# Patient Record
Sex: Male | Born: 1950 | Race: White | Hispanic: No | Marital: Married | Smoking: Former smoker
Health system: Southern US, Community
[De-identification: ages and names within clinical notes are randomized; demographics above are authoritative.]

## PROBLEM LIST (undated history)

## (undated) DIAGNOSIS — M1990 Unspecified osteoarthritis, unspecified site: Secondary | ICD-10-CM

## (undated) DIAGNOSIS — I82409 Acute embolism and thrombosis of unspecified deep veins of unspecified lower extremity: Secondary | ICD-10-CM

## (undated) DIAGNOSIS — G459 Transient cerebral ischemic attack, unspecified: Secondary | ICD-10-CM

## (undated) DIAGNOSIS — G4730 Sleep apnea, unspecified: Secondary | ICD-10-CM

## (undated) DIAGNOSIS — R569 Unspecified convulsions: Secondary | ICD-10-CM

## (undated) DIAGNOSIS — I10 Essential (primary) hypertension: Secondary | ICD-10-CM

## (undated) DIAGNOSIS — J189 Pneumonia, unspecified organism: Secondary | ICD-10-CM

## (undated) DIAGNOSIS — I639 Cerebral infarction, unspecified: Secondary | ICD-10-CM

## (undated) DIAGNOSIS — R55 Syncope and collapse: Secondary | ICD-10-CM

## (undated) DIAGNOSIS — I4892 Unspecified atrial flutter: Secondary | ICD-10-CM

## (undated) DIAGNOSIS — R0600 Dyspnea, unspecified: Secondary | ICD-10-CM

## (undated) DIAGNOSIS — N19 Unspecified kidney failure: Secondary | ICD-10-CM

## (undated) DIAGNOSIS — E7800 Pure hypercholesterolemia, unspecified: Secondary | ICD-10-CM

## (undated) DIAGNOSIS — G629 Polyneuropathy, unspecified: Secondary | ICD-10-CM

## (undated) DIAGNOSIS — R079 Chest pain, unspecified: Secondary | ICD-10-CM

## (undated) DIAGNOSIS — M436 Torticollis: Secondary | ICD-10-CM

## (undated) DIAGNOSIS — J302 Other seasonal allergic rhinitis: Secondary | ICD-10-CM

## (undated) DIAGNOSIS — I2699 Other pulmonary embolism without acute cor pulmonale: Secondary | ICD-10-CM

## (undated) DIAGNOSIS — N400 Enlarged prostate without lower urinary tract symptoms: Secondary | ICD-10-CM

## (undated) DIAGNOSIS — H5440 Blindness, one eye, unspecified eye: Secondary | ICD-10-CM

## (undated) DIAGNOSIS — A058 Other specified bacterial foodborne intoxications: Secondary | ICD-10-CM

## (undated) DIAGNOSIS — G709 Myoneural disorder, unspecified: Secondary | ICD-10-CM

## (undated) DIAGNOSIS — R519 Headache, unspecified: Secondary | ICD-10-CM

## (undated) DIAGNOSIS — K219 Gastro-esophageal reflux disease without esophagitis: Secondary | ICD-10-CM

## (undated) DIAGNOSIS — I5032 Chronic diastolic (congestive) heart failure: Secondary | ICD-10-CM

## (undated) DIAGNOSIS — Z972 Presence of dental prosthetic device (complete) (partial): Secondary | ICD-10-CM

## (undated) DIAGNOSIS — I499 Cardiac arrhythmia, unspecified: Secondary | ICD-10-CM

## (undated) HISTORY — DX: Gastro-esophageal reflux disease without esophagitis: K21.9

## (undated) HISTORY — PX: BACK SURGERY: SHX140

## (undated) HISTORY — DX: Essential (primary) hypertension: I10

## (undated) HISTORY — PX: EYE SURGERY: SHX253

## (undated) HISTORY — DX: Sleep apnea, unspecified: G47.30

## (undated) HISTORY — DX: Polyneuropathy, unspecified: G62.9

## (undated) HISTORY — DX: Acute embolism and thrombosis of unspecified deep veins of unspecified lower extremity: I82.409

## (undated) HISTORY — DX: Chest pain, unspecified: R07.9

## (undated) HISTORY — DX: Benign prostatic hyperplasia without lower urinary tract symptoms: N40.0

## (undated) HISTORY — DX: Other specified bacterial foodborne intoxications: A05.8

## (undated) HISTORY — DX: Syncope and collapse: R55

## (undated) HISTORY — PX: TONSILLECTOMY: SUR1361

## (undated) HISTORY — PX: CARDIOVERSION: SHX1299

## (undated) HISTORY — PX: KNEE ARTHROSCOPY: SUR90

## (undated) HISTORY — PX: APPENDECTOMY: SHX54

## (undated) HISTORY — DX: Unspecified kidney failure: N19

## (undated) HISTORY — DX: Cardiac arrhythmia, unspecified: I49.9

## (undated) HISTORY — DX: Transient cerebral ischemic attack, unspecified: G45.9

## (undated) HISTORY — PX: CHOLECYSTECTOMY: SHX55

## (undated) HISTORY — DX: Other pulmonary embolism without acute cor pulmonale: I26.99

---

## 1998-09-24 ENCOUNTER — Ambulatory Visit (HOSPITAL_COMMUNITY): Admission: RE | Admit: 1998-09-24 | Discharge: 1998-09-24 | Payer: Self-pay | Admitting: Neurosurgery

## 1999-05-27 ENCOUNTER — Encounter: Payer: Self-pay | Admitting: Neurosurgery

## 1999-05-27 ENCOUNTER — Ambulatory Visit (HOSPITAL_COMMUNITY): Admission: RE | Admit: 1999-05-27 | Discharge: 1999-05-27 | Payer: Self-pay | Admitting: Neurosurgery

## 1999-12-28 HISTORY — PX: ROTATOR CUFF REPAIR: SHX139

## 2000-02-18 ENCOUNTER — Encounter: Payer: Self-pay | Admitting: Neurosurgery

## 2000-02-18 ENCOUNTER — Ambulatory Visit (HOSPITAL_COMMUNITY): Admission: RE | Admit: 2000-02-18 | Discharge: 2000-02-18 | Payer: Self-pay | Admitting: Neurosurgery

## 2000-04-26 ENCOUNTER — Encounter: Payer: Self-pay | Admitting: Orthopaedic Surgery

## 2000-04-26 ENCOUNTER — Encounter: Admission: RE | Admit: 2000-04-26 | Discharge: 2000-04-26 | Payer: Self-pay | Admitting: Orthopaedic Surgery

## 2000-08-15 ENCOUNTER — Encounter: Payer: Self-pay | Admitting: Neurosurgery

## 2000-08-17 ENCOUNTER — Encounter: Payer: Self-pay | Admitting: Neurosurgery

## 2000-08-17 ENCOUNTER — Inpatient Hospital Stay (HOSPITAL_COMMUNITY): Admission: RE | Admit: 2000-08-17 | Discharge: 2000-08-20 | Payer: Self-pay | Admitting: Neurosurgery

## 2000-09-01 ENCOUNTER — Encounter: Payer: Self-pay | Admitting: Neurosurgery

## 2000-09-01 ENCOUNTER — Encounter: Admission: RE | Admit: 2000-09-01 | Discharge: 2000-09-01 | Payer: Self-pay | Admitting: Neurosurgery

## 2000-10-17 ENCOUNTER — Encounter: Payer: Self-pay | Admitting: Neurosurgery

## 2000-10-17 ENCOUNTER — Ambulatory Visit (HOSPITAL_COMMUNITY): Admission: RE | Admit: 2000-10-17 | Discharge: 2000-10-17 | Payer: Self-pay | Admitting: Neurosurgery

## 2000-11-08 ENCOUNTER — Encounter: Payer: Self-pay | Admitting: Neurosurgery

## 2000-11-08 ENCOUNTER — Ambulatory Visit (HOSPITAL_COMMUNITY): Admission: RE | Admit: 2000-11-08 | Discharge: 2000-11-08 | Payer: Self-pay | Admitting: Neurosurgery

## 2000-11-25 ENCOUNTER — Encounter: Payer: Self-pay | Admitting: Neurosurgery

## 2000-11-25 ENCOUNTER — Ambulatory Visit (HOSPITAL_COMMUNITY): Admission: RE | Admit: 2000-11-25 | Discharge: 2000-11-25 | Payer: Self-pay | Admitting: Neurosurgery

## 2000-12-27 HISTORY — PX: GALLBLADDER SURGERY: SHX652

## 2004-01-28 ENCOUNTER — Inpatient Hospital Stay (HOSPITAL_BASED_OUTPATIENT_CLINIC_OR_DEPARTMENT_OTHER): Admission: RE | Admit: 2004-01-28 | Discharge: 2004-01-28 | Payer: Self-pay | Admitting: Cardiology

## 2005-02-05 ENCOUNTER — Ambulatory Visit: Payer: Self-pay | Admitting: Unknown Physician Specialty

## 2005-02-12 ENCOUNTER — Encounter: Admission: RE | Admit: 2005-02-12 | Discharge: 2005-02-12 | Payer: Self-pay | Admitting: Neurosurgery

## 2005-05-20 ENCOUNTER — Ambulatory Visit: Payer: Self-pay | Admitting: Nephrology

## 2005-05-28 ENCOUNTER — Encounter: Admission: RE | Admit: 2005-05-28 | Discharge: 2005-05-28 | Payer: Self-pay | Admitting: Neurosurgery

## 2005-09-14 ENCOUNTER — Ambulatory Visit (HOSPITAL_COMMUNITY): Admission: RE | Admit: 2005-09-14 | Discharge: 2005-09-14 | Payer: Self-pay | Admitting: Neurosurgery

## 2006-04-27 ENCOUNTER — Encounter: Payer: Self-pay | Admitting: Neurosurgery

## 2007-07-12 ENCOUNTER — Ambulatory Visit (HOSPITAL_COMMUNITY): Admission: RE | Admit: 2007-07-12 | Discharge: 2007-07-12 | Payer: Self-pay | Admitting: Neurosurgery

## 2007-10-03 ENCOUNTER — Inpatient Hospital Stay (HOSPITAL_COMMUNITY): Admission: RE | Admit: 2007-10-03 | Discharge: 2007-10-11 | Payer: Self-pay | Admitting: Neurosurgery

## 2008-06-27 ENCOUNTER — Encounter: Admission: RE | Admit: 2008-06-27 | Discharge: 2008-06-27 | Payer: Self-pay | Admitting: Neurosurgery

## 2008-09-09 ENCOUNTER — Encounter: Admission: RE | Admit: 2008-09-09 | Discharge: 2008-09-09 | Payer: Self-pay | Admitting: Neurosurgery

## 2008-12-03 ENCOUNTER — Ambulatory Visit: Payer: Self-pay | Admitting: Unknown Physician Specialty

## 2009-05-15 ENCOUNTER — Encounter: Admission: RE | Admit: 2009-05-15 | Discharge: 2009-05-15 | Payer: Self-pay | Admitting: Neurosurgery

## 2009-12-27 DIAGNOSIS — I2699 Other pulmonary embolism without acute cor pulmonale: Secondary | ICD-10-CM

## 2009-12-27 HISTORY — DX: Other pulmonary embolism without acute cor pulmonale: I26.99

## 2010-02-05 ENCOUNTER — Ambulatory Visit (HOSPITAL_COMMUNITY): Admission: RE | Admit: 2010-02-05 | Discharge: 2010-02-05 | Payer: Self-pay | Admitting: Neurosurgery

## 2010-02-09 ENCOUNTER — Ambulatory Visit: Payer: Self-pay | Admitting: Family Medicine

## 2010-02-24 DIAGNOSIS — I82409 Acute embolism and thrombosis of unspecified deep veins of unspecified lower extremity: Secondary | ICD-10-CM

## 2010-02-24 HISTORY — DX: Acute embolism and thrombosis of unspecified deep veins of unspecified lower extremity: I82.409

## 2010-02-27 ENCOUNTER — Inpatient Hospital Stay: Payer: Self-pay | Admitting: Internal Medicine

## 2010-02-27 ENCOUNTER — Ambulatory Visit: Payer: Self-pay | Admitting: Cardiovascular Disease

## 2010-03-06 ENCOUNTER — Other Ambulatory Visit: Payer: Self-pay | Admitting: Family Medicine

## 2010-03-11 ENCOUNTER — Inpatient Hospital Stay: Payer: Self-pay | Admitting: Internal Medicine

## 2011-03-12 ENCOUNTER — Ambulatory Visit: Payer: Self-pay | Admitting: Specialist

## 2011-03-12 ENCOUNTER — Encounter: Payer: Self-pay | Admitting: Cardiovascular Disease

## 2011-03-15 ENCOUNTER — Encounter: Payer: Self-pay | Admitting: Cardiovascular Disease

## 2011-03-15 ENCOUNTER — Ambulatory Visit (INDEPENDENT_AMBULATORY_CARE_PROVIDER_SITE_OTHER): Payer: Medicare Other | Admitting: Cardiovascular Disease

## 2011-03-15 ENCOUNTER — Ambulatory Visit: Payer: Federal, State, Local not specified - PPO | Admitting: Cardiovascular Disease

## 2011-03-15 DIAGNOSIS — E1165 Type 2 diabetes mellitus with hyperglycemia: Secondary | ICD-10-CM | POA: Insufficient documentation

## 2011-03-15 DIAGNOSIS — I82409 Acute embolism and thrombosis of unspecified deep veins of unspecified lower extremity: Secondary | ICD-10-CM | POA: Insufficient documentation

## 2011-03-15 DIAGNOSIS — R0989 Other specified symptoms and signs involving the circulatory and respiratory systems: Secondary | ICD-10-CM | POA: Insufficient documentation

## 2011-03-15 DIAGNOSIS — I10 Essential (primary) hypertension: Secondary | ICD-10-CM | POA: Insufficient documentation

## 2011-03-15 DIAGNOSIS — E119 Type 2 diabetes mellitus without complications: Secondary | ICD-10-CM

## 2011-03-15 DIAGNOSIS — E118 Type 2 diabetes mellitus with unspecified complications: Secondary | ICD-10-CM

## 2011-03-15 DIAGNOSIS — I8010 Phlebitis and thrombophlebitis of unspecified femoral vein: Secondary | ICD-10-CM

## 2011-03-15 DIAGNOSIS — IMO0002 Reserved for concepts with insufficient information to code with codable children: Secondary | ICD-10-CM | POA: Insufficient documentation

## 2011-03-17 ENCOUNTER — Telehealth: Payer: Self-pay | Admitting: Cardiovascular Disease

## 2011-03-17 NOTE — Telephone Encounter (Signed)
Called and spoke to pt's wife. Pt is also taking Losartan/HCTZ 100-25mg  qd and Lisinopril 40mg  qd along with the new med we started Metoprolol 50mg  1/2 tablet (1st 2 days) then whole tablet. Per Dr. Mariah Milling, instructed that pt HOLD Lisinopril and Losartan-HCTZ and continue the Metoprolol. Advised he take 1/2 tablet tomorrow AM and if his BP remains stable to incr to 1 tablet. Informed pt's wife that Dr. Mariah Milling think pt could be in a-flutter with elevated HR and he strongly recommends we try to get to Metoprolol 50mg  bid dose. Pt's wife understands and she will have pt do the above recommendation. Pt has knee surgery this Friday, he will call us tomorrow to notify us of status.

## 2011-03-17 NOTE — Telephone Encounter (Signed)
Pt started a new Beta blocker after last appt with Gollan.  BP last night was 89/43 with a HR of 86.  Pt was nearly faint.  Did not take Beta blocker this am and BP was 107/66 with a HR of 110.

## 2011-03-19 ENCOUNTER — Ambulatory Visit: Payer: Self-pay | Admitting: Specialist

## 2011-03-19 LAB — CBC
HCT: 44.9 % (ref 39.0–52.0)
Hemoglobin: 15.3 g/dL (ref 13.0–17.0)
MCHC: 34.1 g/dL (ref 30.0–36.0)
MCV: 85.6 fL (ref 78.0–100.0)
Platelets: 185 10*3/uL (ref 150–400)
RBC: 5.25 MIL/uL (ref 4.22–5.81)
RDW: 14 % (ref 11.5–15.5)
WBC: 7.4 10*3/uL (ref 4.0–10.5)

## 2011-03-19 LAB — COMPREHENSIVE METABOLIC PANEL
ALT: 36 U/L (ref 0–53)
AST: 22 U/L (ref 0–37)
Albumin: 4.2 g/dL (ref 3.5–5.2)
Alkaline Phosphatase: 110 U/L (ref 39–117)
BUN: 22 mg/dL (ref 6–23)
CO2: 32 mEq/L (ref 19–32)
Calcium: 9.1 mg/dL (ref 8.4–10.5)
Chloride: 93 mEq/L — ABNORMAL LOW (ref 96–112)
Creatinine, Ser: 1.4 mg/dL (ref 0.4–1.5)
GFR calc Af Amer: >60 mL/min (ref >60)
GFR calc non Af Amer: 52 mL/min — ABNORMAL LOW (ref >60)
Glucose, Bld: 377 mg/dL — ABNORMAL HIGH (ref 70–99)
Potassium: 4.2 mEq/L (ref 3.5–5.1)
Sodium: 133 mEq/L — ABNORMAL LOW (ref 135–145)
Total Bilirubin: 0.7 mg/dL (ref 0.3–1.2)
Total Protein: 7.2 g/dL (ref 6.0–8.3)

## 2011-03-19 LAB — URINALYSIS, ROUTINE W REFLEX MICROSCOPIC
Bilirubin Urine: NEGATIVE
Glucose, UA: >1000 mg/dL — AB
Hgb urine dipstick: NEGATIVE
Ketones, ur: NEGATIVE mg/dL
Leukocytes, UA: NEGATIVE
Nitrite: NEGATIVE
Protein, ur: NEGATIVE mg/dL
Specific Gravity, Urine: 1.01 (ref 1.005–1.030)
Urobilinogen, UA: 0.2 mg/dL (ref 0.0–1.0)
pH: 6 (ref 5.0–8.0)

## 2011-03-19 LAB — DIFFERENTIAL
Basophils Absolute: 0 10*3/uL (ref 0.0–0.1)
Basophils Relative: 1 % (ref 0–1)
Eosinophils Absolute: 0.2 10*3/uL (ref 0.0–0.7)
Eosinophils Relative: 3 % (ref 0–5)
Lymphocytes Relative: 23 % (ref 12–46)
Lymphs Abs: 1.7 10*3/uL (ref 0.7–4.0)
Monocytes Absolute: 0.6 10*3/uL (ref 0.1–1.0)
Monocytes Relative: 8 % (ref 3–12)
Neutro Abs: 4.8 10*3/uL (ref 1.7–7.7)
Neutrophils Relative %: 65 % (ref 43–77)

## 2011-03-19 LAB — PROTIME-INR
INR: 0.96 (ref 0.00–1.49)
Prothrombin Time: 12.7 seconds (ref 11.6–15.2)

## 2011-03-19 LAB — APTT: aPTT: 31 seconds (ref 24–37)

## 2011-03-19 LAB — URINE MICROSCOPIC-ADD ON

## 2011-03-25 NOTE — Assessment & Plan Note (Signed)
Summary: Per [name]/Pre-op;Sinus Tachycardia/Referred by Audrea Muscat...   Visit Type:  Initial Consult Primary Provider:  Dr. Sullivan Lone  CC:  Went for pre op for orthoscopic knee surgery and EKG was abnormal.  Denies chest pain.  [name]  History of Present Illness: Jerry Parsons is a very pleasant 60 year old gentleman with a history of multiple back surgeries, DVT, PE in March 2011 at that time with normal echocardiogram, hypertension, depression, diabetes and history of renal disease who presents for preoperative evaluation by referral from Dr. Sullivan Lone for knee arthroscopic surgery.  He reports no significant symptoms of shortness of breath, chest pain, lightheadedness. He is active though is limited by his back and hip pain when he walks too far. He is able to go shopping, do his ADLs without any difficulty. His weight has been increasing, up 20 pounds from last year. He does have trace edema in his legs though he reports this is minimal.  he reports having a significant cardiac workup in the 1990s. He had a cardiac catheterization. His wife reports he had minimal blockage.  He does report chronic sweating at nighttime which he attributes to the Vicodin.  Previous DVT last year, he did have shortness of breath. He denies shortness of breath currently.  Old echocardiogram March 2011 shows normal LV and RV systolic function, mild LVH, diastolic dysfunction, mildly dilated left atrium.  Preventive Screening-Counseling & Management  Alcohol-Tobacco     Smoking Status: quit  Caffeine-Diet-Exercise     Does Patient Exercise: no  Current Medications (verified): 1)  Nexium 40 Mg Pack (Esomeprazole Magnesium) .... One Tablet Two Times A Day 2)  Diazepam 2 Mg Tabs (Diazepam) .... Three Times A Day 3)  Fexofenadine Hcl 180 Mg Tabs (Fexofenadine Hcl) .... One Tablet Once Daily 4)  Januvia 100 Mg Tabs (Sitagliptin Phosphate) .... One Tablet Once Daily 5)  Avodart 0.5 Mg Caps (Dutasteride) .... One  Tablet Once Daily 6)  Oxycontin 40 Mg Xr12h-Tab (Oxycodone Hcl) .... Three Times A Day 7)  Aspir-Low 81 Mg Tbec (Aspirin) .... One Tablet Once Daily 8)  Actoplus Met 15-850 Mg Tabs (Pioglitazone Hcl-Metformin Hcl) .... Two Times A Day 9)  Glimepiride 2 Mg Tabs (Glimepiride) .... One Tablet Once Daily 10)  Gabapentin 300 Mg Caps (Gabapentin) .... Six Tablets Once Daily 11)  Lisinopril 40 Mg Tabs (Lisinopril) .... One Tablet Once Daily 12)  Losartan Potassium-Hctz 100-25 Mg Tabs (Losartan Potassium-Hctz) .... One Tablet Once Daily 13)  Ranitidine Hcl 300 Mg Caps (Ranitidine Hcl) .... One Tablet Once Daily 14)  Cyclobenzaprine Hcl 10 Mg Tabs (Cyclobenzaprine Hcl) .... Three Times A Day  Allergies (verified): No Known Drug Allergies  Past History:  Family History: Last updated: Apr 05, 2011 Father: Deceased; unknown Mother: Deceased; kidney cancer  Social History: Last updated: 05-Apr-2011 Retired  Married  Tobacco Use - Former.  smoked x 1 PPD x 25 years. Alcohol Use - no Regular Exercise - no-- chronic back pain  Risk Factors: Exercise: no (04-05-11)  Risk Factors: Smoking Status: quit (04-05-11)  Past Medical History: DVT March 201 pulmonary embolus  March 2011 pneumonia 2011 kidney failure 2011 diabetes hypertenion sleep apnea neuropathy GERD Depression BPH  Past Surgical History: gallbladder surgery 2002 Torn rotator cuff repair 2001 back surgery x 8  (upper x 3 & lower x 5)  Family History: Father: Deceased; unknown Mother: Deceased; kidney cancer  Social History: Retired  Married  Tobacco Use - Former.  smoked x 1 PPD x 25 years. Alcohol Use - no Regular Exercise -  no-- chronic back pain Smoking Status:  quit Does Patient Exercise:  no  Review of Systems       The patient complains of difficulty walking.  The patient denies fever, weight loss, weight gain, vision loss, decreased hearing, hoarseness, chest pain, syncope, dyspnea on exertion,  peripheral edema, prolonged cough, abdominal pain, incontinence, muscle weakness, depression, and enlarged lymph nodes.         back, knee and hip pain with walking  Vital Signs:  Patient profile:   60 year old male Height:      70 inches Weight:      286 pounds BMI:     41.19 Pulse rate:   117 / minute BP sitting:   158 / 103  (left arm) Cuff size:   large  Vitals Entered By: Bishop Dublin, CMA (March 15, 2011 12:02 PM)  Physical Exam  General:  Well developed, well nourished, in no acute distress. Head:  normocephalic and atraumatic Neck:  Neck supple, no JVD. No masses, thyromegaly or abnormal cervical nodes. Lungs:  Clear bilaterally to auscultation and percussion. Heart:  Non-displaced PMI, chest non-tender; regular rate and rhythm, S1, S2 , tachycardic, without murmurs, rubs or gallops. Carotid upstroke normal, no bruit.  Pedals normal pulses. No edema, no varicosities. Abdomen:  Bowel sounds positive; abdomen soft and non-tender without masses, mild obesity Msk:  Back normal, normal gait. Muscle strength and tone normal. Pulses:  pulses normal in all 4 extremities Extremities:  No clubbing or cyanosis. Neurologic:  Alert and oriented x 3. Skin:  Intact without lesions or rashes. Psych:  Normal affect.   Impression & Recommendations:  Problem # 1:  TACHYCARDIA (ICD-785) etiology of his tachycardia is uncertain. He certainly has had numerous surgeries to his lower back in the lumbar region as well as cervical spine. Uncertain if this is playing a role. We will try to obtain heart rate and vitals measurements over the past year from Dr. Sullivan Lone. We will start him on metoprolol tartrate 25 mg b.i.d. titrating to 50 mg b.i.d. for his surgery this coming Friday. we did mention that we would try to repeat the echocardiogram though uncertain if a slot is available in clinic tomorrow. This should not delay his surgery if we're unable to obtain this in time for surgery later this  week.  As he is asymptomatic with no known coronary artery disease, with previous echocardiogram last year which was essentially normal, he would be an acceptable risk for surgery this week with Dr. Katrinka Blazing.  Problem # 2:  HYPERTENSION, BENIGN (ICD-401.1) blood pressure is mildly elevated. We are starting metoprolol tartrate. Would continue his other current medications. If his blood pressure drops low, we could decrease the lisinopril dose.  His updated medication list for this problem includes:    Aspir-low 81 Mg Tbec (Aspirin) ..... One tablet once daily    Lisinopril 40 Mg Tabs (Lisinopril) ..... One tablet once daily    Losartan Potassium-hctz 100-25 Mg Tabs (Losartan potassium-hctz) ..... One tablet once daily    Metoprolol Tartrate 50 Mg Tabs (Metoprolol tartrate) .[name]... Take one tablet by mouth twice a day  Problem # 3:  DVT (ICD-453.40) He denies any leg swelling, no shortness of breath. Risk of DVT is low. We will try to obtain the most recent blood work from Dr. Sullivan Lone. Uncertain if a d-dimer was checked.  Problem # 4:  DM (ICD-250.00) would continue aggressive medical regimen for his diabetes. Encourage weight loss. He has had 20 pound weight  gain over the past year.  His updated medication list for this problem includes:    Januvia 100 Mg Tabs (Sitagliptin phosphate) ..... One tablet once daily    Aspir-low 81 Mg Tbec (Aspirin) ..... One tablet once daily    Actoplus Met 15-850 Mg Tabs (Pioglitazone hcl-metformin hcl) .[name]..[name] Two times a day    Glimepiride 2 Mg Tabs (Glimepiride) ..... One tablet once daily    Lisinopril 40 Mg Tabs (Lisinopril) ..... One tablet once daily    Losartan Potassium-hctz 100-25 Mg Tabs (Losartan potassium-hctz) ..... One tablet once daily  Patient Instructions: 1)  Your physician recommends that you schedule a follow-up appointment in: 1 month 2)  Your physician has recommended you make the following change in your medication: START Metoprolol  Tartrate 50mg  two times a day. FOR THE FIRST 2 DAYS, TAKE ONLY 1/2 TABLET two times a day. Prescriptions: METOPROLOL TARTRATE 50 MG TABS (METOPROLOL TARTRATE) Take one tablet by mouth twice a day  #60 x 6   Entered by:   Lanny Hurst RN   Authorized by:   Dossie Arbour MD   Signed by:   Lanny Hurst RN on 03/15/2011   Method used:   Electronically to        [facility] (retail)       [address]       [place], [place]  [address]       Ph: [phone]       Fax: [phone]   RxID:   [id number]   Appended Document: Per Madolin Twaddle/Pre-op;Sinus Tachycardia/Referred by Audrea Muscat... EKG was closely examined and showed an atrial tachycardia with ventricular rate 117 beats per minute, no significant ST changes. T-Wave abnormality in aVL.  Previous EKG dated March 16 showed atrial tachycardia with ventricular rate 118 beats per minute  These EKGs were discussed with Dr. Berton Mount to exclude an atrial flutter. Conclusion was that this was most likely an atrial tachycardia though repeat echocardiogram and followup was needed.

## 2011-03-31 ENCOUNTER — Ambulatory Visit: Payer: Federal, State, Local not specified - PPO | Admitting: Cardiovascular Disease

## 2011-03-31 ENCOUNTER — Other Ambulatory Visit: Payer: Self-pay | Admitting: Cardiovascular Disease

## 2011-03-31 DIAGNOSIS — R000 Tachycardia, unspecified: Secondary | ICD-10-CM

## 2011-04-01 ENCOUNTER — Other Ambulatory Visit (INDEPENDENT_AMBULATORY_CARE_PROVIDER_SITE_OTHER): Payer: Medicare Other | Admitting: *Deleted

## 2011-04-01 ENCOUNTER — Other Ambulatory Visit: Payer: Self-pay | Admitting: *Deleted

## 2011-04-01 DIAGNOSIS — R000 Tachycardia, unspecified: Secondary | ICD-10-CM

## 2011-04-01 DIAGNOSIS — R0602 Shortness of breath: Secondary | ICD-10-CM

## 2011-04-06 ENCOUNTER — Ambulatory Visit: Payer: Federal, State, Local not specified - PPO | Admitting: Cardiovascular Disease

## 2011-04-12 ENCOUNTER — Ambulatory Visit (INDEPENDENT_AMBULATORY_CARE_PROVIDER_SITE_OTHER): Payer: Federal, State, Local not specified - PPO | Admitting: Cardiovascular Disease

## 2011-04-12 ENCOUNTER — Encounter: Payer: Self-pay | Admitting: Cardiovascular Disease

## 2011-04-12 DIAGNOSIS — I5189 Other ill-defined heart diseases: Secondary | ICD-10-CM | POA: Insufficient documentation

## 2011-04-12 DIAGNOSIS — I519 Heart disease, unspecified: Secondary | ICD-10-CM

## 2011-04-12 DIAGNOSIS — R0989 Other specified symptoms and signs involving the circulatory and respiratory systems: Secondary | ICD-10-CM

## 2011-04-12 DIAGNOSIS — I10 Essential (primary) hypertension: Secondary | ICD-10-CM

## 2011-04-12 DIAGNOSIS — R0602 Shortness of breath: Secondary | ICD-10-CM

## 2011-04-12 DIAGNOSIS — I82409 Acute embolism and thrombosis of unspecified deep veins of unspecified lower extremity: Secondary | ICD-10-CM

## 2011-04-12 DIAGNOSIS — E119 Type 2 diabetes mellitus without complications: Secondary | ICD-10-CM

## 2011-04-12 MED ORDER — METOPROLOL TARTRATE 25 MG PO TABS: 25.0000 mg | ORAL_TABLET | Freq: Two times a day (BID) | ORAL | Status: DC

## 2011-04-12 MED ORDER — HYDROCHLOROTHIAZIDE 25 MG PO TABS: 25.0000 mg | ORAL_TABLET | Freq: Every day | ORAL | Status: DC

## 2011-04-12 NOTE — Assessment & Plan Note (Signed)
He has diastolic dysfunction on echocardiogram, otherwise echo was normal. I suspect his diastolic dysfunction could be contributing to his fluid retention. He does have Lasix that he takes p.r.n. We have suggested that he try 40 mg p.r.n. Until his breathing improves and his weight also decreases in his edema resolves.

## 2011-04-12 NOTE — Progress Notes (Signed)
   Patient ID: Jerry Parsons, male    DOB: 10/13/1951, 60 y.o.   MRN: [id number]  HPI Comments: [name] is a very pleasant 60 year old gentleman, patient of Dr. Sullivan Lone, with a history of multiple back surgeries, DVT, PE in March 2011, hypertension, depression, diabetes and history of renal disease, s/p arthroscopic knee surgery, seen Recently for preoperative evaluation, found to have tachycardia. He was started on metoprolol tartrate, titrated to 50 mg b.i.d. And he presents for routine followup.  He reports that he has had increasing shortness of breath over the past several weeks. He has had mild increased swelling in his legs and weight gain. He drinks a significant amount of water daily during the day as well as nighttime. He goes out to eat several times a week with his wife. He denies any chest pain or PND.   he reports having a significant cardiac workup in the 1990s. He had a cardiac catheterization. His wife reports he had minimal blockage.   He does report chronic sweating at nighttime which he attributes to the Vicodin.   Previous DVT last year, he did have shortness of breath. He denies shortness of breath currently.   Old echocardiogram March 2011 shows normal LV and RV systolic function, mild LVH, diastolic dysfunction, mildly dilated left atrium.  EKG shows normal sinus rhythm with rate 78 beats per minute with no significant ST or T wave changes     Review of Systems  Constitutional: Positive for unexpected weight change.  HENT: Negative.   Eyes: Negative.   Respiratory: Positive for shortness of breath.   Cardiovascular: Positive for leg swelling.  Gastrointestinal: Negative.   Musculoskeletal: Positive for back pain and arthralgias.  Skin: Negative.   Neurological: Negative.   Hematological: Negative.   Psychiatric/Behavioral: Negative.   All other systems reviewed and are negative.   BP 160/80  Pulse 78  Ht 5\' 10"  (1.778 m)  Wt 297 lb (134.718 kg)  BMI  42.61 kg/m2   Physical Exam  Nursing note and vitals reviewed. Constitutional: He is oriented to person, place, and time. He appears well-developed and well-nourished.       Obese  HENT:  Head: Normocephalic.  Nose: Nose normal.  Mouth/Throat: Oropharynx is clear and moist.  Eyes: Conjunctivae are normal. Pupils are equal, round, and reactive to light.  Neck: Normal range of motion. Neck supple. No JVD present.  Cardiovascular: Normal rate, regular rhythm, S1 normal, S2 normal, normal heart sounds and intact distal pulses.  Exam reveals no gallop and no friction rub.   No murmur heard. Pulmonary/Chest: Effort normal and breath sounds normal. No respiratory distress. He has no wheezes. He has no rales. He exhibits no tenderness.  Abdominal: Soft. Bowel sounds are normal. He exhibits no distension. There is no tenderness.  Musculoskeletal: Normal range of motion. He exhibits edema. He exhibits no tenderness.  Lymphadenopathy:    He has no cervical adenopathy.  Neurological: He is alert and oriented to person, place, and time. Coordination normal.  Skin: Skin is warm and dry. No rash noted. No erythema.  Psychiatric: He has a normal mood and affect. His behavior is normal. Judgment and thought content normal.           Assessment and Plan

## 2011-04-12 NOTE — Assessment & Plan Note (Signed)
We will start HCTZ 25 mg for systolic pressure greater than 140. He may end up needing this daily I've asked him to check his pressure regularly.

## 2011-04-12 NOTE — Assessment & Plan Note (Signed)
I do not think that his recent shortness of breath is secondary to a new DVT and PE as he does have clinical signs of heart failure. We will watch him closely in case he does not have any improvement.

## 2011-04-12 NOTE — Assessment & Plan Note (Signed)
I suspect that he received IV fluids during his orthoscopic knee surgery. He drinks a significant amount of fluid, eats out frequently. Most likely, this is an exacerbation of his diastolic dysfunction and has mild heart failure.

## 2011-04-12 NOTE — Assessment & Plan Note (Signed)
We have encouraged him to watch his diet, decrease the number of times that he eats out at restaurants.   

## 2011-04-12 NOTE — Patient Instructions (Addendum)
Please take lasix 40 mg in the AM as needed for edema or shortness of breath. Take HCTZ for high blood pressure. You can also take HCTZ 30 minutes before lasix for shortness of breath and edema. Decrease fluid intake. Watch your weight and call the office if it does not trend downwards. Follow up in one month: 05/12/11 @ 11:45am

## 2011-04-12 NOTE — Assessment & Plan Note (Signed)
His tachycardia has improved on metoprolol. He does report having fluid retention, fatigue and shortness of breath. I am uncertain if this is the medication or fluid overload. We'll decrease the metoprolol to 25 mg b.i.d..

## 2011-04-15 ENCOUNTER — Encounter: Payer: Self-pay | Admitting: Cardiovascular Disease

## 2011-04-15 ENCOUNTER — Ambulatory Visit: Payer: Federal, State, Local not specified - PPO | Admitting: Cardiovascular Disease
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
## 2011-05-11 NOTE — Discharge Summary (Signed)
NAME:  Jerry, BEVILLE NO.:  [id number]   MEDICAL RECORD NO.:  [id number]          PATIENT TYPE:  INP   LOCATION:  3036                         FACILITY:  [facility]   PHYSICIAN:  Payton Doughty, M.D.      DATE OF BIRTH:  [birth year]   DATE OF ADMISSION:  10/03/2007  DATE OF DISCHARGE:  10/11/2007                               DISCHARGE SUMMARY   ADMITTING DIAGNOSIS/PROBLEM LIST:  L3-4.   DISCHARGE DIAGNOSES:  L3-4.   PROCEDURE:  L3-4 fusion.   SURGEON:  Trey Sailors, M.D.   SERVICE:  Neurosurgery.   COMPLICATIONS:  None.   DISCHARGE STATUS:  Alive and well.   BODY OF TEXT:  A 60 year old gentleman whose history and physical is  recounted on the chart.  He is being fused at 4-5 and 5-1.  He has  developed spondylosis at 3-4.  His MR shows significant stenosis.  He is  now admitted for fusion.  After ascertaining normal laboratory values,  he went to the OR and underwent a 3-4 fusion with Ray cages and pedicle  screws.  Postoperatively, he has done reasonable well.  He was on  OxyContin prior to his operation and so pain control was significant  issue.  As his pain control improved with PCA and remaining on his  OxyContin, his level of activity increased.  Addition of Lyrica also  helped his pain control.  He did have a CSF leak that was repaired  intraoperatively.  He was kept down for a day for that and has had no  headache.  Currently, he is awake and alert with a mild amount of left  hip pain.  His incision is dry and well healing.  His strength is full  and his vital signs are stable.  He is being discharged home on Lyrica,  Percocet, OxyContin.  His followup with be in the [facility] offices in  about week for suture removal.           ______________________________  Payton Doughty, M.D.     MWR/MEDQ  D:  10/11/2007  T:  10/11/2007  Job:  [id number]

## 2011-05-11 NOTE — Op Note (Signed)
NAME:  Jerry Parsons, Jerry Parsons NO.:  [id number]   MEDICAL RECORD NO.:  [id number]          PATIENT TYPE:  INP   LOCATION:  3172                         FACILITY:  [facility]   PHYSICIAN:  Payton Doughty, M.D.      DATE OF BIRTH:  May 13, 1951   DATE OF PROCEDURE:  10/03/2007  DATE OF DISCHARGE:                               OPERATIVE REPORT   PREOPERATIVE DIAGNOSIS:  Spondylosis L3-4.   POSTOPERATIVE DIAGNOSIS:  Spondylosis L3-4.   PROCEDURE:  L3-4 laminectomy, diskectomy, posterior lumbar interbody  fusion with Ray threaded fusion cages, nonsegmental pedicle screw  fixation, and posterolateral arthrodesis.   SURGEON:  Payton Doughty, M.D.   ANESTHESIA:  General endotracheal.   PREP:  Sterile Betadine prep and scrub with alcohol wipe.   COMPLICATIONS:  Dural tear which was primarily repaired.   NURSE ASSISTANT:  [place].   DOCTOR ASSISTANT:  Hewitt Shorts, M.D.   BODY OF TEXT:  60 year old gentleman with severe spondylosis.  He had a  prior fusion at 4-5 and 5-1.  Taken to the operating room, smoothly  anesthetized and intubated, placed prone on the operating table.  Following shave, prep, and drape in the usual sterile fashion, the skin  was infiltrated with 1% lidocaine with 1:400,000 epinephrine.  The skin  was incised from the bottom of L2 down to the mid L4.  The  lamina of L2  and L3 and the top of L4 were exposed bilaterally in the subperiosteal  plane.  Intraoperative x-ray confirmed correctness of level.  Having  confirmed correctness of the level, the pars interarticularis lamina and  inferior facet of L3, and the superior facet of L4 were removed  bilaterally. The left side was without difficulty.  On the right side,  the dura was entered.  There had been an erosion through the ligamentum  flavum.  No ligamentum flavum was there, and the dura was entered  directly from the bone.  The lamina was removed.  The dural injury  isolated and closed primarily with  6-0 Prolene.  It was watertight to  Valsalva.  Decompression of the 3 and 4 roots was accomplished.  Diskectomy was carried out at L3-4, and then a 14 x 20-mm Ray threaded  fusion cages were placed.  They were packed with bone graft harvested  from the facet joints.  Pedicle screws were placed in L3 and L4 using  the standard landmarks.  Intraoperative x-ray showed good placement of  the screws.  They were connected by the rods.  The transverse processes  were decorticated with a high-speed drill and BMP on the extender matrix  was laid across them.  The rods were tightened down with the locking  caps.  Intraoperative x-ray showed good placement of the screws, rods,  and  Ray cages.  Successive layers of 0 Vicryl, 2-0 Vicryl, and 3-0 nylon  were used to close.  Betadine and Telfa dressing was applied and made  occlusive with OpSite.   The patient returned to the recovery room in good condition.  ______________________________  Payton Doughty, M.D.     MWR/MEDQ  D:  10/03/2007  T:  10/03/2007  Job:  [id number]

## 2011-05-12 ENCOUNTER — Encounter: Payer: Self-pay | Admitting: Cardiovascular Disease

## 2011-05-12 ENCOUNTER — Ambulatory Visit (INDEPENDENT_AMBULATORY_CARE_PROVIDER_SITE_OTHER): Payer: Federal, State, Local not specified - PPO | Admitting: Cardiovascular Disease

## 2011-05-12 DIAGNOSIS — R000 Tachycardia, unspecified: Secondary | ICD-10-CM

## 2011-05-12 DIAGNOSIS — I5189 Other ill-defined heart diseases: Secondary | ICD-10-CM

## 2011-05-12 DIAGNOSIS — R0989 Other specified symptoms and signs involving the circulatory and respiratory systems: Secondary | ICD-10-CM

## 2011-05-12 DIAGNOSIS — I519 Heart disease, unspecified: Secondary | ICD-10-CM

## 2011-05-12 DIAGNOSIS — R0602 Shortness of breath: Secondary | ICD-10-CM

## 2011-05-12 DIAGNOSIS — I10 Essential (primary) hypertension: Secondary | ICD-10-CM

## 2011-05-12 DIAGNOSIS — E119 Type 2 diabetes mellitus without complications: Secondary | ICD-10-CM

## 2011-05-12 NOTE — Progress Notes (Signed)
   Patient ID: Jerry Parsons, male    DOB: 1951-10-02, 60 y.o.   MRN: [id number]  HPI Comments: Jerry Parsons is a very pleasant 60 year old gentleman, patient of Dr. Sullivan Lone, with a history of multiple back surgeries, DVT, PE in March 2011, hypertension, depression, diabetes and history of renal disease, s/p arthroscopic knee surgery, seen Recently for preoperative evaluation, found to have tachycardia. He was started on metoprolol tartrate 50 mg b.i.d, decreased to 25 mg b.i.d.. And he presents for routine followup.  He reports that he is doing much better. He has less shortness of breath, less edema. He is more energy though does have insomnia which she believes is secondary to the metoprolol. He is awake at night for nights in a row And feels very frustrated that he cannot sleep. He would like to change the metoprolol.   he reports having a significant cardiac workup in the 1990s. He had a cardiac catheterization. His wife reports he had minimal blockage.   He does report chronic sweating at nighttime which he attributes to the Vicodin.   Previous DVT last year, he did have shortness of breath. He denies shortness of breath currently.   Old echocardiogram March 2011 shows normal LV and RV systolic function, mild LVH, diastolic dysfunction, mildly dilated left atrium.  EKG shows normal sinus rhythm with rate 75 beats per minute with no significant ST or T wave changes      Review of Systems  Constitutional:       Insomnia  HENT: Negative.   Eyes: Negative.   Respiratory: Negative.   Cardiovascular: Negative.   Gastrointestinal: Negative.   Musculoskeletal: Negative.   Skin: Negative.   Neurological: Negative.   Hematological: Negative.   Psychiatric/Behavioral: Negative.   All other systems reviewed and are negative.    BP 122/80  Pulse 74  Ht 5\' 11"  (1.803 m)  Wt 281 lb (127.461 kg)  BMI 39.19 kg/m2   Physical Exam  Nursing note and vitals reviewed. Constitutional: He  is oriented to person, place, and time. He appears well-developed and well-nourished.  HENT:  Head: Normocephalic.  Nose: Nose normal.  Mouth/Throat: Oropharynx is clear and moist.  Eyes: Conjunctivae are normal. Pupils are equal, round, and reactive to light.  Neck: Normal range of motion. Neck supple. No JVD present.  Cardiovascular: Normal rate, regular rhythm, S1 normal, S2 normal, normal heart sounds and intact distal pulses.  Exam reveals no gallop and no friction rub.   No murmur heard. Pulmonary/Chest: Effort normal and breath sounds normal. No respiratory distress. He has no wheezes. He has no rales. He exhibits no tenderness.  Abdominal: Soft. Bowel sounds are normal. He exhibits no distension. There is no tenderness.  Musculoskeletal: Normal range of motion. He exhibits no edema and no tenderness.  Lymphadenopathy:    He has no cervical adenopathy.  Neurological: He is alert and oriented to person, place, and time. Coordination normal.  Skin: Skin is warm and dry. No rash noted. No erythema.  Psychiatric: He has a normal mood and affect. His behavior is normal. Judgment and thought content normal.           Assessment and Plan

## 2011-05-12 NOTE — Patient Instructions (Signed)
Decrease Metoprolol Tartrate to 1 tablet in AM and 1/2 tablet in PM Your physician recommends that you schedule a follow-up appointment in: 6 months

## 2011-05-12 NOTE — Assessment & Plan Note (Signed)
We have encouraged him to watch his diet, decrease the number of times that he eats out at restaurants.

## 2011-05-12 NOTE — Assessment & Plan Note (Signed)
He has diastolic dysfunction on echocardiogram, otherwise echo was normal. Continue lasix PRN.

## 2011-05-12 NOTE — Assessment & Plan Note (Signed)
Blood pressure is well controlled on today's visit. No changes made to the medications. 

## 2011-05-12 NOTE — Assessment & Plan Note (Signed)
His tachycardia has improved on  metoprolol  25 mg b.i.d.. He reports insomnia with metoprolol. We will try 1/2 tab of metoprolol in the PM. If this does not work, we will change him to metoprolol succinate.

## 2011-05-12 NOTE — Assessment & Plan Note (Signed)
Symptoms have improved.  He drinks a significant amount of fluid, eats out frequently. He does have diastolic dysfunction and I have asked him to take lasix for weigh gain and SOB. He wants to continue on HCTZ 25 mg x 2 daily.

## 2011-05-14 NOTE — Discharge Summary (Signed)
[place]. [facility]  Patient:    Jerry Parsons, Jerry Parsons                       MRN: [id number] Adm. Date:  [id number] Disc. 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  SERVICE:  Neurosurgery.  PROCEDURE:  Anterior cervicectomy and fusion at C6-C7.  COMPLICATIONS:  None.  DISCHARGE STATUS:  Alive and well.  HISTORY OF PRESENT ILLNESS:  This is a 60 year old right-handed white gentleman whose history and physical is recounted in the chart. He has had a fusion at 3-4, 4-5, 5-6 numerous years ago.  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  PAST MEDICAL HISTORY:  This is remarkable for 4-5 and 5-1 lumbar fusion and hypertension.  MEDICATIONS:  He is on Accupril, Vioxx, Protonix, Allegra, _________, Reglan, OxyContin, and Flonase.  ALLERGIES:  No known drug allergies.  PHYSICAL EXAMINATION:  His general x-rayamination is unremarkable.  Neurologic ex shows cervical myelopathy.  HOSPITAL COURSE:  He was admitted after ascertaining normal laboratory values and underwent an anterior cervicotomy and fusion at the C6-C7 with tether plate.  Postoperatively he has done well.  His incision is well healing.  He had some episodes of dysphagia which resolved spontaneously. There was no swelling in his neck.  He was kept an extra day because of concerns about his airway and for pain control.  He is back on his OxyContin regimen now with intact strength, eating and voiding normally.  He is being discharged to home in the care of his family.  FOLLOW-UP:  He will follow up in the [facility] office in about two weeks for a lateral C-spine x-ray. DD:  08/20/00 TD:  08/22/00 Job: [id number]

## 2011-05-14 NOTE — Cardiovascular Report (Signed)
NAME:  Jerry Parsons, Jerry Parsons                          ACCOUNT NO.:  [id number]   MEDICAL RECORD NO.:  [id number]                   PATIENT TYPE:  OIB   LOCATION:  6501                                 FACILITY:  [facility]   PHYSICIAN:  Veneda Melter, M.D.                   DATE OF BIRTH:  29-Dec-1950   DATE OF PROCEDURE:  01/28/2004  DATE OF DISCHARGE:                              CARDIAC CATHETERIZATION   PROCEDURES PERFORMED:  1. Left heart catheterization.  2. Left ventriculogram.  3. Selective coronary angiography.   DIAGNOSES:  1. Mild coronary artery disease by angiogram.  2. Normal left ventricular size and function.   HISTORY:  [name] is a 60 year old gentleman with a history of tobacco  and alcohol use and diabetes mellitus who has had some chest discomfort.  The patient under stress imaging study which showed decreased counts at the  base with well-preserved left ventricular function.  There is some decreased  motion of the septum.  He is referred for further cardiac assessment.   DESCRIPTION OF PROCEDURE:  Informed consent was obtained.  The patient was  brought to the catheterization lab.  A 4 French sheath was placed in the  right femoral artery using the modified Seldinger technique.  A 4 [place] JL4  catheter was used to engage the left coronary artery and selective  angiography was performed in various projections using manual injections of  contrast.  A 4 French pigtail catheter was then advanced to the left  ventricle, and a left ventriculogram performed using power injections of  contrast.  At the termination of the case, the catheters and sheaths were  removed.  Manual pressure was applied until adequate hemostasis was  achieved.  The patient tolerated the procedure well and was transferred to  the floor in stable condition.   FINDINGS:  1. Left main trunk:  Large caliber vessel with mild irregularities.  2. Left anterior descending is a large caliber vessel that  provides two     major diagonal branches in the proximal segment.  The LAD has mild     irregularities of 20-30% in the distal section.  The diagonal branch has     mild irregularities of 20% as well.  First diagonal branch in the proximal segment followed immediately by a  bifurcating second diagonal branch.  There is then a third diagonal branch  in the mid section.  The LAD then extends to the apex.  The LAD has mild  diffuse disease of 30-40% in the proximal and mid section.  The diagonal  branch shows mild disease at 30%.  1. Left circumflex artery is a large caliber vessel that provides two     marginal branches in the midsection.  The AV circumflex has mild diffuse     disease of 20-30% and the marginal branches have mild irregularities.  2. The right coronary artery is dominant.  This is a large caliber vessel     that provides the posterior descending artery and posterior ventricular     branch in its terminal segment.  The right coronary has mild     irregularities of 20-30% in the proximal segment with a further narrowing     of 30% in the mid section.  The distal vessel in the posterior descending     artery has mild irregularities.   LEFT VENTRICULOGRAM:  Normal end-systolic and end-diastolic dimensions.  Overall left ventricular function is well-preserved with an ejection  fraction of greater than 55%.  No mitral regurgitation.  The LV pressure is  105/3.  Aortic is 106/67.  LVEDP equals 10.   ASSESSMENT AND PLAN:  [name] is a 60 year old gentleman with  noncritical coronary artery disease and well-preserved LV function.  Continued medical therapy and aggressive risk factor modification will be  pursued with aspirin and high dose statin.                                               Veneda Melter, M.D.    NG/MEDQ  D:  01/28/2004  T:  01/28/2004  Job:  [id number]   cc:   Julieanne Manson  [address]  [place]  [place] [address]  Fax: [phone]   Willa Rough, M.D.

## 2011-05-14 NOTE — Op Note (Signed)
[place]. [facility]  Patient:    Jerry Parsons, Jerry Parsons                       MRN: [id number] Proc. Date: 08/17/00 Adm. 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  PROCEDURE:  Anterior cervical diskectomy and fusion at C6-7.  SURGEON:  Payton Doughty, M.D.  ANESTHESIA:  General endotracheal.  PREP:  Betadine prep and alcohol wipe.  COMPLICATION:  None.  BODY OF TEXT:  A 60 year old gentleman with severe cervical spondylosis at C6-7.  DESCRIPTION OF PROCEDURE:  Taken to the operating room.  Intubated.  Placed supine on the operating room table in the Halter head traction and the neck extended.  Following shave, prep, and drape in the usual sterile fashion, the old skin incision was extended inferiorly approximately 2 cm parelleling the sternocleidomastoid muscle.  The platysma was identified, elevated, divided, and undermined.  The sternocleidomastoid was identified.  Medial dissection revealed the carotid arteries to tract lateral to the left, trachea and esophagus retracted laterally to the right, exposing the bones of the anterior cervical spine.  Some scarring from his old operation was present, but 6-7 was really identified by its prominent osteophyte.  Intraoperative x-ray confirmed correctness of level.  The osteophyte was removed.  The disk space opened.  Diskectomy carried out first under gross observation, and then using the operating microscope with a disk space spreader in place.  Having completed diskectomy and removal of posterior longitudinal ligament, removing the osteophyte, and decompression of the nerve roots, a 7 mm bone graft was tapped into place.  A 20 mm teathered plate was then placed with 13 mm screws. Intraoperative x-ray was a bit hazy, but showed good  placement of bone graft, plate, and screws.  The wound was irrigated.  Hemostasis assured.  The platysma was reapproximated with 3-0 Vicryl in interrupted fashion, subcutaneous tissues were reapproximated with 3-0 Vicryl, subcuticular tissues reapproximated with 5-0 Vicryl in running subcuticular fashion.  Benzoin and Steri-Strips were placed, made occlusive with Telfa and Op-Site.  The patient was then placed in an Aspen collar and returned to the recovery room in good condition. DD:  08/17/00 TD:  08/18/00 Job: [id number]

## 2011-05-14 NOTE — H&P (Signed)
[place]. [facility]  Patient:    Jerry Parsons, Jerry Parsons                       MRN: [id number] Adm. Date:  [id number] Attending:  Emeterio Reeve                         History and Physical  ADMISSION DIAGNOSIS:  Spondylosis C6-C7.  HISTORY:  This is a now 60 year old, right-handed white gentleman who has undergone a fusion at C3-4, C4-5, and C5-6 numerous years ago.  He has had increasing neck pain over the past few months.  The MRI shows a spondylitic change with cord compression at C6-C7, and he is now admitted for an anterior cervical diskectomy and fusion.  PAST MEDICAL HISTORY:  Remarkable for L4-5 and L5-S1 fusion in 1997, in his low back.  He has hypertension, for which he is on Accupril.  He uses Vioxx 25 mg q.d. and takes Protonix 40 mg q.d., Allegra 180 mg q.d., doxazosin 4 mg q.d., Reglan 5 mg t.i.d., OxyContin 20 mg q.d., Flonase two sprays q.d.  ALLERGIES:  No known drug allergies.  FAMILY HISTORY:  His mom died at age 53, of cancer.  Dad died at age 46, of unknown causes.  SOCIAL HISTORY:  He does not smoke.  He quit numerous years ago.  He is on disability.  He drinks alcohol only socially.  PHYSICAL EXAMINATION:  HEENT:  Within normal limits.  NECK:  Very limited range of motion with no lymphadenopathy.  CHEST:  Clear.  CARDIAC:  A regular rate and rhythm.  ABDOMEN:  Nontender, no hepatosplenomegaly.  EXTREMITIES:  Without cyanosis.  Peripheral pulses are good.  GENITOURINARY:  Deferred.  NEUROLOGIC:  He is awake, alert, oriented.  His cranial nerves are intact. Motor examination demonstrates 5/5 strength throughout the upper and lower extremities.  Head turning causes him to have some tingling in his arms and his chest.  He has excruciating interscapsular pain.  Lower extremity strength is full.  Reflexes are nonmyelopathic and Hoffmans is negative.  CLINICAL IMPRESSION:  Cervical spondylosis and progressive compression of  the canal.  PLAN:  An anterior cervical diskectomy and fusion at C6-C7 with a tether plate.  The risks and benefits of this approach have been discussed with him, and he wishes to proceed. DD:  08/17/00 TD:  08/17/00 Job: [id number]

## 2011-10-07 LAB — BASIC METABOLIC PANEL
BUN: 12
BUN: 13
CO2: 31
CO2: 31
Calcium: 8.3 — ABNORMAL LOW
Calcium: 8.8
Chloride: 97
Chloride: 98
Creatinine, Ser: 0.91
Creatinine, Ser: 1.11
GFR calc Af Amer: >60
GFR calc Af Amer: >60
GFR calc non Af Amer: >60
GFR calc non Af Amer: >60
Glucose, Bld: 189 — ABNORMAL HIGH
Glucose, Bld: 197 — ABNORMAL HIGH
Potassium: 4.3
Potassium: 4.6
Sodium: 134 — ABNORMAL LOW
Sodium: 134 — ABNORMAL LOW

## 2011-10-07 LAB — URINALYSIS, ROUTINE W REFLEX MICROSCOPIC
Bilirubin Urine: NEGATIVE
Glucose, UA: NEGATIVE
Hgb urine dipstick: NEGATIVE
Ketones, ur: NEGATIVE
Nitrite: NEGATIVE
Protein, ur: NEGATIVE
Specific Gravity, Urine: 1.018
Urobilinogen, UA: 0.2
pH: 6

## 2011-10-07 LAB — CBC
HCT: 38.1 — ABNORMAL LOW
HCT: 40.7
HCT: 46.4
Hemoglobin: 13.1
Hemoglobin: 13.9
Hemoglobin: 15.7
MCHC: 33.8
MCHC: 34.3
MCHC: 34.3
MCV: 86.4
MCV: 86.5
MCV: 86.5
Platelets: 148 — ABNORMAL LOW
Platelets: 207
Platelets: 238
RBC: 4.4
RBC: 4.7
RBC: 5.37
RDW: 13
RDW: 13
RDW: 13.1
WBC: 12.3 — ABNORMAL HIGH
WBC: 18.5 — ABNORMAL HIGH
WBC: 7.5

## 2011-10-07 LAB — TYPE AND SCREEN
ABO/RH(D): O POS
Antibody Screen: NEGATIVE

## 2011-10-07 LAB — COMPREHENSIVE METABOLIC PANEL
ALT: 30
AST: 20
Albumin: 4.1
Alkaline Phosphatase: 66
BUN: 11
CO2: 28
Calcium: 9.1
Chloride: 103
Creatinine, Ser: 1.3
GFR calc Af Amer: >60
GFR calc non Af Amer: 57 — ABNORMAL LOW
Glucose, Bld: 83
Potassium: 3.9
Sodium: 135
Total Bilirubin: 0.6
Total Protein: 6.6

## 2011-10-07 LAB — ABO/RH: ABO/RH(D): O POS

## 2011-10-07 LAB — DIFFERENTIAL
Basophils Absolute: 0
Basophils Relative: 0
Eosinophils Absolute: 0.2
Eosinophils Relative: 2
Lymphocytes Relative: 28
Lymphs Abs: 2.1
Monocytes Absolute: 0.5
Monocytes Relative: 7
Neutro Abs: 4.8
Neutrophils Relative %: 63

## 2011-10-07 LAB — PROTIME-INR
INR: 1
Prothrombin Time: 13.3

## 2011-10-07 LAB — APTT: aPTT: 33

## 2011-11-08 ENCOUNTER — Encounter: Payer: Self-pay | Admitting: Cardiovascular Disease

## 2011-11-11 ENCOUNTER — Ambulatory Visit (INDEPENDENT_AMBULATORY_CARE_PROVIDER_SITE_OTHER): Payer: Medicare Other | Admitting: Cardiovascular Disease

## 2011-11-11 ENCOUNTER — Encounter: Payer: Self-pay | Admitting: Cardiovascular Disease

## 2011-11-11 DIAGNOSIS — I10 Essential (primary) hypertension: Secondary | ICD-10-CM

## 2011-11-11 DIAGNOSIS — R0602 Shortness of breath: Secondary | ICD-10-CM

## 2011-11-11 DIAGNOSIS — E119 Type 2 diabetes mellitus without complications: Secondary | ICD-10-CM

## 2011-11-11 DIAGNOSIS — I5189 Other ill-defined heart diseases: Secondary | ICD-10-CM

## 2011-11-11 DIAGNOSIS — I519 Heart disease, unspecified: Secondary | ICD-10-CM

## 2011-11-11 DIAGNOSIS — E785 Hyperlipidemia, unspecified: Secondary | ICD-10-CM | POA: Insufficient documentation

## 2011-11-11 NOTE — Assessment & Plan Note (Signed)
We have encouraged continued exercise, careful diet management in an effort to lose weight. 

## 2011-11-11 NOTE — Assessment & Plan Note (Signed)
Blood pressure is well controlled on today's visit. No changes made to the medications. 

## 2011-11-11 NOTE — Assessment & Plan Note (Signed)
Cholesterol is at goal on the current lipid regimen. No changes to the medications were made. Triglycerides are elevated and we have suggested he start fish oil

## 2011-11-11 NOTE — Progress Notes (Signed)
Patient ID: Jerry Parsons, male    DOB: 1951/09/06, 60 y.o.   MRN: [id number]  HPI Comments: [name] is a very pleasant 60 year old gentleman, patient of Dr. Sullivan Lone, with a history of multiple back surgeries, DVT, PE in March 2011, hypertension, depression, diabetes and history of renal disease, s/p arthroscopic knee surgery,  found to have tachycardia, started on metoprolol tartrate, presenting for routine followup.  He reports having significant knee pain. This has been chronic. He has been seen by the renal service, Dr. Eliott Nine, who started him on Lasix 40 mg b.i.d. For lower extremity edema. He denies any significant chest pain. He does have some mild shortness of breath.   he reports having a significant cardiac workup in the 1990s. He had a cardiac catheterization. His wife reports he had minimal blockage. He does report chronic sweating at nighttime which he attributes to the Vicodin. Previous DVT , he did have shortness of breath.  Old echocardiogram March 2011 shows normal LV and RV systolic function, mild LVH, diastolic dysfunction, mildly dilated left atrium.  EKG shows normal sinus rhythm with rate 90  beats per minute, Rare PVC, T Wave abnormality in one, aVL    Outpatient Encounter Prescriptions as of 11/11/2011  Medication Sig Dispense Refill  . aspirin 81 MG tablet Take 81 mg by mouth daily.        . Cholecalciferol (VITAMIN D3) 50000 UNITS CAPS Take 50,000 Units by mouth 2 (two) times a week.        . cyclobenzaprine (FLEXERIL) 10 MG tablet Take 10 mg by mouth 3 (three) times daily as needed.        . diazepam (VALIUM) 2 MG tablet Take 2 mg by mouth every 6 (six) hours as needed.        . dutasteride (AVODART) 0.5 MG capsule Take 0.5 mg by mouth daily.        [name] esomeprazole (NEXIUM) 40 MG capsule Take 40 mg by mouth daily before breakfast.        . fexofenadine (ALLEGRA) 180 MG tablet Take 180 mg by mouth daily.        . furosemide (LASIX) 20 MG tablet Take two (20  mg) tablets twice a day.       . gabapentin (NEURONTIN) 300 MG capsule 300 mg. Take six tablets once daily       . glimepiride (AMARYL) 2 MG tablet Take 2 mg by mouth daily before breakfast.        . LIRAGLUTIDE [place] Inject into the skin.        . metoprolol (LOPRESSOR) 25 MG tablet Take 1 tablet (25 mg total) by mouth 2 (two) times daily.  60 tablet  6  . oxyCODONE (OXYCONTIN) 40 MG 12 hr tablet Take 40 mg by mouth 3 (three) times daily.        . pioglitazone-metformin (ACTOPLUS MET) 15-850 MG per tablet Take 1 tablet by mouth 2 (two) times daily with a meal.        . ranitidine (ZANTAC) 300 MG tablet Take 300 mg by mouth at bedtime.        . sitaGLIPtan (JANUVIA) 100 MG tablet Take 100 mg by mouth daily.        [name] DISCONTD: hydrochlorothiazide 25 MG tablet Take 1 tablet (25 mg total) by mouth daily.  30 tablet  6     Review of Systems  Constitutional: Negative.        Insomnia  HENT: Negative.  Eyes: Negative.   Respiratory: Positive for shortness of breath.   Cardiovascular: Positive for leg swelling.  Gastrointestinal: Negative.   Musculoskeletal: Negative.   Skin: Negative.   Neurological: Negative.   Hematological: Negative.   Psychiatric/Behavioral: Negative.   All other systems reviewed and are negative.    BP 134/82  Pulse 90  Ht 5\' 11"  (1.803 m)  Wt 277 lb 4 oz (125.76 kg)  BMI 38.67 kg/m2   Physical Exam  Nursing note and vitals reviewed. Constitutional: He is oriented to person, place, and time. He appears well-developed and well-nourished.  HENT:  Head: Normocephalic.  Nose: Nose normal.  Mouth/Throat: Oropharynx is clear and moist.  Eyes: Conjunctivae are normal. Pupils are equal, round, and reactive to light.  Neck: Normal range of motion. Neck supple. No JVD present.  Cardiovascular: Normal rate, regular rhythm, S1 normal, S2 normal, normal heart sounds and intact distal pulses.  Exam reveals no gallop and no friction rub.   No murmur  heard. Pulmonary/Chest: Effort normal and breath sounds normal. No respiratory distress. He has no wheezes. He has no rales. He exhibits no tenderness.  Abdominal: Soft. Bowel sounds are normal. He exhibits no distension. There is no tenderness.  Musculoskeletal: Normal range of motion. He exhibits no edema and no tenderness.  Lymphadenopathy:    He has no cervical adenopathy.  Neurological: He is alert and oriented to person, place, and time. Coordination normal.  Skin: Skin is warm and dry. No rash noted. No erythema.  Psychiatric: He has a normal mood and affect. His behavior is normal. Judgment and thought content normal.           Assessment and Plan

## 2011-11-11 NOTE — Assessment & Plan Note (Signed)
He will likely require Lasix p.r.n. I have recommended he hold the Lasix when his edema has improved and uses p.r.n.. Concern about overdiuresis. Currently he takes a large dose, 40 mg b.i.d.. I suggested he may close followup with Dr. Eliott Nine and if his creatinine rises, that he certainly cut back on his diuretic.

## 2011-11-11 NOTE — Patient Instructions (Signed)
You are doing well. No medication changes were made. Please start 2 to 4 fish oil a day Monitor your weight.  Cut back on lasix if weight is dropping and no edema.  Please call us if you have new issues that need to be addressed before your next appt.  The office will contact you for a follow up Appt. In 12 months

## 2011-11-11 NOTE — Assessment & Plan Note (Signed)
Mild shortness of breath, stable. Likely secondary to deconditioning, obesity. I suggested he use Lasix when he has increasing shortness of breath or edema for diastolic dysfunction.

## 2011-11-17 ENCOUNTER — Other Ambulatory Visit: Payer: Self-pay | Admitting: Cardiovascular Disease

## 2011-11-17 NOTE — Telephone Encounter (Signed)
Duplicate message. 

## 2011-12-29 DIAGNOSIS — E291 Testicular hypofunction: Secondary | ICD-10-CM | POA: Diagnosis not present

## 2011-12-30 DIAGNOSIS — I10 Essential (primary) hypertension: Secondary | ICD-10-CM | POA: Diagnosis not present

## 2011-12-30 DIAGNOSIS — Z23 Encounter for immunization: Secondary | ICD-10-CM | POA: Diagnosis not present

## 2011-12-30 DIAGNOSIS — E119 Type 2 diabetes mellitus without complications: Secondary | ICD-10-CM | POA: Diagnosis not present

## 2011-12-30 DIAGNOSIS — Z79899 Other long term (current) drug therapy: Secondary | ICD-10-CM | POA: Diagnosis not present

## 2011-12-30 DIAGNOSIS — E7800 Pure hypercholesterolemia, unspecified: Secondary | ICD-10-CM | POA: Diagnosis not present

## 2011-12-30 DIAGNOSIS — N289 Disorder of kidney and ureter, unspecified: Secondary | ICD-10-CM | POA: Diagnosis not present

## 2012-01-19 DIAGNOSIS — E291 Testicular hypofunction: Secondary | ICD-10-CM | POA: Diagnosis not present

## 2012-02-08 ENCOUNTER — Ambulatory Visit: Payer: Self-pay | Admitting: Family Medicine

## 2012-02-08 DIAGNOSIS — R05 Cough: Secondary | ICD-10-CM | POA: Diagnosis not present

## 2012-02-08 DIAGNOSIS — R079 Chest pain, unspecified: Secondary | ICD-10-CM | POA: Diagnosis not present

## 2012-02-08 DIAGNOSIS — J189 Pneumonia, unspecified organism: Secondary | ICD-10-CM | POA: Diagnosis not present

## 2012-02-08 DIAGNOSIS — R059 Cough, unspecified: Secondary | ICD-10-CM | POA: Diagnosis not present

## 2012-02-08 DIAGNOSIS — E119 Type 2 diabetes mellitus without complications: Secondary | ICD-10-CM | POA: Diagnosis not present

## 2012-02-08 DIAGNOSIS — R918 Other nonspecific abnormal finding of lung field: Secondary | ICD-10-CM | POA: Diagnosis not present

## 2012-02-08 DIAGNOSIS — I10 Essential (primary) hypertension: Secondary | ICD-10-CM | POA: Diagnosis not present

## 2012-02-16 DIAGNOSIS — E291 Testicular hypofunction: Secondary | ICD-10-CM | POA: Diagnosis not present

## 2012-03-07 DIAGNOSIS — E291 Testicular hypofunction: Secondary | ICD-10-CM | POA: Diagnosis not present

## 2012-03-15 DIAGNOSIS — E119 Type 2 diabetes mellitus without complications: Secondary | ICD-10-CM | POA: Diagnosis not present

## 2012-03-15 DIAGNOSIS — E669 Obesity, unspecified: Secondary | ICD-10-CM | POA: Diagnosis not present

## 2012-03-15 DIAGNOSIS — M79609 Pain in unspecified limb: Secondary | ICD-10-CM | POA: Diagnosis not present

## 2012-03-15 DIAGNOSIS — J449 Chronic obstructive pulmonary disease, unspecified: Secondary | ICD-10-CM | POA: Diagnosis not present

## 2012-03-16 DIAGNOSIS — M47817 Spondylosis without myelopathy or radiculopathy, lumbosacral region: Secondary | ICD-10-CM | POA: Diagnosis not present

## 2012-04-04 DIAGNOSIS — E291 Testicular hypofunction: Secondary | ICD-10-CM | POA: Diagnosis not present

## 2012-04-11 ENCOUNTER — Ambulatory Visit: Payer: Self-pay | Admitting: Family Medicine

## 2012-04-11 DIAGNOSIS — R0609 Other forms of dyspnea: Secondary | ICD-10-CM | POA: Diagnosis not present

## 2012-04-11 DIAGNOSIS — G4733 Obstructive sleep apnea (adult) (pediatric): Secondary | ICD-10-CM | POA: Diagnosis not present

## 2012-04-11 DIAGNOSIS — R0989 Other specified symptoms and signs involving the circulatory and respiratory systems: Secondary | ICD-10-CM | POA: Diagnosis not present

## 2012-04-18 ENCOUNTER — Other Ambulatory Visit: Payer: Self-pay | Admitting: Cardiovascular Disease

## 2012-04-19 ENCOUNTER — Other Ambulatory Visit: Payer: Self-pay | Admitting: *Deleted

## 2012-04-19 MED ORDER — METOPROLOL TARTRATE 25 MG PO TABS: 25.0000 mg | ORAL_TABLET | Freq: Every evening | ORAL | Status: DC

## 2012-04-19 MED ORDER — METOPROLOL TARTRATE 50 MG PO TABS: 50.0000 mg | ORAL_TABLET | Freq: Every morning | ORAL | Status: DC

## 2012-04-25 DIAGNOSIS — E291 Testicular hypofunction: Secondary | ICD-10-CM | POA: Diagnosis not present

## 2012-04-28 DIAGNOSIS — I129 Hypertensive chronic kidney disease with stage 1 through stage 4 chronic kidney disease, or unspecified chronic kidney disease: Secondary | ICD-10-CM | POA: Diagnosis not present

## 2012-04-28 DIAGNOSIS — N183 Chronic kidney disease, stage 3 unspecified: Secondary | ICD-10-CM | POA: Diagnosis not present

## 2012-04-28 DIAGNOSIS — I10 Essential (primary) hypertension: Secondary | ICD-10-CM | POA: Diagnosis not present

## 2012-04-28 DIAGNOSIS — E559 Vitamin D deficiency, unspecified: Secondary | ICD-10-CM | POA: Diagnosis not present

## 2012-05-10 DIAGNOSIS — M549 Dorsalgia, unspecified: Secondary | ICD-10-CM | POA: Diagnosis not present

## 2012-05-10 DIAGNOSIS — G4730 Sleep apnea, unspecified: Secondary | ICD-10-CM | POA: Diagnosis not present

## 2012-05-10 DIAGNOSIS — M79609 Pain in unspecified limb: Secondary | ICD-10-CM | POA: Diagnosis not present

## 2012-05-10 DIAGNOSIS — E119 Type 2 diabetes mellitus without complications: Secondary | ICD-10-CM | POA: Diagnosis not present

## 2012-05-15 DIAGNOSIS — H179 Unspecified corneal scar and opacity: Secondary | ICD-10-CM | POA: Diagnosis not present

## 2012-05-16 DIAGNOSIS — E291 Testicular hypofunction: Secondary | ICD-10-CM | POA: Diagnosis not present

## 2012-05-19 ENCOUNTER — Ambulatory Visit (INDEPENDENT_AMBULATORY_CARE_PROVIDER_SITE_OTHER): Payer: Medicare Other | Admitting: Cardiovascular Disease

## 2012-05-19 ENCOUNTER — Ambulatory Visit: Payer: Self-pay | Admitting: Family Medicine

## 2012-05-19 ENCOUNTER — Encounter: Payer: Self-pay | Admitting: Cardiovascular Disease

## 2012-05-19 VITALS — BP 140/90 | HR 93 | Ht 71.0 in | Wt 274.5 lb

## 2012-05-19 DIAGNOSIS — R0609 Other forms of dyspnea: Secondary | ICD-10-CM | POA: Diagnosis not present

## 2012-05-19 DIAGNOSIS — E669 Obesity, unspecified: Secondary | ICD-10-CM | POA: Insufficient documentation

## 2012-05-19 DIAGNOSIS — I5189 Other ill-defined heart diseases: Secondary | ICD-10-CM

## 2012-05-19 DIAGNOSIS — R0602 Shortness of breath: Secondary | ICD-10-CM

## 2012-05-19 DIAGNOSIS — E119 Type 2 diabetes mellitus without complications: Secondary | ICD-10-CM

## 2012-05-19 DIAGNOSIS — R0989 Other specified symptoms and signs involving the circulatory and respiratory systems: Secondary | ICD-10-CM | POA: Diagnosis not present

## 2012-05-19 DIAGNOSIS — I519 Heart disease, unspecified: Secondary | ICD-10-CM | POA: Diagnosis not present

## 2012-05-19 DIAGNOSIS — E785 Hyperlipidemia, unspecified: Secondary | ICD-10-CM

## 2012-05-19 DIAGNOSIS — I10 Essential (primary) hypertension: Secondary | ICD-10-CM | POA: Diagnosis not present

## 2012-05-19 DIAGNOSIS — G4733 Obstructive sleep apnea (adult) (pediatric): Secondary | ICD-10-CM | POA: Diagnosis not present

## 2012-05-19 NOTE — Assessment & Plan Note (Signed)
He has had significant weight loss. He would like to continue to lose weight before starting a cholesterol medication. He reports having routine blood work at the end of the summer through Dr. Sullivan Lone.

## 2012-05-19 NOTE — Progress Notes (Signed)
Patient ID: Jerry Parsons, male    DOB: 14-Aug-1951, 61 y.o.   MRN: [id number]  HPI Comments: Jerry Parsons is a very pleasant 61 year old gentleman, patient of Dr. Sullivan Lone, with a history of multiple back surgeries, DVT, PE in March 2011, 30 year smoking history,  hypertension, depression, diabetes and history of renal disease, s/p arthroscopic knee surgery,  found to have tachycardia, started on metoprolol tartrate, presenting for routine followup.  He reports having continued significant knee pain. This has been chronic. He has been seen by the renal service, Dr. Eliott Nine, who started him on Lasix 40 mg b.i.d. For lower extremity edema.  He denies any significant chest pain.  He does report a recent episode of shortness of breath with lower edema. He eats significant amount of ice daily. His wife reports that he "dreams the ice maker" and has to buy separate bags of ice. He took an extra Lasix with significant diuresis and improvement of his symptoms. Edema has since improved with no significant shortness of breath. He has lost weight recently, up to 13 pounds.   Recent lab work showed total cholesterol 171, LDL 56, triglycerides 392, HDL 37  cardiac workup in the 1990s:  cardiac catheterization with "minimal blockage". He does report chronic sweating at nighttime which he attributes to the Vicodin. Previous DVT , he did have shortness of breath.  echocardiogram March 2011 shows normal LV and RV systolic function, mild LVH, diastolic dysfunction, mildly dilated left atrium.  EKG shows normal sinus rhythm with rate 93  beats per minute, no significant ST or T wave changes    Outpatient Encounter Prescriptions as of 05/19/2012  Medication Sig Dispense Refill  . amitriptyline (ELAVIL) 25 MG tablet Take 25 mg by mouth at bedtime.      [name] aspirin 81 MG tablet Take 81 mg by mouth daily.        . Cholecalciferol (VITAMIN D3) 50000 UNITS CAPS Take 50,000 Units by mouth 2 (two) times a week.        .  cyclobenzaprine (FLEXERIL) 10 MG tablet Take 10 mg by mouth 2 (two) times daily as needed.       . diazepam (VALIUM) 2 MG tablet Take 2 mg by mouth every 6 (six) hours as needed.        . dutasteride (AVODART) 0.5 MG capsule Take 0.5 mg by mouth daily.        [name] esomeprazole (NEXIUM) 40 MG capsule Take 40 mg by mouth daily before breakfast.        . fexofenadine (ALLEGRA) 180 MG tablet Take 180 mg by mouth daily.        . furosemide (LASIX) 20 MG tablet Take two (20 mg) tablets twice a day.       . gabapentin (NEURONTIN) 300 MG capsule 300 mg. Take six tablets once daily      . LIRAGLUTIDE [place] Inject into the skin.        . metFORMIN (GLUCOPHAGE) 1000 MG tablet Take 1,000 mg by mouth daily with breakfast.      . metoprolol tartrate (LOPRESSOR) 25 MG tablet Take 1 tablet (25 mg total) by mouth every evening.  60 tablet  2  . NON FORMULARY Depro shot one every 3 weeks      . Omega-3 Fatty Acids (FISH OIL) 1200 MG CAPS Take 1,200 mg by mouth 2 (two) times daily.      [name] oxyCODONE (OXYCONTIN) 40 MG 12 hr tablet Take 40 mg by mouth  3 (three) times daily.        . ranitidine (ZANTAC) 300 MG tablet Take 300 mg by mouth at bedtime.        . sitaGLIPtan (JANUVIA) 100 MG tablet Take 100 mg by mouth daily.         Review of Systems  Constitutional: Negative.        Insomnia  HENT: Negative.   Eyes: Negative.   Respiratory: Positive for shortness of breath.   Cardiovascular: Positive for leg swelling.  Gastrointestinal: Negative.   Musculoskeletal: Negative.   Skin: Negative.   Neurological: Negative.   Hematological: Negative.   Psychiatric/Behavioral: Negative.   All other systems reviewed and are negative.   BP 140/90  Pulse 93  Ht 5\' 11"  (1.803 m)  Wt 274 lb 8 oz (124.512 kg)  BMI 38.28 kg/m2  Physical Exam  Nursing note and vitals reviewed. Constitutional: He is oriented to person, place, and time. He appears well-developed and well-nourished.  HENT:  Head: Normocephalic.  Nose: Nose  normal.  Mouth/Throat: Oropharynx is clear and moist.  Eyes: Conjunctivae are normal. Pupils are equal, round, and reactive to light.  Neck: Normal range of motion. Neck supple. No JVD present.  Cardiovascular: Normal rate, regular rhythm, S1 normal, S2 normal, normal heart sounds and intact distal pulses.  Exam reveals no gallop and no friction rub.   No murmur heard. Pulmonary/Chest: Effort normal and breath sounds normal. No respiratory distress. He has no wheezes. He has no rales. He exhibits no tenderness.  Abdominal: Soft. Bowel sounds are normal. He exhibits no distension. There is no tenderness.  Musculoskeletal: Normal range of motion. He exhibits no edema and no tenderness.  Lymphadenopathy:    He has no cervical adenopathy.  Neurological: He is alert and oriented to person, place, and time. Coordination normal.  Skin: Skin is warm and dry. No rash noted. No erythema.  Psychiatric: He has a normal mood and affect. His behavior is normal. Judgment and thought content normal.           Assessment and Plan

## 2012-05-19 NOTE — Assessment & Plan Note (Signed)
We have encouraged careful diet management in an effort to lose weight. He is unable to exercise well secondary to chronic knee pain.

## 2012-05-19 NOTE — Assessment & Plan Note (Signed)
Weight is slowly trending downward. This will help with blood pressure, diabetes and high cholesterol. We have encouraged him to continue this trend.

## 2012-05-19 NOTE — Patient Instructions (Addendum)
You are doing well. No medication changes were made.  Ask Dr. Sullivan Lone about Voltaren cream  Please call us if you have new issues that need to be addressed before your next appt.  Your physician wants you to follow-up in: 6 months.  You will receive a reminder letter in the mail two months in advance. If you don't receive a letter, please call our office to schedule the follow-up appointment.

## 2012-05-19 NOTE — Assessment & Plan Note (Signed)
Significant by mouth fluid intake, with the need for extra Lasix when necessary for symptoms of shortness of breath and edema. We have encouraged him to decrease his ice intake and use extra doses of Lasix as needed.

## 2012-05-19 NOTE — Assessment & Plan Note (Addendum)
Blood pressure is mildly elevated. We have encouraged him to monitor his blood pressure at home. He does report that her blood pressures outside the office.  No changes made to the medications.

## 2012-06-06 DIAGNOSIS — E291 Testicular hypofunction: Secondary | ICD-10-CM | POA: Diagnosis not present

## 2012-06-10 DIAGNOSIS — K5289 Other specified noninfective gastroenteritis and colitis: Secondary | ICD-10-CM | POA: Diagnosis not present

## 2012-06-22 DIAGNOSIS — M47817 Spondylosis without myelopathy or radiculopathy, lumbosacral region: Secondary | ICD-10-CM | POA: Diagnosis not present

## 2012-07-11 DIAGNOSIS — G4730 Sleep apnea, unspecified: Secondary | ICD-10-CM | POA: Diagnosis not present

## 2012-07-11 DIAGNOSIS — M79609 Pain in unspecified limb: Secondary | ICD-10-CM | POA: Diagnosis not present

## 2012-07-11 DIAGNOSIS — E119 Type 2 diabetes mellitus without complications: Secondary | ICD-10-CM | POA: Diagnosis not present

## 2012-07-11 DIAGNOSIS — L0202 Furuncle of face: Secondary | ICD-10-CM | POA: Diagnosis not present

## 2012-07-11 DIAGNOSIS — E291 Testicular hypofunction: Secondary | ICD-10-CM | POA: Diagnosis not present

## 2012-07-17 DIAGNOSIS — M79609 Pain in unspecified limb: Secondary | ICD-10-CM | POA: Diagnosis not present

## 2012-07-17 DIAGNOSIS — I10 Essential (primary) hypertension: Secondary | ICD-10-CM | POA: Diagnosis not present

## 2012-07-17 DIAGNOSIS — E119 Type 2 diabetes mellitus without complications: Secondary | ICD-10-CM | POA: Diagnosis not present

## 2012-07-17 DIAGNOSIS — E7800 Pure hypercholesterolemia, unspecified: Secondary | ICD-10-CM | POA: Diagnosis not present

## 2012-07-24 DIAGNOSIS — M79609 Pain in unspecified limb: Secondary | ICD-10-CM | POA: Diagnosis not present

## 2012-07-24 DIAGNOSIS — J449 Chronic obstructive pulmonary disease, unspecified: Secondary | ICD-10-CM | POA: Diagnosis not present

## 2012-07-24 DIAGNOSIS — E119 Type 2 diabetes mellitus without complications: Secondary | ICD-10-CM | POA: Diagnosis not present

## 2012-07-24 DIAGNOSIS — E669 Obesity, unspecified: Secondary | ICD-10-CM | POA: Diagnosis not present

## 2012-07-28 DIAGNOSIS — E291 Testicular hypofunction: Secondary | ICD-10-CM | POA: Diagnosis not present

## 2012-08-21 ENCOUNTER — Other Ambulatory Visit: Payer: Self-pay | Admitting: Cardiovascular Disease

## 2012-08-21 NOTE — Telephone Encounter (Signed)
Refilled Metoprolol

## 2012-08-29 DIAGNOSIS — E291 Testicular hypofunction: Secondary | ICD-10-CM | POA: Diagnosis not present

## 2012-09-19 DIAGNOSIS — E291 Testicular hypofunction: Secondary | ICD-10-CM | POA: Diagnosis not present

## 2012-09-27 DIAGNOSIS — D235 Other benign neoplasm of skin of trunk: Secondary | ICD-10-CM | POA: Diagnosis not present

## 2012-09-27 DIAGNOSIS — L821 Other seborrheic keratosis: Secondary | ICD-10-CM | POA: Diagnosis not present

## 2012-09-27 DIAGNOSIS — L819 Disorder of pigmentation, unspecified: Secondary | ICD-10-CM | POA: Diagnosis not present

## 2012-09-27 DIAGNOSIS — Z0189 Encounter for other specified special examinations: Secondary | ICD-10-CM | POA: Diagnosis not present

## 2012-09-27 DIAGNOSIS — D485 Neoplasm of uncertain behavior of skin: Secondary | ICD-10-CM | POA: Diagnosis not present

## 2012-10-02 DIAGNOSIS — M47817 Spondylosis without myelopathy or radiculopathy, lumbosacral region: Secondary | ICD-10-CM | POA: Diagnosis not present

## 2012-10-12 DIAGNOSIS — E7800 Pure hypercholesterolemia, unspecified: Secondary | ICD-10-CM | POA: Diagnosis not present

## 2012-10-12 DIAGNOSIS — E119 Type 2 diabetes mellitus without complications: Secondary | ICD-10-CM | POA: Diagnosis not present

## 2012-10-12 DIAGNOSIS — M549 Dorsalgia, unspecified: Secondary | ICD-10-CM | POA: Diagnosis not present

## 2012-10-12 DIAGNOSIS — I2510 Atherosclerotic heart disease of native coronary artery without angina pectoris: Secondary | ICD-10-CM | POA: Diagnosis not present

## 2012-10-13 DIAGNOSIS — E291 Testicular hypofunction: Secondary | ICD-10-CM | POA: Diagnosis not present

## 2012-10-17 DIAGNOSIS — E559 Vitamin D deficiency, unspecified: Secondary | ICD-10-CM | POA: Diagnosis not present

## 2012-10-30 DIAGNOSIS — F5089 Other specified eating disorder: Secondary | ICD-10-CM | POA: Diagnosis not present

## 2012-10-30 DIAGNOSIS — M1990 Unspecified osteoarthritis, unspecified site: Secondary | ICD-10-CM | POA: Diagnosis not present

## 2012-10-30 DIAGNOSIS — E119 Type 2 diabetes mellitus without complications: Secondary | ICD-10-CM | POA: Diagnosis not present

## 2012-10-30 DIAGNOSIS — Z23 Encounter for immunization: Secondary | ICD-10-CM | POA: Diagnosis not present

## 2012-10-30 DIAGNOSIS — K2100 Gastro-esophageal reflux disease with esophagitis, without bleeding: Secondary | ICD-10-CM | POA: Diagnosis not present

## 2012-10-30 DIAGNOSIS — M549 Dorsalgia, unspecified: Secondary | ICD-10-CM | POA: Diagnosis not present

## 2012-10-31 DIAGNOSIS — E291 Testicular hypofunction: Secondary | ICD-10-CM | POA: Diagnosis not present

## 2012-11-10 ENCOUNTER — Other Ambulatory Visit: Payer: Self-pay | Admitting: Cardiovascular Disease

## 2012-11-16 ENCOUNTER — Ambulatory Visit (INDEPENDENT_AMBULATORY_CARE_PROVIDER_SITE_OTHER): Payer: Medicare Other | Admitting: Cardiovascular Disease

## 2012-11-16 ENCOUNTER — Encounter: Payer: Self-pay | Admitting: Cardiovascular Disease

## 2012-11-16 VITALS — BP 140/82 | HR 79 | Ht 71.0 in | Wt 269.0 lb

## 2012-11-16 DIAGNOSIS — I10 Essential (primary) hypertension: Secondary | ICD-10-CM

## 2012-11-16 DIAGNOSIS — E669 Obesity, unspecified: Secondary | ICD-10-CM | POA: Diagnosis not present

## 2012-11-16 DIAGNOSIS — E785 Hyperlipidemia, unspecified: Secondary | ICD-10-CM

## 2012-11-16 DIAGNOSIS — R0602 Shortness of breath: Secondary | ICD-10-CM | POA: Diagnosis not present

## 2012-11-16 NOTE — Assessment & Plan Note (Signed)
We have encouraged continued exercise, careful diet management in an effort to lose weight. 

## 2012-11-16 NOTE — Assessment & Plan Note (Signed)
Total cholesterol 170 earlier in January 2013. Triglycerides close to 400. We did discuss new guidelines suggesting all diabetics should be on a statin. He and his wife would like to check there new blood work with Dr. Sullivan Lone in early 2014 before making any decisions. He reports having lost weight and eating better.

## 2012-11-16 NOTE — Assessment & Plan Note (Signed)
Blood pressure is well controlled on today's visit. No changes made to the medications. 

## 2012-11-16 NOTE — Patient Instructions (Addendum)
You are doing well. No medication changes were made.  Please call us if you have new issues that need to be addressed before your next appt.  Your physician wants you to follow-up in: 6 months.  You will receive a reminder letter in the mail two months in advance. If you don't receive a letter, please call our office to schedule the follow-up appointment.   

## 2012-11-16 NOTE — Assessment & Plan Note (Signed)
Significant by mouth fluid intake, on Lasix. Minimal edema of the lower extremities. Lungs clear. No clinical signs of diastolic CHF. Mild shortness of breath, likely secondary to weight and deconditioning.

## 2012-11-16 NOTE — Progress Notes (Signed)
Patient ID: Jerry Parsons, male    DOB: 11-03-1951, 61 y.o.   MRN: [id number]  HPI Comments: Jerry Parsons is a very pleasant 61 year old gentleman, patient of Dr. Sullivan Lone, with a history of multiple back surgeries, spinal stenosis , diabetes , DVT, PE in March 2011, 30 year smoking history,  hypertension, depression, and history of renal disease, s/p arthroscopic knee surgery,  found to have tachycardia, started on metoprolol tartrate, presenting for routine followup.  Still with severe chronic knee pain.  on Lasix 40 mg  For lower extremity edema. Significant ice intake. He denies any significant chest pain.  He has been watching his diet, reports the left thumb weight loss  Previous total cholesterol 171, LDL 56, triglycerides 392, HDL 37  cardiac workup in the 1990s:  cardiac catheterization with "minimal blockage". He does report chronic sweating at nighttime which he attributes to the Vicodin. Previous DVT , he did have shortness of breath.  echocardiogram March 2011 shows normal LV and RV systolic function, mild LVH, diastolic dysfunction, mildly dilated left atrium.  EKG shows normal sinus rhythm with rate 79  beats per minute, no significant ST or T wave changes    Outpatient Encounter Prescriptions as of 11/16/2012  Medication Sig Dispense Refill  . amitriptyline (ELAVIL) 25 MG tablet Take 25 mg by mouth at bedtime.      [name] aspirin 81 MG tablet Take 81 mg by mouth daily.        . cyclobenzaprine (FLEXERIL) 10 MG tablet Take 10 mg by mouth 2 (two) times daily as needed.       . diazepam (VALIUM) 2 MG tablet Take 2 mg by mouth every 6 (six) hours as needed.        . dutasteride (AVODART) 0.5 MG capsule Take 0.5 mg by mouth daily.        [name] esomeprazole (NEXIUM) 40 MG capsule Take 40 mg by mouth daily before breakfast.        . ferrous sulfate (FEOSOL) 325 (65 FE) MG tablet Take 325 mg by mouth 2 (two) times daily.      . fexofenadine (ALLEGRA) 180 MG tablet Take 180 mg by mouth  daily.        . furosemide (LASIX) 20 MG tablet Take two (20 mg) tablets twice a day.       . gabapentin (NEURONTIN) 800 MG tablet Take 800 mg by mouth 3 (three) times daily.       . hydrochlorothiazide (HYDRODIURIL) 25 MG tablet TAKE ONE TABLET BY MOUTH EVERY DAY  30 tablet  5  . Insulin Glargine (LANTUS SOLOSTAR [place]) Inject into the skin daily.      [name] LIRAGLUTIDE [place] Inject into the skin.        . metFORMIN (GLUCOPHAGE) 1000 MG tablet Take 1,000 mg by mouth daily with breakfast.      . metoprolol (LOPRESSOR) 50 MG tablet Takes 50 mg am and 25 pm daily.      . Multiple Vitamins-Minerals (CENTRUM SILVER ULTRA MENS PO) Take by mouth daily.      . NON FORMULARY Depro shot one every 3 weeks      . Omega-3 Fatty Acids (FISH OIL) 1200 MG CAPS Take 1,200 mg by mouth 2 (two) times daily.      [name] oxyCODONE (OXYCONTIN) 40 MG 12 hr tablet Take 40 mg by mouth 3 (three) times daily.        . ranitidine (ZANTAC) 300 MG tablet Take 300 mg by mouth  at bedtime.        . sitaGLIPtan (JANUVIA) 100 MG tablet Take 100 mg by mouth daily.          Review of Systems  Constitutional: Negative.        Obese   HENT: Negative.   Eyes: Negative.   Respiratory: Positive for shortness of breath.   Cardiovascular: Positive for leg swelling.  Gastrointestinal: Negative.   Musculoskeletal: Positive for arthralgias.  Skin: Negative.   Neurological: Negative.   Hematological: Negative.   Psychiatric/Behavioral: Negative.   All other systems reviewed and are negative.   BP 140/82  Pulse 79  Ht 5\' 11"  (1.803 m)  Wt 269 lb (122.018 kg)  BMI 37.52 kg/m2  Physical Exam  Nursing note and vitals reviewed. Constitutional: He is oriented to person, place, and time. He appears well-developed and well-nourished.  HENT:  Head: Normocephalic.  Nose: Nose normal.  Mouth/Throat: Oropharynx is clear and moist.  Eyes: Conjunctivae normal are normal. Pupils are equal, round, and reactive to light.  Neck: Normal range of  motion. Neck supple. No JVD present.  Cardiovascular: Normal rate, regular rhythm, S1 normal, S2 normal, normal heart sounds and intact distal pulses.  Exam reveals no gallop and no friction rub.   No murmur heard. Pulmonary/Chest: Effort normal and breath sounds normal. No respiratory distress. He has no wheezes. He has no rales. He exhibits no tenderness.  Abdominal: Soft. Bowel sounds are normal. He exhibits no distension. There is no tenderness.  Musculoskeletal: Normal range of motion. He exhibits no edema and no tenderness.  Lymphadenopathy:    He has no cervical adenopathy.  Neurological: He is alert and oriented to person, place, and time. Coordination normal.  Skin: Skin is warm and dry. No rash noted. No erythema.  Psychiatric: He has a normal mood and affect. His behavior is normal. Judgment and thought content normal.           Assessment and Plan

## 2012-11-21 DIAGNOSIS — E291 Testicular hypofunction: Secondary | ICD-10-CM | POA: Diagnosis not present

## 2012-12-04 ENCOUNTER — Other Ambulatory Visit: Payer: Self-pay | Admitting: Cardiovascular Disease

## 2012-12-13 DIAGNOSIS — E291 Testicular hypofunction: Secondary | ICD-10-CM | POA: Diagnosis not present

## 2013-01-02 DIAGNOSIS — E291 Testicular hypofunction: Secondary | ICD-10-CM | POA: Diagnosis not present

## 2013-01-10 DIAGNOSIS — M47817 Spondylosis without myelopathy or radiculopathy, lumbosacral region: Secondary | ICD-10-CM | POA: Diagnosis not present

## 2013-01-22 DIAGNOSIS — R9431 Abnormal electrocardiogram [ECG] [EKG]: Secondary | ICD-10-CM | POA: Diagnosis not present

## 2013-01-22 DIAGNOSIS — I10 Essential (primary) hypertension: Secondary | ICD-10-CM | POA: Diagnosis not present

## 2013-01-22 DIAGNOSIS — M549 Dorsalgia, unspecified: Secondary | ICD-10-CM | POA: Diagnosis not present

## 2013-01-22 DIAGNOSIS — E119 Type 2 diabetes mellitus without complications: Secondary | ICD-10-CM | POA: Diagnosis not present

## 2013-01-23 ENCOUNTER — Ambulatory Visit (INDEPENDENT_AMBULATORY_CARE_PROVIDER_SITE_OTHER): Payer: Medicare Other | Admitting: Cardiovascular Disease

## 2013-01-23 ENCOUNTER — Encounter: Payer: Self-pay | Admitting: Cardiovascular Disease

## 2013-01-23 VITALS — BP 128/80 | HR 93 | Ht 73.0 in | Wt 264.5 lb

## 2013-01-23 DIAGNOSIS — E785 Hyperlipidemia, unspecified: Secondary | ICD-10-CM

## 2013-01-23 DIAGNOSIS — E119 Type 2 diabetes mellitus without complications: Secondary | ICD-10-CM | POA: Diagnosis not present

## 2013-01-23 DIAGNOSIS — I4892 Unspecified atrial flutter: Secondary | ICD-10-CM | POA: Diagnosis not present

## 2013-01-23 DIAGNOSIS — I4891 Unspecified atrial fibrillation: Secondary | ICD-10-CM | POA: Diagnosis not present

## 2013-01-23 DIAGNOSIS — E291 Testicular hypofunction: Secondary | ICD-10-CM | POA: Diagnosis not present

## 2013-01-23 DIAGNOSIS — I10 Essential (primary) hypertension: Secondary | ICD-10-CM

## 2013-01-23 DIAGNOSIS — R0602 Shortness of breath: Secondary | ICD-10-CM

## 2013-01-23 MED ORDER — DABIGATRAN ETEXILATE MESYLATE 150 MG PO CAPS: 150.0000 mg | ORAL_CAPSULE | Freq: Two times a day (BID) | ORAL | Status: DC

## 2013-01-23 NOTE — Assessment & Plan Note (Signed)
Details of atrial flutter were discussed with him. We'll start him on anticoagulation,pradaxa 150 mg twice a day. Coupon has been given to him for free month. He is relatively asymptomatic. We'll discuss options with him in several weeks' time. If he remains in atrial flutter, options include starting amiodarone in attempt to pharmacologically cardiovert. Other option would be cardioversion. We did discuss ablation as well.

## 2013-01-23 NOTE — Patient Instructions (Addendum)
Please start pradaxa 150 mg twice a day (blood thinner for atrial flutter) Please increase the metoprolol to 50 mg twice a day  Please take lasix as needed for edema  Please call us if you have new issues that need to be addressed before your next appt.  Your physician wants you to follow-up in: 1 months.

## 2013-01-23 NOTE — Assessment & Plan Note (Signed)
He is tolerating pravastatin. Lab work set up for one to 2 months

## 2013-01-23 NOTE — Assessment & Plan Note (Signed)
Breathing is relatively stable. We'll continue to use Lasix for any shortness of breath or ankle edema.

## 2013-01-23 NOTE — Assessment & Plan Note (Signed)
Blood pressure is well controlled on today's visit. No changes made to the medications. 

## 2013-01-23 NOTE — Assessment & Plan Note (Signed)
We have encouraged continued exercise, careful diet management in an effort to lose weight. 

## 2013-01-23 NOTE — Progress Notes (Signed)
Patient ID: TLALOC TADDEI, male    DOB: [birth year], 62 y.o.   MRN: [id number]  HPI Comments: [name] is a very pleasant 62 year old gentleman, patient of Dr. Sullivan Lone, with a history of multiple back surgeries, spinal stenosis , obesity, diabetes , DVT, PE in March 2011 previously on warfarin, 30 year smoking history,  hypertension, depression, and history of renal disease, s/p arthroscopic knee surgery,   presenting for routine followup.  He reports having had a difficult several months. His dog died 2 months ago approximately and this was a very difficult process for him. He has not been sleeping well, appetite has been poor although he has gained weight. He hold off foods and sugars have been high. No significant symptoms of shortness of breath, palpitations or fluttering. No worsening edema. He has been taking Lasix periodically but not on a regular basis. Seen by Dr. Sullivan Lone and found to have arrhythmia.  Previous total cholesterol 171, LDL 56, triglycerides 392, HDL 37  cardiac workup in the 1990s:  cardiac catheterization with "minimal blockage". He does report chronic sweating at nighttime which he attributes to the Vicodin. Previous DVT , he did have shortness of breath.  echocardiogram March 2011 shows normal LV and RV systolic function, mild LVH, diastolic dysfunction, mildly dilated left atrium.  EKG shows atrial flutter with ventricular rate 93 beats per minute   Outpatient Encounter Prescriptions as of 01/23/2013  Medication Sig Dispense Refill  . amitriptyline (ELAVIL) 25 MG tablet Take 25 mg by mouth at bedtime.      [name] aspirin 325 MG tablet Take 325 mg by mouth daily.      . budesonide (RHINOCORT AQUA) 32 MCG/ACT nasal spray Place 1 spray into the nose daily.      . diazepam (VALIUM) 2 MG tablet Take 2 mg by mouth every 6 (six) hours as needed.        . dutasteride (AVODART) 0.5 MG capsule Take 0.5 mg by mouth daily.        [name] esomeprazole (NEXIUM) 40 MG capsule Take 40 mg  by mouth daily before breakfast.        . fexofenadine (ALLEGRA) 180 MG tablet Take 180 mg by mouth daily.        . furosemide (LASIX) 20 MG tablet Take two (20 mg) tablets twice a day.       . gabapentin (NEURONTIN) 800 MG tablet Take 800 mg by mouth 3 (three) times daily.       . hydrochlorothiazide (HYDRODIURIL) 25 MG tablet       . Insulin Glargine (LANTUS SOLOSTAR [place]) Inject into the skin daily.      [name] LIRAGLUTIDE [place] Inject into the skin.        . metFORMIN (GLUCOPHAGE) 1000 MG tablet Take 1,000 mg by mouth daily with breakfast.      . metoprolol (LOPRESSOR) 50 MG tablet Takes 50 mg am and 25 pm daily.      . Multiple Vitamins-Minerals (CENTRUM SILVER ULTRA MENS PO) Take by mouth daily.      . NON FORMULARY Depro shot one every 3 weeks      . Omega-3 Fatty Acids (FISH OIL) 1200 MG CAPS Take 1,200 mg by mouth 2 (two) times daily.      [name] oxyCODONE (OXYCONTIN) 40 MG 12 hr tablet Take 40 mg by mouth 3 (three) times daily.        . pravastatin (PRAVACHOL) 10 MG tablet Take 10 mg by mouth daily.      [name]  ranitidine (ZANTAC) 300 MG tablet Take 300 mg by mouth at bedtime.        . sitaGLIPtan (JANUVIA) 100 MG tablet Take 100 mg by mouth daily.          Review of Systems  Constitutional: Negative.        Obese   HENT: Negative.   Eyes: Negative.   Cardiovascular: Positive for leg swelling.  Gastrointestinal: Negative.   Musculoskeletal: Positive for arthralgias.  Skin: Negative.   Neurological: Negative.   Hematological: Negative.   Psychiatric/Behavioral: Negative.   All other systems reviewed and are negative.   BP 128/80  Ht 6\' 1"  (1.854 m)  Wt 264 lb 8 oz (119.976 kg)  BMI 34.90 kg/m2  Physical Exam  Nursing note and vitals reviewed. Constitutional: He is oriented to person, place, and time. He appears well-developed and well-nourished.  HENT:  Head: Normocephalic.  Nose: Nose normal.  Mouth/Throat: Oropharynx is clear and moist.  Eyes: Conjunctivae normal are normal.  Pupils are equal, round, and reactive to light.  Neck: Normal range of motion. Neck supple. No JVD present.  Cardiovascular: Normal rate, S1 normal, S2 normal, normal heart sounds and intact distal pulses.  An irregularly irregular rhythm present. Exam reveals no gallop and no friction rub.   No murmur heard. Pulmonary/Chest: Effort normal and breath sounds normal. No respiratory distress. He has no wheezes. He has no rales. He exhibits no tenderness.  Abdominal: Soft. Bowel sounds are normal. He exhibits no distension. There is no tenderness.  Musculoskeletal: Normal range of motion. He exhibits no edema and no tenderness.  Lymphadenopathy:    He has no cervical adenopathy.  Neurological: He is alert and oriented to person, place, and time. Coordination normal.  Skin: Skin is warm and dry. No rash noted. No erythema.  Psychiatric: He has a normal mood and affect. His behavior is normal. Judgment and thought content normal.           Assessment and Plan

## 2013-01-30 ENCOUNTER — Telehealth: Payer: Self-pay

## 2013-01-30 NOTE — Telephone Encounter (Signed)
Pt informed samples of pradaxa left at [facility] Understanding verb

## 2013-02-19 DIAGNOSIS — E119 Type 2 diabetes mellitus without complications: Secondary | ICD-10-CM | POA: Diagnosis not present

## 2013-02-19 DIAGNOSIS — Z79899 Other long term (current) drug therapy: Secondary | ICD-10-CM | POA: Diagnosis not present

## 2013-02-19 DIAGNOSIS — E7800 Pure hypercholesterolemia, unspecified: Secondary | ICD-10-CM | POA: Diagnosis not present

## 2013-02-22 DIAGNOSIS — M549 Dorsalgia, unspecified: Secondary | ICD-10-CM | POA: Diagnosis not present

## 2013-02-22 DIAGNOSIS — E785 Hyperlipidemia, unspecified: Secondary | ICD-10-CM | POA: Diagnosis not present

## 2013-02-22 DIAGNOSIS — I10 Essential (primary) hypertension: Secondary | ICD-10-CM | POA: Diagnosis not present

## 2013-02-22 DIAGNOSIS — E119 Type 2 diabetes mellitus without complications: Secondary | ICD-10-CM | POA: Diagnosis not present

## 2013-02-22 DIAGNOSIS — I2510 Atherosclerotic heart disease of native coronary artery without angina pectoris: Secondary | ICD-10-CM | POA: Diagnosis not present

## 2013-02-23 ENCOUNTER — Ambulatory Visit (INDEPENDENT_AMBULATORY_CARE_PROVIDER_SITE_OTHER): Payer: Medicare Other | Admitting: Cardiovascular Disease

## 2013-02-23 ENCOUNTER — Encounter: Payer: Self-pay | Admitting: *Deleted

## 2013-02-23 ENCOUNTER — Encounter: Payer: Self-pay | Admitting: Cardiovascular Disease

## 2013-02-23 VITALS — BP 138/78 | HR 92 | Ht 71.0 in | Wt 256.2 lb

## 2013-02-23 DIAGNOSIS — I4891 Unspecified atrial fibrillation: Secondary | ICD-10-CM

## 2013-02-23 DIAGNOSIS — I4892 Unspecified atrial flutter: Secondary | ICD-10-CM | POA: Diagnosis not present

## 2013-02-23 DIAGNOSIS — R0602 Shortness of breath: Secondary | ICD-10-CM | POA: Diagnosis not present

## 2013-02-23 DIAGNOSIS — E785 Hyperlipidemia, unspecified: Secondary | ICD-10-CM | POA: Diagnosis not present

## 2013-02-23 DIAGNOSIS — E119 Type 2 diabetes mellitus without complications: Secondary | ICD-10-CM

## 2013-02-23 MED ORDER — AMIODARONE HCL 400 MG PO TABS: 400.0000 mg | ORAL_TABLET | Freq: Two times a day (BID) | ORAL | Status: DC

## 2013-02-23 NOTE — Patient Instructions (Addendum)
Please start amiodarone 400 mg twice a day for 5 days, Then on Thursday , decrease the dose to 200 mg twice a day  Come in for EKG in two weeks If you are still in atrial flutter, we would schedule a cardioversion  Please call us if you have new issues that need to be addressed before your next appt.  Your physician wants you to follow-up in: 1 months.

## 2013-02-23 NOTE — Assessment & Plan Note (Signed)
Mild shortness of breath likely secondary to underlying atrial flutter

## 2013-02-23 NOTE — Assessment & Plan Note (Signed)
We'll try pharmacologic cardioversion first, we'll start him on amiodarone 400 mg twice a day for 5 days, then down to 200 mg twice a day with EKG in 2 weeks. If he has not converted to normal sinus rhythm, we will schedule him for DC cardioversion at [facility]

## 2013-02-23 NOTE — Progress Notes (Signed)
Patient ID: Jerry Parsons, male    DOB: [birth year], 62 y.o.   MRN: [id number]  HPI Comments: Jerry Parsons is a very pleasant 62 year old gentleman, patient of Dr. Sullivan Lone, with a history of multiple back surgeries, spinal stenosis , obesity, diabetes , DVT, PE in March 2011 previously on warfarin, 30 year smoking history,  hypertension, depression, and history of renal disease, s/p arthroscopic knee surgery,   presenting for routine followup.  He continues to be in atrial flutter today. No significant symptoms apart from occasional palpitations. Some shortness of breath. 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 He is been doing well the medications. He is interested in getting out of the atrial arrhythmia. He has been on anticoagulation for one month.  Previous total cholesterol 171, LDL 56, triglycerides 392, HDL 37  cardiac workup in the 1990s:  cardiac catheterization with "minimal blockage". He does report chronic sweating at nighttime which he attributes to the Vicodin. Previous DVT , he did have shortness of breath.  echocardiogram March 2011 shows normal LV and RV systolic function, mild LVH, diastolic dysfunction, mildly dilated left atrium.  EKG shows atrial flutter with ventricular rate 92 beats per minute   Outpatient Encounter Prescriptions as of 02/23/2013  Medication Sig Dispense Refill  . amitriptyline (ELAVIL) 25 MG tablet Take 25 mg by mouth at bedtime.      [name] aspirin 325 MG tablet Take 325 mg by mouth daily.      . budesonide (RHINOCORT AQUA) 32 MCG/ACT nasal spray Place 1 spray into the nose daily.      . Canagliflozin (INVOKANA) 100 MG TABS Take by mouth daily.      . cyclobenzaprine (FLEXERIL) 10 MG tablet Take 10 mg by mouth 3 (three) times daily.       . dabigatran (PRADAXA) 150 MG CAPS Take 1 capsule (150 mg total) by mouth every 12 (twelve) hours.  60 capsule  11  . diazepam (VALIUM) 2 MG  tablet Take 2 mg by mouth every 6 (six) hours as needed.        . dutasteride (AVODART) 0.5 MG capsule Take 0.5 mg by mouth daily.        [name] esomeprazole (NEXIUM) 40 MG capsule Take 40 mg by mouth daily before breakfast.        . fexofenadine (ALLEGRA) 180 MG tablet Take 180 mg by mouth daily.        . furosemide (LASIX) 20 MG tablet Take two (20 mg) tablets twice a day.       . gabapentin (NEURONTIN) 800 MG tablet Take 800 mg by mouth 3 (three) times daily.       . hydrochlorothiazide (HYDRODIURIL) 25 MG tablet       . Insulin Glargine (LANTUS SOLOSTAR [place]) Inject into the skin daily.      [name] LIRAGLUTIDE  Inject into the skin.        . metFORMIN (GLUCOPHAGE) 1000 MG tablet Take 1,000 mg by mouth daily with breakfast.      . metoprolol (LOPRESSOR) 50 MG tablet Takes 50 mg am and 25 pm daily.      . Multiple Vitamins-Minerals (CENTRUM SILVER ULTRA MENS PO) Take by mouth daily.      . NON FORMULARY Depro shot one every 3 weeks      . Omega-3 Fatty Acids (FISH OIL) 1200 MG CAPS Take 1,200 mg by mouth 2 (two) times daily.      [name]  oxyCODONE (OXYCONTIN) 40 MG 12 hr tablet Take 40 mg by mouth 3 (three) times daily.        . pravastatin (PRAVACHOL) 10 MG tablet Take 10 mg by mouth daily.      . ranitidine (ZANTAC) 300 MG tablet Take 300 mg by mouth at bedtime.        . sitaGLIPtan (JANUVIA) 100 MG tablet Take 100 mg by mouth daily.        [name] amiodarone (PACERONE) 400 MG tablet Take 1 tablet (400 mg total) by mouth 2 (two) times daily.  60 tablet  3   No facility-administered encounter medications on file as of 02/23/2013.     Review of Systems  Constitutional: Negative.        Obese   HENT: Negative.   Eyes: Negative.   Cardiovascular: Positive for leg swelling.  Gastrointestinal: Negative.   Musculoskeletal: Positive for arthralgias.  Skin: Negative.   Neurological: Negative.   Psychiatric/Behavioral: Negative.   All other systems reviewed and are negative.   BP 138/78  Pulse 92  Ht 5'  11" (1.803 m)  Wt 256 lb 4 oz (116.234 kg)  BMI 35.76 kg/m2  Physical Exam  Nursing note and vitals reviewed. Constitutional: He is oriented to person, place, and time. He appears well-developed and well-nourished.  HENT:  Head: Normocephalic.  Nose: Nose normal.  Mouth/Throat: Oropharynx is clear and moist.  Eyes: Conjunctivae are normal. Pupils are equal, round, and reactive to light.  Neck: Normal range of motion. Neck supple. No JVD present.  Cardiovascular: Normal rate, S1 normal, S2 normal, normal heart sounds and intact distal pulses.  An irregularly irregular rhythm present. Exam reveals no gallop and no friction rub.   No murmur heard. Pulmonary/Chest: Effort normal and breath sounds normal. No respiratory distress. He has no wheezes. He has no rales. He exhibits no tenderness.  Abdominal: Soft. Bowel sounds are normal. He exhibits no distension. There is no tenderness.  Musculoskeletal: Normal range of motion. He exhibits no edema and no tenderness.  Lymphadenopathy:    He has no cervical adenopathy.  Neurological: He is alert and oriented to person, place, and time. Coordination normal.  Skin: Skin is warm and dry. No rash noted. No erythema.  Psychiatric: He has a normal mood and affect. His behavior is normal. Judgment and thought content normal.      Assessment and Plan

## 2013-02-23 NOTE — Assessment & Plan Note (Signed)
We'll try to obtain his most recent lipid panel from Dr. Sullivan Lone

## 2013-02-23 NOTE — Assessment & Plan Note (Signed)
We have encouraged continued exercise, careful diet management in an effort to lose weight. 

## 2013-02-26 ENCOUNTER — Telehealth: Payer: Self-pay | Admitting: *Deleted

## 2013-02-26 NOTE — Telephone Encounter (Signed)
Pt started amiodarone 400 mg BID Saturday 3/1. He says he vomited last night and is making him nauseated. I asked if he is taking this med with food.  HE says he takes it with food in the mornings but not in the evenings (when he gets sick) I advised pt to try taking medication with food to help stomach upset. If this does not help, we may need to go ahead and decrease dose to 200 mg BID. Pt will try taking with food and if no improvement, will decrease to 200 mg BID. I will call pt in 2 days to reassess Understanding verb

## 2013-02-26 NOTE — Telephone Encounter (Signed)
Pt calling stating that he is getting sick on the amiodarone. Pt wants to know what he should do

## 2013-02-28 ENCOUNTER — Telehealth: Payer: Self-pay

## 2013-02-28 NOTE — Telephone Encounter (Signed)
Call pt to asess SE on amiodarone

## 2013-02-28 NOTE — Telephone Encounter (Signed)
Pt says nausea and other symptoms are much improved He will decrease to 200 mg BID dose tomm as prescribed

## 2013-02-28 NOTE — Telephone Encounter (Signed)
Per wife pt still asleep. Will call back later

## 2013-03-01 DIAGNOSIS — E291 Testicular hypofunction: Secondary | ICD-10-CM | POA: Diagnosis not present

## 2013-03-09 ENCOUNTER — Other Ambulatory Visit: Payer: Self-pay

## 2013-03-09 ENCOUNTER — Ambulatory Visit (INDEPENDENT_AMBULATORY_CARE_PROVIDER_SITE_OTHER): Payer: Medicare Other

## 2013-03-09 ENCOUNTER — Ambulatory Visit: Payer: Self-pay | Admitting: Cardiovascular Disease

## 2013-03-09 VITALS — HR 112

## 2013-03-09 DIAGNOSIS — I4892 Unspecified atrial flutter: Secondary | ICD-10-CM

## 2013-03-09 DIAGNOSIS — Z01818 Encounter for other preprocedural examination: Secondary | ICD-10-CM | POA: Diagnosis not present

## 2013-03-09 NOTE — Patient Instructions (Addendum)
Electrical Cardioversion  Cardioversion is the delivery of a jolt of electricity to change the rhythm of the heart. Sticky patches or metal paddles are placed on the chest to deliver the electricity from a special device. This is done to restore a normal rhythm. A rhythm that is too fast or not regular keeps the heart from pumping well.  Compared to medicines used to change an abnormal rhythm, cardioversion is faster and works better. It is also unpleasant and may dislodge blood clots from the heart.  WHEN WOULD THIS BE DONE?   In an emergency:   There is low or no blood pressure as a result of the heart rhythm.   Normal rhythm must be restored as fast as possible to protect the brain and heart from further damage.   It may save a life.   For less serious heart rhythms, such as atrial fibrillation or flutter, in which:   The heart is beating too fast or is not regular.   The heart is still able to pump enough blood, but not as well as it should.   Medicine to change the rhythm has not worked.   It is safe to wait in order to allow time for preparation.  LET YOUR CAREGIVER KNOW ABOUT:    Every medicine you are taking. It is very important to do this! Know when to take or stop taking any of them.   Any time in the past that you have felt your heart was not beating normally.  RISKS AND COMPLICATIONS    Clots may form in the chambers of the heart if it is beating too fast. These clots may be dislodged during the procedure and travel to other parts of the body.   There is risk of a stroke during and after the procedure if a clot moves. Blood thinners lower this risk.   You may have a special test of your heart (TEE) to make sure there are no clots in your heart.  BEFORE THE PROCEDURE    You may have some tests to see how well your heart is working.   You may start taking blood thinners so your blood does not clot as easily.   Other drugs may be given to help your heart work better.  PROCEDURE  (SCHEDULED)   The procedure is typically done in a hospital by a heart doctor (cardiologist).   You will be told when and where to go.   You may be given some medicine through an intravenous (IV) access to reduce discomfort and make you sleepy before the procedure.   Your whole body may move when the shock is delivered. Your chest may feel sore.   You may be able to go home after a few hours. Your heart rhythm will be watched to make sure it does not change.  HOME CARE INSTRUCTIONS    Only take medicine as directed by your caregiver. Be sure you understand how and when to take your medicine.   Learn how to feel your pulse and check it often.   Limit your activity for 48 hours.   Avoid caffeine and other stimulants as directed.  SEEK MEDICAL CARE IF:    You feel like your heart is beating too fast or your pulse is not regular.   You have any questions about your medicines.   You have bleeding that will not stop.  SEEK IMMEDIATE MEDICAL CARE IF:    You are dizzy or feel faint.   It   is hard to breathe or you feel short of breath.   There is a change in discomfort in your chest.   Your speech is slurred or you have trouble moving your arm or leg on one side.   You get a muscle cramp.   Your fingers or toes turn cold or blue.  MAKE SURE YOU:    Understand these instructions.   Will watch your condition.   Will get help right away if you are not doing well or get worse.  Document Released: 12/03/2002 Document Revised: 03/06/2012 Document Reviewed: 04/03/2008  [employer] Patient Information 2013 [employer]

## 2013-03-09 NOTE — Progress Notes (Signed)
Pt here for EKG post amiodarone start  EKG shows pt to still be in atrial flutter at a rate of 112 BPM Per Dr. Windell Hummingbird last note an electrical cardioversion was scheduled for 03/14/13 with Dr. Mariah Milling

## 2013-03-10 LAB — CBC WITH DIFFERENTIAL
Basophils Absolute: 0 10*3/uL (ref 0.0–0.2)
Basos: 0 % (ref 0–3)
Eos: 2 % (ref 0–5)
Eosinophils Absolute: 0.2 10*3/uL (ref 0.0–0.4)
HCT: 48.4 % (ref 37.5–51.0)
Hemoglobin: 16.1 g/dL (ref 12.6–17.7)
Immature Grans (Abs): 0 10*3/uL (ref 0.0–0.1)
Immature Granulocytes: 0 % (ref 0–2)
Lymphocytes Absolute: 1.7 10*3/uL (ref 0.7–3.1)
Lymphs: 16 % (ref 14–46)
MCH: 26.4 pg — ABNORMAL LOW (ref 26.6–33.0)
MCHC: 33.3 g/dL (ref 31.5–35.7)
MCV: 79 fL (ref 79–97)
Monocytes Absolute: 0.6 10*3/uL (ref 0.1–0.9)
Monocytes: 6 % (ref 4–12)
Neutrophils Absolute: 7.7 10*3/uL — ABNORMAL HIGH (ref 1.4–7.0)
Neutrophils Relative %: 76 % — ABNORMAL HIGH (ref 40–74)
Platelets: 159 10*3/uL (ref 155–379)
RBC: 6.1 x10E6/uL — ABNORMAL HIGH (ref 4.14–5.80)
RDW: 15.8 % — ABNORMAL HIGH (ref 12.3–15.4)
WBC: 10.1 10*3/uL (ref 3.4–10.8)

## 2013-03-10 LAB — BASIC METABOLIC PANEL
BUN/Creatinine Ratio: 9 — ABNORMAL LOW (ref 10–22)
BUN: 10 mg/dL (ref 8–27)
CO2: 23 mmol/L (ref 19–28)
Calcium: 9.1 mg/dL (ref 8.6–10.2)
Chloride: 97 mmol/L (ref 97–108)
Creatinine, Ser: 1.13 mg/dL (ref 0.76–1.27)
GFR calc Af Amer: 80 mL/min/{1.73_m2} (ref >59)
GFR calc non Af Amer: 69 mL/min/{1.73_m2} (ref >59)
Glucose: 217 mg/dL — ABNORMAL HIGH (ref 65–99)
Potassium: 4.1 mmol/L (ref 3.5–5.2)
Sodium: 140 mmol/L (ref 134–144)

## 2013-03-10 LAB — PROTIME-INR
INR: 1.3 — ABNORMAL HIGH (ref 0.8–1.2)
Prothrombin Time: 13.7 s — ABNORMAL HIGH (ref 9.1–12.0)

## 2013-03-13 ENCOUNTER — Telehealth: Payer: Self-pay

## 2013-03-13 DIAGNOSIS — I4891 Unspecified atrial fibrillation: Secondary | ICD-10-CM | POA: Diagnosis not present

## 2013-03-13 DIAGNOSIS — E119 Type 2 diabetes mellitus without complications: Secondary | ICD-10-CM | POA: Diagnosis not present

## 2013-03-13 DIAGNOSIS — M549 Dorsalgia, unspecified: Secondary | ICD-10-CM | POA: Diagnosis not present

## 2013-03-13 DIAGNOSIS — I10 Essential (primary) hypertension: Secondary | ICD-10-CM | POA: Diagnosis not present

## 2013-03-13 NOTE — Telephone Encounter (Signed)
Pt wife called and states pt is having cardioversion tomorrow morning, and asks if pt should take his oxycontin tomorrow before procedure. Beverly(wife) states it is ok to leave msg on machinge

## 2013-03-13 NOTE — Telephone Encounter (Signed)
lmom ok to take oxycontin prior to procedure

## 2013-03-14 ENCOUNTER — Ambulatory Visit: Payer: Self-pay | Admitting: Cardiovascular Disease

## 2013-03-14 DIAGNOSIS — F17200 Nicotine dependence, unspecified, uncomplicated: Secondary | ICD-10-CM | POA: Diagnosis not present

## 2013-03-14 DIAGNOSIS — N289 Disorder of kidney and ureter, unspecified: Secondary | ICD-10-CM | POA: Diagnosis not present

## 2013-03-14 DIAGNOSIS — I10 Essential (primary) hypertension: Secondary | ICD-10-CM | POA: Diagnosis not present

## 2013-03-14 DIAGNOSIS — I4892 Unspecified atrial flutter: Secondary | ICD-10-CM | POA: Diagnosis not present

## 2013-03-14 DIAGNOSIS — M4800 Spinal stenosis, site unspecified: Secondary | ICD-10-CM | POA: Diagnosis not present

## 2013-03-14 DIAGNOSIS — E669 Obesity, unspecified: Secondary | ICD-10-CM | POA: Diagnosis not present

## 2013-03-14 DIAGNOSIS — I4891 Unspecified atrial fibrillation: Secondary | ICD-10-CM | POA: Diagnosis not present

## 2013-03-14 DIAGNOSIS — Z6835 Body mass index (BMI) 35.0-35.9, adult: Secondary | ICD-10-CM | POA: Diagnosis not present

## 2013-03-14 DIAGNOSIS — I4510 Unspecified right bundle-branch block: Secondary | ICD-10-CM | POA: Diagnosis not present

## 2013-03-14 DIAGNOSIS — Z86718 Personal history of other venous thrombosis and embolism: Secondary | ICD-10-CM | POA: Diagnosis not present

## 2013-03-14 DIAGNOSIS — E119 Type 2 diabetes mellitus without complications: Secondary | ICD-10-CM | POA: Diagnosis not present

## 2013-03-14 DIAGNOSIS — E785 Hyperlipidemia, unspecified: Secondary | ICD-10-CM | POA: Diagnosis not present

## 2013-03-14 DIAGNOSIS — Z79899 Other long term (current) drug therapy: Secondary | ICD-10-CM | POA: Diagnosis not present

## 2013-03-14 DIAGNOSIS — R0602 Shortness of breath: Secondary | ICD-10-CM | POA: Diagnosis not present

## 2013-03-15 DIAGNOSIS — E291 Testicular hypofunction: Secondary | ICD-10-CM | POA: Diagnosis not present

## 2013-03-26 ENCOUNTER — Encounter: Payer: Self-pay | Admitting: Cardiovascular Disease

## 2013-03-26 ENCOUNTER — Ambulatory Visit (INDEPENDENT_AMBULATORY_CARE_PROVIDER_SITE_OTHER): Payer: Medicare Other | Admitting: Cardiovascular Disease

## 2013-03-26 VITALS — BP 158/78 | HR 95 | Ht 71.0 in | Wt 263.5 lb

## 2013-03-26 DIAGNOSIS — I4892 Unspecified atrial flutter: Secondary | ICD-10-CM | POA: Diagnosis not present

## 2013-03-26 DIAGNOSIS — I10 Essential (primary) hypertension: Secondary | ICD-10-CM

## 2013-03-26 DIAGNOSIS — E119 Type 2 diabetes mellitus without complications: Secondary | ICD-10-CM | POA: Diagnosis not present

## 2013-03-26 DIAGNOSIS — E785 Hyperlipidemia, unspecified: Secondary | ICD-10-CM | POA: Diagnosis not present

## 2013-03-26 DIAGNOSIS — R0602 Shortness of breath: Secondary | ICD-10-CM

## 2013-03-26 MED ORDER — METOPROLOL TARTRATE 50 MG PO TABS: 75.0000 mg | ORAL_TABLET | Freq: Two times a day (BID) | ORAL | Status: DC

## 2013-03-26 NOTE — Assessment & Plan Note (Signed)
We have suggested he stay on his pravastatin. Most recent labs not available

## 2013-03-26 NOTE — Assessment & Plan Note (Signed)
We have encouraged continued exercise, careful diet management in an effort to lose weight. 

## 2013-03-26 NOTE — Patient Instructions (Addendum)
You are doing well. Please increase the metoprolol to 75 mg twice a day Continue on amiodarone 200 mg twice a day  Please call us if you have new issues that need to be addressed before your next appt.  Your physician wants you to follow-up in: 1 months.

## 2013-03-26 NOTE — Progress Notes (Signed)
Patient ID: Jerry Parsons, male    DOB: [birth year], 62 y.o.   MRN: [id number]  HPI Comments: Jerry Parsons is a very pleasant 62 year old gentleman, patient of Dr. Sullivan Parsons, with a history of multiple back surgeries, spinal stenosis , obesity, diabetes , DVT, PE in March 2011 previously on warfarin, 30 year smoking history,  hypertension, depression, and history of renal disease, s/p arthroscopic knee surgery,   presenting for routine followup.  For atrial flutter, we performed a cardioversion on 03/14/2013. He presents today in followup. Continues on amiodarone 200 mg twice a day, metoprolol tartrate 50 mg twice a day. He is hot today, heart rate is mildly elevated. No significant shortness of breath. Blood pressure has been running in a good range at home with systolic pressures in the 120 range.he continues on anticoagulation, paradaxa 150 mg twice a day.  Previous total cholesterol 171, LDL 56, triglycerides 392, HDL 37  cardiac workup in the 1990s:  cardiac catheterization with "minimal blockage". He does report chronic sweating at nighttime which he attributes to the Vicodin. Previous DVT , he did have shortness of breath.  echocardiogram March 2011 shows normal LV and RV systolic function, mild LVH, diastolic dysfunction, mildly dilated left atrium.  EKG shows atrial flutter with ventricular rate 95 beats per minute   Outpatient Encounter Prescriptions as of 03/26/2013  Medication Sig Dispense Refill  . amiodarone (PACERONE) 400 MG tablet Take 1 tablet (400 mg total) by mouth 2 (two) times daily.  60 tablet  3  . amitriptyline (ELAVIL) 25 MG tablet Take 25 mg by mouth at bedtime.      [name] aspirin 325 MG tablet Take 325 mg by mouth daily.      . budesonide (RHINOCORT AQUA) 32 MCG/ACT nasal spray Place 1 spray into the nose daily as needed.       . Canagliflozin (INVOKANA) 100 MG TABS Take by mouth daily.      . cyclobenzaprine (FLEXERIL) 10 MG tablet Take 10 mg by mouth 3 (three) times  daily.       . dabigatran (PRADAXA) 150 MG CAPS Take 1 capsule (150 mg total) by mouth every 12 (twelve) hours.  60 capsule  11  . diazepam (VALIUM) 2 MG tablet Take 2 mg by mouth every 8 (eight) hours as needed.       . dutasteride (AVODART) 0.5 MG capsule Take 0.5 mg by mouth daily.        [name] esomeprazole (NEXIUM) 40 MG capsule Take 40 mg by mouth daily before breakfast.        . fexofenadine (ALLEGRA) 180 MG tablet Take 180 mg by mouth daily.        . furosemide (LASIX) 20 MG tablet Take two (20 mg) tablets twice a day.       . gabapentin (NEURONTIN) 800 MG tablet Take 800 mg by mouth 3 (three) times daily.       . hydrochlorothiazide (HYDRODIURIL) 25 MG tablet Take 25 mg by mouth daily.       . Insulin Glargine (LANTUS SOLOSTAR [place]) Inject into the skin daily.      [name] LIRAGLUTIDE [place] Inject into the skin.        . metFORMIN (GLUCOPHAGE) 1000 MG tablet Take 1,000 mg by mouth 2 (two) times daily with a meal.       . metoprolol (LOPRESSOR) 50 MG tablet Take 1.5 tablets (75 mg total) by mouth 2 (two) times daily.  90 tablet  11  . Multiple  Vitamins-Minerals (CENTRUM SILVER ULTRA MENS PO) Take by mouth daily.      . NON FORMULARY Depro shot one every 2 weeks      . Omega-3 Fatty Acids (FISH OIL) 1200 MG CAPS Take 1,200 mg by mouth 2 (two) times daily.      [name] oxyCODONE (OXYCONTIN) 40 MG 12 hr tablet Take 40 mg by mouth 3 (three) times daily.        . pravastatin (PRAVACHOL) 10 MG tablet Take 10 mg by mouth daily.      . ranitidine (ZANTAC) 300 MG tablet Take 300 mg by mouth at bedtime.        . sitaGLIPtan (JANUVIA) 100 MG tablet Take 100 mg by mouth daily.        . [DISCONTINUED] metoprolol (LOPRESSOR) 50 MG tablet Take 50 mg by mouth 2 (two) times daily.        No facility-administered encounter medications on file as of 03/26/2013.     Review of Systems  Constitutional: Negative.        Obese   HENT: Negative.   Eyes: Negative.   Gastrointestinal: Negative.   Musculoskeletal: Positive for  arthralgias.  Skin: Negative.   Neurological: Negative.   Psychiatric/Behavioral: Negative.   All other systems reviewed and are negative.   BP 158/78  Pulse 95  Ht 5\' 11"  (1.803 m)  Wt 263 lb 8 oz (119.523 kg)  BMI 36.77 kg/m2  Physical Exam  Nursing note and vitals reviewed. Constitutional: He is oriented to person, place, and time. He appears well-developed and well-nourished.  HENT:  Head: Normocephalic.  Nose: Nose normal.  Mouth/Throat: Oropharynx is clear and moist.  Eyes: Conjunctivae are normal. Pupils are equal, round, and reactive to light.  Neck: Normal range of motion. Neck supple. No JVD present.  Cardiovascular: Normal rate, S1 normal, S2 normal, normal heart sounds and intact distal pulses.  An irregularly irregular rhythm present. Exam reveals no gallop and no friction rub.   No murmur heard. Pulmonary/Chest: Effort normal and breath sounds normal. No respiratory distress. He has no wheezes. He has no rales. He exhibits no tenderness.  Abdominal: Soft. Bowel sounds are normal. He exhibits no distension. There is no tenderness.  Musculoskeletal: Normal range of motion. He exhibits no edema and no tenderness.  Lymphadenopathy:    He has no cervical adenopathy.  Neurological: He is alert and oriented to person, place, and time. Coordination normal.  Skin: Skin is warm and dry. No rash noted. No erythema.  Psychiatric: He has a normal mood and affect. His behavior is normal. Judgment and thought content normal.      Assessment and Plan

## 2013-03-26 NOTE — Assessment & Plan Note (Addendum)
Maintaining normal sinus rhythm today. We'll increase his metoprolol to 75 mg twice a day, continue amiodarone twice a day for now. We'll decrease the amiodarone down to 1 a day in the next few weeks. Continue anticoagulation with pradaxa.

## 2013-03-26 NOTE — Assessment & Plan Note (Signed)
Some improvement in shortness of breath on restoring normal sinus rhythm. He is moderately obese and deconditioned.

## 2013-03-29 DIAGNOSIS — E291 Testicular hypofunction: Secondary | ICD-10-CM | POA: Diagnosis not present

## 2013-03-30 ENCOUNTER — Telehealth: Payer: Self-pay

## 2013-03-30 NOTE — Telephone Encounter (Signed)
Pt was informed He will call Dr. Elisabeth Cara office now to see about getting an appt today He tells me he has tried immodium x 4 pills at a time before with no relief He has not had diarrhea today He will call us back should PCP not be able to help him

## 2013-03-30 NOTE — Telephone Encounter (Signed)
Pt calling to say he has been taking Pradaxa since January Since January he has had severe diarrhea, usually beginning in afternoons,  And is "constant" and very "loose" from that point forward He also c/o severe flatulence and blood on toilet paper when he wipes due to the fact that he is having to go to the bathroom so much He mentions his stools are "dark" almost "black" He did not mention any of these symptoms at OV 3/31 He says he was unaware that Pradaxa could cause this but has narrowed this down to this medication and when this was started Denies vomiting, admits to some nausea at times He has already taken Pradaxa this am I will discuss with Dr. Mariah Milling and give him further instruction Understanding verb

## 2013-03-30 NOTE — Telephone Encounter (Signed)
I discussed with Dr. Mariah Milling who says pt is at high risk for holding his Pradaxa  He needs to take this another few weeks Last CBC revealed Hgb=16.1, so Dr. Mariah Milling asks that I reassure pt it does not appear he is losing blood, unsure where dark stools are coming from He asks that I suggest to pt to try lomotil, fiber, etc. He says it may be another medication causing the diarrhea.  I will call pt to advise. I may also advise pt to f./u with PCP for chronic diarrhea

## 2013-04-02 ENCOUNTER — Inpatient Hospital Stay: Payer: Self-pay | Admitting: Internal Medicine

## 2013-04-02 DIAGNOSIS — B951 Streptococcus, group B, as the cause of diseases classified elsewhere: Secondary | ICD-10-CM | POA: Diagnosis not present

## 2013-04-02 DIAGNOSIS — G589 Mononeuropathy, unspecified: Secondary | ICD-10-CM | POA: Diagnosis present

## 2013-04-02 DIAGNOSIS — K5289 Other specified noninfective gastroenteritis and colitis: Secondary | ICD-10-CM | POA: Diagnosis not present

## 2013-04-02 DIAGNOSIS — F5089 Other specified eating disorder: Secondary | ICD-10-CM | POA: Diagnosis present

## 2013-04-02 DIAGNOSIS — R05 Cough: Secondary | ICD-10-CM | POA: Diagnosis not present

## 2013-04-02 DIAGNOSIS — R109 Unspecified abdominal pain: Secondary | ICD-10-CM | POA: Diagnosis present

## 2013-04-02 DIAGNOSIS — R5381 Other malaise: Secondary | ICD-10-CM | POA: Diagnosis not present

## 2013-04-02 DIAGNOSIS — Z79899 Other long term (current) drug therapy: Secondary | ICD-10-CM | POA: Diagnosis not present

## 2013-04-02 DIAGNOSIS — R6510 Systemic inflammatory response syndrome (SIRS) of non-infectious origin without acute organ dysfunction: Secondary | ICD-10-CM | POA: Diagnosis not present

## 2013-04-02 DIAGNOSIS — Z9089 Acquired absence of other organs: Secondary | ICD-10-CM | POA: Diagnosis not present

## 2013-04-02 DIAGNOSIS — Z86711 Personal history of pulmonary embolism: Secondary | ICD-10-CM | POA: Diagnosis not present

## 2013-04-02 DIAGNOSIS — R1084 Generalized abdominal pain: Secondary | ICD-10-CM | POA: Diagnosis not present

## 2013-04-02 DIAGNOSIS — A045 Campylobacter enteritis: Secondary | ICD-10-CM | POA: Diagnosis not present

## 2013-04-02 DIAGNOSIS — I4891 Unspecified atrial fibrillation: Secondary | ICD-10-CM | POA: Diagnosis not present

## 2013-04-02 DIAGNOSIS — R61 Generalized hyperhidrosis: Secondary | ICD-10-CM | POA: Diagnosis not present

## 2013-04-02 DIAGNOSIS — A419 Sepsis, unspecified organism: Secondary | ICD-10-CM | POA: Diagnosis not present

## 2013-04-02 DIAGNOSIS — Z86718 Personal history of other venous thrombosis and embolism: Secondary | ICD-10-CM | POA: Diagnosis not present

## 2013-04-02 DIAGNOSIS — A09 Infectious gastroenteritis and colitis, unspecified: Secondary | ICD-10-CM | POA: Diagnosis not present

## 2013-04-02 DIAGNOSIS — Z87891 Personal history of nicotine dependence: Secondary | ICD-10-CM | POA: Diagnosis not present

## 2013-04-02 DIAGNOSIS — K649 Unspecified hemorrhoids: Secondary | ICD-10-CM | POA: Diagnosis present

## 2013-04-02 DIAGNOSIS — R6889 Other general symptoms and signs: Secondary | ICD-10-CM | POA: Diagnosis not present

## 2013-04-02 DIAGNOSIS — R5383 Other fatigue: Secondary | ICD-10-CM | POA: Diagnosis not present

## 2013-04-02 DIAGNOSIS — E119 Type 2 diabetes mellitus without complications: Secondary | ICD-10-CM | POA: Diagnosis not present

## 2013-04-02 DIAGNOSIS — I10 Essential (primary) hypertension: Secondary | ICD-10-CM | POA: Diagnosis not present

## 2013-04-02 DIAGNOSIS — R197 Diarrhea, unspecified: Secondary | ICD-10-CM | POA: Diagnosis not present

## 2013-04-02 DIAGNOSIS — R059 Cough, unspecified: Secondary | ICD-10-CM | POA: Diagnosis not present

## 2013-04-02 DIAGNOSIS — E785 Hyperlipidemia, unspecified: Secondary | ICD-10-CM | POA: Diagnosis not present

## 2013-04-02 DIAGNOSIS — R1032 Left lower quadrant pain: Secondary | ICD-10-CM | POA: Diagnosis not present

## 2013-04-02 DIAGNOSIS — Z8051 Family history of malignant neoplasm of kidney: Secondary | ICD-10-CM | POA: Diagnosis not present

## 2013-04-02 DIAGNOSIS — R42 Dizziness and giddiness: Secondary | ICD-10-CM | POA: Diagnosis not present

## 2013-04-02 DIAGNOSIS — Z794 Long term (current) use of insulin: Secondary | ICD-10-CM | POA: Diagnosis not present

## 2013-04-02 DIAGNOSIS — E860 Dehydration: Secondary | ICD-10-CM | POA: Diagnosis not present

## 2013-04-02 DIAGNOSIS — G4730 Sleep apnea, unspecified: Secondary | ICD-10-CM | POA: Diagnosis present

## 2013-04-02 DIAGNOSIS — Z7901 Long term (current) use of anticoagulants: Secondary | ICD-10-CM | POA: Diagnosis not present

## 2013-04-02 DIAGNOSIS — N400 Enlarged prostate without lower urinary tract symptoms: Secondary | ICD-10-CM | POA: Diagnosis present

## 2013-04-02 DIAGNOSIS — H5440 Blindness, one eye, unspecified eye: Secondary | ICD-10-CM | POA: Diagnosis present

## 2013-04-02 DIAGNOSIS — K219 Gastro-esophageal reflux disease without esophagitis: Secondary | ICD-10-CM | POA: Diagnosis present

## 2013-04-02 LAB — CBC WITH DIFFERENTIAL/PLATELET
Basophil #: 0 10*3/uL (ref 0.0–0.1)
Basophil %: 0.2 %
Eosinophil #: 0.1 10*3/uL (ref 0.0–0.7)
Eosinophil %: 0.5 %
HCT: 52 % (ref 40.0–52.0)
HGB: 17.1 g/dL (ref 13.0–18.0)
Lymphocyte #: 1.2 10*3/uL (ref 1.0–3.6)
Lymphocyte %: 6.7 %
MCH: 26.3 pg (ref 26.0–34.0)
MCHC: 32.9 g/dL (ref 32.0–36.0)
MCV: 80 fL (ref 80–100)
Monocyte #: 1.5 x10 3/mm — ABNORMAL HIGH (ref 0.2–1.0)
Monocyte %: 8.3 %
Neutrophil #: 14.8 10*3/uL — ABNORMAL HIGH (ref 1.4–6.5)
Neutrophil %: 84.3 %
Platelet: 205 10*3/uL (ref 150–440)
RBC: 6.49 10*6/uL — ABNORMAL HIGH (ref 4.40–5.90)
RDW: 17 % — ABNORMAL HIGH (ref 11.5–14.5)
WBC: 17.5 10*3/uL — ABNORMAL HIGH (ref 3.8–10.6)

## 2013-04-02 LAB — COMPREHENSIVE METABOLIC PANEL
Albumin: 3.7 g/dL (ref 3.4–5.0)
Alkaline Phosphatase: 112 U/L (ref 50–136)
Anion Gap: 7 (ref 7–16)
BUN: 21 mg/dL — ABNORMAL HIGH (ref 7–18)
Bilirubin,Total: 0.7 mg/dL (ref 0.2–1.0)
Calcium, Total: 9.9 mg/dL (ref 8.5–10.1)
Chloride: 98 mmol/L (ref 98–107)
Co2: 30 mmol/L (ref 21–32)
Creatinine: 1.26 mg/dL (ref 0.60–1.30)
EGFR (African American): >60
EGFR (Non-African Amer.): >60
Glucose: 210 mg/dL — ABNORMAL HIGH (ref 65–99)
Osmolality: 279 (ref 275–301)
Potassium: 4.1 mmol/L (ref 3.5–5.1)
SGOT(AST): 41 U/L — ABNORMAL HIGH (ref 15–37)
SGPT (ALT): 26 U/L (ref 12–78)
Sodium: 135 mmol/L — ABNORMAL LOW (ref 136–145)
Total Protein: 8.2 g/dL (ref 6.4–8.2)

## 2013-04-02 LAB — URINALYSIS, COMPLETE
Bacteria: NONE SEEN
Bilirubin,UR: NEGATIVE
Blood: NEGATIVE
Glucose,UR: >=500 mg/dL (ref 0–75)
Leukocyte Esterase: NEGATIVE
Nitrite: NEGATIVE
Ph: 6 (ref 4.5–8.0)
Protein: NEGATIVE
RBC,UR: 3 /HPF (ref 0–5)
Specific Gravity: 1.029 (ref 1.003–1.030)
Squamous Epithelial: <1
WBC UR: 1 /HPF (ref 0–5)

## 2013-04-02 LAB — TROPONIN I
Troponin-I: <0.02 ng/mL
Troponin-I: <0.02 ng/mL

## 2013-04-02 LAB — PROTIME-INR
INR: 1.2
Prothrombin Time: 15.6 secs — ABNORMAL HIGH (ref 11.5–14.7)

## 2013-04-02 LAB — CK-MB: CK-MB: <0.5 ng/mL — ABNORMAL LOW (ref 0.5–3.6)

## 2013-04-02 LAB — LIPASE, BLOOD: Lipase: 69 U/L — ABNORMAL LOW (ref 73–393)

## 2013-04-02 LAB — CK: CK, Total: 121 U/L (ref 35–232)

## 2013-04-03 LAB — BASIC METABOLIC PANEL
Anion Gap: 3 — ABNORMAL LOW (ref 7–16)
BUN: 19 mg/dL — ABNORMAL HIGH (ref 7–18)
Calcium, Total: 8.3 mg/dL — ABNORMAL LOW (ref 8.5–10.1)
Chloride: 100 mmol/L (ref 98–107)
Co2: 34 mmol/L — ABNORMAL HIGH (ref 21–32)
Creatinine: 1.28 mg/dL (ref 0.60–1.30)
EGFR (African American): >60
EGFR (Non-African Amer.): 59 — ABNORMAL LOW
Glucose: 147 mg/dL — ABNORMAL HIGH (ref 65–99)
Osmolality: 279 (ref 275–301)
Potassium: 3.7 mmol/L (ref 3.5–5.1)
Sodium: 137 mmol/L (ref 136–145)

## 2013-04-03 LAB — OCCULT BLOOD X 1 CARD TO LAB, STOOL: Occult Blood, Feces: NEGATIVE

## 2013-04-03 LAB — CBC WITH DIFFERENTIAL/PLATELET
Basophil #: 0 10*3/uL (ref 0.0–0.1)
Basophil %: 0.3 %
Eosinophil #: 0.2 10*3/uL (ref 0.0–0.7)
Eosinophil %: 1.7 %
HCT: 43.9 % (ref 40.0–52.0)
HGB: 14.3 g/dL (ref 13.0–18.0)
Lymphocyte #: 1.3 10*3/uL (ref 1.0–3.6)
Lymphocyte %: 10.8 %
MCH: 26 pg (ref 26.0–34.0)
MCHC: 32.6 g/dL (ref 32.0–36.0)
MCV: 80 fL (ref 80–100)
Monocyte #: 1.1 x10 3/mm — ABNORMAL HIGH (ref 0.2–1.0)
Monocyte %: 9.3 %
Neutrophil #: 9.3 10*3/uL — ABNORMAL HIGH (ref 1.4–6.5)
Neutrophil %: 77.9 %
Platelet: 170 10*3/uL (ref 150–440)
RBC: 5.49 10*6/uL (ref 4.40–5.90)
RDW: 17.2 % — ABNORMAL HIGH (ref 11.5–14.5)
WBC: 11.9 10*3/uL — ABNORMAL HIGH (ref 3.8–10.6)

## 2013-04-03 LAB — LIPID PANEL
Cholesterol: 93 mg/dL (ref 0–200)
HDL Cholesterol: 25 mg/dL — ABNORMAL LOW (ref 40–60)
Ldl Cholesterol, Calc: 35 mg/dL (ref 0–100)
Triglycerides: 163 mg/dL (ref 0–200)
VLDL Cholesterol, Calc: 33 mg/dL (ref 5–40)

## 2013-04-03 LAB — TROPONIN I: Troponin-I: <0.02 ng/mL

## 2013-04-03 LAB — TSH: Thyroid Stimulating Horm: 2.21 u[IU]/mL

## 2013-04-04 LAB — WBCS, STOOL

## 2013-04-04 LAB — URINE CULTURE

## 2013-04-07 LAB — STOOL CULTURE

## 2013-04-08 LAB — CULTURE, BLOOD (SINGLE)

## 2013-04-09 DIAGNOSIS — R198 Other specified symptoms and signs involving the digestive system and abdomen: Secondary | ICD-10-CM | POA: Diagnosis not present

## 2013-04-09 DIAGNOSIS — K219 Gastro-esophageal reflux disease without esophagitis: Secondary | ICD-10-CM | POA: Diagnosis not present

## 2013-04-11 ENCOUNTER — Other Ambulatory Visit: Payer: Self-pay | Admitting: Unknown Physician Specialty

## 2013-04-11 ENCOUNTER — Ambulatory Visit: Payer: Self-pay | Admitting: Unknown Physician Specialty

## 2013-04-11 DIAGNOSIS — K219 Gastro-esophageal reflux disease without esophagitis: Secondary | ICD-10-CM | POA: Diagnosis not present

## 2013-04-11 DIAGNOSIS — R197 Diarrhea, unspecified: Secondary | ICD-10-CM | POA: Diagnosis not present

## 2013-04-11 DIAGNOSIS — E291 Testicular hypofunction: Secondary | ICD-10-CM | POA: Diagnosis not present

## 2013-04-11 LAB — CLOSTRIDIUM DIFFICILE BY PCR

## 2013-04-12 ENCOUNTER — Observation Stay: Payer: Self-pay | Admitting: Internal Medicine

## 2013-04-12 DIAGNOSIS — R42 Dizziness and giddiness: Secondary | ICD-10-CM | POA: Diagnosis not present

## 2013-04-12 DIAGNOSIS — A045 Campylobacter enteritis: Secondary | ICD-10-CM | POA: Diagnosis not present

## 2013-04-12 DIAGNOSIS — Z86718 Personal history of other venous thrombosis and embolism: Secondary | ICD-10-CM | POA: Diagnosis not present

## 2013-04-12 DIAGNOSIS — K219 Gastro-esophageal reflux disease without esophagitis: Secondary | ICD-10-CM | POA: Diagnosis not present

## 2013-04-12 DIAGNOSIS — Z801 Family history of malignant neoplasm of trachea, bronchus and lung: Secondary | ICD-10-CM | POA: Diagnosis not present

## 2013-04-12 DIAGNOSIS — M549 Dorsalgia, unspecified: Secondary | ICD-10-CM | POA: Diagnosis not present

## 2013-04-12 DIAGNOSIS — E1149 Type 2 diabetes mellitus with other diabetic neurological complication: Secondary | ICD-10-CM | POA: Diagnosis not present

## 2013-04-12 DIAGNOSIS — G8929 Other chronic pain: Secondary | ICD-10-CM | POA: Diagnosis not present

## 2013-04-12 DIAGNOSIS — Z87891 Personal history of nicotine dependence: Secondary | ICD-10-CM | POA: Diagnosis not present

## 2013-04-12 DIAGNOSIS — Z794 Long term (current) use of insulin: Secondary | ICD-10-CM | POA: Diagnosis not present

## 2013-04-12 DIAGNOSIS — R197 Diarrhea, unspecified: Secondary | ICD-10-CM | POA: Diagnosis not present

## 2013-04-12 DIAGNOSIS — R5383 Other fatigue: Secondary | ICD-10-CM | POA: Diagnosis not present

## 2013-04-12 DIAGNOSIS — Z803 Family history of malignant neoplasm of breast: Secondary | ICD-10-CM | POA: Diagnosis not present

## 2013-04-12 DIAGNOSIS — Z7982 Long term (current) use of aspirin: Secondary | ICD-10-CM | POA: Diagnosis not present

## 2013-04-12 DIAGNOSIS — I4891 Unspecified atrial fibrillation: Secondary | ICD-10-CM | POA: Diagnosis not present

## 2013-04-12 DIAGNOSIS — E7800 Pure hypercholesterolemia, unspecified: Secondary | ICD-10-CM | POA: Diagnosis not present

## 2013-04-12 DIAGNOSIS — Z8051 Family history of malignant neoplasm of kidney: Secondary | ICD-10-CM | POA: Diagnosis not present

## 2013-04-12 DIAGNOSIS — R5381 Other malaise: Secondary | ICD-10-CM | POA: Diagnosis not present

## 2013-04-12 DIAGNOSIS — E785 Hyperlipidemia, unspecified: Secondary | ICD-10-CM | POA: Diagnosis not present

## 2013-04-12 DIAGNOSIS — E1142 Type 2 diabetes mellitus with diabetic polyneuropathy: Secondary | ICD-10-CM | POA: Diagnosis not present

## 2013-04-12 DIAGNOSIS — N400 Enlarged prostate without lower urinary tract symptoms: Secondary | ICD-10-CM | POA: Diagnosis not present

## 2013-04-12 DIAGNOSIS — Z79899 Other long term (current) drug therapy: Secondary | ICD-10-CM | POA: Diagnosis not present

## 2013-04-12 LAB — URINALYSIS, COMPLETE
Bacteria: NONE SEEN
Bilirubin,UR: NEGATIVE
Blood: NEGATIVE
Glucose,UR: >=500 mg/dL (ref 0–75)
Ketone: NEGATIVE
Leukocyte Esterase: NEGATIVE
Nitrite: NEGATIVE
Ph: 7 (ref 4.5–8.0)
Protein: NEGATIVE
RBC,UR: 1 /HPF (ref 0–5)
Specific Gravity: 1.015 (ref 1.003–1.030)
Squamous Epithelial: 1
WBC UR: 1 /HPF (ref 0–5)

## 2013-04-12 LAB — COMPREHENSIVE METABOLIC PANEL
Albumin: 3.3 g/dL — ABNORMAL LOW (ref 3.4–5.0)
Alkaline Phosphatase: 99 U/L (ref 50–136)
Anion Gap: 3 — ABNORMAL LOW (ref 7–16)
BUN: 11 mg/dL (ref 7–18)
Bilirubin,Total: 0.5 mg/dL (ref 0.2–1.0)
Calcium, Total: 8.8 mg/dL (ref 8.5–10.1)
Chloride: 104 mmol/L (ref 98–107)
Co2: 30 mmol/L (ref 21–32)
Creatinine: 0.98 mg/dL (ref 0.60–1.30)
EGFR (African American): >60
EGFR (Non-African Amer.): >60
Glucose: 146 mg/dL — ABNORMAL HIGH (ref 65–99)
Osmolality: 276 (ref 275–301)
Potassium: 4 mmol/L (ref 3.5–5.1)
SGOT(AST): 22 U/L (ref 15–37)
SGPT (ALT): 29 U/L (ref 12–78)
Sodium: 137 mmol/L (ref 136–145)
Total Protein: 6.9 g/dL (ref 6.4–8.2)

## 2013-04-12 LAB — CBC
HCT: 45.6 % (ref 40.0–52.0)
HGB: 14.6 g/dL (ref 13.0–18.0)
MCH: 25.5 pg — ABNORMAL LOW (ref 26.0–34.0)
MCHC: 32 g/dL (ref 32.0–36.0)
MCV: 80 fL (ref 80–100)
Platelet: 203 10*3/uL (ref 150–440)
RBC: 5.74 10*6/uL (ref 4.40–5.90)
RDW: 17.3 % — ABNORMAL HIGH (ref 11.5–14.5)
WBC: 12.2 10*3/uL — ABNORMAL HIGH (ref 3.8–10.6)

## 2013-04-12 LAB — CK TOTAL AND CKMB (NOT AT ARMC)
CK, Total: 49 U/L (ref 35–232)
CK-MB: 1.1 ng/mL (ref 0.5–3.6)

## 2013-04-12 LAB — TROPONIN I: Troponin-I: <0.02 ng/mL

## 2013-04-13 DIAGNOSIS — R197 Diarrhea, unspecified: Secondary | ICD-10-CM | POA: Diagnosis not present

## 2013-04-13 DIAGNOSIS — A045 Campylobacter enteritis: Secondary | ICD-10-CM | POA: Diagnosis not present

## 2013-04-13 DIAGNOSIS — R42 Dizziness and giddiness: Secondary | ICD-10-CM | POA: Diagnosis not present

## 2013-04-13 DIAGNOSIS — I4891 Unspecified atrial fibrillation: Secondary | ICD-10-CM | POA: Diagnosis not present

## 2013-04-14 LAB — STOOL CULTURE

## 2013-04-17 DIAGNOSIS — R197 Diarrhea, unspecified: Secondary | ICD-10-CM | POA: Diagnosis not present

## 2013-04-25 ENCOUNTER — Ambulatory Visit (INDEPENDENT_AMBULATORY_CARE_PROVIDER_SITE_OTHER): Payer: Medicare Other | Admitting: Cardiovascular Disease

## 2013-04-25 ENCOUNTER — Encounter: Payer: Self-pay | Admitting: Cardiovascular Disease

## 2013-04-25 VITALS — BP 124/90 | HR 66 | Ht 71.0 in | Wt 257.8 lb

## 2013-04-25 DIAGNOSIS — I10 Essential (primary) hypertension: Secondary | ICD-10-CM

## 2013-04-25 DIAGNOSIS — E669 Obesity, unspecified: Secondary | ICD-10-CM | POA: Diagnosis not present

## 2013-04-25 DIAGNOSIS — E785 Hyperlipidemia, unspecified: Secondary | ICD-10-CM

## 2013-04-25 DIAGNOSIS — E291 Testicular hypofunction: Secondary | ICD-10-CM | POA: Diagnosis not present

## 2013-04-25 DIAGNOSIS — A045 Campylobacter enteritis: Secondary | ICD-10-CM

## 2013-04-25 DIAGNOSIS — I4892 Unspecified atrial flutter: Secondary | ICD-10-CM | POA: Diagnosis not present

## 2013-04-25 MED ORDER — AMIODARONE HCL 200 MG PO TABS: 200.0000 mg | ORAL_TABLET | Freq: Two times a day (BID) | ORAL | Status: DC

## 2013-04-25 NOTE — Assessment & Plan Note (Signed)
Blood pressure is well controlled on today's visit. No changes made to the medications. 

## 2013-04-25 NOTE — Assessment & Plan Note (Signed)
We have encouraged continued exercise, careful diet management in an effort to lose weight. 

## 2013-04-25 NOTE — Assessment & Plan Note (Signed)
Resolved after antibiotics. Likely from tainted chicken

## 2013-04-25 NOTE — Progress Notes (Signed)
Patient ID: Jerry Parsons, male    DOB: March 16, 1951, 62 y.o.   MRN: [id number]  HPI Comments: Jerry Parsons is a very pleasant 62 year old gentleman, patient of Dr. Sullivan Lone, with a history of multiple back surgeries, spinal stenosis , obesity, diabetes , DVT, PE in March 2011 previously on warfarin, 30 year smoking history,  hypertension, depression, and history of renal disease, s/p arthroscopic knee surgery,   presenting for routine followup. History of obstructive sleep apnea, does not tolerate CPAP  For atrial flutter, we performed a cardioversion on 03/14/2013.  Since that time, he has had 2 admissions to the hospital for gastroenteritis/ campylobacter infection. Required a long course of antibiotics. After several weeks of treatment, now feels better. Thinks he caught the infection from tainted chicken.  Continues on amiodarone 200 mg twice a day, metoprolol tartrate 50 mg twice a day, paradaxa 150 mg twice a day.  Previous total cholesterol 171, LDL 56, triglycerides 392, HDL 37  cardiac workup in the 1990s:  cardiac catheterization with "minimal blockage". He does report chronic sweating at nighttime which he attributes to the Vicodin. Previous DVT , he did have shortness of breath.  echocardiogram March 2011 shows normal LV and RV systolic function, mild LVH, diastolic dysfunction, mildly dilated left atrium.  EKG shows atrial flutter with ventricular rate 66 beats per minute   Outpatient Encounter Prescriptions as of 04/25/2013  Medication Sig Dispense Refill  . amiodarone (PACERONE) 200 MG tablet Take 1 tablet (200 mg total) by mouth 2 (two) times daily.  60 tablet  4  . amitriptyline (ELAVIL) 25 MG tablet Take 25 mg by mouth at bedtime.      [name] aspirin 81 MG tablet Take 2 tablets (162 mg total) by mouth daily.      . budesonide (RHINOCORT AQUA) 32 MCG/ACT nasal spray Place 1 spray into the nose daily as needed.       . Canagliflozin (INVOKANA) 100 MG TABS Take by mouth daily.       . cyclobenzaprine (FLEXERIL) 10 MG tablet Take 10 mg by mouth 2 (two) times daily.       . dabigatran (PRADAXA) 150 MG CAPS Take 1 capsule (150 mg total) by mouth every 12 (twelve) hours.  60 capsule  11  . diazepam (VALIUM) 2 MG tablet Take 2 mg by mouth every 8 (eight) hours as needed.       . dutasteride (AVODART) 0.5 MG capsule Take 0.5 mg by mouth daily.        [name] esomeprazole (NEXIUM) 40 MG capsule Take 40 mg by mouth daily before breakfast.        . fexofenadine (ALLEGRA) 180 MG tablet Take 180 mg by mouth daily.        . furosemide (LASIX) 20 MG tablet Take two (20 mg) tablets twice a day.       . gabapentin (NEURONTIN) 800 MG tablet Take 800 mg by mouth 3 (three) times daily.       . hydrochlorothiazide (HYDRODIURIL) 25 MG tablet Take 25 mg by mouth daily.       . Insulin Glargine (LANTUS SOLOSTAR [place]) Inject 20 Units into the skin daily.       [name] LIRAGLUTIDE [place] Inject 1.2 mg into the skin.       . metoprolol (LOPRESSOR) 50 MG tablet Take 50 mg by mouth 2 (two) times daily.       . Multiple Vitamins-Minerals (CENTRUM SILVER ULTRA MENS PO) Take by mouth daily.      [name]  NON FORMULARY Depro shot one every 2 weeks      . Omega-3 Fatty Acids (FISH OIL) 1200 MG CAPS Take 1,200 mg by mouth 2 (two) times daily.      [name] oxyCODONE (OXYCONTIN) 40 MG 12 hr tablet Take 40 mg by mouth 3 (three) times daily.        . pravastatin (PRAVACHOL) 10 MG tablet Take 10 mg by mouth daily.      . ranitidine (ZANTAC) 300 MG tablet Take 300 mg by mouth at bedtime.        . sitaGLIPtan (JANUVIA) 100 MG tablet Take 100 mg by mouth daily.          Review of Systems  Constitutional: Negative.   HENT: Negative.   Eyes: Negative.   Gastrointestinal: Negative.   Musculoskeletal: Positive for arthralgias.  Skin: Negative.   Neurological: Negative.   Psychiatric/Behavioral: Negative.   All other systems reviewed and are negative.   BP 124/90  Pulse 66  Ht 5\' 11"  (1.803 m)  Wt 257 lb 12 oz (116.915 kg)  BMI  35.96 kg/m2  Physical Exam  Nursing note and vitals reviewed. Constitutional: He is oriented to person, place, and time. He appears well-developed and well-nourished.  Obese  HENT:  Head: Normocephalic.  Nose: Nose normal.  Mouth/Throat: Oropharynx is clear and moist.  Eyes: Conjunctivae are normal. Pupils are equal, round, and reactive to light.  Neck: Normal range of motion. Neck supple. No JVD present.  Cardiovascular: Normal rate, S1 normal, S2 normal, normal heart sounds and intact distal pulses.  An irregularly irregular rhythm present. Exam reveals no gallop and no friction rub.   No murmur heard. Pulmonary/Chest: Effort normal and breath sounds normal. No respiratory distress. He has no wheezes. He has no rales. He exhibits no tenderness.  Abdominal: Soft. Bowel sounds are normal. He exhibits no distension. There is no tenderness.  Musculoskeletal: Normal range of motion. He exhibits no edema and no tenderness.  Lymphadenopathy:    He has no cervical adenopathy.  Neurological: He is alert and oriented to person, place, and time. Coordination normal.  Skin: Skin is warm and dry. No rash noted. No erythema.  Psychiatric: He has a normal mood and affect. His behavior is normal. Judgment and thought content normal.      Assessment and Plan

## 2013-04-25 NOTE — Assessment & Plan Note (Signed)
Continue pravastatin. No recent lipid panel available

## 2013-04-25 NOTE — Assessment & Plan Note (Addendum)
He is holding in normal sinus rhythm. We'll decrease his amiodarone down to 200 mg daily, continue metoprolol twice a day. He will stop his anticoagulation and take this only as needed for recurrent atrial fibrillation.

## 2013-04-25 NOTE — Patient Instructions (Addendum)
Please monitor your heart rate  Take aspirin 81 mg daily Hold the pradaxa Decrease the amiodarone to 200 mg once a day  Please call us if you have new issues that need to be addressed before your next appt.  Your physician wants you to follow-up in: 6 months.  You will receive a reminder letter in the mail two months in advance. If you don't receive a letter, please call our office to schedule the follow-up appointment.

## 2013-05-14 ENCOUNTER — Ambulatory Visit: Payer: Self-pay | Admitting: Family Medicine

## 2013-05-14 DIAGNOSIS — M779 Enthesopathy, unspecified: Secondary | ICD-10-CM | POA: Diagnosis not present

## 2013-05-14 DIAGNOSIS — M25539 Pain in unspecified wrist: Secondary | ICD-10-CM | POA: Diagnosis not present

## 2013-05-14 DIAGNOSIS — M129 Arthropathy, unspecified: Secondary | ICD-10-CM | POA: Diagnosis not present

## 2013-05-14 DIAGNOSIS — M549 Dorsalgia, unspecified: Secondary | ICD-10-CM | POA: Diagnosis not present

## 2013-05-14 DIAGNOSIS — E119 Type 2 diabetes mellitus without complications: Secondary | ICD-10-CM | POA: Diagnosis not present

## 2013-05-15 DIAGNOSIS — E291 Testicular hypofunction: Secondary | ICD-10-CM | POA: Diagnosis not present

## 2013-05-22 DIAGNOSIS — M47817 Spondylosis without myelopathy or radiculopathy, lumbosacral region: Secondary | ICD-10-CM | POA: Diagnosis not present

## 2013-05-23 ENCOUNTER — Other Ambulatory Visit: Payer: Self-pay | Admitting: Neurosurgery

## 2013-05-23 DIAGNOSIS — M47817 Spondylosis without myelopathy or radiculopathy, lumbosacral region: Secondary | ICD-10-CM

## 2013-05-27 ENCOUNTER — Ambulatory Visit
Admission: RE | Admit: 2013-05-27 | Discharge: 2013-05-27 | Disposition: A | Discharge status: Home / Self Care | Payer: Medicare Other | Source: Ambulatory Visit | Attending: Neurosurgery | Admitting: Neurosurgery

## 2013-05-27 DIAGNOSIS — M47817 Spondylosis without myelopathy or radiculopathy, lumbosacral region: Secondary | ICD-10-CM

## 2013-05-27 DIAGNOSIS — M48061 Spinal stenosis, lumbar region without neurogenic claudication: Secondary | ICD-10-CM | POA: Diagnosis not present

## 2013-05-27 DIAGNOSIS — M5126 Other intervertebral disc displacement, lumbar region: Secondary | ICD-10-CM | POA: Diagnosis not present

## 2013-06-06 DIAGNOSIS — E291 Testicular hypofunction: Secondary | ICD-10-CM | POA: Diagnosis not present

## 2013-06-11 DIAGNOSIS — M25539 Pain in unspecified wrist: Secondary | ICD-10-CM | POA: Diagnosis not present

## 2013-06-11 DIAGNOSIS — M65839 Other synovitis and tenosynovitis, unspecified forearm: Secondary | ICD-10-CM | POA: Diagnosis not present

## 2013-06-11 DIAGNOSIS — M549 Dorsalgia, unspecified: Secondary | ICD-10-CM | POA: Diagnosis not present

## 2013-06-11 DIAGNOSIS — M779 Enthesopathy, unspecified: Secondary | ICD-10-CM | POA: Diagnosis not present

## 2013-06-11 DIAGNOSIS — M65849 Other synovitis and tenosynovitis, unspecified hand: Secondary | ICD-10-CM | POA: Diagnosis not present

## 2013-06-19 ENCOUNTER — Other Ambulatory Visit: Payer: Self-pay | Admitting: Cardiovascular Disease

## 2013-06-19 NOTE — Telephone Encounter (Signed)
Refilled Hydrochlorothiazide sent to [facility].

## 2013-06-20 DIAGNOSIS — E291 Testicular hypofunction: Secondary | ICD-10-CM | POA: Diagnosis not present

## 2013-06-21 DIAGNOSIS — M25539 Pain in unspecified wrist: Secondary | ICD-10-CM | POA: Diagnosis not present

## 2013-07-06 DIAGNOSIS — E291 Testicular hypofunction: Secondary | ICD-10-CM | POA: Diagnosis not present

## 2013-07-06 DIAGNOSIS — N529 Male erectile dysfunction, unspecified: Secondary | ICD-10-CM | POA: Diagnosis not present

## 2013-07-11 DIAGNOSIS — M47817 Spondylosis without myelopathy or radiculopathy, lumbosacral region: Secondary | ICD-10-CM | POA: Diagnosis not present

## 2013-07-17 DIAGNOSIS — H179 Unspecified corneal scar and opacity: Secondary | ICD-10-CM | POA: Diagnosis not present

## 2013-07-24 DIAGNOSIS — E291 Testicular hypofunction: Secondary | ICD-10-CM | POA: Diagnosis not present

## 2013-08-04 ENCOUNTER — Other Ambulatory Visit: Payer: Self-pay | Admitting: Cardiovascular Disease

## 2013-08-06 ENCOUNTER — Other Ambulatory Visit: Payer: Self-pay | Admitting: *Deleted

## 2013-08-06 MED ORDER — METOPROLOL TARTRATE 50 MG PO TABS: 50.0000 mg | ORAL_TABLET | Freq: Two times a day (BID) | ORAL | Status: DC

## 2013-08-06 NOTE — Telephone Encounter (Signed)
Refilled Metoprolol sent to [facility]. 

## 2013-08-09 DIAGNOSIS — E291 Testicular hypofunction: Secondary | ICD-10-CM | POA: Diagnosis not present

## 2013-08-23 DIAGNOSIS — N529 Male erectile dysfunction, unspecified: Secondary | ICD-10-CM | POA: Diagnosis not present

## 2013-08-23 DIAGNOSIS — E291 Testicular hypofunction: Secondary | ICD-10-CM | POA: Diagnosis not present

## 2013-09-18 ENCOUNTER — Ambulatory Visit: Payer: Self-pay | Admitting: Family Medicine

## 2013-09-18 DIAGNOSIS — E118 Type 2 diabetes mellitus with unspecified complications: Secondary | ICD-10-CM | POA: Diagnosis not present

## 2013-09-18 DIAGNOSIS — IMO0002 Reserved for concepts with insufficient information to code with codable children: Secondary | ICD-10-CM | POA: Diagnosis not present

## 2013-09-18 DIAGNOSIS — R079 Chest pain, unspecified: Secondary | ICD-10-CM | POA: Diagnosis not present

## 2013-09-18 DIAGNOSIS — R109 Unspecified abdominal pain: Secondary | ICD-10-CM | POA: Diagnosis not present

## 2013-09-18 DIAGNOSIS — R5381 Other malaise: Secondary | ICD-10-CM | POA: Diagnosis not present

## 2013-09-18 DIAGNOSIS — R5383 Other fatigue: Secondary | ICD-10-CM | POA: Diagnosis not present

## 2013-09-18 DIAGNOSIS — Z981 Arthrodesis status: Secondary | ICD-10-CM | POA: Diagnosis not present

## 2013-09-18 DIAGNOSIS — E119 Type 2 diabetes mellitus without complications: Secondary | ICD-10-CM | POA: Diagnosis not present

## 2013-09-19 DIAGNOSIS — E291 Testicular hypofunction: Secondary | ICD-10-CM | POA: Diagnosis not present

## 2013-09-19 DIAGNOSIS — N529 Male erectile dysfunction, unspecified: Secondary | ICD-10-CM | POA: Diagnosis not present

## 2013-09-20 ENCOUNTER — Ambulatory Visit: Payer: Self-pay | Admitting: Family Medicine

## 2013-09-20 DIAGNOSIS — R109 Unspecified abdominal pain: Secondary | ICD-10-CM | POA: Diagnosis not present

## 2013-09-24 DIAGNOSIS — Z23 Encounter for immunization: Secondary | ICD-10-CM | POA: Diagnosis not present

## 2013-09-24 DIAGNOSIS — R5381 Other malaise: Secondary | ICD-10-CM | POA: Diagnosis not present

## 2013-09-24 DIAGNOSIS — R079 Chest pain, unspecified: Secondary | ICD-10-CM | POA: Diagnosis not present

## 2013-09-24 DIAGNOSIS — R109 Unspecified abdominal pain: Secondary | ICD-10-CM | POA: Diagnosis not present

## 2013-09-24 DIAGNOSIS — R5383 Other fatigue: Secondary | ICD-10-CM | POA: Diagnosis not present

## 2013-10-09 DIAGNOSIS — E291 Testicular hypofunction: Secondary | ICD-10-CM | POA: Diagnosis not present

## 2013-10-18 ENCOUNTER — Ambulatory Visit: Payer: Self-pay | Admitting: Family Medicine

## 2013-10-18 DIAGNOSIS — Z23 Encounter for immunization: Secondary | ICD-10-CM | POA: Diagnosis not present

## 2013-10-18 DIAGNOSIS — R079 Chest pain, unspecified: Secondary | ICD-10-CM | POA: Diagnosis not present

## 2013-10-18 DIAGNOSIS — M47817 Spondylosis without myelopathy or radiculopathy, lumbosacral region: Secondary | ICD-10-CM | POA: Diagnosis not present

## 2013-10-18 DIAGNOSIS — R109 Unspecified abdominal pain: Secondary | ICD-10-CM | POA: Diagnosis not present

## 2013-10-26 ENCOUNTER — Encounter: Payer: Self-pay | Admitting: Cardiovascular Disease

## 2013-10-26 ENCOUNTER — Ambulatory Visit (INDEPENDENT_AMBULATORY_CARE_PROVIDER_SITE_OTHER): Payer: Medicare Other | Admitting: Cardiovascular Disease

## 2013-10-26 VITALS — BP 132/76 | HR 79 | Ht 71.0 in | Wt 248.8 lb

## 2013-10-26 DIAGNOSIS — E119 Type 2 diabetes mellitus without complications: Secondary | ICD-10-CM

## 2013-10-26 DIAGNOSIS — I10 Essential (primary) hypertension: Secondary | ICD-10-CM | POA: Diagnosis not present

## 2013-10-26 DIAGNOSIS — I4892 Unspecified atrial flutter: Secondary | ICD-10-CM

## 2013-10-26 DIAGNOSIS — E785 Hyperlipidemia, unspecified: Secondary | ICD-10-CM

## 2013-10-26 NOTE — Assessment & Plan Note (Signed)
Cholesterol is at goal on the current lipid regimen. No changes to the medications were made.  

## 2013-10-26 NOTE — Progress Notes (Signed)
Patient ID: Jerry Parsons, male    DOB: September 19, 1951, 62 y.o.   MRN: [id number]  HPI Comments: Jerry Parsons is a very pleasant 62 year old gentleman, patient of Dr. Sullivan Lone, with a history of multiple back surgeries, spinal stenosis , obesity, diabetes , DVT, PE in March 2011 previously on warfarin, 30 year smoking history,  hypertension, depression, and history of renal disease, s/p arthroscopic knee surgery,   presenting for routine followup. History of obstructive sleep apnea, does not tolerate CPAP.[name] chronic sweating at nighttime which he attributes to the Vicodin.  For atrial flutter, we performed a cardioversion on 03/14/2013.  Since that time, he has had 2 admissions to the hospital for gastroenteritis/ campylobacter infection. Required a long course of antibiotics. Thinks he caught the infection from tainted chicken.  He reports significant weight loss through his GI infection  Continues on amiodarone 200 mg daily, metoprolol tartrate 50 mg twice a day (no longer taking anticoagulation) In general he feels well with no complaints apart from his chronic back pain Also having problems with constipation  Total cholesterol 139, LDL 48, Hemoglobin A1c in February 2014 was 11  cardiac workup in the 1990s:  cardiac catheterization with "minimal blockage".  Previous DVT , he did have shortness of breath.  echocardiogram March 2011 shows normal LV and RV systolic function, mild LVH, diastolic dysfunction, mildly dilated left atrium.  EKG shows normal sinus rhythm with rate 80 beats per minute, no significant ST or T wave changes   Outpatient Encounter Prescriptions as of 10/26/2013  Medication Sig  . amiodarone (PACERONE) 200 MG tablet Take 200 mg by mouth daily.  [name] amitriptyline (ELAVIL) 25 MG tablet Take 25 mg by mouth at bedtime.  [name] aspirin 81 MG tablet Take 81 mg by mouth daily.   . budesonide (RHINOCORT AQUA) 32 MCG/ACT nasal spray Place 1 spray into the nose daily as needed.   .  Canagliflozin (INVOKANA) 100 MG TABS Take by mouth daily.  . cyclobenzaprine (FLEXERIL) 10 MG tablet Take 10 mg by mouth 2 (two) times daily.   . diazepam (VALIUM) 2 MG tablet Take 2 mg by mouth every 8 (eight) hours as needed.   . dutasteride (AVODART) 0.5 MG capsule Take 0.5 mg by mouth daily.    [name] esomeprazole (NEXIUM) 40 MG capsule Take 40 mg by mouth 2 (two) times daily.   . fexofenadine (ALLEGRA) 180 MG tablet Take 180 mg by mouth daily.    . furosemide (LASIX) 20 MG tablet Take two (20 mg) tablets twice a day.   . gabapentin (NEURONTIN) 800 MG tablet Take 800 mg by mouth 3 (three) times daily.   . hydrochlorothiazide (HYDRODIURIL) 25 MG tablet Take 1/2 tablet  daily.  . Insulin Glargine (LANTUS SOLOSTAR [place]) Inject 20 Units into the skin daily.   [name] LIRAGLUTIDE [place] Inject 1.2 mg into the skin.   . metFORMIN (GLUCOPHAGE) 1000 MG tablet Take 1,000 mg by mouth 2 (two) times daily with a meal.   . metoprolol (LOPRESSOR) 50 MG tablet Take 1 tablet (50 mg total) by mouth 2 (two) times daily.  . Multiple Vitamins-Minerals (CENTRUM SILVER ULTRA MENS PO) Take by mouth daily.  . NON FORMULARY Depro shot one every 2 weeks  . Omega-3 Fatty Acids (FISH OIL) 1200 MG CAPS Take 1,200 mg by mouth 2 (two) times daily.  [name] oxyCODONE (OXYCONTIN) 40 MG 12 hr tablet Take 40 mg by mouth 3 (three) times daily.    . pravastatin (PRAVACHOL) 10 MG tablet Take  10 mg by mouth daily.  . ranitidine (ZANTAC) 300 MG tablet Take 300 mg by mouth at bedtime.    . sitaGLIPtan (JANUVIA) 100 MG tablet Take 100 mg by mouth daily.    . [DISCONTINUED] amiodarone (PACERONE) 200 MG tablet Take 1 tablet (200 mg total) by mouth 2 (two) times daily.  . [DISCONTINUED] hydrochlorothiazide (HYDRODIURIL) 25 MG tablet TAKE 1 TABLET BY MOUTH EVERY DAY  . testosterone cypionate (DEPOTESTOTERONE CYPIONATE) 200 MG/ML injection    Review of Systems  Constitutional: Negative.   HENT: Negative.   Eyes: Negative.   Respiratory: Negative.    Cardiovascular: Negative.   Gastrointestinal: Negative.   Endocrine: Negative.   Musculoskeletal: Positive for arthralgias and back pain.  Skin: Negative.   Allergic/Immunologic: Negative.   Neurological: Negative.   Hematological: Negative.   Psychiatric/Behavioral: Negative.   All other systems reviewed and are negative.   BP 132/76  Pulse 79  Ht 5\' 11"  (1.803 m)  Wt 248 lb 12 oz (112.832 kg)  BMI 34.71 kg/m2  Physical Exam  Nursing note and vitals reviewed. Constitutional: He is oriented to person, place, and time. He appears well-developed and well-nourished.  Obese  HENT:  Head: Normocephalic.  Nose: Nose normal.  Mouth/Throat: Oropharynx is clear and moist.  Eyes: Conjunctivae are normal. Pupils are equal, round, and reactive to light.  Neck: Normal range of motion. Neck supple. No JVD present.  Cardiovascular: Normal rate, S1 normal, S2 normal, normal heart sounds and intact distal pulses.  An irregularly irregular rhythm present. Exam reveals no gallop and no friction rub.   No murmur heard. Pulmonary/Chest: Effort normal and breath sounds normal. No respiratory distress. He has no wheezes. He has no rales. He exhibits no tenderness.  Abdominal: Soft. Bowel sounds are normal. He exhibits no distension. There is no tenderness.  Musculoskeletal: Normal range of motion. He exhibits no edema and no tenderness.  Lymphadenopathy:    He has no cervical adenopathy.  Neurological: He is alert and oriented to person, place, and time. Coordination normal.  Skin: Skin is warm and dry. No rash noted. No erythema.  Psychiatric: He has a normal mood and affect. His behavior is normal. Judgment and thought content normal.      Assessment and Plan

## 2013-10-26 NOTE — Assessment & Plan Note (Signed)
No recent episodes of atrial fibrillation or flutter. We'll continue low-dose amiodarone, metoprolol.

## 2013-10-26 NOTE — Assessment & Plan Note (Signed)
Blood pressure is well controlled on today's visit. No changes made to the medications. 

## 2013-10-26 NOTE — Patient Instructions (Signed)
You are doing well. No medication changes were made.  Please call us if you have new issues that need to be addressed before your next appt.  Your physician wants you to follow-up in: 6 months.  You will receive a reminder letter in the mail two months in advance. If you don't receive a letter, please call our office to schedule the follow-up appointment.   

## 2013-10-26 NOTE — Assessment & Plan Note (Signed)
We have encouraged continued exercise, careful diet management in an effort to lose weight. 

## 2013-11-01 ENCOUNTER — Other Ambulatory Visit: Payer: Self-pay

## 2013-11-05 DIAGNOSIS — R12 Heartburn: Secondary | ICD-10-CM | POA: Diagnosis not present

## 2013-11-05 DIAGNOSIS — Z1211 Encounter for screening for malignant neoplasm of colon: Secondary | ICD-10-CM | POA: Diagnosis not present

## 2013-11-05 DIAGNOSIS — R1310 Dysphagia, unspecified: Secondary | ICD-10-CM | POA: Diagnosis not present

## 2013-11-06 DIAGNOSIS — E291 Testicular hypofunction: Secondary | ICD-10-CM | POA: Diagnosis not present

## 2013-11-20 DIAGNOSIS — E291 Testicular hypofunction: Secondary | ICD-10-CM | POA: Diagnosis not present

## 2013-12-03 ENCOUNTER — Ambulatory Visit: Payer: Self-pay | Admitting: Unknown Physician Specialty

## 2013-12-03 DIAGNOSIS — K319 Disease of stomach and duodenum, unspecified: Secondary | ICD-10-CM | POA: Diagnosis not present

## 2013-12-03 DIAGNOSIS — I10 Essential (primary) hypertension: Secondary | ICD-10-CM | POA: Diagnosis not present

## 2013-12-03 DIAGNOSIS — K2970 Gastritis, unspecified, without bleeding: Secondary | ICD-10-CM | POA: Diagnosis not present

## 2013-12-03 DIAGNOSIS — R12 Heartburn: Secondary | ICD-10-CM | POA: Diagnosis not present

## 2013-12-03 DIAGNOSIS — E119 Type 2 diabetes mellitus without complications: Secondary | ICD-10-CM | POA: Diagnosis not present

## 2013-12-03 DIAGNOSIS — Z79899 Other long term (current) drug therapy: Secondary | ICD-10-CM | POA: Diagnosis not present

## 2013-12-03 DIAGNOSIS — R1310 Dysphagia, unspecified: Secondary | ICD-10-CM | POA: Diagnosis not present

## 2013-12-03 DIAGNOSIS — Z1211 Encounter for screening for malignant neoplasm of colon: Secondary | ICD-10-CM | POA: Diagnosis not present

## 2013-12-03 DIAGNOSIS — K620 Anal polyp: Secondary | ICD-10-CM | POA: Diagnosis not present

## 2013-12-03 DIAGNOSIS — K269 Duodenal ulcer, unspecified as acute or chronic, without hemorrhage or perforation: Secondary | ICD-10-CM | POA: Diagnosis not present

## 2013-12-03 DIAGNOSIS — K219 Gastro-esophageal reflux disease without esophagitis: Secondary | ICD-10-CM | POA: Diagnosis not present

## 2013-12-03 DIAGNOSIS — K648 Other hemorrhoids: Secondary | ICD-10-CM | POA: Diagnosis not present

## 2013-12-03 DIAGNOSIS — Z7982 Long term (current) use of aspirin: Secondary | ICD-10-CM | POA: Diagnosis not present

## 2013-12-03 DIAGNOSIS — Z794 Long term (current) use of insulin: Secondary | ICD-10-CM | POA: Diagnosis not present

## 2013-12-03 DIAGNOSIS — E785 Hyperlipidemia, unspecified: Secondary | ICD-10-CM | POA: Diagnosis not present

## 2013-12-03 DIAGNOSIS — K2940 Chronic atrophic gastritis without bleeding: Secondary | ICD-10-CM | POA: Diagnosis not present

## 2013-12-03 DIAGNOSIS — D126 Benign neoplasm of colon, unspecified: Secondary | ICD-10-CM | POA: Diagnosis not present

## 2013-12-03 DIAGNOSIS — I4891 Unspecified atrial fibrillation: Secondary | ICD-10-CM | POA: Diagnosis not present

## 2013-12-03 DIAGNOSIS — G4730 Sleep apnea, unspecified: Secondary | ICD-10-CM | POA: Diagnosis not present

## 2013-12-03 DIAGNOSIS — G609 Hereditary and idiopathic neuropathy, unspecified: Secondary | ICD-10-CM | POA: Diagnosis not present

## 2013-12-03 DIAGNOSIS — D128 Benign neoplasm of rectum: Secondary | ICD-10-CM | POA: Diagnosis not present

## 2013-12-03 LAB — HM COLONOSCOPY

## 2013-12-04 LAB — PATHOLOGY REPORT

## 2013-12-08 ENCOUNTER — Other Ambulatory Visit: Payer: Self-pay | Admitting: Cardiovascular Disease

## 2013-12-26 DIAGNOSIS — E291 Testicular hypofunction: Secondary | ICD-10-CM | POA: Diagnosis not present

## 2014-01-01 DIAGNOSIS — J42 Unspecified chronic bronchitis: Secondary | ICD-10-CM | POA: Diagnosis not present

## 2014-01-01 DIAGNOSIS — M546 Pain in thoracic spine: Secondary | ICD-10-CM | POA: Diagnosis not present

## 2014-01-01 DIAGNOSIS — E119 Type 2 diabetes mellitus without complications: Secondary | ICD-10-CM | POA: Diagnosis not present

## 2014-01-01 DIAGNOSIS — I10 Essential (primary) hypertension: Secondary | ICD-10-CM | POA: Diagnosis not present

## 2014-01-16 DIAGNOSIS — N138 Other obstructive and reflux uropathy: Secondary | ICD-10-CM | POA: Diagnosis not present

## 2014-01-16 DIAGNOSIS — E291 Testicular hypofunction: Secondary | ICD-10-CM | POA: Diagnosis not present

## 2014-01-16 DIAGNOSIS — N401 Enlarged prostate with lower urinary tract symptoms: Secondary | ICD-10-CM | POA: Diagnosis not present

## 2014-01-17 DIAGNOSIS — M47817 Spondylosis without myelopathy or radiculopathy, lumbosacral region: Secondary | ICD-10-CM | POA: Diagnosis not present

## 2014-02-12 DIAGNOSIS — E291 Testicular hypofunction: Secondary | ICD-10-CM | POA: Diagnosis not present

## 2014-02-20 ENCOUNTER — Ambulatory Visit: Payer: Self-pay | Admitting: Family Medicine

## 2014-02-20 DIAGNOSIS — E119 Type 2 diabetes mellitus without complications: Secondary | ICD-10-CM | POA: Diagnosis not present

## 2014-02-20 DIAGNOSIS — R05 Cough: Secondary | ICD-10-CM | POA: Diagnosis not present

## 2014-02-20 DIAGNOSIS — R079 Chest pain, unspecified: Secondary | ICD-10-CM | POA: Diagnosis not present

## 2014-02-20 DIAGNOSIS — R059 Cough, unspecified: Secondary | ICD-10-CM | POA: Diagnosis not present

## 2014-02-20 DIAGNOSIS — M549 Dorsalgia, unspecified: Secondary | ICD-10-CM | POA: Diagnosis not present

## 2014-02-20 DIAGNOSIS — J189 Pneumonia, unspecified organism: Secondary | ICD-10-CM | POA: Diagnosis not present

## 2014-02-25 DIAGNOSIS — R059 Cough, unspecified: Secondary | ICD-10-CM | POA: Diagnosis not present

## 2014-02-25 DIAGNOSIS — E119 Type 2 diabetes mellitus without complications: Secondary | ICD-10-CM | POA: Diagnosis not present

## 2014-02-25 DIAGNOSIS — M549 Dorsalgia, unspecified: Secondary | ICD-10-CM | POA: Diagnosis not present

## 2014-02-25 DIAGNOSIS — R05 Cough: Secondary | ICD-10-CM | POA: Diagnosis not present

## 2014-02-25 DIAGNOSIS — J411 Mucopurulent chronic bronchitis: Secondary | ICD-10-CM | POA: Diagnosis not present

## 2014-03-20 DIAGNOSIS — E291 Testicular hypofunction: Secondary | ICD-10-CM | POA: Diagnosis not present

## 2014-04-07 ENCOUNTER — Other Ambulatory Visit: Payer: Self-pay | Admitting: Cardiovascular Disease

## 2014-04-10 DIAGNOSIS — E291 Testicular hypofunction: Secondary | ICD-10-CM | POA: Diagnosis not present

## 2014-04-15 ENCOUNTER — Ambulatory Visit: Payer: Self-pay | Admitting: Family Medicine

## 2014-04-15 DIAGNOSIS — M549 Dorsalgia, unspecified: Secondary | ICD-10-CM | POA: Diagnosis not present

## 2014-04-15 DIAGNOSIS — IMO0002 Reserved for concepts with insufficient information to code with codable children: Secondary | ICD-10-CM | POA: Diagnosis not present

## 2014-04-15 DIAGNOSIS — E1129 Type 2 diabetes mellitus with other diabetic kidney complication: Secondary | ICD-10-CM | POA: Diagnosis not present

## 2014-04-15 DIAGNOSIS — R05 Cough: Secondary | ICD-10-CM | POA: Diagnosis not present

## 2014-04-15 DIAGNOSIS — R059 Cough, unspecified: Secondary | ICD-10-CM | POA: Diagnosis not present

## 2014-04-15 DIAGNOSIS — R0902 Hypoxemia: Secondary | ICD-10-CM | POA: Diagnosis not present

## 2014-04-16 DIAGNOSIS — R0902 Hypoxemia: Secondary | ICD-10-CM | POA: Diagnosis not present

## 2014-04-16 DIAGNOSIS — R609 Edema, unspecified: Secondary | ICD-10-CM | POA: Diagnosis not present

## 2014-04-17 DIAGNOSIS — R0902 Hypoxemia: Secondary | ICD-10-CM | POA: Diagnosis not present

## 2014-04-17 DIAGNOSIS — E118 Type 2 diabetes mellitus with unspecified complications: Secondary | ICD-10-CM | POA: Diagnosis not present

## 2014-04-17 DIAGNOSIS — IMO0002 Reserved for concepts with insufficient information to code with codable children: Secondary | ICD-10-CM | POA: Diagnosis not present

## 2014-04-17 DIAGNOSIS — R5381 Other malaise: Secondary | ICD-10-CM | POA: Diagnosis not present

## 2014-04-17 DIAGNOSIS — R5383 Other fatigue: Secondary | ICD-10-CM | POA: Diagnosis not present

## 2014-04-17 DIAGNOSIS — E119 Type 2 diabetes mellitus without complications: Secondary | ICD-10-CM | POA: Diagnosis not present

## 2014-04-18 DIAGNOSIS — M79609 Pain in unspecified limb: Secondary | ICD-10-CM | POA: Diagnosis not present

## 2014-04-18 DIAGNOSIS — M7989 Other specified soft tissue disorders: Secondary | ICD-10-CM | POA: Diagnosis not present

## 2014-04-25 DIAGNOSIS — J45909 Unspecified asthma, uncomplicated: Secondary | ICD-10-CM | POA: Diagnosis not present

## 2014-04-25 DIAGNOSIS — R0902 Hypoxemia: Secondary | ICD-10-CM | POA: Diagnosis not present

## 2014-04-25 DIAGNOSIS — J449 Chronic obstructive pulmonary disease, unspecified: Secondary | ICD-10-CM | POA: Diagnosis not present

## 2014-04-25 DIAGNOSIS — I509 Heart failure, unspecified: Secondary | ICD-10-CM | POA: Diagnosis not present

## 2014-05-01 DIAGNOSIS — E291 Testicular hypofunction: Secondary | ICD-10-CM | POA: Diagnosis not present

## 2014-05-02 DIAGNOSIS — M47817 Spondylosis without myelopathy or radiculopathy, lumbosacral region: Secondary | ICD-10-CM | POA: Diagnosis not present

## 2014-05-05 ENCOUNTER — Other Ambulatory Visit: Payer: Self-pay | Admitting: Cardiovascular Disease

## 2014-05-06 DIAGNOSIS — M549 Dorsalgia, unspecified: Secondary | ICD-10-CM | POA: Diagnosis not present

## 2014-05-06 DIAGNOSIS — E669 Obesity, unspecified: Secondary | ICD-10-CM | POA: Diagnosis not present

## 2014-05-06 DIAGNOSIS — I509 Heart failure, unspecified: Secondary | ICD-10-CM | POA: Diagnosis not present

## 2014-05-06 DIAGNOSIS — E1129 Type 2 diabetes mellitus with other diabetic kidney complication: Secondary | ICD-10-CM | POA: Diagnosis not present

## 2014-05-07 ENCOUNTER — Encounter: Payer: Self-pay | Admitting: Cardiovascular Disease

## 2014-05-07 ENCOUNTER — Ambulatory Visit (INDEPENDENT_AMBULATORY_CARE_PROVIDER_SITE_OTHER): Payer: Medicare Other | Admitting: Cardiovascular Disease

## 2014-05-07 VITALS — BP 110/78 | HR 82 | Ht 71.0 in | Wt 240.0 lb

## 2014-05-07 DIAGNOSIS — E669 Obesity, unspecified: Secondary | ICD-10-CM | POA: Diagnosis not present

## 2014-05-07 DIAGNOSIS — E119 Type 2 diabetes mellitus without complications: Secondary | ICD-10-CM

## 2014-05-07 DIAGNOSIS — I5032 Chronic diastolic (congestive) heart failure: Secondary | ICD-10-CM

## 2014-05-07 DIAGNOSIS — I4892 Unspecified atrial flutter: Secondary | ICD-10-CM

## 2014-05-07 DIAGNOSIS — I509 Heart failure, unspecified: Secondary | ICD-10-CM

## 2014-05-07 DIAGNOSIS — R609 Edema, unspecified: Secondary | ICD-10-CM

## 2014-05-07 DIAGNOSIS — E785 Hyperlipidemia, unspecified: Secondary | ICD-10-CM

## 2014-05-07 DIAGNOSIS — I10 Essential (primary) hypertension: Secondary | ICD-10-CM | POA: Diagnosis not present

## 2014-05-07 DIAGNOSIS — R600 Localized edema: Secondary | ICD-10-CM

## 2014-05-07 NOTE — Progress Notes (Signed)
Patient ID: Jerry Parsons, male    DOB: May 29, 1951, 63 y.o.   MRN: [id number]  HPI Comments: Jerry Parsons is a very pleasant 63 year old gentleman, patient of Dr. [name], with a history of multiple back surgeries, spinal stenosis , obesity, poorly controlled diabetes , DVT, PE in March 2011 previously on warfarin, 30 year smoking history,  hypertension, depression, and history of renal disease, s/p arthroscopic knee surgery,   presenting for routine followup. History of obstructive sleep apnea, does not tolerate CPAP, hronic sweating at nighttime which he attributes to the Vicodin. He presents for routine followup  History of atrial flutter, status post cardioversion on 03/14/2013.   2 admissions to the hospital for gastroenteritis/ campylobacter infection. Required a long course of antibiotics. Possible  infection from tainted chicken.   Continues on amiodarone 200 mg daily, metoprolol tartrate 50 mg twice a day (no longer taking anticoagulation) He continues to have chronic back pain. He is sedentary. 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  Recently on prednisone for his "legs". Sugars ran high. Recently developed leg swelling. Wife went away and he sat in a recliner with his feet down for close to 2 weeks watching TV, drinking fluids.  Dr. [name] recommended he increase his Lasix. Now back on 40 mg daily down from 80 mg. Creatinine at the end of April was 1.5, BUN 27 Edema has significantly improved. Metformin recently held  Hemoglobin A1c in February 2014 was 11. Most recent hemoglobin A1c 9.9  cardiac workup in the 1990s:  cardiac catheterization with "minimal blockage".  Previous DVT , he did have shortness of breath.  echocardiogram March 2011 shows normal LV and RV systolic function, mild LVH, diastolic dysfunction, mildly dilated left atrium.  EKG shows normal sinus rhythm with rate 82 beats per minute, no significant ST or T wave changes   Outpatient Encounter Prescriptions as of  10/26/2013  Medication Sig  . amiodarone (PACERONE) 200 MG tablet Take 200 mg by mouth daily.  [name] amitriptyline (ELAVIL) 25 MG tablet Take 25 mg by mouth at bedtime.  [name] aspirin 81 MG tablet Take 81 mg by mouth daily.   . budesonide (RHINOCORT AQUA) 32 MCG/ACT nasal spray Place 1 spray into the nose daily as needed.   . Canagliflozin (INVOKANA) 100 MG TABS Take by mouth daily.  . cyclobenzaprine (FLEXERIL) 10 MG tablet Take 10 mg by mouth 2 (two) times daily.   . diazepam (VALIUM) 2 MG tablet Take 2 mg by mouth every 8 (eight) hours as needed.   . dutasteride (AVODART) 0.5 MG capsule Take 0.5 mg by mouth daily.    [name] esomeprazole (NEXIUM) 40 MG capsule Take 40 mg by mouth 2 (two) times daily.   . fexofenadine (ALLEGRA) 180 MG tablet Take 180 mg by mouth daily.    . furosemide (LASIX) 20 MG tablet Take two (20 mg) tablets twice a day.   . gabapentin (NEURONTIN) 800 MG tablet Take 800 mg by mouth 3 (three) times daily.   . hydrochlorothiazide (HYDRODIURIL) 25 MG tablet Take 1/2 tablet  daily.  . Insulin Glargine (LANTUS SOLOSTAR [place]) Inject 20 Units into the skin daily.   [name] LIRAGLUTIDE [place] Inject 1.2 mg into the skin.   . metFORMIN (GLUCOPHAGE) 1000 MG tablet Take 1,000 mg by mouth 2 (two) times daily with a meal.   . metoprolol (LOPRESSOR) 50 MG tablet Take 1 tablet (50 mg total) by mouth 2 (two) times daily.  . Multiple Vitamins-Minerals (CENTRUM SILVER ULTRA MENS PO) Take by  mouth daily.  . NON FORMULARY Depro shot one every 2 weeks  . Omega-3 Fatty Acids (FISH OIL) 1200 MG CAPS Take 1,200 mg by mouth 2 (two) times daily.  [name] oxyCODONE (OXYCONTIN) 40 MG 12 hr tablet Take 40 mg by mouth 3 (three) times daily.    . pravastatin (PRAVACHOL) 10 MG tablet Take 10 mg by mouth daily.  . ranitidine (ZANTAC) 300 MG tablet Take 300 mg by mouth at bedtime.    . sitaGLIPtan (JANUVIA) 100 MG tablet Take 100 mg by mouth daily.     Review of Systems  Constitutional: Negative.   HENT: Negative.   Eyes:  Negative.   Respiratory: Negative.   Cardiovascular: Negative.   Gastrointestinal: Negative.   Endocrine: Negative.   Musculoskeletal: Positive for arthralgias and back pain.  Skin: Negative.   Allergic/Immunologic: Negative.   Neurological: Negative.   Hematological: Negative.   Psychiatric/Behavioral: Negative.   All other systems reviewed and are negative.  BP 110/78  Pulse 82  Ht 5\' 11"  (1.803 m)  Wt 240 lb (108.863 kg)  BMI 33.49 kg/m2  Physical Exam  Nursing note and vitals reviewed. Constitutional: He is oriented to person, place, and time. He appears well-developed and well-nourished.  Obese  HENT:  Head: Normocephalic.  Nose: Nose normal.  Mouth/Throat: Oropharynx is clear and moist.  Eyes: Conjunctivae are normal. Pupils are equal, round, and reactive to light.  Neck: Normal range of motion. Neck supple. No JVD present.  Cardiovascular: Normal rate, S1 normal, S2 normal, normal heart sounds and intact distal pulses.  An irregularly irregular rhythm present. Exam reveals no gallop and no friction rub.   No murmur heard. Pulmonary/Chest: Effort normal and breath sounds normal. No respiratory distress. He has no wheezes. He has no rales. He exhibits no tenderness.  Abdominal: Soft. Bowel sounds are normal. He exhibits no distension. There is no tenderness.  Musculoskeletal: Normal range of motion. He exhibits no edema and no tenderness.  Lymphadenopathy:    He has no cervical adenopathy.  Neurological: He is alert and oriented to person, place, and time. Coordination normal.  Skin: Skin is warm and dry. No rash noted. No erythema.  Psychiatric: He has a normal mood and affect. His behavior is normal. Judgment and thought content normal.      Assessment and Plan

## 2014-05-07 NOTE — Assessment & Plan Note (Signed)
Maintaining normal sinus rhythm 

## 2014-05-07 NOTE — Assessment & Plan Note (Addendum)
Recommended he stay on Lasix 40 mg daily. Extra Lasix for weight gain or worsening leg edema.  If creatinine continues to climb, may need to cut back on a diuretic to avoid dehydration

## 2014-05-07 NOTE — Patient Instructions (Signed)
You are doing well. No medication changes were made.  Please call us if you have new issues that need to be addressed before your next appt.  Your physician wants you to follow-up in: 6 months.  You will receive a reminder letter in the mail two months in advance. If you don't receive a letter, please call our office to schedule the follow-up appointment.   

## 2014-05-07 NOTE — Assessment & Plan Note (Signed)
Edema has improved after several days on Lasix 80 mg daily. I also suspect he has element of chronic venous insufficiency, exacerbated by him sitting watching TV with legs down. Currently edema is minimal

## 2014-05-07 NOTE — Assessment & Plan Note (Signed)
Suggested he continue on his pravastatin.

## 2014-05-07 NOTE — Assessment & Plan Note (Signed)
Blood pressure is well controlled on today's visit. No changes made to the medications. 

## 2014-05-07 NOTE — Assessment & Plan Note (Signed)
Poorly controlled diabetes. Metformin recently held. Possibly from renal dysfunction. Managed by Dr. [name]

## 2014-05-07 NOTE — Assessment & Plan Note (Signed)
We have encouraged continued exercise, careful diet management in an effort to lose weight. 

## 2014-05-13 ENCOUNTER — Other Ambulatory Visit: Payer: Self-pay | Admitting: Neurosurgery

## 2014-05-13 DIAGNOSIS — M47817 Spondylosis without myelopathy or radiculopathy, lumbosacral region: Secondary | ICD-10-CM

## 2014-05-18 ENCOUNTER — Ambulatory Visit
Admission: RE | Admit: 2014-05-18 | Discharge: 2014-05-18 | Disposition: A | Discharge status: Home / Self Care | Payer: Medicare Other | Source: Ambulatory Visit | Attending: Neurosurgery | Admitting: Neurosurgery

## 2014-05-18 ENCOUNTER — Other Ambulatory Visit: Payer: Medicare Other

## 2014-05-18 DIAGNOSIS — M47817 Spondylosis without myelopathy or radiculopathy, lumbosacral region: Secondary | ICD-10-CM | POA: Diagnosis not present

## 2014-06-17 DIAGNOSIS — M549 Dorsalgia, unspecified: Secondary | ICD-10-CM | POA: Diagnosis not present

## 2014-06-17 DIAGNOSIS — J45909 Unspecified asthma, uncomplicated: Secondary | ICD-10-CM | POA: Diagnosis not present

## 2014-06-17 DIAGNOSIS — E119 Type 2 diabetes mellitus without complications: Secondary | ICD-10-CM | POA: Diagnosis not present

## 2014-06-17 DIAGNOSIS — E291 Testicular hypofunction: Secondary | ICD-10-CM | POA: Diagnosis not present

## 2014-06-17 DIAGNOSIS — IMO0002 Reserved for concepts with insufficient information to code with codable children: Secondary | ICD-10-CM | POA: Diagnosis not present

## 2014-06-21 DIAGNOSIS — M4800 Spinal stenosis, site unspecified: Secondary | ICD-10-CM | POA: Diagnosis not present

## 2014-06-21 DIAGNOSIS — M47817 Spondylosis without myelopathy or radiculopathy, lumbosacral region: Secondary | ICD-10-CM | POA: Diagnosis not present

## 2014-06-24 DIAGNOSIS — E1149 Type 2 diabetes mellitus with other diabetic neurological complication: Secondary | ICD-10-CM | POA: Diagnosis not present

## 2014-06-24 DIAGNOSIS — M549 Dorsalgia, unspecified: Secondary | ICD-10-CM | POA: Diagnosis not present

## 2014-06-24 DIAGNOSIS — I83893 Varicose veins of bilateral lower extremities with other complications: Secondary | ICD-10-CM | POA: Diagnosis not present

## 2014-06-24 DIAGNOSIS — E1129 Type 2 diabetes mellitus with other diabetic kidney complication: Secondary | ICD-10-CM | POA: Diagnosis not present

## 2014-06-30 ENCOUNTER — Other Ambulatory Visit: Payer: Self-pay | Admitting: Cardiovascular Disease

## 2014-07-03 DIAGNOSIS — E291 Testicular hypofunction: Secondary | ICD-10-CM | POA: Diagnosis not present

## 2014-07-03 DIAGNOSIS — N138 Other obstructive and reflux uropathy: Secondary | ICD-10-CM | POA: Diagnosis not present

## 2014-07-03 DIAGNOSIS — Z79899 Other long term (current) drug therapy: Secondary | ICD-10-CM | POA: Diagnosis not present

## 2014-07-03 DIAGNOSIS — N401 Enlarged prostate with lower urinary tract symptoms: Secondary | ICD-10-CM | POA: Diagnosis not present

## 2014-07-09 DIAGNOSIS — E291 Testicular hypofunction: Secondary | ICD-10-CM | POA: Diagnosis not present

## 2014-07-10 DIAGNOSIS — E1149 Type 2 diabetes mellitus with other diabetic neurological complication: Secondary | ICD-10-CM | POA: Diagnosis not present

## 2014-07-10 DIAGNOSIS — Z23 Encounter for immunization: Secondary | ICD-10-CM | POA: Diagnosis not present

## 2014-07-10 DIAGNOSIS — I10 Essential (primary) hypertension: Secondary | ICD-10-CM | POA: Diagnosis not present

## 2014-07-10 DIAGNOSIS — M549 Dorsalgia, unspecified: Secondary | ICD-10-CM | POA: Diagnosis not present

## 2014-07-10 DIAGNOSIS — E1129 Type 2 diabetes mellitus with other diabetic kidney complication: Secondary | ICD-10-CM | POA: Diagnosis not present

## 2014-07-10 DIAGNOSIS — J449 Chronic obstructive pulmonary disease, unspecified: Secondary | ICD-10-CM | POA: Diagnosis not present

## 2014-07-16 DIAGNOSIS — I87099 Postthrombotic syndrome with other complications of unspecified lower extremity: Secondary | ICD-10-CM | POA: Diagnosis not present

## 2014-07-16 DIAGNOSIS — M79609 Pain in unspecified limb: Secondary | ICD-10-CM | POA: Diagnosis not present

## 2014-07-16 DIAGNOSIS — E119 Type 2 diabetes mellitus without complications: Secondary | ICD-10-CM | POA: Diagnosis not present

## 2014-07-16 DIAGNOSIS — M7989 Other specified soft tissue disorders: Secondary | ICD-10-CM | POA: Diagnosis not present

## 2014-07-25 ENCOUNTER — Other Ambulatory Visit: Payer: Self-pay | Admitting: Cardiovascular Disease

## 2014-08-07 DIAGNOSIS — E291 Testicular hypofunction: Secondary | ICD-10-CM | POA: Diagnosis not present

## 2014-08-07 DIAGNOSIS — M7989 Other specified soft tissue disorders: Secondary | ICD-10-CM | POA: Diagnosis not present

## 2014-08-09 DIAGNOSIS — M7989 Other specified soft tissue disorders: Secondary | ICD-10-CM | POA: Diagnosis not present

## 2014-08-09 DIAGNOSIS — I87099 Postthrombotic syndrome with other complications of unspecified lower extremity: Secondary | ICD-10-CM | POA: Diagnosis not present

## 2014-08-09 DIAGNOSIS — M79609 Pain in unspecified limb: Secondary | ICD-10-CM | POA: Diagnosis not present

## 2014-08-30 DIAGNOSIS — E291 Testicular hypofunction: Secondary | ICD-10-CM | POA: Diagnosis not present

## 2014-09-05 ENCOUNTER — Other Ambulatory Visit: Payer: Self-pay | Admitting: Cardiovascular Disease

## 2014-09-17 ENCOUNTER — Ambulatory Visit (INDEPENDENT_AMBULATORY_CARE_PROVIDER_SITE_OTHER): Payer: Medicare Other | Admitting: Cardiovascular Disease

## 2014-09-17 ENCOUNTER — Encounter: Payer: Self-pay | Admitting: Cardiovascular Disease

## 2014-09-17 VITALS — BP 126/76 | HR 79 | Ht 71.0 in | Wt 246.0 lb

## 2014-09-17 DIAGNOSIS — I5032 Chronic diastolic (congestive) heart failure: Secondary | ICD-10-CM

## 2014-09-17 DIAGNOSIS — M549 Dorsalgia, unspecified: Secondary | ICD-10-CM

## 2014-09-17 DIAGNOSIS — E1129 Type 2 diabetes mellitus with other diabetic kidney complication: Secondary | ICD-10-CM | POA: Diagnosis not present

## 2014-09-17 DIAGNOSIS — I509 Heart failure, unspecified: Secondary | ICD-10-CM

## 2014-09-17 DIAGNOSIS — E1149 Type 2 diabetes mellitus with other diabetic neurological complication: Secondary | ICD-10-CM | POA: Diagnosis not present

## 2014-09-17 DIAGNOSIS — Z23 Encounter for immunization: Secondary | ICD-10-CM | POA: Diagnosis not present

## 2014-09-17 DIAGNOSIS — M129 Arthropathy, unspecified: Secondary | ICD-10-CM | POA: Diagnosis not present

## 2014-09-17 DIAGNOSIS — E119 Type 2 diabetes mellitus without complications: Secondary | ICD-10-CM

## 2014-09-17 DIAGNOSIS — E669 Obesity, unspecified: Secondary | ICD-10-CM

## 2014-09-17 DIAGNOSIS — I10 Essential (primary) hypertension: Secondary | ICD-10-CM | POA: Diagnosis not present

## 2014-09-17 DIAGNOSIS — G8929 Other chronic pain: Secondary | ICD-10-CM

## 2014-09-17 DIAGNOSIS — E785 Hyperlipidemia, unspecified: Secondary | ICD-10-CM

## 2014-09-17 NOTE — Assessment & Plan Note (Signed)
Blood pressure is well controlled on today's visit. No changes made to the medications. 

## 2014-09-17 NOTE — Assessment & Plan Note (Signed)
Discussed his diet, recommended portion control. Unable to exercise secondary to his back

## 2014-09-17 NOTE — Assessment & Plan Note (Signed)
Recommended that he stay on his pravastatin. Goal LDL less than 70

## 2014-09-17 NOTE — Progress Notes (Signed)
Patient ID: Jerry Parsons, male    DOB: [birth year], 64 y.o.   MRN: [id number]  HPI Comments: Jerry Parsons is a very pleasant 63 year old gentleman, patient of Dr. [name], with a history of multiple back surgeries, spinal stenosis , obesity, poorly controlled diabetes , DVT, PE in March 2011 previously on warfarin, 30 year smoking history,  hypertension, depression, and history of renal disease, s/p arthroscopic knee surgery,   presenting for routine followup. History of obstructive sleep apnea, does not tolerate CPAP, hronic sweating at nighttime which he attributes to the Vicodin. He presents for routine followup  History of atrial flutter, status post cardioversion on 03/14/2013.   2 admissions to the hospital for gastroenteritis/ campylobacter infection. Required a long course of antibiotics. Possible  infection from tainted chicken.   Continues on amiodarone 200 mg daily, metoprolol tartrate 50 mg twice a day (no longer taking anticoagulation)  In followup today, he reports that he is to be scheduled for back surgery with Dr. Glenna Fellows. He has had several back surgeries in the past . He states that his sugars have been running better, typically 120 up to 130 in the mornings, sometimes when he is bad 170 . He is on metformin and insulin .  Is a history of sleep apnea and recently reports feeling very sleepy at the wheel when [place] to the beach . Wife noticed that he was in a daze and crossing over the median . Now she is driving longer distances for him .  Recent lab work showing hematocrit 46, testosterone 324, PSA 0.3   Recently on prednisone for his "legs". Sugars ran high. Recently developed leg swelling. Wife went away and he sat in a recliner with his feet down for close to 2 weeks watching TV, drinking fluids.  Dr. [name] recommended he increase his Lasix. Now back on 40 mg daily down from 80 mg. Creatinine at the end of April was 1.5, BUN 27 Edema has significantly improved.  Metformin recently held  cardiac workup in the 1990s:  cardiac catheterization with "minimal blockage". Previous DVT , he did have shortness of breath.  echocardiogram March 2011 shows normal LV and RV systolic function, mild LVH, diastolic dysfunction, mildly dilated left atrium.  EKG shows normal sinus rhythm with rate 79 beats per minute, no significant ST or T wave changes   Outpatient Encounter Prescriptions as of 09/17/2014  Medication Sig  . amiodarone (PACERONE) 200 MG tablet Take 200 mg by mouth daily.  [name] amitriptyline (ELAVIL) 25 MG tablet Take 25 mg by mouth at bedtime.  [name] aspirin 81 MG tablet Take 81 mg by mouth daily.   . budesonide (RHINOCORT AQUA) 32 MCG/ACT nasal spray Place 1 spray into the nose daily as needed.   . Canagliflozin (INVOKANA) 100 MG TABS Take by mouth daily.  . cyclobenzaprine (FLEXERIL) 10 MG tablet Take 10 mg by mouth 2 (two) times daily.   . diazepam (VALIUM) 2 MG tablet Take 2 mg by mouth every 8 (eight) hours as needed.   . dutasteride (AVODART) 0.5 MG capsule Take 0.5 mg by mouth daily.    [name] esomeprazole (NEXIUM) 40 MG capsule Take 40 mg by mouth 2 (two) times daily.   . fexofenadine (ALLEGRA) 180 MG tablet Take 180 mg by mouth daily.    . furosemide (LASIX) 40 MG tablet Take 40 mg by mouth daily.  [name] gabapentin (NEURONTIN) 800 MG tablet Take 800 mg by mouth 3 (three) times daily.   . hydrochlorothiazide (  HYDRODIURIL) 25 MG tablet TAKE 1 TABLET BY MOUTH EVERY DAY  . Insulin Glargine (LANTUS SOLOSTAR [place]) Inject 20 Units into the skin daily.   . Liraglutide (VICTOZA [place]) 1.2 once in the am.  . metFORMIN (GLUCOPHAGE) 1000 MG tablet Take 1,000 mg by mouth 2 (two) times daily.  . metoprolol (LOPRESSOR) 50 MG tablet TAKE 1 TABLET BY MOUTH TWICE A DAY  . Multiple Vitamins-Minerals (CENTRUM SILVER ULTRA MENS PO) Take by mouth daily.  . NON FORMULARY Depro shot one every 2 weeks  . Omega-3 Fatty Acids (FISH OIL) 1200 MG CAPS Take 1,200 mg by mouth 2 (two) times  daily.  [name] oxyCODONE (OXYCONTIN) 40 MG 12 hr tablet Take 40 mg by mouth 3 (three) times daily.    . Oxycodone HCl 10 MG TABS Take 10 mg by mouth as needed.  . pravastatin (PRAVACHOL) 10 MG tablet Take 10 mg by mouth daily.  . ranitidine (ZANTAC) 300 MG tablet Take 300 mg by mouth at bedtime.    . sitaGLIPtan (JANUVIA) 100 MG tablet Take 100 mg by mouth daily.    [name] testosterone cypionate (DEPOTESTOTERONE CYPIONATE) 200 MG/ML injection     Review of Systems  Constitutional: Negative.   HENT: Negative.   Eyes: Negative.   Respiratory: Negative.   Cardiovascular: Negative.   Gastrointestinal: Negative.   Endocrine: Negative.   Musculoskeletal: Positive for arthralgias, back pain and gait problem.  Skin: Negative.   Allergic/Immunologic: Negative.   Neurological: Negative.   Hematological: Negative.   Psychiatric/Behavioral: Negative.   All other systems reviewed and are negative.  BP 126/76  Pulse 79  Ht 5\' 11"  (1.803 m)  Wt 246 lb (111.585 kg)  BMI 34.33 kg/m2  Physical Exam  Nursing note and vitals reviewed. Constitutional: He is oriented to person, place, and time. He appears well-developed and well-nourished.  Obese  HENT:  Head: Normocephalic.  Nose: Nose normal.  Mouth/Throat: Oropharynx is clear and moist.  Eyes: Conjunctivae are normal. Pupils are equal, round, and reactive to light.  Neck: Normal range of motion. Neck supple. No JVD present.  Cardiovascular: Normal rate, S1 normal, S2 normal, normal heart sounds and intact distal pulses.  An irregularly irregular rhythm present. Exam reveals no gallop and no friction rub.   No murmur heard. Pulmonary/Chest: Effort normal and breath sounds normal. No respiratory distress. He has no wheezes. He has no rales. He exhibits no tenderness.  Abdominal: Soft. Bowel sounds are normal. He exhibits no distension. There is no tenderness.  Musculoskeletal: Normal range of motion. He exhibits no edema and no tenderness.   Lymphadenopathy:    He has no cervical adenopathy.  Neurological: He is alert and oriented to person, place, and time. Coordination normal.  Skin: Skin is warm and dry. No rash noted. No erythema.  Psychiatric: He has a normal mood and affect. His behavior is normal. Judgment and thought content normal.      Assessment and Plan

## 2014-09-17 NOTE — Patient Instructions (Signed)
You are doing well. No medication changes were made.  Please call us if you have new issues that need to be addressed before your next appt.  Your physician wants you to follow-up in: 6 months.  You will receive a reminder letter in the mail two months in advance. If you don't receive a letter, please call our office to schedule the follow-up appointment.   

## 2014-09-17 NOTE — Assessment & Plan Note (Signed)
We have encouraged continued exercise, careful diet management in an effort to lose weight. 

## 2014-09-17 NOTE — Assessment & Plan Note (Signed)
To be susceptible risk for upcoming lumbar spinal surgery with Dr. Glenna Fellows. Currently with no symptoms of angina with exertion. He would be acceptable risk. No further testing needed.

## 2014-09-17 NOTE — Assessment & Plan Note (Signed)
Relatively euvolemic on today's visit. He takes 40 mg daily.

## 2014-09-20 DIAGNOSIS — E291 Testicular hypofunction: Secondary | ICD-10-CM | POA: Diagnosis not present

## 2014-09-25 DIAGNOSIS — Z01818 Encounter for other preprocedural examination: Secondary | ICD-10-CM | POA: Diagnosis not present

## 2014-09-25 DIAGNOSIS — M48061 Spinal stenosis, lumbar region without neurogenic claudication: Secondary | ICD-10-CM | POA: Diagnosis not present

## 2014-09-25 DIAGNOSIS — M47817 Spondylosis without myelopathy or radiculopathy, lumbosacral region: Secondary | ICD-10-CM | POA: Diagnosis not present

## 2014-09-30 DIAGNOSIS — M810 Age-related osteoporosis without current pathological fracture: Secondary | ICD-10-CM | POA: Diagnosis present

## 2014-09-30 DIAGNOSIS — M47816 Spondylosis without myelopathy or radiculopathy, lumbar region: Secondary | ICD-10-CM | POA: Diagnosis not present

## 2014-09-30 DIAGNOSIS — M4306 Spondylolysis, lumbar region: Secondary | ICD-10-CM | POA: Diagnosis not present

## 2014-09-30 DIAGNOSIS — I9789 Other postprocedural complications and disorders of the circulatory system, not elsewhere classified: Secondary | ICD-10-CM | POA: Diagnosis not present

## 2014-09-30 DIAGNOSIS — Z79899 Other long term (current) drug therapy: Secondary | ICD-10-CM | POA: Diagnosis not present

## 2014-09-30 DIAGNOSIS — M47896 Other spondylosis, lumbar region: Secondary | ICD-10-CM | POA: Diagnosis not present

## 2014-09-30 DIAGNOSIS — Z86718 Personal history of other venous thrombosis and embolism: Secondary | ICD-10-CM | POA: Diagnosis not present

## 2014-09-30 DIAGNOSIS — I4891 Unspecified atrial fibrillation: Secondary | ICD-10-CM | POA: Diagnosis not present

## 2014-09-30 DIAGNOSIS — M4806 Spinal stenosis, lumbar region: Secondary | ICD-10-CM | POA: Diagnosis not present

## 2014-09-30 DIAGNOSIS — R000 Tachycardia, unspecified: Secondary | ICD-10-CM | POA: Diagnosis not present

## 2014-09-30 DIAGNOSIS — Z981 Arthrodesis status: Secondary | ICD-10-CM | POA: Diagnosis not present

## 2014-09-30 DIAGNOSIS — I480 Paroxysmal atrial fibrillation: Secondary | ICD-10-CM | POA: Diagnosis present

## 2014-09-30 DIAGNOSIS — E780 Pure hypercholesterolemia: Secondary | ICD-10-CM | POA: Diagnosis present

## 2014-09-30 DIAGNOSIS — I10 Essential (primary) hypertension: Secondary | ICD-10-CM | POA: Diagnosis present

## 2014-09-30 DIAGNOSIS — I4892 Unspecified atrial flutter: Secondary | ICD-10-CM | POA: Diagnosis not present

## 2014-09-30 DIAGNOSIS — K219 Gastro-esophageal reflux disease without esophagitis: Secondary | ICD-10-CM | POA: Diagnosis present

## 2014-09-30 DIAGNOSIS — E119 Type 2 diabetes mellitus without complications: Secondary | ICD-10-CM | POA: Diagnosis present

## 2014-09-30 DIAGNOSIS — E785 Hyperlipidemia, unspecified: Secondary | ICD-10-CM | POA: Diagnosis present

## 2014-09-30 DIAGNOSIS — Z794 Long term (current) use of insulin: Secondary | ICD-10-CM | POA: Diagnosis not present

## 2014-09-30 DIAGNOSIS — Z87891 Personal history of nicotine dependence: Secondary | ICD-10-CM | POA: Diagnosis not present

## 2014-09-30 DIAGNOSIS — Z86711 Personal history of pulmonary embolism: Secondary | ICD-10-CM | POA: Diagnosis not present

## 2014-09-30 DIAGNOSIS — Z7982 Long term (current) use of aspirin: Secondary | ICD-10-CM | POA: Diagnosis not present

## 2014-10-07 ENCOUNTER — Telehealth: Payer: Self-pay

## 2014-10-07 NOTE — Telephone Encounter (Signed)
Spoke w/ pt's wife.  She reports that pt had back surgery last week at [facility] in [place].   Pt held his oxy before the procedure and went into aflutter, had cardioversion last Friday. Dr. Shearon Stalls advised that pt should not have held his blood thinners prior to back surgery. She states that pt was discharged on amiodarone 200mg  daily.  Reports that when Dr. Rockey Situ previously did cardioversion, pt was given other meds and she wants to make sure that Dr. Rockey Situ agrees w/ this.  Pt is otherwise doing well and recovering. Please advise.  Thank you.

## 2014-10-07 NOTE — Telephone Encounter (Signed)
Pt wife called, is concerned pt is not getting the medications he needs. Pt had surgery last Monday, and he had aflutter. Please call.

## 2014-10-08 NOTE — Telephone Encounter (Signed)
I would agree with staying on his amiodarone Likely stress of the surgery caused his arrhythmia Would stay on blood thinners for now We can discuss whether he should stay on blood thinners at a later date after he has recovered Would let us know the name of his blood thinner

## 2014-10-09 NOTE — Telephone Encounter (Signed)
Spoke w/ pt's wife.  She reports that pt is only taking aspirin 81mg , which he has resumed. She reports that pt was given samples of Pradaxa and advised to take if he went back into afib, but he has not taken these at all.  Advised her of Dr. Donivan Scull recommendation of waiting to discuss after his recovery. Pt's wife is adamant that pt is not on enough amiodarone at this time.  She feels that 200mg  once daily is not enough and would like for me to stress this to Dr. Rockey Situ.  Please advise.  Thank you.

## 2014-10-09 NOTE — Telephone Encounter (Signed)
Certainly he could do amiodarone 200 mg twice a day for a week or 2 after his surgery Once he has fully recovered, would go to once a day Once a day is the long-term management with amiodarone  Higher doses of amiodarone for prolonged periods of time can lead to various toxicities/side effects

## 2014-10-10 NOTE — Telephone Encounter (Signed)
Spoke w/ pt's wife.  Advised her of Dr. Gollan's recommendation. She verbalizes understanding and will call back w/ any questions or concerns.  

## 2014-10-11 DIAGNOSIS — Z981 Arthrodesis status: Secondary | ICD-10-CM | POA: Diagnosis not present

## 2014-10-11 DIAGNOSIS — M47896 Other spondylosis, lumbar region: Secondary | ICD-10-CM | POA: Diagnosis not present

## 2014-10-11 DIAGNOSIS — M47817 Spondylosis without myelopathy or radiculopathy, lumbosacral region: Secondary | ICD-10-CM | POA: Diagnosis not present

## 2014-11-12 DIAGNOSIS — E291 Testicular hypofunction: Secondary | ICD-10-CM | POA: Diagnosis not present

## 2014-11-14 IMAGING — CR DG CHEST 2V
1 series · 2 of 2 positions shown · non-contrast
Comparison: none

REASON FOR EXAM: pre procedure
COMMENTS:

[Series 1: w chest pa · 0.14mm/px · 2 of 2 slices shown]
[im 1/2]
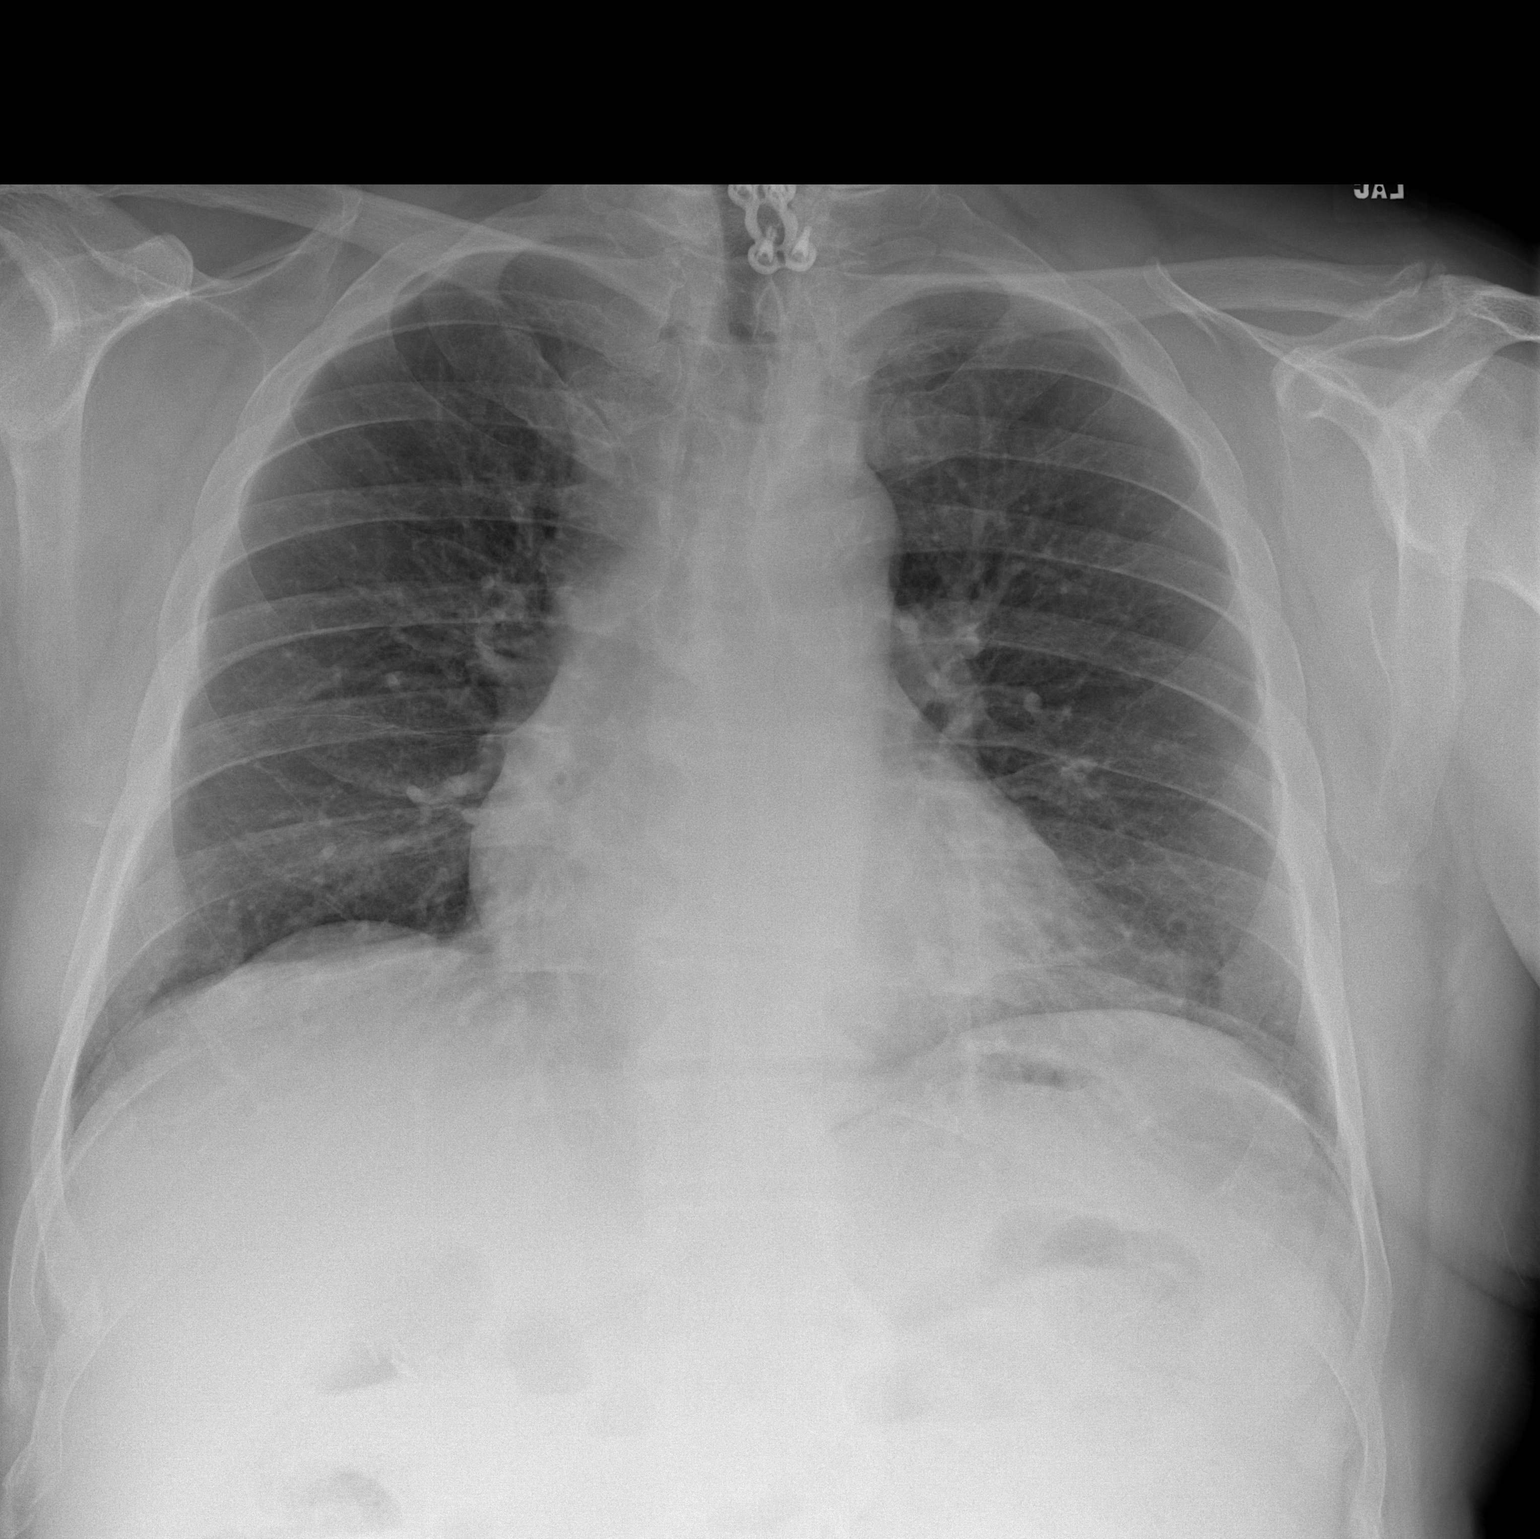
[im 2/2]
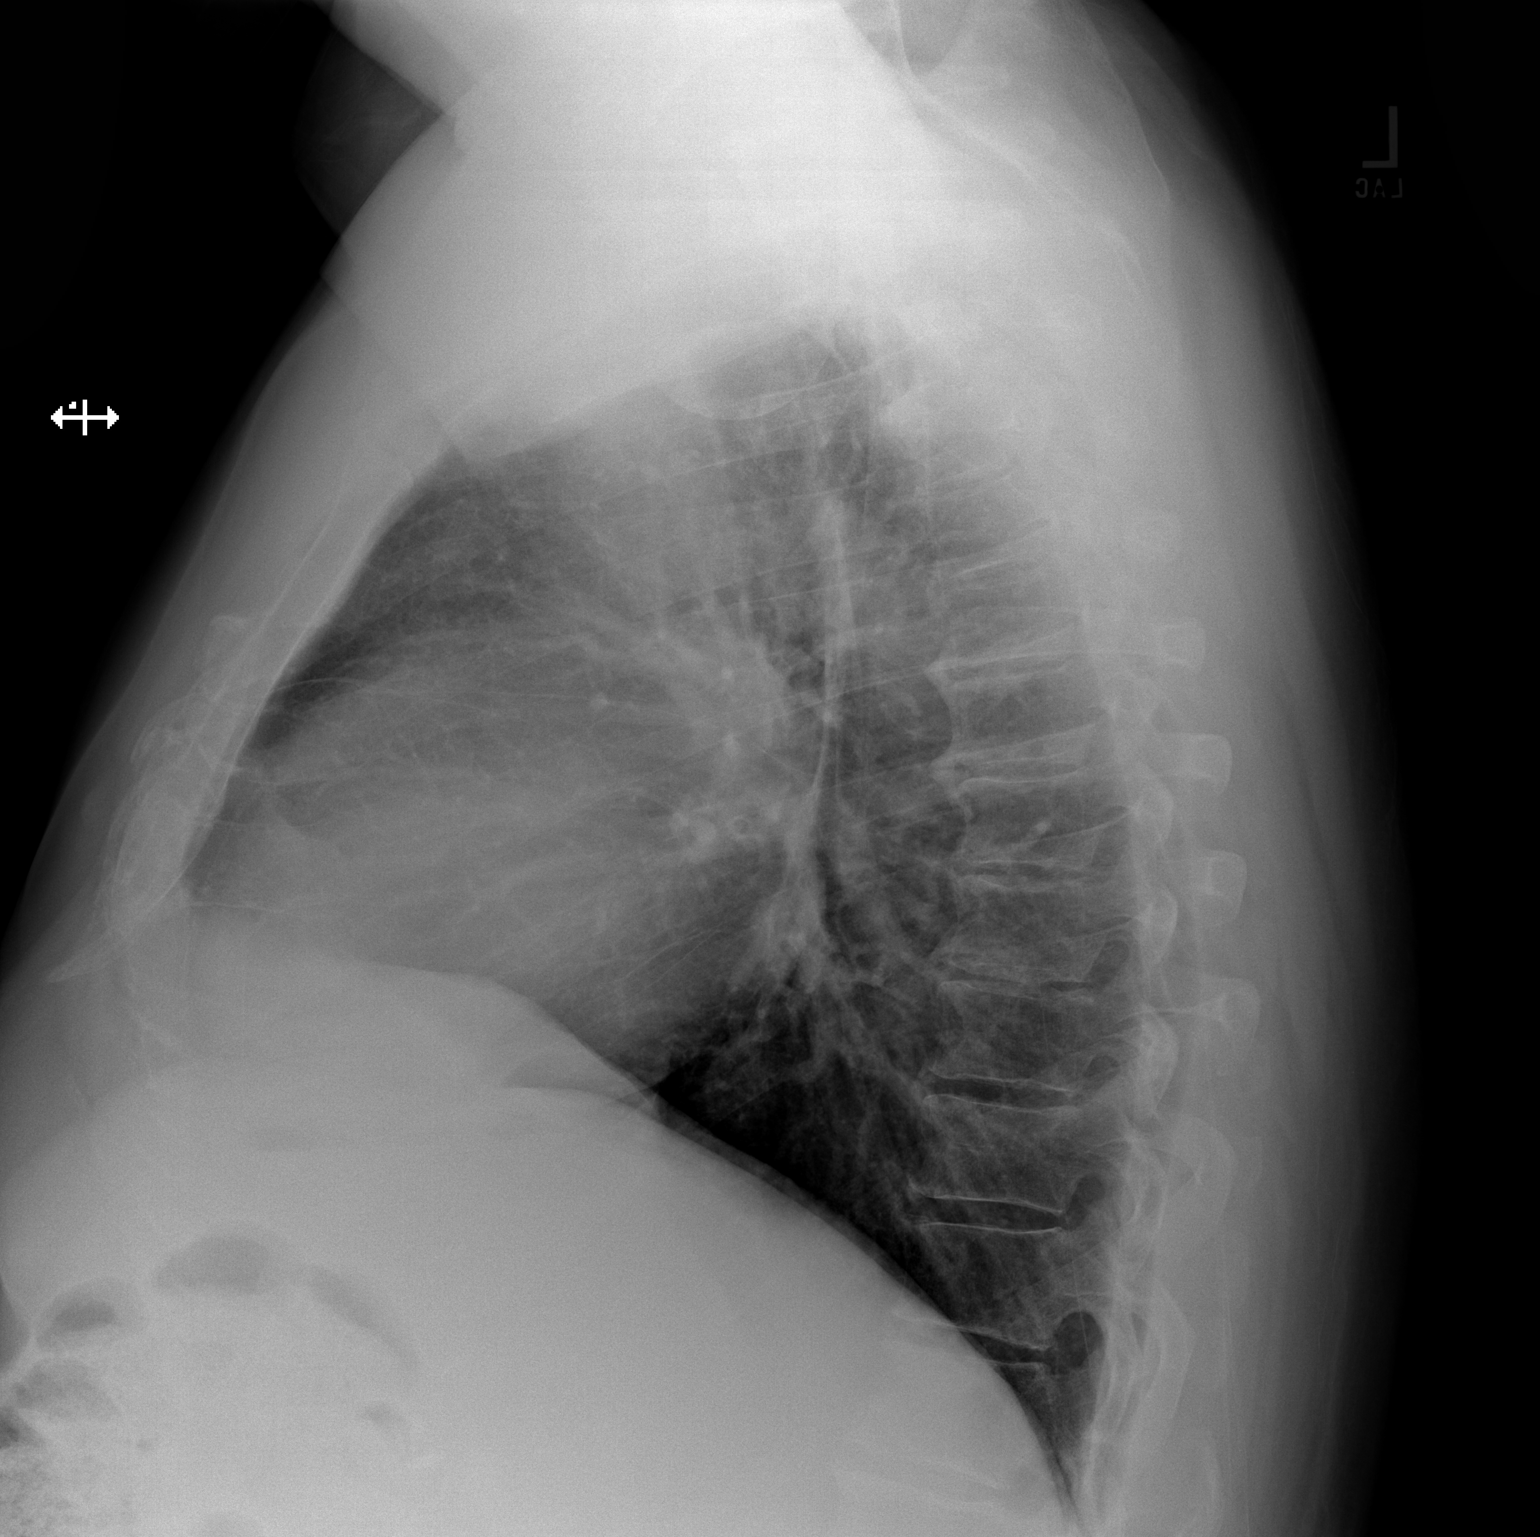

[2 of 2 positions shown; findings below may reference images not displayed]

PROCEDURE:     DXR - DXR CHEST PA (OR AP) AND LATERAL  - March 09, 2013 [DATE]

RESULT:     Comparison is made to the study February 08, 2012.

The lungs are reasonably well inflated. The interstitial markings are mildly
prominent. The cardiac silhouette is normal in size. The mediastinum is
normal in width. There is no pleural effusion or pneumothorax. The bony
thorax exhibits no acute abnormality.
IMPRESSION: There is no evidence of acute cardiopulmonary abnormality.
Minimal prominence of the interstitial markings is chronic. Is there a
history of smoking?

[REDACTED]

## 2014-11-29 ENCOUNTER — Other Ambulatory Visit: Payer: Self-pay | Admitting: Cardiovascular Disease

## 2014-12-13 DIAGNOSIS — E291 Testicular hypofunction: Secondary | ICD-10-CM | POA: Diagnosis not present

## 2015-01-14 DIAGNOSIS — E119 Type 2 diabetes mellitus without complications: Secondary | ICD-10-CM | POA: Diagnosis not present

## 2015-01-14 DIAGNOSIS — I10 Essential (primary) hypertension: Secondary | ICD-10-CM | POA: Diagnosis not present

## 2015-01-14 DIAGNOSIS — I2510 Atherosclerotic heart disease of native coronary artery without angina pectoris: Secondary | ICD-10-CM | POA: Diagnosis not present

## 2015-01-14 DIAGNOSIS — E785 Hyperlipidemia, unspecified: Secondary | ICD-10-CM | POA: Diagnosis not present

## 2015-01-14 DIAGNOSIS — E669 Obesity, unspecified: Secondary | ICD-10-CM | POA: Diagnosis not present

## 2015-01-21 DIAGNOSIS — M170 Bilateral primary osteoarthritis of knee: Secondary | ICD-10-CM | POA: Diagnosis not present

## 2015-02-06 ENCOUNTER — Other Ambulatory Visit: Payer: Self-pay | Admitting: *Deleted

## 2015-02-06 MED ORDER — AMIODARONE HCL 200 MG PO TABS: 200.0000 mg | ORAL_TABLET | Freq: Every day | ORAL | Status: DC

## 2015-02-06 MED ORDER — HYDROCHLOROTHIAZIDE 25 MG PO TABS: 25.0000 mg | ORAL_TABLET | Freq: Every day | ORAL | Status: DC

## 2015-02-06 MED ORDER — METOPROLOL TARTRATE 50 MG PO TABS: 50.0000 mg | ORAL_TABLET | Freq: Two times a day (BID) | ORAL | Status: DC

## 2015-02-07 DIAGNOSIS — E780 Pure hypercholesterolemia: Secondary | ICD-10-CM | POA: Diagnosis not present

## 2015-02-11 DIAGNOSIS — I2510 Atherosclerotic heart disease of native coronary artery without angina pectoris: Secondary | ICD-10-CM | POA: Diagnosis not present

## 2015-02-11 DIAGNOSIS — E669 Obesity, unspecified: Secondary | ICD-10-CM | POA: Diagnosis not present

## 2015-02-11 DIAGNOSIS — E785 Hyperlipidemia, unspecified: Secondary | ICD-10-CM | POA: Diagnosis not present

## 2015-02-11 LAB — BASIC METABOLIC PANEL
BUN: 29 mg/dL — AB (ref 4–21)
Creatinine: 1.4 mg/dL — AB (ref 0.6–1.3)
Glucose: 255 mg/dL
Potassium: 4.5 mmol/L (ref 3.4–5.3)
Sodium: 137 mmol/L (ref 137–147)

## 2015-02-11 LAB — CBC AND DIFFERENTIAL
HCT: 49 % (ref 41–53)
Hemoglobin: 16.3 g/dL (ref 13.5–17.5)
Neutrophils Absolute: 8 /uL
Platelets: 200 10*3/uL (ref 150–399)
WBC: 9.8 10^3/mL

## 2015-02-11 LAB — HEPATIC FUNCTION PANEL
ALT: 29 U/L (ref 10–40)
Alkaline Phosphatase: 118 U/L (ref 25–125)
Bilirubin, Total: 0.3 mg/dL

## 2015-02-26 DIAGNOSIS — E119 Type 2 diabetes mellitus without complications: Secondary | ICD-10-CM | POA: Diagnosis not present

## 2015-02-26 DIAGNOSIS — E669 Obesity, unspecified: Secondary | ICD-10-CM | POA: Diagnosis not present

## 2015-02-26 DIAGNOSIS — E785 Hyperlipidemia, unspecified: Secondary | ICD-10-CM | POA: Diagnosis not present

## 2015-02-27 DIAGNOSIS — N138 Other obstructive and reflux uropathy: Secondary | ICD-10-CM | POA: Diagnosis not present

## 2015-02-27 DIAGNOSIS — Z125 Encounter for screening for malignant neoplasm of prostate: Secondary | ICD-10-CM | POA: Diagnosis not present

## 2015-02-27 DIAGNOSIS — E291 Testicular hypofunction: Secondary | ICD-10-CM | POA: Diagnosis not present

## 2015-03-19 ENCOUNTER — Ambulatory Visit (INDEPENDENT_AMBULATORY_CARE_PROVIDER_SITE_OTHER): Payer: Medicare Other | Admitting: Cardiovascular Disease

## 2015-03-19 ENCOUNTER — Encounter: Payer: Self-pay | Admitting: Cardiovascular Disease

## 2015-03-19 VITALS — BP 120/60 | HR 79 | Ht 71.0 in | Wt 243.5 lb

## 2015-03-19 DIAGNOSIS — I10 Essential (primary) hypertension: Secondary | ICD-10-CM | POA: Diagnosis not present

## 2015-03-19 DIAGNOSIS — E1165 Type 2 diabetes mellitus with hyperglycemia: Secondary | ICD-10-CM

## 2015-03-19 DIAGNOSIS — I5032 Chronic diastolic (congestive) heart failure: Secondary | ICD-10-CM | POA: Diagnosis not present

## 2015-03-19 DIAGNOSIS — I4892 Unspecified atrial flutter: Secondary | ICD-10-CM | POA: Diagnosis not present

## 2015-03-19 DIAGNOSIS — E118 Type 2 diabetes mellitus with unspecified complications: Secondary | ICD-10-CM

## 2015-03-19 DIAGNOSIS — E785 Hyperlipidemia, unspecified: Secondary | ICD-10-CM

## 2015-03-19 DIAGNOSIS — E669 Obesity, unspecified: Secondary | ICD-10-CM

## 2015-03-19 DIAGNOSIS — IMO0002 Reserved for concepts with insufficient information to code with codable children: Secondary | ICD-10-CM

## 2015-03-19 NOTE — Patient Instructions (Signed)
You are doing well. No medication changes were made.  Please call us if you have new issues that need to be addressed before your next appt.  Your physician wants you to follow-up in: 6 months.  You will receive a reminder letter in the mail two months in advance. If you don't receive a letter, please call our office to schedule the follow-up appointment.   

## 2015-03-19 NOTE — Assessment & Plan Note (Addendum)
Strongly recommended he stop eating his snack before bed. Without his snack, sugars are 115 this morning

## 2015-03-19 NOTE — Assessment & Plan Note (Signed)
Blood pressure is well controlled on today's visit. No changes made to the medications. 

## 2015-03-19 NOTE — Progress Notes (Signed)
Patient ID: Jerry Parsons, male    DOB: 1951-10-01, 64 y.o.   MRN: [id number]  HPI Comments: Jerry Parsons is a very pleasant 64 year old gentleman, patient of Dr. [name], with a history of multiple back surgeries, spinal stenosis , obesity, poorly controlled diabetes , DVT, PE in March 2011 previously on warfarin, 30 year smoking history,  hypertension, depression, and history of renal disease, s/p arthroscopic knee surgery,   presenting for routine followup of his hypertension and cardiac risk factors. . History of obstructive sleep apnea, does not tolerate CPAP, chronic sweating at nighttime which he attributes to the Vicodin.  In follow-up today, he reports that sugars have been elevated but improved recently after he stopped eating his next at nighttime. Yesterday reports he fell asleep without eating a snack and his sugar this morning is 115. Typically he eats desserts/sweets before bed and this has caused his numbers to run high. He is going to try to do better. Previously was eating an apple as well as other treats.  He reports his knee pain is severe and he needs a total knee replacement which she plans to do at [facility]. He is not seeing them yet to set this up.  Reports having back surgery with Dr. Carloyn Manner, postop had severe pain, developed atrial fibrillation. Was in the hospital in [facility] And required cardioversion for atrial fibrillation Since then he feels he has been in normal sinus rhythm. He attributes the arrhythmia to the severe pain following the surgery.   Weight continues to be an issue, still elevated Recently started on low-dose cholesterol medication. Total cholesterol 227, LDL 48, triglycerides very elevated at 1200  EKG on today's visit shows normal sinus rhythm with rate 79 bpm, no significant ST or T-wave changes  Other past medical history History of atrial flutter, status post cardioversion on 03/14/2013.   2 admissions to the hospital for  gastroenteritis/ campylobacter infection. Required a long course of antibiotics. Possible  infection from tainted chicken.  Continues on amiodarone 200 mg daily, metoprolol tartrate 50 mg twice a day (no longer taking anticoagulation)   Previously on prednisone for his "legs". Sugars ran high. Previously developed leg swelling. Wife went away and he sat in a recliner with his feet down for close to 2 weeks watching TV, drinking fluids.  Creatinine at the end of April was 1.5, BUN 27, possibly from overdiuresis  cardiac workup in the 1990s:  cardiac catheterization with "minimal blockage". Previous DVT , he did have shortness of breath.  echocardiogram March 2011 shows normal LV and RV systolic function, mild LVH, diastolic dysfunction, mildly dilated left atrium.  No Known Allergies  Outpatient Encounter Prescriptions as of 03/19/2015  Medication Sig  . amiodarone (PACERONE) 200 MG tablet Take 1 tablet (200 mg total) by mouth daily.  [name] amitriptyline (ELAVIL) 25 MG tablet Take 25 mg by mouth at bedtime.  [name] aspirin 81 MG tablet Take 81 mg by mouth daily.   . budesonide (RHINOCORT AQUA) 32 MCG/ACT nasal spray Place 1 spray into the nose daily as needed.   . canagliflozin (INVOKANA) 100 MG TABS tablet Take 100 mg by mouth daily.  . Canagliflozin (INVOKANA) 100 MG TABS Take by mouth daily.  . cyclobenzaprine (FLEXERIL) 10 MG tablet Take 10 mg by mouth 2 (two) times daily.   . diazepam (VALIUM) 2 MG tablet Take 2 mg by mouth every 8 (eight) hours as needed.   . dutasteride (AVODART) 0.5 MG capsule Take 0.5 mg by  mouth daily.    [name] esomeprazole (NEXIUM) 40 MG capsule Take 40 mg by mouth 2 (two) times daily.   . fexofenadine (ALLEGRA) 180 MG tablet Take 180 mg by mouth daily.    . furosemide (LASIX) 40 MG tablet Take 40 mg by mouth daily.  [name] gabapentin (NEURONTIN) 800 MG tablet Take 800 mg by mouth 3 (three) times daily.   . hydrochlorothiazide (HYDRODIURIL) 25 MG tablet Take 1 tablet (25 mg  total) by mouth daily. (Patient taking differently: Take 12.5 mg by mouth daily. )  . Insulin Glargine (LANTUS SOLOSTAR [place]) Inject 20 Units into the skin daily.   . Liraglutide (VICTOZA [place]) 1.2 once in the am.  . Liraglutide 18 MG/3ML SOPN Inject into the skin.  . metFORMIN (GLUCOPHAGE) 1000 MG tablet Take 1,000 mg by mouth 2 (two) times daily.  . metoprolol (LOPRESSOR) 50 MG tablet Take 1 tablet (50 mg total) by mouth 2 (two) times daily.  . Multiple Vitamins-Minerals (CENTRUM SILVER ULTRA MENS PO) Take by mouth daily.  . NON FORMULARY Depro shot one every 2 weeks  . Omega-3 Fatty Acids (FISH OIL) 1200 MG CAPS Take 1,200 mg by mouth 2 (two) times daily.  [name] oxyCODONE (OXYCONTIN) 40 MG 12 hr tablet Take 40 mg by mouth 3 (three) times daily.    . Oxycodone HCl 10 MG TABS Take 10 mg by mouth as needed.  . pravastatin (PRAVACHOL) 10 MG tablet Take 10 mg by mouth daily.  . ranitidine (ZANTAC) 300 MG tablet Take 300 mg by mouth at bedtime.    . sitaGLIPtan (JANUVIA) 100 MG tablet Take 100 mg by mouth daily.    [name] testosterone cypionate (DEPOTESTOTERONE CYPIONATE) 200 MG/ML injection     Past Medical History  Diagnosis Date  . Hypertension   . Arrhythmia     tachycardia  . DVT (deep venous thrombosis) 02/2010  . Diabetes mellitus   . Pulmonary embolus   . Kidney failure   . Sleep apnea   . Neuropathy   . GERD (gastroesophageal reflux disease)   . Depression   . BPH (benign prostatic hyperplasia)   . 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     x2    Past Surgical History  Procedure Laterality Date  . Gallbladder surgery  2002  . Rotator cuff repair  2001  . Back surgery      x 8; upper x 3 & lower x 5  . Cardioversion      Social History  reports that he quit smoking about 24 years ago. His smoking use included Cigarettes. He has a 25 pack-year smoking history. He does not have any smokeless tobacco history on file. He reports that he does not drink alcohol or use illicit  drugs.  Family History family history includes Heart attack in his brother.  Review of Systems  Constitutional: Negative.   Respiratory: Negative.   Cardiovascular: Negative.   Gastrointestinal: Negative.   Musculoskeletal: Positive for back pain, arthralgias and gait problem.  Skin: Negative.   Neurological: Negative.   Hematological: Negative.   Psychiatric/Behavioral: Negative.   All other systems reviewed and are negative.  BP 120/60 mmHg  Pulse 79  Ht 5\' 11"  (1.803 m)  Wt 243 lb 8 oz (110.451 kg)  BMI 33.98 kg/m2  Physical Exam  Constitutional: He is oriented to person, place, and time. He appears well-developed and well-nourished.  Obese  HENT:  Head: Normocephalic.  Nose: Nose normal.  Mouth/Throat: Oropharynx is clear and moist.  Eyes:  Conjunctivae are normal. Pupils are equal, round, and reactive to light.  Neck: Normal range of motion. Neck supple. No JVD present.  Cardiovascular: Normal rate, regular rhythm, S1 normal, S2 normal, normal heart sounds and intact distal pulses.  Exam reveals no gallop and no friction rub.   No murmur heard. Pulmonary/Chest: Effort normal and breath sounds normal. No respiratory distress. He has no wheezes. He has no rales. He exhibits no tenderness.  Abdominal: Soft. Bowel sounds are normal. He exhibits no distension. There is no tenderness.  Musculoskeletal: Normal range of motion. He exhibits no edema or tenderness.  Lymphadenopathy:    He has no cervical adenopathy.  Neurological: He is alert and oriented to person, place, and time. Coordination normal.  Skin: Skin is warm and dry. No rash noted. No erythema.  Psychiatric: He has a normal mood and affect. His behavior is normal. Judgment and thought content normal.      Assessment and Plan   Nursing note and vitals reviewed.

## 2015-03-19 NOTE — Assessment & Plan Note (Signed)
Currently on Lasix daily. Appears relatively euvolemic on today's visit also on low-dose HCTZ

## 2015-03-19 NOTE — Assessment & Plan Note (Signed)
Previous episodes of atrial flutter. He reports having atrial fibrillation in postop in the setting of severe pain  following back surgery 2014. EKG unavailable. This was done in [place], possibly admitted to [facility]. He needed cardioversion at the time. Denies having any further arrhythmia since that time. No medication changes made today. Currently not on anticoagulation. He will stay on amiodarone.

## 2015-03-19 NOTE — Assessment & Plan Note (Signed)
We have encouraged continued exercise, careful diet management in an effort to lose weight. 

## 2015-03-19 NOTE — Assessment & Plan Note (Signed)
Encouraged him to stay on the pravastatin. This may need titration upwards at a later date depending on his lab work. Recommended strict diabetes control, dietary changes, stopping the snack before bed. Without this, sugars should improve, cholesterol should improve

## 2015-04-02 DIAGNOSIS — M170 Bilateral primary osteoarthritis of knee: Secondary | ICD-10-CM | POA: Diagnosis not present

## 2015-04-07 DIAGNOSIS — E780 Pure hypercholesterolemia: Secondary | ICD-10-CM | POA: Diagnosis not present

## 2015-04-07 DIAGNOSIS — G629 Polyneuropathy, unspecified: Secondary | ICD-10-CM | POA: Diagnosis not present

## 2015-04-07 LAB — LIPID PANEL
Cholesterol: 171 mg/dL (ref 0–200)
HDL: 41 mg/dL (ref 35–70)
LDL Cholesterol: 78 mg/dL
LDl/HDL Ratio: 1.9
Triglycerides: 259 mg/dL — AB (ref 40–160)

## 2015-04-18 DIAGNOSIS — N271 Small kidney, bilateral: Secondary | ICD-10-CM | POA: Diagnosis not present

## 2015-04-18 DIAGNOSIS — R350 Frequency of micturition: Secondary | ICD-10-CM | POA: Diagnosis not present

## 2015-04-18 DIAGNOSIS — R351 Nocturia: Secondary | ICD-10-CM | POA: Diagnosis not present

## 2015-04-18 DIAGNOSIS — N5201 Erectile dysfunction due to arterial insufficiency: Secondary | ICD-10-CM | POA: Diagnosis not present

## 2015-04-18 DIAGNOSIS — E291 Testicular hypofunction: Secondary | ICD-10-CM | POA: Diagnosis not present

## 2015-04-18 DIAGNOSIS — N401 Enlarged prostate with lower urinary tract symptoms: Secondary | ICD-10-CM | POA: Diagnosis not present

## 2015-04-18 NOTE — Discharge Summary (Signed)
PATIENT NAME:  Jerry Parsons, Jerry Parsons MR#:  [id number] DATE OF BIRTH:  [birth year]  DATE OF ADMISSION:  04/12/2013 DATE OF DISCHARGE:  04/13/2013  ADMISSION DIAGNOSIS: Campylobacter diarrhea.   DISCHARGE DIAGNOSES:  1. Campylobacter diarrhea, likely secondary to resistance to fluoroquinolones.  2. Dizziness.  3. Atrial fibrillation.  4. Chronic pain.  5. Diabetes.  CONSULTATION: Dr. Clayborn Bigness.   HOSPITAL COURSE: A 64 year old male who was discharged with ciprofloxacin for Campylobacter diarrhea, who presented with dizziness and diarrhea. For further details, please refer to the H and P.   1. Campylobacter diarrhea. The patient's Campylobacter antigen was still positive. We suspect this is secondary to resistance to fluoroquinolones. We appreciate Dr. Fabio Asa consult. He was placed on azithromycin. His diarrhea actually has improved. He is not having any abdominal pain, and he tolerated his low residual diet. The patient is also on amiodarone for his atrial fibrillation. I did call the pharmacy, and there is no strong contraindication to using both of these medications together. Azithromycin and amiodarone can increase the amiodarone drug level; however, he is only on 5 days, and we did instruct him, if he has symptoms of arrhythmias, to report to the ER.  2. Dizziness, resolved. We suspect this is related to his diarrhea.  3. Atrial fibrillation. The patient is in normal sinus rhythm. He will continue his outpatient medications, which do include amiodarone.  4. Chronic back pain, on chronic narcotics.  5. Diabetes. The patient will resume his outpatient medications.   DISCHARGE MEDICATIONS:  1. Aspirin 325 daily.  2. Pradaxa 150 b.i.d.  3. Amiodarone 200 mg b.i.d.  4. Avodart 0.5 mg daily.  5. Diazepam 2 mg t.i.d.  6. Fexofenadine 180 mg daily.  7. Glimepiride 2 mg daily.  8. Ranitidine 300 mg daily.  9. Metoprolol 50 mg b.i.d.  10. Cyclobenzaprine 10 mg b.i.d.  11. Rhinocort 32 mcg nasal  spray daily p.r.n.  12. Pravastatin 10 mg at bedtime.  13. Gabapentin 800 mg t.i.d.  14. Lantus 20 units daily.  15. HCTZ 12.5 mg daily.  16. Fish oil 1200 mg b.i.d.  17. OxyContin 40 mg t.i.d.   18. Allegra 180 mg daily.  19. Centrum Silver daily.  20. Florastor 2 tablets b.i.d.  21. Januvia 100 mg b.i.d.  22. Lasix 20 mg b.i.d.  23. Invokana 100 mg daily.  25. Liraglutide 18 mg 3 subcutaneous daily.  26. Victoza 1.5 subcutaneous daily.  27. Azithromycin 500 mg daily for 3 days.  28. Nexium 40 mg b.i.d.   DISCHARGE DIET: Low sodium, ADA diet, low residual with yogurt for 4 days.   DISCHARGE ACTIVITY: As tolerated.   DISCHARGE FOLLOWUP: The patient will need to follow up with his primary care physician. The patient can follow up with Dr. Vira Agar in 1 to 2 weeks.   TIME SPENT: Approximately 35 minutes.   ____________________________ Donell Beers. Benjie Karvonen, MD spm:OSi D: 04/13/2013 15:21:47 ET T: 04/14/2013 11:57:48 ET JOB#: [id number]  cc: Lawanna Cecere P. Benjie Karvonen, MD, <Dictator> Richard L. [name], MD Manya Silvas, MD Donell Beers Samuel Mcpeek MD ELECTRONICALLY SIGNED 04/15/2013 12:42

## 2015-04-18 NOTE — Consult Note (Signed)
PATIENT NAME:  Jerry Parsons, Jerry Parsons MR#:  [id number] DATE OF BIRTH:  [birth year]  DATE OF CONSULTATION:  04/12/2013  REFERRING PHYSICIAN:  Dr. Verdell Carmine CONSULTING PHYSICIAN:  Heinz Knuckles. Orlin Kann, MD  REASON FOR CONSULTATION:  Diarrhea.   HISTORY OF PRESENT ILLNESS:  The patient is a 64 year old male with a past history significant for diabetes, DVT and PE who was admitted today with diarrhea for approximately one month.  The patient initially developed loose watery diarrhea.  He was admitted to the hospital approximately 10 days ago for a 3-day stay.  At that time a Campylobacter antigen was positive.  He was treated with ciprofloxacin and discharged.  He continued ciprofloxacin until approximately several days ago.  After stopping, his diarrhea recurred.  As of yesterday he had had approximately 14 diarrheal stools.  He has had some chills and sweats, but no fever.  He has had some abdominal discomfort that has not been better with moving his bowels.  He has also had some nausea, but no vomiting.  During his prior episode earlier he did have an episode of vomiting.  He has had no blood in the stool.  He denies any rashes.  He has not traveled anywhere recently.  Does not have any sick contacts.  His Clostridium difficile PCR was negative and his comprehensive stool culture is demonstrating positive Campylobacter antigen.   ALLERGIES:  None.   PAST MEDICAL HISTORY: 1.  Diabetes.  2.  Hypertension.  3.  Hypercholesterolemia.  4.  DVT.  5.  PE. 6.  Atrial fibrillation status post cardioversion.  7.  Chronic back pain status post multiple back surgeries.  8.  Neuropathy related to his diabetes.   FAMILY HISTORY:  Renal cancer in his mother, breast cancer in his sister and lung cancer in his brother.   SOCIAL HISTORY:  The patient lives with his wife.  He is a prior smoker having quit several decades ago.  He does not drink having quit several decades ago.  No injecting drug use.  He has a puppy at home.   He does not have any bird contact of which he is aware.   REVIEW OF SYSTEMS:  GENERAL:  No fevers.  Positive chills.  Positive sweats.  Positive malaise.  Positive generalized weakness.  HEENT:  No headaches.  No sinus congestion.  No sore throat.  NECK:  No stiffness.  No swollen glands.  RESPIRATORY:  No cough.  No shortness of breath.  No sputum production.  CARDIAC:  No chest pains or palpitations.  GASTROINTESTINAL:  Some nausea.  No current vomiting.  Some abdominal pain.  No significant cramping.  Positive diarrhea up to 14 times per day yesterday with loose, watery stool.  No blood or pus in the stool.  GENITOURINARY:  No complaints.  MUSCULOSKELETAL:  No complaints.  SKIN:  No rashes.  NEUROLOGIC:  No focal weakness.  PSYCHIATRIC:  No complaints.  All other systems are negative.   PHYSICAL EXAMINATION: VITAL SIGNS:  T-max of 98.2, pulse 73, blood pressure 147/62, 97% on room air.  GENERAL:  A 64 year old white male in no acute distress.  HEENT:  Normocephalic, atraumatic.  Pupils equal, reactive to light.  Extraocular motion intact.  Sclerae, conjunctivae, and lids are without evidence for emboli or petechiae.  Oropharynx shows no erythema or exudate.  Teeth and gums are in fair condition.  NECK:  Supple.  Full range of motion.  Midline trachea.  No lymphadenopathy.  No thyromegaly.  CHEST:  Clear to  auscultation bilaterally with good air movement.  No focal consolidation.  CARDIAC:  Regular rate and rhythm without murmur, rub, or gallop.  ABDOMEN:  Soft, mildly tender throughout, but no focal rebound or guarding.  No hepatosplenomegaly.  No hernia is noted.  EXTREMITIES:  No evidence for tenosynovitis.  SKIN:  No rashes.  No stigmata of endocarditis, specifically, no Janeway lesions or Osler nodes.  NEUROLOGIC:  The patient was awake and interactive, moving all four extremities.  PSYCHIATRIC:  Mood and affect appeared normal.   LABORATORY DATA:  BUN of 11, creatinine of 0.98,  bicarbonate of 30, anion gap of 3.  LFTs were unremarkable.  White count of 12.2 with a hemoglobin 14.6, platelet count of 203.  Clostridium difficile PCR is negative.  Comprehensive stool is pending, but a Campylobacter antigen is positive.  Urinalysis has greater than 500 glucose, but otherwise unremarkable.  An upper GI with barium swallow from April 16th shows findings suggestive of gastritis.   IMPRESSION:  A 64 year old male with a history of diabetes, deep vein thrombosis and pulmonary embolus was admitted with possible recurrent Campylobacter diarrhea and dehydration.   RECOMMENDATIONS: 1.  He has been treated for Campylobacter during a hospitalization about 10 days ago.  He initially improved, but a few days after stopping Cipro his diarrhea returned.  His Campylobacter antigen remains positive.  2.  Often Campylobacter can be found for quite a while in the stool after an infection.  Were he not symptomatic, I would not be concerned about this finding.  The recurrence of his symptoms makes treatment failure a possibility.  There has been increasing resistance to fluoroquinolones in Campylobacter over the past several years.  The first line of therapy is now recommended to be azithromycin.  3.  Would give azithromycin 500 mg for 3 to 5 days.  4.  If he cannot tolerate by mouth then we would use IV.  5.  There is no need for isolation.  6.  It is possible that he has another pathogen in his stool.  We will send for Giardia EIA as this will be another common reason for diarrhea.   This is a moderately complex infectious disease case.  Thank you very much for involving me in the patient's care.     ____________________________ Heinz Knuckles. Belle Charlie, MD meb:ea D: 04/12/2013 16:46:07 ET T: 04/12/2013 17:04:58 ET JOB#: [id number]  cc: Heinz Knuckles. Elide Stalzer, MD, <Dictator> Tawfiq Favila E Shelia Kingsberry MD ELECTRONICALLY SIGNED 04/14/2013 16:46

## 2015-04-18 NOTE — H&P (Signed)
PATIENT NAME:  Jerry Parsons, Jerry Parsons MR#:  [id number] DATE OF BIRTH:  03/17/1951  DATE OF ADMISSION:  04/02/2013  PRIMARY CARE PHYSICIAN: Rosealyn Little L. [name], MD  CARDIOLOGIST: Minna Merritts, MD  CHIEF COMPLAINT: Sweating, nausea, vomiting, diarrhea.   HISTORY OF PRESENT ILLNESS: This is a 64 year old man who is coming in with these sweating episodes, burning up. He has had some nausea and vomiting. He has also had diarrhea going on for four weeks. The diarrhea comes and goes. Yesterday, he had diarrhea 10 episodes, no blood yesterday, but he has seen blood in the past with his hemorrhoids. He has been taking Imodium, popping them like candy. He took four Imodium yesterday. He has no appetite, no recent antibiotics, no travel out of the country and no sick contacts. He does have abdominal pain in his lower abdomen bilaterally 5 out of 10 in intensity, very sharp in nature, slightly better after having diarrhea. The patient had an elevated white blood cell count. Hospitalist services were contacted for further evaluation.   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   PAST SURGICAL HISTORY: Five lower back surgeries, three neck surgeries, rotator cuff surgery, cholecystectomy, right knee arthroscopy.   ALLERGIES: No known drug allergies.   MEDICATIONS: Include ACTOplus MET 15/850 one tablet twice a day, amiodarone 200 mg twice a day, aspirin 325 mg daily, Avodart 0.5 mg daily, cyclobenzaprine 10 mg 3 times a day, diazepam 2 mg 1 tablet 3 times a day, Allegra 180 mg daily, gabapentin 300 mg 3 times a day, glimepiride 2 mg daily, hydrochlorothiazide/losartan 25/100 one tablet daily, Invokana 100 mg daily, Januvia 100 mg daily, Lasix 20  mg daily, Victoza 1.2 mg subcutaneous injection daily, metoprolol 50 mg twice a day, Nexium 40 mg daily, oxycodone 10 mg every 3 hours as needed for pain, Pradaxa 150 mg twice a day, ranitidine 300 mg daily, Lantus 20 units subcutaneous injection nightly.  SOCIAL HISTORY: He quit smoking in 1991. No alcohol. No drug use. Retired, on [place], used to be in [employer] fund[employer].   FAMILY HISTORY: Mother died of kidney cancer. Father died age 63, unknown cause.   REVIEW OF SYSTEMS: CONSTITUTIONAL: Positive for chills. Positive for sweats. No fever. Positive for weight loss, down from 289 down to 251. He is trying. Positive for fatigue.  EYES: No visual changes.  EARS, NOSE, MOUTH, AND THROAT: Positive for dysphagia to solids and liquids. No sore throat. No hearing loss.  CARDIOVASCULAR: No chest pain. No palpitations.  RESPIRATORY: No shortness of breath. No cough. No sputum. No hemoptysis.  GASTROINTESTINAL: Positive for nausea and vomiting. Positive for abdominal pain. Positive for diarrhea. Occasional bright red blood per rectum.  GENITOURINARY: Strong urine. No burning on urination.  MUSCULOSKELETAL: No joint pain.  INTEGUMENT: No rashes or ulcers seen.  NEUROLOGICAL: No fainting or blackouts.  PSYCHIATRIC: No anxiety or depression.  ENDOCRINE: No thyroid problems.  HEMATOLOGIC AND LYMPHATIC: No anemia.   PHYSICAL EXAMINATION:  VITAL SIGNS: Temperature 99.4, pulse 92, respirations 20, blood pressure 118/64, pulse ox 94% on oxygen. We also have one 95% on room air.  GENERAL: No respiratory distress, lying flat in bed.  EYES: Conjunctivae and lids normal. Pupils equal, round, and reactive to light. Extraocular muscles intact.  EARS, NOSE, MOUTH, AND THROAT: Tympanic membranes:  No erythema. Nasal mucosa: No erythema. Throat: No erythema, no exudate seen. Lips and gums: No lesions.  NECK: No JVD. No bruits. No lymphadenopathy. No thyromegaly. No thyroid nodules palpated.  LUNGS: Clear to auscultation.  No use of accessory muscles to breathe. No rhonchi, rales, or wheeze heard.  CARDIOVASCULAR: S1, S2 normal. No gallops, rubs or murmurs heard. Carotid upstroke 2+ bilaterally. No bruits. Dorsalis pedis pulses 2+ bilaterally. No edema of the lower extremities.  ABDOMEN: Soft. Positive tenderness bilateral lower quadrants, left and right lower quadrant. No organo- or splenomegaly. Normoactive bowel sounds. No masses felt.  LYMPHATIC: No lymph node in the neck.  MUSCULOSKELETAL: No clubbing, edema, or cyanosis.  SKIN: No rashes or ulcers seen.  NEUROLOGICAL: Cranial nerves II through XII grossly intact. Deep tendon reflexes 1+ bilateral lower extremities.  PSYCHIATRIC: The patient is oriented to person, place, and time.   LABORATORY AND RADIOLOGICAL DATA: CPK 121. Troponin negative. INR 1.2. Lipase 69. Glucose 210, BUN 21, creatinine 1.26, sodium 135, potassium 4.1, chloride 98, CO2 of 30, calcium 9.9. Liver function tests: AST slightly elevated at 41. Other liver function tests normal range. White blood cell count 17.5, H and H 17.1 and 52, platelet count of 205. Chest x-ray showed no acute cardiopulmonary disease. CT scan of the abdomen and pelvis without contrast showed no evidence of bowel obstruction or ileus. No evidence of enteritis or colitis. No free air or fluid. No obstruction in the kidneys. Gallbladder surgically absent. EKG showed a normal sinus rhythm 96 beats per minute, left atrial enlargement, incomplete right bundle branch block, nonspecific ST-T wave changes.   ASSESSMENT AND PLAN:  1. Systemic inflammatory response syndrome, abdominal pain, infectious diarrhea, nausea and vomiting. Will send off stool studies, IV and p.o. pain medications, IV nausea medications, IV fluid hydration. Since the diarrhea is going on for over four weeks, will empirically put on Cipro and Flagyl at this point. If no improvement, may need a GI consultation. Patient was going to be set up as outpatient anyway  for colonoscopy in May. I did not order any Imodium at this point pending stool studies results. The patient may not have diarrhea today since he did take four Imodium yesterday.  2. Atrial fibrillation history. The patient is in normal sinus rhythm after cardioversion. He is on twice a day metoprolol, amiodarone and anticoagulation is with Pradaxa.  3. Hypertension. Blood pressure on the lower side today as per family. Will give IV fluid hydration, continue meds at this point.  4. Diabetes. Will stop Glucophage with diarrhea. Will hold some of his diabetic medications. The patient does take Lantus. I will give half dose Lantus, a little less than half dose Lantus, 8 units at bedtime.  5. Chronic pain and neuropathy. Continue his usual pain regimen orally.  6. Gastroesophageal reflux disease. The patient is on Nexium and ranitidine. Nexium could be another culprit for diarrhea but will continue this for right now since the patient has been on this for a long time.  7. Sleep apnea. The patient is unable to tolerate CPAP.  8. History of DVT and PE, already on Pradaxa for the atrial fibrillation.  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 I advised him not to eat ice. 10. Hyperlipidemia. The patient is not on any medications for this at this point.  Time spent on admission: 50 minutes.    ____________________________ Tana Conch. Leslye Peer, MD rjw:es D: 04/02/2013 14:41:55 ET T: 04/02/2013 15:21:02 ET JOB#: [id number]  cc: Tana Conch. [place],  MD, <Dictator> Deep Bon[place]itz L. [name], MD Minna Merritts, MD Marisue Brooklyn MD ELECTRONICALLY SIGNED 04/03/2013 13:50

## 2015-04-18 NOTE — H&P (Signed)
PATIENT NAME:  Jerry Parsons, Jerry Parsons MR#:  [id number] DATE OF BIRTH:  03/16/1951  DATE OF ADMISSION:  04/12/2013  PRIMARY CARE PHYSICIAN: Dr. Miguel Aschoff.   CHIEF COMPLAINT: Diarrhea and dizziness.   HISTORY OF PRESENT ILLNESS: This is a 64 year old male who presents to the hospital with ongoing diarrhea and dizziness. The patient was in the hospital about a week or so ago with acute diarrhea secondary to Campylobacter, discharged on p.o. ciprofloxacin. He finished his p.o. Cipro on Monday, and for the past two days, he has been having worse diarrhea. He went about 14 times yesterday. The stool was watery in nature. The patient has also had poor p.o. intake and persistent nausea. He went to see his GI doctor, Dr. Vira Agar, who referred him to be admitted from the emergency room. The patient still continues to complain of some dizziness. He also complains of some left lower quadrant abdominal pain, but no other associated symptoms presently. His antigen is still positive for Campylobacter presently when checked yesterday.   The patient denies any chest pain, shortness of breath, headache or any other associated symptoms presently.   REVIEW OF SYSTEMS:  CONSTITUTIONAL: No documented fever. No weight gain or weight loss.  EYES: No blurry or double vision.  ENT: No tinnitus. No postnasal drip. No redness of the oropharynx.  RESPIRATORY: No cough. No wheeze. No hemoptysis. No dyspnea.  CARDIOVASCULAR: No chest pain. No orthopnea. No palpitations. No syncope.  GASTROINTESTINAL: Positive nausea. No vomiting. Positive diarrhea. No abdominal pain. No melena or hematochezia.  GENITOURINARY: No dysuria. No hematuria.  ENDOCRINE: No polyuria or nocturia. No heat or cold intolerance.  HEMATOLOGIC: No anemia, no bruising, no bleeding.  INTEGUMENTARY: No rashes. No lesions.  MUSCULOSKELETAL: No arthritis. No swelling. No gout.  NEUROLOGIC: No numbness or tingling. No ataxia. No seizure-type activity.   PSYCHIATRIC: No anxiety. No insomnia. No ADD.   PAST MEDICAL HISTORY: Consistent with diabetes, hypertension, hyperlipidemia history of recent Campylobacter diarrhea, chronic atrial fibrillation, chronic back pain, diabetic neuropathy.   ALLERGIES: No known drug allergies.   SOCIAL HISTORY: No smoking. No alcohol abuse. No illicit drug abuse. Lives at home with his wife.   FAMILY HISTORY: Mother died from complications of kidney cancer. He cannot recall what his father died from.   CURRENT MEDICATIONS: Amiodarone 200 mg b.i.d., amitriptyline 25 mg at bedtime, aspirin 325 mg daily, Avodart 0.5 mg daily, Flexeril 10 mg b.i.d., Valium 2 mg t.i.d., Allegra 180 mg daily, fish oil 1 tab b.i.d., Florastor 1 tab daily, gabapentin 800 mg t.i.d., glimepiride 2 mg daily, hydrochlorothiazide 12.5 mg daily, Lantus 20 units daily, metformin 1,000 mg b.i.d., metoprolol tartrate 50 mg b.i.d., Nexium 40 mg daily, OxyContin 40 mg t.i.d., Pradaxa 150 mg b.i.d., Pravachol 10 mg at bedtime, ranitidine 300 mg daily and Rhinocort one spray daily as needed.   PHYSICAL EXAMINATION ON ADMISSION: As follows: VITAL SIGNS: Noted to be temperature 97.8, pulse 65, respirations 20, blood pressure 142/66, sats 95% on room air.  GENERALLY: The patient is a pleasant appearing male in no apparent distress.  HEAD, EYES, EARS, NOSE, AND THROAT: Atraumatic, normocephalic. Extraocular muscles are intact. Pupils equal and reactive to light. Sclerae anicteric. No conjunctival injection. No pharyngeal erythema.  NECK: Supple. There is no jugular venous distention. No bruits, no lymphadenopathy, no thyromegaly.  HEART: Regular rate and rhythm. No murmurs, no rubs, no clicks.  LUNGS: Clear to auscultation bilaterally. No rales or rhonchi. No wheezes.  ABDOMEN: Soft, flat, tender in the left lower  quadrant. No rebound, no rigidity. Hypoactive bowel sounds. No hepatosplenomegaly appreciated.  EXTREMITIES: No evidence of any cyanosis,  clubbing or peripheral edema. Has +2 pedal and radial pulses bilaterally.  NEUROLOGICALLY: The patient is alert, awake, oriented x 3 with no focal motor or sensory deficits appreciated bilaterally.  SKIN: Moist and warm, with no rash appreciated.  LYMPHATIC: There is no cervical or axillary lymphadenopathy.   LABORATORY EXAM: Serum glucose of 146, BUN 11, creatinine 0.9, sodium 137, potassium 4, chloride 104, bicarb 30. The patient's LFTs are within normal limits. Troponin less than 0.02. White cell count 12.2, hemoglobin 14.6, hematocrit 45.6 and platelet count 203. Stool is positive for Campylobacter, negative for C. difficile. Urinalysis within normal limits.   ASSESSMENT AND PLAN: This is a 64 year old male with a history of diabetes, hypertension, chronic back pain, atrial fibrillation, diabetic neuropathy, benign prostatic hypertrophy, gastroesophageal reflux disease and hyperlipidemia who was recently diagnosed with Campylobacter diarrhea, who presents back to the hospital due to dizziness and having ongoing diarrhea despite finishing p.o. Ciprofloxacin for his diarrhea.   1. Acute diarrhea: This was likely secondary to recurrent Campylobacter. The patient's antigen is still positive. The patient has finished treatment with p.o. ciprofloxacin for 8 days and finished 2 days ago. I will start the patient on p.o. Zithromax for now, continue supportive care with IV fluids, antiemetics. Will get an Infectious Disease consult. I discussed the case with Dr. Clayborn Bigness who will see the patient.  2. Dizziness: I suspect this is secondary to the diarrhea and volume loss. Hydrate him with IV fluids and follow him clinically. I will go ahead and check orthostatics. 3. Chronic atrial fibrillation: The patient is currently rate controlled and is in normal sinus rhythm. He was recently cardioverted a couple months back. For now, will continue his amiodarone and metoprolol, continue his Pradaxa.  4. Chronic back  pain: Continue with OxyContin, continue with  Valium, continue with Flexeril.  5. Diabetes: Since the patient is going to be a clear liquid diet, I will hold his Lantus, metformin and glimepiride, place him on sliding scale insulin for now.  6. Diabetic neuropathy: Continue Neurontin.  7. Hyperlipidemia: Continue Pravachol. 8. BPH: Continue Avodart. 9. GERD: Continue Nexium.   CODE STATUS: The patient is a full code.   Time spent on admission is 50 minutes.     ____________________________ Belia Heman. Verdell Carmine, MD vjs:es D: 04/12/2013 15:00:18 ET T: 04/12/2013 15:31:28 ET JOB#: [id number]  cc: Belia Heman. Verdell Carmine, MD, <Dictator> Henreitta Leber MD ELECTRONICALLY SIGNED 04/12/2013 21:42

## 2015-04-18 NOTE — Consult Note (Signed)
Impression:    64yo male w/ h/o DM, DVT, PE admitted with possible recurrent Campylobacter diarrhea and dehydration.     He has been treated for Campylobacter during a hospitalization about 10 days ago.  He initially improved, but a few days after stopping cipro, his diarrhea returned. His Campy Ag remains positive.    Often Campylobacter can be found for quite a while in the stool after an infection.  Were he not symptomatic, I would not be concerned.  The recurrence of his symptoms makes treatment failure a possibility.  There has been increasing resistance to fluoroquinolones in Campylobacter over the past several years.  The first line therapy is now recommended to be azithromycin.    Would give azithro 500mg  for 3-5 days.      If he cannot tolerate PO, would use IV.Would start with IV, but change to po once his diarrhea improves.    No need for isolation.   Electronic Signatures: Kierstynn Babich MPH, Heinz Knuckles (MD) (Signed on 17-Apr-14 16:38)  Authored   Last Updated: 17-Apr-14 16:40 by Irina Okelly MPH, Heinz Knuckles (MD)

## 2015-04-18 NOTE — Discharge Summary (Signed)
PATIENT NAME:  Jerry Parsons, Jerry Parsons MR#:  [id number] DATE OF BIRTH:  01-31-1951  DATE OF ADMISSION:  04/02/2013 DATE OF DISCHARGE:  04/04/2013  DISCHARGE DIAGNOSES:  Abdominal pain and diarrhea, campylobacter jejuni, atrial fibrillation, history of deep vein thrombosis, diabetes, hypertension, chronic pain syndrome, gastroesophageal reflux disease.   CONDITION OND DISCHARGE:  Stable.   CODE STATUS:  FULL CODE.    MEDICATION ON DISCHARGE:  Aspirin 325 mg delayed release tablet once a day,  amiodarone 200 mg oral tablet 2 times a day, Avodart 0.5 mg oral capsule once a day, diazepam 2 mg oral tablet 3 times a day, fexofenadine 180 mg oral tablet once a day, glimepiride 2 mg oral tablet once a day, ranitidine 300 mg oral tablet once a day, Nexium 40 mg oral delayed-release capsule once a day, metoprolol tartrate 50 mg oral tablet 2 times a day, amitriptyline 25 mg oral tablet once a day, cyclobenzaprine 10 mg oral tablet, Rhinocort 32 mcg inhaled spray once a day as needed, pravastatin 10 mg oral tablet once a day, OxyContin 40 mg oral tablet extended release 3 times a day, gabapentin 800 mg oral tablet 3 times a day, metformin 1000 mg oral tablet 2 times a day, Lantus 20 units subcutaneous once a day, hydrochlorothiazide 12.5 mg oral tablet once a day, fish oil 1200 mg oral capsule 2 times a day, ciprofloxacin 500 mg oral tablet every 12 hours for 4 days.   HOME HEALTH:  No.   DIET ON DISCHARGE:  Low sodium, low fat, low cholesterol, carbohydrate-controlled diet.  Diet consistency regular.   ACTIVITY:  As tolerated.   TIMEFRAME TO FOLLOW-UP:  Within 2 to 4 weeks with Dr. Vira Agar.  Hold amiodarone for the next 3 to 4 days, still taking ciprofloxacin, then restart the same dose.     HISTORY OF PRESENT ILLNESS:  This is a 64 year old male who is coming in with this sweating episode, burning up.  He has had some nausea and vomiting.  He has also had diarrhea going on for four weeks.  Diarrhea was on and off.   The day before presentation he had diarrhea almost 10 episodes.  There was no blood, but he had seen some blood in the past, in the past few weeks with hemorrhoids.  He has been taking Imodium.  He took like 4 to 5 Imodium the day before presentation.  He did not have any appetite, recent antibiotics or any travel history and did not have any sick contacts either.  Did have some abdominal pain in the lower abdominal quadrants bilaterally 5 out of 10 in intensity, very sharp in nature, slightly better after having diarrhea.  He had elevated white cell count.   HOSPITAL COURSE AND STAY:  Systemic inflammatory response syndrome secondary to diarrhea, possibly infectious.  Sent stool studies and he was maintained on IV pain medication and IV nausea medication and IV fluid hydration.  He was started on IV Cipro and Flagyl for possible diverticulitis.  He felt significantly better within the next two days.  His nausea and vomiting totally stopped.  There were no more episodes of diarrhea, no fever.  His sweating and abdominal pain decreased significantly and was able to tolerate diet, and so we discharged him home.  His stool culture came positive for campylobacter jejuni and I discharged him on continued ciprofloxacin for the next 4 days and advised him to keep his appointment with Dr. Vira Agar as he already had after 3 to 4 weeks to go  for colonoscopy as a routine screening.   OTHER MEDICAL ISSUES:  1.  Atrial fibrillation history.  The patient was in normal sinus rhythm while he was in the hospital, continued on metoprolol, amiodarone and anticoagulation with Pradaxa.  2.  Hypertension.  Blood pressure was slightly on the lower side on presentation.  We gave some hydration and after that it came up.  Continued medication as usual.  3.  Diabetes.  We stopped Glucophage with diarrhea and gave decreased dose of Lantus initially, then after he improved advised to restart and continue on discharge.  4.  Chronic pain  and neuropathy.  Continued his usual pain medications.  5.  Gastroesophageal reflux disease, was taking Nexium and ranitidine.  We continued that.  6.  Sleep apnea.  He was unable to tolerate CPAP.  7.  History of DVT and PE. Was already on Pradaxa for atrial fibrillation.  8.  Hyperlipidemia, was not taking any medication.   IMPORTANT LABORATORY RESULTS IN THE HOSPITAL:  BUN was 21 and creatinine was 1.26 on presentation, remained stable in the hospital.  Troponin was negative.  LFTs were normal.  WBC count was 17,500, which came down to 11,900 on follow-up.  Blood cultures were negative.  Urine cultures were 80,000 colony-forming units of streptococcus agalactiae.  Stool occult blood was negative.  Stool culture was positive for campylobacter jejuni.  CT of the abdomen and pelvis, no evidence of bowel obstruction or ileus.  No evidence of enteritis or colitis.  No intra-abdominal or pelvic mass or abscess, nor free or contained fluid collection.  No evidence of obstruction of either kidney.  No findings to suggest pyelonephritis.  Gallbladder is surgically absent.  No evidence of acute hepatobiliary abnormalities.   TOTAL TIME SPENT IN THIS DISCHARGE:  45 minutes.     ____________________________ Ceasar Lund Anselm Jungling, MD vgv:ea D: 04/07/2013 15:29:57 ET T: 04/08/2013 04:46:32 ET JOB#: [id number]  cc: Ceasar Lund. Anselm Jungling, MD, <Dictator> Richard L. [name], MD Dr. Vira Agar, [facility]   Vaughan Basta MD ELECTRONICALLY SIGNED 04/08/2013 22:25

## 2015-04-18 NOTE — H&P (Signed)
  DATE OF BIRTH:  Dec 13, 1951  DATE OF ADMISSION:  04/02/2013  ADDENDUM The medication list was marked as complete in the computer when I saw the patient earlier. When the patient arrived up to the floor, medications were rechecked, and this is an addendum on an updated medication list.  Amiodarone 200 mg twice a day, amitriptyline 25 mg at bedtime, aspirin 325 mg daily, Avodart 0.5 mg daily, Centrum multivitamin with minerals 1 tablet daily, cyclobenzaprine 10 mg at 8:00 a.m. and 12:00 noon, diazepam 2 mg 3 times a day, Allegra 180 mg daily, fish oil 1200 mg 1 capsule twice a day, gabapentin 800 mg 3 times a day, glimepiride 2 mg daily, hydrochlorothiazide 12.5 mg daily, Invokana 100 mg daily, Januvia 100 mg daily, Lantus 20 units subcutaneous injection daily, Lasix 20 mg daily, Victoza 1.2 mg subcutaneous injection daily, metformin 1000 mg b.i.d., metoprolol tartrate 50 mg twice a day, Nexium 40 mg daily, OxyContin 40 mg 3 times a day, Pradaxa 150 mg twice a day, Pravastatin 10 mg at bedtime, ranitidine 300 mg daily, Rhinocort 32 mcg per inhalation spray, 1 spray each nostril daily as needed for nasal congestion.     ____________________________ Tana Conch. Leslye Peer, MD rjw:mr D: 04/02/2013 19:04:00 ET T: 04/02/2013 19:50:35 ET JOB#: [id number]  cc: Tana Conch. Leslye Peer, MD, <Dictator> Marisue Brooklyn MD ELECTRONICALLY SIGNED 04/03/2013 13:52

## 2015-05-13 DIAGNOSIS — E1122 Type 2 diabetes mellitus with diabetic chronic kidney disease: Secondary | ICD-10-CM | POA: Diagnosis not present

## 2015-05-13 DIAGNOSIS — M1990 Unspecified osteoarthritis, unspecified site: Secondary | ICD-10-CM | POA: Diagnosis not present

## 2015-05-13 DIAGNOSIS — E785 Hyperlipidemia, unspecified: Secondary | ICD-10-CM | POA: Diagnosis not present

## 2015-05-13 DIAGNOSIS — I2510 Atherosclerotic heart disease of native coronary artery without angina pectoris: Secondary | ICD-10-CM | POA: Diagnosis not present

## 2015-05-14 DIAGNOSIS — M4806 Spinal stenosis, lumbar region: Secondary | ICD-10-CM | POA: Diagnosis not present

## 2015-05-14 DIAGNOSIS — M47816 Spondylosis without myelopathy or radiculopathy, lumbar region: Secondary | ICD-10-CM | POA: Diagnosis not present

## 2015-05-28 DIAGNOSIS — M1710 Unilateral primary osteoarthritis, unspecified knee: Secondary | ICD-10-CM | POA: Insufficient documentation

## 2015-05-28 DIAGNOSIS — M25562 Pain in left knee: Secondary | ICD-10-CM | POA: Diagnosis not present

## 2015-05-28 DIAGNOSIS — M179 Osteoarthritis of knee, unspecified: Secondary | ICD-10-CM | POA: Insufficient documentation

## 2015-05-28 DIAGNOSIS — M1712 Unilateral primary osteoarthritis, left knee: Secondary | ICD-10-CM | POA: Diagnosis not present

## 2015-05-28 DIAGNOSIS — M1711 Unilateral primary osteoarthritis, right knee: Secondary | ICD-10-CM | POA: Diagnosis not present

## 2015-05-30 DIAGNOSIS — H26499 Other secondary cataract, unspecified eye: Secondary | ICD-10-CM | POA: Diagnosis not present

## 2015-06-03 DIAGNOSIS — M25562 Pain in left knee: Secondary | ICD-10-CM | POA: Diagnosis not present

## 2015-06-03 DIAGNOSIS — S83242A Other tear of medial meniscus, current injury, left knee, initial encounter: Secondary | ICD-10-CM | POA: Diagnosis not present

## 2015-06-16 ENCOUNTER — Other Ambulatory Visit: Payer: Self-pay | Admitting: Family Medicine

## 2015-06-23 ENCOUNTER — Other Ambulatory Visit: Payer: Self-pay

## 2015-06-26 DIAGNOSIS — E291 Testicular hypofunction: Secondary | ICD-10-CM | POA: Diagnosis not present

## 2015-07-04 DIAGNOSIS — R6510 Systemic inflammatory response syndrome (SIRS) of non-infectious origin without acute organ dysfunction: Secondary | ICD-10-CM | POA: Diagnosis not present

## 2015-07-04 DIAGNOSIS — A419 Sepsis, unspecified organism: Secondary | ICD-10-CM | POA: Diagnosis not present

## 2015-07-04 DIAGNOSIS — I480 Paroxysmal atrial fibrillation: Secondary | ICD-10-CM | POA: Insufficient documentation

## 2015-07-04 DIAGNOSIS — Z7951 Long term (current) use of inhaled steroids: Secondary | ICD-10-CM | POA: Diagnosis not present

## 2015-07-04 DIAGNOSIS — J690 Pneumonitis due to inhalation of food and vomit: Secondary | ICD-10-CM | POA: Diagnosis not present

## 2015-07-04 DIAGNOSIS — G8929 Other chronic pain: Secondary | ICD-10-CM | POA: Diagnosis not present

## 2015-07-04 DIAGNOSIS — E1040 Type 1 diabetes mellitus with diabetic neuropathy, unspecified: Secondary | ICD-10-CM | POA: Diagnosis not present

## 2015-07-04 DIAGNOSIS — R0902 Hypoxemia: Secondary | ICD-10-CM | POA: Diagnosis not present

## 2015-07-04 DIAGNOSIS — R918 Other nonspecific abnormal finding of lung field: Secondary | ICD-10-CM | POA: Diagnosis not present

## 2015-07-04 DIAGNOSIS — R509 Fever, unspecified: Secondary | ICD-10-CM | POA: Diagnosis not present

## 2015-07-04 DIAGNOSIS — Z79899 Other long term (current) drug therapy: Secondary | ICD-10-CM | POA: Diagnosis not present

## 2015-07-04 DIAGNOSIS — Z7982 Long term (current) use of aspirin: Secondary | ICD-10-CM | POA: Diagnosis not present

## 2015-07-04 DIAGNOSIS — R05 Cough: Secondary | ICD-10-CM | POA: Diagnosis not present

## 2015-07-04 DIAGNOSIS — K219 Gastro-esophageal reflux disease without esophagitis: Secondary | ICD-10-CM | POA: Diagnosis present

## 2015-07-04 DIAGNOSIS — Z794 Long term (current) use of insulin: Secondary | ICD-10-CM | POA: Diagnosis not present

## 2015-07-04 DIAGNOSIS — Z888 Allergy status to other drugs, medicaments and biological substances status: Secondary | ICD-10-CM | POA: Diagnosis not present

## 2015-07-04 DIAGNOSIS — M549 Dorsalgia, unspecified: Secondary | ICD-10-CM | POA: Diagnosis present

## 2015-07-07 ENCOUNTER — Other Ambulatory Visit: Payer: Self-pay

## 2015-07-07 MED ORDER — METOPROLOL TARTRATE 50 MG PO TABS: 50.0000 mg | ORAL_TABLET | Freq: Two times a day (BID) | ORAL | Status: DC

## 2015-07-07 NOTE — Telephone Encounter (Signed)
Refill sent for metoprolol tart  

## 2015-07-11 ENCOUNTER — Encounter: Payer: Self-pay | Admitting: Family Medicine

## 2015-07-11 ENCOUNTER — Ambulatory Visit (INDEPENDENT_AMBULATORY_CARE_PROVIDER_SITE_OTHER): Payer: Medicare Other | Admitting: Family Medicine

## 2015-07-11 VITALS — BP 108/60 | HR 72 | Temp 98.1°F | Resp 16 | Ht 71.0 in | Wt 250.0 lb

## 2015-07-11 DIAGNOSIS — M549 Dorsalgia, unspecified: Secondary | ICD-10-CM | POA: Diagnosis not present

## 2015-07-11 DIAGNOSIS — M170 Bilateral primary osteoarthritis of knee: Secondary | ICD-10-CM | POA: Diagnosis not present

## 2015-07-11 DIAGNOSIS — G8929 Other chronic pain: Secondary | ICD-10-CM

## 2015-07-11 MED ORDER — OXYCODONE HCL 20 MG PO TABS: 1.0000 | ORAL_TABLET | ORAL | Status: DC | PRN

## 2015-07-11 MED ORDER — OXYCODONE HCL ER 40 MG PO T12A: 40.0000 mg | EXTENDED_RELEASE_TABLET | ORAL | Status: DC | PRN

## 2015-07-11 NOTE — Progress Notes (Signed)
Subjective:    Patient ID: Jerry Parsons, male    DOB: 1951-07-31, 64 y.o.   MRN: [id number]   Follow up Hospitalization  Patient was admitted to [facility] on 07/04/2015 and discharged on 07/06/2015. He was treated for pneumonia. Treatment for this included Augmentin, O2 therapy, and nebulizer treatments. He reports poor compliance with treatment (Pt had allergic reaction to Augmentin; mouth sores). He reports this condition is waxing and waning.  ------------------------------------------------------------------------------------   Pneumonia There is no chest tightness, cough, difficulty breathing, frequent throat clearing, hemoptysis, hoarse voice, shortness of breath, sputum production or wheezing. The current episode started in the past 7 days. The problem has been waxing and waning (Pt could not finish Augmentin due to mouth sores). Associated symptoms include malaise/fatigue, myalgias, postnasal drip, sweats and trouble swallowing. Pertinent negatives include no appetite change, chest pain, dyspnea on exertion, ear congestion, ear pain, fever, headaches, heartburn, nasal congestion, orthopnea, rhinorrhea, sneezing, sore throat or weight loss.      Review of Systems  Constitutional: Positive for malaise/fatigue. Negative for fever, weight loss and appetite change.  HENT: Positive for postnasal drip and trouble swallowing. Negative for ear pain, hoarse voice, rhinorrhea, sneezing and sore throat.   Eyes: Negative.   Respiratory: Negative for cough, hemoptysis, sputum production, shortness of breath and wheezing.   Cardiovascular: Negative for chest pain and dyspnea on exertion.  Gastrointestinal: Negative for heartburn.  Endocrine: Negative.   Musculoskeletal: Positive for myalgias and back pain.       Back pain is chronic but knee pain is a lot worse recently. He wants to go in and have surgery. The lady needs knee replacement on the right and  arthroscopy to clean out the Meniscus on the left.  Neurological: Negative for headaches.  Hematological: Negative.   Psychiatric/Behavioral: Negative.    BP 108/60 mmHg  Pulse 72  Temp(Src) 98.1 F (36.7 C) (Oral)  Resp 16  Ht 5\' 11"  (1.803 m)  Wt 250 lb (113.399 kg)  BMI 34.88 kg/m2  SpO2 94%   Patient Active Problem List   Diagnosis Date Noted  . AF (paroxysmal atrial fibrillation) 07/04/2015  . Aspiration pneumonia 07/04/2015  . Arthritis of knee, degenerative 05/28/2015  . Chronic back pain 09/17/2014  . Leg edema 05/07/2014  . Chronic diastolic CHF (congestive heart failure) 05/07/2014  . Campylobacter diarrhea 04/25/2013  . Atrial flutter 01/23/2013  . Obesity 05/19/2012  . Hyperlipidemia 11/11/2011  . Diastolic dysfunction 63/78/5885  . SOB (shortness of breath) 04/12/2011  . Diabetes mellitus type 2, uncontrolled, with complications 02/77/4128  . HYPERTENSION, BENIGN 03/15/2011  . DVT 03/15/2011  . TACHYCARDIA 03/15/2011   Past Medical History  Diagnosis Date  . Hypertension   . Arrhythmia     tachycardia  . DVT (deep venous thrombosis) 02/2010  . Diabetes mellitus   . Pulmonary embolus   . Kidney failure   . Sleep apnea   . Neuropathy   . GERD (gastroesophageal reflux disease)   . Depression   . BPH (benign prostatic hyperplasia)   . 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     x2   Current Outpatient Prescriptions on File Prior to Visit  Medication Sig  . amiodarone (PACERONE) 200 MG tablet Take 1 tablet (200 mg total) by mouth daily.  [name] amitriptyline (ELAVIL) 25 MG tablet Take 25 mg by mouth at bedtime.  [name] aspirin 81 MG tablet Take 81 mg by mouth daily.   . budesonide (RHINOCORT AQUA) 32 MCG/ACT  nasal spray Place 1 spray into the nose daily as needed.   . cyclobenzaprine (FLEXERIL) 10 MG tablet Take 10 mg by mouth 2 (two) times daily.   . diazepam (VALIUM) 2 MG tablet Take 2 mg by mouth every 8 (eight) hours as needed.   . dutasteride (AVODART)  0.5 MG capsule Take 0.5 mg by mouth daily.    [name] esomeprazole (NEXIUM) 40 MG capsule Take 40 mg by mouth 2 (two) times daily.   . fexofenadine (ALLEGRA) 180 MG tablet Take 180 mg by mouth daily.    . furosemide (LASIX) 40 MG tablet TAKE 2 TABLETS BY MOUTH DAILY  . gabapentin (NEURONTIN) 800 MG tablet Take 800 mg by mouth 3 (three) times daily.   . hydrochlorothiazide (HYDRODIURIL) 25 MG tablet Take 1 tablet (25 mg total) by mouth daily. (Patient taking differently: Take 12.5 mg by mouth daily. )  . Insulin Glargine (LANTUS SOLOSTAR [place]) Inject 20 Units into the skin daily.   . Liraglutide (VICTOZA [place]) 1.2 once in the am.  . Liraglutide 18 MG/3ML SOPN Inject into the skin.  . metFORMIN (GLUCOPHAGE) 1000 MG tablet Take 1,000 mg by mouth 2 (two) times daily.  . metoprolol (LOPRESSOR) 50 MG tablet Take 1 tablet (50 mg total) by mouth 2 (two) times daily.  . Multiple Vitamins-Minerals (CENTRUM SILVER ULTRA MENS PO) Take by mouth daily.  . NON FORMULARY Depro shot one every 2 weeks  . Omega-3 Fatty Acids (FISH OIL) 1200 MG CAPS Take 1,200 mg by mouth 2 (two) times daily.  [name] oxyCODONE (OXYCONTIN) 40 MG 12 hr tablet Take 40 mg by mouth 3 (three) times daily.    . Oxycodone HCl 10 MG TABS Take 10 mg by mouth as needed.  . pravastatin (PRAVACHOL) 10 MG tablet Take 10 mg by mouth daily.  . ranitidine (ZANTAC) 300 MG tablet Take 300 mg by mouth at bedtime.    . sitaGLIPtan (JANUVIA) 100 MG tablet Take 100 mg by mouth daily.    [name] testosterone cypionate (DEPOTESTOTERONE CYPIONATE) 200 MG/ML injection    No current facility-administered medications on file prior to visit.   Allergies  Allergen Reactions  . Carisoprodol Other (See Comments)    Other reaction(s): Unknown Sensitivity    Past Surgical History  Procedure Laterality Date  . Gallbladder surgery  2002  . Rotator cuff repair  2001  . Back surgery      x 8; upper x 3 & lower x 5  . Cardioversion     History   Social History  . Marital  Status: Married    Spouse Name: N/A  . Number of Children: N/A  . Years of Education: N/A   Occupational History  . Not on file.   Social History Main Topics  . Smoking status: Former Smoker -- 1.00 packs/day for 25 years    Types: Cigarettes    Quit date: 12/26/1989  . Smokeless tobacco: Never Used  . Alcohol Use: No  . Drug Use: No  . Sexual Activity: Not on file   Other Topics Concern  . Not on file   Social History Narrative   Family History  Problem Relation Age of Onset  . Heart attack Brother        Objective:   Physical Exam  Constitutional: He is oriented to person, place, and time. He appears well-developed and well-nourished.  HENT:  Head: Normocephalic and atraumatic.  Right Ear: External ear normal.  Left Ear: External ear normal.  Nose: Nose normal.  Mouth/Throat: Oropharynx is clear and moist.  Eyes: Conjunctivae are normal.  Neck: Normal range of motion.  Cardiovascular: Normal rate, regular rhythm and normal heart sounds.   Pulmonary/Chest: Effort normal and breath sounds normal.  Room air oximetry 92% today. He was 82% when he went to the hospital with pneumonia a week ago.  Abdominal: Soft.  Neurological: He is alert and oriented to person, place, and time.  Skin: Skin is warm and dry.  Psychiatric: He has a normal mood and affect. His behavior is normal. Judgment and thought content normal.          Assessment & Plan:  Pneumonia Clinically patient has untreated sleep apnea and I think he is aspirating this is a cause of the pneumonia. This is one of the reasons I think we'll pursue treating the sleep apnea again. Follow-up chest x-ray in a month.  Known sleep apnea Sleep study done 2 years ago. Patient did not like wearing CPAP. We'll try to arrange for auto titrating CPAP.   Chronic back pain/DDD/OA of knee--refill his OxyContin but increased oxycodone for breakthrough pain from 10-20 mg. May need to consider switching the rescue  medicines to Dilaudid. He is having trouble sleeping due to the pain. Type 2 diabetes Obesity Hyperlipidemia Hypertension history of atrial fibrillation History of DVT/pulmonary embolus

## 2015-07-15 ENCOUNTER — Telehealth: Payer: Self-pay | Admitting: Family Medicine

## 2015-07-15 NOTE — Telephone Encounter (Signed)
Pt wife Rise Paganini states there is a problem with pt Rx for OxyCODONE (OXYCONTIN) 40 mg and OxyCODONE 20mg  with the mail order.  VH#[phone]/AT

## 2015-07-16 NOTE — Telephone Encounter (Signed)
Spoke with patient's wife and [facility] and corrected directions for Oxycontin 40 mg should say TID not every 4 hours as needed.-aa

## 2015-07-18 DIAGNOSIS — M170 Bilateral primary osteoarthritis of knee: Secondary | ICD-10-CM | POA: Diagnosis not present

## 2015-07-18 DIAGNOSIS — M1711 Unilateral primary osteoarthritis, right knee: Secondary | ICD-10-CM | POA: Diagnosis not present

## 2015-07-23 ENCOUNTER — Telehealth: Payer: Self-pay | Admitting: Family Medicine

## 2015-07-23 DIAGNOSIS — G4730 Sleep apnea, unspecified: Secondary | ICD-10-CM

## 2015-07-23 NOTE — Telephone Encounter (Signed)
metFORMIN (GLUCOPHAGE) 1000 MG tablet  pt needs ok for Pharmacy to refill.  [facility] [address]

## 2015-07-24 ENCOUNTER — Other Ambulatory Visit: Payer: Self-pay

## 2015-07-24 MED ORDER — METFORMIN HCL 1000 MG PO TABS: 1000.0000 mg | ORAL_TABLET | Freq: Two times a day (BID) | ORAL | Status: DC

## 2015-07-28 ENCOUNTER — Ambulatory Visit (INDEPENDENT_AMBULATORY_CARE_PROVIDER_SITE_OTHER): Payer: Medicare Other | Admitting: Family Medicine

## 2015-07-28 ENCOUNTER — Encounter: Payer: Self-pay | Admitting: Family Medicine

## 2015-07-28 VITALS — BP 148/60 | HR 86 | Temp 97.9°F | Resp 16 | Wt 251.0 lb

## 2015-07-28 DIAGNOSIS — M549 Dorsalgia, unspecified: Secondary | ICD-10-CM | POA: Diagnosis not present

## 2015-07-28 DIAGNOSIS — M170 Bilateral primary osteoarthritis of knee: Secondary | ICD-10-CM | POA: Diagnosis not present

## 2015-07-28 DIAGNOSIS — G4730 Sleep apnea, unspecified: Secondary | ICD-10-CM | POA: Diagnosis not present

## 2015-07-28 DIAGNOSIS — J690 Pneumonitis due to inhalation of food and vomit: Secondary | ICD-10-CM | POA: Diagnosis not present

## 2015-07-28 DIAGNOSIS — G8929 Other chronic pain: Secondary | ICD-10-CM

## 2015-07-28 MED ORDER — OXYCODONE HCL 20 MG PO TABS
1.0000 | ORAL_TABLET | ORAL | Status: DC | PRN
Start: 2015-07-28 — End: 2015-12-10

## 2015-07-28 NOTE — Progress Notes (Signed)
Patient ID: Jerry Parsons, male   DOB: 23-Aug-1951, 64 y.o.   MRN: [id number]    Subjective:  HPI Chronic pain-- He is here to follow up from his chronic pain. He was been 2 weeks ago and we increased his Oxycodone to 20 mg for breakthrough-- may need to change to Dilaudid. He did not get this prescription because insurance would not cover it because it was only a 22 days supply. Will need a supply for 30 days for it to be covered.    Prior to Admission medications   Medication Sig Start Date End Date Taking? Authorizing Provider  amiodarone (PACERONE) 200 MG tablet Take 1 tablet (200 mg total) by mouth daily. 02/06/15  Yes Minna Merritts, MD  amitriptyline (ELAVIL) 25 MG tablet Take 25 mg by mouth at bedtime.   Yes Historical Provider, MD  aspirin 81 MG tablet Take 81 mg by mouth daily.  04/25/13  Yes Minna Merritts, MD  budesonide (RHINOCORT AQUA) 32 MCG/ACT nasal spray Place 1 spray into the nose daily as needed.    Yes Historical Provider, MD  cyclobenzaprine (FLEXERIL) 10 MG tablet Take 10 mg by mouth 2 (two) times daily.  12/04/12  Yes Historical Provider, MD  diazepam (VALIUM) 2 MG tablet Take 2 mg by mouth every 8 (eight) hours as needed.    Yes Historical Provider, MD  dutasteride (AVODART) 0.5 MG capsule Take 0.5 mg by mouth daily.     Yes Historical Provider, MD  esomeprazole (NEXIUM) 40 MG capsule Take 40 mg by mouth 2 (two) times daily.    Yes Historical Provider, MD  fexofenadine (ALLEGRA) 180 MG tablet Take 180 mg by mouth daily.     Yes Historical Provider, MD  furosemide (LASIX) 40 MG tablet TAKE 2 TABLETS BY MOUTH DAILY 06/16/15  Yes Jerrol Banana., MD  gabapentin (NEURONTIN) 800 MG tablet Take 800 mg by mouth 3 (three) times daily.    Yes Historical Provider, MD  hydrochlorothiazide (HYDRODIURIL) 25 MG tablet Take 1 tablet (25 mg total) by mouth daily. Patient taking differently: Take 12.5 mg by mouth daily.  02/06/15  Yes Minna Merritts, MD  Insulin Glargine (LANTUS  SOLOSTAR [place]) Inject 20 Units into the skin daily.    Yes Historical Provider, MD  Liraglutide (VICTOZA [place]) 1.2 once in the am.   Yes Historical Provider, MD  Liraglutide 18 MG/3ML SOPN Inject into the skin.   Yes Historical Provider, MD  metFORMIN (GLUCOPHAGE) 1000 MG tablet Take 1 tablet (1,000 mg total) by mouth 2 (two) times daily. 07/24/15  Yes Richard Maceo Pro., MD  metoprolol (LOPRESSOR) 50 MG tablet Take 1 tablet (50 mg total) by mouth 2 (two) times daily. 07/07/15  Yes Minna Merritts, MD  Multiple Vitamins-Minerals (CENTRUM SILVER ULTRA MENS PO) Take by mouth daily.   Yes Historical Provider, MD  NON FORMULARY Depro shot one every 2 weeks   Yes Historical Provider, MD  Omega-3 Fatty Acids (FISH OIL) 1200 MG CAPS Take 1,200 mg by mouth 2 (two) times daily.   Yes Historical Provider, MD  oxyCODONE (OXYCONTIN) 40 MG 12 hr tablet Take 40 mg by mouth 3 (three) times daily.     Yes Historical Provider, MD  OxyCODONE (OXYCONTIN) 40 mg T12A 12 hr tablet Take 1 tablet (40 mg total) by mouth every 4 (four) hours as needed. 07/11/15  Yes Richard Maceo Pro., MD  Oxycodone HCl 10 MG TABS Take 10 mg by mouth as needed.  Yes Historical Provider, MD  Oxycodone HCl 20 MG TABS Take 1 tablet (20 mg total) by mouth every 4 (four) hours as needed. 07/11/15  Yes Richard Maceo Pro., MD  pravastatin (PRAVACHOL) 10 MG tablet Take 10 mg by mouth daily.   Yes Historical Provider, MD  ranitidine (ZANTAC) 300 MG tablet Take 300 mg by mouth at bedtime.     Yes Historical Provider, MD  sitaGLIPtan (JANUVIA) 100 MG tablet Take 100 mg by mouth daily.     Yes Historical Provider, MD  testosterone cypionate (DEPOTESTOTERONE CYPIONATE) 200 MG/ML injection  09/23/13  Yes Historical Provider, MD    Patient Active Problem List   Diagnosis Date Noted  . AF (paroxysmal atrial fibrillation) 07/04/2015  . Aspiration pneumonia 07/04/2015  . Arthritis of knee, degenerative 05/28/2015  . Chronic back pain 09/17/2014  .  Leg edema 05/07/2014  . Chronic diastolic CHF (congestive heart failure) 05/07/2014  . Campylobacter diarrhea 04/25/2013  . Atrial flutter 01/23/2013  . Obesity 05/19/2012  . Hyperlipidemia 11/11/2011  . Diastolic dysfunction 93/81/8299  . SOB (shortness of breath) 04/12/2011  . Diabetes mellitus type 2, uncontrolled, with complications 37/16/9678  . HYPERTENSION, BENIGN 03/15/2011  . DVT 03/15/2011  . TACHYCARDIA 03/15/2011    Past Medical History  Diagnosis Date  . Hypertension   . Arrhythmia     tachycardia  . DVT (deep venous thrombosis) 02/2010  . Diabetes mellitus   . Pulmonary embolus   . Kidney failure   . Sleep apnea   . Neuropathy   . GERD (gastroesophageal reflux disease)   . Depression   . BPH (benign prostatic hyperplasia)   . 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     x2    History   Social History  . Marital Status: Married    Spouse Name: N/A  . Number of Children: N/A  . Years of Education: N/A   Occupational History  . Not on file.   Social History Main Topics  . Smoking status: Former Smoker -- 1.00 packs/day for 25 years    Types: Cigarettes    Quit date: 12/26/1989  . Smokeless tobacco: Never Used  . Alcohol Use: No  . Drug Use: No  . Sexual Activity: Not on file   Other Topics Concern  . Not on file   Social History Narrative    Allergies  Allergen Reactions  . Carisoprodol Other (See Comments)    Other reaction(s): Unknown Sensitivity     Review of Systems  Constitutional: Negative.   HENT: Negative.   Eyes: Negative.   Respiratory: Negative.   Cardiovascular: Negative.   Gastrointestinal: Negative.   Genitourinary: Negative.   Musculoskeletal: Positive for back pain and joint pain.  Skin: Negative.   Neurological: Negative.   Endo/Heme/Allergies: Negative.   Psychiatric/Behavioral: Negative.     Immunization History  Administered Date(s) Administered  . Influenza-Unspecified 08/27/2014   Objective:  BP  148/60 mmHg  Pulse 86  Temp(Src) 97.9 F (36.6 C) (Oral)  Resp 16  Wt 251 lb (113.853 kg)  Physical Exam  Constitutional: He is oriented to person, place, and time and well-developed, well-nourished, and in no distress.  HENT:  Head: Normocephalic and atraumatic.  Left Ear: External ear normal.  Nose: Nose normal.  Eyes: Conjunctivae and EOM are normal. Pupils are equal, round, and reactive to light.  Neck: Normal range of motion. Neck supple.  Cardiovascular: Normal rate, regular rhythm, normal heart sounds and intact distal pulses.   Pulmonary/Chest: Effort normal and  breath sounds normal.  Abdominal: Soft. Bowel sounds are normal.  Musculoskeletal: Normal range of motion. He exhibits edema (trace).  Neurological: He is alert and oriented to person, place, and time. He has normal reflexes. Gait normal.  Skin: Skin is warm and dry.  Psychiatric: Mood, memory, affect and judgment normal.    Lab Results  Component Value Date   WBC 12.2* 04/12/2013   HGB 14.6 04/12/2013   HCT 45.6 04/12/2013   PLT 203 04/12/2013   GLUCOSE 146* 04/12/2013   CHOL 93 04/03/2013   TRIG 163 04/03/2013   HDL 25* 04/03/2013   LDLCALC 35 04/03/2013   INR 1.2 04/02/2013    CMP     Component Value Date/Time   NA 137 04/12/2013 1150   NA 140 03/09/2013 1106   NA 133* 02/05/2010 1040   K 4.0 04/12/2013 1150   K 4.1 03/09/2013 1106   CL 104 04/12/2013 1150   CL 97 03/09/2013 1106   CO2 30 04/12/2013 1150   CO2 23 03/09/2013 1106   GLUCOSE 146* 04/12/2013 1150   GLUCOSE 217* 03/09/2013 1106   GLUCOSE 377* 02/05/2010 1040   BUN 11 04/12/2013 1150   BUN 10 03/09/2013 1106   BUN 22 02/05/2010 1040   CREATININE 0.98 04/12/2013 1150   CREATININE 1.13 03/09/2013 1106   CALCIUM 8.8 04/12/2013 1150   CALCIUM 9.1 03/09/2013 1106   PROT 6.9 04/12/2013 1150   PROT 7.2 02/05/2010 1040   ALBUMIN 3.3* 04/12/2013 1150   ALBUMIN 4.2 02/05/2010 1040   AST 22 04/12/2013 1150   AST 22 02/05/2010 1040    ALT 29 04/12/2013 1150   ALT 36 02/05/2010 1040   ALKPHOS 99 04/12/2013 1150   ALKPHOS 110 02/05/2010 1040   BILITOT 0.5 04/12/2013 1150   BILITOT 0.7 02/05/2010 1040   GFRNONAA >60 04/12/2013 1150   GFRNONAA 69 03/09/2013 1106   GFRAA >60 04/12/2013 1150   GFRAA 80 03/09/2013 1106    Assessment and Plan :  1. Chronic back pain  - Oxycodone HCl 20 MG TABS; Take 1 tablet (20 mg total) by mouth every 4 (four) hours as needed.  Dispense: 180 tablet; Refill: 0  2. Aspiration pneumonia, unspecified aspiration pneumonia type Pt will call back closer to the 1 month mark of his diagnoses to get order for  Follow up CXR. Will need to get this for up coming surgery also.    3. Primary osteoarthritis of both knees Pt is scheduled for surgery. Will need clearance from Dr. Rockey Situ. Pt will make an appt for this closer to surgery date, will fill out form at that time.   Patient was seen and examined by Dr. Miguel Aschoff, and noted scribed by Webb Laws, CMA  4. Sleep apnea- Pt still has not received new machine. Spoke with Judson Roch to get this set up.   patient has documented sleep apnea. He quit using his CPAP 2 years ago because he could not get a comfortable fit for his mask nor what his facial hair allow him to get a seal. I strongly feel he needs to restart this and he is willing to do so. I would like to get an auto titrating CPAP for him but my guess is insurance will not cover this. The most we should have to do is obtain US overnight split night sleep study. He should not have to go through 2 nights. He has known sleep apnea. I think treating this with CPAP will lower his risk of repeated  aspiration with pneumonia.  Miguel Aschoff MD [facility] 07/28/2015 2:15 PM

## 2015-07-29 ENCOUNTER — Ambulatory Visit: Payer: Medicare Other | Admitting: Family Medicine

## 2015-07-30 NOTE — Telephone Encounter (Signed)
Note is done from this office visit this week that should reflect need for split-night sleep study.

## 2015-07-30 NOTE — Telephone Encounter (Signed)
Per Kasson Malta at [facility] since pt has not used CPAP since 2013 he will have to start process over including coming in or office for documentation of sleep issues and another sleep study.Pt advised and will call to schedule appointment when he is back in town

## 2015-07-31 NOTE — Telephone Encounter (Signed)
Judson Roch this was sent to [facility] nurse, I think by Dr Darnell Level.  I think it was meant for you so you can forward notes for the order??? If there is something I need to do just let me know. Thanks   [employer]

## 2015-07-31 NOTE — Telephone Encounter (Signed)
OK.I will just need referral added to Epic and I will send back the form for you to sign for [facility]

## 2015-08-01 NOTE — Telephone Encounter (Signed)
Order put in-aa 

## 2015-08-06 ENCOUNTER — Telehealth: Payer: Self-pay | Admitting: *Deleted

## 2015-08-06 NOTE — Telephone Encounter (Signed)
No medications to hold as he is not on full dose long term anticoagulation. Would continue aspirin.

## 2015-08-06 NOTE — Telephone Encounter (Signed)
Routed to Dr. Theda Sers' office.

## 2015-08-06 NOTE — Telephone Encounter (Signed)
Request for surgical clearance:  1. What type of surgery is being performed? Ortho scopic Left knee surgery   2. When is this surgery scheduled? Sep 8th   3. Are there any medications that need to be held prior to surgery and how long? Not sure, they would know Friday prior to surgery.  4. Name of physician performing surgery? Dr Theda Sers from [facility]. What is your office phone and fax number?   Fax: [phone]   Pt is also coming oct 4th and is on the wait list.

## 2015-08-07 ENCOUNTER — Other Ambulatory Visit: Payer: Self-pay | Admitting: Family Medicine

## 2015-08-14 ENCOUNTER — Telehealth: Payer: Self-pay

## 2015-08-14 DIAGNOSIS — R059 Cough, unspecified: Secondary | ICD-10-CM

## 2015-08-14 DIAGNOSIS — R05 Cough: Secondary | ICD-10-CM

## 2015-08-14 NOTE — Telephone Encounter (Signed)
Wife Rise Paganini called requesting repeat chest xray for patient's f/u pneumonia and it is also needed for surgical clearance, order placed-aa

## 2015-08-15 ENCOUNTER — Ambulatory Visit
Admission: RE | Admit: 2015-08-15 | Discharge: 2015-08-15 | Disposition: A | Discharge status: Home / Self Care | Payer: Medicare Other | Source: Ambulatory Visit | Attending: Family Medicine | Admitting: Family Medicine

## 2015-08-15 DIAGNOSIS — R059 Cough, unspecified: Secondary | ICD-10-CM

## 2015-08-15 DIAGNOSIS — R05 Cough: Secondary | ICD-10-CM | POA: Insufficient documentation

## 2015-08-15 DIAGNOSIS — Z01818 Encounter for other preprocedural examination: Secondary | ICD-10-CM | POA: Diagnosis not present

## 2015-08-18 ENCOUNTER — Other Ambulatory Visit: Payer: Self-pay | Admitting: Family Medicine

## 2015-08-18 ENCOUNTER — Ambulatory Visit: Payer: Medicare Other | Admitting: Family Medicine

## 2015-08-18 NOTE — Telephone Encounter (Signed)
Needs to be approved

## 2015-08-19 DIAGNOSIS — M47816 Spondylosis without myelopathy or radiculopathy, lumbar region: Secondary | ICD-10-CM | POA: Diagnosis not present

## 2015-08-19 DIAGNOSIS — M4806 Spinal stenosis, lumbar region: Secondary | ICD-10-CM | POA: Diagnosis not present

## 2015-08-20 ENCOUNTER — Ambulatory Visit (INDEPENDENT_AMBULATORY_CARE_PROVIDER_SITE_OTHER): Payer: Medicare Other | Admitting: Family Medicine

## 2015-08-20 ENCOUNTER — Encounter: Payer: Self-pay | Admitting: Family Medicine

## 2015-08-20 VITALS — BP 104/56 | HR 84 | Temp 97.9°F | Resp 16 | Wt 249.0 lb

## 2015-08-20 DIAGNOSIS — Z01811 Encounter for preprocedural respiratory examination: Secondary | ICD-10-CM

## 2015-08-20 NOTE — Progress Notes (Signed)
Patient ID: Jerry Parsons, male   DOB: 02/04/1951, 64 y.o.   MRN: [id number]    Subjective:  HPI  Surgical clearance: Patient is here for clearance. He has surgery for left knee arthroscopy scheduled for September 8th by Dr.Collins in [place]. Symptoms present are left knee pain and stiffness.   Prior to Admission medications   Medication Sig Start Date End Date Taking? Authorizing Provider  amiodarone (PACERONE) 200 MG tablet Take 1 tablet (200 mg total) by mouth daily. 02/06/15  Yes Minna Merritts, MD  amitriptyline (ELAVIL) 25 MG tablet Take 25 mg by mouth at bedtime.   Yes Historical Provider, MD  aspirin 81 MG tablet Take 81 mg by mouth daily.  04/25/13  Yes Minna Merritts, MD  budesonide (RHINOCORT AQUA) 32 MCG/ACT nasal spray Place 1 spray into the nose daily as needed.    Yes Historical Provider, MD  cyclobenzaprine (FLEXERIL) 10 MG tablet Take 10 mg by mouth 2 (two) times daily.  12/04/12  Yes Historical Provider, MD  diazepam (VALIUM) 2 MG tablet TAKE 1 TABLET BY MOUTH 3 TIMES A DAY AS NEEDED 08/20/15  Yes Jerrol Banana., MD  dutasteride (AVODART) 0.5 MG capsule Take 0.5 mg by mouth daily.     Yes Historical Provider, MD  esomeprazole (NEXIUM) 40 MG capsule Take 40 mg by mouth 2 (two) times daily.    Yes Historical Provider, MD  fenofibrate (TRICOR) 145 MG tablet Take 145 mg by mouth daily. 08/06/15  Yes Historical Provider, MD  fexofenadine (ALLEGRA) 180 MG tablet Take 180 mg by mouth daily.     Yes Historical Provider, MD  furosemide (LASIX) 40 MG tablet TAKE 2 TABLETS BY MOUTH DAILY 06/16/15  Yes Jerrol Banana., MD  gabapentin (NEURONTIN) 800 MG tablet Take 800 mg by mouth 3 (three) times daily.    Yes Historical Provider, MD  hydrochlorothiazide (HYDRODIURIL) 25 MG tablet Take 1 tablet (25 mg total) by mouth daily. Patient taking differently: Take 12.5 mg by mouth daily.  02/06/15  Yes Minna Merritts, MD  Insulin Glargine (LANTUS SOLOSTAR [place]) Inject 20 Units  into the skin daily.    Yes Historical Provider, MD  Liraglutide (VICTOZA [place]) 1.2 once in the am.   Yes Historical Provider, MD  Liraglutide 18 MG/3ML SOPN Inject into the skin.   Yes Historical Provider, MD  metFORMIN (GLUCOPHAGE) 1000 MG tablet Take 1 tablet (1,000 mg total) by mouth 2 (two) times daily. 07/24/15  Yes Marien Manship Maceo Pro., MD  metoprolol (LOPRESSOR) 50 MG tablet Take 1 tablet (50 mg total) by mouth 2 (two) times daily. 07/07/15  Yes Minna Merritts, MD  Multiple Vitamins-Minerals (CENTRUM SILVER ULTRA MENS PO) Take by mouth daily.   Yes Historical Provider, MD  NON FORMULARY Depro shot one every 2 weeks   Yes Historical Provider, MD  NOVOLOG FLEXPEN 100 UNIT/ML FlexPen  08/17/15  Yes Historical Provider, MD  NOVOTWIST 32G X 5 MM MISC USE TWICE DAILY AS DIRECTED 08/07/15  Yes Branndon Tuite Maceo Pro., MD  Omega-3 Fatty Acids (FISH OIL) 1200 MG CAPS Take 1,200 mg by mouth 2 (two) times daily.   Yes Historical Provider, MD  ONE TOUCH ULTRA TEST test strip TEST EVERY DAY 08/04/15  Yes Historical Provider, MD  oxyCODONE (OXYCONTIN) 40 MG 12 hr tablet Take 40 mg by mouth 3 (three) times daily.     Yes Historical Provider, MD  OxyCODONE (OXYCONTIN) 40 mg T12A 12 hr tablet Take 1 tablet (  40 mg total) by mouth every 4 (four) hours as needed. 07/11/15  Yes Einar Nolasco Maceo Pro., MD  Oxycodone HCl 10 MG TABS Take 10 mg by mouth as needed.   Yes Historical Provider, MD  Oxycodone HCl 20 MG TABS Take 1 tablet (20 mg total) by mouth every 4 (four) hours as needed. 07/28/15  Yes Audreana Hancox Maceo Pro., MD  pravastatin (PRAVACHOL) 10 MG tablet Take 10 mg by mouth daily.   Yes Historical Provider, MD  ranitidine (ZANTAC) 300 MG capsule  08/18/15  Yes Historical Provider, MD  ranitidine (ZANTAC) 300 MG tablet Take 300 mg by mouth at bedtime.     Yes Historical Provider, MD  sitaGLIPtan (JANUVIA) 100 MG tablet Take 100 mg by mouth daily.     Yes Historical Provider, MD  testosterone cypionate (DEPOTESTOTERONE  CYPIONATE) 200 MG/ML injection  09/23/13  Yes Historical Provider, MD    Patient Active Problem List   Diagnosis Date Noted  . AF (paroxysmal atrial fibrillation) 07/04/2015  . Aspiration pneumonia 07/04/2015  . Arthritis of knee, degenerative 05/28/2015  . Chronic back pain 09/17/2014  . Leg edema 05/07/2014  . Chronic diastolic CHF (congestive heart failure) 05/07/2014  . Campylobacter diarrhea 04/25/2013  . Atrial flutter 01/23/2013  . Obesity 05/19/2012  . Hyperlipidemia 11/11/2011  . Diastolic dysfunction 81/82/9937  . SOB (shortness of breath) 04/12/2011  . Diabetes mellitus type 2, uncontrolled, with complications 16/96/7893  . HYPERTENSION, BENIGN 03/15/2011  . DVT 03/15/2011  . TACHYCARDIA 03/15/2011    Past Medical History  Diagnosis Date  . Hypertension   . Arrhythmia     tachycardia  . DVT (deep venous thrombosis) 02/2010  . Diabetes mellitus   . Pulmonary embolus   . Kidney failure   . Sleep apnea   . Neuropathy   . GERD (gastroesophageal reflux disease)   . Depression   . BPH (benign prostatic hyperplasia)   . 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     x2    Social History   Social History  . Marital Status: Married    Spouse Name: N/A  . Number of Children: N/A  . Years of Education: N/A   Occupational History  . Not on file.   Social History Main Topics  . Smoking status: Former Smoker -- 1.00 packs/day for 25 years    Types: Cigarettes    Quit date: 12/26/1989  . Smokeless tobacco: Never Used  . Alcohol Use: No  . Drug Use: No  . Sexual Activity: Not on file   Other Topics Concern  . Not on file   Social History Narrative    Allergies  Allergen Reactions  . Carisoprodol Other (See Comments)    Other reaction(s): Unknown Sensitivity     Review of Systems  Constitutional: Negative.   Respiratory: Negative.   Cardiovascular: Negative.   Gastrointestinal: Negative.   Musculoskeletal: Positive for back pain and joint pain.    Neurological: Negative.   Psychiatric/Behavioral: Negative.     Immunization History  Administered Date(s) Administered  . Influenza-Unspecified 08/27/2014   Objective:  BP 104/56 mmHg  Pulse 84  Temp(Src) 97.9 F (36.6 C)  Resp 16  Wt 249 lb (112.946 kg)  Physical Exam  Constitutional: He is well-developed, well-nourished, and in no distress.  HENT:  Head: Normocephalic and atraumatic.  Right Ear: External ear normal.  Left Ear: External ear normal.  Nose: Nose normal.  Eyes: Conjunctivae are normal.  Neck: Neck supple.  Cardiovascular: Normal rate, regular rhythm  and normal heart sounds.   Pulmonary/Chest: Effort normal and breath sounds normal.  Abdominal: Soft. Bowel sounds are normal.  Neurological: He is alert.  Chronic mild ptosis of right eye  Skin: Skin is warm and dry.  Psychiatric: Memory, affect and judgment normal.    Lab Results  Component Value Date   WBC 12.2* 04/12/2013   HGB 14.6 04/12/2013   HCT 45.6 04/12/2013   PLT 203 04/12/2013   GLUCOSE 146* 04/12/2013   CHOL 93 04/03/2013   TRIG 163 04/03/2013   HDL 25* 04/03/2013   LDLCALC 35 04/03/2013   INR 1.2 04/02/2013    CMP     Component Value Date/Time   NA 137 04/12/2013 1150   NA 140 03/09/2013 1106   NA 133* 02/05/2010 1040   K 4.0 04/12/2013 1150   K 4.1 03/09/2013 1106   CL 104 04/12/2013 1150   CL 97 03/09/2013 1106   CO2 30 04/12/2013 1150   CO2 23 03/09/2013 1106   GLUCOSE 146* 04/12/2013 1150   GLUCOSE 217* 03/09/2013 1106   GLUCOSE 377* 02/05/2010 1040   BUN 11 04/12/2013 1150   BUN 10 03/09/2013 1106   BUN 22 02/05/2010 1040   CREATININE 0.98 04/12/2013 1150   CREATININE 1.13 03/09/2013 1106   CALCIUM 8.8 04/12/2013 1150   CALCIUM 9.1 03/09/2013 1106   PROT 6.9 04/12/2013 1150   PROT 7.2 02/05/2010 1040   ALBUMIN 3.3* 04/12/2013 1150   ALBUMIN 4.2 02/05/2010 1040   AST 22 04/12/2013 1150   AST 22 02/05/2010 1040   ALT 29 04/12/2013 1150   ALT 36 02/05/2010 1040    ALKPHOS 99 04/12/2013 1150   ALKPHOS 110 02/05/2010 1040   BILITOT 0.5 04/12/2013 1150   BILITOT 0.7 02/05/2010 1040   GFRNONAA >60 04/12/2013 1150   GFRNONAA 69 03/09/2013 1106   GFRAA >60 04/12/2013 1150   GFRAA 80 03/09/2013 1106    Assessment and Plan :  Chronic knee pain Patient medically cleared for knee arthroscopy. Recent pneumonia Clinically this has resolved and chest x-ray will be cleared before surgery Obstructive sleep apnea Sleep study arranged for next week. I am trying to get all this done prior to his surgery. This is just an arthroscopy that should not be as involved as previous surgeries.  Miguel Aschoff MD [facility] 08/20/2015 3:18 PM

## 2015-08-27 ENCOUNTER — Ambulatory Visit: Payer: Medicare Other | Attending: Otolaryngology

## 2015-08-27 DIAGNOSIS — G4733 Obstructive sleep apnea (adult) (pediatric): Secondary | ICD-10-CM | POA: Insufficient documentation

## 2015-08-27 DIAGNOSIS — G4700 Insomnia, unspecified: Secondary | ICD-10-CM | POA: Diagnosis present

## 2015-08-27 DIAGNOSIS — R0683 Snoring: Secondary | ICD-10-CM | POA: Insufficient documentation

## 2015-09-04 DIAGNOSIS — M94262 Chondromalacia, left knee: Secondary | ICD-10-CM | POA: Diagnosis not present

## 2015-09-04 DIAGNOSIS — Y929 Unspecified place or not applicable: Secondary | ICD-10-CM | POA: Diagnosis not present

## 2015-09-04 DIAGNOSIS — M23352 Other meniscus derangements, posterior horn of lateral meniscus, left knee: Secondary | ICD-10-CM | POA: Diagnosis not present

## 2015-09-04 DIAGNOSIS — S83282A Other tear of lateral meniscus, current injury, left knee, initial encounter: Secondary | ICD-10-CM | POA: Diagnosis not present

## 2015-09-04 DIAGNOSIS — X58XXXA Exposure to other specified factors, initial encounter: Secondary | ICD-10-CM | POA: Diagnosis not present

## 2015-09-04 DIAGNOSIS — S83232A Complex tear of medial meniscus, current injury, left knee, initial encounter: Secondary | ICD-10-CM | POA: Diagnosis not present

## 2015-09-04 DIAGNOSIS — M23322 Other meniscus derangements, posterior horn of medial meniscus, left knee: Secondary | ICD-10-CM | POA: Diagnosis not present

## 2015-09-04 DIAGNOSIS — G8918 Other acute postprocedural pain: Secondary | ICD-10-CM | POA: Diagnosis not present

## 2015-09-05 ENCOUNTER — Encounter: Payer: Self-pay | Admitting: Family Medicine

## 2015-09-08 ENCOUNTER — Observation Stay
Admission: EM | Admit: 2015-09-08 | Discharge: 2015-09-09 | Disposition: A | Discharge status: Home / Self Care | Payer: Medicare Other | Attending: Internal Medicine | Admitting: Internal Medicine

## 2015-09-08 ENCOUNTER — Encounter: Payer: Self-pay | Admitting: Emergency Medicine

## 2015-09-08 ENCOUNTER — Emergency Department: Payer: Medicare Other

## 2015-09-08 DIAGNOSIS — Z7982 Long term (current) use of aspirin: Secondary | ICD-10-CM | POA: Diagnosis not present

## 2015-09-08 DIAGNOSIS — G4730 Sleep apnea, unspecified: Secondary | ICD-10-CM | POA: Insufficient documentation

## 2015-09-08 DIAGNOSIS — Z9889 Other specified postprocedural states: Secondary | ICD-10-CM | POA: Diagnosis not present

## 2015-09-08 DIAGNOSIS — R42 Dizziness and giddiness: Secondary | ICD-10-CM | POA: Diagnosis not present

## 2015-09-08 DIAGNOSIS — G459 Transient cerebral ischemic attack, unspecified: Principal | ICD-10-CM | POA: Insufficient documentation

## 2015-09-08 DIAGNOSIS — F329 Major depressive disorder, single episode, unspecified: Secondary | ICD-10-CM | POA: Diagnosis not present

## 2015-09-08 DIAGNOSIS — Z87891 Personal history of nicotine dependence: Secondary | ICD-10-CM | POA: Insufficient documentation

## 2015-09-08 DIAGNOSIS — E860 Dehydration: Secondary | ICD-10-CM | POA: Diagnosis present

## 2015-09-08 DIAGNOSIS — Z981 Arthrodesis status: Secondary | ICD-10-CM | POA: Insufficient documentation

## 2015-09-08 DIAGNOSIS — G8929 Other chronic pain: Secondary | ICD-10-CM | POA: Diagnosis not present

## 2015-09-08 DIAGNOSIS — N189 Chronic kidney disease, unspecified: Secondary | ICD-10-CM | POA: Diagnosis not present

## 2015-09-08 DIAGNOSIS — Z86711 Personal history of pulmonary embolism: Secondary | ICD-10-CM | POA: Diagnosis not present

## 2015-09-08 DIAGNOSIS — E1122 Type 2 diabetes mellitus with diabetic chronic kidney disease: Secondary | ICD-10-CM | POA: Diagnosis not present

## 2015-09-08 DIAGNOSIS — I6529 Occlusion and stenosis of unspecified carotid artery: Secondary | ICD-10-CM | POA: Insufficient documentation

## 2015-09-08 DIAGNOSIS — I129 Hypertensive chronic kidney disease with stage 1 through stage 4 chronic kidney disease, or unspecified chronic kidney disease: Secondary | ICD-10-CM | POA: Diagnosis not present

## 2015-09-08 DIAGNOSIS — R61 Generalized hyperhidrosis: Secondary | ICD-10-CM | POA: Insufficient documentation

## 2015-09-08 DIAGNOSIS — R4182 Altered mental status, unspecified: Secondary | ICD-10-CM | POA: Diagnosis not present

## 2015-09-08 DIAGNOSIS — R4781 Slurred speech: Secondary | ICD-10-CM | POA: Insufficient documentation

## 2015-09-08 DIAGNOSIS — I10 Essential (primary) hypertension: Secondary | ICD-10-CM | POA: Diagnosis not present

## 2015-09-08 DIAGNOSIS — Z8249 Family history of ischemic heart disease and other diseases of the circulatory system: Secondary | ICD-10-CM | POA: Diagnosis not present

## 2015-09-08 DIAGNOSIS — R531 Weakness: Secondary | ICD-10-CM | POA: Insufficient documentation

## 2015-09-08 DIAGNOSIS — Z888 Allergy status to other drugs, medicaments and biological substances status: Secondary | ICD-10-CM | POA: Insufficient documentation

## 2015-09-08 DIAGNOSIS — Z794 Long term (current) use of insulin: Secondary | ICD-10-CM | POA: Insufficient documentation

## 2015-09-08 DIAGNOSIS — M4802 Spinal stenosis, cervical region: Secondary | ICD-10-CM | POA: Diagnosis not present

## 2015-09-08 DIAGNOSIS — Z86718 Personal history of other venous thrombosis and embolism: Secondary | ICD-10-CM | POA: Insufficient documentation

## 2015-09-08 DIAGNOSIS — I639 Cerebral infarction, unspecified: Secondary | ICD-10-CM | POA: Diagnosis not present

## 2015-09-08 DIAGNOSIS — K219 Gastro-esophageal reflux disease without esophagitis: Secondary | ICD-10-CM | POA: Diagnosis not present

## 2015-09-08 DIAGNOSIS — N400 Enlarged prostate without lower urinary tract symptoms: Secondary | ICD-10-CM | POA: Insufficient documentation

## 2015-09-08 DIAGNOSIS — M6281 Muscle weakness (generalized): Secondary | ICD-10-CM | POA: Diagnosis not present

## 2015-09-08 DIAGNOSIS — M549 Dorsalgia, unspecified: Secondary | ICD-10-CM | POA: Diagnosis not present

## 2015-09-08 LAB — CBC
HCT: 37.3 % — ABNORMAL LOW (ref 40.0–52.0)
Hemoglobin: 12.8 g/dL — ABNORMAL LOW (ref 13.0–18.0)
MCH: 30.5 pg (ref 26.0–34.0)
MCHC: 34.2 g/dL (ref 32.0–36.0)
MCV: 89.1 fL (ref 80.0–100.0)
Platelets: 127 10*3/uL — ABNORMAL LOW (ref 150–440)
RBC: 4.19 MIL/uL — ABNORMAL LOW (ref 4.40–5.90)
RDW: 14.3 % (ref 11.5–14.5)
WBC: 5.9 10*3/uL (ref 3.8–10.6)

## 2015-09-08 LAB — COMPREHENSIVE METABOLIC PANEL
ALT: 23 U/L (ref 17–63)
AST: 31 U/L (ref 15–41)
Albumin: 3.8 g/dL (ref 3.5–5.0)
Alkaline Phosphatase: 58 U/L (ref 38–126)
Anion gap: 9 (ref 5–15)
BUN: 16 mg/dL (ref 6–20)
CO2: 30 mmol/L (ref 22–32)
Calcium: 9.7 mg/dL (ref 8.9–10.3)
Chloride: 96 mmol/L — ABNORMAL LOW (ref 101–111)
Creatinine, Ser: 1.34 mg/dL — ABNORMAL HIGH (ref 0.61–1.24)
GFR calc Af Amer: >60 mL/min (ref >60)
GFR calc non Af Amer: 54 mL/min — ABNORMAL LOW (ref >60)
Glucose, Bld: 299 mg/dL — ABNORMAL HIGH (ref 65–99)
Potassium: 3.8 mmol/L (ref 3.5–5.1)
Sodium: 135 mmol/L (ref 135–145)
Total Bilirubin: 0.6 mg/dL (ref 0.3–1.2)
Total Protein: 6.7 g/dL (ref 6.5–8.1)

## 2015-09-08 LAB — URINALYSIS COMPLETE WITH MICROSCOPIC (ARMC ONLY)
Bacteria, UA: NONE SEEN
Bilirubin Urine: NEGATIVE
Glucose, UA: >500 mg/dL — AB
Hgb urine dipstick: NEGATIVE
Ketones, ur: NEGATIVE mg/dL
Leukocytes, UA: NEGATIVE
Nitrite: NEGATIVE
Protein, ur: NEGATIVE mg/dL
Specific Gravity, Urine: 1.01 (ref 1.005–1.030)
pH: 6 (ref 5.0–8.0)

## 2015-09-08 LAB — GLUCOSE, CAPILLARY: Glucose-Capillary: 224 mg/dL — ABNORMAL HIGH (ref 65–99)

## 2015-09-08 LAB — TROPONIN I: Troponin I: <0.03 ng/mL (ref <0.031)

## 2015-09-08 MED ORDER — INSULIN GLARGINE 100 UNIT/ML ~~LOC~~ SOLN
20.0000 [IU] | Freq: Every day | SUBCUTANEOUS | Status: DC
  Administered 2015-09-08: 20 [IU] via SUBCUTANEOUS
  Filled 2015-09-08 (×2): qty 0.2

## 2015-09-08 MED ORDER — INSULIN ASPART 100 UNIT/ML ~~LOC~~ SOLN
0.0000 [IU] | Freq: Three times a day (TID) | SUBCUTANEOUS | Status: DC
  Administered 2015-09-09: 3 [IU] via SUBCUTANEOUS

## 2015-09-08 MED ORDER — HEPARIN SODIUM (PORCINE) 5000 UNIT/ML IJ SOLN
5000.0000 [IU] | Freq: Three times a day (TID) | INTRAMUSCULAR | Status: DC
  Administered 2015-09-08 – 2015-09-09 (×3): 5000 [IU] via SUBCUTANEOUS
  Filled 2015-09-08 (×3): qty 1

## 2015-09-08 MED ORDER — METOPROLOL TARTRATE 50 MG PO TABS
50.0000 mg | ORAL_TABLET | Freq: Two times a day (BID) | ORAL | Status: DC
  Administered 2015-09-08 – 2015-09-09 (×2): 50 mg via ORAL
  Filled 2015-09-08 (×2): qty 1

## 2015-09-08 MED ORDER — STROKE: EARLY STAGES OF RECOVERY BOOK
Freq: Once | Status: AC
  Administered 2015-09-08: 21:00:00

## 2015-09-08 MED ORDER — FAMOTIDINE 20 MG PO TABS
40.0000 mg | ORAL_TABLET | Freq: Every day | ORAL | Status: DC
  Administered 2015-09-09: 40 mg via ORAL
  Filled 2015-09-08: qty 2

## 2015-09-08 MED ORDER — PRAVASTATIN SODIUM 10 MG PO TABS
10.0000 mg | ORAL_TABLET | Freq: Every day | ORAL | Status: DC
  Administered 2015-09-08: 22:00:00 10 mg via ORAL
  Filled 2015-09-08: qty 1

## 2015-09-08 MED ORDER — LINAGLIPTIN 5 MG PO TABS
5.0000 mg | ORAL_TABLET | Freq: Every day | ORAL | Status: DC
  Administered 2015-09-09: 11:00:00 5 mg via ORAL
  Filled 2015-09-08: qty 1

## 2015-09-08 MED ORDER — PANTOPRAZOLE SODIUM 40 MG PO TBEC
40.0000 mg | DELAYED_RELEASE_TABLET | Freq: Every day | ORAL | Status: DC
  Administered 2015-09-09: 11:00:00 40 mg via ORAL
  Filled 2015-09-08: qty 1

## 2015-09-08 MED ORDER — ASPIRIN 81 MG PO CHEW
324.0000 mg | CHEWABLE_TABLET | Freq: Once | ORAL | Status: AC
  Administered 2015-09-08: 324 mg via ORAL
  Filled 2015-09-08: qty 4

## 2015-09-08 MED ORDER — DUTASTERIDE 0.5 MG PO CAPS
0.5000 mg | ORAL_CAPSULE | Freq: Every day | ORAL | Status: DC
  Administered 2015-09-09: 0.5 mg via ORAL
  Filled 2015-09-08 (×2): qty 1

## 2015-09-08 MED ORDER — LORATADINE 10 MG PO TABS
10.0000 mg | ORAL_TABLET | Freq: Every day | ORAL | Status: DC
  Administered 2015-09-09: 11:00:00 10 mg via ORAL
  Filled 2015-09-08: qty 1

## 2015-09-08 MED ORDER — OXYCODONE HCL ER 10 MG PO T12A
40.0000 mg | EXTENDED_RELEASE_TABLET | Freq: Three times a day (TID) | ORAL | Status: DC
  Administered 2015-09-08 – 2015-09-09 (×3): 40 mg via ORAL
  Filled 2015-09-08 (×3): qty 4

## 2015-09-08 MED ORDER — DIAZEPAM 2 MG PO TABS: 2.0000 mg | ORAL_TABLET | Freq: Three times a day (TID) | ORAL | Status: DC | PRN

## 2015-09-08 MED ORDER — SODIUM CHLORIDE 0.9 % IV SOLN
INTRAVENOUS | Status: DC
  Administered 2015-09-08 – 2015-09-09 (×2): via INTRAVENOUS

## 2015-09-08 MED ORDER — ASPIRIN 325 MG PO TABS
325.0000 mg | ORAL_TABLET | Freq: Every day | ORAL | Status: DC
  Administered 2015-09-09: 325 mg via ORAL
  Filled 2015-09-08: qty 1

## 2015-09-08 MED ORDER — INSULIN ASPART 100 UNIT/ML ~~LOC~~ SOLN: 5.0000 [IU] | Freq: Every evening | SUBCUTANEOUS | Status: DC

## 2015-09-08 MED ORDER — AMIODARONE HCL 200 MG PO TABS
200.0000 mg | ORAL_TABLET | Freq: Every day | ORAL | Status: DC
  Administered 2015-09-09: 200 mg via ORAL
  Filled 2015-09-08: qty 1

## 2015-09-08 MED ORDER — OXYCODONE HCL 5 MG PO TABS: 10.0000 mg | ORAL_TABLET | ORAL | Status: DC | PRN

## 2015-09-08 MED ORDER — FENOFIBRATE 54 MG PO TABS
54.0000 mg | ORAL_TABLET | Freq: Every day | ORAL | Status: DC
  Administered 2015-09-09: 54 mg via ORAL
  Filled 2015-09-08: qty 1

## 2015-09-08 MED ORDER — FLUTICASONE PROPIONATE 50 MCG/ACT NA SUSP
2.0000 | Freq: Every day | NASAL | Status: DC
  Filled 2015-09-08 (×2): qty 16

## 2015-09-08 MED ORDER — INSULIN ASPART 100 UNIT/ML ~~LOC~~ SOLN
0.0000 [IU] | Freq: Every day | SUBCUTANEOUS | Status: DC
  Administered 2015-09-08: 22:00:00 2 [IU] via SUBCUTANEOUS
  Filled 2015-09-08: qty 2

## 2015-09-08 MED ORDER — AMITRIPTYLINE HCL 25 MG PO TABS
25.0000 mg | ORAL_TABLET | Freq: Every day | ORAL | Status: DC
  Administered 2015-09-08: 25 mg via ORAL
  Filled 2015-09-08: qty 1

## 2015-09-08 MED ORDER — ASPIRIN 300 MG RE SUPP: 300.0000 mg | Freq: Every day | RECTAL | Status: DC

## 2015-09-08 MED ORDER — TESTOSTERONE CYPIONATE 200 MG/ML IM SOLN: 200.0000 mg | INTRAMUSCULAR | Status: DC

## 2015-09-08 MED ORDER — GABAPENTIN 800 MG PO TABS
1600.0000 mg | ORAL_TABLET | Freq: Two times a day (BID) | ORAL | Status: DC
  Administered 2015-09-08: 1600 mg via ORAL
  Filled 2015-09-08 (×3): qty 2

## 2015-09-08 MED ORDER — SENNOSIDES-DOCUSATE SODIUM 8.6-50 MG PO TABS: 1.0000 | ORAL_TABLET | Freq: Every evening | ORAL | Status: DC | PRN

## 2015-09-08 NOTE — ED Notes (Signed)
Pt noted to drop SAT to 80% with sleep, wife states patient has history of sleep apnea but has not been provided with BIPAP machine yet

## 2015-09-08 NOTE — ED Notes (Signed)
Per patient had dizzyness today since 7 am, had episode of weakness and diaphoresis after shower, denies chest pain or SOB

## 2015-09-08 NOTE — H&P (Signed)
[facility] [place] NAME: Jerry Parsons    MR#:  [id number]  DATE OF BIRTH:  1951-11-29  DATE OF ADMISSION:  09/08/2015  PRIMARY CARE PHYSICIAN: [name], MD   REQUESTING/REFERRING PHYSICIAN: Ahmed Prima, MD  CHIEF COMPLAINT:   Chief Complaint  Patient presents with  . Dizziness    states has been dizzy since 7 am this am, fell at home per wife witnessed by her with no LOC   dizziness, confusion and slurred speech today.  HISTORY OF PRESENT ILLNESS:  Jerry Parsons  is a 64 y.o. male with a known history of hypertension, diabetes, sleep apnea and A. fib. The patient to present to the ED due to episodes of dizziness, weakness, confusion and fall this morning. The patient is alert, awake and oriented in now, but he doesn't know why he is in the hospital now. He feels dizzy and weak but denies any other symptoms. According to his wife, the patient also had slurred speech. The patient had arthroscopic knee surgery 9 days ago. PAST MEDICAL HISTORY:   Past Medical History  Diagnosis Date  . Hypertension   . Arrhythmia     tachycardia  . DVT (deep venous thrombosis) 02/2010  . Diabetes mellitus   . Pulmonary embolus   . Kidney failure   . Sleep apnea   . Neuropathy   . GERD (gastroesophageal reflux disease)   . Depression   . BPH (benign prostatic hyperplasia)   . 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     x2    PAST SURGICAL HISTORY:   Past Surgical History  Procedure Laterality Date  . Gallbladder surgery  2002  . Rotator cuff repair  2001  . Back surgery      x 8; upper x 3 & lower x 5  . Cardioversion      SOCIAL HISTORY:   Social History  Substance Use Topics  . Smoking status: Former Smoker -- 1.00 packs/day for 25 years    Types: Cigarettes    Quit date: 12/26/1989  . Smokeless tobacco: Never Used  . Alcohol Use: No    FAMILY HISTORY:   Family History  Problem Relation Age of Onset  .  Heart attack Brother     DRUG ALLERGIES:   Allergies  Allergen Reactions  . Carisoprodol Other (See Comments)    Reaction:  Unknown     REVIEW OF SYSTEMS:  CONSTITUTIONAL: No fever,dizziness and generalized weakness.  EYES: No blurred or double vision.  EARS, NOSE, AND THROAT: No tinnitus or ear pain.  RESPIRATORY: No cough, shortness of breath, wheezing or hemoptysis.  CARDIOVASCULAR: No chest pain, orthopnea, edema.  GASTROINTESTINAL: No nausea, vomiting, diarrhea or abdominal pain.  GENITOURINARY: No dysuria, hematuria.  ENDOCRINE: No polyuria, nocturia,  HEMATOLOGY: No anemia, easy bruising or bleeding SKIN: No rash or lesion. MUSCULOSKELETAL: No joint pain or arthritis.   NEUROLOGIC: No tingling, numbness, weakness. Slurred speech that no dysphagia or incontinence. No syncope or seizure.  PSYCHIATRY: No anxiety or depression.   MEDICATIONS AT HOME:   Prior to Admission medications   Medication Sig Start Date End Date Taking? Authorizing Provider  amiodarone (PACERONE) 200 MG tablet Take 1 tablet (200 mg total) by mouth daily. 02/06/15  Yes Minna Merritts, MD  amitriptyline (ELAVIL) 25 MG tablet Take 25 mg by mouth at bedtime.   Yes Historical Provider, MD  aspirin EC 81 MG tablet Take 81 mg by mouth at  bedtime.   Yes Historical Provider, MD  budesonide (RHINOCORT AQUA) 32 MCG/ACT nasal spray Place 1 spray into the nose daily as needed for rhinitis.    Yes Historical Provider, MD  cyclobenzaprine (FLEXERIL) 10 MG tablet Take 10 mg by mouth 2 (two) times daily.    Yes Historical Provider, MD  diazepam (VALIUM) 2 MG tablet Take 2 mg by mouth 3 (three) times daily as needed for anxiety.   Yes Historical Provider, MD  dutasteride (AVODART) 0.5 MG capsule Take 0.5 mg by mouth daily.     Yes Historical Provider, MD  esomeprazole (NEXIUM) 40 MG capsule Take 40 mg by mouth 2 (two) times daily.    Yes Historical Provider, MD  fenofibrate (TRICOR) 145 MG tablet Take 145 mg by mouth  at bedtime.   Yes Historical Provider, MD  fexofenadine (ALLEGRA) 180 MG tablet Take 180 mg by mouth daily.     Yes Historical Provider, MD  furosemide (LASIX) 40 MG tablet Take 40 mg by mouth daily.   Yes Historical Provider, MD  gabapentin (NEURONTIN) 800 MG tablet Take 1,600 mg by mouth 2 (two) times daily.    Yes Historical Provider, MD  hydrochlorothiazide (HYDRODIURIL) 12.5 MG tablet Take 12.5 mg by mouth at bedtime.   Yes Historical Provider, MD  insulin aspart (NOVOLOG) 100 UNIT/ML injection Inject 5 Units into the skin every evening.   Yes Historical Provider, MD  insulin glargine (LANTUS) 100 UNIT/ML injection Inject 20 Units into the skin at bedtime.   Yes Historical Provider, MD  Liraglutide (VICTOZA) 18 MG/3ML SOPN Inject 1.8 mg into the skin daily.   Yes Historical Provider, MD  metFORMIN (GLUCOPHAGE) 1000 MG tablet Take 1 tablet (1,000 mg total) by mouth 2 (two) times daily. 07/24/15  Yes Richard Maceo Pro., MD  metoprolol (LOPRESSOR) 50 MG tablet Take 1 tablet (50 mg total) by mouth 2 (two) times daily. 07/07/15  Yes Minna Merritts, MD  Multiple Vitamin (MULTIVITAMIN WITH MINERALS) TABS tablet Take 1 tablet by mouth daily.   Yes Historical Provider, MD  Omega-3 Fatty Acids (FISH OIL) 1200 MG CAPS Take 1,200 mg by mouth 2 (two) times daily.   Yes Historical Provider, MD  OxyCODONE (OXYCONTIN) 40 mg T12A 12 hr tablet Take 1 tablet (40 mg total) by mouth every 4 (four) hours as needed. Patient taking differently: Take 40 mg by mouth every 8 (eight) hours.  07/11/15  Yes Richard Maceo Pro., MD  Oxycodone HCl 10 MG TABS Take 10 mg by mouth 3 (three) times daily as needed (for pain).   Yes Historical Provider, MD  pravastatin (PRAVACHOL) 10 MG tablet Take 10 mg by mouth at bedtime.    Yes Historical Provider, MD  ranitidine (ZANTAC) 300 MG tablet Take 300 mg by mouth daily.   Yes Historical Provider, MD  sitaGLIPtan (JANUVIA) 100 MG tablet Take 100 mg by mouth daily.     Yes  Historical Provider, MD  testosterone cypionate (DEPOTESTOSTERONE CYPIONATE) 200 MG/ML injection Inject 200 mg into the muscle every 14 (fourteen) days.   Yes Historical Provider, MD  Oxycodone HCl 20 MG TABS Take 1 tablet (20 mg total) by mouth every 4 (four) hours as needed. Patient not taking: Reported on 09/08/2015 07/28/15   Jerrol Banana., MD      VITAL SIGNS:  Blood pressure 113/67, pulse 70, temperature 97.9 F (36.6 C), temperature source Oral, resp. rate 18, height 5\' 11"  (1.803 m), weight 113.399 kg (250 lb), SpO2 98 %.  PHYSICAL EXAMINATION:  GENERAL:  64 y.o.-year-old patient lying in the bed with no acute distress.  EYES: Pupils equal, round, reactive to light and accommodation. No scleral icterus. Extraocular muscles intact.  HEENT: Head atraumatic, normocephalic. Oropharynx and nasopharynx clear.  NECK:  Supple, no jugular venous distention. No thyroid enlargement, no tenderness.  LUNGS: Normal breath sounds bilaterally, no wheezing, rales,rhonchi or crepitation. No use of accessory muscles of respiration.  CARDIOVASCULAR: S1, S2 normal. No murmurs, rubs, or gallops.  ABDOMEN: Soft, nontender, nondistended. Bowel sounds present. No organomegaly or mass.  EXTREMITIES: No pedal edema, cyanosis, or clubbing.  NEUROLOGIC: Cranial nerves II through XII are intact. Muscle strength 3/5 in all extremities. Sensation decreased in right lower extremity. Gait not checked.  PSYCHIATRIC: The patient is alert and oriented x 3.  SKIN: No obvious rash, lesion, or ulcer.   LABORATORY PANEL:   CBC  Recent Labs Lab 09/08/15 1754  WBC 5.9  HGB 12.8*  HCT 37.3*  PLT 127*   ------------------------------------------------------------------------------------------------------------------  Chemistries   Recent Labs Lab 09/08/15 1754  NA 135  K 3.8  CL 96*  CO2 30  GLUCOSE 299*  BUN 16  CREATININE 1.34*  CALCIUM 9.7  AST 31  ALT 23  ALKPHOS 58  BILITOT 0.6    ------------------------------------------------------------------------------------------------------------------  Cardiac Enzymes  Recent Labs Lab 09/08/15 1754  TROPONINI <0.03   ------------------------------------------------------------------------------------------------------------------  RADIOLOGY:  Ct Head Wo Contrast  09/08/2015   CLINICAL DATA:  Initial encounter for dizziness since 7 a.m. this morning with an episode weakness. Patient fell this afternoon and struck back of head.  EXAM: CT HEAD WITHOUT CONTRAST  TECHNIQUE: Contiguous axial images were obtained from the base of the skull through the vertex without intravenous contrast.  COMPARISON:  Brain MRI from 02/28/2010.  CT head from 02/27/2010.  FINDINGS: No evidence for acute hemorrhage, hydrocephalus, mass lesion, or abnormal extra-axial fluid collection. No definite CT evidence for acute ischemia. Diffuse loss of parenchymal volume is consistent with atrophy. Focal volume loss with associated chronic cortical calcification identified in the left posterior parietal region, unchanged in the interval. This is probably related to a remote ischemic event. The visualized paranasal sinuses and mastoid air cells are clear.  IMPRESSION: Stable exam.  No new or acute intracranial abnormality.  Parenchymal calcification in the left parietal region may reflect previous infarct.  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   Electronically Signed   By: Misty Stanley M.D.   On: 09/08/2015 19:01   Dg Chest Port 1 View  09/08/2015   CLINICAL DATA:  Dizziness since 7 a.m. weakness and diaphoresis after shower.  EXAM: PORTABLE CHEST - 1 VIEW  COMPARISON:  08/15/2015  FINDINGS: Lower cervical plate and screw fixator. Heart size within normal limits for technique. Mild atherosclerotic calcification of the aortic arch. Postoperative findings along the left AC joint.  Thoracic spondylosis.  The lungs appear clear.   IMPRESSION: 1. No acute thoracic findings to explain the patient's symptoms.   Electronically Signed   By: Van Clines M.D.   On: 09/08/2015 19:03    EKG:   Orders placed or performed during the hospital encounter of 09/08/15  . ED EKG  . ED EKG    IMPRESSION AND PLAN:   Altered mental status, possible due to polypharmacy Slurred speech, need to rule out CVA Dehydration Hypertension Diabetes History of PE and DVT  Patient will be placed for observation. He was treated with aspirin 325 mg 1 dose in the ED.  I will continue aspirin and statin, get MRI of the brain, echocardiogram and carotid duplex. In addition I will get speech study, PT and OT. I will hold oxycodone and Flexeril due to altered mental status. I will start IV fluid support and follow-up BMP. Diabetes, start sliding scale and continue Lantus.  All the records are reviewed and case discussed with ED provider. Management plans discussed with the patient, his wife and daughter and they are in agreement.  CODE STATUS: Full code  TOTAL TIME TAKING CARE OF THIS PATIENT: 58 minutes.    Demetrios Loll M.D on 09/08/2015 at 9:37 PM  Between 7am to 6pm - Pager - [phone]  After 6pm go to [email or web address] - password [email or web address] [facility]  Office  [phone]  CC: Primary care physician; [name], MD

## 2015-09-08 NOTE — ED Provider Notes (Signed)
[facility] Emergency Department Provider Note  ____________________________________________  Time seen: 1800  I have reviewed the triage vital signs and the nursing notes.   HISTORY  Chief Complaint Dizziness     HPI PREVIN JIAN is a 64 y.o. male with a complicated history who recently had arthroscopic knee surgery on the left who presents to the emergency department due to episodes of weakness, confusion, dizziness, and a fall this morning.  The history is primarily from his wife. The daughter and the patient contribute some information. The wife is primarily focused on the events of today. She reports that he walked to the mailbox with her, which was impressive an improvement over what he's been able to do, but when they return back to the house, he appeared altered, confused, and was not speaking intelligibly. He went into the bedroom and then fell over area she was able to get him up and place him in a recliner. She checked his blood sugar and found it to be 365, which is unusually high for him per the wife.   She reports that he has acted like this before, but only when he had pneumonia or when he had one of his blood clots in the legs or chest.  He denies any chest pain or shortness of breath. He is not tachycardic.  The patient does report that he had dizziness beginning yesterday. The daughter adds that some of his speech was abnormal yesterday as well.    Past Medical History  Diagnosis Date  . Hypertension   . Arrhythmia     tachycardia  . DVT (deep venous thrombosis) 02/2010  . Diabetes mellitus   . Pulmonary embolus   . Kidney failure   . Sleep apnea   . Neuropathy   . GERD (gastroesophageal reflux disease)   . Depression   . BPH (benign prostatic hyperplasia)   . 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     x2    Patient Active Problem List   Diagnosis Date Noted  . CVA (cerebral infarction) 09/08/2015  . AF (paroxysmal atrial  fibrillation) 07/04/2015  . Aspiration pneumonia 07/04/2015  . Arthritis of knee, degenerative 05/28/2015  . Chronic back pain 09/17/2014  . Leg edema 05/07/2014  . Chronic diastolic CHF (congestive heart failure) 05/07/2014  . Campylobacter diarrhea 04/25/2013  . Atrial flutter 01/23/2013  . Obesity 05/19/2012  . Hyperlipidemia 11/11/2011  . Diastolic dysfunction 16/96/7893  . SOB (shortness of breath) 04/12/2011  . Diabetes mellitus type 2, uncontrolled, with complications 81/12/7508  . HYPERTENSION, BENIGN 03/15/2011  . DVT 03/15/2011  . TACHYCARDIA 03/15/2011    Past Surgical History  Procedure Laterality Date  . Gallbladder surgery  2002  . Rotator cuff repair  2001  . Back surgery      x 8; upper x 3 & lower x 5  . Cardioversion      Current Outpatient Rx  Name  Route  Sig  Dispense  Refill  . amiodarone (PACERONE) 200 MG tablet   Oral   Take 1 tablet (200 mg total) by mouth daily.   90 tablet   3   . amitriptyline (ELAVIL) 25 MG tablet   Oral   Take 25 mg by mouth at bedtime.         [name] aspirin 81 MG tablet   Oral   Take 81 mg by mouth daily.          . budesonide (RHINOCORT AQUA) 32 MCG/ACT nasal spray  Nasal   Place 1 spray into the nose daily as needed.          . cyclobenzaprine (FLEXERIL) 10 MG tablet   Oral   Take 10 mg by mouth 2 (two) times daily.          . diazepam (VALIUM) 2 MG tablet      TAKE 1 TABLET BY MOUTH 3 TIMES A DAY AS NEEDED   90 tablet   1     Not to exceed 2 additional fills before 10/12/2015   . dutasteride (AVODART) 0.5 MG capsule   Oral   Take 0.5 mg by mouth daily.           [name] esomeprazole (NEXIUM) 40 MG capsule   Oral   Take 40 mg by mouth 2 (two) times daily.          . fenofibrate (TRICOR) 145 MG tablet   Oral   Take 145 mg by mouth daily.      12   . fexofenadine (ALLEGRA) 180 MG tablet   Oral   Take 180 mg by mouth daily.           . furosemide (LASIX) 40 MG tablet      TAKE 2 TABLETS  BY MOUTH DAILY   180 tablet   4   . gabapentin (NEURONTIN) 800 MG tablet   Oral   Take 800 mg by mouth 3 (three) times daily.          . hydrochlorothiazide (HYDRODIURIL) 25 MG tablet   Oral   Take 1 tablet (25 mg total) by mouth daily. Patient taking differently: Take 12.5 mg by mouth daily.    90 tablet   3   . Insulin Glargine (LANTUS SOLOSTAR [place])   Subcutaneous   Inject 20 Units into the skin daily.          . Liraglutide (VICTOZA [place])      1.2 once in the am.         . Liraglutide 18 MG/3ML SOPN   Subcutaneous   Inject into the skin.         . metFORMIN (GLUCOPHAGE) 1000 MG tablet   Oral   Take 1 tablet (1,000 mg total) by mouth 2 (two) times daily.   60 tablet   12   . metoprolol (LOPRESSOR) 50 MG tablet   Oral   Take 1 tablet (50 mg total) by mouth 2 (two) times daily.   180 tablet   3   . Multiple Vitamins-Minerals (CENTRUM SILVER ULTRA MENS PO)   Oral   Take by mouth daily.         . NON FORMULARY      Depro shot one every 2 weeks         . NOVOLOG FLEXPEN 100 UNIT/ML FlexPen                 Dispense as written.   [name] NOVOTWIST 32G X 5 MM MISC      USE TWICE DAILY AS DIRECTED   200 each   2   . Omega-3 Fatty Acids (FISH OIL) 1200 MG CAPS   Oral   Take 1,200 mg by mouth 2 (two) times daily.         . ONE TOUCH ULTRA TEST test strip      TEST EVERY DAY      9     Dispense as written.   [name] oxyCODONE (OXYCONTIN) 40 MG 12 hr tablet  Oral   Take 40 mg by mouth 3 (three) times daily.           . OxyCODONE (OXYCONTIN) 40 mg T12A 12 hr tablet   Oral   Take 1 tablet (40 mg total) by mouth every 4 (four) hours as needed.   270 tablet   0   . Oxycodone HCl 10 MG TABS   Oral   Take 10 mg by mouth as needed.         . Oxycodone HCl 20 MG TABS   Oral   Take 1 tablet (20 mg total) by mouth every 4 (four) hours as needed.   180 tablet   0   . pravastatin (PRAVACHOL) 10 MG tablet   Oral   Take 10 mg by mouth daily.          . ranitidine (ZANTAC) 300 MG capsule               . ranitidine (ZANTAC) 300 MG tablet   Oral   Take 300 mg by mouth at bedtime.           . sitaGLIPtan (JANUVIA) 100 MG tablet   Oral   Take 100 mg by mouth daily.           [name] testosterone cypionate (DEPOTESTOTERONE CYPIONATE) 200 MG/ML injection                 Allergies Carisoprodol  Family History  Problem Relation Age of Onset  . Heart attack Brother     Social History Social History  Substance Use Topics  . Smoking status: Former Smoker -- 1.00 packs/day for 25 years    Types: Cigarettes    Quit date: 12/26/1989  . Smokeless tobacco: Never Used  . Alcohol Use: No    Review of Systems  Constitutional: Negative for fever. ENT: Negative for sore throat. Cardiovascular: Negative for chest pain. Respiratory: Negative for shortness of breath. Gastrointestinal: Negative for abdominal pain, vomiting and diarrhea. Genitourinary: Negative for dysuria. Positive for decreased urine output. Musculoskeletal: Recent arthroscopic surgery of left knee.  Skin: Negative for rash. Neurological: Negative for headaches. Notable for confusion, weakness, dizziness, off balance.   10-point ROS otherwise negative.  ____________________________________________   PHYSICAL EXAM:  VITAL SIGNS: ED Triage Vitals  Enc Vitals Group     BP 09/08/15 1734 127/77 mmHg     Pulse Rate 09/08/15 1734 80     Resp 09/08/15 1734 18     Temp 09/08/15 1734 98 F (36.7 C)     Temp Source 09/08/15 1734 Oral     SpO2 09/08/15 1734 96 %     Weight 09/08/15 1734 250 lb (113.399 kg)     Height 09/08/15 1734 5\' 11"  (1.803 m)     Head Cir --      Peak Flow --      Pain Score --      Pain Loc --      Pain Edu? --      Excl. in [place]? --     Constitutional: Alert  ENT   Head: Normocephalic and atraumatic.   Nose: No congestion/rhinnorhea.   Mouth/Throat: Mucous membranes are moist. Cardiovascular: Normal rate,  regular rhythm, no murmur noted Respiratory:  Normal respiratory effort, no tachypnea.    Breath sounds are clear and equal bilaterally.  Gastrointestinal: Soft and nontender. No distention.  Back: No muscle spasm, no tenderness, no CVA tenderness. Musculoskeletal: No deformity noted. Nontender with normal range of motion in  all extremities.  No noted edema. Neurologic:  Normal speech and language. No gross focal neurologic deficits are appreciated.  Skin:  Skin is warm, dry. No rash noted. Psychiatric: Mood and affect are normal. Speech and behavior are normal.  ____________________________________________    LABS (pertinent positives/negatives)  Labs Reviewed  CBC - Abnormal; Notable for the following:    RBC 4.19 (*)    Hemoglobin 12.8 (*)    HCT 37.3 (*)    Platelets 127 (*)    All other components within normal limits  COMPREHENSIVE METABOLIC PANEL - Abnormal; Notable for the following:    Chloride 96 (*)    Glucose, Bld 299 (*)    Creatinine, Ser 1.34 (*)    GFR calc non Af Amer 54 (*)    All other components within normal limits  URINALYSIS COMPLETEWITH MICROSCOPIC ([facility] ONLY) - Abnormal; Notable for the following:    Color, Urine YELLOW (*)    APPearance CLEAR (*)    Glucose, UA >500 (*)    Squamous Epithelial / LPF 0-5 (*)    All other components within normal limits  TROPONIN I     ____________________________________________   EKG  ED ECG REPORT I, Eero Dini W, the attending physician, personally viewed and interpreted this ECG.   Date: 09/08/2015  EKG Time: 1749  Rate: 80  Rhythm: Normal sinus rhythm  Axis: Normal  Intervals: Normal  ST&T Change: Flat T-wave in lead 3 and aVL   ____________________________________________    RADIOLOGY  CT head: IMPRESSION: Stable exam. No new or acute intracranial abnormality.  Parenchymal calcification in the left parietal region may reflect previous infarct.  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  Chest x-ray: IMPRESSION: 1. No acute thoracic findings to explain the patient's symptoms.  ____________________________________________  INITIAL IMPRESSION / ASSESSMENT AND PLAN / ED COURSE  Pertinent labs & imaging results that were available during my care of the patient were reviewed by me and considered in my medical decision making (see chart for details).  Patient with an odd constellation of symptoms that are worrisome for possible CVA. This includes dizziness, slurred speech, impaired speech, and falling.  ----------------------------------------- 7:32 PM on 09/08/2015 -----------------------------------------  Labs and imaging reviewed. CT scan of head shows no acute intracranial abnormality.  Given the set of symptoms, I have spoke with Dr. Bridgett Larsson for admission to the hospital.   ____________________________________________   FINAL CLINICAL IMPRESSION(S) / ED DIAGNOSES  Final diagnoses:  Dizziness  Altered mental status, unspecified altered mental status type  Cerebral infarction due to unspecified mechanism       Ahmed Prima, MD 09/08/15 2003

## 2015-09-09 ENCOUNTER — Observation Stay: Payer: Medicare Other

## 2015-09-09 ENCOUNTER — Observation Stay (HOSPITAL_BASED_OUTPATIENT_CLINIC_OR_DEPARTMENT_OTHER)
Admit: 2015-09-09 | Discharge: 2015-09-09 | Disposition: A | Discharge status: Home / Self Care | Payer: Medicare Other | Attending: Internal Medicine | Admitting: Internal Medicine

## 2015-09-09 DIAGNOSIS — R4781 Slurred speech: Secondary | ICD-10-CM | POA: Diagnosis not present

## 2015-09-09 DIAGNOSIS — I639 Cerebral infarction, unspecified: Secondary | ICD-10-CM

## 2015-09-09 DIAGNOSIS — R42 Dizziness and giddiness: Secondary | ICD-10-CM | POA: Diagnosis not present

## 2015-09-09 DIAGNOSIS — R4182 Altered mental status, unspecified: Secondary | ICD-10-CM | POA: Diagnosis not present

## 2015-09-09 DIAGNOSIS — I10 Essential (primary) hypertension: Secondary | ICD-10-CM | POA: Diagnosis not present

## 2015-09-09 DIAGNOSIS — G459 Transient cerebral ischemic attack, unspecified: Secondary | ICD-10-CM | POA: Diagnosis not present

## 2015-09-09 DIAGNOSIS — E860 Dehydration: Secondary | ICD-10-CM | POA: Diagnosis not present

## 2015-09-09 DIAGNOSIS — R410 Disorientation, unspecified: Secondary | ICD-10-CM | POA: Diagnosis not present

## 2015-09-09 DIAGNOSIS — I63239 Cerebral infarction due to unspecified occlusion or stenosis of unspecified carotid arteries: Secondary | ICD-10-CM | POA: Diagnosis not present

## 2015-09-09 LAB — LIPID PANEL
Cholesterol: 192 mg/dL (ref 0–200)
HDL: 29 mg/dL — ABNORMAL LOW (ref >40)
LDL Cholesterol: UNDETERMINED mg/dL (ref 0–99)
Total CHOL/HDL Ratio: 6.6 RATIO
Triglycerides: 519 mg/dL — ABNORMAL HIGH (ref <150)
VLDL: UNDETERMINED mg/dL (ref 0–40)

## 2015-09-09 LAB — GLUCOSE, CAPILLARY
Glucose-Capillary: 210 mg/dL — ABNORMAL HIGH (ref 65–99)
Glucose-Capillary: 216 mg/dL — ABNORMAL HIGH (ref 65–99)

## 2015-09-09 LAB — HEMOGLOBIN A1C: Hgb A1c MFr Bld: 7.9 % — ABNORMAL HIGH (ref 4.0–6.0)

## 2015-09-09 MED ORDER — ATORVASTATIN CALCIUM 40 MG PO TABS: 40.0000 mg | ORAL_TABLET | Freq: Every day | ORAL | Status: DC

## 2015-09-09 MED ORDER — CLOPIDOGREL BISULFATE 75 MG PO TABS: 75.0000 mg | ORAL_TABLET | Freq: Every day | ORAL | Status: DC

## 2015-09-09 MED ORDER — CLOPIDOGREL BISULFATE 75 MG PO TABS
75.0000 mg | ORAL_TABLET | Freq: Every day | ORAL | Status: DC
  Administered 2015-09-09: 75 mg via ORAL
  Filled 2015-09-09: qty 1

## 2015-09-09 MED ORDER — FENOFIBRATE 160 MG PO TABS
160.0000 mg | ORAL_TABLET | Freq: Every day | ORAL | Status: DC
  Administered 2015-09-09: 160 mg via ORAL
  Filled 2015-09-09: qty 1

## 2015-09-09 MED ORDER — ATORVASTATIN CALCIUM 20 MG PO TABS: 40.0000 mg | ORAL_TABLET | Freq: Every day | ORAL | Status: DC

## 2015-09-09 MED ORDER — GABAPENTIN 400 MG PO CAPS: 1600.0000 mg | ORAL_CAPSULE | Freq: Two times a day (BID) | ORAL | Status: DC

## 2015-09-09 NOTE — Progress Notes (Signed)
*  PRELIMINARY RESULTS* Echocardiogram 2D Echocardiogram has been performed.  Jerry Parsons 09/09/2015, 9:13 AM

## 2015-09-09 NOTE — Discharge Summary (Signed)
[facility] [place] NAME: Jerry Parsons    MR#:  [id number]  DATE OF BIRTH:  29-Jul-1951  DATE OF ADMISSION:  09/08/2015 ADMITTING PHYSICIAN: Demetrios Loll, MD  DATE OF DISCHARGE:09/09/2015  PRIMARY CARE PHYSICIAN: [name], MD    ADMISSION DIAGNOSIS:  Dizziness [R42] Cerebral infarction due to unspecified mechanism [I63.9] Altered mental status, unspecified altered mental status type [R41.82]  DISCHARGE DIAGNOSIS:  Principal Problem:   TIA- no Finding of stroke on MRI Active Problems:   Dehydration   SECONDARY DIAGNOSIS:   Past Medical History  Diagnosis Date  . Hypertension   . Arrhythmia     tachycardia  . DVT (deep venous thrombosis) 02/2010  . Diabetes mellitus   . Pulmonary embolus   . Kidney failure   . Sleep apnea   . Neuropathy   . GERD (gastroesophageal reflux disease)   . Depression   . BPH (benign prostatic hyperplasia)   . 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   Added plavix as she was taking ASA. Increased dose of lipitor.    We continued her other baseline medications as he was taking at home   Physical therapy evaluation was done and he was at his baseline walking with a walker so no further recommendations were made.   DISCHARGE CONDITIONS:   Stable.  CONSULTS OBTAINED:     DRUG ALLERGIES:   Allergies  Allergen Reactions  . Carisoprodol Other (See Comments)    Reaction:  Unknown     DISCHARGE MEDICATIONS:   Current Discharge Medication List    START taking these medications   Details  atorvastatin (LIPITOR) 40 MG tablet Take 1 tablet (40 mg total) by mouth  daily at 6 PM. Qty: 30 tablet, Refills: 0    clopidogrel (PLAVIX) 75 MG tablet Take 1 tablet (75 mg total) by mouth daily. Qty: 30 tablet, Refills: 0      CONTINUE these medications which have NOT CHANGED   Details  amiodarone (PACERONE) 200 MG tablet Take 1 tablet (200 mg total) by mouth daily. Qty: 90 tablet, Refills: 3    amitriptyline (ELAVIL) 25 MG tablet Take 25 mg by mouth at bedtime.    aspirin EC 81 MG tablet Take 81 mg by mouth at bedtime.    budesonide (RHINOCORT AQUA) 32 MCG/ACT nasal spray Place 1 spray into the nose daily as needed for rhinitis.     cyclobenzaprine (FLEXERIL) 10 MG tablet Take 10 mg by mouth 2 (two) times daily.     diazepam (VALIUM) 2 MG tablet Take 2 mg by mouth 3 (three) times daily as needed for anxiety.    dutasteride (AVODART) 0.5 MG capsule Take 0.5 mg by mouth daily.      esomeprazole (NEXIUM) 40 MG capsule Take 40 mg by mouth 2 (two) times daily.     fenofibrate (TRICOR) 145 MG tablet Take 145 mg by mouth at bedtime.    fexofenadine (ALLEGRA) 180 MG tablet Take 180 mg by mouth daily.      furosemide (LASIX) 40 MG tablet Take 40 mg by mouth daily.    gabapentin (NEURONTIN) 800 MG tablet Take 1,600 mg  by mouth 2 (two) times daily.     hydrochlorothiazide (HYDRODIURIL) 12.5 MG tablet Take 12.5 mg by mouth at bedtime.    insulin aspart (NOVOLOG) 100 UNIT/ML injection Inject 5 Units into the skin every evening.    insulin glargine (LANTUS) 100 UNIT/ML injection Inject 20 Units into the skin at bedtime.    Liraglutide (VICTOZA) 18 MG/3ML SOPN Inject 1.8 mg into the skin daily.    metFORMIN (GLUCOPHAGE) 1000 MG tablet Take 1 tablet (1,000 mg total) by mouth 2 (two) times daily. Qty: 60 tablet, Refills: 12    metoprolol (LOPRESSOR) 50 MG tablet Take 1 tablet (50 mg total) by mouth 2 (two) times daily. Qty: 180 tablet, Refills: 3    Multiple Vitamin (MULTIVITAMIN WITH MINERALS) TABS tablet Take 1 tablet by mouth daily.    Omega-3 Fatty  Acids (FISH OIL) 1200 MG CAPS Take 1,200 mg by mouth 2 (two) times daily.    OxyCODONE (OXYCONTIN) 40 mg T12A 12 hr tablet Take 1 tablet (40 mg total) by mouth every 4 (four) hours as needed. Qty: 270 tablet, Refills: 0    !! Oxycodone HCl 10 MG TABS Take 10 mg by mouth 3 (three) times daily as needed (for pain).    pravastatin (PRAVACHOL) 10 MG tablet Take 10 mg by mouth at bedtime.     ranitidine (ZANTAC) 300 MG tablet Take 300 mg by mouth daily.    sitaGLIPtan (JANUVIA) 100 MG tablet Take 100 mg by mouth daily.      testosterone cypionate (DEPOTESTOSTERONE CYPIONATE) 200 MG/ML injection Inject 200 mg into the muscle every 14 (fourteen) days.    !! Oxycodone HCl 20 MG TABS Take 1 tablet (20 mg total) by mouth every 4 (four) hours as needed. Qty: 180 tablet, Refills: 0   Associated Diagnoses: Chronic back pain     !! - Potential duplicate medications found. Please discuss with provider.       DISCHARGE INSTRUCTIONS:    Follow with PMD in 1 week.  If you experience worsening of your admission symptoms, develop shortness of breath, life threatening emergency, suicidal or homicidal thoughts you must seek medical attention immediately by calling 911 or calling your MD immediately  if symptoms less severe.  You Must read complete instructions/literature along with all the possible adverse reactions/side effects for all the Medicines you take and that have been prescribed to you. Take any new Medicines after you have completely understood and accept all the possible adverse reactions/side effects.   Please note  You were cared for by a hospitalist during your hospital stay. If you have any questions about your discharge medications or the care you received while you were in the hospital after you are discharged, you can call the unit and asked to speak with the hospitalist on call if the hospitalist that took care of you is not available. Once you are discharged, your primary care  physician will handle any further medical issues. Please note that NO REFILLS for any discharge medications will be authorized once you are discharged, as it is imperative that you return to your primary care physician (or establish a relationship with a primary care physician if you do not have one) for your aftercare needs so that they can reassess your need for medications and monitor your lab values.    Today   CHIEF COMPLAINT:   Chief Complaint  Patient presents with  . Dizziness    states has been dizzy since 7 am this am, fell at home  per wife witnessed by her with no LOC    HISTORY OF PRESENT ILLNESS:  Jerry Parsons  is a 64 y.o. male with a known history of hypertension, diabetes, sleep apnea and A. fib. The patient to present to the ED due to episodes of dizziness, weakness, confusion and fall this morning. The patient is alert, awake and oriented in now, but he doesn't know why he is in the hospital now. He feels dizzy and weak but denies any other symptoms. According to his wife, the patient also had slurred speech. The patient had arthroscopic knee surgery 9 days ago.  VITAL SIGNS:  Blood pressure 146/61, pulse 81, temperature 98.8 F (37.1 C), temperature source Oral, resp. rate 18, height 5\' 6"  (1.676 m), weight 101.606 kg (224 lb), SpO2 94 %.  I/O:   Intake/Output Summary (Last 24 hours) at 09/09/15 1446 Last data filed at 09/09/15 1403  Gross per 24 hour  Intake    525 ml  Output    700 ml  Net   -175 ml    PHYSICAL EXAMINATION:   GENERAL: 64 y.o.-year-old patient lying in the bed with no acute distress.  EYES: Pupils equal, round, reactive to light and accommodation. No scleral icterus. Extraocular muscles intact.  HEENT: Head atraumatic, normocephalic. Oropharynx and nasopharynx clear.  NECK: Supple, no jugular venous distention. No thyroid enlargement, no tenderness.  LUNGS: Normal breath sounds bilaterally, no wheezing, rales,rhonchi or crepitation. No  use of accessory muscles of respiration.  CARDIOVASCULAR: S1, S2 normal. No murmurs, rubs, or gallops.  ABDOMEN: Soft, nontender, nondistended. Bowel sounds present. No organomegaly or mass.  EXTREMITIES: No pedal edema, cyanosis, or clubbing.  NEUROLOGIC: Cranial nerves II through XII are intact. Muscle strength 3/5 in all extremities. Sensation decreased in right lower extremity. Gait not checked.  PSYCHIATRIC: The patient is alert and oriented x 3.  SKIN: No obvious rash, lesion, or ulcer.   DATA REVIEW:   CBC  Recent Labs Lab 09/08/15 1754  WBC 5.9  HGB 12.8*  HCT 37.3*  PLT 127*    Chemistries   Recent Labs Lab 09/08/15 1754  NA 135  K 3.8  CL 96*  CO2 30  GLUCOSE 299*  BUN 16  CREATININE 1.34*  CALCIUM 9.7  AST 31  ALT 23  ALKPHOS 58  BILITOT 0.6    Cardiac Enzymes  Recent Labs Lab 09/08/15 1754  TROPONINI <0.03    Microbiology Results  Results for orders placed or performed in visit on 04/11/13  Clostridium Difficile by PCR     Status: None   Collection Time: 04/11/13  4:00 PM  Result Value Ref Range Status   Micro Text Report   Final       COMMENT                   NEGATIVE-CLOS.DIFFICILE TOXIN NOT DETECTED BY PCR   ANTIBIOTIC                                                      Stool culture     Status: None   Collection Time: 04/11/13  4:00 PM  Result Value Ref Range Status   Micro Text Report   Final       COMMENT  POSITIVE CAMPYLOBACTER AG (JEJUNI/COLI)   COMMENT                   NO PATHOGENIC E.COLI DETECTED   COMMENT                   NO SALMONELLA OR SHIGELLA ISOLATED   ANTIBIOTIC                                                        RADIOLOGY:  Ct Head Wo Contrast  09/08/2015   CLINICAL DATA:  Initial encounter for dizziness since 7 a.m. this morning with an episode weakness. Patient fell this afternoon and struck back of head.  EXAM: CT HEAD WITHOUT CONTRAST  TECHNIQUE: Contiguous axial images were  obtained from the base of the skull through the vertex without intravenous contrast.  COMPARISON:  Brain MRI from 02/28/2010.  CT head from 02/27/2010.  FINDINGS: No evidence for acute hemorrhage, hydrocephalus, mass lesion, or abnormal extra-axial fluid collection. No definite CT evidence for acute ischemia. Diffuse loss of parenchymal volume is consistent with atrophy. Focal volume loss with associated chronic cortical calcification identified in the left posterior parietal region, unchanged in the interval. This is probably related to a remote ischemic event. The visualized paranasal sinuses and mastoid air cells are clear.  IMPRESSION: Stable exam.  No new or acute intracranial abnormality.  Parenchymal calcification in the left parietal region may reflect previous infarct.  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   Electronically Signed   By: Misty Stanley M.D.   On: 09/08/2015 19:01   Mr Brain Wo Contrast  09/09/2015   CLINICAL DATA:  64 year old diabetic hypertensive male with dizziness, confusion and slurred speech which started yesterday. Fall. Subsequent encounter.  EXAM: MRI HEAD WITHOUT CONTRAST  MRA HEAD WITHOUT CONTRAST  TECHNIQUE: Multiplanar, multiecho pulse sequences of the brain and surrounding structures were obtained without intravenous contrast. Angiographic images of the head were obtained using MRA technique without contrast.  COMPARISON:  09/08/2015 and 02/27/2010 head CT. 02/28/2010 brain MR.  FINDINGS: MRI HEAD FINDINGS  Exam is motion degraded.  No acute infarct.  Abnormal appearance of the left parietal lobe with cleft like prominent sulcus where there is a serpiginous calcified structure raising possibility of congenital abnormality such as cortical dysplasia with thrombosed draining vein. Calcification associated with remote infarct and subsequent encephalomalacia is a secondary less likely consideration. No arterial feeders to suggest arterial venous  malformation.  Mild global atrophy without hydrocephalus.  No intracranial hemorrhage.  No intracranial mass separate from above described findings.  Polypoid opacification inferior right maxillary sinus.  Prior fusion C3-4.  Spinal stenosis with mild cord flattening C2-3.  Cervical medullary junction, pituitary region, pineal region and orbital structures unremarkable.  MRA HEAD FINDINGS  Exam is motion degraded.  Anterior circulation without medium or large size vessel significant stenosis or occlusion.  Artifact suspected as cause of focal loss of signal proximal A1 segment right anterior cerebral artery.  Mild middle cerebral artery branch vessel irregularity.  Fetal type contribution to the right posterior cerebral artery.  Slight irregularity distal right vertebral artery. Left vertebral artery slightly dominant in size.  Minimal narrowing proximal basilar artery.  Nonvisualized left anterior inferior cerebellar artery.  No aneurysm identified.  No abnormal arterial structures seen in the  left parietal prominent cleft.  IMPRESSION: MRI HEAD  Exam is motion degraded.  No acute infarct.  Abnormal appearance of the left parietal lobe with cleft like prominent sulcus where there is a serpiginous calcified structure raising possibility of congenital abnormality such as cortical dysplasia with thrombosed draining vein. Calcification associated with remote infarct and subsequent encephalomalacia is a secondary less likely consideration. No arterial feeders to suggest arterial venous malformation.  Mild global atrophy without hydrocephalus.  No intracranial hemorrhage.  No intracranial mass separate from above described findings.  Prior fusion C3-4.  Spinal stenosis with mild cord flattening C2-3.  MRA HEAD  No medium or large size vessel significant stenosis or occlusion.  Mild branch vessel atherosclerotic type changes.   Electronically Signed   By: Genia Del M.D.   On: 09/09/2015 10:47   US Carotid  Bilateral  09/09/2015   CLINICAL DATA:  Cerebrovascular accident.  EXAM: BILATERAL CAROTID DUPLEX ULTRASOUND  TECHNIQUE: Pearline Cables scale imaging, color Doppler and duplex ultrasound were performed of bilateral carotid and vertebral arteries in the neck.  COMPARISON:  None.  FINDINGS: Criteria: Quantification of carotid stenosis is based on velocity parameters that correlate the residual internal carotid diameter with NASCET-based stenosis levels, using the diameter of the distal internal carotid lumen as the denominator for stenosis measurement.  The following velocity measurements were obtained:  RIGHT  ICA:  74/23 cm/sec  CCA:  034/74 cm/sec  SYSTOLIC ICA/CCA RATIO:  0.7  DIASTOLIC ICA/CCA RATIO:  1.8  ECA:  122 cm/sec  LEFT  ICA:  75/23 cm/sec  CCA:  25/95 cm/sec  SYSTOLIC ICA/CCA RATIO:  0.8  DIASTOLIC ICA/CCA RATIO:  1.3  ECA:  74 cm/sec  RIGHT CAROTID ARTERY: Mild eccentric plaque formation is noted in the right common carotid artery, with minimal calcified plaque formation noted in the right carotid bulb and proximal right internal carotid artery. This is consistent with less than 50% diameter stenosis based on ultrasound and Doppler criteria.  RIGHT VERTEBRAL ARTERY:  Antegrade flow is noted.  LEFT CAROTID ARTERY: Mild plaque formation is noted in the left common carotid artery and left carotid bulb and proximal left internal carotid artery consistent with less than 50% diameter stenosis based on ultrasound and Doppler criteria.  LEFT VERTEBRAL ARTERY:  Antegrade flow is noted.  IMPRESSION: Minimal plaque formation is noted in the proximal right internal carotid artery consistent with less than 50% diameter stenosis based on ultrasound and Doppler criteria.  Mild plaque formation is noted in the proximal left internal carotid artery consistent with less than 50% diameter stenosis based on ultrasound and Doppler criteria.   Electronically Signed   By: Marijo Conception, M.D.   On: 09/09/2015 09:18   Dg Chest Port  1 View  09/08/2015   CLINICAL DATA:  Dizziness since 7 a.m. weakness and diaphoresis after shower.  EXAM: PORTABLE CHEST - 1 VIEW  COMPARISON:  08/15/2015  FINDINGS: Lower cervical plate and screw fixator. Heart size within normal limits for technique. Mild atherosclerotic calcification of the aortic arch. Postoperative findings along the left AC joint.  Thoracic spondylosis.  The lungs appear clear.  IMPRESSION: 1. No acute thoracic findings to explain the patient's symptoms.   Electronically Signed   By: Van Clines M.D.   On: 09/08/2015 19:03   Mr Mra Head/brain Wo Cm  09/09/2015   CLINICAL DATA:  64 year old diabetic hypertensive male with dizziness, confusion and slurred speech which started yesterday. Fall. Subsequent encounter.  EXAM: MRI HEAD WITHOUT CONTRAST  MRA HEAD WITHOUT CONTRAST  TECHNIQUE: Multiplanar, multiecho pulse sequences of the brain and surrounding structures were obtained without intravenous contrast. Angiographic images of the head were obtained using MRA technique without contrast.  COMPARISON:  09/08/2015 and 02/27/2010 head CT. 02/28/2010 brain MR.  FINDINGS: MRI HEAD FINDINGS  Exam is motion degraded.  No acute infarct.  Abnormal appearance of the left parietal lobe with cleft like prominent sulcus where there is a serpiginous calcified structure raising possibility of congenital abnormality such as cortical dysplasia with thrombosed draining vein. Calcification associated with remote infarct and subsequent encephalomalacia is a secondary less likely consideration. No arterial feeders to suggest arterial venous malformation.  Mild global atrophy without hydrocephalus.  No intracranial hemorrhage.  No intracranial mass separate from above described findings.  Polypoid opacification inferior right maxillary sinus.  Prior fusion C3-4.  Spinal stenosis with mild cord flattening C2-3.  Cervical medullary junction, pituitary region, pineal region and orbital structures  unremarkable.  MRA HEAD FINDINGS  Exam is motion degraded.  Anterior circulation without medium or large size vessel significant stenosis or occlusion.  Artifact suspected as cause of focal loss of signal proximal A1 segment right anterior cerebral artery.  Mild middle cerebral artery branch vessel irregularity.  Fetal type contribution to the right posterior cerebral artery.  Slight irregularity distal right vertebral artery. Left vertebral artery slightly dominant in size.  Minimal narrowing proximal basilar artery.  Nonvisualized left anterior inferior cerebellar artery.  No aneurysm identified.  No abnormal arterial structures seen in the left parietal prominent cleft.  IMPRESSION: MRI HEAD  Exam is motion degraded.  No acute infarct.  Abnormal appearance of the left parietal lobe with cleft like prominent sulcus where there is a serpiginous calcified structure raising possibility of congenital abnormality such as cortical dysplasia with thrombosed draining vein. Calcification associated with remote infarct and subsequent encephalomalacia is a secondary less likely consideration. No arterial feeders to suggest arterial venous malformation.  Mild global atrophy without hydrocephalus.  No intracranial hemorrhage.  No intracranial mass separate from above described findings.  Prior fusion C3-4.  Spinal stenosis with mild cord flattening C2-3.  MRA HEAD  No medium or large size vessel significant stenosis or occlusion.  Mild branch vessel atherosclerotic type changes.   Electronically Signed   By: Genia Del M.D.   On: 09/09/2015 10:47     Management plans discussed with the patient, family and they are in agreement.  CODE STATUS:     Code Status Orders        Start     Ordered   09/08/15 2108  Full code   Continuous     09/08/15 2107    Advance Directive Documentation        Most Recent Value   Type of Advance Directive  Healthcare Power of Attorney, Living will   Pre-existing out of facility  DNR order (yellow form or pink MOST form)     "MOST" Form in Place?        TOTAL TIME TAKING CARE OF THIS PATIENT: 40 minutes.    Vaughan Basta M.D on 09/09/2015 at 2:46 PM  Between 7am to 6pm - Pager - [phone]  After 6pm go to [email or web address] - password [email or web address] [facility]  Office  [phone]  CC: Primary care physician; [name], MD

## 2015-09-09 NOTE — Evaluation (Signed)
Physical Therapy Evaluation Patient Details Name: Jerry Parsons MRN: [id number] DOB: [birth year] Today's Date: 09/09/2015   History of Present Illness  Pt arrived wtih complaints of dizziness, weakness, and confusion. He has been ruled out for CVA. Pt recently underwent L arthroscopic knee surgery (9 days ago). Pt reports one fall immediately prior to arrival as well as additional falls due to L knee buckling. Prior to arrival pt was ambulating with standard walker (only since surgery).  Clinical Impression  Pt reports that his mobility is currently baseline. He had recent L knee surgery and will need referral from orthopedic surgeon for OP PT. Once his L knee PT is completed pt should be reassessed for balance. He has a history of multiple falls and his balance is still impaired today. Neurological exam is WNL today although pt does appear somewhat tremulous during session, an issue which family reports is transient and relatively new (although not new with respect to current admission). Pt will benefit from continued PT while at [facility] for balance, strength, and mobility.     Follow Up Recommendations  (Pending referral from orthopedic surgeon for L knee PT)    Equipment Recommendations   (Needs wheel kit for walker. If unavailable needs RW)    Recommendations for Other Services       Precautions / Restrictions Precautions Precautions: Fall Restrictions Weight Bearing Restrictions: No      Mobility  Bed Mobility Overal bed mobility: Needs Assistance Bed Mobility: Supine to Sit     Supine to sit: Min assist     General bed mobility comments: Pt requires some assist from L sidelying to sitting at EOB  Transfers Overall transfer level: Needs assistance Equipment used: Rolling walker (2 wheeled) Transfers: Sit to/from Stand Sit to Stand: Min assist         General transfer comment: Pt initially with posterior leaning requiring minA. Multiple attempts and pt improves to CGA  only  Ambulation/Gait Ambulation/Gait assistance: Min guard Ambulation Distance (Feet): 80 Feet Assistive device: Rolling walker (2 wheeled) Gait Pattern/deviations: Decreased step length - right;Decreased step length - left   Gait velocity interpretation: <1.8 ft/sec, indicative of risk for recurrent falls General Gait Details: Pt with decreased speed and step length. Overall pt is reasonably steady without overt LOB. Pt able to perform head turns without deviation. Gait speed is decreased but functional for household ambulation  Stairs            Wheelchair Mobility    Modified Rankin (Stroke Patients Only)       Balance Overall balance assessment: Needs assistance   Sitting balance-Leahy Scale: Good       Standing balance-Leahy Scale: Fair   Single Leg Stance - Right Leg: 2       Rhomberg - Eyes Opened: 30 Rhomberg - Eyes Closed: 20   High Level Balance Comments: Poor single leg stance on RLE. Not attempted on LLE due recent surgery             Pertinent Vitals/Pain Pain Assessment: 0-10 Pain Score: 9  Pain Location: L knee, 8/10 low back (chronic) Pain Intervention(s): Monitored during session;Limited activity within patient's tolerance    Home Living Family/patient expects to be discharged to:: Private residence Living Arrangements: Spouse/significant other Available Help at Discharge: Family Type of Home: House Home Access: Stairs to enter Entrance Stairs-Rails:  (Can use door frame) Entrance Stairs-Number of Steps: 1 Home Layout: One level Home Equipment: Walker - standard;Cane - single point;Bedside commode;Wheelchair - Brewing technologist (  No grab bars, sleep number bed with inclines)      Prior Function Level of Independence: Independent with assistive device(s)               Hand Dominance   Dominant Hand: Right    Extremity/Trunk Assessment   Upper Extremity Assessment: Overall WFL for tasks assessed           Lower  Extremity Assessment: Overall WFL for tasks assessed;RLE deficits/detail RLE Deficits / Details: Limited L knee strength testing secondary to recent knee surgery and knee pain. Pt is somewhat tremulous during muscle testing. Family reports intermittent episodes of UE/LE weakness prior to admission. No specific laterality       Communication   Communication: No difficulties  Cognition Arousal/Alertness: Awake/alert Behavior During Therapy: WFL for tasks assessed/performed Overall Cognitive Status: Within Functional Limits for tasks assessed                      General Comments      Exercises        Assessment/Plan    PT Assessment Patient needs continued PT services  PT Diagnosis Difficulty walking;Abnormality of gait;Generalized weakness;Acute pain   PT Problem List Decreased strength;Decreased activity tolerance;Decreased balance;Decreased safety awareness;Pain  PT Treatment Interventions DME instruction;Gait training;Stair training;Functional mobility training;Therapeutic exercise;Therapeutic activities;Balance training;Neuromuscular re-education;Patient/family education   PT Goals (Current goals can be found in the Care Plan section) Acute Rehab PT Goals Patient Stated Goal: "I want to get home" PT Goal Formulation: With patient/family Time For Goal Achievement: 09/23/15 Potential to Achieve Goals: Good    Frequency Min 2X/week   Barriers to discharge        Co-evaluation               End of Session Equipment Utilized During Treatment: Gait belt Activity Tolerance: Patient tolerated treatment well Patient left: in chair;with family/visitor present Nurse Communication:  (Need for insulin)    Functional Assessment Tool Used: Clinical judgement, single leg balance Functional Limitation: Mobility: Walking and moving around Mobility: Walking and Moving Around Current Status (A1287): At least 20 percent but less than 40 percent impaired, limited or  restricted Mobility: Walking and Moving Around Goal Status [phone]): At least 1 percent but less than 20 percent impaired, limited or restricted    Time: 1135-1200 PT Time Calculation (min) (ACUTE ONLY): 25 min   Charges:   PT Evaluation $Initial PT Evaluation Tier I: 1 Procedure     PT G Codes:   PT G-Codes **NOT FOR INPATIENT CLASS** Functional Assessment Tool Used: Clinical judgement, single leg balance Functional Limitation: Mobility: Walking and moving around Mobility: Walking and Moving Around Current Status (M0947): At least 20 percent but less than 40 percent impaired, limited or restricted Mobility: Walking and Moving Around Goal Status [phone]): At least 1 percent but less than 20 percent impaired, limited or restricted   Phillips Grout PT, DPT   Verma Grothaus 09/09/2015, 12:21 PM

## 2015-09-09 NOTE — Progress Notes (Signed)
Discharge instructions went over with patient and wife at bedside. All questions answered. Patient discharged home with wife via wheelchair by volunteer services. Madlyn Frankel, RN

## 2015-09-09 NOTE — Evaluation (Signed)
Clinical/Bedside Swallow Evaluation Patient Details  Name: Jerry Parsons MRN: [id number] Date of Birth: Aug 23, 1951  Today's Date: 09/09/2015 Time: SLP Start Time (ACUTE ONLY): 1100 SLP Stop Time (ACUTE ONLY): 1200 SLP Time Calculation (min) (ACUTE ONLY): 60 min  Past Medical History:  Past Medical History  Diagnosis Date  . Hypertension   . Arrhythmia     tachycardia  . DVT (deep venous thrombosis) 02/2010  . Diabetes mellitus   . Pulmonary embolus   . Kidney failure   . Sleep apnea   . Neuropathy   . GERD (gastroesophageal reflux disease)   . Depression   . BPH (benign prostatic hyperplasia)   . 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     x2   Past Surgical History:  Past Surgical History  Procedure Laterality Date  . Gallbladder surgery  2002  . Rotator cuff repair  2001  . Back surgery      x 8; upper x 3 & lower x 5  . Cardioversion     HPI:  Jerry Parsons is a 64 y.o. male with a known history of hypertension, diabetes, sleep apnea and A. fib. The patient to present to the ED due to episodes of dizziness, weakness, confusion and fall this morning. The patient is alert, awake and oriented in now, but he doesn't know why he is in the hospital now. He feels dizzy and weak but denies any other symptoms. According to his wife, the patient also had slurred speech. The patient had arthroscopic knee surgery 9 days ago.   Assessment / Plan / Recommendation Clinical Impression  Pt had no s/s of aspiration with PO trials and no significant oral phyargnel phase dysphagia at this evaluation. Pt does experience what appears to be dry mouth e/b stringy saliva and intermittant diffuse residue after drier consistencies of food. This appears to lessen when alternating with liquids. Per patient and family report, Pt does experience inconsistent globus sensation with episodes of "getting choked."  Per wife, "if he doesn't have something to drink, he gets choked." Patient has baseline GERD  and takes Nexium (generic) twice daily.  ST suspects globus sensation related to GERD. Rec GI f/u for management. Per chart, patient had episode of slurred speech.  When asked about this, Pt. and family explained episode lasted about 2 hours then cleared up (by time they were in hospital).    Aspiration Risk   (Reduced)    Diet Recommendation Age appropriate regular solids;Thin   Medication Administration: Whole meds with liquid Compensations: Small sips/bites;Slow rate;Follow solids with liquid    Other  Recommendations Recommended Consults: Consider GI evaluation (for reflux management) Oral Care Recommendations: Oral care BID;Patient independent with oral care (and after meals)   Follow Up Recommendations       Frequency and Duration min 2x/week  1 week   Pertinent Vitals/Pain None indicated during eval    SLP Swallow Goals  See care plan   Swallow Study Prior Functional Status   Pt resides at home and was on a regular diet prior to admission; GERD on PPI.    General Date of Onset: 09/08/15 Other Pertinent Information: Jerry Parsons is a 64 y.o. male with a known history of hypertension, diabetes, sleep apnea and A. fib. The patient to present to the ED due to episodes of dizziness, weakness, confusion and fall this morning. The patient is alert, awake and oriented in now, but he doesn't know why he is in the hospital now. He feels dizzy and  weak but denies any other symptoms. According to his wife, the patient also had slurred speech. The patient had arthroscopic knee surgery 9 days ago. Type of Study: Bedside swallow evaluation Diet Prior to this Study: Regular Temperature Spikes Noted: No Respiratory Status: Room air History of Recent Intubation: No Behavior/Cognition: Alert;Cooperative;Pleasant mood Oral Cavity - Dentition: Adequate natural dentition/normal for age;Missing dentition Self-Feeding Abilities: Able to feed self;Needs set up Patient Positioning: Upright in  bed Baseline Vocal Quality: Normal Volitional Cough:  (NT) Volitional Swallow:  (Nt)    Oral/Motor/Sensory Function Overall Oral Motor/Sensory Function: Appears within functional limits for tasks assessed Labial ROM: Within Functional Limits Labial Symmetry: Within Functional Limits Labial Strength:  (NT) Labial Sensation:  (NT) Lingual ROM: Within Functional Limits Lingual Symmetry: Within Functional Limits Lingual Strength:  (NT) Lingual Sensation:  (NT) Facial ROM:  (NT) Facial Symmetry: Within Functional Limits Facial Strength:  (NT) Facial Sensation:  (NT) Velum:  (NT) Mandible: Within Functional Limits   Ice Chips Ice chips: Within functional limits Presentation: Spoon Other Comments: Upon first PO trial of ice chips, pt's mouth appeared sticky with saliva (and dry skin).   Thin Liquid Thin Liquid: Within functional limits Presentation: Straw;Cup Other Comments: Patient took small sips and took 3-4 ounces total between other PO trials. Pt took medication whole with water using a straw/    Nectar Thick Nectar Thick Liquid: Not tested   Honey Thick Honey Thick Liquid: Not tested   Puree Puree: Within functional limits Presentation: Spoon;Self Fed Other Comments: Pt ate a whole vanilla ice cream cup, self fed by spoon.  Also mixed ice cream with cracker.   Solid   GO    Solid: Within functional limits Presentation: Spoon;Self Fed Other Comments: After PO trials of graham cracker (with and without ice cream), pt exhibited intermittent diffuse residue (suspect due to dry mouth). Lessened residue after sipping water.                    Marcelline Temkin 09/09/2015,12:06 PM

## 2015-09-09 NOTE — Progress Notes (Signed)
OT Cancellation Note  Patient Details Name: Jerry Parsons MRN: [id number] DOB: 08-16-1951   Cancelled Treatment:    Reason Eval/Treat Not Completed: Patient at procedure or test/ unavailable Room empty.   Sharon Mt 09/09/2015, 9:58 AM

## 2015-09-09 NOTE — Plan of Care (Signed)
Problem: Discharge/Transitional Outcomes Goal: Barriers To Progression Addressed/Resolved Outcome: Progressing Patient lives at home with wife.  High Fall Risk Pt is blind in R eye, had knee surgery on the left knee last Thursday Hx HTN, DM, DVT, GERD continue on home medications Has sleep apnea, no set up at home yet Goal: Other Discharge Outcomes/Goals Outcome: Progressing Pt is alert and oriented, c/o chronic back pain. Continue pt's home medication scheduled oxycotin with relief. Patient has less sensation on the left leg than right. Unable to lift bilateral legs, drift present in right arm. NS at 75 ml/hr running. Pt has night sweats, not new for patient.

## 2015-09-10 ENCOUNTER — Ambulatory Visit (INDEPENDENT_AMBULATORY_CARE_PROVIDER_SITE_OTHER): Payer: Medicare Other | Admitting: Family Medicine

## 2015-09-10 ENCOUNTER — Ambulatory Visit: Payer: Self-pay | Admitting: Family Medicine

## 2015-09-10 ENCOUNTER — Encounter: Payer: Self-pay | Admitting: Family Medicine

## 2015-09-10 VITALS — BP 138/58 | HR 80 | Temp 98.4°F | Resp 16 | Ht 71.0 in | Wt 221.0 lb

## 2015-09-10 DIAGNOSIS — I639 Cerebral infarction, unspecified: Secondary | ICD-10-CM

## 2015-09-10 DIAGNOSIS — G458 Other transient cerebral ischemic attacks and related syndromes: Secondary | ICD-10-CM

## 2015-09-10 MED ORDER — MAGNESIUM OXIDE 400 MG PO CAPS: 400.0000 mg | ORAL_CAPSULE | Freq: Two times a day (BID) | ORAL | Status: DC

## 2015-09-10 MED ORDER — ATORVASTATIN CALCIUM 40 MG PO TABS: 40.0000 mg | ORAL_TABLET | Freq: Every day | ORAL | Status: DC

## 2015-09-10 NOTE — Progress Notes (Signed)
Patient ID: Jerry Parsons, male   DOB: 02/28/1951, 64 y.o.   MRN: [id number]       [name]    DOB: [birth year]   64 y.o.   MRN: [id number] Visit Date: 09/10/2015  Today's Provider: [name], MD   Chief Complaint  Patient presents with  . Hospitalization Follow-up    Patient was discharged on 09/08/2015.   . Transient Ischemic Attack   Subjective:    HPI Hospitalization Follow up Patient comes in today to be evaluated after his hospital stay on 09/08/2015. Patient reports that he is feeling much better. He still has some generalized weakness, and reports that he has already had his F/U with cardiology. TIA Patient reports that he was started on Plavix and Lipitor while in the hospital. Patient's wife reports that patient has not had another dose since then because she wanted to discuss meds with Provider first.     Allergies  Allergen Reactions  . Carisoprodol Other (See Comments)    Reaction:  Unknown    Previous Medications   AMIODARONE (PACERONE) 200 MG TABLET    Take 1 tablet (200 mg total) by mouth daily.   AMITRIPTYLINE (ELAVIL) 25 MG TABLET    Take 25 mg by mouth at bedtime.   ASPIRIN EC 81 MG TABLET    Take 81 mg by mouth at bedtime.   ATORVASTATIN (LIPITOR) 40 MG TABLET    Take 1 tablet (40 mg total) by mouth daily at 6 PM.   BUDESONIDE (RHINOCORT AQUA) 32 MCG/ACT NASAL SPRAY    Place 1 spray into the nose daily as needed for rhinitis.    CLOPIDOGREL (PLAVIX) 75 MG TABLET    Take 1 tablet (75 mg total) by mouth daily.   CYCLOBENZAPRINE (FLEXERIL) 10 MG TABLET    Take 10 mg by mouth 2 (two) times daily.    DIAZEPAM (VALIUM) 2 MG TABLET    Take 2 mg by mouth 3 (three) times daily as needed for anxiety.   DUTASTERIDE (AVODART) 0.5 MG CAPSULE    Take 0.5 mg by mouth daily.     ESOMEPRAZOLE (NEXIUM) 40 MG CAPSULE    Take 40 mg by mouth 2 (two) times daily.    FENOFIBRATE (TRICOR) 145 MG TABLET    Take 145 mg by mouth at bedtime.   FEXOFENADINE  (ALLEGRA) 180 MG TABLET    Take 180 mg by mouth daily.     FUROSEMIDE (LASIX) 40 MG TABLET    Take 40 mg by mouth daily.   GABAPENTIN (NEURONTIN) 800 MG TABLET    Take 1,600 mg by mouth 2 (two) times daily.    HYDROCHLOROTHIAZIDE (HYDRODIURIL) 12.5 MG TABLET    Take 12.5 mg by mouth at bedtime.   INSULIN ASPART (NOVOLOG) 100 UNIT/ML INJECTION    Inject 5 Units into the skin every evening.   INSULIN GLARGINE (LANTUS) 100 UNIT/ML INJECTION    Inject 20 Units into the skin at bedtime.   LIRAGLUTIDE (VICTOZA) 18 MG/3ML SOPN    Inject 1.8 mg into the skin daily.   METFORMIN (GLUCOPHAGE) 1000 MG TABLET    Take 1 tablet (1,000 mg total) by mouth 2 (two) times daily.   METOPROLOL (LOPRESSOR) 50 MG TABLET    Take 1 tablet (50 mg total) by mouth 2 (two) times daily.   MULTIPLE VITAMIN (MULTIVITAMIN WITH MINERALS) TABS TABLET    Take 1 tablet by mouth daily.   OMEGA-3 FATTY ACIDS (FISH OIL) 1200 MG CAPS  Take 1,200 mg by mouth 2 (two) times daily.   OXYCODONE (OXYCONTIN) 40 MG T12A 12 HR TABLET    Take 1 tablet (40 mg total) by mouth every 4 (four) hours as needed.   OXYCODONE HCL 10 MG TABS    Take 10 mg by mouth 3 (three) times daily as needed (for pain).   OXYCODONE HCL 20 MG TABS    Take 1 tablet (20 mg total) by mouth every 4 (four) hours as needed.   PRAVASTATIN (PRAVACHOL) 10 MG TABLET    Take 10 mg by mouth at bedtime.    RANITIDINE (ZANTAC) 300 MG TABLET    Take 300 mg by mouth daily.   SITAGLIPTAN (JANUVIA) 100 MG TABLET    Take 100 mg by mouth daily.     TESTOSTERONE CYPIONATE (DEPOTESTOSTERONE CYPIONATE) 200 MG/ML INJECTION    Inject 200 mg into the muscle every 14 (fourteen) days.    Review of Systems  HENT: Negative.   Respiratory: Negative.   Cardiovascular: Negative.   Musculoskeletal: Positive for gait problem.  Neurological: Positive for dizziness and weakness.  Psychiatric/Behavioral: Negative.     Social History  Substance Use Topics  . Smoking status: Former Smoker -- 1.00  packs/day for 25 years    Types: Cigarettes    Quit date: 12/26/1989  . Smokeless tobacco: Never Used  . Alcohol Use: No   Objective:   BP 138/58 mmHg  Pulse 80  Temp(Src) 98.4 F (36.9 C)  Resp 16  Ht 5\' 11"  (1.803 m)  Wt 221 lb (100.245 kg)  BMI 30.84 kg/m2  Physical Exam  Constitutional: He is oriented to person, place, and time. He appears well-developed and well-nourished.  HENT:  Head: Normocephalic.  Right Ear: External ear normal.  Left Ear: External ear normal.  Nose: Nose normal.  Eyes: Conjunctivae are normal.  Neck: Neck supple.  Cardiovascular: Normal rate, regular rhythm and normal heart sounds.   Pulmonary/Chest: Effort normal and breath sounds normal.  Neurological: He is alert and oriented to person, place, and time.  Skin: Skin is warm and dry.  Psychiatric: He has a normal mood and affect. His behavior is normal. Judgment and thought content normal.        Assessment & Plan:     TIA Clinically this is what fits although reaction to polypharmacy postoperative is also highly likely. Pt wishes to not take Plavix although I think it would be a good idea umtil he is evaluated by neurology.  Refer to Neurology. OA S/p left TKR. Chronic LS Radiculopathy HLD Increase Lipitor to 40mg        [name], MD  [facility]

## 2015-09-11 DIAGNOSIS — M170 Bilateral primary osteoarthritis of knee: Secondary | ICD-10-CM | POA: Diagnosis not present

## 2015-09-13 ENCOUNTER — Ambulatory Visit: Payer: Medicare Other | Attending: Otolaryngology

## 2015-09-13 DIAGNOSIS — R0683 Snoring: Secondary | ICD-10-CM | POA: Diagnosis not present

## 2015-09-13 DIAGNOSIS — G4733 Obstructive sleep apnea (adult) (pediatric): Secondary | ICD-10-CM | POA: Insufficient documentation

## 2015-09-16 ENCOUNTER — Telehealth: Payer: Self-pay | Admitting: Family Medicine

## 2015-09-16 DIAGNOSIS — H2513 Age-related nuclear cataract, bilateral: Secondary | ICD-10-CM | POA: Diagnosis not present

## 2015-09-16 NOTE — Telephone Encounter (Signed)
Jeri Cos called stating [facility] [place] and Sam need written ok for them to start PT on Thursday.  Please fax this to [phone] [facility]

## 2015-09-16 NOTE — Telephone Encounter (Signed)
Matt with [employer] about the form on pt for clearance for Physical Therapy. Matt stating that they need those forms so pt can start therapy.CC

## 2015-09-16 NOTE — Telephone Encounter (Signed)
Dr. Darnell Level, have you seen forms? Thanks!

## 2015-09-17 ENCOUNTER — Encounter: Payer: Self-pay | Admitting: Cardiovascular Disease

## 2015-09-17 ENCOUNTER — Ambulatory Visit (INDEPENDENT_AMBULATORY_CARE_PROVIDER_SITE_OTHER): Payer: Medicare Other | Admitting: Cardiovascular Disease

## 2015-09-17 VITALS — BP 112/62 | HR 79 | Ht 71.0 in | Wt 249.8 lb

## 2015-09-17 DIAGNOSIS — R600 Localized edema: Secondary | ICD-10-CM | POA: Diagnosis not present

## 2015-09-17 DIAGNOSIS — I639 Cerebral infarction, unspecified: Secondary | ICD-10-CM

## 2015-09-17 DIAGNOSIS — E118 Type 2 diabetes mellitus with unspecified complications: Secondary | ICD-10-CM | POA: Diagnosis not present

## 2015-09-17 DIAGNOSIS — I480 Paroxysmal atrial fibrillation: Secondary | ICD-10-CM

## 2015-09-17 DIAGNOSIS — E785 Hyperlipidemia, unspecified: Secondary | ICD-10-CM

## 2015-09-17 DIAGNOSIS — IMO0002 Reserved for concepts with insufficient information to code with codable children: Secondary | ICD-10-CM

## 2015-09-17 DIAGNOSIS — E1165 Type 2 diabetes mellitus with hyperglycemia: Secondary | ICD-10-CM

## 2015-09-17 DIAGNOSIS — I10 Essential (primary) hypertension: Secondary | ICD-10-CM

## 2015-09-17 NOTE — Patient Instructions (Signed)
You are doing well. No medication changes were made.  Please call us if you have new issues that need to be addressed before your next appt.  Your physician wants you to follow-up in: 6 months.  You will receive a reminder letter in the mail two months in advance. If you don't receive a letter, please call our office to schedule the follow-up appointment.   

## 2015-09-17 NOTE — Assessment & Plan Note (Signed)
Leg swelling secondary to mild chronic venous insufficiency, trace pitting Recommended he could wear compression hose if needed

## 2015-09-17 NOTE — Assessment & Plan Note (Signed)
We have encouraged continued exercise, careful diet management in an effort to lose weight. 

## 2015-09-17 NOTE — Assessment & Plan Note (Signed)
Blood pressure is well controlled on today's visit. No changes made to the medications. 

## 2015-09-17 NOTE — Telephone Encounter (Signed)
Done-aa 

## 2015-09-17 NOTE — Progress Notes (Signed)
Patient ID: AMMAR MOFFATT, male    DOB: [birth year], 64 y.o.   MRN: [id number]  HPI Comments: Mr. Bernards is a very pleasant 64 year old gentleman, patient of Dr. [name], with a history of multiple back surgeries, spinal stenosis , obesity, poorly controlled diabetes , DVT, PE in March 2011 previously on warfarin, 30 year smoking history,  hypertension, depression, and history of renal disease, s/p arthroscopic knee surgery,   presenting for routine followup of his hypertension and cardiac risk factors. . History of obstructive sleep apnea, does not tolerate CPAP, chronic sweating at nighttime which he attributes to the Vicodin. 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 He was kept overnight and most of his workup was relatively unrevealing including MRI/MRA, carotid ultrasound, echocardiogram. He was discharged home, seen by Dr. [name] the next day, No further episodes since that time Wife reports he was confused after the fall, but symptoms resolved by the next day Currently takes aspirin, does not want to take Plavix  Currently feels well with no complaints, ready to restart his PT for his left knee He is back on his Lipitor 40 mg daily  EKG on today's visit shows normal sinus rhythm with rate 79 bpm, no significant ST or T-wave changes  Other past medical history History of atrial flutter, status post cardioversion on 03/14/2013.   2 admissions to the hospital for gastroenteritis/ campylobacter infection. Required a long course of antibiotics. Possible  infection from tainted chicken.  Continues on amiodarone 200 mg daily, metoprolol tartrate 50 mg twice a day (no longer taking anticoagulation)   Previously on prednisone for his "legs". Sugars ran high. Previously developed leg swelling. Wife went away and  he sat in a recliner with his feet down for close to 2 weeks watching TV, drinking fluids.  Creatinine at the end of April was 1.5, BUN 27, possibly from overdiuresis  cardiac workup in the 1990s:  cardiac catheterization with "minimal blockage". Previous DVT , he did have shortness of breath.  echocardiogram March 2011 shows normal LV and RV systolic function, mild LVH, diastolic dysfunction, mildly dilated left atrium.  Allergies  Allergen Reactions  . Carisoprodol Other (See Comments)    Reaction:  Unknown     Outpatient Encounter Prescriptions as of 09/17/2015  Medication Sig  . amiodarone (PACERONE) 200 MG tablet Take 1 tablet (200 mg total) by mouth daily.  [name] amitriptyline (ELAVIL) 25 MG tablet Take 25 mg by mouth at bedtime.  [name] aspirin EC 81 MG tablet Take 81 mg by mouth at bedtime.  [name] atorvastatin (LIPITOR) 40 MG tablet Take 1 tablet (40 mg total) by mouth daily at 6 PM.  . budesonide (RHINOCORT AQUA) 32 MCG/ACT nasal spray Place 1 spray into the nose daily as needed for rhinitis.   . cyclobenzaprine (FLEXERIL) 10 MG tablet Take 10 mg by mouth 2 (two) times daily.   . diazepam (VALIUM) 2 MG tablet Take 2 mg by mouth 3 (three) times daily as needed for anxiety.  . dutasteride (AVODART) 0.5 MG capsule Take 0.5 mg by mouth daily.    [name] esomeprazole (NEXIUM) 40 MG capsule Take 40 mg by mouth 2 (two) times daily.   . fexofenadine (ALLEGRA) 180 MG tablet Take 180 mg by mouth daily.    . furosemide (LASIX) 40 MG tablet Take 40 mg by  mouth daily.  [name] gabapentin (NEURONTIN) 800 MG tablet Take 1,600 mg by mouth 4 (four) times daily.   . hydrochlorothiazide (HYDRODIURIL) 12.5 MG tablet Take 12.5 mg by mouth at bedtime.  . insulin aspart (NOVOLOG) 100 UNIT/ML injection Inject 5 Units into the skin every evening.  . insulin glargine (LANTUS) 100 UNIT/ML injection Inject 25 Units into the skin at bedtime.   . Liraglutide (VICTOZA) 18 MG/3ML SOPN Inject 1.8 mg into the skin daily.  . metFORMIN  (GLUCOPHAGE) 1000 MG tablet Take 1 tablet (1,000 mg total) by mouth 2 (two) times daily.  . metoprolol (LOPRESSOR) 50 MG tablet Take 1 tablet (50 mg total) by mouth 2 (two) times daily.  . Multiple Vitamin (MULTIVITAMIN WITH MINERALS) TABS tablet Take 1 tablet by mouth daily.  . Omega-3 Fatty Acids (FISH OIL) 1200 MG CAPS Take 1,200 mg by mouth 2 (two) times daily.  . OxyCODONE (OXYCONTIN) 40 mg T12A 12 hr tablet Take 1 tablet (40 mg total) by mouth every 4 (four) hours as needed. (Patient taking differently: Take 40 mg by mouth every 8 (eight) hours. )  . Oxycodone HCl 20 MG TABS Take 1 tablet (20 mg total) by mouth every 4 (four) hours as needed.  . pravastatin (PRAVACHOL) 20 MG tablet Take 20 mg by mouth daily.  . ranitidine (ZANTAC) 300 MG tablet Take 300 mg by mouth daily.  . sitaGLIPtan (JANUVIA) 100 MG tablet Take 100 mg by mouth daily.    [name] testosterone cypionate (DEPOTESTOSTERONE CYPIONATE) 200 MG/ML injection Inject 200 mg into the muscle every 14 (fourteen) days.  . [DISCONTINUED] clopidogrel (PLAVIX) 75 MG tablet Take 1 tablet (75 mg total) by mouth daily. (Patient not taking: Reported on 09/17/2015)  . [DISCONTINUED] fenofibrate (TRICOR) 145 MG tablet Take 145 mg by mouth at bedtime.  . [DISCONTINUED] Magnesium Oxide 400 MG CAPS Take 1 capsule (400 mg total) by mouth 2 (two) times daily. (Patient not taking: Reported on 09/17/2015)  . [DISCONTINUED] Oxycodone HCl 10 MG TABS Take 10 mg by mouth 3 (three) times daily as needed (for pain).  . [DISCONTINUED] pravastatin (PRAVACHOL) 10 MG tablet Take 10 mg by mouth at bedtime.    No facility-administered encounter medications on file as of 09/17/2015.    Past Medical History  Diagnosis Date  . Hypertension   . Arrhythmia     tachycardia  . DVT (deep venous thrombosis) 02/2010  . Diabetes mellitus   . Pulmonary embolus   . Kidney failure   . Sleep apnea   . Neuropathy   . GERD (gastroesophageal reflux disease)   . Depression   .  BPH (benign prostatic hyperplasia)   . 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     x2  . TIA (transient ischemic attack)     Past Surgical History  Procedure Laterality Date  . Gallbladder surgery  2002  . Rotator cuff repair  2001  . Back surgery      x 8; upper x 3 & lower x 5  . Cardioversion    . Knee arthroscopy      Social History  reports that he quit smoking about 25 years ago. His smoking use included Cigarettes. He has a 25 pack-year smoking history. He has never used smokeless tobacco. He reports that he does not drink alcohol or use illicit drugs.  Family History family history includes Heart attack in his brother.  Review of Systems  Constitutional: Negative.   Respiratory: Negative.   Cardiovascular: Negative.   Gastrointestinal:  Negative.   Musculoskeletal: Positive for back pain, arthralgias and gait problem.  Skin: Negative.   Neurological: Negative.   Hematological: Negative.   Psychiatric/Behavioral: Negative.   All other systems reviewed and are negative.  There were no vitals taken for this visit.  Physical Exam  Constitutional: He is oriented to person, place, and time. He appears well-developed and well-nourished.  Obese  HENT:  Head: Normocephalic.  Nose: Nose normal.  Mouth/Throat: Oropharynx is clear and moist.  Eyes: Conjunctivae are normal. Pupils are equal, round, and reactive to light.  Neck: Normal range of motion. Neck supple. No JVD present.  Cardiovascular: Normal rate, regular rhythm, S1 normal, S2 normal, normal heart sounds and intact distal pulses.  Exam reveals no gallop and no friction rub.   No murmur heard. Pulmonary/Chest: Effort normal and breath sounds normal. No respiratory distress. He has no wheezes. He has no rales. He exhibits no tenderness.  Abdominal: Soft. Bowel sounds are normal. He exhibits no distension. There is no tenderness.  Musculoskeletal: Normal range of motion. He exhibits no edema or tenderness.   Lymphadenopathy:    He has no cervical adenopathy.  Neurological: He is alert and oriented to person, place, and time. Coordination normal.  Skin: Skin is warm and dry. No rash noted. No erythema.  Psychiatric: He has a normal mood and affect. His behavior is normal. Judgment and thought content normal.      Assessment and Plan   Nursing note and vitals reviewed.

## 2015-09-17 NOTE — Assessment & Plan Note (Signed)
Encouraged him to stay on his Lipitor 40 mg daily. Previously was on pravastatin

## 2015-09-17 NOTE — Assessment & Plan Note (Signed)
Recent hospital admission details reviewed with him Unable to exclude leg weakness leading to fall, concussion, confusion He was diagnosed with TIA though unclear at this time Imaging was essentially normal He is currently on aspirin. Does not want Plavix We discussed if he has recurrent episodes, would need to revisit his antiplatelets/anticoagulation regimen

## 2015-09-18 ENCOUNTER — Ambulatory Visit (INDEPENDENT_AMBULATORY_CARE_PROVIDER_SITE_OTHER): Payer: Medicare Other | Admitting: Family Medicine

## 2015-09-18 ENCOUNTER — Encounter: Payer: Self-pay | Admitting: Family Medicine

## 2015-09-18 VITALS — BP 118/64 | HR 72 | Temp 98.4°F | Resp 16 | Ht 71.0 in | Wt 252.0 lb

## 2015-09-18 DIAGNOSIS — I639 Cerebral infarction, unspecified: Secondary | ICD-10-CM

## 2015-09-18 DIAGNOSIS — Z23 Encounter for immunization: Secondary | ICD-10-CM

## 2015-09-18 DIAGNOSIS — M170 Bilateral primary osteoarthritis of knee: Secondary | ICD-10-CM | POA: Diagnosis not present

## 2015-09-18 NOTE — Progress Notes (Signed)
Patient ID: Jerry Parsons, male   DOB: August 21, 1951, 64 y.o.   MRN: [id number]       [name]    DOB: Jan 22, 1951   64 y.o.   MRN: [id number] Visit Date: 09/18/2015  Today's Provider: [name], MD   Chief Complaint  Patient presents with  . Hyperlipidemia    F/U. Increased Lipitor to 40mg  daily. Patient is tolerating well.   . Transient Ischemic Attack    Patient reports that he has had no episodes since LOV.    Subjective:    Hyperlipidemia This is a chronic problem. The problem is controlled. Pertinent negatives include no chest pain, focal sensory loss, focal weakness, leg pain, myalgias or shortness of breath. There are no compliance problems.   TIA Patient is also here following up on his ER visit from 09/09/2015. Patient reports that he is walking much better, and he has been compliant with physical therapy visits.      Allergies  Allergen Reactions  . Carisoprodol Other (See Comments)    Reaction:  Unknown    Previous Medications   AMIODARONE (PACERONE) 200 MG TABLET    Take 1 tablet (200 mg total) by mouth daily.   AMITRIPTYLINE (ELAVIL) 25 MG TABLET    Take 25 mg by mouth at bedtime.   ASPIRIN EC 81 MG TABLET    Take 81 mg by mouth at bedtime.   ATORVASTATIN (LIPITOR) 40 MG TABLET    Take 1 tablet (40 mg total) by mouth daily at 6 PM.   BUDESONIDE (RHINOCORT AQUA) 32 MCG/ACT NASAL SPRAY    Place 1 spray into the nose daily as needed for rhinitis.    CYCLOBENZAPRINE (FLEXERIL) 10 MG TABLET    Take 10 mg by mouth 2 (two) times daily.    DIAZEPAM (VALIUM) 2 MG TABLET    Take 2 mg by mouth 3 (three) times daily as needed for anxiety.   DUTASTERIDE (AVODART) 0.5 MG CAPSULE    Take 0.5 mg by mouth daily.     ESOMEPRAZOLE (NEXIUM) 40 MG CAPSULE    Take 40 mg by mouth 2 (two) times daily.    FEXOFENADINE (ALLEGRA) 180 MG TABLET    Take 180 mg by mouth daily.     FUROSEMIDE (LASIX) 40 MG TABLET    Take 40 mg by mouth daily.   GABAPENTIN (NEURONTIN)  800 MG TABLET    Take 1,600 mg by mouth 4 (four) times daily.    HYDROCHLOROTHIAZIDE (HYDRODIURIL) 12.5 MG TABLET    Take 12.5 mg by mouth at bedtime.   INSULIN ASPART (NOVOLOG) 100 UNIT/ML INJECTION    Inject 5 Units into the skin every evening.   INSULIN GLARGINE (LANTUS) 100 UNIT/ML INJECTION    Inject 25 Units into the skin at bedtime.    LIRAGLUTIDE (VICTOZA) 18 MG/3ML SOPN    Inject 1.8 mg into the skin daily.   METFORMIN (GLUCOPHAGE) 1000 MG TABLET    Take 1 tablet (1,000 mg total) by mouth 2 (two) times daily.   METOPROLOL (LOPRESSOR) 50 MG TABLET    Take 1 tablet (50 mg total) by mouth 2 (two) times daily.   MULTIPLE VITAMIN (MULTIVITAMIN WITH MINERALS) TABS TABLET    Take 1 tablet by mouth daily.   OMEGA-3 FATTY ACIDS (FISH OIL) 1200 MG CAPS    Take 1,200 mg by mouth 2 (two) times daily.   OXYCODONE (OXYCONTIN) 40 MG T12A 12 HR TABLET    Take 1 tablet (40  mg total) by mouth every 4 (four) hours as needed.   OXYCODONE HCL 20 MG TABS    Take 1 tablet (20 mg total) by mouth every 4 (four) hours as needed.   RANITIDINE (ZANTAC) 300 MG TABLET    Take 300 mg by mouth daily.   SITAGLIPTAN (JANUVIA) 100 MG TABLET    Take 100 mg by mouth daily.     TESTOSTERONE CYPIONATE (DEPOTESTOSTERONE CYPIONATE) 200 MG/ML INJECTION    Inject 200 mg into the muscle every 14 (fourteen) days.    Review of Systems  Constitutional: Negative.   Respiratory: Negative for shortness of breath.   Cardiovascular: Negative.  Negative for chest pain.  Musculoskeletal: Positive for gait problem. Negative for myalgias.       Patient walks with a cane to help with balance.   Neurological: Negative.  Negative for focal weakness.  Psychiatric/Behavioral: Negative.     Social History  Substance Use Topics  . Smoking status: Former Smoker -- 1.00 packs/day for 25 years    Types: Cigarettes    Quit date: 12/26/1989  . Smokeless tobacco: Never Used  . Alcohol Use: No   Objective:   BP 118/64 mmHg  Pulse 72   Temp(Src) 98.4 F (36.9 C)  Resp 16  Ht 5\' 11"  (1.803 m)  Wt 252 lb (114.306 kg)  BMI 35.16 kg/m2  Physical Exam  Constitutional: He is oriented to person, place, and time. He appears well-developed and well-nourished.  HENT:  Head: Normocephalic and atraumatic.  Right Ear: External ear normal.  Left Ear: External ear normal.  Nose: Nose normal.  Eyes: Conjunctivae are normal.  Neck: Neck supple.  Cardiovascular: Normal rate, regular rhythm and normal heart sounds.   Pulmonary/Chest: Effort normal and breath sounds normal.  Abdominal: Soft.  Neurological: He is alert and oriented to person, place, and time.  Grossly nonfocal except for chronic ptosis of right eyelid  Skin: Skin is warm and dry.  Psychiatric: He has a normal mood and affect. His behavior is normal. Judgment and thought content normal.        Assessment & Plan:     1. Flu vaccine need  - Flu vaccine HIGH DOSE PF (Fluzone High dose) 2Severe OA/DDD 3TIIDM 4cataracts For surgery by dr Murvin Natal November 2016. 5TIA All symptoms resolved. I have done the exam and reviewed the above chart and it is accurate to the best of my knowledge.       Kabrina Christiano Cranford Mon, MD  [facility]

## 2015-09-19 ENCOUNTER — Encounter: Payer: Self-pay | Admitting: Family Medicine

## 2015-09-23 DIAGNOSIS — M170 Bilateral primary osteoarthritis of knee: Secondary | ICD-10-CM | POA: Diagnosis not present

## 2015-09-24 DIAGNOSIS — Z125 Encounter for screening for malignant neoplasm of prostate: Secondary | ICD-10-CM | POA: Diagnosis not present

## 2015-09-24 DIAGNOSIS — R3914 Feeling of incomplete bladder emptying: Secondary | ICD-10-CM | POA: Diagnosis not present

## 2015-09-24 DIAGNOSIS — N401 Enlarged prostate with lower urinary tract symptoms: Secondary | ICD-10-CM | POA: Diagnosis not present

## 2015-09-24 DIAGNOSIS — E291 Testicular hypofunction: Secondary | ICD-10-CM | POA: Diagnosis not present

## 2015-09-24 DIAGNOSIS — Z79899 Other long term (current) drug therapy: Secondary | ICD-10-CM | POA: Diagnosis not present

## 2015-09-24 DIAGNOSIS — R351 Nocturia: Secondary | ICD-10-CM | POA: Diagnosis not present

## 2015-09-25 DIAGNOSIS — M1711 Unilateral primary osteoarthritis, right knee: Secondary | ICD-10-CM | POA: Diagnosis not present

## 2015-09-26 DIAGNOSIS — M170 Bilateral primary osteoarthritis of knee: Secondary | ICD-10-CM | POA: Diagnosis not present

## 2015-09-29 DIAGNOSIS — N471 Phimosis: Secondary | ICD-10-CM | POA: Diagnosis not present

## 2015-09-30 ENCOUNTER — Ambulatory Visit: Payer: Medicare Other | Admitting: Cardiovascular Disease

## 2015-09-30 DIAGNOSIS — M170 Bilateral primary osteoarthritis of knee: Secondary | ICD-10-CM | POA: Diagnosis not present

## 2015-10-02 DIAGNOSIS — M170 Bilateral primary osteoarthritis of knee: Secondary | ICD-10-CM | POA: Diagnosis not present

## 2015-10-06 ENCOUNTER — Other Ambulatory Visit: Payer: Self-pay | Admitting: Family Medicine

## 2015-10-06 DIAGNOSIS — Z4789 Encounter for other orthopedic aftercare: Secondary | ICD-10-CM | POA: Diagnosis not present

## 2015-10-06 DIAGNOSIS — S83282D Other tear of lateral meniscus, current injury, left knee, subsequent encounter: Secondary | ICD-10-CM | POA: Diagnosis not present

## 2015-10-06 DIAGNOSIS — S83242D Other tear of medial meniscus, current injury, left knee, subsequent encounter: Secondary | ICD-10-CM | POA: Diagnosis not present

## 2015-10-06 NOTE — Telephone Encounter (Signed)
Pt's wife Rise Paganini is requesting a 90 day supply sent to [facility] order. Thanks TNP

## 2015-10-06 NOTE — Telephone Encounter (Signed)
Please print that 180 tablets. We can only give it the patient. We cannot send in a scheduled II drug.

## 2015-10-07 MED ORDER — OXYCODONE HCL ER 40 MG PO T12A
40.0000 mg | EXTENDED_RELEASE_TABLET | ORAL | Status: DC | PRN
Start: 2015-10-07 — End: 2015-12-10

## 2015-10-07 NOTE — Telephone Encounter (Signed)
reasy for pick up. LM for pt or wife.

## 2015-10-13 ENCOUNTER — Other Ambulatory Visit: Payer: Self-pay | Admitting: Family Medicine

## 2015-10-14 ENCOUNTER — Other Ambulatory Visit: Payer: Self-pay | Admitting: Family Medicine

## 2015-10-17 ENCOUNTER — Other Ambulatory Visit: Payer: Self-pay | Admitting: Family Medicine

## 2015-10-20 ENCOUNTER — Other Ambulatory Visit: Payer: Self-pay | Admitting: Family Medicine

## 2015-10-20 NOTE — Telephone Encounter (Signed)
Pt contacted office for refill request on the following medications:  diazepam (VALIUM) 2 MG tablet.  diazepam (VALIUM) 2 MG tablet.  [facility].  TO#[phone]/IP  This a Dr [name] pt/MW

## 2015-10-20 NOTE — Telephone Encounter (Signed)
Please review-aa 

## 2015-10-21 ENCOUNTER — Other Ambulatory Visit: Payer: Self-pay | Admitting: Family Medicine

## 2015-10-21 DIAGNOSIS — H2513 Age-related nuclear cataract, bilateral: Secondary | ICD-10-CM | POA: Diagnosis not present

## 2015-10-21 MED ORDER — DIAZEPAM 2 MG PO TABS: 2.0000 mg | ORAL_TABLET | Freq: Three times a day (TID) | ORAL | Status: DC | PRN

## 2015-10-21 NOTE — Telephone Encounter (Signed)
Rx called in to pharmacy. 

## 2015-10-21 NOTE — Telephone Encounter (Signed)
Please call in diazepam.  

## 2015-10-23 ENCOUNTER — Encounter: Payer: Self-pay | Admitting: *Deleted

## 2015-10-28 NOTE — Discharge Instructions (Signed)

## 2015-10-29 ENCOUNTER — Ambulatory Visit: Payer: Medicare Other | Admitting: Anesthesiology

## 2015-10-29 ENCOUNTER — Ambulatory Visit
Admission: RE | Admit: 2015-10-29 | Discharge: 2015-10-29 | Disposition: A | Discharge status: Home / Self Care | Payer: Medicare Other | Source: Ambulatory Visit | Attending: Ophthalmology | Admitting: Ophthalmology

## 2015-10-29 ENCOUNTER — Encounter
Admission: RE | Disposition: A | Discharge status: Home / Self Care | Payer: Self-pay | Source: Ambulatory Visit | Attending: Ophthalmology

## 2015-10-29 DIAGNOSIS — Z9049 Acquired absence of other specified parts of digestive tract: Secondary | ICD-10-CM | POA: Insufficient documentation

## 2015-10-29 DIAGNOSIS — H2513 Age-related nuclear cataract, bilateral: Secondary | ICD-10-CM | POA: Diagnosis not present

## 2015-10-29 DIAGNOSIS — Z8673 Personal history of transient ischemic attack (TIA), and cerebral infarction without residual deficits: Secondary | ICD-10-CM | POA: Insufficient documentation

## 2015-10-29 DIAGNOSIS — Z87891 Personal history of nicotine dependence: Secondary | ICD-10-CM | POA: Insufficient documentation

## 2015-10-29 DIAGNOSIS — I4891 Unspecified atrial fibrillation: Secondary | ICD-10-CM | POA: Insufficient documentation

## 2015-10-29 DIAGNOSIS — K219 Gastro-esophageal reflux disease without esophagitis: Secondary | ICD-10-CM | POA: Diagnosis not present

## 2015-10-29 DIAGNOSIS — Z86718 Personal history of other venous thrombosis and embolism: Secondary | ICD-10-CM | POA: Diagnosis not present

## 2015-10-29 DIAGNOSIS — H2512 Age-related nuclear cataract, left eye: Secondary | ICD-10-CM | POA: Diagnosis not present

## 2015-10-29 DIAGNOSIS — M810 Age-related osteoporosis without current pathological fracture: Secondary | ICD-10-CM | POA: Diagnosis not present

## 2015-10-29 DIAGNOSIS — E7800 Pure hypercholesterolemia, unspecified: Secondary | ICD-10-CM | POA: Diagnosis not present

## 2015-10-29 DIAGNOSIS — E1140 Type 2 diabetes mellitus with diabetic neuropathy, unspecified: Secondary | ICD-10-CM | POA: Insufficient documentation

## 2015-10-29 DIAGNOSIS — H269 Unspecified cataract: Secondary | ICD-10-CM | POA: Diagnosis present

## 2015-10-29 DIAGNOSIS — Z9889 Other specified postprocedural states: Secondary | ICD-10-CM | POA: Diagnosis not present

## 2015-10-29 DIAGNOSIS — G4730 Sleep apnea, unspecified: Secondary | ICD-10-CM | POA: Insufficient documentation

## 2015-10-29 DIAGNOSIS — Z888 Allergy status to other drugs, medicaments and biological substances status: Secondary | ICD-10-CM | POA: Insufficient documentation

## 2015-10-29 DIAGNOSIS — Z79899 Other long term (current) drug therapy: Secondary | ICD-10-CM | POA: Diagnosis not present

## 2015-10-29 DIAGNOSIS — M1990 Unspecified osteoarthritis, unspecified site: Secondary | ICD-10-CM | POA: Insufficient documentation

## 2015-10-29 HISTORY — DX: Torticollis: M43.6

## 2015-10-29 HISTORY — DX: Blindness, one eye, unspecified eye: H54.40

## 2015-10-29 HISTORY — PX: CATARACT EXTRACTION W/PHACO: SHX586

## 2015-10-29 HISTORY — DX: Other seasonal allergic rhinitis: J30.2

## 2015-10-29 HISTORY — DX: Presence of dental prosthetic device (complete) (partial): Z97.2

## 2015-10-29 HISTORY — DX: Unspecified convulsions: R56.9

## 2015-10-29 HISTORY — DX: Pure hypercholesterolemia, unspecified: E78.00

## 2015-10-29 LAB — GLUCOSE, CAPILLARY
Glucose-Capillary: 224 mg/dL — ABNORMAL HIGH (ref 65–99)
Glucose-Capillary: 193 mg/dL — ABNORMAL HIGH (ref 65–99)

## 2015-10-29 SURGERY — PHACOEMULSIFICATION, CATARACT, WITH IOL INSERTION
Anesthesia: Monitor Anesthesia Care | Laterality: Left | Wound class: Clean

## 2015-10-29 MED ORDER — LIDOCAINE HCL (PF) 4 % IJ SOLN
INTRAOCULAR | Status: DC | PRN
  Administered 2015-10-29: 1 mL via OPHTHALMIC

## 2015-10-29 MED ORDER — TIMOLOL MALEATE 0.5 % OP SOLN
OPHTHALMIC | Status: DC | PRN
  Administered 2015-10-29: 1 [drp] via OPHTHALMIC

## 2015-10-29 MED ORDER — ARMC OPHTHALMIC DILATING GEL
1.0000 "application " | OPHTHALMIC | Status: DC | PRN
  Administered 2015-10-29 (×2): 1 via OPHTHALMIC

## 2015-10-29 MED ORDER — EPINEPHRINE HCL 1 MG/ML IJ SOLN
INTRAOCULAR | Status: DC | PRN
  Administered 2015-10-29: 92 mL via OPHTHALMIC
  Administered 2015-10-29: 08:00:00 via OPHTHALMIC

## 2015-10-29 MED ORDER — POVIDONE-IODINE 5 % OP SOLN
1.0000 "application " | OPHTHALMIC | Status: DC | PRN
  Administered 2015-10-29: 1 via OPHTHALMIC

## 2015-10-29 MED ORDER — BRIMONIDINE TARTRATE 0.2 % OP SOLN
OPHTHALMIC | Status: DC | PRN
  Administered 2015-10-29: 1 [drp] via OPHTHALMIC

## 2015-10-29 MED ORDER — NA HYALUR & NA CHOND-NA HYALUR 0.4-0.35 ML IO KIT
PACK | INTRAOCULAR | Status: DC | PRN
  Administered 2015-10-29: 1 mL via INTRAOCULAR

## 2015-10-29 MED ORDER — MIDAZOLAM HCL 2 MG/2ML IJ SOLN
INTRAMUSCULAR | Status: DC | PRN
  Administered 2015-10-29: 2 mg via INTRAVENOUS

## 2015-10-29 MED ORDER — MOXIFLOXACIN HCL 0.5 % OP SOLN
OPHTHALMIC | Status: DC | PRN
  Administered 2015-10-29: 1 [drp]

## 2015-10-29 MED ORDER — TETRACAINE HCL 0.5 % OP SOLN
1.0000 [drp] | OPHTHALMIC | Status: DC | PRN
  Administered 2015-10-29: 1 [drp] via OPHTHALMIC

## 2015-10-29 MED ORDER — FENTANYL CITRATE (PF) 100 MCG/2ML IJ SOLN
INTRAMUSCULAR | Status: DC | PRN
  Administered 2015-10-29: 50 ug via INTRAVENOUS

## 2015-10-29 SURGICAL SUPPLY — 28 items
CANNULA ANT/CHMB 27G (MISCELLANEOUS) ×1 IMPLANT
CANNULA ANT/CHMB 27GA (MISCELLANEOUS) ×2 IMPLANT
GLOVE SURG LX 7.5 STRW (GLOVE) ×1
GLOVE SURG LX STRL 7.5 STRW (GLOVE) ×1 IMPLANT
GLOVE SURG TRIUMPH 8.0 PF LTX (GLOVE) ×2 IMPLANT
GOWN STRL REUS W/ TWL LRG LVL3 (GOWN DISPOSABLE) ×2 IMPLANT
GOWN STRL REUS W/TWL LRG LVL3 (GOWN DISPOSABLE) ×4
LENS IOL TECNIS 15.0 (Intraocular Lens) ×2 IMPLANT
LENS IOL TECNIS MONO 1P 15.0 (Intraocular Lens) IMPLANT
MARKER SKIN SURG W/RULER VIO (MISCELLANEOUS) ×2 IMPLANT
NDL FILTER BLUNT 18X1 1/2 (NEEDLE) ×1 IMPLANT
NDL RETROBULBAR .5 NSTRL (NEEDLE) IMPLANT
NEEDLE FILTER BLUNT 18X 1/2SAF (NEEDLE) ×1
NEEDLE FILTER BLUNT 18X1 1/2 (NEEDLE) ×1 IMPLANT
PACK CATARACT BRASINGTON (MISCELLANEOUS) ×2 IMPLANT
PACK EYE AFTER SURG (MISCELLANEOUS) ×2 IMPLANT
PACK OPTHALMIC (MISCELLANEOUS) ×2 IMPLANT
RING MALYGIN 7.0 (MISCELLANEOUS) IMPLANT
SUT ETHILON 10-0 CS-B-6CS-B-6 (SUTURE)
SUT VICRYL  9 0 (SUTURE)
SUT VICRYL 9 0 (SUTURE) IMPLANT
SUTURE EHLN 10-0 CS-B-6CS-B-6 (SUTURE) IMPLANT
SYR 3ML LL SCALE MARK (SYRINGE) ×2 IMPLANT
SYR 5ML LL (SYRINGE) IMPLANT
SYR TB 1ML LUER SLIP (SYRINGE) ×2 IMPLANT
WATER STERILE IRR 250ML POUR (IV SOLUTION) ×2 IMPLANT
WATER STERILE IRR 500ML POUR (IV SOLUTION) IMPLANT
WIPE NON LINTING 3.25X3.25 (MISCELLANEOUS) ×2 IMPLANT

## 2015-10-29 NOTE — Anesthesia Preprocedure Evaluation (Signed)
Anesthesia Evaluation  Patient identified by MRN, date of birth, ID band Patient awake    Reviewed: Allergy & Precautions, NPO status , Patient's Chart, lab work & pertinent test results  Airway Mallampati: II  TM Distance: >3 FB Neck ROM: Full    Dental  (+) Upper Dentures, Lower Dentures   Pulmonary sleep apnea , former smoker,    Pulmonary exam normal        Cardiovascular hypertension, + Peripheral Vascular Disease and +CHF  Normal cardiovascular exam+ dysrhythmias      Neuro/Psych Seizures -,  PSYCHIATRIC DISORDERS Depression TIA   GI/Hepatic Neg liver ROS, GERD  ,  Endo/Other  diabetes, Type 2  Renal/GU Renal disease  negative genitourinary   Musculoskeletal  (+) Arthritis , Osteoarthritis,    Abdominal   Peds negative pediatric ROS (+)  Hematology negative hematology ROS (+)   Anesthesia Other Findings   Reproductive/Obstetrics negative OB ROS                             Anesthesia Physical Anesthesia Plan  ASA: III  Anesthesia Plan: MAC   Post-op Pain Management:    Induction: Intravenous  Airway Management Planned:   Additional Equipment:   Intra-op Plan:   Post-operative Plan:   Informed Consent: I have reviewed the patients History and Physical, chart, labs and discussed the procedure including the risks, benefits and alternatives for the proposed anesthesia with the patient or authorized representative who has indicated his/her understanding and acceptance.     Plan Discussed with: CRNA  Anesthesia Plan Comments:         Anesthesia Quick Evaluation

## 2015-10-29 NOTE — Anesthesia Postprocedure Evaluation (Signed)
  Anesthesia Post-op Note  Patient: Jerry Parsons  Procedure(s) Performed: Procedure(s) with comments: CATARACT EXTRACTION PHACO AND INTRAOCULAR LENS PLACEMENT (IOC) (Left) - DIABETIC - insulin and oral meds Sleep apnea - no machine  Anesthesia type:MAC  Patient location: PACU  Post pain: Pain level controlled  Post assessment: Post-op Vital signs reviewed, Patient's Cardiovascular Status Stable, Respiratory Function Stable, Patent Airway and No signs of Nausea or vomiting  Post vital signs: Reviewed and stable  Last Vitals:  Filed Vitals:   10/29/15 0800  BP: 120/70  Pulse: 76  Temp: 36.4 C  Resp: 8    Level of consciousness: awake, alert  and patient cooperative  Complications: No apparent anesthesia complications

## 2015-10-29 NOTE — Op Note (Signed)
OPERATIVE NOTE  DJON TITH [id number] 10/29/2015   PREOPERATIVE DIAGNOSIS:  Nuclear sclerotic cataract left eye. H25.12   POSTOPERATIVE DIAGNOSIS:    Nuclear sclerotic cataract left eye.     PROCEDURE:  Phacoemusification with posterior chamber intraocular lens placement of the left eye   LENS:   Implant Name Type Inv. Item Serial No. Manufacturer Lot No. LRB No. Used  LENS IMPL INTRAOC ZCB00 15.0 - [id number] Intraocular Lens LENS IMPL INTRAOC ZCB00 15.0 [id number]   Left 1        ULTRASOUND TIME: 12  % of 1 minutes 49 seconds, CDE 12.9  SURGEON:  Wyonia Hough, MD   ANESTHESIA:  Topical with tetracaine drops and 2% Xylocaine jelly, augmented with 1% preservative-free intracameral lidocaine.    COMPLICATIONS:  None.   DESCRIPTION OF PROCEDURE:  The patient was identified in the holding room and transported to the operating room and placed in the supine position under the operating microscope.  The left eye was identified as the operative eye and it was prepped and draped in the usual sterile ophthalmic fashion.   A 1 millimeter clear-corneal paracentesis was made at the 1:30 position.  0.5 ml of preservative-free 1% lidocaine was injected into the anterior chamber. The anterior chamber was filled with Viscoat viscoelastic.  A 2.4 millimeter keratome was used to make a near-clear corneal incision at the 10:30 position.  .  A curvilinear capsulorrhexis was made with a cystotome and capsulorrhexis forceps.  Balanced salt solution was used to hydrodissect and hydrodelineate the nucleus.   Phacoemulsification was then used in stop and chop fashion to remove the lens nucleus and epinucleus.  The remaining cortex was then removed using the irrigation and aspiration handpiece. Provisc was then placed into the capsular bag to distend it for lens placement.  A lens was then injected into the capsular bag.  The remaining viscoelastic was aspirated.   Wounds were hydrated with  balanced salt solution.  The anterior chamber was inflated to a physiologic pressure with balanced salt solution.  No wound leaks were noted. Vigamox 0.2 ml of a 1mg  per ml solution was injected into the anterior chamber for a dose of 0.2 mg of intracameral antibiotic at the completion of the case.   Timolol and Brimonidine drops were applied to the eye.  The patient was taken to the recovery room in stable condition without complications of anesthesia or surgery.  Fae Blossom 10/29/2015, 8:03 AM

## 2015-10-29 NOTE — H&P (Signed)
  The History and Physical notes are on paper, have been signed, and are to be scanned. The patient remains stable and unchanged from the H&P.   Previous H&P reviewed, patient examined, and there are no changes.  Jerry Parsons 10/29/2015 7:36 AM

## 2015-10-29 NOTE — Transfer of Care (Signed)
Immediate Anesthesia Transfer of Care Note  Patient: Jerry Parsons  Procedure(s) Performed: Procedure(s) with comments: CATARACT EXTRACTION PHACO AND INTRAOCULAR LENS PLACEMENT (IOC) (Left) - DIABETIC - insulin and oral meds Sleep apnea - no machine  Patient Location: PACU  Anesthesia Type: MAC  Level of Consciousness: awake, alert  and patient cooperative  Airway and Oxygen Therapy: Patient Spontanous Breathing and Patient connected to supplemental oxygen  Post-op Assessment: Post-op Vital signs reviewed, Patient's Cardiovascular Status Stable, Respiratory Function Stable, Patent Airway and No signs of Nausea or vomiting  Post-op Vital Signs: Reviewed and stable  Complications: No apparent anesthesia complications

## 2015-10-29 NOTE — Anesthesia Procedure Notes (Signed)
Procedure Name: MAC Performed by: Rion Catala Pre-anesthesia Checklist: Patient identified, Emergency Drugs available, Suction available, Timeout performed and Patient being monitored Patient Re-evaluated:Patient Re-evaluated prior to inductionOxygen Delivery Method: Nasal cannula Placement Confirmation: positive ETCO2       

## 2015-10-30 ENCOUNTER — Encounter: Payer: Self-pay | Admitting: Ophthalmology

## 2015-11-05 DIAGNOSIS — E291 Testicular hypofunction: Secondary | ICD-10-CM | POA: Diagnosis not present

## 2015-11-10 ENCOUNTER — Other Ambulatory Visit: Payer: Self-pay | Admitting: Family Medicine

## 2015-11-12 ENCOUNTER — Other Ambulatory Visit: Payer: Self-pay

## 2015-11-12 MED ORDER — MAGNESIUM OXIDE 400 MG PO TABS: 400.0000 mg | ORAL_TABLET | Freq: Every day | ORAL | Status: DC

## 2015-11-13 ENCOUNTER — Telehealth: Payer: Self-pay | Admitting: Family Medicine

## 2015-11-13 ENCOUNTER — Other Ambulatory Visit: Payer: Self-pay

## 2015-11-13 MED ORDER — GLUCOSE BLOOD VI STRP: 1.0000 | ORAL_STRIP | Freq: Two times a day (BID) | Status: DC

## 2015-11-13 NOTE — Telephone Encounter (Signed)
Refill ONE TOUCH ULTRA TEST test strip 10/14/15 -- Richard Maceo Pro., MD [place]  Call back [phone]  Thanks Con Memos

## 2015-11-13 NOTE — Telephone Encounter (Signed)
Okay to refill for one year. Thank you

## 2015-11-15 ENCOUNTER — Other Ambulatory Visit: Payer: Self-pay

## 2015-11-15 MED ORDER — MAGNESIUM OXIDE 400 (241.3 MG) MG PO TABS: 400.0000 mg | ORAL_TABLET | Freq: Two times a day (BID) | ORAL | Status: DC

## 2015-12-02 DIAGNOSIS — M4806 Spinal stenosis, lumbar region: Secondary | ICD-10-CM | POA: Diagnosis not present

## 2015-12-02 DIAGNOSIS — M47816 Spondylosis without myelopathy or radiculopathy, lumbar region: Secondary | ICD-10-CM | POA: Diagnosis not present

## 2015-12-10 ENCOUNTER — Ambulatory Visit (INDEPENDENT_AMBULATORY_CARE_PROVIDER_SITE_OTHER): Payer: Medicare Other | Admitting: Family Medicine

## 2015-12-10 VITALS — BP 128/64 | HR 100 | Temp 98.2°F | Resp 20 | Wt 248.0 lb

## 2015-12-10 DIAGNOSIS — Z794 Long term (current) use of insulin: Secondary | ICD-10-CM

## 2015-12-10 DIAGNOSIS — E669 Obesity, unspecified: Secondary | ICD-10-CM | POA: Diagnosis not present

## 2015-12-10 DIAGNOSIS — M549 Dorsalgia, unspecified: Secondary | ICD-10-CM | POA: Diagnosis not present

## 2015-12-10 DIAGNOSIS — G629 Polyneuropathy, unspecified: Secondary | ICD-10-CM

## 2015-12-10 DIAGNOSIS — E118 Type 2 diabetes mellitus with unspecified complications: Secondary | ICD-10-CM | POA: Diagnosis not present

## 2015-12-10 DIAGNOSIS — E785 Hyperlipidemia, unspecified: Secondary | ICD-10-CM

## 2015-12-10 DIAGNOSIS — G8929 Other chronic pain: Secondary | ICD-10-CM | POA: Diagnosis not present

## 2015-12-10 DIAGNOSIS — M791 Myalgia, unspecified site: Secondary | ICD-10-CM

## 2015-12-10 DIAGNOSIS — IMO0002 Reserved for concepts with insufficient information to code with codable children: Secondary | ICD-10-CM

## 2015-12-10 DIAGNOSIS — I5032 Chronic diastolic (congestive) heart failure: Secondary | ICD-10-CM

## 2015-12-10 DIAGNOSIS — I639 Cerebral infarction, unspecified: Secondary | ICD-10-CM | POA: Diagnosis not present

## 2015-12-10 DIAGNOSIS — E1165 Type 2 diabetes mellitus with hyperglycemia: Secondary | ICD-10-CM

## 2015-12-10 LAB — POCT URINALYSIS DIPSTICK
Bilirubin, UA: NEGATIVE
Blood, UA: NEGATIVE
Glucose, UA: 2000
Ketones, UA: NEGATIVE
Leukocytes, UA: NEGATIVE
Nitrite, UA: NEGATIVE
Protein, UA: NEGATIVE
Spec Grav, UA: 1.02
Urobilinogen, UA: NEGATIVE
pH, UA: 6

## 2015-12-10 LAB — POCT GLYCOSYLATED HEMOGLOBIN (HGB A1C): Hemoglobin A1C: 9.1

## 2015-12-10 MED ORDER — OXYCODONE HCL ER 40 MG PO T12A: 40.0000 mg | EXTENDED_RELEASE_TABLET | Freq: Three times a day (TID) | ORAL | Status: DC | PRN

## 2015-12-10 MED ORDER — OXYCODONE HCL 20 MG PO TABS: 1.0000 | ORAL_TABLET | ORAL | Status: DC | PRN

## 2015-12-10 NOTE — Progress Notes (Signed)
Patient ID: Jerry Parsons, male   DOB: 07-15-1951, 64 y.o.   MRN: [id number]    Subjective:  HPI  Diabetes follow up  Lab Results  Component Value Date   HGBA1C 7.9* 09/09/2015   Patient is checking his sugar and can not get them below 200-usually around 250 and one time was 307. He has also noticed that his urine is very yellow and thick, but no discomfort with urination or pain. His pain management doctor thought it could be coming from uncontrolled sugar. Patient also had numbness sensation in his leg which he states he mentioned before.  Hyperlipidemia follow up: Patient had levels checked last time on 09/07/15 at [facility] and then on 09/09/15 we increased Lipitor to 40 mg but no levels have been re checked. Lab Results  Component Value Date   CHOL 192 09/09/2015   HDL 29* 09/09/2015   LDLCALC UNABLE TO CALCULATE IF TRIGLYCERIDE OVER 400 mg/dL 09/09/2015   TRIG 519* 09/09/2015   CHOLHDL 6.6 09/09/2015    He had another fall 2 weeks ago he turned around too fast and got off balance and fell and hit his head. He felt like he hit the ground pretty hard because he "felt out of it for a while after". He was not able to get up without his wife assistance.   Prior to Admission medications   Medication Sig Start Date End Date Taking? Authorizing Provider  amiodarone (PACERONE) 200 MG tablet Take 1 tablet (200 mg total) by mouth daily. 02/06/15  Yes Minna Merritts, MD  amitriptyline (ELAVIL) 25 MG tablet Take 25 mg by mouth at bedtime.   Yes Historical Provider, MD  aspirin EC 81 MG tablet Take 81 mg by mouth at bedtime.   Yes Historical Provider, MD  atorvastatin (LIPITOR) 40 MG tablet Take 1 tablet (40 mg total) by mouth daily at 6 PM. 09/10/15  Yes Richard Maceo Pro., MD  budesonide (RHINOCORT AQUA) 32 MCG/ACT nasal spray Place 1 spray into the nose daily as needed for rhinitis.    Yes Historical Provider, MD  cyclobenzaprine (FLEXERIL) 10 MG tablet Take 10 mg by mouth 2 (two) times  daily.    Yes Historical Provider, MD  diazepam (VALIUM) 2 MG tablet Take 1 tablet (2 mg total) by mouth 3 (three) times daily as needed for anxiety. 10/21/15  Yes Birdie Sons, MD  dutasteride (AVODART) 0.5 MG capsule Take 0.5 mg by mouth daily.     Yes Historical Provider, MD  esomeprazole (NEXIUM) 40 MG capsule Take 40 mg by mouth 2 (two) times daily.    Yes Historical Provider, MD  fexofenadine (ALLEGRA) 180 MG tablet Take 180 mg by mouth daily.     Yes Historical Provider, MD  furosemide (LASIX) 40 MG tablet Take 40 mg by mouth daily.   Yes Historical Provider, MD  gabapentin (NEURONTIN) 800 MG tablet TAKE 1 TABLET 3 TIMES A DAY 11/11/15  Yes Richard Maceo Pro., MD  glucose blood (ONE TOUCH ULTRA TEST) test strip 1 each by Other route 2 (two) times daily. Use as instructed 11/13/15  Yes Richard Maceo Pro., MD  hydrochlorothiazide (HYDRODIURIL) 12.5 MG tablet Take 12.5 mg by mouth at bedtime.   Yes Historical Provider, MD  insulin aspart (NOVOLOG) 100 UNIT/ML injection Inject 5 Units into the skin every evening.   Yes Historical Provider, MD  insulin glargine (LANTUS) 100 UNIT/ML injection Inject 25 Units into the skin at bedtime.    Yes Historical Provider,  MD  JANUVIA 100 MG tablet TAKE 1 TABLET DAILY 11/11/15  Yes Jerrol Banana., MD  Liraglutide (VICTOZA) 18 MG/3ML SOPN Inject 1.8 mg into the skin daily.   Yes Historical Provider, MD  magnesium oxide (MAG-OX) 400 (241.3 MG) MG tablet Take 1 tablet (400 mg total) by mouth 2 (two) times daily. 11/15/15  Yes Richard Maceo Pro., MD  magnesium oxide (MAG-OX) 400 MG tablet Take 1 tablet (400 mg total) by mouth daily. 11/12/15  Yes Richard Maceo Pro., MD  metFORMIN (GLUCOPHAGE) 1000 MG tablet Take 1 tablet (1,000 mg total) by mouth 2 (two) times daily. 07/24/15  Yes Richard Maceo Pro., MD  metoprolol (LOPRESSOR) 50 MG tablet Take 1 tablet (50 mg total) by mouth 2 (two) times daily. 07/07/15  Yes Minna Merritts, MD  Multiple  Vitamin (MULTIVITAMIN WITH MINERALS) TABS tablet Take 1 tablet by mouth daily.   Yes Historical Provider, MD  Multiple Vitamins-Minerals (CENTRUM SILVER ULTRA MENS PO) Take by mouth.   Yes Historical Provider, MD  NOVOLOG FLEXPEN 100 UNIT/ML FlexPen INJECT UP TO 10 UNITS 3 TIMES A DAY SUBQ 10/22/15  Yes Richard Maceo Pro., MD  NOVOTWIST 32G X 5 MM MISC USE 3 TIMES A DAY 10/17/15  Yes Richard Maceo Pro., MD  Omega-3 Fatty Acids (FISH OIL) 1200 MG CAPS Take 1,200 mg by mouth 2 (two) times daily.   Yes Historical Provider, MD  OxyCODONE (OXYCONTIN) 40 mg T12A 12 hr tablet Take 1 tablet (40 mg total) by mouth every 4 (four) hours as needed. 10/07/15  Yes Richard Maceo Pro., MD  Oxycodone HCl 20 MG TABS Take 1 tablet (20 mg total) by mouth every 4 (four) hours as needed. 07/28/15  Yes Richard Maceo Pro., MD  ranitidine (ZANTAC) 300 MG tablet Take 300 mg by mouth daily.   Yes Historical Provider, MD  testosterone cypionate (DEPOTESTOSTERONE CYPIONATE) 200 MG/ML injection Inject 200 mg into the muscle every 14 (fourteen) days.   Yes Historical Provider, MD    Patient Active Problem List   Diagnosis Date Noted  . CVA (cerebral infarction) 09/08/2015  . Dehydration 09/08/2015  . Aspiration pneumonia ([place]) 07/04/2015  . Arthritis of knee, degenerative 05/28/2015  . Chronic back pain 09/17/2014  . Leg edema 05/07/2014  . Chronic diastolic CHF (congestive heart failure) ([place]) 05/07/2014  . Campylobacter diarrhea 04/25/2013  . Atrial flutter ([place]) 01/23/2013  . Obesity 05/19/2012  . Hyperlipidemia 11/11/2011  . Diastolic dysfunction 0000000  . SOB (shortness of breath) 04/12/2011  . Diabetes mellitus type 2, uncontrolled, with complications ([place]) [id number]  . HYPERTENSION, BENIGN 03/15/2011  . DVT 03/15/2011  . TACHYCARDIA 03/15/2011    Past Medical History  Diagnosis Date  . Hypertension   . DVT (deep venous thrombosis) ([place]) 02/2010  . Diabetes mellitus   . Pulmonary embolus  ([place]) 2011  . Kidney failure   . Neuropathy ([place])   . GERD (gastroesophageal reflux disease)   . Depression   . BPH (benign prostatic hyperplasia)   . 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     x2  . TIA (transient ischemic attack)   . Seasonal allergies   . Arrhythmia     tachycardia, A-Fib  . Hypercholesteremia   . Seizures ([place])     as child   . Wears dentures     full upper and lower  . Sleep apnea     severe, no machine  . Blind right eye   .  Stiff neck     limited turning s/p titanium plate placement    Social History   Social History  . Marital Status: Married    Spouse Name: N/A  . Number of Children: N/A  . Years of Education: N/A   Occupational History  . Not on file.   Social History Main Topics  . Smoking status: Former Smoker -- 1.00 packs/day for 25 years    Types: Cigarettes    Quit date: 12/26/1989  . Smokeless tobacco: Never Used  . Alcohol Use: No  . Drug Use: No  . Sexual Activity: Not on file   Other Topics Concern  . Not on file   Social History Narrative    Allergies  Allergen Reactions  . Carisoprodol Other (See Comments)    Reaction:  Unknown     Review of Systems  Constitutional: Positive for malaise/fatigue.  HENT: Negative.   Eyes: Negative.   Respiratory: Negative.   Cardiovascular: Negative.   Gastrointestinal: Negative.   Genitourinary: Negative.   Musculoskeletal: Positive for back pain, joint pain and falls.  Skin: Negative.   Neurological: Positive for weakness.  Psychiatric/Behavioral: Negative.     Immunization History  Administered Date(s) Administered  . Influenza, High Dose Seasonal PF 09/18/2015  . Influenza-Unspecified 08/27/2014   Objective:  BP 128/64 mmHg  Pulse 100  Temp(Src) 98.2 F (36.8 C)  Resp 20  Wt 248 lb (112.492 kg)  Physical Exam  Constitutional: He is oriented to person, place, and time and well-developed, well-nourished, and in no distress.  HENT:  Head: Normocephalic and  atraumatic.  Eyes: Conjunctivae are normal. Pupils are equal, round, and reactive to light.  Neck: Neck supple.  Cardiovascular: Normal rate, regular rhythm, normal heart sounds and intact distal pulses.   No murmur heard. Pulmonary/Chest: Effort normal and breath sounds normal. No respiratory distress. He has no wheezes. He has no rales.  Abdominal: Soft.  Neurological: He is alert and oriented to person, place, and time.  Skin: Skin is warm and dry.  Psychiatric: Mood, memory, affect and judgment normal.    Lab Results  Component Value Date   WBC 5.9 09/08/2015   HGB 12.8* 09/08/2015   HCT 37.3* 09/08/2015   PLT 127* 09/08/2015   GLUCOSE 299* 09/08/2015   CHOL 192 09/09/2015   TRIG 519* 09/09/2015   HDL 29* 09/09/2015   LDLCALC UNABLE TO CALCULATE IF TRIGLYCERIDE OVER 400 mg/dL 09/09/2015   INR 1.2 04/02/2013   HGBA1C 7.9* 09/09/2015    CMP     Component Value Date/Time   NA 135 09/08/2015 1754   NA 137 02/11/2015   NA 137 04/12/2013 1150   K 3.8 09/08/2015 1754   K 4.0 04/12/2013 1150   CL 96* 09/08/2015 1754   CL 104 04/12/2013 1150   CO2 30 09/08/2015 1754   CO2 30 04/12/2013 1150   GLUCOSE 299* 09/08/2015 1754   GLUCOSE 146* 04/12/2013 1150   GLUCOSE 217* 03/09/2013 1106   BUN 16 09/08/2015 1754   BUN 29* 02/11/2015   BUN 11 04/12/2013 1150   CREATININE 1.34* 09/08/2015 1754   CREATININE 1.4* 02/11/2015   CREATININE 0.98 04/12/2013 1150   CALCIUM 9.7 09/08/2015 1754   CALCIUM 8.8 04/12/2013 1150   PROT 6.7 09/08/2015 1754   PROT 6.9 04/12/2013 1150   ALBUMIN 3.8 09/08/2015 1754   ALBUMIN 3.3* 04/12/2013 1150   AST 31 09/08/2015 1754   AST 22 04/12/2013 1150   ALT 23 09/08/2015 1754   ALT 29  04/12/2013 1150   ALKPHOS 58 09/08/2015 1754   ALKPHOS 99 04/12/2013 1150   BILITOT 0.6 09/08/2015 1754   BILITOT 0.5 04/12/2013 1150   GFRNONAA 54* 09/08/2015 1754   GFRNONAA >60 04/12/2013 1150   GFRAA >60 09/08/2015 1754   GFRAA >60 04/12/2013 1150     Assessment and Plan :  1. Uncontrolled type 2 diabetes mellitus with complication, with long-term current use of insulin (HCC) A1C 9.1 today. Increase Lantus to 30 units and Novolog to 6 units. Stopping Januvia and Metformin due to switching to more insulin. UA looks ok today except for elevated sugar level in the urine. - Comprehensive metabolic panel - POCT HgB A1C - POCT urinalysis dipstick  2. Obesity  3. Hyperlipidemia Re check levels on lipitor 40 mg. - Comprehensive metabolic panel - Lipid Panel With LDL/HDL Ratio  4. Chronic back pain Refills given. - oxyCODONE (OXYCONTIN) 40 mg 12 hr tablet; Take 1 tablet (40 mg total) by mouth every 8 (eight) hours as needed.  Dispense: 270 tablet; Refill: 0 - Oxycodone HCl 20 MG TABS; Take 1 tablet (20 mg total) by mouth every 4 (four) hours as needed.  Dispense: 100 tablet; Refill: 0  5. Chronic diastolic CHF (congestive heart failure) (HCC) Avoid Actos for DM. 6. Myalgia - CK (Creatine Kinase)  7. Neuropathy ([place])   I have done the exam and reviewed the above chart and it is accurate to the best of my knowledge.  Miguel Aschoff MD [facility] 12/10/2015 3:38 PM

## 2015-12-11 DIAGNOSIS — Z794 Long term (current) use of insulin: Secondary | ICD-10-CM | POA: Diagnosis not present

## 2015-12-11 DIAGNOSIS — E785 Hyperlipidemia, unspecified: Secondary | ICD-10-CM | POA: Diagnosis not present

## 2015-12-11 DIAGNOSIS — M791 Myalgia: Secondary | ICD-10-CM | POA: Diagnosis not present

## 2015-12-11 DIAGNOSIS — E1165 Type 2 diabetes mellitus with hyperglycemia: Secondary | ICD-10-CM | POA: Diagnosis not present

## 2015-12-11 DIAGNOSIS — E118 Type 2 diabetes mellitus with unspecified complications: Secondary | ICD-10-CM | POA: Diagnosis not present

## 2015-12-12 LAB — COMPREHENSIVE METABOLIC PANEL
ALT: 36 IU/L (ref 0–44)
AST: 22 IU/L (ref 0–40)
Albumin/Globulin Ratio: 2 (ref 1.1–2.5)
Albumin: 4.3 g/dL (ref 3.6–4.8)
Alkaline Phosphatase: 116 IU/L (ref 39–117)
BUN/Creatinine Ratio: 20 (ref 10–22)
BUN: 24 mg/dL (ref 8–27)
Bilirubin Total: 0.5 mg/dL (ref 0.0–1.2)
CO2: 32 mmol/L — ABNORMAL HIGH (ref 18–29)
Calcium: 10.3 mg/dL — ABNORMAL HIGH (ref 8.6–10.2)
Chloride: 95 mmol/L — ABNORMAL LOW (ref 96–106)
Creatinine, Ser: 1.18 mg/dL (ref 0.76–1.27)
GFR calc Af Amer: 74 mL/min/{1.73_m2} (ref >59)
GFR calc non Af Amer: 64 mL/min/{1.73_m2} (ref >59)
Globulin, Total: 2.2 g/dL (ref 1.5–4.5)
Glucose: 260 mg/dL — ABNORMAL HIGH (ref 65–99)
Potassium: 4.4 mmol/L (ref 3.5–5.2)
Sodium: 142 mmol/L (ref 134–144)
Total Protein: 6.5 g/dL (ref 6.0–8.5)

## 2015-12-12 LAB — LIPID PANEL WITH LDL/HDL RATIO
Cholesterol, Total: 89 mg/dL — ABNORMAL LOW (ref 100–199)
HDL: 26 mg/dL — ABNORMAL LOW (ref >39)
LDL Calculated: 3 mg/dL (ref 0–99)
LDl/HDL Ratio: 0.1 ratio units (ref 0.0–3.6)
Triglycerides: 298 mg/dL — ABNORMAL HIGH (ref 0–149)
VLDL Cholesterol Cal: 60 mg/dL — ABNORMAL HIGH (ref 5–40)

## 2015-12-12 LAB — CK: Total CK: 55 U/L (ref 24–204)

## 2015-12-23 ENCOUNTER — Other Ambulatory Visit: Payer: Self-pay | Admitting: Emergency Medicine

## 2015-12-23 DIAGNOSIS — IMO0002 Reserved for concepts with insufficient information to code with codable children: Secondary | ICD-10-CM

## 2015-12-23 DIAGNOSIS — Z794 Long term (current) use of insulin: Principal | ICD-10-CM

## 2015-12-23 DIAGNOSIS — E1165 Type 2 diabetes mellitus with hyperglycemia: Secondary | ICD-10-CM

## 2015-12-23 DIAGNOSIS — G629 Polyneuropathy, unspecified: Secondary | ICD-10-CM

## 2015-12-23 DIAGNOSIS — E118 Type 2 diabetes mellitus with unspecified complications: Principal | ICD-10-CM

## 2015-12-23 MED ORDER — GLUCOSE BLOOD VI STRP: 1.0000 | ORAL_STRIP | Freq: Two times a day (BID) | Status: DC

## 2015-12-23 MED ORDER — GABAPENTIN 800 MG PO TABS: 800.0000 mg | ORAL_TABLET | Freq: Three times a day (TID) | ORAL | Status: DC

## 2015-12-30 ENCOUNTER — Ambulatory Visit: Payer: Medicare Other | Admitting: Family Medicine

## 2015-12-30 DIAGNOSIS — Z87891 Personal history of nicotine dependence: Secondary | ICD-10-CM | POA: Diagnosis not present

## 2015-12-30 DIAGNOSIS — H53009 Unspecified amblyopia, unspecified eye: Secondary | ICD-10-CM | POA: Insufficient documentation

## 2015-12-30 DIAGNOSIS — Z961 Presence of intraocular lens: Secondary | ICD-10-CM | POA: Diagnosis not present

## 2015-12-30 DIAGNOSIS — E119 Type 2 diabetes mellitus without complications: Secondary | ICD-10-CM | POA: Diagnosis not present

## 2015-12-30 DIAGNOSIS — Z7982 Long term (current) use of aspirin: Secondary | ICD-10-CM | POA: Diagnosis not present

## 2015-12-30 DIAGNOSIS — H2510 Age-related nuclear cataract, unspecified eye: Secondary | ICD-10-CM | POA: Insufficient documentation

## 2015-12-30 DIAGNOSIS — H2511 Age-related nuclear cataract, right eye: Secondary | ICD-10-CM | POA: Diagnosis not present

## 2015-12-30 DIAGNOSIS — H179 Unspecified corneal scar and opacity: Secondary | ICD-10-CM | POA: Diagnosis not present

## 2015-12-30 DIAGNOSIS — Z9842 Cataract extraction status, left eye: Secondary | ICD-10-CM | POA: Diagnosis not present

## 2015-12-30 DIAGNOSIS — Z7984 Long term (current) use of oral hypoglycemic drugs: Secondary | ICD-10-CM | POA: Diagnosis not present

## 2015-12-30 DIAGNOSIS — Z9889 Other specified postprocedural states: Secondary | ICD-10-CM | POA: Diagnosis not present

## 2015-12-30 DIAGNOSIS — H53001 Unspecified amblyopia, right eye: Secondary | ICD-10-CM | POA: Diagnosis not present

## 2015-12-30 DIAGNOSIS — Z794 Long term (current) use of insulin: Secondary | ICD-10-CM | POA: Diagnosis not present

## 2015-12-30 DIAGNOSIS — H1789 Other corneal scars and opacities: Secondary | ICD-10-CM | POA: Diagnosis not present

## 2015-12-31 ENCOUNTER — Other Ambulatory Visit: Payer: Self-pay | Admitting: Family Medicine

## 2015-12-31 ENCOUNTER — Telehealth: Payer: Self-pay

## 2015-12-31 NOTE — Telephone Encounter (Signed)
Spoke with wife, patient is still having trouble with his sugar-readings are around 340, 360 and 405. They already increased both insulins and after discussing this with Dr. [name] advised wife to have patient re start metformin at 1000 mg BID. Advised her to keep check on his sugar and make sure it is better-aa

## 2016-01-01 ENCOUNTER — Other Ambulatory Visit: Payer: Self-pay | Admitting: Family Medicine

## 2016-01-01 NOTE — Telephone Encounter (Signed)
Ok to send into pharmacy? Please advise. Thanks!

## 2016-01-01 NOTE — Telephone Encounter (Signed)
Pt's wife is requesting refills on NOVOTWIST 32G X 5 MM MISC & Test Strips to be sent to [facility] Dr. Hansel Feinstein stated that since the Novotwist was increased to 5 times a day they have ran out early. Thanks TNP

## 2016-01-02 MED ORDER — INSULIN PEN NEEDLE 32G X 5 MM MISC: Status: DC

## 2016-01-05 NOTE — Telephone Encounter (Signed)
This encounter was created in error - please disregard.

## 2016-01-12 ENCOUNTER — Ambulatory Visit (INDEPENDENT_AMBULATORY_CARE_PROVIDER_SITE_OTHER): Payer: Medicare Other | Admitting: Family Medicine

## 2016-01-12 VITALS — BP 138/60 | HR 120 | Temp 98.1°F | Resp 16 | Wt 233.0 lb

## 2016-01-12 DIAGNOSIS — G8929 Other chronic pain: Secondary | ICD-10-CM | POA: Diagnosis not present

## 2016-01-12 DIAGNOSIS — E1165 Type 2 diabetes mellitus with hyperglycemia: Secondary | ICD-10-CM | POA: Diagnosis not present

## 2016-01-12 DIAGNOSIS — M549 Dorsalgia, unspecified: Secondary | ICD-10-CM

## 2016-01-12 DIAGNOSIS — Z794 Long term (current) use of insulin: Secondary | ICD-10-CM | POA: Diagnosis not present

## 2016-01-12 DIAGNOSIS — IMO0002 Reserved for concepts with insufficient information to code with codable children: Secondary | ICD-10-CM

## 2016-01-12 DIAGNOSIS — E118 Type 2 diabetes mellitus with unspecified complications: Secondary | ICD-10-CM

## 2016-01-12 DIAGNOSIS — R1013 Epigastric pain: Secondary | ICD-10-CM

## 2016-01-12 DIAGNOSIS — E669 Obesity, unspecified: Secondary | ICD-10-CM

## 2016-01-12 DIAGNOSIS — G4730 Sleep apnea, unspecified: Secondary | ICD-10-CM

## 2016-01-12 NOTE — Progress Notes (Signed)
Patient ID: Jerry Parsons, male   DOB: 1951/01/09, 65 y.o.   MRN: [id number]    Subjective:  HPI  Diabetes follow up: Patient was last seen 1 month ago. At that time his A1C was 9.1, his insulin was increased lantus ti 30units and Novolog to 6 units, and because of that Januvia and metformin was stopped. After that change patient;s sugar has been running in 300s and 400s. On january 4th metformin was added back and sugar started to decrease from 264 now to 222 this morning. Patient and wife do not feel comfortable with the sugars still. They want to hold off on patient;s knee surgery scheduled for February 24th until his sugar is stable, this makes them nervous. He has also lost weight from 1 month ago 15 lbs per our scale.  Also states he vomited this morning but wife says he took his medication without food and think this is the cause, heart rate was up this morning and now, he was very sweaty earlier today and went through 3 shirts, feels weak.  Prior to Admission medications   Medication Sig Start Date End Date Taking? Authorizing Provider  amiodarone (PACERONE) 200 MG tablet Take 1 tablet (200 mg total) by mouth daily. 02/06/15  Yes Minna Merritts, MD  amitriptyline (ELAVIL) 25 MG tablet Take 25 mg by mouth at bedtime.   Yes Historical Provider, MD  aspirin EC 81 MG tablet Take 81 mg by mouth at bedtime.   Yes Historical Provider, MD  atorvastatin (LIPITOR) 40 MG tablet Take 1 tablet (40 mg total) by mouth daily at 6 PM. 09/10/15  Yes Richard Maceo Pro., MD  budesonide (RHINOCORT AQUA) 32 MCG/ACT nasal spray Place 1 spray into the nose daily as needed for rhinitis.    Yes Historical Provider, MD  cyclobenzaprine (FLEXERIL) 10 MG tablet Take 10 mg by mouth 2 (two) times daily.    Yes Historical Provider, MD  diazepam (VALIUM) 2 MG tablet TAKE 1 TABLET BY MOUTH 3 TIMES A DAY AS NEEDED ANXIETY 01/02/16  Yes Richard Maceo Pro., MD  dutasteride (AVODART) 0.5 MG capsule Take 0.5 mg by mouth  daily.     Yes Historical Provider, MD  esomeprazole (NEXIUM) 40 MG capsule Take 40 mg by mouth 2 (two) times daily.    Yes Historical Provider, MD  fexofenadine (ALLEGRA) 180 MG tablet Take 180 mg by mouth daily.     Yes Historical Provider, MD  furosemide (LASIX) 40 MG tablet Take 40 mg by mouth daily.   Yes Historical Provider, MD  gabapentin (NEURONTIN) 800 MG tablet Take 1 tablet (800 mg total) by mouth 3 (three) times daily. 12/23/15  Yes Richard Maceo Pro., MD  glucose blood (ONE TOUCH ULTRA TEST) test strip 1 each by Other route 2 (two) times daily. Use as instructed Dx- E11.8 12/23/15  Yes Richard Maceo Pro., MD  hydrochlorothiazide (HYDRODIURIL) 12.5 MG tablet Take 12.5 mg by mouth at bedtime.   Yes Historical Provider, MD  insulin aspart (NOVOLOG) 100 UNIT/ML injection Inject 6 Units into the skin every evening.   Yes Historical Provider, MD  insulin glargine (LANTUS) 100 UNIT/ML injection Inject 30 Units into the skin at bedtime.   Yes Historical Provider, MD  Insulin Pen Needle (NOVOTWIST) 32G X 5 MM MISC USE 3 TIMES A DAY 01/02/16  Yes Richard Maceo Pro., MD  Liraglutide (VICTOZA) 18 MG/3ML SOPN Inject 1.8 mg into the skin daily.   Yes Historical Provider, MD  magnesium oxide (MAG-OX) 400 (241.3 MG) MG tablet Take 1 tablet (400 mg total) by mouth 2 (two) times daily. 11/15/15  Yes Richard Maceo Pro., MD  magnesium oxide (MAG-OX) 400 MG tablet Take 1 tablet (400 mg total) by mouth daily. 11/12/15  Yes Richard Maceo Pro., MD  metFORMIN (GLUCOPHAGE) 1000 MG tablet TAKE 1 TABLET (1,000 MG TOTAL) BY MOUTH 2 (TWO) TIMES DAILY. 11/24/15  Yes Historical Provider, MD  metoprolol (LOPRESSOR) 50 MG tablet Take 1 tablet (50 mg total) by mouth 2 (two) times daily. 07/07/15  Yes Minna Merritts, MD  Multiple Vitamin (MULTIVITAMIN WITH MINERALS) TABS tablet Take 1 tablet by mouth daily.   Yes Historical Provider, MD  Multiple Vitamins-Minerals (CENTRUM SILVER ULTRA MENS PO) Take by mouth.    Yes Historical Provider, MD  NOVOLOG FLEXPEN 100 UNIT/ML FlexPen INJECT UP TO 10 UNITS 3 TIMES A DAY SUBQ 10/22/15  Yes Richard Maceo Pro., MD  Omega-3 Fatty Acids (FISH OIL) 1200 MG CAPS Take 1,200 mg by mouth 2 (two) times daily.   Yes Historical Provider, MD  oxyCODONE (OXYCONTIN) 40 mg 12 hr tablet Take 1 tablet (40 mg total) by mouth every 8 (eight) hours as needed. 12/10/15  Yes Richard Maceo Pro., MD  Oxycodone HCl 20 MG TABS Take 1 tablet (20 mg total) by mouth every 4 (four) hours as needed. 12/10/15  Yes Richard Maceo Pro., MD  ranitidine (ZANTAC) 300 MG tablet Take 300 mg by mouth daily.   Yes Historical Provider, MD  testosterone cypionate (DEPOTESTOSTERONE CYPIONATE) 200 MG/ML injection Inject 200 mg into the muscle every 14 (fourteen) days.   Yes Historical Provider, MD    Patient Active Problem List   Diagnosis Date Noted  . CVA (cerebral infarction) 09/08/2015  . Dehydration 09/08/2015  . Aspiration pneumonia ([place]) 07/04/2015  . Paroxysmal atrial fibrillation ([place]) 07/04/2015  . Arthritis of knee, degenerative 05/28/2015  . Chronic back pain 09/17/2014  . Leg edema 05/07/2014  . Chronic diastolic CHF (congestive heart failure) ([place]) 05/07/2014  . Campylobacter diarrhea 04/25/2013  . Atrial flutter ([place]) 01/23/2013  . Obesity 05/19/2012  . Hyperlipidemia 11/11/2011  . Diastolic dysfunction 0000000  . SOB (shortness of breath) 04/12/2011  . Diabetes mellitus type 2, uncontrolled, with complications ([place]) [id number]  . HYPERTENSION, BENIGN 03/15/2011  . DVT 03/15/2011  . TACHYCARDIA 03/15/2011    Past Medical History  Diagnosis Date  . Hypertension   . DVT (deep venous thrombosis) ([place]) 02/2010  . Diabetes mellitus   . Pulmonary embolus ([place]) 2011  . Kidney failure   . Neuropathy ([place])   . GERD (gastroesophageal reflux disease)   . Depression   . BPH (benign prostatic hyperplasia)   . 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     x2  . TIA  (transient ischemic attack)   . Seasonal allergies   . Arrhythmia     tachycardia, A-Fib  . Hypercholesteremia   . Seizures ([place])     as child   . Wears dentures     full upper and lower  . Sleep apnea     severe, no machine  . Blind right eye   . Stiff neck     limited turning s/p titanium plate placement    Social History   Social History  . Marital Status: Married    Spouse Name: N/A  . Number of Children: N/A  . Years of Education: N/A   Occupational History  . Not on file.  Social History Main Topics  . Smoking status: Former Smoker -- 1.00 packs/day for 25 years    Types: Cigarettes    Quit date: 12/26/1989  . Smokeless tobacco: Never Used  . Alcohol Use: No  . Drug Use: No  . Sexual Activity: Not on file   Other Topics Concern  . Not on file   Social History Narrative    Allergies  Allergen Reactions  . Carisoprodol Other (See Comments)    Reaction:  Unknown     Review of Systems  Constitutional: Positive for weight loss and malaise/fatigue. Negative for fever and chills.  Respiratory: Negative.   Cardiovascular: Negative.   Gastrointestinal: Positive for vomiting and abdominal pain.  Musculoskeletal: Positive for back pain and joint pain. Negative for falls.  Neurological: Positive for weakness.  Psychiatric/Behavioral: The patient has insomnia.     Immunization History  Administered Date(s) Administered  . Influenza, High Dose Seasonal PF 09/18/2015  . Influenza-Unspecified 08/27/2014   Objective:  BP 138/60 mmHg  Pulse 120  Temp(Src) 98.1 F (36.7 C)  Resp 16  Wt 233 lb (105.688 kg)  Physical Exam  Constitutional: He is oriented to person, place, and time and well-developed, well-nourished, and in no distress.  HENT:  Head: Normocephalic and atraumatic.  Eyes: Conjunctivae are normal. Pupils are equal, round, and reactive to light.  Neck: Normal range of motion. Neck supple.  Cardiovascular: Normal rate, regular rhythm, normal  heart sounds and intact distal pulses.   No murmur heard. Pulmonary/Chest: Effort normal and breath sounds normal. No respiratory distress. He has no wheezes.  Abdominal: There is tenderness (epigastric/RUQ).  Musculoskeletal: He exhibits no edema or tenderness.  Neurological: He is alert and oriented to person, place, and time.  Skin: Skin is warm and dry.  Psychiatric: Mood, memory, affect and judgment normal.    Lab Results  Component Value Date   WBC 5.9 09/08/2015   HGB 12.8* 09/08/2015   HCT 37.3* 09/08/2015   PLT 127* 09/08/2015   GLUCOSE 260* 12/11/2015   CHOL 89* 12/11/2015   TRIG 298* 12/11/2015   HDL 26* 12/11/2015   LDLCALC 3 12/11/2015   TSH 2.21 04/03/2013   INR 1.2 04/02/2013   HGBA1C 9.1 12/10/2015    CMP     Component Value Date/Time   NA 142 12/11/2015 1014   NA 135 09/08/2015 1754   NA 137 04/12/2013 1150   K 4.4 12/11/2015 1014   K 4.0 04/12/2013 1150   CL 95* 12/11/2015 1014   CL 104 04/12/2013 1150   CO2 32* 12/11/2015 1014   CO2 30 04/12/2013 1150   GLUCOSE 260* 12/11/2015 1014   GLUCOSE 299* 09/08/2015 1754   GLUCOSE 146* 04/12/2013 1150   BUN 24 12/11/2015 1014   BUN 16 09/08/2015 1754   BUN 11 04/12/2013 1150   CREATININE 1.18 12/11/2015 1014   CREATININE 1.4* 02/11/2015   CREATININE 0.98 04/12/2013 1150   CALCIUM 10.3* 12/11/2015 1014   CALCIUM 8.8 04/12/2013 1150   PROT 6.5 12/11/2015 1014   PROT 6.7 09/08/2015 1754   PROT 6.9 04/12/2013 1150   ALBUMIN 4.3 12/11/2015 1014   ALBUMIN 3.8 09/08/2015 1754   ALBUMIN 3.3* 04/12/2013 1150   AST 22 12/11/2015 1014   AST 22 04/12/2013 1150   ALT 36 12/11/2015 1014   ALT 29 04/12/2013 1150   ALKPHOS 116 12/11/2015 1014   ALKPHOS 99 04/12/2013 1150   BILITOT 0.5 12/11/2015 1014   BILITOT 0.6 09/08/2015 1754   BILITOT 0.5 04/12/2013  [place] 12/11/2015 1014   GFRNONAA >60 04/12/2013 1150   GFRAA 74 12/11/2015 1014   GFRAA >60 04/12/2013 1150    Assessment and Plan :  1.  Uncontrolled type 2 diabetes mellitus with complication, with long-term current use of insulin ([place]) Sugar is better since re starting Metformin but not at goal. Advised patient and his wife to increase Lantus every 2 days by 2 units. Call in 1 week and report on sugar readings. Re check in about 3 weeks now.  2. Chronic back pain Stable.  3. Obesity  4. Epigastric/RUQ pain Will follow. May need to check Lipase, CBC, Cmet and H Pylori if symptoms do not improve or get worse.  5.Insomnia Patient has had hard time with this lately due to chronic pain worsening. Will follow for now and re check on the next visit.  Patient was seen and examined by Dr. Eulas Post and note was scribed by Theressa Millard, RMA.   Miguel Aschoff MD [facility] 01/12/2016 2:40 PM

## 2016-01-21 ENCOUNTER — Ambulatory Visit: Payer: Medicare Other | Admitting: Family Medicine

## 2016-01-21 DIAGNOSIS — M549 Dorsalgia, unspecified: Secondary | ICD-10-CM | POA: Diagnosis present

## 2016-01-21 DIAGNOSIS — R601 Generalized edema: Secondary | ICD-10-CM | POA: Diagnosis not present

## 2016-01-21 DIAGNOSIS — G9340 Encephalopathy, unspecified: Secondary | ICD-10-CM | POA: Insufficient documentation

## 2016-01-21 DIAGNOSIS — G92 Toxic encephalopathy: Secondary | ICD-10-CM | POA: Diagnosis present

## 2016-01-21 DIAGNOSIS — J189 Pneumonia, unspecified organism: Secondary | ICD-10-CM | POA: Diagnosis not present

## 2016-01-21 DIAGNOSIS — Z79899 Other long term (current) drug therapy: Secondary | ICD-10-CM | POA: Diagnosis not present

## 2016-01-21 DIAGNOSIS — R918 Other nonspecific abnormal finding of lung field: Secondary | ICD-10-CM | POA: Diagnosis not present

## 2016-01-21 DIAGNOSIS — R0902 Hypoxemia: Secondary | ICD-10-CM | POA: Diagnosis not present

## 2016-01-21 DIAGNOSIS — G8929 Other chronic pain: Secondary | ICD-10-CM | POA: Diagnosis present

## 2016-01-21 DIAGNOSIS — J9601 Acute respiratory failure with hypoxia: Secondary | ICD-10-CM | POA: Diagnosis present

## 2016-01-21 DIAGNOSIS — Z888 Allergy status to other drugs, medicaments and biological substances status: Secondary | ICD-10-CM | POA: Diagnosis not present

## 2016-01-21 DIAGNOSIS — Z794 Long term (current) use of insulin: Secondary | ICD-10-CM | POA: Diagnosis not present

## 2016-01-21 DIAGNOSIS — N179 Acute kidney failure, unspecified: Secondary | ICD-10-CM | POA: Diagnosis not present

## 2016-01-21 DIAGNOSIS — R531 Weakness: Secondary | ICD-10-CM | POA: Diagnosis not present

## 2016-01-21 DIAGNOSIS — M25569 Pain in unspecified knee: Secondary | ICD-10-CM | POA: Diagnosis present

## 2016-01-21 DIAGNOSIS — S37009A Unspecified injury of unspecified kidney, initial encounter: Secondary | ICD-10-CM | POA: Insufficient documentation

## 2016-01-21 DIAGNOSIS — Z7982 Long term (current) use of aspirin: Secondary | ICD-10-CM | POA: Diagnosis not present

## 2016-01-21 DIAGNOSIS — E1165 Type 2 diabetes mellitus with hyperglycemia: Secondary | ICD-10-CM | POA: Diagnosis present

## 2016-01-21 DIAGNOSIS — I10 Essential (primary) hypertension: Secondary | ICD-10-CM | POA: Diagnosis not present

## 2016-01-21 DIAGNOSIS — A419 Sepsis, unspecified organism: Secondary | ICD-10-CM | POA: Diagnosis not present

## 2016-01-21 DIAGNOSIS — Z87891 Personal history of nicotine dependence: Secondary | ICD-10-CM | POA: Diagnosis not present

## 2016-01-21 DIAGNOSIS — I480 Paroxysmal atrial fibrillation: Secondary | ICD-10-CM | POA: Diagnosis not present

## 2016-01-21 DIAGNOSIS — R509 Fever, unspecified: Secondary | ICD-10-CM | POA: Diagnosis not present

## 2016-01-21 DIAGNOSIS — M1990 Unspecified osteoarthritis, unspecified site: Secondary | ICD-10-CM | POA: Diagnosis present

## 2016-01-21 DIAGNOSIS — R000 Tachycardia, unspecified: Secondary | ICD-10-CM | POA: Diagnosis not present

## 2016-01-21 DIAGNOSIS — R4182 Altered mental status, unspecified: Secondary | ICD-10-CM | POA: Diagnosis present

## 2016-01-21 DIAGNOSIS — E138 Other specified diabetes mellitus with unspecified complications: Secondary | ICD-10-CM | POA: Diagnosis not present

## 2016-01-21 DIAGNOSIS — B961 Klebsiella pneumoniae [K. pneumoniae] as the cause of diseases classified elsewhere: Secondary | ICD-10-CM | POA: Diagnosis present

## 2016-01-22 ENCOUNTER — Telehealth: Payer: Self-pay

## 2016-01-22 NOTE — Telephone Encounter (Signed)
Received cardiac clearance request for pt to proceed w/ TKA - medial & lateral w/wo patella resurfacing on 02/20/16. Per Dr. Rockey Situ, pt is cleared for surgery w/ no medication recommendations. Faxed to [facility] @ [phone].

## 2016-02-02 ENCOUNTER — Ambulatory Visit: Payer: Self-pay | Admitting: Orthopedic Surgery

## 2016-02-09 ENCOUNTER — Ambulatory Visit (INDEPENDENT_AMBULATORY_CARE_PROVIDER_SITE_OTHER): Payer: Medicare Other | Admitting: Family Medicine

## 2016-02-09 ENCOUNTER — Other Ambulatory Visit (HOSPITAL_COMMUNITY): Payer: Medicare Other

## 2016-02-09 VITALS — BP 118/62 | HR 92 | Temp 98.2°F | Resp 16 | Wt 229.0 lb

## 2016-02-09 DIAGNOSIS — J189 Pneumonia, unspecified organism: Secondary | ICD-10-CM | POA: Diagnosis not present

## 2016-02-09 DIAGNOSIS — A419 Sepsis, unspecified organism: Secondary | ICD-10-CM | POA: Diagnosis not present

## 2016-02-09 DIAGNOSIS — G4733 Obstructive sleep apnea (adult) (pediatric): Secondary | ICD-10-CM | POA: Diagnosis not present

## 2016-02-09 DIAGNOSIS — N390 Urinary tract infection, site not specified: Secondary | ICD-10-CM

## 2016-02-09 DIAGNOSIS — Z794 Long term (current) use of insulin: Secondary | ICD-10-CM

## 2016-02-09 DIAGNOSIS — Z09 Encounter for follow-up examination after completed treatment for conditions other than malignant neoplasm: Secondary | ICD-10-CM

## 2016-02-09 DIAGNOSIS — E1165 Type 2 diabetes mellitus with hyperglycemia: Secondary | ICD-10-CM

## 2016-02-09 DIAGNOSIS — M549 Dorsalgia, unspecified: Secondary | ICD-10-CM | POA: Diagnosis not present

## 2016-02-09 DIAGNOSIS — IMO0002 Reserved for concepts with insufficient information to code with codable children: Secondary | ICD-10-CM

## 2016-02-09 DIAGNOSIS — E118 Type 2 diabetes mellitus with unspecified complications: Secondary | ICD-10-CM | POA: Diagnosis not present

## 2016-02-09 DIAGNOSIS — G8929 Other chronic pain: Secondary | ICD-10-CM | POA: Diagnosis not present

## 2016-02-09 LAB — POCT URINALYSIS DIPSTICK
Bilirubin, UA: NEGATIVE
Blood, UA: NEGATIVE
Glucose, UA: NEGATIVE
Ketones, UA: NEGATIVE
Leukocytes, UA: NEGATIVE
Nitrite, UA: NEGATIVE
Protein, UA: NEGATIVE
Spec Grav, UA: 1.01
Urobilinogen, UA: NEGATIVE
pH, UA: 7.5

## 2016-02-09 MED ORDER — METOPROLOL TARTRATE 100 MG PO TABS: 100.0000 mg | ORAL_TABLET | Freq: Two times a day (BID) | ORAL | Status: DC

## 2016-02-09 MED ORDER — INSULIN ASPART 100 UNIT/ML ~~LOC~~ SOLN: SUBCUTANEOUS | Status: DC

## 2016-02-09 MED ORDER — INSULIN GLARGINE 100 UNIT/ML ~~LOC~~ SOLN: SUBCUTANEOUS | Status: DC

## 2016-02-09 MED ORDER — LIRAGLUTIDE 18 MG/3ML ~~LOC~~ SOPN: 1.8000 mg | PEN_INJECTOR | Freq: Every day | SUBCUTANEOUS | Status: DC

## 2016-02-09 MED ORDER — ESOMEPRAZOLE MAGNESIUM 40 MG PO CPDR: 40.0000 mg | DELAYED_RELEASE_CAPSULE | Freq: Two times a day (BID) | ORAL | Status: DC

## 2016-02-09 MED ORDER — HYDROCHLOROTHIAZIDE 12.5 MG PO TABS: 25.0000 mg | ORAL_TABLET | Freq: Every day | ORAL | Status: DC

## 2016-02-09 MED ORDER — INSULIN PEN NEEDLE 32G X 5 MM MISC: Status: DC

## 2016-02-09 NOTE — Progress Notes (Signed)
Patient ID: Jerry Parsons, male   DOB: 08-18-1951, 65 y.o.   MRN: [id number]    Subjective:  HPI  Patient is here for hospital follow up. On January 25th wife found patient in the bathroom soaking wet in sweat, he fell of the commode and was on the floor. They ended up having to call EMS to get patient up. Patient was confused and did not know who or where he was. He was admitted to [facility] 01/21/16-01/28/16. Diagnoses were: Sepsis due to pneumonia Hypoxia DM I personally called pt/wife in the days following discharge from hospital. Medications were adjusted as follows: Lasix decreased to 20 mg, Metoprolol increased to 100 mg twice daily, Lantus increased to 22 units twice daily, Novolog increased to 6 units three times daily, stopped Diazepam and Amitriptyline. Started patient on Ceftin and he finished that on Saturday February 11th.  His sugar did improve for about a week and then readings got worse again.  Patient is having cough, his lower legs are swollen very thight, swelling does not go away over night. No shortness of breath. He lost his brother recently and had some chest discomfort dealing with taht but before that has not had trouble with that. He is still so weak.  Prior to Admission medications   Medication Sig Start Date End Date Taking? Authorizing Provider  amiodarone (PACERONE) 200 MG tablet Take 1 tablet (200 mg total) by mouth daily. 02/06/15  Yes Minna Merritts, MD  aspirin EC 81 MG tablet Take 81 mg by mouth at bedtime.   Yes Historical Provider, MD  atorvastatin (LIPITOR) 40 MG tablet Take 1 tablet (40 mg total) by mouth daily at 6 PM. 09/10/15  Yes Richard Maceo Pro., MD  budesonide (RHINOCORT AQUA) 32 MCG/ACT nasal spray Place 1 spray into the nose daily as needed for rhinitis.    Yes Historical Provider, MD  cyclobenzaprine (FLEXERIL) 10 MG tablet Take 10 mg by mouth 2 (two) times daily.    Yes Historical Provider, MD  dutasteride (AVODART) 0.5 MG  capsule Take 0.5 mg by mouth daily.     Yes Historical Provider, MD  esomeprazole (NEXIUM) 40 MG capsule Take 40 mg by mouth 2 (two) times daily.    Yes Historical Provider, MD  fexofenadine (ALLEGRA) 180 MG tablet Take 180 mg by mouth daily.     Yes Historical Provider, MD  furosemide (LASIX) 20 MG tablet Take 20 mg by mouth daily. 01/28/16  Yes Historical Provider, MD  gabapentin (NEURONTIN) 800 MG tablet Take 1 tablet (800 mg total) by mouth 3 (three) times daily. 12/23/15  Yes Richard Maceo Pro., MD  glucose blood (ONE TOUCH ULTRA TEST) test strip 1 each by Other route 2 (two) times daily. Use as instructed Dx- E11.8 12/23/15  Yes Richard Maceo Pro., MD  hydrochlorothiazide (HYDRODIURIL) 12.5 MG tablet Take 12.5 mg by mouth at bedtime.   Yes Historical Provider, MD  insulin aspart (NOVOLOG) 100 UNIT/ML injection Inject 6 Units into the skin every evening.   Yes Historical Provider, MD  insulin glargine (LANTUS) 100 UNIT/ML injection Inject 30 Units into the skin at bedtime.   Yes Historical Provider, MD  Insulin Pen Needle (NOVOTWIST) 32G X 5 MM MISC USE 3 TIMES A DAY 01/02/16  Yes Richard Maceo Pro., MD  Liraglutide (VICTOZA) 18 MG/3ML SOPN Inject 1.8 mg into the skin daily.   Yes Historical Provider, MD  magnesium oxide (MAG-OX) 400 (241.3 MG) MG tablet Take 1 tablet (400  mg total) by mouth 2 (two) times daily. 11/15/15  Yes Richard Maceo Pro., MD  magnesium oxide (MAG-OX) 400 MG tablet Take 1 tablet (400 mg total) by mouth daily. 11/12/15  Yes Richard Maceo Pro., MD  metFORMIN (GLUCOPHAGE) 1000 MG tablet TAKE 1 TABLET (1,000 MG TOTAL) BY MOUTH 2 (TWO) TIMES DAILY. 11/24/15  Yes Historical Provider, MD  metoprolol (LOPRESSOR) 100 MG tablet TAKE 1 TABLET 2 TIMES DAILY 01/28/16  Yes Historical Provider, MD  Multiple Vitamin (MULTIVITAMIN WITH MINERALS) TABS tablet Take 1 tablet by mouth daily.   Yes Historical Provider, MD  NOVOLOG FLEXPEN 100 UNIT/ML FlexPen INJECT UP TO 10 UNITS 3  TIMES A DAY SUBQ 10/22/15  Yes Richard Maceo Pro., MD  Omega-3 Fatty Acids (FISH OIL) 1200 MG CAPS Take 1,200 mg by mouth 2 (two) times daily.   Yes Historical Provider, MD  oxyCODONE (OXYCONTIN) 40 mg 12 hr tablet Take 1 tablet (40 mg total) by mouth every 8 (eight) hours as needed. 12/10/15  Yes Richard Maceo Pro., MD  Oxycodone HCl 20 MG TABS Take 1 tablet (20 mg total) by mouth every 4 (four) hours as needed. 12/10/15  Yes Richard Maceo Pro., MD  ranitidine (ZANTAC) 300 MG tablet Take 300 mg by mouth daily.   Yes Historical Provider, MD  testosterone cypionate (DEPOTESTOSTERONE CYPIONATE) 200 MG/ML injection Inject 200 mg into the muscle every 14 (fourteen) days.   Yes Historical Provider, MD    Patient Active Problem List   Diagnosis Date Noted  . Amblyopia 12/30/2015  . 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 NS (nuclear sclerosis) 12/30/2015  . Pseudoaphakia 12/30/2015  . CVA (cerebral infarction) 09/08/2015  . Dehydration 09/08/2015  . Aspiration pneumonia ([place]) 07/04/2015  . Paroxysmal atrial fibrillation ([place]) 07/04/2015  . Arthritis of knee, degenerative 05/28/2015  . Chronic back pain 09/17/2014  . Leg edema 05/07/2014  . Chronic diastolic CHF (congestive heart failure) ([place]) 05/07/2014  . Campylobacter diarrhea 04/25/2013  . Atrial flutter ([place]) 01/23/2013  . Obesity 05/19/2012  . Hyperlipidemia 11/11/2011  . Diastolic dysfunction 0000000  . SOB (shortness of breath) 04/12/2011  . Diabetes mellitus type 2, uncontrolled, with complications ([place]) [id number]  . HYPERTENSION, BENIGN 03/15/2011  . DVT 03/15/2011  . TACHYCARDIA 03/15/2011    Past Medical History  Diagnosis Date  . Hypertension   . DVT (deep venous thrombosis) ([place]) 02/2010  . Diabetes mellitus   . Pulmonary embolus ([place]) 2011  . Kidney failure   . Neuropathy ([place])   . GERD (gastroesophageal reflux disease)   . Depression   . BPH (benign prostatic hyperplasia)   . 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  jejuni     x2  . TIA (transient ischemic attack)   . Seasonal allergies   . Arrhythmia     tachycardia, A-Fib  . Hypercholesteremia   . Seizures ([place])     as child   . Wears dentures     full upper and lower  . Sleep apnea     severe, no machine  . Blind right eye   . Stiff neck     limited turning s/p titanium plate placement    Social History   Social History  . Marital Status: Married    Spouse Name: N/A  . Number of Children: N/A  . Years of Education: N/A   Occupational History  . Not on file.   Social History Main Topics  . Smoking status: Former Smoker -- 1.00 packs/day for 25 years  Types: Cigarettes    Quit date: 12/26/1989  . Smokeless tobacco: Never Used  . Alcohol Use: No  . Drug Use: No  . Sexual Activity: Not on file   Other Topics Concern  . Not on file   Social History Narrative    Allergies  Allergen Reactions  . Carisoprodol Other (See Comments)    Reaction:  Unknown     Review of Systems  Constitutional: Positive for malaise/fatigue. Negative for fever and chills.  Respiratory: Positive for cough. Negative for sputum production, shortness of breath and wheezing.   Cardiovascular: Positive for leg swelling. Negative for chest pain, palpitations and orthopnea.  Musculoskeletal: Positive for myalgias, back pain and joint pain.  Neurological: Positive for tremors and weakness. Negative for dizziness and tingling.  Psychiatric/Behavioral:       Morning death of his brother    Immunization History  Administered Date(s) Administered  . Influenza, High Dose Seasonal PF 09/18/2015  . Influenza-Unspecified 08/27/2014   Objective:  BP 118/62 mmHg  Pulse 92  Temp(Src) 98.2 F (36.8 C)  Resp 16  Wt 229 lb (103.874 kg)  SpO2 95%  Physical Exam  Constitutional: He is oriented to person, place, and time and well-developed, well-nourished, and in no distress.  HENT:  Head: Normocephalic and atraumatic.  Eyes: Conjunctivae are normal.  Pupils are equal, round, and reactive to light.  Neck: Normal range of motion. Neck supple.  Cardiovascular: Normal rate, regular rhythm, normal heart sounds and intact distal pulses.   No murmur heard. Pulmonary/Chest: Effort normal and breath sounds normal. No respiratory distress. He has no wheezes.  Neurological: He is alert and oriented to person, place, and time. He is not agitated and not disoriented. He displays weakness and tremor (right hand). He displays normal speech. No sensory deficit. Gait abnormal.  Psychiatric: Mood, memory, affect and judgment normal.    Lab Results  Component Value Date   WBC 5.9 09/08/2015   HGB 12.8* 09/08/2015   HCT 37.3* 09/08/2015   PLT 127* 09/08/2015   GLUCOSE 260* 12/11/2015   CHOL 89* 12/11/2015   TRIG 298* 12/11/2015   HDL 26* 12/11/2015   LDLCALC 3 12/11/2015   TSH 2.21 04/03/2013   INR 1.2 04/02/2013   HGBA1C 9.1 12/10/2015    CMP     Component Value Date/Time   NA 142 12/11/2015 1014   NA 135 09/08/2015 1754   NA 137 04/12/2013 1150   K 4.4 12/11/2015 1014   K 4.0 04/12/2013 1150   CL 95* 12/11/2015 1014   CL 104 04/12/2013 1150   CO2 32* 12/11/2015 1014   CO2 30 04/12/2013 1150   GLUCOSE 260* 12/11/2015 1014   GLUCOSE 299* 09/08/2015 1754   GLUCOSE 146* 04/12/2013 1150   BUN 24 12/11/2015 1014   BUN 16 09/08/2015 1754   BUN 11 04/12/2013 1150   CREATININE 1.18 12/11/2015 1014   CREATININE 1.4* 02/11/2015   CREATININE 0.98 04/12/2013 1150   CALCIUM 10.3* 12/11/2015 1014   CALCIUM 8.8 04/12/2013 1150   PROT 6.5 12/11/2015 1014   PROT 6.7 09/08/2015 1754   PROT 6.9 04/12/2013 1150   ALBUMIN 4.3 12/11/2015 1014   ALBUMIN 3.8 09/08/2015 1754   ALBUMIN 3.3* 04/12/2013 1150   AST 22 12/11/2015 1014   AST 22 04/12/2013 1150   ALT 36 12/11/2015 1014   ALT 29 04/12/2013 1150   ALKPHOS 116 12/11/2015 1014   ALKPHOS 99 04/12/2013 1150   BILITOT 0.5 12/11/2015 1014   BILITOT 0.6 09/08/2015  1754   BILITOT 0.5 04/12/2013  1150   GFRNONAA 64 12/11/2015 1014   GFRNONAA >60 04/12/2013 1150   GFRAA 74 12/11/2015 1014   GFRAA >60 04/12/2013 1150    Assessment and Plan :  1. Hospital discharge follow-up Reviewed records. Advised patient that I am glad Diazepam and Amitryptiline stopped and patient seems to be doing ok without these medications.   2. Uncontrolled type 2 diabetes mellitus with complication, with long-term current use of insulin ([place]) Will follow and not make any other changes at this time. Will continue the same regimen as hospital told patient to do.  3. Sepsis due to pneumonia [facility]) Lungs are clear today. Patient is still weak after been in the hospital for 1 week, advised patient and his wife that it will take about 2 months before he starts to feel stronger. Will follow. Advised patient to use a walker for now until he gets his strength back and at least a cane. Patient does not want to order physical therapy as of now. Recurrent pneumonia likely contributed to by aspiration due to sedating meds and OSA. 4. Urinary tract infection without hematuria, site unspecified UA is clear today. - POCT urinalysis dipstick  5. Chronic back pain Discussed with patient and his wife the possibility of cutting back on pain medications as patient is loosing weight.  6. OSA (obstructive sleep apnea) Will order CPAP and supplies again, this was done in January but for some reason not done yet. Addressed this with Judson Roch in referral to get this set up. This issue is affecting patient in other ways and we need to get him set up and get him get use to the machine and hopefully getting OSA under better control will help patient feel better in other health issues he is having. - For home use only DME continuous positive airway pressure (CPAP)  I have done the exam and reviewed the above chart and it is accurate to the best of my knowledge.  Patient was seen and examined by Dr. Eulas Post and note was  scribed by Theressa Millard, RMA.   Miguel Aschoff MD [facility] 02/09/2016 3:07 PM

## 2016-02-11 ENCOUNTER — Ambulatory Visit: Payer: Medicare Other | Admitting: Family Medicine

## 2016-02-12 ENCOUNTER — Other Ambulatory Visit: Payer: Self-pay | Admitting: Family Medicine

## 2016-02-12 MED ORDER — INSULIN ASPART 100 UNIT/ML FLEXPEN: PEN_INJECTOR | SUBCUTANEOUS | Status: DC

## 2016-02-12 NOTE — Telephone Encounter (Signed)
Spoke with wife, we had vials on file that was sent in that way. i apologized and fixed it in the chart. We have samples os that was given to the patient to make up for their cost, also provided needles and syringes for the vials if they decide to use them.-aa

## 2016-02-12 NOTE — Telephone Encounter (Signed)
Pt's wife would like a refill for NOVOLOG FLEXPEN 100 UNIT/ML FlexPen sent to [facility] order b/c she stated that we sent in the wrong RX on 02/09/16. Wife stated that pt needs the flex pen not the vials. Pt's wife would like to speak with Ana. Thanks TNP

## 2016-02-20 ENCOUNTER — Inpatient Hospital Stay: Admit: 2016-02-20 | Payer: Medicare Other | Admitting: Orthopedic Surgery

## 2016-02-20 SURGERY — ARTHROPLASTY, KNEE, TOTAL
Anesthesia: Choice | Site: Knee | Laterality: Right

## 2016-02-29 ENCOUNTER — Other Ambulatory Visit: Payer: Self-pay | Admitting: Cardiovascular Disease

## 2016-03-02 ENCOUNTER — Ambulatory Visit (INDEPENDENT_AMBULATORY_CARE_PROVIDER_SITE_OTHER): Payer: Medicare Other | Admitting: Family Medicine

## 2016-03-02 VITALS — BP 142/62 | HR 80 | Temp 98.0°F | Resp 14 | Wt 221.0 lb

## 2016-03-02 DIAGNOSIS — E118 Type 2 diabetes mellitus with unspecified complications: Secondary | ICD-10-CM | POA: Diagnosis not present

## 2016-03-02 DIAGNOSIS — G8929 Other chronic pain: Secondary | ICD-10-CM

## 2016-03-02 DIAGNOSIS — G629 Polyneuropathy, unspecified: Secondary | ICD-10-CM

## 2016-03-02 DIAGNOSIS — H60541 Acute eczematoid otitis externa, right ear: Secondary | ICD-10-CM

## 2016-03-02 DIAGNOSIS — E669 Obesity, unspecified: Secondary | ICD-10-CM

## 2016-03-02 DIAGNOSIS — M549 Dorsalgia, unspecified: Secondary | ICD-10-CM

## 2016-03-02 DIAGNOSIS — G4733 Obstructive sleep apnea (adult) (pediatric): Secondary | ICD-10-CM | POA: Diagnosis not present

## 2016-03-02 DIAGNOSIS — E1165 Type 2 diabetes mellitus with hyperglycemia: Secondary | ICD-10-CM

## 2016-03-02 DIAGNOSIS — J189 Pneumonia, unspecified organism: Secondary | ICD-10-CM

## 2016-03-02 DIAGNOSIS — Z794 Long term (current) use of insulin: Secondary | ICD-10-CM

## 2016-03-02 DIAGNOSIS — A419 Sepsis, unspecified organism: Secondary | ICD-10-CM | POA: Diagnosis not present

## 2016-03-02 DIAGNOSIS — IMO0002 Reserved for concepts with insufficient information to code with codable children: Secondary | ICD-10-CM

## 2016-03-02 MED ORDER — MOMETASONE FUROATE 0.1 % EX CREA: 1.0000 "application " | TOPICAL_CREAM | Freq: Every day | CUTANEOUS | Status: DC

## 2016-03-02 MED ORDER — OXYCODONE HCL ER 40 MG PO T12A: 40.0000 mg | EXTENDED_RELEASE_TABLET | Freq: Three times a day (TID) | ORAL | Status: DC | PRN

## 2016-03-02 MED ORDER — INSULIN PEN NEEDLE 32G X 5 MM MISC: Status: DC

## 2016-03-02 NOTE — Progress Notes (Signed)
Patient ID: Jerry Parsons, male   DOB: 10-28-1951, 65 y.o.   MRN: [id number]    Subjective:  HPI  Patient is here for follow up:  Patient states he feels much better. He still has some leg weakness and also has developed severe numbness and tingling in both feet. He has gotten the Bipap about 3 weeks ago and is doing well on it. He is using the BiPAP nightly and feels much better. He is very pleased with start of this. He wanted to have his right ear looked at, there is some dry/irritated skin on the inside of the ear.  Prior to Admission medications   Medication Sig Start Date End Date Taking? Authorizing Provider  amiodarone (PACERONE) 200 MG tablet TAKE 1 TABLET (200 MG TOTAL) BY MOUTH 2 (TWO) TIMES DAILY. 03/01/16  Yes Minna Merritts, MD  aspirin EC 81 MG tablet Take 81 mg by mouth at bedtime.   Yes Historical Provider, MD  atorvastatin (LIPITOR) 40 MG tablet Take 1 tablet (40 mg total) by mouth daily at 6 PM. 09/10/15  Yes Brantley Naser Maceo Pro., MD  budesonide (RHINOCORT AQUA) 32 MCG/ACT nasal spray Place 1 spray into the nose daily as needed for rhinitis.    Yes Historical Provider, MD  cyclobenzaprine (FLEXERIL) 10 MG tablet Take 10 mg by mouth 2 (two) times daily.    Yes Historical Provider, MD  dutasteride (AVODART) 0.5 MG capsule Take 0.5 mg by mouth daily.     Yes Historical Provider, MD  esomeprazole (NEXIUM) 40 MG capsule Take 1 capsule (40 mg total) by mouth 2 (two) times daily. 02/09/16  Yes Carlena Ruybal Maceo Pro., MD  fexofenadine (ALLEGRA) 180 MG tablet Take 180 mg by mouth daily.     Yes Historical Provider, MD  furosemide (LASIX) 20 MG tablet Take 40 mg by mouth daily.  01/28/16  Yes Historical Provider, MD  gabapentin (NEURONTIN) 800 MG tablet Take 1 tablet (800 mg total) by mouth 3 (three) times daily. 12/23/15  Yes Libbey Duce Maceo Pro., MD  glucose blood (ONE TOUCH ULTRA TEST) test strip 1 each by Other route 2 (two) times daily. Use as instructed Dx- E11.8 12/23/15  Yes  Delynda Sepulveda Maceo Pro., MD  hydrochlorothiazide (HYDRODIURIL) 12.5 MG tablet Take 2 tablets (25 mg total) by mouth at bedtime. 02/09/16  Yes Trung Wenzl Maceo Pro., MD  insulin aspart (NOVOLOG FLEXPEN) 100 UNIT/ML FlexPen 6 units three times daily-samples given at this time 02/12/16  Yes Tally Mattox Maceo Pro., MD  Insulin Pen Needle (NOVOTWIST) 32G X 5 MM MISC USE 3 TIMES A DAY 02/09/16  Yes Jerrol Banana., MD  LANTUS SOLOSTAR 100 UNIT/ML Solostar Pen  02/09/16  Yes Historical Provider, MD  Liraglutide (VICTOZA) 18 MG/3ML SOPN Inject 0.3 mLs (1.8 mg total) into the skin daily. 02/09/16  Yes Suvan Stcyr Maceo Pro., MD  magnesium oxide (MAG-OX) 400 (241.3 MG) MG tablet Take 1 tablet (400 mg total) by mouth 2 (two) times daily. 11/15/15  Yes Endi Lagman Maceo Pro., MD  magnesium oxide (MAG-OX) 400 MG tablet Take 1 tablet (400 mg total) by mouth daily. 11/12/15  Yes Kaira Stringfield Maceo Pro., MD  metFORMIN (GLUCOPHAGE) 1000 MG tablet TAKE 1 TABLET (1,000 MG TOTAL) BY MOUTH 2 (TWO) TIMES DAILY. 11/24/15  Yes Historical Provider, MD  metoprolol (LOPRESSOR) 100 MG tablet Take 1 tablet (100 mg total) by mouth 2 (two) times daily. 02/09/16  Yes Veronica Fretz Maceo Pro., MD  Multiple Vitamin (MULTIVITAMIN  WITH MINERALS) TABS tablet Take 1 tablet by mouth daily.   Yes Historical Provider, MD  NOVOLOG FLEXPEN 100 UNIT/ML FlexPen INJECT UP TO 10 UNITS 3 TIMES A DAY SUBQ 10/22/15  Yes Xeng Kucher Maceo Pro., MD  Omega-3 Fatty Acids (FISH OIL) 1200 MG CAPS Take 1,200 mg by mouth 2 (two) times daily.   Yes Historical Provider, MD  oxyCODONE (OXYCONTIN) 40 mg 12 hr tablet Take 1 tablet (40 mg total) by mouth every 8 (eight) hours as needed. 12/10/15  Yes Beverely Suen Maceo Pro., MD  Oxycodone HCl 20 MG TABS Take 1 tablet (20 mg total) by mouth every 4 (four) hours as needed. 12/10/15  Yes Sayge Brienza Maceo Pro., MD  ranitidine (ZANTAC) 300 MG tablet Take 300 mg by mouth daily.   Yes Historical Provider, MD  testosterone cypionate  (DEPOTESTOSTERONE CYPIONATE) 200 MG/ML injection Inject 200 mg into the muscle every 14 (fourteen) days.   Yes Historical Provider, MD    Patient Active Problem List   Diagnosis Date Noted  . Injury of kidney 01/21/2016  . Amblyopia 12/30/2015  . 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 NS (nuclear sclerosis) 12/30/2015  . Pseudoaphakia 12/30/2015  . CVA (cerebral infarction) 09/08/2015  . Dehydration 09/08/2015  . Aspiration pneumonia ([place]) 07/04/2015  . Paroxysmal atrial fibrillation ([place]) 07/04/2015  . Arthritis of knee, degenerative 05/28/2015  . Chronic back pain 09/17/2014  . Leg edema 05/07/2014  . Chronic diastolic CHF (congestive heart failure) ([place]) 05/07/2014  . Campylobacter diarrhea 04/25/2013  . Atrial flutter ([place]) 01/23/2013  . Obesity 05/19/2012  . Hyperlipidemia 11/11/2011  . Diastolic dysfunction 0000000  . SOB (shortness of breath) 04/12/2011  . Diabetes mellitus type 2, uncontrolled, with complications ([place]) [id number]  . HYPERTENSION, BENIGN 03/15/2011  . DVT 03/15/2011  . TACHYCARDIA 03/15/2011    Past Medical History  Diagnosis Date  . Hypertension   . DVT (deep venous thrombosis) ([place]) 02/2010  . Diabetes mellitus   . Pulmonary embolus ([place]) 2011  . Kidney failure   . Neuropathy ([place])   . GERD (gastroesophageal reflux disease)   . Depression   . BPH (benign prostatic hyperplasia)   . 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     x2  . TIA (transient ischemic attack)   . Seasonal allergies   . Arrhythmia     tachycardia, A-Fib  . Hypercholesteremia   . Seizures ([place])     as child   . Wears dentures     full upper and lower  . Sleep apnea     severe, no machine  . Blind right eye   . Stiff neck     limited turning s/p titanium plate placement    Social History   Social History  . Marital Status: Married    Spouse Name: N/A  . Number of Children: N/A  . Years of Education: N/A   Occupational History  . Not on file.   Social History Main  Topics  . Smoking status: Former Smoker -- 1.00 packs/day for 25 years    Types: Cigarettes    Quit date: 12/26/1989  . Smokeless tobacco: Never Used  . Alcohol Use: No  . Drug Use: No  . Sexual Activity: Not on file   Other Topics Concern  . Not on file   Social History Narrative    Allergies  Allergen Reactions  . Carisoprodol Other (See Comments)    Reaction:  Unknown     Review of Systems  Constitutional: Positive for malaise/fatigue.  Respiratory: Negative.   Cardiovascular: Negative.   Gastrointestinal: Negative.   Musculoskeletal: Positive for back pain and joint pain. Negative for falls.  Neurological: Positive for tingling and weakness (better).  Psychiatric/Behavioral: Negative.     Immunization History  Administered Date(s) Administered  . Influenza, High Dose Seasonal PF 09/18/2015  . Influenza-Unspecified 08/27/2014   Objective:  BP 142/62 mmHg  Pulse 80  Temp(Src) 98 F (36.7 C)  Resp 14  Wt 221 lb (100.245 kg)  SpO2 97%  Physical Exam  Constitutional: He is oriented to person, place, and time and well-developed, well-nourished, and in no distress.  HENT:  Head: Normocephalic and atraumatic.  Left Ear: External ear normal.  Eczema of the right pinna  Eyes: Conjunctivae are normal. Pupils are equal, round, and reactive to light.  Neck: Normal range of motion. Neck supple.  Cardiovascular: Normal rate, regular rhythm, normal heart sounds and intact distal pulses.   No murmur heard. Pulmonary/Chest: Effort normal and breath sounds normal. No respiratory distress. He has no wheezes.  Abdominal: Soft.  Neurological: He is alert and oriented to person, place, and time.  Skin: Skin is warm and dry.  Psychiatric: Mood, memory, affect and judgment normal.    Lab Results  Component Value Date   WBC 5.9 09/08/2015   HGB 12.8* 09/08/2015   HCT 37.3* 09/08/2015   PLT 127* 09/08/2015   GLUCOSE 260* 12/11/2015   CHOL 89* 12/11/2015   TRIG 298*  12/11/2015   HDL 26* 12/11/2015   LDLCALC 3 12/11/2015   TSH 2.21 04/03/2013   INR 1.2 04/02/2013   HGBA1C 9.1 12/10/2015    CMP     Component Value Date/Time   NA 142 12/11/2015 1014   NA 135 09/08/2015 1754   NA 137 04/12/2013 1150   K 4.4 12/11/2015 1014   K 4.0 04/12/2013 1150   CL 95* 12/11/2015 1014   CL 104 04/12/2013 1150   CO2 32* 12/11/2015 1014   CO2 30 04/12/2013 1150   GLUCOSE 260* 12/11/2015 1014   GLUCOSE 299* 09/08/2015 1754   GLUCOSE 146* 04/12/2013 1150   BUN 24 12/11/2015 1014   BUN 16 09/08/2015 1754   BUN 11 04/12/2013 1150   CREATININE 1.18 12/11/2015 1014   CREATININE 1.4* 02/11/2015   CREATININE 0.98 04/12/2013 1150   CALCIUM 10.3* 12/11/2015 1014   CALCIUM 8.8 04/12/2013 1150   PROT 6.5 12/11/2015 1014   PROT 6.7 09/08/2015 1754   PROT 6.9 04/12/2013 1150   ALBUMIN 4.3 12/11/2015 1014   ALBUMIN 3.8 09/08/2015 1754   ALBUMIN 3.3* 04/12/2013 1150   AST 22 12/11/2015 1014   AST 22 04/12/2013 1150   ALT 36 12/11/2015 1014   ALT 29 04/12/2013 1150   ALKPHOS 116 12/11/2015 1014   ALKPHOS 99 04/12/2013 1150   BILITOT 0.5 12/11/2015 1014   BILITOT 0.6 09/08/2015 1754   BILITOT 0.5 04/12/2013 1150   GFRNONAA 64 12/11/2015 1014   GFRNONAA >60 04/12/2013 1150   GFRAA 74 12/11/2015 1014   GFRAA >60 04/12/2013 1150    Assessment and Plan :  1. Sepsis due to pneumonia ([place]) Feels much better. Will continue to improve.  2. OSA (obstructive sleep apnea) Feels much better on Bipap that he got in February. Patient is tolerating it well and he feels better since using this machine every night. Patient looks much better today.  3. Obesity Patient has lost 8 lbs in 1 month. Patient is working on habits, intentional weight loss. Continue working on habits.  Follow.  4. Neuropathy (H[facility]) Worsening. Monofilament exam performed today-some decreased sensation (see foot exam in quality metrics).  5. Uncontrolled type 2 diabetes mellitus with complication,  with long-term current use of insulin ([facility]) Will check A1C on the next visit. Too early today.  6. Eczema of external ear, right Not infected on the exam today. Follow as needed.  7. Chronic back pain As patient continues to loose weight he may be able to stop some of the pain medications. Follow.   I have done the exam and reviewed the above chart and it is accurate to the best of my knowledge.  Patient was seen and examined by Dr. Eulas Post and note was scribed by Theressa Millard, RMA.    Miguel Aschoff MD [facility] 03/02/2016 1:58 PM

## 2016-03-04 DIAGNOSIS — E291 Testicular hypofunction: Secondary | ICD-10-CM | POA: Diagnosis not present

## 2016-03-04 DIAGNOSIS — N401 Enlarged prostate with lower urinary tract symptoms: Secondary | ICD-10-CM | POA: Diagnosis not present

## 2016-03-04 DIAGNOSIS — M47816 Spondylosis without myelopathy or radiculopathy, lumbar region: Secondary | ICD-10-CM | POA: Diagnosis not present

## 2016-03-04 DIAGNOSIS — R3916 Straining to void: Secondary | ICD-10-CM | POA: Diagnosis not present

## 2016-03-04 DIAGNOSIS — R3914 Feeling of incomplete bladder emptying: Secondary | ICD-10-CM | POA: Diagnosis not present

## 2016-03-04 DIAGNOSIS — M4806 Spinal stenosis, lumbar region: Secondary | ICD-10-CM | POA: Diagnosis not present

## 2016-03-04 DIAGNOSIS — Z125 Encounter for screening for malignant neoplasm of prostate: Secondary | ICD-10-CM | POA: Diagnosis not present

## 2016-03-04 DIAGNOSIS — Z79899 Other long term (current) drug therapy: Secondary | ICD-10-CM | POA: Diagnosis not present

## 2016-03-13 ENCOUNTER — Other Ambulatory Visit: Payer: Self-pay | Admitting: Family Medicine

## 2016-03-17 ENCOUNTER — Other Ambulatory Visit: Payer: Self-pay | Admitting: Family Medicine

## 2016-03-19 ENCOUNTER — Ambulatory Visit (INDEPENDENT_AMBULATORY_CARE_PROVIDER_SITE_OTHER): Payer: Medicare Other | Admitting: Cardiovascular Disease

## 2016-03-19 ENCOUNTER — Encounter: Payer: Self-pay | Admitting: Cardiovascular Disease

## 2016-03-19 VITALS — BP 120/62 | HR 68 | Ht 71.0 in | Wt 232.0 lb

## 2016-03-19 DIAGNOSIS — E118 Type 2 diabetes mellitus with unspecified complications: Secondary | ICD-10-CM

## 2016-03-19 DIAGNOSIS — I10 Essential (primary) hypertension: Secondary | ICD-10-CM

## 2016-03-19 DIAGNOSIS — J690 Pneumonitis due to inhalation of food and vomit: Secondary | ICD-10-CM | POA: Diagnosis not present

## 2016-03-19 DIAGNOSIS — E785 Hyperlipidemia, unspecified: Secondary | ICD-10-CM

## 2016-03-19 DIAGNOSIS — I480 Paroxysmal atrial fibrillation: Secondary | ICD-10-CM | POA: Diagnosis not present

## 2016-03-19 DIAGNOSIS — E1165 Type 2 diabetes mellitus with hyperglycemia: Secondary | ICD-10-CM

## 2016-03-19 DIAGNOSIS — I5032 Chronic diastolic (congestive) heart failure: Secondary | ICD-10-CM | POA: Diagnosis not present

## 2016-03-19 DIAGNOSIS — IMO0002 Reserved for concepts with insufficient information to code with codable children: Secondary | ICD-10-CM

## 2016-03-19 DIAGNOSIS — Z794 Long term (current) use of insulin: Secondary | ICD-10-CM

## 2016-03-19 MED ORDER — FUROSEMIDE 40 MG PO TABS: 40.0000 mg | ORAL_TABLET | Freq: Two times a day (BID) | ORAL | Status: DC | PRN

## 2016-03-19 NOTE — Patient Instructions (Signed)
You are doing well. No medication changes were made.  Please take extra lasix after lunch as needed for leg edema  Please call us if you have new issues that need to be addressed before your next appt.  Your physician wants you to follow-up in: 6 months.  You will receive a reminder letter in the mail two months in advance. If you don't receive a letter, please call our office to schedule the follow-up appointment.

## 2016-03-19 NOTE — Assessment & Plan Note (Signed)
We have encouraged continued exercise, careful diet management in an effort to lose weight. 

## 2016-03-19 NOTE — Assessment & Plan Note (Signed)
Blood pressure is well controlled on today's visit. No changes made to the medications. 

## 2016-03-19 NOTE — Progress Notes (Signed)
Patient ID: Jerry Parsons, male    DOB: September 21, 1951, 65 y.o.   MRN: [id number]  HPI Comments: Jerry Parsons is a very pleasant 65 year old gentleman, patient of Dr. [name], with a history of multiple back surgeries, spinal stenosis , obesity, poorly controlled diabetes , DVT, PE in March 2011 previously on warfarin, 30 year smoking history,  hypertension, depression, and history of renal disease, s/p arthroscopic knee surgery,   presenting for routine followup of his hypertension and cardiac risk factors. . History of obstructive sleep apnea, does not tolerate CPAP, chronic sweating at nighttime which he attributes to the Vicodin.  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  In follow-up today, he reports [place] He was in ICU for 3 days, long recovery, still weak Lost more than 30 pounds, gained 10 pounds back he does have lower extremity edema, sore on the top of his foot Weight is down 20 pounds today compared to his prior clinic visit Lipid panel done while he was very ill early 2017, total cholesterol at that time 89, LDL of 3 Currently denies any symptoms concerning for angina Denies any atrial fibrillation through his hospital course, reports he was changed to amiodarone IV infusion and then back to pill when he was tolerating oral medications  EKG on today's visit shows normal sinus rhythm with rate 68 bpm, nonspecific T wave abnormality  Other past medical history Previous event where he had buckling of his knee, fall, contusion, TIA-type symptoms that took him to the hospital. He was kept overnight and most of his workup was relatively unrevealing including MRI/MRA, carotid ultrasound, echocardiogram.  Currently takes aspirin, Plavix  History of atrial flutter, status post cardioversion on 03/14/2013.   2 admissions to the hospital for gastroenteritis/ campylobacter infection. Required a long course of antibiotics. Possible  infection from  tainted chicken.  Continues on amiodarone 200 mg daily, metoprolol tartrate 50 mg twice a day (no longer taking anticoagulation)   Previously on prednisone for his "legs". Sugars ran high. Previously developed leg swelling. Wife went away and he sat in a recliner with his feet down for close to 2 weeks watching TV, drinking fluids.  Creatinine at the end of April was 1.5, BUN 27, possibly from overdiuresis  cardiac workup in the 1990s:  cardiac catheterization with "minimal blockage". Previous DVT , he did have shortness of breath.  echocardiogram March 2011 shows normal LV and RV systolic function, mild LVH, diastolic dysfunction, mildly dilated left atrium.  Allergies  Allergen Reactions  . Carisoprodol Other (See Comments)    Reaction:  Unknown     Outpatient Encounter Prescriptions as of 03/19/2016  Medication Sig  . amiodarone (PACERONE) 200 MG tablet TAKE 1 TABLET (200 MG TOTAL) BY MOUTH 2 (TWO) TIMES DAILY.  [name] aspirin EC 81 MG tablet Take 81 mg by mouth at bedtime.  [name] atorvastatin (LIPITOR) 40 MG tablet Take 1 tablet (40 mg total) by mouth daily at 6 PM.  . budesonide (RHINOCORT AQUA) 32 MCG/ACT nasal spray Place 1 spray into the nose daily as needed for rhinitis.   . cyclobenzaprine (FLEXERIL) 10 MG tablet Take 10 mg by mouth 2 (two) times daily.   [name] dutasteride (AVODART) 0.5 MG capsule Take 0.5 mg by mouth daily.    [name] esomeprazole (NEXIUM) 40 MG capsule TAKE 1 CAPSULE TWICE DAILY  . fexofenadine (ALLEGRA) 180 MG tablet Take 180 mg by mouth daily.    . furosemide (LASIX) 40 MG tablet  Take 1 tablet (40 mg total) by mouth 2 (two) times daily as needed.  . gabapentin (NEURONTIN) 800 MG tablet Take 1 tablet (800 mg total) by mouth 3 (three) times daily. (Patient taking differently: Take 800 mg by mouth 4 (four) times daily. )  . glucose blood (ONE TOUCH ULTRA TEST) test strip 1 each by Other route 2 (two) times daily. Use as instructed Dx- E11.8  . hydrochlorothiazide (HYDRODIURIL)  12.5 MG tablet Take 2 tablets (25 mg total) by mouth at bedtime. (Patient taking differently: Take 12.5 mg by mouth at bedtime. )  . insulin aspart (NOVOLOG FLEXPEN) 100 UNIT/ML FlexPen 6 units three times daily-samples given at this time  . LANTUS SOLOSTAR 100 UNIT/ML Solostar Pen 22 Units daily at 10 pm.   . Liraglutide (VICTOZA) 18 MG/3ML SOPN Inject 0.3 mLs (1.8 mg total) into the skin daily.  . magnesium oxide (MAG-OX) 400 (241.3 MG) MG tablet Take 1 tablet (400 mg total) by mouth 2 (two) times daily. (Patient taking differently: Take 800 mg by mouth 2 (two) times daily. )  . metFORMIN (GLUCOPHAGE) 1000 MG tablet TAKE 1 TABLET (1,000 MG TOTAL) BY MOUTH 2 (TWO) TIMES DAILY.  . metoprolol (LOPRESSOR) 100 MG tablet Take 1 tablet (100 mg total) by mouth 2 (two) times daily.  . mometasone (ELOCON) 0.1 % cream Apply 1 application topically daily. For 2 weeks  . Multiple Vitamin (MULTIVITAMIN WITH MINERALS) TABS tablet Take 1 tablet by mouth daily.  [name] NOVOLOG FLEXPEN 100 UNIT/ML FlexPen INJECT UP TO 10 UNITS 3 TIMES A DAY SUBQ (Patient taking differently: INJECT UP TO 6 UNITS 3 TIMES A DAY SUBQ)  . Omega-3 Fatty Acids (FISH OIL) 1200 MG CAPS Take 1,200 mg by mouth 2 (two) times daily.  [name] oxyCODONE (OXYCONTIN) 40 mg 12 hr tablet Take 1 tablet (40 mg total) by mouth every 8 (eight) hours as needed.  . ranitidine (ZANTAC) 300 MG tablet Take 300 mg by mouth daily.  [name] testosterone cypionate (DEPOTESTOSTERONE CYPIONATE) 200 MG/ML injection Inject 200 mg into the muscle every 21 ( twenty-one) days.   . [DISCONTINUED] furosemide (LASIX) 20 MG tablet Take 40 mg by mouth daily.   . [DISCONTINUED] amitriptyline (ELAVIL) 25 MG tablet Reported on 03/19/2016  . [DISCONTINUED] Insulin Pen Needle (NOVOTWIST) 32G X 5 MM MISC USE 6 TIMES A DAY DX E11.9 (Patient not taking: Reported on 03/19/2016)  . [DISCONTINUED] JANUVIA 100 MG tablet Reported on 03/19/2016  . [DISCONTINUED] magnesium oxide (MAG-OX) 400 MG tablet Take 1  tablet (400 mg total) by mouth daily. (Patient not taking: Reported on 03/19/2016)  . [DISCONTINUED] Oxycodone HCl 20 MG TABS Take 1 tablet (20 mg total) by mouth every 4 (four) hours as needed. (Patient not taking: Reported on 03/19/2016)  . [DISCONTINUED] ranitidine (ZANTAC) 300 MG capsule 1 CAPSULE CAPSULE BY MOUTH DAILY (Patient not taking: Reported on 03/19/2016)   No facility-administered encounter medications on file as of 03/19/2016.    Past Medical History  Diagnosis Date  . Hypertension   . DVT (deep venous thrombosis) ([place]) 02/2010  . Diabetes mellitus   . Pulmonary embolus ([place]) 2011  . Kidney failure   . Neuropathy ([place])   . GERD (gastroesophageal reflux disease)   . Depression   . BPH (benign prostatic hyperplasia)   . 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     x2  . TIA (transient ischemic attack)   . Seasonal allergies   . Arrhythmia     tachycardia, A-Fib  . Hypercholesteremia   .  Seizures ([place])     as child   . Wears dentures     full upper and lower  . Sleep apnea     severe, no machine  . Blind right eye   . Stiff neck     limited turning s/p titanium plate placement    Past Surgical History  Procedure Laterality Date  . Gallbladder surgery  2002  . Rotator cuff repair  2001  . Back surgery      x 8; upper x 3 & lower x 5  . Cardioversion  03/14/13, 10/16    2014 - [place], 2016 - [place]  . Knee arthroscopy  2012, 2016    right - 2012, left 2016  . Cataract extraction w/phaco Left 10/29/2015    Procedure: CATARACT EXTRACTION PHACO AND INTRAOCULAR LENS PLACEMENT (IOC);  Surgeon: Leandrew Koyanagi, MD;  Location: [facility];  Service: Ophthalmology;  Laterality: Left;  DIABETIC - insulin and oral meds Sleep apnea - no machine    Social History  reports that he quit smoking about 26 years ago. His smoking use included Cigarettes. He has a 25 pack-year smoking history. He has never used smokeless tobacco. He reports that he does not drink alcohol  or use illicit drugs.  Family History family history includes Heart attack in his brother.  Review of Systems  Constitutional: Negative.   Respiratory: Negative.   Cardiovascular: Negative.   Gastrointestinal: Negative.   Musculoskeletal: Positive for back pain, arthralgias and gait problem.  Skin: Negative.   Neurological: Negative.   Hematological: Negative.   Psychiatric/Behavioral: Negative.   All other systems reviewed and are negative.  BP 120/62 mmHg  Pulse 68  Ht 5\' 11"  (1.803 m)  Wt 232 lb (105.235 kg)  BMI 32.37 kg/m2  Physical Exam  Constitutional: He is oriented to person, place, and time. He appears well-developed and well-nourished.  Obese  HENT:  Head: Normocephalic.  Nose: Nose normal.  Mouth/Throat: Oropharynx is clear and moist.  Eyes: Conjunctivae are normal. Pupils are equal, round, and reactive to light.  Neck: Normal range of motion. Neck supple. No JVD present.  Cardiovascular: Normal rate, regular rhythm, S1 normal, S2 normal, normal heart sounds and intact distal pulses.  Exam reveals no gallop and no friction rub.   No murmur heard. Pulmonary/Chest: Effort normal and breath sounds normal. No respiratory distress. He has no wheezes. He has no rales. He exhibits no tenderness.  Abdominal: Soft. Bowel sounds are normal. He exhibits no distension. There is no tenderness.  Musculoskeletal: Normal range of motion. He exhibits no edema or tenderness.  Lymphadenopathy:    He has no cervical adenopathy.  Neurological: He is alert and oriented to person, place, and time. Coordination normal.  Skin: Skin is warm and dry. No rash noted. No erythema.  Psychiatric: He has a normal mood and affect. His behavior is normal. Judgment and thought content normal.      Assessment and Plan   Nursing note and vitals reviewed.

## 2016-03-19 NOTE — Assessment & Plan Note (Signed)
Very low lipid panel earlier this year while ill recommended he stay on his statin, recheck numbers in 6 months time   Total encounter time more than 25 minutes  Greater than 50% was spent in counseling and coordination of care with the patient

## 2016-03-19 NOTE — Assessment & Plan Note (Signed)
Clinical exam with trace  pitting edema of the lower extremities Recommended he take extra Lasix after lunch periodically for leg edema and weight gain Also consider wearing compression hose

## 2016-03-19 NOTE — Assessment & Plan Note (Signed)
Previous episodes of atrial flutter. He reports having atrial fibrillation in postop in the setting of severe pain  following back surgery 2014. EKG unavailable. This was done in [place], possibly admitted to [facility]. He needed cardioversion at the time. Denies having any further arrhythmia since that time. No medication changes made today. Currently not on anticoagulation. He will stay on amiodarone.  

## 2016-03-19 NOTE — Assessment & Plan Note (Signed)
He was told that he had aspiration event January 2017, long hospital course Long discussion about the details with patient and his wife

## 2016-03-24 DIAGNOSIS — R351 Nocturia: Secondary | ICD-10-CM | POA: Diagnosis not present

## 2016-03-24 DIAGNOSIS — E291 Testicular hypofunction: Secondary | ICD-10-CM | POA: Diagnosis not present

## 2016-03-24 DIAGNOSIS — N401 Enlarged prostate with lower urinary tract symptoms: Secondary | ICD-10-CM | POA: Diagnosis not present

## 2016-03-30 ENCOUNTER — Encounter: Payer: Self-pay | Admitting: Family Medicine

## 2016-03-30 ENCOUNTER — Ambulatory Visit (INDEPENDENT_AMBULATORY_CARE_PROVIDER_SITE_OTHER): Payer: Medicare Other | Admitting: Family Medicine

## 2016-03-30 VITALS — BP 130/78 | Temp 98.5°F | Resp 16 | Wt 229.0 lb

## 2016-03-30 DIAGNOSIS — E118 Type 2 diabetes mellitus with unspecified complications: Secondary | ICD-10-CM

## 2016-03-30 DIAGNOSIS — E1165 Type 2 diabetes mellitus with hyperglycemia: Secondary | ICD-10-CM | POA: Diagnosis not present

## 2016-03-30 DIAGNOSIS — Z794 Long term (current) use of insulin: Secondary | ICD-10-CM | POA: Diagnosis not present

## 2016-03-30 DIAGNOSIS — IMO0002 Reserved for concepts with insufficient information to code with codable children: Secondary | ICD-10-CM

## 2016-03-30 LAB — POCT GLYCOSYLATED HEMOGLOBIN (HGB A1C)
Est. average glucose Bld gHb Est-mCnc: 134
Hemoglobin A1C: 6.3

## 2016-03-30 NOTE — Progress Notes (Signed)
Patient ID: Jerry Parsons, male   DOB: Jan 15, 1951, 65 y.o.   MRN: [id number]       [name]    DOB: 07/24/1951   65 y.o.   MRN: [id number] Visit Date: 03/30/2016  Today's Provider: [name], MD   Chief Complaint  Patient presents with  . Diabetes  . Pain    Chronic pain f/u   Subjective:    HPI  Patient comes in today for a follow up: He reports that he is still doing well on BiPap. He reports that he uses it 99% of the time. He is also due for his routine HgbA1c check. Patient reports that he has been compliant with all medications, and his blood sugars have averaged in the 140s. Patient reports still continues with weight loss. He has lost 3lbs since his last OV.   he feels great since starting his BiPAP.  Allergies  Allergen Reactions  . Carisoprodol Other (See Comments)    Reaction:  Unknown    Previous Medications   AMIODARONE (PACERONE) 200 MG TABLET    TAKE 1 TABLET (200 MG TOTAL) BY MOUTH 2 (TWO) TIMES DAILY.   ASPIRIN EC 81 MG TABLET    Take 81 mg by mouth at bedtime.   ATORVASTATIN (LIPITOR) 40 MG TABLET    Take 1 tablet (40 mg total) by mouth daily at 6 PM.   BUDESONIDE (RHINOCORT AQUA) 32 MCG/ACT NASAL SPRAY    Place 1 spray into the nose daily as needed for rhinitis.    CYCLOBENZAPRINE (FLEXERIL) 10 MG TABLET    Take 10 mg by mouth 2 (two) times daily.    DUTASTERIDE (AVODART) 0.5 MG CAPSULE    Take 0.5 mg by mouth daily.     ESOMEPRAZOLE (NEXIUM) 40 MG CAPSULE    TAKE 1 CAPSULE TWICE DAILY   FEXOFENADINE (ALLEGRA) 180 MG TABLET    Take 180 mg by mouth daily.     FUROSEMIDE (LASIX) 40 MG TABLET    Take 1 tablet (40 mg total) by mouth 2 (two) times daily as needed.   GABAPENTIN (NEURONTIN) 800 MG TABLET    Take 1 tablet (800 mg total) by mouth 3 (three) times daily.   GLUCOSE BLOOD (ONE TOUCH ULTRA TEST) TEST STRIP    1 each by Other route 2 (two) times daily. Use as instructed Dx- E11.8   HYDROCHLOROTHIAZIDE (HYDRODIURIL) 12.5 MG TABLET     Take 2 tablets (25 mg total) by mouth at bedtime.   INSULIN ASPART (NOVOLOG FLEXPEN) 100 UNIT/ML FLEXPEN    6 units three times daily-samples given at this time   LANTUS SOLOSTAR 100 UNIT/ML SOLOSTAR PEN    22 Units daily at 10 pm.    LIRAGLUTIDE (VICTOZA) 18 MG/3ML SOPN    Inject 0.3 mLs (1.8 mg total) into the skin daily.   MAGNESIUM OXIDE (MAG-OX) 400 (241.3 MG) MG TABLET    Take 1 tablet (400 mg total) by mouth 2 (two) times daily.   METFORMIN (GLUCOPHAGE) 1000 MG TABLET    TAKE 1 TABLET (1,000 MG TOTAL) BY MOUTH 2 (TWO) TIMES DAILY.   METOPROLOL (LOPRESSOR) 100 MG TABLET    Take 1 tablet (100 mg total) by mouth 2 (two) times daily.   MOMETASONE (ELOCON) 0.1 % CREAM    Apply 1 application topically daily. For 2 weeks   MULTIPLE VITAMIN (MULTIVITAMIN WITH MINERALS) TABS TABLET    Take 1 tablet by mouth daily.   NOVOLOG FLEXPEN 100 UNIT/ML  FLEXPEN    INJECT UP TO 10 UNITS 3 TIMES A DAY SUBQ   OMEGA-3 FATTY ACIDS (FISH OIL) 1200 MG CAPS    Take 1,200 mg by mouth 2 (two) times daily.   OXYCODONE (OXYCONTIN) 40 MG 12 HR TABLET    Take 1 tablet (40 mg total) by mouth every 8 (eight) hours as needed.   RANITIDINE (ZANTAC) 300 MG TABLET    Take 300 mg by mouth daily.   TESTOSTERONE CYPIONATE (DEPOTESTOSTERONE CYPIONATE) 200 MG/ML INJECTION    Inject 200 mg into the muscle every 21 ( twenty-one) days.     Review of Systems  Constitutional: Negative.   HENT: Negative.   Eyes: Negative.   Respiratory: Negative.   Cardiovascular: Negative.   Endocrine: Negative.   Musculoskeletal: Negative.   Neurological: Negative.   Hematological: Negative.   Psychiatric/Behavioral: Negative.     Social History  Substance Use Topics  . Smoking status: Former Smoker -- 1.00 packs/day for 25 years    Types: Cigarettes    Quit date: 12/26/1989  . Smokeless tobacco: Never Used  . Alcohol Use: No   Objective:   BP 130/78 mmHg  Temp(Src) 98.5 F (36.9 C)  Resp 16  Wt 229 lb (103.874 kg)  Physical  Exam  Constitutional: He is oriented to person, place, and time. He appears well-developed and well-nourished.  HENT:  Head: Normocephalic and atraumatic.  Right Ear: External ear normal.  Left Ear: External ear normal.  Nose: Nose normal.  Eyes: Conjunctivae are normal.  Neck: Neck supple. No thyromegaly present.  Cardiovascular: Normal rate, regular rhythm and normal heart sounds.   Pulmonary/Chest: Effort normal and breath sounds normal.  Abdominal: Soft.  Musculoskeletal: He exhibits edema.  +1 edema on bilateral lower extremities.   Lymphadenopathy:    He has no cervical adenopathy.  Neurological: He is alert and oriented to person, place, and time.  Skin: Skin is warm and dry.  Psychiatric: He has a normal mood and affect. His behavior is normal. Judgment and thought content normal.        Assessment & Plan:     1. Uncontrolled type 2 diabetes mellitus with complication, with long-term current use of insulin (HCC) - POCT glycosylated hemoglobin (Hb A1C)--6.3 today. 2. Chronic severe degenerative disc disease/osteoarthritis Discussed with patient possibly cutting back on his chronic narcotics. He is in complete agreement with this over time. 3. Obesity Patient is doing well with his diet and has lost over 20 pounds in the last few months 4. Obstructive sleep apnea Patient is wearing his BiPAP nightly and it feels much better since starting this. 5. Recurrent pneumonia I do think treating the sleep apnea will help as I think the patient is aspirating right due to sleep apnea and chronic narcotic-oversedation I have done the exam and reviewed the above chart and it is accurate to the best of my knowledge.       Richard Cranford Mon, MD  [facility]

## 2016-04-01 ENCOUNTER — Telehealth: Payer: Self-pay | Admitting: Family Medicine

## 2016-04-01 NOTE — Telephone Encounter (Signed)
Pt states he has a salt taste in his mouth.  Pt is asking what this could be from.  [id number]

## 2016-04-01 NOTE — Telephone Encounter (Signed)
As he been on any recent antibiotics.?If not I will get a CBC and met C. Nasal spray sometimes can have a taste for some people. Amiodarone can have all sorts of side effects, I'm not sure if that is one of them. Make sure he is using distilled water in his BiPAP, not well water, not city/tap water. Could possibly be the BiPAP

## 2016-04-01 NOTE — Telephone Encounter (Signed)
Please review-aa 

## 2016-04-02 ENCOUNTER — Other Ambulatory Visit: Payer: Self-pay

## 2016-04-02 MED ORDER — INSULIN ASPART 100 UNIT/ML FLEXPEN: PEN_INJECTOR | SUBCUTANEOUS | Status: DC

## 2016-04-02 NOTE — Telephone Encounter (Signed)
The patient states that he is using some allergy medicine and he thought it might be coming from that.  He is not bothered by the taste he was just questioning it.  He states he would like to wait until he comes off of that medicine to see if it gets better before having any labs done if this is ok with you? Thanks

## 2016-04-02 NOTE — Telephone Encounter (Signed)
Patient advised as directed below.  Thanks,  -Joseline 

## 2016-04-02 NOTE — Telephone Encounter (Signed)
agreed

## 2016-04-11 ENCOUNTER — Other Ambulatory Visit: Payer: Self-pay | Admitting: Family Medicine

## 2016-04-12 NOTE — Telephone Encounter (Signed)
Dr. Darnell Level He is requesting Jerry Parsons  25 mg, this is not on his medication list. ED

## 2016-04-14 DIAGNOSIS — E291 Testicular hypofunction: Secondary | ICD-10-CM | POA: Diagnosis not present

## 2016-04-26 ENCOUNTER — Telehealth: Payer: Self-pay

## 2016-04-26 MED ORDER — AMITRIPTYLINE HCL 25 MG PO TABS: 25.0000 mg | ORAL_TABLET | Freq: Every day | ORAL | Status: DC

## 2016-04-26 NOTE — Telephone Encounter (Signed)
Ok to do with 3 rf.

## 2016-04-26 NOTE — Telephone Encounter (Signed)
Fax from pharmacy requesting refill on Amitriptyline for 90 day supply-aa for [facility] [address]-aa

## 2016-04-26 NOTE — Telephone Encounter (Signed)
Done-aa 

## 2016-05-20 DIAGNOSIS — E291 Testicular hypofunction: Secondary | ICD-10-CM | POA: Diagnosis not present

## 2016-06-01 ENCOUNTER — Other Ambulatory Visit: Payer: Self-pay | Admitting: Family Medicine

## 2016-06-07 ENCOUNTER — Telehealth: Payer: Self-pay | Admitting: Family Medicine

## 2016-06-07 ENCOUNTER — Other Ambulatory Visit: Payer: Self-pay

## 2016-06-07 DIAGNOSIS — G8929 Other chronic pain: Secondary | ICD-10-CM

## 2016-06-07 DIAGNOSIS — M549 Dorsalgia, unspecified: Principal | ICD-10-CM

## 2016-06-07 MED ORDER — OXYCODONE HCL ER 40 MG PO T12A: 40.0000 mg | EXTENDED_RELEASE_TABLET | Freq: Three times a day (TID) | ORAL | Status: DC | PRN

## 2016-06-07 MED ORDER — OXYCODONE HCL ER 40 MG PO T12A: 40.0000 mg | EXTENDED_RELEASE_TABLET | Freq: Two times a day (BID) | ORAL | Status: DC

## 2016-06-07 NOTE — Telephone Encounter (Signed)
Printed, waiting on signature to call patient to pick up.  ED

## 2016-06-07 NOTE — Telephone Encounter (Signed)
Ok to [employer]

## 2016-06-07 NOTE — Telephone Encounter (Signed)
Pt needs refill of medication pt's wife stated they normally get a three month supply and they will mail into the pharmacy.  Please contact her when ready for pick up.

## 2016-06-07 NOTE — Telephone Encounter (Signed)
Please review-aa 

## 2016-06-08 DIAGNOSIS — M4806 Spinal stenosis, lumbar region: Secondary | ICD-10-CM | POA: Diagnosis not present

## 2016-06-08 DIAGNOSIS — M47816 Spondylosis without myelopathy or radiculopathy, lumbar region: Secondary | ICD-10-CM | POA: Diagnosis not present

## 2016-06-08 NOTE — Telephone Encounter (Signed)
Pt advised RX placed up front-aa

## 2016-06-10 ENCOUNTER — Other Ambulatory Visit: Payer: Self-pay

## 2016-06-10 DIAGNOSIS — M549 Dorsalgia, unspecified: Principal | ICD-10-CM

## 2016-06-10 DIAGNOSIS — G8929 Other chronic pain: Secondary | ICD-10-CM

## 2016-06-10 MED ORDER — OXYCODONE HCL ER 40 MG PO T12A: 40.0000 mg | EXTENDED_RELEASE_TABLET | Freq: Three times a day (TID) | ORAL | Status: DC | PRN

## 2016-06-11 ENCOUNTER — Other Ambulatory Visit: Payer: Self-pay

## 2016-06-11 DIAGNOSIS — G8929 Other chronic pain: Secondary | ICD-10-CM

## 2016-06-11 DIAGNOSIS — M549 Dorsalgia, unspecified: Principal | ICD-10-CM

## 2016-06-11 MED ORDER — OXYCODONE HCL ER 40 MG PO T12A: 40.0000 mg | EXTENDED_RELEASE_TABLET | Freq: Three times a day (TID) | ORAL | Status: DC | PRN

## 2016-06-16 DIAGNOSIS — E291 Testicular hypofunction: Secondary | ICD-10-CM | POA: Diagnosis not present

## 2016-06-17 DIAGNOSIS — H2511 Age-related nuclear cataract, right eye: Secondary | ICD-10-CM | POA: Diagnosis not present

## 2016-06-18 ENCOUNTER — Encounter: Payer: Self-pay | Admitting: Family Medicine

## 2016-06-29 ENCOUNTER — Other Ambulatory Visit: Payer: Self-pay | Admitting: Cardiovascular Disease

## 2016-07-07 ENCOUNTER — Ambulatory Visit: Payer: Medicare Other | Admitting: Family Medicine

## 2016-07-12 ENCOUNTER — Ambulatory Visit (INDEPENDENT_AMBULATORY_CARE_PROVIDER_SITE_OTHER): Payer: Medicare Other | Admitting: Family Medicine

## 2016-07-12 ENCOUNTER — Encounter: Payer: Self-pay | Admitting: Family Medicine

## 2016-07-12 VITALS — BP 120/56 | HR 72 | Temp 98.3°F | Resp 16 | Wt 251.0 lb

## 2016-07-12 DIAGNOSIS — E1165 Type 2 diabetes mellitus with hyperglycemia: Secondary | ICD-10-CM | POA: Diagnosis not present

## 2016-07-12 DIAGNOSIS — G4733 Obstructive sleep apnea (adult) (pediatric): Secondary | ICD-10-CM | POA: Diagnosis not present

## 2016-07-12 DIAGNOSIS — M549 Dorsalgia, unspecified: Secondary | ICD-10-CM

## 2016-07-12 DIAGNOSIS — Z794 Long term (current) use of insulin: Secondary | ICD-10-CM

## 2016-07-12 DIAGNOSIS — G629 Polyneuropathy, unspecified: Secondary | ICD-10-CM | POA: Diagnosis not present

## 2016-07-12 DIAGNOSIS — G8929 Other chronic pain: Secondary | ICD-10-CM | POA: Diagnosis not present

## 2016-07-12 DIAGNOSIS — I10 Essential (primary) hypertension: Secondary | ICD-10-CM | POA: Diagnosis not present

## 2016-07-12 DIAGNOSIS — IMO0002 Reserved for concepts with insufficient information to code with codable children: Secondary | ICD-10-CM

## 2016-07-12 DIAGNOSIS — E118 Type 2 diabetes mellitus with unspecified complications: Secondary | ICD-10-CM | POA: Diagnosis not present

## 2016-07-12 LAB — POCT GLYCOSYLATED HEMOGLOBIN (HGB A1C): Hemoglobin A1C: 7.8

## 2016-07-12 MED ORDER — PREGABALIN 50 MG PO CAPS: 50.0000 mg | ORAL_CAPSULE | Freq: Three times a day (TID) | ORAL | Status: DC

## 2016-07-12 MED ORDER — OXYCODONE HCL ER 40 MG PO T12A: 40.0000 mg | EXTENDED_RELEASE_TABLET | Freq: Three times a day (TID) | ORAL | Status: DC | PRN

## 2016-07-12 NOTE — Progress Notes (Signed)
Patient ID: Jerry Parsons, male   DOB: [birth year], 65 y.o.   MRN: [id number]    Subjective:  HPI  Diabetes Mellitus Type II, Follow-up:   Lab Results  Component Value Date   HGBA1C 6.3 03/30/2016   HGBA1C 9.1 12/10/2015   HGBA1C 7.9* 09/09/2015    Last seen for diabetes 3 months ago.  Management since then includes none. He reports good compliance with treatment. He is not having side effects.  Current symptoms include none and have been unchanged. Home blood sugar records: 130-180's  Episodes of hypoglycemia? no   Current Insulin Regimen: Lantus 22 units Novolog 6 units twice daily and Victoza 18 units every morning Most Recent Eye Exam: 3 weeks ago.  Pt was 229 at [facility] on 03/30/16 and today 251.  Pertinent Labs:    Component Value Date/Time   CHOL 89* 12/11/2015 1014   CHOL 192 09/09/2015 0501   CHOL 93 04/03/2013 0209   TRIG 298* 12/11/2015 1014   TRIG 163 04/03/2013 0209   HDL 26* 12/11/2015 1014   HDL 29* 09/09/2015 0501   HDL 25* 04/03/2013 0209   LDLCALC 3 12/11/2015 1014   LDLCALC UNABLE TO CALCULATE IF TRIGLYCERIDE OVER 400 mg/dL 09/09/2015 0501   LDLCALC 35 04/03/2013 0209   CREATININE 1.18 12/11/2015 1014   CREATININE 1.4* 02/11/2015   CREATININE 0.98 04/12/2013 1150    Wt Readings from Last 3 Encounters:  07/12/16 251 lb (113.853 kg)  03/30/16 229 lb (103.874 kg)  03/19/16 232 lb (105.235 kg)    ------------------------------------------------------------------------ Chronic radiculopathy continues. Pain is severe. Ongoing and worsening right knee pain is keeping patient awake at night. May need to change OxyContin to some other narcotic. Risk and benefits of chronic narcotic use have been discussed at length with this patient.    Prior to Admission medications   Medication Sig Start Date End Date Taking? Authorizing Provider  amiodarone (PACERONE) 200 MG tablet TAKE 1 TABLET (200 MG TOTAL) BY MOUTH 2 (TWO) TIMES DAILY. 06/30/16  Yes Minna Merritts, MD  amitriptyline (ELAVIL) 25 MG tablet Take 1 tablet (25 mg total) by mouth at bedtime. 04/26/16  Yes Natesha Hassey Maceo Pro., MD  aspirin EC 81 MG tablet Take 81 mg by mouth at bedtime.   Yes Historical Provider, MD  atorvastatin (LIPITOR) 40 MG tablet Take 1 tablet (40 mg total) by mouth daily at 6 PM. 09/10/15  Yes Ezelle Surprenant Maceo Pro., MD  budesonide (RHINOCORT AQUA) 32 MCG/ACT nasal spray Place 1 spray into the nose daily as needed for rhinitis.    Yes Historical Provider, MD  cyclobenzaprine (FLEXERIL) 10 MG tablet TAKE 1 TABLET BY MOUTH 3 TIMES A DAY 06/01/16  Yes Jarious Lyon Maceo Pro., MD  dutasteride (AVODART) 0.5 MG capsule Take 0.5 mg by mouth daily.     Yes Historical Provider, MD  esomeprazole (NEXIUM) 40 MG capsule TAKE 1 CAPSULE TWICE DAILY 03/17/16  Yes Treshun Wold Maceo Pro., MD  fexofenadine (ALLEGRA) 180 MG tablet Take 180 mg by mouth daily.     Yes Historical Provider, MD  furosemide (LASIX) 40 MG tablet Take 1 tablet (40 mg total) by mouth 2 (two) times daily as needed. 03/19/16  Yes Minna Merritts, MD  gabapentin (NEURONTIN) 800 MG tablet Take 1 tablet (800 mg total) by mouth 3 (three) times daily. Patient taking differently: Take 800 mg by mouth 4 (four) times daily.  12/23/15  Yes Oluwadamilola Rosamond Maceo Pro., MD  glucose blood (ONE TOUCH ULTRA  TEST) test strip 1 each by Other route 2 (two) times daily. Use as instructed Dx- E11.8 12/23/15  Yes Cristobal Advani Maceo Pro., MD  hydrochlorothiazide (HYDRODIURIL) 12.5 MG tablet Take 2 tablets (25 mg total) by mouth at bedtime. Patient taking differently: Take 12.5 mg by mouth at bedtime.  02/09/16  Yes Sonnet Rizor Maceo Pro., MD  insulin aspart (NOVOLOG FLEXPEN) 100 UNIT/ML FlexPen 6 units three times daily-samples given at this time 04/02/16  Yes Denaya Horn Maceo Pro., MD  LANTUS SOLOSTAR 100 UNIT/ML Solostar Pen 22 Units daily at 10 pm.  02/09/16  Yes Historical Provider, MD  Liraglutide (VICTOZA) 18 MG/3ML SOPN Inject 0.3 mLs (1.8 mg total)  into the skin daily. 02/09/16  Yes Jadon Ressler Maceo Pro., MD  magnesium oxide (MAG-OX) 400 (241.3 MG) MG tablet Take 1 tablet (400 mg total) by mouth 2 (two) times daily. Patient taking differently: Take 800 mg by mouth 2 (two) times daily.  11/15/15  Yes Luddie Boghosian Maceo Pro., MD  metFORMIN (GLUCOPHAGE) 1000 MG tablet TAKE 1 TABLET (1,000 MG TOTAL) BY MOUTH 2 (TWO) TIMES DAILY. 11/24/15  Yes Historical Provider, MD  metoprolol (LOPRESSOR) 100 MG tablet Take 1 tablet (100 mg total) by mouth 2 (two) times daily. 02/09/16  Yes [place] Agostinelli Maceo Pro., MD  mometasone (ELOCON) 0.1 % cream Apply 1 application topically daily. For 2 weeks 03/02/16  Yes Braxtyn Bojarski Maceo Pro., MD  Multiple Vitamin (MULTIVITAMIN WITH MINERALS) TABS tablet Take 1 tablet by mouth daily.   Yes Historical Provider, MD  NOVOLOG FLEXPEN 100 UNIT/ML FlexPen INJECT UP TO 10 UNITS 3 TIMES A DAY SUBQ Patient taking differently: INJECT UP TO 6 UNITS 3 TIMES A DAY SUBQ 10/22/15  Yes Chaz Ronning Maceo Pro., MD  Omega-3 Fatty Acids (FISH OIL) 1200 MG CAPS Take 1,200 mg by mouth 2 (two) times daily.   Yes Historical Provider, MD  oxyCODONE (OXYCONTIN) 40 mg 12 hr tablet Take 1 tablet (40 mg total) by mouth every 8 (eight) hours as needed. 06/10/16  Yes Dorr Perrot Maceo Pro., MD  oxyCODONE (OXYCONTIN) 40 mg 12 hr tablet Take 1 tablet (40 mg total) by mouth every 8 (eight) hours as needed. 06/10/16  Yes Dianah Pruett Maceo Pro., MD  oxyCODONE (OXYCONTIN) 40 mg 12 hr tablet Take 1 tablet (40 mg total) by mouth every 8 (eight) hours as needed. 06/11/16  Yes Birdie Sons, MD  ranitidine (ZANTAC) 300 MG tablet Take 300 mg by mouth daily.   Yes Historical Provider, MD  testosterone cypionate (DEPOTESTOSTERONE CYPIONATE) 200 MG/ML injection Inject 200 mg into the muscle every 21 ( twenty-one) days.    Yes Historical Provider, MD    Patient Active Problem List   Diagnosis Date Noted  . Injury of kidney 01/21/2016  . Amblyopia 12/30/2015  . Cornea scar  12/30/2015  . NS (nuclear sclerosis) 12/30/2015  . Pseudoaphakia 12/30/2015  . CVA (cerebral infarction) 09/08/2015  . Dehydration 09/08/2015  . Aspiration pneumonia ([place]) 07/04/2015  . Paroxysmal atrial fibrillation ([place]) 07/04/2015  . Arthritis of knee, degenerative 05/28/2015  . Chronic back pain 09/17/2014  . Leg edema 05/07/2014  . Chronic diastolic CHF (congestive heart failure) ([place]) 05/07/2014  . Campylobacter diarrhea 04/25/2013  . Atrial flutter ([place]) 01/23/2013  . Obesity 05/19/2012  . Hyperlipidemia 11/11/2011  . Diastolic dysfunction 0000000  . SOB (shortness of breath) 04/12/2011  . Diabetes mellitus type 2, uncontrolled, with complications ([place]) [id number]  . HYPERTENSION, BENIGN 03/15/2011  . DVT 03/15/2011  .  TACHYCARDIA 03/15/2011    Past Medical History  Diagnosis Date  . Hypertension   . DVT (deep venous thrombosis) ([place]) 02/2010  . Diabetes mellitus   . Pulmonary embolus ([place]) 2011  . Kidney failure   . Neuropathy ([place])   . GERD (gastroesophageal reflux disease)   . Depression   . BPH (benign prostatic hyperplasia)   . 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     x2  . TIA (transient ischemic attack)   . Seasonal allergies   . Arrhythmia     tachycardia, A-Fib  . Hypercholesteremia   . Seizures ([place])     as child   . Wears dentures     full upper and lower  . Sleep apnea     severe, no machine  . Blind right eye   . Stiff neck     limited turning s/p titanium plate placement    Social History   Social History  . Marital Status: Married    Spouse Name: N/A  . Number of Children: N/A  . Years of Education: N/A   Occupational History  . Not on file.   Social History Main Topics  . Smoking status: Former Smoker -- 1.00 packs/day for 25 years    Types: Cigarettes    Quit date: 12/26/1989  . Smokeless tobacco: Never Used  . Alcohol Use: No  . Drug Use: No  . Sexual Activity: Not on file   Other Topics Concern  . Not on file     Social History Narrative    Allergies  Allergen Reactions  . Carisoprodol Other (See Comments)    Reaction:  Unknown     Review of Systems  Constitutional: Negative.   HENT: Negative.   Eyes: Negative.   Respiratory: Negative.   Cardiovascular: Negative.   Gastrointestinal: Negative.   Genitourinary: Negative.   Musculoskeletal: Positive for back pain and joint pain.  Skin: Negative.   Neurological: Negative.   Endo/Heme/Allergies: Negative.   Psychiatric/Behavioral: Negative.     Immunization History  Administered Date(s) Administered  . Influenza, High Dose Seasonal PF 09/18/2015  . Influenza-Unspecified 08/27/2014   Objective:  BP 120/56 mmHg  Pulse 72  Temp(Src) 98.3 F (36.8 C) (Oral)  Resp 16  Wt 251 lb (113.853 kg)  Physical Exam  Constitutional: He is oriented to person, place, and time and well-developed, well-nourished, and in no distress.  HENT:  Head: Normocephalic and atraumatic.  Right Ear: External ear normal.  Left Ear: External ear normal.  Nose: Nose normal.  Eyes: Conjunctivae and EOM are normal. Pupils are equal, round, and reactive to light.  Neck: Normal range of motion. Neck supple.  Cardiovascular: Normal rate, regular rhythm, normal heart sounds and intact distal pulses.   Pulmonary/Chest: Effort normal and breath sounds normal.  Abdominal: Soft.  Musculoskeletal: Normal range of motion.  Neurological: He is alert and oriented to person, place, and time. He has normal reflexes. Gait normal. GCS score is 15.  Skin: Skin is warm and dry.  Psychiatric: Mood, memory, affect and judgment normal.    Lab Results  Component Value Date   WBC 5.9 09/08/2015   HGB 12.8* 09/08/2015   HCT 37.3* 09/08/2015   PLT 127* 09/08/2015   GLUCOSE 260* 12/11/2015   CHOL 89* 12/11/2015   TRIG 298* 12/11/2015   HDL 26* 12/11/2015   LDLCALC 3 12/11/2015   TSH 2.21 04/03/2013   INR 1.2 04/02/2013   HGBA1C 6.3 03/30/2016    CMP  Component  Value Date/Time   NA 142 12/11/2015 1014   NA 135 09/08/2015 1754   NA 137 04/12/2013 1150   K 4.4 12/11/2015 1014   K 4.0 04/12/2013 1150   CL 95* 12/11/2015 1014   CL 104 04/12/2013 1150   CO2 32* 12/11/2015 1014   CO2 30 04/12/2013 1150   GLUCOSE 260* 12/11/2015 1014   GLUCOSE 299* 09/08/2015 1754   GLUCOSE 146* 04/12/2013 1150   BUN 24 12/11/2015 1014   BUN 16 09/08/2015 1754   BUN 11 04/12/2013 1150   CREATININE 1.18 12/11/2015 1014   CREATININE 1.4* 02/11/2015   CREATININE 0.98 04/12/2013 1150   CALCIUM 10.3* 12/11/2015 1014   CALCIUM 8.8 04/12/2013 1150   PROT 6.5 12/11/2015 1014   PROT 6.7 09/08/2015 1754   PROT 6.9 04/12/2013 1150   ALBUMIN 4.3 12/11/2015 1014   ALBUMIN 3.8 09/08/2015 1754   ALBUMIN 3.3* 04/12/2013 1150   AST 22 12/11/2015 1014   AST 22 04/12/2013 1150   ALT 36 12/11/2015 1014   ALT 29 04/12/2013 1150   ALKPHOS 116 12/11/2015 1014   ALKPHOS 99 04/12/2013 1150   BILITOT 0.5 12/11/2015 1014   BILITOT 0.6 09/08/2015 1754   BILITOT 0.5 04/12/2013 1150   GFRNONAA 64 12/11/2015 1014   GFRNONAA >60 04/12/2013 1150   GFRAA 74 12/11/2015 1014   GFRAA >60 04/12/2013 1150    Assessment and Plan :  1. Uncontrolled type 2 diabetes mellitus with complication, with long-term current use of insulin (HCC) Diet and exercise.  - POCT HgB A1C- 7.8  2. HYPERTENSION, BENIGN   3. Chronic back pain Refills x3 - oxyCODONE (OXYCONTIN) 40 mg 12 hr tablet; Take 1 tablet (40 mg total) by mouth every 8 (eight) hours as needed.  Dispense: 270 tablet; Refill: 0 Consider changing to a different narcotic. 4. OSA (obstructive sleep apnea) Doing well on C pap machine.   5. Neuropathy (HCC)  - pregabalin (LYRICA) 50 MG capsule; Take 1 capsule (50 mg total) by mouth 3 (three) times daily.  Dispense: 90 capsule; Refill: 5 6. End-stage knee osteoarthritis 7. Obesity Weight loss with dietary measures discussed at length. Patient was seen and examined by Dr. Miguel Aschoff, and noted scribed by Webb Laws, [place] MD [facility] 07/12/2016 3:18 PM

## 2016-07-15 DIAGNOSIS — E291 Testicular hypofunction: Secondary | ICD-10-CM | POA: Diagnosis not present

## 2016-07-15 DIAGNOSIS — N5201 Erectile dysfunction due to arterial insufficiency: Secondary | ICD-10-CM | POA: Diagnosis not present

## 2016-07-28 ENCOUNTER — Other Ambulatory Visit: Payer: Self-pay | Admitting: Family Medicine

## 2016-08-04 ENCOUNTER — Other Ambulatory Visit: Payer: Self-pay | Admitting: Family Medicine

## 2016-08-04 MED ORDER — DUTASTERIDE 0.5 MG PO CAPS: 0.5000 mg | ORAL_CAPSULE | Freq: Every day | ORAL | 3 refills | Status: DC

## 2016-08-04 NOTE — Telephone Encounter (Signed)
Ok--3 rf. 

## 2016-08-04 NOTE — Telephone Encounter (Signed)
Ok to fill? Please advise. Thanks!  

## 2016-08-04 NOTE — Telephone Encounter (Signed)
Pt is requesting a 90 day supply/MW

## 2016-08-04 NOTE — Telephone Encounter (Signed)
Sent medication into mail order pharmacy.

## 2016-08-04 NOTE — Telephone Encounter (Signed)
Pt contacted office for refill request on the following medications:  dutasteride (AVODART) 0.5 MG capsule.  [facility] mail order.  [phone]

## 2016-08-17 DIAGNOSIS — E291 Testicular hypofunction: Secondary | ICD-10-CM | POA: Diagnosis not present

## 2016-08-17 DIAGNOSIS — N5201 Erectile dysfunction due to arterial insufficiency: Secondary | ICD-10-CM | POA: Diagnosis not present

## 2016-08-18 ENCOUNTER — Other Ambulatory Visit: Payer: Self-pay | Admitting: Family Medicine

## 2016-08-18 DIAGNOSIS — G629 Polyneuropathy, unspecified: Secondary | ICD-10-CM

## 2016-08-18 MED ORDER — PREGABALIN 50 MG PO CAPS: 50.0000 mg | ORAL_CAPSULE | Freq: Three times a day (TID) | ORAL | 1 refills | Status: DC

## 2016-08-18 NOTE — Telephone Encounter (Signed)
3 rf 

## 2016-08-18 NOTE — Telephone Encounter (Signed)
Pt contacted office for refill request on the following medications: pregabalin (LYRICA) 50 MG capsule.  90 day supply. [facility] mail order.  [id number]

## 2016-08-18 NOTE — Telephone Encounter (Signed)
Done-aa 

## 2016-09-07 DIAGNOSIS — M4806 Spinal stenosis, lumbar region: Secondary | ICD-10-CM | POA: Diagnosis not present

## 2016-09-07 DIAGNOSIS — M47816 Spondylosis without myelopathy or radiculopathy, lumbar region: Secondary | ICD-10-CM | POA: Diagnosis not present

## 2016-09-14 ENCOUNTER — Encounter: Payer: Self-pay | Admitting: Cardiovascular Disease

## 2016-09-14 ENCOUNTER — Ambulatory Visit (INDEPENDENT_AMBULATORY_CARE_PROVIDER_SITE_OTHER): Payer: Medicare Other | Admitting: Cardiovascular Disease

## 2016-09-14 VITALS — BP 138/62 | HR 69 | Ht 71.0 in | Wt 252.8 lb

## 2016-09-14 DIAGNOSIS — M170 Bilateral primary osteoarthritis of knee: Secondary | ICD-10-CM

## 2016-09-14 DIAGNOSIS — E669 Obesity, unspecified: Secondary | ICD-10-CM

## 2016-09-14 DIAGNOSIS — E785 Hyperlipidemia, unspecified: Secondary | ICD-10-CM | POA: Diagnosis not present

## 2016-09-14 DIAGNOSIS — I10 Essential (primary) hypertension: Secondary | ICD-10-CM

## 2016-09-14 DIAGNOSIS — I4892 Unspecified atrial flutter: Secondary | ICD-10-CM | POA: Diagnosis not present

## 2016-09-14 DIAGNOSIS — IMO0002 Reserved for concepts with insufficient information to code with codable children: Secondary | ICD-10-CM

## 2016-09-14 DIAGNOSIS — I480 Paroxysmal atrial fibrillation: Secondary | ICD-10-CM

## 2016-09-14 DIAGNOSIS — I5032 Chronic diastolic (congestive) heart failure: Secondary | ICD-10-CM

## 2016-09-14 DIAGNOSIS — Z01810 Encounter for preprocedural cardiovascular examination: Secondary | ICD-10-CM

## 2016-09-14 DIAGNOSIS — Z794 Long term (current) use of insulin: Secondary | ICD-10-CM

## 2016-09-14 DIAGNOSIS — E1165 Type 2 diabetes mellitus with hyperglycemia: Secondary | ICD-10-CM

## 2016-09-14 DIAGNOSIS — E118 Type 2 diabetes mellitus with unspecified complications: Secondary | ICD-10-CM

## 2016-09-14 NOTE — Progress Notes (Signed)
Cardiology Office Note  Date:  09/14/2016   ID:  Jerry Parsons, DOB 12/10/1951, MRN [id number]  PCP:  [name], MD   Chief Complaint  Patient presents with  . other    6 month follow up. Meds reviewed by the pt. verbally.     HPI:  Jerry Parsons is a very pleasant 65 year old gentleman, patient of Dr. [name], with a history of multiple back surgeries, spinal stenosis , obesity, poorly controlled diabetes , DVT, PE in March 2011 previously on warfarin, 30 year smoking history,  hypertension, depression, and history of renal disease, s/p arthroscopic knee surgery,   presenting for routine followup of his hypertension and cardiac risk factors. . History of obstructive sleep apnea,  tolerating CPAP, chronic sweating at nighttime which he attributes to the Vicodin.  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 Not on anticoagulation  In follow-up today, his weight has trended up significantly No regular exercise, limited by his chronic back pain, SI joint Reports having had numerous back surgeries Poor diet, nose he needs to change Denies any significant shortness of breath, chest pain concerning for angina  He is interested in having total knee replacements Unable to ambulate very far secondary to his knee pain  EKG on today's visit shows normal sinus rhythm with rate 69 bpm, no significant ST or T-wave changes  Other past medical history [place] He was in ICU for 3 days, long recovery Lost more than 30 pounds,  Lipid panel done while he was very ill early 2017, total cholesterol at that time 89  Denies any atrial fibrillation through his hospital course, reports he was changed to amiodarone IV infusion and then back to pill when he was tolerating oral medications  Previous event where he had buckling of his knee, fall, contusion, TIA-type symptoms that took him to the  hospital. He was kept overnight and most of his workup was relatively unrevealing including MRI/MRA, carotid ultrasound, echocardiogram.  History of atrial flutter, status post cardioversion on 03/14/2013.   2 admissions to the hospital for gastroenteritis/ campylobacter infection. Required a long course of antibiotics. Possible  infection from tainted chicken.  Had back surgery, severe pain, went into atrial fib/flutter,  09/2014, had cardioversion   Previously on prednisone for his "legs". Sugars ran high. Previously developed leg swelling. Wife went away and he sat in a recliner with his feet down for close to 2 weeks watching TV, drinking fluids.  Creatinine at the end of April was 1.5, BUN 27, possibly from overdiuresis  cardiac workup in the 1990s:  cardiac catheterization with "minimal blockage". Previous DVT , he did have shortness of breath.  echocardiogram March 2011 shows normal LV and RV systolic function, mild LVH, diastolic dysfunction, mildly dilated left atrium.   PMH:   has a past medical history of Arrhythmia; Blind right eye; BPH (benign prostatic hyperplasia); Depression; Diabetes mellitus; DVT (deep venous thrombosis) ([place]) (02/2010); 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; GERD (gastroesophageal reflux disease); Hypercholesteremia; Hypertension; Kidney failure; Neuropathy ([place]); Pulmonary embolus ([place]) (2011); Seasonal allergies; Seizures ([place]); Sleep apnea; Stiff neck; TIA (transient ischemic attack); and Wears dentures.  PSH:    Past Surgical History:  Procedure Laterality Date  . BACK SURGERY     x 8; upper x 3 & lower x 5  . CARDIOVERSION  03/14/13, 10/16   2014 - [place], 2016 - [place]  . CATARACT EXTRACTION W/PHACO  Left 10/29/2015   Procedure: CATARACT EXTRACTION PHACO AND INTRAOCULAR LENS PLACEMENT (IOC);  Surgeon: Leandrew Koyanagi, MD;  Location: [facility];  Service: Ophthalmology;  Laterality: Left;  DIABETIC - insulin and oral meds Sleep apnea  - no machine  . GALLBLADDER SURGERY  2002  . KNEE ARTHROSCOPY  2012, 2016   right - 2012, left 2016  . ROTATOR CUFF REPAIR  2001    Current Outpatient Prescriptions  Medication Sig Dispense Refill  . amiodarone (PACERONE) 200 MG tablet Take 200 mg by mouth daily.    [name] amitriptyline (ELAVIL) 25 MG tablet Take 1 tablet (25 mg total) by mouth at bedtime. 90 tablet 3  . aspirin EC 81 MG tablet Take 81 mg by mouth at bedtime.    [name] atorvastatin (LIPITOR) 40 MG tablet Take 1 tablet (40 mg total) by mouth daily at 6 PM. 90 tablet 3  . budesonide (RHINOCORT AQUA) 32 MCG/ACT nasal spray Place 1 spray into the nose daily as needed for rhinitis.     . cyclobenzaprine (FLEXERIL) 10 MG tablet TAKE 1 TABLET BY MOUTH 3 TIMES A DAY 90 tablet 12  . dutasteride (AVODART) 0.5 MG capsule Take 1 capsule (0.5 mg total) by mouth daily. 90 capsule 3  . esomeprazole (NEXIUM) 40 MG capsule TAKE 1 CAPSULE TWICE DAILY 90 capsule 3  . fexofenadine (ALLEGRA) 180 MG tablet Take 180 mg by mouth daily.      . furosemide (LASIX) 40 MG tablet Take 1 tablet (40 mg total) by mouth 2 (two) times daily as needed. 180 tablet 3  . gabapentin (NEURONTIN) 800 MG tablet Take 1 tablet (800 mg total) by mouth 3 (three) times daily. 270 tablet 3  . glucose blood (ONE TOUCH ULTRA TEST) test strip 1 each by Other route 2 (two) times daily. Use as instructed Dx- E11.8 300 each 3  . hydrochlorothiazide (HYDRODIURIL) 12.5 MG tablet Take 2 tablets (25 mg total) by mouth at bedtime. (Patient taking differently: Take 12.5 mg by mouth at bedtime. ) 30 tablet 12  . insulin aspart (NOVOLOG FLEXPEN) 100 UNIT/ML FlexPen 6 units three times daily-samples given at this time 15 mL 3  . LANTUS SOLOSTAR 100 UNIT/ML Solostar Pen 22 Units daily at 10 pm.     . Liraglutide (VICTOZA) 18 MG/3ML SOPN Inject 0.3 mLs (1.8 mg total) into the skin daily. 6 mL 3  . magnesium oxide (MAG-OX) 400 (241.3 MG) MG tablet Take 1 tablet (400 mg total) by mouth 2 (two) times  daily. (Patient taking differently: Take 800 mg by mouth 2 (two) times daily. ) 180 tablet 3  . metFORMIN (GLUCOPHAGE) 1000 MG tablet TAKE 1 TABLET (1,000 MG TOTAL) BY MOUTH 2 (TWO) TIMES DAILY.    . metoprolol (LOPRESSOR) 100 MG tablet Take 1 tablet (100 mg total) by mouth 2 (two) times daily. 60 tablet 12  . mometasone (ELOCON) 0.1 % cream Apply 1 application topically daily. For 2 weeks 45 g 0  . Multiple Vitamin (MULTIVITAMIN WITH MINERALS) TABS tablet Take 1 tablet by mouth daily.    [name] NOVOLOG FLEXPEN 100 UNIT/ML FlexPen INJECT UP TO 10 UNITS 3 TIMES A DAY SUBQ (Patient taking differently: INJECT UP TO 6 UNITS 3 TIMES A DAY SUBQ) 3 pen 12  . Omega-3 Fatty Acids (FISH OIL) 1200 MG CAPS Take 1,200 mg by mouth 2 (two) times daily.    [name] oxyCODONE (OXYCONTIN) 40 mg 12 hr tablet Take 1 tablet (40 mg total) by mouth every 8 (eight) hours  as needed. 270 tablet 0  . pregabalin (LYRICA) 50 MG capsule Take 1 capsule (50 mg total) by mouth 3 (three) times daily. 270 capsule 1  . ranitidine (ZANTAC) 300 MG tablet Take 300 mg by mouth daily.    [name] testosterone cypionate (DEPOTESTOSTERONE CYPIONATE) 200 MG/ML injection Inject 200 mg into the muscle every 21 ( twenty-one) days.      No current facility-administered medications for this visit.      Allergies:   Carisoprodol   Social History:  The patient  reports that he quit smoking about 26 years ago. His smoking use included Cigarettes. He has a 25.00 pack-year smoking history. He has never used smokeless tobacco. He reports that he does not drink alcohol or use drugs.   Family History:   family history includes Heart attack in his brother.    Review of Systems: Review of Systems  Constitutional: Negative.   Respiratory: Negative.   Cardiovascular: Negative.   Gastrointestinal: Negative.   Musculoskeletal: Positive for back pain and joint pain.  Neurological: Negative.   Psychiatric/Behavioral: Negative.   All other systems reviewed and are  negative.    PHYSICAL EXAM: VS:  BP 138/62 (BP Location: Left Arm, Patient Position: Sitting, Cuff Size: Normal)   Pulse 69   Ht 5\' 11"  (1.803 m)   Wt 252 lb 12 oz (114.6 kg)   BMI 35.25 kg/m  , BMI Body mass index is 35.25 kg/m. GEN: Well nourished, well developed, in no acute distress, obese  HEENT: normal  Neck: no JVD, carotid bruits, or masses Cardiac: RRR; no murmurs, rubs, or gallops,no edema  Respiratory:  clear to auscultation bilaterally, normal work of breathing GI: soft, nontender, nondistended, + BS MS: no deformity or atrophy  Skin: warm and dry, no rash Neuro:  Strength and sensation are intact Psych: euthymic mood, full affect    Recent Labs: 12/11/2015: ALT 36; BUN 24; Creatinine, Ser 1.18; Potassium 4.4; Sodium 142    Lipid Panel Lab Results  Component Value Date   CHOL 89 (L) 12/11/2015   HDL 26 (L) 12/11/2015   LDLCALC 3 12/11/2015   TRIG 298 (H) 12/11/2015      Wt Readings from Last 3 Encounters:  09/14/16 252 lb 12 oz (114.6 kg)  07/12/16 251 lb (113.9 kg)  03/30/16 229 lb (103.9 kg)       ASSESSMENT AND PLAN:  Atrial flutter, unspecified type ([place]) - Plan: EKG 12-Lead No recent episodes of arrhythmia Reports he is typically very symptomatic, does not want anticoagulation, prefers to stay on low-dose amiodarone. We will help arrange for amiodarone surveillance labs, including TSH  HYPERTENSION, BENIGN - Plan: EKG 12-Lead Blood pressure is well controlled on today's visit. No changes made to the medications.  Paroxysmal atrial fibrillation (HCC) - Plan: EKG 12-Lead No recent arrhythmia  Hyperlipidemia Cholesterol is at goal on the current lipid regimen. No changes to the medications were made. Caution him about further weight gain  Chronic diastolic CHF (congestive heart failure) ([place]) He is taking Lasix 40 mg twice a day, reports fluid is stable BMP through primary care  Uncontrolled type 2 diabetes mellitus with complication,  with long-term current use of insulin (HCC)We have encouraged continued exercise, careful diet management in an effort to lose weight.. Discussed various diet changes, need for regular exercise  Obesity As above, needs diet changes, exercise, weight trending in the wrong direction  Primary osteoarthritis of both knees Reports he needs to have total knee replacements. He is going to  call orthopedics, would like clearance. He is limiting his activities  Preop cardiovascular exam Acceptable risk for total knee replacement surgery, No further testing needed. No medications to hold   Total encounter time more than 25 minutes  Greater than 50% was spent in counseling and coordination of care with the patient   Disposition:   F/U  6 months   Orders Placed This Encounter  Procedures  . EKG 12-Lead     Signed, Esmond Plants, M.D., Ph.D. 09/14/2016  [place], [place]

## 2016-09-14 NOTE — Patient Instructions (Signed)

## 2016-09-20 DIAGNOSIS — E291 Testicular hypofunction: Secondary | ICD-10-CM | POA: Diagnosis not present

## 2016-09-20 DIAGNOSIS — R3914 Feeling of incomplete bladder emptying: Secondary | ICD-10-CM | POA: Diagnosis not present

## 2016-09-20 DIAGNOSIS — N401 Enlarged prostate with lower urinary tract symptoms: Secondary | ICD-10-CM | POA: Diagnosis not present

## 2016-09-20 DIAGNOSIS — R350 Frequency of micturition: Secondary | ICD-10-CM | POA: Diagnosis not present

## 2016-09-21 DIAGNOSIS — N401 Enlarged prostate with lower urinary tract symptoms: Secondary | ICD-10-CM | POA: Diagnosis not present

## 2016-09-21 DIAGNOSIS — E291 Testicular hypofunction: Secondary | ICD-10-CM | POA: Diagnosis not present

## 2016-09-21 DIAGNOSIS — Z79899 Other long term (current) drug therapy: Secondary | ICD-10-CM | POA: Diagnosis not present

## 2016-09-29 ENCOUNTER — Other Ambulatory Visit: Payer: Self-pay | Admitting: Family Medicine

## 2016-10-13 ENCOUNTER — Ambulatory Visit (INDEPENDENT_AMBULATORY_CARE_PROVIDER_SITE_OTHER): Payer: Medicare Other | Admitting: Family Medicine

## 2016-10-13 ENCOUNTER — Encounter: Payer: Self-pay | Admitting: Family Medicine

## 2016-10-13 VITALS — BP 122/68 | HR 68 | Temp 98.1°F | Resp 16 | Wt 259.0 lb

## 2016-10-13 DIAGNOSIS — G629 Polyneuropathy, unspecified: Secondary | ICD-10-CM

## 2016-10-13 DIAGNOSIS — IMO0002 Reserved for concepts with insufficient information to code with codable children: Secondary | ICD-10-CM

## 2016-10-13 DIAGNOSIS — Z23 Encounter for immunization: Secondary | ICD-10-CM

## 2016-10-13 DIAGNOSIS — Z794 Long term (current) use of insulin: Secondary | ICD-10-CM

## 2016-10-13 DIAGNOSIS — E1165 Type 2 diabetes mellitus with hyperglycemia: Secondary | ICD-10-CM | POA: Diagnosis not present

## 2016-10-13 DIAGNOSIS — E118 Type 2 diabetes mellitus with unspecified complications: Secondary | ICD-10-CM

## 2016-10-13 LAB — POCT GLYCOSYLATED HEMOGLOBIN (HGB A1C)
Est. average glucose Bld gHb Est-mCnc: 186
Hemoglobin A1C: 8.1

## 2016-10-13 NOTE — Progress Notes (Signed)
[name]    DOB: 1951/06/22   65 y.o.   MRN: [id number] Visit Date: 10/13/2016  Today's Provider: [name], MD   Chief Complaint  Patient presents with  . Diabetes  . Back Pain   Subjective:    HPI    Diabetes Mellitus Type II, Follow-up:   Lab Results  Component Value Date   HGBA1C 7.8 07/12/2016   HGBA1C 6.3 03/30/2016   HGBA1C 9.1 12/10/2015    Last seen for diabetes 3 months ago.  Management since then includes none. He reports excellent compliance with treatment. He is not having side effects. Current symptoms include hyperglycemia, nausea and paresthesia of the feet and have been stable. Home blood sugar records: 160's to 200's  Episodes of hypoglycemia? no   Most Recent Eye Exam: May 2016 Weight trend: increasing steadily Prior visit with dietician: yes - when first diagnosed. Current diet: well balanced Current exercise: "some"; not able to do a lot secondary to pain  Pertinent Labs:    Component Value Date/Time   CHOL 89 (L) 12/11/2015 1014   CHOL 93 04/03/2013 0209   TRIG 298 (H) 12/11/2015 1014   TRIG 163 04/03/2013 0209   HDL 26 (L) 12/11/2015 1014   HDL 25 (L) 04/03/2013 0209   LDLCALC 3 12/11/2015 1014   LDLCALC 35 04/03/2013 0209   CREATININE 1.18 12/11/2015 1014   CREATININE 0.98 04/12/2013 1150    Wt Readings from Last 3 Encounters:  10/13/16 259 lb (117.5 kg)  09/14/16 252 lb 12 oz (114.6 kg)  07/12/16 251 lb (113.9 kg)    ------------------------------------------------------------------------  Follow up for Neuropathy  The patient was last seen for this 3 months ago. Changes made at last visit include adding Lyrica.  He reports excellent compliance with treatment. He feels that condition is Improved. States this is 75% improved. He is not having side effects.  ------------------------------------------------------------------------------------        Allergies  Allergen  Reactions  . Carisoprodol Other (See Comments)    Reaction:  Unknown      Current Outpatient Prescriptions:  .  amiodarone (PACERONE) 200 MG tablet, Take 200 mg by mouth daily., Disp: , Rfl:  .  amitriptyline (ELAVIL) 25 MG tablet, Take 1 tablet (25 mg total) by mouth at bedtime., Disp: 90 tablet, Rfl: 3 .  aspirin EC 81 MG tablet, Take 81 mg by mouth at bedtime., Disp: , Rfl:  .  atorvastatin (LIPITOR) 40 MG tablet, TAKE 1 TABLET (40 MG TOTAL) BY MOUTH DAILY AT 6 PM., Disp: 90 tablet, Rfl: 3 .  budesonide (RHINOCORT AQUA) 32 MCG/ACT nasal spray, Place 1 spray into the nose daily as needed for rhinitis. , Disp: , Rfl:  .  cyclobenzaprine (FLEXERIL) 10 MG tablet, TAKE 1 TABLET BY MOUTH 3 TIMES A DAY, Disp: 90 tablet, Rfl: 12 .  dutasteride (AVODART) 0.5 MG capsule, Take 1 capsule (0.5 mg total) by mouth daily., Disp: 90 capsule, Rfl: 3 .  esomeprazole (NEXIUM) 40 MG capsule, TAKE 1 CAPSULE TWICE DAILY, Disp: 90 capsule, Rfl: 3 .  fexofenadine (ALLEGRA) 180 MG tablet, Take 180 mg by mouth daily.  , Disp: , Rfl:  .  furosemide (LASIX) 40 MG tablet, Take 1 tablet (40 mg total) by mouth 2 (two) times daily as needed., Disp: 180 tablet, Rfl: 3 .  gabapentin (NEURONTIN) 800 MG tablet, Take 1 tablet (800 mg total) by mouth 3 (three) times daily., Disp: 270 tablet, Rfl: 3 .  glucose blood (ONE TOUCH ULTRA TEST) test strip, 1 each by Other route 2 (two) times daily. Use as instructed Dx- E11.8, Disp: 300 each, Rfl: 3 .  hydrochlorothiazide (HYDRODIURIL) 12.5 MG tablet, Take 2 tablets (25 mg total) by mouth at bedtime. (Patient taking differently: Take 12.5 mg by mouth at bedtime. ), Disp: 30 tablet, Rfl: 12 .  insulin aspart (NOVOLOG FLEXPEN) 100 UNIT/ML FlexPen, 6 units three times daily-samples given at this time, Disp: 15 mL, Rfl: 3 .  LANTUS SOLOSTAR 100 UNIT/ML Solostar Pen, 22 Units daily at 10 pm. , Disp: , Rfl:  .  Liraglutide (VICTOZA) 18 MG/3ML SOPN, Inject 0.3 mLs (1.8 mg total) into the skin  daily., Disp: 6 mL, Rfl: 3 .  magnesium oxide (MAG-OX) 400 (241.3 MG) MG tablet, Take 1 tablet (400 mg total) by mouth 2 (two) times daily. (Patient taking differently: Take 800 mg by mouth 2 (two) times daily. ), Disp: 180 tablet, Rfl: 3 .  metFORMIN (GLUCOPHAGE) 1000 MG tablet, TAKE 1 TABLET (1,000 MG TOTAL) BY MOUTH 2 (TWO) TIMES DAILY., Disp: , Rfl:  .  metoprolol (LOPRESSOR) 100 MG tablet, Take 1 tablet (100 mg total) by mouth 2 (two) times daily., Disp: 60 tablet, Rfl: 12 .  mometasone (ELOCON) 0.1 % cream, Apply 1 application topically daily. For 2 weeks, Disp: 45 g, Rfl: 0 .  Multiple Vitamin (MULTIVITAMIN WITH MINERALS) TABS tablet, Take 1 tablet by mouth daily., Disp: , Rfl:  .  Omega-3 Fatty Acids (FISH OIL) 1200 MG CAPS, Take 1,200 mg by mouth 2 (two) times daily., Disp: , Rfl:  .  oxyCODONE (OXYCONTIN) 40 mg 12 hr tablet, Take 1 tablet (40 mg total) by mouth every 8 (eight) hours as needed., Disp: 270 tablet, Rfl: 0 .  pregabalin (LYRICA) 50 MG capsule, Take 1 capsule (50 mg total) by mouth 3 (three) times daily., Disp: 270 capsule, Rfl: 1 .  ranitidine (ZANTAC) 300 MG tablet, Take 300 mg by mouth daily., Disp: , Rfl:  .  testosterone cypionate (DEPOTESTOSTERONE CYPIONATE) 200 MG/ML injection, Inject 200 mg into the muscle every 21 ( twenty-one) days. , Disp: , Rfl:   Review of Systems  Constitutional: Negative for activity change, appetite change, chills, diaphoresis, fatigue, fever and unexpected weight change.  Eyes: Negative.   Respiratory: Negative.   Cardiovascular: Negative for chest pain and palpitations.  Gastrointestinal: Positive for nausea.  Endocrine: Negative for polydipsia, polyphagia and polyuria.  Musculoskeletal: Positive for arthralgias, back pain and myalgias.  Allergic/Immunologic: Negative.   Psychiatric/Behavioral: Negative.     Social History  Substance Use Topics  . Smoking status: Former Smoker    Packs/day: 1.00    Years: 25.00    Types: Cigarettes     Quit date: 12/26/1989  . Smokeless tobacco: Never Used  . Alcohol use No   Objective:   BP 122/68 (BP Location: Left Arm, Patient Position: Sitting, Cuff Size: Large)   Pulse 68   Temp 98.1 F (36.7 C) (Oral)   Resp 16   Wt 259 lb (117.5 kg)   BMI 36.12 kg/m   Physical Exam  Constitutional: He is oriented to person, place, and time. He appears well-developed and well-nourished.  HENT:  Head: Normocephalic and atraumatic.  Eyes: Conjunctivae are normal. No scleral icterus.  Neck: No thyromegaly present.  Cardiovascular: Normal rate, regular rhythm and normal heart sounds.   Pulmonary/Chest: Effort normal and breath sounds normal. No respiratory distress.  Abdominal: Soft. There is tenderness ( due to insulin shots ).  Musculoskeletal: He exhibits edema (1+).  Neurological: He is alert and oriented to person, place, and time.  Skin: Skin is warm and dry.  Psychiatric: He has a normal mood and affect. His behavior is normal. Judgment and thought content normal.       Assessment & Plan:     1. Uncontrolled type 2 diabetes mellitus with complication, with long-term current use of insulin (HCC) Stable. Encouraged diet. Advised pt to decrease sugar, potatoes, breads, etc. No need to make medication changes today.  - POCT glycosylated hemoglobin (Hb A1C) Results for orders placed or performed in visit on 10/13/16  POCT glycosylated hemoglobin (Hb A1C)  Result Value Ref Range   Hemoglobin A1C 8.1    Est. average glucose Bld gHb Est-mCnc 186      2. Neuropathy ([place]) Improving. Continue Lyrica. Does cause sleepiness, but the sleep is stable due to CPAP. He says this pain is 80% better Lyrica. He is very pleased with this. 3. Flu vaccine need Administered today. - Flu vaccine HIGH DOSE PF  4.Obstructive sleep apnea   he wears CPAP nightly 5. Osteoarthritis Knee surgery next month 6. Chronic back pain/DDD Patient seen and examined by Miguel Aschoff, MD, and note scribed  by Renaldo Fiddler, CMA.  I have done the exam and reviewed the chart and it is accurate to the best of my knowledge. Miguel Aschoff M.D. [place], MD  [facility]

## 2016-10-15 ENCOUNTER — Other Ambulatory Visit: Payer: Self-pay | Admitting: Family Medicine

## 2016-10-15 NOTE — Telephone Encounter (Signed)
Pt needs refill on his Nova Twist needles 17mm.  Rx quanity 200 needles.  [facility] [address]

## 2016-10-18 MED ORDER — PEN NEEDLES 31G X 5 MM MISC: 1.0000 | Freq: Every day | 3 refills | Status: DC

## 2016-10-18 NOTE — Telephone Encounter (Signed)
Done aa-aa

## 2016-10-20 ENCOUNTER — Telehealth: Payer: Self-pay | Admitting: Cardiovascular Disease

## 2016-10-20 NOTE — Telephone Encounter (Signed)
Received cardiac clearance request for pt to proceed w/ Rt TKA - medial & lateral w/wo patella resurfacing w/ Dr. Hector Shade on 12/06/16 @ [facility]. Please route clearance to [facility] @ [phone].

## 2016-10-20 NOTE — Telephone Encounter (Signed)
Acceptable risk for orthopedic surgery in December No further testing needed

## 2016-10-22 ENCOUNTER — Ambulatory Visit: Payer: Self-pay | Admitting: Orthopedic Surgery

## 2016-10-25 ENCOUNTER — Other Ambulatory Visit: Payer: Self-pay

## 2016-10-25 DIAGNOSIS — Z794 Long term (current) use of insulin: Principal | ICD-10-CM

## 2016-10-25 DIAGNOSIS — E118 Type 2 diabetes mellitus with unspecified complications: Principal | ICD-10-CM

## 2016-10-25 DIAGNOSIS — E1165 Type 2 diabetes mellitus with hyperglycemia: Secondary | ICD-10-CM

## 2016-10-25 DIAGNOSIS — IMO0002 Reserved for concepts with insufficient information to code with codable children: Secondary | ICD-10-CM

## 2016-10-25 MED ORDER — GLUCOSE BLOOD VI STRP: ORAL_STRIP | 3 refills | Status: DC

## 2016-10-26 DIAGNOSIS — M1711 Unilateral primary osteoarthritis, right knee: Secondary | ICD-10-CM | POA: Diagnosis not present

## 2016-10-31 ENCOUNTER — Other Ambulatory Visit: Payer: Self-pay | Admitting: Cardiovascular Disease

## 2016-11-01 NOTE — Telephone Encounter (Signed)
Routed to fax # provided. 

## 2016-11-11 ENCOUNTER — Ambulatory Visit (INDEPENDENT_AMBULATORY_CARE_PROVIDER_SITE_OTHER): Payer: Medicare Other | Admitting: Family Medicine

## 2016-11-11 VITALS — BP 130/62 | HR 84 | Temp 98.2°F | Resp 16 | Ht 71.0 in | Wt 257.0 lb

## 2016-11-11 DIAGNOSIS — Z01810 Encounter for preprocedural cardiovascular examination: Secondary | ICD-10-CM

## 2016-11-11 NOTE — Progress Notes (Addendum)
Jerry Parsons  MRN: [id number] DOB: 06-18-1951  Subjective:  HPI  The patient is a 65 year old male who presents for pre-op evaluation.  The patient is scheduled to have right knee replacement on 12/06/16 by Dr Maureen Ralphs.  The patient is scheduled to be seen in his office in 2 weeks.   The patient has had anesthesia before and has not had any difficulty.  He has o family history of difficulty with anesthesia.  The patient is on aspirin and plans to discontinue it prior tot  the surgery.  He is a former smoker who stopped in 1993.  He is a non drinker and does not have any bleeding or clotting disorders.   Patient plans on going home after surgery and has family that will be attending to him. He and is wife are present and do not have any questions at this time.  Patient Active Problem List   Diagnosis Date Noted  . Neuropathy ([place]) 10/13/2016  . Injury of kidney 01/21/2016  . Amblyopia 12/30/2015  . 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 NS (nuclear sclerosis) 12/30/2015  . Pseudoaphakia 12/30/2015  . CVA (cerebral infarction) 09/08/2015  . Dehydration 09/08/2015  . Aspiration pneumonia ([place]) 07/04/2015  . Paroxysmal atrial fibrillation ([place]) 07/04/2015  . Arthritis of knee, degenerative 05/28/2015  . Chronic back pain 09/17/2014  . Leg edema 05/07/2014  . Chronic diastolic CHF (congestive heart failure) ([place]) 05/07/2014  . Campylobacter diarrhea 04/25/2013  . Atrial flutter ([place]) 01/23/2013  . Obesity 05/19/2012  . Hyperlipidemia 11/11/2011  . Diastolic dysfunction 0000000  . SOB (shortness of breath) 04/12/2011  . Diabetes mellitus type 2, uncontrolled, with complications ([place]) [id number]  . HYPERTENSION, BENIGN 03/15/2011  . DVT 03/15/2011  . TACHYCARDIA 03/15/2011    Past Medical History:  Diagnosis Date  . Arrhythmia    tachycardia, A-Fib  . Blind right eye   . BPH (benign prostatic hyperplasia)   . Depression   . Diabetes mellitus   . DVT (deep venous thrombosis) ([place])  02/2010  . 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 GERD (gastroesophageal reflux disease)   . Hypercholesteremia   . Hypertension   . Kidney failure   . Neuropathy ([place])   . Pulmonary embolus ([place]) 2011  . Seasonal allergies   . Seizures ([place])    as child   . Sleep apnea    severe, no machine  . Stiff neck    limited turning s/p titanium plate placement  . TIA (transient ischemic attack)   . Wears dentures    full upper and lower    Social History   Social History  . Marital status: Married    Spouse name: N/A  . Number of children: N/A  . Years of education: N/A   Occupational History  . Not on file.   Social History Main Topics  . Smoking status: Former Smoker    Packs/day: 1.00    Years: 25.00    Types: Cigarettes    Quit date: 12/26/1989  . Smokeless tobacco: Never Used  . Alcohol use No  . Drug use: No  . Sexual activity: Not on file   Other Topics Concern  . Not on file   Social History Narrative  . No narrative on file    Outpatient Encounter Prescriptions as of 11/11/2016  Medication Sig Note  . amiodarone (PACERONE) 200 MG tablet TAKE 1 TABLET (200 MG TOTAL) BY MOUTH 2 (TWO) TIMES DAILY.   [name]  amitriptyline (ELAVIL) 25 MG tablet Take 1 tablet (25 mg total) by mouth at bedtime.   [name] aspirin EC 81 MG tablet Take 81 mg by mouth at bedtime.   [name] atorvastatin (LIPITOR) 40 MG tablet TAKE 1 TABLET (40 MG TOTAL) BY MOUTH DAILY AT 6 PM.   . budesonide (RHINOCORT AQUA) 32 MCG/ACT nasal spray Place 1 spray into the nose daily as needed for rhinitis.    . cyclobenzaprine (FLEXERIL) 10 MG tablet TAKE 1 TABLET BY MOUTH 3 TIMES A DAY   . dutasteride (AVODART) 0.5 MG capsule Take 1 capsule (0.5 mg total) by mouth daily.   [name] esomeprazole (NEXIUM) 40 MG capsule TAKE 1 CAPSULE TWICE DAILY   . fexofenadine (ALLEGRA) 180 MG tablet Take 180 mg by mouth daily.     . furosemide (LASIX) 40 MG tablet Take 1 tablet (40 mg total) by mouth 2 (two) times daily as  needed.   . gabapentin (NEURONTIN) 800 MG tablet Take 1 tablet (800 mg total) by mouth 3 (three) times daily.   [name] glucose blood (ONE TOUCH ULTRA TEST) test strip Check sugar twice daily. Dx- E11.8   . hydrochlorothiazide (HYDRODIURIL) 12.5 MG tablet Take 2 tablets (25 mg total) by mouth at bedtime. (Patient taking differently: Take 12.5 mg by mouth at bedtime. )   . insulin aspart (NOVOLOG FLEXPEN) 100 UNIT/ML FlexPen 6 units three times daily-samples given at this time   . Insulin Pen Needle (PEN NEEDLES) 31G X 5 MM MISC 1 each by Does not apply route daily. PATIENT NEEDS NOVA TWIST NEEDLES 5 MM  DX E11.9   . LANTUS SOLOSTAR 100 UNIT/ML Solostar Pen 22 Units daily at 10 pm.  03/02/2016: Received from: External Pharmacy  . Liraglutide (VICTOZA) 18 MG/3ML SOPN Inject 0.3 mLs (1.8 mg total) into the skin daily.   . magnesium oxide (MAG-OX) 400 (241.3 MG) MG tablet Take 1 tablet (400 mg total) by mouth 2 (two) times daily. (Patient taking differently: Take 800 mg by mouth 2 (two) times daily. )   . metFORMIN (GLUCOPHAGE) 1000 MG tablet TAKE 1 TABLET (1,000 MG TOTAL) BY MOUTH 2 (TWO) TIMES DAILY. 01/12/2016: Received from: [facility]  . metoprolol (LOPRESSOR) 100 MG tablet Take 1 tablet (100 mg total) by mouth 2 (two) times daily.   . mometasone (ELOCON) 0.1 % cream Apply 1 application topically daily. For 2 weeks   . Multiple Vitamin (MULTIVITAMIN WITH MINERALS) TABS tablet Take 1 tablet by mouth daily.   . Omega-3 Fatty Acids (FISH OIL) 1200 MG CAPS Take 1,200 mg by mouth 2 (two) times daily.   [name] oxyCODONE (OXYCONTIN) 40 mg 12 hr tablet Take 1 tablet (40 mg total) by mouth every 8 (eight) hours as needed.   . pregabalin (LYRICA) 50 MG capsule Take 1 capsule (50 mg total) by mouth 3 (three) times daily.   . ranitidine (ZANTAC) 300 MG tablet Take 300 mg by mouth daily.   . sitaGLIPtin (JANUVIA) 100 MG tablet Take 100 mg by mouth daily.   [name] testosterone cypionate (DEPOTESTOSTERONE  CYPIONATE) 200 MG/ML injection Inject 200 mg into the muscle every 21 ( twenty-one) days.    . [DISCONTINUED] amiodarone (PACERONE) 200 MG tablet Take 200 mg by mouth daily.    No facility-administered encounter medications on file as of 11/11/2016.     Allergies  Allergen Reactions  . Carisoprodol Other (See Comments)    Reaction:  Unknown     Review of Systems  Constitutional: Negative for  fever and malaise/fatigue.  Respiratory: Negative for cough, shortness of breath and wheezing.   Cardiovascular: Negative for chest pain, palpitations, orthopnea, claudication, leg swelling and PND.  Gastrointestinal: Negative.   Neurological: Negative for dizziness, weakness and headaches.       Positive for neurogenic claudication. Entire leg hurts--not calf pain.  Endo/Heme/Allergies: Negative.   Psychiatric/Behavioral: Negative.     Objective:  BP 130/62 (BP Location: Right Arm, Patient Position: Sitting, Cuff Size: Normal)   Pulse 84   Temp 98.2 F (36.8 C) (Oral)   Resp 16   Ht 5\' 11"  (1.803 m)   Wt 257 lb (116.6 kg)   BMI 35.84 kg/m   Physical Exam  Constitutional: He is oriented to person, place, and time and well-developed, well-nourished, and in no distress.  HENT:  Head: Normocephalic and atraumatic.  Right Ear: External ear normal.  Left Ear: External ear normal.  Nose: Nose normal.  Eyes: Conjunctivae are normal.  Neck: Neck supple. No thyromegaly present.  Cardiovascular: Normal rate, regular rhythm and normal heart sounds.   Pulmonary/Chest: Effort normal and breath sounds normal.  Abdominal: Soft.  Lymphadenopathy:    He has no cervical adenopathy.  Neurological: He is alert and oriented to person, place, and time. No cranial nerve deficit. He exhibits normal muscle tone.  Skin: Skin is warm and dry.  Psychiatric: Mood, memory, affect and judgment normal.    Assessment and Plan :  Knee OA Pt cleared for TKR Severe DDD Chronic Pain TIIDM HTN HLD OSA Pt  wears Bipap nightly and feels much better. Myalgia Stop lipitor until next visit. I have done the exam and reviewed the chart and it is accurate to the best of my knowledge. Development worker, community has been used and  any errors in dictation or transcription are unintentional. Miguel Aschoff M.D. [facility]

## 2016-11-12 DIAGNOSIS — E291 Testicular hypofunction: Secondary | ICD-10-CM | POA: Diagnosis not present

## 2016-11-17 ENCOUNTER — Other Ambulatory Visit: Payer: Self-pay | Admitting: Family Medicine

## 2016-11-17 DIAGNOSIS — G629 Polyneuropathy, unspecified: Secondary | ICD-10-CM

## 2016-11-23 DIAGNOSIS — M1711 Unilateral primary osteoarthritis, right knee: Secondary | ICD-10-CM | POA: Diagnosis not present

## 2016-11-29 ENCOUNTER — Encounter (HOSPITAL_COMMUNITY)
Admission: RE | Admit: 2016-11-29 | Discharge: 2016-11-29 | Disposition: A | Discharge status: Home / Self Care | Payer: Medicare Other | Source: Ambulatory Visit | Attending: Orthopedic Surgery | Admitting: Orthopedic Surgery

## 2016-11-29 ENCOUNTER — Encounter (HOSPITAL_COMMUNITY): Payer: Self-pay

## 2016-11-29 DIAGNOSIS — E119 Type 2 diabetes mellitus without complications: Secondary | ICD-10-CM | POA: Diagnosis not present

## 2016-11-29 DIAGNOSIS — M1711 Unilateral primary osteoarthritis, right knee: Secondary | ICD-10-CM | POA: Insufficient documentation

## 2016-11-29 DIAGNOSIS — Z01818 Encounter for other preprocedural examination: Secondary | ICD-10-CM | POA: Diagnosis not present

## 2016-11-29 HISTORY — DX: Cardiac arrhythmia, unspecified: I49.9

## 2016-11-29 HISTORY — DX: Unspecified osteoarthritis, unspecified site: M19.90

## 2016-11-29 HISTORY — DX: Pneumonia, unspecified organism: J18.9

## 2016-11-29 LAB — URINALYSIS, ROUTINE W REFLEX MICROSCOPIC
Bilirubin Urine: NEGATIVE
Glucose, UA: >1000 mg/dL — AB
Hgb urine dipstick: NEGATIVE
Ketones, ur: NEGATIVE mg/dL
Leukocytes, UA: NEGATIVE
Nitrite: NEGATIVE
Protein, ur: NEGATIVE mg/dL
Specific Gravity, Urine: 1.025 (ref 1.005–1.030)
pH: 7 (ref 5.0–8.0)

## 2016-11-29 LAB — COMPREHENSIVE METABOLIC PANEL
ALT: 40 U/L (ref 17–63)
AST: 35 U/L (ref 15–41)
Albumin: 4.3 g/dL (ref 3.5–5.0)
Alkaline Phosphatase: 96 U/L (ref 38–126)
Anion gap: 9 (ref 5–15)
BUN: 20 mg/dL (ref 6–20)
CO2: 30 mmol/L (ref 22–32)
Calcium: 9.5 mg/dL (ref 8.9–10.3)
Chloride: 101 mmol/L (ref 101–111)
Creatinine, Ser: 1.06 mg/dL (ref 0.61–1.24)
GFR calc Af Amer: >60 mL/min (ref >60)
GFR calc non Af Amer: >60 mL/min (ref >60)
Glucose, Bld: 177 mg/dL — ABNORMAL HIGH (ref 65–99)
Potassium: 4.2 mmol/L (ref 3.5–5.1)
Sodium: 140 mmol/L (ref 135–145)
Total Bilirubin: 0.6 mg/dL (ref 0.3–1.2)
Total Protein: 7.3 g/dL (ref 6.5–8.1)

## 2016-11-29 LAB — CBC
HCT: 41.1 % (ref 39.0–52.0)
Hemoglobin: 13.1 g/dL (ref 13.0–17.0)
MCH: 27.3 pg (ref 26.0–34.0)
MCHC: 31.9 g/dL (ref 30.0–36.0)
MCV: 85.8 fL (ref 78.0–100.0)
Platelets: 148 10*3/uL — ABNORMAL LOW (ref 150–400)
RBC: 4.79 MIL/uL (ref 4.22–5.81)
RDW: 15.9 % — ABNORMAL HIGH (ref 11.5–15.5)
WBC: 7.2 10*3/uL (ref 4.0–10.5)

## 2016-11-29 LAB — APTT: aPTT: 33 seconds (ref 24–36)

## 2016-11-29 LAB — URINE MICROSCOPIC-ADD ON
Bacteria, UA: NONE SEEN
RBC / HPF: NONE SEEN RBC/hpf (ref 0–5)
WBC, UA: NONE SEEN WBC/hpf (ref 0–5)

## 2016-11-29 LAB — SURGICAL PCR SCREEN
MRSA, PCR: NEGATIVE
Staphylococcus aureus: NEGATIVE

## 2016-11-29 LAB — ABO/RH: ABO/RH(D): O POS

## 2016-11-29 LAB — PROTIME-INR
INR: 1.03
Prothrombin Time: 13.5 seconds (ref 11.4–15.2)

## 2016-11-29 LAB — GLUCOSE, CAPILLARY: Glucose-Capillary: 208 mg/dL — ABNORMAL HIGH (ref 65–99)

## 2016-11-29 NOTE — Progress Notes (Signed)
Pt and his wife aware to bring BIPAP mask and tubing day of surgery Clearance note per chart per Dr Rockey Situ 10/20/2016

## 2016-11-29 NOTE — Patient Instructions (Addendum)
Jerry Parsons  11/29/2016   Your procedure is scheduled on: Monday December 06, 2016  Report to [facility] Main  Entrance take [place]  elevators to 3rd floor to  [place] at 10:45 AM.  Call this number if you have problems the morning of surgery [phone]   Remember: ONLY 1 PERSON MAY GO WITH YOU TO SHORT STAY TO GET  READY MORNING OF [place].  Do not eat food After Midnight but may take clear liquids till 7:45 am day of surgery then nothing by mouth.      CLEAR LIQUID DIET   Foods Allowed                                                                     Foods Excluded  Coffee and tea, regular and decaf                             liquids that you cannot  Plain Jell-O in any flavor                                             see through such as: Fruit ices (not with fruit pulp)                                     milk, soups, orange juice  Iced Popsicles                                    All solid food Carbonated beverages, regular and diet                                    Cranberry, grape and apple juices Sports drinks like Gatorade Lightly seasoned clear broth or consume(fat free) Sugar, honey syrup  Sample Menu Breakfast                                Lunch                                     Supper Cranberry juice                    Beef broth                            Chicken broth Jell-O                                     Grape juice  Apple juice Coffee or tea                        Jell-O                                      Popsicle                                                Coffee or tea                        Coffee or tea  _____________________________________________________________________       Take these medicines the morning of surgery with A SIP OF WATER: Amiodorone, Dutasteride (Avodart), Nexium; Fexofenadine (Allegra); Ranitidine (Zantac); Metoprolol; Oxycoodone; Lyrica; May use  Rhinocort Nasal Spray if needed; Gabapentin   How to Manage Your Diabetes Before and After Surgery  Why is it important to control my blood sugar before and after surgery? . Improving blood sugar levels before and after surgery helps healing and can limit problems. . A way of improving blood sugar control is eating a healthy diet by: o  Eating less sugar and carbohydrates o  Increasing activity/exercise o  Talking with your doctor about reaching your blood sugar goals . High blood sugars (greater than 180 mg/dL) can raise your risk of infections and slow your recovery, so you will need to focus on controlling your diabetes during the weeks before surgery. . Make sure that the doctor who takes care of your diabetes knows about your planned surgery including the date and location.  How do I manage my blood sugar before surgery? . Check your blood sugar at least 4 times a day, starting 2 days before surgery, to make sure that the level is not too high or low. o Check your blood sugar the morning of your surgery when you wake up and every 2 hours until you get to the Short Stay unit. . If your blood sugar is less than 70 mg/dL, you will need to treat for low blood sugar: o Do not take insulin. o Treat a low blood sugar (less than 70 mg/dL) with  cup of clear juice (cranberry or apple), 4 glucose tablets, OR glucose gel. o Recheck blood sugar in 15 minutes after treatment (to make sure it is greater than 70 mg/dL). If your blood sugar is not greater than 70 mg/dL on recheck, call [phone] for further instructions. . Report your blood sugar to the short stay nurse when you get to Short Stay.  . If you are admitted to the hospital after surgery: o Your blood sugar will be checked by the staff and you will probably be given insulin after surgery (instead of oral diabetes medicines) to make sure you have good blood sugar levels. o The goal for blood sugar control after surgery is 80-180  mg/dL.   WHAT DO I DO ABOUT MY DIABETES MEDICATION?  [name] Do not take oral diabetes medicines (pills) the morning of surgery.  . THE NIGHT BEFORE SURGERY, take 11 units of Lantus insulin.       . The day of surgery, do not take other diabetes injectables, including Byetta (exenatide), Bydureon (exenatide ER), Victoza (liraglutide), or Trulicity (dulaglutide).  [name]  If your CBG is greater than 220 mg/dL, you may take  of your sliding scale  . (correction) dose of insulin.    Patient Signature:  Date:   Nurse Signature:  Date:   Reviewed and Endorsed by [facility] Patient Education Committee, August 2015  DO NOT TAKE ANY DIABETIC MEDICATIONS DAY OF YOUR SURGERY                               You may not have any metal on your body including hair pins and              piercings  Do not wear jewelry,  lotions, powders or colognes, deodorant                      Men may shave face and neck.   Do not bring valuables to the hospital. [place].  Contacts, dentures or bridgework may not be worn into surgery.  Leave suitcase in the car. After surgery it may be brought to your room.                Please read over the following fact sheets you were given:MRSA INFORMATION SHEET; INCENTIVE SPIROMETER; BLOOD TRANSFUSION INFORMATION SHEET  _____________________________________________________________________             [facility] - Preparing for Surgery Before surgery, you can play an important role.  Because skin is not sterile, your skin needs to be as free of germs as possible.  You can reduce the number of germs on your skin by washing with CHG (chlorahexidine gluconate) soap before surgery.  CHG is an antiseptic cleaner which kills germs and bonds with the skin to continue killing germs even after washing. Please DO NOT use if you have an allergy to CHG or antibacterial soaps.  If your skin becomes reddened/irritated stop using the CHG  and inform your nurse when you arrive at Short Stay. Do not shave (including legs and underarms) for at least 48 hours prior to the first CHG shower.  You may shave your face/neck. Please follow these instructions carefully:  1.  Shower with CHG Soap the night before surgery and the  morning of Surgery.  2.  If you choose to wash your hair, wash your hair first as usual with your  normal  shampoo.  3.  After you shampoo, rinse your hair and body thoroughly to remove the  shampoo.                           4.  Use CHG as you would any other liquid soap.  You can apply chg directly  to the skin and wash                       Gently with a scrungie or clean washcloth.  5.  Apply the CHG Soap to your body ONLY FROM THE NECK DOWN.   Do not use on face/ open                           Wound or open sores. Avoid contact with eyes, ears mouth and genitals (private parts).  Wash face,  Genitals (private parts) with your normal soap.             6.  Wash thoroughly, paying special attention to the area where your surgery  will be performed.  7.  Thoroughly rinse your body with warm water from the neck down.  8.  DO NOT shower/wash with your normal soap after using and rinsing off  the CHG Soap.                9.  Pat yourself dry with a clean towel.            10.  Wear clean pajamas.            11.  Place clean sheets on your bed the night of your first shower and do not  sleep with pets. Day of Surgery : Do not apply any lotions/deodorants the morning of surgery.  Please wear clean clothes to the hospital/surgery center.  FAILURE TO FOLLOW THESE INSTRUCTIONS MAY RESULT IN THE CANCELLATION OF YOUR SURGERY PATIENT SIGNATURE_________________________________  NURSE SIGNATURE__________________________________  ________________________________________________________________________   Adam Phenix  An incentive spirometer is a tool that can help keep your lungs clear and  active. This tool measures how well you are filling your lungs with each breath. Taking long deep breaths may help reverse or decrease the chance of developing breathing (pulmonary) problems (especially infection) following:  A long period of time when you are unable to move or be active. BEFORE THE PROCEDURE   If the spirometer includes an indicator to show your best effort, your nurse or respiratory therapist will set it to a desired goal.  If possible, sit up straight or lean slightly forward. Try not to slouch.  Hold the incentive spirometer in an upright position. INSTRUCTIONS FOR USE  1. Sit on the edge of your bed if possible, or sit up as far as you can in bed or on a chair. 2. Hold the incentive spirometer in an upright position. 3. Breathe out normally. 4. Place the mouthpiece in your mouth and seal your lips tightly around it. 5. Breathe in slowly and as deeply as possible, raising the piston or the ball toward the top of the column. 6. Hold your breath for 3-5 seconds or for as long as possible. Allow the piston or ball to fall to the bottom of the column. 7. Remove the mouthpiece from your mouth and breathe out normally. 8. Rest for a few seconds and repeat Steps 1 through 7 at least 10 times every 1-2 hours when you are awake. Take your time and take a few normal breaths between deep breaths. 9. The spirometer may include an indicator to show your best effort. Use the indicator as a goal to work toward during each repetition. 10. After each set of 10 deep breaths, practice coughing to be sure your lungs are clear. If you have an incision (the cut made at the time of surgery), support your incision when coughing by placing a pillow or rolled up towels firmly against it. Once you are able to get out of bed, walk around indoors and cough well. You may stop using the incentive spirometer when instructed by your caregiver.  RISKS AND COMPLICATIONS  Take your time so you do not get  dizzy or light-headed.  If you are in pain, you may need to take or ask for pain medication before doing incentive spirometry. It is harder to take a deep breath if you are having  pain. AFTER USE  Rest and breathe slowly and easily.  It can be helpful to keep track of a log of your progress. Your caregiver can provide you with a simple table to help with this. If you are using the spirometer at home, follow these instructions: [place] IF:   You are having difficultly using the spirometer.  You have trouble using the spirometer as often as instructed.  Your pain medication is not giving enough relief while using the spirometer.  You develop fever of 100.5 F (38.1 C) or higher. SEEK IMMEDIATE MEDICAL CARE IF:   You cough up bloody sputum that had not been present before.  You develop fever of 102 F (38.9 C) or greater.  You develop worsening pain at or near the incision site. MAKE SURE YOU:   Understand these instructions.  Will watch your condition.  Will get help right away if you are not doing well or get worse. Document Released: 04/25/2007 Document Revised: 03/06/2012 Document Reviewed: 06/26/2007 Exit[employer] Patient Information 2014 Exit[employer], [place].   ________________________________________________________________________  WHAT IS A BLOOD TRANSFUSION? Blood Transfusion Information  A transfusion is the replacement of blood or some of its parts. Blood is made up of multiple cells which provide different functions.  Red blood cells carry oxygen and are used for blood loss replacement.  White blood cells fight against infection.  Platelets control bleeding.  Plasma helps clot blood.  Other blood products are available for specialized needs, such as hemophilia or other clotting disorders. BEFORE THE TRANSFUSION  Who gives blood for transfusions?   Healthy volunteers who are fully evaluated to make sure their blood is safe. This is blood bank  blood. Transfusion therapy is the safest it has ever been in the practice of medicine. Before blood is taken from a donor, a complete history is taken to make sure that person has no history of diseases nor engages in risky social behavior (examples are intravenous drug use or sexual activity with multiple partners). The donor's travel history is screened to minimize risk of transmitting infections, such as malaria. The donated blood is tested for signs of infectious diseases, such as HIV and hepatitis. The blood is then tested to be sure it is compatible with you in order to minimize the chance of a transfusion reaction. If you or a relative donates blood, this is often done in anticipation of surgery and is not appropriate for emergency situations. It takes many days to process the donated blood. RISKS AND COMPLICATIONS Although transfusion therapy is very safe and saves many lives, the main dangers of transfusion include:   Getting an infectious disease.  Developing a transfusion reaction. This is an allergic reaction to something in the blood you were given. Every precaution is taken to prevent this. The decision to have a blood transfusion has been considered carefully by your caregiver before blood is given. Blood is not given unless the benefits outweigh the risks. AFTER THE TRANSFUSION  Right after receiving a blood transfusion, you will usually feel much better and more energetic. This is especially true if your red blood cells have gotten low (anemic). The transfusion raises the level of the red blood cells which carry oxygen, and this usually causes an energy increase.  The nurse administering the transfusion will monitor you carefully for complications. HOME CARE INSTRUCTIONS  No special instructions are needed after a transfusion. You may find your energy is better. Speak with your caregiver about any limitations on activity for underlying diseases  you may have. SEEK MEDICAL CARE IF:    Your condition is not improving after your transfusion.  You develop redness or irritation at the intravenous (IV) site. SEEK IMMEDIATE MEDICAL CARE IF:  Any of the following symptoms occur over the next 12 hours:  Shaking chills.  You have a temperature by mouth above 102 F (38.9 C), not controlled by medicine.  Chest, back, or muscle pain.  People around you feel you are not acting correctly or are confused.  Shortness of breath or difficulty breathing.  Dizziness and fainting.  You get a rash or develop hives.  You have a decrease in urine output.  Your urine turns a dark color or changes to pink, red, or brown. Any of the following symptoms occur over the next 10 days:  You have a temperature by mouth above 102 F (38.9 C), not controlled by medicine.  Shortness of breath.  Weakness after normal activity.  The white part of the eye turns yellow (jaundice).  You have a decrease in the amount of urine or are urinating less often.  Your urine turns a dark color or changes to pink, red, or brown. Document Released: 12/10/2000 Document Revised: 03/06/2012 Document Reviewed: 07/29/2008 [facility] Patient Information 2014 [place], [place].  _______________________________________________________________________

## 2016-11-30 LAB — HEMOGLOBIN A1C
Hgb A1c MFr Bld: 8.5 % — ABNORMAL HIGH (ref 4.8–5.6)
Mean Plasma Glucose: 197 mg/dL

## 2016-11-30 NOTE — Progress Notes (Addendum)
A1C,Urinalysis and micro results in epic per PAT visit 11/29/2016 sent to Dr Wynelle Link and Arlee Muslim PA

## 2016-12-06 ENCOUNTER — Encounter (HOSPITAL_COMMUNITY): Admission: RE | Payer: Self-pay | Source: Ambulatory Visit

## 2016-12-06 ENCOUNTER — Inpatient Hospital Stay (HOSPITAL_COMMUNITY): Admission: RE | Admit: 2016-12-06 | Payer: Medicare Other | Source: Ambulatory Visit | Admitting: Orthopedic Surgery

## 2016-12-06 LAB — TYPE AND SCREEN
ABO/RH(D): O POS
Antibody Screen: NEGATIVE

## 2016-12-06 SURGERY — ARTHROPLASTY, KNEE, TOTAL
Anesthesia: Spinal | Site: Knee | Laterality: Right

## 2016-12-20 ENCOUNTER — Other Ambulatory Visit: Payer: Self-pay | Admitting: Family Medicine

## 2016-12-24 ENCOUNTER — Other Ambulatory Visit: Payer: Self-pay | Admitting: Family Medicine

## 2016-12-24 NOTE — Telephone Encounter (Signed)
11/15/15. Please review. Thank you. sd

## 2016-12-24 NOTE — Telephone Encounter (Signed)
11/11/16 last ov. Last filled

## 2016-12-27 HISTORY — PX: JOINT REPLACEMENT: SHX530

## 2016-12-29 ENCOUNTER — Other Ambulatory Visit: Payer: Self-pay

## 2017-01-06 DIAGNOSIS — N401 Enlarged prostate with lower urinary tract symptoms: Secondary | ICD-10-CM | POA: Diagnosis not present

## 2017-01-06 DIAGNOSIS — E291 Testicular hypofunction: Secondary | ICD-10-CM | POA: Diagnosis not present

## 2017-01-06 DIAGNOSIS — N5201 Erectile dysfunction due to arterial insufficiency: Secondary | ICD-10-CM | POA: Diagnosis not present

## 2017-01-11 ENCOUNTER — Ambulatory Visit: Payer: Medicare Other | Admitting: Family Medicine

## 2017-01-11 ENCOUNTER — Ambulatory Visit: Payer: Medicare Other

## 2017-01-13 ENCOUNTER — Ambulatory Visit: Payer: Medicare Other | Admitting: Family Medicine

## 2017-01-17 ENCOUNTER — Ambulatory Visit (INDEPENDENT_AMBULATORY_CARE_PROVIDER_SITE_OTHER): Payer: Medicare Other | Admitting: Family Medicine

## 2017-01-17 ENCOUNTER — Encounter: Payer: Self-pay | Admitting: Family Medicine

## 2017-01-17 VITALS — BP 152/60 | HR 74 | Temp 98.1°F | Resp 16 | Wt 264.0 lb

## 2017-01-17 DIAGNOSIS — G8929 Other chronic pain: Secondary | ICD-10-CM | POA: Diagnosis not present

## 2017-01-17 DIAGNOSIS — M549 Dorsalgia, unspecified: Secondary | ICD-10-CM | POA: Diagnosis not present

## 2017-01-17 DIAGNOSIS — IMO0002 Reserved for concepts with insufficient information to code with codable children: Secondary | ICD-10-CM

## 2017-01-17 DIAGNOSIS — Z794 Long term (current) use of insulin: Secondary | ICD-10-CM | POA: Diagnosis not present

## 2017-01-17 DIAGNOSIS — E1165 Type 2 diabetes mellitus with hyperglycemia: Secondary | ICD-10-CM

## 2017-01-17 DIAGNOSIS — E118 Type 2 diabetes mellitus with unspecified complications: Secondary | ICD-10-CM

## 2017-01-17 MED ORDER — OXYCODONE HCL ER 40 MG PO T12A: 40.0000 mg | EXTENDED_RELEASE_TABLET | Freq: Three times a day (TID) | ORAL | 0 refills | Status: DC | PRN

## 2017-01-17 NOTE — Patient Instructions (Signed)
Increase Lantus 35 units and Novolog 14 units. Blood sugars need to be about 155 to have A1c below 7.0.

## 2017-01-17 NOTE — Progress Notes (Signed)
Subjective:  HPI Pt is here today to discuss his diabetes. He was suppose to have knee replacement but had to postponed because of his A1c being 8.5 (11/29/16). They want it to be under 7.0. He is taking Lantus 30 units two times daily. Novolog 12 units 3 times a day. Blood sugar was 147 this morning fasting.   Prior to Admission medications   Medication Sig Start Date End Date Taking? Authorizing Provider  acetaminophen (TYLENOL) 500 MG tablet Take 500 mg by mouth every 6 (six) hours as needed (for headache.).   Yes Historical Provider, MD  amiodarone (PACERONE) 200 MG tablet TAKE 1 TABLET (200 MG TOTAL) BY MOUTH 2 (TWO) TIMES DAILY. Patient taking differently: TAKE 1 TABLET (200 MG TOTAL) BY MOUTH DAILY. 11/01/16  Yes Minna Merritts, MD  aspirin EC 81 MG tablet Take 81 mg by mouth at bedtime.   Yes Historical Provider, MD  budesonide (RHINOCORT AQUA) 32 MCG/ACT nasal spray Place 1 spray into the nose daily as needed (for allergies.).    Yes Historical Provider, MD  cyclobenzaprine (FLEXERIL) 10 MG tablet TAKE 1 TABLET BY MOUTH 3 TIMES A DAY 06/01/16  Yes Satoya Feeley Maceo Pro., MD  dutasteride (AVODART) 0.5 MG capsule Take 1 capsule (0.5 mg total) by mouth daily. 08/04/16  Yes Marcell Pfeifer Maceo Pro., MD  esomeprazole (NEXIUM) 40 MG capsule TAKE 1 CAPSULE TWICE DAILY 03/17/16  Yes Jerrol Banana., MD  fexofenadine (ALLEGRA) 180 MG tablet Take 180 mg by mouth daily.     Yes Historical Provider, MD  furosemide (LASIX) 40 MG tablet Take 1 tablet (40 mg total) by mouth 2 (two) times daily as needed. Patient taking differently: Take 40 mg by mouth daily.  03/19/16  Yes Minna Merritts, MD  gabapentin (NEURONTIN) 800 MG tablet TAKE 1 TABLET 3 TIMES A DAY Patient taking differently: TAKE 2 TABLET (1600 MG) 2 TIMES A DAY 11/19/16  Yes Aristotle Lieb Maceo Pro., MD  glucose blood (ONE TOUCH ULTRA TEST) test strip Check sugar twice daily. Dx- E11.8 Patient taking differently: 1 each by Other route daily.  Dx- E11.8 10/25/16  Yes Franceska Strahm L Cranford Mon., MD  hydrochlorothiazide (HYDRODIURIL) 12.5 MG tablet Take 2 tablets (25 mg total) by mouth at bedtime. Patient taking differently: Take 12.5 mg by mouth daily.  02/09/16  Yes Amry Cathy Maceo Pro., MD  Insulin Pen Needle (PEN NEEDLES) 31G X 5 MM MISC 1 each by Does not apply route daily. PATIENT NEEDS NOVA TWIST NEEDLES 5 MM  DX E11.9 10/18/16  Yes Toleen Lachapelle Maceo Pro., MD  LANTUS SOLOSTAR 100 UNIT/ML Solostar Pen Inject 22 Units into the skin daily at 10 pm.  02/09/16  Yes Historical Provider, MD  LANTUS SOLOSTAR 100 UNIT/ML Solostar Pen INJECT 22 UNITS TWO TIMES ADAY Patient taking differently: INJECT 30 UNITS TWO TIMES ADAY 12/21/16  Yes Dayln Tugwell Maceo Pro., MD  magnesium oxide (MAG-OX) 400 (241.3 Mg) MG tablet TAKE 1 TABLET (400 MG TOTAL) BY MOUTH 2 (TWO) TIMES DAILY. 12/24/16  Yes Trinna Post, PA-C  metFORMIN (GLUCOPHAGE) 1000 MG tablet TAKE 1 TABLET (1,000 MG TOTAL) BY MOUTH 2 (TWO) TIMES DAILY. 11/24/15  Yes Historical Provider, MD  metoprolol (LOPRESSOR) 100 MG tablet Take 1 tablet (100 mg total) by mouth 2 (two) times daily. 02/09/16  Yes Leomia Blake Maceo Pro., MD  mometasone (ELOCON) 0.1 % cream Apply 1 application topically daily. For 2 weeks Patient taking differently: Apply 1 application topically daily  as needed (for ezcema flare up behind ears.).  03/02/16  Yes Quanisha Drewry Maceo Pro., MD  Multiple Vitamin (MULTIVITAMIN WITH MINERALS) TABS tablet Take 1 tablet by mouth daily. Centrum Silver.   Yes Historical Provider, MD  NOVOLOG FLEXPEN 100 UNIT/ML FlexPen INJECT 6 UNITS 3 TIMES A   DAY Patient taking differently: INJECT 12 UNITS 3 TIMES A   DAY 12/21/16  Yes Nivin Braniff Maceo Pro., MD  Omega-3 Fatty Acids (FISH OIL) 1200 MG CAPS Take 1,200 mg by mouth 2 (two) times daily.   Yes Historical Provider, MD  oxyCODONE (OXYCONTIN) 40 mg 12 hr tablet Take 1 tablet (40 mg total) by mouth every 8 (eight) hours as needed. 06/10/16  Yes Adonus Uselman Maceo Pro., MD  pregabalin (LYRICA) 50 MG capsule Take 1 capsule (50 mg total) by mouth 3 (three) times daily. 08/18/16  Yes Thessaly Mccullers Maceo Pro., MD  ranitidine (ZANTAC) 300 MG tablet Take 300 mg by mouth daily.   Yes Historical Provider, MD  sitaGLIPtin (JANUVIA) 100 MG tablet Take 100 mg by mouth daily.   Yes Historical Provider, MD  testosterone cypionate (DEPOTESTOSTERONE CYPIONATE) 200 MG/ML injection Inject 200 mg into the muscle every 21 ( twenty-one) days.    Yes Historical Provider, MD  VICTOZA 18 MG/3ML SOPN INJECT 0.3ML (=1.8MG ) INTO THE SKIN DAILY 12/21/16  Yes Jerrol Banana., MD    Patient Active Problem List   Diagnosis Date Noted  . Neuropathy ([place]) 10/13/2016  . Injury of kidney 01/21/2016  . Amblyopia 12/30/2015  . 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 NS (nuclear sclerosis) 12/30/2015  . Pseudoaphakia 12/30/2015  . CVA (cerebral infarction) 09/08/2015  . Dehydration 09/08/2015  . Aspiration pneumonia ([place]) 07/04/2015  . Paroxysmal atrial fibrillation ([place]) 07/04/2015  . Arthritis of knee, degenerative 05/28/2015  . Chronic back pain 09/17/2014  . Leg edema 05/07/2014  . Chronic diastolic CHF (congestive heart failure) ([place]) 05/07/2014  . Campylobacter diarrhea 04/25/2013  . Atrial flutter ([place]) 01/23/2013  . Obesity 05/19/2012  . Hyperlipidemia 11/11/2011  . Diastolic dysfunction 0000000  . SOB (shortness of breath) 04/12/2011  . Diabetes mellitus type 2, uncontrolled, with complications ([place]) [id number]  . HYPERTENSION, BENIGN 03/15/2011  . DVT 03/15/2011  . TACHYCARDIA 03/15/2011    Past Medical History:  Diagnosis Date  . Arrhythmia    tachycardia, A-Fib  . Arthritis   . Blind right eye   . BPH (benign prostatic hyperplasia)   . Diabetes mellitus   . DVT (deep venous thrombosis) ([place]) 02/2010  . Dysrhythmia   . 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 GERD (gastroesophageal reflux disease)   . Hypercholesteremia   . Hypertension   .  Kidney failure   . Neuropathy ([place])   . Pneumonia    time 9 ;last episode 12/2015  . Pulmonary embolus ([place]) 2011  . Seasonal allergies   . Seizures ([place])    as child   . Sleep apnea    BIPAP  . Stiff neck    limited turning s/p titanium plate placement  . TIA (transient ischemic attack)   . Wears dentures    full upper and lower    Social History   Social History  . Marital status: Married    Spouse name: N/A  . Number of children: N/A  . Years of education: N/A   Occupational History  . Not on file.   Social History Main Topics  . Smoking status: Former Smoker  Packs/day: 2.00    Years: 20.00    Types: Cigarettes    Quit date: 12/26/1989  . Smokeless tobacco: Never Used  . Alcohol use No  . Drug use: No  . Sexual activity: Not on file   Other Topics Concern  . Not on file   Social History Narrative  . No narrative on file    Allergies  Allergen Reactions  . Carisoprodol Itching    Review of Systems  Constitutional: Negative.   HENT: Negative.   Eyes: Negative.   Respiratory: Negative.   Cardiovascular: Negative.   Gastrointestinal: Negative.   Genitourinary: Negative.   Musculoskeletal: Positive for back pain and joint pain.  Skin: Negative.   Neurological: Negative.   Endo/Heme/Allergies: Negative.   Psychiatric/Behavioral: Negative.     Immunization History  Administered Date(s) Administered  . Influenza, High Dose Seasonal PF 09/18/2015, 10/13/2016  . Influenza-Unspecified 08/27/2014    Objective:  BP (!) 152/60 (BP Location: Left Arm, Patient Position: Sitting, Cuff Size: Large)   Pulse 74   Temp 98.1 F (36.7 C) (Oral)   Resp 16   Wt 264 lb (119.7 kg)   BMI 36.82 kg/m   Physical Exam  Constitutional: He is oriented to person, place, and time and well-developed, well-nourished, and in no distress.  HENT:  Head: Normocephalic and atraumatic.  Right Ear: External ear normal.  Left Ear: External ear normal.  Nose: Nose normal.   Eyes: Conjunctivae and EOM are normal. Pupils are equal, round, and reactive to light.  Neck: Normal range of motion. Neck supple.  Cardiovascular: Normal rate, regular rhythm, normal heart sounds and intact distal pulses.   Pulmonary/Chest: Effort normal and breath sounds normal.  Abdominal: Soft.  Musculoskeletal: Normal range of motion.  Neurological: He is alert and oriented to person, place, and time. He has normal reflexes. Gait normal. GCS score is 15.  Skin: Skin is warm and dry.  Psychiatric: Mood, memory, affect and judgment normal.    Lab Results  Component Value Date   WBC 7.2 11/29/2016   HGB 13.1 11/29/2016   HCT 41.1 11/29/2016   PLT 148 (L) 11/29/2016   GLUCOSE 177 (H) 11/29/2016   CHOL 89 (L) 12/11/2015   TRIG 298 (H) 12/11/2015   HDL 26 (L) 12/11/2015   LDLCALC 3 12/11/2015   TSH 2.21 04/03/2013   INR 1.03 11/29/2016   HGBA1C 8.5 (H) 11/29/2016    CMP     Component Value Date/Time   NA 140 11/29/2016 1402   NA 142 12/11/2015 1014   NA 137 04/12/2013 1150   K 4.2 11/29/2016 1402   K 4.0 04/12/2013 1150   CL 101 11/29/2016 1402   CL 104 04/12/2013 1150   CO2 30 11/29/2016 1402   CO2 30 04/12/2013 1150   GLUCOSE 177 (H) 11/29/2016 1402   GLUCOSE 146 (H) 04/12/2013 1150   BUN 20 11/29/2016 1402   BUN 24 12/11/2015 1014   BUN 11 04/12/2013 1150   CREATININE 1.06 11/29/2016 1402   CREATININE 0.98 04/12/2013 1150   CALCIUM 9.5 11/29/2016 1402   CALCIUM 8.8 04/12/2013 1150   PROT 7.3 11/29/2016 1402   PROT 6.5 12/11/2015 1014   PROT 6.9 04/12/2013 1150   ALBUMIN 4.3 11/29/2016 1402   ALBUMIN 4.3 12/11/2015 1014   ALBUMIN 3.3 (L) 04/12/2013 1150   AST 35 11/29/2016 1402   AST 22 04/12/2013 1150   ALT 40 11/29/2016 1402   ALT 29 04/12/2013 1150   ALKPHOS 96 11/29/2016 1402  ALKPHOS 99 04/12/2013 1150   BILITOT 0.6 11/29/2016 1402   BILITOT 0.5 12/11/2015 1014   BILITOT 0.5 04/12/2013 1150   GFRNONAA >60 11/29/2016 1402   GFRNONAA >60  04/12/2013 1150   GFRAA >60 11/29/2016 1402   GFRAA >60 04/12/2013 1150    Assessment and Plan :  1. Uncontrolled type 2 diabetes mellitus with complication, with long-term current use of insulin (HCC) Lantus 35 units and 14 units for Novolog. Goal to get his A1c under 7.0 she he can have knee replacement. Follow up after March 4th.   2. Chronic back pain, unspecified back location, unspecified back pain laterality  - oxyCODONE (OXYCONTIN) 40 mg 12 hr tablet; Take 1 tablet (40 mg total) by mouth every 8 (eight) hours as needed.  Dispense: 270 tablet; Refill: 0 3. Known sleep apnea Patient uses CPAP nightly HPI, Exam, and A&P Transcribed under the direction and in the presence of Kirby Argueta L. Cranford Mon, MD  Electronically Signed: Katina Dung, CMA I have done the exam and reviewed the above chart and it is accurate to the best of my knowledge. Development worker, community has been used in this note in any air is in the dictation or transcription are unintentional. . [facility] 01/17/2017 1:48 PM

## 2017-01-19 ENCOUNTER — Other Ambulatory Visit: Payer: Self-pay | Admitting: Family Medicine

## 2017-01-19 DIAGNOSIS — G629 Polyneuropathy, unspecified: Secondary | ICD-10-CM

## 2017-01-19 NOTE — Telephone Encounter (Signed)
Pt contacted office for refill request on the following medications:  CB# [phone]  These need to be sent to [facility] mail order:  pregabalin (LYRICA) 50 MG capsule  esomeprazole (NEXIUM) 40 MG capsule  sitaGLIPtin (JANUVIA) 100 MG tablet  These need to be sent to [facility].  ranitidine (ZANTAC) 300 MG tablet  metoprolol (LOPRESSOR) 100 MG tablet  hydrochlorothiazide (HYDRODIURIL) 12.5 MG tablet  magnesium oxide (MAG-OX) 400 (241.3 Mg) MG tablet

## 2017-01-19 NOTE — Telephone Encounter (Signed)
Can we send medications into the pharmacy? Please advise. Thanks!

## 2017-01-20 MED ORDER — ESOMEPRAZOLE MAGNESIUM 40 MG PO CPDR: 40.0000 mg | DELAYED_RELEASE_CAPSULE | Freq: Two times a day (BID) | ORAL | 3 refills | Status: DC

## 2017-01-20 MED ORDER — SITAGLIPTIN PHOSPHATE 100 MG PO TABS: 100.0000 mg | ORAL_TABLET | Freq: Every day | ORAL | 3 refills | Status: DC

## 2017-01-20 MED ORDER — PREGABALIN 50 MG PO CAPS: 50.0000 mg | ORAL_CAPSULE | Freq: Three times a day (TID) | ORAL | 1 refills | Status: DC

## 2017-01-26 ENCOUNTER — Other Ambulatory Visit: Payer: Self-pay | Admitting: Family Medicine

## 2017-02-05 ENCOUNTER — Emergency Department
Admission: EM | Admit: 2017-02-05 | Discharge: 2017-02-06 | Disposition: A | Discharge status: Home / Self Care | Payer: Medicare Other | Attending: Emergency Medicine | Admitting: Emergency Medicine

## 2017-02-05 DIAGNOSIS — T383X1A Poisoning by insulin and oral hypoglycemic [antidiabetic] drugs, accidental (unintentional), initial encounter: Secondary | ICD-10-CM | POA: Diagnosis not present

## 2017-02-05 DIAGNOSIS — E119 Type 2 diabetes mellitus without complications: Secondary | ICD-10-CM | POA: Diagnosis not present

## 2017-02-05 DIAGNOSIS — Z79899 Other long term (current) drug therapy: Secondary | ICD-10-CM | POA: Insufficient documentation

## 2017-02-05 DIAGNOSIS — Z794 Long term (current) use of insulin: Secondary | ICD-10-CM | POA: Diagnosis not present

## 2017-02-05 DIAGNOSIS — I10 Essential (primary) hypertension: Secondary | ICD-10-CM | POA: Insufficient documentation

## 2017-02-05 DIAGNOSIS — Z87891 Personal history of nicotine dependence: Secondary | ICD-10-CM | POA: Diagnosis not present

## 2017-02-05 DIAGNOSIS — Z7982 Long term (current) use of aspirin: Secondary | ICD-10-CM | POA: Insufficient documentation

## 2017-02-05 LAB — GLUCOSE, CAPILLARY
Glucose-Capillary: 110 mg/dL — ABNORMAL HIGH (ref 65–99)
Glucose-Capillary: 112 mg/dL — ABNORMAL HIGH (ref 65–99)
Glucose-Capillary: 122 mg/dL — ABNORMAL HIGH (ref 65–99)
Glucose-Capillary: 79 mg/dL (ref 65–99)
Glucose-Capillary: 81 mg/dL (ref 65–99)

## 2017-02-05 NOTE — ED Notes (Signed)
Pt given 8oz OJ, and peanut butter due to decrease in CBG. Dr. Archie Balboa made aware, states recheck in 15 mins, if CBG < 70, give 1 amp D50.

## 2017-02-05 NOTE — Discharge Instructions (Signed)
Please seek medical attention for any high fevers, chest pain, shortness of breath, change in behavior, persistent vomiting, bloody stool or any other new or concerning symptoms.  

## 2017-02-05 NOTE — ED Provider Notes (Signed)
[facility] Emergency Department Provider Note   ____________________________________________   I have reviewed the triage vital signs and the nursing notes.   HISTORY  Chief Complaint Drug Overdose   History limited by: Not Limited   HPI Jerry Parsons is a 66 y.o. male who presents to the emergency department today because of concerns for overdose. The patient states that he took the wrong amount of his shortacting insulin. He was supposed to take 35 units of his long acting, but used the wrong syringe. He realized shortly what he had done. States that since that time he has had a little bit of stomach upset.    Past Medical History:  Diagnosis Date  . Arrhythmia    tachycardia, A-Fib  . Arthritis   . Blind right eye   . BPH (benign prostatic hyperplasia)   . Diabetes mellitus   . DVT (deep venous thrombosis) ([place]) 02/2010  . Dysrhythmia   . 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 GERD (gastroesophageal reflux disease)   . Hypercholesteremia   . Hypertension   . Kidney failure   . Neuropathy ([place])   . Pneumonia    time 9 ;last episode 12/2015  . Pulmonary embolus ([place]) 2011  . Seasonal allergies   . Seizures ([place])    as child   . Sleep apnea    BIPAP  . Stiff neck    limited turning s/p titanium plate placement  . TIA (transient ischemic attack)   . Wears dentures    full upper and lower    Patient Active Problem List   Diagnosis Date Noted  . Neuropathy ([place]) 10/13/2016  . Injury of kidney 01/21/2016  . Amblyopia 12/30/2015  . 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 NS (nuclear sclerosis) 12/30/2015  . Pseudoaphakia 12/30/2015  . CVA (cerebral infarction) 09/08/2015  . Dehydration 09/08/2015  . Aspiration pneumonia ([place]) 07/04/2015  . Paroxysmal atrial fibrillation ([place]) 07/04/2015  . Arthritis of knee, degenerative 05/28/2015  . Chronic back pain 09/17/2014  . Leg edema 05/07/2014  . Chronic diastolic CHF (congestive heart  failure) ([place]) 05/07/2014  . Campylobacter diarrhea 04/25/2013  . Atrial flutter ([place]) 01/23/2013  . Obesity 05/19/2012  . Hyperlipidemia 11/11/2011  . Diastolic dysfunction 0000000  . SOB (shortness of breath) 04/12/2011  . Diabetes mellitus type 2, uncontrolled, with complications ([place]) [id number]  . HYPERTENSION, BENIGN 03/15/2011  . DVT 03/15/2011  . TACHYCARDIA 03/15/2011    Past Surgical History:  Procedure Laterality Date  . APPENDECTOMY    . BACK SURGERY     x 8; upper x 3 & lower x 5  . CARDIOVERSION  03/14/13, 10/16   2014 - [place], 2016 - [place]  . CATARACT EXTRACTION W/PHACO Left 10/29/2015   Procedure: CATARACT EXTRACTION PHACO AND INTRAOCULAR LENS PLACEMENT (IOC);  Surgeon: Leandrew Koyanagi, MD;  Location: [facility];  Service: Ophthalmology;  Laterality: Left;  DIABETIC - insulin and oral meds Sleep apnea - no machine  . CHOLECYSTECTOMY    . EYE SURGERY    . GALLBLADDER SURGERY  2002  . KNEE ARTHROSCOPY     left   . ROTATOR CUFF REPAIR  2001   left   . TONSILLECTOMY      Prior to Admission medications   Medication Sig Start Date End Date Taking? Authorizing Provider  acetaminophen (TYLENOL) 500 MG tablet Take 500 mg by mouth every 6 (six) hours as needed (for headache.).    Historical Provider, MD  amiodarone (  PACERONE) 200 MG tablet TAKE 1 TABLET (200 MG TOTAL) BY MOUTH 2 (TWO) TIMES DAILY. Patient taking differently: TAKE 1 TABLET (200 MG TOTAL) BY MOUTH DAILY. 11/01/16   Minna Merritts, MD  aspirin EC 81 MG tablet Take 81 mg by mouth at bedtime.    Historical Provider, MD  budesonide (RHINOCORT AQUA) 32 MCG/ACT nasal spray Place 1 spray into the nose daily as needed (for allergies.).     Historical Provider, MD  cyclobenzaprine (FLEXERIL) 10 MG tablet TAKE 1 TABLET BY MOUTH 3 TIMES A DAY 06/01/16   Richard Maceo Pro., MD  dutasteride (AVODART) 0.5 MG capsule Take 1 capsule (0.5 mg total) by mouth daily. 08/04/16   Richard Maceo Pro., MD   esomeprazole (NEXIUM) 40 MG capsule Take 1 capsule (40 mg total) by mouth 2 (two) times daily. 01/20/17   Richard Maceo Pro., MD  fexofenadine (ALLEGRA) 180 MG tablet Take 180 mg by mouth daily.      Historical Provider, MD  furosemide (LASIX) 40 MG tablet Take 1 tablet (40 mg total) by mouth 2 (two) times daily as needed. Patient taking differently: Take 40 mg by mouth daily.  03/19/16   Minna Merritts, MD  gabapentin (NEURONTIN) 800 MG tablet TAKE 1 TABLET 3 TIMES A DAY Patient taking differently: TAKE 2 TABLET (1600 MG) 2 TIMES A DAY 11/19/16   Richard Maceo Pro., MD  glucose blood (ONE TOUCH ULTRA TEST) test strip Check sugar twice daily. Dx- E11.8 Patient taking differently: 1 each by Other route daily. Dx- E11.8 10/25/16   Jerrol Banana., MD  hydrochlorothiazide (HYDRODIURIL) 12.5 MG tablet Take 2 tablets (25 mg total) by mouth at bedtime. Patient taking differently: Take 12.5 mg by mouth daily.  02/09/16   Richard Maceo Pro., MD  Insulin Pen Needle (PEN NEEDLES) 31G X 5 MM MISC 1 each by Does not apply route daily. PATIENT NEEDS NOVA TWIST NEEDLES 5 MM  DX E11.9 10/18/16   Richard Maceo Pro., MD  LANTUS SOLOSTAR 100 UNIT/ML Solostar Pen Inject 22 Units into the skin daily at 10 pm.  02/09/16   Historical Provider, MD  LANTUS SOLOSTAR 100 UNIT/ML Solostar Pen INJECT 22 UNITS TWO TIMES ADAY Patient taking differently: INJECT 30 UNITS TWO TIMES ADAY 12/21/16   Jerrol Banana., MD  magnesium oxide (MAG-OX) 400 (241.3 Mg) MG tablet TAKE 1 TABLET (400 MG TOTAL) BY MOUTH 2 (TWO) TIMES DAILY. 12/24/16   Trinna Post, PA-C  magnesium oxide (MAG-OX) 400 (241.3 Mg) MG tablet TAKE 1 TABLET (400 MG TOTAL) BY MOUTH 2 (TWO) TIMES DAILY. 01/27/17   Jerrol Banana., MD  metFORMIN (GLUCOPHAGE) 1000 MG tablet TAKE 1 TABLET (1,000 MG TOTAL) BY MOUTH 2 (TWO) TIMES DAILY. 11/24/15   Historical Provider, MD  metoprolol (LOPRESSOR) 100 MG tablet Take 1 tablet (100 mg total) by mouth  2 (two) times daily. 02/09/16   Richard Maceo Pro., MD  mometasone (ELOCON) 0.1 % cream Apply 1 application topically daily. For 2 weeks Patient taking differently: Apply 1 application topically daily as needed (for ezcema flare up behind ears.).  03/02/16   Jerrol Banana., MD  Multiple Vitamin (MULTIVITAMIN WITH MINERALS) TABS tablet Take 1 tablet by mouth daily. Centrum Silver.    Historical Provider, MD  NOVOLOG FLEXPEN 100 UNIT/ML FlexPen INJECT 6 UNITS 3 TIMES A   DAY Patient taking differently: INJECT 12 UNITS 3 TIMES A   DAY 12/21/16  Richard Maceo Pro., MD  Omega-3 Fatty Acids (FISH OIL) 1200 MG CAPS Take 1,200 mg by mouth 2 (two) times daily.    Historical Provider, MD  oxyCODONE (OXYCONTIN) 40 mg 12 hr tablet Take 1 tablet (40 mg total) by mouth every 8 (eight) hours as needed. 01/17/17   Richard Maceo Pro., MD  pregabalin (LYRICA) 50 MG capsule Take 1 capsule (50 mg total) by mouth 3 (three) times daily. 01/20/17   Richard Maceo Pro., MD  ranitidine (ZANTAC) 300 MG tablet Take 300 mg by mouth daily.    Historical Provider, MD  sitaGLIPtin (JANUVIA) 100 MG tablet Take 1 tablet (100 mg total) by mouth daily. 01/20/17   Richard Maceo Pro., MD  testosterone cypionate (DEPOTESTOSTERONE CYPIONATE) 200 MG/ML injection Inject 200 mg into the muscle every 21 ( twenty-one) days.     Historical Provider, MD  VICTOZA 18 MG/3ML SOPN INJECT 0.3ML (=1.8MG ) INTO THE SKIN DAILY 12/21/16   Jerrol Banana., MD    Allergies Carisoprodol  Family History  Problem Relation Age of Onset  . Heart attack Brother     Social History Social History  Substance Use Topics  . Smoking status: Former Smoker    Packs/day: 2.00    Years: 20.00    Types: Cigarettes    Quit date: 12/26/1989  . Smokeless tobacco: Never Used  . Alcohol use No    Review of Systems  Constitutional: Negative for fever. Cardiovascular: Negative for chest pain. Respiratory: Negative for shortness of  breath. Gastrointestinal: Positive for abdominal discomfort.  Genitourinary: Negative for dysuria. Musculoskeletal: Negative for back pain. Skin: Negative for rash. Neurological: Negative for headaches, focal weakness or numbness.  10-point ROS otherwise negative.  ____________________________________________   PHYSICAL EXAM:  VITAL SIGNS: ED Triage Vitals  Enc Vitals Group     BP 02/05/17 2130 (!) 166/75     Pulse Rate 02/05/17 2130 88     Resp 02/05/17 2130 14     Temp 02/05/17 2130 98.5 F (36.9 C)     Temp src --      SpO2 02/05/17 2130 96 %     Weight 02/05/17 2130 255 lb (115.7 kg)     Height 02/05/17 2130 5\' 11"  (1.803 m)     Head Circumference --      Peak Flow --      Pain Score 02/05/17 2131 8     Pain Loc --      Pain Edu? --      Excl. in [place]? --      Constitutional: Alert and oriented. Well appearing and in no distress. Eyes: Conjunctivae are normal. Normal extraocular movements. ENT   Head: Normocephalic and atraumatic.   Nose: No congestion/rhinnorhea.   Mouth/Throat: Mucous membranes are moist.   Neck: No stridor. Hematological/Lymphatic/Immunilogical: No cervical lymphadenopathy. Cardiovascular: Normal rate, regular rhythm.  No murmurs, rubs, or gallops.  Respiratory: Normal respiratory effort without tachypnea nor retractions. Breath sounds are clear and equal bilaterally. No wheezes/rales/rhonchi. Gastrointestinal: Soft and non tender. No rebound. No guarding.  Genitourinary: Deferred Musculoskeletal: Normal range of motion in all extremities. No lower extremity edema. Neurologic:  Normal speech and language. No gross focal neurologic deficits are appreciated.  Skin:  Skin is warm, dry and intact. No rash noted. Psychiatric: Mood and affect are normal. Speech and behavior are normal. Patient exhibits appropriate insight and judgment.  ____________________________________________    LABS (pertinent positives/negatives)  Labs  Reviewed  GLUCOSE, CAPILLARY - Abnormal; Notable  for the following:       Result Value   Glucose-Capillary 110 (*)    All other components within normal limits  GLUCOSE, CAPILLARY - Abnormal; Notable for the following:    Glucose-Capillary 112 (*)    All other components within normal limits  GLUCOSE, CAPILLARY  CBG MONITORING, ED  CBG MONITORING, ED     ____________________________________________   EKG  None  ____________________________________________    RADIOLOGY  None  ____________________________________________   PROCEDURES  Procedures  ____________________________________________   INITIAL IMPRESSION / ASSESSMENT AND PLAN / ED COURSE  Pertinent labs & imaging results that were available during my care of the patient were reviewed by me and considered in my medical decision making (see chart for details).  Patient presented to the emergency department today after accidentally overdosing on his short acting insulin. Patient states his baseline sugars around 200. Patient will be observed in the emergency department for a number of hours with serial blood glucose checks.  ____________________________________________   FINAL CLINICAL IMPRESSION(S) / ED DIAGNOSES  Final diagnoses:  Insulin overdose, accidental or unintentional, initial encounter     Note: This dictation was prepared with Dragon dictation. Any transcriptional errors that result from this process are unintentional     Nance Pear, MD 02/05/17 [phone]

## 2017-02-05 NOTE — ED Triage Notes (Signed)
Pt states accidentally took 35 units of aspart insulin at 2100. Pt states he normally takes 14 units. Pt states blood sugar before injection was 263 and in triage is 125. Pt states "i feel sick on my stomach". Pt denies other symptoms of hypoglycemia. Alert, skin pwd.

## 2017-02-05 NOTE — ED Notes (Signed)
Pt eating meal tray and drinking juice.

## 2017-02-06 LAB — GLUCOSE, CAPILLARY
Glucose-Capillary: 112 mg/dL — ABNORMAL HIGH (ref 65–99)
Glucose-Capillary: 114 mg/dL — ABNORMAL HIGH (ref 65–99)
Glucose-Capillary: 137 mg/dL — ABNORMAL HIGH (ref 65–99)
Glucose-Capillary: 143 mg/dL — ABNORMAL HIGH (ref 65–99)
Glucose-Capillary: 167 mg/dL — ABNORMAL HIGH (ref 65–99)
Glucose-Capillary: 184 mg/dL — ABNORMAL HIGH (ref 65–99)

## 2017-02-06 NOTE — ED Provider Notes (Signed)
-----------------------------------------   3:11 AM on 02/06/2017 -----------------------------------------   Blood pressure (!) 144/62, pulse 77, temperature 98.5 F (36.9 C), resp. rate 19, height 5\' 11"  (1.803 m), weight 255 lb (115.7 kg), SpO2 98 %.  Assuming care from Dr. Archie Balboa.  In short, Jerry Parsons is a 66 y.o. male with a chief complaint of Drug Overdose .  Refer to the original H&P for additional details.  The current plan of care is to monitor the patient's blood glucose levels prior to discharging him home.  We did monitor the patient's blood sugar levels and after his low of 79 his blood sugars have been elevated. They have been 112, 137, 143, 167 and 184. The patient is been eating and feels well. He'll be discharged home to follow-up with his primary care physician.        Loney Hering, MD 02/06/17 [phone]

## 2017-02-07 LAB — GLUCOSE, CAPILLARY: Glucose-Capillary: 126 mg/dL — ABNORMAL HIGH (ref 65–99)

## 2017-02-18 DIAGNOSIS — Z86010 Personal history of colonic polyps: Secondary | ICD-10-CM | POA: Diagnosis not present

## 2017-02-21 ENCOUNTER — Other Ambulatory Visit: Payer: Self-pay | Admitting: Family Medicine

## 2017-02-21 DIAGNOSIS — N401 Enlarged prostate with lower urinary tract symptoms: Secondary | ICD-10-CM | POA: Diagnosis not present

## 2017-02-21 DIAGNOSIS — E291 Testicular hypofunction: Secondary | ICD-10-CM | POA: Diagnosis not present

## 2017-02-21 DIAGNOSIS — N5201 Erectile dysfunction due to arterial insufficiency: Secondary | ICD-10-CM | POA: Diagnosis not present

## 2017-02-26 ENCOUNTER — Other Ambulatory Visit: Payer: Self-pay | Admitting: Family Medicine

## 2017-03-04 DIAGNOSIS — M47816 Spondylosis without myelopathy or radiculopathy, lumbar region: Secondary | ICD-10-CM | POA: Diagnosis not present

## 2017-03-07 ENCOUNTER — Ambulatory Visit: Payer: Medicare Other | Admitting: Family Medicine

## 2017-03-08 ENCOUNTER — Ambulatory Visit (INDEPENDENT_AMBULATORY_CARE_PROVIDER_SITE_OTHER): Payer: Medicare Other | Admitting: Family Medicine

## 2017-03-08 ENCOUNTER — Encounter: Payer: Self-pay | Admitting: Family Medicine

## 2017-03-08 VITALS — BP 130/62 | HR 70 | Temp 98.1°F | Resp 16 | Wt 276.0 lb

## 2017-03-08 DIAGNOSIS — E118 Type 2 diabetes mellitus with unspecified complications: Secondary | ICD-10-CM

## 2017-03-08 DIAGNOSIS — M549 Dorsalgia, unspecified: Secondary | ICD-10-CM

## 2017-03-08 DIAGNOSIS — I10 Essential (primary) hypertension: Secondary | ICD-10-CM | POA: Diagnosis not present

## 2017-03-08 DIAGNOSIS — Z794 Long term (current) use of insulin: Secondary | ICD-10-CM

## 2017-03-08 DIAGNOSIS — E1165 Type 2 diabetes mellitus with hyperglycemia: Secondary | ICD-10-CM | POA: Diagnosis not present

## 2017-03-08 DIAGNOSIS — G8929 Other chronic pain: Secondary | ICD-10-CM | POA: Diagnosis not present

## 2017-03-08 DIAGNOSIS — E785 Hyperlipidemia, unspecified: Secondary | ICD-10-CM

## 2017-03-08 DIAGNOSIS — IMO0002 Reserved for concepts with insufficient information to code with codable children: Secondary | ICD-10-CM

## 2017-03-08 LAB — POCT GLYCOSYLATED HEMOGLOBIN (HGB A1C): Hemoglobin A1C: 6.6

## 2017-03-08 MED ORDER — OXYCODONE HCL ER 40 MG PO T12A: 40.0000 mg | EXTENDED_RELEASE_TABLET | Freq: Three times a day (TID) | ORAL | 0 refills | Status: DC | PRN

## 2017-03-08 NOTE — Progress Notes (Signed)
Subjective:  HPI  Diabetes Mellitus Type II, Follow-up:   Lab Results  Component Value Date   HGBA1C 8.5 (H) 11/29/2016   HGBA1C 8.1 10/13/2016   HGBA1C 7.8 07/12/2016    Last seen for diabetes 4 months ago.  Management since then includes increase Lantus to 35 units and Novolog to 14 units. He reports good compliance with treatment. He is not having side effects.  Home blood sugar records: up and down. Pt reports that when he eats "junk" the day or night before his blood sugars are better but when he does not they are higher.   Episodes of hypoglycemia   Current Insulin Regimen: Lantus to 35 units and Novolog to 14 units.  Pertinent Labs:    Component Value Date/Time   CHOL 89 (L) 12/11/2015 1014   CHOL 93 04/03/2013 0209   TRIG 298 (H) 12/11/2015 1014   TRIG 163 04/03/2013 0209   HDL 26 (L) 12/11/2015 1014   HDL 25 (L) 04/03/2013 0209   LDLCALC 3 12/11/2015 1014   LDLCALC 35 04/03/2013 0209   CREATININE 1.06 11/29/2016 1402   CREATININE 0.98 04/12/2013 1150    Wt Readings from Last 3 Encounters:  03/08/17 276 lb (125.2 kg)  02/05/17 255 lb (115.7 kg)  01/17/17 264 lb (119.7 kg)    ------------------------------------------------------------------------ He had a recent ER visit on 02/05/17. Pt had recent accidental insulin overdose. He reports that he felt fine, but he used the wrong syringe to administer his insulin.  He needs refill on Oxycotin.    Prior to Admission medications   Medication Sig Start Date End Date Taking? Authorizing Provider  acetaminophen (TYLENOL) 500 MG tablet Take 500 mg by mouth every 6 (six) hours as needed (for headache.).    Historical Provider, MD  amiodarone (PACERONE) 200 MG tablet TAKE 1 TABLET (200 MG TOTAL) BY MOUTH 2 (TWO) TIMES DAILY. Patient taking differently: TAKE 1 TABLET (200 MG TOTAL) BY MOUTH DAILY. 11/01/16   Minna Merritts, MD  aspirin EC 81 MG tablet Take 81 mg by mouth at bedtime.    Historical Provider, MD    budesonide (RHINOCORT AQUA) 32 MCG/ACT nasal spray Place 1 spray into the nose daily as needed (for allergies.).     Historical Provider, MD  cyclobenzaprine (FLEXERIL) 10 MG tablet TAKE 1 TABLET BY MOUTH 3 TIMES A DAY 06/01/16   Auna Mikkelsen Maceo Pro., MD  dutasteride (AVODART) 0.5 MG capsule Take 1 capsule (0.5 mg total) by mouth daily. 08/04/16   Tao Satz Maceo Pro., MD  esomeprazole (NEXIUM) 40 MG capsule Take 1 capsule (40 mg total) by mouth 2 (two) times daily. 01/20/17   Barclay Lennox Maceo Pro., MD  fexofenadine (ALLEGRA) 180 MG tablet Take 180 mg by mouth daily.      Historical Provider, MD  furosemide (LASIX) 40 MG tablet Take 1 tablet (40 mg total) by mouth 2 (two) times daily as needed. Patient taking differently: Take 40 mg by mouth daily.  03/19/16   Minna Merritts, MD  gabapentin (NEURONTIN) 800 MG tablet TAKE 1 TABLET 3 TIMES A DAY Patient taking differently: TAKE 2 TABLET (1600 MG) 2 TIMES A DAY 11/19/16   Elsie Ducre Maceo Pro., MD  glucose blood (ONE TOUCH ULTRA TEST) test strip Check sugar twice daily. Dx- E11.8 Patient taking differently: 1 each by Other route daily. Dx- E11.8 10/25/16   Jerrol Banana., MD  hydrochlorothiazide (HYDRODIURIL) 12.5 MG tablet Take 2 tablets (25 mg total) by  mouth at bedtime. Patient taking differently: Take 12.5 mg by mouth daily.  02/09/16   Tymon Nemetz Maceo Pro., MD  Insulin Pen Needle (PEN NEEDLES) 31G X 5 MM MISC 1 each by Does not apply route daily. PATIENT NEEDS NOVA TWIST NEEDLES 5 MM  DX E11.9 10/18/16   Criston Chancellor Maceo Pro., MD  LANTUS SOLOSTAR 100 UNIT/ML Solostar Pen Inject 22 Units into the skin daily at 10 pm.  02/09/16   Historical Provider, MD  LANTUS SOLOSTAR 100 UNIT/ML Solostar Pen INJECT 22 UNITS TWO TIMES ADAY Patient taking differently: INJECT 30 UNITS TWO TIMES ADAY 12/21/16   Jerrol Banana., MD  magnesium oxide (MAG-OX) 400 (241.3 Mg) MG tablet TAKE 1 TABLET (400 MG TOTAL) BY MOUTH 2 (TWO) TIMES DAILY. 12/24/16    Trinna Post, PA-C  magnesium oxide (MAG-OX) 400 (241.3 Mg) MG tablet TAKE 1 TABLET (400 MG TOTAL) BY MOUTH 2 (TWO) TIMES DAILY. 01/27/17   Jerrol Banana., MD  metFORMIN (GLUCOPHAGE) 1000 MG tablet TAKE 1 TABLET (1,000 MG TOTAL) BY MOUTH 2 (TWO) TIMES DAILY. 11/24/15   Historical Provider, MD  metoprolol (LOPRESSOR) 100 MG tablet TAKE 1 TABLET (100 MG TOTAL) BY MOUTH 2 (TWO) TIMES DAILY. 02/21/17   Demiya Magno Maceo Pro., MD  mometasone (ELOCON) 0.1 % cream Apply 1 application topically daily. For 2 weeks Patient taking differently: Apply 1 application topically daily as needed (for ezcema flare up behind ears.).  03/02/16   Jerrol Banana., MD  Multiple Vitamin (MULTIVITAMIN WITH MINERALS) TABS tablet Take 1 tablet by mouth daily. Centrum Silver.    Historical Provider, MD  NOVOLOG FLEXPEN 100 UNIT/ML FlexPen INJECT 6 UNITS 3 TIMES A   DAY Patient taking differently: INJECT 12 UNITS 3 TIMES A   DAY 12/21/16   Walburga Hudman Maceo Pro., MD  Omega-3 Fatty Acids (FISH OIL) 1200 MG CAPS Take 1,200 mg by mouth 2 (two) times daily.    Historical Provider, MD  oxyCODONE (OXYCONTIN) 40 mg 12 hr tablet Take 1 tablet (40 mg total) by mouth every 8 (eight) hours as needed. 01/17/17   Ricarda Atayde Maceo Pro., MD  pregabalin (LYRICA) 50 MG capsule Take 1 capsule (50 mg total) by mouth 3 (three) times daily. 01/20/17   Billal Rollo Maceo Pro., MD  ranitidine (ZANTAC) 300 MG tablet Take 300 mg by mouth daily.    Historical Provider, MD  ranitidine (ZANTAC) 300 MG tablet TAKE 1 TABLET BY MOUTH EVERY DAY 02/28/17   Jerrol Banana., MD  sitaGLIPtin (JANUVIA) 100 MG tablet Take 1 tablet (100 mg total) by mouth daily. 01/20/17   Bexleigh Theriault Maceo Pro., MD  testosterone cypionate (DEPOTESTOSTERONE CYPIONATE) 200 MG/ML injection Inject 200 mg into the muscle every 21 ( twenty-one) days.     Historical Provider, MD  VICTOZA 18 MG/3ML SOPN INJECT 0.3ML (=1.8MG ) INTO THE SKIN DAILY 12/21/16   Jerrol Banana., MD     Patient Active Problem List   Diagnosis Date Noted  . Neuropathy ([place]) 10/13/2016  . Injury of kidney 01/21/2016  . Amblyopia 12/30/2015  . 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 NS (nuclear sclerosis) 12/30/2015  . Pseudoaphakia 12/30/2015  . CVA (cerebral infarction) 09/08/2015  . Dehydration 09/08/2015  . Aspiration pneumonia ([place]) 07/04/2015  . Paroxysmal atrial fibrillation ([place]) 07/04/2015  . Arthritis of knee, degenerative 05/28/2015  . Chronic back pain 09/17/2014  . Leg edema 05/07/2014  . Chronic diastolic CHF (congestive heart failure) ([place]) 05/07/2014  .  Campylobacter diarrhea 04/25/2013  . Atrial flutter ([place]) 01/23/2013  . Obesity 05/19/2012  . Hyperlipidemia 11/11/2011  . Diastolic dysfunction 82/50/0370  . SOB (shortness of breath) 04/12/2011  . Diabetes mellitus type 2, uncontrolled, with complications ([place]) 48/88/9169  . HYPERTENSION, BENIGN 03/15/2011  . DVT 03/15/2011  . TACHYCARDIA 03/15/2011    Past Medical History:  Diagnosis Date  . Arrhythmia    tachycardia, A-Fib  . Arthritis   . Blind right eye   . BPH (benign prostatic hyperplasia)   . Diabetes mellitus   . DVT (deep venous thrombosis) ([place]) 02/2010  . Dysrhythmia   . 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 GERD (gastroesophageal reflux disease)   . Hypercholesteremia   . Hypertension   . Kidney failure   . Neuropathy ([place])   . Pneumonia    time 9 ;last episode 12/2015  . Pulmonary embolus ([place]) 2011  . Seasonal allergies   . Seizures ([place])    as child   . Sleep apnea    BIPAP  . Stiff neck    limited turning s/p titanium plate placement  . TIA (transient ischemic attack)   . Wears dentures    full upper and lower    Social History   Social History  . Marital status: Married    Spouse name: N/A  . Number of children: N/A  . Years of education: N/A   Occupational History  . Not on file.   Social History Main Topics  . Smoking status: Former Smoker    Packs/day:  2.00    Years: 20.00    Types: Cigarettes    Quit date: 12/26/1989  . Smokeless tobacco: Never Used  . Alcohol use No  . Drug use: No  . Sexual activity: Not on file   Other Topics Concern  . Not on file   Social History Narrative  . No narrative on file    Allergies  Allergen Reactions  . Carisoprodol Itching    Review of Systems  Constitutional: Negative.   HENT: Negative.   Eyes: Negative.   Respiratory: Negative.   Cardiovascular: Negative.   Genitourinary: Negative.   Musculoskeletal: Positive for back pain.  Skin: Negative.   Neurological: Negative.   Endo/Heme/Allergies: Negative.   Psychiatric/Behavioral: Negative.     Immunization History  Administered Date(s) Administered  . Influenza, High Dose Seasonal PF 09/18/2015, 10/13/2016  . Influenza-Unspecified 08/27/2014  . Pneumococcal Conjugate-13 07/10/2014  . Pneumococcal Polysaccharide-23 12/02/1999, 12/30/2011  . Td 03/31/2005  . Zoster 06/30/2014    Objective:  BP 130/62 (BP Location: Left Arm, Patient Position: Sitting, Cuff Size: Large)   Pulse 70   Temp 98.1 F (36.7 C) (Oral)   Resp 16   Wt 276 lb (125.2 kg)   BMI 38.49 kg/m   Physical Exam  Constitutional: He is oriented to person, place, and time and well-developed, well-nourished, and in no distress.  HENT:  Head: Normocephalic and atraumatic.  Eyes: Conjunctivae and EOM are normal. Pupils are equal, round, and reactive to light.  Neck: Normal range of motion. Neck supple.  Cardiovascular: Normal rate, regular rhythm, normal heart sounds and intact distal pulses.   Pulmonary/Chest: Effort normal and breath sounds normal.  Abdominal: Soft.  Musculoskeletal: Normal range of motion.  Neurological: He is alert and oriented to person, place, and time. He has normal reflexes. Gait normal. GCS score is 15.  Skin: Skin is warm and dry.  Psychiatric: Mood, memory, affect and judgment normal.  Lab Results  Component Value Date   WBC 7.2  11/29/2016   HGB 13.1 11/29/2016   HCT 41.1 11/29/2016   PLT 148 (L) 11/29/2016   GLUCOSE 177 (H) 11/29/2016   CHOL 89 (L) 12/11/2015   TRIG 298 (H) 12/11/2015   HDL 26 (L) 12/11/2015   LDLCALC 3 12/11/2015   TSH 2.21 04/03/2013   INR 1.03 11/29/2016   HGBA1C 8.5 (H) 11/29/2016    CMP     Component Value Date/Time   NA 140 11/29/2016 1402   NA 142 12/11/2015 1014   NA 137 04/12/2013 1150   K 4.2 11/29/2016 1402   K 4.0 04/12/2013 1150   CL 101 11/29/2016 1402   CL 104 04/12/2013 1150   CO2 30 11/29/2016 1402   CO2 30 04/12/2013 1150   GLUCOSE 177 (H) 11/29/2016 1402   GLUCOSE 146 (H) 04/12/2013 1150   BUN 20 11/29/2016 1402   BUN 24 12/11/2015 1014   BUN 11 04/12/2013 1150   CREATININE 1.06 11/29/2016 1402   CREATININE 0.98 04/12/2013 1150   CALCIUM 9.5 11/29/2016 1402   CALCIUM 8.8 04/12/2013 1150   PROT 7.3 11/29/2016 1402   PROT 6.5 12/11/2015 1014   PROT 6.9 04/12/2013 1150   ALBUMIN 4.3 11/29/2016 1402   ALBUMIN 4.3 12/11/2015 1014   ALBUMIN 3.3 (L) 04/12/2013 1150   AST 35 11/29/2016 1402   AST 22 04/12/2013 1150   ALT 40 11/29/2016 1402   ALT 29 04/12/2013 1150   ALKPHOS 96 11/29/2016 1402   ALKPHOS 99 04/12/2013 1150   BILITOT 0.6 11/29/2016 1402   BILITOT 0.5 12/11/2015 1014   BILITOT 0.5 04/12/2013 1150   GFRNONAA >60 11/29/2016 1402   GFRNONAA >60 04/12/2013 1150   GFRAA >60 11/29/2016 1402   GFRAA >60 04/12/2013 1150    Assessment and Plan :  1. Uncontrolled type 2 diabetes mellitus with complication, with long-term current use of insulin ([place]) Now well controlled. - POCT HgB A1C 6.6 today. Much improved. Continue current medications diet and exercise.   2. Chronic back pain, unspecified back location, unspecified back pain laterality  - oxyCODONE (OXYCONTIN) 40 mg 12 hr tablet; Take 1 tablet (40 mg total) by mouth every 8 (eight) hours as needed.  Dispense: 270 tablet; Refill: 0  3. HYPERTENSION, BENIGN   4. Hyperlipidemia, unspecified  hyperlipidemia type 5.OA Pt cleared for TKR.  HPI, Exam, and A&P Transcribed under the direction and in the presence of Cecylia Brazill L. Cranford Mon, MD  Electronically Signed: Katina Dung, CMA I have done the exam and reviewed the above chart and it is accurate to the best of my knowledge. Development worker, community has been used in this note in any air is in the dictation or transcription are unintentional.  [facility] 03/08/2017 3:01 PM

## 2017-03-22 ENCOUNTER — Telehealth: Payer: Self-pay | Admitting: Family Medicine

## 2017-03-22 NOTE — Telephone Encounter (Signed)
Wife advised that this information was faxed over this morning and that I will let them know when insurance replies.-aa

## 2017-03-22 NOTE — Telephone Encounter (Signed)
Wife called saying insurance needs a PA for his   oxyCODONE (OXYCONTIN) 40 mg 12 hr tablet  Insurance # is [id number]  [place]

## 2017-03-22 NOTE — Telephone Encounter (Signed)
AA did it.

## 2017-03-23 ENCOUNTER — Other Ambulatory Visit: Payer: Self-pay | Admitting: Family Medicine

## 2017-03-24 ENCOUNTER — Other Ambulatory Visit: Payer: Self-pay | Admitting: Family Medicine

## 2017-03-30 DIAGNOSIS — Z79899 Other long term (current) drug therapy: Secondary | ICD-10-CM | POA: Diagnosis not present

## 2017-03-30 DIAGNOSIS — E291 Testicular hypofunction: Secondary | ICD-10-CM | POA: Diagnosis not present

## 2017-03-30 DIAGNOSIS — N5201 Erectile dysfunction due to arterial insufficiency: Secondary | ICD-10-CM | POA: Diagnosis not present

## 2017-03-30 DIAGNOSIS — N401 Enlarged prostate with lower urinary tract symptoms: Secondary | ICD-10-CM | POA: Diagnosis not present

## 2017-04-13 ENCOUNTER — Telehealth: Payer: Self-pay | Admitting: Family Medicine

## 2017-04-13 ENCOUNTER — Other Ambulatory Visit: Payer: Self-pay | Admitting: Emergency Medicine

## 2017-04-13 DIAGNOSIS — Z20828 Contact with and (suspected) exposure to other viral communicable diseases: Secondary | ICD-10-CM

## 2017-04-13 MED ORDER — OSELTAMIVIR PHOSPHATE 75 MG PO CAPS: 75.0000 mg | ORAL_CAPSULE | Freq: Every day | ORAL | 0 refills | Status: DC

## 2017-04-13 NOTE — Telephone Encounter (Signed)
[place] and pt requesting tamiflu. Per Dr. Marlan Palau verbal order [place]

## 2017-04-13 NOTE — Telephone Encounter (Signed)
Advised-aa 

## 2017-04-13 NOTE — Telephone Encounter (Addendum)
Pt wife Rise Paganini called stating they are at the beach and she has the flu.  Pt wife states she was diagnosed with the flu today.  She is asking if the pt can get a Rx for Tamiflu.  [facility] [address], [place]  [id number].  CB#[phone]/MW

## 2017-04-14 NOTE — Progress Notes (Signed)
error 

## 2017-04-15 ENCOUNTER — Other Ambulatory Visit: Payer: Self-pay | Admitting: Orthopedic Surgery

## 2017-04-19 NOTE — Progress Notes (Signed)
Please place orders in EPIC as patient has a Pre-op appointment with the nurse at [facility] on 05/11/2017 at 2 pm! Thank you!

## 2017-04-28 ENCOUNTER — Ambulatory Visit: Payer: Self-pay | Admitting: Orthopedic Surgery

## 2017-05-03 DIAGNOSIS — E291 Testicular hypofunction: Secondary | ICD-10-CM | POA: Diagnosis not present

## 2017-05-07 ENCOUNTER — Encounter: Payer: Self-pay | Admitting: Emergency Medicine

## 2017-05-07 ENCOUNTER — Inpatient Hospital Stay
Admission: EM | Admit: 2017-05-07 | Discharge: 2017-05-10 | DRG: 871 | Disposition: A | Discharge status: Home / Self Care | Payer: Medicare Other | Attending: Internal Medicine | Admitting: Internal Medicine

## 2017-05-07 ENCOUNTER — Emergency Department: Payer: Medicare Other

## 2017-05-07 DIAGNOSIS — N400 Enlarged prostate without lower urinary tract symptoms: Secondary | ICD-10-CM | POA: Diagnosis present

## 2017-05-07 DIAGNOSIS — N183 Chronic kidney disease, stage 3 (moderate): Secondary | ICD-10-CM | POA: Diagnosis present

## 2017-05-07 DIAGNOSIS — Z86711 Personal history of pulmonary embolism: Secondary | ICD-10-CM | POA: Diagnosis not present

## 2017-05-07 DIAGNOSIS — Z7982 Long term (current) use of aspirin: Secondary | ICD-10-CM | POA: Diagnosis not present

## 2017-05-07 DIAGNOSIS — J189 Pneumonia, unspecified organism: Secondary | ICD-10-CM | POA: Diagnosis not present

## 2017-05-07 DIAGNOSIS — E785 Hyperlipidemia, unspecified: Secondary | ICD-10-CM | POA: Diagnosis present

## 2017-05-07 DIAGNOSIS — E1122 Type 2 diabetes mellitus with diabetic chronic kidney disease: Secondary | ICD-10-CM | POA: Diagnosis present

## 2017-05-07 DIAGNOSIS — Z794 Long term (current) use of insulin: Secondary | ICD-10-CM | POA: Diagnosis not present

## 2017-05-07 DIAGNOSIS — A419 Sepsis, unspecified organism: Principal | ICD-10-CM | POA: Diagnosis present

## 2017-05-07 DIAGNOSIS — E876 Hypokalemia: Secondary | ICD-10-CM | POA: Diagnosis present

## 2017-05-07 DIAGNOSIS — Z79899 Other long term (current) drug therapy: Secondary | ICD-10-CM

## 2017-05-07 DIAGNOSIS — E1165 Type 2 diabetes mellitus with hyperglycemia: Secondary | ICD-10-CM | POA: Diagnosis present

## 2017-05-07 DIAGNOSIS — R000 Tachycardia, unspecified: Secondary | ICD-10-CM | POA: Diagnosis present

## 2017-05-07 DIAGNOSIS — K219 Gastro-esophageal reflux disease without esophagitis: Secondary | ICD-10-CM | POA: Diagnosis present

## 2017-05-07 DIAGNOSIS — A020 Salmonella enteritis: Secondary | ICD-10-CM | POA: Diagnosis present

## 2017-05-07 DIAGNOSIS — Z8673 Personal history of transient ischemic attack (TIA), and cerebral infarction without residual deficits: Secondary | ICD-10-CM

## 2017-05-07 DIAGNOSIS — J45909 Unspecified asthma, uncomplicated: Secondary | ICD-10-CM | POA: Diagnosis present

## 2017-05-07 DIAGNOSIS — R05 Cough: Secondary | ICD-10-CM | POA: Diagnosis not present

## 2017-05-07 DIAGNOSIS — E860 Dehydration: Secondary | ICD-10-CM | POA: Diagnosis present

## 2017-05-07 DIAGNOSIS — G4733 Obstructive sleep apnea (adult) (pediatric): Secondary | ICD-10-CM | POA: Diagnosis present

## 2017-05-07 DIAGNOSIS — E119 Type 2 diabetes mellitus without complications: Secondary | ICD-10-CM | POA: Diagnosis not present

## 2017-05-07 DIAGNOSIS — I130 Hypertensive heart and chronic kidney disease with heart failure and stage 1 through stage 4 chronic kidney disease, or unspecified chronic kidney disease: Secondary | ICD-10-CM | POA: Diagnosis present

## 2017-05-07 DIAGNOSIS — N179 Acute kidney failure, unspecified: Secondary | ICD-10-CM | POA: Diagnosis present

## 2017-05-07 DIAGNOSIS — I5032 Chronic diastolic (congestive) heart failure: Secondary | ICD-10-CM | POA: Diagnosis present

## 2017-05-07 DIAGNOSIS — E1140 Type 2 diabetes mellitus with diabetic neuropathy, unspecified: Secondary | ICD-10-CM | POA: Diagnosis present

## 2017-05-07 DIAGNOSIS — I4891 Unspecified atrial fibrillation: Secondary | ICD-10-CM | POA: Diagnosis present

## 2017-05-07 DIAGNOSIS — R197 Diarrhea, unspecified: Secondary | ICD-10-CM | POA: Diagnosis not present

## 2017-05-07 LAB — CBC
HCT: 39.7 % — ABNORMAL LOW (ref 40.0–52.0)
Hemoglobin: 13 g/dL (ref 13.0–18.0)
MCH: 26 pg (ref 26.0–34.0)
MCHC: 32.7 g/dL (ref 32.0–36.0)
MCV: 79.4 fL — ABNORMAL LOW (ref 80.0–100.0)
Platelets: 117 10*3/uL — ABNORMAL LOW (ref 150–440)
RBC: 5 MIL/uL (ref 4.40–5.90)
RDW: 17.7 % — ABNORMAL HIGH (ref 11.5–14.5)
WBC: 10.6 10*3/uL (ref 3.8–10.6)

## 2017-05-07 LAB — COMPREHENSIVE METABOLIC PANEL
ALT: 27 U/L (ref 17–63)
AST: 34 U/L (ref 15–41)
Albumin: 3.8 g/dL (ref 3.5–5.0)
Alkaline Phosphatase: 68 U/L (ref 38–126)
Anion gap: 10 (ref 5–15)
BUN: 35 mg/dL — ABNORMAL HIGH (ref 6–20)
CO2: 29 mmol/L (ref 22–32)
Calcium: 8.8 mg/dL — ABNORMAL LOW (ref 8.9–10.3)
Chloride: 96 mmol/L — ABNORMAL LOW (ref 101–111)
Creatinine, Ser: 1.71 mg/dL — ABNORMAL HIGH (ref 0.61–1.24)
GFR calc Af Amer: 46 mL/min — ABNORMAL LOW (ref >60)
GFR calc non Af Amer: 40 mL/min — ABNORMAL LOW (ref >60)
Glucose, Bld: 206 mg/dL — ABNORMAL HIGH (ref 65–99)
Potassium: 3.8 mmol/L (ref 3.5–5.1)
Sodium: 135 mmol/L (ref 135–145)
Total Bilirubin: 0.8 mg/dL (ref 0.3–1.2)
Total Protein: 6.9 g/dL (ref 6.5–8.1)

## 2017-05-07 LAB — LACTIC ACID, PLASMA: Lactic Acid, Venous: 2.1 mmol/L (ref 0.5–1.9)

## 2017-05-07 MED ORDER — SODIUM CHLORIDE 0.9 % IV BOLUS (SEPSIS)
1000.0000 mL | Freq: Once | INTRAVENOUS | Status: AC
  Administered 2017-05-07: 1000 mL via INTRAVENOUS

## 2017-05-07 MED ORDER — VANCOMYCIN HCL IN DEXTROSE 1-5 GM/200ML-% IV SOLN
1000.0000 mg | Freq: Once | INTRAVENOUS | Status: AC
  Administered 2017-05-07: 1000 mg via INTRAVENOUS
  Filled 2017-05-07: qty 200

## 2017-05-07 MED ORDER — VANCOMYCIN HCL 10 G IV SOLR
1500.0000 mg | INTRAVENOUS | Status: DC
  Administered 2017-05-08: 1500 mg via INTRAVENOUS
  Filled 2017-05-07 (×3): qty 1500

## 2017-05-07 MED ORDER — SODIUM CHLORIDE 0.9 % IV BOLUS (SEPSIS): 1000.0000 mL | Freq: Once | INTRAVENOUS | Status: DC

## 2017-05-07 MED ORDER — IBUPROFEN 600 MG PO TABS
600.0000 mg | ORAL_TABLET | Freq: Once | ORAL | Status: AC
  Administered 2017-05-07: 600 mg via ORAL
  Filled 2017-05-07: qty 1

## 2017-05-07 MED ORDER — PIPERACILLIN-TAZOBACTAM 3.375 G IVPB 30 MIN
3.3750 g | Freq: Once | INTRAVENOUS | Status: AC
  Administered 2017-05-07: 3.375 g via INTRAVENOUS
  Filled 2017-05-07: qty 50

## 2017-05-07 MED ORDER — PIPERACILLIN-TAZOBACTAM 3.375 G IVPB
3.3750 g | Freq: Three times a day (TID) | INTRAVENOUS | Status: DC
  Administered 2017-05-08: 3.375 g via INTRAVENOUS
  Filled 2017-05-07 (×3): qty 50

## 2017-05-07 MED ORDER — SODIUM CHLORIDE 0.9 % IV BOLUS (SEPSIS)
1000.0000 mL | Freq: Once | INTRAVENOUS | Status: AC
Start: 2017-05-07 — End: 2017-05-07
  Administered 2017-05-07: 1000 mL via INTRAVENOUS

## 2017-05-07 NOTE — ED Triage Notes (Signed)
Pt to triage in wheelchair due to weakness. Pt c/o of continuous diarrhea, fever and general malaise since Thursday. Last tylenol dosage given at 1300. Fever 103.2 F in triage. Pt taken to [facility].

## 2017-05-07 NOTE — H&P (Signed)
History and Physical   [facility] - [place] @ [facility] Admission History and Physical [employer], D.O.    Patient Name: Jerry Parsons MR#: [id number] Date of Birth: 1951-03-09 Date of Admission: 05/07/2017  Referring MD/NP/PA: Dr. Owens Shark Primary Care Physician: Jerrol Banana., MD Patient coming from: Home  Chief Complaint:  Chief Complaint  Patient presents with  . Diarrhea  . Fever    HPI: Jerry Parsons is a 66 y.o. male with a known history of afib, OA, BPH, DM, DVT/PE, campylobacter infection in 2014, GERD, HLD, HTN, CKD, seizures, OSA, TIA presents to the emergency department for evaluation of fever.  Patient was in a usual state of health until two days  Ago when he developed diarrhea described as profuse, watery, dark and foul-smelling, similar to episode of Campylobacter that he had for years ago. He was hospitalized and treated twice for the same infection.  He reports symptoms of fevers, crampy abdominal pain with nausea but no vomiting. He has not had any recent antibiotics.  Patient denies, weakness, dizziness, chest pain, shortness of breath, dysuria/frequency, changes in mental status.    Of note patient is chronically short of breath. Otherwise there has been no change in status. Patient has been taking medication as prescribed and there has been no recent change in medication or diet.  No recent antibiotics.  There has been no recent illness, hospitalizations, travel. [name] His wife was sick with influenza a one week ago.    EMS/ED Course: Patient received Zosyn/Vanco, NS.  Review of Systems:  CONSTITUTIONAL: Positive fever/chills, negative fatigue, weakness, weight gain/loss, headache. EYES: No blurry or double vision. ENT: No tinnitus, postnasal drip, redness or soreness of the oropharynx. RESPIRATORY: No cough, dyspnea, wheeze.  No hemoptysis.  CARDIOVASCULAR: No chest pain, palpitations, syncope, orthopnea. No lower extremity edema.  GASTROINTESTINAL:  Positive nausea, abdominal pain, diarrhea. Negative vomiting, constipation.  No hematemesis, melena or hematochezia. GENITOURINARY: No dysuria, frequency, hematuria. ENDOCRINE: No polyuria or nocturia. No heat or cold intolerance. HEMATOLOGY: No anemia, bruising, bleeding. INTEGUMENTARY: No rashes, ulcers, lesions. MUSCULOSKELETAL: No arthritis, gout, dyspnea. NEUROLOGIC: No numbness, tingling, ataxia, seizure-type activity, weakness. PSYCHIATRIC: No anxiety, depression, insomnia.   Past Medical History:  Diagnosis Date  . Arrhythmia    tachycardia, A-Fib  . Arthritis   . Blind right eye   . BPH (benign prostatic hyperplasia)   . Diabetes mellitus   . DVT (deep venous thrombosis) ([place]) 02/2010  . Dysrhythmia   . 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 GERD (gastroesophageal reflux disease)   . Hypercholesteremia   . Hypertension   . Kidney failure   . Neuropathy   . Pneumonia    time 9 ;last episode 12/2015  . Pulmonary embolus ([place]) 2011  . Seasonal allergies   . Seizures ([place])    as child   . Sleep apnea    BIPAP  . Stiff neck    limited turning s/p titanium plate placement  . TIA (transient ischemic attack)   . Wears dentures    full upper and lower    Past Surgical History:  Procedure Laterality Date  . APPENDECTOMY    . BACK SURGERY     x 8; upper x 3 & lower x 5  . CARDIOVERSION  03/14/13, 10/16   2014 - [place], 2016 - [place]  . CATARACT EXTRACTION W/PHACO Left 10/29/2015   Procedure: CATARACT EXTRACTION PHACO AND INTRAOCULAR LENS PLACEMENT (IOC);  Surgeon: Leandrew Koyanagi, MD;  Location: [facility];  Service: Ophthalmology;  Laterality: Left;  DIABETIC - insulin and oral meds Sleep apnea - no machine  . CHOLECYSTECTOMY    . EYE SURGERY    . GALLBLADDER SURGERY  2002  . KNEE ARTHROSCOPY     left   . ROTATOR CUFF REPAIR  2001   left   . TONSILLECTOMY       reports that he quit smoking about 27 years ago. His smoking use included  Cigarettes. He has a 40.00 pack-year smoking history. He has never used smokeless tobacco. He reports that he does not drink alcohol or use drugs.  Allergies  Allergen Reactions  . Carisoprodol Itching    Family History   Medical History Relation Name Comments  Alcohol abuse Brother    Gout Brother    High blood pressure (Hypertension) Brother    Lung cancer Brother    Osteoarthritis Brother    Stroke Mother    Alcohol abuse Sister    Breast cancer Sister    High blood pressure (Hypertension) Sister       Prior to Admission medications   Medication Sig Start Date End Date Taking? Authorizing Provider  acetaminophen (TYLENOL) 500 MG tablet Take 500 mg by mouth every 6 (six) hours as needed (for headache.).    [provider]  amiodarone (PACERONE) 200 MG tablet TAKE 1 TABLET (200 MG TOTAL) BY MOUTH 2 (TWO) TIMES DAILY. Patient taking differently: TAKE 1 TABLET (200 MG TOTAL) BY MOUTH DAILY. 11/01/16   Minna Merritts, MD  aspirin EC 81 MG tablet Take 81 mg by mouth at bedtime.    [provider]  budesonide (RHINOCORT AQUA) 32 MCG/ACT nasal spray Place 1 spray into the nose daily as needed (for allergies.).     [provider]  cyclobenzaprine (FLEXERIL) 10 MG tablet TAKE 1 TABLET BY MOUTH 3 TIMES A DAY 06/01/16   Jerrol Banana., MD  dutasteride (AVODART) 0.5 MG capsule Take 1 capsule (0.5 mg total) by mouth daily. 08/04/16   Jerrol Banana., MD  esomeprazole (NEXIUM) 40 MG capsule Take 1 capsule (40 mg total) by mouth 2 (two) times daily. Patient taking differently: Take 40 mg by mouth 2 (two) times daily. Morning & Night 01/20/17   Jerrol Banana., MD  fexofenadine (ALLEGRA) 180 MG tablet Take 180 mg by mouth daily.      [provider]  furosemide (LASIX) 40 MG tablet Take 1 tablet (40 mg total) by mouth 2 (two) times daily as needed. Patient taking differently: Take 40 mg by mouth daily.  03/19/16   Minna Merritts, MD  gabapentin (NEURONTIN) 800 MG tablet TAKE 1 TABLET 3 TIMES A DAY Patient taking differently: TAKE 2 TABLET (1600 MG) 2 TIMES A DAY 11/19/16   Jerrol Banana., MD  glucose blood (ONE TOUCH ULTRA TEST) test strip Check sugar twice daily. Dx- E11.8 Patient taking differently: 1 each by Other route daily. Dx- E11.8 10/25/16   Jerrol Banana., MD  hydrochlorothiazide (HYDRODIURIL) 12.5 MG tablet TAKE 2 TABLETS (25 MG TOTAL) BY MOUTH AT BEDTIME. 03/25/17   Jerrol Banana., MD  Insulin Pen Needle (PEN NEEDLES) 31G X 5 MM MISC 1 each by Does not apply route daily. PATIENT NEEDS NOVA TWIST NEEDLES 5 MM  DX E11.9 10/18/16   Jerrol Banana., MD  LANTUS SOLOSTAR 100 UNIT/ML Solostar Pen Inject 35 Units into the skin 2 (two) times daily.  02/09/16   [provider]  magnesium oxide (MAG-OX) 400 (241.3 Mg) MG tablet TAKE 1 TABLET (400 MG TOTAL) BY MOUTH 2 (TWO) TIMES DAILY. 01/27/17   Jerrol Banana., MD  metFORMIN (GLUCOPHAGE) 1000 MG tablet TAKE 1 TABLET (1,000 MG TOTAL) BY MOUTH 2 (TWO) TIMES DAILY. 11/24/15   [provider]  metoprolol (LOPRESSOR) 100 MG tablet TAKE 1 TABLET (100 MG TOTAL) BY MOUTH 2 (TWO) TIMES DAILY. 02/21/17   Jerrol Banana., MD  mometasone (ELOCON) 0.1 % cream Apply 1 application topically daily. For 2 weeks Patient taking differently: Apply 1 application topically daily as needed (for ezcema flare up behind ears.).  03/02/16   Jerrol Banana., MD  Multiple Vitamin (MULTIVITAMIN WITH MINERALS) TABS tablet Take 1 tablet by mouth daily. Centrum Silver.    [provider]  NOVOLOG FLEXPEN 100 UNIT/ML FlexPen INJECT 6 UNITS 3 TIMES A   DAY Patient taking differently: INJECT 14 UNITS 3 TIMES A   DAY (MORNING, NOON, & NIGHT) 12/21/16   Jerrol Banana., MD  NOVOTWIST 32G X 5 MM MISC USE 6 TIMES A DAY DX E11.9 03/23/17   Jerrol Banana., MD  Omega-3 Fatty Acids (FISH OIL) 1200 MG CAPS Take 1,200 mg  by mouth 2 (two) times daily.    [provider]  oseltamivir (TAMIFLU) 75 MG capsule Take 1 capsule (75 mg total) by mouth daily. Patient not taking: Reported on 05/06/2017 04/13/17   Jerrol Banana., MD  oxyCODONE (OXYCONTIN) 40 mg 12 hr tablet Take 1 tablet (40 mg total) by mouth every 8 (eight) hours as needed. Patient taking differently: Take 40 mg by mouth 3 (three) times daily.  03/08/17   Jerrol Banana., MD  pregabalin (LYRICA) 50 MG capsule Take 1 capsule (50 mg total) by mouth 3 (three) times daily. 01/20/17   Jerrol Banana., MD  ranitidine (ZANTAC) 300 MG tablet TAKE 1 TABLET BY MOUTH EVERY DAY 02/28/17   Jerrol Banana., MD  sitaGLIPtin (JANUVIA) 100 MG tablet Take 1 tablet (100 mg total) by mouth daily. 01/20/17   Jerrol Banana., MD  testosterone cypionate (DEPOTESTOSTERONE CYPIONATE) 200 MG/ML injection Inject 200 mg into the muscle every 21 ( twenty-one) days.     [provider]  VICTOZA 18 MG/3ML SOPN INJECT 0.3ML (=1.8MG ) INTO THE SKIN DAILY 12/21/16   Jerrol Banana., MD    Physical Exam: Vitals:   05/07/17 2030 05/07/17 2100 05/07/17 2130 05/07/17 2145  BP: (!) 124/94 130/76 125/73   Pulse: (!) 111 (!) 106 (!) 107   Resp:  17 13   Temp:    (!) 101.4 F (38.6 C)  TempSrc:      SpO2: (!) 89% 91% 93%   Weight:        GENERAL: 66 y.o.-year-old male patient, well-developed, well-nourished lying in the bed in no acute distress.  Pleasant and cooperative.   HEENT: Head atraumatic, normocephalic. Pupils equal, round, reactive to light and accommodation. No scleral icterus. Extraocular muscles intact. Nares are patent. Oropharynx is clear. Mucus membranes moist. NECK: Supple, full range of motion. No JVD, no bruit heard. No thyroid enlargement, no tenderness, no cervical lymphadenopathy. CHEST: Normal breath sounds bilaterally. No wheezing, rales, rhonchi or crackles. No use of accessory muscles of respiration.  No  reproducible chest wall tenderness.  CARDIOVASCULAR: S1, S2 normal. No murmurs, rubs, or gallops. Cap refill <2 seconds. Pulses intact distally.  ABDOMEN: Soft, nondistended, Mild tenderness bilateral lower quadrants rebound, guarding, rigidity. Normoactive bowel sounds present in all four quadrants. No organomegaly or mass. EXTREMITIES: No pedal edema, cyanosis, or clubbing. No calf tenderness or Homan's sign.  NEUROLOGIC: The patient is alert and oriented x 3. Cranial nerves II through XII are grossly intact with no focal sensorimotor deficit. Muscle strength 5/5 in all extremities. Sensation intact. Gait not checked. PSYCHIATRIC:  Normal affect, mood, thought content. SKIN: Warm, dry, and intact without obvious rash, lesion, or ulcer.    Labs on Admission:  CBC:  Recent Labs Lab 05/07/17 2034  WBC 10.6  HGB 13.0  HCT 39.7*  MCV 79.4*  PLT 250*   Basic Metabolic Panel:  Recent Labs Lab 05/07/17 2034  NA 135  K 3.8  CL 96*  CO2 29  GLUCOSE 206*  BUN 35*  CREATININE 1.71*  CALCIUM 8.8*   GFR: Estimated Creatinine Clearance: 56.5 mL/min (A) (by C-G formula based on SCr of 1.71 mg/dL (H)). Liver Function Tests:  Recent Labs Lab 05/07/17 2034  AST 34  ALT 27  ALKPHOS 68  BILITOT 0.8  PROT 6.9  ALBUMIN 3.8   No results for input(s): LIPASE, AMYLASE in the last 168 hours. No results for input(s): AMMONIA in the last 168 hours. Coagulation Profile: No results for input(s): INR, PROTIME in the last 168 hours. Cardiac Enzymes: No results for input(s): CKTOTAL, CKMB, CKMBINDEX, TROPONINI in the last 168 hours. BNP (last 3 results) No results for input(s): PROBNP in the last 8760 hours. HbA1C: No results for input(s): HGBA1C in the last 72 hours. CBG: No results for input(s): GLUCAP in the last 168 hours. Lipid Profile: No results for input(s): CHOL, HDL, LDLCALC, TRIG, CHOLHDL, LDLDIRECT in the last 72 hours. Thyroid Function Tests: No results for input(s):  TSH, T4TOTAL, FREET4, T3FREE, THYROIDAB in the last 72 hours. Anemia Panel: No results for input(s): VITAMINB12, FOLATE, FERRITIN, TIBC, IRON, RETICCTPCT in the last 72 hours. Urine analysis:    Component Value Date/Time   COLORURINE YELLOW 11/29/2016 1500   APPEARANCEUR CLEAR 11/29/2016 1500   APPEARANCEUR Clear 04/12/2013 1256   LABSPEC 1.025 11/29/2016 1500   LABSPEC 1.015 04/12/2013 1256   PHURINE 7.0 11/29/2016 1500   GLUCOSEU >1000 (A) 11/29/2016 1500   GLUCOSEU >=500 04/12/2013 1256   HGBUR NEGATIVE 11/29/2016 1500   BILIRUBINUR NEGATIVE 11/29/2016 1500   BILIRUBINUR negative 02/09/2016 1534   BILIRUBINUR Negative 04/12/2013 1256   KETONESUR NEGATIVE 11/29/2016 1500   PROTEINUR NEGATIVE 11/29/2016 1500   UROBILINOGEN negative 02/09/2016 1534   UROBILINOGEN 0.2 02/05/2010 1041   NITRITE NEGATIVE 11/29/2016 1500   LEUKOCYTESUR NEGATIVE 11/29/2016 1500   LEUKOCYTESUR Negative 04/12/2013 1256   Sepsis Labs: @LABRCNTIP (procalcitonin:4,lacticidven:4) )No results found for this or any previous visit (from the past 240 hour(s)).   Radiological Exams on Admission: No results found.  Assessment/Plan  This is a 66 y.o. male with a history of afib, OA, BPH, DM, DVT/PE, campylobacter infection in 2014, GERD, HLD, HTN, CKD, seizures, OSA, TIA now being admitted with:  #. Sepsis, possibly GI infection given diarrhea and history of Campylobacter infection. - Admit to inpatient with telemetry monitoring - IV antibiotics: We'll continue Zosyn and Vanco for now pending cultures - IV fluid hydration - Follow up blood,urine,  stool and sputum cultures -Check for C. difficile  - Repeat CBC in am.   #. Acute kidney injury  - IV fluids and repeat BMP in AM.  - Avoid nephrotoxic medications - Bladder scan and  place foley catheter if evidence of urinary retention  #. H/o Diabetes - Accuchecks achs with RISS coverage - Heart healthy, carb controlled diet - Contineu Januvia,  neurontin, Lyrica, Victoza, Lantus - Hold metformin  #. History of HTN - Continue HCTZ, lopressor, Lasix  #. History of GERD - Continue Zantac, Nexuim  #. History of afib - Continue amiodarone  #. History of TIA - Continue aspirin  #. History of allergies/asthma - Continue budesonide, allegra  Admission status: Inpatient, tele IV Fluids: NS Diet/Nutrition: HH, CC Consults called: None  DVT Px: Lovenox, SCDs and early ambulation. Code Status: Full Code  Disposition Plan: To home in 1-2 days  All the records are reviewed and case discussed with ED provider. Management plans discussed with the patient and/or family who express understanding and agree with plan of care.  Citlalic Norlander D.O. on 05/07/2017 at 9:52 PM Between 7am to 6pm - Pager - [phone] After 6pm go to [email or web address] - [employer] [facility] Office [phone] CC: Primary care physician; Jerrol Banana., MD   05/07/2017, 9:52 PM

## 2017-05-07 NOTE — ED Notes (Signed)
Pt assisted out of car into wheelchair, skin hot to touch. Spouse states pt with diarrhea and weakness since yesterday. resps unlabored.

## 2017-05-07 NOTE — Progress Notes (Signed)
Pharmacy Antibiotic Note  Jerry Parsons is a 66 y.o. male admitted on 05/07/2017 with sepsis.  Pharmacy has been consulted for Zosyn and vancomycin dosing.  Plan: 1. Zosyn 3.375 gm IV Q8H EI 2. Vancomycin 1 gm IV x 1 in ED followed in approximately 6 hours (stacked dosing) by vancomycin 1.5 gm IV Q18H, predicted trough 15 mcg/mL. Pharmacy will continue to follow and adjust as needed to maintain trough 15 to 20 mcg/mL.   Vd 66.7 L, Ke 0.051 hr-1, T1/2 13.5 hr  Weight: 276 lb (125.2 kg)  Temp (24hrs), Avg:103.2 F (39.6 C), Min:103.2 F (39.6 C), Max:103.2 F (39.6 C)   Recent Labs Lab 05/07/17 2034  WBC 10.6  CREATININE 1.71*    Estimated Creatinine Clearance: 56.5 mL/min (A) (by C-G formula based on SCr of 1.71 mg/dL (H)).    Allergies  Allergen Reactions  . Carisoprodol Itching    Thank you for allowing pharmacy to be a part of this patient's care.  Laural Benes, Pharm.D., BCPS Clinical Pharmacist  05/07/2017 9:16 PM

## 2017-05-07 NOTE — ED Provider Notes (Signed)
[facility] Emergency Department Provider Note    First MD Initiated Contact with Patient 05/07/17 2028     (approximate)  I have reviewed the triage vital signs and the nursing notes.   HISTORY  Chief Complaint Diarrhea and Fever   HPI TRACKER Jerry Parsons is a 66 y.o. male with below list of chronic medical conditions presents to the emergency department with three-day history of profuse nonbloody diarrhea with fever generalized malaise. Patient afebrile on presentation temperature 103.2. Patient denies any abdominal discomfort. Patient does admit to dyspnea however no chest pain. Patient's only sick contact his wife who had influenza A 1 week ago   Past Medical History:  Diagnosis Date  . Arrhythmia    tachycardia, A-Fib  . Arthritis   . Blind right eye   . BPH (benign prostatic hyperplasia)   . Diabetes mellitus   . DVT (deep venous thrombosis) ([place]) 02/2010  . Dysrhythmia   . 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 GERD (gastroesophageal reflux disease)   . Hypercholesteremia   . Hypertension   . Kidney failure   . Neuropathy   . Pneumonia    time 9 ;last episode 12/2015  . Pulmonary embolus ([place]) 2011  . Seasonal allergies   . Seizures ([place])    as child   . Sleep apnea    BIPAP  . Stiff neck    limited turning s/p titanium plate placement  . TIA (transient ischemic attack)   . Wears dentures    full upper and lower    Patient Active Problem List   Diagnosis Date Noted  . Sepsis due to pneumonia ([place]) 05/07/2017  . Neuropathy 10/13/2016  . Injury of kidney 01/21/2016  . Amblyopia 12/30/2015  . 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 NS (nuclear sclerosis) 12/30/2015  . Pseudoaphakia 12/30/2015  . CVA (cerebral infarction) 09/08/2015  . Dehydration 09/08/2015  . Aspiration pneumonia ([place]) 07/04/2015  . Paroxysmal atrial fibrillation ([place]) 07/04/2015  . Arthritis of knee, degenerative 05/28/2015  . Chronic back pain 09/17/2014  .  Leg edema 05/07/2014  . Chronic diastolic CHF (congestive heart failure) ([place]) 05/07/2014  . Campylobacter diarrhea 04/25/2013  . Atrial flutter ([place]) 01/23/2013  . Obesity 05/19/2012  . Hyperlipidemia 11/11/2011  . Diastolic dysfunction 22/01/5426  . SOB (shortness of breath) 04/12/2011  . Diabetes mellitus type 2, uncontrolled, with complications ([place]) 06/19/7627  . HYPERTENSION, BENIGN 03/15/2011  . DVT 03/15/2011  . TACHYCARDIA 03/15/2011    Past Surgical History:  Procedure Laterality Date  . APPENDECTOMY    . BACK SURGERY     x 8; upper x 3 & lower x 5  . CARDIOVERSION  03/14/13, 10/16   2014 - [place], 2016 - [place]  . CATARACT EXTRACTION W/PHACO Left 10/29/2015   Procedure: CATARACT EXTRACTION PHACO AND INTRAOCULAR LENS PLACEMENT (IOC);  Surgeon: Leandrew Koyanagi, MD;  Location: [facility];  Service: Ophthalmology;  Laterality: Left;  DIABETIC - insulin and oral meds Sleep apnea - no machine  . CHOLECYSTECTOMY    . EYE SURGERY    . GALLBLADDER SURGERY  2002  . KNEE ARTHROSCOPY     left   . ROTATOR CUFF REPAIR  2001   left   . TONSILLECTOMY      Prior to Admission medications   Medication Sig Start Date End Date Taking? Authorizing Provider  acetaminophen (TYLENOL) 500 MG tablet Take 500 mg by mouth every 6 (six) hours as needed (for headache.).  [provider]  amiodarone (PACERONE) 200 MG tablet TAKE 1 TABLET (200 MG TOTAL) BY MOUTH 2 (TWO) TIMES DAILY. Patient taking differently: TAKE 1 TABLET (200 MG TOTAL) BY MOUTH DAILY. 11/01/16   Minna Merritts, MD  aspirin EC 81 MG tablet Take 81 mg by mouth at bedtime.    [provider]  budesonide (RHINOCORT AQUA) 32 MCG/ACT nasal spray Place 1 spray into the nose daily as needed (for allergies.).     [provider]  cyclobenzaprine (FLEXERIL) 10 MG tablet TAKE 1 TABLET BY MOUTH 3 TIMES A DAY 06/01/16   Jerrol Banana., MD  dutasteride (AVODART) 0.5 MG capsule Take 1 capsule (0.5  mg total) by mouth daily. 08/04/16   Jerrol Banana., MD  esomeprazole (NEXIUM) 40 MG capsule Take 1 capsule (40 mg total) by mouth 2 (two) times daily. Patient taking differently: Take 40 mg by mouth 2 (two) times daily. Morning & Night 01/20/17   Jerrol Banana., MD  fexofenadine (ALLEGRA) 180 MG tablet Take 180 mg by mouth daily.      [provider]  furosemide (LASIX) 40 MG tablet Take 1 tablet (40 mg total) by mouth 2 (two) times daily as needed. Patient taking differently: Take 40 mg by mouth daily.  03/19/16   Minna Merritts, MD  gabapentin (NEURONTIN) 800 MG tablet TAKE 1 TABLET 3 TIMES A DAY Patient taking differently: TAKE 2 TABLET (1600 MG) 2 TIMES A DAY 11/19/16   Jerrol Banana., MD  glucose blood (ONE TOUCH ULTRA TEST) test strip Check sugar twice daily. Dx- E11.8 Patient taking differently: 1 each by Other route daily. Dx- E11.8 10/25/16   Jerrol Banana., MD  hydrochlorothiazide (HYDRODIURIL) 12.5 MG tablet TAKE 2 TABLETS (25 MG TOTAL) BY MOUTH AT BEDTIME. 03/25/17   Jerrol Banana., MD  Insulin Pen Needle (PEN NEEDLES) 31G X 5 MM MISC 1 each by Does not apply route daily. PATIENT NEEDS NOVA TWIST NEEDLES 5 MM  DX E11.9 10/18/16   Jerrol Banana., MD  LANTUS SOLOSTAR 100 UNIT/ML Solostar Pen Inject 35 Units into the skin 2 (two) times daily.  02/09/16   [provider]  magnesium oxide (MAG-OX) 400 (241.3 Mg) MG tablet TAKE 1 TABLET (400 MG TOTAL) BY MOUTH 2 (TWO) TIMES DAILY. 01/27/17   Jerrol Banana., MD  metFORMIN (GLUCOPHAGE) 1000 MG tablet TAKE 1 TABLET (1,000 MG TOTAL) BY MOUTH 2 (TWO) TIMES DAILY. 11/24/15   [provider]  metoprolol (LOPRESSOR) 100 MG tablet TAKE 1 TABLET (100 MG TOTAL) BY MOUTH 2 (TWO) TIMES DAILY. 02/21/17   Jerrol Banana., MD  mometasone (ELOCON) 0.1 % cream Apply 1 application topically daily. For 2 weeks Patient taking differently: Apply 1 application topically daily as  needed (for ezcema flare up behind ears.).  03/02/16   Jerrol Banana., MD  Multiple Vitamin (MULTIVITAMIN WITH MINERALS) TABS tablet Take 1 tablet by mouth daily. Centrum Silver.    [provider]  NOVOLOG FLEXPEN 100 UNIT/ML FlexPen INJECT 6 UNITS 3 TIMES A   DAY Patient taking differently: INJECT 14 UNITS 3 TIMES A   DAY (MORNING, NOON, & NIGHT) 12/21/16   Jerrol Banana., MD  NOVOTWIST 32G X 5 MM MISC USE 6 TIMES A DAY DX E11.9 03/23/17   Jerrol Banana., MD  Omega-3 Fatty Acids (FISH OIL) 1200 MG CAPS Take 1,200 mg by mouth 2 (two)  times daily.    [provider]  oseltamivir (TAMIFLU) 75 MG capsule Take 1 capsule (75 mg total) by mouth daily. Patient not taking: Reported on 05/06/2017 04/13/17   Jerrol Banana., MD  oxyCODONE (OXYCONTIN) 40 mg 12 hr tablet Take 1 tablet (40 mg total) by mouth every 8 (eight) hours as needed. Patient taking differently: Take 40 mg by mouth 3 (three) times daily.  03/08/17   Jerrol Banana., MD  pregabalin (LYRICA) 50 MG capsule Take 1 capsule (50 mg total) by mouth 3 (three) times daily. 01/20/17   Jerrol Banana., MD  ranitidine (ZANTAC) 300 MG tablet TAKE 1 TABLET BY MOUTH EVERY DAY 02/28/17   Jerrol Banana., MD  sitaGLIPtin (JANUVIA) 100 MG tablet Take 1 tablet (100 mg total) by mouth daily. 01/20/17   Jerrol Banana., MD  testosterone cypionate (DEPOTESTOSTERONE CYPIONATE) 200 MG/ML injection Inject 200 mg into the muscle every 21 ( twenty-one) days.     [provider]  VICTOZA 18 MG/3ML SOPN INJECT 0.3ML (=1.8MG ) INTO THE SKIN DAILY 12/21/16   Jerrol Banana., MD    Allergies Carisoprodol  Family History  Problem Relation Age of Onset  . Heart attack Brother     Social History Social History  Substance Use Topics  . Smoking status: Former Smoker    Packs/day: 2.00    Years: 20.00    Types: Cigarettes    Quit date: 12/26/1989  . Smokeless tobacco: Never Used    . Alcohol use No    Review of Systems Constitutional: Positive for fever/chills Eyes: No visual changes. ENT: No sore throat. Cardiovascular: Denies chest pain. Respiratory: Positive for shortness of breath. Gastrointestinal: No abdominal pain.  No nausea, no vomiting.  No diarrhea.  No constipation. Genitourinary: Negative for dysuria. Musculoskeletal: Negative for back pain. Integumentary: Negative for rash. Neurological: Negative for headaches, focal weakness or numbness.   ____________________________________________   PHYSICAL EXAM:  VITAL SIGNS: ED Triage Vitals [05/07/17 2023]  Enc Vitals Group     BP 138/65     Pulse Rate (!) 110     Resp 18     Temp (!) 103.2 F (39.6 C)     Temp Source Oral     SpO2 (!) 89 %     Weight 276 lb (125.2 kg)     Height      Head Circumference      Peak Flow      Pain Score      Pain Loc      Pain Edu?      Excl. in [place]?     Constitutional: Alert and oriented.  Eyes: Conjunctivae are normal. PERRL. EOMI. Head: Atraumatic. Mouth/Throat: Mucous membranes are moist.  Oropharynx non-erythematous. Neck: No stridor.   Cardiovascular: Tachycardic, regular rhythm. Good peripheral circulation. Grossly normal heart sounds. Respiratory: Tachypnea.  No retractions. Bibasilar rhonchi Gastrointestinal: Soft and nontender. No distention.  Musculoskeletal: No lower extremity tenderness nor edema. No gross deformities of extremities. Neurologic:  Normal speech and language. No gross focal neurologic deficits are appreciated.  Skin:  Skin is warm, dry and intact. No rash noted. Psychiatric: Mood and affect are normal. Speech and behavior are normal.  ____________________________________________   LABS (all labs ordered are listed, but only abnormal results are displayed)  Labs Reviewed  CBC - Abnormal; Notable for the following:       Result Value   HCT 39.7 (*)    MCV 79.4 (*)  RDW 17.7 (*)    Platelets 117 (*)    All other  components within normal limits  COMPREHENSIVE METABOLIC PANEL - Abnormal; Notable for the following:    Chloride 96 (*)    Glucose, Bld 206 (*)    BUN 35 (*)    Creatinine, Ser 1.71 (*)    Calcium 8.8 (*)    GFR calc non Af Amer 40 (*)    GFR calc Af Amer 46 (*)    All other components within normal limits  LACTIC ACID, PLASMA - Abnormal; Notable for the following:    Lactic Acid, Venous 2.1 (*)    All other components within normal limits  GASTROINTESTINAL PANEL BY PCR, STOOL (REPLACES STOOL CULTURE)  CULTURE, BLOOD (ROUTINE X 2)  CULTURE, BLOOD (ROUTINE X 2)  LACTIC ACID, PLASMA  URINALYSIS, COMPLETE (UACMP) WITH MICROSCOPIC  INFLUENZA PANEL BY PCR (TYPE A & B)   ____________________________________________  RADIOLOGY I, Troy N Kimora Stankovic, personally viewed and evaluated these images (plain radiographs) as part of my medical decision making, as well as reviewing the written report by the radiologist.  Dg Chest Portable 1 View  Result Date: 05/07/2017 CLINICAL DATA:  Fever and cough. EXAM: PORTABLE CHEST 1 VIEW COMPARISON:  09/08/2015 prior exams FINDINGS: Upper limits normal heart size again noted. There is no evidence of focal airspace disease, pulmonary edema, suspicious pulmonary nodule/mass, pleural effusion, or pneumothorax. No acute bony abnormalities are identified. IMPRESSION: No active disease. Electronically Signed   By: Margarette Canada M.D.   On: 05/07/2017 21:53    ____________________________________________   PROCEDURES  Critical Care performed: CRITICAL CARE Performed by: Gregor Hams   Total critical care time: 40 minutes  Critical care time was exclusive of separately billable procedures and treating other patients.  Critical care was necessary to treat or prevent imminent or life-threatening deterioration.  Critical care was time spent personally by me on the following activities: development of treatment plan with patient and/or surrogate as well as  nursing, discussions with consultants, evaluation of patient's response to treatment, examination of patient, obtaining history from patient or surrogate, ordering and performing treatments and interventions, ordering and review of laboratory studies, ordering and review of radiographic studies, pulse oximetry and re-evaluation of patient's condition.  Procedures   ____________________________________________   INITIAL IMPRESSION / ASSESSMENT AND PLAN / ED COURSE  Pertinent labs & imaging results that were available during my care of the patient were reviewed by me and considered in my medical decision making (see chart for details).  C62-year-old male presenting with tachycardia tachypnea hypoxia and fever concern for sepsis as such sepsis protocol was initiated. Laboratory data notable for creatinine 1.71 lactic acid is 2.1. Concern for possible pneumonia however chest x-ray per radiologist does not reveal any evidence of pneumonia. Concern based on clinical exam. Blood cultures obtained urinalysis pending. Patient discussed with Dr.Huglemeyer for hospital admission for further evaluation and management.      ____________________________________________  FINAL CLINICAL IMPRESSION(S) / ED DIAGNOSES  Final diagnoses:  Sepsis, due to unspecified organism [facility])     MEDICATIONS GIVEN DURING THIS VISIT:  Medications  sodium chloride 0.9 % bolus 1,000 mL (not administered)  sodium chloride 0.9 % bolus 1,000 mL (0 mLs Intravenous Stopped 05/07/17 2141)    And  sodium chloride 0.9 % bolus 1,000 mL (1,000 mLs Intravenous New Bag/Given 05/07/17 2056)    And  sodium chloride 0.9 % bolus 1,000 mL (not administered)    And  sodium chloride 0.9 %  bolus 1,000 mL (1,000 mLs Intravenous New Bag/Given 05/07/17 2141)  vancomycin (VANCOCIN) IVPB 1000 mg/200 mL premix (1,000 mg Intravenous New Bag/Given 05/07/17 2141)  piperacillin-tazobactam (ZOSYN) IVPB 3.375 g (not administered)  vancomycin  (VANCOCIN) 1,500 mg in sodium chloride 0.9 % 500 mL IVPB (not administered)  ibuprofen (ADVIL,MOTRIN) tablet 600 mg (not administered)  piperacillin-tazobactam (ZOSYN) IVPB 3.375 g (0 g Intravenous Stopped 05/07/17 2141)     NEW OUTPATIENT MEDICATIONS STARTED DURING THIS VISIT:  New Prescriptions   No medications on file    Modified Medications   No medications on file    Discontinued Medications   No medications on file     Note:  This document was prepared using Dragon voice recognition software and may include unintentional dictation errors.    Gregor Hams, MD 05/07/17 2224

## 2017-05-08 DIAGNOSIS — A419 Sepsis, unspecified organism: Secondary | ICD-10-CM | POA: Diagnosis present

## 2017-05-08 LAB — CBC
HCT: 35.2 % — ABNORMAL LOW (ref 40.0–52.0)
Hemoglobin: 11.6 g/dL — ABNORMAL LOW (ref 13.0–18.0)
MCH: 26.6 pg (ref 26.0–34.0)
MCHC: 32.9 g/dL (ref 32.0–36.0)
MCV: 80.7 fL (ref 80.0–100.0)
Platelets: 109 10*3/uL — ABNORMAL LOW (ref 150–440)
RBC: 4.35 MIL/uL — ABNORMAL LOW (ref 4.40–5.90)
RDW: 17.2 % — ABNORMAL HIGH (ref 11.5–14.5)
WBC: 6.8 10*3/uL (ref 3.8–10.6)

## 2017-05-08 LAB — C DIFFICILE QUICK SCREEN W PCR REFLEX
C Diff antigen: NEGATIVE
C Diff interpretation: NOT DETECTED
C Diff toxin: NEGATIVE

## 2017-05-08 LAB — LACTIC ACID, PLASMA
Lactic Acid, Venous: 1.8 mmol/L (ref 0.5–1.9)
Lactic Acid, Venous: 1.8 mmol/L (ref 0.5–1.9)

## 2017-05-08 LAB — GASTROINTESTINAL PANEL BY PCR, STOOL (REPLACES STOOL CULTURE)

## 2017-05-08 LAB — PROCALCITONIN: Procalcitonin: 1.05 ng/mL

## 2017-05-08 LAB — PHOSPHORUS: Phosphorus: 2.2 mg/dL — ABNORMAL LOW (ref 2.5–4.6)

## 2017-05-08 LAB — INFLUENZA PANEL BY PCR (TYPE A & B)
Influenza A By PCR: NEGATIVE
Influenza B By PCR: NEGATIVE

## 2017-05-08 LAB — URINALYSIS, COMPLETE (UACMP) WITH MICROSCOPIC
Bacteria, UA: NONE SEEN
Bilirubin Urine: NEGATIVE
Glucose, UA: 50 mg/dL — AB
Hgb urine dipstick: NEGATIVE
Ketones, ur: NEGATIVE mg/dL
Leukocytes, UA: NEGATIVE
Nitrite: NEGATIVE
Protein, ur: 30 mg/dL — AB
RBC / HPF: NONE SEEN RBC/hpf (ref 0–5)
Specific Gravity, Urine: 1.02 (ref 1.005–1.030)
Squamous Epithelial / LPF: NONE SEEN
pH: 5 (ref 5.0–8.0)

## 2017-05-08 LAB — GLUCOSE, CAPILLARY
Glucose-Capillary: 150 mg/dL — ABNORMAL HIGH (ref 65–99)
Glucose-Capillary: 152 mg/dL — ABNORMAL HIGH (ref 65–99)
Glucose-Capillary: 155 mg/dL — ABNORMAL HIGH (ref 65–99)
Glucose-Capillary: 176 mg/dL — ABNORMAL HIGH (ref 65–99)
Glucose-Capillary: 200 mg/dL — ABNORMAL HIGH (ref 65–99)

## 2017-05-08 LAB — BASIC METABOLIC PANEL
Anion gap: 9 (ref 5–15)
BUN: 32 mg/dL — ABNORMAL HIGH (ref 6–20)
CO2: 26 mmol/L (ref 22–32)
Calcium: 7.9 mg/dL — ABNORMAL LOW (ref 8.9–10.3)
Chloride: 102 mmol/L (ref 101–111)
Creatinine, Ser: 1.62 mg/dL — ABNORMAL HIGH (ref 0.61–1.24)
GFR calc Af Amer: 49 mL/min — ABNORMAL LOW (ref >60)
GFR calc non Af Amer: 42 mL/min — ABNORMAL LOW (ref >60)
Glucose, Bld: 179 mg/dL — ABNORMAL HIGH (ref 65–99)
Potassium: 3.1 mmol/L — ABNORMAL LOW (ref 3.5–5.1)
Sodium: 137 mmol/L (ref 135–145)

## 2017-05-08 LAB — MAGNESIUM: Magnesium: 1.8 mg/dL (ref 1.7–2.4)

## 2017-05-08 LAB — APTT: aPTT: 37 seconds — ABNORMAL HIGH (ref 24–36)

## 2017-05-08 LAB — PROTIME-INR
INR: 1.29
Prothrombin Time: 16.2 seconds — ABNORMAL HIGH (ref 11.4–15.2)

## 2017-05-08 MED ORDER — OXYCODONE HCL ER 15 MG PO T12A
40.0000 mg | EXTENDED_RELEASE_TABLET | Freq: Three times a day (TID) | ORAL | Status: DC
  Administered 2017-05-08 – 2017-05-10 (×7): 40 mg via ORAL
  Filled 2017-05-08 (×7): qty 2

## 2017-05-08 MED ORDER — IPRATROPIUM BROMIDE 0.02 % IN SOLN
0.5000 mg | Freq: Four times a day (QID) | RESPIRATORY_TRACT | Status: DC | PRN
Start: 2017-05-08 — End: 2017-05-10

## 2017-05-08 MED ORDER — ACETAMINOPHEN 650 MG RE SUPP: 650.0000 mg | Freq: Four times a day (QID) | RECTAL | Status: DC | PRN

## 2017-05-08 MED ORDER — MOMETASONE FUROATE 0.1 % EX CREA
1.0000 "application " | TOPICAL_CREAM | Freq: Every day | CUTANEOUS | Status: DC | PRN
  Filled 2017-05-08: qty 15

## 2017-05-08 MED ORDER — LINAGLIPTIN 5 MG PO TABS
5.0000 mg | ORAL_TABLET | Freq: Every day | ORAL | Status: DC
  Administered 2017-05-08 – 2017-05-10 (×3): 5 mg via ORAL
  Filled 2017-05-08 (×3): qty 1

## 2017-05-08 MED ORDER — ENOXAPARIN SODIUM 40 MG/0.4ML ~~LOC~~ SOLN
40.0000 mg | SUBCUTANEOUS | Status: DC
  Administered 2017-05-08 – 2017-05-09 (×3): 40 mg via SUBCUTANEOUS
  Filled 2017-05-08 (×3): qty 0.4

## 2017-05-08 MED ORDER — ONDANSETRON HCL 4 MG PO TABS: 4.0000 mg | ORAL_TABLET | Freq: Four times a day (QID) | ORAL | Status: DC | PRN

## 2017-05-08 MED ORDER — INSULIN GLARGINE 100 UNIT/ML SOLOSTAR PEN: 35.0000 [IU] | PEN_INJECTOR | Freq: Two times a day (BID) | SUBCUTANEOUS | Status: DC

## 2017-05-08 MED ORDER — LIRAGLUTIDE 18 MG/3ML ~~LOC~~ SOPN
1.8000 mg | PEN_INJECTOR | Freq: Every day | SUBCUTANEOUS | Status: DC
  Filled 2017-05-08: qty 3

## 2017-05-08 MED ORDER — PREGABALIN 50 MG PO CAPS
50.0000 mg | ORAL_CAPSULE | Freq: Three times a day (TID) | ORAL | Status: DC
  Administered 2017-05-08 – 2017-05-10 (×8): 50 mg via ORAL
  Filled 2017-05-08 (×8): qty 1

## 2017-05-08 MED ORDER — PANTOPRAZOLE SODIUM 40 MG PO TBEC
40.0000 mg | DELAYED_RELEASE_TABLET | Freq: Every day | ORAL | Status: DC
  Administered 2017-05-08 – 2017-05-10 (×3): 40 mg via ORAL
  Filled 2017-05-08 (×3): qty 1

## 2017-05-08 MED ORDER — DEXTROSE 5 % IV SOLN
1.0000 g | Freq: Once | INTRAVENOUS | Status: DC
  Filled 2017-05-08: qty 10

## 2017-05-08 MED ORDER — INSULIN GLARGINE 100 UNIT/ML ~~LOC~~ SOLN
35.0000 [IU] | Freq: Two times a day (BID) | SUBCUTANEOUS | Status: DC
  Administered 2017-05-08 – 2017-05-09 (×5): 35 [IU] via SUBCUTANEOUS
  Filled 2017-05-08 (×7): qty 0.35

## 2017-05-08 MED ORDER — ACETAMINOPHEN 325 MG PO TABS
650.0000 mg | ORAL_TABLET | Freq: Four times a day (QID) | ORAL | Status: DC | PRN
  Administered 2017-05-08 (×3): 650 mg via ORAL
  Filled 2017-05-08 (×2): qty 2

## 2017-05-08 MED ORDER — ASPIRIN EC 81 MG PO TBEC
81.0000 mg | DELAYED_RELEASE_TABLET | Freq: Every day | ORAL | Status: DC
Start: 2017-05-08 — End: 2017-05-10
  Administered 2017-05-08 – 2017-05-09 (×3): 81 mg via ORAL
  Filled 2017-05-08 (×3): qty 1

## 2017-05-08 MED ORDER — CYCLOBENZAPRINE HCL 10 MG PO TABS
10.0000 mg | ORAL_TABLET | Freq: Three times a day (TID) | ORAL | Status: DC
  Administered 2017-05-08 – 2017-05-10 (×7): 10 mg via ORAL
  Filled 2017-05-08 (×7): qty 1

## 2017-05-08 MED ORDER — DEXTROSE 5 % IV SOLN
500.0000 mg | INTRAVENOUS | Status: DC
  Filled 2017-05-08: qty 500

## 2017-05-08 MED ORDER — ONDANSETRON HCL 4 MG/2ML IJ SOLN
4.0000 mg | Freq: Four times a day (QID) | INTRAMUSCULAR | Status: DC | PRN
  Administered 2017-05-08 – 2017-05-10 (×5): 4 mg via INTRAVENOUS
  Filled 2017-05-08 (×4): qty 2

## 2017-05-08 MED ORDER — SENNOSIDES-DOCUSATE SODIUM 8.6-50 MG PO TABS: 1.0000 | ORAL_TABLET | Freq: Every evening | ORAL | Status: DC | PRN

## 2017-05-08 MED ORDER — FUROSEMIDE 40 MG PO TABS
40.0000 mg | ORAL_TABLET | Freq: Every day | ORAL | Status: DC
  Administered 2017-05-08 – 2017-05-09 (×2): 40 mg via ORAL
  Filled 2017-05-08 (×2): qty 1

## 2017-05-08 MED ORDER — FLUTICASONE PROPIONATE 50 MCG/ACT NA SUSP
1.0000 | Freq: Every day | NASAL | Status: DC
  Administered 2017-05-09: 1 via NASAL
  Filled 2017-05-08: qty 16

## 2017-05-08 MED ORDER — HYDROCHLOROTHIAZIDE 25 MG PO TABS
25.0000 mg | ORAL_TABLET | Freq: Every day | ORAL | Status: DC
  Administered 2017-05-08 – 2017-05-09 (×3): 25 mg via ORAL
  Filled 2017-05-08 (×3): qty 1

## 2017-05-08 MED ORDER — LORATADINE 10 MG PO TABS
10.0000 mg | ORAL_TABLET | Freq: Every day | ORAL | Status: DC
  Administered 2017-05-08 – 2017-05-10 (×3): 10 mg via ORAL
  Filled 2017-05-08 (×3): qty 1

## 2017-05-08 MED ORDER — MAGNESIUM OXIDE 400 (241.3 MG) MG PO TABS
400.0000 mg | ORAL_TABLET | Freq: Two times a day (BID) | ORAL | Status: DC
  Administered 2017-05-08 – 2017-05-10 (×6): 400 mg via ORAL
  Filled 2017-05-08 (×7): qty 1

## 2017-05-08 MED ORDER — OXYCODONE HCL 5 MG PO TABS
5.0000 mg | ORAL_TABLET | Freq: Once | ORAL | Status: AC
  Administered 2017-05-08: 5 mg via ORAL
  Filled 2017-05-08: qty 1

## 2017-05-08 MED ORDER — INSULIN ASPART 100 UNIT/ML ~~LOC~~ SOLN
0.0000 [IU] | Freq: Three times a day (TID) | SUBCUTANEOUS | Status: DC
  Administered 2017-05-08 (×2): 4 [IU] via SUBCUTANEOUS
  Administered 2017-05-09 (×2): 3 [IU] via SUBCUTANEOUS
  Filled 2017-05-08: qty 3
  Filled 2017-05-08: qty 4
  Filled 2017-05-08: qty 3
  Filled 2017-05-08: qty 4

## 2017-05-08 MED ORDER — POTASSIUM CHLORIDE CRYS ER 20 MEQ PO TBCR
40.0000 meq | EXTENDED_RELEASE_TABLET | ORAL | Status: AC
  Administered 2017-05-08: 40 meq via ORAL
  Filled 2017-05-08: qty 2

## 2017-05-08 MED ORDER — BISACODYL 5 MG PO TBEC: 5.0000 mg | DELAYED_RELEASE_TABLET | Freq: Every day | ORAL | Status: DC | PRN

## 2017-05-08 MED ORDER — MORPHINE SULFATE (PF) 4 MG/ML IV SOLN
4.0000 mg | Freq: Once | INTRAVENOUS | Status: AC
  Administered 2017-05-08: 4 mg via INTRAVENOUS
  Filled 2017-05-08: qty 1

## 2017-05-08 MED ORDER — OXYCODONE HCL 5 MG PO TABS
5.0000 mg | ORAL_TABLET | ORAL | Status: DC | PRN
  Administered 2017-05-08 (×2): 5 mg via ORAL
  Filled 2017-05-08 (×2): qty 1

## 2017-05-08 MED ORDER — METOPROLOL TARTRATE 50 MG PO TABS
100.0000 mg | ORAL_TABLET | Freq: Two times a day (BID) | ORAL | Status: DC
  Administered 2017-05-08 – 2017-05-10 (×6): 100 mg via ORAL
  Filled 2017-05-08 (×6): qty 2

## 2017-05-08 MED ORDER — AMIODARONE HCL 200 MG PO TABS
200.0000 mg | ORAL_TABLET | Freq: Every day | ORAL | Status: DC
  Administered 2017-05-08 – 2017-05-10 (×3): 200 mg via ORAL
  Filled 2017-05-08 (×3): qty 1

## 2017-05-08 MED ORDER — CEFTRIAXONE SODIUM-DEXTROSE 2-2.22 GM-% IV SOLR
2.0000 g | INTRAVENOUS | Status: DC
  Filled 2017-05-08: qty 50

## 2017-05-08 MED ORDER — INSULIN ASPART 100 UNIT/ML ~~LOC~~ SOLN: 0.0000 [IU] | Freq: Every day | SUBCUTANEOUS | Status: DC

## 2017-05-08 MED ORDER — MAGNESIUM CITRATE PO SOLN
1.0000 | Freq: Once | ORAL | Status: DC | PRN
  Filled 2017-05-08: qty 296

## 2017-05-08 MED ORDER — DUTASTERIDE 0.5 MG PO CAPS
0.5000 mg | ORAL_CAPSULE | Freq: Every day | ORAL | Status: DC
  Administered 2017-05-08 – 2017-05-10 (×3): 0.5 mg via ORAL
  Filled 2017-05-08 (×4): qty 1

## 2017-05-08 MED ORDER — ALBUTEROL SULFATE (2.5 MG/3ML) 0.083% IN NEBU: 2.5000 mg | INHALATION_SOLUTION | Freq: Four times a day (QID) | RESPIRATORY_TRACT | Status: DC | PRN

## 2017-05-08 MED ORDER — POTASSIUM CHLORIDE IN NACL 20-0.9 MEQ/L-% IV SOLN
INTRAVENOUS | Status: DC
Start: 2017-05-08 — End: 2017-05-10
  Administered 2017-05-08 – 2017-05-09 (×3): via INTRAVENOUS
  Administered 2017-05-09 – 2017-05-10 (×2): 125 mL/h via INTRAVENOUS
  Filled 2017-05-08 (×8): qty 1000

## 2017-05-08 MED ORDER — SODIUM CHLORIDE 0.9 % IV SOLN
INTRAVENOUS | Status: DC
  Administered 2017-05-08: 100 mL/h via INTRAVENOUS

## 2017-05-08 MED ORDER — TRIAMCINOLONE ACETONIDE 0.5 % EX CREA
TOPICAL_CREAM | Freq: Every day | CUTANEOUS | Status: DC | PRN
  Filled 2017-05-08: qty 15

## 2017-05-08 MED ORDER — PREGABALIN 50 MG PO CAPS: 50.0000 mg | ORAL_CAPSULE | Freq: Three times a day (TID) | ORAL | Status: DC

## 2017-05-08 MED ORDER — FAMOTIDINE 20 MG PO TABS
40.0000 mg | ORAL_TABLET | Freq: Every day | ORAL | Status: DC
  Administered 2017-05-08 – 2017-05-10 (×3): 40 mg via ORAL
  Filled 2017-05-08 (×3): qty 2

## 2017-05-08 NOTE — Progress Notes (Addendum)
Pt c/o severe pain to left leg. Pt having restless, tremors/spasms to left leg, states pain is 10/10. Pt in obvious discomfort, grimacing, speaking through clenched teeth. Paged MD.  [id number]: New orders placed by Dr. Darvin Neighbours. Pt given 4mg  Morphine IV.

## 2017-05-08 NOTE — Progress Notes (Signed)
[place] [facility] [place] NAME: Jerry Parsons    MR#:  [id number]  DATE OF BIRTH:  1951/02/01  SUBJECTIVE:  CHIEF COMPLAINT:   Chief Complaint  Patient presents with  . Diarrhea  . Fever   Continues to have watery diarrhea. No fever at this time. No abdominal pain. No blood in stool.  REVIEW OF SYSTEMS:    Review of Systems  Constitutional: Positive for malaise/fatigue. Negative for chills and fever.  HENT: Negative for sore throat.   Eyes: Negative for blurred vision, double vision and pain.  Respiratory: Negative for cough, hemoptysis, shortness of breath and wheezing.   Cardiovascular: Negative for chest pain, palpitations, orthopnea and leg swelling.  Gastrointestinal: Positive for diarrhea. Negative for abdominal pain, constipation, heartburn, nausea and vomiting.  Genitourinary: Negative for dysuria and hematuria.  Musculoskeletal: Positive for joint pain. Negative for back pain.  Skin: Negative for rash.  Neurological: Positive for weakness. Negative for sensory change, speech change, focal weakness and headaches.  Endo/Heme/Allergies: Does not bruise/bleed easily.  Psychiatric/Behavioral: Negative for depression. The patient is not nervous/anxious.     DRUG ALLERGIES:   Allergies  Allergen Reactions  . Carisoprodol Itching    VITALS:  Blood pressure (!) 147/70, pulse 99, temperature 98.7 F (37.1 C), resp. rate 20, height 5\' 11"  (1.803 m), weight 121.5 kg (267 lb 12.8 oz), SpO2 96 %.  PHYSICAL EXAMINATION:   Physical Exam  GENERAL:  66 y.o.-year-old patient lying in the bed with no acute distress.  EYES: Pupils equal, round, reactive to light and accommodation. No scleral icterus. Extraocular muscles intact.  HEENT: Head atraumatic, normocephalic. Oropharynx and nasopharynx clear.  NECK:  Supple, no jugular venous distention. No thyroid enlargement, no tenderness.  LUNGS: Normal breath sounds bilaterally, no wheezing,  rales, rhonchi. No use of accessory muscles of respiration.  CARDIOVASCULAR: S1, S2 normal. No murmurs, rubs, or gallops.  ABDOMEN: Soft, Mild tenderness, nondistended. Bowel sounds present. No organomegaly or mass.  EXTREMITIES: No cyanosis, clubbing or edema b/l.    NEUROLOGIC: Cranial nerves II through XII are intact. No focal Motor or sensory deficits b/l.   PSYCHIATRIC: The patient is alert and oriented x 3.  SKIN: No obvious rash, lesion, or ulcer.   LABORATORY PANEL:   CBC  Recent Labs Lab 05/08/17 0300  WBC 6.8  HGB 11.6*  HCT 35.2*  PLT 109*   ------------------------------------------------------------------------------------------------------------------ Chemistries   Recent Labs Lab 05/07/17 2034 05/08/17 0026 05/08/17 0300  NA 135  --  137  K 3.8  --  3.1*  CL 96*  --  102  CO2 29  --  26  GLUCOSE 206*  --  179*  BUN 35*  --  32*  CREATININE 1.71*  --  1.62*  CALCIUM 8.8*  --  7.9*  MG  --  1.8  --   AST 34  --   --   ALT 27  --   --   ALKPHOS 68  --   --   BILITOT 0.8  --   --    ------------------------------------------------------------------------------------------------------------------  Cardiac Enzymes No results for input(s): TROPONINI in the last 168 hours. ------------------------------------------------------------------------------------------------------------------  RADIOLOGY:  Dg Chest Portable 1 View  Result Date: 05/07/2017 CLINICAL DATA:  Fever and cough. EXAM: PORTABLE CHEST 1 VIEW COMPARISON:  09/08/2015 prior exams FINDINGS: Upper limits normal heart size again noted. There is no evidence of focal airspace disease, pulmonary edema, suspicious pulmonary nodule/mass, pleural effusion, or pneumothorax. No acute bony  abnormalities are identified. IMPRESSION: No active disease. Electronically Signed   By: Margarette Canada M.D.   On: 05/07/2017 21:53     ASSESSMENT AND PLAN:   * Non-typhoid Salmonella diarrhea with sepsis present on  admission Continues to have significant watery diarrhea. Will increase fluids and add potassium. Stop Zosyn and vancomycin. If diarrhea as it does not improve by tomorrow or patient has fever will start ciprofloxacin. No abdominal pain. Afebrile at this time.  * Acute kidney injury due to dehydration and sepsis. Increase IV fluid rate. Monitor input and output. Repeat labs in the morning.  * Diabetes mellitus. Sliding scale insulin.  All the records are reviewed and case discussed with Care Management/Social Workerr. Management plans discussed with the patient, family and they are in agreement.  CODE STATUS:  FULL CODE  DVT Prophylaxis: SCDs  TOTAL TIME TAKING CARE OF THIS PATIENT: 35 minutes.   POSSIBLE D/C IN 1-2 DAYS, DEPENDING ON CLINICAL CONDITION.  Hillary Bow R M.D on 05/08/2017 at 11:43 AM  Between 7am to 6pm - Pager - [phone]  After 6pm go to [email or web address] - password [email or web address] [facility]  Office  [phone]  CC: Primary care physician; Jerrol Banana., MD  Note: This dictation was prepared with Dragon dictation along with smaller phrase technology. Any transcriptional errors that result from this process are unintentional.

## 2017-05-08 NOTE — Progress Notes (Signed)
Pt able to eat 50% of dinner, feeling better overall, frequency of BM's has decreased. Continues with mild nausea, pain is better controlled, now on long-acting pain med.

## 2017-05-08 NOTE — Progress Notes (Signed)
Pt c/o pain and restlessness on the left leg. PRN pain medicine not due until 0500. Paged and spoke to Dr. Estanislado Pandy, ordered one time dose Oxycodone 5mg  PO. Will administer as ordered.

## 2017-05-08 NOTE — Progress Notes (Deleted)
Pharmacy Antibiotic Note  Jerry Parsons is a 66 y.o. male admitted on 05/07/2017 with pneumonia.  Pharmacy has been consulted for ceftriaxone/azithromycin dosing.  Plan: Will initiate ceftriaxone 2g IV and azithromycin 500 mg IV daily  Weight: 267 lb 12.8 oz (121.5 kg)  Temp (24hrs), Avg:102.4 F (39.1 C), Min:101.4 F (38.6 C), Max:103.2 F (39.6 C)   Recent Labs Lab 05/07/17 2034  WBC 10.6  CREATININE 1.71*  LATICACIDVEN 2.1*    Estimated Creatinine Clearance: 55.6 mL/min (A) (by C-G formula based on SCr of 1.71 mg/dL (H)).    Allergies  Allergen Reactions  . Carisoprodol Itching     Thank you for allowing pharmacy to be a part of this patient's care.  Tobie Lords, PharmD, BCPS Clinical Pharmacist 05/08/2017

## 2017-05-08 NOTE — Progress Notes (Signed)
New Admission Note:   Arrival Method: per stretcher from ED, pt came from home Mental Orientation: alert and oriented X4 Telemetry: placed on telebox [id number], CCMD notified, verified with [place], NT Assessment: Completed Skin: warm, dry, intact, no preexisting wound/sore noted IV: G20 on right and left AC, both with transparent dressing, intact Pain: 9/10 scale, lower back pain. Will administer PRN pain medicine Tubes: O2 inhalation at 2L/min.-acute Safety Measures: Safety Fall Prevention Plan has been given and discussed Admission: Completed 1A Orientation: Patient has been oriented to the room, unit and staff.  Family: wife Rise Paganini at bedside  Orders have been reviewed and implemented. Will continue to monitor patient. Call light has been placed within reach and bed alarm has been activated.   Georgeanna Harrison BSN, RN [facility] 1A

## 2017-05-09 ENCOUNTER — Other Ambulatory Visit: Payer: Self-pay | Admitting: Family Medicine

## 2017-05-09 DIAGNOSIS — E1165 Type 2 diabetes mellitus with hyperglycemia: Secondary | ICD-10-CM

## 2017-05-09 DIAGNOSIS — IMO0002 Reserved for concepts with insufficient information to code with codable children: Secondary | ICD-10-CM

## 2017-05-09 DIAGNOSIS — Z794 Long term (current) use of insulin: Principal | ICD-10-CM

## 2017-05-09 DIAGNOSIS — E118 Type 2 diabetes mellitus with unspecified complications: Principal | ICD-10-CM

## 2017-05-09 LAB — CBC WITH DIFFERENTIAL/PLATELET
Basophils Absolute: 0 10*3/uL (ref 0–0.1)
Basophils Relative: 0 %
Eosinophils Absolute: 0 10*3/uL (ref 0–0.7)
Eosinophils Relative: 1 %
HCT: 34 % — ABNORMAL LOW (ref 40.0–52.0)
Hemoglobin: 11.5 g/dL — ABNORMAL LOW (ref 13.0–18.0)
Lymphocytes Relative: 20 %
Lymphs Abs: 1.1 10*3/uL (ref 1.0–3.6)
MCH: 27 pg (ref 26.0–34.0)
MCHC: 33.7 g/dL (ref 32.0–36.0)
MCV: 80 fL (ref 80.0–100.0)
Monocytes Absolute: 0.4 10*3/uL (ref 0.2–1.0)
Monocytes Relative: 8 %
Neutro Abs: 4 10*3/uL (ref 1.4–6.5)
Neutrophils Relative %: 71 %
Platelets: 104 10*3/uL — ABNORMAL LOW (ref 150–440)
RBC: 4.25 MIL/uL — ABNORMAL LOW (ref 4.40–5.90)
RDW: 17.2 % — ABNORMAL HIGH (ref 11.5–14.5)
WBC: 5.5 10*3/uL (ref 3.8–10.6)

## 2017-05-09 LAB — BASIC METABOLIC PANEL
Anion gap: 8 (ref 5–15)
BUN: 20 mg/dL (ref 6–20)
CO2: 29 mmol/L (ref 22–32)
Calcium: 8.3 mg/dL — ABNORMAL LOW (ref 8.9–10.3)
Chloride: 101 mmol/L (ref 101–111)
Creatinine, Ser: 1.22 mg/dL (ref 0.61–1.24)
GFR calc Af Amer: >60 mL/min (ref >60)
GFR calc non Af Amer: 60 mL/min — ABNORMAL LOW (ref >60)
Glucose, Bld: 147 mg/dL — ABNORMAL HIGH (ref 65–99)
Potassium: 3.1 mmol/L — ABNORMAL LOW (ref 3.5–5.1)
Sodium: 138 mmol/L (ref 135–145)

## 2017-05-09 LAB — GLUCOSE, CAPILLARY
Glucose-Capillary: 136 mg/dL — ABNORMAL HIGH (ref 65–99)
Glucose-Capillary: 189 mg/dL — ABNORMAL HIGH (ref 65–99)
Glucose-Capillary: 111 mg/dL — ABNORMAL HIGH (ref 65–99)
Glucose-Capillary: 142 mg/dL — ABNORMAL HIGH (ref 65–99)

## 2017-05-09 LAB — HIV ANTIBODY (ROUTINE TESTING W REFLEX): HIV Screen 4th Generation wRfx: NONREACTIVE

## 2017-05-09 MED ORDER — POTASSIUM CHLORIDE CRYS ER 20 MEQ PO TBCR
40.0000 meq | EXTENDED_RELEASE_TABLET | Freq: Two times a day (BID) | ORAL | Status: AC
  Administered 2017-05-09 – 2017-05-10 (×3): 40 meq via ORAL
  Filled 2017-05-09 (×3): qty 2

## 2017-05-09 NOTE — Plan of Care (Signed)
Problem: Pain Managment: Goal: General experience of comfort will improve Outcome: Progressing Patient reports chronic pain is controlled on current medication regimen

## 2017-05-09 NOTE — Care Management Note (Signed)
Case Management Note  Patient Details  Name: GEROME KOKESH MRN: [id number] Date of Birth: 1951/04/26  Subjective/Objective:  Met with patient to discuss discharge planning. Wife at bedside.  Discussed  PT recommendations. [place] refused any home PT services or home health. Offered to assist in anyway possible. Patient declined                 Action/Plan: Case closed  Expected Discharge Date:                  Expected Discharge Plan:  Home/Self Care  In-House Referral:     Discharge planning Services  CM Consult  Post Acute Care Choice:    Choice offered to:  Patient, Spouse  DME Arranged:    DME Agency:     HH Arranged:  Patient Refused [place] Agency:     Status of Service:  Completed, signed off  If discussed at [place] of Stay Meetings, dates discussed:    Additional Comments:  Jolly Mango, RN 05/09/2017, 1:56 PM

## 2017-05-09 NOTE — Evaluation (Signed)
Physical Therapy Evaluation Patient Details Name: Jerry Parsons MRN: [id number] DOB: July 01, 1951 Today's Date: 05/09/2017   History of Present Illness  Pt is a 66 y.o.malewith a known history of afib, OA, BPH, DM, DVT/PE, campylobacter infection in 2014, GERD, HLD, HTN, CKD, seizures, OSA, TIApresents to the emergency department for evaluation of fever. Patient was in a usual state of health until two days Ago when he developed diarrhea described as profuse, watery, dark and foul-smelling, similar to episode of Campylobacter that he had for years ago. He was hospitalized and treated twice for the same infection. He reports symptoms of fevers, crampy abdominal pain with nausea but no vomiting. He has not had any recent antibiotics. Patient denies, weakness, dizziness, chest pain, shortness of breath, dysuria/frequency, changes in mental status. Current assessment includes sepsis with possible GI infection and acute kidney injury.     Clinical Impression  Pt presents with mild deficits in strength, transfers, gait, balance, and activity tolerance.  Pt Ind with bed mobility tasks and SBA with transfers with good initial stability.  Pt able to Amb 50' with RW and SBA with SpO2 89%, HR 92 bpm after amb on room air compared to 85 bpm and 93% at baseline.  Both returned to near baseline levels after sitting 30-45 sec.  Pt will benefit from PT services to address above deficits for decreased caregiver assistance upon discharge and will benefit from HHPT/OPPT for continued progress towards PLOF.       Follow Up Recommendations Home health PT VS OPPT depending on transportation availability     Equipment Recommendations  None recommended by PT    Recommendations for Other Services       Precautions / Restrictions Precautions Precautions: Fall Restrictions Weight Bearing Restrictions: No      Mobility  Bed Mobility Overal bed mobility: Independent                Transfers Overall  transfer level: Needs assistance Equipment used: Rolling walker (2 wheeled) Transfers: Sit to/from Stand Sit to Stand: Supervision            Ambulation/Gait Ambulation/Gait assistance: Supervision Ambulation Distance (Feet): 50 Feet Assistive device: Rolling walker (2 wheeled) Gait Pattern/deviations: WFL(Within Functional Limits)   Gait velocity interpretation: Below normal speed for age/gender General Gait Details: SpO2 89%, HR 92 bpm after amb 50' on room air compared to 85 bpm and 93% at baseline.  Both returned to near baseline levels after sitting 30-45 sec.  Stairs            Wheelchair Mobility    Modified Rankin (Stroke Patients Only)       Balance Overall balance assessment: Needs assistance   Sitting balance-Leahy Scale: Normal     Standing balance support: No upper extremity supported Standing balance-Leahy Scale: Good                               Pertinent Vitals/Pain Pain Assessment: 0-10 Pain Score: 8  Pain Location: B knees, back; both chronic per pt with pt planning on B TKR in the near future Pain Descriptors / Indicators: Aching;Sore Pain Intervention(s): Premedicated before session;Monitored during session;Limited activity within patient's tolerance    Home Living Family/patient expects to be discharged to:: Private residence Living Arrangements: Spouse/significant other Available Help at Discharge: Family;Available 24 hours/day Type of Home: House Home Access: Stairs to enter Entrance Stairs-Rails: None Entrance Stairs-Number of Steps: 1 Home Layout: One level Home  Equipment: Kasandra Knudsen - quad;Cane - single point;Walker - 2 wheels;Crutches      Prior Function Level of Independence: Independent         Comments: Ind Amb without AD community distances, no fall history, Ind with ADLs     Hand Dominance   Dominant Hand: Right    Extremity/Trunk Assessment   Upper Extremity Assessment Upper Extremity Assessment:  Overall WFL for tasks assessed    Lower Extremity Assessment Lower Extremity Assessment: Generalized weakness       Communication   Communication: No difficulties  Cognition Arousal/Alertness: Awake/alert Behavior During Therapy: WFL for tasks assessed/performed Overall Cognitive Status: Within Functional Limits for tasks assessed                                        General Comments      Exercises Total Joint Exercises Ankle Circles/Pumps: AROM;Both;5 reps;10 reps Quad Sets: Strengthening;Both;10 reps Gluteal Sets: Strengthening;Both;10 reps Heel Slides: AROM;Both;10 reps Hip ABduction/ADduction: AROM;Both;10 reps Straight Leg Raises: AROM;Both;10 reps Other Exercises Other Exercises: Static balance training with feet apart and together with eyes open/closed, and head still/head turns with good stability grossly   Assessment/Plan    PT Assessment Patient needs continued PT services  PT Problem List Decreased strength;Decreased activity tolerance;Decreased mobility;Decreased balance       PT Treatment Interventions Gait training;Stair training;Functional mobility training;Therapeutic activities;Therapeutic exercise;Balance training;Neuromuscular re-education;Patient/family education;DME instruction    PT Goals (Current goals can be found in the Care Plan section)  Acute Rehab PT Goals Patient Stated Goal: To "get back home" PT Goal Formulation: With patient Time For Goal Achievement: 05/22/17 Potential to Achieve Goals: Good    Frequency Min 2X/week   Barriers to discharge        Co-evaluation               AM-PAC PT "6 Clicks" Daily Activity  Outcome Measure Difficulty turning over in bed (including adjusting bedclothes, sheets and blankets)?: None Difficulty moving from lying on back to sitting on the side of the bed? : None Difficulty sitting down on and standing up from a chair with arms (e.g., wheelchair, bedside commode, etc,.)?:  A Little Help needed moving to and from a bed to chair (including a wheelchair)?: A Little Help needed walking in hospital room?: A Little Help needed climbing 3-5 steps with a railing? : A Little 6 Click Score: 20    End of Session Equipment Utilized During Treatment: Gait belt Activity Tolerance: Patient limited by fatigue Patient left: in bed;with bed alarm set;with call bell/phone within reach   PT Visit Diagnosis: Muscle weakness (generalized) (M62.81);Difficulty in walking, not elsewhere classified (R26.2)    Time: 1015-1050 PT Time Calculation (min) (ACUTE ONLY): 35 min   Charges:   PT Evaluation $PT Eval Low Complexity: 1 Procedure PT Treatments $Therapeutic Exercise: 8-22 mins   PT G Codes:        DRoyetta Asal PT, DPT 05/09/17, 12:22 PM

## 2017-05-09 NOTE — Progress Notes (Signed)
[place] [facility] [place] NAME: Jerry Parsons    MR#:  [id number]  DATE OF BIRTH:  [birth year]  SUBJECTIVE:  CHIEF COMPLAINT:   Chief Complaint  Patient presents with  . Diarrhea  . Fever   Continues to have diarrhea. No fever at this time. No abdominal pain. No blood in stool.   Stool is little forming up now, but still almost once every hour as per pt.  REVIEW OF SYSTEMS:    Review of Systems  Constitutional: Positive for malaise/fatigue. Negative for chills and fever.  HENT: Negative for sore throat.   Eyes: Negative for blurred vision, double vision and pain.  Respiratory: Negative for cough, hemoptysis, shortness of breath and wheezing.   Cardiovascular: Negative for chest pain, palpitations, orthopnea and leg swelling.  Gastrointestinal: Positive for diarrhea. Negative for abdominal pain, constipation, heartburn, nausea and vomiting.  Genitourinary: Negative for dysuria and hematuria.  Musculoskeletal: Positive for joint pain. Negative for back pain.  Skin: Negative for rash.  Neurological: Positive for weakness. Negative for sensory change, speech change, focal weakness and headaches.  Endo/Heme/Allergies: Does not bruise/bleed easily.  Psychiatric/Behavioral: Negative for depression. The patient is not nervous/anxious.     DRUG ALLERGIES:   Allergies  Allergen Reactions  . Carisoprodol Itching    VITALS:  Blood pressure 126/60, pulse 82, temperature 99 F (37.2 C), temperature source Oral, resp. rate 18, height 5\' 11"  (1.803 m), weight 121.5 kg (267 lb 12.8 oz), SpO2 100 %.  PHYSICAL EXAMINATION:   Physical Exam  GENERAL:  66 y.o.-year-old patient lying in the bed with no acute distress.  EYES: Pupils equal, round, reactive to light and accommodation. No scleral icterus. Extraocular muscles intact.  HEENT: Head atraumatic, normocephalic. Oropharynx and nasopharynx clear.  NECK:  Supple, no jugular venous distention. No  thyroid enlargement, no tenderness.  LUNGS: Normal breath sounds bilaterally, no wheezing, rales, rhonchi. No use of accessory muscles of respiration.  CARDIOVASCULAR: S1, S2 normal. No murmurs, rubs, or gallops.  ABDOMEN: Soft, Mild tenderness, nondistended. Bowel sounds present. No organomegaly or mass.  EXTREMITIES: No cyanosis, clubbing or edema b/l.    NEUROLOGIC: Cranial nerves II through XII are intact. No focal Motor or sensory deficits b/l.   PSYCHIATRIC: The patient is alert and oriented x 3.  SKIN: No obvious rash, lesion, or ulcer.   LABORATORY PANEL:   CBC  Recent Labs Lab 05/09/17 0342  WBC 5.5  HGB 11.5*  HCT 34.0*  PLT 104*   ------------------------------------------------------------------------------------------------------------------ Chemistries   Recent Labs Lab 05/07/17 2034 05/08/17 0026  05/09/17 0342  NA 135  --   < > 138  K 3.8  --   < > 3.1*  CL 96*  --   < > 101  CO2 29  --   < > 29  GLUCOSE 206*  --   < > 147*  BUN 35*  --   < > 20  CREATININE 1.71*  --   < > 1.22  CALCIUM 8.8*  --   < > 8.3*  MG  --  1.8  --   --   AST 34  --   --   --   ALT 27  --   --   --   ALKPHOS 68  --   --   --   BILITOT 0.8  --   --   --   < > = values in this interval not displayed. ------------------------------------------------------------------------------------------------------------------  Cardiac Enzymes No  results for input(s): TROPONINI in the last 168 hours. ------------------------------------------------------------------------------------------------------------------  RADIOLOGY:  Dg Chest Portable 1 View  Result Date: 05/07/2017 CLINICAL DATA:  Fever and cough. EXAM: PORTABLE CHEST 1 VIEW COMPARISON:  09/08/2015 prior exams FINDINGS: Upper limits normal heart size again noted. There is no evidence of focal airspace disease, pulmonary edema, suspicious pulmonary nodule/mass, pleural effusion, or pneumothorax. No acute bony abnormalities are  identified. IMPRESSION: No active disease. Electronically Signed   By: Margarette Canada M.D.   On: 05/07/2017 21:53     ASSESSMENT AND PLAN:   * Non-typhoid Salmonella diarrhea with sepsis present on admission Continues to have significant watery diarrhea. Will increase fluids and add potassium. Stop Zosyn and vancomycin. If diarrhea as it does not improve by tomorrow or patient has fever will start ciprofloxacin. No abdominal pain. Afebrile at this time.  stool is slowly forming up now as per pt.  * Acute kidney injury due to dehydration and sepsis. Increase IV fluid rate. Monitor input and output. Repeat labs in the morning.  * Diabetes mellitus. Sliding scale insulin.  * Hypokalemia   IV and oral replacement.  All the records are reviewed and case discussed with Care Management/Social Workerr. Management plans discussed with the patient, family and they are in agreement.  CODE STATUS:  FULL CODE  DVT Prophylaxis: SCDs  TOTAL TIME TAKING CARE OF THIS PATIENT: 35 minutes.   POSSIBLE D/C IN 1-2 DAYS, DEPENDING ON CLINICAL CONDITION.  Vaughan Basta M.D on 05/09/2017 at 2:53 PM  Between 7am to 6pm - Pager - [phone]  After 6pm go to [email or web address] - password [email or web address] [facility]  Office  [phone]  CC: Primary care physician; Jerrol Banana., MD  Note: This dictation was prepared with Dragon dictation along with smaller phrase technology. Any transcriptional errors that result from this process are unintentional.

## 2017-05-10 LAB — GLUCOSE, CAPILLARY
Glucose-Capillary: 105 mg/dL — ABNORMAL HIGH (ref 65–99)
Glucose-Capillary: 153 mg/dL — ABNORMAL HIGH (ref 65–99)

## 2017-05-10 LAB — BASIC METABOLIC PANEL
Anion gap: 6 (ref 5–15)
BUN: 13 mg/dL (ref 6–20)
CO2: 28 mmol/L (ref 22–32)
Calcium: 8.5 mg/dL — ABNORMAL LOW (ref 8.9–10.3)
Chloride: 104 mmol/L (ref 101–111)
Creatinine, Ser: 1.04 mg/dL (ref 0.61–1.24)
GFR calc Af Amer: >60 mL/min (ref >60)
GFR calc non Af Amer: >60 mL/min (ref >60)
Glucose, Bld: 163 mg/dL — ABNORMAL HIGH (ref 65–99)
Potassium: 3.7 mmol/L (ref 3.5–5.1)
Sodium: 138 mmol/L (ref 135–145)

## 2017-05-10 MED ORDER — INSULIN ASPART 100 UNIT/ML FLEXPEN: PEN_INJECTOR | SUBCUTANEOUS | 3 refills | Status: DC

## 2017-05-10 MED ORDER — FUROSEMIDE 40 MG PO TABS: 40.0000 mg | ORAL_TABLET | Freq: Every day | ORAL | 0 refills | Status: DC

## 2017-05-10 MED ORDER — AMIODARONE HCL 200 MG PO TABS: ORAL_TABLET | ORAL | 3 refills | Status: DC

## 2017-05-10 NOTE — Discharge Summary (Signed)
[facility] NAME: Jerry Parsons    MR#:  [id number]  DATE OF BIRTH:  Dec 30, 1950  DATE OF ADMISSION:  05/07/2017 ADMITTING PHYSICIAN: Harvie Bridge, DO  DATE OF DISCHARGE: 05/10/2017  PRIMARY CARE PHYSICIAN: Jerrol Banana., MD    ADMISSION DIAGNOSIS:  Sepsis, due to unspecified organism ([place]) [A41.9]  DISCHARGE DIAGNOSIS:  Active Problems:   Sepsis due to pneumonia ([place])   Sepsis ([place])    Salmonella diarrhea  SECONDARY DIAGNOSIS:   Past Medical History:  Diagnosis Date  . Arrhythmia    tachycardia, A-Fib  . Arthritis   . Blind right eye   . BPH (benign prostatic hyperplasia)   . Diabetes mellitus   . DVT (deep venous thrombosis) ([place]) 02/2010  . Dysrhythmia   . 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 GERD (gastroesophageal reflux disease)   . Hypercholesteremia   . Hypertension   . Kidney failure   . Neuropathy   . Pneumonia    time 9 ;last episode 12/2015  . Pulmonary embolus ([place]) 2011  . Seasonal allergies   . Seizures ([place])    as child   . Sleep apnea    BIPAP  . Stiff neck    limited turning s/p titanium plate placement  . TIA (transient ischemic attack)   . Wears dentures    full upper and lower    HOSPITAL COURSE:   * Non-typhoid Salmonella diarrhea with sepsis present on admission Had significant watery diarrhea. Given IV fluids and add potassium. Stop Zosyn and vancomycin.  No abdominal pain. Afebrile at this time.  stool is slowly forming up now as per pt.   Had only One BM in last 24 hrs, tolerating food. D/c home.  * Acute kidney injury due to dehydration and sepsis. Increase IV fluid rate. Monitor input and output. Repeat labs in the morning.   Improved.  * Diabetes mellitus. Sliding scale insulin.  * Hypokalemia   IV and oral replacement.   Improved.   DISCHARGE CONDITIONS:   Stable.  CONSULTS OBTAINED:    DRUG ALLERGIES:   Allergies   Allergen Reactions  . Carisoprodol Itching    DISCHARGE MEDICATIONS:   Current Discharge Medication List    CONTINUE these medications which have CHANGED   Details  amiodarone (PACERONE) 200 MG tablet TAKE 1 TABLET (200 MG TOTAL) BY MOUTH DAILY. Qty: 180 tablet, Refills: 3    furosemide (LASIX) 40 MG tablet Take 1 tablet (40 mg total) by mouth daily. Qty: 30 tablet, Refills: 0    insulin aspart (NOVOLOG FLEXPEN) 100 UNIT/ML FlexPen INJECT 14 UNITS 3 TIMES A   DAY (MORNING, NOON, & NIGHT) Qty: 15 mL, Refills: 3      CONTINUE these medications which have NOT CHANGED   Details  acetaminophen (TYLENOL) 500 MG tablet Take 500 mg by mouth every 6 (six) hours as needed (for headache.).    aspirin EC 81 MG tablet Take 81 mg by mouth at bedtime.    budesonide (RHINOCORT AQUA) 32 MCG/ACT nasal spray Place 1 spray into the nose daily as needed (for allergies.).     cyclobenzaprine (FLEXERIL) 10 MG tablet TAKE 1 TABLET BY MOUTH 3 TIMES A DAY Qty: 90 tablet, Refills: 12    dutasteride (AVODART) 0.5 MG capsule Take 1 capsule (0.5 mg total) by mouth daily. Qty: 90 capsule, Refills: 3    esomeprazole (NEXIUM) 40 MG capsule Take 1 capsule (40  mg total) by mouth 2 (two) times daily. Qty: 90 capsule, Refills: 3    fexofenadine (ALLEGRA) 180 MG tablet Take 180 mg by mouth daily.      gabapentin (NEURONTIN) 800 MG tablet TAKE 1 TABLET 3 TIMES A DAY Qty: 270 tablet, Refills: 3   Associated Diagnoses: Neuropathy    glucose blood (ONE TOUCH ULTRA TEST) test strip Check sugar twice daily. Dx- E11.8 Qty: 100 each, Refills: 3   Associated Diagnoses: Uncontrolled type 2 diabetes mellitus with complication, with long-term current use of insulin (HCC)    hydrochlorothiazide (HYDRODIURIL) 12.5 MG tablet TAKE 2 TABLETS (25 MG TOTAL) BY MOUTH AT BEDTIME. Qty: 90 tablet, Refills: 3    Insulin Pen Needle (PEN NEEDLES) 31G X 5 MM MISC 1 each by Does not apply route daily. PATIENT NEEDS NOVA TWIST  NEEDLES 5 MM  DX E11.9 Qty: 200 each, Refills: 3    LANTUS SOLOSTAR 100 UNIT/ML Solostar Pen Inject 35 Units into the skin 2 (two) times daily.     magnesium oxide (MAG-OX) 400 (241.3 Mg) MG tablet TAKE 1 TABLET (400 MG TOTAL) BY MOUTH 2 (TWO) TIMES DAILY. Qty: 180 tablet, Refills: 3    metFORMIN (GLUCOPHAGE) 1000 MG tablet TAKE 1 TABLET (1,000 MG TOTAL) BY MOUTH 2 (TWO) TIMES DAILY.    metoprolol (LOPRESSOR) 100 MG tablet TAKE 1 TABLET (100 MG TOTAL) BY MOUTH 2 (TWO) TIMES DAILY. Qty: 60 tablet, Refills: 11    mometasone (ELOCON) 0.1 % cream Apply 1 application topically daily. For 2 weeks Qty: 45 g, Refills: 0    Multiple Vitamin (MULTIVITAMIN WITH MINERALS) TABS tablet Take 1 tablet by mouth daily. Centrum Silver.    Omega-3 Fatty Acids (FISH OIL) 1200 MG CAPS Take 1,200 mg by mouth 2 (two) times daily.    oxyCODONE (OXYCONTIN) 40 mg 12 hr tablet Take 1 tablet (40 mg total) by mouth every 8 (eight) hours as needed. Qty: 270 tablet, Refills: 0   Associated Diagnoses: Chronic back pain, unspecified back location, unspecified back pain laterality    pregabalin (LYRICA) 50 MG capsule Take 1 capsule (50 mg total) by mouth 3 (three) times daily. Qty: 270 capsule, Refills: 1   Associated Diagnoses: Neuropathy    ranitidine (ZANTAC) 300 MG tablet TAKE 1 TABLET BY MOUTH EVERY DAY Qty: 90 tablet, Refills: 3    sitaGLIPtin (JANUVIA) 100 MG tablet Take 1 tablet (100 mg total) by mouth daily. Qty: 90 tablet, Refills: 3    testosterone cypionate (DEPOTESTOSTERONE CYPIONATE) 200 MG/ML injection Inject 200 mg into the muscle every 21 ( twenty-one) days.     VICTOZA 18 MG/3ML SOPN INJECT 0.3ML (=1.8MG ) INTO THE SKIN DAILY Qty: 27 mL, Refills: 3      STOP taking these medications     oseltamivir (TAMIFLU) 75 MG capsule          DISCHARGE INSTRUCTIONS:    Follow with PMD in 1-2 weeks.  If you experience worsening of your admission symptoms, develop shortness of breath, life  threatening emergency, suicidal or homicidal thoughts you must seek medical attention immediately by calling 911 or calling your MD immediately  if symptoms less severe.  You Must read complete instructions/literature along with all the possible adverse reactions/side effects for all the Medicines you take and that have been prescribed to you. Take any new Medicines after you have completely understood and accept all the possible adverse reactions/side effects.   Please note  You were cared for by a hospitalist during your hospital stay.  If you have any questions about your discharge medications or the care you received while you were in the hospital after you are discharged, you can call the unit and asked to speak with the hospitalist on call if the hospitalist that took care of you is not available. Once you are discharged, your primary care physician will handle any further medical issues. Please note that NO REFILLS for any discharge medications will be authorized once you are discharged, as it is imperative that you return to your primary care physician (or establish a relationship with a primary care physician if you do not have one) for your aftercare needs so that they can reassess your need for medications and monitor your lab values.    Today   CHIEF COMPLAINT:   Chief Complaint  Patient presents with  . Diarrhea  . Fever    HISTORY OF PRESENT ILLNESS:  Jerry Parsons  is a 66 y.o. male with a known history of afib, OA, BPH, DM, DVT/PE, campylobacter infection in 2014, GERD, HLD, HTN, CKD, seizures, OSA, TIA presents to the emergency department for evaluation of fever.  Patient was in a usual state of health until two days  Ago when he developed diarrhea described as profuse, watery, dark and foul-smelling, similar to episode of Campylobacter that he had for years ago. He was hospitalized and treated twice for the same infection.  He reports symptoms of fevers, crampy abdominal pain  with nausea but no vomiting. He has not had any recent antibiotics.  Patient denies, weakness, dizziness, chest pain, shortness of breath, dysuria/frequency, changes in mental status.    Of note patient is chronically short of breath. Otherwise there has been no change in status. Patient has been taking medication as prescribed and there has been no recent change in medication or diet.  No recent antibiotics.  There has been no recent illness, hospitalizations, travel. [name] His wife was sick with influenza a one week ago.     VITAL SIGNS:  Blood pressure 134/60, pulse 78, temperature 98.7 F (37.1 C), temperature source Oral, resp. rate 20, height 5\' 11"  (1.803 m), weight 121.5 kg (267 lb 12.8 oz), SpO2 95 %.  I/O:   Intake/Output Summary (Last 24 hours) at 05/10/17 1026 Last data filed at 05/10/17 0750  Gross per 24 hour  Intake          4439.17 ml  Output                0 ml  Net          4439.17 ml    PHYSICAL EXAMINATION:   GENERAL:  66 y.o.-year-old patient lying in the bed with no acute distress.  EYES: Pupils equal, round, reactive to light and accommodation. No scleral icterus. Extraocular muscles intact.  HEENT: Head atraumatic, normocephalic. Oropharynx and nasopharynx clear.  NECK:  Supple, no jugular venous distention. No thyroid enlargement, no tenderness.  LUNGS: Normal breath sounds bilaterally, no wheezing, rales, rhonchi. No use of accessory muscles of respiration.  CARDIOVASCULAR: S1, S2 normal. No murmurs, rubs, or gallops.  ABDOMEN: Soft, Mild tenderness, nondistended. Bowel sounds present. No organomegaly or mass.  EXTREMITIES: No cyanosis, clubbing or edema b/l.    NEUROLOGIC: Cranial nerves II through XII are intact. No focal Motor or sensory deficits b/l.   PSYCHIATRIC: The patient is alert and oriented x 3.  SKIN: No obvious rash, lesion, or ulcer.   DATA REVIEW:   CBC  Recent Labs Lab 05/09/17 0342  WBC  5.5  HGB 11.5*  HCT 34.0*  PLT 104*     Chemistries   Recent Labs Lab 05/07/17 2034 05/08/17 0026  05/10/17 0948  NA 135  --   < > 138  K 3.8  --   < > 3.7  CL 96*  --   < > 104  CO2 29  --   < > 28  GLUCOSE 206*  --   < > 163*  BUN 35*  --   < > 13  CREATININE 1.71*  --   < > 1.04  CALCIUM 8.8*  --   < > 8.5*  MG  --  1.8  --   --   AST 34  --   --   --   ALT 27  --   --   --   ALKPHOS 68  --   --   --   BILITOT 0.8  --   --   --   < > = values in this interval not displayed.  Cardiac Enzymes No results for input(s): TROPONINI in the last 168 hours.  Microbiology Results  Results for orders placed or performed during the hospital encounter of 05/07/17  Blood Culture (routine x 2)     Status: None (Preliminary result)   Collection Time: 05/07/17  8:34 PM  Result Value Ref Range Status   Specimen Description BLOOD RIGHT FOREARM  Final   Special Requests   Final    BOTTLES DRAWN AEROBIC AND ANAEROBIC Blood Culture adequate volume   Culture NO GROWTH 3 DAYS  Final   Report Status PENDING  Incomplete  Blood Culture (routine x 2)     Status: None (Preliminary result)   Collection Time: 05/07/17  8:50 PM  Result Value Ref Range Status   Specimen Description BLOOD RIGHT ANTECUBITAL  Final   Special Requests   Final    BOTTLES DRAWN AEROBIC AND ANAEROBIC Blood Culture results may not be optimal due to an inadequate volume of blood received in culture bottles   Culture NO GROWTH 3 DAYS  Final   Report Status PENDING  Incomplete  Culture, blood (routine x 2) Call MD if unable to obtain prior to antibiotics being given     Status: None (Preliminary result)   Collection Time: 05/08/17 12:26 AM  Result Value Ref Range Status   Specimen Description BLOOD RIGHT ARM  Final   Special Requests   Final    BOTTLES DRAWN AEROBIC AND ANAEROBIC Blood Culture adequate volume   Culture NO GROWTH 2 DAYS  Final   Report Status PENDING  Incomplete  Culture, blood (routine x 2) Call MD if unable to obtain prior to antibiotics  being given     Status: None (Preliminary result)   Collection Time: 05/08/17 12:40 AM  Result Value Ref Range Status   Specimen Description BLOOD LEFT HAND  Final   Special Requests   Final    BOTTLES DRAWN AEROBIC AND ANAEROBIC Blood Culture adequate volume Immunocompromised   Culture NO GROWTH 2 DAYS  Final   Report Status PENDING  Incomplete  Gastrointestinal Panel by PCR , Stool     Status: Abnormal   Collection Time: 05/08/17  2:03 AM  Result Value Ref Range Status   Campylobacter species NOT DETECTED NOT DETECTED Final   Plesimonas shigelloides NOT DETECTED NOT DETECTED Final   Salmonella species DETECTED (A) NOT DETECTED Final    Comment: RESULT CALLED TO, READ BACK BY AND VERIFIED WITH: ANESSA MACROHON AT  [id number] 05/08/17.PMH    Yersinia enterocolitica NOT DETECTED NOT DETECTED Final   Vibrio species NOT DETECTED NOT DETECTED Final   Vibrio cholerae NOT DETECTED NOT DETECTED Final   Enteroaggregative E coli (EAEC) NOT DETECTED NOT DETECTED Final   Enteropathogenic E coli (EPEC) NOT DETECTED NOT DETECTED Final   Enterotoxigenic E coli (ETEC) NOT DETECTED NOT DETECTED Final   Shiga like toxin producing E coli (STEC) NOT DETECTED NOT DETECTED Final   Shigella/Enteroinvasive E coli (EIEC) NOT DETECTED NOT DETECTED Final   Cryptosporidium NOT DETECTED NOT DETECTED Final   Cyclospora cayetanensis NOT DETECTED NOT DETECTED Final   Entamoeba histolytica NOT DETECTED NOT DETECTED Final   Giardia lamblia NOT DETECTED NOT DETECTED Final   Adenovirus F40/41 NOT DETECTED NOT DETECTED Final   Astrovirus NOT DETECTED NOT DETECTED Final   Norovirus GI/GII NOT DETECTED NOT DETECTED Final   Rotavirus A NOT DETECTED NOT DETECTED Final   Sapovirus (I, II, IV, and V) NOT DETECTED NOT DETECTED Final  C difficile quick scan w PCR reflex     Status: None   Collection Time: 05/08/17  3:24 AM  Result Value Ref Range Status   C Diff antigen NEGATIVE NEGATIVE Final   C Diff toxin NEGATIVE NEGATIVE  Final   C Diff interpretation No C. difficile detected.  Final    RADIOLOGY:  No results found.  EKG:   Orders placed or performed during the hospital encounter of 05/07/17  . ED EKG 12-Lead  . ED EKG 12-Lead      Management plans discussed with the patient, family and they are in agreement.  CODE STATUS:     Code Status Orders        Start     Ordered   05/08/17 0003  Full code  Continuous     05/08/17 0002    Code Status History    Date Active Date Inactive Code Status Order ID Comments User Context   09/08/2015  9:07 PM 09/09/2015  6:04 PM Full Code [id number]  Demetrios Loll, MD Inpatient    Advance Directive Documentation     Most Recent Value  Type of Advance Directive  Healthcare Power of [place], Living will  Pre-existing out of facility DNR order (yellow form or pink MOST form)  -  "MOST" Form in Place?  -      TOTAL TIME TAKING CARE OF THIS PATIENT: 35 minutes.    Vaughan Basta M.D on 05/10/2017 at 10:26 AM  Between 7am to 6pm - Pager - [phone]  After 6pm go to [email or web address] - password [email or web address] [facility]  Office  [phone]  CC: Primary care physician; Jerrol Banana., MD   Note: This dictation was prepared with Dragon dictation along with smaller phrase technology. Any transcriptional errors that result from this process are unintentional.

## 2017-05-10 NOTE — Patient Instructions (Addendum)
Jerry Parsons  05/10/2017   Your procedure is scheduled on: 05-16-17  Report to [facility] Main  Entrance Report to Admitting at 11:05 AM   Call this number if you have problems the morning of surgery  [phone]   Remember: ONLY 1 PERSON MAY GO WITH YOU TO SHORT STAY TO GET  READY MORNING OF [place].  Do not eat food or drink liquids :After Midnight.     Take these medicines the morning of surgery with A SIP OF WATER: Amiodarone (Pacerone), Esomeprazole (Nexium), Gabapentin (Neurontin), Metoprolol,  Ranitidine (Zantac). You may also bring and use your nasal spray  DO NOT TAKE ANY DIABETIC MEDICATIONS DAY OF YOUR SURGERY                               You may not have any metal on your body including hair pins and              piercings  Do not wear jewelry, make-up, lotions, powders or perfumes, deodorant             Men may shave face and neck.   Do not bring valuables to the hospital. [place].  Contacts, dentures or bridgework may not be worn into surgery.  Leave suitcase in the car. After surgery it may be brought to your room.                Please read over the following fact sheets you were given: _____________________________________________________________________  How to Manage Your Diabetes Before and After Surgery  Why is it important to control my blood sugar before and after surgery? . Improving blood sugar levels before and after surgery helps healing and can limit problems. . A way of improving blood sugar control is eating a healthy diet by: o  Eating less sugar and carbohydrates o  Increasing activity/exercise o  Talking with your doctor about reaching your blood sugar goals . High blood sugars (greater than 180 mg/dL) can raise your risk of infections and slow your recovery, so you will need to focus on controlling your diabetes during the weeks before surgery. . Make sure that  the doctor who takes care of your diabetes knows about your planned surgery including the date and location.  How do I manage my blood sugar before surgery? . Check your blood sugar at least 4 times a day, starting 2 days before surgery, to make sure that the level is not too high or low. o Check your blood sugar the morning of your surgery when you wake up and every 2 hours until you get to the Short Stay unit. . If your blood sugar is less than 70 mg/dL, you will need to treat for low blood sugar: o Do not take insulin. o Treat a low blood sugar (less than 70 mg/dL) with  cup of clear juice (cranberry or apple), 4 glucose tablets, OR glucose gel. o Recheck blood sugar in 15 minutes after treatment (to make sure it is greater than 70 mg/dL). If your blood sugar is not greater than 70 mg/dL on recheck, call [phone] for further instructions. . Report your blood sugar to the short stay nurse when you get to Short Stay.  [name]  If you are admitted to the hospital after surgery: o Your blood sugar will be checked by the staff and you will probably be given insulin after surgery (instead of oral diabetes medicines) to make sure you have good blood sugar levels. o The goal for blood sugar control after surgery is 80-180 mg/dL.   WHAT DO I DO ABOUT MY DIABETES MEDICATION?  [name] Do not take oral diabetes medicines (pills) the morning of surgery.         The day before surgery:        Take your usual dose of morning dose of Lantus         Take your usual dose of Metformin        Take your usual dose of Stagliptin         Take your usual dose of Victoza         Take your usual morning dose of Novolog, However, no bedtime dose of Novolog    . THE NIGHT BEFORE SURGERY, take     units of       insulin.       . THE MORNING OF SURGERY, take   units of         insulin.  . The day of surgery, do not take other diabetes injectables, including Byetta (exenatide), Bydureon (exenatide ER), Victoza  (liraglutide), or Trulicity (dulaglutide).  . If your CBG is greater than 220 mg/dL, you may take  of your sliding scale  . (correction) dose of insulin.     Patient Signature:  Date:   Nurse Signature:  Date:   Reviewed and Endorsed by [facility] Patient Education Committee, August 2015            Incentive Spirometer  An incentive spirometer is a tool that can help keep your lungs clear and active. This tool measures how well you are filling your lungs with each breath. Taking long deep breaths may help reverse or decrease the chance of developing breathing (pulmonary) problems (especially infection) following:  A long period of time when you are unable to move or be active. BEFORE THE PROCEDURE   If the spirometer includes an indicator to show your best effort, your nurse or respiratory therapist will set it to a desired goal.  If possible, sit up straight or lean slightly forward. Try not to slouch.  Hold the incentive spirometer in an upright position. INSTRUCTIONS FOR USE  1. Sit on the edge of your bed if possible, or sit up as far as you can in bed or on a chair. 2. Hold the incentive spirometer in an upright position. 3. Breathe out normally. 4. Place the mouthpiece in your mouth and seal your lips tightly around it. 5. Breathe in slowly and as deeply as possible, raising the piston or the ball toward the top of the column. 6. Hold your breath for 3-5 seconds or for as long as possible. Allow the piston or ball to fall to the bottom of the column. 7. Remove the mouthpiece from your mouth and breathe out normally. 8. Rest for a few seconds and repeat Steps 1 through 7 at least 10 times every 1-2 hours when you are awake. Take your time and take a few normal breaths between deep breaths. 9. The spirometer may include an indicator to show your best effort. Use the indicator as a goal to work toward during each repetition. 10. After each set of 10 deep breaths, practice  coughing to  be sure your lungs are clear. If you have an incision (the cut made at the time of surgery), support your incision when coughing by placing a pillow or rolled up towels firmly against it. Once you are able to get out of bed, walk around indoors and cough well. You may stop using the incentive spirometer when instructed by your caregiver.  RISKS AND COMPLICATIONS  Take your time so you do not get dizzy or light-headed.  If you are in pain, you may need to take or ask for pain medication before doing incentive spirometry. It is harder to take a deep breath if you are having pain. AFTER USE  Rest and breathe slowly and easily.  It can be helpful to keep track of a log of your progress. Your caregiver can provide you with a simple table to help with this. If you are using the spirometer at home, follow these instructions: [place] IF:   You are having difficultly using the spirometer.  You have trouble using the spirometer as often as instructed.  Your pain medication is not giving enough relief while using the spirometer.  You develop fever of 100.5 F (38.1 C) or higher. SEEK IMMEDIATE MEDICAL CARE IF:   You cough up bloody sputum that had not been present before.  You develop fever of 102 F (38.9 C) or greater.  You develop worsening pain at or near the incision site. MAKE SURE YOU:   Understand these instructions.  Will watch your condition.  Will get help right away if you are not doing well or get worse. Document Released: 04/25/2007 Document Revised: 03/06/2012 Document Reviewed: 06/26/2007 ExitCare Patient Information 2014 ExitCare, [place].   ________________________________________________________________________  WHAT IS A BLOOD TRANSFUSION? Blood Transfusion Information  A transfusion is the replacement of blood or some of its parts. Blood is made up of multiple cells which provide different functions.  Red blood cells carry oxygen and are  used for blood loss replacement.  White blood cells fight against infection.  Platelets control bleeding.  Plasma helps clot blood.  Other blood products are available for specialized needs, such as hemophilia or other clotting disorders. BEFORE THE TRANSFUSION  Who gives blood for transfusions?   Healthy volunteers who are fully evaluated to make sure their blood is safe. This is blood bank blood. Transfusion therapy is the safest it has ever been in the practice of medicine. Before blood is taken from a donor, a complete history is taken to make sure that person has no history of diseases nor engages in risky social behavior (examples are intravenous drug use or sexual activity with multiple partners). The donor's travel history is screened to minimize risk of transmitting infections, such as malaria. The donated blood is tested for signs of infectious diseases, such as HIV and hepatitis. The blood is then tested to be sure it is compatible with you in order to minimize the chance of a transfusion reaction. If you or a relative donates blood, this is often done in anticipation of surgery and is not appropriate for emergency situations. It takes many days to process the donated blood. RISKS AND COMPLICATIONS Although transfusion therapy is very safe and saves many lives, the main dangers of transfusion include:   Getting an infectious disease.  Developing a transfusion reaction. This is an allergic reaction to something in the blood you were given. Every precaution is taken to prevent this. The decision to have a blood transfusion has been considered carefully by your  caregiver before blood is given. Blood is not given unless the benefits outweigh the risks. AFTER THE TRANSFUSION  Right after receiving a blood transfusion, you will usually feel much better and more energetic. This is especially true if your red blood cells have gotten low (anemic). The transfusion raises the level of the red  blood cells which carry oxygen, and this usually causes an energy increase.  The nurse administering the transfusion will monitor you carefully for complications. HOME CARE INSTRUCTIONS  No special instructions are needed after a transfusion. You may find your energy is better. Speak with your caregiver about any limitations on activity for underlying diseases you may have. SEEK MEDICAL CARE IF:   Your condition is not improving after your transfusion.  You develop redness or irritation at the intravenous (IV) site. SEEK IMMEDIATE MEDICAL CARE IF:  Any of the following symptoms occur over the next 12 hours:  Shaking chills.  You have a temperature by mouth above 102 F (38.9 C), not controlled by medicine.  Chest, back, or muscle pain.  People around you feel you are not acting correctly or are confused.  Shortness of breath or difficulty breathing.  Dizziness and fainting.  You get a rash or develop hives.  You have a decrease in urine output.  Your urine turns a dark color or changes to pink, red, or brown. Any of the following symptoms occur over the next 10 days:  You have a temperature by mouth above 102 F (38.9 C), not controlled by medicine.  Shortness of breath.  Weakness after normal activity.  The white part of the eye turns yellow (jaundice).  You have a decrease in the amount of urine or are urinating less often.  Your urine turns a dark color or changes to pink, red, or brown. Document Released: 12/10/2000 Document Revised: 03/06/2012 Document Reviewed: 07/29/2008 [facility] Patient Information 2014 [place], [place].  _______________________________________________________________________

## 2017-05-10 NOTE — Care Management Important Message (Signed)
Important Message  Patient Details  Name: Jerry Parsons MRN: [id number] Date of Birth: [birth year]   Medicare Important Message Given:  Yes    Jolly Mango, RN 05/10/2017, 10:44 AM

## 2017-05-10 NOTE — Progress Notes (Signed)
Fortunato Curling to be D/C'd Home per MD order.  Discussed with the patient and all questions fully answered.  VSS, Skin clean, dry and intact without evidence of skin break down, no evidence of skin tears noted. IV catheter discontinued intact. Site without signs and symptoms of complications. Dressing and pressure applied.  An After Visit Summary was printed and given to the patient. Patient received prescription.  D/c education completed with patient/family including follow up instructions, medication list, d/c activities limitations if indicated, with other d/c instructions as indicated by MD - patient able to verbalize understanding, all questions fully answered.   Patient instructed to return to ED, call 911, or call MD for any changes in condition.   Patient escorted via [place], and D/C home via private auto.  Deri Fuelling 05/10/2017 12:17 PM

## 2017-05-10 NOTE — Progress Notes (Signed)
PT Cancellation Note  Patient Details Name: Jerry Parsons MRN: [id number] DOB: 06/22/1951   Cancelled Treatment:    Reason Eval/Treat Not Completed: Patient declined, no reason specified   Pt offered but declined session today.  Stated the exercises he performed yesterday increased his knee pain.  Stated he is anticipating discharge today.   He stated he is comfortable with mobility skills at this time and feels comfortable with discharge plan.  Will continue to recommend HHPT.   Chesley Noon 05/10/2017, 10:04 AM

## 2017-05-11 ENCOUNTER — Encounter (HOSPITAL_COMMUNITY)
Admission: RE | Admit: 2017-05-11 | Discharge: 2017-05-11 | Disposition: A | Discharge status: Home / Self Care | Payer: Medicare Other | Source: Ambulatory Visit | Attending: Orthopedic Surgery | Admitting: Orthopedic Surgery

## 2017-05-11 ENCOUNTER — Ambulatory Visit: Admit: 2017-05-11 | Payer: Medicare Other | Admitting: Unknown Physician Specialty

## 2017-05-11 SURGERY — COLONOSCOPY WITH PROPOFOL
Anesthesia: General

## 2017-05-12 LAB — CULTURE, BLOOD (ROUTINE X 2)
Culture: NO GROWTH
Culture: NO GROWTH
Special Requests: ADEQUATE

## 2017-05-13 LAB — CULTURE, BLOOD (ROUTINE X 2)
Culture: NO GROWTH
Culture: NO GROWTH
Special Requests: ADEQUATE

## 2017-05-17 ENCOUNTER — Encounter: Payer: Self-pay | Admitting: Family Medicine

## 2017-05-17 ENCOUNTER — Ambulatory Visit (INDEPENDENT_AMBULATORY_CARE_PROVIDER_SITE_OTHER): Payer: Medicare Other | Admitting: Family Medicine

## 2017-05-17 VITALS — BP 148/58 | HR 76 | Temp 98.0°F | Resp 16 | Wt 273.0 lb

## 2017-05-17 DIAGNOSIS — A029 Salmonella infection, unspecified: Secondary | ICD-10-CM | POA: Diagnosis not present

## 2017-05-17 DIAGNOSIS — A021 Salmonella sepsis: Secondary | ICD-10-CM

## 2017-05-17 NOTE — Progress Notes (Signed)
Subjective:  HPI Pt is here for hospital follow up from non-typhoid salmonella diarrhea. Admitted 05/07/17 and Discharged 05/10/17 Treatment with IV fluids and potassium. Pt to follow up with PCP in 1 week. Pt reports that he is feeling much better. Has not had diarrhea anymore. He still feels weak and gets tired easily.   Prior to Admission medications   Medication Sig Start Date End Date Taking? Authorizing Provider  acetaminophen (TYLENOL) 500 MG tablet Take 500 mg by mouth every 6 (six) hours as needed (for headache.).    [provider]  amiodarone (PACERONE) 200 MG tablet TAKE 1 TABLET (200 MG TOTAL) BY MOUTH DAILY. 05/10/17   Vaughan Basta, MD  aspirin EC 81 MG tablet Take 81 mg by mouth at bedtime.    [provider]  budesonide (RHINOCORT AQUA) 32 MCG/ACT nasal spray Place 1 spray into the nose daily as needed (for allergies.).     [provider]  cyclobenzaprine (FLEXERIL) 10 MG tablet TAKE 1 TABLET BY MOUTH 3 TIMES A DAY 06/01/16   Jerrol Banana., MD  dutasteride (AVODART) 0.5 MG capsule Take 1 capsule (0.5 mg total) by mouth daily. 08/04/16   Jerrol Banana., MD  esomeprazole (NEXIUM) 40 MG capsule Take 1 capsule (40 mg total) by mouth 2 (two) times daily. Patient taking differently: Take 40 mg by mouth 2 (two) times daily. Morning & Night 01/20/17   Jerrol Banana., MD  fexofenadine (ALLEGRA) 180 MG tablet Take 180 mg by mouth daily.      [provider]  furosemide (LASIX) 40 MG tablet Take 1 tablet (40 mg total) by mouth daily. 05/10/17   Vaughan Basta, MD  gabapentin (NEURONTIN) 800 MG tablet TAKE 1 TABLET 3 TIMES A DAY Patient taking differently: TAKE 2 TABLET (1600 MG) 2 TIMES A DAY 11/19/16   Jerrol Banana., MD  glucose blood (ONE TOUCH ULTRA TEST) test strip Check sugar twice daily. Dx- E11.8 05/09/17   Jerrol Banana., MD  hydrochlorothiazide (HYDRODIURIL) 12.5 MG tablet TAKE 2 TABLETS (25  MG TOTAL) BY MOUTH AT BEDTIME. 03/25/17   Jerrol Banana., MD  insulin aspart (NOVOLOG FLEXPEN) 100 UNIT/ML FlexPen INJECT 14 UNITS 3 TIMES A   DAY (MORNING, NOON, & NIGHT) 05/10/17   Vaughan Basta, MD  Insulin Pen Needle (PEN NEEDLES) 31G X 5 MM MISC 1 each by Does not apply route daily. PATIENT NEEDS NOVA TWIST NEEDLES 5 MM  DX E11.9 10/18/16   Jerrol Banana., MD  LANTUS SOLOSTAR 100 UNIT/ML Solostar Pen Inject 35 Units into the skin 2 (two) times daily.  02/09/16   [provider]  magnesium oxide (MAG-OX) 400 (241.3 Mg) MG tablet TAKE 1 TABLET (400 MG TOTAL) BY MOUTH 2 (TWO) TIMES DAILY. 01/27/17   Jerrol Banana., MD  metFORMIN (GLUCOPHAGE) 1000 MG tablet TAKE 1 TABLET (1,000 MG TOTAL) BY MOUTH 2 (TWO) TIMES DAILY. 11/24/15   [provider]  metoprolol (LOPRESSOR) 100 MG tablet TAKE 1 TABLET (100 MG TOTAL) BY MOUTH 2 (TWO) TIMES DAILY. 02/21/17   Jerrol Banana., MD  mometasone (ELOCON) 0.1 % cream Apply 1 application topically daily. For 2 weeks Patient taking differently: Apply 1 application topically daily as needed (for ezcema flare up behind ears.).  03/02/16   Jerrol Banana., MD  Multiple Vitamin (MULTIVITAMIN WITH MINERALS) TABS tablet Take 1 tablet by mouth daily. Centrum Silver.    [provider]  Omega-3 Fatty Acids (FISH OIL) 1200 MG CAPS Take 1,200 mg by mouth 2 (two) times daily.    [provider]  oxyCODONE (OXYCONTIN) 40 mg 12 hr tablet Take 1 tablet (40 mg total) by mouth every 8 (eight) hours as needed. Patient taking differently: Take 40 mg by mouth 3 (three) times daily.  03/08/17   Jerrol Banana., MD  pregabalin (LYRICA) 50 MG capsule Take 1 capsule (50 mg total) by mouth 3 (three) times daily. 01/20/17   Jerrol Banana., MD  ranitidine (ZANTAC) 300 MG tablet TAKE 1 TABLET BY MOUTH EVERY DAY 02/28/17   Jerrol Banana., MD  sitaGLIPtin (JANUVIA) 100 MG tablet Take 1 tablet (100 mg  total) by mouth daily. 01/20/17   Jerrol Banana., MD  testosterone cypionate (DEPOTESTOSTERONE CYPIONATE) 200 MG/ML injection Inject 200 mg into the muscle every 21 ( twenty-one) days.     [provider]  VICTOZA 18 MG/3ML SOPN INJECT 0.3ML (=1.8MG ) INTO THE SKIN DAILY 12/21/16   Jerrol Banana., MD    Patient Active Problem List   Diagnosis Date Noted  . Sepsis ([place]) 05/08/2017  . Sepsis due to pneumonia ([place]) 05/07/2017  . Neuropathy 10/13/2016  . Injury of kidney 01/21/2016  . Amblyopia 12/30/2015  . 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 NS (nuclear sclerosis) 12/30/2015  . Pseudoaphakia 12/30/2015  . CVA (cerebral infarction) 09/08/2015  . Dehydration 09/08/2015  . Aspiration pneumonia ([place]) 07/04/2015  . Paroxysmal atrial fibrillation ([place]) 07/04/2015  . Arthritis of knee, degenerative 05/28/2015  . Chronic back pain 09/17/2014  . Leg edema 05/07/2014  . Chronic diastolic CHF (congestive heart failure) ([place]) 05/07/2014  . Campylobacter diarrhea 04/25/2013  . Atrial flutter ([place]) 01/23/2013  . Obesity 05/19/2012  . Hyperlipidemia 11/11/2011  . Diastolic dysfunction 31/54/0086  . SOB (shortness of breath) 04/12/2011  . Diabetes mellitus type 2, uncontrolled, with complications ([place]) 76/19/5093  . HYPERTENSION, BENIGN 03/15/2011  . DVT 03/15/2011  . TACHYCARDIA 03/15/2011    Past Medical History:  Diagnosis Date  . Arrhythmia    tachycardia, A-Fib  . Arthritis   . Blind right eye   . BPH (benign prostatic hyperplasia)   . Diabetes mellitus   . DVT (deep venous thrombosis) ([place]) 02/2010  . Dysrhythmia   . 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 GERD (gastroesophageal reflux disease)   . Hypercholesteremia   . Hypertension   . Kidney failure   . Neuropathy   . Pneumonia    time 9 ;last episode 12/2015  . Pulmonary embolus ([place]) 2011  . Seasonal allergies   . Seizures ([place])    as child   . Sleep apnea    BIPAP  . Stiff neck    limited  turning s/p titanium plate placement  . TIA (transient ischemic attack)   . Wears dentures    full upper and lower    Social History   Social History  . Marital status: Married    Spouse name: N/A  . Number of children: N/A  . Years of education: N/A   Occupational History  . Not on file.   Social History Main Topics  . Smoking status: Former Smoker    Packs/day: 2.00    Years: 20.00    Types: Cigarettes    Quit date: 12/26/1989  . Smokeless tobacco: Never Used  . Alcohol use No  . Drug use: No  . Sexual activity: Not  on file   Other Topics Concern  . Not on file   Social History Narrative  . No narrative on file    Allergies  Allergen Reactions  . Carisoprodol Itching    Review of Systems  Constitutional: Positive for malaise/fatigue.  HENT: Negative.   Eyes: Negative.   Respiratory: Negative.   Cardiovascular: Negative.   Gastrointestinal: Negative.   Genitourinary: Negative.   Musculoskeletal: Negative.   Skin: Negative.   Neurological: Positive for weakness.  Endo/Heme/Allergies: Negative.   Psychiatric/Behavioral: Negative.     Immunization History  Administered Date(s) Administered  . Influenza, High Dose Seasonal PF 09/18/2015, 10/13/2016  . Influenza-Unspecified 08/27/2014  . Pneumococcal Conjugate-13 07/10/2014  . Pneumococcal Polysaccharide-23 12/02/1999, 12/30/2011  . Td 03/31/2005  . Zoster 06/30/2014    Objective:  BP (!) 148/58 (BP Location: Left Arm, Patient Position: Sitting, Cuff Size: Large)   Pulse 76   Temp 98 F (36.7 C) (Oral)   Resp 16   Wt 273 lb (123.8 kg)   SpO2 96%   BMI 38.08 kg/m   Physical Exam  Constitutional: He is oriented to person, place, and time and well-developed, well-nourished, and in no distress.  Eyes: Conjunctivae and EOM are normal. Pupils are equal, round, and reactive to light.  Neck: Normal range of motion. Neck supple.  Cardiovascular: Normal rate, regular rhythm, normal heart sounds and  intact distal pulses.   Pulmonary/Chest: Breath sounds normal.  Abdominal: Soft. Bowel sounds are normal.  Musculoskeletal: Normal range of motion. He exhibits edema (1+).  Neurological: He is alert and oriented to person, place, and time. He has normal reflexes. Gait normal. GCS score is 15.  Skin: Skin is warm and dry.  Psychiatric: Mood, memory, affect and judgment normal.    Lab Results  Component Value Date   WBC 5.5 05/09/2017   HGB 11.5 (L) 05/09/2017   HCT 34.0 (L) 05/09/2017   PLT 104 (L) 05/09/2017   GLUCOSE 163 (H) 05/10/2017   CHOL 89 (L) 12/11/2015   TRIG 298 (H) 12/11/2015   HDL 26 (L) 12/11/2015   LDLCALC 3 12/11/2015   TSH 2.21 04/03/2013   INR 1.29 05/08/2017   HGBA1C 6.6 03/08/2017    CMP     Component Value Date/Time   NA 138 05/10/2017 0948   NA 142 12/11/2015 1014   NA 137 04/12/2013 1150   K 3.7 05/10/2017 0948   K 4.0 04/12/2013 1150   CL 104 05/10/2017 0948   CL 104 04/12/2013 1150   CO2 28 05/10/2017 0948   CO2 30 04/12/2013 1150   GLUCOSE 163 (H) 05/10/2017 0948   GLUCOSE 146 (H) 04/12/2013 1150   BUN 13 05/10/2017 0948   BUN 24 12/11/2015 1014   BUN 11 04/12/2013 1150   CREATININE 1.04 05/10/2017 0948   CREATININE 0.98 04/12/2013 1150   CALCIUM 8.5 (L) 05/10/2017 0948   CALCIUM 8.8 04/12/2013 1150   PROT 6.9 05/07/2017 2034   PROT 6.5 12/11/2015 1014   PROT 6.9 04/12/2013 1150   ALBUMIN 3.8 05/07/2017 2034   ALBUMIN 4.3 12/11/2015 1014   ALBUMIN 3.3 (L) 04/12/2013 1150   AST 34 05/07/2017 2034   AST 22 04/12/2013 1150   ALT 27 05/07/2017 2034   ALT 29 04/12/2013 1150   ALKPHOS 68 05/07/2017 2034   ALKPHOS 99 04/12/2013 1150   BILITOT 0.8 05/07/2017 2034   BILITOT 0.5 12/11/2015 1014   BILITOT 0.5 04/12/2013 1150   GFRNONAA >60 05/10/2017 0948   GFRNONAA >60 04/12/2013 1150  GFRAA >60 05/10/2017 0948   GFRAA >60 04/12/2013 1150    Assessment and Plan :  1. Salmonella Improving.   2. Salmonella sepsis ([place]) 3.Knee  OA Pt cleared for TKR. 4.TIIDM Hope for A1c below 7 despite recent illness. HPI, Exam, and A&P Transcribed under the direction and in the presence of Ansar Skoda L. Cranford Mon, MD  Electronically Signed: Katina Dung, CMA I have done the exam and reviewed the above chart and it is accurate to the best of my knowledge. Development worker, community has been used in this note in any air is in the dictation or transcription are unintentional.  [facility] 05/17/2017 3:17 PM

## 2017-05-27 ENCOUNTER — Ambulatory Visit: Payer: Self-pay | Admitting: Orthopedic Surgery

## 2017-06-06 NOTE — Progress Notes (Signed)
Cardiology Office Note  Date:  06/07/2017   ID:  Jerry Parsons, DOB [birth year], MRN [id number]  PCP:  Jerrol Banana., MD   Chief Complaint  Patient presents with  . OTHER    6 month f/u no complaints today. Meds reviewed verbally with pt.    HPI:  Jerry Parsons is a very pleasant 66 year old gentleman, multiple back surgeries,  spinal stenosis , s/p arthroscopic knee surgery, total knee replacement left obesity,  poorly controlled diabetes ,  DVT, PE in March 2011 previously on warfarin,  30 year smoking history,   hypertension,  depression,  renal disease,  obstructive sleep apnea on CPAP,  chronic sweating at nighttime which he attributes to the Vicodin. Remote atrial flutter, fibrillation, last episode in 2015. Not on anticoagulation   presenting for routine followup of his hypertension and cardiac risk factors. .  Admission 5/18 for sepsis, salmonella diarrhea, per the discharge summary Hospital records reviewed with the patient in detail From eggs, per the patient He did not receive antibiotics  Since then he has been feeling well He initially had 9 pound weight loss, has recovered much of this back Denies any tachycardia or chest discomfort concerning for angina  Scheduled for knee surgery in 2 weeks No regular exercise, limited by his chronic back pain, SI joint Reports having had numerous back surgeries, 9, followed by Dr. Carloyn Manner  EKG on today's visit shows normal sinus rhythm with rate 68 bpm, no significant ST or T-wave changes  Other past medical history [place] the [place] 2017 He was in ICU for 3 days, long recovery Lost more than 30 pounds,  Lipid panel done while he was very ill early 2017, total cholesterol at that time 89  Denies any atrial fibrillation through his hospital course, reports he was changed to amiodarone IV infusion and then back to pill when he was tolerating oral medications  Previous event where he had  buckling of his knee, fall, contusion, TIA-type symptoms that took him to the hospital. He was kept overnight and most of his workup was relatively unrevealing including MRI/MRA, carotid ultrasound, echocardiogram.  History of atrial flutter, status post cardioversion on 03/14/2013.   2 admissions to the hospital for gastroenteritis/ campylobacter infection. Required a long course of antibiotics. Possible  infection from tainted chicken.  Had back surgery, severe pain, went into atrial fib/flutter,  09/2014, had cardioversion   Previously on prednisone for his "legs". Sugars ran high. Previously developed leg swelling. Wife went away and he sat in a recliner with his feet down for close to 2 weeks watching TV, drinking fluids.  Creatinine at the end of April was 1.5, BUN 27, possibly from overdiuresis  cardiac workup in the 1990s:  cardiac catheterization with "minimal blockage". Previous DVT , he did have shortness of breath.  echocardiogram March 2011 shows normal LV and RV systolic function, mild LVH, diastolic dysfunction, mildly dilated left atrium.   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  PSH:    Past Surgical History:  Procedure Laterality Date  . APPENDECTOMY    . BACK SURGERY     x 8; upper x 3 & lower  x 5  . CARDIOVERSION  03/14/13, 10/16   2014 - [place], 2016 - [place]  . CATARACT EXTRACTION W/PHACO Left 10/29/2015   Procedure: CATARACT EXTRACTION PHACO AND INTRAOCULAR LENS PLACEMENT (IOC);  Surgeon: Leandrew Koyanagi, MD;   Location: [facility];  Service: Ophthalmology;  Laterality: Left;  DIABETIC - insulin and oral meds Sleep apnea - no machine  . CHOLECYSTECTOMY    . EYE SURGERY    . GALLBLADDER SURGERY  2002  . KNEE ARTHROSCOPY     left   . ROTATOR CUFF REPAIR  2001   left   . TONSILLECTOMY      Current Outpatient Prescriptions  Medication Sig Dispense Refill  . amiodarone (PACERONE) 200 MG tablet TAKE 1 TABLET (200 MG TOTAL) BY MOUTH DAILY. 180 tablet 3  . budesonide (RHINOCORT AQUA) 32 MCG/ACT nasal spray Place 1 spray into the nose daily as needed (for allergies.).     [name] cyclobenzaprine (FLEXERIL) 10 MG tablet TAKE 1 TABLET BY MOUTH 3 TIMES A DAY 90 tablet 12  . dutasteride (AVODART) 0.5 MG capsule Take 1 capsule (0.5 mg total) by mouth daily. 90 capsule 3  . esomeprazole (NEXIUM) 40 MG capsule Take 1 capsule (40 mg total) by mouth 2 (two) times daily. (Patient taking differently: Take 40 mg by mouth 2 (two) times daily. Morning & Night) 90 capsule 3  . fexofenadine (ALLEGRA) 180 MG tablet Take 180 mg by mouth daily.      . furosemide (LASIX) 40 MG tablet Take 1 tablet (40 mg total) by mouth daily. 30 tablet 0  . gabapentin (NEURONTIN) 800 MG tablet TAKE 1 TABLET 3 TIMES A DAY (Patient taking differently: TAKE 2 TABLET (1600 MG) 2 TIMES A DAY) 270 tablet 3  . glucose blood (ONE TOUCH ULTRA TEST) test strip Check sugar twice daily. Dx- E11.8 100 each 3  . hydrochlorothiazide (HYDRODIURIL) 12.5 MG tablet TAKE 2 TABLETS (25 MG TOTAL) BY MOUTH AT BEDTIME. 90 tablet 3  . insulin aspart (NOVOLOG FLEXPEN) 100 UNIT/ML FlexPen INJECT 14 UNITS 3 TIMES A   DAY (MORNING, NOON, & NIGHT) 15 mL 3  . Insulin Pen Needle (PEN NEEDLES) 31G X 5 MM MISC 1 each by Does not apply route daily. PATIENT NEEDS NOVA TWIST NEEDLES 5 MM  DX E11.9 200 each 3  . LANTUS SOLOSTAR 100 UNIT/ML Solostar Pen Inject 35 Units into the skin 2 (two) times daily.     . magnesium oxide (MAG-OX) 400 (241.3 Mg) MG tablet TAKE 1 TABLET  (400 MG TOTAL) BY MOUTH 2 (TWO) TIMES DAILY. 180 tablet 3  . metFORMIN (GLUCOPHAGE) 1000 MG tablet TAKE 1 TABLET (1,000 MG TOTAL) BY MOUTH 2 (TWO) TIMES DAILY.    . metoprolol (LOPRESSOR) 100 MG tablet TAKE 1 TABLET (100 MG TOTAL) BY MOUTH 2 (TWO) TIMES DAILY. 60 tablet 11  . mometasone (ELOCON) 0.1 % cream Apply 1 application topically daily. For 2 weeks (Patient taking differently: Apply 1 application topically daily as needed (for ezcema flare up behind ears.). ) 45 g 0  . oxyCODONE (OXYCONTIN) 40 mg 12 hr tablet Take 1 tablet (40 mg total) by mouth every 8 (eight) hours as needed. (Patient taking differently: Take 40 mg by mouth 3 (three) times daily. ) 270 tablet 0  . pregabalin (LYRICA) 50 MG capsule Take 1 capsule (50 mg total) by mouth 3 (three) times daily. 270 capsule 1  . ranitidine (ZANTAC) 300 MG tablet TAKE 1 TABLET BY MOUTH EVERY DAY 90 tablet 3  .  sitaGLIPtin (JANUVIA) 100 MG tablet Take 1 tablet (100 mg total) by mouth daily. 90 tablet 3  . testosterone cypionate (DEPOTESTOSTERONE CYPIONATE) 200 MG/ML injection Inject 200 mg into the muscle every 21 ( twenty-one) days.     [name] VICTOZA 18 MG/3ML SOPN INJECT 0.3ML (=1.8MG ) INTO THE SKIN DAILY 27 mL 3  . acetaminophen (TYLENOL) 500 MG tablet Take 500 mg by mouth every 6 (six) hours as needed (for headache.).    [name] aspirin EC 81 MG tablet Take 81 mg by mouth at bedtime.    . Multiple Vitamin (MULTIVITAMIN WITH MINERALS) TABS tablet Take 1 tablet by mouth daily. Centrum Silver.    . Omega-3 Fatty Acids (FISH OIL) 1200 MG CAPS Take 1,200 mg by mouth 2 (two) times daily.     No current facility-administered medications for this visit.      Allergies:   Carisoprodol   Social History:  The patient  reports that he quit smoking about 27 years ago. His smoking use included Cigarettes. He has a 40.00 pack-year smoking history. He has never used smokeless tobacco. He reports that he does not drink alcohol or use drugs.   Family History:    family history includes Heart attack in his brother.    Review of Systems: Review of Systems  Constitutional: Negative.   Respiratory: Negative.   Cardiovascular: Negative.   Gastrointestinal: Negative.   Musculoskeletal: Positive for back pain and joint pain.  Neurological: Negative.   Psychiatric/Behavioral: Negative.   All other systems reviewed and are negative.    PHYSICAL EXAM: VS:  BP 126/60 (BP Location: Left Arm, Patient Position: Sitting, Cuff Size: Large)   Pulse 68   Ht 5\' 11"  (1.803 m)   Wt 262 lb 8 oz (119.1 kg)   BMI 36.61 kg/m  , BMI Body mass index is 36.61 kg/m. GEN: Well nourished, well developed, in no acute distress, obese  HEENT: normal  Neck: no JVD, carotid bruits, or masses Cardiac: RRR; no murmurs, rubs, or gallops,no edema  Respiratory:  clear to auscultation bilaterally, normal work of breathing GI: soft, nontender, nondistended, + BS MS: no deformity or atrophy  Skin: warm and dry, no rash Neuro:  Strength and sensation are intact Psych: euthymic mood, full affect    Recent Labs: 05/07/2017: ALT 27 05/08/2017: Magnesium 1.8 05/09/2017: Hemoglobin 11.5; Platelets 104 05/10/2017: BUN 13; Creatinine, Ser 1.04; Potassium 3.7; Sodium 138    Lipid Panel Lab Results  Component Value Date   CHOL 89 (L) 12/11/2015   HDL 26 (L) 12/11/2015   LDLCALC 3 12/11/2015   TRIG 298 (H) 12/11/2015      Wt Readings from Last 3 Encounters:  06/07/17 262 lb 8 oz (119.1 kg)  05/17/17 273 lb (123.8 kg)  05/08/17 267 lb 12.8 oz (121.5 kg)       ASSESSMENT AND PLAN:  Atrial flutter, unspecified type (HCC) - Plan: EKG 12-Lead No recent episodes of arrhythmia Reports he is typically very symptomatic, does not want anticoagulation, prefers to stay on low-dose amiodarone.   HYPERTENSION, BENIGN - Plan: EKG 12-Lead Blood pressure is well controlled on today's visit. No changes made to the medications.  Paroxysmal atrial fibrillation (HCC) - Plan: EKG  12-Lead No recent arrhythmia  Hyperlipidemia Cholesterol is at goal on the current lipid regimen. No changes to the medications were made. Caution him about further weight gain  Chronic diastolic CHF (congestive heart failure) ([place]) He is taking Lasix 40 mg twice a day,  Normal renal function, potassium  recovered 3.7  Uncontrolled type 2 diabetes mellitus with complication, with long-term current use of insulin encouraged weight loss, low carbohydrate diet  Primary osteoarthritis of both knees Acceptable risk for total knee replacement surgery in 2 weeks No further testing needed    Total encounter time more than 25 minutes  Greater than 50% was spent in counseling and coordination of care with the patient   Disposition:   F/U  12 months   Orders Placed This Encounter  Procedures  . EKG 12-Lead     Signed, Esmond Plants, M.D., Ph.D. 06/07/2017  [place], [place]

## 2017-06-07 ENCOUNTER — Encounter: Payer: Self-pay | Admitting: Cardiovascular Disease

## 2017-06-07 ENCOUNTER — Ambulatory Visit (INDEPENDENT_AMBULATORY_CARE_PROVIDER_SITE_OTHER): Payer: Medicare Other | Admitting: Cardiovascular Disease

## 2017-06-07 VITALS — BP 126/60 | HR 68 | Ht 71.0 in | Wt 262.5 lb

## 2017-06-07 DIAGNOSIS — E118 Type 2 diabetes mellitus with unspecified complications: Secondary | ICD-10-CM

## 2017-06-07 DIAGNOSIS — I10 Essential (primary) hypertension: Secondary | ICD-10-CM | POA: Diagnosis not present

## 2017-06-07 DIAGNOSIS — R600 Localized edema: Secondary | ICD-10-CM | POA: Diagnosis not present

## 2017-06-07 DIAGNOSIS — E782 Mixed hyperlipidemia: Secondary | ICD-10-CM | POA: Diagnosis not present

## 2017-06-07 DIAGNOSIS — I5032 Chronic diastolic (congestive) heart failure: Secondary | ICD-10-CM

## 2017-06-07 DIAGNOSIS — R0602 Shortness of breath: Secondary | ICD-10-CM

## 2017-06-07 DIAGNOSIS — I483 Typical atrial flutter: Secondary | ICD-10-CM

## 2017-06-07 DIAGNOSIS — E1165 Type 2 diabetes mellitus with hyperglycemia: Secondary | ICD-10-CM | POA: Diagnosis not present

## 2017-06-07 DIAGNOSIS — Z794 Long term (current) use of insulin: Secondary | ICD-10-CM | POA: Diagnosis not present

## 2017-06-07 DIAGNOSIS — IMO0002 Reserved for concepts with insufficient information to code with codable children: Secondary | ICD-10-CM

## 2017-06-07 NOTE — Patient Instructions (Addendum)

## 2017-06-09 ENCOUNTER — Ambulatory Visit (INDEPENDENT_AMBULATORY_CARE_PROVIDER_SITE_OTHER): Payer: Medicare Other | Admitting: Family Medicine

## 2017-06-09 ENCOUNTER — Ambulatory Visit: Payer: Medicare Other | Admitting: Family Medicine

## 2017-06-09 ENCOUNTER — Encounter: Payer: Self-pay | Admitting: Family Medicine

## 2017-06-09 VITALS — BP 122/60 | HR 76 | Temp 98.0°F | Resp 16 | Wt 265.0 lb

## 2017-06-09 DIAGNOSIS — E1165 Type 2 diabetes mellitus with hyperglycemia: Secondary | ICD-10-CM | POA: Diagnosis not present

## 2017-06-09 DIAGNOSIS — Z794 Long term (current) use of insulin: Secondary | ICD-10-CM

## 2017-06-09 DIAGNOSIS — M549 Dorsalgia, unspecified: Secondary | ICD-10-CM | POA: Diagnosis not present

## 2017-06-09 DIAGNOSIS — G8929 Other chronic pain: Secondary | ICD-10-CM | POA: Diagnosis not present

## 2017-06-09 DIAGNOSIS — IMO0002 Reserved for concepts with insufficient information to code with codable children: Secondary | ICD-10-CM

## 2017-06-09 DIAGNOSIS — E118 Type 2 diabetes mellitus with unspecified complications: Secondary | ICD-10-CM

## 2017-06-09 LAB — POCT GLYCOSYLATED HEMOGLOBIN (HGB A1C)
Est. average glucose Bld gHb Est-mCnc: 123
Hemoglobin A1C: 5.9

## 2017-06-09 MED ORDER — OXYCODONE HCL ER 40 MG PO T12A: 40.0000 mg | EXTENDED_RELEASE_TABLET | Freq: Three times a day (TID) | ORAL | 0 refills | Status: DC | PRN

## 2017-06-09 MED ORDER — OXYCODONE HCL ER 40 MG PO T12A
40.0000 mg | EXTENDED_RELEASE_TABLET | Freq: Three times a day (TID) | ORAL | 0 refills | Status: DC | PRN
Start: 2017-06-09 — End: 2017-09-14

## 2017-06-09 MED ORDER — LANTUS SOLOSTAR 100 UNIT/ML ~~LOC~~ SOPN: 30.0000 [IU] | PEN_INJECTOR | Freq: Two times a day (BID) | SUBCUTANEOUS | 3 refills | Status: DC

## 2017-06-09 NOTE — Patient Instructions (Signed)
Decrease Novolog to 12 units with each meal, and decrease Lantus to 30 units.

## 2017-06-09 NOTE — Progress Notes (Signed)
[name]    DOB: 25-Jan-1951   66 y.o.   MRN: [id number] Visit Date: 06/09/2017  Today's Provider: [name], MD   Chief Complaint  Patient presents with  . Diabetes   Subjective:    HPI Jerry Parsons needs a refill of his Novolog and Oxycodone.      Diabetes Mellitus Type II, Follow-up:   Lab Results  Component Value Date   HGBA1C 6.6 03/08/2017   HGBA1C 8.5 (H) 11/29/2016   HGBA1C 8.1 10/13/2016    Last seen for diabetes 3 months ago.  Management since then includes continuing medications. He reports excellent compliance with treatment. He is having side effects. Flatus. Current symptoms include paresthesia of the feet and visual disturbances and have been worsening. He has noticed floaters for the last 2 weeks or so. Home blood sugar records: fasting range: 140's-150's  Episodes of hypoglycemia? Not recently   Current Insulin Regimen: Lantus 35 units, Novolog 14 units, Victoza 18 mg Most Recent Eye Exam: is due in August Weight trend: stable Prior visit with dietician: yes - [place]  Current diet: in general, a "healthy" diet   Current exercise: none  Pertinent Labs:    Component Value Date/Time   CHOL 89 (L) 12/11/2015 1014   CHOL 93 04/03/2013 0209   TRIG 298 (H) 12/11/2015 1014   TRIG 163 04/03/2013 0209   HDL 26 (L) 12/11/2015 1014   HDL 25 (L) 04/03/2013 0209   LDLCALC 3 12/11/2015 1014   LDLCALC 35 04/03/2013 0209   CREATININE 1.04 05/10/2017 0948   CREATININE 0.98 04/12/2013 1150    Wt Readings from Last 3 Encounters:  06/09/17 265 lb (120.2 kg)  06/07/17 262 lb 8 oz (119.1 kg)  05/17/17 273 lb (123.8 kg)    ------------------------------------------------------------------------ Pt has had to postpone his right TKR twice. The first time was due to high blood sugars. Goal A1C prior to surgery is below 7.0%. The second time he had to postpone the surgery was because he was hospitalized for salmonella,  which caused high blood sugars.  Allergies  Allergen Reactions  . Carisoprodol Itching     Current Outpatient Prescriptions:  .  amiodarone (PACERONE) 200 MG tablet, TAKE 1 TABLET (200 MG TOTAL) BY MOUTH DAILY. (Patient taking differently: Take 400 mg by mouth daily. TAKE 1 TABLET (200 MG TOTAL) BY MOUTH DAILY.), Disp: 180 tablet, Rfl: 3 .  budesonide (RHINOCORT AQUA) 32 MCG/ACT nasal spray, Place 1 spray into the nose daily as needed (for allergies.). , Disp: , Rfl:  .  cyclobenzaprine (FLEXERIL) 10 MG tablet, TAKE 1 TABLET BY MOUTH 3 TIMES A DAY, Disp: 90 tablet, Rfl: 12 .  dutasteride (AVODART) 0.5 MG capsule, Take 1 capsule (0.5 mg total) by mouth daily., Disp: 90 capsule, Rfl: 3 .  esomeprazole (NEXIUM) 40 MG capsule, Take 1 capsule (40 mg total) by mouth 2 (two) times daily. (Patient taking differently: Take 40 mg by mouth 2 (two) times daily. Morning & Night), Disp: 90 capsule, Rfl: 3 .  fexofenadine (ALLEGRA) 180 MG tablet, Take 180 mg by mouth daily.  , Disp: , Rfl:  .  furosemide (LASIX) 40 MG tablet, Take 1 tablet (40 mg total) by mouth daily., Disp: 30 tablet, Rfl: 0 .  gabapentin (NEURONTIN) 800 MG tablet, TAKE 1 TABLET 3 TIMES A DAY (Patient taking differently: TAKE 2 TABLET (1600 MG) 2 TIMES A DAY), Disp: 270 tablet, Rfl: 3 .  glucose blood (ONE  TOUCH ULTRA TEST) test strip, Check sugar twice daily. Dx- E11.8, Disp: 100 each, Rfl: 3 .  hydrochlorothiazide (HYDRODIURIL) 12.5 MG tablet, TAKE 2 TABLETS (25 MG TOTAL) BY MOUTH AT BEDTIME., Disp: 90 tablet, Rfl: 3 .  insulin aspart (NOVOLOG FLEXPEN) 100 UNIT/ML FlexPen, INJECT 14 UNITS 3 TIMES A   DAY (MORNING, NOON, & NIGHT), Disp: 15 mL, Rfl: 3 .  Insulin Pen Needle (PEN NEEDLES) 31G X 5 MM MISC, 1 each by Does not apply route daily. PATIENT NEEDS NOVA TWIST NEEDLES 5 MM  DX E11.9, Disp: 200 each, Rfl: 3 .  LANTUS SOLOSTAR 100 UNIT/ML Solostar Pen, Inject 35 Units into the skin 2 (two) times daily. , Disp: , Rfl:  .  magnesium oxide  (MAG-OX) 400 (241.3 Mg) MG tablet, TAKE 1 TABLET (400 MG TOTAL) BY MOUTH 2 (TWO) TIMES DAILY., Disp: 180 tablet, Rfl: 3 .  metFORMIN (GLUCOPHAGE) 1000 MG tablet, TAKE 1 TABLET (1,000 MG TOTAL) BY MOUTH 2 (TWO) TIMES DAILY., Disp: , Rfl:  .  metoprolol (LOPRESSOR) 100 MG tablet, TAKE 1 TABLET (100 MG TOTAL) BY MOUTH 2 (TWO) TIMES DAILY., Disp: 60 tablet, Rfl: 11 .  mometasone (ELOCON) 0.1 % cream, Apply 1 application topically daily. For 2 weeks (Patient taking differently: Apply 1 application topically daily as needed (for ezcema flare up behind ears.). ), Disp: 45 g, Rfl: 0 .  oxyCODONE (OXYCONTIN) 40 mg 12 hr tablet, Take 1 tablet (40 mg total) by mouth every 8 (eight) hours as needed. (Patient taking differently: Take 40 mg by mouth 3 (three) times daily. ), Disp: 270 tablet, Rfl: 0 .  pregabalin (LYRICA) 50 MG capsule, Take 1 capsule (50 mg total) by mouth 3 (three) times daily., Disp: 270 capsule, Rfl: 1 .  ranitidine (ZANTAC) 300 MG tablet, TAKE 1 TABLET BY MOUTH EVERY DAY, Disp: 90 tablet, Rfl: 3 .  sitaGLIPtin (JANUVIA) 100 MG tablet, Take 1 tablet (100 mg total) by mouth daily., Disp: 90 tablet, Rfl: 3 .  testosterone cypionate (DEPOTESTOSTERONE CYPIONATE) 200 MG/ML injection, Inject 200 mg into the muscle every 21 ( twenty-one) days. , Disp: , Rfl:  .  VICTOZA 18 MG/3ML SOPN, INJECT 0.3ML (=1.8MG ) INTO THE SKIN DAILY, Disp: 27 mL, Rfl: 3 .  aspirin EC 81 MG tablet, Take 81 mg by mouth at bedtime., Disp: , Rfl:  .  Multiple Vitamin (MULTIVITAMIN WITH MINERALS) TABS tablet, Take 1 tablet by mouth daily. Centrum Silver., Disp: , Rfl:  .  Omega-3 Fatty Acids (FISH OIL) 1200 MG CAPS, Take 1,200 mg by mouth 2 (two) times daily., Disp: , Rfl:   Review of Systems  Constitutional: Negative for activity change, appetite change, chills, diaphoresis, fatigue, fever and unexpected weight change.  Eyes: Positive for visual disturbance.  Respiratory: Negative for shortness of breath and wheezing.     Cardiovascular: Negative for chest pain, palpitations and leg swelling.  Gastrointestinal: Negative.   Musculoskeletal: Positive for arthralgias and back pain.  Allergic/Immunologic: Negative.   Neurological: Positive for numbness (neuropathy).  Psychiatric/Behavioral: Negative.     Social History  Substance Use Topics  . Smoking status: Former Smoker    Packs/day: 2.00    Years: 20.00    Types: Cigarettes    Quit date: 12/26/1989  . Smokeless tobacco: Never Used  . Alcohol use No   Objective:   BP 122/60 (BP Location: Right Arm, Patient Position: Sitting, Cuff Size: Large)   Pulse 76   Temp 98 F (36.7 C) (Oral)   Resp 16  Wt 265 lb (120.2 kg)   BMI 36.96 kg/m  Vitals:   06/09/17 1512  BP: 122/60  Pulse: 76  Resp: 16  Temp: 98 F (36.7 C)  TempSrc: Oral  Weight: 265 lb (120.2 kg)     Physical Exam  Constitutional: He appears well-developed and well-nourished.  HENT:  Head: Normocephalic and atraumatic.  Eyes: Conjunctivae are normal. Pupils are equal, round, and reactive to light.  Neck: Normal range of motion. No thyromegaly present.  Cardiovascular: Normal rate, regular rhythm and normal heart sounds.   Pulmonary/Chest: Effort normal and breath sounds normal. No respiratory distress.  Abdominal: Soft. There is no tenderness.  Musculoskeletal: He exhibits no edema.  Skin: Skin is warm and dry.  Psychiatric: He has a normal mood and affect. His behavior is normal. Judgment and thought content normal.        Assessment & Plan:     1. Uncontrolled type 2 diabetes mellitus with complication, with long-term current use of insulin (HCC) A1C is 5.9% today. Proceed with knee replacement surgery. After surgery, decrease Lantus to 30 units and decrease Novolog to 12 units with each meal to prevent hypoglycemia. - POCT glycosylated hemoglobin (Hb A1C) - LANTUS SOLOSTAR 100 UNIT/ML Solostar Pen; Inject 30 Units into the skin 2 (two) times daily.  Dispense: 15 mL;  Refill: 3 Results for orders placed or performed in visit on 06/09/17  POCT glycosylated hemoglobin (Hb A1C)  Result Value Ref Range   Hemoglobin A1C 5.9    Est. average glucose Bld gHb Est-mCnc 123      2. Chronic back pain, unspecified back location, unspecified back pain laterality Stable. Refills provided. - oxyCODONE (OXYCONTIN) 40 mg 12 hr tablet; Take 1 tablet (40 mg total) by mouth every 8 (eight) hours as needed.  Dispense: 270 tablet; Refill: 0    3.Knee OA Pt cleared for surgery later this month.   Reznor Ferrando Cranford Mon, MD  [facility]

## 2017-06-14 ENCOUNTER — Other Ambulatory Visit: Payer: Self-pay | Admitting: Family Medicine

## 2017-06-14 DIAGNOSIS — M47816 Spondylosis without myelopathy or radiculopathy, lumbar region: Secondary | ICD-10-CM | POA: Diagnosis not present

## 2017-06-15 ENCOUNTER — Other Ambulatory Visit (HOSPITAL_COMMUNITY): Payer: Self-pay | Admitting: Emergency Medicine

## 2017-06-15 NOTE — Patient Instructions (Addendum)
Jerry Parsons  06/15/2017   Your procedure is scheduled on: 06-20-17  Report to [facility] Main  Entrance    Follow signs to Short Stay on first floor at 515AM  Call this number if you have problems the morning of surgery  [phone]   Remember: ONLY 1 PERSON MAY GO WITH YOU TO SHORT STAY TO GET  READY MORNING OF [place].  Do not eat food or drink liquids :After Midnight.     Take these medicines the morning of surgery with A SIP OF WATER: amiodarone(pacerone), metoprolol, gabapentin, ranitidine(zantac), dutasteride(avodart), Nexium as needed, oxycodone(oxycontin) as needed, nasal spray as needed, allegra  Bring BiPAP mask and tubing to the hospital. Device will be provided for you!                                You may not have any metal on your body including hair pins and              piercings  Do not wear jewelry, make-up, lotions, powders or perfumes, deodorant              Men may shave face and neck.   Do not bring valuables to the hospital. [place].  Contacts, dentures or bridgework may not be worn into surgery.  Leave suitcase in the car. After surgery it may be brought to your room.               Please read over the following fact sheets you were given: _____________________________________________________________________ How to Manage Your Diabetes Before and After Surgery  Why is it important to control my blood sugar before and after surgery? . Improving blood sugar levels before and after surgery helps healing and can limit problems. . A way of improving blood sugar control is eating a healthy diet by: o  Eating less sugar and carbohydrates o  Increasing activity/exercise o  Talking with your doctor about reaching your blood sugar goals . High blood sugars (greater than 180 mg/dL) can raise your risk of infections and slow your recovery, so you will need to focus on controlling  your diabetes during the weeks before surgery. . Make sure that the doctor who takes care of your diabetes knows about your planned surgery including the date and location.  How do I manage my blood sugar before surgery? . Check your blood sugar at least 4 times a day, starting 2 days before surgery, to make sure that the level is not too high or low. o Check your blood sugar the morning of your surgery when you wake up and every 2 hours until you get to the Short Stay unit. . If your blood sugar is less than 70 mg/dL, you will need to treat for low blood sugar: o Do not take insulin. o Treat a low blood sugar (less than 70 mg/dL) with  cup of clear juice (cranberry or apple), 4 glucose tablets, OR glucose gel. o Recheck blood sugar in 15 minutes after treatment (to make sure it is greater than 70 mg/dL). If your blood sugar is not greater than 70 mg/dL on recheck, call [phone] for further instructions. . Report your blood sugar to the short stay nurse  when you get to Short Stay.  . If you are admitted to the hospital after surgery: o Your blood sugar will be checked by the staff and you will probably be given insulin after surgery (instead of oral diabetes medicines) to make sure you have good blood sugar levels. o The goal for blood sugar control after surgery is 80-180 mg/dL.   WHAT DO I DO ABOUT MY DIABETES MEDICATION?  [name] Do not take oral diabetes medicines (pills) the morning of surgery.  . THE DAY BEFORE SURGERY 06-19-17,  Take usual doses of METFORMIN.  Take usual dose of SITAGLIPTIN(JANUVIA).   Take usual dose of VICTOZA.   Take usual morning and afternoon doses of NOVOLOG INSULIN. Do not take any evening or nighttime dose.  Take usual morning dose of LANTUS INSULIN. At nighttime , only take 17 units.         . THE MORNING OF SURGERY 06-20-17  DO NOT TAKE the following medications: METFORMIN, SITAGLIPTIN(JANUVIA), VICTOZA.    If your CBG is greater than 220 mg/dL, you may  only take  of your NOVOLOG INSULIN dose.    ONLY TAKE 17 UNITS OF LANTUS INSULIN.  Patient Signature:  Date:   Nurse Signature:  Date:   Reviewed and Endorsed by [facility] Patient Education Committee, August 2015  [facility] - Preparing for Surgery Before surgery, you can play an important role.  Because skin is not sterile, your skin needs to be as free of germs as possible.  You can reduce the number of germs on your skin by washing with CHG (chlorahexidine gluconate) soap before surgery.  CHG is an antiseptic cleaner which kills germs and bonds with the skin to continue killing germs even after washing. Please DO NOT use if you have an allergy to CHG or antibacterial soaps.  If your skin becomes reddened/irritated stop using the CHG and inform your nurse when you arrive at Short Stay. Do not shave (including legs and underarms) for at least 48 hours prior to the first CHG shower.  You may shave your face/neck. Please follow these instructions carefully:  1.  Shower with CHG Soap the night before surgery and the  morning of Surgery.  2.  If you choose to wash your hair, wash your hair first as usual with your  normal  shampoo.  3.  After you shampoo, rinse your hair and body thoroughly to remove the  shampoo.                           4.  Use CHG as you would any other liquid soap.  You can apply chg directly  to the skin and wash                       Gently with a scrungie or clean washcloth.  5.  Apply the CHG Soap to your body ONLY FROM THE NECK DOWN.   Do not use on face/ open                           Wound or open sores. Avoid contact with eyes, ears mouth and genitals (private parts).                       Wash face,  Genitals (private parts) with your normal soap.  6.  Wash thoroughly, paying special attention to the area where your surgery  will be performed.  7.  Thoroughly rinse your body with warm water from the neck down.  8.  DO NOT shower/wash with your normal  soap after using and rinsing off  the CHG Soap.                9.  Pat yourself dry with a clean towel.            10.  Wear clean pajamas.            11.  Place clean sheets on your bed the night of your first shower and do not  sleep with pets. Day of Surgery : Do not apply any lotions/deodorants the morning of surgery.  Please wear clean clothes to the hospital/surgery center.  FAILURE TO FOLLOW THESE INSTRUCTIONS MAY RESULT IN THE CANCELLATION OF YOUR SURGERY PATIENT SIGNATURE_________________________________  NURSE SIGNATURE__________________________________  ________________________________________________________________________            Adam Phenix  An incentive spirometer is a tool that can help keep your lungs clear and active. This tool measures how well you are filling your lungs with each breath. Taking long deep breaths may help reverse or decrease the chance of developing breathing (pulmonary) problems (especially infection) following:  A long period of time when you are unable to move or be active. BEFORE THE PROCEDURE   If the spirometer includes an indicator to show your best effort, your nurse or respiratory therapist will set it to a desired goal.  If possible, sit up straight or lean slightly forward. Try not to slouch.  Hold the incentive spirometer in an upright position. INSTRUCTIONS FOR USE  1. Sit on the edge of your bed if possible, or sit up as far as you can in bed or on a chair. 2. Hold the incentive spirometer in an upright position. 3. Breathe out normally. 4. Place the mouthpiece in your mouth and seal your lips tightly around it. 5. Breathe in slowly and as deeply as possible, raising the piston or the ball toward the top of the column. 6. Hold your breath for 3-5 seconds or for as long as possible. Allow the piston or ball to fall to the bottom of the column. 7. Remove the mouthpiece from your mouth and breathe out normally. 8. Rest  for a few seconds and repeat Steps 1 through 7 at least 10 times every 1-2 hours when you are awake. Take your time and take a few normal breaths between deep breaths. 9. The spirometer may include an indicator to show your best effort. Use the indicator as a goal to work toward during each repetition. 10. After each set of 10 deep breaths, practice coughing to be sure your lungs are clear. If you have an incision (the cut made at the time of surgery), support your incision when coughing by placing a pillow or rolled up towels firmly against it. Once you are able to get out of bed, walk around indoors and cough well. You may stop using the incentive spirometer when instructed by your caregiver.  RISKS AND COMPLICATIONS  Take your time so you do not get dizzy or light-headed.  If you are in pain, you may need to take or ask for pain medication before doing incentive spirometry. It is harder to take a deep breath if you are having pain. AFTER USE  Rest and breathe slowly and easily.  It can  be helpful to keep track of a log of your progress. Your caregiver can provide you with a simple table to help with this. If you are using the spirometer at home, follow these instructions: [place] IF:   You are having difficultly using the spirometer.  You have trouble using the spirometer as often as instructed.  Your pain medication is not giving enough relief while using the spirometer.  You develop fever of 100.5 F (38.1 C) or higher. SEEK IMMEDIATE MEDICAL CARE IF:   You cough up bloody sputum that had not been present before.  You develop fever of 102 F (38.9 C) or greater.  You develop worsening pain at or near the incision site. MAKE SURE YOU:   Understand these instructions.  Will watch your condition.  Will get help right away if you are not doing well or get worse. Document Released: 04/25/2007 Document Revised: 03/06/2012 Document Reviewed: 06/26/2007 ExitCare  Patient Information 2014 Exit[employer], [place].   ________________________________________________________________________   WHAT IS A BLOOD TRANSFUSION? Blood Transfusion Information  A transfusion is the replacement of blood or some of its parts. Blood is made up of multiple cells which provide different functions.  Red blood cells carry oxygen and are used for blood loss replacement.  White blood cells fight against infection.  Platelets control bleeding.  Plasma helps clot blood.  Other blood products are available for specialized needs, such as hemophilia or other clotting disorders. BEFORE THE TRANSFUSION  Who gives blood for transfusions?   Healthy volunteers who are fully evaluated to make sure their blood is safe. This is blood bank blood. Transfusion therapy is the safest it has ever been in the practice of medicine. Before blood is taken from a donor, a complete history is taken to make sure that person has no history of diseases nor engages in risky social behavior (examples are intravenous drug use or sexual activity with multiple partners). The donor's travel history is screened to minimize risk of transmitting infections, such as malaria. The donated blood is tested for signs of infectious diseases, such as HIV and hepatitis. The blood is then tested to be sure it is compatible with you in order to minimize the chance of a transfusion reaction. If you or a relative donates blood, this is often done in anticipation of surgery and is not appropriate for emergency situations. It takes many days to process the donated blood. RISKS AND COMPLICATIONS Although transfusion therapy is very safe and saves many lives, the main dangers of transfusion include:   Getting an infectious disease.  Developing a transfusion reaction. This is an allergic reaction to something in the blood you were given. Every precaution is taken to prevent this. The decision to have a blood transfusion has been  considered carefully by your caregiver before blood is given. Blood is not given unless the benefits outweigh the risks. AFTER THE TRANSFUSION  Right after receiving a blood transfusion, you will usually feel much better and more energetic. This is especially true if your red blood cells have gotten low (anemic). The transfusion raises the level of the red blood cells which carry oxygen, and this usually causes an energy increase.  The nurse administering the transfusion will monitor you carefully for complications. HOME CARE INSTRUCTIONS  No special instructions are needed after a transfusion. You may find your energy is better. Speak with your caregiver about any limitations on activity for underlying diseases you may have. SEEK MEDICAL CARE IF:   Your condition  is not improving after your transfusion.  You develop redness or irritation at the intravenous (IV) site. SEEK IMMEDIATE MEDICAL CARE IF:  Any of the following symptoms occur over the next 12 hours:  Shaking chills.  You have a temperature by mouth above 102 F (38.9 C), not controlled by medicine.  Chest, back, or muscle pain.  People around you feel you are not acting correctly or are confused.  Shortness of breath or difficulty breathing.  Dizziness and fainting.  You get a rash or develop hives.  You have a decrease in urine output.  Your urine turns a dark color or changes to pink, red, or brown. Any of the following symptoms occur over the next 10 days:  You have a temperature by mouth above 102 F (38.9 C), not controlled by medicine.  Shortness of breath.  Weakness after normal activity.  The white part of the eye turns yellow (jaundice).  You have a decrease in the amount of urine or are urinating less often.  Your urine turns a dark color or changes to pink, red, or brown. Document Released: 12/10/2000 Document Revised: 03/06/2012 Document Reviewed: 07/29/2008 [facility] Patient Information 2014  [place], [place].  _______________________________________________________________________

## 2017-06-15 NOTE — Progress Notes (Signed)
LOV/ cardiology clearance Rockey Situ MD 06-07-17 epic LOV/ surgical clearance [name] 06-09-17 epic  EKG 06-07-17 epic  ECHO 09-09-15 epic  CXR 05-07-17 epic

## 2017-06-16 ENCOUNTER — Encounter: Payer: Self-pay | Admitting: Family Medicine

## 2017-06-16 ENCOUNTER — Ambulatory Visit (INDEPENDENT_AMBULATORY_CARE_PROVIDER_SITE_OTHER): Payer: Medicare Other | Admitting: Family Medicine

## 2017-06-16 ENCOUNTER — Encounter (HOSPITAL_COMMUNITY)
Admission: RE | Admit: 2017-06-16 | Discharge: 2017-06-16 | Disposition: A | Discharge status: Home / Self Care | Payer: Medicare Other | Source: Ambulatory Visit | Attending: Orthopedic Surgery | Admitting: Orthopedic Surgery

## 2017-06-16 ENCOUNTER — Encounter (HOSPITAL_COMMUNITY): Payer: Self-pay

## 2017-06-16 VITALS — BP 124/70 | HR 70 | Temp 98.8°F | Resp 16 | Wt 265.6 lb

## 2017-06-16 DIAGNOSIS — H6122 Impacted cerumen, left ear: Secondary | ICD-10-CM

## 2017-06-16 DIAGNOSIS — Z01812 Encounter for preprocedural laboratory examination: Secondary | ICD-10-CM | POA: Insufficient documentation

## 2017-06-16 DIAGNOSIS — E291 Testicular hypofunction: Secondary | ICD-10-CM | POA: Diagnosis not present

## 2017-06-16 DIAGNOSIS — M1711 Unilateral primary osteoarthritis, right knee: Secondary | ICD-10-CM | POA: Diagnosis not present

## 2017-06-16 LAB — COMPREHENSIVE METABOLIC PANEL
ALT: 38 U/L (ref 17–63)
AST: 31 U/L (ref 15–41)
Albumin: 4.2 g/dL (ref 3.5–5.0)
Alkaline Phosphatase: 89 U/L (ref 38–126)
Anion gap: 8 (ref 5–15)
BUN: 26 mg/dL — ABNORMAL HIGH (ref 6–20)
CO2: 32 mmol/L (ref 22–32)
Calcium: 9.8 mg/dL (ref 8.9–10.3)
Chloride: 101 mmol/L (ref 101–111)
Creatinine, Ser: 1.4 mg/dL — ABNORMAL HIGH (ref 0.61–1.24)
GFR calc Af Amer: 59 mL/min — ABNORMAL LOW (ref >60)
GFR calc non Af Amer: 50 mL/min — ABNORMAL LOW (ref >60)
Glucose, Bld: 104 mg/dL — ABNORMAL HIGH (ref 65–99)
Potassium: 4.2 mmol/L (ref 3.5–5.1)
Sodium: 141 mmol/L (ref 135–145)
Total Bilirubin: 0.5 mg/dL (ref 0.3–1.2)
Total Protein: 7.4 g/dL (ref 6.5–8.1)

## 2017-06-16 LAB — GLUCOSE, CAPILLARY: Glucose-Capillary: 149 mg/dL — ABNORMAL HIGH (ref 65–99)

## 2017-06-16 LAB — CBC
HCT: 39 % (ref 39.0–52.0)
Hemoglobin: 12.2 g/dL — ABNORMAL LOW (ref 13.0–17.0)
MCH: 25.6 pg — ABNORMAL LOW (ref 26.0–34.0)
MCHC: 31.3 g/dL (ref 30.0–36.0)
MCV: 81.9 fL (ref 78.0–100.0)
Platelets: 142 10*3/uL — ABNORMAL LOW (ref 150–400)
RBC: 4.76 MIL/uL (ref 4.22–5.81)
RDW: 15.9 % — ABNORMAL HIGH (ref 11.5–15.5)
WBC: 9.5 10*3/uL (ref 4.0–10.5)

## 2017-06-16 LAB — PROTIME-INR
INR: 0.97
Prothrombin Time: 12.9 seconds (ref 11.4–15.2)

## 2017-06-16 LAB — SURGICAL PCR SCREEN
MRSA, PCR: NEGATIVE
Staphylococcus aureus: NEGATIVE

## 2017-06-16 LAB — APTT: aPTT: 32 seconds (ref 24–36)

## 2017-06-16 NOTE — Progress Notes (Signed)
Subjective:     Patient ID: Jerry Parsons, male   DOB: 1951/11/10, 66 y.o.   MRN: [id number]  HPI  Chief Complaint  Patient presents with  . Cerumen Impaction    Patient comes in office today with complaints of wax build up in his left ear. Patient states that last night he was cleaning his ears with a q-tip and states that he went to far in with cleaning and believes he pushed wax further into ear. Upon examination of patient it is clear to see cerumen impaction in left ear, right ear is clear and ear drum is visibly  States he now hears an "echo" in his left ear.   Review of Systems     Objective:   Physical Exam  Constitutional: He appears well-developed and well-nourished. No distress.  HENT:  Rgiht ear canal patent with intact TM Left ear canal occluded by cerumen. After irrigation per Kat: Left ear canal is patent and TM is intact. Patient reports resolution of sx.       Assessment:    Hearing loss due to cerumen impaction, left - Plan: EAR CERUMEN REMOVAL     Plan:   Discussed use of ear wax softeners in the future and to avoid Q-tips.

## 2017-06-16 NOTE — Patient Instructions (Signed)
Discussed use of ear wax softener in the future for wax removal.

## 2017-06-17 ENCOUNTER — Telehealth: Payer: Self-pay | Admitting: Physician Assistant

## 2017-06-17 ENCOUNTER — Other Ambulatory Visit: Payer: Self-pay | Admitting: Family Medicine

## 2017-06-17 MED ORDER — INSULIN ASPART 100 UNIT/ML FLEXPEN: PEN_INJECTOR | SUBCUTANEOUS | 3 refills | Status: DC

## 2017-06-17 NOTE — Telephone Encounter (Signed)
Have sent in to [facility]

## 2017-06-17 NOTE — Telephone Encounter (Signed)
Wife advised-aa 

## 2017-06-17 NOTE — Progress Notes (Signed)
CMP result routed via epic to [place] MD

## 2017-06-17 NOTE — Telephone Encounter (Signed)
Jerry Parsons wife has misplaced his Novalog and he has to have it.   She just got a supply in through mail order and thought she put it in the refrig but she can't find it anywhere.   She realizes she is going to pay out of pocket for it.    Can you please call this into [facility] [address] today???

## 2017-06-18 ENCOUNTER — Ambulatory Visit: Payer: Self-pay | Admitting: Orthopedic Surgery

## 2017-06-18 ENCOUNTER — Other Ambulatory Visit: Payer: Self-pay | Admitting: Cardiovascular Disease

## 2017-06-18 NOTE — H&P (Signed)
Jerry Parsons DOB: 09-07-1951 Married / Language: [name] / Race: White Male Date of Admission:  06/20/2017 CC:  Right Knee Pain History of Present Illness  The patient is a 66 year old male who comes in for a preoperative History and Physical. The patient is scheduled for a right total knee arthroplasty to be performed by Dr. Dione Plover. Aluisio, MD at [facility] on 06/20/2017. The patient reports right knee symptoms including: pain, catching and giving way which began over 5 year(s) ago without any known injury. The patient describes the severity of the symptoms as moderate in severity. The patient describes their pain as sharp, dull, aching, burning, stinging and throbbing. The patient feels that the symptoms are worsening. The patient has the current diagnosis of knee osteoarthritis. Previous work-up for this problem has included knee x-rays (Pt. had right knee scope 2012.). The patient reports that symptoms radiate to the right thigh. Current treatment includes knee brace (also, sports cream). Jerry Parsons right knee is giving him trouble. He has had intermittent problems for over five years, getting progressively worse over time. Pain occurs with all activities. He generally does fairly well at rest, but does have times when he has rest pain also. He has had multiple cortisone injections with decreasing benefit. He was told five years ago that he had significant arthritis and would eventually need knee replacement. He has reached a point where he would like to proceed with surgery at this time. He was originally scheduled for surgery on 05/16/2017 but had to cancel to due unexpected illness. He has been rescheduled and ready to proceed at this time. They have been treated conservatively in the past for the above stated problem and despite conservative measures, they continue to have progressive pain and severe functional limitations and dysfunction. They have failed non-operative management  including home exercise, medications, and injections. It is felt that they would benefit from undergoing total joint replacement. Risks and benefits of the procedure have been discussed with the patient and they elect to proceed with surgery. There are no active contraindications to surgery such as ongoing infection or rapidly progressive neurological disease.   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)  Diabetes Mellitus, Type II  Chronic Pain  High blood pressure  Sleep Apnea  uses BiPAP Peripheral Neuropathy  Gastroesophageal Reflux Disease  Hypercholesterolemia  Osteoporosis  Seizure Disorder  Childhood Vertigo  Cataract  Pneumonia  History of Multiple Bouts, [id number], July 2016, Jan 2017 Atrial Fibrillation  Two Episodes (once postop A.Fib) Varicose veins  Diverticulosis  Deep vein thrombosis  2011 Pulmonary Embolism  2011 Degenerative Disc Disease  Measles  Mumps  Rubella  Impaired Vision  Blind in Right Eye Shingles  Salmonella Gastroenteritis  Past History   Allergies Soma *MUSCULOSKELETAL THERAPY AGENTS*   Family History Cerebrovascular Accident  Mother. Cancer  Brother, Mother, Sister. Congestive Heart Failure  Brother, Mother. Heart Disease  Brother. Hypertension  Brother, Mother. Depression  Brother. Kidney disease  Mother.  Social History  Tobacco / smoke exposure  07/18/2015: no Tobacco use  Former smoker. 07/18/2015: smoke(d) 2 pack(s) per day Marital status  married Children  1 Current work status  disabled Number of flights of stairs before winded  2-3 No history of drug/alcohol rehab  Not under pain contract  Exercise  Exercises never Former drinker  07/18/2015: In the past drank Living situation  live with spouse Advance Directives  Living Will, Healthcare POA  Medication History Depro Shot Active. Lyrica Active.  Lipitor (40MG  Tablet, Oral) Active. Magnesium  Oxide (400 (241.3 Mg)MG Tablet, Oral) Active. Amitriptyline HCl (25MG  Tablet, Oral) Active. Testosterone Cypionate (200MG /ML Solution, Intramuscular) Active. Diazepam (2MG  Tablet, Oral) Active. Fenofibrate (145MG  Tablet, Oral) Active. NovoTwist (32G X 5 MM Misc,) Active. Pravastatin Sodium (10MG  Tablet, Oral) Active. Allegra (180MG  Tablet, Oral) Active. Amiodarone HCl (200MG  Tablet, Oral) Active. Aspirin (81MG  Tablet, Oral) Active. Centrum Silver Mens Active. Cyclobenzaprine HCl (10MG  Tablet, Oral) Active. Dutasteride (0.5MG  Capsule, Oral) Active. Esomeprazole Magnesium (40MG  Capsule DR, Oral) Active. Fish Oil Concentrate (1000MG  Capsule, 1 (one) Oral) Active. Furosemide (40MG  Tablet, Oral) Active. Gabapentin (800MG  Tablet, Oral) Active. Hydrochlorothiazide (25MG  Tablet, Oral) Active. Januvia (100MG  Tablet, Oral) Active. Lantus SoloStar (100UNIT/ML Soln Pen-inj, Subcutaneous) Active. Metoprolol Tartrate (50MG  Tablet, Oral) Active. MetFORMIN HCl (1000MG  Tablet, Oral) Active. NovoLOG FlexPen (100UNIT/ML Soln Pen-inj, Subcutaneous) Active. OxyCODONE HCl (10MG  Tablet, Oral) Active. OxyCODONE HCl ER (40MG  Tab 12HR Deter, Oral) Active. Ranitidine HCl (300MG  Capsule, Oral) Active. Victoza (18MG /3ML Soln Pen-inj, Subcutaneous) Active.  Past Surgical History  Spinal Surgery  Spinal Fusion  neck and lower back Tonsillectomy  Vasectomy  Rotator Cuff Repair  left Colon Polyp Removal - Colonoscopy  Arthroscopy of Knee  right Foot Surgery  right Neck Disc Surgery  Gallbladder Surgery  laporoscopic   Review of Systems  General Not Present- Chills, Fatigue, Fever, Memory Loss, Night Sweats, Weight Gain and Weight Loss. Skin Not Present- Eczema, Hives, Itching, Lesions and Rash. HEENT Not Present- Dentures, Double Vision, Headache, Hearing Loss, Tinnitus and Visual Loss. Respiratory Not Present- Allergies, Chronic Cough, Coughing up blood, Shortness of  breath at rest and Shortness of breath with exertion. Cardiovascular Not Present- Chest Pain, Difficulty Breathing Lying Down, Murmur, Palpitations, Racing/skipping heartbeats and Swelling. Gastrointestinal Not Present- Abdominal Pain, Bloody Stool, Constipation, Diarrhea, Difficulty Swallowing, Heartburn, Jaundice, Loss of appetitie, Nausea and Vomiting. Male Genitourinary Not Present- Blood in Urine, Discharge, Flank Pain, Incontinence, Painful Urination, Urgency, Urinary frequency, Urinary Retention, Urinating at Night and Weak urinary stream. Musculoskeletal Present- Back Pain and Joint Pain. Not Present- Joint Swelling, Morning Stiffness, Muscle Pain, Muscle Weakness and Spasms. Neurological Not Present- Blackout spells, Difficulty with balance, Dizziness, Paralysis, Tremor and Weakness. Psychiatric Not Present- Insomnia.  Vitals  Weight: 254 lb Height: 71in Body Surface Area: 2.33 m Body Mass Index: 35.43 kg/m  BP: 130/75 (Sitting, Right Arm, Standard)   Physical Exam  General Mental Status -Alert, cooperative and good historian. General Appearance-pleasant, Not in acute distress. Orientation-Oriented X3. Build & Nutrition-Well nourished and Well developed.  Head and Neck Head-normocephalic, atraumatic . Neck Global Assessment - supple, no bruit auscultated on the right, no bruit auscultated on the left.  Eye Pupil -Regular(left eye) and Round(left eye). Motion - Bilateral-EOMI.  ENMT Note: upper and lower dentures   Chest and Lung Exam Auscultation Breath sounds - clear at anterior chest wall and clear at posterior chest wall. Adventitious sounds - No Adventitious sounds.  Cardiovascular Auscultation Rhythm - Regular rate and rhythm. Heart Sounds - S1 WNL and S2 WNL. Murmurs & Other Heart Sounds - Auscultation of the heart reveals - No Murmurs.  Abdomen Palpation/Percussion Tenderness - Abdomen is non-tender to palpation. Rigidity (guarding)  - Abdomen is soft. Auscultation Auscultation of the abdomen reveals - Bowel sounds normal.  Male Genitourinary Note: Not done, not pertinent to present illness   Musculoskeletal Note: His hips show normal range of motion, no discomfort. His left knee which is recently operated on by Dr. Theda Sers has well-healed portal incisions. There is no effusion. His range  is 0 to 125, but instability or tenderness. Right knee, no effusion, varus deformity range 5 to 25, marked crepitus on range of motion and tenderness medial greater than lateral with no instability noted. Pulse sensation and motor intact.  RADIOGRAPHS AP both knees and lateral taken few months ago by Dr. Theda Sers show that he has bone-on-bone arthritis in the medial and patellofemoral compartments of the right knee with varus deformity and tibial subluxation.     Assessment & Plan Primary osteoarthritis of right knee (M17.11)  Note:Surgical Plans: Right Total Knee Replacement  Disposition: Home, Straight to outpatient therpay at [facility] on June 29th.  PCP: Dr. [name]  Topical TXA - History of DVT and PE  Anesthesia Issues: None Please note history of Atrial Fib.  Patient was instructed on what medications to stop prior to surgery.  Signed electronically by Joelene Millin, III PA-C

## 2017-06-20 ENCOUNTER — Encounter (HOSPITAL_COMMUNITY): Payer: Self-pay

## 2017-06-20 ENCOUNTER — Inpatient Hospital Stay (HOSPITAL_COMMUNITY)
Admission: RE | Admit: 2017-06-20 | Discharge: 2017-06-22 | DRG: 470 | Disposition: A | Discharge status: Home / Self Care | Payer: Medicare Other | Source: Ambulatory Visit | Attending: Orthopedic Surgery | Admitting: Orthopedic Surgery

## 2017-06-20 ENCOUNTER — Inpatient Hospital Stay (HOSPITAL_COMMUNITY): Payer: Medicare Other | Admitting: Anesthesiology

## 2017-06-20 ENCOUNTER — Encounter (HOSPITAL_COMMUNITY)
Admission: RE | Disposition: A | Discharge status: Home / Self Care | Payer: Self-pay | Source: Ambulatory Visit | Attending: Orthopedic Surgery

## 2017-06-20 DIAGNOSIS — M1711 Unilateral primary osteoarthritis, right knee: Secondary | ICD-10-CM | POA: Diagnosis not present

## 2017-06-20 DIAGNOSIS — M25561 Pain in right knee: Secondary | ICD-10-CM | POA: Diagnosis not present

## 2017-06-20 DIAGNOSIS — G8929 Other chronic pain: Secondary | ICD-10-CM | POA: Diagnosis present

## 2017-06-20 DIAGNOSIS — Z86718 Personal history of other venous thrombosis and embolism: Secondary | ICD-10-CM

## 2017-06-20 DIAGNOSIS — Z794 Long term (current) use of insulin: Secondary | ICD-10-CM

## 2017-06-20 DIAGNOSIS — N400 Enlarged prostate without lower urinary tract symptoms: Secondary | ICD-10-CM | POA: Diagnosis present

## 2017-06-20 DIAGNOSIS — E7800 Pure hypercholesterolemia, unspecified: Secondary | ICD-10-CM | POA: Diagnosis present

## 2017-06-20 DIAGNOSIS — Z8673 Personal history of transient ischemic attack (TIA), and cerebral infarction without residual deficits: Secondary | ICD-10-CM | POA: Diagnosis not present

## 2017-06-20 DIAGNOSIS — G8918 Other acute postprocedural pain: Secondary | ICD-10-CM | POA: Diagnosis not present

## 2017-06-20 DIAGNOSIS — M1710 Unilateral primary osteoarthritis, unspecified knee: Secondary | ICD-10-CM | POA: Diagnosis present

## 2017-06-20 DIAGNOSIS — Z86711 Personal history of pulmonary embolism: Secondary | ICD-10-CM

## 2017-06-20 DIAGNOSIS — I4891 Unspecified atrial fibrillation: Secondary | ICD-10-CM | POA: Diagnosis not present

## 2017-06-20 DIAGNOSIS — E1142 Type 2 diabetes mellitus with diabetic polyneuropathy: Secondary | ICD-10-CM | POA: Diagnosis present

## 2017-06-20 DIAGNOSIS — G4730 Sleep apnea, unspecified: Secondary | ICD-10-CM | POA: Diagnosis present

## 2017-06-20 DIAGNOSIS — Z87891 Personal history of nicotine dependence: Secondary | ICD-10-CM | POA: Diagnosis not present

## 2017-06-20 DIAGNOSIS — I4892 Unspecified atrial flutter: Secondary | ICD-10-CM | POA: Diagnosis not present

## 2017-06-20 DIAGNOSIS — M179 Osteoarthritis of knee, unspecified: Secondary | ICD-10-CM | POA: Diagnosis present

## 2017-06-20 DIAGNOSIS — I10 Essential (primary) hypertension: Secondary | ICD-10-CM | POA: Diagnosis present

## 2017-06-20 DIAGNOSIS — Z981 Arthrodesis status: Secondary | ICD-10-CM

## 2017-06-20 DIAGNOSIS — Z7982 Long term (current) use of aspirin: Secondary | ICD-10-CM | POA: Diagnosis not present

## 2017-06-20 DIAGNOSIS — K219 Gastro-esophageal reflux disease without esophagitis: Secondary | ICD-10-CM | POA: Diagnosis present

## 2017-06-20 DIAGNOSIS — M810 Age-related osteoporosis without current pathological fracture: Secondary | ICD-10-CM | POA: Diagnosis present

## 2017-06-20 HISTORY — PX: TOTAL KNEE ARTHROPLASTY: SHX125

## 2017-06-20 LAB — GLUCOSE, CAPILLARY
Glucose-Capillary: 146 mg/dL — ABNORMAL HIGH (ref 65–99)
Glucose-Capillary: 144 mg/dL — ABNORMAL HIGH (ref 65–99)
Glucose-Capillary: 176 mg/dL — ABNORMAL HIGH (ref 65–99)
Glucose-Capillary: 262 mg/dL — ABNORMAL HIGH (ref 65–99)
Glucose-Capillary: 308 mg/dL — ABNORMAL HIGH (ref 65–99)

## 2017-06-20 LAB — TYPE AND SCREEN
ABO/RH(D): O POS
Antibody Screen: NEGATIVE

## 2017-06-20 SURGERY — ARTHROPLASTY, KNEE, TOTAL
Anesthesia: General | Site: Knee | Laterality: Right

## 2017-06-20 MED ORDER — METHOCARBAMOL 500 MG PO TABS
500.0000 mg | ORAL_TABLET | Freq: Four times a day (QID) | ORAL | Status: DC | PRN
  Administered 2017-06-20 – 2017-06-21 (×3): 500 mg via ORAL
  Filled 2017-06-20 (×3): qty 1

## 2017-06-20 MED ORDER — BISACODYL 10 MG RE SUPP: 10.0000 mg | Freq: Every day | RECTAL | Status: DC | PRN

## 2017-06-20 MED ORDER — SODIUM CHLORIDE 0.9 % IJ SOLN
INTRAMUSCULAR | Status: AC
  Filled 2017-06-20: qty 50

## 2017-06-20 MED ORDER — PROMETHAZINE HCL 25 MG/ML IJ SOLN: 6.2500 mg | INTRAMUSCULAR | Status: DC | PRN

## 2017-06-20 MED ORDER — SODIUM CHLORIDE 0.9 % IR SOLN
Status: DC | PRN
  Administered 2017-06-20: 1000 mL

## 2017-06-20 MED ORDER — INSULIN ASPART 100 UNIT/ML ~~LOC~~ SOLN
0.0000 [IU] | Freq: Three times a day (TID) | SUBCUTANEOUS | Status: DC
  Administered 2017-06-20: 3 [IU] via SUBCUTANEOUS
  Administered 2017-06-20: 8 [IU] via SUBCUTANEOUS
  Administered 2017-06-21: 3 [IU] via SUBCUTANEOUS
  Administered 2017-06-21 (×2): 5 [IU] via SUBCUTANEOUS
  Administered 2017-06-22: 2 [IU] via SUBCUTANEOUS
  Administered 2017-06-22: 5 [IU] via SUBCUTANEOUS

## 2017-06-20 MED ORDER — PREGABALIN 50 MG PO CAPS
50.0000 mg | ORAL_CAPSULE | Freq: Three times a day (TID) | ORAL | Status: DC
  Administered 2017-06-20 – 2017-06-22 (×6): 50 mg via ORAL
  Filled 2017-06-20 (×6): qty 1

## 2017-06-20 MED ORDER — ACETAMINOPHEN 500 MG PO TABS
1000.0000 mg | ORAL_TABLET | Freq: Four times a day (QID) | ORAL | Status: AC
  Administered 2017-06-20 – 2017-06-21 (×3): 1000 mg via ORAL
  Filled 2017-06-20 (×3): qty 2

## 2017-06-20 MED ORDER — CEFAZOLIN SODIUM-DEXTROSE 2-4 GM/100ML-% IV SOLN
INTRAVENOUS | Status: AC
  Filled 2017-06-20: qty 100

## 2017-06-20 MED ORDER — METOPROLOL TARTRATE 50 MG PO TABS
100.0000 mg | ORAL_TABLET | Freq: Two times a day (BID) | ORAL | Status: DC
  Administered 2017-06-20 – 2017-06-22 (×4): 100 mg via ORAL
  Filled 2017-06-20 (×5): qty 2

## 2017-06-20 MED ORDER — ROCURONIUM BROMIDE 50 MG/5ML IV SOSY
PREFILLED_SYRINGE | INTRAVENOUS | Status: AC
  Filled 2017-06-20: qty 5

## 2017-06-20 MED ORDER — TRANEXAMIC ACID 1000 MG/10ML IV SOLN
2000.0000 mg | Freq: Once | INTRAVENOUS | Status: DC
  Filled 2017-06-20: qty 20

## 2017-06-20 MED ORDER — PHENYLEPHRINE 40 MCG/ML (10ML) SYRINGE FOR IV PUSH (FOR BLOOD PRESSURE SUPPORT)
PREFILLED_SYRINGE | INTRAVENOUS | Status: AC
  Filled 2017-06-20: qty 10

## 2017-06-20 MED ORDER — DIPHENHYDRAMINE HCL 12.5 MG/5ML PO ELIX: 12.5000 mg | ORAL_SOLUTION | ORAL | Status: DC | PRN

## 2017-06-20 MED ORDER — ACETAMINOPHEN 650 MG RE SUPP
650.0000 mg | Freq: Four times a day (QID) | RECTAL | Status: DC | PRN
  Filled 2017-06-20: qty 1

## 2017-06-20 MED ORDER — ACETAMINOPHEN 10 MG/ML IV SOLN
INTRAVENOUS | Status: AC
  Filled 2017-06-20: qty 100

## 2017-06-20 MED ORDER — OXYCODONE HCL 5 MG PO TABS
5.0000 mg | ORAL_TABLET | ORAL | Status: DC | PRN
  Administered 2017-06-20: 5 mg via ORAL
  Administered 2017-06-20: 15 mg via ORAL
  Administered 2017-06-20: 10 mg via ORAL
  Administered 2017-06-20 – 2017-06-22 (×10): 20 mg via ORAL
  Filled 2017-06-20 (×3): qty 4
  Filled 2017-06-20: qty 3
  Filled 2017-06-20 (×2): qty 4
  Filled 2017-06-20: qty 2
  Filled 2017-06-20 (×5): qty 4
  Filled 2017-06-20: qty 1

## 2017-06-20 MED ORDER — DUTASTERIDE 0.5 MG PO CAPS
0.5000 mg | ORAL_CAPSULE | Freq: Every day | ORAL | Status: DC
  Administered 2017-06-20 – 2017-06-22 (×3): 0.5 mg via ORAL
  Filled 2017-06-20 (×3): qty 1

## 2017-06-20 MED ORDER — CEFAZOLIN SODIUM-DEXTROSE 2-4 GM/100ML-% IV SOLN
2.0000 g | INTRAVENOUS | Status: AC
  Administered 2017-06-20: 2 g via INTRAVENOUS

## 2017-06-20 MED ORDER — DOCUSATE SODIUM 100 MG PO CAPS
100.0000 mg | ORAL_CAPSULE | Freq: Two times a day (BID) | ORAL | Status: DC
  Administered 2017-06-20 – 2017-06-22 (×4): 100 mg via ORAL
  Filled 2017-06-20 (×4): qty 1

## 2017-06-20 MED ORDER — OXYCODONE HCL 5 MG PO TABS: 5.0000 mg | ORAL_TABLET | Freq: Once | ORAL | Status: DC | PRN

## 2017-06-20 MED ORDER — SUCCINYLCHOLINE CHLORIDE 200 MG/10ML IV SOSY
PREFILLED_SYRINGE | INTRAVENOUS | Status: AC
  Filled 2017-06-20: qty 10

## 2017-06-20 MED ORDER — RIVAROXABAN 10 MG PO TABS
10.0000 mg | ORAL_TABLET | Freq: Every day | ORAL | Status: DC
  Administered 2017-06-21 – 2017-06-22 (×2): 10 mg via ORAL
  Filled 2017-06-20 (×2): qty 1

## 2017-06-20 MED ORDER — FENTANYL CITRATE (PF) 100 MCG/2ML IJ SOLN
100.0000 ug | Freq: Once | INTRAMUSCULAR | Status: AC
  Administered 2017-06-20: 100 ug via INTRAVENOUS

## 2017-06-20 MED ORDER — PHENOL 1.4 % MT LIQD
1.0000 | OROMUCOSAL | Status: DC | PRN
  Filled 2017-06-20: qty 177

## 2017-06-20 MED ORDER — LIRAGLUTIDE 18 MG/3ML ~~LOC~~ SOPN: 1.8000 mg | PEN_INJECTOR | Freq: Every morning | SUBCUTANEOUS | Status: DC

## 2017-06-20 MED ORDER — LACTATED RINGERS IV SOLN
INTRAVENOUS | Status: DC
  Administered 2017-06-20: 1000 mL via INTRAVENOUS
  Administered 2017-06-20: 08:00:00 via INTRAVENOUS

## 2017-06-20 MED ORDER — MENTHOL 3 MG MT LOZG: 1.0000 | LOZENGE | OROMUCOSAL | Status: DC | PRN

## 2017-06-20 MED ORDER — SODIUM CHLORIDE 0.9 % IV SOLN
INTRAVENOUS | Status: DC
  Administered 2017-06-20: 11:00:00 via INTRAVENOUS

## 2017-06-20 MED ORDER — GABAPENTIN 400 MG PO CAPS
1600.0000 mg | ORAL_CAPSULE | Freq: Two times a day (BID) | ORAL | Status: DC
  Administered 2017-06-20 – 2017-06-22 (×4): 1600 mg via ORAL
  Filled 2017-06-20 (×4): qty 4

## 2017-06-20 MED ORDER — FUROSEMIDE 40 MG PO TABS
40.0000 mg | ORAL_TABLET | Freq: Every day | ORAL | Status: DC
  Administered 2017-06-21 – 2017-06-22 (×2): 40 mg via ORAL
  Filled 2017-06-20 (×2): qty 1

## 2017-06-20 MED ORDER — MIDAZOLAM HCL 2 MG/2ML IJ SOLN
INTRAMUSCULAR | Status: AC
  Filled 2017-06-20: qty 2

## 2017-06-20 MED ORDER — LINAGLIPTIN 5 MG PO TABS
5.0000 mg | ORAL_TABLET | Freq: Every day | ORAL | Status: DC
  Administered 2017-06-21 – 2017-06-22 (×2): 5 mg via ORAL
  Filled 2017-06-20 (×2): qty 1

## 2017-06-20 MED ORDER — LIDOCAINE 2% (20 MG/ML) 5 ML SYRINGE
INTRAMUSCULAR | Status: DC | PRN
  Administered 2017-06-20: 100 mg via INTRAVENOUS

## 2017-06-20 MED ORDER — ACETAMINOPHEN 10 MG/ML IV SOLN
1000.0000 mg | Freq: Once | INTRAVENOUS | Status: AC
  Administered 2017-06-20: 1000 mg via INTRAVENOUS

## 2017-06-20 MED ORDER — INSULIN GLARGINE 100 UNIT/ML ~~LOC~~ SOLN
35.0000 [IU] | Freq: Two times a day (BID) | SUBCUTANEOUS | Status: DC
  Administered 2017-06-20 – 2017-06-22 (×4): 35 [IU] via SUBCUTANEOUS
  Filled 2017-06-20 (×5): qty 0.35

## 2017-06-20 MED ORDER — OXYCODONE HCL ER 20 MG PO T12A
40.0000 mg | EXTENDED_RELEASE_TABLET | Freq: Three times a day (TID) | ORAL | Status: DC | PRN
  Administered 2017-06-20 – 2017-06-22 (×4): 40 mg via ORAL
  Filled 2017-06-20 (×4): qty 2

## 2017-06-20 MED ORDER — MAGNESIUM OXIDE 400 (241.3 MG) MG PO TABS
400.0000 mg | ORAL_TABLET | Freq: Two times a day (BID) | ORAL | Status: DC
  Administered 2017-06-20 – 2017-06-22 (×4): 400 mg via ORAL
  Filled 2017-06-20 (×4): qty 1

## 2017-06-20 MED ORDER — FLEET ENEMA 7-19 GM/118ML RE ENEM: 1.0000 | ENEMA | Freq: Once | RECTAL | Status: DC | PRN

## 2017-06-20 MED ORDER — FENTANYL CITRATE (PF) 100 MCG/2ML IJ SOLN
INTRAMUSCULAR | Status: AC
  Administered 2017-06-20: 100 ug via INTRAVENOUS
  Filled 2017-06-20: qty 2

## 2017-06-20 MED ORDER — MORPHINE SULFATE (PF) 4 MG/ML IV SOLN
1.0000 mg | INTRAVENOUS | Status: DC | PRN
  Administered 2017-06-20 – 2017-06-22 (×7): 1 mg via INTRAVENOUS
  Filled 2017-06-20 (×7): qty 1

## 2017-06-20 MED ORDER — GABAPENTIN 300 MG PO CAPS
ORAL_CAPSULE | ORAL | Status: AC
  Administered 2017-06-20: 300 mg via ORAL
  Filled 2017-06-20: qty 1

## 2017-06-20 MED ORDER — DEXAMETHASONE SODIUM PHOSPHATE 10 MG/ML IJ SOLN
10.0000 mg | Freq: Once | INTRAMUSCULAR | Status: AC
  Administered 2017-06-20: 10 mg via INTRAVENOUS

## 2017-06-20 MED ORDER — ONDANSETRON HCL 4 MG/2ML IJ SOLN
INTRAMUSCULAR | Status: DC | PRN
  Administered 2017-06-20: 4 mg via INTRAVENOUS

## 2017-06-20 MED ORDER — OXYCODONE HCL 5 MG/5ML PO SOLN
5.0000 mg | Freq: Once | ORAL | Status: DC | PRN
  Filled 2017-06-20: qty 5

## 2017-06-20 MED ORDER — MEPERIDINE HCL 50 MG/ML IJ SOLN: 6.2500 mg | INTRAMUSCULAR | Status: DC | PRN

## 2017-06-20 MED ORDER — FENTANYL CITRATE (PF) 100 MCG/2ML IJ SOLN
INTRAMUSCULAR | Status: AC
  Filled 2017-06-20: qty 2

## 2017-06-20 MED ORDER — ONDANSETRON HCL 4 MG/2ML IJ SOLN
INTRAMUSCULAR | Status: AC
Start: 2017-06-20 — End: 2017-06-20
  Filled 2017-06-20: qty 2

## 2017-06-20 MED ORDER — METOCLOPRAMIDE HCL 5 MG PO TABS: 5.0000 mg | ORAL_TABLET | Freq: Three times a day (TID) | ORAL | Status: DC | PRN

## 2017-06-20 MED ORDER — PANTOPRAZOLE SODIUM 40 MG PO TBEC
80.0000 mg | DELAYED_RELEASE_TABLET | Freq: Two times a day (BID) | ORAL | Status: DC
  Administered 2017-06-20: 80 mg via ORAL
  Filled 2017-06-20 (×2): qty 2

## 2017-06-20 MED ORDER — DIAZEPAM 5 MG PO TABS
5.0000 mg | ORAL_TABLET | Freq: Three times a day (TID) | ORAL | Status: DC | PRN
  Administered 2017-06-20 – 2017-06-21 (×2): 5 mg via ORAL
  Filled 2017-06-20 (×3): qty 1

## 2017-06-20 MED ORDER — BUPIVACAINE LIPOSOME 1.3 % IJ SUSP
INTRAMUSCULAR | Status: DC | PRN
Start: 2017-06-20 — End: 2017-06-20
  Administered 2017-06-20: 20 mL

## 2017-06-20 MED ORDER — HYDROMORPHONE HCL 1 MG/ML IJ SOLN
INTRAMUSCULAR | Status: AC
  Filled 2017-06-20: qty 1

## 2017-06-20 MED ORDER — MIDAZOLAM HCL 5 MG/ML IJ SOLN
2.0000 mg | Freq: Once | INTRAMUSCULAR | Status: AC
  Administered 2017-06-20: 2 mg via INTRAVENOUS

## 2017-06-20 MED ORDER — ACETAMINOPHEN 325 MG PO TABS: 650.0000 mg | ORAL_TABLET | Freq: Four times a day (QID) | ORAL | Status: DC | PRN

## 2017-06-20 MED ORDER — FLUTICASONE PROPIONATE 50 MCG/ACT NA SUSP
1.0000 | Freq: Every day | NASAL | Status: DC | PRN
  Filled 2017-06-20: qty 16

## 2017-06-20 MED ORDER — ONDANSETRON HCL 4 MG/2ML IJ SOLN: 4.0000 mg | Freq: Four times a day (QID) | INTRAMUSCULAR | Status: DC | PRN

## 2017-06-20 MED ORDER — LIDOCAINE 2% (20 MG/ML) 5 ML SYRINGE
INTRAMUSCULAR | Status: AC
  Filled 2017-06-20: qty 5

## 2017-06-20 MED ORDER — PROPOFOL 10 MG/ML IV BOLUS
INTRAVENOUS | Status: DC | PRN
  Administered 2017-06-20: 250 mg via INTRAVENOUS

## 2017-06-20 MED ORDER — POLYETHYLENE GLYCOL 3350 17 G PO PACK: 17.0000 g | PACK | Freq: Every day | ORAL | Status: DC | PRN

## 2017-06-20 MED ORDER — INSULIN ASPART 100 UNIT/ML ~~LOC~~ SOLN
14.0000 [IU] | Freq: Three times a day (TID) | SUBCUTANEOUS | Status: DC
  Administered 2017-06-21 – 2017-06-22 (×5): 14 [IU] via SUBCUTANEOUS

## 2017-06-20 MED ORDER — LORATADINE 10 MG PO TABS
10.0000 mg | ORAL_TABLET | Freq: Every day | ORAL | Status: DC
  Administered 2017-06-21 – 2017-06-22 (×2): 10 mg via ORAL
  Filled 2017-06-20 (×2): qty 1

## 2017-06-20 MED ORDER — METHOCARBAMOL 1000 MG/10ML IJ SOLN
500.0000 mg | Freq: Four times a day (QID) | INTRAMUSCULAR | Status: DC | PRN
  Administered 2017-06-20: 500 mg via INTRAVENOUS
  Filled 2017-06-20: qty 550

## 2017-06-20 MED ORDER — SODIUM CHLORIDE 0.9 % IJ SOLN
INTRAMUSCULAR | Status: AC
  Filled 2017-06-20: qty 10

## 2017-06-20 MED ORDER — FAMOTIDINE 20 MG PO TABS
20.0000 mg | ORAL_TABLET | Freq: Every day | ORAL | Status: DC
  Administered 2017-06-21 – 2017-06-22 (×2): 20 mg via ORAL
  Filled 2017-06-20 (×2): qty 1

## 2017-06-20 MED ORDER — DEXAMETHASONE SODIUM PHOSPHATE 10 MG/ML IJ SOLN
INTRAMUSCULAR | Status: AC
  Filled 2017-06-20: qty 1

## 2017-06-20 MED ORDER — DEXAMETHASONE SODIUM PHOSPHATE 10 MG/ML IJ SOLN
10.0000 mg | Freq: Once | INTRAMUSCULAR | Status: DC
  Filled 2017-06-20: qty 1

## 2017-06-20 MED ORDER — CEFAZOLIN SODIUM-DEXTROSE 2-4 GM/100ML-% IV SOLN
2.0000 g | Freq: Four times a day (QID) | INTRAVENOUS | Status: AC
  Administered 2017-06-20 (×2): 2 g via INTRAVENOUS
  Filled 2017-06-20 (×2): qty 100

## 2017-06-20 MED ORDER — SODIUM CHLORIDE 0.9 % IJ SOLN
INTRAMUSCULAR | Status: DC | PRN
  Administered 2017-06-20: 10 mL
  Administered 2017-06-20: 50 mL

## 2017-06-20 MED ORDER — BUPIVACAINE LIPOSOME 1.3 % IJ SUSP
20.0000 mL | Freq: Once | INTRAMUSCULAR | Status: DC
  Filled 2017-06-20: qty 20

## 2017-06-20 MED ORDER — ONDANSETRON HCL 4 MG PO TABS: 4.0000 mg | ORAL_TABLET | Freq: Four times a day (QID) | ORAL | Status: DC | PRN

## 2017-06-20 MED ORDER — HYDROMORPHONE HCL 1 MG/ML IJ SOLN
0.2500 mg | INTRAMUSCULAR | Status: DC | PRN
  Administered 2017-06-20 (×4): 0.5 mg via INTRAVENOUS

## 2017-06-20 MED ORDER — PHENYLEPHRINE 40 MCG/ML (10ML) SYRINGE FOR IV PUSH (FOR BLOOD PRESSURE SUPPORT)
PREFILLED_SYRINGE | INTRAVENOUS | Status: DC | PRN
  Administered 2017-06-20 (×2): 80 ug via INTRAVENOUS

## 2017-06-20 MED ORDER — PROPOFOL 10 MG/ML IV BOLUS
INTRAVENOUS | Status: AC
  Filled 2017-06-20: qty 40

## 2017-06-20 MED ORDER — ROPIVACAINE HCL 5 MG/ML IJ SOLN
INTRAMUSCULAR | Status: DC | PRN
  Administered 2017-06-20: 25 mL via PERINEURAL

## 2017-06-20 MED ORDER — METOCLOPRAMIDE HCL 5 MG/ML IJ SOLN: 5.0000 mg | Freq: Three times a day (TID) | INTRAMUSCULAR | Status: DC | PRN

## 2017-06-20 MED ORDER — HYDROCHLOROTHIAZIDE 25 MG PO TABS
25.0000 mg | ORAL_TABLET | Freq: Every day | ORAL | Status: DC
  Administered 2017-06-20 – 2017-06-21 (×2): 25 mg via ORAL
  Filled 2017-06-20 (×2): qty 1

## 2017-06-20 MED ORDER — TRAMADOL HCL 50 MG PO TABS
50.0000 mg | ORAL_TABLET | Freq: Four times a day (QID) | ORAL | Status: DC | PRN
  Administered 2017-06-21: 100 mg via ORAL
  Filled 2017-06-20: qty 2

## 2017-06-20 MED ORDER — AMIODARONE HCL 200 MG PO TABS
400.0000 mg | ORAL_TABLET | Freq: Every day | ORAL | Status: DC
  Administered 2017-06-21 – 2017-06-22 (×2): 400 mg via ORAL
  Filled 2017-06-20 (×2): qty 2

## 2017-06-20 MED ORDER — GABAPENTIN 300 MG PO CAPS
300.0000 mg | ORAL_CAPSULE | Freq: Once | ORAL | Status: AC
  Administered 2017-06-20: 300 mg via ORAL

## 2017-06-20 MED ORDER — CHLORHEXIDINE GLUCONATE 4 % EX LIQD: 60.0000 mL | Freq: Once | CUTANEOUS | Status: DC

## 2017-06-20 SURGICAL SUPPLY — 49 items
BAG DECANTER FOR FLEXI CONT (MISCELLANEOUS) ×2 IMPLANT
BAG SPEC THK2 15X12 ZIP CLS (MISCELLANEOUS) ×1
BAG ZIPLOCK 12X15 (MISCELLANEOUS) ×2 IMPLANT
BANDAGE ACE 6X5 VEL STRL LF (GAUZE/BANDAGES/DRESSINGS) ×2 IMPLANT
BLADE SAG 18X100X1.27 (BLADE) ×2 IMPLANT
BLADE SAW SGTL 11.0X1.19X90.0M (BLADE) ×2 IMPLANT
BOWL SMART MIX CTS (DISPOSABLE) ×2 IMPLANT
CAPT KNEE TOTAL 3 ATTUNE ×1 IMPLANT
CEMENT HV SMART SET (Cement) ×4 IMPLANT
COVER SURGICAL LIGHT HANDLE (MISCELLANEOUS) ×2 IMPLANT
CUFF TOURN SGL QUICK 34 (TOURNIQUET CUFF) ×2
CUFF TRNQT CYL 34X4X40X1 (TOURNIQUET CUFF) ×1 IMPLANT
DECANTER SPIKE VIAL GLASS SM (MISCELLANEOUS) ×2 IMPLANT
DRAPE U-SHAPE 47X51 STRL (DRAPES) ×2 IMPLANT
DRSG ADAPTIC 3X8 NADH LF (GAUZE/BANDAGES/DRESSINGS) ×2 IMPLANT
DRSG PAD ABDOMINAL 8X10 ST (GAUZE/BANDAGES/DRESSINGS) ×2 IMPLANT
DURAPREP 26ML APPLICATOR (WOUND CARE) ×2 IMPLANT
ELECT REM PT RETURN 15FT ADLT (MISCELLANEOUS) ×2 IMPLANT
EVACUATOR 1/8 PVC DRAIN (DRAIN) ×2 IMPLANT
GAUZE SPONGE 4X4 12PLY STRL (GAUZE/BANDAGES/DRESSINGS) ×2 IMPLANT
GLOVE BIO SURGEON STRL SZ7.5 (GLOVE) IMPLANT
GLOVE BIO SURGEON STRL SZ8 (GLOVE) ×2 IMPLANT
GLOVE BIOGEL PI IND STRL 6.5 (GLOVE) IMPLANT
GLOVE BIOGEL PI IND STRL 8 (GLOVE) ×1 IMPLANT
GLOVE BIOGEL PI INDICATOR 6.5 (GLOVE)
GLOVE BIOGEL PI INDICATOR 8 (GLOVE) ×1
GLOVE SURG SS PI 6.5 STRL IVOR (GLOVE) ×4 IMPLANT
GOWN STRL REUS W/TWL LRG LVL3 (GOWN DISPOSABLE) ×3 IMPLANT
GOWN STRL REUS W/TWL XL LVL3 (GOWN DISPOSABLE) ×2 IMPLANT
HANDPIECE INTERPULSE COAX TIP (DISPOSABLE) ×2
IMMOBILIZER KNEE 20 (SOFTGOODS) ×2
IMMOBILIZER KNEE 20 THIGH 36 (SOFTGOODS) ×1 IMPLANT
MANIFOLD NEPTUNE II (INSTRUMENTS) ×2 IMPLANT
NS IRRIG 1000ML POUR BTL (IV SOLUTION) ×2 IMPLANT
PACK TOTAL KNEE CUSTOM (KITS) ×2 IMPLANT
PADDING CAST COTTON 6X4 STRL (CAST SUPPLIES) ×4 IMPLANT
POSITIONER SURGICAL ARM (MISCELLANEOUS) ×2 IMPLANT
SET HNDPC FAN SPRY TIP SCT (DISPOSABLE) ×1 IMPLANT
STRIP CLOSURE SKIN 1/2X4 (GAUZE/BANDAGES/DRESSINGS) ×3 IMPLANT
SUT MNCRL AB 4-0 PS2 18 (SUTURE) ×2 IMPLANT
SUT STRATAFIX 0 PDS 27 VIOLET (SUTURE) ×2
SUT VIC AB 2-0 CT1 27 (SUTURE) ×6
SUT VIC AB 2-0 CT1 TAPERPNT 27 (SUTURE) ×3 IMPLANT
SUTURE STRATFX 0 PDS 27 VIOLET (SUTURE) ×1 IMPLANT
SYR 30ML LL (SYRINGE) ×4 IMPLANT
TRAY FOLEY W/METER SILVER 16FR (SET/KITS/TRAYS/PACK) ×2 IMPLANT
WATER STERILE IRR 1000ML POUR (IV SOLUTION) ×4 IMPLANT
WRAP KNEE MAXI GEL POST OP (GAUZE/BANDAGES/DRESSINGS) ×2 IMPLANT
YANKAUER SUCT BULB TIP 10FT TU (MISCELLANEOUS) ×2 IMPLANT

## 2017-06-20 NOTE — Discharge Instructions (Addendum)
° °Dr. Frank Aluisio °Total Joint Specialist °[facility] °[address] °[place], [place] [address] °[phone] ° °TOTAL KNEE REPLACEMENT POSTOPERATIVE DIRECTIONS ° °Knee Rehabilitation, Guidelines Following Surgery  °Results after knee surgery are often greatly improved when you follow the exercise, range of motion and muscle strengthening exercises prescribed by your doctor. Safety measures are also important to protect the knee from further injury. Any time any of these exercises cause you to have increased pain or swelling in your knee joint, decrease the amount until you are comfortable again and slowly increase them. If you have problems or questions, call your caregiver or physical therapist for advice.  ° °HOME CARE INSTRUCTIONS  °Remove items at home which could result in a fall. This includes throw rugs or furniture in walking pathways.  °· ICE to the affected knee every three hours for 30 minutes at a time and then as needed for pain and swelling.  Continue to use ice on the knee for pain and swelling from surgery. You may notice swelling that will progress down to the foot and ankle.  This is normal after surgery.  Elevate the leg when you are not up walking on it.   °· Continue to use the breathing machine which will help keep your temperature down.  It is common for your temperature to cycle up and down following surgery, especially at night when you are not up moving around and exerting yourself.  The breathing machine keeps your lungs expanded and your temperature down. °· Do not place pillow under knee, focus on keeping the knee straight while resting ° °DIET °You may resume your previous home diet once your are discharged from the hospital. ° °DRESSING / WOUND CARE / SHOWERING °You may shower 3 days after surgery, but keep the wounds dry during showering.  You may use an occlusive plastic wrap (Press'n Seal for example), NO SOAKING/SUBMERGING IN THE BATHTUB.  If the  bandage gets wet, change with a clean dry gauze.  If the incision gets wet, pat the wound dry with a clean towel. °You may start showering once you are discharged home but do not submerge the incision under water. Just pat the incision dry and apply a dry gauze dressing on daily. °Change the surgical dressing daily and reapply a dry dressing each time. ° °ACTIVITY °Walk with your walker as instructed. °Use walker as long as suggested by your caregivers. °Avoid periods of inactivity such as sitting longer than an hour when not asleep. This helps prevent blood clots.  °You may resume a sexual relationship in one month or when given the OK by your doctor.  °You may return to work once you are cleared by your doctor.  °Do not drive a car for 6 weeks or until released by you surgeon.  °Do not drive while taking narcotics. ° °WEIGHT BEARING °Weight bearing as tolerated with assist device (walker, cane, etc) as directed, use it as long as suggested by your surgeon or therapist, typically at least 4-6 weeks. ° °POSTOPERATIVE CONSTIPATION PROTOCOL °Constipation - defined medically as fewer than three stools per week and severe constipation as less than one stool per week. ° °One of the most common issues patients have following surgery is constipation.  Even if you have a regular bowel pattern at home, your normal regimen is likely to be disrupted due to multiple reasons following surgery.  Combination of anesthesia, postoperative narcotics, change in appetite and fluid intake all can affect your bowels.    In order to avoid complications following surgery, here are some recommendations in order to help you during your recovery period. ° °Colace (docusate) - Pick up an over-the-counter form of Colace or another stool softener and take twice a day as long as you are requiring postoperative pain medications.  Take with a full glass of water daily.  If you experience loose stools or diarrhea, hold the colace until you stool forms  back up.  If your symptoms do not get better within 1 week or if they get worse, check with your doctor. ° °Dulcolax (bisacodyl) - Pick up over-the-counter and take as directed by the product packaging as needed to assist with the movement of your bowels.  Take with a full glass of water.  Use this product as needed if not relieved by Colace only.  ° °MiraLax (polyethylene glycol) - Pick up over-the-counter to have on hand.  MiraLax is a solution that will increase the amount of water in your bowels to assist with bowel movements.  Take as directed and can mix with a glass of water, juice, soda, coffee, or tea.  Take if you go more than two days without a movement. °Do not use MiraLax more than once per day. Call your doctor if you are still constipated or irregular after using this medication for 7 days in a row. ° °If you continue to have problems with postoperative constipation, please contact the office for further assistance and recommendations.  If you experience "the worst abdominal pain ever" or develop nausea or vomiting, please contact the office immediatly for further recommendations for treatment. ° °ITCHING ° If you experience itching with your medications, try taking only a single pain pill, or even half a pain pill at a time.  You can also use Benadryl over the counter for itching or also to help with sleep.  ° °TED HOSE STOCKINGS °Wear the elastic stockings on both legs for three weeks following surgery during the day but you may remove then at night for sleeping. ° °MEDICATIONS °See your medication summary on the “After Visit Summary” that the nursing staff will review with you prior to discharge.  You may have some home medications which will be placed on hold until you complete the course of blood thinner medication.  It is important for you to complete the blood thinner medication as prescribed by your surgeon.  Continue your approved medications as instructed at time of  discharge. ° °PRECAUTIONS °If you experience chest pain or shortness of breath - call 911 immediately for transfer to the hospital emergency department.  °If you develop a fever greater that 101 F, purulent drainage from wound, increased redness or drainage from wound, foul odor from the wound/dressing, or calf pain - CONTACT YOUR SURGEON.   °                                                °FOLLOW-UP APPOINTMENTS °Make sure you keep all of your appointments after your operation with your surgeon and caregivers. You should call the office at the above phone number and make an appointment for approximately two weeks after the date of your surgery or on the date instructed by your surgeon outlined in the "After Visit Summary". ° ° °RANGE OF MOTION AND STRENGTHENING EXERCISES  °Rehabilitation of the knee is important following a knee injury or   an operation. After just a few days of immobilization, the muscles of the thigh which control the knee become weakened and shrink (atrophy). Knee exercises are designed to build up the tone and strength of the thigh muscles and to improve knee motion. Often times heat used for twenty to thirty minutes before working out will loosen up your tissues and help with improving the range of motion but do not use heat for the first two weeks following surgery. These exercises can be done on a training (exercise) mat, on the floor, on a table or on a bed. Use what ever works the best and is most comfortable for you Knee exercises include:  °Leg Lifts - While your knee is still immobilized in a splint or cast, you can do straight leg raises. Lift the leg to 60 degrees, hold for 3 sec, and slowly lower the leg. Repeat 10-20 times 2-3 times daily. Perform this exercise against resistance later as your knee gets better.  °Quad and Hamstring Sets - Tighten up the muscle on the front of the thigh (Quad) and hold for 5-10 sec. Repeat this 10-20 times hourly. Hamstring sets are done by pushing the  foot backward against an object and holding for 5-10 sec. Repeat as with quad sets.  °· Leg Slides: Lying on your back, slowly slide your foot toward your buttocks, bending your knee up off the floor (only go as far as is comfortable). Then slowly slide your foot back down until your leg is flat on the floor again. °· Angel Wings: Lying on your back spread your legs to the side as far apart as you can without causing discomfort.  °A rehabilitation program following serious knee injuries can speed recovery and prevent re-injury in the future due to weakened muscles. Contact your doctor or a physical therapist for more information on knee rehabilitation.  ° °IF YOU ARE TRANSFERRED TO A SKILLED REHAB FACILITY °If the patient is transferred to a skilled rehab facility following release from the hospital, a list of the current medications will be sent to the facility for the patient to continue.  When discharged from the skilled rehab facility, please have the facility set up the patient's Home Health Physical Therapy prior to being released. Also, the skilled facility will be responsible for providing the patient with their medications at time of release from the facility to include their pain medication, the muscle relaxants, and their blood thinner medication. If the patient is still at the rehab facility at time of the two week follow up appointment, the skilled rehab facility will also need to assist the patient in arranging follow up appointment in our office and any transportation needs. ° °MAKE SURE YOU:  °Understand these instructions.  °Get help right away if you are not doing well or get worse.  ° ° °Pick up stool softner and laxative for home use following surgery while on pain medications. °Do not submerge incision under water. °Please use good hand washing techniques while changing dressing each day. °May shower starting three days after surgery. °Please use a clean towel to pat the incision dry following  showers. °Continue to use ice for pain and swelling after surgery. °Do not use any lotions or creams on the incision until instructed by your surgeon. ° °Take Xarelto for two and a half more weeks following discharge from the hospital, then discontinue Xarelto. °Once the patient has completed the Xarelto, they may resume the 81 mg Aspirin. ° ° ° °Information on   my medicine - XARELTO® (Rivaroxaban) ° °This medication education was reviewed with me or my healthcare representative as part of my discharge preparation.  The pharmacist that spoke with me during my hospital stay was:   ° °Why was Xarelto® prescribed for you? °Xarelto® was prescribed for you to reduce the risk of blood clots forming after orthopedic surgery. The medical term for these abnormal blood clots is venous thromboembolism (VTE). ° °What do you need to know about xarelto® ? °Take your Xarelto® ONCE DAILY at the same time every day. °You may take it either with or without food. ° °If you have difficulty swallowing the tablet whole, you may crush it and mix in applesauce just prior to taking your dose. ° °Take Xarelto® exactly as prescribed by your doctor and DO NOT stop taking Xarelto® without talking to the doctor who prescribed the medication.  Stopping without other VTE prevention medication to take the place of Xarelto® may increase your risk of developing a clot. ° °After discharge, you should have regular check-up appointments with your healthcare provider that is prescribing your Xarelto®.   ° °What do you do if you miss a dose? °If you miss a dose, take it as soon as you remember on the same day then continue your regularly scheduled once daily regimen the next day. Do not take two doses of Xarelto® on the same day.  ° °Important Safety Information °A possible side effect of Xarelto® is bleeding. You should call your healthcare provider right away if you experience any of the following: °? Bleeding from an injury or your nose that does not  stop. °? Unusual colored urine (red or dark brown) or unusual colored stools (red or black). °? Unusual bruising for unknown reasons. °? A serious fall or if you hit your head (even if there is no bleeding). ° °Some medicines may interact with Xarelto® and might increase your risk of bleeding while on Xarelto®. To help avoid this, consult your healthcare provider or pharmacist prior to using any new prescription or non-prescription medications, including herbals, vitamins, non-steroidal anti-inflammatory drugs (NSAIDs) and supplements. ° °This website has more information on Xarelto®: www.xarelto.com. ° ° °

## 2017-06-20 NOTE — Transfer of Care (Signed)
Immediate Anesthesia Transfer of Care Note  Patient: ALFONSA VAILE  Procedure(s) Performed: Procedure(s): RIGHT TOTAL KNEE ARTHROPLASTY (Right)  Patient Location: PACU  Anesthesia Type:Spinal and MAC combined with regional for post-op pain  Level of Consciousness: sedated and responds to stimulation  Airway & Oxygen Therapy: Patient Spontanous Breathing and Patient connected to face mask oxygen  Post-op Assessment: Report given to RN and Post -op Vital signs reviewed and stable  Post vital signs: Reviewed and stable  Last Vitals:  Vitals:   06/20/17 0712 06/20/17 0715  BP:    Pulse: 70 68  Resp: (!) 9 15  Temp:      Last Pain:  Vitals:   06/20/17 0544  TempSrc:   PainSc: 8       Patients Stated Pain Goal: 4 (72/90/21 1155)  Complications: No apparent anesthesia complications

## 2017-06-20 NOTE — Anesthesia Procedure Notes (Signed)
Anesthesia Regional Block: Adductor canal block   Pre-Anesthetic Checklist: ,, timeout performed, Correct Patient, Correct Site, Correct Laterality, Correct Procedure, Correct Position, site marked, Risks and benefits discussed,  Surgical consent,  Pre-op evaluation,  At surgeon's request and post-op pain management  Laterality: Right  Prep: Maximum Sterile Barrier Precautions used, Dura Prep       Needles:  Injection technique: Single-shot  Needle Type: Stimiplex     Needle Length: 9cm  Needle Gauge: 21     Additional Needles:   Narrative:  Start time: 06/20/2017 7:10 AM End time: 06/20/2017 7:15 AM Injection made incrementally with aspirations every 5 mL.  Performed by: Personally  Anesthesiologist: Delainee Tramel

## 2017-06-20 NOTE — Anesthesia Postprocedure Evaluation (Signed)
Anesthesia Post Note  Patient: Jerry Parsons  Procedure(s) Performed: Procedure(s) (LRB): RIGHT TOTAL KNEE ARTHROPLASTY (Right)     Patient location during evaluation: PACU Anesthesia Type: General Level of consciousness: oriented and awake and alert Pain management: pain level controlled Vital Signs Assessment: post-procedure vital signs reviewed and stable Respiratory status: spontaneous breathing, respiratory function stable and patient connected to nasal cannula oxygen Cardiovascular status: blood pressure returned to baseline and stable Postop Assessment: no headache and no backache Anesthetic complications: no    Last Vitals:  Vitals:   06/20/17 1010 06/20/17 1026  BP:  131/83  Pulse: 70 69  Resp:  16  Temp:  37 C    Last Pain:  Vitals:   06/20/17 1116  TempSrc:   PainSc: 3                  Maely Clements

## 2017-06-20 NOTE — Progress Notes (Signed)
AssistedDr. Oddono with right, ultrasound guided, adductor canal block. Side rails up, monitors on throughout procedure. See vital signs in flow sheet. Tolerated Procedure well.  

## 2017-06-20 NOTE — Progress Notes (Signed)
Orthopedic Tech Progress Note Patient Details:  Jerry Parsons 1951-06-05 [id number]  CPM Right Knee CPM Right Knee: On Right Knee Flexion (Degrees): 40 Right Knee Extension (Degrees): 10   [name] 06/20/2017, 11:41 AM

## 2017-06-20 NOTE — Interval H&P Note (Signed)
History and Physical Interval Note:  06/20/2017 6:24 AM  Jerry Parsons  has presented today for surgery, with the diagnosis of Osteoarthritis Right knee  The various methods of treatment have been discussed with the patient and family. After consideration of risks, benefits and other options for treatment, the patient has consented to  Procedure(s): RIGHT TOTAL KNEE ARTHROPLASTY (Right) as a surgical intervention .  The patient's history has been reviewed, patient examined, no change in status, stable for surgery.  I have reviewed the patient's chart and labs.  Questions were answered to the patient's satisfaction.     Gearlean Alf

## 2017-06-20 NOTE — Evaluation (Signed)
Physical Therapy Evaluation Patient Details Name: Jerry Parsons MRN: [id number] DOB: 1951-10-13 Today's Date: 06/20/2017   History of Present Illness  Pt is a 66 year old male s/p R TKA with hx including but not limited to OSA, DM, DVT/PE  Clinical Impression  Pt is s/p TKA resulting in the deficits listed below (see PT Problem List).  Pt will benefit from skilled PT to increase their independence and safety with mobility to allow discharge to the venue listed below.  Pt assisted with short distance ambulation POD #0 and plans to d/c home and start with outpatient PT.     Follow Up Recommendations DC plan and follow up therapy as arranged by surgeon;Outpatient PT    Equipment Recommendations  None recommended by PT    Recommendations for Other Services       Precautions / Restrictions Precautions Precautions: Fall;Knee Required Braces or Orthoses: Knee Immobilizer - Right Restrictions Other Position/Activity Restrictions: WBAT      Mobility  Bed Mobility Overal bed mobility: Needs Assistance Bed Mobility: Supine to Sit     Supine to sit: Min assist;HOB elevated     General bed mobility comments: verbal cues for technique, assist for R LE  Transfers Overall transfer level: Needs assistance Equipment used: Rolling walker (2 wheeled) Transfers: Sit to/from Stand Sit to Stand: Min assist         General transfer comment: verbal cues for safe technique including UE and LE positioning  Ambulation/Gait Ambulation/Gait assistance: Min assist Ambulation Distance (Feet): 15 Feet Assistive device: Rolling walker (2 wheeled) Gait Pattern/deviations: Step-to pattern;Decreased stance time - right;Antalgic     General Gait Details: 15'x2 with seated rest break, verbal cues for sequence, RW positioning, step length  Stairs            Wheelchair Mobility    Modified Rankin (Stroke Patients Only)       Balance                                              Pertinent Vitals/Pain Pain Assessment: 0-10 Pain Score: 7  Pain Location: R knee Pain Descriptors / Indicators: Sore;Aching Pain Intervention(s): Limited activity within patient's tolerance;Monitored during session;Repositioned;Ice applied;Premedicated before session    [place] expects to be discharged to:: Private residence Living Arrangements: Spouse/significant other Available Help at Discharge: Family;Available 24 hours/day Type of Home: House Home Access: Stairs to enter Entrance Stairs-Rails: None Entrance Stairs-Number of Steps: 1 Home Layout: One level Home Equipment: Cane - quad;Cane - single point;Walker - 2 wheels;Crutches      Prior Function Level of Independence: Independent               Hand Dominance        Extremity/Trunk Assessment        Lower Extremity Assessment Lower Extremity Assessment: RLE deficits/detail RLE Deficits / Details: unable to perform SLR, maintained KI       Communication   Communication: No difficulties  Cognition Arousal/Alertness: Awake/alert Behavior During Therapy: WFL for tasks assessed/performed Overall Cognitive Status: Within Functional Limits for tasks assessed                                        General Comments      Exercises  Assessment/Plan    PT Assessment Patient needs continued PT services  PT Problem List Decreased strength;Decreased range of motion;Decreased knowledge of use of DME;Pain;Decreased knowledge of precautions;Decreased mobility       PT Treatment Interventions Functional mobility training;Stair training;Gait training;DME instruction;Therapeutic activities;Therapeutic exercise;Patient/family education    PT Goals (Current goals can be found in the Care Plan section)  Acute Rehab PT Goals PT Goal Formulation: With patient Time For Goal Achievement: 06/25/17 Potential to Achieve Goals: Good    Frequency 7X/week    Barriers to discharge        Co-evaluation               AM-PAC PT "6 Clicks" Daily Activity  Outcome Measure Difficulty turning over in bed (including adjusting bedclothes, sheets and blankets)?: Total Difficulty moving from lying on back to sitting on the side of the bed? : Total Difficulty sitting down on and standing up from a chair with arms (e.g., wheelchair, bedside commode, etc,.)?: Total Help needed moving to and from a bed to chair (including a wheelchair)?: A Little Help needed walking in hospital room?: A Little Help needed climbing 3-5 steps with a railing? : A Little 6 Click Score: 12    End of Session Equipment Utilized During Treatment: Right knee immobilizer;Gait belt Activity Tolerance: Patient tolerated treatment well Patient left: in chair;with call bell/phone within reach;with family/visitor present Nurse Communication: Mobility status PT Visit Diagnosis: Other abnormalities of gait and mobility (R26.89);Pain Pain - Right/Left: Right Pain - part of body: Knee    Time: 1452-1510 PT Time Calculation (min) (ACUTE ONLY): 18 min   Charges:   PT Evaluation $PT Eval Low Complexity: 1 Procedure     PT G Codes:        Carmelia Bake, PT, DPT 06/20/2017 Pager: [phone]   York Ram E 06/20/2017, 5:04 PM

## 2017-06-20 NOTE — Telephone Encounter (Signed)
Please review correct dose of Furosemide.  Patient is requesting a refill.

## 2017-06-20 NOTE — Op Note (Signed)
OPERATIVE REPORT-TOTAL KNEE ARTHROPLASTY   Pre-operative diagnosis- Osteoarthritis  Right knee(s)  Post-operative diagnosis- Osteoarthritis Right knee(s)  Procedure-  Right  Total Knee Arthroplasty  Surgeon- Dione Plover. Mukund Weinreb, MD  Assistant- Ardeen Jourdain, PA-C   Anesthesia-  GA combined with regional for post-op pain  EBL-* No blood loss amount entered *   Drains Hemovac  Tourniquet time- 39 minutes @ 401 mm Hg   Complications- None  Condition-PACU - hemodynamically stable.   Brief Clinical Note  Jerry Parsons is a 66 y.o. year old male with end stage OA of his right knee with progressively worsening pain and dysfunction. He has constant pain, with activity and at rest and significant functional deficits with difficulties even with ADLs. He has had extensive non-op management including analgesics, injections of cortisone and viscosupplements, and home exercise program, but remains in significant pain with significant dysfunction. Radiographs show bone on bone arthritis medial and patellofemoral. He presents now for right Total Knee Arthroplasty.    Procedure in detail---   The patient is brought into the operating room and positioned supine on the operating table. After successful administration of  GA combined with regional for post-op pain,   a tourniquet is placed high on the  Right thigh(s) and the lower extremity is prepped and draped in the usual sterile fashion. Time out is performed by the operating team and then the  Right lower extremity is wrapped in Esmarch, knee flexed and the tourniquet inflated to 300 mmHg.       A midline incision is made with a ten blade through the subcutaneous tissue to the level of the extensor mechanism. A fresh blade is used to make a medial parapatellar arthrotomy. Soft tissue over the proximal medial tibia is subperiosteally elevated to the joint line with a knife and into the semimembranosus bursa with a Cobb elevator. Soft tissue over  the proximal lateral tibia is elevated with attention being paid to avoiding the patellar tendon on the tibial tubercle. The patella is everted, knee flexed 90 degrees and the ACL and PCL are removed. Findings are bone on bone medial and patellofemoral with large global osteophytes.        The drill is used to create a starting hole in the distal femur and the canal is thoroughly irrigated with sterile saline to remove the fatty contents. The 5 degree Right  valgus alignment guide is placed into the femoral canal and the distal femoral cutting block is pinned to remove 10 mm off the distal femur. Resection is made with an oscillating saw.      The tibia is subluxed forward and the menisci are removed. The extramedullary alignment guide is placed referencing proximally at the medial aspect of the tibial tubercle and distally along the second metatarsal axis and tibial crest. The block is pinned to remove 58mm off the more deficient medial  side. Resection is made with an oscillating saw. Size 7is the most appropriate size for the tibia and the proximal tibia is prepared with the modular drill and keel punch for that size.      The femoral sizing guide is placed and size 7 is most appropriate. Rotation is marked off the epicondylar axis and confirmed by creating a rectangular flexion gap at 90 degrees. The size 7 cutting block is pinned in this rotation and the anterior, posterior and chamfer cuts are made with the oscillating saw. The intercondylar block is then placed and that cut is made.  Trial size 7 tibial component, trial size 7 posterior stabilized femur and a 6  mm posterior stabilized rotating platform insert trial is placed. Full extension is achieved with excellent varus/valgus and anterior/posterior balance throughout full range of motion. The patella is everted and thickness measured to be 27  mm. Free hand resection is taken to 15 mm, a 41 template is placed, lug holes are drilled, trial patella  is placed, and it tracks normally. Osteophytes are removed off the posterior femur with the trial in place. All trials are removed and the cut bone surfaces prepared with pulsatile lavage. Cement is mixed and once ready for implantation, the size 7 tibial implant, size  7 posterior stabilized femoral component, and the size 41 patella are cemented in place and the patella is held with the clamp. The trial insert is placed and the knee held in full extension. The Exparel (20 ml mixed with 60 ml saline) is injected into the extensor mechanism, posterior capsule, medial and lateral gutters and subcutaneous tissues.  All extruded cement is removed and once the cement is hard the permanent 6 mm posterior stabilized rotating platform insert is placed into the tibial tray.      The wound is copiously irrigated with saline solution and the extensor mechanism closed over a hemovac drain with #1 V-loc suture. The tourniquet is released for a total tourniquet time of 39  minutes. Flexion against gravity is 140 degrees and the patella tracks normally. Subcutaneous tissue is closed with 2.0 vicryl and subcuticular with running 4.0 Monocryl. The incision is cleaned and dried and steri-strips and a bulky sterile dressing are applied. The limb is placed into a knee immobilizer and the patient is awakened and transported to recovery in stable condition.      Please note that a surgical assistant was a medical necessity for this procedure in order to perform it in a safe and expeditious manner. Surgical assistant was necessary to retract the ligaments and vital neurovascular structures to prevent injury to them and also necessary for proper positioning of the limb to allow for anatomic placement of the prosthesis.   Dione Plover Dorthy Magnussen, MD    06/20/2017, 8:26 AM

## 2017-06-20 NOTE — Anesthesia Procedure Notes (Signed)
Procedure Name: LMA Insertion Date/Time: 06/20/2017 7:20 AM Performed by: Lind Covert Pre-anesthesia Checklist: Patient identified, Emergency Drugs available, Suction available, Patient being monitored and Timeout performed Patient Re-evaluated:Patient Re-evaluated prior to inductionOxygen Delivery Method: Circle system utilized Preoxygenation: Pre-oxygenation with 100% oxygen Intubation Type: IV induction LMA: LMA inserted LMA Size: 5.0 Number of attempts: 1 Placement Confirmation: positive ETCO2 and breath sounds checked- equal and bilateral Tube secured with: Tape Dental Injury: Teeth and Oropharynx as per pre-operative assessment

## 2017-06-20 NOTE — H&P (View-Only) (Signed)
Jerry Parsons DOB: 1951-01-11 Married / Language: Jerry Parsons / Race: White Male Date of Admission:  06/20/2017 CC:  Right Knee Pain History of Present Illness  The patient is a 66 year old male who comes in for a preoperative History and Physical. The patient is scheduled for a right total knee arthroplasty to be performed by Dr. Dione Plover. Aluisio, MD at [facility] on 06/20/2017. The patient reports right knee symptoms including: pain, catching and giving way which began over 5 year(s) ago without any known injury. The patient describes the severity of the symptoms as moderate in severity. The patient describes their pain as sharp, dull, aching, burning, stinging and throbbing. The patient feels that the symptoms are worsening. The patient has the current diagnosis of knee osteoarthritis. Previous work-up for this problem has included knee x-rays (Pt. had right knee scope 2012.). The patient reports that symptoms radiate to the right thigh. Current treatment includes knee brace (also, sports cream). Mr. Pons right knee is giving him trouble. He has had intermittent problems for over five years, getting progressively worse over time. Pain occurs with all activities. He generally does fairly well at rest, but does have times when he has rest pain also. He has had multiple cortisone injections with decreasing benefit. He was told five years ago that he had significant arthritis and would eventually need knee replacement. He has reached a point where he would like to proceed with surgery at this time. He was originally scheduled for surgery on 05/16/2017 but had to cancel to due unexpected illness. He has been rescheduled and ready to proceed at this time. They have been treated conservatively in the past for the above stated problem and despite conservative measures, they continue to have progressive pain and severe functional limitations and dysfunction. They have failed non-operative management  including home exercise, medications, and injections. It is felt that they would benefit from undergoing total joint replacement. Risks and benefits of the procedure have been discussed with the patient and they elect to proceed with surgery. There are no active contraindications to surgery such as ongoing infection or rapidly progressive neurological disease.   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 Cancer  Brother, Mother, Sister. Congestive Heart Failure  Brother, Mother. Heart Disease  Brother. Hypertension  Brother, Mother. Depression  Brother. Kidney disease  Mother.  Social History  Tobacco / smoke exposure  07/18/2015: no Tobacco use  Former smoker. 07/18/2015: smoke(d) 2 pack(s) per day Marital status  married Children  1 Current work status  disabled Number of flights of stairs before winded  2-3 No history of drug/alcohol rehab  Not under pain contract  Exercise  Exercises never Former drinker  07/18/2015: In the past drank Living situation  live with spouse Advance Directives  Living Will, Healthcare POA  Medication History Depro Shot Active. Lyrica Active.  Lipitor (40MG  Tablet, Oral) Active. Magnesium  Oxide (400 (241.3 Mg)MG Tablet, Oral) Active. Amitriptyline HCl (25MG  Tablet, Oral) Active. Testosterone Cypionate (200MG /ML Solution, Intramuscular) Active. Diazepam (2MG  Tablet, Oral) Active. Fenofibrate (145MG  Tablet, Oral) Active. NovoTwist (32G X 5 MM Misc,) Active. Pravastatin Sodium (10MG  Tablet, Oral) Active. Allegra (180MG  Tablet, Oral) Active. Amiodarone HCl (200MG  Tablet, Oral) Active. Aspirin (81MG  Tablet, Oral) Active. Centrum Silver Mens Active. Cyclobenzaprine HCl (10MG  Tablet, Oral) Active. Dutasteride (0.5MG  Capsule, Oral) Active. Esomeprazole Magnesium (40MG  Capsule DR, Oral) Active. Fish Oil Concentrate (1000MG  Capsule, 1 (one) Oral) Active. Furosemide (40MG  Tablet, Oral) Active. Gabapentin (800MG  Tablet, Oral) Active. Hydrochlorothiazide (25MG  Tablet, Oral) Active. Januvia (100MG  Tablet, Oral) Active. Lantus SoloStar (100UNIT/ML Soln Pen-inj, Subcutaneous) Active. Metoprolol Tartrate (50MG  Tablet, Oral) Active. MetFORMIN HCl (1000MG  Tablet, Oral) Active. NovoLOG FlexPen (100UNIT/ML Soln Pen-inj, Subcutaneous) Active. OxyCODONE HCl (10MG  Tablet, Oral) Active. OxyCODONE HCl ER (40MG  Tab 12HR Deter, Oral) Active. Ranitidine HCl (300MG  Capsule, Oral) Active. Victoza (18MG /3ML Soln Pen-inj, Subcutaneous) Active.  Past Surgical History  Spinal Surgery  Spinal Fusion  neck and lower back Tonsillectomy  Vasectomy  Rotator Cuff Repair  left Colon Polyp Removal - Colonoscopy  Arthroscopy of Knee  right Foot Surgery  right Neck Disc Surgery  Gallbladder Surgery  laporoscopic   Review of Systems  General Not Present- Chills, Fatigue, Fever, Memory Loss, Night Sweats, Weight Gain and Weight Loss. Skin Not Present- Eczema, Hives, Itching, Lesions and Rash. HEENT Not Present- Dentures, Double Vision, Headache, Hearing Loss, Tinnitus and Visual Loss. Respiratory Not Present- Allergies, Chronic Cough, Coughing up blood, Shortness of  breath at rest and Shortness of breath with exertion. Cardiovascular Not Present- Chest Pain, Difficulty Breathing Lying Down, Murmur, Palpitations, Racing/skipping heartbeats and Swelling. Gastrointestinal Not Present- Abdominal Pain, Bloody Stool, Constipation, Diarrhea, Difficulty Swallowing, Heartburn, Jaundice, Loss of appetitie, Nausea and Vomiting. Male Genitourinary Not Present- Blood in Urine, Discharge, Flank Pain, Incontinence, Painful Urination, Urgency, Urinary frequency, Urinary Retention, Urinating at Night and Weak urinary stream. Musculoskeletal Present- Back Pain and Joint Pain. Not Present- Joint Swelling, Morning Stiffness, Muscle Pain, Muscle Weakness and Spasms. Neurological Not Present- Blackout spells, Difficulty with balance, Dizziness, Paralysis, Tremor and Weakness. Psychiatric Not Present- Insomnia.  Vitals  Weight: 254 lb Height: 71in Body Surface Area: 2.33 m Body Mass Index: 35.43 kg/m  BP: 130/75 (Sitting, Right Arm, Standard)   Physical Exam  General Mental Status -Alert, cooperative and good historian. General Appearance-pleasant, Not in acute distress. Orientation-Oriented X3. Build & Nutrition-Well nourished and Well developed.  Head and Neck Head-normocephalic, atraumatic . Neck Global Assessment - supple, no bruit auscultated on the right, no bruit auscultated on the left.  Eye Pupil -Regular(left eye) and Round(left eye). Motion - Bilateral-EOMI.  ENMT Note: upper and lower dentures   Chest and Lung Exam Auscultation Breath sounds - clear at anterior chest wall and clear at posterior chest wall. Adventitious sounds - No Adventitious sounds.  Cardiovascular Auscultation Rhythm - Regular rate and rhythm. Heart Sounds - S1 WNL and S2 WNL. Murmurs & Other Heart Sounds - Auscultation of the heart reveals - No Murmurs.  Abdomen Palpation/Percussion Tenderness - Abdomen is non-tender to palpation. Rigidity (guarding)  - Abdomen is soft. Auscultation Auscultation of the abdomen reveals - Bowel sounds normal.  Male Genitourinary Note: Not done, not pertinent to present illness   Musculoskeletal Note: His hips show normal range of motion, no discomfort. His left knee which is recently operated on by Dr. Theda Sers has well-healed portal incisions. There is no effusion. His range  is 0 to 125, but instability or tenderness. Right knee, no effusion, varus deformity range 5 to 25, marked crepitus on range of motion and tenderness medial greater than lateral with no instability noted. Pulse sensation and motor intact.  RADIOGRAPHS AP both knees and lateral taken few months ago by Dr. Theda Sers show that he has bone-on-bone arthritis in the medial and patellofemoral compartments of the right knee with varus deformity and tibial subluxation.     Assessment & Plan Primary osteoarthritis of right knee (M17.11)  Note:Surgical Plans: Right Total Knee Replacement  Disposition: Home, Straight to outpatient therpay at [facility] on June 29th.  PCP: Dr. [name]  Topical TXA - History of DVT and PE  Anesthesia Issues: None Please note history of Atrial Fib.  Patient was instructed on what medications to stop prior to surgery.  Signed electronically by Joelene Millin, III PA-C

## 2017-06-20 NOTE — Telephone Encounter (Signed)
Pt currently admitted. Will await discharge instructions before submitting refills

## 2017-06-20 NOTE — Anesthesia Preprocedure Evaluation (Signed)
Anesthesia Evaluation  Patient identified by MRN, date of birth, ID band Patient awake    Reviewed: Allergy & Precautions, NPO status , Patient's Chart, lab work & pertinent test results  Airway Mallampati: II  TM Distance: >3 FB Neck ROM: Full    Dental no notable dental hx.    Pulmonary neg pulmonary ROS, sleep apnea , former smoker,    Pulmonary exam normal breath sounds clear to auscultation       Cardiovascular hypertension, Pt. on medications negative cardio ROS Normal cardiovascular exam Rhythm:Regular Rate:Normal     Neuro/Psych negative neurological ROS  negative psych ROS   GI/Hepatic negative GI ROS, Neg liver ROS, GERD  ,  Endo/Other  negative endocrine ROSdiabetes  Renal/GU negative Renal ROS  negative genitourinary   Musculoskeletal negative musculoskeletal ROS (+)   Abdominal   Peds negative pediatric ROS (+)  Hematology negative hematology ROS (+)   Anesthesia Other Findings   Reproductive/Obstetrics negative OB ROS                             Anesthesia Physical Anesthesia Plan  ASA: III  Anesthesia Plan: General   Post-op Pain Management:  Regional for Post-op pain and GA combined w/ Regional for post-op pain   Induction: Intravenous  PONV Risk Score and Plan: 2 and Ondansetron, Dexamethasone and Propofol  Airway Management Planned: LMA  Additional Equipment:   Intra-op Plan:   Post-operative Plan: Extubation in OR  Informed Consent: I have reviewed the patients History and Physical, chart, labs and discussed the procedure including the risks, benefits and alternatives for the proposed anesthesia with the patient or authorized representative who has indicated his/her understanding and acceptance.   Dental advisory given  Plan Discussed with: CRNA  Anesthesia Plan Comments:         Anesthesia Quick Evaluation

## 2017-06-21 LAB — GLUCOSE, CAPILLARY
Glucose-Capillary: 195 mg/dL — ABNORMAL HIGH (ref 65–99)
Glucose-Capillary: 231 mg/dL — ABNORMAL HIGH (ref 65–99)
Glucose-Capillary: 226 mg/dL — ABNORMAL HIGH (ref 65–99)
Glucose-Capillary: 198 mg/dL — ABNORMAL HIGH (ref 65–99)

## 2017-06-21 LAB — CBC
HCT: 33 % — ABNORMAL LOW (ref 39.0–52.0)
Hemoglobin: 10.7 g/dL — ABNORMAL LOW (ref 13.0–17.0)
MCH: 26 pg (ref 26.0–34.0)
MCHC: 32.4 g/dL (ref 30.0–36.0)
MCV: 80.1 fL (ref 78.0–100.0)
Platelets: 120 10*3/uL — ABNORMAL LOW (ref 150–400)
RBC: 4.12 MIL/uL — ABNORMAL LOW (ref 4.22–5.81)
RDW: 16 % — ABNORMAL HIGH (ref 11.5–15.5)
WBC: 11.3 10*3/uL — ABNORMAL HIGH (ref 4.0–10.5)

## 2017-06-21 LAB — BASIC METABOLIC PANEL
Anion gap: 6 (ref 5–15)
BUN: 18 mg/dL (ref 6–20)
CO2: 29 mmol/L (ref 22–32)
Calcium: 8.7 mg/dL — ABNORMAL LOW (ref 8.9–10.3)
Chloride: 104 mmol/L (ref 101–111)
Creatinine, Ser: 1.19 mg/dL (ref 0.61–1.24)
GFR calc Af Amer: >60 mL/min (ref >60)
GFR calc non Af Amer: >60 mL/min (ref >60)
Glucose, Bld: 210 mg/dL — ABNORMAL HIGH (ref 65–99)
Potassium: 3.9 mmol/L (ref 3.5–5.1)
Sodium: 139 mmol/L (ref 135–145)

## 2017-06-21 MED ORDER — SODIUM CHLORIDE 0.9 % IV BOLUS (SEPSIS)
250.0000 mL | Freq: Once | INTRAVENOUS | Status: AC
  Administered 2017-06-21: 250 mL via INTRAVENOUS

## 2017-06-21 MED ORDER — DIAZEPAM 5 MG PO TABS: 5.0000 mg | ORAL_TABLET | Freq: Four times a day (QID) | ORAL | 0 refills | Status: DC | PRN

## 2017-06-21 MED ORDER — RIVAROXABAN 10 MG PO TABS: 10.0000 mg | ORAL_TABLET | Freq: Every day | ORAL | 0 refills | Status: DC

## 2017-06-21 MED ORDER — TRAMADOL HCL 50 MG PO TABS: 50.0000 mg | ORAL_TABLET | Freq: Four times a day (QID) | ORAL | 0 refills | Status: DC | PRN

## 2017-06-21 MED ORDER — ESOMEPRAZOLE MAGNESIUM 20 MG PO CPDR
40.0000 mg | DELAYED_RELEASE_CAPSULE | Freq: Two times a day (BID) | ORAL | Status: DC
  Administered 2017-06-21 – 2017-06-22 (×3): 40 mg via ORAL
  Filled 2017-06-21 (×4): qty 2

## 2017-06-21 MED ORDER — DIAZEPAM 5 MG PO TABS
5.0000 mg | ORAL_TABLET | Freq: Four times a day (QID) | ORAL | Status: DC | PRN
  Administered 2017-06-21 (×2): 10 mg via ORAL
  Filled 2017-06-21 (×2): qty 2

## 2017-06-21 NOTE — Evaluation (Signed)
Occupational Therapy Evaluation Patient Details Name: Jerry Parsons MRN: [id number] DOB: [birth year] Today's Date: 06/21/2017    History of Present Illness Pt is a 66 year old male s/p R TKA with hx including but not limited to OSA, DM, DVT/PE   Clinical Impression   Pt was admitted for the above sx. He will benefit from continued OT in acute setting to increase safety and independence with bathroom transfers.  Pt had c/o dizziness and low diastolic BP (see ADL comments below). Goals are for min guard A.    Follow Up Recommendations  Supervision/Assistance - 24 hour    Equipment Recommendations  None recommended by OT    Recommendations for Other Services       Precautions / Restrictions Precautions Precautions: Fall;Knee Precaution Comments: instructed on proper application and use of KI for ambulation Required Braces or Orthoses: Knee Immobilizer - Right Knee Immobilizer - Right: Discontinue once straight leg raise with < 10 degree lag Restrictions Weight Bearing Restrictions: No Other Position/Activity Restrictions: WBAT      Mobility Bed Mobility         Supine to sit: Min assist;HOB elevated     General bed mobility comments: for RLE  Transfers Overall transfer level: Needs assistance Equipment used: Rolling walker (2 wheeled) Transfers: Sit to/from [facility] Sit to Stand: Min assist Stand pivot transfers: Min assist       General transfer comment: CUES for UE/LE placement    Balance                                           ADL either performed or assessed with clinical judgement   ADL Overall ADL's : Needs assistance/impaired     Grooming: Set up;Sitting   Upper Body Bathing: Set up;Sitting   Lower Body Bathing: Moderate assistance;Sit to/from stand   Upper Body Dressing : Minimal assistance;Sitting (lines)   Lower Body Dressing: Maximal assistance;Sit to/from stand   Toilet Transfer: Minimal  assistance;RW;Stand-pivot (chair)   Toileting- Clothing Manipulation and Hygiene: Moderate assistance;Sit to/from stand         General ADL Comments: wife present and she will help with adls as needed at home. Pt felt dizzy when sitting up.  Sitting BP 112/56. Standing was  112/56.  Only performed SPT due to this     Vision         Perception     Praxis      Pertinent Vitals/Pain Pain Assessment: 0-10 Pain Score: 10-Worst pain ever Pain Location: R knee Pain Descriptors / Indicators: Operative site guarding;Tender Pain Intervention(s): Limited activity within patient's tolerance;Monitored during session;Premedicated before session;Repositioned;Ice applied     Hand Dominance     Extremity/Trunk Assessment Upper Extremity Assessment Upper Extremity Assessment: Overall WFL for tasks assessed           Communication Communication Communication: No difficulties   Cognition Arousal/Alertness: Awake/alert Behavior During Therapy: WFL for tasks assessed/performed Overall Cognitive Status: Within Functional Limits for tasks assessed                                     General Comments       Exercises     Shoulder Instructions      Home Living Family/patient expects to be discharged to:: Private residence Living Arrangements: Spouse/significant  other Available Help at Discharge: Family;Available 24 hours/day Type of Home: House             Bathroom Shower/Tub: Walk-in shower   Bathroom Toilet: Handicapped height     Home Equipment: Bedside commode;Shower seat          Prior Functioning/Environment Level of Independence: Independent                 OT Problem List: Decreased strength;Decreased activity tolerance;Pain;Decreased knowledge of use of DME or AE      OT Treatment/Interventions: Self-care/ADL training;DME and/or AE instruction;Patient/family education;Balance training;Therapeutic activities    OT Goals(Current goals  can be found in the care plan section) Acute Rehab OT Goals Patient Stated Goal: less pain OT Goal Formulation: With patient Time For Goal Achievement: 06/28/17 Potential to Achieve Goals: Good ADL Goals Pt Will Transfer to Toilet: with min guard assist;bedside commode;ambulating Pt Will Perform Tub/Shower Transfer: with min assist;Shower transfer;ambulating;shower seat  OT Frequency: Min 2X/week   Barriers to D/C:            Co-evaluation              AM-PAC PT "6 Clicks" Daily Activity     Outcome Measure Help from another person eating meals?: None Help from another person taking care of personal grooming?: A Little Help from another person toileting, which includes using toliet, bedpan, or urinal?: A Lot Help from another person bathing (including washing, rinsing, drying)?: A Lot Help from another person to put on and taking off regular upper body clothing?: A Little Help from another person to put on and taking off regular lower body clothing?: A Lot 6 Click Score: 16   End of Session    Activity Tolerance: Patient limited by pain (BP and dizziness) Patient left: in chair;with call bell/phone within reach;with family/visitor present  OT Visit Diagnosis: Pain Pain - Right/Left: Right Pain - part of body: Knee                Time: 9702-6378 OT Time Calculation (min): 28 min Charges:  OT General Charges $OT Visit: 1 Procedure OT Evaluation $OT Eval Low Complexity: 1 Procedure OT Treatments $Therapeutic Activity: 8-22 mins G-Codes:     [place], OTR/L [phone] 06/21/2017  Yobani Schertzer 06/21/2017, 12:54 PM

## 2017-06-21 NOTE — Telephone Encounter (Signed)
Patient currently admitted at this time. 

## 2017-06-21 NOTE — Progress Notes (Signed)
Physical Therapy Treatment Patient Details Name: Jerry Parsons MRN: [id number] DOB: [birth year] Today's Date: 06/21/2017    History of Present Illness Pt is a 66 year old male s/p R TKA with hx including but not limited to OSA, DM, DVT/PE    PT Comments    POD # 1 pm session Pain improved.  Assisted with amb a greater distance then back to bed for some TKR TE's followed by ICE.  Follow Up Recommendations  DC plan and follow up therapy as arranged by surgeon;Outpatient PT     Equipment Recommendations  None recommended by PT    Recommendations for Other Services       Precautions / Restrictions Precautions Precautions: Fall;Knee Precaution Comments: instructed on proper application and use of KI for amb and stairs Required Braces or Orthoses: Knee Immobilizer - Right Knee Immobilizer - Right: Discontinue once straight leg raise with < 10 degree lag Restrictions Weight Bearing Restrictions: No Other Position/Activity Restrictions: WBAT    Mobility  Bed Mobility         Supine to sit: Min assist;HOB elevated     General bed mobility comments: for RLE  Transfers Overall transfer level: Needs assistance Equipment used: Rolling walker (2 wheeled) Transfers: Sit to/from [facility] Sit to Stand: Min assist Stand pivot transfers: Min assist       General transfer comment: CUES for UE/LE placement  Ambulation/Gait Ambulation/Gait assistance: Min guard;+2 safety/equipment Ambulation Distance (Feet): 58 Feet Assistive device: Rolling walker (2 wheeled) Gait Pattern/deviations: Step-to pattern;Decreased stance time - right;Antalgic Gait velocity: decreased    Tolerated an increased distance.   Stairs            Wheelchair Mobility    Modified Rankin (Stroke Patients Only)       Balance                                            Cognition Arousal/Alertness: Awake/alert Behavior During Therapy: WFL for tasks  assessed/performed Overall Cognitive Status: Within Functional Limits for tasks assessed                                        Exercises   Total Knee Replacement TE's 10 reps B LE ankle pumps 10 reps towel squeezes  8  reps knee presses   5  reps heel slides  10 reps SLR's AAROM 10 reps ABD AAROM Followed by ICE     General Comments        Pertinent Vitals/Pain Pain Assessment: 0-10 Pain Score: 10-Worst pain ever Pain Location: R knee Pain Descriptors / Indicators: Operative site guarding;Tender Pain Intervention(s): Limited activity within patient's tolerance;Monitored during session;Premedicated before session;Repositioned;Ice applied    Home Living Family/patient expects to be discharged to:: Private residence Living Arrangements: Spouse/significant other Available Help at Discharge: Family;Available 24 hours/day Type of Home: House       Home Equipment: Bedside commode;Shower seat      Prior Function Level of Independence: Independent          PT Goals (current goals can now be found in the care plan section) Acute Rehab PT Goals Patient Stated Goal: less pain Progress towards PT goals: Progressing toward goals    Frequency    7X/week      PT Plan  Current plan remains appropriate    Co-evaluation              AM-PAC PT "6 Clicks" Daily Activity  Outcome Measure  Difficulty turning over in bed (including adjusting bedclothes, sheets and blankets)?: Total Difficulty moving from lying on back to sitting on the side of the bed? : Total Difficulty sitting down on and standing up from a chair with arms (e.g., wheelchair, bedside commode, etc,.)?: Total Help needed moving to and from a bed to chair (including a wheelchair)?: A Lot Help needed walking in hospital room?: A Lot Help needed climbing 3-5 steps with a railing? : A Lot 6 Click Score: 9    End of Session Equipment Utilized During Treatment: Right knee immobilizer;Gait  belt Activity Tolerance: Patient tolerated treatment well Patient left: in chair;with call bell/phone within reach;with family/visitor present Nurse Communication: Mobility status PT Visit Diagnosis: Other abnormalities of gait and mobility (R26.89);Pain     Time: 1445-1510 PT Time Calculation (min) (ACUTE ONLY): 25 min  Charges:  $Gait Training: 8-22 mins $Therapeutic Exercise: 8-22 mins                    G Codes:       Rica Koyanagi  PTA [facility]  Acute  Rehab Pager      [phone]

## 2017-06-21 NOTE — Discharge Summary (Signed)
Physician Discharge Summary   Patient ID: Jerry Parsons MRN: [id number] DOB/AGE: [birth year] 66 y.o.  Admit date: 06/20/2017 Discharge date: 06/22/2017  Primary Diagnosis:  Osteoarthritis  Right knee(s)  Admission Diagnoses:  Past Medical History:  Diagnosis Date  . Arrhythmia    tachycardia, A-Fib  . Arthritis   . Blind right eye   . BPH (benign prostatic hyperplasia)   . Diabetes mellitus   . DVT (deep venous thrombosis) ([place]) 02/2010   leg thrombus ; dislodged into emboli and caused PE  . Dysrhythmia   . 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 GERD (gastroesophageal reflux disease)   . Hypercholesteremia   . Hypertension   . Kidney failure   . Neuropathy   . Pneumonia    time 9 ;last episode 12/2015  . Pulmonary embolus ([place]) 2011  . Seasonal allergies   . Seizures ([place])    as child   . Sleep apnea    BIPAP  . Stiff neck    limited turning s/p titanium plate placement  . TIA (transient ischemic attack)   . Wears dentures    full upper and lower   Discharge Diagnoses:   Active Problems:   OA (osteoarthritis) of knee  Estimated body mass index is 36.82 kg/m as calculated from the following:   Height as of this encounter: 5' 11"  (1.803 m).   Weight as of this encounter: 119.7 kg (264 lb).  Procedure:  Procedure(s) (LRB): RIGHT TOTAL KNEE ARTHROPLASTY (Right)   Consults: None  HPI: Jerry Parsons is a 66 y.o. year old male with end stage OA of his right knee with progressively worsening pain and dysfunction. He has constant pain, with activity and at rest and significant functional deficits with difficulties even with ADLs. He has had extensive non-op management including analgesics, injections of cortisone and viscosupplements, and home exercise program, but remains in significant pain with significant dysfunction. Radiographs show bone on bone arthritis medial and patellofemoral. He presents now for right Total Knee Arthroplasty.    Laboratory  Data: Admission on 06/20/2017  Component Date Value Ref Range Status  . Glucose-Capillary 06/20/2017 146* 65 - 99 mg/dL Final  . Glucose-Capillary 06/20/2017 144* 65 - 99 mg/dL Final  . Glucose-Capillary 06/20/2017 176* 65 - 99 mg/dL Final  . Glucose-Capillary 06/20/2017 262* 65 - 99 mg/dL Final  . WBC 06/21/2017 11.3* 4.0 - 10.5 K/uL Final  . RBC 06/21/2017 4.12* 4.22 - 5.81 MIL/uL Final  . Hemoglobin 06/21/2017 10.7* 13.0 - 17.0 g/dL Final  . HCT 06/21/2017 33.0* 39.0 - 52.0 % Final  . MCV 06/21/2017 80.1  78.0 - 100.0 fL Final  . MCH 06/21/2017 26.0  26.0 - 34.0 pg Final  . MCHC 06/21/2017 32.4  30.0 - 36.0 g/dL Final  . RDW 06/21/2017 16.0* 11.5 - 15.5 % Final  . Platelets 06/21/2017 120* 150 - 400 K/uL Final  . Sodium 06/21/2017 139  135 - 145 mmol/L Final  . Potassium 06/21/2017 3.9  3.5 - 5.1 mmol/L Final  . Chloride 06/21/2017 104  101 - 111 mmol/L Final  . CO2 06/21/2017 29  22 - 32 mmol/L Final  . Glucose, Bld 06/21/2017 210* 65 - 99 mg/dL Final  . BUN 06/21/2017 18  6 - 20 mg/dL Final  . Creatinine, Ser 06/21/2017 1.19  0.61 - 1.24 mg/dL Final  . Calcium 06/21/2017 8.7* 8.9 - 10.3 mg/dL Final  . GFR calc non Af Amer 06/21/2017 >60  >60 mL/min Final  .  GFR calc Af Amer 06/21/2017 >60  >60 mL/min Final   Comment: (NOTE) The eGFR has been calculated using the CKD EPI equation. This calculation has not been validated in all clinical situations. eGFR's persistently <60 mL/min signify possible Chronic Kidney Disease.   . Anion gap 06/21/2017 6  5 - 15 Final  . Glucose-Capillary 06/20/2017 308* 65 - 99 mg/dL Final  . Glucose-Capillary 06/21/2017 195* 65 - 99 mg/dL Final  . Glucose-Capillary 06/21/2017 231* 65 - 99 mg/dL Final  . Glucose-Capillary 06/21/2017 226* 65 - 99 mg/dL Final  . Glucose-Capillary 06/21/2017 198* 65 - 99 mg/dL Final  Hospital Outpatient Visit on 06/16/2017  Component Date Value Ref Range Status  . Glucose-Capillary 06/16/2017 149* 65 - 99 mg/dL Final    . aPTT 06/16/2017 32  24 - 36 seconds Final  . WBC 06/16/2017 9.5  4.0 - 10.5 K/uL Final  . RBC 06/16/2017 4.76  4.22 - 5.81 MIL/uL Final  . Hemoglobin 06/16/2017 12.2* 13.0 - 17.0 g/dL Final  . HCT 06/16/2017 39.0  39.0 - 52.0 % Final  . MCV 06/16/2017 81.9  78.0 - 100.0 fL Final  . MCH 06/16/2017 25.6* 26.0 - 34.0 pg Final  . MCHC 06/16/2017 31.3  30.0 - 36.0 g/dL Final  . RDW 06/16/2017 15.9* 11.5 - 15.5 % Final  . Platelets 06/16/2017 142* 150 - 400 K/uL Final  . Sodium 06/16/2017 141  135 - 145 mmol/L Final  . Potassium 06/16/2017 4.2  3.5 - 5.1 mmol/L Final  . Chloride 06/16/2017 101  101 - 111 mmol/L Final  . CO2 06/16/2017 32  22 - 32 mmol/L Final  . Glucose, Bld 06/16/2017 104* 65 - 99 mg/dL Final  . BUN 06/16/2017 26* 6 - 20 mg/dL Final  . Creatinine, Ser 06/16/2017 1.40* 0.61 - 1.24 mg/dL Final  . Calcium 06/16/2017 9.8  8.9 - 10.3 mg/dL Final  . Total Protein 06/16/2017 7.4  6.5 - 8.1 g/dL Final  . Albumin 06/16/2017 4.2  3.5 - 5.0 g/dL Final  . AST 06/16/2017 31  15 - 41 U/L Final  . ALT 06/16/2017 38  17 - 63 U/L Final  . Alkaline Phosphatase 06/16/2017 89  38 - 126 U/L Final  . Total Bilirubin 06/16/2017 0.5  0.3 - 1.2 mg/dL Final  . GFR calc non Af Amer 06/16/2017 50* >60 mL/min Final  . GFR calc Af Amer 06/16/2017 59* >60 mL/min Final   Comment: (NOTE) The eGFR has been calculated using the CKD EPI equation. This calculation has not been validated in all clinical situations. eGFR's persistently <60 mL/min signify possible Chronic Kidney Disease.   . Anion gap 06/16/2017 8  5 - 15 Final  . Prothrombin Time 06/16/2017 12.9  11.4 - 15.2 seconds Final  . INR 06/16/2017 0.97   Final  . ABO/RH(D) 06/16/2017 O POS   Final  . Antibody Screen 06/16/2017 NEG   Final  . Sample Expiration 06/16/2017 06/23/2017   Final  . Extend sample reason 06/16/2017 NO TRANSFUSIONS OR PREGNANCY IN THE PAST 3 MONTHS   Final  . MRSA, PCR 06/16/2017 NEGATIVE  NEGATIVE Final  .  Staphylococcus aureus 06/16/2017 NEGATIVE  NEGATIVE Final   Comment:        The Xpert SA Assay (FDA approved for NASAL specimens in patients over 16 years of age), is one component of a comprehensive surveillance program.  Test performance has been validated by [facility] for patients greater than or equal to 62 year old. It is not  intended to diagnose infection nor to guide or monitor treatment.   Office Visit on 06/09/2017  Component Date Value Ref Range Status  . Hemoglobin A1C 06/09/2017 5.9   Final  . Est. average glucose Bld gHb Est-m* 06/09/2017 123   Final  Admission on 05/07/2017, Discharged on 05/10/2017  Component Date Value Ref Range Status  . WBC 05/07/2017 10.6  3.8 - 10.6 K/uL Final  . RBC 05/07/2017 5.00  4.40 - 5.90 MIL/uL Final  . Hemoglobin 05/07/2017 13.0  13.0 - 18.0 g/dL Final  . HCT 05/07/2017 39.7* 40.0 - 52.0 % Final  . MCV 05/07/2017 79.4* 80.0 - 100.0 fL Final  . MCH 05/07/2017 26.0  26.0 - 34.0 pg Final  . MCHC 05/07/2017 32.7  32.0 - 36.0 g/dL Final  . RDW 05/07/2017 17.7* 11.5 - 14.5 % Final  . Platelets 05/07/2017 117* 150 - 440 K/uL Final  . Sodium 05/07/2017 135  135 - 145 mmol/L Final  . Potassium 05/07/2017 3.8  3.5 - 5.1 mmol/L Final  . Chloride 05/07/2017 96* 101 - 111 mmol/L Final  . CO2 05/07/2017 29  22 - 32 mmol/L Final  . Glucose, Bld 05/07/2017 206* 65 - 99 mg/dL Final  . BUN 05/07/2017 35* 6 - 20 mg/dL Final  . Creatinine, Ser 05/07/2017 1.71* 0.61 - 1.24 mg/dL Final  . Calcium 05/07/2017 8.8* 8.9 - 10.3 mg/dL Final  . Total Protein 05/07/2017 6.9  6.5 - 8.1 g/dL Final  . Albumin 05/07/2017 3.8  3.5 - 5.0 g/dL Final  . AST 05/07/2017 34  15 - 41 U/L Final  . ALT 05/07/2017 27  17 - 63 U/L Final  . Alkaline Phosphatase 05/07/2017 68  38 - 126 U/L Final  . Total Bilirubin 05/07/2017 0.8  0.3 - 1.2 mg/dL Final  . GFR calc non Af Amer 05/07/2017 40* >60 mL/min Final  . GFR calc Af Amer 05/07/2017 46* >60 mL/min Final   Comment:  (NOTE) The eGFR has been calculated using the CKD EPI equation. This calculation has not been validated in all clinical situations. eGFR's persistently <60 mL/min signify possible Chronic Kidney Disease.   . Anion gap 05/07/2017 10  5 - 15 Final  . Campylobacter species 05/08/2017 NOT DETECTED  NOT DETECTED Final  . Plesimonas shigelloides 05/08/2017 NOT DETECTED  NOT DETECTED Final  . Salmonella species 05/08/2017 DETECTED* NOT DETECTED Final   Comment: RESULT CALLED TO, READ BACK BY AND VERIFIED WITH: ANESSA MACROHON AT 4166 05/08/17.PMH   . Yersinia enterocolitica 05/08/2017 NOT DETECTED  NOT DETECTED Final  . Vibrio species 05/08/2017 NOT DETECTED  NOT DETECTED Final  . Vibrio cholerae 05/08/2017 NOT DETECTED  NOT DETECTED Final  . Enteroaggregative E coli (EAEC) 05/08/2017 NOT DETECTED  NOT DETECTED Final  . Enteropathogenic E coli (EPEC) 05/08/2017 NOT DETECTED  NOT DETECTED Final  . Enterotoxigenic E coli (ETEC) 05/08/2017 NOT DETECTED  NOT DETECTED Final  . Shiga like toxin producing E coli * 05/08/2017 NOT DETECTED  NOT DETECTED Final  . Shigella/Enteroinvasive E coli (EI* 05/08/2017 NOT DETECTED  NOT DETECTED Final  . Cryptosporidium 05/08/2017 NOT DETECTED  NOT DETECTED Final  . Cyclospora cayetanensis 05/08/2017 NOT DETECTED  NOT DETECTED Final  . Entamoeba histolytica 05/08/2017 NOT DETECTED  NOT DETECTED Final  . Giardia lamblia 05/08/2017 NOT DETECTED  NOT DETECTED Final  . Adenovirus F40/41 05/08/2017 NOT DETECTED  NOT DETECTED Final  . Astrovirus 05/08/2017 NOT DETECTED  NOT DETECTED Final  . Norovirus GI/GII 05/08/2017 NOT DETECTED  NOT  DETECTED Final  . Rotavirus A 05/08/2017 NOT DETECTED  NOT DETECTED Final  . Sapovirus (I, II, IV, and V) 05/08/2017 NOT DETECTED  NOT DETECTED Final  . Specimen Description 05/07/2017 BLOOD RIGHT FOREARM   Final  . Special Requests 05/07/2017 BOTTLES DRAWN AEROBIC AND ANAEROBIC Blood Culture adequate volume   Final  . Culture  05/07/2017 NO GROWTH 5 DAYS   Final  . Report Status 05/07/2017 05/12/2017 FINAL   Final  . Specimen Description 05/07/2017 BLOOD RIGHT ANTECUBITAL   Final  . Special Requests 05/07/2017 BOTTLES DRAWN AEROBIC AND ANAEROBIC Blood Culture results may not be optimal due to an inadequate volume of blood received in culture bottles   Final  . Culture 05/07/2017 NO GROWTH 5 DAYS   Final  . Report Status 05/07/2017 05/12/2017 FINAL   Final  . Lactic Acid, Venous 05/07/2017 2.1* 0.5 - 1.9 mmol/L Final   Comment: CRITICAL RESULT CALLED TO, READ BACK BY AND VERIFIED WITH SUSAN NEAL AT 2120 ON 05/07/17 RWW   . Color, Urine 05/07/2017 YELLOW* YELLOW Final  . APPearance 05/07/2017 CLEAR* CLEAR Final  . Specific Gravity, Urine 05/07/2017 1.020  1.005 - 1.030 Final  . pH 05/07/2017 5.0  5.0 - 8.0 Final  . Glucose, UA 05/07/2017 50* NEGATIVE mg/dL Final  . Hgb urine dipstick 05/07/2017 NEGATIVE  NEGATIVE Final  . Bilirubin Urine 05/07/2017 NEGATIVE  NEGATIVE Final  . Ketones, ur 05/07/2017 NEGATIVE  NEGATIVE mg/dL Final  . Protein, ur 05/07/2017 30* NEGATIVE mg/dL Final  . Nitrite 05/07/2017 NEGATIVE  NEGATIVE Final  . Leukocytes, UA 05/07/2017 NEGATIVE  NEGATIVE Final  . RBC / HPF 05/07/2017 NONE SEEN  0 - 5 RBC/hpf Final  . WBC, UA 05/07/2017 0-5  0 - 5 WBC/hpf Final  . Bacteria, UA 05/07/2017 NONE SEEN  NONE SEEN Final  . Squamous Epithelial / LPF 05/07/2017 NONE SEEN  NONE SEEN Final  . Influenza A By PCR 05/07/2017 NEGATIVE  NEGATIVE Final  . Influenza B By PCR 05/07/2017 NEGATIVE  NEGATIVE Final   Comment: (NOTE) The Xpert Xpress Flu assay is intended as an aid in the diagnosis of  influenza and should not be used as a sole basis for treatment.  This  assay is FDA approved for nasopharyngeal swab specimens only. Nasal  washings and aspirates are unacceptable for Xpert Xpress Flu testing.   . Lactic Acid, Venous 05/08/2017 1.8  0.5 - 1.9 mmol/L Final  . Lactic Acid, Venous 05/08/2017 1.8  0.5 -  1.9 mmol/L Final  . Procalcitonin 05/08/2017 1.05  ng/mL Final   Comment:        Interpretation: PCT > 0.5 ng/mL and <= 2 ng/mL: Systemic infection (sepsis) is possible, but other conditions are known to elevate PCT as well. (NOTE)         ICU PCT Algorithm               Non ICU PCT Algorithm    ----------------------------     ------------------------------         PCT < 0.25 ng/mL                 PCT < 0.1 ng/mL     Stopping of antibiotics            Stopping of antibiotics       strongly encouraged.               strongly encouraged.    ----------------------------     ------------------------------  PCT level decrease by               PCT < 0.25 ng/mL       >= 80% from peak PCT       OR PCT 0.25 - 0.5 ng/mL          Stopping of antibiotics                                             encouraged.     Stopping of antibiotics           encouraged.    ----------------------------     ------------------------------       PCT level decrease by              PCT >= 0.25 ng/mL       < 80% from peak PCT        AND PCT >= 0.5 ng/mL                                      Continuing antibiotics                                              encouraged.       Continuing antibiotics            encouraged.    ----------------------------     ------------------------------     PCT level increase compared          PCT > 0.5 ng/mL         with peak PCT AND          PCT >= 0.5 ng/mL             Escalation of antibiotics                                          strongly encouraged.      Escalation of antibiotics        strongly encouraged.   . Prothrombin Time 05/08/2017 16.2* 11.4 - 15.2 seconds Final  . INR 05/08/2017 1.29   Final  . aPTT 05/08/2017 37* 24 - 36 seconds Final   Comment:        IF BASELINE aPTT IS ELEVATED, SUGGEST PATIENT RISK ASSESSMENT BE USED TO DETERMINE APPROPRIATE ANTICOAGULANT THERAPY.   . Magnesium 05/08/2017 1.8  1.7 - 2.4 mg/dL Final  . Phosphorus 05/08/2017  2.2* 2.5 - 4.6 mg/dL Final  . Sodium 05/08/2017 137  135 - 145 mmol/L Final  . Potassium 05/08/2017 3.1* 3.5 - 5.1 mmol/L Final  . Chloride 05/08/2017 102  101 - 111 mmol/L Final  . CO2 05/08/2017 26  22 - 32 mmol/L Final  . Glucose, Bld 05/08/2017 179* 65 - 99 mg/dL Final  . BUN 05/08/2017 32* 6 - 20 mg/dL Final  . Creatinine, Ser 05/08/2017 1.62* 0.61 - 1.24 mg/dL Final  . Calcium 05/08/2017 7.9* 8.9 - 10.3 mg/dL Final  . GFR calc non Af Amer 05/08/2017 42* >60 mL/min Final  . GFR calc Af Amer 05/08/2017 49* >60 mL/min  Final   Comment: (NOTE) The eGFR has been calculated using the CKD EPI equation. This calculation has not been validated in all clinical situations. eGFR's persistently <60 mL/min signify possible Chronic Kidney Disease.   . Anion gap 05/08/2017 9  5 - 15 Final  . WBC 05/08/2017 6.8  3.8 - 10.6 K/uL Final  . RBC 05/08/2017 4.35* 4.40 - 5.90 MIL/uL Final  . Hemoglobin 05/08/2017 11.6* 13.0 - 18.0 g/dL Final  . HCT 05/08/2017 35.2* 40.0 - 52.0 % Final  . MCV 05/08/2017 80.7  80.0 - 100.0 fL Final  . MCH 05/08/2017 26.6  26.0 - 34.0 pg Final  . MCHC 05/08/2017 32.9  32.0 - 36.0 g/dL Final  . RDW 05/08/2017 17.2* 11.5 - 14.5 % Final  . Platelets 05/08/2017 109* 150 - 440 K/uL Final  . Specimen Description 05/08/2017 BLOOD RIGHT ARM   Final  . Special Requests 05/08/2017 BOTTLES DRAWN AEROBIC AND ANAEROBIC Blood Culture adequate volume   Final  . Culture 05/08/2017 NO GROWTH 5 DAYS   Final  . Report Status 05/08/2017 05/13/2017 FINAL   Final  . Specimen Description 05/08/2017 BLOOD LEFT HAND   Final  . Special Requests 05/08/2017 BOTTLES DRAWN AEROBIC AND ANAEROBIC Blood Culture adequate volume Immunocompromised   Final  . Culture 05/08/2017 NO GROWTH 5 DAYS   Final  . Report Status 05/08/2017 05/13/2017 FINAL   Final  . HIV Screen 4th Generation wRfx 05/08/2017 Non Reactive  Non Reactive Final   Comment: (NOTE) Performed At: [facility] [place], [place] [id number] Lindon Romp MD PJ:[id number]   . Glucose-Capillary 05/08/2017 152* 65 - 99 mg/dL Final  . Comment 1 05/08/2017 Notify RN   Final  . C Diff antigen 05/08/2017 NEGATIVE  NEGATIVE Final  . C Diff toxin 05/08/2017 NEGATIVE  NEGATIVE Final  . C Diff interpretation 05/08/2017 No C. difficile detected.   Final  . Glucose-Capillary 05/08/2017 150* 65 - 99 mg/dL Final  . Comment 1 05/08/2017 Notify RN   Final  . Glucose-Capillary 05/08/2017 200* 65 - 99 mg/dL Final  . Comment 1 05/08/2017 Notify RN   Final  . Glucose-Capillary 05/08/2017 176* 65 - 99 mg/dL Final  . Comment 1 05/08/2017 Notify RN   Final  . Sodium 05/09/2017 138  135 - 145 mmol/L Final  . Potassium 05/09/2017 3.1* 3.5 - 5.1 mmol/L Final  . Chloride 05/09/2017 101  101 - 111 mmol/L Final  . CO2 05/09/2017 29  22 - 32 mmol/L Final  . Glucose, Bld 05/09/2017 147* 65 - 99 mg/dL Final  . BUN 05/09/2017 20  6 - 20 mg/dL Final  . Creatinine, Ser 05/09/2017 1.22  0.61 - 1.24 mg/dL Final  . Calcium 05/09/2017 8.3* 8.9 - 10.3 mg/dL Final  . GFR calc non Af Amer 05/09/2017 60* >60 mL/min Final  . GFR calc Af Amer 05/09/2017 >60  >60 mL/min Final   Comment: (NOTE) The eGFR has been calculated using the CKD EPI equation. This calculation has not been validated in all clinical situations. eGFR's persistently <60 mL/min signify possible Chronic Kidney Disease.   . Anion gap 05/09/2017 8  5 - 15 Final  . WBC 05/09/2017 5.5  3.8 - 10.6 K/uL Final  . RBC 05/09/2017 4.25* 4.40 - 5.90 MIL/uL Final  . Hemoglobin 05/09/2017 11.5* 13.0 - 18.0 g/dL Final  . HCT 05/09/2017 34.0* 40.0 - 52.0 % Final  . MCV 05/09/2017 80.0  80.0 - 100.0 fL Final  . [facility] 05/09/2017  27.0  26.0 - 34.0 pg Final  . MCHC 05/09/2017 33.7  32.0 - 36.0 g/dL Final  . RDW 05/09/2017 17.2* 11.5 - 14.5 % Final  . Platelets 05/09/2017 104* 150 - 440 K/uL Final  . Neutrophils Relative % 05/09/2017 71  % Final  . Neutro Abs 05/09/2017 4.0  1.4 -  6.5 K/uL Final  . Lymphocytes Relative 05/09/2017 20  % Final  . Lymphs Abs 05/09/2017 1.1  1.0 - 3.6 K/uL Final  . Monocytes Relative 05/09/2017 8  % Final  . Monocytes Absolute 05/09/2017 0.4  0.2 - 1.0 K/uL Final  . Eosinophils Relative 05/09/2017 1  % Final  . Eosinophils Absolute 05/09/2017 0.0  0 - 0.7 K/uL Final  . Basophils Relative 05/09/2017 0  % Final  . Basophils Absolute 05/09/2017 0.0  0 - 0.1 K/uL Final  . Glucose-Capillary 05/08/2017 155* 65 - 99 mg/dL Final  . Glucose-Capillary 05/09/2017 136* 65 - 99 mg/dL Final  . Glucose-Capillary 05/09/2017 189* 65 - 99 mg/dL Final  . Glucose-Capillary 05/09/2017 111* 65 - 99 mg/dL Final  . Glucose-Capillary 05/09/2017 142* 65 - 99 mg/dL Final  . Glucose-Capillary 05/10/2017 105* 65 - 99 mg/dL Final  . Sodium 05/10/2017 138  135 - 145 mmol/L Final  . Potassium 05/10/2017 3.7  3.5 - 5.1 mmol/L Final  . Chloride 05/10/2017 104  101 - 111 mmol/L Final  . CO2 05/10/2017 28  22 - 32 mmol/L Final  . Glucose, Bld 05/10/2017 163* 65 - 99 mg/dL Final  . BUN 05/10/2017 13  6 - 20 mg/dL Final  . Creatinine, Ser 05/10/2017 1.04  0.61 - 1.24 mg/dL Final  . Calcium 05/10/2017 8.5* 8.9 - 10.3 mg/dL Final  . GFR calc non Af Amer 05/10/2017 >60  >60 mL/min Final  . GFR calc Af Amer 05/10/2017 >60  >60 mL/min Final   Comment: (NOTE) The eGFR has been calculated using the CKD EPI equation. This calculation has not been validated in all clinical situations. eGFR's persistently <60 mL/min signify possible Chronic Kidney Disease.   . Anion gap 05/10/2017 6  5 - 15 Final  . Glucose-Capillary 05/10/2017 153* 65 - 99 mg/dL Final     X-Rays:No results found.  EKG: Orders placed or performed in visit on 06/07/17  . EKG 12-Lead     Hospital Course: Jerry Parsons is a 66 y.o. who was admitted to [facility]. They were brought to the operating room on 06/20/2017 and underwent Procedure(s): RIGHT TOTAL KNEE ARTHROPLASTY.  Patient tolerated  the procedure well and was later transferred to the recovery room and then to the orthopaedic floor for postoperative care.  They were given PO and IV analgesics for pain control following their surgery.  They were given 24 hours of postoperative antibiotics of  Anti-infectives    Start     Dose/Rate Route Frequency Ordered Stop   06/20/17 1400  ceFAZolin (ANCEF) IVPB 2g/100 mL premix     2 g 200 mL/hr over 30 Minutes Intravenous Every 6 hours 06/20/17 1035 06/20/17 2015   06/20/17 0515  ceFAZolin (ANCEF) 2-4 GM/100ML-% IVPB    Comments:  [place] Session   : cabinet override      06/20/17 0515 06/20/17 0725   06/20/17 0510  ceFAZolin (ANCEF) IVPB 2g/100 mL premix     2 g 200 mL/hr over 30 Minutes Intravenous On call to O.R. 06/20/17 0510 06/20/17 0725     and started on DVT prophylaxis in the form of Xarelto.  PT and OT were ordered for total joint protocol.  Discharge planning consulted to help with postop disposition and equipment needs.  Patient had a tough night on the evening of surgery.  They started to get up OOB with therapy on day one. Hemovac drain was pulled without difficulty.  Continued to work with therapy into day two.  Dressing was changed on day two and the incision was healing well.  Patient was seen in rounds and was ready to go home.   Diet: Cardiac diet and Diabetic diet Activity:WBAT Follow-up:in 2 weeks Disposition - Home Discharged Condition: good   Discharge Instructions    Call MD / Call 911    Complete by:  As directed    If you experience chest pain or shortness of breath, CALL 911 and be transported to the hospital emergency room.  If you develope a fever above 101 F, pus (white drainage) or increased drainage or redness at the wound, or calf pain, call your surgeon's office.   Change dressing    Complete by:  As directed    Change dressing daily with sterile 4 x 4 inch gauze dressing and apply TED hose. Do not submerge the incision under water.    Constipation Prevention    Complete by:  As directed    Drink plenty of fluids.  Prune juice may be helpful.  You may use a stool softener, such as Colace (over the counter) 100 mg twice a day.  Use MiraLax (over the counter) for constipation as needed.   Diet - low sodium heart healthy    Complete by:  As directed    Diet Carb Modified    Complete by:  As directed    Discharge instructions    Complete by:  As directed    Take Xarelto for two and a half more weeks, then discontinue Xarelto. Once the patient has completed the Xarelto, they may resume the 81 mg Aspirin.   Pick up stool softner and laxative for home use following surgery while on pain medications. Do not submerge incision under water. Please use good hand washing techniques while changing dressing each day. May shower starting three days after surgery. Please use a clean towel to pat the incision dry following showers. Continue to use ice for pain and swelling after surgery. Do not use any lotions or creams on the incision until instructed by your surgeon.  Wear both TED hose on both legs during the day every day for three weeks, but may remove the TED hose at night at home.  Postoperative Constipation Protocol  Constipation - defined medically as fewer than three stools per week and severe constipation as less than one stool per week.  One of the most common issues patients have following surgery is constipation.  Even if you have a regular bowel pattern at home, your normal regimen is likely to be disrupted due to multiple reasons following surgery.  Combination of anesthesia, postoperative narcotics, change in appetite and fluid intake all can affect your bowels.  In order to avoid complications following surgery, here are some recommendations in order to help you during your recovery period.  Colace (docusate) - Pick up an over-the-counter form of Colace or another stool softener and take twice a day as long as you are  requiring postoperative pain medications.  Take with a full glass of water daily.  If you experience loose stools or diarrhea, hold the colace until you stool forms back up.  If your symptoms  do not get better within 1 week or if they get worse, check with your doctor.  Dulcolax (bisacodyl) - Pick up over-the-counter and take as directed by the product packaging as needed to assist with the movement of your bowels.  Take with a full glass of water.  Use this product as needed if not relieved by Colace only.   MiraLax (polyethylene glycol) - Pick up over-the-counter to have on hand.  MiraLax is a solution that will increase the amount of water in your bowels to assist with bowel movements.  Take as directed and can mix with a glass of water, juice, soda, coffee, or tea.  Take if you go more than two days without a movement. Do not use MiraLax more than once per day. Call your doctor if you are still constipated or irregular after using this medication for 7 days in a row.  If you continue to have problems with postoperative constipation, please contact the office for further assistance and recommendations.  If you experience "the worst abdominal pain ever" or develop nausea or vomiting, please contact the office immediatly for further recommendations for treatment.   Do not put a pillow under the knee. Place it under the heel.    Complete by:  As directed    Do not sit on low chairs, stoools or toilet seats, as it may be difficult to get up from low surfaces    Complete by:  As directed    Driving restrictions    Complete by:  As directed    No driving until released by the physician.   Increase activity slowly as tolerated    Complete by:  As directed    Lifting restrictions    Complete by:  As directed    No lifting until released by the physician.   Patient may shower    Complete by:  As directed    You may shower without a dressing once there is no drainage.  Do not wash over the wound.  If  drainage remains, do not shower until drainage stops.   TED hose    Complete by:  As directed    Use stockings (TED hose) for 3 weeks on both leg(s).  You may remove them at night for sleeping.   Weight bearing as tolerated    Complete by:  As directed    Laterality:  right   Extremity:  Lower     Allergies as of 06/21/2017      Reactions   Carisoprodol Itching      Medication List    STOP taking these medications   aspirin EC 81 MG tablet   cyclobenzaprine 10 MG tablet Commonly known as:  FLEXERIL   Fish Oil 1200 MG Caps   multivitamin with minerals Tabs tablet   testosterone cypionate 200 MG/ML injection Commonly known as:  DEPOTESTOSTERONE CYPIONATE     TAKE these medications   amiodarone 200 MG tablet Commonly known as:  PACERONE TAKE 1 TABLET (200 MG TOTAL) BY MOUTH DAILY. What changed:  how much to take  how to take this  when to take this  additional instructions   diazepam 5 MG tablet Commonly known as:  VALIUM Take 1-2 tablets (5-10 mg total) by mouth every 6 (six) hours as needed for muscle spasms.   dutasteride 0.5 MG capsule Commonly known as:  AVODART Take 1 capsule (0.5 mg total) by mouth daily.   esomeprazole 40 MG capsule Commonly known as:  NEXIUM Take 1  capsule (40 mg total) by mouth 2 (two) times daily. What changed:  additional instructions   fexofenadine 180 MG tablet Commonly known as:  ALLEGRA Take 180 mg by mouth daily.   furosemide 40 MG tablet Commonly known as:  LASIX Take 1 tablet (40 mg total) by mouth daily.   gabapentin 800 MG tablet Commonly known as:  NEURONTIN TAKE 1 TABLET 3 TIMES A DAY What changed:  See the new instructions.   glucose blood test strip Commonly known as:  ONE TOUCH ULTRA TEST Check sugar twice daily. Dx- E11.8   hydrochlorothiazide 12.5 MG tablet Commonly known as:  HYDRODIURIL TAKE 2 TABLETS (25 MG TOTAL) BY MOUTH AT BEDTIME.   insulin aspart 100 UNIT/ML FlexPen Commonly known as:   NOVOLOG FLEXPEN INJECT 14 UNITS 3 TIMES A   DAY (MORNING, NOON, & NIGHT)   LANTUS SOLOSTAR 100 UNIT/ML Solostar Pen Generic drug:  Insulin Glargine Inject 30 Units into the skin 2 (two) times daily. What changed:  how much to take   magnesium oxide 400 (241.3 Mg) MG tablet Commonly known as:  MAG-OX TAKE 1 TABLET (400 MG TOTAL) BY MOUTH 2 (TWO) TIMES DAILY.   metFORMIN 1000 MG tablet Commonly known as:  GLUCOPHAGE TAKE 1 TABLET (1,000 MG TOTAL) BY MOUTH 2 (TWO) TIMES DAILY.   metoprolol tartrate 100 MG tablet Commonly known as:  LOPRESSOR TAKE 1 TABLET (100 MG TOTAL) BY MOUTH 2 (TWO) TIMES DAILY.   oxyCODONE 40 mg 12 hr tablet Commonly known as:  OXYCONTIN Take 1 tablet (40 mg total) by mouth every 8 (eight) hours as needed.   Pen Needles 31G X 5 MM Misc 1 each by Does not apply route daily. PATIENT NEEDS NOVA TWIST NEEDLES 5 MM  DX E11.9   pregabalin 50 MG capsule Commonly known as:  LYRICA Take 1 capsule (50 mg total) by mouth 3 (three) times daily.   ranitidine 300 MG tablet Commonly known as:  ZANTAC TAKE 1 TABLET BY MOUTH EVERY DAY   RHINOCORT AQUA 32 MCG/ACT nasal spray Generic drug:  budesonide Place 1 spray into the nose daily as needed (for allergies.).   rivaroxaban 10 MG Tabs tablet Commonly known as:  XARELTO Take 1 tablet (10 mg total) by mouth daily with breakfast. Take Xarelto for two and a half more weeks following discharge from the hospital, then discontinue Xarelto. Once the patient has completed the Xarelto, they may resume the 81 mg Aspirin. Start taking on:  06/22/2017   sitaGLIPtin 100 MG tablet Commonly known as:  JANUVIA Take 1 tablet (100 mg total) by mouth daily.   traMADol 50 MG tablet Commonly known as:  ULTRAM Take 1-2 tablets (50-100 mg total) by mouth every 6 (six) hours as needed for moderate pain.   VICTOZA 18 MG/3ML Sopn Generic drug:  liraglutide INJECT 0.3ML (=1.8MG) INTO THE SKIN DAILY      Follow-up Information     Gaynelle Arabian, MD. Schedule an appointment as soon as possible for a visit on 07/05/2017.   Specialty:  Orthopedic Surgery Contact information: [address] [place] [address] [phone]           Signed: Arlee Muslim, PA-C Orthopaedic Surgery 06/21/2017, 9:55 PM

## 2017-06-21 NOTE — Progress Notes (Signed)
Physical Therapy Treatment Patient Details Name: Jerry Parsons MRN: [id number] DOB: Sep 03, 1951 Today's Date: 06/21/2017    History of Present Illness Pt is a 66 year old male s/p R TKA with hx including but not limited to OSA, DM, DVT/PE    PT Comments    POD # 1 am session Attempted to see earlier approx 40 min after pain meds however pt close to tears still in pain requesting for "something stronger".  Pt stated he has had multiple back surgeries and a long Hx of taking pain meds. Returned later, pt was OOB in recliner via OT with spouse at bedside.  Pt stated his pain was down to a 9/10.  Applied KI and instructed pt and spouse on proper application and use.  Assisted with amb a greater distance in hallway while monitoring vitals. Supine in recliner      BP 118/60, HR 58, RA 95% Initial standing           BP 109/54, HR 64, RA 96% amb 12 feet               BP 117/63, HR 70, RA 98% amb another 15 feet BP 123/56, HR 71, RA 97% No c/o dizziness Tolerated session well.  Returned to room and applied ICE to R knee and heat to R thigh (pt c/o mid thigh tenderness)   Follow Up Recommendations  DC plan and follow up therapy as arranged by surgeon;Outpatient PT     Equipment Recommendations  None recommended by PT    Recommendations for Other Services       Precautions / Restrictions Precautions Precautions: Fall;Knee Precaution Comments: instructed on proper application and use of KI for amb and stairs Required Braces or Orthoses: Knee Immobilizer - Right Knee Immobilizer - Right: Discontinue once straight leg raise with < 10 degree lag Restrictions Weight Bearing Restrictions: No Other Position/Activity Restrictions: WBAT    Mobility  Bed Mobility               General bed mobility comments: OOB in recliner  Transfers Overall transfer level: Needs assistance Equipment used: Rolling walker (2 wheeled) Transfers: Sit to/from [facility] Sit to Stand:  Min assist;+2 safety/equipment Stand pivot transfers: +2 safety/equipment;Min assist       General transfer comment: 25% VC's on proper hand placement and R LE advancement prior to sit  Ambulation/Gait Ambulation/Gait assistance: Min guard;+2 safety/equipment Ambulation Distance (Feet): 35 Feet Assistive device: Rolling walker (2 wheeled) Gait Pattern/deviations: Step-to pattern;Decreased stance time - right;Antalgic Gait velocity: decreased    General Gait Details: spouse assisted by following with recliner for safety due to low BP.  Amb + 2 assist for safety and several standing rest breaks to monitor BP.  Pt required "extra" pain meds to ambulate.     Stairs            Wheelchair Mobility    Modified Rankin (Stroke Patients Only)       Balance                                            Cognition Arousal/Alertness: Awake/alert Behavior During Therapy: WFL for tasks assessed/performed Overall Cognitive Status: Within Functional Limits for tasks assessed  Exercises      General Comments        Pertinent Vitals/Pain Pain Assessment: 0-10 Pain Score: 9  Pain Location: R knee Pain Descriptors / Indicators: Operative site guarding;Tender Pain Intervention(s): Monitored during session;Repositioned;Ice applied    Home Living                      Prior Function            PT Goals (current goals can now be found in the care plan section) Progress towards PT goals: Progressing toward goals    Frequency    7X/week      PT Plan Current plan remains appropriate    Co-evaluation              AM-PAC PT "6 Clicks" Daily Activity  Outcome Measure  Difficulty turning over in bed (including adjusting bedclothes, sheets and blankets)?: Total Difficulty moving from lying on back to sitting on the side of the bed? : Total Difficulty sitting down on and standing up from  a chair with arms (e.g., wheelchair, bedside commode, etc,.)?: Total Help needed moving to and from a bed to chair (including a wheelchair)?: A Lot Help needed walking in hospital room?: A Lot Help needed climbing 3-5 steps with a railing? : A Lot 6 Click Score: 9    End of Session Equipment Utilized During Treatment: Right knee immobilizer;Gait belt Activity Tolerance: Patient tolerated treatment well Patient left: in chair;with call bell/phone within reach;with family/visitor present Nurse Communication: Mobility status PT Visit Diagnosis: Other abnormalities of gait and mobility (R26.89);Pain     Time: 1135-1200 PT Time Calculation (min) (ACUTE ONLY): 25 min  Charges:  $Gait Training: 8-22 mins $Therapeutic Activity: 8-22 mins                    G Codes:       Rica Koyanagi  PTA [facility]  Acute  Rehab Pager      [phone]

## 2017-06-21 NOTE — Progress Notes (Signed)
Subjective: 1 Day Post-Op Procedure(s) (LRB): RIGHT TOTAL KNEE ARTHROPLASTY (Right) Patient reports pain as mild and moderate.   Patient seen in rounds for Dr. Wynelle Link earlier this morning. Patient was well, but has had some minor complaints of pain in the knee, requiring pain medications Started therapy by walkinf 15 feet on the day of surgery and then over 50 this afternoon. Plan is to go Home after hospital stay.  Objective: Vital signs in last 24 hours: Temp:  [97.7 F (36.5 C)-99.5 F (37.5 C)] 98.5 F (36.9 C) (06/26 2034) Pulse Rate:  [61-72] 71 (06/26 2034) Resp:  [17-20] 17 (06/26 2034) BP: (98-137)/(54-67) 137/67 (06/26 2034) SpO2:  [93 %-99 %] 98 % (06/26 2034)  Intake/Output from previous day:  Intake/Output Summary (Last 24 hours) at 06/21/17 2147 Last data filed at 06/21/17 2033  Gross per 24 hour  Intake             3047 ml  Output             3310 ml  Net             -263 ml    Intake/Output this shift: Total I/O In: 100 [P.O.:100] Out: 0   Labs:  Recent Labs  06/21/17 0403  HGB 10.7*    Recent Labs  06/21/17 0403  WBC 11.3*  RBC 4.12*  HCT 33.0*  PLT 120*    Recent Labs  06/21/17 0403  NA 139  K 3.9  CL 104  CO2 29  BUN 18  CREATININE 1.19  GLUCOSE 210*  CALCIUM 8.7*   No results for input(s): LABPT, INR in the last 72 hours.  EXAM General - Patient is Alert, Appropriate and Oriented Extremity - Neurovascular intact Sensation intact distally Intact pulses distally Dorsiflexion/Plantar flexion intact Dressing - dressing C/D/I Motor Function - intact, moving foot and toes well on exam.  Hemovac pulled without difficulty.  Past Medical History:  Diagnosis Date  . Arrhythmia    tachycardia, A-Fib  . Arthritis   . Blind right eye   . BPH (benign prostatic hyperplasia)   . Diabetes mellitus   . DVT (deep venous thrombosis) ([place]) 02/2010   leg thrombus ; dislodged into emboli and caused PE  . Dysrhythmia   . 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 GERD (gastroesophageal reflux disease)   . Hypercholesteremia   . Hypertension   . Kidney failure   . Neuropathy   . Pneumonia    time 9 ;last episode 12/2015  . Pulmonary embolus ([place]) 2011  . Seasonal allergies   . Seizures ([place])    as child   . Sleep apnea    BIPAP  . Stiff neck    limited turning s/p titanium plate placement  . TIA (transient ischemic attack)   . Wears dentures    full upper and lower    Assessment/Plan: 1 Day Post-Op Procedure(s) (LRB): RIGHT TOTAL KNEE ARTHROPLASTY (Right) Active Problems:   OA (osteoarthritis) of knee  Estimated body mass index is 36.82 kg/m as calculated from the following:   Height as of this encounter: 5\' 11"  (1.803 m).   Weight as of this encounter: 119.7 kg (264 lb). Advance diet Up with therapy Plan for discharge tomorrow Discharge home - straight to outpatient therapy  DVT Prophylaxis - Xarelto Weight-Bearing as tolerated to right leg D/C O2 and Pulse OX and try on Room Air  Arlee Muslim, PA-C Orthopaedic Surgery 06/21/2017, 9:47 PM

## 2017-06-21 NOTE — Progress Notes (Signed)
Discharge plan:  Pt planning to due Outpt PT at [facility] and is scheduled for 6/29. Pt has a RW and BSC at home. Marney Doctor RN,BSN,NCM [phone]

## 2017-06-22 LAB — CBC
HCT: 33.2 % — ABNORMAL LOW (ref 39.0–52.0)
Hemoglobin: 10.8 g/dL — ABNORMAL LOW (ref 13.0–17.0)
MCH: 26 pg (ref 26.0–34.0)
MCHC: 32.5 g/dL (ref 30.0–36.0)
MCV: 79.8 fL (ref 78.0–100.0)
Platelets: 121 10*3/uL — ABNORMAL LOW (ref 150–400)
RBC: 4.16 MIL/uL — ABNORMAL LOW (ref 4.22–5.81)
RDW: 16.5 % — ABNORMAL HIGH (ref 11.5–15.5)
WBC: 9.9 10*3/uL (ref 4.0–10.5)

## 2017-06-22 LAB — BASIC METABOLIC PANEL
Anion gap: 7 (ref 5–15)
BUN: 15 mg/dL (ref 6–20)
CO2: 31 mmol/L (ref 22–32)
Calcium: 8.8 mg/dL — ABNORMAL LOW (ref 8.9–10.3)
Chloride: 102 mmol/L (ref 101–111)
Creatinine, Ser: 1.11 mg/dL (ref 0.61–1.24)
GFR calc Af Amer: >60 mL/min (ref >60)
GFR calc non Af Amer: >60 mL/min (ref >60)
Glucose, Bld: 137 mg/dL — ABNORMAL HIGH (ref 65–99)
Potassium: 4.1 mmol/L (ref 3.5–5.1)
Sodium: 140 mmol/L (ref 135–145)

## 2017-06-22 LAB — GLUCOSE, CAPILLARY
Glucose-Capillary: 136 mg/dL — ABNORMAL HIGH (ref 65–99)
Glucose-Capillary: 223 mg/dL — ABNORMAL HIGH (ref 65–99)

## 2017-06-22 MED ORDER — OXYCODONE HCL 5 MG PO TABS: 5.0000 mg | ORAL_TABLET | ORAL | 0 refills | Status: DC | PRN

## 2017-06-22 NOTE — Progress Notes (Signed)
Patient called out after administration of oxycodone and leaving room, stating he cant handle waiting for oxycodone to kick in, he needs morphine IV because the pain is 10 out of 10. Educated patient on oxycodone and how long it takes to work and allow it to take time, but unable to calm patient in regards to pain control.

## 2017-06-22 NOTE — Progress Notes (Signed)
Physical Therapy Treatment Patient Details Name: Jerry Parsons MRN: [id number] DOB: July 01, 1951 Today's Date: 06/22/2017    History of Present Illness Pt is a 66 year old male s/p R TKA with hx including but not limited to OSA, DM, DVT/PE    PT Comments    POD # 2 pm session Had spouse apply KI with min direction.  Assisted with amb and practiced one step backward wearing KI which pt was able to perform safely.  Pt instructed on proper walker to self distance and safety with turns.  Spouse purchased a leg lifter, so instructed on proper use.   Pt and spouse feel comfortable with D/C today and has met mobility goals.   Follow Up Recommendations  DC plan and follow up therapy as arranged by surgeon;Outpatient PT     Equipment Recommendations  None recommended by PT    Recommendations for Other Services       Precautions / Restrictions Precautions Precautions: Fall;Knee Precaution Comments: instructed on proper application and use of KI for amb and stairs Required Braces or Orthoses: Knee Immobilizer - Right Knee Immobilizer - Right: Discontinue once straight leg raise with < 10 degree lag Restrictions Weight Bearing Restrictions: No Other Position/Activity Restrictions: WBAT    Mobility  Bed Mobility         Supine to sit: Min assist;HOB elevated     General bed mobility comments: OOB in recliner  Transfers Overall transfer level: Needs assistance Equipment used: Rolling walker (2 wheeled) Transfers: Sit to/from [facility] Sit to Stand: Supervision;Min guard Stand pivot transfers: Supervision;Min guard       General transfer comment: instructed spouse "hands on" safe handling  Ambulation/Gait Ambulation/Gait assistance: Supervision;Min guard Ambulation Distance (Feet): 20 Feet Assistive device: Rolling walker (2 wheeled) Gait Pattern/deviations: Step-to pattern;Decreased stance time - right;Antalgic Gait velocity: decreased   General Gait  Details: spouse "hands on" assisted with amb in hallway plus 50% VC's to pt to advance walker out more and avoid "picking it up" to increase safety and steadiness.    Stairs Stairs: Yes   Stair Management: Backwards Number of Stairs: 1 General stair comments: attempted up backward this time which pt was better able to perform.    Wheelchair Mobility    Modified Rankin (Stroke Patients Only)       Balance                                            Cognition Arousal/Alertness: Awake/alert Behavior During Therapy: WFL for tasks assessed/performed Overall Cognitive Status: Within Functional Limits for tasks assessed                                        Exercises      General Comments        Pertinent Vitals/Pain Pain Assessment: 0-10 Pain Score: 8  Pain Location: R knee Pain Descriptors / Indicators: Operative site guarding;Tender Pain Intervention(s): Monitored during session;Repositioned;Ice applied    Home Living                      Prior Function            PT Goals (current goals can now be found in the care plan section) Acute Rehab PT Goals Patient Stated  Goal: less pain    Frequency    7X/week      PT Plan Current plan remains appropriate    Co-evaluation              AM-PAC PT "6 Clicks" Daily Activity  Outcome Measure  Difficulty turning over in bed (including adjusting bedclothes, sheets and blankets)?: Total Difficulty moving from lying on back to sitting on the side of the bed? : Total Difficulty sitting down on and standing up from a chair with arms (e.g., wheelchair, bedside commode, etc,.)?: Total Help needed moving to and from a bed to chair (including a wheelchair)?: A Lot Help needed walking in hospital room?: A Lot Help needed climbing 3-5 steps with a railing? : A Lot 6 Click Score: 9    End of Session Equipment Utilized During Treatment: Right knee immobilizer;Gait  belt Activity Tolerance: Patient limited by pain Patient left: in chair;with call bell/phone within reach;with family/visitor present Nurse Communication: Mobility status       Time: 1410-1435 PT Time Calculation (min) (ACUTE ONLY): 25 min  Charges:  $Gait Training: 8-22 mins $Therapeutic Activity: 8-22 mins                    G Codes:       Rica Koyanagi  PTA [facility]  Acute  Rehab Pager      [phone]

## 2017-06-22 NOTE — Progress Notes (Signed)
Occupational Therapy Treatment Patient Details Name: Jerry Parsons MRN: [id number] DOB: 1951-02-22 Today's Date: 06/22/2017    History of present illness Pt is a 66 year old male s/p R TKA with hx including but not limited to OSA, DM, DVT/PE   OT comments  Wife donning KI today; pt used leg lifter; performed transfer to 3:1 commode.  Pt needs cues for safety and wife feels comfortable assisting him  Follow Up Recommendations  Supervision/Assistance - 24 hour    Equipment Recommendations  None recommended by OT    Recommendations for Other Services      Precautions / Restrictions Precautions Precautions: Fall;Knee Required Braces or Orthoses: Knee Immobilizer - Right Restrictions Weight Bearing Restrictions: No       Mobility Bed Mobility         Supine to sit: Min assist;HOB elevated     General bed mobility comments: assist for trunk  Transfers   Equipment used: Rolling walker (2 wheeled)   Sit to Stand: Min guard;Min assist         General transfer comment: min A from bed; min guard from 3:1 commode    Balance                                           ADL either performed or assessed with clinical judgement   ADL                           Toilet Transfer: Minimal assistance;RW;Stand-pivot             General ADL Comments: ambulated to bathroom with min guard.  Min A to stand from bed; min guard from 3:1. Cues for safety. Wife applied KI.  Introduced leg lifter for pt to assist as leg is heavy and painful.  He used this when in the chair to have [place] removed  Pt is not ready for shower transfer due to pain.  Gave him and wife a handout and demonstrated sequence for when he is ready. He will sponge bathe initially     Vision       Perception     Praxis      Cognition Arousal/Alertness: Awake/alert Behavior During Therapy: WFL for tasks assessed/performed Overall Cognitive Status: Within Functional Limits for  tasks assessed                                          Exercises     Shoulder Instructions       General Comments      Pertinent Vitals/ Pain       Pain Assessment: Faces Pain Score: 10-Worst pain ever Pain Location: R knee Pain Descriptors / Indicators: Operative site guarding;Tender Pain Intervention(s): Limited activity within patient's tolerance;Monitored during session;Premedicated before session;Repositioned;Heat applied;Ice applied (heat for thigh)  Home Living                                          Prior Functioning/Environment              Frequency           Progress Toward Goals  OT Goals(current  goals can now be found in the care plan section)  Progress towards OT goals: Progressing toward goals  Acute Rehab OT Goals Patient Stated Goal: less pain  Plan      Co-evaluation                 AM-PAC PT "6 Clicks" Daily Activity     Outcome Measure   Help from another person eating meals?: None Help from another person taking care of personal grooming?: A Little Help from another person toileting, which includes using toliet, bedpan, or urinal?: A Lot Help from another person bathing (including washing, rinsing, drying)?: A Lot Help from another person to put on and taking off regular upper body clothing?: A Little Help from another person to put on and taking off regular lower body clothing?: A Lot 6 Click Score: 16    End of Session    OT Visit Diagnosis: Pain Pain - Right/Left: Right Pain - part of body: Knee   Activity Tolerance Patient limited by pain   Patient Left in chair;with call bell/phone within reach;with family/visitor present   Nurse Communication          Time: 1829-9371 OT Time Calculation (min): 26 min  Charges: OT General Charges $OT Visit: 1 Procedure OT Treatments $Self Care/Home Management : 23-37 mins Lesle Chris,  OTR/L [phone] 06/22/2017   [place] 06/22/2017, 11:53 AM

## 2017-06-22 NOTE — Progress Notes (Signed)
Physical Therapy Treatment Patient Details Name: Jerry Parsons MRN: [id number] DOB: 1951/06/08 Today's Date: 06/22/2017    History of Present Illness Pt is a 66 year old male s/p R TKA with hx including but not limited to OSA, DM, DVT/PE    PT Comments    POD # 2 am session Attempted practicing one step forward with spouse when pt had a near fall due to 10/10 R knee pain when he stepped up with his left.  Pt was wearing his KI for increased support.  Recliner brought to patient.  Pt will need another session prior to D/C to home.   Follow Up Recommendations  DC plan and follow up therapy as arranged by surgeon;Outpatient PT     Equipment Recommendations  None recommended by PT    Recommendations for Other Services       Precautions / Restrictions Precautions Precautions: Fall;Knee Precaution Comments: instructed on proper application and use of KI for amb and stairs Required Braces or Orthoses: Knee Immobilizer - Right Knee Immobilizer - Right: Discontinue once straight leg raise with < 10 degree lag Restrictions Weight Bearing Restrictions: No Other Position/Activity Restrictions: WBAT    Mobility  Bed Mobility         Supine to sit: Min assist;HOB elevated     General bed mobility comments: OOB in recliner  Transfers Overall transfer level: Needs assistance Equipment used: Rolling walker (2 wheeled) Transfers: Sit to/from [facility] Sit to Stand: Supervision;Min guard Stand pivot transfers: Supervision;Min guard       General transfer comment: instructed spouse "hands on" safe handling  Ambulation/Gait Ambulation/Gait assistance: Supervision;Min guard Ambulation Distance (Feet): 20 Feet Assistive device: Rolling walker (2 wheeled) Gait Pattern/deviations: Step-to pattern;Decreased stance time - right;Antalgic Gait velocity: decreased   General Gait Details: spouse "hands on" assisted with amb in hallway plus 50% VC's to pt to advance  walker out more and avoid "picking it up" to increase safety and steadiness.    Stairs Stairs: Yes   Stair Management: No rails;Step to pattern;Forwards Number of Stairs: 1 General stair comments: with spouse "hands on" instructed on proper tech attempted forward approach however unable to complete with near fall and 10/10 R knee pain.  Recliner brought to pt.    Wheelchair Mobility    Modified Rankin (Stroke Patients Only)       Balance                                            Cognition Arousal/Alertness: Awake/alert Behavior During Therapy: WFL for tasks assessed/performed Overall Cognitive Status: Within Functional Limits for tasks assessed                                        Exercises      General Comments        Pertinent Vitals/Pain Pain Assessment: 0-10 Pain Score: 8  Pain Location: R knee Pain Descriptors / Indicators: Operative site guarding;Tender Pain Intervention(s): Monitored during session;Repositioned;Ice applied    Home Living                      Prior Function            PT Goals (current goals can now be found in the care plan section)  Acute Rehab PT Goals Patient Stated Goal: less pain    Frequency    7X/week      PT Plan Current plan remains appropriate    Co-evaluation              AM-PAC PT "6 Clicks" Daily Activity  Outcome Measure  Difficulty turning over in bed (including adjusting bedclothes, sheets and blankets)?: Total Difficulty moving from lying on back to sitting on the side of the bed? : Total Difficulty sitting down on and standing up from a chair with arms (e.g., wheelchair, bedside commode, etc,.)?: Total Help needed moving to and from a bed to chair (including a wheelchair)?: A Lot Help needed walking in hospital room?: A Lot Help needed climbing 3-5 steps with a railing? : A Lot 6 Click Score: 9    End of Session Equipment Utilized During Treatment:  Right knee immobilizer;Gait belt Activity Tolerance: Patient limited by pain Patient left: in chair;with call bell/phone within reach;with family/visitor present Nurse Communication: Mobility status       Time: 6967-8938 PT Time Calculation (min) (ACUTE ONLY): 30 min  Charges:  $Gait Training: 8-22 mins $Therapeutic Activity: 8-22 mins                    G Codes:       Rica Koyanagi  PTA [facility]  Acute  Rehab Pager      [phone]

## 2017-06-23 NOTE — Progress Notes (Signed)
LATE ENTRY NOTE Date of Service of Visit - 06/22/2017  Subjective: 2 Days Post-Op Procedure(s) (LRB): RIGHT TOTAL KNEE ARTHROPLASTY (Right) Patient reports pain as mild.   Patient seen in rounds for Dr. Wynelle Link. Patient is well, and has had no acute complaints or problems Patient is ready to go home  Objective: Vital signs in last 24 hours: Temp:  [99.2 F (37.3 C)] 99.2 F (37.3 C) (06/27 1350) Pulse Rate:  [78] 78 (06/27 1350) Resp:  [15] 15 (06/27 1350) BP: (135)/(62) 135/62 (06/27 1350) SpO2:  [95 %] 95 % (06/27 1350)  Intake/Output from previous day:  Intake/Output Summary (Last 24 hours) at 06/23/17 0923 Last data filed at 06/22/17 1414  Gross per 24 hour  Intake              120 ml  Output             1710 ml  Net            -1590 ml    Intake/Output this shift: Total I/O In: 1733.3 [P.O.:1150; I.V.:333.3; IV Piggyback:250] Out: 8338 [Urine:3685]  Labs:  Recent Labs  06/21/17 0403 06/22/17 0417  HGB 10.7* 10.8*    Recent Labs  06/21/17 0403 06/22/17 0417  WBC 11.3* 9.9  RBC 4.12* 4.16*  HCT 33.0* 33.2*  PLT 120* 121*    Recent Labs  06/21/17 0403 06/22/17 0417  NA 139 140  K 3.9 4.1  CL 104 102  CO2 29 31  BUN 18 15  CREATININE 1.19 1.11  GLUCOSE 210* 137*  CALCIUM 8.7* 8.8*   No results for input(s): LABPT, INR in the last 72 hours.  EXAM: General - Patient is Alert, Appropriate and Oriented Extremity - Neurovascular intact Sensation intact distally Intact pulses distally Dorsiflexion/Plantar flexion intact Incision - clean, dry Motor Function - intact, moving foot and toes well on exam.   Assessment/Plan: 3 Days Post-Op Procedure(s) (LRB): RIGHT TOTAL KNEE ARTHROPLASTY (Right) Procedure(s) (LRB): RIGHT TOTAL KNEE ARTHROPLASTY (Right) Past Medical History:  Diagnosis Date  . Arrhythmia    tachycardia, A-Fib  . Arthritis   . Blind right eye   . BPH (benign prostatic hyperplasia)   . Diabetes mellitus   . DVT (deep  venous thrombosis) ([place]) 02/2010   leg thrombus ; dislodged into emboli and caused PE  . Dysrhythmia   . 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 GERD (gastroesophageal reflux disease)   . Hypercholesteremia   . Hypertension   . Kidney failure   . Neuropathy   . Pneumonia    time 9 ;last episode 12/2015  . Pulmonary embolus ([place]) 2011  . Seasonal allergies   . Seizures ([place])    as child   . Sleep apnea    BIPAP  . Stiff neck    limited turning s/p titanium plate placement  . TIA (transient ischemic attack)   . Wears dentures    full upper and lower   Active Problems:   OA (osteoarthritis) of knee  Estimated body mass index is 36.82 kg/m as calculated from the following:   Height as of this encounter: 5\' 11"  (1.803 m).   Weight as of this encounter: 119.7 kg (264 lb). Up with therapy Diet - Cardiac diet and Renal diet Follow up - in 2 weeks Activity - WBAT Disposition - Home Condition Upon Discharge - Good D/C Meds - See DC Summary DVT Prophylaxis - Xarelto  Arlee Muslim, PA-C Orthopaedic Surgery 06/23/2017, 9:23  AM

## 2017-06-24 DIAGNOSIS — M1711 Unilateral primary osteoarthritis, right knee: Secondary | ICD-10-CM | POA: Diagnosis not present

## 2017-06-27 ENCOUNTER — Telehealth: Payer: Self-pay

## 2017-06-27 DIAGNOSIS — M1711 Unilateral primary osteoarthritis, right knee: Secondary | ICD-10-CM | POA: Diagnosis not present

## 2017-06-27 NOTE — Telephone Encounter (Signed)
Patient's wife beverly, called and states that mail order still has RX for Oxycontin 40 mg on hold due to RX says 07/2017. I spoke to a representative last week (June) and this was suppose to have been fixed. I called back to [phone] and spoke to representative again and advised to release as of today, provided DX. They said everything is fixed and is in process to be filled. They had no other questions. Beverly advised, I apologized for the issue with this.-aa

## 2017-07-01 ENCOUNTER — Other Ambulatory Visit: Payer: Self-pay | Admitting: Family Medicine

## 2017-07-01 DIAGNOSIS — G629 Polyneuropathy, unspecified: Secondary | ICD-10-CM

## 2017-07-01 NOTE — Telephone Encounter (Signed)
[facility] faxed refill request for 90 day supply of the following medications: pregabalin (LYRICA) 50 MG capsule Last written: 01/20/17 LOV: 06/16/17 Please advise. Thanks TNP

## 2017-07-04 DIAGNOSIS — M1711 Unilateral primary osteoarthritis, right knee: Secondary | ICD-10-CM | POA: Diagnosis not present

## 2017-07-04 MED ORDER — PREGABALIN 50 MG PO CAPS: 50.0000 mg | ORAL_CAPSULE | Freq: Three times a day (TID) | ORAL | 1 refills | Status: DC

## 2017-07-05 DIAGNOSIS — Z471 Aftercare following joint replacement surgery: Secondary | ICD-10-CM | POA: Diagnosis not present

## 2017-07-05 DIAGNOSIS — Z96651 Presence of right artificial knee joint: Secondary | ICD-10-CM | POA: Diagnosis not present

## 2017-07-07 DIAGNOSIS — M1711 Unilateral primary osteoarthritis, right knee: Secondary | ICD-10-CM | POA: Diagnosis not present

## 2017-07-11 DIAGNOSIS — M1711 Unilateral primary osteoarthritis, right knee: Secondary | ICD-10-CM | POA: Diagnosis not present

## 2017-07-13 ENCOUNTER — Encounter: Payer: Self-pay | Admitting: Family Medicine

## 2017-07-13 ENCOUNTER — Ambulatory Visit
Admission: RE | Admit: 2017-07-13 | Discharge: 2017-07-13 | Disposition: A | Discharge status: Home / Self Care | Payer: Medicare Other | Source: Ambulatory Visit | Attending: Family Medicine | Admitting: Family Medicine

## 2017-07-13 ENCOUNTER — Other Ambulatory Visit: Payer: Self-pay | Admitting: Family Medicine

## 2017-07-13 ENCOUNTER — Ambulatory Visit (INDEPENDENT_AMBULATORY_CARE_PROVIDER_SITE_OTHER): Payer: Medicare Other | Admitting: Family Medicine

## 2017-07-13 VITALS — BP 128/56 | HR 82 | Temp 98.9°F | Resp 16 | Wt 261.0 lb

## 2017-07-13 DIAGNOSIS — R059 Cough, unspecified: Secondary | ICD-10-CM

## 2017-07-13 DIAGNOSIS — R05 Cough: Secondary | ICD-10-CM | POA: Diagnosis not present

## 2017-07-13 DIAGNOSIS — J40 Bronchitis, not specified as acute or chronic: Secondary | ICD-10-CM

## 2017-07-13 DIAGNOSIS — R350 Frequency of micturition: Secondary | ICD-10-CM

## 2017-07-13 DIAGNOSIS — R6883 Chills (without fever): Secondary | ICD-10-CM

## 2017-07-13 DIAGNOSIS — R509 Fever, unspecified: Secondary | ICD-10-CM | POA: Diagnosis not present

## 2017-07-13 LAB — POCT URINALYSIS DIPSTICK
Bilirubin, UA: NEGATIVE
Blood, UA: NEGATIVE
Glucose, UA: NEGATIVE
Ketones, UA: NEGATIVE
Leukocytes, UA: NEGATIVE
Nitrite, UA: NEGATIVE
Spec Grav, UA: 1.01 (ref 1.010–1.025)
Urobilinogen, UA: 0.2 E.U./dL
pH, UA: 7.5 (ref 5.0–8.0)

## 2017-07-13 MED ORDER — DOXYCYCLINE HYCLATE 100 MG PO TABS: 100.0000 mg | ORAL_TABLET | Freq: Two times a day (BID) | ORAL | 0 refills | Status: DC

## 2017-07-13 NOTE — Progress Notes (Signed)
Subjective:  HPI Pt is here today for chills. He woke up with chills this morning about 3:00 and could not get warm wife checked his tempeture and it was 99.5. She gave him some tylenol. He reports that he started feeling bad yesterday and has a "rattley" cough, but no sputum production. His Ox is down a little today at 91-92. He has a total knee replacement a little over 3 weeks ago. Denies shortness of breath, or chest pain. He does have some increased urinary frequency.    Prior to Admission medications   Medication Sig Start Date End Date Taking? Authorizing Provider  amiodarone (PACERONE) 200 MG tablet TAKE 1 TABLET (200 MG TOTAL) BY MOUTH DAILY. Patient taking differently: Take 400 mg by mouth daily. TAKE 1 TABLET (200 MG TOTAL) BY MOUTH DAILY. 05/10/17   Vaughan Basta, MD  budesonide (RHINOCORT AQUA) 32 MCG/ACT nasal spray Place 1 spray into the nose daily as needed (for allergies.).     [provider]  diazepam (VALIUM) 5 MG tablet Take 1-2 tablets (5-10 mg total) by mouth every 6 (six) hours as needed for muscle spasms. 06/21/17   Perkins, Alexzandrew L, PA-C  dutasteride (AVODART) 0.5 MG capsule Take 1 capsule (0.5 mg total) by mouth daily. 08/04/16   Jerrol Banana., MD  esomeprazole (NEXIUM) 40 MG capsule Take 1 capsule (40 mg total) by mouth 2 (two) times daily. Patient taking differently: Take 40 mg by mouth 2 (two) times daily. Morning & Night 01/20/17   Jerrol Banana., MD  fexofenadine (ALLEGRA) 180 MG tablet Take 180 mg by mouth daily.      [provider]  furosemide (LASIX) 40 MG tablet Take 1 tablet (40 mg total) by mouth daily. 05/10/17   Vaughan Basta, MD  gabapentin (NEURONTIN) 800 MG tablet TAKE 1 TABLET 3 TIMES A DAY Patient taking differently: TAKE 2 TABLET (1600 MG) 2 TIMES A DAY 11/19/16   Jerrol Banana., MD  glucose blood (ONE TOUCH ULTRA TEST) test strip Check sugar twice daily. Dx- E11.8 05/09/17   Jerrol Banana., MD  hydrochlorothiazide (HYDRODIURIL) 12.5 MG tablet TAKE 2 TABLETS (25 MG TOTAL) BY MOUTH AT BEDTIME. 03/25/17   Jerrol Banana., MD  insulin aspart (NOVOLOG FLEXPEN) 100 UNIT/ML FlexPen INJECT 14 UNITS 3 TIMES A   DAY (MORNING, NOON, & NIGHT) 06/17/17   Carmon Ginsberg, PA  Insulin Pen Needle (PEN NEEDLES) 31G X 5 MM MISC 1 each by Does not apply route daily. PATIENT NEEDS NOVA TWIST NEEDLES 5 MM  DX E11.9 10/18/16   Jerrol Banana., MD  LANTUS SOLOSTAR 100 UNIT/ML Solostar Pen Inject 30 Units into the skin 2 (two) times daily. Patient taking differently: Inject 35 Units into the skin 2 (two) times daily.  06/09/17   Jerrol Banana., MD  magnesium oxide (MAG-OX) 400 (241.3 Mg) MG tablet TAKE 1 TABLET (400 MG TOTAL) BY MOUTH 2 (TWO) TIMES DAILY. 01/27/17   Jerrol Banana., MD  metFORMIN (GLUCOPHAGE) 1000 MG tablet TAKE 1 TABLET (1,000 MG TOTAL) BY MOUTH 2 (TWO) TIMES DAILY. 11/24/15   [provider]  metFORMIN (GLUCOPHAGE) 1000 MG tablet TAKE 1 TABLET (1,000 MG TOTAL) BY MOUTH 2 (TWO) TIMES DAILY. 07/13/17   Jerrol Banana., MD  metoprolol (LOPRESSOR) 100 MG tablet TAKE 1 TABLET (100 MG TOTAL) BY MOUTH 2 (TWO) TIMES DAILY. 02/21/17   Jerrol Banana., MD  oxyCODONE (OXY  IR/ROXICODONE) 5 MG immediate release tablet Take 1-4 tablets (5-20 mg total) by mouth every 4 (four) hours as needed for moderate pain or severe pain. 06/22/17   Perkins, Alexzandrew L, PA-C  oxyCODONE (OXYCONTIN) 40 mg 12 hr tablet Take 1 tablet (40 mg total) by mouth every 8 (eight) hours as needed. 06/09/17   Jerrol Banana., MD  pregabalin (LYRICA) 50 MG capsule Take 1 capsule (50 mg total) by mouth 3 (three) times daily. 07/04/17   Jerrol Banana., MD  ranitidine (ZANTAC) 300 MG tablet TAKE 1 TABLET BY MOUTH EVERY DAY 02/28/17   Jerrol Banana., MD  rivaroxaban (XARELTO) 10 MG TABS tablet Take 1 tablet (10 mg total) by mouth daily with breakfast. Take  Xarelto for two and a half more weeks following discharge from the hospital, then discontinue Xarelto. Once the patient has completed the Xarelto, they may resume the 81 mg Aspirin. 06/22/17   Perkins, Alexzandrew L, PA-C  sitaGLIPtin (JANUVIA) 100 MG tablet Take 1 tablet (100 mg total) by mouth daily. 01/20/17   Jerrol Banana., MD  traMADol (ULTRAM) 50 MG tablet Take 1-2 tablets (50-100 mg total) by mouth every 6 (six) hours as needed for moderate pain. 06/21/17   Perkins, Alexzandrew L, PA-C  VICTOZA 18 MG/3ML SOPN INJECT 0.3ML (=1.8MG ) INTO THE SKIN DAILY 12/21/16   Jerrol Banana., MD    Patient Active Problem List   Diagnosis Date Noted  . OA (osteoarthritis) of knee 06/20/2017  . Sepsis ([place]) 05/08/2017  . Sepsis due to pneumonia ([place]) 05/07/2017  . Neuropathy 10/13/2016  . Injury of kidney 01/21/2016  . Amblyopia 12/30/2015  . 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 NS (nuclear sclerosis) 12/30/2015  . Pseudoaphakia 12/30/2015  . CVA (cerebral infarction) 09/08/2015  . Dehydration 09/08/2015  . Aspiration pneumonia ([place]) 07/04/2015  . Paroxysmal atrial fibrillation ([place]) 07/04/2015  . Arthritis of knee, degenerative 05/28/2015  . Chronic back pain 09/17/2014  . Leg edema 05/07/2014  . Chronic diastolic CHF (congestive heart failure) ([place]) 05/07/2014  . Campylobacter diarrhea 04/25/2013  . Atrial flutter ([place]) 01/23/2013  . Obesity 05/19/2012  . Hyperlipidemia 11/11/2011  . Diastolic dysfunction 46/56/8127  . SOB (shortness of breath) 04/12/2011  . Diabetes mellitus type 2, uncontrolled, with complications ([place]) 51/70/0174  . HYPERTENSION, BENIGN 03/15/2011  . DVT 03/15/2011  . TACHYCARDIA 03/15/2011    Past Medical History:  Diagnosis Date  . Arrhythmia    tachycardia, A-Fib  . Arthritis   . Blind right eye   . BPH (benign prostatic hyperplasia)   . Diabetes mellitus   . DVT (deep venous thrombosis) ([place]) 02/2010   leg thrombus ; dislodged into emboli and caused PE   . Dysrhythmia   . 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 GERD (gastroesophageal reflux disease)   . Hypercholesteremia   . Hypertension   . Kidney failure   . Neuropathy   . Pneumonia    time 9 ;last episode 12/2015  . Pulmonary embolus ([place]) 2011  . Seasonal allergies   . Seizures ([place])    as child   . Sleep apnea    BIPAP  . Stiff neck    limited turning s/p titanium plate placement  . TIA (transient ischemic attack)   . Wears dentures    full upper and lower    Social History   Social History  . Marital status: Married    Spouse name: N/A  . Number  of children: N/A  . Years of education: N/A   Occupational History  . Not on file.   Social History Main Topics  . Smoking status: Former Smoker    Packs/day: 2.00    Years: 20.00    Types: Cigarettes    Quit date: 12/26/1989  . Smokeless tobacco: Never Used  . Alcohol use No  . Drug use: No  . Sexual activity: Not on file   Other Topics Concern  . Not on file   Social History Narrative  . No narrative on file    Allergies  Allergen Reactions  . Carisoprodol Itching    Review of Systems  Constitutional: Positive for chills, diaphoresis, fever and malaise/fatigue.  HENT: Negative.   Eyes: Negative.   Respiratory: Positive for cough.   Cardiovascular: Negative.   Gastrointestinal: Positive for diarrhea.  Genitourinary: Positive for frequency.  Musculoskeletal: Positive for back pain and joint pain.  Skin: Negative.   Neurological: Positive for dizziness.  Endo/Heme/Allergies: Negative.   Psychiatric/Behavioral: Negative.     Immunization History  Administered Date(s) Administered  . Influenza, High Dose Seasonal PF 09/18/2015, 10/13/2016  . Influenza-Unspecified 08/27/2014  . Pneumococcal Conjugate-13 07/10/2014  . Pneumococcal Polysaccharide-23 12/02/1999, 12/30/2011  . Td 03/31/2005  . Zoster 06/30/2014    Objective:  BP (!) 128/56 (BP Location: Left Arm, Patient Position:  Sitting, Cuff Size: Large)   Pulse 82   Temp 98.9 F (37.2 C) (Oral)   Resp 16   Wt 261 lb (118.4 kg)   SpO2 92%   BMI 36.40 kg/m   Physical Exam  Constitutional: He is oriented to person, place, and time and well-developed, well-nourished, and in no distress.  HENT:  Head: Normocephalic and atraumatic.  Right Ear: External ear normal.  Left Ear: External ear normal.  Nose: Nose normal.  Mouth/Throat: Oropharynx is clear and moist.  Eyes: Pupils are equal, round, and reactive to light. Conjunctivae and EOM are normal.  Neck: Normal range of motion. Neck supple.  Cardiovascular: Normal rate, regular rhythm, normal heart sounds and intact distal pulses.   Pulmonary/Chest: Effort normal and breath sounds normal.  Musculoskeletal: Normal range of motion.  Neurological: He is alert and oriented to person, place, and time. Gait normal. GCS score is 15.  Skin: Skin is warm and dry.  Psychiatric: Mood, memory, affect and judgment normal.    Lab Results  Component Value Date   WBC 9.9 06/22/2017   HGB 10.8 (L) 06/22/2017   HCT 33.2 (L) 06/22/2017   PLT 121 (L) 06/22/2017   GLUCOSE 137 (H) 06/22/2017   CHOL 89 (L) 12/11/2015   TRIG 298 (H) 12/11/2015   HDL 26 (L) 12/11/2015   LDLCALC 3 12/11/2015   TSH 2.21 04/03/2013   INR 0.97 06/16/2017   HGBA1C 5.9 06/09/2017    CMP     Component Value Date/Time   NA 140 06/22/2017 0417   NA 142 12/11/2015 1014   NA 137 04/12/2013 1150   K 4.1 06/22/2017 0417   K 4.0 04/12/2013 1150   CL 102 06/22/2017 0417   CL 104 04/12/2013 1150   CO2 31 06/22/2017 0417   CO2 30 04/12/2013 1150   GLUCOSE 137 (H) 06/22/2017 0417   GLUCOSE 146 (H) 04/12/2013 1150   BUN 15 06/22/2017 0417   BUN 24 12/11/2015 1014   BUN 11 04/12/2013 1150   CREATININE 1.11 06/22/2017 0417   CREATININE 0.98 04/12/2013 1150   CALCIUM 8.8 (L) 06/22/2017 0417   CALCIUM 8.8 04/12/2013 1150  PROT 7.4 06/16/2017 1500   PROT 6.5 12/11/2015 1014   PROT 6.9  04/12/2013 1150   ALBUMIN 4.2 06/16/2017 1500   ALBUMIN 4.3 12/11/2015 1014   ALBUMIN 3.3 (L) 04/12/2013 1150   AST 31 06/16/2017 1500   AST 22 04/12/2013 1150   ALT 38 06/16/2017 1500   ALT 29 04/12/2013 1150   ALKPHOS 89 06/16/2017 1500   ALKPHOS 99 04/12/2013 1150   BILITOT 0.5 06/16/2017 1500   BILITOT 0.5 12/11/2015 1014   BILITOT 0.5 04/12/2013 1150   GFRNONAA >60 06/22/2017 0417   GFRNONAA >60 04/12/2013 1150   GFRAA >60 06/22/2017 0417   GFRAA >60 04/12/2013 1150    Assessment and Plan :  1. Fever, unspecified fever cause Cover for pneumonia. - POCT urinalysis dipstick  2. Chills  3. Urinary frequency  - POCT urinalysis dipstick  4. Bronchitis  - doxycycline (VIBRA-TABS) 100 MG tablet; Take 1 tablet (100 mg total) by mouth 2 (two) times daily.  Dispense: 14 tablet; Refill: 0 - DG Chest 2 View; Future  5. Cough  - DG Chest 2 View; Future 6.Recent TKR No findings of DVT.  HPI, Exam, and A&P Transcribed under the direction and in the presence of Richard L. Cranford Mon, MD  Electronically Signed: Katina Dung, CMA I have done the exam and reviewed the above chart and it is accurate to the best of my knowledge. Development worker, community has been used in this note in any air is in the dictation or transcription are unintentional.  [facility] 07/13/2017 11:36 AM

## 2017-07-13 NOTE — Patient Instructions (Addendum)
Lots of fluids and tylenol for the fever. Go across the street for chest xray. Call if you get worse or develop new symptoms. Check wound from knee replacement to make sure does not get red or no swelling in legs.

## 2017-07-14 DIAGNOSIS — M1711 Unilateral primary osteoarthritis, right knee: Secondary | ICD-10-CM | POA: Diagnosis not present

## 2017-07-18 ENCOUNTER — Other Ambulatory Visit: Payer: Self-pay

## 2017-07-18 DIAGNOSIS — M1711 Unilateral primary osteoarthritis, right knee: Secondary | ICD-10-CM | POA: Diagnosis not present

## 2017-07-21 DIAGNOSIS — M1711 Unilateral primary osteoarthritis, right knee: Secondary | ICD-10-CM | POA: Diagnosis not present

## 2017-07-26 DIAGNOSIS — Z471 Aftercare following joint replacement surgery: Secondary | ICD-10-CM | POA: Diagnosis not present

## 2017-07-26 DIAGNOSIS — M1711 Unilateral primary osteoarthritis, right knee: Secondary | ICD-10-CM | POA: Diagnosis not present

## 2017-07-26 DIAGNOSIS — Z96651 Presence of right artificial knee joint: Secondary | ICD-10-CM | POA: Diagnosis not present

## 2017-08-02 DIAGNOSIS — M1711 Unilateral primary osteoarthritis, right knee: Secondary | ICD-10-CM | POA: Diagnosis not present

## 2017-08-17 ENCOUNTER — Other Ambulatory Visit: Payer: Self-pay | Admitting: Family Medicine

## 2017-08-17 DIAGNOSIS — G629 Polyneuropathy, unspecified: Secondary | ICD-10-CM

## 2017-08-17 DIAGNOSIS — E291 Testicular hypofunction: Secondary | ICD-10-CM | POA: Diagnosis not present

## 2017-08-17 MED ORDER — GABAPENTIN 800 MG PO TABS: 800.0000 mg | ORAL_TABLET | Freq: Three times a day (TID) | ORAL | 3 refills | Status: DC

## 2017-08-17 MED ORDER — DUTASTERIDE 0.5 MG PO CAPS: 0.5000 mg | ORAL_CAPSULE | Freq: Every day | ORAL | 3 refills | Status: DC

## 2017-08-17 NOTE — Telephone Encounter (Signed)
Pt contacted office for refill request on the following medications:  dutasteride (AVODART) 0.5 MG capsule   gabapentin (NEURONTIN) 800 MG tablet  [facility].  SU#[phone]/FE

## 2017-08-19 DIAGNOSIS — H43812 Vitreous degeneration, left eye: Secondary | ICD-10-CM | POA: Diagnosis not present

## 2017-08-19 LAB — HM DIABETES EYE EXAM

## 2017-08-26 ENCOUNTER — Encounter: Payer: Self-pay | Admitting: Family Medicine

## 2017-08-30 ENCOUNTER — Other Ambulatory Visit: Payer: Self-pay | Admitting: Family Medicine

## 2017-08-31 ENCOUNTER — Ambulatory Visit (INDEPENDENT_AMBULATORY_CARE_PROVIDER_SITE_OTHER): Payer: Medicare Other

## 2017-08-31 VITALS — BP 142/50 | HR 76 | Temp 98.7°F | Ht 71.0 in | Wt 278.2 lb

## 2017-08-31 DIAGNOSIS — Z0000 Encounter for general adult medical examination without abnormal findings: Secondary | ICD-10-CM | POA: Diagnosis not present

## 2017-08-31 DIAGNOSIS — Z23 Encounter for immunization: Secondary | ICD-10-CM

## 2017-08-31 NOTE — Patient Instructions (Addendum)
[name] , Thank you for taking time to come for your Medicare Wellness Visit. I appreciate your ongoing commitment to your health goals. Please review the following plan we discussed and let me know if I can assist you in the future.   Screening recommendations/referrals: Colonoscopy: up to date Recommended yearly ophthalmology/optometry visit for glaucoma screening and checkup Recommended yearly dental visit for hygiene and checkup  Vaccinations: Influenza vaccine: declined Pneumococcal vaccine: Prevnar 13 completed today Tdap vaccine: completed today Shingles vaccine: completed 06/30/14  Advanced directives: Please bring a copy of your POA (Power of [place]) and/or Living Will to your next appointment.   Conditions/risks identified: Obesity; Recommend cutting out extra servings at night time.   Next appointment: 09/14/17  Preventive Care 65 Years and Older, Male Preventive care refers to lifestyle choices and visits with your health care provider that can promote health and wellness. What does preventive care include?  A yearly physical exam. This is also called an annual well check.  Dental exams once or twice a year.  Routine eye exams. Ask your health care provider how often you should have your eyes checked.  Personal lifestyle choices, including:  Daily care of your teeth and gums.  Regular physical activity.  Eating a healthy diet.  Avoiding tobacco and drug use.  Limiting alcohol use.  Practicing safe sex.  Taking low doses of aspirin every day.  Taking vitamin and mineral supplements as recommended by your health care provider. What happens during an annual well check? The services and screenings done by your health care provider during your annual well check will depend on your age, overall health, lifestyle risk factors, and family history of disease. Counseling  Your health care provider may ask you questions about your:  Alcohol use.  Tobacco  use.  Drug use.  Emotional well-being.  Home and relationship well-being.  Sexual activity.  Eating habits.  History of falls.  Memory and ability to understand (cognition).  Work and work Statistician. Screening  You may have the following tests or measurements:  Height, weight, and BMI.  Blood pressure.  Lipid and cholesterol levels. These may be checked every 5 years, or more frequently if you are over 61 years old.  Skin check.  Lung cancer screening. You may have this screening every year starting at age 62 if you have a 30-pack-year history of smoking and currently smoke or have quit within the past 15 years.  Fecal occult blood test (FOBT) of the stool. You may have this test every year starting at age 67.  Flexible sigmoidoscopy or colonoscopy. You may have a sigmoidoscopy every 5 years or a colonoscopy every 10 years starting at age 47.  Prostate cancer screening. Recommendations will vary depending on your family history and other risks.  Hepatitis C blood test.  Hepatitis B blood test.  Sexually transmitted disease (STD) testing.  Diabetes screening. This is done by checking your blood sugar (glucose) after you have not eaten for a while (fasting). You may have this done every 1-3 years.  Abdominal aortic aneurysm (AAA) screening. You may need this if you are a current or former smoker.  Osteoporosis. You may be screened starting at age 38 if you are at high risk. Talk with your health care provider about your test results, treatment options, and if necessary, the need for more tests. Vaccines  Your health care provider may recommend certain vaccines, such as:  Influenza vaccine. This is recommended every year.  Tetanus, diphtheria, and acellular  pertussis (Tdap, Td) vaccine. You may need a Td booster every 10 years.  Zoster vaccine. You may need this after age 60.  Pneumococcal 13-valent conjugate (PCV13) vaccine. One dose is recommended after age  58.  Pneumococcal polysaccharide (PPSV23) vaccine. One dose is recommended after age 57. Talk to your health care provider about which screenings and vaccines you need and how often you need them. This information is not intended to replace advice given to you by your health care provider. Make sure you discuss any questions you have with your health care provider. Document Released: 01/09/2016 Document Revised: 09/01/2016 Document Reviewed: 10/14/2015 Elsevier Interactive Patient Education  2017 [place] Prevention in the Home Falls can cause injuries. They can happen to people of all ages. There are many things you can do to make your home safe and to help prevent falls. What can I do on the outside of my home?  Regularly fix the edges of walkways and driveways and fix any cracks.  Remove anything that might make you trip as you walk through a door, such as a raised step or threshold.  Trim any bushes or trees on the path to your home.  Use bright outdoor lighting.  Clear any walking paths of anything that might make someone trip, such as rocks or tools.  Regularly check to see if handrails are loose or broken. Make sure that both sides of any steps have handrails.  Any raised decks and porches should have guardrails on the edges.  Have any leaves, snow, or ice cleared regularly.  Use sand or salt on walking paths during winter.  Clean up any spills in your garage right away. This includes oil or grease spills. What can I do in the bathroom?  Use night lights.  Install grab bars by the toilet and in the tub and shower. Do not use towel bars as grab bars.  Use non-skid mats or decals in the tub or shower.  If you need to sit down in the shower, use a plastic, non-slip stool.  Keep the floor dry. Clean up any water that spills on the floor as soon as it happens.  Remove soap buildup in the tub or shower regularly.  Attach bath mats securely with double-sided  non-slip rug tape.  Do not have throw rugs and other things on the floor that can make you trip. What can I do in the bedroom?  Use night lights.  Make sure that you have a light by your bed that is easy to reach.  Do not use any sheets or blankets that are too big for your bed. They should not hang down onto the floor.  Have a firm chair that has side arms. You can use this for support while you get dressed.  Do not have throw rugs and other things on the floor that can make you trip. What can I do in the kitchen?  Clean up any spills right away.  Avoid walking on wet floors.  Keep items that you use a lot in easy-to-reach places.  If you need to reach something above you, use a strong step stool that has a grab bar.  Keep electrical cords out of the way.  Do not use floor polish or wax that makes floors slippery. If you must use wax, use non-skid floor wax.  Do not have throw rugs and other things on the floor that can make you trip. What can I do with my stairs?  Do not leave any items on the stairs.  Make sure that there are handrails on both sides of the stairs and use them. Fix handrails that are broken or loose. Make sure that handrails are as long as the stairways.  Check any carpeting to make sure that it is firmly attached to the stairs. Fix any carpet that is loose or worn.  Avoid having throw rugs at the top or bottom of the stairs. If you do have throw rugs, attach them to the floor with carpet tape.  Make sure that you have a light switch at the top of the stairs and the bottom of the stairs. If you do not have them, ask someone to add them for you. What else can I do to help prevent falls?  Wear shoes that:  Do not have high heels.  Have rubber bottoms.  Are comfortable and fit you well.  Are closed at the toe. Do not wear sandals.  If you use a stepladder:  Make sure that it is fully opened. Do not climb a closed stepladder.  Make sure that both  sides of the stepladder are locked into place.  Ask someone to hold it for you, if possible.  Clearly mark and make sure that you can see:  Any grab bars or handrails.  First and last steps.  Where the edge of each step is.  Use tools that help you move around (mobility aids) if they are needed. These include:  Canes.  Walkers.  Scooters.  Crutches.  Turn on the lights when you go into a dark area. Replace any light bulbs as soon as they burn out.  Set up your furniture so you have a clear path. Avoid moving your furniture around.  If any of your floors are uneven, fix them.  If there are any pets around you, be aware of where they are.  Review your medicines with your doctor. Some medicines can make you feel dizzy. This can increase your chance of falling. Ask your doctor what other things that you can do to help prevent falls. This information is not intended to replace advice given to you by your health care provider. Make sure you discuss any questions you have with your health care provider. Document Released: 10/09/2009 Document Revised: 05/20/2016 Document Reviewed: 01/17/2015 Elsevier Interactive Patient Education  2017 [employer].

## 2017-08-31 NOTE — Progress Notes (Signed)
Subjective:   Jerry Parsons is a 66 y.o. male who presents for an Initial Medicare Annual Wellness Visit.  Review of Systems  N/A  Cardiac Risk Factors include: advanced age (>59men, >31 women);diabetes mellitus;dyslipidemia;hypertension;male gender;obesity (BMI >30kg/m2)    Objective:    Today's Vitals   08/31/17 1258 08/31/17 1304  BP: (!) 142/50   Pulse: 76   Temp: 98.7 F (37.1 C)   TempSrc: Oral   Weight: 278 lb 3.2 oz (126.2 kg)   Height: 5\' 11"  (1.803 m)   PainSc: 0-No pain 0-No pain   Body mass index is 38.8 kg/m.  Current Medications (verified) Outpatient Encounter Prescriptions as of 08/31/2017  Medication Sig  . amiodarone (PACERONE) 200 MG tablet TAKE 1 TABLET (200 MG TOTAL) BY MOUTH DAILY. (Patient taking differently: Take 200 mg by mouth daily. TAKE 1 TABLET (200 MG TOTAL) BY MOUTH DAILY.)  . aspirin 81 MG tablet Take 81 mg by mouth daily.  . budesonide (RHINOCORT AQUA) 32 MCG/ACT nasal spray Place 1 spray into the nose daily as needed (for allergies.).   [name] cyclobenzaprine (FLEXERIL) 10 MG tablet Take 10 mg by mouth 3 (three) times daily as needed for muscle spasms.  [name] dutasteride (AVODART) 0.5 MG capsule Take 1 capsule (0.5 mg total) by mouth daily.  [name] esomeprazole (NEXIUM) 40 MG capsule Take 1 capsule (40 mg total) by mouth 2 (two) times daily. (Patient taking differently: Take 40 mg by mouth 2 (two) times daily. Morning & Night)  . fexofenadine (ALLEGRA) 180 MG tablet Take 180 mg by mouth daily.    . furosemide (LASIX) 40 MG tablet Take 1 tablet (40 mg total) by mouth daily.  [name] gabapentin (NEURONTIN) 800 MG tablet Take 1 tablet (800 mg total) by mouth 3 (three) times daily. (Patient taking differently: Take 1,600 mg by mouth 2 (two) times daily. )  . glucose blood (ONE TOUCH ULTRA TEST) test strip Check sugar twice daily. Dx- E11.8  . hydrochlorothiazide (HYDRODIURIL) 12.5 MG tablet TAKE 2 TABLETS (25 MG TOTAL) BY MOUTH AT BEDTIME.  . insulin aspart (NOVOLOG  FLEXPEN) 100 UNIT/ML FlexPen INJECT 14 UNITS 3 TIMES A   DAY (MORNING, NOON, & NIGHT)  . Insulin Pen Needle (PEN NEEDLES) 31G X 5 MM MISC 1 each by Does not apply route daily. PATIENT NEEDS NOVA TWIST NEEDLES 5 MM  DX E11.9  . LANTUS SOLOSTAR 100 UNIT/ML Solostar Pen Inject 30 Units into the skin 2 (two) times daily. (Patient taking differently: Inject 35 Units into the skin 2 (two) times daily. )  . magnesium oxide (MAG-OX) 400 (241.3 Mg) MG tablet TAKE 1 TABLET (400 MG TOTAL) BY MOUTH 2 (TWO) TIMES DAILY.  . metFORMIN (GLUCOPHAGE) 1000 MG tablet TAKE 1 TABLET (1,000 MG TOTAL) BY MOUTH 2 (TWO) TIMES DAILY.  . metoprolol (LOPRESSOR) 100 MG tablet TAKE 1 TABLET (100 MG TOTAL) BY MOUTH 2 (TWO) TIMES DAILY.  . mometasone (ELOCON) 0.1 % cream APPLY 1 APPLICATION TOPICALLY DAILY. FOR 2 WEEKS (Patient taking differently: Apply 1 application topically daily. For 2 weeks)  . Multiple Vitamins-Minerals (CENTRUM SILVER 50+MEN PO) Take by mouth.  . Omega-3 Fatty Acids (FISH OIL) 1200 MG CAPS Take 1,200 mg by mouth daily.  [name] oxyCODONE (OXYCONTIN) 40 mg 12 hr tablet Take 1 tablet (40 mg total) by mouth every 8 (eight) hours as needed.  . pregabalin (LYRICA) 50 MG capsule Take 1 capsule (50 mg total) by mouth 3 (three) times daily.  . ranitidine (ZANTAC) 300 MG tablet  TAKE 1 TABLET BY MOUTH EVERY DAY  . sitaGLIPtin (JANUVIA) 100 MG tablet Take 1 tablet (100 mg total) by mouth daily.  . Testosterone Cypionate (DEPO-TESTOSTERONE IM) Inject into the muscle every 21 ( twenty-one) days.  [name] VICTOZA 18 MG/3ML SOPN INJECT 0.3ML (=1.8MG ) INTO THE SKIN DAILY  . [DISCONTINUED] diazepam (VALIUM) 5 MG tablet Take 1-2 tablets (5-10 mg total) by mouth every 6 (six) hours as needed for muscle spasms.  . [DISCONTINUED] doxycycline (VIBRA-TABS) 100 MG tablet Take 1 tablet (100 mg total) by mouth 2 (two) times daily.  . [DISCONTINUED] metFORMIN (GLUCOPHAGE) 1000 MG tablet TAKE 1 TABLET (1,000 MG TOTAL) BY MOUTH 2 (TWO) TIMES DAILY.   . [DISCONTINUED] oxyCODONE (OXY IR/ROXICODONE) 5 MG immediate release tablet Take 1-4 tablets (5-20 mg total) by mouth every 4 (four) hours as needed for moderate pain or severe pain.  . [DISCONTINUED] rivaroxaban (XARELTO) 10 MG TABS tablet Take 1 tablet (10 mg total) by mouth daily with breakfast. Take Xarelto for two and a half more weeks following discharge from the hospital, then discontinue Xarelto. Once the patient has completed the Xarelto, they may resume the 81 mg Aspirin.  . [DISCONTINUED] traMADol (ULTRAM) 50 MG tablet Take 1-2 tablets (50-100 mg total) by mouth every 6 (six) hours as needed for moderate pain.   No facility-administered encounter medications on file as of 08/31/2017.     Allergies (verified) Carisoprodol   History: Past Medical History:  Diagnosis Date  . Arrhythmia    tachycardia, A-Fib  . Arthritis   . Blind right eye   . BPH (benign prostatic hyperplasia)   . Diabetes mellitus   . DVT (deep venous thrombosis) ([place]) 02/2010   leg thrombus ; dislodged into emboli and caused PE  . Dysrhythmia   . 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 GERD (gastroesophageal reflux disease)   . Hypercholesteremia   . Hypertension   . Kidney failure   . Neuropathy   . Pneumonia    time 9 ;last episode 12/2015  . Pulmonary embolus ([place]) 2011  . Seasonal allergies   . Seizures ([place])    as child   . Sleep apnea    BIPAP  . Stiff neck    limited turning s/p titanium plate placement  . TIA (transient ischemic attack)   . Wears dentures    full upper and lower   Past Surgical History:  Procedure Laterality Date  . APPENDECTOMY    . BACK SURGERY     x 8; upper x 3 & lower x 5  . CARDIOVERSION  03/14/13, 10/16   2014 - [place], 2016 - [place]  . CATARACT EXTRACTION W/PHACO Left 10/29/2015   Procedure: CATARACT EXTRACTION PHACO AND INTRAOCULAR LENS PLACEMENT (IOC);  Surgeon: Leandrew Koyanagi, MD;  Location: [facility];  Service: Ophthalmology;   Laterality: Left;  DIABETIC - insulin and oral medsSleep apnea - no machine  . CHOLECYSTECTOMY    . EYE SURGERY    . GALLBLADDER SURGERY  2002  . KNEE ARTHROSCOPY     left   . ROTATOR CUFF REPAIR  2001   left   . TONSILLECTOMY    . 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 Heart attack Brother    Social History   Occupational History  .  Not on file.   Social History Main Topics  . Smoking status: Former Smoker    Packs/day: 2.00    Years: 20.00    Types: Cigarettes    Quit date: 12/26/1989  . Smokeless tobacco: Never Used  . Alcohol use No  . Drug use: No  . Sexual activity: Not on file   Tobacco Counseling Counseling given: Not Answered   Activities of Daily Living In your present state of health, do you have any difficulty performing the following activities: 08/31/2017 06/20/2017  Hearing? Y N  Comment needs wax built up removed from ears -  Vision? N Y  Comment - -  Difficulty concentrating or making decisions? Y N  Walking or climbing stairs? Y N  Comment post knee replacement sx -  Dressing or bathing? N N  Doing errands, shopping? N N  Preparing Food and eating ? N -  Using the Toilet? N -  In the past six months, have you accidently leaked urine? N -  Do you have problems with loss of bowel control? N -  Managing your Medications? N -  Managing your Finances? N -  Housekeeping or managing your Housekeeping? N -  Some recent data might be hidden    Immunizations and Health Maintenance Immunization History  Administered Date(s) Administered  . Influenza, High Dose Seasonal PF 09/18/2015, 10/13/2016  . Influenza-Unspecified 08/27/2014  . Pneumococcal Conjugate-13 07/10/2014, 08/31/2017  . Pneumococcal Polysaccharide-23 12/02/1999, 12/30/2011  . Td 03/31/2005  . Tdap  08/31/2017  . Zoster 06/30/2014   Health Maintenance Due  Topic Date Due  . Hepatitis C Screening  March 11, 1951  . URINE MICROALBUMIN  01/19/1960  . FOOT EXAM  03/02/2017  . INFLUENZA VACCINE  07/27/2017    Patient Care Team: Jerrol Banana., MD as PCP - General (Unknown Physician Specialty) Minna Merritts, MD as Consulting Physician (Cardiology) Leandrew Koyanagi, MD as Referring Physician (Ophthalmology) Glenna Fellows, MD as Attending Physician (Neurosurgery) Gaynelle Arabian, MD as Consulting Physician (Orthopedic Surgery)  Indicate any recent Medical Services you may have received from other than [facility] providers in the past year (date may be approximate).    Assessment:   This is a routine wellness examination for [place].   Hearing/Vision screen Vision Screening Comments: Pt sees Dr Wallace Going for vision checks yearly.   Dietary issues and exercise activities discussed: Current Exercise Habits: Home exercise routine, Type of exercise: walking;stretching (leg stretches post knee sx), Time (Minutes): 20, Frequency (Times/Week): 4, Weekly Exercise (Minutes/Week): 80, Intensity: Mild, Exercise limited by: orthopedic condition(s)  Goals    . Cut out extra servings          Recommend cutting out extra servings at night time.       Depression Screen PHQ 2/9 Scores 08/31/2017 01/12/2016 08/21/2015  PHQ - 2 Score 1 0 0    Fall Risk Fall Risk  08/31/2017 01/12/2016  Falls in the past year? Yes Yes  Number falls in past yr: 2 or more 1  Injury with Fall? Yes No  Comment knee gave out, pt has had total knee replacement sx and is doing much better -  Risk Factor Category  High Fall Risk -  Risk for fall due to : Impaired mobility -    Cognitive Function: Pt declined screening today.        Screening Tests Health Maintenance  Topic Date Due  . Hepatitis C Screening  May 11, 1951  . URINE MICROALBUMIN  01/19/1960  . FOOT EXAM  03/02/2017  .  INFLUENZA VACCINE  07/27/2017  .  HEMOGLOBIN A1C  12/09/2017  . OPHTHALMOLOGY EXAM  08/19/2018  . COLONOSCOPY  12/03/2018  . TETANUS/TDAP  09/01/2027  . PNA vac Low Risk Adult  Completed        Plan:  I have personally reviewed and addressed the Medicare Annual Wellness questionnaire and have noted the following in the patient's chart:  A. Medical and social history B. Use of alcohol, tobacco or illicit drugs  C. Current medications and supplements D. Functional ability and status E.  Nutritional status F.  Physical activity G. Advance directives H. List of other physicians I.  Hospitalizations, surgeries, and ER visits in previous 12 months J.  [place] such as hearing and vision if needed, cognitive and depression L. Referrals and appointments - none  In addition, I have reviewed and discussed with patient certain preventive protocols, quality metrics, and best practice recommendations. A written personalized care plan for preventive services as well as general preventive health recommendations were provided to patient.  See attached scanned questionnaire for additional information.   Signed,  Fabio Neighbors, LPN Nurse Health Advisor   MD Recommendations: Pt declined Hepatitis C screening

## 2017-09-12 ENCOUNTER — Other Ambulatory Visit: Payer: Self-pay | Admitting: Family Medicine

## 2017-09-13 DIAGNOSIS — M1711 Unilateral primary osteoarthritis, right knee: Secondary | ICD-10-CM | POA: Diagnosis not present

## 2017-09-14 ENCOUNTER — Ambulatory Visit (INDEPENDENT_AMBULATORY_CARE_PROVIDER_SITE_OTHER): Payer: Medicare Other | Admitting: Family Medicine

## 2017-09-14 VITALS — BP 118/64 | HR 64 | Temp 98.1°F | Resp 16 | Wt 273.8 lb

## 2017-09-14 DIAGNOSIS — Z794 Long term (current) use of insulin: Secondary | ICD-10-CM

## 2017-09-14 DIAGNOSIS — E782 Mixed hyperlipidemia: Secondary | ICD-10-CM

## 2017-09-14 DIAGNOSIS — G629 Polyneuropathy, unspecified: Secondary | ICD-10-CM

## 2017-09-14 DIAGNOSIS — Z96651 Presence of right artificial knee joint: Secondary | ICD-10-CM

## 2017-09-14 DIAGNOSIS — E119 Type 2 diabetes mellitus without complications: Secondary | ICD-10-CM | POA: Diagnosis not present

## 2017-09-14 DIAGNOSIS — I10 Essential (primary) hypertension: Secondary | ICD-10-CM | POA: Diagnosis not present

## 2017-09-14 DIAGNOSIS — G4733 Obstructive sleep apnea (adult) (pediatric): Secondary | ICD-10-CM | POA: Diagnosis not present

## 2017-09-14 DIAGNOSIS — M549 Dorsalgia, unspecified: Secondary | ICD-10-CM | POA: Diagnosis not present

## 2017-09-14 DIAGNOSIS — G8929 Other chronic pain: Secondary | ICD-10-CM

## 2017-09-14 LAB — POCT UA - MICROALBUMIN: Microalbumin Ur, POC: 20 mg/L

## 2017-09-14 LAB — POCT GLYCOSYLATED HEMOGLOBIN (HGB A1C): Hemoglobin A1C: 6.5

## 2017-09-14 MED ORDER — ATORVASTATIN CALCIUM 40 MG PO TABS: 40.0000 mg | ORAL_TABLET | Freq: Every day | ORAL | 0 refills | Status: DC

## 2017-09-14 MED ORDER — OXYCODONE HCL ER 40 MG PO T12A: 40.0000 mg | EXTENDED_RELEASE_TABLET | Freq: Three times a day (TID) | ORAL | 0 refills | Status: DC | PRN

## 2017-09-14 MED ORDER — PREGABALIN 100 MG PO CAPS: ORAL_CAPSULE | ORAL | 5 refills | Status: DC

## 2017-09-14 NOTE — Progress Notes (Signed)
Jerry Parsons  MRN: [id number] DOB: [birth year]  Subjective:  HPI  Patient is here for routine follow up.  Last routine office visit was on 06/09/17. DM: patient states his sugar readings have been running high lately around 200, usually reading is around 130-150. No hypoglycemic episodes. Lab Results  Component Value Date   HGBA1C 5.9 06/09/2017   Wt Readings from Last 3 Encounters:  09/14/17 273 lb 12.8 oz (124.2 kg)  08/31/17 278 lb 3.2 oz (126.2 kg)  07/13/17 261 lb (118.4 kg)    Patient Active Problem List   Diagnosis Date Noted  . OA (osteoarthritis) of knee 06/20/2017  . Sepsis ([place]) 05/08/2017  . Sepsis due to pneumonia ([place]) 05/07/2017  . Neuropathy 10/13/2016  . Injury of kidney 01/21/2016  . Amblyopia 12/30/2015  . 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 NS (nuclear sclerosis) 12/30/2015  . Pseudoaphakia 12/30/2015  . CVA (cerebral infarction) 09/08/2015  . Dehydration 09/08/2015  . Aspiration pneumonia ([place]) 07/04/2015  . Paroxysmal atrial fibrillation ([place]) 07/04/2015  . Arthritis of knee, degenerative 05/28/2015  . Chronic back pain 09/17/2014  . Leg edema 05/07/2014  . Chronic diastolic CHF (congestive heart failure) ([place]) 05/07/2014  . Campylobacter diarrhea 04/25/2013  . Atrial flutter ([place]) 01/23/2013  . Obesity 05/19/2012  . Hyperlipidemia 11/11/2011  . Diastolic dysfunction 06/21/9484  . SOB (shortness of breath) 04/12/2011  . Diabetes mellitus type 2, uncontrolled, with complications ([place]) 46/27/0350  . HYPERTENSION, BENIGN 03/15/2011  . DVT 03/15/2011  . TACHYCARDIA 03/15/2011    Past Medical History:  Diagnosis Date  . Arrhythmia    tachycardia, A-Fib  . Arthritis   . Blind right eye   . BPH (benign prostatic hyperplasia)   . Diabetes mellitus   . DVT (deep venous thrombosis) ([place]) 02/2010   leg thrombus ; dislodged into emboli and caused PE  . Dysrhythmia   . 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 GERD (gastroesophageal reflux  disease)   . Hypercholesteremia   . Hypertension   . Kidney failure   . Neuropathy   . Pneumonia    time 9 ;last episode 12/2015  . Pulmonary embolus ([place]) 2011  . Seasonal allergies   . Seizures ([place])    as child   . Sleep apnea    BIPAP  . Stiff neck    limited turning s/p titanium plate placement  . TIA (transient ischemic attack)   . Wears dentures    full upper and lower    Social History   Social History  . Marital status: Married    Spouse name: N/A  . Number of children: N/A  . Years of education: N/A   Occupational History  . Not on file.   Social History Main Topics  . Smoking status: Former Smoker    Packs/day: 2.00    Years: 20.00    Types: Cigarettes    Quit date: 12/26/1989  . Smokeless tobacco: Never Used  . Alcohol use No  . Drug use: No  . Sexual activity: Not on file   Other Topics Concern  . Not on file   Social History Narrative  . No narrative on file    Outpatient Encounter Prescriptions as of 09/14/2017  Medication Sig  . amiodarone (PACERONE) 200 MG tablet TAKE 1 TABLET (200 MG TOTAL) BY MOUTH DAILY. (Patient taking differently: Take 200 mg by mouth daily. TAKE 1 TABLET (200 MG TOTAL) BY MOUTH DAILY.)  . aspirin 81 MG tablet Take 81  mg by mouth daily.  . budesonide (RHINOCORT AQUA) 32 MCG/ACT nasal spray Place 1 spray into the nose daily as needed (for allergies.).   [name] cyclobenzaprine (FLEXERIL) 10 MG tablet Take 10 mg by mouth 3 (three) times daily as needed for muscle spasms.  [name] dutasteride (AVODART) 0.5 MG capsule Take 1 capsule (0.5 mg total) by mouth daily.  [name] esomeprazole (NEXIUM) 40 MG capsule Take 1 capsule (40 mg total) by mouth 2 (two) times daily. (Patient taking differently: Take 40 mg by mouth 2 (two) times daily. Morning & Night)  . fexofenadine (ALLEGRA) 180 MG tablet Take 180 mg by mouth daily.    . furosemide (LASIX) 40 MG tablet Take 1 tablet (40 mg total) by mouth daily.  [name] gabapentin (NEURONTIN) 800 MG tablet Take 1  tablet (800 mg total) by mouth 3 (three) times daily. (Patient taking differently: Take 1,600 mg by mouth 2 (two) times daily. )  . glucose blood (ONE TOUCH ULTRA TEST) test strip Check sugar twice daily. Dx- E11.8  . hydrochlorothiazide (HYDRODIURIL) 12.5 MG tablet TAKE 2 TABLETS (25 MG TOTAL) BY MOUTH AT BEDTIME.  . insulin aspart (NOVOLOG FLEXPEN) 100 UNIT/ML FlexPen INJECT 14 UNITS 3 TIMES A   DAY (MORNING, NOON, & NIGHT)  . Insulin Pen Needle (PEN NEEDLES) 31G X 5 MM MISC 1 each by Does not apply route daily. PATIENT NEEDS NOVA TWIST NEEDLES 5 MM  DX E11.9  . LANTUS SOLOSTAR 100 UNIT/ML Solostar Pen Inject 30 Units into the skin 2 (two) times daily. (Patient taking differently: Inject 35 Units into the skin 2 (two) times daily. )  . magnesium oxide (MAG-OX) 400 (241.3 Mg) MG tablet TAKE 1 TABLET (400 MG TOTAL) BY MOUTH 2 (TWO) TIMES DAILY.  . metFORMIN (GLUCOPHAGE) 1000 MG tablet TAKE 1 TABLET (1,000 MG TOTAL) BY MOUTH 2 (TWO) TIMES DAILY.  . metoprolol (LOPRESSOR) 100 MG tablet TAKE 1 TABLET (100 MG TOTAL) BY MOUTH 2 (TWO) TIMES DAILY.  . mometasone (ELOCON) 0.1 % cream APPLY 1 APPLICATION TOPICALLY DAILY. FOR 2 WEEKS (Patient taking differently: Apply 1 application topically daily. For 2 weeks)  . Multiple Vitamins-Minerals (CENTRUM SILVER 50+MEN PO) Take by mouth.  . Omega-3 Fatty Acids (FISH OIL) 1200 MG CAPS Take 1,200 mg by mouth daily.  [name] oxyCODONE (OXYCONTIN) 40 mg 12 hr tablet Take 1 tablet (40 mg total) by mouth every 8 (eight) hours as needed.  . pregabalin (LYRICA) 50 MG capsule Take 1 capsule (50 mg total) by mouth 3 (three) times daily.  . ranitidine (ZANTAC) 300 MG tablet TAKE 1 TABLET BY MOUTH EVERY DAY  . sitaGLIPtin (JANUVIA) 100 MG tablet Take 1 tablet (100 mg total) by mouth daily.  . Testosterone Cypionate (DEPO-TESTOSTERONE IM) Inject into the muscle every 21 ( twenty-one) days.  [name] VICTOZA 18 MG/3ML SOPN INJECT 0.3ML (=1.8MG ) INTO THE SKIN DAILY   No  facility-administered encounter medications on file as of 09/14/2017.     Allergies  Allergen Reactions  . Carisoprodol Itching    Review of Systems  Constitutional: Positive for malaise/fatigue.  HENT: Negative.   Eyes: Negative.   Respiratory: Negative.   Cardiovascular: Negative.   Gastrointestinal: Negative.   Musculoskeletal: Positive for back pain and joint pain.       Knee swelling  Skin: Negative.   Endo/Heme/Allergies: Negative.   Psychiatric/Behavioral: Negative.     Objective:  BP 118/64   Pulse 64   Temp 98.1 F (36.7 C)   Resp 16   Wt 273  lb 12.8 oz (124.2 kg)   BMI 38.19 kg/m   Physical Exam  Constitutional: He is oriented to person, place, and time and well-developed, well-nourished, and in no distress.  HENT:  Head: Normocephalic and atraumatic.  Right Ear: External ear normal.  Left Ear: External ear normal.  Nose: Nose normal.  Eyes: Pupils are equal, round, and reactive to light. Conjunctivae are normal.  Cardiovascular: Normal rate, regular rhythm, normal heart sounds and intact distal pulses.  Exam reveals no gallop.   No murmur heard. Pulmonary/Chest: Effort normal and breath sounds normal. No respiratory distress. He has no wheezes.  Abdominal: Soft.  Neurological: He is alert and oriented to person, place, and time. GCS score is 15.  Skin: Skin is warm and dry.  Psychiatric: Mood, memory, affect and judgment normal.   Diabetic Foot Exam - Simple   Simple Foot Form Diabetic Foot exam was performed with the following findings:  Yes 09/14/2017  2:06 PM  Visual Inspection No deformities, no ulcerations, no other skin breakdown bilaterally:  Yes Sensation Testing See comments:  Yes Pulse Check Posterior Tibialis and Dorsalis pulse intact bilaterally:  Yes Comments Left distal foot decreased sensation    Assessment and Plan :  1. Controlled type 2 diabetes mellitus without complication, with long-term current use of insulin (HCC) 6.5  today. Worse but stable at this time. Continue current medication regimen. Will follow. Wok on habits. - POCT HgB A1C - POCT UA - Microalbumin  2. Chronic back pain, unspecified back location, unspecified back pain laterality Pain worse. Stop Gabapentin and increase Lyrica instead.  - pregabalin (LYRICA) 100 MG capsule; Take 1 tablet in the morning and 2 tablets in the evening  Dispense: 90 capsule; Refill: 5 Refill x 3 Oxycodone. 3. HYPERTENSION, BENIGN Stable.  4. OSA (obstructive sleep apnea) Using CPAP machine every night. Tolerating well.  5. Neuropathy Pain worse. Stop Gabapentin and increase Lyrica instead.  - pregabalin (LYRICA) 100 MG capsule; Take 1 tablet in the morning and 2 tablets in the evening  Dispense: 90 capsule; Refill: 5  6. S/P total knee arthroplasty, right Healing well. Following up with specialist. 7.HLD Restart Atorvastatin 40 mg.  HPI, Exam and A&P transcribed by Tiffany Kocher, RMA under direction and in the presence of Miguel Aschoff, MD. I have done the exam and reviewed the chart and it is accurate to the best of my knowledge. Development worker, community has been used and  any errors in dictation or transcription are unintentional. Miguel Aschoff M.D. [facility]

## 2017-09-14 NOTE — Patient Instructions (Addendum)
Stop Gabapentin and increase Lyrica dose. Re start Atorvastatin.

## 2017-09-15 ENCOUNTER — Other Ambulatory Visit: Payer: Self-pay | Admitting: Family Medicine

## 2017-09-15 DIAGNOSIS — H43812 Vitreous degeneration, left eye: Secondary | ICD-10-CM | POA: Diagnosis not present

## 2017-09-19 ENCOUNTER — Other Ambulatory Visit: Payer: Self-pay | Admitting: Family Medicine

## 2017-09-29 ENCOUNTER — Other Ambulatory Visit: Payer: Self-pay | Admitting: Family Medicine

## 2017-09-29 MED ORDER — INSULIN ASPART 100 UNIT/ML FLEXPEN: PEN_INJECTOR | SUBCUTANEOUS | 3 refills | Status: DC

## 2017-09-29 NOTE — Telephone Encounter (Signed)
Pt contacted office for refill request on the following medications:  insulin aspart (NOVOLOG FLEXPEN) 100 UNIT/ML FlexPen  [facility] mail order.  XJ#[phone]

## 2017-09-29 NOTE — Telephone Encounter (Signed)
Done-Redith Drach V Geovani Tootle, RMA  

## 2017-10-13 DIAGNOSIS — E291 Testicular hypofunction: Secondary | ICD-10-CM | POA: Diagnosis not present

## 2017-10-17 DIAGNOSIS — M4716 Other spondylosis with myelopathy, lumbar region: Secondary | ICD-10-CM | POA: Diagnosis not present

## 2017-10-17 DIAGNOSIS — Z6837 Body mass index (BMI) 37.0-37.9, adult: Secondary | ICD-10-CM | POA: Diagnosis not present

## 2017-10-22 ENCOUNTER — Other Ambulatory Visit: Payer: Self-pay | Admitting: Family Medicine

## 2017-10-24 ENCOUNTER — Ambulatory Visit: Payer: Medicare Other | Admitting: Family Medicine

## 2017-10-24 DIAGNOSIS — Z86010 Personal history of colonic polyps: Secondary | ICD-10-CM | POA: Diagnosis not present

## 2017-10-26 DIAGNOSIS — D2339 Other benign neoplasm of skin of other parts of face: Secondary | ICD-10-CM | POA: Diagnosis not present

## 2017-10-26 DIAGNOSIS — L570 Actinic keratosis: Secondary | ICD-10-CM | POA: Diagnosis not present

## 2017-10-26 DIAGNOSIS — X32XXXA Exposure to sunlight, initial encounter: Secondary | ICD-10-CM | POA: Diagnosis not present

## 2017-10-26 DIAGNOSIS — L821 Other seborrheic keratosis: Secondary | ICD-10-CM | POA: Diagnosis not present

## 2017-11-01 ENCOUNTER — Telehealth: Payer: Self-pay | Admitting: Family Medicine

## 2017-11-01 NOTE — Telephone Encounter (Signed)
Pt wife accidentally canceled the Rx VICTOZA 18 MG/3ML SOPN on the [place] website and is asking if this can be resent to [employer] order.  ZH#[phone]/[id number]

## 2017-11-02 MED ORDER — LIRAGLUTIDE 18 MG/3ML ~~LOC~~ SOPN: PEN_INJECTOR | SUBCUTANEOUS | 3 refills | Status: DC

## 2017-11-02 NOTE — Telephone Encounter (Signed)
Patient's wife advised RX sent in-Anastasiya Estell Harpin, RMA

## 2017-11-03 ENCOUNTER — Other Ambulatory Visit (HOSPITAL_COMMUNITY): Payer: Self-pay | Admitting: Neurosurgery

## 2017-11-03 ENCOUNTER — Ambulatory Visit (INDEPENDENT_AMBULATORY_CARE_PROVIDER_SITE_OTHER): Payer: Medicare Other | Admitting: Family Medicine

## 2017-11-03 ENCOUNTER — Other Ambulatory Visit: Payer: Self-pay | Admitting: Neurosurgery

## 2017-11-03 VITALS — BP 142/62 | HR 84 | Temp 98.9°F | Resp 18 | Wt 277.8 lb

## 2017-11-03 DIAGNOSIS — E119 Type 2 diabetes mellitus without complications: Secondary | ICD-10-CM

## 2017-11-03 DIAGNOSIS — Z794 Long term (current) use of insulin: Secondary | ICD-10-CM

## 2017-11-03 DIAGNOSIS — Z23 Encounter for immunization: Secondary | ICD-10-CM

## 2017-11-03 DIAGNOSIS — M549 Dorsalgia, unspecified: Secondary | ICD-10-CM

## 2017-11-03 DIAGNOSIS — I10 Essential (primary) hypertension: Secondary | ICD-10-CM | POA: Diagnosis not present

## 2017-11-03 DIAGNOSIS — G8929 Other chronic pain: Secondary | ICD-10-CM | POA: Diagnosis not present

## 2017-11-03 DIAGNOSIS — M4800 Spinal stenosis, site unspecified: Secondary | ICD-10-CM

## 2017-11-03 MED ORDER — BUDESONIDE 32 MCG/ACT NA SUSP: 1.0000 | Freq: Every day | NASAL | 12 refills | Status: DC | PRN

## 2017-11-03 MED ORDER — OXYCODONE HCL ER 40 MG PO T12A: 40.0000 mg | EXTENDED_RELEASE_TABLET | Freq: Three times a day (TID) | ORAL | 0 refills | Status: DC | PRN

## 2017-11-03 NOTE — Progress Notes (Signed)
Jerry Parsons  MRN: [id number] DOB: 1951/10/09  Subjective:  HPI  Patient is here for 2 months follow up. Last office visit was on 09/14/17 for routine check up.  At that time due to worsening of back pain and neuropathy patient was advised to stop Gabapentin and increase Lyrica dose. He also got a foot massage bath to help with circulation and feet do feel better. His back is still bothering him but he saw Dr Roy-neurosurgeon and has CT scan scheduled for his back on 11/09/17.  Patient did re start Lipitor as instructed on last visit but after 1 week had to stop due to leg pain on this medication.  Wt Readings from Last 3 Encounters:  11/03/17 277 lb 12.8 oz (126 kg)  09/14/17 273 lb 12.8 oz (124.2 kg)  08/31/17 278 lb 3.2 oz (126.2 kg)   Patient Active Problem List   Diagnosis Date Noted  . OA (osteoarthritis) of knee 06/20/2017  . Sepsis ([place]) 05/08/2017  . Sepsis due to pneumonia ([place]) 05/07/2017  . Neuropathy 10/13/2016  . Injury of kidney 01/21/2016  . Amblyopia 12/30/2015  . 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 NS (nuclear sclerosis) 12/30/2015  . Pseudoaphakia 12/30/2015  . CVA (cerebral infarction) 09/08/2015  . Dehydration 09/08/2015  . Aspiration pneumonia ([place]) 07/04/2015  . Paroxysmal atrial fibrillation ([place]) 07/04/2015  . Arthritis of knee, degenerative 05/28/2015  . Chronic back pain 09/17/2014  . Leg edema 05/07/2014  . Chronic diastolic CHF (congestive heart failure) ([place]) 05/07/2014  . Campylobacter diarrhea 04/25/2013  . Atrial flutter ([place]) 01/23/2013  . Obesity 05/19/2012  . Hyperlipidemia 11/11/2011  . Diastolic dysfunction 93/79/0240  . SOB (shortness of breath) 04/12/2011  . Diabetes mellitus type 2, uncontrolled, with complications ([place]) 97/35/3299  . HYPERTENSION, BENIGN 03/15/2011  . DVT 03/15/2011  . TACHYCARDIA 03/15/2011    Past Medical History:  Diagnosis Date  . Arrhythmia    tachycardia, A-Fib  . Arthritis   . Blind right eye   . BPH  (benign prostatic hyperplasia)   . Diabetes mellitus   . DVT (deep venous thrombosis) ([place]) 02/2010   leg thrombus ; dislodged into emboli and caused PE  . Dysrhythmia   . 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 GERD (gastroesophageal reflux disease)   . Hypercholesteremia   . Hypertension   . Kidney failure   . Neuropathy   . Pneumonia    time 9 ;last episode 12/2015  . Pulmonary embolus ([place]) 2011  . Seasonal allergies   . Seizures ([place])    as child   . Sleep apnea    BIPAP  . Stiff neck    limited turning s/p titanium plate placement  . TIA (transient ischemic attack)   . Wears dentures    full upper and lower    Social History   Socioeconomic History  . Marital status: Married    Spouse name: Not on file  . Number of children: Not on file  . Years of education: Not on file  . Highest education level: Not on file  Social Needs  . Financial resource strain: Not on file  . Food insecurity - worry: Not on file  . Food insecurity - inability: Not on file  . Transportation needs - medical: Not on file  . Transportation needs - non-medical: Not on file  Occupational History  . Not on file  Tobacco Use  . Smoking status: Former Smoker    Packs/day: 2.00  Years: 20.00    Pack years: 40.00    Types: Cigarettes    Last attempt to quit: 12/26/1989    Years since quitting: 27.8  . Smokeless tobacco: Never Used  Substance and Sexual Activity  . Alcohol use: No  . Drug use: No  . Sexual activity: Not on file  Other Topics Concern  . Not on file  Social History Narrative  . Not on file    Outpatient Encounter Medications as of 11/03/2017  Medication Sig  . amiodarone (PACERONE) 200 MG tablet TAKE 1 TABLET (200 MG TOTAL) BY MOUTH DAILY. (Patient taking differently: Take 200 mg by mouth daily. TAKE 1 TABLET (200 MG TOTAL) BY MOUTH DAILY.)  . aspirin 81 MG tablet Take 81 mg by mouth daily.  [name] atorvastatin (LIPITOR) 40 MG tablet TAKE 1 TABLET (40 MG  TOTAL) BY MOUTH DAILY AT 6 PM.  . budesonide (RHINOCORT AQUA) 32 MCG/ACT nasal spray Place 1 spray into the nose daily as needed (for allergies.).   [name] cyclobenzaprine (FLEXERIL) 10 MG tablet Take 10 mg by mouth 3 (three) times daily as needed for muscle spasms.  [name] dutasteride (AVODART) 0.5 MG capsule Take 1 capsule (0.5 mg total) by mouth daily.  [name] esomeprazole (NEXIUM) 40 MG capsule TAKE 1 CAPSULE TWICE DAILY  . fexofenadine (ALLEGRA) 180 MG tablet Take 180 mg by mouth daily.    . furosemide (LASIX) 40 MG tablet Take 1 tablet (40 mg total) by mouth daily.  [name] glucose blood (ONE TOUCH ULTRA TEST) test strip Check sugar twice daily. Dx- E11.8  . hydrochlorothiazide (HYDRODIURIL) 12.5 MG tablet TAKE 2 TABLETS (25 MG TOTAL) BY MOUTH AT BEDTIME.  . insulin aspart (NOVOLOG FLEXPEN) 100 UNIT/ML FlexPen INJECT 14 UNITS 3 TIMES A   DAY (MORNING, NOON, & NIGHT)  . Insulin Pen Needle (PEN NEEDLES) 31G X 5 MM MISC 1 each by Does not apply route daily. PATIENT NEEDS NOVA TWIST NEEDLES 5 MM  DX E11.9  . LANTUS SOLOSTAR 100 UNIT/ML Solostar Pen Inject 30 Units into the skin 2 (two) times daily. (Patient taking differently: Inject 35 Units into the skin 2 (two) times daily. )  . liraglutide (VICTOZA) 18 MG/3ML SOPN INJECT 0.3ML (=1.8MG ) INTO THE SKIN DAILY  . magnesium oxide (MAG-OX) 400 (241.3 Mg) MG tablet TAKE 1 TABLET (400 MG TOTAL) BY MOUTH 2 (TWO) TIMES DAILY.  . metFORMIN (GLUCOPHAGE) 1000 MG tablet TAKE 1 TABLET (1,000 MG TOTAL) BY MOUTH 2 (TWO) TIMES DAILY.  . metoprolol (LOPRESSOR) 100 MG tablet TAKE 1 TABLET (100 MG TOTAL) BY MOUTH 2 (TWO) TIMES DAILY.  . mometasone (ELOCON) 0.1 % cream APPLY 1 APPLICATION TOPICALLY DAILY. FOR 2 WEEKS (Patient taking differently: Apply 1 application topically daily. For 2 weeks)  . Multiple Vitamins-Minerals (CENTRUM SILVER 50+MEN PO) Take by mouth.  . NON FORMULARY CPAP  . Omega-3 Fatty Acids (FISH OIL) 1200 MG CAPS Take 1,200 mg by mouth daily.  [name] oxyCODONE  (OXYCONTIN) 40 mg 12 hr tablet Take 1 tablet (40 mg total) by mouth every 8 (eight) hours as needed.  [name] oxyCODONE (OXYCONTIN) 40 mg 12 hr tablet Take 1 tablet (40 mg total) by mouth every 8 (eight) hours as needed.  . pregabalin (LYRICA) 100 MG capsule Take 1 tablet in the morning and 2 tablets in the evening  . ranitidine (ZANTAC) 300 MG tablet TAKE 1 TABLET BY MOUTH EVERY DAY  . sitaGLIPtin (JANUVIA) 100 MG tablet Take 1 tablet (100 mg total) by mouth daily.  [name]  Testosterone Cypionate (DEPO-TESTOSTERONE IM) Inject into the muscle every 21 ( twenty-one) days.  . [DISCONTINUED] atorvastatin (LIPITOR) 40 MG tablet Take 1 tablet (40 mg total) by mouth daily.   No facility-administered encounter medications on file as of 11/03/2017.     Allergies  Allergen Reactions  . Carisoprodol Itching    Review of Systems  Constitutional: Negative.   Respiratory: Negative.   Cardiovascular: Negative.   Gastrointestinal: Negative.   Musculoskeletal: Positive for back pain and joint pain.  Neurological: Positive for tingling.  Endo/Heme/Allergies: Negative.   Psychiatric/Behavioral: Negative.     Objective:  BP (!) 142/62   Pulse 84   Temp 98.9 F (37.2 C)   Resp 18   Wt 277 lb 12.8 oz (126 kg)   BMI 38.75 kg/m   Physical Exam  Constitutional: He is oriented to person, place, and time and well-developed, well-nourished, and in no distress.  HENT:  Head: Normocephalic and atraumatic.  Eyes: Conjunctivae are normal. No scleral icterus.  Neck: No thyromegaly present.  Cardiovascular: Normal rate, regular rhythm and normal heart sounds.  Pulmonary/Chest: Effort normal and breath sounds normal.  Abdominal: Soft.  Neurological: He is alert and oriented to person, place, and time. Gait normal. GCS score is 15.  Skin: Skin is warm and dry.  Psychiatric: Mood, memory, affect and judgment normal.    Assessment and Plan :  DDD RF oxycontin. TIIDM D/c Victoza Obesity OA HTN  I have done  the exam and reviewed the chart and it is accurate to the best of my knowledge. Development worker, community has been used and  any errors in dictation or transcription are unintentional. Miguel Aschoff M.D. [facility]

## 2017-11-09 ENCOUNTER — Ambulatory Visit (HOSPITAL_COMMUNITY): Admission: RE | Admit: 2017-11-09 | Payer: Medicare Other | Source: Ambulatory Visit

## 2017-11-09 ENCOUNTER — Ambulatory Visit
Admission: RE | Admit: 2017-11-09 | Discharge: 2017-11-09 | Disposition: A | Discharge status: Home / Self Care | Payer: Medicare Other | Source: Ambulatory Visit | Attending: Neurosurgery | Admitting: Neurosurgery

## 2017-11-09 DIAGNOSIS — M4800 Spinal stenosis, site unspecified: Secondary | ICD-10-CM | POA: Diagnosis not present

## 2017-11-09 DIAGNOSIS — M4326 Fusion of spine, lumbar region: Secondary | ICD-10-CM | POA: Diagnosis not present

## 2017-11-09 DIAGNOSIS — E291 Testicular hypofunction: Secondary | ICD-10-CM | POA: Diagnosis not present

## 2017-11-23 DIAGNOSIS — Z6838 Body mass index (BMI) 38.0-38.9, adult: Secondary | ICD-10-CM | POA: Diagnosis not present

## 2017-11-23 DIAGNOSIS — M4716 Other spondylosis with myelopathy, lumbar region: Secondary | ICD-10-CM | POA: Diagnosis not present

## 2017-11-26 ENCOUNTER — Other Ambulatory Visit: Payer: Self-pay | Admitting: Family Medicine

## 2017-11-26 DIAGNOSIS — Z794 Long term (current) use of insulin: Principal | ICD-10-CM

## 2017-11-26 DIAGNOSIS — E118 Type 2 diabetes mellitus with unspecified complications: Secondary | ICD-10-CM

## 2017-11-26 DIAGNOSIS — E1165 Type 2 diabetes mellitus with hyperglycemia: Secondary | ICD-10-CM

## 2017-11-26 DIAGNOSIS — IMO0002 Reserved for concepts with insufficient information to code with codable children: Secondary | ICD-10-CM

## 2017-12-13 ENCOUNTER — Ambulatory Visit
Admission: RE | Admit: 2017-12-13 | Discharge: 2017-12-13 | Disposition: A | Discharge status: Home / Self Care | Payer: Medicare Other | Source: Ambulatory Visit | Attending: Family Medicine | Admitting: Family Medicine

## 2017-12-13 ENCOUNTER — Encounter: Payer: Self-pay | Admitting: Family Medicine

## 2017-12-13 ENCOUNTER — Ambulatory Visit (INDEPENDENT_AMBULATORY_CARE_PROVIDER_SITE_OTHER): Payer: Medicare Other | Admitting: Family Medicine

## 2017-12-13 VITALS — BP 152/60 | HR 78 | Temp 98.1°F | Resp 16 | Wt 273.0 lb

## 2017-12-13 DIAGNOSIS — R140 Abdominal distension (gaseous): Secondary | ICD-10-CM | POA: Diagnosis not present

## 2017-12-13 DIAGNOSIS — R110 Nausea: Secondary | ICD-10-CM

## 2017-12-13 MED ORDER — SUCRALFATE 1 G PO TABS: 1.0000 g | ORAL_TABLET | Freq: Three times a day (TID) | ORAL | 1 refills | Status: DC

## 2017-12-13 NOTE — Patient Instructions (Addendum)
Stop metformin for at least a week. Try Glycolax daily. Stop Meloxicam. Call Dawson Bills about adding adding upper endoscopy.

## 2017-12-13 NOTE — Progress Notes (Signed)
[name]    DOB: 10/06/1951   66 y.o.   MRN: [id number] Visit Date: 12/13/2017  Today's Provider: [name], MD   Chief Complaint  Patient presents with  . Nausea   Subjective:    HPI Pt reports that since before his last  OV he has been having nausea (11/03/17). He reports that his stomach feels tight after eating anything and feels nauseous after meals. He also reports that he has been having shortness of breath at night when he lays down. He was told at last OV to back off of the Victoza. This did not help so he started it back. He reports that his stomach does not hurt but it just feels bloated. He reports that he can not get a meal down, he feels very full after just a biscuit. He reports that when his stomach is very bloated that is when he gets more short of breath. He reports that she shortness of breath does not happen every night. He reports that his constipation has gotten worse.       Allergies  Allergen Reactions  . Carisoprodol Itching     Current Outpatient Medications:  .  amiodarone (PACERONE) 200 MG tablet, TAKE 1 TABLET (200 MG TOTAL) BY MOUTH DAILY. (Patient taking differently: Take 200 mg by mouth daily. TAKE 1 TABLET (200 MG TOTAL) BY MOUTH DAILY.), Disp: 180 tablet, Rfl: 3 .  aspirin 81 MG tablet, Take 81 mg by mouth daily., Disp: , Rfl:  .  atorvastatin (LIPITOR) 40 MG tablet, TAKE 1 TABLET (40 MG TOTAL) BY MOUTH DAILY AT 6 PM., Disp: 90 tablet, Rfl: 3 .  budesonide (RHINOCORT AQUA) 32 MCG/ACT nasal spray, Place 1 spray daily as needed into both nostrils (for allergies.)., Disp: 1 Bottle, Rfl: 12 .  cyclobenzaprine (FLEXERIL) 10 MG tablet, Take 10 mg by mouth 3 (three) times daily as needed for muscle spasms., Disp: , Rfl:  .  dutasteride (AVODART) 0.5 MG capsule, Take 1 capsule (0.5 mg total) by mouth daily., Disp: 90 capsule, Rfl: 3 .  esomeprazole (NEXIUM) 40 MG capsule, TAKE 1 CAPSULE TWICE DAILY, Disp: 90 capsule, Rfl:  3 .  fexofenadine (ALLEGRA) 180 MG tablet, Take 180 mg by mouth daily.  , Disp: , Rfl:  .  furosemide (LASIX) 40 MG tablet, Take 1 tablet (40 mg total) by mouth daily., Disp: 30 tablet, Rfl: 0 .  glucose blood (ONE TOUCH ULTRA TEST) test strip, CHECK SUGAR TWICE DAILY. DX- E11.8, Disp: 100 each, Rfl: 3 .  hydrochlorothiazide (HYDRODIURIL) 12.5 MG tablet, TAKE 2 TABLETS (25 MG TOTAL) BY MOUTH AT BEDTIME., Disp: 90 tablet, Rfl: 3 .  insulin aspart (NOVOLOG FLEXPEN) 100 UNIT/ML FlexPen, INJECT 14 UNITS 3 TIMES A   DAY (MORNING, NOON, & NIGHT), Disp: 15 pen, Rfl: 3 .  Insulin Pen Needle (PEN NEEDLES) 31G X 5 MM MISC, 1 each by Does not apply route daily. PATIENT NEEDS NOVA TWIST NEEDLES 5 MM  DX E11.9, Disp: 200 each, Rfl: 3 .  LANTUS SOLOSTAR 100 UNIT/ML Solostar Pen, Inject 30 Units into the skin 2 (two) times daily. (Patient taking differently: Inject 35 Units into the skin 2 (two) times daily. ), Disp: 15 mL, Rfl: 3 .  liraglutide (VICTOZA) 18 MG/3ML SOPN, INJECT 0.3ML (=1.8MG ) INTO THE SKIN DAILY, Disp: 27 mL, Rfl: 3 .  magnesium oxide (MAG-OX) 400 (241.3 Mg) MG tablet, TAKE 1 TABLET (400 MG TOTAL) BY MOUTH 2 (TWO) TIMES  DAILY., Disp: 180 tablet, Rfl: 3 .  metFORMIN (GLUCOPHAGE) 1000 MG tablet, TAKE 1 TABLET (1,000 MG TOTAL) BY MOUTH 2 (TWO) TIMES DAILY., Disp: 60 tablet, Rfl: 11 .  metoprolol (LOPRESSOR) 100 MG tablet, TAKE 1 TABLET (100 MG TOTAL) BY MOUTH 2 (TWO) TIMES DAILY., Disp: 60 tablet, Rfl: 11 .  mometasone (ELOCON) 0.1 % cream, APPLY 1 APPLICATION TOPICALLY DAILY. FOR 2 WEEKS (Patient taking differently: Apply 1 application topically daily. For 2 weeks), Disp: 45 g, Rfl: 2 .  Multiple Vitamins-Minerals (CENTRUM SILVER 50+MEN PO), Take by mouth., Disp: , Rfl:  .  NON FORMULARY, CPAP, Disp: , Rfl:  .  Omega-3 Fatty Acids (FISH OIL) 1200 MG CAPS, Take 1,200 mg by mouth daily., Disp: , Rfl:  .  oxyCODONE (OXYCONTIN) 40 mg 12 hr tablet, Take 1 tablet (40 mg total) by mouth every 8 (eight)  hours as needed., Disp: 270 tablet, Rfl: 0 .  oxyCODONE (OXYCONTIN) 40 mg 12 hr tablet, Take 1 tablet (40 mg total) every 8 (eight) hours as needed by mouth., Disp: 270 tablet, Rfl: 0 .  pregabalin (LYRICA) 100 MG capsule, Take 1 tablet in the morning and 2 tablets in the evening, Disp: 90 capsule, Rfl: 5 .  ranitidine (ZANTAC) 300 MG tablet, TAKE 1 TABLET BY MOUTH EVERY DAY, Disp: 90 tablet, Rfl: 3 .  sitaGLIPtin (JANUVIA) 100 MG tablet, Take 1 tablet (100 mg total) by mouth daily., Disp: 90 tablet, Rfl: 3 .  Testosterone Cypionate (DEPO-TESTOSTERONE IM), Inject into the muscle every 21 ( twenty-one) days., Disp: , Rfl:   Review of Systems  Constitutional: Negative.   HENT: Negative.   Eyes: Negative.   Respiratory: Positive for shortness of breath.   Cardiovascular: Negative.   Gastrointestinal: Positive for abdominal distention, constipation and nausea.  Endocrine: Negative.   Genitourinary: Negative.   Musculoskeletal: Positive for myalgias.  Skin: Negative.   Allergic/Immunologic: Negative.   Neurological: Negative.   Hematological: Negative.     Social History   Tobacco Use  . Smoking status: Former Smoker    Packs/day: 2.00    Years: 20.00    Pack years: 40.00    Types: Cigarettes    Last attempt to quit: 12/26/1989    Years since quitting: 27.9  . Smokeless tobacco: Never Used  Substance Use Topics  . Alcohol use: No   Objective:   BP (!) 152/60 (BP Location: Left Arm, Patient Position: Sitting, Cuff Size: Large)   Pulse 78   Temp 98.1 F (36.7 C) (Oral)   Resp 16   Wt 273 lb (123.8 kg)   SpO2 98%   BMI 38.08 kg/m  Vitals:   12/13/17 1033  BP: (!) 152/60  Pulse: 78  Resp: 16  Temp: 98.1 F (36.7 C)  TempSrc: Oral  SpO2: 98%  Weight: 273 lb (123.8 kg)     Physical Exam  Constitutional: He is oriented to person, place, and time. He appears well-developed and well-nourished.  Eyes: Conjunctivae and EOM are normal. Pupils are equal, round, and  reactive to light.  Neck: Normal range of motion. Neck supple.  Cardiovascular: Normal rate, regular rhythm, normal heart sounds and intact distal pulses.  Pulmonary/Chest: Effort normal and breath sounds normal.  Abdominal: Soft. Bowel sounds are normal. He exhibits distension.  Abdomen feels mildly distended.  Musculoskeletal: Normal range of motion.  Neurological: He is alert and oriented to person, place, and time. He has normal reflexes.  Skin: Skin is warm and dry.  Psychiatric: He has a  normal mood and affect. His behavior is normal. Judgment and thought content normal.        Assessment & Plan:     1. Nausea  - CBC with Differential/Platelet - Comprehensive metabolic panel - Lipase - sucralfate (CARAFATE) 1 g tablet; Take 1 tablet (1 g total) by mouth 4 (four) times daily -  with meals and at bedtime.  Dispense: 120 tablet; Refill: 1  2. Abdominal bloating I think some of this could be chronic constipation.Consider Linzess or Amitiza. - CBC with Differential/Platelet - Lipase - US Abdomen Limited; Future - DG Abd 1 View; Future 3.TIIDM 4.Othopnea Doubt CHF or cardiac.     HPI, Exam, and A&P Transcribed under the direction and in the presence of Richard L. Cranford Mon, MD  Electronically Signed: Katina Dung, CMA  I have done the exam and reviewed the above chart and it is accurate to the best of my knowledge. Development worker, community has been used in this note in any air is in the dictation or transcription are unintentional.  [name], MD  [place]

## 2017-12-14 ENCOUNTER — Other Ambulatory Visit: Payer: Self-pay | Admitting: Emergency Medicine

## 2017-12-14 DIAGNOSIS — R110 Nausea: Secondary | ICD-10-CM

## 2017-12-14 DIAGNOSIS — R140 Abdominal distension (gaseous): Secondary | ICD-10-CM

## 2017-12-14 LAB — CBC WITH DIFFERENTIAL/PLATELET
Basophils Absolute: 22 cells/uL (ref 0–200)
Basophils Relative: 0.3 %
Eosinophils Absolute: 170 cells/uL (ref 15–500)
Eosinophils Relative: 2.3 %
HCT: 40.7 % (ref 38.5–50.0)
Hemoglobin: 12.9 g/dL — ABNORMAL LOW (ref 13.2–17.1)
Lymphs Abs: 1702 cells/uL (ref 850–3900)
MCH: 24.7 pg — ABNORMAL LOW (ref 27.0–33.0)
MCHC: 31.7 g/dL — ABNORMAL LOW (ref 32.0–36.0)
MCV: 77.8 fL — ABNORMAL LOW (ref 80.0–100.0)
MPV: 10.3 fL (ref 7.5–12.5)
Monocytes Relative: 6.9 %
Neutro Abs: 4995 cells/uL (ref 1500–7800)
Neutrophils Relative %: 67.5 %
Platelets: 165 10*3/uL (ref 140–400)
RBC: 5.23 10*6/uL (ref 4.20–5.80)
RDW: 15.8 % — ABNORMAL HIGH (ref 11.0–15.0)
Total Lymphocyte: 23 %
WBC mixed population: 511 cells/uL (ref 200–950)
WBC: 7.4 10*3/uL (ref 3.8–10.8)

## 2017-12-14 LAB — COMPLETE METABOLIC PANEL WITH GFR
AG Ratio: 1.4 (calc) (ref 1.0–2.5)
ALT: 37 U/L (ref 9–46)
AST: 30 U/L (ref 10–35)
Albumin: 4.3 g/dL (ref 3.6–5.1)
Alkaline phosphatase (APISO): 97 U/L (ref 40–115)
BUN/Creatinine Ratio: 19 (calc) (ref 6–22)
BUN: 26 mg/dL — ABNORMAL HIGH (ref 7–25)
CO2: 32 mmol/L (ref 20–32)
Calcium: 10.1 mg/dL (ref 8.6–10.3)
Chloride: 98 mmol/L (ref 98–110)
Creat: 1.39 mg/dL — ABNORMAL HIGH (ref 0.70–1.25)
GFR, Est African American: 60 mL/min/{1.73_m2} (ref >=60)
GFR, Est Non African American: 52 mL/min/{1.73_m2} — ABNORMAL LOW (ref >=60)
Globulin: 3 g/dL (calc) (ref 1.9–3.7)
Glucose, Bld: 196 mg/dL — ABNORMAL HIGH (ref 65–99)
Potassium: 4.3 mmol/L (ref 3.5–5.3)
Sodium: 139 mmol/L (ref 135–146)
Total Bilirubin: 0.4 mg/dL (ref 0.2–1.2)
Total Protein: 7.3 g/dL (ref 6.1–8.1)

## 2017-12-14 LAB — LIPASE: Lipase: 30 U/L (ref 7–60)

## 2017-12-15 ENCOUNTER — Other Ambulatory Visit: Payer: Self-pay | Admitting: Family Medicine

## 2017-12-15 ENCOUNTER — Ambulatory Visit
Admission: RE | Admit: 2017-12-15 | Discharge: 2017-12-15 | Disposition: A | Discharge status: Home / Self Care | Payer: Medicare Other | Source: Ambulatory Visit | Attending: Family Medicine | Admitting: Family Medicine

## 2017-12-15 DIAGNOSIS — Z9049 Acquired absence of other specified parts of digestive tract: Secondary | ICD-10-CM | POA: Insufficient documentation

## 2017-12-15 DIAGNOSIS — K760 Fatty (change of) liver, not elsewhere classified: Secondary | ICD-10-CM | POA: Diagnosis not present

## 2017-12-15 DIAGNOSIS — R140 Abdominal distension (gaseous): Secondary | ICD-10-CM | POA: Diagnosis not present

## 2017-12-26 ENCOUNTER — Ambulatory Visit: Payer: Self-pay | Admitting: Family Medicine

## 2017-12-30 ENCOUNTER — Other Ambulatory Visit: Payer: Self-pay | Admitting: Family Medicine

## 2017-12-30 MED ORDER — FUROSEMIDE 40 MG PO TABS: 40.0000 mg | ORAL_TABLET | Freq: Every day | ORAL | 11 refills | Status: DC

## 2017-12-30 NOTE — Telephone Encounter (Signed)
[facility] pharmacy faxed a refill request for the following medication. Thanks CC  furosemide (LASIX) 40 MG tablet

## 2018-01-02 ENCOUNTER — Ambulatory Visit: Payer: Self-pay | Admitting: Family Medicine

## 2018-01-04 DIAGNOSIS — K219 Gastro-esophageal reflux disease without esophagitis: Secondary | ICD-10-CM | POA: Diagnosis not present

## 2018-01-04 DIAGNOSIS — R1319 Other dysphagia: Secondary | ICD-10-CM | POA: Diagnosis not present

## 2018-01-05 DIAGNOSIS — E291 Testicular hypofunction: Secondary | ICD-10-CM | POA: Diagnosis not present

## 2018-01-13 ENCOUNTER — Encounter: Payer: Self-pay | Admitting: Student

## 2018-01-16 ENCOUNTER — Encounter
Admission: RE | Disposition: A | Discharge status: Home / Self Care | Payer: Self-pay | Source: Ambulatory Visit | Attending: Unknown Physician Specialty

## 2018-01-16 ENCOUNTER — Ambulatory Visit
Admission: RE | Admit: 2018-01-16 | Discharge: 2018-01-16 | Disposition: A | Discharge status: Home / Self Care | Payer: Medicare Other | Source: Ambulatory Visit | Attending: Unknown Physician Specialty | Admitting: Unknown Physician Specialty

## 2018-01-16 ENCOUNTER — Encounter: Payer: Self-pay | Admitting: *Deleted

## 2018-01-16 ENCOUNTER — Ambulatory Visit: Payer: Medicare Other | Admitting: Anesthesiology

## 2018-01-16 DIAGNOSIS — E1122 Type 2 diabetes mellitus with diabetic chronic kidney disease: Secondary | ICD-10-CM | POA: Insufficient documentation

## 2018-01-16 DIAGNOSIS — Z79899 Other long term (current) drug therapy: Secondary | ICD-10-CM | POA: Insufficient documentation

## 2018-01-16 DIAGNOSIS — N189 Chronic kidney disease, unspecified: Secondary | ICD-10-CM | POA: Insufficient documentation

## 2018-01-16 DIAGNOSIS — K219 Gastro-esophageal reflux disease without esophagitis: Secondary | ICD-10-CM | POA: Insufficient documentation

## 2018-01-16 DIAGNOSIS — K3189 Other diseases of stomach and duodenum: Secondary | ICD-10-CM | POA: Diagnosis not present

## 2018-01-16 DIAGNOSIS — Z86010 Personal history of colonic polyps: Secondary | ICD-10-CM | POA: Diagnosis not present

## 2018-01-16 DIAGNOSIS — Z7982 Long term (current) use of aspirin: Secondary | ICD-10-CM | POA: Insufficient documentation

## 2018-01-16 DIAGNOSIS — G4730 Sleep apnea, unspecified: Secondary | ICD-10-CM | POA: Diagnosis not present

## 2018-01-16 DIAGNOSIS — R1310 Dysphagia, unspecified: Secondary | ICD-10-CM | POA: Insufficient documentation

## 2018-01-16 DIAGNOSIS — Z86718 Personal history of other venous thrombosis and embolism: Secondary | ICD-10-CM | POA: Diagnosis not present

## 2018-01-16 DIAGNOSIS — K648 Other hemorrhoids: Secondary | ICD-10-CM | POA: Diagnosis not present

## 2018-01-16 DIAGNOSIS — K2950 Unspecified chronic gastritis without bleeding: Secondary | ICD-10-CM | POA: Diagnosis not present

## 2018-01-16 DIAGNOSIS — N400 Enlarged prostate without lower urinary tract symptoms: Secondary | ICD-10-CM | POA: Diagnosis not present

## 2018-01-16 DIAGNOSIS — I110 Hypertensive heart disease with heart failure: Secondary | ICD-10-CM | POA: Diagnosis not present

## 2018-01-16 DIAGNOSIS — E1140 Type 2 diabetes mellitus with diabetic neuropathy, unspecified: Secondary | ICD-10-CM | POA: Diagnosis not present

## 2018-01-16 DIAGNOSIS — I129 Hypertensive chronic kidney disease with stage 1 through stage 4 chronic kidney disease, or unspecified chronic kidney disease: Secondary | ICD-10-CM | POA: Insufficient documentation

## 2018-01-16 DIAGNOSIS — Z87891 Personal history of nicotine dependence: Secondary | ICD-10-CM | POA: Insufficient documentation

## 2018-01-16 DIAGNOSIS — E785 Hyperlipidemia, unspecified: Secondary | ICD-10-CM | POA: Diagnosis not present

## 2018-01-16 DIAGNOSIS — K5730 Diverticulosis of large intestine without perforation or abscess without bleeding: Secondary | ICD-10-CM | POA: Diagnosis not present

## 2018-01-16 DIAGNOSIS — K640 First degree hemorrhoids: Secondary | ICD-10-CM | POA: Insufficient documentation

## 2018-01-16 DIAGNOSIS — Z8673 Personal history of transient ischemic attack (TIA), and cerebral infarction without residual deficits: Secondary | ICD-10-CM | POA: Diagnosis not present

## 2018-01-16 DIAGNOSIS — Z86711 Personal history of pulmonary embolism: Secondary | ICD-10-CM | POA: Diagnosis not present

## 2018-01-16 DIAGNOSIS — K2970 Gastritis, unspecified, without bleeding: Secondary | ICD-10-CM | POA: Diagnosis not present

## 2018-01-16 DIAGNOSIS — Z1211 Encounter for screening for malignant neoplasm of colon: Secondary | ICD-10-CM | POA: Insufficient documentation

## 2018-01-16 DIAGNOSIS — E119 Type 2 diabetes mellitus without complications: Secondary | ICD-10-CM | POA: Diagnosis not present

## 2018-01-16 DIAGNOSIS — E7800 Pure hypercholesterolemia, unspecified: Secondary | ICD-10-CM | POA: Diagnosis not present

## 2018-01-16 DIAGNOSIS — I5032 Chronic diastolic (congestive) heart failure: Secondary | ICD-10-CM | POA: Diagnosis not present

## 2018-01-16 DIAGNOSIS — Z794 Long term (current) use of insulin: Secondary | ICD-10-CM | POA: Insufficient documentation

## 2018-01-16 DIAGNOSIS — K5790 Diverticulosis of intestine, part unspecified, without perforation or abscess without bleeding: Secondary | ICD-10-CM | POA: Diagnosis not present

## 2018-01-16 HISTORY — DX: Dyspnea, unspecified: R06.00

## 2018-01-16 HISTORY — DX: Cerebral infarction, unspecified: I63.9

## 2018-01-16 HISTORY — PX: COLONOSCOPY WITH PROPOFOL: SHX5780

## 2018-01-16 HISTORY — DX: Myoneural disorder, unspecified: G70.9

## 2018-01-16 HISTORY — PX: ESOPHAGOGASTRODUODENOSCOPY (EGD) WITH PROPOFOL: SHX5813

## 2018-01-16 LAB — GLUCOSE, CAPILLARY: Glucose-Capillary: 278 mg/dL — ABNORMAL HIGH (ref 65–99)

## 2018-01-16 SURGERY — COLONOSCOPY WITH PROPOFOL
Anesthesia: General

## 2018-01-16 MED ORDER — LABETALOL HCL 5 MG/ML IV SOLN
INTRAVENOUS | Status: AC
  Filled 2018-01-16: qty 4

## 2018-01-16 MED ORDER — PIPERACILLIN-TAZOBACTAM 3.375 G IVPB
INTRAVENOUS | Status: AC
  Filled 2018-01-16: qty 50

## 2018-01-16 MED ORDER — PIPERACILLIN-TAZOBACTAM 3.375 G IVPB 30 MIN
3.3750 g | Freq: Once | INTRAVENOUS | Status: DC
  Filled 2018-01-16: qty 50

## 2018-01-16 MED ORDER — LIDOCAINE HCL (PF) 1 % IJ SOLN
INTRAMUSCULAR | Status: AC
  Filled 2018-01-16: qty 2

## 2018-01-16 MED ORDER — PROPOFOL 500 MG/50ML IV EMUL
INTRAVENOUS | Status: DC | PRN
  Administered 2018-01-16: 75 ug/kg/min via INTRAVENOUS

## 2018-01-16 MED ORDER — PROPOFOL 500 MG/50ML IV EMUL
INTRAVENOUS | Status: AC
  Filled 2018-01-16: qty 100

## 2018-01-16 MED ORDER — LIDOCAINE HCL (PF) 1 % IJ SOLN: 2.0000 mL | Freq: Once | INTRAMUSCULAR | Status: DC

## 2018-01-16 MED ORDER — SODIUM CHLORIDE 0.9 % IV SOLN
INTRAVENOUS | Status: DC
  Administered 2018-01-16: 1000 mL via INTRAVENOUS
  Administered 2018-01-16: 08:00:00 via INTRAVENOUS

## 2018-01-16 MED ORDER — LIDOCAINE HCL (CARDIAC) 20 MG/ML IV SOLN
INTRAVENOUS | Status: DC | PRN
  Administered 2018-01-16: 30 mg via INTRAVENOUS

## 2018-01-16 MED ORDER — PROPOFOL 10 MG/ML IV BOLUS
INTRAVENOUS | Status: DC | PRN
  Administered 2018-01-16 (×2): 50 mg via INTRAVENOUS

## 2018-01-16 MED ORDER — LABETALOL HCL 5 MG/ML IV SOLN
INTRAVENOUS | Status: DC | PRN
  Administered 2018-01-16: 5 mg via INTRAVENOUS

## 2018-01-16 MED ORDER — LIDOCAINE HCL (PF) 2 % IJ SOLN
INTRAMUSCULAR | Status: AC
  Filled 2018-01-16: qty 10

## 2018-01-16 MED ORDER — SODIUM CHLORIDE 0.9 % IV SOLN: INTRAVENOUS | Status: DC

## 2018-01-16 NOTE — Op Note (Signed)
[facility] Gastroenterology Patient Name: Jerry Parsons Procedure Date: 01/16/2018 7:25 AM MRN: [id number] Account #: [id number] Date of Birth: 02-08-1951 Admit Type: Outpatient Age: 67 Room: [facility] ENDO ROOM 1 Gender: Male Note Status: Finalized Procedure:            Upper GI endoscopy Indications:          Dysphagia Providers:            Manya Silvas, MD Referring MD:         Janine Ores. [name], MD (Referring MD) Medicines:            Propofol per Anesthesia Complications:        No immediate complications. Procedure:            Pre-Anesthesia Assessment:                       - After reviewing the risks and benefits, the patient                        was deemed in satisfactory condition to undergo the                        procedure.                       After obtaining informed consent, the endoscope was                        passed under direct vision. Throughout the procedure,                        the patient's blood pressure, pulse, and oxygen                        saturations were monitored continuously. The Endoscope                        was introduced through the mouth, and advanced to the                        second part of duodenum. The upper GI endoscopy was                        accomplished without difficulty. The patient tolerated                        the procedure well. Findings:      The examined esophagus was normal. A guidewire was placed and the scope       was withdrawn. Dilation was performed with a Savary dilator with mild       resistance at 17 mm.      Striped mildly erythematous mucosa without bleeding was found in the       gastric body less in antrum. and in the gastric antrum. Biopsies were       taken with a cold forceps for histology. Biopsies were taken with a cold       forceps for Helicobacter pylori testing.      The examined duodenum was normal. Impression:           - Normal esophagus. Dilated.            -  Erythematous mucosa in the gastric body and antrum.                        Biopsied.                       - Normal examined duodenum. Recommendation:       - Await pathology results. Manya Silvas, MD 01/16/2018 7:53:44 AM This report has been signed electronically. Number of Addenda: 0 Note Initiated On: 01/16/2018 7:25 AM      [facility]

## 2018-01-16 NOTE — Op Note (Signed)
[facility] Gastroenterology Patient Name: Jerry Parsons Procedure Date: 01/16/2018 7:23 AM MRN: [id number] Account #: [id number] Date of Birth: Dec 05, 1951 Admit Type: Outpatient Age: 68 Room: [facility] ENDO ROOM 1 Gender: Male Note Status: Finalized Procedure:            Colonoscopy Indications:          High risk colon cancer surveillance: Personal history                        of colonic polyps Providers:            Manya Silvas, MD Medicines:            Propofol per Anesthesia Complications:        No immediate complications. Procedure:            Pre-Anesthesia Assessment:                       - After reviewing the risks and benefits, the patient                        was deemed in satisfactory condition to undergo the                        procedure.                       After obtaining informed consent, the colonoscope was                        passed under direct vision. Throughout the procedure,                        the patient's blood pressure, pulse, and oxygen                        saturations were monitored continuously. The                        Colonoscope was introduced through the anus and                        advanced to the the cecum, identified by appendiceal                        orifice and ileocecal valve. The colonoscopy was                        performed without difficulty. The patient tolerated the                        procedure well. The quality of the bowel preparation                        was adequate to identify polyps. Findings:      A few small-mouthed diverticula were found in the sigmoid colon.      Internal hemorrhoids were found during endoscopy. The hemorrhoids were       small and Grade I (internal hemorrhoids that do not prolapse).      The exam was otherwise without abnormality. Impression:           -  Diverticulosis in the sigmoid colon.                       - Internal hemorrhoids.   - The examination was otherwise normal.                       - No specimens collected. Recommendation:       - Repeat colonoscopy in 5 years for surveillance. Manya Silvas, MD 01/16/2018 8:17:33 AM This report has been signed electronically. Number of Addenda: 0 Note Initiated On: 01/16/2018 7:23 AM Scope Withdrawal Time: 0 hours 9 minutes 28 seconds  Total Procedure Duration: 0 hours 17 minutes 40 seconds       [facility]

## 2018-01-16 NOTE — Addendum Note (Signed)
Addendum  created 01/16/18 0955 by Marsh Dolly, CRNA   Intraprocedure Meds edited

## 2018-01-16 NOTE — Anesthesia Preprocedure Evaluation (Signed)
Anesthesia Evaluation  Patient identified by MRN, date of birth, ID band Patient awake    Reviewed: Allergy & Precautions, NPO status , Patient's Chart, lab work & pertinent test results  History of Anesthesia Complications Negative for: history of anesthetic complications  Airway Mallampati: II  TM Distance: >3 FB Neck ROM: Full    Dental  (+) Upper Dentures, Lower Dentures   Pulmonary sleep apnea and Continuous Positive Airway Pressure Ventilation , neg COPD, former smoker,    breath sounds clear to auscultation- rhonchi (-) wheezing      Cardiovascular hypertension, Pt. on medications +CHF (diastolic)  (-) CAD, (-) Past MI, (-) Cardiac Stents and (-) CABG + dysrhythmias (currently sinus, 2 episodes of afib in past during pneumonia and after back surgery) Atrial Fibrillation  Rhythm:Regular Rate:Normal - Systolic murmurs and - Diastolic murmurs    Neuro/Psych negative psych ROS   GI/Hepatic Neg liver ROS, GERD  ,  Endo/Other  diabetes, Insulin Dependent  Renal/GU Renal InsufficiencyRenal disease     Musculoskeletal  (+) Arthritis ,   Abdominal (+) + obese,   Peds  Hematology negative hematology ROS (+)   Anesthesia Other Findings Past Medical History: No date: Arrhythmia     Comment:  tachycardia, A-Fib No date: Arthritis 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 No date: BPH (benign prostatic hyperplasia) No date: Diabetes mellitus 02/2010: DVT (deep venous thrombosis) (HCC)     Comment:  leg thrombus ; dislodged into emboli and caused PE No date: Dyspnea No date: Dysrhythmia No date: 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     Comment:  x2 No date: GERD (gastroesophageal reflux disease) No date: Hypercholesteremia No date: Hypertension No date: Kidney failure No date: Neuromuscular disorder (HCC) No date: Neuropathy No date: Pneumonia     Comment:  time 80 ;last episode 12/2015 2011: Pulmonary embolus ([place]) No  date: Seasonal allergies No date: Seizures ([place])     Comment:  as child  No date: Sleep apnea     Comment:  BIPAP No date: Stiff neck     Comment:  limited turning s/p titanium plate placement No date: Stroke ([place]) No date: TIA (transient ischemic attack) No date: Wears dentures     Comment:  full upper and lower   Reproductive/Obstetrics                             Anesthesia Physical Anesthesia Plan  ASA: III  Anesthesia Plan: General   Post-op Pain Management:    Induction: Intravenous  PONV Risk Score and Plan: 1 and Propofol infusion  Airway Management Planned: Natural Airway  Additional Equipment:   Intra-op Plan:   Post-operative Plan:   Informed Consent: I have reviewed the patients History and Physical, chart, labs and discussed the procedure including the risks, benefits and alternatives for the proposed anesthesia with the patient or authorized representative who has indicated his/her understanding and acceptance.   Dental advisory given  Plan Discussed with: CRNA and Anesthesiologist  Anesthesia Plan Comments:         Anesthesia Quick Evaluation

## 2018-01-16 NOTE — Transfer of Care (Signed)
Immediate Anesthesia Transfer of Care Note  Patient: Jerry Parsons  Procedure(s) Performed: COLONOSCOPY WITH PROPOFOL (N/A ) ESOPHAGOGASTRODUODENOSCOPY (EGD) WITH PROPOFOL (N/A )  Patient Location: PACU and Endoscopy Unit  Anesthesia Type:General  Level of Consciousness: awake, alert  and oriented  Airway & Oxygen Therapy: Patient Spontanous Breathing  Post-op Assessment: Report given to RN and Post -op Vital signs reviewed and stable  Post vital signs: Reviewed and stable  Last Vitals:  Vitals:   01/16/18 0657  BP: (!) 163/89  Pulse: (!) 123  Resp: 18  Temp: 36.7 C  SpO2: 95%    Last Pain:  Vitals:   01/16/18 0657  TempSrc: Tympanic         Complications: No apparent anesthesia complications

## 2018-01-16 NOTE — Anesthesia Postprocedure Evaluation (Signed)
Anesthesia Post Note  Patient: Jerry Parsons  Procedure(s) Performed: COLONOSCOPY WITH PROPOFOL (N/A ) ESOPHAGOGASTRODUODENOSCOPY (EGD) WITH PROPOFOL (N/A )  Patient location during evaluation: Endoscopy Anesthesia Type: General Level of consciousness: awake and alert and oriented Pain management: pain level controlled Vital Signs Assessment: post-procedure vital signs reviewed and stable Respiratory status: spontaneous breathing, nonlabored ventilation and respiratory function stable Cardiovascular status: blood pressure returned to baseline and stable Postop Assessment: no signs of nausea or vomiting Anesthetic complications: no     Last Vitals:  Vitals:   01/16/18 0841 01/16/18 0851  BP: (!) 153/69 (!) 143/79  Pulse: (!) 113 98  Resp: 10 11  Temp:    SpO2: 92% 92%    Last Pain:  Vitals:   01/16/18 0821  TempSrc: Tympanic                 Brynlyn Dade

## 2018-01-16 NOTE — H&P (Signed)
Primary Care Physician:  Jerrol Banana., MD Primary Gastroenterologist:  Dr. Vira Agar  Pre-Procedure History & Physical: HPI:  Jerry Parsons is a 67 y.o. male is here for an endoscopy and colonoscopy.   Past Medical History:  Diagnosis Date  . Arrhythmia    tachycardia, A-Fib  . Arthritis   . Blind right eye   . BPH (benign prostatic hyperplasia)   . Diabetes mellitus   . DVT (deep venous thrombosis) ([place]) 02/2010   leg thrombus ; dislodged into emboli and caused PE  . Dyspnea   . Dysrhythmia   . 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 GERD (gastroesophageal reflux disease)   . Hypercholesteremia   . Hypertension   . Kidney failure   . Neuromuscular disorder ([place])   . Neuropathy   . Pneumonia    time 9 ;last episode 12/2015  . Pulmonary embolus ([place]) 2011  . Seasonal allergies   . Seizures ([place])    as child   . Sleep apnea    BIPAP  . Stiff neck    limited turning s/p titanium plate placement  . Stroke ([place])   . TIA (transient ischemic attack)   . Wears dentures    full upper and lower    Past Surgical History:  Procedure Laterality Date  . APPENDECTOMY    . BACK SURGERY     x 8; upper x 3 & lower x 5  . CARDIOVERSION  03/14/13, 10/16   2014 - [place], 2016 - [place]  . CATARACT EXTRACTION W/PHACO Left 10/29/2015   Procedure: CATARACT EXTRACTION PHACO AND INTRAOCULAR LENS PLACEMENT (IOC);  Surgeon: Leandrew Koyanagi, MD;  Location: [facility];  Service: Ophthalmology;  Laterality: Left;  DIABETIC - insulin and oral medsSleep apnea - no machine  . CHOLECYSTECTOMY    . EYE SURGERY    . GALLBLADDER SURGERY  2002  . JOINT REPLACEMENT Right 2018  . KNEE ARTHROSCOPY     left   . ROTATOR CUFF REPAIR  2001   left   . TONSILLECTOMY    . TOTAL KNEE ARTHROPLASTY Right 06/20/2017   Procedure: RIGHT TOTAL KNEE ARTHROPLASTY;  Surgeon: Gaynelle Arabian, MD;  Location: [facility];  Service: Orthopedics;  Laterality: Right;    Prior to Admission  medications   Medication Sig Start Date End Date Taking? Authorizing Provider  gabapentin (NEURONTIN) 800 MG tablet Take 800 mg by mouth 3 (three) times daily.   Yes [provider]  vitamin B-12 (CYANOCOBALAMIN) 1000 MCG tablet Take 1,000 mcg by mouth daily.   Yes [provider]  amiodarone (PACERONE) 200 MG tablet TAKE 1 TABLET (200 MG TOTAL) BY MOUTH DAILY. Patient taking differently: Take 200 mg by mouth daily. TAKE 1 TABLET (200 MG TOTAL) BY MOUTH DAILY. 05/10/17   Vaughan Basta, MD  aspirin 81 MG tablet Take 81 mg by mouth daily.    [provider]  atorvastatin (LIPITOR) 40 MG tablet TAKE 1 TABLET (40 MG TOTAL) BY MOUTH DAILY AT 6 PM. 09/15/17   Jerrol Banana., MD  budesonide (RHINOCORT AQUA) 32 MCG/ACT nasal spray Place 1 spray daily as needed into both nostrils (for allergies.). 11/03/17   Jerrol Banana., MD  cyclobenzaprine (FLEXERIL) 10 MG tablet Take 10 mg by mouth 3 (three) times daily as needed for muscle spasms.    [provider]  dutasteride (AVODART) 0.5 MG capsule Take 1 capsule (0.5 mg total) by mouth daily. 08/17/17  Jerrol Banana., MD  esomeprazole (NEXIUM) 40 MG capsule TAKE 1 CAPSULE TWICE DAILY 09/19/17   Jerrol Banana., MD  fexofenadine (ALLEGRA) 180 MG tablet Take 180 mg by mouth daily.      [provider]  furosemide (LASIX) 40 MG tablet Take 1 tablet (40 mg total) by mouth daily. 12/30/17   Jerrol Banana., MD  glucose blood (ONE TOUCH ULTRA TEST) test strip CHECK SUGAR TWICE DAILY. DX- E11.8 11/28/17   Jerrol Banana., MD  hydrochlorothiazide (HYDRODIURIL) 12.5 MG tablet TAKE 2 TABLETS (25 MG TOTAL) BY MOUTH AT BEDTIME. 09/12/17   Jerrol Banana., MD  insulin aspart (NOVOLOG FLEXPEN) 100 UNIT/ML FlexPen INJECT 14 UNITS 3 TIMES A   DAY (MORNING, NOON, & NIGHT) 09/29/17   Jerrol Banana., MD  Insulin Pen Needle (PEN NEEDLES) 31G X 5 MM MISC 1 each by Does not apply  route daily. PATIENT NEEDS NOVA TWIST NEEDLES 5 MM  DX E11.9 10/18/16   Jerrol Banana., MD  JANUVIA 100 MG tablet TAKE 1 TABLET DAILY 12/15/17   Jerrol Banana., MD  LANTUS SOLOSTAR 100 UNIT/ML Solostar Pen Inject 30 Units into the skin 2 (two) times daily. Patient taking differently: Inject 35 Units into the skin 2 (two) times daily.  06/09/17   Jerrol Banana., MD  liraglutide (VICTOZA) 18 MG/3ML SOPN INJECT 0.3ML (=1.8MG ) INTO THE SKIN DAILY 11/02/17   Jerrol Banana., MD  magnesium oxide (MAG-OX) 400 (241.3 Mg) MG tablet TAKE 1 TABLET (400 MG TOTAL) BY MOUTH 2 (TWO) TIMES DAILY. 01/27/17   Jerrol Banana., MD  metFORMIN (GLUCOPHAGE) 1000 MG tablet TAKE 1 TABLET (1,000 MG TOTAL) BY MOUTH 2 (TWO) TIMES DAILY. 07/13/17   Jerrol Banana., MD  metoprolol (LOPRESSOR) 100 MG tablet TAKE 1 TABLET (100 MG TOTAL) BY MOUTH 2 (TWO) TIMES DAILY. 02/21/17   Jerrol Banana., MD  mometasone (ELOCON) 0.1 % cream APPLY 1 APPLICATION TOPICALLY DAILY. FOR 2 WEEKS Patient taking differently: Apply 1 application topically daily. For 2 weeks 08/30/17   Jerrol Banana., MD  Multiple Vitamins-Minerals (CENTRUM SILVER 50+MEN PO) Take by mouth.    [provider]  NON FORMULARY CPAP    [provider]  Omega-3 Fatty Acids (FISH OIL) 1200 MG CAPS Take 1,200 mg by mouth daily.    [provider]  oxyCODONE (OXYCONTIN) 40 mg 12 hr tablet Take 1 tablet (40 mg total) by mouth every 8 (eight) hours as needed. 09/14/17   Jerrol Banana., MD  oxyCODONE (OXYCONTIN) 40 mg 12 hr tablet Take 1 tablet (40 mg total) every 8 (eight) hours as needed by mouth. 11/03/17   Jerrol Banana., MD  pregabalin (LYRICA) 100 MG capsule Take 1 tablet in the morning and 2 tablets in the evening 09/14/17   Jerrol Banana., MD  ranitidine (ZANTAC) 300 MG tablet TAKE 1 TABLET BY MOUTH EVERY DAY 02/28/17   Jerrol Banana., MD  sucralfate (CARAFATE) 1 g  tablet Take 1 tablet (1 g total) by mouth 4 (four) times daily -  with meals and at bedtime. 12/13/17   Jerrol Banana., MD  Testosterone Cypionate (DEPO-TESTOSTERONE IM) Inject into the muscle every 21 ( twenty-one) days.    [provider]    Allergies as of 12/22/2017 - Review Complete 12/13/2017  Allergen Reaction Noted  . Carisoprodol Itching 07/11/2015  Family History  Problem Relation Age of Onset  . Heart attack Brother     Social History   Socioeconomic History  . Marital status: Married    Spouse name: Not on file  . Number of children: Not on file  . Years of education: Not on file  . Highest education level: Not on file  Social Needs  . Financial resource strain: Not on file  . Food insecurity - worry: Not on file  . Food insecurity - inability: Not on file  . Transportation needs - medical: Not on file  . Transportation needs - non-medical: Not on file  Occupational History  . Not on file  Tobacco Use  . Smoking status: Former Smoker    Packs/day: 2.00    Years: 20.00    Pack years: 40.00    Types: Cigarettes    Last attempt to quit: 12/26/1989    Years since quitting: 28.0  . Smokeless tobacco: Never Used  Substance and Sexual Activity  . Alcohol use: No  . Drug use: No  . Sexual activity: Not on file  Other Topics Concern  . Not on file  Social History Narrative  . Not on file    Review of Systems: See HPI, otherwise negative ROS  Physical Exam: BP (!) 163/89   Pulse (!) 123   Temp 98.1 F (36.7 C) (Tympanic)   Resp 18   Ht 5\' 11"  (1.803 m)   Wt 122.5 kg (270 lb)   SpO2 95%   BMI 37.66 kg/m  General:   Alert,  pleasant and cooperative in NAD Head:  Normocephalic and atraumatic. Neck:  Supple; no masses or thyromegaly. Lungs:  Clear throughout to auscultation.    Heart:  Regular rate and rhythm. Abdomen:  Soft, nontender and nondistended. Normal bowel sounds, without guarding, and without rebound.  Somewhat over  weight. Neurologic:  Alert and  oriented x4;  grossly normal neurologically.  Impression/Plan: THEON SOBOTKA is here for an endoscopy and colonoscopy to be performed for [facility] colon polyps and dysphagia.  Risks, benefits, limitations, and alternatives regarding  endoscopy and colonoscopy have been reviewed with the patient.  Questions have been answered.  All parties agreeable.   Gaylyn Cheers, MD  01/16/2018, 7:28 AM

## 2018-01-16 NOTE — Anesthesia Post-op Follow-up Note (Signed)
Anesthesia QCDR form completed.        

## 2018-01-17 ENCOUNTER — Encounter: Payer: Self-pay | Admitting: Unknown Physician Specialty

## 2018-01-17 LAB — SURGICAL PATHOLOGY

## 2018-01-18 ENCOUNTER — Encounter: Payer: Self-pay | Admitting: Family Medicine

## 2018-01-18 ENCOUNTER — Ambulatory Visit (INDEPENDENT_AMBULATORY_CARE_PROVIDER_SITE_OTHER): Payer: Medicare Other | Admitting: Family Medicine

## 2018-01-18 VITALS — BP 162/60 | HR 112 | Temp 98.2°F | Resp 18 | Wt 279.0 lb

## 2018-01-18 DIAGNOSIS — G8929 Other chronic pain: Secondary | ICD-10-CM

## 2018-01-18 DIAGNOSIS — I10 Essential (primary) hypertension: Secondary | ICD-10-CM

## 2018-01-18 DIAGNOSIS — R000 Tachycardia, unspecified: Secondary | ICD-10-CM | POA: Diagnosis not present

## 2018-01-18 DIAGNOSIS — M549 Dorsalgia, unspecified: Secondary | ICD-10-CM | POA: Diagnosis not present

## 2018-01-18 DIAGNOSIS — G629 Polyneuropathy, unspecified: Secondary | ICD-10-CM

## 2018-01-18 MED ORDER — PREGABALIN 100 MG PO CAPS: ORAL_CAPSULE | ORAL | 5 refills | Status: DC

## 2018-01-18 MED ORDER — RANITIDINE HCL 300 MG PO TABS: 300.0000 mg | ORAL_TABLET | Freq: Every day | ORAL | 3 refills | Status: DC

## 2018-01-18 MED ORDER — PREGABALIN 200 MG PO CAPS: ORAL_CAPSULE | ORAL | 1 refills | Status: DC

## 2018-01-18 NOTE — Progress Notes (Signed)
[name]    DOB: 09-15-1951   67 y.o.   MRN: [id number] Visit Date: 01/18/2018  Today's Provider: [name], MD   Chief Complaint  Patient presents with  . Follow-up   Subjective:    HPI Pt was seen on 12/13/17 for nausea and abdominal bloating. Consider Linzess or Amitzia. Checked CBC, MetC, lipase. Stable Recheck BMP due to decreased Kidney function. Abdominal xray showed significant constipation. US Abdomen- normal, fatty liver changes. Start Carafate. Pt had colonoscopy and endo on 01/16/18 Dr. Vira Agar. Showed diverticulosis, hemorrhoids. No biopsies done for colon. Endoscopy they did do a biopsy and awaiting results.  Pt reports that his stomach is feeling better and does not feel so full anymore. However he is tired today but thinks it is from having to get up so early the last couple of days and not going to bed until late. Denies chest pain, shortness of breath     Allergies  Allergen Reactions  . Carisoprodol Itching     Current Outpatient Medications:  .  amiodarone (PACERONE) 200 MG tablet, TAKE 1 TABLET (200 MG TOTAL) BY MOUTH DAILY. (Patient taking differently: Take 200 mg by mouth daily. TAKE 1 TABLET (200 MG TOTAL) BY MOUTH DAILY.), Disp: 180 tablet, Rfl: 3 .  aspirin 81 MG tablet, Take 81 mg by mouth daily., Disp: , Rfl:  .  atorvastatin (LIPITOR) 40 MG tablet, TAKE 1 TABLET (40 MG TOTAL) BY MOUTH DAILY AT 6 PM., Disp: 90 tablet, Rfl: 3 .  budesonide (RHINOCORT AQUA) 32 MCG/ACT nasal spray, Place 1 spray daily as needed into both nostrils (for allergies.)., Disp: 1 Bottle, Rfl: 12 .  CINNAMON PO, Take 1,200 mg by mouth 2 (two) times daily., Disp: , Rfl:  .  cyclobenzaprine (FLEXERIL) 10 MG tablet, Take 10 mg by mouth 3 (three) times daily as needed for muscle spasms., Disp: , Rfl:  .  dutasteride (AVODART) 0.5 MG capsule, Take 1 capsule (0.5 mg total) by mouth daily., Disp: 90 capsule, Rfl: 3 .  esomeprazole (NEXIUM) 40 MG capsule,  TAKE 1 CAPSULE TWICE DAILY, Disp: 90 capsule, Rfl: 3 .  fexofenadine (ALLEGRA) 180 MG tablet, Take 180 mg by mouth daily.  , Disp: , Rfl:  .  furosemide (LASIX) 40 MG tablet, Take 1 tablet (40 mg total) by mouth daily., Disp: 30 tablet, Rfl: 11 .  gabapentin (NEURONTIN) 800 MG tablet, Take 800 mg by mouth 3 (three) times daily., Disp: , Rfl:  .  glucose blood (ONE TOUCH ULTRA TEST) test strip, CHECK SUGAR TWICE DAILY. DX- E11.8, Disp: 100 each, Rfl: 3 .  hydrochlorothiazide (HYDRODIURIL) 12.5 MG tablet, TAKE 2 TABLETS (25 MG TOTAL) BY MOUTH AT BEDTIME., Disp: 90 tablet, Rfl: 3 .  insulin aspart (NOVOLOG FLEXPEN) 100 UNIT/ML FlexPen, INJECT 14 UNITS 3 TIMES A   DAY (MORNING, NOON, & NIGHT), Disp: 15 pen, Rfl: 3 .  Insulin Pen Needle (PEN NEEDLES) 31G X 5 MM MISC, 1 each by Does not apply route daily. PATIENT NEEDS NOVA TWIST NEEDLES 5 MM  DX E11.9, Disp: 200 each, Rfl: 3 .  JANUVIA 100 MG tablet, TAKE 1 TABLET DAILY, Disp: 90 tablet, Rfl: 3 .  LANTUS SOLOSTAR 100 UNIT/ML Solostar Pen, Inject 30 Units into the skin 2 (two) times daily. (Patient taking differently: Inject 35 Units into the skin 2 (two) times daily. ), Disp: 15 mL, Rfl: 3 .  liraglutide (VICTOZA) 18 MG/3ML SOPN, INJECT 0.3ML (=1.8MG ) INTO THE  SKIN DAILY, Disp: 27 mL, Rfl: 3 .  magnesium oxide (MAG-OX) 400 (241.3 Mg) MG tablet, TAKE 1 TABLET (400 MG TOTAL) BY MOUTH 2 (TWO) TIMES DAILY., Disp: 180 tablet, Rfl: 3 .  metFORMIN (GLUCOPHAGE) 1000 MG tablet, TAKE 1 TABLET (1,000 MG TOTAL) BY MOUTH 2 (TWO) TIMES DAILY., Disp: 60 tablet, Rfl: 11 .  metoprolol (LOPRESSOR) 100 MG tablet, TAKE 1 TABLET (100 MG TOTAL) BY MOUTH 2 (TWO) TIMES DAILY., Disp: 60 tablet, Rfl: 11 .  mometasone (ELOCON) 0.1 % cream, APPLY 1 APPLICATION TOPICALLY DAILY. FOR 2 WEEKS (Patient taking differently: Apply 1 application topically daily. For 2 weeks), Disp: 45 g, Rfl: 2 .  Multiple Vitamins-Minerals (CENTRUM SILVER 50+MEN PO), Take by mouth., Disp: , Rfl:  .  NON  FORMULARY, CPAP, Disp: , Rfl:  .  Omega-3 Fatty Acids (FISH OIL) 1200 MG CAPS, Take 1,200 mg by mouth daily., Disp: , Rfl:  .  oxyCODONE (OXYCONTIN) 40 mg 12 hr tablet, Take 1 tablet (40 mg total) by mouth every 8 (eight) hours as needed., Disp: 270 tablet, Rfl: 0 .  oxyCODONE (OXYCONTIN) 40 mg 12 hr tablet, Take 1 tablet (40 mg total) every 8 (eight) hours as needed by mouth., Disp: 270 tablet, Rfl: 0 .  pregabalin (LYRICA) 100 MG capsule, Take 1 tablet in the morning and 2 tablets in the evening, Disp: 90 capsule, Rfl: 5 .  ranitidine (ZANTAC) 300 MG tablet, TAKE 1 TABLET BY MOUTH EVERY DAY, Disp: 90 tablet, Rfl: 3 .  sucralfate (CARAFATE) 1 g tablet, Take 1 tablet (1 g total) by mouth 4 (four) times daily -  with meals and at bedtime., Disp: 120 tablet, Rfl: 1 .  Testosterone Cypionate (DEPO-TESTOSTERONE IM), Inject into the muscle every 21 ( twenty-one) days., Disp: , Rfl:  .  vitamin B-12 (CYANOCOBALAMIN) 1000 MCG tablet, Take 1,000 mcg by mouth daily., Disp: , Rfl:   Review of Systems  Constitutional: Positive for fatigue.  HENT: Negative.   Eyes: Negative.   Respiratory: Negative.   Cardiovascular: Negative.   Gastrointestinal: Negative.   Endocrine: Negative.   Genitourinary: Negative.   Musculoskeletal: Negative.   Skin: Negative.   Allergic/Immunologic: Negative.   Neurological: Negative.   Hematological: Negative.   Psychiatric/Behavioral: Negative.     Social History   Tobacco Use  . Smoking status: Former Smoker    Packs/day: 2.00    Years: 20.00    Pack years: 40.00    Types: Cigarettes    Last attempt to quit: 12/26/1989    Years since quitting: 28.0  . Smokeless tobacco: Never Used  Substance Use Topics  . Alcohol use: No   Objective:   BP (!) 162/60 (BP Location: Left Arm, Patient Position: Sitting, Cuff Size: Large)   Pulse (!) 112   Temp 98.2 F (36.8 C) (Oral)   Resp 18   Wt 279 lb (126.6 kg)   BMI 38.91 kg/m  Vitals:   01/18/18 1046  BP: (!)  162/60  Pulse: (!) 112  Resp: 18  Temp: 98.2 F (36.8 C)  TempSrc: Oral  Weight: 279 lb (126.6 kg)     Physical Exam  Constitutional: He is oriented to person, place, and time. He appears well-developed and well-nourished.  HENT:  Head: Normocephalic and atraumatic.  Right Ear: External ear normal.  Left Ear: External ear normal.  Nose: Nose normal.  Eyes: Conjunctivae are normal. No scleral icterus.  Neck: No thyromegaly present.  Cardiovascular: Normal rate, regular rhythm and normal heart sounds.  Pulmonary/Chest: Effort normal and breath sounds normal.  Abdominal: Soft.  Neurological: He is alert and oriented to person, place, and time.  Skin: Skin is warm and dry.  Psychiatric: He has a normal mood and affect. His behavior is normal. Judgment and thought content normal.        Assessment & Plan:     HYPERTENSION, BENIGN  Neuropathy - Plan: pregabalin (LYRICA) 200 MG capsule, DISCONTINUED: pregabalin (LYRICA) 100 MG capsule  Chronic back pain, unspecified back location, unspecified back pain laterality - Plan: pregabalin (LYRICA) 200 MG capsule, DISCONTINUED: pregabalin (LYRICA) 100 MG capsule  Tachycardia - Plan: EKG 12-Lead med I have reviewed the health advisors note, was  available for consultation and I agree with documentation and plan. Miguel Aschoff MD [place], MD  [facility]

## 2018-02-12 ENCOUNTER — Other Ambulatory Visit: Payer: Self-pay | Admitting: Family Medicine

## 2018-02-12 DIAGNOSIS — R110 Nausea: Secondary | ICD-10-CM

## 2018-02-19 ENCOUNTER — Other Ambulatory Visit: Payer: Self-pay | Admitting: Family Medicine

## 2018-02-20 ENCOUNTER — Ambulatory Visit: Payer: Self-pay | Admitting: Family Medicine

## 2018-02-22 ENCOUNTER — Ambulatory Visit (INDEPENDENT_AMBULATORY_CARE_PROVIDER_SITE_OTHER): Payer: Medicare Other | Admitting: Family Medicine

## 2018-02-22 ENCOUNTER — Encounter: Payer: Self-pay | Admitting: Family Medicine

## 2018-02-22 VITALS — BP 122/70 | HR 84 | Temp 97.9°F | Resp 16 | Wt 281.0 lb

## 2018-02-22 DIAGNOSIS — G8929 Other chronic pain: Secondary | ICD-10-CM

## 2018-02-22 DIAGNOSIS — J189 Pneumonia, unspecified organism: Secondary | ICD-10-CM | POA: Diagnosis not present

## 2018-02-22 DIAGNOSIS — M5441 Lumbago with sciatica, right side: Secondary | ICD-10-CM | POA: Diagnosis not present

## 2018-02-22 DIAGNOSIS — I480 Paroxysmal atrial fibrillation: Secondary | ICD-10-CM | POA: Diagnosis not present

## 2018-02-22 DIAGNOSIS — A419 Sepsis, unspecified organism: Secondary | ICD-10-CM | POA: Diagnosis not present

## 2018-02-22 DIAGNOSIS — IMO0002 Reserved for concepts with insufficient information to code with codable children: Secondary | ICD-10-CM

## 2018-02-22 DIAGNOSIS — M5442 Lumbago with sciatica, left side: Secondary | ICD-10-CM | POA: Diagnosis not present

## 2018-02-22 DIAGNOSIS — E118 Type 2 diabetes mellitus with unspecified complications: Secondary | ICD-10-CM

## 2018-02-22 DIAGNOSIS — E1165 Type 2 diabetes mellitus with hyperglycemia: Secondary | ICD-10-CM

## 2018-02-22 NOTE — Progress Notes (Signed)
[name]    DOB: Dec 26, 1951   67 y.o.   MRN: [id number] Visit Date: 02/22/2018  Today's Provider: [name], MD   Chief Complaint  Patient presents with  . Hypertension  . Abdominal Pain   Subjective:    HPI   Pt presents for a follow up. He last seen about 4 weeks ago. His BP at that time was 162/60, and he tachycardic (HR was 112). No med changes were made.   He is still c/o abdominal pain and constipation. He is taking Mylanta at night, and a stool softener, without relief. He states he goes about 4 days without a BM.  He states his main complaint is neuropathy pain in his feet and ankles. The pain keeps him awake at night. He is taking Gabapentin and Lyrica.   Allergies  Allergen Reactions  . Carisoprodol Itching     Current Outpatient Medications:  .  amiodarone (PACERONE) 200 MG tablet, TAKE 1 TABLET (200 MG TOTAL) BY MOUTH DAILY. (Patient taking differently: Take 200 mg by mouth daily. TAKE 1 TABLET (200 MG TOTAL) BY MOUTH DAILY.), Disp: 180 tablet, Rfl: 3 .  aspirin 81 MG tablet, Take 81 mg by mouth daily., Disp: , Rfl:  .  atorvastatin (LIPITOR) 40 MG tablet, TAKE 1 TABLET (40 MG TOTAL) BY MOUTH DAILY AT 6 PM., Disp: 90 tablet, Rfl: 3 .  budesonide (RHINOCORT AQUA) 32 MCG/ACT nasal spray, Place 1 spray daily as needed into both nostrils (for allergies.)., Disp: 1 Bottle, Rfl: 12 .  CINNAMON PO, Take 1,200 mg by mouth 2 (two) times daily., Disp: , Rfl:  .  dutasteride (AVODART) 0.5 MG capsule, Take 1 capsule (0.5 mg total) by mouth daily., Disp: 90 capsule, Rfl: 3 .  esomeprazole (NEXIUM) 40 MG capsule, TAKE 1 CAPSULE TWICE DAILY, Disp: 90 capsule, Rfl: 3 .  fexofenadine (ALLEGRA) 180 MG tablet, Take 180 mg by mouth daily.  , Disp: , Rfl:  .  furosemide (LASIX) 40 MG tablet, Take 1 tablet (40 mg total) by mouth daily., Disp: 30 tablet, Rfl: 11 .  gabapentin (NEURONTIN) 800 MG tablet, Take 800 mg by mouth 3 (three) times daily., Disp: ,  Rfl:  .  glucose blood (ONE TOUCH ULTRA TEST) test strip, CHECK SUGAR TWICE DAILY. DX- E11.8, Disp: 100 each, Rfl: 3 .  hydrochlorothiazide (HYDRODIURIL) 12.5 MG tablet, TAKE 2 TABLETS (25 MG TOTAL) BY MOUTH AT BEDTIME., Disp: 90 tablet, Rfl: 3 .  insulin aspart (NOVOLOG FLEXPEN) 100 UNIT/ML FlexPen, INJECT 14 UNITS 3 TIMES A   DAY (MORNING, NOON, & NIGHT), Disp: 15 pen, Rfl: 3 .  Insulin Pen Needle (PEN NEEDLES) 31G X 5 MM MISC, 1 each by Does not apply route daily. PATIENT NEEDS NOVA TWIST NEEDLES 5 MM  DX E11.9, Disp: 200 each, Rfl: 3 .  JANUVIA 100 MG tablet, TAKE 1 TABLET DAILY, Disp: 90 tablet, Rfl: 3 .  LANTUS SOLOSTAR 100 UNIT/ML Solostar Pen, Inject 30 Units into the skin 2 (two) times daily. (Patient taking differently: Inject 35 Units into the skin 2 (two) times daily. ), Disp: 15 mL, Rfl: 3 .  liraglutide (VICTOZA) 18 MG/3ML SOPN, INJECT 0.3ML (=1.8MG ) INTO THE SKIN DAILY, Disp: 27 mL, Rfl: 3 .  magnesium oxide (MAG-OX) 400 (241.3 Mg) MG tablet, TAKE 1 TABLET (400 MG TOTAL) BY MOUTH 2 (TWO) TIMES DAILY., Disp: 180 tablet, Rfl: 3 .  metFORMIN (GLUCOPHAGE) 1000 MG tablet, TAKE 1 TABLET (1,000 MG  TOTAL) BY MOUTH 2 (TWO) TIMES DAILY., Disp: 60 tablet, Rfl: 11 .  metoprolol tartrate (LOPRESSOR) 100 MG tablet, TAKE 1 TABLET (100 MG TOTAL) BY MOUTH 2 (TWO) TIMES DAILY., Disp: 60 tablet, Rfl: 10 .  NON FORMULARY, CPAP, Disp: , Rfl:  .  Omega-3 Fatty Acids (FISH OIL) 1200 MG CAPS, Take 1,200 mg by mouth daily., Disp: , Rfl:  .  oxyCODONE (OXYCONTIN) 40 mg 12 hr tablet, Take 1 tablet (40 mg total) by mouth every 8 (eight) hours as needed., Disp: 270 tablet, Rfl: 0 .  oxyCODONE (OXYCONTIN) 40 mg 12 hr tablet, Take 1 tablet (40 mg total) every 8 (eight) hours as needed by mouth., Disp: 270 tablet, Rfl: 0 .  pregabalin (LYRICA) 200 MG capsule, Three times a day, Disp: 270 capsule, Rfl: 1 .  ranitidine (ZANTAC) 300 MG tablet, Take 1 tablet (300 mg total) by mouth daily., Disp: 90 tablet, Rfl: 3 .   sucralfate (CARAFATE) 1 g tablet, TAKE 1 TABLET (1 G TOTAL) BY MOUTH 4 (FOUR) TIMES DAILY - WITH MEALS AND AT BEDTIME., Disp: 120 tablet, Rfl: 1 .  Testosterone Cypionate (DEPO-TESTOSTERONE IM), Inject into the muscle every 21 ( twenty-one) days., Disp: , Rfl:   Review of Systems  Constitutional: Positive for unexpected weight change (gain). Negative for activity change, appetite change, chills, diaphoresis, fatigue and fever.  Respiratory: Negative for shortness of breath.   Cardiovascular: Positive for leg swelling. Negative for chest pain and palpitations.  Gastrointestinal: Positive for abdominal pain and constipation.  Endocrine: Negative.   Musculoskeletal: Positive for myalgias.  Allergic/Immunologic: Negative.   Psychiatric/Behavioral: Negative.     Social History   Tobacco Use  . Smoking status: Former Smoker    Packs/day: 2.00    Years: 20.00    Pack years: 40.00    Types: Cigarettes    Last attempt to quit: 12/26/1989    Years since quitting: 28.1  . Smokeless tobacco: Never Used  Substance Use Topics  . Alcohol use: No   Objective:   BP 122/70 (BP Location: Right Arm, Patient Position: Sitting, Cuff Size: Large)   Pulse 84   Temp 97.9 F (36.6 C) (Oral)   Resp 16   Wt 281 lb (127.5 kg)   BMI 39.19 kg/m  Vitals:   02/22/18 1558  BP: 122/70  Pulse: 84  Resp: 16  Temp: 97.9 F (36.6 C)  TempSrc: Oral  Weight: 281 lb (127.5 kg)     Physical Exam  Constitutional: He is oriented to person, place, and time. He appears well-developed and well-nourished.  HENT:  Head: Normocephalic and atraumatic.  Eyes: Conjunctivae are normal. No scleral icterus.  Neck: No thyromegaly present.  Cardiovascular: Normal rate, regular rhythm and normal heart sounds.  Pulmonary/Chest: Effort normal and breath sounds normal.  Abdominal: Soft.  Musculoskeletal: He exhibits edema.  1+ LE edema  Neurological: He is alert and oriented to person, place, and time.  Skin: Skin is  warm and dry.  Psychiatric: He has a normal mood and affect. His behavior is normal. Judgment and thought content normal.        Assessment & Plan:     HTN Improved. TIIDM Chronic LS Radiculopathy Increase Lyrica to 150 mg TID.On Oxycontin OA   I have done the exam and reviewed the chart and it is accurate to the best of my knowledge. Development worker, community has been used and  any errors in dictation or transcription are unintentional. Miguel Aschoff M.D. [facility]        [name], MD  [facility]

## 2018-02-23 ENCOUNTER — Other Ambulatory Visit: Payer: Self-pay | Admitting: Family Medicine

## 2018-02-23 DIAGNOSIS — G8929 Other chronic pain: Secondary | ICD-10-CM

## 2018-02-23 DIAGNOSIS — M549 Dorsalgia, unspecified: Principal | ICD-10-CM

## 2018-02-23 MED ORDER — OXYCODONE HCL ER 40 MG PO T12A: 40.0000 mg | EXTENDED_RELEASE_TABLET | Freq: Three times a day (TID) | ORAL | 0 refills | Status: DC | PRN

## 2018-02-23 NOTE — Telephone Encounter (Signed)
Please advise. Thanks.  

## 2018-02-23 NOTE — Telephone Encounter (Signed)
Patient needs refills on oxycodone 40 mg.  Please call Pt. when ready.

## 2018-02-23 NOTE — Telephone Encounter (Signed)
rx up front for pt to pick up. Pt advised.

## 2018-02-28 DIAGNOSIS — M4800 Spinal stenosis, site unspecified: Secondary | ICD-10-CM | POA: Diagnosis not present

## 2018-02-28 DIAGNOSIS — Z6838 Body mass index (BMI) 38.0-38.9, adult: Secondary | ICD-10-CM | POA: Diagnosis not present

## 2018-02-28 DIAGNOSIS — M4716 Other spondylosis with myelopathy, lumbar region: Secondary | ICD-10-CM | POA: Diagnosis not present

## 2018-02-28 DIAGNOSIS — I739 Peripheral vascular disease, unspecified: Secondary | ICD-10-CM | POA: Diagnosis not present

## 2018-03-01 DIAGNOSIS — E291 Testicular hypofunction: Secondary | ICD-10-CM | POA: Diagnosis not present

## 2018-03-03 ENCOUNTER — Ambulatory Visit (INDEPENDENT_AMBULATORY_CARE_PROVIDER_SITE_OTHER): Payer: Medicare Other | Admitting: Vascular Surgery

## 2018-03-03 ENCOUNTER — Other Ambulatory Visit (INDEPENDENT_AMBULATORY_CARE_PROVIDER_SITE_OTHER): Payer: Self-pay | Admitting: Neurosurgery

## 2018-03-03 ENCOUNTER — Ambulatory Visit (INDEPENDENT_AMBULATORY_CARE_PROVIDER_SITE_OTHER): Payer: Medicare Other

## 2018-03-03 ENCOUNTER — Encounter (INDEPENDENT_AMBULATORY_CARE_PROVIDER_SITE_OTHER): Payer: Self-pay | Admitting: Vascular Surgery

## 2018-03-03 VITALS — BP 136/63 | HR 75 | Resp 17 | Ht 71.0 in | Wt 277.2 lb

## 2018-03-03 DIAGNOSIS — IMO0002 Reserved for concepts with insufficient information to code with codable children: Secondary | ICD-10-CM

## 2018-03-03 DIAGNOSIS — I10 Essential (primary) hypertension: Secondary | ICD-10-CM | POA: Diagnosis not present

## 2018-03-03 DIAGNOSIS — I739 Peripheral vascular disease, unspecified: Secondary | ICD-10-CM

## 2018-03-03 DIAGNOSIS — M79604 Pain in right leg: Secondary | ICD-10-CM | POA: Diagnosis not present

## 2018-03-03 DIAGNOSIS — E1165 Type 2 diabetes mellitus with hyperglycemia: Secondary | ICD-10-CM

## 2018-03-03 DIAGNOSIS — M79605 Pain in left leg: Secondary | ICD-10-CM | POA: Diagnosis not present

## 2018-03-03 DIAGNOSIS — E118 Type 2 diabetes mellitus with unspecified complications: Secondary | ICD-10-CM

## 2018-03-03 DIAGNOSIS — M79609 Pain in unspecified limb: Secondary | ICD-10-CM | POA: Insufficient documentation

## 2018-03-03 DIAGNOSIS — E782 Mixed hyperlipidemia: Secondary | ICD-10-CM

## 2018-03-03 NOTE — Assessment & Plan Note (Signed)
Diabetic neuropathy is likely playing a significant role in his lower extremity symptoms and blood glucose control important in reducing the progression of atherosclerotic disease. Also, involved in wound healing. On appropriate medications.

## 2018-03-03 NOTE — Assessment & Plan Note (Signed)
Noninvasive studies today show normal ABIs of 1.2 bilaterally with brisk triphasic waveforms and normal digital pressures consistent with no significant arterial insufficiency of either lower extremity. It does not appears if arterial insufficiency is playing a role in his lower extremity symptoms.  I suspect neuropathic pain is the primary issue.  I will see him back as needed.

## 2018-03-03 NOTE — Assessment & Plan Note (Signed)
blood pressure control important in reducing the progression of atherosclerotic disease. On appropriate oral medications.  

## 2018-03-03 NOTE — Progress Notes (Signed)
Patient ID: Jerry Parsons, male   DOB: 30-Apr-1951, 67 y.o.   MRN: [id number]  Chief Complaint  Patient presents with  . New Patient (Initial Visit)    ref Carloyn Manner for le ven reflux    HPI Jerry Parsons is a 67 y.o. male.  I am asked to see the patient by Dr. Carloyn Manner for evaluation of LE pain.  The patient reports pain around the knee and calf area on the right and in the foot on the left.  This is worse with activity.  This does not reliably relieved with rest.  No history of ulceration or infection.  Long-standing diabetes mellitus with some known degree of neuropathy, but concern for arterial insufficiency as a contributing factor was present.  Noninvasive studies today show normal ABIs of 1.2 bilaterally with brisk triphasic waveforms and normal digital pressures consistent with no significant arterial insufficiency of either lower extremity.   Past Medical History:  Diagnosis Date  . Arrhythmia    tachycardia, A-Fib  . Arthritis   . Blind right eye   . BPH (benign prostatic hyperplasia)   . Diabetes mellitus   . DVT (deep venous thrombosis) ([place]) 02/2010   leg thrombus ; dislodged into emboli and caused PE  . Dyspnea   . Dysrhythmia   . 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 GERD (gastroesophageal reflux disease)   . Hypercholesteremia   . Hypertension   . Kidney failure   . Neuromuscular disorder ([place])   . Neuropathy   . Pneumonia    time 9 ;last episode 12/2015  . Pulmonary embolus ([place]) 2011  . Seasonal allergies   . Seizures ([place])    as child   . Sleep apnea    BIPAP  . Stiff neck    limited turning s/p titanium plate placement  . Stroke ([place])   . TIA (transient ischemic attack)   . Wears dentures    full upper and lower    Past Surgical History:  Procedure Laterality Date  . APPENDECTOMY    . BACK SURGERY     x 8; upper x 3 & lower x 5  . CARDIOVERSION  03/14/13, 10/16   2014 - [place], 2016 - [place]  . CATARACT EXTRACTION W/PHACO Left 10/29/2015   Procedure: CATARACT EXTRACTION PHACO AND INTRAOCULAR LENS PLACEMENT (IOC);  Surgeon: Leandrew Koyanagi, MD;  Location: [facility];  Service: Ophthalmology;  Laterality: Left;  DIABETIC - insulin and oral medsSleep apnea - no machine  . CHOLECYSTECTOMY    . COLONOSCOPY WITH PROPOFOL N/A 01/16/2018   Procedure: COLONOSCOPY WITH PROPOFOL;  Surgeon: Manya Silvas, MD;  Location: [facility] ENDOSCOPY;  Service: Endoscopy;  Laterality: N/A;  . ESOPHAGOGASTRODUODENOSCOPY (EGD) WITH PROPOFOL N/A 01/16/2018   Procedure: ESOPHAGOGASTRODUODENOSCOPY (EGD) WITH PROPOFOL;  Surgeon: Manya Silvas, MD;  Location: [facility] ENDOSCOPY;  Service: Endoscopy;  Laterality: N/A;  . EYE SURGERY    . GALLBLADDER SURGERY  2002  . JOINT REPLACEMENT Right 2018  . KNEE ARTHROSCOPY     left   . ROTATOR CUFF REPAIR  2001   left   . TONSILLECTOMY    . TOTAL KNEE ARTHROPLASTY Right 06/20/2017   Procedure: RIGHT TOTAL KNEE ARTHROPLASTY;  Surgeon: Gaynelle Arabian, MD;  Location: [facility];  Service: Orthopedics;  Laterality: Right;    Family History  Problem Relation Age of Onset  . Heart attack Brother      Social History Social History   Tobacco Use  .  Smoking status: Former Smoker    Packs/day: 2.00    Years: 20.00    Pack years: 40.00    Types: Cigarettes    Last attempt to quit: 12/26/1989    Years since quitting: 28.2  . Smokeless tobacco: Never Used  Substance Use Topics  . Alcohol use: No  . Drug use: No     Allergies  Allergen Reactions  . Carisoprodol Itching    Current Outpatient Medications  Medication Sig Dispense Refill  . amiodarone (PACERONE) 200 MG tablet TAKE 1 TABLET (200 MG TOTAL) BY MOUTH DAILY. (Patient taking differently: Take 200 mg by mouth daily. TAKE 1 TABLET (200 MG TOTAL) BY MOUTH DAILY.) 180 tablet 3  . aspirin 81 MG tablet Take 81 mg by mouth daily.    . cyclobenzaprine (FLEXERIL) 10 MG tablet Take 10 mg by mouth 3 (three) times daily as needed for muscle spasms.    [name]  dutasteride (AVODART) 0.5 MG capsule Take 1 capsule (0.5 mg total) by mouth daily. 90 capsule 3  . esomeprazole (NEXIUM) 40 MG capsule TAKE 1 CAPSULE TWICE DAILY 90 capsule 3  . fexofenadine (ALLEGRA) 180 MG tablet Take 180 mg by mouth daily.      . furosemide (LASIX) 40 MG tablet Take 1 tablet (40 mg total) by mouth daily. 30 tablet 11  . glucose blood (ONE TOUCH ULTRA TEST) test strip CHECK SUGAR TWICE DAILY. DX- E11.8 100 each 3  . hydrochlorothiazide (HYDRODIURIL) 12.5 MG tablet TAKE 2 TABLETS (25 MG TOTAL) BY MOUTH AT BEDTIME. 90 tablet 3  . insulin aspart (NOVOLOG FLEXPEN) 100 UNIT/ML FlexPen INJECT 14 UNITS 3 TIMES A   DAY (MORNING, NOON, & NIGHT) 15 pen 3  . Insulin Pen Needle (PEN NEEDLES) 31G X 5 MM MISC 1 each by Does not apply route daily. PATIENT NEEDS NOVA TWIST NEEDLES 5 MM  DX E11.9 200 each 3  . JANUVIA 100 MG tablet TAKE 1 TABLET DAILY 90 tablet 3  . LANTUS SOLOSTAR 100 UNIT/ML Solostar Pen Inject 30 Units into the skin 2 (two) times daily. 15 mL 3  . liraglutide (VICTOZA) 18 MG/3ML SOPN INJECT 0.3ML (=1.8MG ) INTO THE SKIN DAILY 27 mL 3  . magnesium oxide (MAG-OX) 400 (241.3 Mg) MG tablet TAKE 1 TABLET (400 MG TOTAL) BY MOUTH 2 (TWO) TIMES DAILY. 180 tablet 3  . metoprolol tartrate (LOPRESSOR) 100 MG tablet TAKE 1 TABLET (100 MG TOTAL) BY MOUTH 2 (TWO) TIMES DAILY. 60 tablet 10  . Multiple Vitamins-Minerals (CENTRUM SILVER PO) Take by mouth daily.    . NON FORMULARY CPAP    . Omega-3 Fatty Acids (FISH OIL) 1200 MG CAPS Take 1,200 mg by mouth daily.    [name] oxyCODONE (OXYCONTIN) 40 mg 12 hr tablet Take 1 tablet (40 mg total) by mouth every 8 (eight) hours as needed. 270 tablet 0  . pregabalin (LYRICA) 200 MG capsule Three times a day 270 capsule 1  . ranitidine (ZANTAC) 300 MG tablet Take 1 tablet (300 mg total) by mouth daily. 90 tablet 3  . sucralfate (CARAFATE) 1 g tablet TAKE 1 TABLET (1 G TOTAL) BY MOUTH 4 (FOUR) TIMES DAILY - WITH MEALS AND AT BEDTIME. 120 tablet 1  .  Testosterone Cypionate (DEPO-TESTOSTERONE IM) Inject into the muscle every 21 ( twenty-one) days.    [name] atorvastatin (LIPITOR) 40 MG tablet TAKE 1 TABLET (40 MG TOTAL) BY MOUTH DAILY AT 6 PM. (Patient not taking: Reported on 03/03/2018) 90 tablet 3  . budesonide ([place])  32 MCG/ACT nasal spray Place 1 spray daily as needed into both nostrils (for allergies.). (Patient not taking: Reported on 03/03/2018) 1 Bottle 12  . CINNAMON PO Take 1,200 mg by mouth 2 (two) times daily.    [name] gabapentin (NEURONTIN) 800 MG tablet Take 800 mg by mouth 3 (three) times daily.    . metFORMIN (GLUCOPHAGE) 1000 MG tablet TAKE 1 TABLET (1,000 MG TOTAL) BY MOUTH 2 (TWO) TIMES DAILY. (Patient not taking: Reported on 03/03/2018) 60 tablet 11  . oxyCODONE (OXYCONTIN) 40 mg 12 hr tablet Take 1 tablet (40 mg total) by mouth every 8 (eight) hours as needed. (Patient not taking: Reported on 03/03/2018) 270 tablet 0   No current facility-administered medications for this visit.       REVIEW OF SYSTEMS (Negative unless checked)  Constitutional: [] Weight loss  [] Fever  [] Chills Cardiac: [] Chest pain   [] Chest pressure   [x] Palpitations   [] Shortness of breath when laying flat   [] Shortness of breath at rest   [x] Shortness of breath with exertion. Vascular:  [] Pain in legs with walking   [] Pain in legs at rest   [] Pain in legs when laying flat   [] Claudication   [] Pain in feet when walking  [] Pain in feet at rest  [] Pain in feet when laying flat   [] History of DVT   [] Phlebitis   [x] Swelling in legs   [] Varicose veins   [] Non-healing ulcers Pulmonary:   [] Uses home oxygen   [] Productive cough   [] Hemoptysis   [] Wheeze  [] COPD   [] Asthma Neurologic:  [] Dizziness  [] Blackouts   [] Seizures   [x] History of stroke   [] History of TIA  [] Aphasia   [] Temporary blindness   [] Dysphagia   [] Weakness or numbness in arms   [x] Weakness or numbness in legs Musculoskeletal:  [x] Arthritis   [] Joint swelling   [x] Joint pain   [x] Low back  pain Hematologic:  [] Easy bruising  [] Easy bleeding   [] Hypercoagulable state   [] Anemic  [] Hepatitis Gastrointestinal:  [] Blood in stool   [] Vomiting blood  [x] Gastroesophageal reflux/heartburn   [] Abdominal pain Genitourinary:  [x] Chronic kidney disease   [] Difficult urination  [] Frequent urination  [] Burning with urination   [] Hematuria Skin:  [] Rashes   [] Ulcers   [] Wounds Psychological:  [] History of anxiety   []  History of major depression.    Physical Exam BP 136/63 (BP Location: Right Arm)   Pulse 75   Resp 17   Ht 5\' 11"  (1.803 m)   Wt 277 lb 3.2 oz (125.7 kg)   BMI 38.66 kg/m  Gen:  WD/WN, NAD Head: [place]/AT, No temporalis wasting.  Ear/Nose/Throat: Hearing grossly intact, nares w/o erythema or drainage, oropharynx w/o Erythema/Exudate Eyes: Conjunctiva clear, sclera non-icteric  Neck: trachea midline.  No JVD.  Pulmonary:  Good air movement, clear to auscultation bilaterally.  Cardiac: RRR, no JVD Vascular:  Vessel Right Left  Radial Palpable Palpable                          PT  1+ palpable  1+ palpable  DP Palpable Palpable   Musculoskeletal: M/S 5/5 throughout.  Extremities without ischemic changes.  No deformity or atrophy.  Trace lower extremity edema. Neurologic: Sensation grossly intact in extremities.  Symmetrical.  Speech is fluent. Motor exam as listed above. Psychiatric: Judgment intact, Mood & affect appropriate for pt's clinical situation. Dermatologic: No rashes or ulcers noted.  No cellulitis or open wounds.  Radiology No results found.  Labs Recent Results (  from the past 2160 hour(s))  CBC with Differential/Platelet     Status: Abnormal   Collection Time: 12/13/17 11:34 AM  Result Value Ref Range   WBC 7.4 3.8 - 10.8 Thousand/uL   RBC 5.23 4.20 - 5.80 Million/uL   Hemoglobin 12.9 (L) 13.2 - 17.1 g/dL   HCT 40.7 38.5 - 50.0 %   MCV 77.8 (L) 80.0 - 100.0 fL   MCH 24.7 (L) 27.0 - 33.0 pg   MCHC 31.7 (L) 32.0 - 36.0 g/dL   RDW 15.8 (H) 11.0  - 15.0 %   Platelets 165 140 - 400 Thousand/uL   MPV 10.3 7.5 - 12.5 fL   Neutro Abs 4,995 1,500 - 7,800 cells/uL   Lymphs Abs 1,702 850 - 3,900 cells/uL   WBC mixed population 511 200 - 950 cells/uL   Eosinophils Absolute 170 15 - 500 cells/uL   Basophils Absolute 22 0 - 200 cells/uL   Neutrophils Relative % 67.5 %   Total Lymphocyte 23.0 %   Monocytes Relative 6.9 %   Eosinophils Relative 2.3 %   Basophils Relative 0.3 %  Lipase     Status: None   Collection Time: 12/13/17 11:34 AM  Result Value Ref Range   Lipase 30 7 - 60 U/L  COMPLETE METABOLIC PANEL WITH GFR     Status: Abnormal   Collection Time: 12/13/17 11:34 AM  Result Value Ref Range   Glucose, Bld 196 (H) 65 - 99 mg/dL    Comment: .            Fasting reference interval . For someone without known diabetes, a glucose value >125 mg/dL indicates that they may have diabetes and this should be confirmed with a follow-up test. .    BUN 26 (H) 7 - 25 mg/dL   Creat 1.39 (H) 0.70 - 1.25 mg/dL    Comment: For patients >106 years of age, the reference limit for Creatinine is approximately 13% higher for people identified as African-American. .    GFR, Est Non African American 52 (L) > OR = 60 mL/min/1.55m2   GFR, Est African American 60 > OR = 60 mL/min/1.100m2   BUN/Creatinine Ratio 19 6 - 22 (calc)   Sodium 139 135 - 146 mmol/L   Potassium 4.3 3.5 - 5.3 mmol/L   Chloride 98 98 - 110 mmol/L   CO2 32 20 - 32 mmol/L   Calcium 10.1 8.6 - 10.3 mg/dL   Total Protein 7.3 6.1 - 8.1 g/dL   Albumin 4.3 3.6 - 5.1 g/dL   Globulin 3.0 1.9 - 3.7 g/dL (calc)   AG Ratio 1.4 1.0 - 2.5 (calc)   Total Bilirubin 0.4 0.2 - 1.2 mg/dL   Alkaline phosphatase (APISO) 97 40 - 115 U/L   AST 30 10 - 35 U/L   ALT 37 9 - 46 U/L  Glucose, capillary     Status: Abnormal   Collection Time: 01/16/18  7:16 AM  Result Value Ref Range   Glucose-Capillary 278 (H) 65 - 99 mg/dL  Surgical pathology     Status: None   Collection Time: 01/16/18   7:47 AM  Result Value Ref Range   SURGICAL PATHOLOGY      Surgical Pathology CASE: [id number] PATIENT: Chrissie Noa Surgical Pathology Report     SPECIMEN SUBMITTED: A. Stomach, antrum and body; cbx  CLINICAL HISTORY: None provided  PRE-OPERATIVE DIAGNOSIS: HX of colon polyps, dysphagia  POST-OPERATIVE DIAGNOSIS: Gastritis, dysphagia, diverticulosis, internal hemorrhoids     DIAGNOSIS: A.  STOMACH, ANTRUM AND BODY; COLD BIOPSY: - CHRONIC GASTRITIS WITH MARKED FOVEOLAR HYPERPLASIA AND EDEMA. - NEGATIVE FOR ACTIVE INFLAMMATION, INTESTINAL METAPLASIA, DYSPLASIA, AND MALIGNANCY. - NEGATIVE FOR H. PYLORI IN HEMATOXYLIN AND EOSIN SECTIONS.   GROSS DESCRIPTION: A. Labeled: C BX gastric antrum and body Tissue fragment(s): 4 Size: 0.3-0.6 cm Description: in formalin, pink-tan fragments  Entirely submitted in 1 cassette(s).          Final Diagnosis performed by Bryan Lemma, MD.  Electronically signed 01/17/2018 6:00:52PM    The electronic signature indicates that the named Attending Pathologist has e valuated the specimen  Technical component performed at [facility], [address] [place], [place] [address] Lab: [phone] Dir: Rush Farmer, MD, MMM  Professional component performed at [facility], [facility], [place], [place], [place] [address] Lab: [phone] Dir: Dellia Nims. Rubinas, MD      Assessment/Plan:  HYPERTENSION, BENIGN blood pressure control important in reducing the progression of atherosclerotic disease. On appropriate oral medications.   Diabetes mellitus type 2, uncontrolled, with complications Diabetic neuropathy is likely playing a significant role in his lower extremity symptoms and blood glucose control important in reducing the progression of atherosclerotic disease. Also, involved in wound healing. On appropriate medications.   Hyperlipidemia lipid control important in reducing the progression of  atherosclerotic disease. Continue statin therapy   Pain in limb Noninvasive studies today show normal ABIs of 1.2 bilaterally with brisk triphasic waveforms and normal digital pressures consistent with no significant arterial insufficiency of either lower extremity. It does not appears if arterial insufficiency is playing a role in his lower extremity symptoms.  I suspect neuropathic pain is the primary issue.  I will see him back as needed.      Leotis Pain 03/03/2018, 3:58 PM   This note was created with Dragon medical transcription system.  Any errors from dictation are unintentional.

## 2018-03-03 NOTE — Assessment & Plan Note (Signed)
lipid control important in reducing the progression of atherosclerotic disease. Continue statin therapy  

## 2018-03-06 ENCOUNTER — Other Ambulatory Visit: Payer: Self-pay | Admitting: Neurosurgery

## 2018-03-06 ENCOUNTER — Other Ambulatory Visit: Payer: Self-pay | Admitting: Family Medicine

## 2018-03-06 ENCOUNTER — Ambulatory Visit: Payer: Self-pay | Admitting: Family Medicine

## 2018-03-08 ENCOUNTER — Ambulatory Visit (INDEPENDENT_AMBULATORY_CARE_PROVIDER_SITE_OTHER): Payer: Medicare Other | Admitting: Family Medicine

## 2018-03-08 ENCOUNTER — Encounter: Payer: Self-pay | Admitting: Family Medicine

## 2018-03-08 VITALS — BP 126/62 | HR 80 | Temp 98.5°F | Resp 16 | Wt 278.0 lb

## 2018-03-08 DIAGNOSIS — E1165 Type 2 diabetes mellitus with hyperglycemia: Secondary | ICD-10-CM

## 2018-03-08 DIAGNOSIS — I483 Typical atrial flutter: Secondary | ICD-10-CM | POA: Diagnosis not present

## 2018-03-08 DIAGNOSIS — K5909 Other constipation: Secondary | ICD-10-CM | POA: Diagnosis not present

## 2018-03-08 DIAGNOSIS — E118 Type 2 diabetes mellitus with unspecified complications: Secondary | ICD-10-CM

## 2018-03-08 DIAGNOSIS — G629 Polyneuropathy, unspecified: Secondary | ICD-10-CM

## 2018-03-08 DIAGNOSIS — G8929 Other chronic pain: Secondary | ICD-10-CM | POA: Diagnosis not present

## 2018-03-08 DIAGNOSIS — K219 Gastro-esophageal reflux disease without esophagitis: Secondary | ICD-10-CM | POA: Diagnosis not present

## 2018-03-08 DIAGNOSIS — Z86010 Personal history of colonic polyps: Secondary | ICD-10-CM | POA: Diagnosis not present

## 2018-03-08 DIAGNOSIS — M549 Dorsalgia, unspecified: Secondary | ICD-10-CM | POA: Diagnosis not present

## 2018-03-08 DIAGNOSIS — Z6838 Body mass index (BMI) 38.0-38.9, adult: Secondary | ICD-10-CM | POA: Diagnosis not present

## 2018-03-08 DIAGNOSIS — IMO0002 Reserved for concepts with insufficient information to code with codable children: Secondary | ICD-10-CM

## 2018-03-08 MED ORDER — PREGABALIN 300 MG PO CAPS: ORAL_CAPSULE | ORAL | 5 refills | Status: DC

## 2018-03-08 NOTE — Progress Notes (Signed)
[name]    DOB: 11-Jul-1951   67 y.o.   MRN: [id number] Visit Date: 03/08/2018  Today's Provider: [name], MD   Chief Complaint  Patient presents with  . Peripheral Neuropathy    Increased to Lyrica 275mg  three times a day.     Subjective:    HPI   Pt comes in today for a follow up on Neuropathy.  His Lyrica was increase to 275mg  three times a day.  He reports an improvement and is tolerating the increased medication well.      Allergies  Allergen Reactions  . Carisoprodol Itching     Current Outpatient Medications:  .  amiodarone (PACERONE) 200 MG tablet, TAKE 1 TABLET (200 MG TOTAL) BY MOUTH DAILY. (Patient taking differently: Take 200 mg by mouth daily. TAKE 1 TABLET (200 MG TOTAL) BY MOUTH DAILY.), Disp: 180 tablet, Rfl: 3 .  aspirin 81 MG tablet, Take 81 mg by mouth daily., Disp: , Rfl:  .  CINNAMON PO, Take 1,200 mg by mouth 2 (two) times daily., Disp: , Rfl:  .  cyclobenzaprine (FLEXERIL) 10 MG tablet, Take 10 mg by mouth 3 (three) times daily as needed for muscle spasms., Disp: , Rfl:  .  dutasteride (AVODART) 0.5 MG capsule, Take 1 capsule (0.5 mg total) by mouth daily., Disp: 90 capsule, Rfl: 3 .  esomeprazole (NEXIUM) 40 MG capsule, TAKE 1 CAPSULE TWICE DAILY, Disp: 90 capsule, Rfl: 3 .  fexofenadine (ALLEGRA) 180 MG tablet, Take 180 mg by mouth daily.  , Disp: , Rfl:  .  furosemide (LASIX) 40 MG tablet, Take 1 tablet (40 mg total) by mouth daily., Disp: 30 tablet, Rfl: 11 .  gabapentin (NEURONTIN) 800 MG tablet, Take 800 mg by mouth 3 (three) times daily., Disp: , Rfl:  .  glucose blood (ONE TOUCH ULTRA TEST) test strip, CHECK SUGAR TWICE DAILY. DX- E11.8, Disp: 100 each, Rfl: 3 .  hydrochlorothiazide (HYDRODIURIL) 12.5 MG tablet, TAKE 2 TABLETS (25 MG TOTAL) BY MOUTH AT BEDTIME., Disp: 90 tablet, Rfl: 3 .  insulin aspart (NOVOLOG FLEXPEN) 100 UNIT/ML FlexPen, INJECT 14 UNITS 3 TIMES A   DAY (MORNING, NOON, & NIGHT), Disp: 15  pen, Rfl: 3 .  Insulin Pen Needle (PEN NEEDLES) 31G X 5 MM MISC, 1 each by Does not apply route daily. PATIENT NEEDS NOVA TWIST NEEDLES 5 MM  DX E11.9, Disp: 200 each, Rfl: 3 .  JANUVIA 100 MG tablet, TAKE 1 TABLET DAILY, Disp: 90 tablet, Rfl: 3 .  LANTUS SOLOSTAR 100 UNIT/ML Solostar Pen, Inject 30 Units into the skin 2 (two) times daily. (Patient taking differently: Inject 35 Units into the skin 2 (two) times daily. ), Disp: 15 mL, Rfl: 3 .  liraglutide (VICTOZA) 18 MG/3ML SOPN, INJECT 0.3ML (=1.8MG ) INTO THE SKIN DAILY, Disp: 27 mL, Rfl: 3 .  magnesium oxide (MAG-OX) 400 (241.3 Mg) MG tablet, TAKE 1 TABLET (400 MG TOTAL) BY MOUTH 2 (TWO) TIMES DAILY., Disp: 180 tablet, Rfl: 3 .  metoprolol tartrate (LOPRESSOR) 100 MG tablet, TAKE 1 TABLET (100 MG TOTAL) BY MOUTH 2 (TWO) TIMES DAILY., Disp: 60 tablet, Rfl: 10 .  Multiple Vitamins-Minerals (CENTRUM SILVER PO), Take by mouth daily., Disp: , Rfl:  .  NON FORMULARY, CPAP, Disp: , Rfl:  .  Omega-3 Fatty Acids (FISH OIL) 1200 MG CAPS, Take 1,200 mg by mouth daily., Disp: , Rfl:  .  oxyCODONE (OXYCONTIN) 40 mg 12 hr tablet, Take 1  tablet (40 mg total) by mouth every 8 (eight) hours as needed., Disp: 270 tablet, Rfl: 0 .  pregabalin (LYRICA) 200 MG capsule, Three times a day, Disp: 270 capsule, Rfl: 1 .  ranitidine (ZANTAC) 300 MG tablet, Take 1 tablet (300 mg total) by mouth daily., Disp: 90 tablet, Rfl: 3 .  sucralfate (CARAFATE) 1 g tablet, TAKE 1 TABLET (1 G TOTAL) BY MOUTH 4 (FOUR) TIMES DAILY - WITH MEALS AND AT BEDTIME., Disp: 120 tablet, Rfl: 1 .  Testosterone Cypionate (DEPO-TESTOSTERONE IM), Inject into the muscle every 21 ( twenty-one) days., Disp: , Rfl:  .  atorvastatin (LIPITOR) 40 MG tablet, TAKE 1 TABLET (40 MG TOTAL) BY MOUTH DAILY AT 6 PM. (Patient not taking: Reported on 03/03/2018), Disp: 90 tablet, Rfl: 3 .  budesonide (RHINOCORT AQUA) 32 MCG/ACT nasal spray, Place 1 spray daily as needed into both nostrils (for allergies.). (Patient not  taking: Reported on 03/03/2018), Disp: 1 Bottle, Rfl: 12 .  metFORMIN (GLUCOPHAGE) 1000 MG tablet, TAKE 1 TABLET (1,000 MG TOTAL) BY MOUTH 2 (TWO) TIMES DAILY. (Patient not taking: Reported on 03/03/2018), Disp: 60 tablet, Rfl: 11 .  oxyCODONE (OXYCONTIN) 40 mg 12 hr tablet, Take 1 tablet (40 mg total) by mouth every 8 (eight) hours as needed., Disp: 270 tablet, Rfl: 0  Review of Systems  Constitutional: Negative.   HENT: Negative.   Eyes: Negative.   Respiratory: Negative.   Cardiovascular: Negative.   Endocrine: Negative.   Musculoskeletal: Positive for arthralgias, back pain and gait problem. Negative for joint swelling, myalgias, neck pain and neck stiffness.  Allergic/Immunologic: Negative.   Neurological: Negative for dizziness, light-headedness and headaches.  Psychiatric/Behavioral: Negative.     Social History   Tobacco Use  . Smoking status: Former Smoker    Packs/day: 2.00    Years: 20.00    Pack years: 40.00    Types: Cigarettes    Last attempt to quit: 12/26/1989    Years since quitting: 28.2  . Smokeless tobacco: Never Used  Substance Use Topics  . Alcohol use: No   Objective:   BP 126/62 (BP Location: Right Arm, Patient Position: Sitting, Cuff Size: Large)   Pulse 80   Temp 98.5 F (36.9 C) (Oral)   Resp 16   Wt 278 lb (126.1 kg)   BMI 38.77 kg/m  Vitals:   03/08/18 1429  BP: 126/62  Pulse: 80  Resp: 16  Temp: 98.5 F (36.9 C)  TempSrc: Oral  Weight: 278 lb (126.1 kg)     Physical Exam  Constitutional: He is oriented to person, place, and time. He appears well-developed and well-nourished.  HENT:  Head: Normocephalic and atraumatic.  Eyes: Conjunctivae are normal. No scleral icterus.  Neck: No thyromegaly present.  Cardiovascular: Normal rate, regular rhythm and normal heart sounds.  Pulmonary/Chest: Effort normal and breath sounds normal.  Abdominal: Soft.  Neurological: He is alert and oriented to person, place, and time.  Skin: Skin is warm  and dry.  Psychiatric: He has a normal mood and affect. His behavior is normal. Judgment and thought content normal.        Assessment & Plan:     1. Neuropathy  - pregabalin (LYRICA) 300 MG capsule; qhs  Dispense: 90 capsule; Refill: 5  2. Chronic back pain, unspecified back location, unspecified back pain laterality - pregabalin (LYRICA) 300 MG capsule; qhs  Dispense: 90 capsule; Refill: 5 3.TIIDM 4.DDD 5.OA 6.Obesity 7.Constipation Try Movantik dailt  for 3 days.  I have done the  exam and reviewed the chart and it is accurate to the best of my knowledge. Development worker, community has been used and  any errors in dictation or transcription are unintentional. Miguel Aschoff M.D. [place], MD  [facility]

## 2018-03-09 ENCOUNTER — Telehealth: Payer: Self-pay | Admitting: Family Medicine

## 2018-03-09 ENCOUNTER — Other Ambulatory Visit (HOSPITAL_COMMUNITY): Payer: Self-pay | Admitting: Neurosurgery

## 2018-03-09 DIAGNOSIS — G8929 Other chronic pain: Secondary | ICD-10-CM

## 2018-03-09 DIAGNOSIS — M549 Dorsalgia, unspecified: Principal | ICD-10-CM

## 2018-03-09 NOTE — Telephone Encounter (Signed)
Pt's wife Rise Paganini stated that [facility] advised they couldn't fill the Rx for oxyCODONE (OXYCONTIN) 40 mg 12 hr tablet because the Rx stated to fill with generic if available and they don't have generic. Rise Paganini is requesting new Rx for name brand so she can pick it up as soon as possible to mail to pharmacy. Rise Paganini stated that pt is about to run out of the medication. Please advise. Thanks TNP

## 2018-03-09 NOTE — Telephone Encounter (Signed)
Pt's wife Rise Paganini called back after speaking with [facility] and since we are set up to send controlled medications electronically Rise Paganini is requesting that we send the new Rx for (OXYCONTIN) 40 mg 12 hr tablet to [facility] electronically. Please advise. Thanks TNP

## 2018-03-13 ENCOUNTER — Other Ambulatory Visit: Payer: Self-pay | Admitting: Family Medicine

## 2018-03-14 ENCOUNTER — Other Ambulatory Visit: Payer: Self-pay | Admitting: Family Medicine

## 2018-03-14 DIAGNOSIS — G8929 Other chronic pain: Secondary | ICD-10-CM

## 2018-03-14 DIAGNOSIS — M549 Dorsalgia, unspecified: Principal | ICD-10-CM

## 2018-03-14 MED ORDER — OXYCODONE HCL ER 40 MG PO T12A: 40.0000 mg | EXTENDED_RELEASE_TABLET | Freq: Three times a day (TID) | ORAL | 0 refills | Status: DC | PRN

## 2018-03-14 NOTE — Telephone Encounter (Signed)
Sent to mail order

## 2018-04-06 ENCOUNTER — Ambulatory Visit: Payer: Self-pay | Admitting: Family Medicine

## 2018-04-07 ENCOUNTER — Other Ambulatory Visit: Payer: Self-pay | Admitting: Family Medicine

## 2018-04-10 ENCOUNTER — Other Ambulatory Visit: Payer: Self-pay | Admitting: Family Medicine

## 2018-04-22 ENCOUNTER — Emergency Department: Payer: Medicare Other

## 2018-04-22 ENCOUNTER — Other Ambulatory Visit: Payer: Self-pay

## 2018-04-22 ENCOUNTER — Encounter: Payer: Self-pay | Admitting: Internal Medicine

## 2018-04-22 ENCOUNTER — Inpatient Hospital Stay
Admission: EM | Admit: 2018-04-22 | Discharge: 2018-04-28 | DRG: 871 | Disposition: A | Discharge status: Home Health | Payer: Medicare Other | Attending: Internal Medicine | Admitting: Internal Medicine

## 2018-04-22 DIAGNOSIS — E1142 Type 2 diabetes mellitus with diabetic polyneuropathy: Secondary | ICD-10-CM | POA: Diagnosis present

## 2018-04-22 DIAGNOSIS — Z87891 Personal history of nicotine dependence: Secondary | ICD-10-CM

## 2018-04-22 DIAGNOSIS — J9611 Chronic respiratory failure with hypoxia: Secondary | ICD-10-CM | POA: Diagnosis not present

## 2018-04-22 DIAGNOSIS — N179 Acute kidney failure, unspecified: Secondary | ICD-10-CM | POA: Diagnosis present

## 2018-04-22 DIAGNOSIS — Z8673 Personal history of transient ischemic attack (TIA), and cerebral infarction without residual deficits: Secondary | ICD-10-CM

## 2018-04-22 DIAGNOSIS — D509 Iron deficiency anemia, unspecified: Secondary | ICD-10-CM | POA: Diagnosis present

## 2018-04-22 DIAGNOSIS — N182 Chronic kidney disease, stage 2 (mild): Secondary | ICD-10-CM | POA: Diagnosis not present

## 2018-04-22 DIAGNOSIS — Z6838 Body mass index (BMI) 38.0-38.9, adult: Secondary | ICD-10-CM | POA: Diagnosis not present

## 2018-04-22 DIAGNOSIS — I5032 Chronic diastolic (congestive) heart failure: Secondary | ICD-10-CM | POA: Diagnosis present

## 2018-04-22 DIAGNOSIS — I130 Hypertensive heart and chronic kidney disease with heart failure and stage 1 through stage 4 chronic kidney disease, or unspecified chronic kidney disease: Secondary | ICD-10-CM | POA: Diagnosis present

## 2018-04-22 DIAGNOSIS — I4892 Unspecified atrial flutter: Secondary | ICD-10-CM | POA: Diagnosis not present

## 2018-04-22 DIAGNOSIS — M545 Low back pain: Secondary | ICD-10-CM | POA: Diagnosis present

## 2018-04-22 DIAGNOSIS — J690 Pneumonitis due to inhalation of food and vomit: Secondary | ICD-10-CM | POA: Diagnosis not present

## 2018-04-22 DIAGNOSIS — E1151 Type 2 diabetes mellitus with diabetic peripheral angiopathy without gangrene: Secondary | ICD-10-CM | POA: Diagnosis present

## 2018-04-22 DIAGNOSIS — A4102 Sepsis due to Methicillin resistant Staphylococcus aureus: Secondary | ICD-10-CM | POA: Diagnosis not present

## 2018-04-22 DIAGNOSIS — I4891 Unspecified atrial fibrillation: Secondary | ICD-10-CM | POA: Diagnosis present

## 2018-04-22 DIAGNOSIS — R739 Hyperglycemia, unspecified: Secondary | ICD-10-CM

## 2018-04-22 DIAGNOSIS — E876 Hypokalemia: Secondary | ICD-10-CM | POA: Diagnosis present

## 2018-04-22 DIAGNOSIS — E662 Morbid (severe) obesity with alveolar hypoventilation: Secondary | ICD-10-CM | POA: Diagnosis present

## 2018-04-22 DIAGNOSIS — E1165 Type 2 diabetes mellitus with hyperglycemia: Secondary | ICD-10-CM | POA: Diagnosis present

## 2018-04-22 DIAGNOSIS — Z8701 Personal history of pneumonia (recurrent): Secondary | ICD-10-CM

## 2018-04-22 DIAGNOSIS — J9601 Acute respiratory failure with hypoxia: Secondary | ICD-10-CM | POA: Diagnosis not present

## 2018-04-22 DIAGNOSIS — E785 Hyperlipidemia, unspecified: Secondary | ICD-10-CM | POA: Diagnosis present

## 2018-04-22 DIAGNOSIS — Z96651 Presence of right artificial knee joint: Secondary | ICD-10-CM | POA: Diagnosis present

## 2018-04-22 DIAGNOSIS — R509 Fever, unspecified: Secondary | ICD-10-CM

## 2018-04-22 DIAGNOSIS — J189 Pneumonia, unspecified organism: Secondary | ICD-10-CM | POA: Diagnosis not present

## 2018-04-22 DIAGNOSIS — N400 Enlarged prostate without lower urinary tract symptoms: Secondary | ICD-10-CM | POA: Diagnosis present

## 2018-04-22 DIAGNOSIS — J181 Lobar pneumonia, unspecified organism: Secondary | ICD-10-CM | POA: Diagnosis not present

## 2018-04-22 DIAGNOSIS — E1121 Type 2 diabetes mellitus with diabetic nephropathy: Secondary | ICD-10-CM

## 2018-04-22 DIAGNOSIS — G4733 Obstructive sleep apnea (adult) (pediatric): Secondary | ICD-10-CM | POA: Diagnosis not present

## 2018-04-22 DIAGNOSIS — K222 Esophageal obstruction: Secondary | ICD-10-CM | POA: Diagnosis present

## 2018-04-22 DIAGNOSIS — Z86711 Personal history of pulmonary embolism: Secondary | ICD-10-CM

## 2018-04-22 DIAGNOSIS — Z8249 Family history of ischemic heart disease and other diseases of the circulatory system: Secondary | ICD-10-CM

## 2018-04-22 DIAGNOSIS — K219 Gastro-esophageal reflux disease without esophagitis: Secondary | ICD-10-CM | POA: Diagnosis present

## 2018-04-22 DIAGNOSIS — R7881 Bacteremia: Secondary | ICD-10-CM | POA: Diagnosis not present

## 2018-04-22 DIAGNOSIS — Z7982 Long term (current) use of aspirin: Secondary | ICD-10-CM

## 2018-04-22 DIAGNOSIS — I10 Essential (primary) hypertension: Secondary | ICD-10-CM | POA: Diagnosis not present

## 2018-04-22 DIAGNOSIS — G2581 Restless legs syndrome: Secondary | ICD-10-CM | POA: Diagnosis present

## 2018-04-22 DIAGNOSIS — Z794 Long term (current) use of insulin: Secondary | ICD-10-CM

## 2018-04-22 DIAGNOSIS — A419 Sepsis, unspecified organism: Secondary | ICD-10-CM | POA: Diagnosis present

## 2018-04-22 DIAGNOSIS — R1310 Dysphagia, unspecified: Secondary | ICD-10-CM | POA: Diagnosis present

## 2018-04-22 DIAGNOSIS — H5461 Unqualified visual loss, right eye, normal vision left eye: Secondary | ICD-10-CM | POA: Diagnosis present

## 2018-04-22 DIAGNOSIS — J9621 Acute and chronic respiratory failure with hypoxia: Secondary | ICD-10-CM | POA: Diagnosis present

## 2018-04-22 DIAGNOSIS — N289 Disorder of kidney and ureter, unspecified: Secondary | ICD-10-CM

## 2018-04-22 DIAGNOSIS — G9340 Encephalopathy, unspecified: Secondary | ICD-10-CM | POA: Diagnosis present

## 2018-04-22 DIAGNOSIS — R0602 Shortness of breath: Secondary | ICD-10-CM | POA: Diagnosis not present

## 2018-04-22 DIAGNOSIS — R05 Cough: Secondary | ICD-10-CM | POA: Diagnosis not present

## 2018-04-22 DIAGNOSIS — G8929 Other chronic pain: Secondary | ICD-10-CM

## 2018-04-22 DIAGNOSIS — E872 Acidosis: Secondary | ICD-10-CM | POA: Diagnosis not present

## 2018-04-22 DIAGNOSIS — M549 Dorsalgia, unspecified: Secondary | ICD-10-CM

## 2018-04-22 DIAGNOSIS — E1122 Type 2 diabetes mellitus with diabetic chronic kidney disease: Secondary | ICD-10-CM | POA: Diagnosis present

## 2018-04-22 DIAGNOSIS — R000 Tachycardia, unspecified: Secondary | ICD-10-CM | POA: Diagnosis present

## 2018-04-22 DIAGNOSIS — Z79890 Hormone replacement therapy (postmenopausal): Secondary | ICD-10-CM

## 2018-04-22 LAB — CBC WITH DIFFERENTIAL/PLATELET
Basophils Absolute: 0 10*3/uL (ref 0–0.1)
Basophils Relative: 0 %
Eosinophils Absolute: 0 10*3/uL (ref 0–0.7)
Eosinophils Relative: 0 %
HCT: 39.7 % — ABNORMAL LOW (ref 40.0–52.0)
Hemoglobin: 12.9 g/dL — ABNORMAL LOW (ref 13.0–18.0)
Lymphocytes Relative: 7 %
Lymphs Abs: 0.9 10*3/uL — ABNORMAL LOW (ref 1.0–3.6)
MCH: 24.5 pg — ABNORMAL LOW (ref 26.0–34.0)
MCHC: 32.4 g/dL (ref 32.0–36.0)
MCV: 75.6 fL — ABNORMAL LOW (ref 80.0–100.0)
Monocytes Absolute: 0.7 10*3/uL (ref 0.2–1.0)
Monocytes Relative: 5 %
Neutro Abs: 11.8 10*3/uL — ABNORMAL HIGH (ref 1.4–6.5)
Neutrophils Relative %: 88 %
Platelets: 133 10*3/uL — ABNORMAL LOW (ref 150–440)
RBC: 5.25 MIL/uL (ref 4.40–5.90)
RDW: 17.9 % — ABNORMAL HIGH (ref 11.5–14.5)
WBC: 13.5 10*3/uL — ABNORMAL HIGH (ref 3.8–10.6)

## 2018-04-22 LAB — BLOOD CULTURE ID PANEL (REFLEXED)

## 2018-04-22 LAB — COMPREHENSIVE METABOLIC PANEL
ALT: 35 U/L (ref 17–63)
AST: 45 U/L — ABNORMAL HIGH (ref 15–41)
Albumin: 3.9 g/dL (ref 3.5–5.0)
Alkaline Phosphatase: 110 U/L (ref 38–126)
Anion gap: 9 (ref 5–15)
BUN: 26 mg/dL — ABNORMAL HIGH (ref 6–20)
CO2: 29 mmol/L (ref 22–32)
Calcium: 9.5 mg/dL (ref 8.9–10.3)
Chloride: 98 mmol/L — ABNORMAL LOW (ref 101–111)
Creatinine, Ser: 1.42 mg/dL — ABNORMAL HIGH (ref 0.61–1.24)
GFR calc Af Amer: 57 mL/min — ABNORMAL LOW (ref >60)
GFR calc non Af Amer: 49 mL/min — ABNORMAL LOW (ref >60)
Glucose, Bld: 359 mg/dL — ABNORMAL HIGH (ref 65–99)
Potassium: 3.6 mmol/L (ref 3.5–5.1)
Sodium: 136 mmol/L (ref 135–145)
Total Bilirubin: 0.6 mg/dL (ref 0.3–1.2)
Total Protein: 7.3 g/dL (ref 6.5–8.1)

## 2018-04-22 LAB — GLUCOSE, CAPILLARY
Glucose-Capillary: 264 mg/dL — ABNORMAL HIGH (ref 65–99)
Glucose-Capillary: 301 mg/dL — ABNORMAL HIGH (ref 65–99)
Glucose-Capillary: 239 mg/dL — ABNORMAL HIGH (ref 65–99)
Glucose-Capillary: 161 mg/dL — ABNORMAL HIGH (ref 65–99)
Glucose-Capillary: 98 mg/dL (ref 65–99)

## 2018-04-22 LAB — URINALYSIS, COMPLETE (UACMP) WITH MICROSCOPIC
Bacteria, UA: NONE SEEN
Bilirubin Urine: NEGATIVE
Glucose, UA: >=500 mg/dL — AB
Hgb urine dipstick: NEGATIVE
Ketones, ur: 5 mg/dL — AB
Leukocytes, UA: NEGATIVE
Nitrite: NEGATIVE
Protein, ur: NEGATIVE mg/dL
Specific Gravity, Urine: 1.016 (ref 1.005–1.030)
Squamous Epithelial / LPF: NONE SEEN (ref 0–5)
pH: 5 (ref 5.0–8.0)

## 2018-04-22 LAB — BLOOD GAS, ARTERIAL
Acid-Base Excess: 3.2 mmol/L — ABNORMAL HIGH (ref 0.0–2.0)
Bicarbonate: 28.5 mmol/L — ABNORMAL HIGH (ref 20.0–28.0)
FIO2: 100
O2 Saturation: 96.3 %
Patient temperature: 37
pCO2 arterial: 45 mmHg (ref 32.0–48.0)
pH, Arterial: 7.41 (ref 7.350–7.450)
pO2, Arterial: 83 mmHg (ref 83.0–108.0)

## 2018-04-22 LAB — LACTIC ACID, PLASMA
Lactic Acid, Venous: 2.9 mmol/L (ref 0.5–1.9)
Lactic Acid, Venous: 2 mmol/L (ref 0.5–1.9)

## 2018-04-22 LAB — TROPONIN I
Troponin I: <0.03 ng/mL (ref <0.03)
Troponin I: 0.04 ng/mL (ref <0.03)

## 2018-04-22 LAB — PROCALCITONIN: Procalcitonin: 8.18 ng/mL

## 2018-04-22 LAB — MAGNESIUM: Magnesium: 1.8 mg/dL (ref 1.7–2.4)

## 2018-04-22 LAB — STREP PNEUMONIAE URINARY ANTIGEN: Strep Pneumo Urinary Antigen: NEGATIVE

## 2018-04-22 LAB — PHOSPHORUS: Phosphorus: 2.6 mg/dL (ref 2.5–4.6)

## 2018-04-22 LAB — INFLUENZA PANEL BY PCR (TYPE A & B)
Influenza A By PCR: NEGATIVE
Influenza B By PCR: NEGATIVE

## 2018-04-22 LAB — BRAIN NATRIURETIC PEPTIDE: B Natriuretic Peptide: 49 pg/mL (ref 0.0–100.0)

## 2018-04-22 LAB — MRSA PCR SCREENING: MRSA by PCR: NEGATIVE

## 2018-04-22 MED ORDER — ENOXAPARIN SODIUM 40 MG/0.4ML ~~LOC~~ SOLN
40.0000 mg | SUBCUTANEOUS | Status: DC
  Administered 2018-04-23 – 2018-04-28 (×7): 40 mg via SUBCUTANEOUS
  Filled 2018-04-22 (×6): qty 0.4

## 2018-04-22 MED ORDER — SODIUM CHLORIDE 0.9 % IV SOLN: 1.0000 g | INTRAVENOUS | Status: DC

## 2018-04-22 MED ORDER — INSULIN ASPART 100 UNIT/ML ~~LOC~~ SOLN
0.0000 [IU] | SUBCUTANEOUS | Status: DC
  Administered 2018-04-22: 4 [IU] via SUBCUTANEOUS
  Administered 2018-04-22 – 2018-04-23 (×2): 7 [IU] via SUBCUTANEOUS
  Administered 2018-04-23: 3 [IU] via SUBCUTANEOUS
  Administered 2018-04-23: 11 [IU] via SUBCUTANEOUS
  Administered 2018-04-23 (×2): 3 [IU] via SUBCUTANEOUS
  Administered 2018-04-24: 11 [IU] via SUBCUTANEOUS
  Administered 2018-04-24 (×3): 4 [IU] via SUBCUTANEOUS
  Filled 2018-04-22 (×11): qty 1

## 2018-04-22 MED ORDER — CHLORHEXIDINE GLUCONATE 0.12 % MT SOLN
15.0000 mL | Freq: Two times a day (BID) | OROMUCOSAL | Status: DC
  Administered 2018-04-22 – 2018-04-28 (×11): 15 mL via OROMUCOSAL
  Filled 2018-04-22 (×11): qty 15

## 2018-04-22 MED ORDER — INSULIN ASPART 100 UNIT/ML ~~LOC~~ SOLN
14.0000 [IU] | Freq: Three times a day (TID) | SUBCUTANEOUS | Status: DC
  Administered 2018-04-22 – 2018-04-28 (×16): 14 [IU] via SUBCUTANEOUS
  Filled 2018-04-22 (×17): qty 1

## 2018-04-22 MED ORDER — PREGABALIN 75 MG PO CAPS
200.0000 mg | ORAL_CAPSULE | Freq: Two times a day (BID) | ORAL | Status: DC
  Administered 2018-04-22 – 2018-04-28 (×13): 200 mg via ORAL
  Filled 2018-04-22: qty 1
  Filled 2018-04-22 (×2): qty 2
  Filled 2018-04-22 (×5): qty 1
  Filled 2018-04-22 (×3): qty 2
  Filled 2018-04-22 (×3): qty 1

## 2018-04-22 MED ORDER — VANCOMYCIN HCL 10 G IV SOLR
2000.0000 mg | Freq: Once | INTRAVENOUS | Status: AC
  Administered 2018-04-22: 2000 mg via INTRAVENOUS
  Filled 2018-04-22: qty 2000

## 2018-04-22 MED ORDER — SODIUM CHLORIDE 0.9 % IV BOLUS
1000.0000 mL | Freq: Once | INTRAVENOUS | Status: AC
  Administered 2018-04-22: 1000 mL via INTRAVENOUS

## 2018-04-22 MED ORDER — BISACODYL 5 MG PO TBEC: 5.0000 mg | DELAYED_RELEASE_TABLET | Freq: Every day | ORAL | Status: DC | PRN

## 2018-04-22 MED ORDER — AZITHROMYCIN 500 MG IV SOLR
500.0000 mg | INTRAVENOUS | Status: DC
  Administered 2018-04-22: 500 mg via INTRAVENOUS
  Filled 2018-04-22: qty 500

## 2018-04-22 MED ORDER — ENOXAPARIN SODIUM 40 MG/0.4ML ~~LOC~~ SOLN
40.0000 mg | SUBCUTANEOUS | Status: DC
  Administered 2018-04-22: 40 mg via SUBCUTANEOUS
  Filled 2018-04-22: qty 0.4

## 2018-04-22 MED ORDER — AMIODARONE HCL 200 MG PO TABS
200.0000 mg | ORAL_TABLET | Freq: Every day | ORAL | Status: DC
Start: 2018-04-22 — End: 2018-04-28
  Administered 2018-04-22 – 2018-04-28 (×7): 200 mg via ORAL
  Filled 2018-04-22 (×7): qty 1

## 2018-04-22 MED ORDER — PREGABALIN 75 MG PO CAPS
300.0000 mg | ORAL_CAPSULE | Freq: Every day | ORAL | Status: DC
  Administered 2018-04-22 – 2018-04-27 (×6): 300 mg via ORAL
  Filled 2018-04-22 (×8): qty 4

## 2018-04-22 MED ORDER — SODIUM CHLORIDE 0.9 % IV SOLN: 100.0000 mg | Freq: Two times a day (BID) | INTRAVENOUS | Status: DC

## 2018-04-22 MED ORDER — PANTOPRAZOLE SODIUM 40 MG PO TBEC
40.0000 mg | DELAYED_RELEASE_TABLET | Freq: Every day | ORAL | Status: DC
  Administered 2018-04-22 – 2018-04-28 (×7): 40 mg via ORAL
  Filled 2018-04-22 (×7): qty 1

## 2018-04-22 MED ORDER — SENNOSIDES-DOCUSATE SODIUM 8.6-50 MG PO TABS: 2.0000 | ORAL_TABLET | Freq: Two times a day (BID) | ORAL | Status: DC | PRN

## 2018-04-22 MED ORDER — IPRATROPIUM-ALBUTEROL 0.5-2.5 (3) MG/3ML IN SOLN
3.0000 mL | Freq: Four times a day (QID) | RESPIRATORY_TRACT | Status: DC
  Administered 2018-04-22 – 2018-04-28 (×26): 3 mL via RESPIRATORY_TRACT
  Filled 2018-04-22 (×26): qty 3

## 2018-04-22 MED ORDER — DUTASTERIDE 0.5 MG PO CAPS
0.5000 mg | ORAL_CAPSULE | Freq: Every day | ORAL | Status: DC
  Administered 2018-04-22 – 2018-04-28 (×7): 0.5 mg via ORAL
  Filled 2018-04-22 (×7): qty 1

## 2018-04-22 MED ORDER — ADULT MULTIVITAMIN W/MINERALS CH
1.0000 | ORAL_TABLET | Freq: Every day | ORAL | Status: DC
  Administered 2018-04-22 – 2018-04-28 (×7): 1 via ORAL
  Filled 2018-04-22 (×7): qty 1

## 2018-04-22 MED ORDER — ASPIRIN EC 81 MG PO TBEC
81.0000 mg | DELAYED_RELEASE_TABLET | Freq: Every day | ORAL | Status: DC
Start: 2018-04-22 — End: 2018-04-28
  Administered 2018-04-22 – 2018-04-28 (×7): 81 mg via ORAL
  Filled 2018-04-22 (×7): qty 1

## 2018-04-22 MED ORDER — INSULIN GLARGINE 100 UNIT/ML ~~LOC~~ SOLN
30.0000 [IU] | Freq: Two times a day (BID) | SUBCUTANEOUS | Status: DC
  Administered 2018-04-22 – 2018-04-28 (×13): 30 [IU] via SUBCUTANEOUS
  Filled 2018-04-22 (×17): qty 0.3

## 2018-04-22 MED ORDER — FENTANYL CITRATE (PF) 100 MCG/2ML IJ SOLN
INTRAMUSCULAR | Status: AC
  Filled 2018-04-22: qty 2

## 2018-04-22 MED ORDER — OXYCODONE HCL ER 15 MG PO T12A
40.0000 mg | EXTENDED_RELEASE_TABLET | Freq: Three times a day (TID) | ORAL | Status: DC
  Administered 2018-04-22 – 2018-04-28 (×20): 40 mg via ORAL
  Filled 2018-04-22 (×6): qty 2
  Filled 2018-04-22: qty 1
  Filled 2018-04-22: qty 2
  Filled 2018-04-22: qty 1
  Filled 2018-04-22: qty 2
  Filled 2018-04-22: qty 1
  Filled 2018-04-22 (×4): qty 2
  Filled 2018-04-22: qty 1
  Filled 2018-04-22 (×2): qty 2
  Filled 2018-04-22: qty 1
  Filled 2018-04-22: qty 2

## 2018-04-22 MED ORDER — INSULIN ASPART 100 UNIT/ML ~~LOC~~ SOLN
0.0000 [IU] | Freq: Three times a day (TID) | SUBCUTANEOUS | Status: DC
  Administered 2018-04-22: 11 [IU] via SUBCUTANEOUS
  Filled 2018-04-22: qty 1

## 2018-04-22 MED ORDER — FAMOTIDINE 20 MG PO TABS
20.0000 mg | ORAL_TABLET | Freq: Every day | ORAL | Status: DC
  Administered 2018-04-22 – 2018-04-28 (×7): 20 mg via ORAL
  Filled 2018-04-22 (×7): qty 1

## 2018-04-22 MED ORDER — ACETAMINOPHEN 650 MG RE SUPP
650.0000 mg | Freq: Once | RECTAL | Status: AC
  Administered 2018-04-22: 650 mg via RECTAL

## 2018-04-22 MED ORDER — SODIUM CHLORIDE 0.9 % IV BOLUS (SEPSIS)
1000.0000 mL | Freq: Once | INTRAVENOUS | Status: AC
Start: 2018-04-22 — End: 2018-04-22
  Administered 2018-04-22: 1000 mL via INTRAVENOUS

## 2018-04-22 MED ORDER — HYDROCHLOROTHIAZIDE 25 MG PO TABS
25.0000 mg | ORAL_TABLET | Freq: Every day | ORAL | Status: DC
  Administered 2018-04-22 – 2018-04-27 (×6): 25 mg via ORAL
  Filled 2018-04-22 (×6): qty 1

## 2018-04-22 MED ORDER — OXYCODONE HCL 5 MG PO TABS: 5.0000 mg | ORAL_TABLET | ORAL | Status: DC | PRN

## 2018-04-22 MED ORDER — ACETAMINOPHEN 325 MG RE SUPP
RECTAL | Status: AC
  Filled 2018-04-22: qty 1

## 2018-04-22 MED ORDER — SODIUM CHLORIDE 0.9 % IV SOLN
INTRAVENOUS | Status: DC
  Administered 2018-04-22 – 2018-04-23 (×2): via INTRAVENOUS

## 2018-04-22 MED ORDER — ATORVASTATIN CALCIUM 20 MG PO TABS
40.0000 mg | ORAL_TABLET | Freq: Every day | ORAL | Status: DC
  Administered 2018-04-22 – 2018-04-28 (×7): 40 mg via ORAL
  Filled 2018-04-22 (×7): qty 2

## 2018-04-22 MED ORDER — GABAPENTIN 400 MG PO CAPS: 800.0000 mg | ORAL_CAPSULE | Freq: Three times a day (TID) | ORAL | Status: DC

## 2018-04-22 MED ORDER — SUCRALFATE 1 G PO TABS
1.0000 g | ORAL_TABLET | Freq: Three times a day (TID) | ORAL | Status: DC
  Administered 2018-04-22 – 2018-04-28 (×25): 1 g via ORAL
  Filled 2018-04-22 (×25): qty 1

## 2018-04-22 MED ORDER — ACETAMINOPHEN 325 MG RE SUPP
325.0000 mg | Freq: Once | RECTAL | Status: AC
  Administered 2018-04-22: 325 mg via RECTAL

## 2018-04-22 MED ORDER — SODIUM CHLORIDE 0.9 % IV SOLN
2.0000 g | INTRAVENOUS | Status: DC
  Administered 2018-04-22: 2 g via INTRAVENOUS
  Filled 2018-04-22: qty 20

## 2018-04-22 MED ORDER — SODIUM CHLORIDE 0.9 % IV SOLN
1.0000 g | INTRAVENOUS | Status: DC
  Administered 2018-04-22: 1 g via INTRAVENOUS
  Filled 2018-04-22: qty 10

## 2018-04-22 MED ORDER — SODIUM CHLORIDE 0.9 % IV SOLN
100.0000 mg | Freq: Two times a day (BID) | INTRAVENOUS | Status: DC
  Administered 2018-04-22: 100 mg via INTRAVENOUS
  Filled 2018-04-22 (×2): qty 100

## 2018-04-22 MED ORDER — ORAL CARE MOUTH RINSE
15.0000 mL | Freq: Two times a day (BID) | OROMUCOSAL | Status: DC
  Administered 2018-04-22 – 2018-04-28 (×9): 15 mL via OROMUCOSAL

## 2018-04-22 MED ORDER — METOPROLOL TARTRATE 50 MG PO TABS
100.0000 mg | ORAL_TABLET | Freq: Two times a day (BID) | ORAL | Status: DC
  Administered 2018-04-22 – 2018-04-28 (×13): 100 mg via ORAL
  Filled 2018-04-22 (×13): qty 2

## 2018-04-22 MED ORDER — FUROSEMIDE 40 MG PO TABS
40.0000 mg | ORAL_TABLET | Freq: Every day | ORAL | Status: DC
  Administered 2018-04-23 – 2018-04-25 (×3): 40 mg via ORAL
  Filled 2018-04-22: qty 2
  Filled 2018-04-22 (×2): qty 1

## 2018-04-22 MED ORDER — ACETAMINOPHEN 325 MG PO TABS
650.0000 mg | ORAL_TABLET | Freq: Four times a day (QID) | ORAL | Status: DC | PRN
Start: 2018-04-22 — End: 2018-04-28
  Administered 2018-04-23 – 2018-04-24 (×3): 650 mg via ORAL
  Filled 2018-04-22 (×3): qty 2

## 2018-04-22 MED ORDER — OXYCODONE HCL 5 MG PO TABS
10.0000 mg | ORAL_TABLET | ORAL | Status: DC | PRN
  Administered 2018-04-27 (×2): 10 mg via ORAL
  Filled 2018-04-22 (×2): qty 2

## 2018-04-22 MED ORDER — SODIUM CHLORIDE 0.9 % IV BOLUS (SEPSIS)
1000.0000 mL | Freq: Once | INTRAVENOUS | Status: AC
  Administered 2018-04-22: 1000 mL via INTRAVENOUS

## 2018-04-22 MED ORDER — ONDANSETRON HCL 4 MG/2ML IJ SOLN
4.0000 mg | Freq: Four times a day (QID) | INTRAMUSCULAR | Status: DC | PRN
Start: 2018-04-22 — End: 2018-04-28

## 2018-04-22 MED ORDER — VANCOMYCIN HCL 10 G IV SOLR
1250.0000 mg | Freq: Two times a day (BID) | INTRAVENOUS | Status: DC
  Administered 2018-04-23 – 2018-04-24 (×4): 1250 mg via INTRAVENOUS
  Filled 2018-04-22 (×8): qty 1250

## 2018-04-22 MED ORDER — FENTANYL CITRATE (PF) 100 MCG/2ML IJ SOLN
25.0000 ug | Freq: Once | INTRAMUSCULAR | Status: AC
  Administered 2018-04-22: 25 ug via INTRAVENOUS

## 2018-04-22 MED ORDER — CYCLOBENZAPRINE HCL 10 MG PO TABS
10.0000 mg | ORAL_TABLET | Freq: Two times a day (BID) | ORAL | Status: DC
  Administered 2018-04-22 – 2018-04-28 (×13): 10 mg via ORAL
  Filled 2018-04-22 (×14): qty 1

## 2018-04-22 MED ORDER — ACETAMINOPHEN 650 MG RE SUPP
RECTAL | Status: AC
  Filled 2018-04-22: qty 1

## 2018-04-22 NOTE — Progress Notes (Signed)
PHARMACY - PHYSICIAN COMMUNICATION CRITICAL VALUE ALERT - BLOOD CULTURE IDENTIFICATION (BCID)  Jerry Parsons is an 67 y.o. male who presented to [facility] on 04/22/2018 with a chief complaint of pna  Assessment:  Bacteremia with staph aureus meca detected (include suspected source if known)  Name of physician (or Provider) Contacted: Magdelena T  Current antibiotics: doxy/ceftriaxone  Changes to prescribed antibiotics recommended:  add vancomycin per provider   Results for orders placed or performed during the hospital encounter of 04/22/18  Blood Culture ID Panel (Reflexed) (Collected: 04/22/2018  2:54 AM)  Result Value Ref Range   Enterococcus species NOT DETECTED NOT DETECTED   Listeria monocytogenes NOT DETECTED NOT DETECTED   Staphylococcus species DETECTED (A) NOT DETECTED   Staphylococcus aureus DETECTED (A) NOT DETECTED   Methicillin resistance DETECTED (A) NOT DETECTED   Streptococcus species NOT DETECTED NOT DETECTED   Streptococcus agalactiae NOT DETECTED NOT DETECTED   Streptococcus pneumoniae NOT DETECTED NOT DETECTED   Streptococcus pyogenes NOT DETECTED NOT DETECTED   Acinetobacter baumannii NOT DETECTED NOT DETECTED   Enterobacteriaceae species NOT DETECTED NOT DETECTED   Enterobacter cloacae complex NOT DETECTED NOT DETECTED   Escherichia coli NOT DETECTED NOT DETECTED   Klebsiella oxytoca NOT DETECTED NOT DETECTED   Klebsiella pneumoniae NOT DETECTED NOT DETECTED   Proteus species NOT DETECTED NOT DETECTED   Serratia marcescens NOT DETECTED NOT DETECTED   Haemophilus influenzae NOT DETECTED NOT DETECTED   Neisseria meningitidis NOT DETECTED NOT DETECTED   Pseudomonas aeruginosa NOT DETECTED NOT DETECTED   Candida albicans NOT DETECTED NOT DETECTED   Candida glabrata NOT DETECTED NOT DETECTED   Candida krusei NOT DETECTED NOT DETECTED   Candida parapsilosis NOT DETECTED NOT DETECTED   Candida tropicalis NOT DETECTED NOT DETECTED    Donna Christen  Vernette Moise 04/22/2018  8:01 PM

## 2018-04-22 NOTE — ED Provider Notes (Signed)
[facility] Emergency Department Provider Note   ____________________________________________   First MD Initiated Contact with Patient 04/22/18 [phone]     (approximate)  I have reviewed the triage vital signs and the nursing notes.   HISTORY  Chief Complaint Fever    HPI Jerry Parsons is a 67 y.o. male brought to the ED via EMS from home with a chief complaint of fever, cough, shortness of breath, and left upper back pain. Wife reports patient has had pneumonia 9 times. Reports nonproductive cough, feeling feverish, with labored breathing.  Symptoms associated with chest tightness.  Denies abdominal pain, nausea, vomiting, dysuria, diarrhea.  Denies recent travel or trauma.  No antipyretics taken prior to arrival.   Past Medical History:  Diagnosis Date  . Arrhythmia    tachycardia, A-Fib  . Arthritis   . Blind right eye   . BPH (benign prostatic hyperplasia)   . Diabetes mellitus   . DVT (deep venous thrombosis) ([place]) 02/2010   leg thrombus ; dislodged into emboli and caused PE  . Dyspnea   . Dysrhythmia   . 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 GERD (gastroesophageal reflux disease)   . Hypercholesteremia   . Hypertension   . Kidney failure   . Neuromuscular disorder ([place])   . Neuropathy   . Pneumonia    time 9 ;last episode 12/2015  . Pulmonary embolus ([place]) 2011  . Seasonal allergies   . Seizures ([place])    as child   . Sleep apnea    BIPAP  . Stiff neck    limited turning s/p titanium plate placement  . Stroke ([place])   . TIA (transient ischemic attack)   . Wears dentures    full upper and lower    Patient Active Problem List   Diagnosis Date Noted  . Pain in limb 03/03/2018  . OA (osteoarthritis) of knee 06/20/2017  . Sepsis due to pneumonia ([place]) 05/07/2017  . Neuropathy 10/13/2016  . Amblyopia 12/30/2015  . 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 NS (nuclear sclerosis) 12/30/2015  . Pseudoaphakia 12/30/2015  . CVA  (cerebral infarction) 09/08/2015  . Dehydration 09/08/2015  . Aspiration pneumonia ([place]) 07/04/2015  . Paroxysmal atrial fibrillation ([place]) 07/04/2015  . Arthritis of knee, degenerative 05/28/2015  . Chronic back pain 09/17/2014  . Leg edema 05/07/2014  . Chronic diastolic CHF (congestive heart failure) ([place]) 05/07/2014  . Campylobacter diarrhea 04/25/2013  . Atrial flutter ([place]) 01/23/2013  . Obesity 05/19/2012  . Hyperlipidemia 11/11/2011  . Diastolic dysfunction 93/81/0175  . SOB (shortness of breath) 04/12/2011  . Diabetes mellitus type 2, uncontrolled, with complications ([place]) 10/20/8526  . HYPERTENSION, BENIGN 03/15/2011  . DVT 03/15/2011  . TACHYCARDIA 03/15/2011    Past Surgical History:  Procedure Laterality Date  . APPENDECTOMY    . BACK SURGERY     x 8; upper x 3 & lower x 5  . CARDIOVERSION  03/14/13, 10/16   2014 - [place], 2016 - [place]  . CATARACT EXTRACTION W/PHACO Left 10/29/2015   Procedure: CATARACT EXTRACTION PHACO AND INTRAOCULAR LENS PLACEMENT (IOC);  Surgeon: Leandrew Koyanagi, MD;  Location: [facility];  Service: Ophthalmology;  Laterality: Left;  DIABETIC - insulin and oral medsSleep apnea - no machine  . CHOLECYSTECTOMY    . COLONOSCOPY WITH PROPOFOL N/A 01/16/2018   Procedure: COLONOSCOPY WITH PROPOFOL;  Surgeon: Manya Silvas, MD;  Location: [facility] ENDOSCOPY;  Service: Endoscopy;  Laterality: N/A;  . ESOPHAGOGASTRODUODENOSCOPY (EGD) WITH  PROPOFOL N/A 01/16/2018   Procedure: ESOPHAGOGASTRODUODENOSCOPY (EGD) WITH PROPOFOL;  Surgeon: Manya Silvas, MD;  Location: [facility] ENDOSCOPY;  Service: Endoscopy;  Laterality: N/A;  . EYE SURGERY    . GALLBLADDER SURGERY  2002  . JOINT REPLACEMENT Right 2018  . KNEE ARTHROSCOPY     left   . ROTATOR CUFF REPAIR  2001   left   . TONSILLECTOMY    . TOTAL KNEE ARTHROPLASTY Right 06/20/2017   Procedure: RIGHT TOTAL KNEE ARTHROPLASTY;  Surgeon: Gaynelle Arabian, MD;  Location: [facility];  Service: Orthopedics;   Laterality: Right;    Prior to Admission medications   Medication Sig Start Date End Date Taking? Authorizing Provider  amiodarone (PACERONE) 200 MG tablet TAKE 1 TABLET (200 MG TOTAL) BY MOUTH DAILY. Patient taking differently: Take 200 mg by mouth daily.  05/10/17  Yes Vaughan Basta, MD  aspirin 81 MG tablet Take 81 mg by mouth daily.   Yes [provider]  CINNAMON PO Take 1,000 mg by mouth 2 (two) times daily.    Yes [provider]  cyclobenzaprine (FLEXERIL) 10 MG tablet Take 10 mg by mouth 3 (three) times daily.    Yes [provider]  dutasteride (AVODART) 0.5 MG capsule Take 1 capsule (0.5 mg total) by mouth daily. 08/17/17  Yes Jerrol Banana., MD  esomeprazole (NEXIUM) 40 MG capsule TAKE 1 CAPSULE TWICE DAILY 03/07/18  Yes Jerrol Banana., MD  fexofenadine (ALLEGRA) 180 MG tablet Take 180 mg by mouth daily.     Yes [provider]  furosemide (LASIX) 40 MG tablet Take 1 tablet (40 mg total) by mouth daily. 12/30/17  Yes Jerrol Banana., MD  glucose blood (ONE TOUCH ULTRA TEST) test strip CHECK SUGAR TWICE DAILY. DX- E11.8 11/28/17  Yes Jerrol Banana., MD  hydrochlorothiazide (HYDRODIURIL) 12.5 MG tablet TAKE 2 TABLETS (25 MG TOTAL) BY MOUTH AT BEDTIME. 03/13/18  Yes Jerrol Banana., MD  insulin aspart (NOVOLOG FLEXPEN) 100 UNIT/ML FlexPen INJECT 14 UNITS 3 TIMES A   DAY (MORNING, NOON, & NIGHT) Patient taking differently: Inject 12 Units into the skin 3 (three) times daily with meals.  09/29/17  Yes Jerrol Banana., MD  Insulin Pen Needle (PEN NEEDLES) 31G X 5 MM MISC 1 each by Does not apply route daily. PATIENT NEEDS NOVA TWIST NEEDLES 5 MM  DX E11.9 10/18/16  Yes Jerrol Banana., MD  JANUVIA 100 MG tablet TAKE 1 TABLET DAILY 12/15/17  Yes Jerrol Banana., MD  LANTUS SOLOSTAR 100 UNIT/ML Solostar Pen Inject 30 Units into the skin 2 (two) times daily. 06/09/17  Yes Jerrol Banana., MD    liraglutide (VICTOZA) 18 MG/3ML SOPN INJECT 0.3ML (=1.8MG ) INTO THE SKIN DAILY 11/02/17  Yes Jerrol Banana., MD  magnesium oxide (MAG-OX) 400 MG tablet TAKE 1 TABLET (400 MG TOTAL) BY MOUTH 2 (TWO) TIMES DAILY. 04/07/18  Yes Jerrol Banana., MD  metFORMIN (GLUCOPHAGE) 1000 MG tablet TAKE 1 TABLET (1,000 MG TOTAL) BY MOUTH 2 (TWO) TIMES DAILY. 07/13/17  Yes Jerrol Banana., MD  metoprolol tartrate (LOPRESSOR) 100 MG tablet TAKE 1 TABLET (100 MG TOTAL) BY MOUTH 2 (TWO) TIMES DAILY. 02/19/18  Yes Jerrol Banana., MD  Multiple Vitamins-Minerals (CENTRUM SILVER PO) Take 1 tablet by mouth daily.    Yes [provider]  NOVOTWIST 32G X 5 MM MISC USE 6 TIMES A DAY DX E11.9 04/10/18  Yes Jerrol Banana., MD  Omega-3 Fatty Acids (FISH OIL) 1200 MG CAPS Take 1,200 mg by mouth 2 (two) times daily.    Yes [provider]  oxyCODONE (OXYCONTIN) 40 mg 12 hr tablet Take 1 tablet (40 mg total) by mouth every 8 (eight) hours as needed. Patient taking differently: Take 40 mg by mouth every 8 (eight) hours.  02/23/18  Yes Jerrol Banana., MD  pregabalin (LYRICA) 200 MG capsule Take 200 mg by mouth 2 (two) times daily with breakfast and lunch.   Yes [provider]  pregabalin (LYRICA) 300 MG capsule qhs 03/08/18  Yes Jerrol Banana., MD  ranitidine (ZANTAC) 300 MG tablet Take 1 tablet (300 mg total) by mouth daily. 01/18/18  Yes Jerrol Banana., MD  sucralfate (CARAFATE) 1 g tablet TAKE 1 TABLET (1 G TOTAL) BY MOUTH 4 (FOUR) TIMES DAILY - WITH MEALS AND AT BEDTIME. 02/13/18  Yes Jerrol Banana., MD  Testosterone Cypionate (DEPO-TESTOSTERONE IM) Inject into the muscle every 21 ( twenty-one) days.   Yes [provider]    Allergies Carisoprodol  Family History  Problem Relation Age of Onset  . Heart attack Brother     Social History Social History   Tobacco Use  . Smoking status: Former Smoker    Packs/day: 2.00     Years: 20.00    Pack years: 40.00    Types: Cigarettes    Last attempt to quit: 12/26/1989    Years since quitting: 28.3  . Smokeless tobacco: Never Used  Substance Use Topics  . Alcohol use: No  . Drug use: No    Review of Systems  Constitutional: Positive for fever/chills. Eyes: No visual changes. ENT: No sore throat. Cardiovascular: Positive for chest tightness. Respiratory: Positive for cough and shortness of breath. Gastrointestinal: No abdominal pain.  No nausea, no vomiting.  No diarrhea.  No constipation. Genitourinary: Negative for dysuria. Musculoskeletal: Positive for back pain. Skin: Negative for rash. Neurological: Negative for headaches, focal weakness or numbness.   ____________________________________________   PHYSICAL EXAM:  VITAL SIGNS: ED Triage Vitals  Enc Vitals Group     BP 04/22/18 0243 140/61     Pulse Rate 04/22/18 0243 (!) 127     Resp 04/22/18 0243 (!) 24     Temp 04/22/18 0243 (!) 103.1 F (39.5 C)     Temp Source 04/22/18 0243 Oral     SpO2 04/22/18 0243 (!) 81 %     Weight --      Height 04/22/18 0242 5\' 11"  (1.803 m)     Head Circumference --      Peak Flow --      Pain Score 04/22/18 0248 10     Pain Loc --      Pain Edu? --      Excl. in [place]? --     Constitutional: Alert and oriented.  Ill appearing and in moderate acute distress. Eyes: Conjunctivae are normal. PERRL. EOMI. Head: Atraumatic. Nose: No congestion/rhinnorhea. Mouth/Throat: Mucous membranes are moist.  Oropharynx non-erythematous. Neck: No stridor.   Cardiovascular: Tachycardic rate, regular rhythm. Grossly normal heart sounds.  Good peripheral circulation. Respiratory: Increased respiratory effort.  No retractions. Lungs with rhonchi and rales right worse than left. Gastrointestinal: Soft and nontender. No distention. No abdominal bruits. No CVA tenderness. Musculoskeletal: No lower extremity tenderness. 3+ nonpitting BLE edema.  No joint effusions. Neurologic:   Normal speech and language. No gross focal neurologic deficits are  appreciated. No gait instability. Skin:  Skin is hot, dry and intact. No rash noted.  No petechiae. Psychiatric: Mood and affect are normal. Speech and behavior are normal.  ____________________________________________   LABS (all labs ordered are listed, but only abnormal results are displayed)  Labs Reviewed  COMPREHENSIVE METABOLIC PANEL - Abnormal; Notable for the following components:      Result Value   Chloride 98 (*)    Glucose, Bld 359 (*)    BUN 26 (*)    Creatinine, Ser 1.42 (*)    AST 45 (*)    GFR calc non Af Amer 49 (*)    GFR calc Af Amer 57 (*)    All other components within normal limits  CBC WITH DIFFERENTIAL/PLATELET - Abnormal; Notable for the following components:   WBC 13.5 (*)    Hemoglobin 12.9 (*)    HCT 39.7 (*)    MCV 75.6 (*)    MCH 24.5 (*)    RDW 17.9 (*)    Platelets 133 (*)    Neutro Abs 11.8 (*)    Lymphs Abs 0.9 (*)    All other components within normal limits  LACTIC ACID, PLASMA - Abnormal; Notable for the following components:   Lactic Acid, Venous 2.9 (*)    All other components within normal limits  LACTIC ACID, PLASMA - Abnormal; Notable for the following components:   Lactic Acid, Venous 2.0 (*)    All other components within normal limits  BLOOD GAS, ARTERIAL - Abnormal; Notable for the following components:   Bicarbonate 28.5 (*)    Acid-Base Excess 3.2 (*)    All other components within normal limits  URINALYSIS, COMPLETE (UACMP) WITH MICROSCOPIC - Abnormal; Notable for the following components:   Color, Urine YELLOW (*)    APPearance CLEAR (*)    Glucose, UA >=500 (*)    Ketones, ur 5 (*)    All other components within normal limits  GLUCOSE, CAPILLARY - Abnormal; Notable for the following components:   Glucose-Capillary 264 (*)    All other components within normal limits  CULTURE, BLOOD (ROUTINE X 2)  CULTURE, BLOOD (ROUTINE X 2)  URINE CULTURE    CULTURE, EXPECTORATED SPUTUM-ASSESSMENT  MRSA PCR SCREENING  BRAIN NATRIURETIC PEPTIDE  TROPONIN I  INFLUENZA PANEL BY PCR (TYPE A & B)  HIV ANTIBODY (ROUTINE TESTING)  STREP PNEUMONIAE URINARY ANTIGEN  PROCALCITONIN  LEGIONELLA PNEUMOPHILA SEROGP 1 UR AG  MAGNESIUM  PHOSPHORUS  TROPONIN I   ____________________________________________  EKG  ED ECG REPORT I, SUNG,JADE J, the attending physician, personally viewed and interpreted this ECG.   Date: 04/22/2018  EKG Time: 0252  Rate: 125  Rhythm: sinus tachycardia  Axis: Normal  Intervals:nonspecific intraventricular conduction delay  ST&T Change: Nonspecific  ____________________________________________  RADIOLOGY  ED MD interpretation: Right lower lobe pneumonia  Official radiology report(s): Dg Chest Port 1 View  Result Date: 04/22/2018 CLINICAL DATA:  Fever and left-sided back and flank pain. History of pneumonia. Cough and shortness of breath. EXAM: PORTABLE CHEST 1 VIEW COMPARISON:  07/13/2017 FINDINGS: Shallow inspiration with atelectasis in the lung bases. Mild cardiac enlargement. No vascular congestion or edema. Slight infiltration in the right lung base may reflect pneumonia. No blunting of costophrenic angles. No pneumothorax. Postoperative changes in the cervical spine. IMPRESSION: Cardiac enlargement. Focal infiltration in the right lung base may indicate pneumonia. Electronically Signed   By: Lucienne Capers M.D.   On: 04/22/2018 03:32    ____________________________________________   PROCEDURES  Procedure(s)  performed: None  Procedures  Critical Care performed: Yes, see critical care note(s)   CRITICAL CARE Performed by: Paulette Blanch   Total critical care time: 45 minutes  Critical care time was exclusive of separately billable procedures and treating other patients.  Critical care was necessary to treat or prevent imminent or life-threatening deterioration.  Critical care was time spent  personally by me on the following activities: development of treatment plan with patient and/or surrogate as well as nursing, discussions with consultants, evaluation of patient's response to treatment, examination of patient, obtaining history from patient or surrogate, ordering and performing treatments and interventions, ordering and review of laboratory studies, ordering and review of radiographic studies, pulse oximetry and re-evaluation of patient's condition.  ____________________________________________   INITIAL IMPRESSION / ASSESSMENT AND PLAN / ED COURSE  As part of my medical decision making, I reviewed the following data within the [place] History obtained from family, Nursing notes reviewed and incorporated, Labs reviewed, EKG interpreted, Old chart reviewed, Radiograph reviewed, Discussed with admitting physician and Notes from prior ED visits   67 year old male who presents with fever, cough, shortness of breath. Differential includes, but is not limited to, viral syndrome, bronchitis including COPD exacerbation, pneumonia, reactive airway disease including asthma, CHF including exacerbation with or without pulmonary/interstitial edema, pneumothorax, ACS, thoracic trauma, and pulmonary embolism.  ED code sepsis initiated upon patient's arrival to the treatment room.  He is placed on nonrebreather oxygen with saturations 98%.  Patient's legs appear quite edematous although he denies a history of CHF.  His total fluid volume for sepsis would be 4 L; for now I will administer 2 L.  If the lactate returns elevated, will administer the other 2 L.  Antibiotics ordered for community-acquired pneumonia.  Anticipate hospitalization.   Clinical Course as of Apr 22 658  Sat Apr 22, 2018  0346 Lactate noted.  Will give the additional 2 L normal saline bolus.  Patient is less tachycardic.  Appears slightly more comfortable on nonrebreather oxygen.  Discussed with hospitalist  who will evaluate patient in the emergency department for admission.   [JS]  6010 Very low dose fentanyl ordered for patient's chronic back pain.   [JS]    Clinical Course User Index [JS] Paulette Blanch, MD     ____________________________________________   FINAL CLINICAL IMPRESSION(S) / ED DIAGNOSES  Final diagnoses:  Fever, unspecified fever cause  Sepsis, due to unspecified organism [facility])  Community acquired pneumonia of right lower lobe of lung ([place])  Hyperglycemia  Renal insufficiency     ED Discharge Orders    None       Note:  This document was prepared using Dragon voice recognition software and may include unintentional dictation errors.    Paulette Blanch, MD 04/22/18 [phone]

## 2018-04-22 NOTE — Progress Notes (Addendum)
Pharmacy Antibiotic Note  Jerry Parsons is a 67 y.o. male admitted on 04/22/2018 with bacteremia.  Pharmacy has been consulted for vancomycin dosing.  Plan: Vancomycin vancomycin 1250mg  IV every 12 hours.  Goal trough 15-20 mcg/mL.  T1/2 11.55 hrs estimated  4/29@1230  vancomycin trough before 4th level    Height: 5\' 11"  (180.3 cm) Weight: 276 lb 7.3 oz (125.4 kg) IBW/kg (Calculated) : 75.3  Temp (24hrs), Avg:99.5 F (37.5 C), Min:97.2 F (36.2 C), Max:103.1 F (39.5 C)  Recent Labs  Lab 04/22/18 0254 04/22/18 0447  WBC 13.5*  --   CREATININE 1.42*  --   LATICACIDVEN 2.9* 2.0*    Estimated Creatinine Clearance: 67.1 mL/min (A) (by C-G formula based on SCr of 1.42 mg/dL (H)).    Allergies  Allergen Reactions  . Carisoprodol Itching    Antimicrobials this admission: Anti-infectives (From admission, onward)   Start     Dose/Rate Route Frequency Ordered Stop   04/23/18 0100  vancomycin (VANCOCIN) 1,250 mg in sodium chloride 0.9 % 250 mL IVPB     1,250 mg 166.7 mL/hr over 90 Minutes Intravenous Every 12 hours 04/22/18 2028     04/22/18 2200  cefTRIAXone (ROCEPHIN) 1 g in sodium chloride 0.9 % 100 mL IVPB  Status:  Discontinued     1 g 200 mL/hr over 30 Minutes Intravenous Every 24 hours 04/22/18 0535 04/22/18 0743   04/22/18 2015  vancomycin (VANCOCIN) 2,000 mg in sodium chloride 0.9 % 500 mL IVPB     2,000 mg 250 mL/hr over 120 Minutes Intravenous  Once 04/22/18 2000     04/22/18 1800  cefTRIAXone (ROCEPHIN) 1 g in sodium chloride 0.9 % 100 mL IVPB     1 g 200 mL/hr over 30 Minutes Intravenous Every 24 hours 04/22/18 0743     04/22/18 1800  doxycycline (VIBRAMYCIN) 100 mg in sodium chloride 0.9 % 250 mL IVPB     100 mg 125 mL/hr over 120 Minutes Intravenous Every 12 hours 04/22/18 0743     04/22/18 1500  doxycycline (VIBRAMYCIN) 100 mg in sodium chloride 0.9 % 250 mL IVPB  Status:  Discontinued     100 mg 125 mL/hr over 120 Minutes Intravenous Every 12 hours 04/22/18  0458 04/22/18 0743   04/22/18 0300  cefTRIAXone (ROCEPHIN) 2 g in sodium chloride 0.9 % 100 mL IVPB  Status:  Discontinued     2 g 200 mL/hr over 30 Minutes Intravenous Every 24 hours 04/22/18 0255 04/22/18 0535   04/22/18 0300  azithromycin (ZITHROMAX) 500 mg in sodium chloride 0.9 % 250 mL IVPB  Status:  Discontinued     500 mg 250 mL/hr over 60 Minutes Intravenous Every 24 hours 04/22/18 0255 04/22/18 0458       Microbiology results: Recent Results (from the past 240 hour(s))  Blood Culture (routine x 2)     Status: None (Preliminary result)   Collection Time: 04/22/18  2:54 AM  Result Value Ref Range Status   Specimen Description BLOOD BLOOD RIGHT HAND  Final   Special Requests   Final    BOTTLES DRAWN AEROBIC AND ANAEROBIC Blood Culture adequate volume   Culture   Final    NO GROWTH < 12 HOURS Performed at [facility], [address] [place], [place] [address]    Report Status PENDING  Incomplete  Blood Culture (routine x 2)     Status: None (Preliminary result)   Collection Time: 04/22/18  2:54 AM  Result Value Ref Range Status  Specimen Description BLOOD RIGHT ANTECUBITAL  Final   Special Requests   Final    BOTTLES DRAWN AEROBIC AND ANAEROBIC Blood Culture adequate volume   Culture  Setup Time   Final    Organism ID to follow ANAEROBIC BOTTLE ONLY GRAM POSITIVE COCCI IN CLUSTERS CRITICAL RESULT CALLED TO, READ BACK BY AND VERIFIED WITH: [place] 04/22/18.PMH Performed at [facility], [address] [place], [place] [address]    Culture GRAM POSITIVE COCCI IN CLUSTERS  Final   Report Status PENDING  Incomplete  Blood Culture ID Panel (Reflexed)     Status: Abnormal   Collection Time: 04/22/18  2:54 AM  Result Value Ref Range Status   Enterococcus species NOT DETECTED NOT DETECTED Final   Listeria monocytogenes NOT DETECTED NOT DETECTED Final   Staphylococcus species DETECTED (A) NOT DETECTED Final    Comment: CRITICAL RESULT  CALLED TO, READ BACK BY AND VERIFIED WITH: Baleria Wyman AT 1956 04/22/18.PMH    Staphylococcus aureus DETECTED (A) NOT DETECTED Final    Comment: Methicillin (oxacillin)-resistant Staphylococcus aureus (MRSA). MRSA is predictably resistant to beta-lactam antibiotics (except ceftaroline). Preferred therapy is vancomycin unless clinically contraindicated. Patient requires contact precautions if  hospitalized. CRITICAL RESULT CALLED TO, READ BACK BY AND VERIFIED WITH: Arieon Scalzo AT 1956 04/22/18.PMH    Methicillin resistance DETECTED (A) NOT DETECTED Final    Comment: CRITICAL RESULT CALLED TO, READ BACK BY AND VERIFIED WITH: Aydian Dimmick AT 1956 04/22/18.PMH    Streptococcus species NOT DETECTED NOT DETECTED Final   Streptococcus agalactiae NOT DETECTED NOT DETECTED Final   Streptococcus pneumoniae NOT DETECTED NOT DETECTED Final   Streptococcus pyogenes NOT DETECTED NOT DETECTED Final   Acinetobacter baumannii NOT DETECTED NOT DETECTED Final   Enterobacteriaceae species NOT DETECTED NOT DETECTED Final   Enterobacter cloacae complex NOT DETECTED NOT DETECTED Final   Escherichia coli NOT DETECTED NOT DETECTED Final   Klebsiella oxytoca NOT DETECTED NOT DETECTED Final   Klebsiella pneumoniae NOT DETECTED NOT DETECTED Final   Proteus species NOT DETECTED NOT DETECTED Final   Serratia marcescens NOT DETECTED NOT DETECTED Final   Haemophilus influenzae NOT DETECTED NOT DETECTED Final   Neisseria meningitidis NOT DETECTED NOT DETECTED Final   Pseudomonas aeruginosa NOT DETECTED NOT DETECTED Final   Candida albicans NOT DETECTED NOT DETECTED Final   Candida glabrata NOT DETECTED NOT DETECTED Final   Candida krusei NOT DETECTED NOT DETECTED Final   Candida parapsilosis NOT DETECTED NOT DETECTED Final   Candida tropicalis NOT DETECTED NOT DETECTED Final    Comment: Performed at [facility], [address] [place], [place] [address]  MRSA PCR Screening     Status: None    Collection Time: 04/22/18  5:53 AM  Result Value Ref Range Status   MRSA by PCR NEGATIVE NEGATIVE Final    Comment:        The GeneXpert MRSA Assay (FDA approved for NASAL specimens only), is one component of a comprehensive MRSA colonization surveillance program. It is not intended to diagnose MRSA infection nor to guide or monitor treatment for MRSA infections. Performed at [facility], [address] [place], [place] [address]      Thank you for allowing pharmacy to be a part of this patient's care.  Donna Christen Sheina Mcleish 04/22/2018 8:28 PM

## 2018-04-22 NOTE — H&P (Signed)
[facility] NAME: Jerry Parsons    MR#:  [id number]  DATE OF BIRTH:  October 16, 1951  DATE OF ADMISSION:  04/22/2018  PRIMARY CARE PHYSICIAN: Jerrol Banana., MD   REQUESTING/REFERRING PHYSICIAN: Paulette Blanch, MD  CHIEF COMPLAINT:   Chief Complaint  Patient presents with  . Fever    HISTORY OF PRESENT ILLNESS:  Jerry Parsons  is a 67 y.o. 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 Pt is accompanied by his wife at bedside. Pt has a known Hx of aspiration, for which he is followed by his PCP (Dr. [name]). He has been hospitalized with pneumonia ~9x in the past, and his wife tells me the present admission is his tenth such admission. On his last admission, he was evaluated and managed by SLP, but the pt's wife tells me he did not receive any outpt SLP services after discharge. Pt endorses 2d Hx of cough, intermittently productive of clear/white sputum, as well as SOB, both progressively worsening. He endorses a 3-4d Hx of progressively worsening back pain, as well as mild R-sided chest discomfort. Overnight (Friday 04/21/2018), pt's wife states she woke up at ~0200AM to find that the thermostat was turned to 29F, and the pt was wearing jeans and shoes under his pajamas. He endorsed chills, but denied fever. He appears diaphoretic, ill-appearing and restless at the time of my Hx/examination, but is in no acute respiratory distress on 100% NRB mask. He is alert, responds appropriately to questions and commands, and is not combative or agitated, however he is does at times pull at lines/tubes/devices and interferes with his nursing management. (-) N/V/D/AP, palpitations, rigors, hemoptysis, wheezing, HA, blurred vision,  vertigo, LH, LOC, urinary symptoms.  PAST MEDICAL HISTORY:   Past Medical History:  Diagnosis Date  . Arrhythmia    tachycardia, A-Fib  . Arthritis   . Blind right eye   . BPH (benign prostatic hyperplasia)   . Diabetes mellitus   . DVT (deep venous thrombosis) ([place]) 02/2010   leg thrombus ; dislodged into emboli and caused PE  . Dyspnea   . Dysrhythmia   . 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 GERD (gastroesophageal reflux disease)   . Hypercholesteremia   . Hypertension   . Kidney failure   . Neuromuscular disorder ([place])   . Neuropathy   . Pneumonia    time 9 ;last episode 12/2015  . Pulmonary embolus ([place]) 2011  . Seasonal allergies   . Seizures ([place])    as child   . Sleep apnea    BIPAP  . Stiff neck    limited turning s/p titanium plate placement  . Stroke ([place])   . TIA (transient ischemic attack)   . Wears dentures    full upper and lower    PAST SURGICAL HISTORY:   Past Surgical History:  Procedure Laterality Date  . APPENDECTOMY    . BACK SURGERY     x 8; upper x 3 & lower x 5  . CARDIOVERSION  03/14/13, 10/16   2014 - [place], 2016 - [place]  . CATARACT EXTRACTION W/PHACO Left 10/29/2015   Procedure: CATARACT EXTRACTION PHACO AND INTRAOCULAR LENS PLACEMENT (IOC);  Surgeon: Leandrew Koyanagi, MD;  Location: [facility];  Service: Ophthalmology;  Laterality: Left;  DIABETIC - insulin and oral medsSleep apnea - no machine  . CHOLECYSTECTOMY    . COLONOSCOPY WITH PROPOFOL N/A 01/16/2018   Procedure: COLONOSCOPY WITH PROPOFOL;  Surgeon: Manya Silvas, MD;  Location[facility]COPY;  Service: Endoscopy;  Laterality: N/A;  . ESOPHAGOGASTRODUODENOSCOPY (EGD) WITH PROPOFOL N/A 01/16/2018   Procedure: ESOPHAGOGASTRODUODENOSCOPY (EGD) WITH PROPOFOL;  Surgeon: Manya Silvas, MD;  Location: [facility] ENDOSCOPY;  Service: Endoscopy;  Laterality: N/A;  . EYE SURGERY    . GALLBLADDER SURGERY  2002  . JOINT REPLACEMENT Right 2018  . KNEE ARTHROSCOPY      left   . ROTATOR CUFF REPAIR  2001   left   . TONSILLECTOMY    . TOTAL KNEE ARTHROPLASTY Right 06/20/2017   Procedure: RIGHT TOTAL KNEE ARTHROPLASTY;  Surgeon: Gaynelle Arabian, MD;  Location: [facility];  Service: Orthopedics;  Laterality: Right;    SOCIAL HISTORY:   Social History   Tobacco Use  . Smoking status: Former Smoker    Packs/day: 2.00    Years: 20.00    Pack years: 40.00    Types: Cigarettes    Last attempt to quit: 12/26/1989    Years since quitting: 28.3  . Smokeless tobacco: Never Used  Substance Use Topics  . Alcohol use: No    FAMILY HISTORY:   Family History  Problem Relation Age of Onset  . Heart attack Brother     DRUG ALLERGIES:   Allergies  Allergen Reactions  . Carisoprodol Itching    REVIEW OF SYSTEMS:   Review of Systems  Constitutional: Positive for chills, diaphoresis and malaise/fatigue. Negative for fever and weight loss.  HENT: Negative for congestion, hearing loss, sinus pain, sore throat and tinnitus.   Eyes: Negative for blurred vision, double vision and photophobia.  Respiratory: Positive for cough, sputum production and shortness of breath. Negative for hemoptysis and wheezing.   Cardiovascular: Positive for chest pain. Negative for palpitations, orthopnea, claudication, leg swelling and PND.  Gastrointestinal: Positive for constipation. Negative for abdominal pain, blood in stool, diarrhea, heartburn, melena, nausea and vomiting.  Genitourinary: Negative for dysuria, frequency, hematuria and urgency.  Musculoskeletal: Positive for back pain. Negative for joint pain, myalgias and neck pain.  Skin: Negative for itching and rash.  Neurological: Positive for weakness (+) generalized weakness. Negative for dizziness, tingling, tremors, sensory change, speech change, focal weakness, seizures, loss of consciousness and headaches.    MEDICATIONS AT HOME:   Prior to Admission medications   Medication Sig Start Date End Date Taking?  Authorizing Provider  amiodarone (PACERONE) 200 MG tablet TAKE 1 TABLET (200 MG TOTAL) BY MOUTH DAILY. Patient taking differently: Take 200 mg by mouth daily. TAKE 1 TABLET (200 MG TOTAL) BY MOUTH DAILY. 05/10/17   Vaughan Basta, MD  aspirin 81 MG tablet Take 81 mg by mouth daily.    [provider]  atorvastatin (LIPITOR) 40 MG tablet TAKE 1 TABLET (40 MG TOTAL) BY MOUTH DAILY AT 6 PM. Patient not taking: Reported on 03/03/2018 09/15/17   Jerrol Banana., MD  budesonide (RHINOCORT AQUA) 32 MCG/ACT nasal spray Place 1 spray daily as needed into both nostrils (for allergies.). Patient not taking: Reported on 03/03/2018 11/03/17   Jerrol Banana., MD  CINNAMON PO Take 1,200 mg by mouth 2 (two) times daily.    [provider]  cyclobenzaprine (FLEXERIL) 10 MG tablet Take 10 mg by mouth 3 (three) times daily as needed  for muscle spasms.    [provider]  dutasteride (AVODART) 0.5 MG capsule Take 1 capsule (0.5 mg total) by mouth daily. 08/17/17   Jerrol Banana., MD  esomeprazole (NEXIUM) 40 MG capsule TAKE 1 CAPSULE TWICE DAILY 03/07/18   Jerrol Banana., MD  fexofenadine (ALLEGRA) 180 MG tablet Take 180 mg by mouth daily.      [provider]  furosemide (LASIX) 40 MG tablet Take 1 tablet (40 mg total) by mouth daily. 12/30/17   Jerrol Banana., MD  gabapentin (NEURONTIN) 800 MG tablet Take 800 mg by mouth 3 (three) times daily.    [provider]  glucose blood (ONE TOUCH ULTRA TEST) test strip CHECK SUGAR TWICE DAILY. DX- E11.8 11/28/17   Jerrol Banana., MD  hydrochlorothiazide (HYDRODIURIL) 12.5 MG tablet TAKE 2 TABLETS (25 MG TOTAL) BY MOUTH AT BEDTIME. 03/13/18   Jerrol Banana., MD  insulin aspart (NOVOLOG FLEXPEN) 100 UNIT/ML FlexPen INJECT 14 UNITS 3 TIMES A   DAY (MORNING, NOON, & NIGHT) 09/29/17   Jerrol Banana., MD  Insulin Pen Needle (PEN NEEDLES) 31G X 5 MM MISC 1 each by Does not apply  route daily. PATIENT NEEDS NOVA TWIST NEEDLES 5 MM  DX E11.9 10/18/16   Jerrol Banana., MD  JANUVIA 100 MG tablet TAKE 1 TABLET DAILY 12/15/17   Jerrol Banana., MD  LANTUS SOLOSTAR 100 UNIT/ML Solostar Pen Inject 30 Units into the skin 2 (two) times daily. Patient taking differently: Inject 35 Units into the skin 2 (two) times daily.  06/09/17   Jerrol Banana., MD  liraglutide (VICTOZA) 18 MG/3ML SOPN INJECT 0.3ML (=1.8MG ) INTO THE SKIN DAILY 11/02/17   Jerrol Banana., MD  magnesium oxide (MAG-OX) 400 MG tablet TAKE 1 TABLET (400 MG TOTAL) BY MOUTH 2 (TWO) TIMES DAILY. 04/07/18   Jerrol Banana., MD  metFORMIN (GLUCOPHAGE) 1000 MG tablet TAKE 1 TABLET (1,000 MG TOTAL) BY MOUTH 2 (TWO) TIMES DAILY. Patient not taking: Reported on 03/03/2018 07/13/17   Jerrol Banana., MD  metoprolol tartrate (LOPRESSOR) 100 MG tablet TAKE 1 TABLET (100 MG TOTAL) BY MOUTH 2 (TWO) TIMES DAILY. 02/19/18   Jerrol Banana., MD  Multiple Vitamins-Minerals (CENTRUM SILVER PO) Take by mouth daily.    [provider]  NON FORMULARY CPAP    [provider]  NOVOTWIST 32G X 5 MM MISC USE 6 TIMES A DAY DX E11.9 04/10/18   Jerrol Banana., MD  Omega-3 Fatty Acids (FISH OIL) 1200 MG CAPS Take 1,200 mg by mouth daily.    [provider]  oxyCODONE (OXYCONTIN) 40 mg 12 hr tablet Take 1 tablet (40 mg total) by mouth every 8 (eight) hours as needed. 02/23/18   Jerrol Banana., MD  oxyCODONE (OXYCONTIN) 40 mg 12 hr tablet Take 1 tablet (40 mg total) by mouth every 8 (eight) hours as needed. 03/14/18   Jerrol Banana., MD  pregabalin (LYRICA) 300 MG capsule qhs 03/08/18   Jerrol Banana., MD  ranitidine (ZANTAC) 300 MG tablet Take 1 tablet (300 mg total) by mouth daily. 01/18/18   Jerrol Banana., MD  sucralfate (CARAFATE) 1 g tablet TAKE 1 TABLET (1 G TOTAL) BY MOUTH 4 (FOUR) TIMES DAILY - WITH MEALS AND AT BEDTIME. 02/13/18   Jerrol Banana., MD  Testosterone Cypionate (DEPO-TESTOSTERONE IM) Inject into the  muscle every 21 ( twenty-one) days.    [provider]      VITAL SIGNS:  Blood pressure (!) 148/72, pulse (!) 108, temperature (!) 103.1 F (39.5 C), temperature source Oral, resp. rate 20, height 5\' 11"  (1.803 m), SpO2 97 %.  PHYSICAL EXAMINATION:  Physical Exam  Constitutional: He appears well-developed and well-nourished. He is cooperative. He appears toxic. He does not have a sickly appearance. He appears ill. No distress.  HENT:  Head: Normocephalic and atraumatic.  Mouth/Throat: No oropharyngeal exudate.  Eyes: Conjunctivae, EOM and lids are normal. No scleral icterus.  Neck: Neck supple. No JVD present.  Cardiovascular: Regular rhythm, S1 normal, S2 normal and normal heart sounds.  No extrasystoles are present. Tachycardia present. Exam reveals no gallop, no S3, no S4, no distant heart sounds and no friction rub.  No murmur heard. Pulmonary/Chest: Effort normal. No stridor. No respiratory distress. He has decreased breath sounds in the right upper field, the right middle field, the right lower field, the left upper field, the left middle field and the left lower field. He has no wheezes. He has rhonchi in the right lower field. He exhibits no tenderness.  Diffusely diminished breath sounds B/L across all lung fields. R lung base fine crackles. L lung CTA. (-) wheezing.  Abdominal: Soft. Bowel sounds are normal. He exhibits no distension. There is no tenderness. There is no rebound and no guarding.  Musculoskeletal: He exhibits edema (+) 1-2+ B/L LE edema.  Lymphadenopathy:    He has no cervical adenopathy.  Neurological: He is alert.  Skin: Skin is warm. He is diaphoretic. No erythema. No pallor.  Psychiatric: He has a normal mood and affect. His mood appears not anxious. His affect is not angry, not blunt, not labile and not inappropriate. His speech is tangential. His speech is not rapid  and/or pressured, not delayed and not slurred. He is not agitated, not aggressive, not hyperactive, not slowed, not withdrawn, not actively hallucinating and not combative. Thought content is not paranoid and not delusional. He does not exhibit a depressed mood. He is communicative. He is inattentive.    LABORATORY PANEL:   CBC Recent Labs  Lab 04/22/18 0254  WBC 13.5*  HGB 12.9*  HCT 39.7*  PLT 133*   ------------------------------------------------------------------------------------------------------------------  Chemistries  Recent Labs  Lab 04/22/18 0254  NA 136  K 3.6  CL 98*  CO2 29  GLUCOSE 359*  BUN 26*  CREATININE 1.42*  CALCIUM 9.5  AST 45*  ALT 35  ALKPHOS 110  BILITOT 0.6   ------------------------------------------------------------------------------------------------------------------  Cardiac Enzymes Recent Labs  Lab 04/22/18 0254  TROPONINI <0.03   ------------------------------------------------------------------------------------------------------------------  RADIOLOGY:  Dg Chest Port 1 View  Result Date: 04/22/2018 CLINICAL DATA:  Fever and left-sided back and flank pain. History of pneumonia. Cough and shortness of breath. EXAM: PORTABLE CHEST 1 VIEW COMPARISON:  07/13/2017 FINDINGS: Shallow inspiration with atelectasis in the lung bases. Mild cardiac enlargement. No vascular congestion or edema. Slight infiltration in the right lung base may reflect pneumonia. No blunting of costophrenic angles. No pneumothorax. Postoperative changes in the cervical spine. IMPRESSION: Cardiac enlargement. Focal infiltration in the right lung base may indicate pneumonia. Electronically Signed   By: Lucienne Capers M.D.   On: 04/22/2018 03:32   IMPRESSION AND PLAN:   A/P: 74M recurrent aspiration pneumonia p/w sepsis/RLL pneumonia.  1.) Sepsis/RLL pneumonia: Pt p/w productive cough, SOB, chills, diaphoresis, as per HPI. Has Hx of recurrent Pna, w/ pt's wife  stating present  admission will be the tenth for this problem. SpO2 stable on NRB mask. (+) Fever (103.1), (+) leukocytosis (WBC 13.5), (+) tachycardia + tachypnea + hypoxia. SIRS (+). Exam (+) RLL fine crackles. Lactate 2.9. CXR (+) RLL infiltrate. Pt w/ sepsis 2/2 pneumonia, DDx acute bacterial community acquired pneumonia vs. aspiration pneumonia. Rcd Ceftriaxone + Azithromycin in ED, changed to Ceftriaxone + Doxycycline to cover for CAP, atypical pneumonia and aspiration pneumonia. BCx, sputum Cx, Rapid Flu, UStrep + ULegionella pending. Incentive spirometry, pulmonary toileting. Continuous pulse oximetry. SLP inpt evaluation, may also benefit from outpt SLP f/up.  2.) Cr elevation/CKD: Cr 1.42 on admission. Cr 1.39 as of 12/13/2017, 1.40 as of 06/16/2017. I suspect pt Cr is at baseline, pt likely w/ underlying CKD III (2/2 DM, HTN, aged kidney). I do not believe he is in AKI at the present time. Monitor BMP, avoid nephrotoxins.  3.) Hyperglycemia/glycosuria/T2IDDM: FSG 359. U/A (+) glycosuria. Pt w/ uncontrolled T2IDDM w/ neuropathy. C/w Lantus + TID prandial insulin. MSSI. Hold PO agents, Victoza.  4.) Microcytic anemia: Hgb 12.9, mild anemia. MCV 75.6. Possible iron deficiency anemia vs. anemia of chronic disease. Outpt f/up, w/up w/ iron studies.  5.) HTN: c/w Lasix, HCTZ, Lopressor.  6.) HLD/PVD/TIA/DVT/PE/PAF/CHF: c/w ASA, Amiodarone, beta blocker. Not on Statin, ACE-I/ARB or full-dose systemic anticoagulation.  7.) OSA: Uses CPAP qHS @ home. Held in setting of Pna, as inspissation of secretions may potentially adversely affect outcomes, though this is not well-studied.  8.) GERD: Protonix (FS home Nexium), Pepcid (FS home Zantac), c/w Carafate.  9.) BPH: c/w Avodart.  10.) Neuropathy/chronic pain: c/w Lyrica, Oxycontin.  11.) FEN/GI: Cardiac diabetic diet, Protonix + Pepcid + Carafate.  12.) DVT PPx: Lovenox 40mg  SQ qD.  13.) Code status: Full code.  14.) Disposition: Admission,  pt expected to stay > 2 midnights.   All the records are reviewed and case discussed with ED provider. Management plans discussed with the patient, family and they are in agreement.  CODE STATUS: Full code.  TOTAL TIME TAKING CARE OF THIS PATIENT: 90 minutes.    Arta Silence M.D on 04/22/2018 at 5:02 AM  Between 7am to 6pm - Pager - [phone]  After 6pm go to [email or web address] - password [email or web address] [facility]  [facility]  Office  [phone]  CC: Primary care physician; Jerrol Banana., MD   Note: This dictation was prepared with Dragon dictation along with smaller phrase technology. Any transcriptional errors that result from this process are unintentional.

## 2018-04-22 NOTE — ED Notes (Signed)
Pt restless, states has back pain. Bed adjusted for comfort. md notified. Pt remains flushed, but skin is cooler.

## 2018-04-22 NOTE — Progress Notes (Addendum)
      INFECTIOUS DISEASE ATTENDING:  Alerted to pt with MRSA bacteremia by BCID  WIll narrow to vancomycin and repeat bood cultures, check TTE see below  Will touch base with primary team in am.       [facility] Antimicrobial Management Team Staphylococcus aureus bacteremia   Staphylococcus aureus bacteremia (SAB) is associated with a high rate of complications and mortality.  Specific aspects of clinical management are critical to optimizing the outcome of patients with SAB.  Therefore, the [facility] Antimicrobial Management Team [facility]) has initiated an intervention aimed at improving the management of SAB at [facility].  To do so, Infectious Diseases physicians are providing an evidence-based consult for the management of all patients with SAB.     Yes No Comments  Perform follow-up blood cultures (even if the patient is afebrile) to ensure clearance of bacteremia [x]  []  Check cxs  Remove vascular catheter and obtain follow-up blood cultures after the removal of the catheter [x]  []  DO NOT PLACE PICC until we have sterilized blood  Perform echocardiography to evaluate for endocarditis (transthoracic ECHO is 40-50% sensitive, TEE is > 90% sensitive) [x]  []  Please keep in mind, that neither test can definitively EXCLUDE endocarditis, and that should clinical suspicion remain high for endocarditis the patient should then still be treated with an "endocarditis" duration of therapy = 6 weeks TTE ordered, will ask for TEE  Consult electrophysiologist to evaluate implanted cardiac device (pacemaker, ICD) []  []    Ensure source control [x]  []  Have all abscesses been drained effectively? Have deep seeded infections (septic joints or osteomyelitis) had appropriate surgical debridement?  Investigate for "metastatic" sites of infection [x]  []  Does the patient have ANY symptom or physical exam finding that would suggest a deeper infection (back or neck pain that may be suggestive of vertebral  osteomyelitis or epidural abscess, muscle pain that could be a symptom of pyomyositis)?  Keep in mind that for deep seeded infections MRI imaging with contrast is preferred rather than other often insensitive tests such as plain x-rays, especially early in a patient's presentation.  Change antibiotic therapy to vancomycin []  []  Beta-lactam antibiotics are preferred for MSSA due to higher cure rates.   If on Vancomycin, goal trough should be 15 - 20 mcg/mL  Estimated duration of IV antibiotic therapy:  4-6 weeks []  []  Consult case management for probably prolonged outpatient IV antibiotic therapy     Date: 04/22/2018  Patient name: Jerry Parsons  Medical record number: [id number]  Date of birth: 14-Mar-1951     Alcide Evener 04/22/2018, 11:05 PM

## 2018-04-22 NOTE — Therapy (Signed)
SLP Cancellation Note  Patient Details Name: Jerry Parsons MRN: [id number] DOB: 18-Aug-1951   Cancelled treatment:        SLP talked to nursing and MD. MD requests modified Barium Swallow and upon notification that it could not happen until Monday, stated she still wants pt to receive MBSS. SLP will hold orders until Monday. NSG states he did fine with meals and she did not notice any ssx aspiration. SLP notified to contact ST services if changes occur.    [place] Sauber 04/22/2018, 9:36 AM

## 2018-04-22 NOTE — ED Notes (Signed)
Per charge rn in ccu, hold pt at this time for order clarification.

## 2018-04-22 NOTE — ED Notes (Signed)
Pt placed on oxygen at 6lpm via [place] for pox of 78% on ra. Pt with rebound pox to 86% on [place]. Pt placed on non rebreather mask at 15lpm with rebound pox to 98% after arrival to room.

## 2018-04-22 NOTE — ED Notes (Signed)
Pt has approx 336mL left in ns bolus bag. hospitalist assessing pt at this time.

## 2018-04-22 NOTE — Consult Note (Signed)
Lucilla Lame, MD [facility]  [address], [place], [place] [address] Phone: [phone] Fax : [phone]  Consultation  Referring Provider:     Dr. Leslye Peer Primary Care Physician:  Jerrol Banana., MD Primary Gastroenterologist:  Dr. Vira Agar         Reason for Consultation:     Aspiration pneumonia  Date of Admission:  04/22/2018 Date of Consultation:  04/22/2018         HPI:   Jerry Parsons is a 67 y.o. male who was admitted with shortness of breath and was diagnosed with possible aspiration pneumonia.  Has a history of being followed by Dr. Vira Agar in the past and has had a colonoscopy in 2014 and a EGD and colonoscopy a few months ago in January.  The patient has a history of polyps and the wife reports that the patient has been hospitalized 9 other times besides this 1 for aspiration pneumonia.  The patient was evaluated sometime ago by speech pathology for possible aspiration but she states he has not had any recent evaluation.  The patient was dilated with a 17 mm dilator in January.  The patient denies having any food coming up into his mouth or choking after he eats.  The patient also reports that since he was started on his BiPAP he has not been having any episodes of aspiration until recently when he was sleeping in a chair not with his BiPAP.  The patient reports that he has been having a temperature of 103 at home and his wife woke up and found him fully dressed and closed because he was cold.  The patient was admitted and was found to have diaphoresis and ill-appearing and was admitted to the ICU.  The patient denies any on explained weight loss fevers chills dysphasia nausea or vomiting.  He also denies any black stools or bloody stools.  I am now being asked to see the patient for recurrent aspiration pneumonia.  Past Medical History:  Diagnosis Date  . Arrhythmia    tachycardia, A-Fib  . Arthritis   . Blind right eye   . BPH (benign prostatic hyperplasia)   .  Diabetes mellitus   . DVT (deep venous thrombosis) ([place]) 02/2010   leg thrombus ; dislodged into emboli and caused PE  . Dyspnea   . Dysrhythmia   . 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 GERD (gastroesophageal reflux disease)   . Hypercholesteremia   . Hypertension   . Kidney failure   . Neuromuscular disorder ([place])   . Neuropathy   . Pneumonia    time 9 ;last episode 12/2015  . Pulmonary embolus ([place]) 2011  . Seasonal allergies   . Seizures ([place])    as child   . Sleep apnea    BIPAP  . Stiff neck    limited turning s/p titanium plate placement  . Stroke ([place])   . TIA (transient ischemic attack)   . Wears dentures    full upper and lower    Past Surgical History:  Procedure Laterality Date  . APPENDECTOMY    . BACK SURGERY     x 8; upper x 3 & lower x 5  . CARDIOVERSION  03/14/13, 10/16   2014 - [place], 2016 - [place]  . CATARACT EXTRACTION W/PHACO Left 10/29/2015   Procedure: CATARACT EXTRACTION PHACO AND INTRAOCULAR LENS PLACEMENT (IOC);  Surgeon: Leandrew Koyanagi, MD;  Location: [place];  Service: Ophthalmology;  Laterality: Left;  DIABETIC - insulin and oral medsSleep apnea - no machine  . CHOLECYSTECTOMY    . COLONOSCOPY WITH PROPOFOL N/A 01/16/2018   Procedure: COLONOSCOPY WITH PROPOFOL;  Surgeon: Manya Silvas, MD;  Location[facility] ENDOSCOPY;  Service: Endoscopy;  Laterality: N/A;  . ESOPHAGOGASTRODUODENOSCOPY (EGD) WITH PROPOFOL N/A 01/16/2018   Procedure: ESOPHAGOGASTRODUODENOSCOPY (EGD) WITH PROPOFOL;  Surgeon: Manya Silvas, MD;  Location: [facility] ENDOSCOPY;  Service: Endoscopy;  Laterality: N/A;  . EYE SURGERY    . GALLBLADDER SURGERY  2002  . JOINT REPLACEMENT Right 2018  . KNEE ARTHROSCOPY     left   . ROTATOR CUFF REPAIR  2001   left   . TONSILLECTOMY    . TOTAL KNEE ARTHROPLASTY Right 06/20/2017   Procedure: RIGHT TOTAL KNEE ARTHROPLASTY;  Surgeon: Gaynelle Arabian, MD;  Location: [facility] ORS;  Service: Orthopedics;  Laterality:  Right;    Prior to Admission medications   Medication Sig Start Date End Date Taking? Authorizing Provider  amiodarone (PACERONE) 200 MG tablet TAKE 1 TABLET (200 MG TOTAL) BY MOUTH DAILY. Patient taking differently: Take 200 mg by mouth daily.  05/10/17  Yes Vaughan Basta, MD  aspirin 81 MG tablet Take 81 mg by mouth daily.   Yes [provider]  CINNAMON PO Take 1,000 mg by mouth 2 (two) times daily.    Yes [provider]  cyclobenzaprine (FLEXERIL) 10 MG tablet Take 10 mg by mouth 3 (three) times daily.    Yes [provider]  dutasteride (AVODART) 0.5 MG capsule Take 1 capsule (0.5 mg total) by mouth daily. 08/17/17  Yes Jerrol Banana., MD  esomeprazole (NEXIUM) 40 MG capsule TAKE 1 CAPSULE TWICE DAILY 03/07/18  Yes Jerrol Banana., MD  fexofenadine (ALLEGRA) 180 MG tablet Take 180 mg by mouth daily.     Yes [provider]  furosemide (LASIX) 40 MG tablet Take 1 tablet (40 mg total) by mouth daily. 12/30/17  Yes Jerrol Banana., MD  glucose blood (ONE TOUCH ULTRA TEST) test strip CHECK SUGAR TWICE DAILY. DX- E11.8 11/28/17  Yes Jerrol Banana., MD  hydrochlorothiazide (HYDRODIURIL) 12.5 MG tablet TAKE 2 TABLETS (25 MG TOTAL) BY MOUTH AT BEDTIME. 03/13/18  Yes Jerrol Banana., MD  insulin aspart (NOVOLOG FLEXPEN) 100 UNIT/ML FlexPen INJECT 14 UNITS 3 TIMES A   DAY (MORNING, NOON, & NIGHT) Patient taking differently: Inject 12 Units into the skin 3 (three) times daily with meals.  09/29/17  Yes Jerrol Banana., MD  Insulin Pen Needle (PEN NEEDLES) 31G X 5 MM MISC 1 each by Does not apply route daily. PATIENT NEEDS NOVA TWIST NEEDLES 5 MM  DX E11.9 10/18/16  Yes Jerrol Banana., MD  JANUVIA 100 MG tablet TAKE 1 TABLET DAILY 12/15/17  Yes Jerrol Banana., MD  LANTUS SOLOSTAR 100 UNIT/ML Solostar Pen Inject 30 Units into the skin 2 (two) times daily. 06/09/17  Yes Jerrol Banana., MD  liraglutide  (VICTOZA) 18 MG/3ML SOPN INJECT 0.3ML (=1.8MG ) INTO THE SKIN DAILY 11/02/17  Yes Jerrol Banana., MD  magnesium oxide (MAG-OX) 400 MG tablet TAKE 1 TABLET (400 MG TOTAL) BY MOUTH 2 (TWO) TIMES DAILY. 04/07/18  Yes Jerrol Banana., MD  metFORMIN (GLUCOPHAGE) 1000 MG tablet TAKE 1 TABLET (1,000 MG TOTAL) BY MOUTH 2 (TWO) TIMES DAILY. 07/13/17  Yes Jerrol Banana., MD  metoprolol tartrate (LOPRESSOR) 100 MG tablet TAKE 1 TABLET (  100 MG TOTAL) BY MOUTH 2 (TWO) TIMES DAILY. 02/19/18  Yes Jerrol Banana., MD  Multiple Vitamins-Minerals (CENTRUM SILVER PO) Take 1 tablet by mouth daily.    Yes [provider]  NOVOTWIST 32G X 5 MM MISC USE 6 TIMES A DAY DX E11.9 04/10/18  Yes Jerrol Banana., MD  Omega-3 Fatty Acids (FISH OIL) 1200 MG CAPS Take 1,200 mg by mouth 2 (two) times daily.    Yes [provider]  oxyCODONE (OXYCONTIN) 40 mg 12 hr tablet Take 1 tablet (40 mg total) by mouth every 8 (eight) hours as needed. Patient taking differently: Take 40 mg by mouth every 8 (eight) hours.  02/23/18  Yes Jerrol Banana., MD  pregabalin (LYRICA) 200 MG capsule Take 200 mg by mouth 2 (two) times daily with breakfast and lunch.   Yes [provider]  pregabalin (LYRICA) 300 MG capsule qhs 03/08/18  Yes Jerrol Banana., MD  ranitidine (ZANTAC) 300 MG tablet Take 1 tablet (300 mg total) by mouth daily. 01/18/18  Yes Jerrol Banana., MD  sucralfate (CARAFATE) 1 g tablet TAKE 1 TABLET (1 G TOTAL) BY MOUTH 4 (FOUR) TIMES DAILY - WITH MEALS AND AT BEDTIME. 02/13/18  Yes Jerrol Banana., MD  Testosterone Cypionate (DEPO-TESTOSTERONE IM) Inject into the muscle every 21 ( twenty-one) days.   Yes [provider]    Family History  Problem Relation Age of Onset  . Heart attack Brother      Social History   Tobacco Use  . Smoking status: Former Smoker    Packs/day: 2.00    Years: 20.00    Pack years: 40.00    Types: Cigarettes      Last attempt to quit: 12/26/1989    Years since quitting: 28.3  . Smokeless tobacco: Never Used  Substance Use Topics  . Alcohol use: No  . Drug use: No    Allergies as of 04/22/2018 - Review Complete 04/22/2018  Allergen Reaction Noted  . Carisoprodol Itching 07/11/2015    Review of Systems:    All systems reviewed and negative except where noted in HPI.   Physical Exam:  Vital signs in last 24 hours: Temp:  [97.2 F (36.2 C)-103.1 F (39.5 C)] 99.3 F (37.4 C) (04/27 0722) Pulse Rate:  [88-127] 88 (04/27 1000) Resp:  [13-26] 13 (04/27 1000) BP: (88-172)/(60-143) 124/64 (04/27 1000) SpO2:  [81 %-99 %] 93 % (04/27 1000) Weight:  [276 lb 7.3 oz (125.4 kg)] 276 lb 7.3 oz (125.4 kg) (04/27 0535) Last BM Date: 04/19/18 General:   Pleasant, cooperative in NAD Head:  Normocephalic and atraumatic. Eyes:   No icterus.   Conjunctiva pink. PERRLA. Ears:  Normal auditory acuity. Neck:  Supple; no masses or thyroidomegaly Lungs: Respirations even and unlabored. Lungs with rhonchi bilaterally.   No wheezes or crackles.  Heart:  Regular rate and rhythm;  Without murmur, clicks, rubs or gallops Abdomen:  Soft, nondistended, nontender. Normal bowel sounds. No appreciable masses or hepatomegaly.  No rebound or guarding.  Rectal:  Not performed. Msk:  Symmetrical without gross deformities.    Extremities:  Without edema, cyanosis or clubbing. Neurologic:  Alert and oriented x3;  grossly normal neurologically. Skin:  Intact without significant lesions or rashes. Cervical Nodes:  No significant cervical adenopathy. Psych:  Alert and cooperative. Normal affect.  LAB RESULTS: Recent Labs    04/22/18 0254  WBC 13.5*  HGB 12.9*  HCT 39.7*  PLT 133*  BMET Recent Labs    04/22/18 0254  NA 136  K 3.6  CL 98*  CO2 29  GLUCOSE 359*  BUN 26*  CREATININE 1.42*  CALCIUM 9.5   LFT Recent Labs    04/22/18 0254  PROT 7.3  ALBUMIN 3.9  AST 45*  ALT 35  ALKPHOS 110  BILITOT  0.6   PT/INR No results for input(s): LABPROT, INR in the last 72 hours.  STUDIES: Dg Chest Port 1 View  Result Date: 04/22/2018 CLINICAL DATA:  Fever and left-sided back and flank pain. History of pneumonia. Cough and shortness of breath. EXAM: PORTABLE CHEST 1 VIEW COMPARISON:  07/13/2017 FINDINGS: Shallow inspiration with atelectasis in the lung bases. Mild cardiac enlargement. No vascular congestion or edema. Slight infiltration in the right lung base may reflect pneumonia. No blunting of costophrenic angles. No pneumothorax. Postoperative changes in the cervical spine. IMPRESSION: Cardiac enlargement. Focal infiltration in the right lung base may indicate pneumonia. Electronically Signed   By: Lucienne Capers M.D.   On: 04/22/2018 03:32      Impression / Plan:   Jerry Parsons is a 67 y.o. y/o male with who has a report of 10 admissions for aspiration pneumonia and follows with Dr. Vira Agar as an outpatient with a recent EGD with a mild stricture dilated with a 17 mm dilator.  The patient does not have any symptoms of dysphasia.  The patient was evaluated by speech pathology who recommended a further evaluation on Monday.  I see no reason to repeat his upper endoscopy since he had the upper endoscopy on 21 January of this year.  This does not appear to be a tight stricture causing him to have dysphasia and aspiration of his food.  Further work-up should be done with speech pathology and if it is deemed that he cannot tolerate oral feedings then at that time he may need a PEG tube or alternate mode of feeding.  If that is the case then please re-consult me at that time.  Nothing further to do from a GI point of view. I will sign off.  Please call if any further GI concerns or questions.  We would like to thank you for the opportunity to participate in the care of Jerry Parsons.    Thank you for involving me in the care of this patient.      LOS: 0 days   Lucilla Lame, MD  04/22/2018, 1:25  PM   Note: This dictation was prepared with Dragon dictation along with smaller phrase technology. Any transcriptional errors that result from this process are unintentional.

## 2018-04-22 NOTE — ED Notes (Signed)
Critical lactic acid 2.9 called from lab. Dr. Beather Arbour notified.

## 2018-04-22 NOTE — ED Notes (Signed)
Waiting on rocephin from pharmacy, pharmacy previously notified by rebecca, rn.

## 2018-04-22 NOTE — Consult Note (Addendum)
PULMONARY / CRITICAL CARE MEDICINE   Name: Jerry Parsons MRN: [id number] DOB: December 01, 1951    ADMISSION DATE:  04/22/2018   CONSULTATION DATE:  04/22/2018  REFERRING MD: Arta Silence, MD  Reason: Acute encephalopathy and pneumonia  HISTORY OF PRESENT ILLNESS: This is a 67 year old Caucasian male with a history of recurrent pneumonia multiple comorbidities as listed below who presented to the ED with a fever, cough, shortness of breath and left-sided flank pain.  History is obtained from patient and from ED records.  Patient states that he started getting sick about 2 days ago.  Cough fever and shortness of breath progressively got worse.  Cough is productive of clear to yellow sputum.  No hemoptysis.  Last night he developed a fever and became more confused hands was brought to the ED for evaluation.  At the ED, patient was febrile, hypoxic, agitated and his labs showed WBC of 13 point 5K, creatinine of 1.42 up from his baseline of 1.39 and his chest x-ray showed a right lower lobe infiltrate consistent with pneumonia.  He is currently on a nonrebreather mask.  He denies chest pain, palpitations, headaches, nausea and vomiting.  He has a history of dysphagia.  Per patient's wife, his last swallow evaluation was normal.  He had an upper GI endoscopy with dilation of the esophagus in January of 02/15/2018.  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  PAST SURGICAL HISTORY: He  has  a past surgical history that includes Gallbladder surgery (2002); Rotator cuff repair (2001); Cardioversion (03/14/13, 10/16); Knee arthroscopy; Cataract extraction w/PHACO (Left, 10/29/2015); Cholecystectomy; Eye surgery; Tonsillectomy; Appendectomy; Back surgery; Total knee arthroplasty (Right, 06/20/2017); Joint replacement (Right, 2018); Colonoscopy with propofol (N/A, 01/16/2018); and Esophagogastroduodenoscopy (egd) with propofol (N/A, 01/16/2018).  Allergies  Allergen Reactions  . Carisoprodol Itching    No current facility-administered medications on file prior to encounter.    Current Outpatient Medications on File Prior to Encounter  Medication Sig  . amiodarone (PACERONE) 200 MG tablet TAKE 1 TABLET (200 MG TOTAL) BY MOUTH DAILY. (Patient taking differently: Take 200 mg by mouth daily. )  . aspirin 81 MG tablet Take 81 mg by mouth daily.  [name] CINNAMON PO Take 1,000 mg by mouth 2 (two) times daily.   . cyclobenzaprine (FLEXERIL) 10 MG tablet Take 10 mg by mouth 3 (three) times daily.   [name] dutasteride (AVODART) 0.5 MG capsule Take 1 capsule (0.5 mg total) by mouth daily.  [name] esomeprazole (NEXIUM) 40 MG capsule TAKE 1 CAPSULE TWICE DAILY  . fexofenadine (ALLEGRA) 180 MG tablet Take 180 mg by mouth daily.    . furosemide (LASIX) 40 MG tablet Take 1 tablet (40 mg total) by mouth daily.  [name] glucose blood (ONE TOUCH ULTRA TEST) test strip CHECK SUGAR TWICE DAILY. DX- E11.8  . hydrochlorothiazide (HYDRODIURIL) 12.5 MG tablet TAKE 2 TABLETS (25 MG TOTAL) BY MOUTH AT BEDTIME.  . insulin aspart (NOVOLOG FLEXPEN) 100 UNIT/ML FlexPen INJECT 14 UNITS 3 TIMES A   DAY (MORNING, NOON, & NIGHT) (Patient taking differently: Inject 12 Units into the skin 3 (three) times daily with meals. )  .  Insulin Pen Needle (PEN NEEDLES) 31G X 5 MM MISC 1 each by Does not apply route daily. PATIENT NEEDS NOVA TWIST NEEDLES 5 MM  DX E11.9  . JANUVIA 100 MG tablet TAKE 1 TABLET DAILY  . LANTUS SOLOSTAR 100 UNIT/ML Solostar  Pen Inject 30 Units into the skin 2 (two) times daily.  [name] liraglutide (VICTOZA) 18 MG/3ML SOPN INJECT 0.3ML (=1.8MG ) INTO THE SKIN DAILY  . magnesium oxide (MAG-OX) 400 MG tablet TAKE 1 TABLET (400 MG TOTAL) BY MOUTH 2 (TWO) TIMES DAILY.  . metFORMIN (GLUCOPHAGE) 1000 MG tablet TAKE 1 TABLET (1,000 MG TOTAL) BY MOUTH 2 (TWO) TIMES DAILY.  . metoprolol tartrate (LOPRESSOR) 100 MG tablet TAKE 1 TABLET (100 MG TOTAL) BY MOUTH 2 (TWO) TIMES DAILY.  . Multiple Vitamins-Minerals (CENTRUM SILVER PO) Take 1 tablet by mouth daily.   [name] NOVOTWIST 32G X 5 MM MISC USE 6 TIMES A DAY DX E11.9  . Omega-3 Fatty Acids (FISH OIL) 1200 MG CAPS Take 1,200 mg by mouth 2 (two) times daily.   [name] oxyCODONE (OXYCONTIN) 40 mg 12 hr tablet Take 1 tablet (40 mg total) by mouth every 8 (eight) hours as needed. (Patient taking differently: Take 40 mg by mouth every 8 (eight) hours. )  . pregabalin (LYRICA) 200 MG capsule Take 200 mg by mouth 2 (two) times daily with breakfast and lunch.  . pregabalin (LYRICA) 300 MG capsule qhs  . ranitidine (ZANTAC) 300 MG tablet Take 1 tablet (300 mg total) by mouth daily.  . sucralfate (CARAFATE) 1 g tablet TAKE 1 TABLET (1 G TOTAL) BY MOUTH 4 (FOUR) TIMES DAILY - WITH MEALS AND AT BEDTIME.  [name] Testosterone Cypionate (DEPO-TESTOSTERONE IM) Inject into the muscle every 21 ( twenty-one) days.    FAMILY HISTORY:  His indicated that his mother is deceased. He indicated that his father is deceased. He indicated that his sister is alive. He indicated that his brother is alive.   SOCIAL HISTORY: He  reports that he quit smoking about 28 years ago. His smoking use included cigarettes. He has a 40.00 pack-year smoking history. He has never used smokeless tobacco. He reports that he does not drink alcohol or use drugs.  REVIEW OF SYSTEMS:   Constitutional: Positive for fever and chills.  HENT: Negative for congestion and rhinorrhea.  Eyes: Negative for redness and visual disturbance.   Respiratory: Positive for shortness of breath, cough but negative for wheezing.  Cardiovascular: Negative for chest pain and palpitations.  Gastrointestinal: Negative  for nausea , vomiting and abdominal pain and loose stools Genitourinary: Negative for dysuria and urgency.  Endocrine: Denies polyuria, polyphagia and heat intolerance Musculoskeletal: Positive for back pain and left flank pain Skin: Negative for pallor and wound.  Neurological: Negative for dizziness and headaches but positive for bilateral lower extremity neuropathy  SUBJECTIVE:   VITAL SIGNS: BP 133/81   Pulse (!) 102   Temp (!) 97.2 F (36.2 C) (Axillary)   Resp 16   Ht 5\' 11"  (1.803 m)   Wt 276 lb 7.3 oz (125.4 kg)   SpO2 96%   BMI 38.56 kg/m   HEMODYNAMICS:    VENTILATOR SETTINGS:    INTAKE / OUTPUT: No intake/output data recorded.  PHYSICAL EXAMINATION: General: In moderate to severe distress Neuro: Alert and oriented x3, speech is normal, moves all extremities, follows commands HEENT: PERRLA, neck is supple with good range of motion, no JVD Cardiovascular: Pickup pulse regular, S1-S2, no murmurs regurg or gallop, +2 pulses bilaterally in upper  extremities, +1 in bilateral lower extremities, +2 nonpitting edema in bilateral lower extremities Lungs: Increased work of breathing, bilateral breath sounds with diffuse rhonchi greater in the right lung fields than left breath sounds diminished in the bases bilaterally Abdomen: Obese, normal bowel sounds in all 4 quadrants, palpation reveals no organomegaly Musculoskeletal: Positive range of motion in upper and lower extremities no joint swelling Skin: Warm and dry, mild venous stasis discoloration in bilateral lower extremities  LABS:  BMET Recent Labs  Lab 04/22/18 0254  NA 136  K 3.6  CL 98*  CO2 29  BUN 26*  CREATININE 1.42*  GLUCOSE 359*    Electrolytes Recent Labs  Lab 04/22/18 0254  CALCIUM 9.5    CBC Recent Labs  Lab  04/22/18 0254  WBC 13.5*  HGB 12.9*  HCT 39.7*  PLT 133*    Coag's No results for input(s): APTT, INR in the last 168 hours.  Sepsis Markers Recent Labs  Lab 04/22/18 0254 04/22/18 0447  LATICACIDVEN 2.9* 2.0*    ABG Recent Labs  Lab 04/22/18 0254  PHART 7.41  PCO2ART 45  PO2ART 83    Liver Enzymes Recent Labs  Lab 04/22/18 0254  AST 45*  ALT 35  ALKPHOS 110  BILITOT 0.6  ALBUMIN 3.9    Cardiac Enzymes Recent Labs  Lab 04/22/18 0254  TROPONINI <0.03    Glucose No results for input(s): GLUCAP in the last 168 hours.  Imaging Dg Chest Port 1 View  Result Date: 04/22/2018 CLINICAL DATA:  Fever and left-sided back and flank pain. History of pneumonia. Cough and shortness of breath. EXAM: PORTABLE CHEST 1 VIEW COMPARISON:  07/13/2017 FINDINGS: Shallow inspiration with atelectasis in the lung bases. Mild cardiac enlargement. No vascular congestion or edema. Slight infiltration in the right lung base may reflect pneumonia. No blunting of costophrenic angles. No pneumothorax. Postoperative changes in the cervical spine. IMPRESSION: Cardiac enlargement. Focal infiltration in the right lung base may indicate pneumonia. Electronically Signed   By: Lucienne Capers M.D.   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 Hypertension  Obstructive sleep apnea Dysphagia GERD  PLAN Titrated off nonrebreather mask; now doing well on nasal cannula Nebulized bronchodilators Antibiotics as above Follow-up cultures Gentle IV hydration with normal saline at 75 cc an hour Trend  creatinine Trend procalcitonin and adjust antibiotics GI consult for dysphagia and recurrent aspiration; patient will probably benefit from a modified barium swallow evaluation Nocturnal BiPAP Resume all home medications GI and DVT prophylaxis FAMILY  - Updates:   - Inter-disciplinary family meet or Palliative Care meeting due by:  day 7  Jahlisa Rossitto S. [facility] ANP-BC Pulmonary and Critical Care Medicine [facility] Pager [phone] or [phone]  NB: This document was prepared using Dragon voice recognition software and may include unintentional dictation errors.

## 2018-04-22 NOTE — ED Triage Notes (Signed)
Reports woke tonight with fever and left sided back/flank pain.  Patient with history of pneumonia.

## 2018-04-22 NOTE — Progress Notes (Signed)
Pt has remained alert and oriented with c/o chronic lower back pain-(c/w home oxycodone). Afebrile. Pt has remained on 5-6LNC, SpO2 btw 90-94%, lung sounds clear/diminished to auscultation, strong, non-productive cough -still in needs of sputum cx. Bipap at night-OSA.  Pt has remained in NSR on cardiac monitor, BP WNL. Lasix is curently HELD per Dr Azell Der 71 for NPO. GI has signed off until speech eval deems pt unable to tolerate PO->GI will recommend PEG placement from there.  Speech eval today cancelled (re ordered to re-eval oral intake d/t aspiration for possible diet over the weekend until barrium swallow on Monday).

## 2018-04-22 NOTE — Progress Notes (Signed)
CODE SEPSIS - PHARMACY COMMUNICATION  **Broad Spectrum Antibiotics should be administered within 1 hour of Sepsis diagnosis**  Time Code Sepsis Called/Page Received: n/a  Antibiotics Ordered: azithromycin/ceftriaxone  Time of 1st antibiotic administration: 0302  Additional action taken by pharmacy:   If necessary, Name of Provider/Nurse Contacted:     Tobie Lords ,PharmD Clinical Pharmacist  04/22/2018  3:08 AM

## 2018-04-22 NOTE — Progress Notes (Signed)
Patient ID: Jerry Parsons, male   DOB: 1951-10-28, 67 y.o.   MRN: [id number]  ACP note  Diagnosis: Acute hypoxic respiratory failure, sepsis and pneumonia, lactic acidosis, sleep apnea, esophageal stricture, diabetes, history of stroke, restless leg syndrome.  CODE STATUS discussed.  Patient is a full code.  Patient has had 9 pneumonias in the past.  Speech therapy to see the patient.  Patient had a recent endoscopy with esophageal stretching.  No acute indication for EGD today.  Depending on how the patient does with a swallow eval will depend on plan.  Time spent on ACP discussion 30 minutes  Dr. Marquis Lunch

## 2018-04-23 ENCOUNTER — Inpatient Hospital Stay: Admit: 2018-04-23 | Payer: Medicare Other

## 2018-04-23 DIAGNOSIS — G4733 Obstructive sleep apnea (adult) (pediatric): Secondary | ICD-10-CM

## 2018-04-23 DIAGNOSIS — E876 Hypokalemia: Secondary | ICD-10-CM

## 2018-04-23 DIAGNOSIS — R7881 Bacteremia: Secondary | ICD-10-CM

## 2018-04-23 LAB — BASIC METABOLIC PANEL
Anion gap: 4 — ABNORMAL LOW (ref 5–15)
BUN: 16 mg/dL (ref 6–20)
CO2: 29 mmol/L (ref 22–32)
Calcium: 8.5 mg/dL — ABNORMAL LOW (ref 8.9–10.3)
Chloride: 105 mmol/L (ref 101–111)
Creatinine, Ser: 0.99 mg/dL (ref 0.61–1.24)
GFR calc Af Amer: >60 mL/min (ref >60)
GFR calc non Af Amer: >60 mL/min (ref >60)
Glucose, Bld: 144 mg/dL — ABNORMAL HIGH (ref 65–99)
Potassium: 3.5 mmol/L (ref 3.5–5.1)
Sodium: 138 mmol/L (ref 135–145)

## 2018-04-23 LAB — CBC
HCT: 31.5 % — ABNORMAL LOW (ref 40.0–52.0)
Hemoglobin: 10.1 g/dL — ABNORMAL LOW (ref 13.0–18.0)
MCH: 24.7 pg — ABNORMAL LOW (ref 26.0–34.0)
MCHC: 31.9 g/dL — ABNORMAL LOW (ref 32.0–36.0)
MCV: 77.3 fL — ABNORMAL LOW (ref 80.0–100.0)
Platelets: 86 10*3/uL — ABNORMAL LOW (ref 150–440)
RBC: 4.08 MIL/uL — ABNORMAL LOW (ref 4.40–5.90)
RDW: 18.3 % — ABNORMAL HIGH (ref 11.5–14.5)
WBC: 8.1 10*3/uL (ref 3.8–10.6)

## 2018-04-23 LAB — URINE CULTURE: Culture: NO GROWTH

## 2018-04-23 LAB — SEDIMENTATION RATE: Sed Rate: 57 mm/hr — ABNORMAL HIGH (ref 0–20)

## 2018-04-23 LAB — GLUCOSE, CAPILLARY
Glucose-Capillary: 112 mg/dL — ABNORMAL HIGH (ref 65–99)
Glucose-Capillary: 139 mg/dL — ABNORMAL HIGH (ref 65–99)
Glucose-Capillary: 143 mg/dL — ABNORMAL HIGH (ref 65–99)
Glucose-Capillary: 145 mg/dL — ABNORMAL HIGH (ref 65–99)
Glucose-Capillary: 172 mg/dL — ABNORMAL HIGH (ref 65–99)
Glucose-Capillary: 204 mg/dL — ABNORMAL HIGH (ref 65–99)
Glucose-Capillary: 271 mg/dL — ABNORMAL HIGH (ref 65–99)

## 2018-04-23 LAB — PROCALCITONIN: Procalcitonin: 11.56 ng/mL

## 2018-04-23 LAB — C-REACTIVE PROTEIN: CRP: 16.2 mg/dL — ABNORMAL HIGH (ref <1.0)

## 2018-04-23 MED ORDER — POTASSIUM CHLORIDE CRYS ER 20 MEQ PO TBCR
20.0000 meq | EXTENDED_RELEASE_TABLET | Freq: Once | ORAL | Status: AC
Start: 2018-04-23 — End: 2018-04-23
  Administered 2018-04-23: 20 meq via ORAL
  Filled 2018-04-23: qty 1

## 2018-04-23 NOTE — Progress Notes (Signed)
Pt has remained alert and oriented x 4 w/ no c/o pain. Afebrile. Pt transitioned to venti mask 30%/4L from [facility] d/t mouth breathing. Lung sounds clear/diminished in lower bases, SpO2 90-93%. Strong, non-productive cough-sputum cx still needed. Bipap HS-OSA.  Pt has remained in NSR, BP WNL-lasix, metoprolol. Echo ordered-pending. Fluids d/cPt has been ordered a soft/carb diet-pt has been assessed closely for aspiration-SLP unavailable today-SpO2/RR unchanged, lung sounds remained clear, no cough to clear throat. MBSS sched for Monday. Pt has difficulty starting stream with urination in & out preformed x 1 HS for 658ml retention on BS. Pt has voided to urinal today appropriately. MRSA bacteriemia-contact precautions. Wife has been updated and remains at bedside.

## 2018-04-23 NOTE — Plan of Care (Signed)
VSS O/N, pt. A&O x 4, standby assist for ambulation BC positive, vancomycin started Pt. Tolerated Bipap O/N and appeared to sleep well- pain controlled with scheduled meds- no PRN's needed. Pt. Had some urinary retention- I&O cath performed, 550 mL drained. No care concerns at this time

## 2018-04-23 NOTE — Progress Notes (Signed)
      INFECTIOUS DISEASE ATTENDING:        [facility] Antimicrobial Management Team Staphylococcus aureus bacteremia   Staphylococcus aureus bacteremia (SAB) is associated with a high rate of complications and mortality.  Specific aspects of clinical management are critical to optimizing the outcome of patients with SAB.  Therefore, the [facility] Antimicrobial Management Team [facility]) has initiated an intervention aimed at improving the management of SAB at [facility].  To do so, Infectious Diseases physicians are providing an evidence-based consult for the management of all patients with SAB.     Yes No Comments  Perform follow-up blood cultures (even if the patient is afebrile) to ensure clearance of bacteremia [x]  []  Repeat cultures sent  Remove vascular catheter and obtain follow-up blood cultures after the removal of the catheter [x]  []  DO NOT PLACE PICC until we have sterilized blood  (he does NOT have central line at present)  Perform echocardiography to evaluate for endocarditis (transthoracic ECHO is 40-50% sensitive, TEE is > 90% sensitive) [x]  []  Please keep in mind, that neither test can definitively EXCLUDE endocarditis, and that should clinical suspicion remain high for endocarditis the patient should then still be treated with an "endocarditis" duration of therapy = 6 weeks TTE ordered, would  ask for TEE and Dr. Leslye Peer is correct that sensitivity is greater 5d into bacteremia to pick up on evolving vegetation  Consult electrophysiologist to evaluate implanted cardiac device (pacemaker, ICD) []  []    Ensure source control [x]  []  Have all abscesses been drained effectively? Have deep seeded infections (septic joints or osteomyelitis) had appropriate surgical debridement?  RLL is onlly obvious source at this point  Investigate for "metastatic" sites of infection [x]  []  Does the patient have ANY symptom or physical exam finding that would suggest a deeper infection (back or  neck pain that may be suggestive of vertebral osteomyelitis or epidural abscess, muscle pain that could be a symptom of pyomyositis)?  Keep in mind that for deep seeded infections MRI imaging with contrast is preferred rather than other often insensitive tests such as plain x-rays, especially early in a patient's presentation.  Change antibiotic therapy to vancomycin [x]  []  Beta-lactam antibiotics are preferred for MSSA due to higher cure rates.   If on Vancomycin, goal trough should be 15 - 20 mcg/mL  Estimated duration of IV antibiotic therapy:  4-6 weeks [x]  []  Consult case management for probably prolonged outpatient IV antibiotic therapy   Case Discussed with Dr. Leslye Peer.  (note pt thought to have aspiration pna but I think this is more likely MRSA pathology and would not have him on anything broader than vancomycin at present. At most could consider adding metronidazole but seems unnecessary)  I will forward pt information to Dr. Ola Spurr later tonight and he will see pt tomorrow.  Date: 04/23/2018  Patient name: Jerry Parsons  Medical record number: [id number]  Date of birth: [birth year]     Alcide Evener 04/23/2018, 8:54 AM

## 2018-04-23 NOTE — Progress Notes (Signed)
Patient ID: Jerry Parsons, male   DOB: 20-Apr-1951, 67 y.o.   MRN: [id number]  [employer] PROGRESS NOTE  Jerry Parsons DDU:[id number] DOB: [birth year] DOA: 04/22/2018 PCP: Jerry Parsons., MD  HPI/Subjective: Patient feels okay.  Offers no complaints.  On Ventimask.  Not much of a cough at all.  Objective: Vitals:   04/23/18 1000 04/23/18 1016  BP: (!) 128/59   Pulse: 84 77  Resp: 17 16  Temp:    SpO2: (!) 86% 93%    Filed Weights   04/22/18 0535  Weight: 125.4 kg (276 lb 7.3 oz)    ROS: Review of Systems  Constitutional: Negative for chills and fever.  Eyes: Negative for blurred vision.  Respiratory: Positive for shortness of breath. Negative for cough.   Cardiovascular: Negative for chest pain.  Gastrointestinal: Negative for abdominal pain, constipation, diarrhea, nausea and vomiting.  Genitourinary: Negative for dysuria.  Musculoskeletal: Negative for joint pain.  Neurological: Negative for dizziness and headaches.   Exam: Physical Exam  Constitutional: He is oriented to person, place, and time.  HENT:  Nose: No mucosal edema.  Mouth/Throat: No oropharyngeal exudate or posterior oropharyngeal edema.  Eyes: Pupils are equal, round, and reactive to light. Conjunctivae, EOM and lids are normal.  Neck: No JVD present. Carotid bruit is not present. No edema present. No thyroid mass and no thyromegaly present.  Cardiovascular: S1 normal and S2 normal. Exam reveals no gallop.  No murmur heard. Pulses:      Dorsalis pedis pulses are 2+ on the right side, and 2+ on the left side.  Respiratory: No respiratory distress. He has decreased breath sounds in the right lower field. He has no wheezes. He has rhonchi in the right lower field. He has no rales.  GI: Soft. Bowel sounds are normal. There is no tenderness.  Musculoskeletal:       Right ankle: He exhibits swelling.       Left ankle: He exhibits swelling.  Lymphadenopathy:    He has no cervical adenopathy.   Neurological: He is alert and oriented to person, place, and time. No cranial nerve deficit.  Skin: Skin is warm. No rash noted. Nails show no clubbing.  Psychiatric: He has a normal mood and affect.      Data Reviewed: Basic Metabolic Panel: Recent Labs  Lab 04/22/18 0254 04/22/18 0641 04/23/18 0721  NA 136  --  138  K 3.6  --  3.5  CL 98*  --  105  CO2 29  --  29  GLUCOSE 359*  --  144*  BUN 26*  --  16  CREATININE 1.42*  --  0.99  CALCIUM 9.5  --  8.5*  MG  --  1.8  --   PHOS  --  2.6  --    Liver Function Tests: Recent Labs  Lab 04/22/18 0254  AST 45*  ALT 35  ALKPHOS 110  BILITOT 0.6  PROT 7.3  ALBUMIN 3.9   CBC: Recent Labs  Lab 04/22/18 0254 04/23/18 0721  WBC 13.5* 8.1  NEUTROABS 11.8*  --   HGB 12.9* 10.1*  HCT 39.7* 31.5*  MCV 75.6* 77.3*  PLT 133* 86*   Cardiac Enzymes: Recent Labs  Lab 04/22/18 0254 04/22/18 0641  TROPONINI <0.03 0.04*   BNP (last 3 results) Recent Labs    04/22/18 0254  BNP 49.0     CBG: Recent Labs  Lab 04/22/18 2037 04/23/18 0023 04/23/18 0358 04/23/18 0724 04/23/18 1138  GLUCAP  98 112* 139* 143* 172*    Recent Results (from the past 240 hour(s))  Blood Culture (routine x 2)     Status: None (Preliminary result)   Collection Time: 04/22/18  2:54 AM  Result Value Ref Range Status   Specimen Description BLOOD BLOOD RIGHT HAND  Final   Special Requests   Final    BOTTLES DRAWN AEROBIC AND ANAEROBIC Blood Culture adequate volume   Culture   Final    NO GROWTH 1 DAY Performed at [facility], [address] [place], [place] [address]    Report Status PENDING  Incomplete  Blood Culture (routine x 2)     Status: None (Preliminary result)   Collection Time: 04/22/18  2:54 AM  Result Value Ref Range Status   Specimen Description   Final    BLOOD RIGHT ANTECUBITAL Performed at [facility], [address], [place], [place] [address]    Special Requests   Final    BOTTLES DRAWN  AEROBIC AND ANAEROBIC Blood Culture adequate volume Performed at [facility], [address] [place], [place] [address]    Culture  Setup Time   Final    ANAEROBIC BOTTLE ONLY GRAM POSITIVE COCCI IN CLUSTERS CRITICAL RESULT CALLED TO, READ BACK BY AND VERIFIED WITH: [place] 04/22/18.PMH Performed at [facility], [place] [address] [place], [place] [address]    Culture GRAM POSITIVE COCCI  Final   Report Status PENDING  Incomplete  Blood Culture ID Panel (Reflexed)     Status: Abnormal   Collection Time: 04/22/18  2:54 AM  Result Value Ref Range Status   Enterococcus species NOT DETECTED NOT DETECTED Final   Listeria monocytogenes NOT DETECTED NOT DETECTED Final   Staphylococcus species DETECTED (A) NOT DETECTED Final    Comment: CRITICAL RESULT CALLED TO, READ BACK BY AND VERIFIED WITH: GARRETT COFFEE AT 1956 04/22/18.PMH    Staphylococcus aureus DETECTED (A) NOT DETECTED Final    Comment: Methicillin (oxacillin)-resistant Staphylococcus aureus (MRSA). MRSA is predictably resistant to beta-lactam antibiotics (except ceftaroline). Preferred therapy is vancomycin unless clinically contraindicated. Patient requires contact precautions if  hospitalized. CRITICAL RESULT CALLED TO, READ BACK BY AND VERIFIED WITH: GARRETT COFFEE AT 1956 04/22/18.PMH    Methicillin resistance DETECTED (A) NOT DETECTED Final    Comment: CRITICAL RESULT CALLED TO, READ BACK BY AND VERIFIED WITH: GARRETT COFFEE AT 1956 04/22/18.PMH    Streptococcus species NOT DETECTED NOT DETECTED Final   Streptococcus agalactiae NOT DETECTED NOT DETECTED Final   Streptococcus pneumoniae NOT DETECTED NOT DETECTED Final   Streptococcus pyogenes NOT DETECTED NOT DETECTED Final   Acinetobacter baumannii NOT DETECTED NOT DETECTED Final   Enterobacteriaceae species NOT DETECTED NOT DETECTED Final   Enterobacter cloacae complex NOT DETECTED NOT DETECTED Final   Escherichia coli NOT DETECTED NOT  DETECTED Final   Klebsiella oxytoca NOT DETECTED NOT DETECTED Final   Klebsiella pneumoniae NOT DETECTED NOT DETECTED Final   Proteus species NOT DETECTED NOT DETECTED Final   Serratia marcescens NOT DETECTED NOT DETECTED Final   Haemophilus influenzae NOT DETECTED NOT DETECTED Final   Neisseria meningitidis NOT DETECTED NOT DETECTED Final   Pseudomonas aeruginosa NOT DETECTED NOT DETECTED Final   Candida albicans NOT DETECTED NOT DETECTED Final   Candida glabrata NOT DETECTED NOT DETECTED Final   Candida krusei NOT DETECTED NOT DETECTED Final   Candida parapsilosis NOT DETECTED NOT DETECTED Final   Candida tropicalis NOT DETECTED NOT DETECTED Final  Comment: Performed at [facility], [address], [place], [place] [address]  Urine culture     Status: None   Collection Time: 04/22/18  2:55 AM  Result Value Ref Range Status   Specimen Description   Final    URINE, RANDOM Performed at [facility], [address], [place], [place] [address]    Special Requests   Final    NONE Performed at [facility], [address] [place], [place] [address]    Culture   Final    NO GROWTH Performed at [facility], [place] [address] [place], [place] [address]    Report Status 04/23/2018 FINAL  Final  MRSA PCR Screening     Status: None   Collection Time: 04/22/18  5:53 AM  Result Value Ref Range Status   MRSA by PCR NEGATIVE NEGATIVE Final    Comment:        The GeneXpert MRSA Assay (FDA approved for NASAL specimens only), is one component of a comprehensive MRSA colonization surveillance program. It is not intended to diagnose MRSA infection nor to guide or monitor treatment for MRSA infections. Performed at [facility], [address] [place], [place] [address]   Culture, blood (Routine X 2) w Reflex to ID Panel     Status: None (Preliminary result)   Collection Time: 04/22/18 11:33 PM  Result Value Ref Range Status   Specimen  Description BLOOD LEFT ANTECUBITAL  Final   Special Requests   Final    BOTTLES DRAWN AEROBIC AND ANAEROBIC Blood Culture adequate volume   Culture   Final    NO GROWTH < 12 HOURS Performed at [facility], [address], [place],  [address]    Report Status PENDING  Incomplete  Culture, blood (Routine X 2) w Reflex to ID Panel     Status: None (Preliminary result)   Collection Time: 04/22/18 11:43 PM  Result Value Ref Range Status   Specimen Description BLOOD LEFT HAND  Final   Special Requests   Final    BOTTLES DRAWN AEROBIC AND ANAEROBIC Blood Culture results may not be optimal due to an excessive volume of blood received in culture bottles   Culture   Final    NO GROWTH < 12 HOURS Performed at [facility], [address] [place],  [address]    Report Status PENDING  Incomplete     Studies: Dg Chest Port 1 View  Result Date: 04/22/2018 CLINICAL DATA:  Fever and left-sided back and flank pain. History of pneumonia. Cough and shortness of breath. EXAM: PORTABLE CHEST 1 VIEW COMPARISON:  07/13/2017 FINDINGS: Shallow inspiration with atelectasis in the lung bases. Mild cardiac enlargement. No vascular congestion or edema. Slight infiltration in the right lung base may reflect pneumonia. No blunting of costophrenic angles. No pneumothorax. Postoperative changes in the cervical spine. IMPRESSION: Cardiac enlargement. Focal infiltration in the right lung base may indicate pneumonia. Electronically Signed   By: Lucienne Capers M.D.   On: 04/22/2018 03:32    Scheduled Meds: . amiodarone  200 mg Oral Daily  . aspirin EC  81 mg Oral Daily  . atorvastatin  40 mg Oral q1800  . chlorhexidine  15 mL Mouth Rinse BID  . cyclobenzaprine  10 mg Oral BID  . dutasteride  0.5 mg Oral Daily  . enoxaparin (LOVENOX) injection  40 mg Subcutaneous Q24H  . famotidine  20 mg Oral Daily  . furosemide  40 mg Oral  Daily  . hydrochlorothiazide  25 mg Oral QHS  . insulin  aspart  0-20 Units Subcutaneous Q4H  . insulin aspart  14 Units Subcutaneous TID WC  . insulin glargine  30 Units Subcutaneous BID  . ipratropium-albuterol  3 mL Nebulization Q6H  . mouth rinse  15 mL Mouth Rinse BID  . metoprolol tartrate  100 mg Oral BID  . multivitamin with minerals  1 tablet Oral Daily  . oxyCODONE  40 mg Oral Q8H  . pantoprazole  40 mg Oral Daily  . potassium chloride  20 mEq Oral Once  . pregabalin  200 mg Oral BID  . pregabalin  300 mg Oral QHS  . sucralfate  1 g Oral TID WC & HS   Continuous Infusions: . vancomycin Stopped (04/23/18 0336)    Assessment/Plan:  1. Clinical sepsis present on admission secondary to pneumonia.  Lactic acidosis.  One blood  culture growing MRSA.  Antibiotics switched to vancomycin.  Will need infectious disease consultation on Monday.  Echocardiogram ordered.  Repeat blood cultures sent off.  Likely will end up needing PICC line and antibiotics longer-term.  Procalcitonin elevated. 2. Acute hypoxic respiratory failure.  Patient currently on Ventimask.  Initially admitted on BiPAP. 3. History of sleep apnea on CPAP at night 4. History of esophageal stricture status post dilation last endoscopy 5. Type 2 diabetes mellitus on glargine insulin and sliding scale 6. Obesity with a BMI of 38.56 7. Essential hypertension on hydrochlorothiazide, metoprolol 8. Restless leg syndrome on Lyrica 9. Hyperlipidemia unspecified on atorvastatin 10. History of arrhythmia on amiodarone and metoprolol 11. Chronic pain on high-dose pain medications.  Code Status:     Code Status Orders  (From admission, onward)        Start     Ordered   04/22/18 0536  Full code  Continuous     04/22/18 0535    Code Status History    Date Active Date Inactive Code Status Order ID Comments User Context   06/20/2017 1035 06/22/2017 1801 Full Code [id number]  Gaynelle Arabian, MD Inpatient   05/08/2017 0002 05/10/2017 1512 Full Code [id number]  Harvie Bridge, DO  Inpatient   09/08/2015 2107 09/09/2015 1804 Full Code [id number]  Demetrios Loll, MD Inpatient    Advance Directive Documentation     Most Recent Value  Type of Advance Directive  Healthcare Power of Attorney  Pre-existing out of facility DNR order (yellow form or pink MOST form)  -  "MOST" Form in Place?  -     Family Communication: Wife at the bedside Disposition Plan: To be determined  Consultants:  Critical care specialist  Antibiotics:  IV vancomycin  Time spent: 28 minutes  [place]

## 2018-04-23 NOTE — Progress Notes (Signed)
PULMONARY / CRITICAL CARE MEDICINE   Name: Jerry Parsons MRN: [id number] DOB: [birth year]    ADMISSION DATE:  04/22/2018   CONSULTATION DATE:  04/22/2018  REFERRING MD: Arta Silence, MD  Reason: Acute encephalopathy and pneumonia  HISTORY OF PRESENT ILLNESS: This is a 67 year old Caucasian male with a history of recurrent pneumonia multiple comorbidities as listed below who presented to the ED with a fever, cough, shortness of breath and left-sided flank pain.  History is obtained from patient and from ED records.  Patient states that he started getting sick about 2 days ago.  Cough fever and shortness of breath progressively got worse.  Cough is productive of clear to yellow sputum.  No hemoptysis.  Last night he developed a fever and became more confused hands was brought to the ED for evaluation.  At the ED, patient was febrile, hypoxic, agitated and his labs showed WBC of 13 point 5K, creatinine of 1.42 up from his baseline of 1.39 and his chest x-ray showed a right lower lobe infiltrate consistent with pneumonia.  He is currently on a nonrebreather mask.  He denies chest pain, palpitations, headaches, nausea and vomiting.  He has a history of dysphagia.  Per patient's wife, his last swallow evaluation was normal.  He had an upper GI endoscopy with dilation of the esophagus in January of 02/15/2018.    REVIEW OF SYSTEMS:   Constitutional:Apyrexial HENT: Negative for congestion and rhinorrhea.  Eyes: Negative for redness and visual disturbance.  Respiratory: No Shortness of breath today Cardiovascular: Negative for chest pain and palpitations.  Gastrointestinal: Negative  for nausea , vomiting and abdominal pain and loose stools Genitourinary: Negative for dysuria and urgency.  Endocrine: Denies polyuria, polyphagia and heat intolerance Musculoskeletal: Positive for chronic back pain and left flank pain Skin: Negative for pallor and wound.  Neurological: Negative for dizziness and  headaches but positive for bilateral lower extremity neuropathy  SUBJECTIVE:  Patient reports that he feels much better today.  VITAL SIGNS: BP (!) 128/59   Pulse 77   Temp 98.8 F (37.1 C) (Axillary)   Resp 16   Ht 5\' 11"  (1.803 m)   Wt 276 lb 7.3 oz (125.4 kg)   SpO2 93%   BMI 38.56 kg/m   HEMODYNAMICS:   No compromise  VENTILATOR SETTINGS: FiO2 (%):  [40 %] 40 % Nocturnal CPAP 5 liters VM oxygen as patient is mouth breather  INTAKE / OUTPUT: I/O last 3 completed shifts: In: 6251.5 [P.O.:480; I.V.:2321.5; IV Piggyback:3450] Out: 1062 [Urine:1175]  PHYSICAL EXAMINATION: General: comfortable without distress Neuro: Alert and oriented x3, speech is normal, moves all extremities, follows commands HEENT: PERRLA, neck is supple with good range of motion, no JVD Cardiovascular: pulse regular, S1-S2, no murmurs regurg or gallop, +2 pulses bilaterally in upper extremities, +1 in bilateral lower extremities, +2 nonpitting edema in bilateral lower extremities Lungs: Good air entry bilaterally, occasional rhonci Abdomen: Obese, normal bowel sounds in all 4 quadrants, palpation reveals no organomegaly Musculoskeletal: Positive range of motion in upper and lower extremities no joint swelling Skin: Warm and dry, mild venous stasis discoloration in bilateral lower extremities  LABS:  BMET Recent Labs  Lab 04/22/18 0254 04/23/18 0721  NA 136 138  K 3.6 3.5  CL 98* 105  CO2 29 29  BUN 26* 16  CREATININE 1.42* 0.99  GLUCOSE 359* 144*    Electrolytes Recent Labs  Lab 04/22/18 0254 04/22/18 0641 04/23/18 0721  CALCIUM 9.5  --  8.5*  MG  --  1.8  --   PHOS  --  2.6  --     CBC Recent Labs  Lab 04/22/18 0254 04/23/18 0721  WBC 13.5* 8.1  HGB 12.9* 10.1*  HCT 39.7* 31.5*  PLT 133* 86*    Coag's No results for input(s): APTT, INR in the last 168 hours.  Sepsis Markers Recent Labs  Lab 04/22/18 0254 04/22/18 0447 04/22/18 0641 04/23/18 0721   LATICACIDVEN 2.9* 2.0*  --   --   PROCALCITON  --   --  8.18 11.56    ABG Recent Labs  Lab 04/22/18 0254  PHART 7.41  PCO2ART 45  PO2ART 83    Liver Enzymes Recent Labs  Lab 04/22/18 0254  AST 45*  ALT 35  ALKPHOS 110  BILITOT 0.6  ALBUMIN 3.9    Cardiac Enzymes Recent Labs  Lab 04/22/18 0254 04/22/18 0641  TROPONINI <0.03 0.04*    Glucose Recent Labs  Lab 04/22/18 1610 04/22/18 2037 04/23/18 0023 04/23/18 0358 04/23/18 0724 04/23/18 1138  GLUCAP 161* 98 112* 139* 143* 172*    Imaging No results found. 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  Rocephin and Doxycyline D?C by tele ID ID consult in AM Repeat blood cultures 2D transthoracic ECHO by day 5 of admission Nebulized bronchodilators Antibiotics as above Electrolytes replacement Follow-up cultures D/C IVF as patient fully hydrated now Trend procalcitonin and lactic acid GI consult for dysphagia and recurrent aspiration; patient will probably benefit from a modified barium swallow evaluation Nocturnal BiPAP/CPAP Resume all home medications GI and DVT prophylaxis Add flutter valve therapy FAMILY  - Updates: Wife and patient updated  Disp: patient can be transferred out of ICU today.    Cammie Sickle, MD Pulmonary and [place] Pager [phone] or  [phone]

## 2018-04-24 ENCOUNTER — Inpatient Hospital Stay: Payer: Medicare Other

## 2018-04-24 ENCOUNTER — Inpatient Hospital Stay (HOSPITAL_COMMUNITY)
Admit: 2018-04-24 | Discharge: 2018-04-24 | Disposition: A | Discharge status: Home / Self Care | Payer: Medicare Other | Attending: Internal Medicine | Admitting: Internal Medicine

## 2018-04-24 DIAGNOSIS — R7881 Bacteremia: Secondary | ICD-10-CM

## 2018-04-24 DIAGNOSIS — A419 Sepsis, unspecified organism: Secondary | ICD-10-CM

## 2018-04-24 DIAGNOSIS — J189 Pneumonia, unspecified organism: Secondary | ICD-10-CM

## 2018-04-24 LAB — LEGIONELLA PNEUMOPHILA SEROGP 1 UR AG: L. pneumophila Serogp 1 Ur Ag: NEGATIVE

## 2018-04-24 LAB — BASIC METABOLIC PANEL
Anion gap: 6 (ref 5–15)
BUN: 15 mg/dL (ref 6–20)
CO2: 30 mmol/L (ref 22–32)
Calcium: 9.1 mg/dL (ref 8.9–10.3)
Chloride: 100 mmol/L — ABNORMAL LOW (ref 101–111)
Creatinine, Ser: 1.12 mg/dL (ref 0.61–1.24)
GFR calc Af Amer: >60 mL/min (ref >60)
GFR calc non Af Amer: >60 mL/min (ref >60)
Glucose, Bld: 191 mg/dL — ABNORMAL HIGH (ref 65–99)
Potassium: 3.8 mmol/L (ref 3.5–5.1)
Sodium: 136 mmol/L (ref 135–145)

## 2018-04-24 LAB — ECHOCARDIOGRAM COMPLETE
Height: 71 in
Weight: 4423.31 oz

## 2018-04-24 LAB — GLUCOSE, CAPILLARY
Glucose-Capillary: 124 mg/dL — ABNORMAL HIGH (ref 65–99)
Glucose-Capillary: 172 mg/dL — ABNORMAL HIGH (ref 65–99)
Glucose-Capillary: 185 mg/dL — ABNORMAL HIGH (ref 65–99)
Glucose-Capillary: 190 mg/dL — ABNORMAL HIGH (ref 65–99)
Glucose-Capillary: 200 mg/dL — ABNORMAL HIGH (ref 65–99)
Glucose-Capillary: 222 mg/dL — ABNORMAL HIGH (ref 65–99)
Glucose-Capillary: 297 mg/dL — ABNORMAL HIGH (ref 65–99)

## 2018-04-24 LAB — PROCALCITONIN: Procalcitonin: 6.79 ng/mL

## 2018-04-24 LAB — HIV ANTIBODY (ROUTINE TESTING W REFLEX): HIV Screen 4th Generation wRfx: NONREACTIVE

## 2018-04-24 LAB — MAGNESIUM: Magnesium: 1.8 mg/dL (ref 1.7–2.4)

## 2018-04-24 LAB — LACTIC ACID, PLASMA: Lactic Acid, Venous: 1.3 mmol/L (ref 0.5–1.9)

## 2018-04-24 LAB — VANCOMYCIN, TROUGH: Vancomycin Tr: 12 ug/mL — ABNORMAL LOW (ref 15–20)

## 2018-04-24 LAB — PLATELET COUNT: Platelets: 110 10*3/uL — ABNORMAL LOW (ref 150–440)

## 2018-04-24 MED ORDER — INSULIN ASPART 100 UNIT/ML ~~LOC~~ SOLN
0.0000 [IU] | Freq: Every day | SUBCUTANEOUS | Status: DC
  Administered 2018-04-25: 2 [IU] via SUBCUTANEOUS
  Filled 2018-04-24: qty 1

## 2018-04-24 MED ORDER — VANCOMYCIN HCL 10 G IV SOLR
1500.0000 mg | Freq: Two times a day (BID) | INTRAVENOUS | Status: DC
  Administered 2018-04-25 – 2018-04-28 (×7): 1500 mg via INTRAVENOUS
  Filled 2018-04-24 (×9): qty 1500

## 2018-04-24 MED ORDER — INSULIN ASPART 100 UNIT/ML ~~LOC~~ SOLN
0.0000 [IU] | Freq: Three times a day (TID) | SUBCUTANEOUS | Status: DC
  Administered 2018-04-24: 3 [IU] via SUBCUTANEOUS
  Administered 2018-04-25: 7 [IU] via SUBCUTANEOUS
  Administered 2018-04-25: 11 [IU] via SUBCUTANEOUS
  Administered 2018-04-25: 15 [IU] via SUBCUTANEOUS
  Administered 2018-04-26: 4 [IU] via SUBCUTANEOUS
  Administered 2018-04-26 (×2): 7 [IU] via SUBCUTANEOUS
  Administered 2018-04-27: 4 [IU] via SUBCUTANEOUS
  Administered 2018-04-27: 7 [IU] via SUBCUTANEOUS
  Administered 2018-04-27 – 2018-04-28 (×2): 20 [IU] via SUBCUTANEOUS
  Administered 2018-04-28 (×2): 7 [IU] via SUBCUTANEOUS
  Filled 2018-04-24 (×13): qty 1

## 2018-04-24 MED ORDER — SODIUM CHLORIDE 0.9 % IV SOLN
3.0000 g | Freq: Four times a day (QID) | INTRAVENOUS | Status: DC
  Administered 2018-04-24 – 2018-04-26 (×7): 3 g via INTRAVENOUS
  Filled 2018-04-24 (×13): qty 3

## 2018-04-24 NOTE — Evaluation (Addendum)
Objective Swallowing Evaluation: Type of Study: MBS-Modified Barium Swallow Study   Patient Details  Name: Jerry Parsons MRN: [id number] Date of Birth: 1951-10-05  Today's Date: 04/24/2018 Time: SLP Start Time (ACUTE ONLY): 1100 -SLP Stop Time (ACUTE ONLY): 1200  SLP Time Calculation (min) (ACUTE ONLY): 60 min   Past Medical History:  Past Medical History:  Diagnosis Date  . Arrhythmia    tachycardia, A-Fib  . Arthritis   . Blind right eye   . BPH (benign prostatic hyperplasia)   . Diabetes mellitus   . DVT (deep venous thrombosis) ([place]) 02/2010   leg thrombus ; dislodged into emboli and caused PE  . Dyspnea   . Dysrhythmia   . 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 GERD (gastroesophageal reflux disease)   . Hypercholesteremia   . Hypertension   . Kidney failure   . Neuromuscular disorder ([place])   . Neuropathy   . Pneumonia    time 9 ;last episode 12/2015  . Pulmonary embolus ([place]) 2011  . Seasonal allergies   . Seizures ([place])    as child   . Sleep apnea    BIPAP  . Stiff neck    limited turning s/p titanium plate placement  . Stroke ([place])   . TIA (transient ischemic attack)   . Wears dentures    full upper and lower   Past Surgical History:  Past Surgical History:  Procedure Laterality Date  . APPENDECTOMY    . BACK SURGERY     x 8; upper x 3 & lower x 5  . CARDIOVERSION  03/14/13, 10/16   2014 - [place], 2016 - [place]  . CATARACT EXTRACTION W/PHACO Left 10/29/2015   Procedure: CATARACT EXTRACTION PHACO AND INTRAOCULAR LENS PLACEMENT (IOC);  Surgeon: Leandrew Koyanagi, MD;  Location: [facility];  Service: Ophthalmology;  Laterality: Left;  DIABETIC - insulin and oral medsSleep apnea - no machine  . CHOLECYSTECTOMY    . COLONOSCOPY WITH PROPOFOL N/A 01/16/2018   Procedure: COLONOSCOPY WITH PROPOFOL;  Surgeon: Manya Silvas, MD;  Location[facility];  Service: Endoscopy;  Laterality: N/A;  . ESOPHAGOGASTRODUODENOSCOPY (EGD) WITH  PROPOFOL N/A 01/16/2018   Procedure: ESOPHAGOGASTRODUODENOSCOPY (EGD) WITH PROPOFOL;  Surgeon: Manya Silvas, MD;  Location: [facility] ENDOSCOPY;  Service: Endoscopy;  Laterality: N/A;  . EYE SURGERY    . GALLBLADDER SURGERY  2002  . JOINT REPLACEMENT Right 2018  . KNEE ARTHROSCOPY     left   . ROTATOR CUFF REPAIR  2001   left   . TONSILLECTOMY    . TOTAL KNEE ARTHROPLASTY Right 06/20/2017   Procedure: RIGHT TOTAL KNEE ARTHROPLASTY;  Surgeon: Gaynelle Arabian, MD;  Location: [facility];  Service: Orthopedics;  Laterality: Right;   HPI: Pt is a 67 y.o. 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 Pt is accompanied by his wife at bedside. Pt has a known Hx of aspiration, for which he is followed by his PCP (Dr. [name]). He has been hospitalized with pneumonia ~9x in the past ~5 years, and his wife tells me the present admission is his tenth such admission. On his last admission, he was evaluated and managed by SLP, but the pt's wife tells me he did not receive any  outpt SLP services after discharge. Pt endorses 2d Hx of cough, intermittently productive of clear/white sputum, as well as SOB, both progressively worsening. He endorses a 3-4d Hx of progressively worsening back pain, as well as mild R-sided chest discomfort. Overnight (Friday 04/21/2018), pt's wife states she woke up at ~0200AM to find that the thermostat was turned to 29F, and the pt was wearing jeans and shoes under his pajamas. He endorsed chills, but denied fever. He appears diaphoretic, ill-appearing and restless at the time of my Hx/examination, but is in no acute respiratory distress on 100% NRB mask. He is alert, responds appropriately to questions and commands. Wife and pt unsure of the dates  they were seen by ST services. Wife endorses significant period/years of GERD; on Nexium 2x daily for a PPI.    Subjective: pt awake, sitting in chair. Verbally responsive. Few cues needed.     Assessment / Plan / Recommendation  CHL IP CLINICAL IMPRESSIONS 04/24/2018  Clinical Impression Pt appears to present w/ mild-moderate oropharyngela phase dysphagia w/ laryngeal Penetration and slight-min Aspiration noted of thiln iquids during this study(when not following the strategies). Dueing the pharyngeal phase, pt exhibited delayed pharyngeal swallow initiation w/ the trials of liquids - this delay in swallow initiation resulted in liquids spilling to, filling, the pyriform sinuses as the swallow initiated. Due to the decreased airway closure during the swallow, liquids penetrated into the laryngeal vestibule during the swallow. No immediate aspiration was noted during the swallow, however, a small amount was noted b/t trials that had been penetrated material prior. This aspiration was SILENT. Pt was cued to use strategies including Chin Tuck, f/u dry swallow, and Throat Clear/Cough post trials of liquids moreso. These strategies appeared to aid airway protection and reduction of laryngeal penetration during the swallow w/ trials of thin liquids. All liquids were given VIA CUP - NO STRAWS. A more timely pharyngeal swallow was noted at the level of valleculae w/ trials of puree and soft solids moistened well. Of note, pt was able to follow through w/ the strategies but required verbal cues. During the Oral phase, pt exhibited min slower; less organized bolus management and mastication w/ the increased textured trials. Decreased bolus control during A-P transfer intermittently. Premature spillage noted w/ the liquid trials. Pt endorses Reflux; he manages his Reflux w/ Nexium 2x daily; no noted dysmotility noted in the cervical Esophagus. Any consistent impact from Esophageal reflux and regurgitation over the  years could have impacted the pharyngeal phase and the sensation for initiation of the pharyngeal swallow. Pt also takes increased pain medications per MD/NSG which could also increase drowsiness and follow through w/ strategies. Recommend a modified diet w/ thin liquids VIA CUP - NO STRAWS; aspiration precautions; swallowing strategies; f/u w/ ST services at discharge. NSG updated. Precautions posted in room.  Recommend f/u post discharge and possible skilled ST services again d/t pt's increased risk for aspiration and Pulmonary impact from such.   SLP Visit Diagnosis Dysphagia, oropharyngeal phase (R13.12)  Attention and concentration deficit following --  Frontal lobe and executive function deficit following --  Impact on safety and function Mild aspiration risk;Moderate aspiration risk      CHL IP TREATMENT RECOMMENDATION 04/24/2018  Treatment Recommendations Therapy as outlined in treatment plan below     Prognosis 04/24/2018  Prognosis for Safe Diet Advancement Fair  Barriers to Reach Goals Time post onset;Severity of deficits  Barriers/Prognosis Comment --    CHL IP DIET RECOMMENDATION 04/24/2018  SLP Diet Recommendations Thin  liquid;Dysphagia 3 (Mech soft) solids  Liquid Administration via Cup;No straw  Medication Administration Whole meds with puree  Compensations Minimize environmental distractions;Slow rate;Small sips/bites;Lingual sweep for clearance of pocketing;Multiple dry swallows after each bite/sip;Follow solids with liquid  Postural Changes Remain semi-upright after after feeds/meals (Comment);Seated upright at 90 degrees      CHL IP OTHER RECOMMENDATIONS 04/24/2018  Recommended Consults (No Data)  Oral Care Recommendations Oral care BID;Staff/trained caregiver to provide oral care;Patient independent with oral care  Other Recommendations (No Data)      CHL IP FOLLOW UP RECOMMENDATIONS 04/24/2018  Follow up Recommendations Home health SLP      CHL IP FREQUENCY AND  DURATION 04/24/2018  Speech Therapy Frequency (ACUTE ONLY) min 2x/week  Treatment Duration 1 week           CHL IP ORAL PHASE 04/24/2018  Oral Phase Impaired  Oral - Pudding Teaspoon --  Oral - Pudding Cup --  Oral - Honey Teaspoon --  Oral - Honey Cup --  Oral - Nectar Teaspoon --  Oral - Nectar Cup --  Oral - Nectar Straw --  Oral - Thin Teaspoon --  Oral - Thin Cup --  Oral - Thin Straw --  Oral - Puree --  Oral - Mech Soft --  Oral - Regular --  Oral - Multi-Consistency --  Oral - Pill --  Oral Phase - Comment pt exhibited min slower; less organized bolus management and mastication w/ the increased textured trials. Decreased bolus control during A-P transfer intermittently. Premature spillage noted w/ the liquid trials.     CHL IP PHARYNGEAL PHASE 04/24/2018  Pharyngeal Phase Impaired  Pharyngeal- Pudding Teaspoon --  Pharyngeal --  Pharyngeal- Pudding Cup --  Pharyngeal --  Pharyngeal- Honey Teaspoon --  Pharyngeal --  Pharyngeal- Honey Cup --  Pharyngeal --  Pharyngeal- Nectar Teaspoon --  Pharyngeal --  Pharyngeal- Nectar Cup --  Pharyngeal --  Pharyngeal- Nectar Straw --  Pharyngeal --  Pharyngeal- Thin Teaspoon --  Pharyngeal --  Pharyngeal- Thin Cup --  Pharyngeal --  Pharyngeal- Thin Straw --  Pharyngeal --  Pharyngeal- Puree --  Pharyngeal --  Pharyngeal- Mechanical Soft --  Pharyngeal --  Pharyngeal- Regular --  Pharyngeal --  Pharyngeal- Multi-consistency --  Pharyngeal --  Pharyngeal- Pill --  Pharyngeal --  Pharyngeal Comment pt exhibited delayed pharyngeal swallow initiation w/ the trials of liquids - this delay in swallow initiation resulted in liquids spilling to, filling, the pyriform sinuses as the swallow initiated. Due to the decreased airway closure during the swallow, liquids penetrated into the laryngeal vestibule during the swallow. No immediate aspiration was noted during the swallow, however, a small amount was noted b/t trials that  had been penetrated material prior. This aspiration was SILENT. Pt was cued to use strategies including Chin Tuck, f/u dry swallow, and Throat Clear/Cough post trials of liquids moreso. These strategies appeared to aid airway protection and reduction of laryngeal penetration during the swallow w/ trials of thin liquids. All liquids were given VIA CUP - NO STRAWS. A more timely pharyngeal swallow was noted at the level of valleculae w/ trials of puree and soft solids moistened well. Of note, pt was able to follow through w/ the strategies but required verbal cues.      CHL IP CERVICAL ESOPHAGEAL PHASE 04/24/2018  Cervical Esophageal Phase Impaired  Pudding Teaspoon --  Pudding Cup --  Honey Teaspoon --  Honey Cup --  Nectar Teaspoon --  Nectar Cup --  Nectar Straw --  Thin Teaspoon --  Thin Cup --  Thin Straw --  Puree --  Mechanical Soft --  Regular --  Multi-consistency --  Pill --  Cervical Esophageal Comment pt endorses Reflux; he manages his Reflux w/ Nexium 2x daily.     CHL IP GO 09/09/2015  Functional Assessment Tool Used CLINICAL JUDGEMENT  Functional Limitations Swallowing  Swallow Current Status (F2924) CI  Swallow Goal Status (M6286) CI  Swallow Discharge Status (N8177) CI  Motor Speech Current Status (N1657) (None)  Motor Speech Goal Status (X0383) (None)  Motor Speech Goal Status (F3832) (None)  Spoken Language Comprehension Current Status (N1916) (None)  Spoken Language Comprehension Goal Status (O0600) (None)  Spoken Language Comprehension Discharge Status [phone]) (None)  Spoken Language Expression Current Status [phone]) (None)  Spoken Language Expression Goal Status (L9532) (None)  Spoken Language Expression Discharge Status [phone]) (None)  Attention Current Status (D5686) (None)  Attention Goal Status (H6837) (None)  Attention Discharge Status [phone]) (None)  Memory Current Status (J1552) (None)  Memory Goal Status (C8022) (None)  Memory Discharge Status  (V3612) (None)  Voice Current Status (A4497) (None)  Voice Goal Status (N3005) (None)  Voice Discharge Status (R1021) (None)  Other Speech-Language Pathology Functional Limitation Current Status (R1735) (None)  Other Speech-Language Pathology Functional Limitation Goal Status (A7014) (None)  Other Speech-Language Pathology Functional Limitation Discharge Status [phone]) (None)        Orinda Kenner, MS, CCC-SLP Concetta Guion 04/24/2018, 5:28 PM

## 2018-04-24 NOTE — Progress Notes (Signed)
Inpatient Diabetes Program Recommendations  AACE/ADA: New Consensus Statement on Inpatient Glycemic Control (2015)  Target Ranges:  Prepandial:   less than 140 mg/dL      Peak postprandial:   less than 180 mg/dL (1-2 hours)      Critically ill patients:  140 - 180 mg/dL   Lab Results  Component Value Date   GLUCAP 172 (H) 04/24/2018   HGBA1C 6.5 09/14/2017    Review of Glycemic ControlResults for PRANEETH, BUSSEY (MRN [id number]) as of 04/24/2018 11:00  Ref. Range 04/23/2018 07:24 04/23/2018 11:38 04/23/2018 13:46 04/23/2018 16:22 04/23/2018 20:06 04/24/2018 00:31 04/24/2018 04:12 04/24/2018 07:45  Glucose-Capillary Latest Ref Range: 65 - 99 mg/dL 143 (H) 172 (H) 271 (H) 204 (H) 145 (H) 200 (H) 185 (H) 172 (H)    Diabetes history: Type 2 DM  Outpatient Diabetes medications: Novolog 14 units tid with meals, Januvia 100 mg, Victoza 1.8 mg daily, Metformin 1000 mg bid, Lantus 30 units bid Current orders for Inpatient glycemic control:   Novolog resistant q 4 hours, Lantus 30 units bid, Novolog 14 units tid with  meals Inpatient Diabetes Program Recommendations:   Please reduce Novolog correction to tid with meals and HS (instead of q 4 hours).   Thanks,  Adah Perl, RN, BC-ADM Inpatient Diabetes Coordinator Pager [phone] (8a-5p)

## 2018-04-24 NOTE — Progress Notes (Signed)
Pharmacy Antibiotic Note  Jerry Parsons is a 67 y.o. male admitted on 04/22/2018 with bacteremia.  Pharmacy has been consulted for vancomycin dosing. Unasyn added 4/29 for aspiration PNA coverage as pt spiking fevers.   Plan: Vancomycin vancomycin 1250mg  IV every 12 hours.  Goal trough 15-20 mcg/mL.  T1/2 11.55 hrs estimated  4/29@1230  vancomycin trough before 4th dose  4/29 vanc trough at 1340 = 12 mcg/ml - drawn one hour late so true trough likely a little higher. Will increase dose to vancomycin 1500 mg IV q12h and recheck trough before 4th new dose.    Unasyn 3 g IV q6h.    Height: 5\' 11"  (180.3 cm) Weight: 276 lb 7.3 oz (125.4 kg) IBW/kg (Calculated) : 75.3  Temp (24hrs), Avg:99.6 F (37.6 C), Min:98.5 F (36.9 C), Max:102.5 F (39.2 C)  Recent Labs  Lab 04/22/18 0254 04/22/18 0447 04/23/18 0721 04/24/18 0404 04/24/18 1340  WBC 13.5*  --  8.1  --   --   CREATININE 1.42*  --  0.99 1.12  --   LATICACIDVEN 2.9* 2.0*  --  1.3  --   VANCOTROUGH  --   --   --   --  12*    Estimated Creatinine Clearance: 85.1 mL/min (by C-G formula based on SCr of 1.12 mg/dL).    Allergies  Allergen Reactions  . Carisoprodol Itching    Antimicrobials this admission: Vanc 4/27 >> CTX 4/27 Azithro 4/27 Doxy 4/27 Unasyn 4/29 >>  Microbiology: 4/27 BCx AM 1/2 Staph aureus 4/27 BCx PM 2/2 NGTD  4/29 BCx x2 sent 4/27 UCx NG MRSA PCR neg Thank you for allowing pharmacy to be a part of this patient's care.  Rocky Morel 04/24/2018 3:35 PM

## 2018-04-24 NOTE — Consult Note (Signed)
[facility] Infectious [facility]     Reason for Consult:  MRSA bacteremia   Referring Physician: Karlton Lemon Date of Admission:  04/22/2018   Active Problems:   Sepsis due to pneumonia [facility])   HPI: Jerry Parsons is a 67 y.o. male admitted with fevers and chills and 1-2 days cough and SOB. He has hx aspiration of unknown etiology and cxr showed possible R sided infiltrate. He was also having L sided post rib pain. He has a hx of R TKR and several back surgeries with hardware in place.  On admit temp 103, wbc 13. He reports that he had cut his finger a few weeks ago and it had become infected but he treated it with topical abx.  Currently he reports feeling stronger since admission, but bcx now + MRSA. He denies current cough and has no sputum production,   Past Medical History:  Diagnosis Date  . Arrhythmia    tachycardia, A-Fib  . Arthritis   . Blind right eye   . BPH (benign prostatic hyperplasia)   . Diabetes mellitus   . DVT (deep venous thrombosis) ([place]) 02/2010   leg thrombus ; dislodged into emboli and caused PE  . Dyspnea   . Dysrhythmia   . 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 GERD (gastroesophageal reflux disease)   . Hypercholesteremia   . Hypertension   . Kidney failure   . Neuromuscular disorder ([place])   . Neuropathy   . Pneumonia    time 9 ;last episode 12/2015  . Pulmonary embolus ([place]) 2011  . Seasonal allergies   . Seizures ([place])    as child   . Sleep apnea    BIPAP  . Stiff neck    limited turning s/p titanium plate placement  . Stroke ([place])   . TIA (transient ischemic attack)   . Wears dentures    full upper and lower   Past Surgical History:  Procedure Laterality Date  . APPENDECTOMY    . BACK SURGERY     x 8; upper x 3 & lower x 5  . CARDIOVERSION  03/14/13, 10/16   2014 - [place], 2016 - [place]  . CATARACT EXTRACTION W/PHACO Left 10/29/2015   Procedure: CATARACT EXTRACTION PHACO AND INTRAOCULAR LENS PLACEMENT (IOC);  Surgeon:  Leandrew Koyanagi, MD;  Location: [place];  Service: Ophthalmology;  Laterality: Left;  DIABETIC - insulin and oral medsSleep apnea - no machine  . CHOLECYSTECTOMY    . COLONOSCOPY WITH PROPOFOL N/A 01/16/2018   Procedure: COLONOSCOPY WITH PROPOFOL;  Surgeon: Manya Silvas, MD;  Location: [facility] ENDOSCOPY;  Service: Endoscopy;  Laterality: N/A;  . ESOPHAGOGASTRODUODENOSCOPY (EGD) WITH PROPOFOL N/A 01/16/2018   Procedure: ESOPHAGOGASTRODUODENOSCOPY (EGD) WITH PROPOFOL;  Surgeon: Manya Silvas, MD;  Location: [facility] ENDOSCOPY;  Service: Endoscopy;  Laterality: N/A;  . EYE SURGERY    . GALLBLADDER SURGERY  2002  . JOINT REPLACEMENT Right 2018  . KNEE ARTHROSCOPY     left   . ROTATOR CUFF REPAIR  2001   left   . TONSILLECTOMY    . TOTAL KNEE ARTHROPLASTY Right 06/20/2017   Procedure: RIGHT TOTAL KNEE ARTHROPLASTY;  Surgeon: Gaynelle Arabian, MD;  Location: [facility];  Service: Orthopedics;  Laterality: Right;   Social History   Tobacco Use  . Smoking status: Former Smoker    Packs/day: 2.00    Years: 20.00    Pack years: 40.00    Types: Cigarettes    Last  attempt to quit: 12/26/1989    Years since quitting: 28.3  . Smokeless tobacco: Never Used  Substance Use Topics  . Alcohol use: No  . Drug use: No   Family History  Problem Relation Age of Onset  . Heart attack Brother     Allergies:  Allergies  Allergen Reactions  . Carisoprodol Itching    Current antibiotics: Antibiotics Given (last 72 hours)    Date/Time Action Medication Dose Rate   04/22/18 0302 New Bag/Given   azithromycin (ZITHROMAX) 500 mg in sodium chloride 0.9 % 250 mL IVPB 500 mg 250 mL/hr   04/22/18 0326 New Bag/Given   cefTRIAXone (ROCEPHIN) 2 g in sodium chloride 0.9 % 100 mL IVPB 2 g 200 mL/hr   04/22/18 1706 New Bag/Given   cefTRIAXone (ROCEPHIN) 1 g in sodium chloride 0.9 % 100 mL IVPB 1 g 200 mL/hr   04/22/18 1757 New Bag/Given   doxycycline (VIBRAMYCIN) 100 mg in sodium chloride 0.9 %  250 mL IVPB 100 mg 125 mL/hr   04/22/18 2117 New Bag/Given   vancomycin (VANCOCIN) 2,000 mg in sodium chloride 0.9 % 500 mL IVPB 2,000 mg 250 mL/hr   04/23/18 0206 New Bag/Given   vancomycin (VANCOCIN) 1,250 mg in sodium chloride 0.9 % 250 mL IVPB 1,250 mg 166.7 mL/hr   04/23/18 1350 New Bag/Given   vancomycin (VANCOCIN) 1,250 mg in sodium chloride 0.9 % 250 mL IVPB 1,250 mg 166.7 mL/hr   04/24/18 0139 New Bag/Given   vancomycin (VANCOCIN) 1,250 mg in sodium chloride 0.9 % 250 mL IVPB 1,250 mg 166.7 mL/hr   04/24/18 1336 New Bag/Given   vancomycin (VANCOCIN) 1,250 mg in sodium chloride 0.9 % 250 mL IVPB 1,250 mg 166.7 mL/hr      MEDICATIONS: . amiodarone  200 mg Oral Daily  . aspirin EC  81 mg Oral Daily  . atorvastatin  40 mg Oral q1800  . chlorhexidine  15 mL Mouth Rinse BID  . cyclobenzaprine  10 mg Oral BID  . dutasteride  0.5 mg Oral Daily  . enoxaparin (LOVENOX) injection  40 mg Subcutaneous Q24H  . famotidine  20 mg Oral Daily  . furosemide  40 mg Oral Daily  . hydrochlorothiazide  25 mg Oral QHS  . insulin aspart  0-20 Units Subcutaneous TID WC  . insulin aspart  0-5 Units Subcutaneous QHS  . insulin aspart  14 Units Subcutaneous TID WC  . insulin glargine  30 Units Subcutaneous BID  . ipratropium-albuterol  3 mL Nebulization Q6H  . mouth rinse  15 mL Mouth Rinse BID  . metoprolol tartrate  100 mg Oral BID  . multivitamin with minerals  1 tablet Oral Daily  . oxyCODONE  40 mg Oral Q8H  . pantoprazole  40 mg Oral Daily  . pregabalin  200 mg Oral BID  . pregabalin  300 mg Oral QHS  . sucralfate  1 g Oral TID WC & HS    Review of Systems - 11 systems reviewed and negative per HPI   OBJECTIVE: Temp:  [98.5 F (36.9 C)-102.5 F (39.2 C)] 99.6 F (37.6 C) (04/29 1332) Pulse Rate:  [82-130] 94 (04/29 1247) Resp:  [18-27] 18 (04/29 1247) BP: (125-156)/(55-81) 139/68 (04/29 1247) SpO2:  [90 %-98 %] 94 % (04/29 1411) FiO2 (%):  [30 %] 30 % (04/29 0156) Physical Exam   Constitutional: He is oriented to person, place, and time. Obese. On 4L o2 HENT: anicteric Mouth/Throat: Oropharynx is clear and moist. No oropharyngeal exudate.  Cardiovascular:  Normal rate, regular rhythm 2/6 sm Pulmonary/Chest: bil rhonchi Abdominal: Soft. Bowel sounds are normal. He exhibits no distension. There is no tenderness.  Lymphadenopathy: He has no cervical adenopathy.  Neurological: He is alert and oriented to person, place, and time.  Skin: Skin is warm and dry. No rash noted. No erythema.  Psychiatric: He has a normal mood and affect. His behavior is normal.  Ext 1+ edema bil le   LABS: Results for orders placed or performed during the hospital encounter of 04/22/18 (from the past 48 hour(s))  Glucose, capillary     Status: Abnormal   Collection Time: 04/22/18  4:10 PM  Result Value Ref Range   Glucose-Capillary 161 (H) 65 - 99 mg/dL  Glucose, capillary     Status: None   Collection Time: 04/22/18  8:37 PM  Result Value Ref Range   Glucose-Capillary 98 65 - 99 mg/dL  Culture, blood (Routine X 2) w Reflex to ID Panel     Status: None (Preliminary result)   Collection Time: 04/22/18 11:33 PM  Result Value Ref Range   Specimen Description BLOOD LEFT ANTECUBITAL    Special Requests      BOTTLES DRAWN AEROBIC AND ANAEROBIC Blood Culture adequate volume   Culture      NO GROWTH < 12 HOURS Performed at [facility], [address] [place], [place] [address]    Report Status PENDING   Culture, blood (Routine X 2) w Reflex to ID Panel     Status: None (Preliminary result)   Collection Time: 04/22/18 11:43 PM  Result Value Ref Range   Specimen Description BLOOD LEFT HAND    Special Requests      BOTTLES DRAWN AEROBIC AND ANAEROBIC Blood Culture results may not be optimal due to an excessive volume of blood received in culture bottles   Culture      NO GROWTH < 12 HOURS Performed at [facility], [address] [place], [place] [address]     Report Status PENDING   Glucose, capillary     Status: Abnormal   Collection Time: 04/23/18 12:23 AM  Result Value Ref Range   Glucose-Capillary 112 (H) 65 - 99 mg/dL  Glucose, capillary     Status: Abnormal   Collection Time: 04/23/18  3:58 AM  Result Value Ref Range   Glucose-Capillary 139 (H) 65 - 99 mg/dL  Procalcitonin     Status: None   Collection Time: 04/23/18  7:21 AM  Result Value Ref Range   Procalcitonin 11.56 ng/mL    Comment:        Interpretation: PCT >= 10 ng/mL: Important systemic inflammatory response, almost exclusively due to severe bacterial sepsis or septic shock. (NOTE)       Sepsis PCT Algorithm           Lower Respiratory Tract                                      Infection PCT Algorithm    ----------------------------     ----------------------------         PCT < 0.25 ng/mL                PCT < 0.10 ng/mL         Strongly encourage             Strongly discourage   discontinuation of antibiotics    initiation  of antibiotics    ----------------------------     -----------------------------       PCT 0.25 - 0.50 ng/mL            PCT 0.10 - 0.25 ng/mL               OR       >80% decrease in PCT            Discourage initiation of                                            antibiotics      Encourage discontinuation           of antibiotics    ----------------------------     -----------------------------         PCT >= 0.50 ng/mL              PCT 0.26 - 0.50 ng/mL                AND       <80% decrease in PCT             Encourage initiation of                                             antibiotics       Encourage continuation           of antibiotics    ----------------------------     -----------------------------        PCT >= 0.50 ng/mL                  PCT > 0.50 ng/mL               AND         increase in PCT                  Strongly encourage                                      initiation of antibiotics    Strongly encourage escalation            of antibiotics                                     -----------------------------                                           PCT <= 0.25 ng/mL                                                 OR                                        >  80% decrease in PCT                                     Discontinue / Do not initiate                                             antibiotics Performed at [facility], [address] [place], [place] [address]   Basic metabolic panel     Status: Abnormal   Collection Time: 04/23/18  7:21 AM  Result Value Ref Range   Sodium 138 135 - 145 mmol/L   Potassium 3.5 3.5 - 5.1 mmol/L   Chloride 105 101 - 111 mmol/L   CO2 29 22 - 32 mmol/L   Glucose, Bld 144 (H) 65 - 99 mg/dL   BUN 16 6 - 20 mg/dL   Creatinine, Ser 0.99 0.61 - 1.24 mg/dL   Calcium 8.5 (L) 8.9 - 10.3 mg/dL   GFR calc non Af Amer >60 >60 mL/min   GFR calc Af Amer >60 >60 mL/min    Comment: (NOTE) The eGFR has been calculated using the CKD EPI equation. This calculation has not been validated in all clinical situations. eGFR's persistently <60 mL/min signify possible Chronic Kidney Disease.    Anion gap 4 (L) 5 - 15    Comment: Performed at [facility], [address] [place], [place] [address]  CBC     Status: Abnormal   Collection Time: 04/23/18  7:21 AM  Result Value Ref Range   WBC 8.1 3.8 - 10.6 K/uL   RBC 4.08 (L) 4.40 - 5.90 MIL/uL   Hemoglobin 10.1 (L) 13.0 - 18.0 g/dL    Comment: RESULT REPEATED AND VERIFIED   HCT 31.5 (L) 40.0 - 52.0 %   MCV 77.3 (L) 80.0 - 100.0 fL   MCH 24.7 (L) 26.0 - 34.0 pg   MCHC 31.9 (L) 32.0 - 36.0 g/dL   RDW 18.3 (H) 11.5 - 14.5 %   Platelets 86 (L) 150 - 440 K/uL    Comment: Performed at [facility], [address] [place], [place] [address]  Sedimentation rate     Status: Abnormal   Collection Time: 04/23/18  7:21 AM  Result Value Ref Range   Sed Rate 57 (H) 0 - 20 mm/hr    Comment: Performed at [facility], [address] [place], [place] [address]  C-reactive protein     Status: Abnormal   Collection Time: 04/23/18  7:21 AM  Result Value Ref Range   CRP 16.2 (H) <1.0 mg/dL    Comment: Performed at [facility], [place] [address], [place], [place] [address]  Glucose, capillary     Status: Abnormal   Collection Time: 04/23/18  7:24 AM  Result Value Ref Range   Glucose-Capillary 143 (H) 65 - 99 mg/dL  Glucose, capillary     Status: Abnormal   Collection Time: 04/23/18 11:38 AM  Result Value Ref Range   Glucose-Capillary 172 (H) 65 - 99 mg/dL  Glucose, capillary     Status: Abnormal   Collection Time: 04/23/18  1:46 PM  Result Value Ref Range   Glucose-Capillary 271 (H) 65 - 99 mg/dL  Glucose, capillary     Status: Abnormal   Collection Time: 04/23/18  4:22 PM  Result Value Ref Range   Glucose-Capillary 204 (H) 65 - 99 mg/dL   Comment 1 Notify RN   Glucose, capillary     Status: Abnormal   Collection Time: 04/23/18  8:06 PM  Result Value Ref Range   Glucose-Capillary 145 (H) 65 - 99 mg/dL  Glucose, capillary     Status: Abnormal   Collection Time: 04/24/18 12:31 AM  Result Value Ref Range   Glucose-Capillary 200 (H) 65 - 99 mg/dL  Procalcitonin     Status: None   Collection Time: 04/24/18  4:04 AM  Result Value Ref Range   Procalcitonin 6.79 ng/mL    Comment:        Interpretation: PCT > 2 ng/mL: Systemic infection (sepsis) is likely, unless other causes are known. (NOTE)       Sepsis PCT Algorithm           Lower Respiratory Tract                                      Infection PCT Algorithm    ----------------------------     ----------------------------         PCT < 0.25 ng/mL                PCT < 0.10 ng/mL         Strongly encourage             Strongly discourage   discontinuation of antibiotics    initiation of antibiotics    ----------------------------     -----------------------------       PCT 0.25 - 0.50 ng/mL            PCT 0.10 - 0.25 ng/mL                OR       >80% decrease in PCT            Discourage initiation of                                            antibiotics      Encourage discontinuation           of antibiotics    ----------------------------     -----------------------------         PCT >= 0.50 ng/mL              PCT 0.26 - 0.50 ng/mL               AND       <80% decrease in PCT              Encourage initiation of                                             antibiotics       Encourage continuation           of antibiotics    ----------------------------     -----------------------------        PCT >= 0.50 ng/mL                  PCT > 0.50 ng/mL  AND         increase in PCT                  Strongly encourage                                      initiation of antibiotics    Strongly encourage escalation           of antibiotics                                     -----------------------------                                           PCT <= 0.25 ng/mL                                                 OR                                        > 80% decrease in PCT                                     Discontinue / Do not initiate                                             antibiotics Performed at [facility], [address] [place], [place] [address]   Basic metabolic panel     Status: Abnormal   Collection Time: 04/24/18  4:04 AM  Result Value Ref Range   Sodium 136 135 - 145 mmol/L   Potassium 3.8 3.5 - 5.1 mmol/L   Chloride 100 (L) 101 - 111 mmol/L   CO2 30 22 - 32 mmol/L   Glucose, Bld 191 (H) 65 - 99 mg/dL   BUN 15 6 - 20 mg/dL   Creatinine, Ser 1.12 0.61 - 1.24 mg/dL   Calcium 9.1 8.9 - 10.3 mg/dL   GFR calc non Af Amer >60 >60 mL/min   GFR calc Af Amer >60 >60 mL/min    Comment: (NOTE) The eGFR has been calculated using the CKD EPI equation. This calculation has not been validated in all clinical situations. eGFR's persistently <60 mL/min signify possible Chronic  Kidney Disease.    Anion gap 6 5 - 15    Comment: Performed at [facility], [address] [place], [place] [address]  Magnesium     Status: None   Collection Time: 04/24/18  4:04 AM  Result Value Ref Range   Magnesium 1.8 1.7 - 2.4 mg/dL    Comment: Performed at [facility], [address] [place], [place] [address]  Lactic acid, plasma     Status: None   Collection Time: 04/24/18  4:04 AM  Result Value  Ref Range   Lactic Acid, Venous 1.3 0.5 - 1.9 mmol/L    Comment: Performed at [facility], [address] [place], [place] [address]  Platelet count     Status: Abnormal   Collection Time: 04/24/18  4:04 AM  Result Value Ref Range   Platelets 110 (L) 150 - 440 K/uL    Comment: Performed at [facility], [address] [place], [place] [address]  Glucose, capillary     Status: Abnormal   Collection Time: 04/24/18  4:12 AM  Result Value Ref Range   Glucose-Capillary 185 (H) 65 - 99 mg/dL  Glucose, capillary     Status: Abnormal   Collection Time: 04/24/18  7:45 AM  Result Value Ref Range   Glucose-Capillary 172 (H) 65 - 99 mg/dL   Comment 1 Notify RN   Glucose, capillary     Status: Abnormal   Collection Time: 04/24/18 12:30 PM  Result Value Ref Range   Glucose-Capillary 297 (H) 65 - 99 mg/dL   Comment 1 Notify RN   Vancomycin, trough     Status: Abnormal   Collection Time: 04/24/18  1:40 PM  Result Value Ref Range   Vancomycin Tr 12 (L) 15 - 20 ug/mL    Comment: Performed at [facility], [address] [place], [place] [address]   No components found for: ESR, C REACTIVE PROTEIN MICRO: Recent Results (from the past 720 hour(s))  Blood Culture (routine x 2)     Status: None (Preliminary result)   Collection Time: 04/22/18  2:54 AM  Result Value Ref Range Status   Specimen Description BLOOD BLOOD RIGHT HAND  Final   Special Requests   Final    BOTTLES DRAWN AEROBIC AND ANAEROBIC Blood Culture adequate volume    Culture   Final    NO GROWTH 1 DAY Performed at [facility], [address] [place], [place] [address]    Report Status PENDING  Incomplete  Blood Culture (routine x 2)     Status: Abnormal (Preliminary result)   Collection Time: 04/22/18  2:54 AM  Result Value Ref Range Status   Specimen Description   Final    BLOOD RIGHT ANTECUBITAL Performed at [facility], [address], [place], [place] [address]    Special Requests   Final    BOTTLES DRAWN AEROBIC AND ANAEROBIC Blood Culture adequate volume Performed at [facility], [address] [place], [place] [address]    Culture  Setup Time   Final    ANAEROBIC BOTTLE ONLY GRAM POSITIVE COCCI IN CLUSTERS CRITICAL RESULT CALLED TO, READ BACK BY AND VERIFIED WITH: GARRETT COFFEE AT 1956 04/22/18.PMH    Culture (A)  Final    STAPHYLOCOCCUS AUREUS SUSCEPTIBILITIES TO FOLLOW Performed at [facility], [place] [address], [place],  [address]    Report Status PENDING  Incomplete  Blood Culture ID Panel (Reflexed)     Status: Abnormal   Collection Time: 04/22/18  2:54 AM  Result Value Ref Range Status   Enterococcus species NOT DETECTED NOT DETECTED Final   Listeria monocytogenes NOT DETECTED NOT DETECTED Final   Staphylococcus species DETECTED (A) NOT DETECTED Final    Comment: CRITICAL RESULT CALLED TO, READ BACK BY AND VERIFIED WITH: GARRETT COFFEE AT 1956 04/22/18.PMH    Staphylococcus aureus DETECTED (A) NOT DETECTED Final    Comment: Methicillin (oxacillin)-resistant Staphylococcus aureus (MRSA). MRSA is predictably resistant to beta-lactam antibiotics (except ceftaroline). Preferred therapy is vancomycin unless  clinically contraindicated. Patient requires contact precautions if  hospitalized. CRITICAL RESULT CALLED TO, READ BACK BY AND VERIFIED WITH: GARRETT COFFEE AT 1956 04/22/18.PMH    Methicillin resistance DETECTED (A) NOT DETECTED Final    Comment: CRITICAL RESULT CALLED TO, READ  BACK BY AND VERIFIED WITH: GARRETT COFFEE AT 1956 04/22/18.PMH    Streptococcus species NOT DETECTED NOT DETECTED Final   Streptococcus agalactiae NOT DETECTED NOT DETECTED Final   Streptococcus pneumoniae NOT DETECTED NOT DETECTED Final   Streptococcus pyogenes NOT DETECTED NOT DETECTED Final   Acinetobacter baumannii NOT DETECTED NOT DETECTED Final   Enterobacteriaceae species NOT DETECTED NOT DETECTED Final   Enterobacter cloacae complex NOT DETECTED NOT DETECTED Final   Escherichia coli NOT DETECTED NOT DETECTED Final   Klebsiella oxytoca NOT DETECTED NOT DETECTED Final   Klebsiella pneumoniae NOT DETECTED NOT DETECTED Final   Proteus species NOT DETECTED NOT DETECTED Final   Serratia marcescens NOT DETECTED NOT DETECTED Final   Haemophilus influenzae NOT DETECTED NOT DETECTED Final   Neisseria meningitidis NOT DETECTED NOT DETECTED Final   Pseudomonas aeruginosa NOT DETECTED NOT DETECTED Final   Candida albicans NOT DETECTED NOT DETECTED Final   Candida glabrata NOT DETECTED NOT DETECTED Final   Candida krusei NOT DETECTED NOT DETECTED Final   Candida parapsilosis NOT DETECTED NOT DETECTED Final   Candida tropicalis NOT DETECTED NOT DETECTED Final    Comment: Performed at [facility], [address] [place], [place] [address]  Urine culture     Status: None   Collection Time: 04/22/18  2:55 AM  Result Value Ref Range Status   Specimen Description   Final    URINE, RANDOM Performed at [facility], [address], [place], [place] [address]    Special Requests   Final    NONE Performed at [facility], [address] [place], [place] [address]    Culture   Final    NO GROWTH Performed at [facility], [place] [address], [place], [place] [address]    Report Status 04/23/2018 FINAL  Final  MRSA PCR Screening     Status: None   Collection Time: 04/22/18  5:53 AM  Result Value Ref Range Status   MRSA by PCR NEGATIVE NEGATIVE Final     Comment:        The GeneXpert MRSA Assay (FDA approved for NASAL specimens only), is one component of a comprehensive MRSA colonization surveillance program. It is not intended to diagnose MRSA infection nor to guide or monitor treatment for MRSA infections. Performed at [facility], [address] [place], [place] [address]   Culture, blood (Routine X 2) w Reflex to ID Panel     Status: None (Preliminary result)   Collection Time: 04/22/18 11:33 PM  Result Value Ref Range Status   Specimen Description BLOOD LEFT ANTECUBITAL  Final   Special Requests   Final    BOTTLES DRAWN AEROBIC AND ANAEROBIC Blood Culture adequate volume   Culture   Final    NO GROWTH < 12 HOURS Performed at [facility], [address] [place] [address] [place], [place] [address]    Report Status PENDING  Incomplete  Culture, blood (Routine X 2) w Reflex to ID Panel     Status: None (Preliminary result)   Collection Time: 04/22/18 11:43 PM  Result Value Ref Range Status   Specimen Description BLOOD LEFT HAND  Final   Special Requests   Final    BOTTLES DRAWN AEROBIC AND  ANAEROBIC Blood Culture results may not be optimal due to an excessive volume of blood received in culture bottles   Culture   Final    NO GROWTH < 12 HOURS Performed at [facility], [address], [place], [place] [address]    Report Status PENDING  Incomplete    IMAGING: Dg Chest Port 1 View  Result Date: 04/22/2018 CLINICAL DATA:  Fever and left-sided back and flank pain. History of pneumonia. Cough and shortness of breath. EXAM: PORTABLE CHEST 1 VIEW COMPARISON:  07/13/2017 FINDINGS: Shallow inspiration with atelectasis in the lung bases. Mild cardiac enlargement. No vascular congestion or edema. Slight infiltration in the right lung base may reflect pneumonia. No blunting of costophrenic angles. No pneumothorax. Postoperative changes in the cervical spine. IMPRESSION: Cardiac enlargement. Focal infiltration in the  right lung base may indicate pneumonia. Electronically Signed   By: Lucienne Capers M.D.   On: 04/22/2018 03:32    Assessment:   Jerry Parsons is a 67 y.o. 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   He is also having some L sided post chest wall pain and some slightl worsening of his chronic lbp.   CXR with possible R base infiltrate but unclear.  FU bcx done 4/29. TTE was poor study but no evidence of vegetation.   I do not think it likely he has aspiration PNA at this time but may have become bacteremic from his recent finger infection. 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  Will need full wu for endocarditis, PICC placement when stable and min 4 weeks IV abx.  Recommendations Continue vancomycin  Repeat bcx is pending. When negative x 48 hours can place PICC line When he improves from respiratory viewpoint would perform TEE. Currently on 4 L O2- not on O2 at home. Discussed with patient and wife need for several weeks IV abx given severity of MRSA bacteremia and risks associated with it.  Thank you very much for allowing me to participate in the care of this patient. Please call with questions.   Cheral Marker. Ola Spurr, MD

## 2018-04-24 NOTE — Progress Notes (Signed)
[facility]      DISCUSSION: 67 year old male with a history of dysphagia presenting with recurrent pneumonia most likely aspiration with superimposed bacterial infection  ASSESSMENT ID panel showed methicillin resistance, blood culture showed gram-positive cocci, identification pending. Obstructive sleep apnea, GERD dysphagia.  PLAN Continue empiric antibiotics. - Wean down oxygen as tolerated. -Continue nightly BiPAP 14/9. - Patient was given my card, will need outpatient follow-up.   Date: 04/24/2018  MRN# [id number] Jerry Parsons [birth year]   Jerry Parsons is a 67 y.o. old male seen in follow up for chief complaint of  Chief Complaint  Patient presents with  . Fever     HPI:   Patient is a 67 year old male, former smoker Quit several years ago.  He also has a history of working in [place], no previous history of respiratory disease, denies any previous history of asthma or COPD.  Patient was admitted to the hospital with pneumonia, wife at bedside says he has had several bouts of pneumonia over the last few years.  Pt currently on [place] at 4L.   The patient has no history of sleep apnea, status post sleep study on 09/13/2015, patient required BiPAP 14/9.  Blood cultures on 4/27 showed gram-positive cocci in clusters, ID panel positive for staph aureus, staph species, methicillin resistance. ABX:  Vancomycin. 4/28>>    Imaging personally reviewed, chest x-ray the 04/22/2018, shows reduced lung volumes, cardiomegaly.  Bibasilar atelectasis.  Previous two-view chest x-ray on 07/13/2017, showed normal heart with slight right heart enlargement.  Medication:    Current Facility-Administered Medications:  .  acetaminophen (TYLENOL) tablet 650 mg, 650 mg, Oral, Q6H PRN, Arta Silence, MD, 650 mg at 04/24/18 1249 .  amiodarone (PACERONE) tablet 200 mg, 200 mg, Oral, Daily, Jodell Cipro, Prasanna, MD, 200 mg at 04/24/18 0854 .  aspirin EC tablet 81 mg,  81 mg, Oral, Daily, Sridharan, Prasanna, MD, 81 mg at 04/24/18 1012 .  atorvastatin (LIPITOR) tablet 40 mg, 40 mg, Oral, q1800, Arta Silence, MD, 40 mg at 04/23/18 1850 .  bisacodyl (DULCOLAX) EC tablet 5 mg, 5 mg, Oral, Daily PRN, Jodell Cipro, Prasanna, MD .  chlorhexidine (PERIDEX) 0.12 % solution 15 mL, 15 mL, Mouth Rinse, BID, Tukov-Yual, Magdalene S, NP, 15 mL at 04/24/18 0855 .  cyclobenzaprine (FLEXERIL) tablet 10 mg, 10 mg, Oral, BID, Lafayette Dragon, MD, 10 mg at 04/23/18 2326 .  dutasteride (AVODART) capsule 0.5 mg, 0.5 mg, Oral, Daily, Jodell Cipro, Prasanna, MD, 0.5 mg at 04/24/18 0855 .  enoxaparin (LOVENOX) injection 40 mg, 40 mg, Subcutaneous, Q24H, Wieting, Richard, MD, 40 mg at 04/24/18 0855 .  famotidine (PEPCID) tablet 20 mg, 20 mg, Oral, Daily, Jodell Cipro, Prasanna, MD, 20 mg at 04/24/18 0854 .  furosemide (LASIX) tablet 40 mg, 40 mg, Oral, Daily, Jodell Cipro, Prasanna, MD, 40 mg at 04/24/18 0855 .  hydrochlorothiazide (HYDRODIURIL) tablet 25 mg, 25 mg, Oral, QHS, Sridharan, Prasanna, MD, 25 mg at 04/23/18 2322 .  insulin aspart (novoLOG) injection 0-20 Units, 0-20 Units, Subcutaneous, Q4H, Lafayette Dragon, MD, 11 Units at 04/24/18 1242 .  insulin aspart (novoLOG) injection 14 Units, 14 Units, Subcutaneous, TID WC, Arta Silence, MD, 14 Units at 04/24/18 1241 .  insulin glargine (LANTUS) injection 30 Units, 30 Units, Subcutaneous, BID, Arta Silence, MD, 30 Units at 04/24/18 1011 .  ipratropium-albuterol (DUONEB) 0.5-2.5 (3) MG/3ML nebulizer solution 3 mL, 3 mL, Nebulization, Q6H, Tukov-Yual, Magdalene S, NP, 3 mL at 04/24/18 0745 .  MEDLINE mouth rinse, 15 mL, Mouth Rinse,  BID, Tukov-Yual, Magdalene S, NP, 15 mL at 04/24/18 0856 .  metoprolol tartrate (LOPRESSOR) tablet 100 mg, 100 mg, Oral, BID, Jodell Cipro, Prasanna, MD, 100 mg at 04/24/18 0854 .  multivitamin with minerals tablet 1 tablet, 1 tablet, Oral, Daily, Arta Silence, MD, 1 tablet at 04/24/18  0855 .  ondansetron (ZOFRAN) injection 4 mg, 4 mg, Intravenous, Q6H PRN, Jodell Cipro, Prasanna, MD .  oxyCODONE (Oxy IR/ROXICODONE) immediate release tablet 5 mg, 5 mg, Oral, Q4H PRN **OR** oxyCODONE (Oxy IR/ROXICODONE) immediate release tablet 10 mg, 10 mg, Oral, Q4H PRN, Jodell Cipro, Prasanna, MD .  oxyCODONE (OXYCONTIN) 12 hr tablet 40 mg, 40 mg, Oral, Q8H, Sridharan, Prasanna, MD, 40 mg at 04/24/18 0640 .  pantoprazole (PROTONIX) EC tablet 40 mg, 40 mg, Oral, Daily, Jodell Cipro, Prasanna, MD, 40 mg at 04/24/18 0853 .  pregabalin (LYRICA) capsule 200 mg, 200 mg, Oral, BID, Lafayette Dragon, MD, 200 mg at 04/24/18 0853 .  pregabalin (LYRICA) capsule 300 mg, 300 mg, Oral, QHS, Sridharan, Prasanna, MD, 300 mg at 04/23/18 2321 .  senna-docusate (Senokot-S) tablet 2 tablet, 2 tablet, Oral, BID PRN, Jodell Cipro, Prasanna, MD .  sucralfate (CARAFATE) tablet 1 g, 1 g, Oral, TID WC & HS, Jodell Cipro, Prasanna, MD, 1 g at 04/24/18 1111 .  vancomycin (VANCOCIN) 1,250 mg in sodium chloride 0.9 % 250 mL IVPB, 1,250 mg, Intravenous, Q12H, Coffee, Donna Christen [facility], Stopped at 04/24/18 0309   Allergies:  Carisoprodol  Review of Systems: Gen:  Denies  fever, sweats. HEENT: Denies blurred vision. Cvc:  No dizziness, chest pain or heaviness Other:  All other systems were reviewed and found to be negative other than what is mentioned in the HPI.   Physical Examination:   VS: BP 139/68   Pulse 94   Temp (!) 102.5 F (39.2 C) (Oral)   Resp 18   Ht 5\' 11"  (1.803 m)   Wt 276 lb 7.3 oz (125.4 kg)   SpO2 90%   BMI 38.56 kg/m    General Appearance: No distress  Neuro:without focal findings,  speech normal,  HEENT: PERRLA, EOM intact. Pulmonary: normal breath sounds, No wheezing.   CardiovascularNormal S1,S2.  No m/r/g.   Abdomen: Benign, Soft, non-tender. Renal:  No costovertebral tenderness  GU:  Not performed at this time. Endoc: No evident thyromegaly, no signs of acromegaly. Skin:   warm, no  rash. Extremities: normal, no cyanosis, clubbing.   LABORATORY PANEL:   CBC Recent Labs  Lab 04/23/18 0721 04/24/18 0404  WBC 8.1  --   HGB 10.1*  --   HCT 31.5*  --   PLT 86* 110*   ------------------------------------------------------------------------------------------------------------------  Chemistries  Recent Labs  Lab 04/22/18 0254  04/24/18 0404  NA 136   < > 136  K 3.6   < > 3.8  CL 98*   < > 100*  CO2 29   < > 30  GLUCOSE 359*   < > 191*  BUN 26*   < > 15  CREATININE 1.42*   < > 1.12  CALCIUM 9.5   < > 9.1  MG  --    < > 1.8  AST 45*  --   --   ALT 35  --   --   ALKPHOS 110  --   --   BILITOT 0.6  --   --    < > = values in this interval not displayed.   ------------------------------------------------------------------------------------------------------------------  Cardiac Enzymes Recent Labs  Lab 04/22/18 0641  TROPONINI 0.04*   ------------------------------------------------------------  RADIOLOGY:   No results found for this or any previous visit. Results for orders placed during the hospital encounter of 07/13/17  DG Chest 2 View   Narrative CLINICAL DATA:  Cough and congestion  EXAM: CHEST  2 VIEW  COMPARISON:  05/07/2017  FINDINGS: Cardiac shadow is within normal limits. The lungs are clear bilaterally. No focal infiltrate or sizable effusion is seen. Degenerative changes of the thoracic spine are noted. Postsurgical changes in the cervical and lumbar spine are noted as well.  IMPRESSION: No active cardiopulmonary disease.   Electronically Signed   By: Inez Catalina M.D.   On: 07/13/2017 15:01    ------------------------------------------------------------------------------------------------------------------  Thank  you for allowing [facility] to assist in the care of your patient. Our recommendations are noted above.  Please contact us if we can be of further service.   Marda Stalker,  MD.  [facility] Office Number: [phone]  Patricia Pesa, M.D.  Merton Border, M.D  04/24/2018

## 2018-04-24 NOTE — Progress Notes (Signed)
Patient ID: Jerry Parsons, male   DOB: October 24, 1951, 67 y.o.   MRN: [id number]  [employer] PROGRESS NOTE  ERIKA HUSSAR JEH:[id number] DOB: [birth year] DOA: 04/22/2018 PCP: Jerrol Banana., MD  HPI/Subjective: Patient feels okay.  Offers no complaints.  No cough or shortness of breath.  Had fever today.  Objective: Vitals:   04/24/18 1247 04/24/18 1332  BP: 139/68   Pulse: 94   Resp: 18   Temp: (!) 102.5 F (39.2 C) 99.6 F (37.6 C)  SpO2: 90%     Filed Weights   04/22/18 0535  Weight: 125.4 kg (276 lb 7.3 oz)    ROS: Review of Systems  Constitutional: Negative for chills and fever.  Eyes: Negative for blurred vision.  Respiratory: Positive for shortness of breath. Negative for cough.   Cardiovascular: Negative for chest pain.  Gastrointestinal: Negative for abdominal pain, constipation, diarrhea, nausea and vomiting.  Genitourinary: Negative for dysuria.  Musculoskeletal: Negative for joint pain.  Neurological: Negative for dizziness and headaches.   Exam: Physical Exam  Constitutional: He is oriented to person, place, and time.  HENT:  Nose: No mucosal edema.  Mouth/Throat: No oropharyngeal exudate or posterior oropharyngeal edema.  Eyes: Pupils are equal, round, and reactive to light. Conjunctivae, EOM and lids are normal.  Neck: No JVD present. Carotid bruit is not present. No edema present. No thyroid mass and no thyromegaly present.  Cardiovascular: S1 normal and S2 normal. Exam reveals no gallop.  No murmur heard. Pulses:      Dorsalis pedis pulses are 2+ on the right side, and 2+ on the left side.  Respiratory: No respiratory distress. He has decreased breath sounds in the right lower field. He has no wheezes. He has rhonchi in the right lower field. He has no rales.  GI: Soft. Bowel sounds are normal. There is no tenderness.  Musculoskeletal:       Right ankle: He exhibits swelling.       Left ankle: He exhibits swelling.  Lymphadenopathy:     He has no cervical adenopathy.  Neurological: He is alert and oriented to person, place, and time. No cranial nerve deficit.  Skin: Skin is warm. No rash noted. Nails show no clubbing.  Psychiatric: He has a normal mood and affect.      Data Reviewed: Basic Metabolic Panel: Recent Labs  Lab 04/22/18 0254 04/22/18 0641 04/23/18 0721 04/24/18 0404  NA 136  --  138 136  K 3.6  --  3.5 3.8  CL 98*  --  105 100*  CO2 29  --  29 30  GLUCOSE 359*  --  144* 191*  BUN 26*  --  16 15  CREATININE 1.42*  --  0.99 1.12  CALCIUM 9.5  --  8.5* 9.1  MG  --  1.8  --  1.8  PHOS  --  2.6  --   --    Liver Function Tests: Recent Labs  Lab 04/22/18 0254  AST 45*  ALT 35  ALKPHOS 110  BILITOT 0.6  PROT 7.3  ALBUMIN 3.9   CBC: Recent Labs  Lab 04/22/18 0254 04/23/18 0721 04/24/18 0404  WBC 13.5* 8.1  --   NEUTROABS 11.8*  --   --   HGB 12.9* 10.1*  --   HCT 39.7* 31.5*  --   MCV 75.6* 77.3*  --   PLT 133* 86* 110*   Cardiac Enzymes: Recent Labs  Lab 04/22/18 0254 04/22/18 0641  TROPONINI <0.03 0.04*  BNP (last 3 results) Recent Labs    04/22/18 0254  BNP 49.0     CBG: Recent Labs  Lab 04/23/18 2006 04/24/18 0031 04/24/18 0412 04/24/18 0745 04/24/18 1230  GLUCAP 145* 200* 185* 172* 297*    Recent Results (from the past 240 hour(s))  Blood Culture (routine x 2)     Status: None (Preliminary result)   Collection Time: 04/22/18  2:54 AM  Result Value Ref Range Status   Specimen Description BLOOD BLOOD RIGHT HAND  Final   Special Requests   Final    BOTTLES DRAWN AEROBIC AND ANAEROBIC Blood Culture adequate volume   Culture   Final    NO GROWTH 1 DAY Performed at [facility], [address] [place], [place] [address]    Report Status PENDING  Incomplete  Blood Culture (routine x 2)     Status: Abnormal (Preliminary result)   Collection Time: 04/22/18  2:54 AM  Result Value Ref Range Status   Specimen Description   Final    BLOOD RIGHT  ANTECUBITAL Performed at [facility], [address] [place], [place] [address]    Special Requests   Final    BOTTLES DRAWN AEROBIC AND ANAEROBIC Blood Culture adequate volume Performed at [facility], [address] [place], [place] [address]    Culture  Setup Time   Final    ANAEROBIC BOTTLE ONLY GRAM POSITIVE COCCI IN CLUSTERS CRITICAL RESULT CALLED TO, READ BACK BY AND VERIFIED WITH: [place] 04/22/18.PMH    Culture (A)  Final    STAPHYLOCOCCUS AUREUS SUSCEPTIBILITIES TO FOLLOW Performed at [facility], [place] [address], [place],  [address]    Report Status PENDING  Incomplete  Blood Culture ID Panel (Reflexed)     Status: Abnormal   Collection Time: 04/22/18  2:54 AM  Result Value Ref Range Status   Enterococcus species NOT DETECTED NOT DETECTED Final   Listeria monocytogenes NOT DETECTED NOT DETECTED Final   Staphylococcus species DETECTED (A) NOT DETECTED Final    Comment: CRITICAL RESULT CALLED TO, READ BACK BY AND VERIFIED WITH: GARRETT COFFEE AT 1956 04/22/18.PMH    Staphylococcus aureus DETECTED (A) NOT DETECTED Final    Comment: Methicillin (oxacillin)-resistant Staphylococcus aureus (MRSA). MRSA is predictably resistant to beta-lactam antibiotics (except ceftaroline). Preferred therapy is vancomycin unless clinically contraindicated. Patient requires contact precautions if  hospitalized. CRITICAL RESULT CALLED TO, READ BACK BY AND VERIFIED WITH: GARRETT COFFEE AT 1956 04/22/18.PMH    Methicillin resistance DETECTED (A) NOT DETECTED Final    Comment: CRITICAL RESULT CALLED TO, READ BACK BY AND VERIFIED WITH: GARRETT COFFEE AT 1956 04/22/18.PMH    Streptococcus species NOT DETECTED NOT DETECTED Final   Streptococcus agalactiae NOT DETECTED NOT DETECTED Final   Streptococcus pneumoniae NOT DETECTED NOT DETECTED Final   Streptococcus pyogenes NOT DETECTED NOT DETECTED Final   Acinetobacter baumannii NOT DETECTED NOT  DETECTED Final   Enterobacteriaceae species NOT DETECTED NOT DETECTED Final   Enterobacter cloacae complex NOT DETECTED NOT DETECTED Final   Escherichia coli NOT DETECTED NOT DETECTED Final   Klebsiella oxytoca NOT DETECTED NOT DETECTED Final   Klebsiella pneumoniae NOT DETECTED NOT DETECTED Final   Proteus species NOT DETECTED NOT DETECTED Final   Serratia marcescens NOT DETECTED NOT DETECTED Final   Haemophilus influenzae NOT DETECTED NOT DETECTED Final   Neisseria meningitidis NOT DETECTED NOT DETECTED Final   Pseudomonas aeruginosa NOT DETECTED NOT DETECTED Final   Candida albicans NOT DETECTED  NOT DETECTED Final   Candida glabrata NOT DETECTED NOT DETECTED Final   Candida krusei NOT DETECTED NOT DETECTED Final   Candida parapsilosis NOT DETECTED NOT DETECTED Final   Candida tropicalis NOT DETECTED NOT DETECTED Final    Comment: Performed at [facility], [address], [place], [place] [address]  Urine culture     Status: None   Collection Time: 04/22/18  2:55 AM  Result Value Ref Range Status   Specimen Description   Final    URINE, RANDOM Performed at [facility], [address], [place], [place] [address]    Special Requests   Final    NONE Performed at [facility], [address], [place], [place] [address]    Culture   Final    NO GROWTH Performed at [facility], [place] [address], [place], [place] [address]    Report Status 04/23/2018 FINAL  Final  MRSA PCR Screening     Status: None   Collection Time: 04/22/18  5:53 AM  Result Value Ref Range Status   MRSA by PCR NEGATIVE NEGATIVE Final    Comment:        The GeneXpert MRSA Assay (FDA approved for NASAL specimens only), is one component of a comprehensive MRSA colonization surveillance program. It is not intended to diagnose MRSA infection nor to guide or monitor treatment for MRSA infections. Performed at [facility], [address] [place], [place]  [address]   Culture, blood (Routine X 2) w Reflex to ID Panel     Status: None (Preliminary result)   Collection Time: 04/22/18 11:33 PM  Result Value Ref Range Status   Specimen Description BLOOD LEFT ANTECUBITAL  Final   Special Requests   Final    BOTTLES DRAWN AEROBIC AND ANAEROBIC Blood Culture adequate volume   Culture   Final    NO GROWTH < 12 HOURS Performed at [facility], [address] [place], [place] [address]    Report Status PENDING  Incomplete  Culture, blood (Routine X 2) w Reflex to ID Panel     Status: None (Preliminary result)   Collection Time: 04/22/18 11:43 PM  Result Value Ref Range Status   Specimen Description BLOOD LEFT HAND  Final   Special Requests   Final    BOTTLES DRAWN AEROBIC AND ANAEROBIC Blood Culture results may not be optimal due to an excessive volume of blood received in culture bottles   Culture   Final    NO GROWTH < 12 HOURS Performed at [facility], [address] [place], [place] [address]    Report Status PENDING  Incomplete     Scheduled Meds: . amiodarone  200 mg Oral Daily  . aspirin EC  81 mg Oral Daily  . atorvastatin  40 mg Oral q1800  . chlorhexidine  15 mL Mouth Rinse BID  . cyclobenzaprine  10 mg Oral BID  . dutasteride  0.5 mg Oral Daily  . enoxaparin (LOVENOX) injection  40 mg Subcutaneous Q24H  . famotidine  20 mg Oral Daily  . furosemide  40 mg Oral Daily  . hydrochlorothiazide  25 mg Oral QHS  . insulin aspart  0-20 Units Subcutaneous Q4H  . insulin aspart  14 Units Subcutaneous TID WC  . insulin glargine  30 Units Subcutaneous BID  . ipratropium-albuterol  3 mL Nebulization Q6H  . mouth rinse  15 mL Mouth Rinse BID  . metoprolol tartrate  100 mg Oral BID  .  multivitamin with minerals  1 tablet Oral Daily  . oxyCODONE  40 mg Oral Q8H  . pantoprazole  40 mg Oral Daily  . pregabalin  200 mg Oral BID  . pregabalin  300 mg Oral QHS  . sucralfate  1 g Oral TID WC & HS   Continuous  Infusions: . vancomycin 1,250 mg (04/24/18 1336)    Assessment/Plan:  1. Clinical sepsis present on admission secondary to pneumonia.  Lactic acidosis.  One blood  culture growing MRSA.  Antibiotics switched to vancomycin.  Consult infectious disease.  echocardiogram ordered.  Repeat blood cultures sent off.  Likely will end up needing PICC line and antibiotics longer-term.  Procalcitonin elevated. 2. Acute hypoxic respiratory failure.  Patient on 4 L of oxygen.  Try to taper. 3. History of sleep apnea on CPAP at night 4. History of esophageal stricture status post dilation last endoscopy 5. Type 2 diabetes mellitus on glargine insulin and sliding scale 6. Obesity with a BMI of 38.56 7. Essential hypertension on hydrochlorothiazide, metoprolol 8. Restless leg syndrome on Lyrica 9. Hyperlipidemia unspecified on atorvastatin 10. History of arrhythmia on amiodarone and metoprolol 11. Chronic pain on high-dose pain medications. 12. Barium swallow today  Code Status:     Code Status Orders  (From admission, onward)        Start     Ordered   04/22/18 0536  Full code  Continuous     04/22/18 0535    Code Status History    Date Active Date Inactive Code Status Order ID Comments User Context   06/20/2017 1035 06/22/2017 1801 Full Code [id number]  Gaynelle Arabian, MD Inpatient   05/08/2017 0002 05/10/2017 1512 Full Code [id number]  Harvie Bridge, DO Inpatient   09/08/2015 2107 09/09/2015 1804 Full Code [id number]  Demetrios Loll, MD Inpatient    Advance Directive Documentation     Most Recent Value  Type of Advance Directive  Healthcare Power of Attorney  Pre-existing out of facility DNR order (yellow form or pink MOST form)  -  "MOST" Form in Place?  -     Family Communication: Wife yesterday at the bedside Disposition Plan: To be determined  Consultants:   infectious disease  Antibiotics:  IV vancomycin  Time spent: 27 minutes  [place]

## 2018-04-24 NOTE — Progress Notes (Signed)
*  PRELIMINARY RESULTS* Echocardiogram 2D Echocardiogram has been performed.  Jerry Parsons 04/24/2018, 3:55 PM

## 2018-04-24 NOTE — Progress Notes (Signed)
PT Cancellation Note  Patient Details Name: Jerry Parsons MRN: [id number] DOB: [birth year]   Cancelled Treatment:    Reason Eval/Treat Not Completed: Patient at procedure or test/unavailable(Pt currently having Echo).  Will attempt to see pt next date, 4/30.     Collie Siad PT, DPT 04/24/2018, 3:32 PM

## 2018-04-25 DIAGNOSIS — J9601 Acute respiratory failure with hypoxia: Secondary | ICD-10-CM

## 2018-04-25 DIAGNOSIS — I4892 Unspecified atrial flutter: Secondary | ICD-10-CM

## 2018-04-25 LAB — CULTURE, BLOOD (ROUTINE X 2): Special Requests: ADEQUATE

## 2018-04-25 LAB — HCV COMMENT:

## 2018-04-25 LAB — GLUCOSE, CAPILLARY
Glucose-Capillary: 216 mg/dL — ABNORMAL HIGH (ref 65–99)
Glucose-Capillary: 212 mg/dL — ABNORMAL HIGH (ref 65–99)
Glucose-Capillary: 309 mg/dL — ABNORMAL HIGH (ref 65–99)
Glucose-Capillary: 269 mg/dL — ABNORMAL HIGH (ref 65–99)
Glucose-Capillary: 229 mg/dL — ABNORMAL HIGH (ref 65–99)

## 2018-04-25 LAB — BASIC METABOLIC PANEL
Anion gap: 5 (ref 5–15)
BUN: 13 mg/dL (ref 6–20)
CO2: 30 mmol/L (ref 22–32)
Calcium: 9 mg/dL (ref 8.9–10.3)
Chloride: 99 mmol/L — ABNORMAL LOW (ref 101–111)
Creatinine, Ser: 1 mg/dL (ref 0.61–1.24)
GFR calc Af Amer: >60 mL/min (ref >60)
GFR calc non Af Amer: >60 mL/min (ref >60)
Glucose, Bld: 222 mg/dL — ABNORMAL HIGH (ref 65–99)
Potassium: 3.7 mmol/L (ref 3.5–5.1)
Sodium: 134 mmol/L — ABNORMAL LOW (ref 135–145)

## 2018-04-25 LAB — HEPATITIS C ANTIBODY (REFLEX): HCV Ab: <0.1 s/co ratio (ref 0.0–0.9)

## 2018-04-25 LAB — CBC
HCT: 30.9 % — ABNORMAL LOW (ref 40.0–52.0)
Hemoglobin: 10.2 g/dL — ABNORMAL LOW (ref 13.0–18.0)
MCH: 25 pg — ABNORMAL LOW (ref 26.0–34.0)
MCHC: 33 g/dL (ref 32.0–36.0)
MCV: 75.7 fL — ABNORMAL LOW (ref 80.0–100.0)
Platelets: 108 10*3/uL — ABNORMAL LOW (ref 150–440)
RBC: 4.08 MIL/uL — ABNORMAL LOW (ref 4.40–5.90)
RDW: 18.3 % — ABNORMAL HIGH (ref 11.5–14.5)
WBC: 5.9 10*3/uL (ref 3.8–10.6)

## 2018-04-25 LAB — HEPATITIS B SURFACE ANTIGEN: Hepatitis B Surface Ag: NEGATIVE

## 2018-04-25 LAB — HIV ANTIBODY (ROUTINE TESTING W REFLEX): HIV Screen 4th Generation wRfx: NONREACTIVE

## 2018-04-25 NOTE — Progress Notes (Signed)
[name] [name]      DISCUSSION: 67 year old male with a history of dysphagia presenting with recurrent pneumonia most likely aspiration with superimposed bacterial infection  ASSESSMENT MRSA bacteremia.  Continue hypoxic respiratory failure, high oxygen requirement.  Obstructive sleep apnea, GERD dysphagia.  PLAN --Continue abx per ID.  - Wean down oxygen as tolerated, currently on 4L [place].  -Continue nightly BiPAP 14/9. - Patient was given my card, will need outpatient follow-up.   Date: 04/25/2018  MRN# [id number] Jerry Parsons 11-24-1951   Jerry Parsons is a 67 y.o. old male seen in follow up for chief complaint of  Chief Complaint  Patient presents with  . Fever     HPI:   Patient is a 67 year old male, former smoker Quit several years ago.  He also has a history of working in [place], no previous history of respiratory disease, denies any previous history of asthma or COPD.  Patient was admitted to the hospital with pneumonia, wife at bedside says he has had several bouts of pneumonia over the last few years.  Pt currently on [place] at 4L.   The patient has no history of sleep apnea, status post sleep study on 09/13/2015, patient required BiPAP 14/9.  Blood cultures on 4/27 showed gram-positive cocci in clusters, ID panel positive for staph aureus, staph species, methicillin resistance. ABX:  Vancomycin. 4/28>>    Imaging personally reviewed, chest x-ray the 04/22/2018, shows reduced lung volumes, cardiomegaly.  Bibasilar atelectasis.  Previous two-view chest x-ray on 07/13/2017, showed normal heart with slight right heart enlargement.  Medication:    Current Facility-Administered Medications:  .  acetaminophen (TYLENOL) tablet 650 mg, 650 mg, Oral, Q6H PRN, Arta Silence, MD, 650 mg at 04/24/18 1249 .  amiodarone (PACERONE) tablet 200 mg, 200 mg, Oral, Daily, Jodell Cipro, Prasanna, MD, 200 mg at 04/25/18 0931 .  Ampicillin-Sulbactam (UNASYN) 3  g in sodium chloride 0.9 % 100 mL IVPB, 3 g, Intravenous, Q6H, Loletha Grayer, MD, Stopped at 04/25/18 1027 .  aspirin EC tablet 81 mg, 81 mg, Oral, Daily, Jodell Cipro, Prasanna, MD, 81 mg at 04/25/18 0929 .  atorvastatin (LIPITOR) tablet 40 mg, 40 mg, Oral, q1800, Jodell Cipro, Prasanna, MD, 40 mg at 04/24/18 1650 .  bisacodyl (DULCOLAX) EC tablet 5 mg, 5 mg, Oral, Daily PRN, Jodell Cipro, Prasanna, MD .  chlorhexidine (PERIDEX) 0.12 % solution 15 mL, 15 mL, Mouth Rinse, BID, Tukov-Yual, Magdalene S, NP, 15 mL at 04/25/18 0929 .  cyclobenzaprine (FLEXERIL) tablet 10 mg, 10 mg, Oral, BID, Lafayette Dragon, MD, 10 mg at 04/25/18 0008 .  dutasteride (AVODART) capsule 0.5 mg, 0.5 mg, Oral, Daily, Jodell Cipro, Prasanna, MD, 0.5 mg at 04/25/18 0929 .  enoxaparin (LOVENOX) injection 40 mg, 40 mg, Subcutaneous, Q24H, Wieting, Richard, MD, 40 mg at 04/25/18 0932 .  famotidine (PEPCID) tablet 20 mg, 20 mg, Oral, Daily, Sridharan, Prasanna, MD, 20 mg at 04/25/18 0929 .  furosemide (LASIX) tablet 40 mg, 40 mg, Oral, Daily, Sridharan, Prasanna, MD, 40 mg at 04/25/18 0929 .  hydrochlorothiazide (HYDRODIURIL) tablet 25 mg, 25 mg, Oral, QHS, Sridharan, Prasanna, MD, 25 mg at 04/25/18 0008 .  insulin aspart (novoLOG) injection 0-20 Units, 0-20 Units, Subcutaneous, TID WC, Loletha Grayer, MD, 7 Units at 04/25/18 [phone] .  insulin aspart (novoLOG) injection 0-5 Units, 0-5 Units, Subcutaneous, QHS, Wieting, Richard, MD .  insulin aspart (novoLOG) injection 14 Units, 14 Units, Subcutaneous, TID WC, Arta Silence, MD, 14 Units at 04/24/18 1241 .  insulin glargine (LANTUS) injection 30  Units, 30 Units, Subcutaneous, BID, Arta Silence, MD, 30 Units at 04/25/18 0931 .  ipratropium-albuterol (DUONEB) 0.5-2.5 (3) MG/3ML nebulizer solution 3 mL, 3 mL, Nebulization, Q6H, Tukov-Yual, Magdalene S, NP, 3 mL at 04/25/18 0810 .  MEDLINE mouth rinse, 15 mL, Mouth Rinse, BID, Tukov-Yual, Magdalene S, NP, 15 mL at 04/25/18  0932 .  metoprolol tartrate (LOPRESSOR) tablet 100 mg, 100 mg, Oral, BID, Jodell Cipro, Prasanna, MD, 100 mg at 04/25/18 0929 .  multivitamin with minerals tablet 1 tablet, 1 tablet, Oral, Daily, Arta Silence, MD, 1 tablet at 04/25/18 0929 .  ondansetron (ZOFRAN) injection 4 mg, 4 mg, Intravenous, Q6H PRN, Jodell Cipro, Prasanna, MD .  oxyCODONE (Oxy IR/ROXICODONE) immediate release tablet 5 mg, 5 mg, Oral, Q4H PRN **OR** oxyCODONE (Oxy IR/ROXICODONE) immediate release tablet 10 mg, 10 mg, Oral, Q4H PRN, Jodell Cipro, Prasanna, MD .  oxyCODONE (OXYCONTIN) 12 hr tablet 40 mg, 40 mg, Oral, Q8H, Sridharan, Prasanna, MD, 40 mg at 04/25/18 0539 .  pantoprazole (PROTONIX) EC tablet 40 mg, 40 mg, Oral, Daily, Jodell Cipro, Prasanna, MD, 40 mg at 04/25/18 0929 .  pregabalin (LYRICA) capsule 200 mg, 200 mg, Oral, BID, Lafayette Dragon, MD, 200 mg at 04/25/18 3875 .  pregabalin (LYRICA) capsule 300 mg, 300 mg, Oral, QHS, Sridharan, Prasanna, MD, 300 mg at 04/25/18 0009 .  senna-docusate (Senokot-S) tablet 2 tablet, 2 tablet, Oral, BID PRN, Jodell Cipro, Prasanna, MD .  sucralfate (CARAFATE) tablet 1 g, 1 g, Oral, TID WC & HS, Jodell Cipro, Prasanna, MD, 1 g at 04/25/18 0807 .  vancomycin (VANCOCIN) 1,500 mg in sodium chloride 0.9 % 500 mL IVPB, 1,500 mg, Intravenous, Q12H, Rocky Morel, RPH, Stopped at 04/25/18 6433   Allergies:  Carisoprodol  Review of Systems: Gen:  Denies  fever, sweats. HEENT: Denies blurred vision. Cvc:  No dizziness, chest pain or heaviness Other:  All other systems were reviewed and found to be negative other than what is mentioned in the HPI.   Physical Examination:   VS: BP 115/79 (BP Location: Right Arm)   Pulse 99   Temp 98.8 F (37.1 C) (Oral)   Resp 19   Ht 5\' 11"  (1.803 m)   Wt 276 lb 7.3 oz (125.4 kg)   SpO2 93%   BMI 38.56 kg/m    General Appearance: No distress  Neuro:without focal findings,  speech normal,  HEENT: PERRLA, EOM intact. Pulmonary: normal breath  sounds, No wheezing.   CardiovascularNormal S1,S2.  No m/r/g.   Abdomen: Benign, Soft, non-tender. Renal:  No costovertebral tenderness  GU:  Not performed at this time. Endoc: No evident thyromegaly, no signs of acromegaly. Skin:   warm, no rash. Extremities: normal, no cyanosis, clubbing.   LABORATORY PANEL:   CBC Recent Labs  Lab 04/25/18 0525  WBC 5.9  HGB 10.2*  HCT 30.9*  PLT 108*   ------------------------------------------------------------------------------------------------------------------  Chemistries  Recent Labs  Lab 04/22/18 0254  04/24/18 0404 04/25/18 0525  NA 136   < > 136 134*  K 3.6   < > 3.8 3.7  CL 98*   < > 100* 99*  CO2 29   < > 30 30  GLUCOSE 359*   < > 191* 222*  BUN 26*   < > 15 13  CREATININE 1.42*   < > 1.12 1.00  CALCIUM 9.5   < > 9.1 9.0  MG  --    < > 1.8  --   AST 45*  --   --   --   ALT  35  --   --   --   ALKPHOS 110  --   --   --   BILITOT 0.6  --   --   --    < > = values in this interval not displayed.   ------------------------------------------------------------------------------------------------------------------  Cardiac Enzymes Recent Labs  Lab 04/22/18 0641  TROPONINI 0.04*   ------------------------------------------------------------  RADIOLOGY:   No results found for this or any previous visit. Results for orders placed during the hospital encounter of 07/13/17  DG Chest 2 View   Narrative CLINICAL DATA:  Cough and congestion  EXAM: CHEST  2 VIEW  COMPARISON:  05/07/2017  FINDINGS: Cardiac shadow is within normal limits. The lungs are clear bilaterally. No focal infiltrate or sizable effusion is seen. Degenerative changes of the thoracic spine are noted. Postsurgical changes in the cervical and lumbar spine are noted as well.  IMPRESSION: No active cardiopulmonary disease.   Electronically Signed   By: Inez Catalina M.D.   On: 07/13/2017 15:01     ------------------------------------------------------------------------------------------------------------------  Thank  you for allowing [facility] to assist in the care of your patient. Our recommendations are noted above.  Please contact us if we can be of further service.   Marda Stalker, MD.  [facility] Office Number: [phone]  Patricia Pesa, M.D.  Merton Border, M.D  04/25/2018

## 2018-04-25 NOTE — Evaluation (Signed)
Physical Therapy Evaluation Patient Details Name: Jerry Parsons MRN: [id number] DOB: [birth year] Today's Date: 04/25/2018   History of Present Illness  67 yo male with onset of  fever and confusion at home, admitted with hypoxic respiratory failure and susp aspiration PNA.  He is on 3L O2, was off CPAP prior to his admission.  PMHx:  a-fib, R eye blind, DM, DVT, PE, HTN, seizures, TIA, PN,  Clinical Impression  Pt was able to walk with PT but O2 sats were compromised with effort and was not able to take a longer walk.  Per pt was out washing car and very active prior to his hospitalization, but now having trouble with sats with light effort.  Will follow acutely for conditioning and training to his cardiovascular system, and will progress as pt can tolerate with his limits of O2 sats on 3L O2.  Follow for acute changes in HR, O2 sats and mobility to see if pt may be able to go directly home, but if not recommend CIR.    Follow Up Recommendations CIR    Equipment Recommendations  None recommended by PT    Recommendations for Other Services       Precautions / Restrictions Precautions Precautions: Fall(CONTACT) Precaution Comments: MRSA precautions Restrictions Weight Bearing Restrictions: No      Mobility  Bed Mobility Overal bed mobility: Modified Independent                Transfers Overall transfer level: Modified independent Equipment used: 1 person hand held assist(IV pole)                Ambulation/Gait Ambulation/Gait assistance: Min guard Ambulation Distance (Feet): 40 Feet Assistive device: 1 person hand held assist(IV pole) Gait Pattern/deviations: Decreased stride length;Wide base of support Gait velocity: reduced Gait velocity interpretation: <1.31 ft/sec, indicative of household ambulator    Financial trader Rankin (Stroke Patients Only)       Balance Overall balance assessment: Needs  assistance Sitting-balance support: Feet supported Sitting balance-Leahy Scale: Good     Standing balance support: Bilateral upper extremity supported;During functional activity Standing balance-Leahy Scale: Fair Standing balance comment: less than fair dynamic balance                             Pertinent Vitals/Pain Pain Assessment: No/denies pain    Home Living Family/patient expects to be discharged to:: Inpatient rehab Living Arrangements: Spouse/significant other Available Help at Discharge: Family;Available 24 hours/day Type of Home: House Home Access: Stairs to enter Entrance Stairs-Rails: None Entrance Stairs-Number of Steps: 1 Home Layout: One level Home Equipment: Bedside commode;Shower seat      Prior Function Level of Independence: Independent               Hand Dominance   Dominant Hand: Right    Extremity/Trunk Assessment   Upper Extremity Assessment Upper Extremity Assessment: Overall WFL for tasks assessed    Lower Extremity Assessment Lower Extremity Assessment: Overall WFL for tasks assessed    Cervical / Trunk Assessment Cervical / Trunk Assessment: Normal  Communication   Communication: No difficulties  Cognition Arousal/Alertness: Awake/alert Behavior During Therapy: WFL for tasks assessed/performed Overall Cognitive Status: Within Functional Limits for tasks assessed  General Comments General comments (skin integrity, edema, etc.): Pt used 3L O2 for gait with lengthened line to allow trip to door but prewalk O2 sat was 91% and post walk was 85%.      Exercises     Assessment/Plan    PT Assessment Patient needs continued PT services  PT Problem List Decreased strength;Decreased range of motion;Decreased activity tolerance;Decreased balance;Decreased coordination;Decreased mobility;Decreased knowledge of use of DME;Cardiopulmonary status limiting  activity;Obesity;Decreased safety awareness       PT Treatment Interventions DME instruction;Gait training;Stair training;Functional mobility training;Therapeutic activities;Therapeutic exercise;Balance training;Neuromuscular re-education;Patient/family education    PT Goals (Current goals can be found in the Care Plan section)  Acute Rehab PT Goals Patient Stated Goal: to get stronger and get home PT Goal Formulation: With patient Time For Goal Achievement: 05/09/18 Potential to Achieve Goals: Good    Frequency Min 2X/week   Barriers to discharge Inaccessible home environment one step no railing    Co-evaluation               AM-PAC PT "6 Clicks" Daily Activity  Outcome Measure Difficulty turning over in bed (including adjusting bedclothes, sheets and blankets)?: None Difficulty moving from lying on back to sitting on the side of the bed? : A Little Difficulty sitting down on and standing up from a chair with arms (e.g., wheelchair, bedside commode, etc,.)?: A Little Help needed moving to and from a bed to chair (including a wheelchair)?: A Little Help needed walking in hospital room?: A Little Help needed climbing 3-5 steps with a railing? : A Little 6 Click Score: 19    End of Session Equipment Utilized During Treatment: Gait belt;Oxygen Activity Tolerance: Treatment limited secondary to medical complications (Comment) Patient left: in bed;with call bell/phone within reach(sitting on side of bed) Nurse Communication: Mobility status PT Visit Diagnosis: Difficulty in walking, not elsewhere classified (R26.2);Other (comment);Unsteadiness on feet (R26.81)(medical symptoms of low O2 sats)    Time: 6568-1275 PT Time Calculation (min) (ACUTE ONLY): 33 min   Charges:   PT Evaluation $PT Eval Moderate Complexity: 1 Mod PT Treatments $Gait Training: 8-22 mins   PT G Codes:   PT G-Codes **NOT FOR INPATIENT CLASS** Functional Assessment Tool Used: AM-PAC 6 Clicks Basic  Mobility    Jerry Parsons 04/25/2018, 3:45 PM   Mee Hives, PT MS Acute Rehab Dept. Number: [place] and [place]

## 2018-04-25 NOTE — Progress Notes (Signed)
Patient ID: Jerry Parsons, male   DOB: [birth year], 67 y.o.   MRN: [id number]  [employer] [facility] PROGRESS NOTE  Jerry Parsons [id number] DOB: [birth year] DOA: 04/22/2018 PCP: Jerrol Banana., MD  HPI/Subjective: Patient feeling okay.  A little bit of shortness of breath.  Temperature curve trending better  Objective: Vitals:   04/25/18 0927 04/25/18 1110  BP: 111/82 115/79  Pulse: (!) 103 99  Resp: 17 19  Temp: 99.1 F (37.3 C) 98.8 F (37.1 C)  SpO2: 91% 93%    Filed Weights   04/22/18 0535  Weight: 125.4 kg (276 lb 7.3 oz)    ROS: Review of Systems  Constitutional: Positive for fever. Negative for chills.  Eyes: Negative for blurred vision.  Respiratory: Positive for shortness of breath. Negative for cough.   Cardiovascular: Negative for chest pain.  Gastrointestinal: Negative for abdominal pain, constipation, diarrhea, nausea and vomiting.  Genitourinary: Negative for dysuria.  Musculoskeletal: Negative for joint pain.  Neurological: Negative for dizziness and headaches.   Exam: Physical Exam  Constitutional: He is oriented to person, place, and time.  HENT:  Nose: No mucosal edema.  Mouth/Throat: No oropharyngeal exudate or posterior oropharyngeal edema.  Eyes: Pupils are equal, round, and reactive to light. Conjunctivae, EOM and lids are normal.  Neck: No JVD present. Carotid bruit is not present. No edema present. No thyroid mass and no thyromegaly present.  Cardiovascular: S1 normal and S2 normal. Exam reveals no gallop.  No murmur heard. Pulses:      Dorsalis pedis pulses are 2+ on the right side, and 2+ on the left side.  Respiratory: No respiratory distress. He has decreased breath sounds in the right lower field. He has no wheezes. He has rhonchi in the right lower field. He has no rales.  GI: Soft. Bowel sounds are normal. There is no tenderness.  Musculoskeletal:       Right ankle: He exhibits swelling.       Left ankle: He exhibits swelling.   Lymphadenopathy:    He has no cervical adenopathy.  Neurological: He is alert and oriented to person, place, and time. No cranial nerve deficit.  Skin: Skin is warm. No rash noted. Nails show no clubbing.  Psychiatric: He has a normal mood and affect.      Data Reviewed: Basic Metabolic Panel: Recent Labs  Lab 04/22/18 0254 04/22/18 0641 04/23/18 0721 04/24/18 0404 04/25/18 0525  NA 136  --  138 136 134*  K 3.6  --  3.5 3.8 3.7  CL 98*  --  105 100* 99*  CO2 29  --  29 30 30   GLUCOSE 359*  --  144* 191* 222*  BUN 26*  --  16 15 13   CREATININE 1.42*  --  0.99 1.12 1.00  CALCIUM 9.5  --  8.5* 9.1 9.0  MG  --  1.8  --  1.8  --   PHOS  --  2.6  --   --   --    Liver Function Tests: Recent Labs  Lab 04/22/18 0254  AST 45*  ALT 35  ALKPHOS 110  BILITOT 0.6  PROT 7.3  ALBUMIN 3.9   CBC: Recent Labs  Lab 04/22/18 0254 04/23/18 0721 04/24/18 0404 04/25/18 0525  WBC 13.5* 8.1  --  5.9  NEUTROABS 11.8*  --   --   --   HGB 12.9* 10.1*  --  10.2*  HCT 39.7* 31.5*  --  30.9*  MCV 75.6* 77.3*  --  75.7*  PLT 133* 86* 110* 108*   Cardiac Enzymes: Recent Labs  Lab 04/22/18 0254 04/22/18 0641  TROPONINI <0.03 0.04*   BNP (last 3 results) Recent Labs    04/22/18 0254  BNP 49.0     CBG: Recent Labs  Lab 04/24/18 2058 04/24/18 2358 04/25/18 0431 04/25/18 0718 04/25/18 1134  GLUCAP 222* 190* 216* 212* 309*    Recent Results (from the past 240 hour(s))  Blood Culture (routine x 2)     Status: None (Preliminary result)   Collection Time: 04/22/18  2:54 AM  Result Value Ref Range Status   Specimen Description BLOOD BLOOD RIGHT HAND  Final   Special Requests   Final    BOTTLES DRAWN AEROBIC AND ANAEROBIC Blood Culture adequate volume   Culture   Final    NO GROWTH 3 DAYS Performed at [facility], [address], [place], [place] [address]    Report Status PENDING  Incomplete  Blood Culture (routine x 2)     Status: Abnormal   Collection  Time: 04/22/18  2:54 AM  Result Value Ref Range Status   Specimen Description   Final    BLOOD RIGHT ANTECUBITAL Performed at [facility], [address] [place], [place] [address]    Special Requests   Final    BOTTLES DRAWN AEROBIC AND ANAEROBIC Blood Culture adequate volume Performed at [facility], [address] [place], [place] [address]    Culture  Setup Time   Final    ANAEROBIC BOTTLE ONLY GRAM POSITIVE COCCI IN CLUSTERS CRITICAL RESULT CALLED TO, READ BACK BY AND VERIFIED WITH: Rives 04/22/18.PMH Performed at [facility], [place] [address] [place], [place] [address]    Culture METHICILLIN RESISTANT STAPHYLOCOCCUS AUREUS (A)  Final   Report Status 04/25/2018 FINAL  Final   Organism ID, Bacteria METHICILLIN RESISTANT STAPHYLOCOCCUS AUREUS  Final      Susceptibility   Methicillin resistant staphylococcus aureus - MIC*    CIPROFLOXACIN >=8 RESISTANT Resistant     ERYTHROMYCIN >=8 RESISTANT Resistant     GENTAMICIN <=0.5 SENSITIVE Sensitive     OXACILLIN RESISTANT Resistant     TETRACYCLINE <=1 SENSITIVE Sensitive     VANCOMYCIN <=0.5 SENSITIVE Sensitive     TRIMETH/SULFA <=10 SENSITIVE Sensitive     CLINDAMYCIN <=0.25 SENSITIVE Sensitive     RIFAMPIN <=0.5 SENSITIVE Sensitive     Inducible Clindamycin NEGATIVE Sensitive     * METHICILLIN RESISTANT STAPHYLOCOCCUS AUREUS  Blood Culture ID Panel (Reflexed)     Status: Abnormal   Collection Time: 04/22/18  2:54 AM  Result Value Ref Range Status   Enterococcus species NOT DETECTED NOT DETECTED Final   Listeria monocytogenes NOT DETECTED NOT DETECTED Final   Staphylococcus species DETECTED (A) NOT DETECTED Final    Comment: CRITICAL RESULT CALLED TO, READ BACK BY AND VERIFIED WITH: GARRETT COFFEE AT 1956 04/22/18.PMH    Staphylococcus aureus DETECTED (A) NOT DETECTED Final    Comment: Methicillin (oxacillin)-resistant Staphylococcus aureus (MRSA). MRSA is predictably resistant to  beta-lactam antibiotics (except ceftaroline). Preferred therapy is vancomycin unless clinically contraindicated. Patient requires contact precautions if  hospitalized. CRITICAL RESULT CALLED TO, READ BACK BY AND VERIFIED WITH: GARRETT COFFEE AT 1956 04/22/18.PMH    Methicillin resistance DETECTED (A) NOT DETECTED Final    Comment: CRITICAL RESULT CALLED TO, READ BACK BY AND VERIFIED WITH: GARRETT COFFEE AT 1956 04/22/18.PMH    Streptococcus species NOT DETECTED NOT DETECTED Final  Streptococcus agalactiae NOT DETECTED NOT DETECTED Final   Streptococcus pneumoniae NOT DETECTED NOT DETECTED Final   Streptococcus pyogenes NOT DETECTED NOT DETECTED Final   Acinetobacter baumannii NOT DETECTED NOT DETECTED Final   Enterobacteriaceae species NOT DETECTED NOT DETECTED Final   Enterobacter cloacae complex NOT DETECTED NOT DETECTED Final   Escherichia coli NOT DETECTED NOT DETECTED Final   Klebsiella oxytoca NOT DETECTED NOT DETECTED Final   Klebsiella pneumoniae NOT DETECTED NOT DETECTED Final   Proteus species NOT DETECTED NOT DETECTED Final   Serratia marcescens NOT DETECTED NOT DETECTED Final   Haemophilus influenzae NOT DETECTED NOT DETECTED Final   Neisseria meningitidis NOT DETECTED NOT DETECTED Final   Pseudomonas aeruginosa NOT DETECTED NOT DETECTED Final   Candida albicans NOT DETECTED NOT DETECTED Final   Candida glabrata NOT DETECTED NOT DETECTED Final   Candida krusei NOT DETECTED NOT DETECTED Final   Candida parapsilosis NOT DETECTED NOT DETECTED Final   Candida tropicalis NOT DETECTED NOT DETECTED Final    Comment: Performed at [facility], [address], [place], [place] [address]  Urine culture     Status: None   Collection Time: 04/22/18  2:55 AM  Result Value Ref Range Status   Specimen Description   Final    URINE, RANDOM Performed at [facility], [address], [place], [place] [address]    Special Requests   Final    NONE Performed at  [facility], [address] [place], [place] [address]    Culture   Final    NO GROWTH Performed at [facility], [place] [address], [place], [place] [address]    Report Status 04/23/2018 FINAL  Final  MRSA PCR Screening     Status: None   Collection Time: 04/22/18  5:53 AM  Result Value Ref Range Status   MRSA by PCR NEGATIVE NEGATIVE Final    Comment:        The GeneXpert MRSA Assay (FDA approved for NASAL specimens only), is one component of a comprehensive MRSA colonization surveillance program. It is not intended to diagnose MRSA infection nor to guide or monitor treatment for MRSA infections. Performed at [facility], [address] [place], [place] [address]   Culture, blood (Routine X 2) w Reflex to ID Panel     Status: None (Preliminary result)   Collection Time: 04/22/18 11:33 PM  Result Value Ref Range Status   Specimen Description BLOOD LEFT ANTECUBITAL  Final   Special Requests   Final    BOTTLES DRAWN AEROBIC AND ANAEROBIC Blood Culture adequate volume   Culture   Final    NO GROWTH 3 DAYS Performed at [facility], [address] [place], [place] [address]    Report Status PENDING  Incomplete  Culture, blood (Routine X 2) w Reflex to ID Panel     Status: None (Preliminary result)   Collection Time: 04/22/18 11:43 PM  Result Value Ref Range Status   Specimen Description BLOOD LEFT HAND  Final   Special Requests   Final    BOTTLES DRAWN AEROBIC AND ANAEROBIC Blood Culture results may not be optimal due to an excessive volume of blood received in culture bottles   Culture   Final    NO GROWTH 3 DAYS Performed at [facility], [place] [place], [place] [address]    Report Status PENDING  Incomplete  CULTURE, BLOOD (ROUTINE X 2) w Reflex to ID Panel  Status: None (Preliminary result)   Collection Time: 04/24/18  4:04 AM  Result Value Ref Range Status   Specimen Description BLOOD RIGHT ANTECUBITAL   Final   Special Requests   Final    BOTTLES DRAWN AEROBIC AND ANAEROBIC Blood Culture adequate volume   Culture   Final    NO GROWTH 1 DAY Performed at [facility], [address] [place], [place] [address]    Report Status PENDING  Incomplete  CULTURE, BLOOD (ROUTINE X 2) w Reflex to ID Panel     Status: None (Preliminary result)   Collection Time: 04/24/18  4:06 AM  Result Value Ref Range Status   Specimen Description BLOOD LEFT HAND  Final   Special Requests   Final    BOTTLES DRAWN AEROBIC AND ANAEROBIC Blood Culture adequate volume   Culture   Final    NO GROWTH 1 DAY Performed at [facility], [address], [place], [place] [address]    Report Status PENDING  Incomplete     Scheduled Meds: . amiodarone  200 mg Oral Daily  . aspirin EC  81 mg Oral Daily  . atorvastatin  40 mg Oral q1800  . chlorhexidine  15 mL Mouth Rinse BID  . cyclobenzaprine  10 mg Oral BID  . dutasteride  0.5 mg Oral Daily  . enoxaparin (LOVENOX) injection  40 mg Subcutaneous Q24H  . famotidine  20 mg Oral Daily  . furosemide  40 mg Oral Daily  . hydrochlorothiazide  25 mg Oral QHS  . insulin aspart  0-20 Units Subcutaneous TID WC  . insulin aspart  0-5 Units Subcutaneous QHS  . insulin aspart  14 Units Subcutaneous TID WC  . insulin glargine  30 Units Subcutaneous BID  . ipratropium-albuterol  3 mL Nebulization Q6H  . mouth rinse  15 mL Mouth Rinse BID  . metoprolol tartrate  100 mg Oral BID  . multivitamin with minerals  1 tablet Oral Daily  . oxyCODONE  40 mg Oral Q8H  . pantoprazole  40 mg Oral Daily  . pregabalin  200 mg Oral BID  . pregabalin  300 mg Oral QHS  . sucralfate  1 g Oral TID WC & HS   Continuous Infusions: . ampicillin-sulbactam (UNASYN) IV Stopped (04/25/18 1027)  . vancomycin 1,500 mg (04/25/18 1308)    Assessment/Plan:  1. Clinical sepsis present on admission secondary to pneumonia.  Lactic acidosis.  One blood culture growing MRSA.  Likely  will need 4 weeks of IV vancomycin.  Since blood cultures are negative PICC line ordered.  Cardiology consultation for TEE.  Also on Unasyn for possible aspiration 2. Acute hypoxic respiratory failure.  Patient on 3 L of oxygen.  Try to taper.  Check pulse ox room air. 3. History of sleep apnea on CPAP at night 4. History of esophageal stricture status post dilation last endoscopy 5. Type 2 diabetes mellitus on glargine insulin and sliding scale 6. Obesity with a BMI of 38.56 7. Essential hypertension on hydrochlorothiazide, metoprolol 8. Restless leg syndrome on Lyrica 9. Hyperlipidemia unspecified on atorvastatin 10. History of arrhythmia on amiodarone and metoprolol 11. Chronic pain on high-dose pain medications.  Code Status:     Code Status Orders  (From admission, onward)        Start     Ordered   04/22/18 0536  Full code  Continuous     04/22/18 0535    Code Status History    Date Active Date Inactive Code  Status Order ID Comments User Context   06/20/2017 1035 06/22/2017 1801 Full Code [id number]  Gaynelle Arabian, MD Inpatient   05/08/2017 0002 05/10/2017 1512 Full Code [id number]  Harvie Bridge, [facility] Inpatient   09/08/2015 2107 09/09/2015 1804 Full Code [id number]  Demetrios Loll, MD Inpatient    Advance Directive Documentation     Most Recent Value  Type of Advance Directive  Healthcare Power of Attorney  Pre-existing out of facility DNR order (yellow form or pink MOST form)  -  "MOST" Form in Place?  -     Family Communication: Wife at bedside today Disposition Plan:  will need to be set up with home health for IV antibiotics  Consultants:   infectious disease  Antibiotics:  IV vancomycin  Unasyn  Time spent: 27 minutes  [place]

## 2018-04-25 NOTE — Progress Notes (Signed)
To 4l [place] after neb

## 2018-04-25 NOTE — Progress Notes (Signed)
[facility] INFECTIOUS DISEASE PROGRESS NOTE Date of Admission:  04/22/2018     ID: Jerry Parsons is a 67 y.o. male with MRSA bacteremia Active Problems:   Sepsis due to pneumonia [facility])  Subjective: Febrile yest but overall feels stronger and better.   ROS  Eleven systems are reviewed and negative except per hpi  Medications:  Antibiotics Given (last 72 hours)    Date/Time Action Medication Dose Rate   04/22/18 1706 New Bag/Given   cefTRIAXone (ROCEPHIN) 1 g in sodium chloride 0.9 % 100 mL IVPB 1 g 200 mL/hr   04/22/18 1757 New Bag/Given   doxycycline (VIBRAMYCIN) 100 mg in sodium chloride 0.9 % 250 mL IVPB 100 mg 125 mL/hr   04/22/18 2117 New Bag/Given   vancomycin (VANCOCIN) 2,000 mg in sodium chloride 0.9 % 500 mL IVPB 2,000 mg 250 mL/hr   04/23/18 0206 New Bag/Given   vancomycin (VANCOCIN) 1,250 mg in sodium chloride 0.9 % 250 mL IVPB 1,250 mg 166.7 mL/hr   04/23/18 1350 New Bag/Given   vancomycin (VANCOCIN) 1,250 mg in sodium chloride 0.9 % 250 mL IVPB 1,250 mg 166.7 mL/hr   04/24/18 0139 New Bag/Given   vancomycin (VANCOCIN) 1,250 mg in sodium chloride 0.9 % 250 mL IVPB 1,250 mg 166.7 mL/hr   04/24/18 1336 New Bag/Given   vancomycin (VANCOCIN) 1,250 mg in sodium chloride 0.9 % 250 mL IVPB 1,250 mg 166.7 mL/hr   04/24/18 1616 New Bag/Given   Ampicillin-Sulbactam (UNASYN) 3 g in sodium chloride 0.9 % 100 mL IVPB 3 g 200 mL/hr   04/25/18 0008 New Bag/Given   Ampicillin-Sulbactam (UNASYN) 3 g in sodium chloride 0.9 % 100 mL IVPB 3 g 200 mL/hr   04/25/18 0059 New Bag/Given   vancomycin (VANCOCIN) 1,500 mg in sodium chloride 0.9 % 500 mL IVPB 1,500 mg 250 mL/hr   04/25/18 0535 New Bag/Given   Ampicillin-Sulbactam (UNASYN) 3 g in sodium chloride 0.9 % 100 mL IVPB 3 g 200 mL/hr   04/25/18 0933 New Bag/Given   Ampicillin-Sulbactam (UNASYN) 3 g in sodium chloride 0.9 % 100 mL IVPB 3 g 200 mL/hr   04/25/18 1308 New Bag/Given   vancomycin (VANCOCIN) 1,500 mg in sodium chloride  0.9 % 500 mL IVPB 1,500 mg 250 mL/hr     . amiodarone  200 mg Oral Daily  . aspirin EC  81 mg Oral Daily  . atorvastatin  40 mg Oral q1800  . chlorhexidine  15 mL Mouth Rinse BID  . cyclobenzaprine  10 mg Oral BID  . dutasteride  0.5 mg Oral Daily  . enoxaparin (LOVENOX) injection  40 mg Subcutaneous Q24H  . famotidine  20 mg Oral Daily  . furosemide  40 mg Oral Daily  . hydrochlorothiazide  25 mg Oral QHS  . insulin aspart  0-20 Units Subcutaneous TID WC  . insulin aspart  0-5 Units Subcutaneous QHS  . insulin aspart  14 Units Subcutaneous TID WC  . insulin glargine  30 Units Subcutaneous BID  . ipratropium-albuterol  3 mL Nebulization Q6H  . mouth rinse  15 mL Mouth Rinse BID  . metoprolol tartrate  100 mg Oral BID  . multivitamin with minerals  1 tablet Oral Daily  . oxyCODONE  40 mg Oral Q8H  . pantoprazole  40 mg Oral Daily  . pregabalin  200 mg Oral BID  . pregabalin  300 mg Oral QHS  . sucralfate  1 g Oral TID WC & HS    Objective: Vital signs in  last 24 hours: Temp:  [98.8 F (37.1 C)-99.4 F (37.4 C)] 98.8 F (37.1 C) (04/30 1110) Pulse Rate:  [84-103] 99 (04/30 1110) Resp:  [17-20] 19 (04/30 1110) BP: (111-119)/(64-82) 115/79 (04/30 1110) SpO2:  [91 %-94 %] 93 % (04/30 1110) FiO2 (%):  [30 %] 30 % (04/30 0213) Constitutional: He is oriented to person, place, and time. Obese. On 4L o2 HENT: anicteric Mouth/Throat: Oropharynx is clear and moist. No oropharyngeal exudate.  Cardiovascular: Normal rate, regular rhythm 2/6 sm Pulmonary/Chest: bil rhonchi Abdominal: Soft. Bowel sounds are normal. He exhibits no distension. There is no tenderness.  Lymphadenopathy: He has no cervical adenopathy.  Neurological: He is alert and oriented to person, place, and time.  Skin: Skin is warm and dry. No rash noted. No erythema.  Psychiatric: He has a normal mood and affect. His behavior is normal.  Ext 1+ edema bil le   Lab Results Recent Labs    04/23/18 0721  04/24/18 0404 04/25/18 0525  WBC 8.1  --  5.9  HGB 10.1*  --  10.2*  HCT 31.5*  --  30.9*  NA 138 136 134*  K 3.5 3.8 3.7  CL 105 100* 99*  CO2 29 30 30   BUN 16 15 13   CREATININE 0.99 1.12 1.00    Microbiology: Results for orders placed or performed during the hospital encounter of 04/22/18  Blood Culture (routine x 2)     Status: None (Preliminary result)   Collection Time: 04/22/18  2:54 AM  Result Value Ref Range Status   Specimen Description BLOOD BLOOD RIGHT HAND  Final   Special Requests   Final    BOTTLES DRAWN AEROBIC AND ANAEROBIC Blood Culture adequate volume   Culture   Final    NO GROWTH 3 DAYS Performed at [facility], [address], [place], [place] [address]    Report Status PENDING  Incomplete  Blood Culture (routine x 2)     Status: Abnormal   Collection Time: 04/22/18  2:54 AM  Result Value Ref Range Status   Specimen Description   Final    BLOOD RIGHT ANTECUBITAL Performed at [facility], [address], [place], [place] [address]    Special Requests   Final    BOTTLES DRAWN AEROBIC AND ANAEROBIC Blood Culture adequate volume Performed at [facility], [address] [place], [place] [address]    Culture  Setup Time   Final    ANAEROBIC BOTTLE ONLY GRAM POSITIVE COCCI IN CLUSTERS CRITICAL RESULT CALLED TO, READ BACK BY AND VERIFIED WITH: GARRETT COFFEE AT 1956 04/22/18.PMH Performed at [facility], [place] [address], [place], [place] [address]    Culture METHICILLIN RESISTANT STAPHYLOCOCCUS AUREUS (A)  Final   Report Status 04/25/2018 FINAL  Final   Organism ID, Bacteria METHICILLIN RESISTANT STAPHYLOCOCCUS AUREUS  Final      Susceptibility   Methicillin resistant staphylococcus aureus - MIC*    CIPROFLOXACIN >=8 RESISTANT Resistant     ERYTHROMYCIN >=8 RESISTANT Resistant     GENTAMICIN <=0.5 SENSITIVE Sensitive     OXACILLIN RESISTANT Resistant     TETRACYCLINE <=1 SENSITIVE Sensitive     VANCOMYCIN <=0.5  SENSITIVE Sensitive     TRIMETH/SULFA <=10 SENSITIVE Sensitive     CLINDAMYCIN <=0.25 SENSITIVE Sensitive     RIFAMPIN <=0.5 SENSITIVE Sensitive     Inducible Clindamycin NEGATIVE Sensitive     * METHICILLIN RESISTANT STAPHYLOCOCCUS AUREUS  Blood Culture ID Panel (Reflexed)  Status: Abnormal   Collection Time: 04/22/18  2:54 AM  Result Value Ref Range Status   Enterococcus species NOT DETECTED NOT DETECTED Final   Listeria monocytogenes NOT DETECTED NOT DETECTED Final   Staphylococcus species DETECTED (A) NOT DETECTED Final    Comment: CRITICAL RESULT CALLED TO, READ BACK BY AND VERIFIED WITH: GARRETT COFFEE AT 1956 04/22/18.PMH    Staphylococcus aureus DETECTED (A) NOT DETECTED Final    Comment: Methicillin (oxacillin)-resistant Staphylococcus aureus (MRSA). MRSA is predictably resistant to beta-lactam antibiotics (except ceftaroline). Preferred therapy is vancomycin unless clinically contraindicated. Patient requires contact precautions if  hospitalized. CRITICAL RESULT CALLED TO, READ BACK BY AND VERIFIED WITH: GARRETT COFFEE AT 1956 04/22/18.PMH    Methicillin resistance DETECTED (A) NOT DETECTED Final    Comment: CRITICAL RESULT CALLED TO, READ BACK BY AND VERIFIED WITH: GARRETT COFFEE AT 1956 04/22/18.PMH    Streptococcus species NOT DETECTED NOT DETECTED Final   Streptococcus agalactiae NOT DETECTED NOT DETECTED Final   Streptococcus pneumoniae NOT DETECTED NOT DETECTED Final   Streptococcus pyogenes NOT DETECTED NOT DETECTED Final   Acinetobacter baumannii NOT DETECTED NOT DETECTED Final   Enterobacteriaceae species NOT DETECTED NOT DETECTED Final   Enterobacter cloacae complex NOT DETECTED NOT DETECTED Final   Escherichia coli NOT DETECTED NOT DETECTED Final   Klebsiella oxytoca NOT DETECTED NOT DETECTED Final   Klebsiella pneumoniae NOT DETECTED NOT DETECTED Final   Proteus species NOT DETECTED NOT DETECTED Final   Serratia marcescens NOT DETECTED NOT DETECTED Final    Haemophilus influenzae NOT DETECTED NOT DETECTED Final   Neisseria meningitidis NOT DETECTED NOT DETECTED Final   Pseudomonas aeruginosa NOT DETECTED NOT DETECTED Final   Candida albicans NOT DETECTED NOT DETECTED Final   Candida glabrata NOT DETECTED NOT DETECTED Final   Candida krusei NOT DETECTED NOT DETECTED Final   Candida parapsilosis NOT DETECTED NOT DETECTED Final   Candida tropicalis NOT DETECTED NOT DETECTED Final    Comment: Performed at [facility], [address], [place], [place] [address]  Urine culture     Status: None   Collection Time: 04/22/18  2:55 AM  Result Value Ref Range Status   Specimen Description   Final    URINE, RANDOM Performed at [facility], [address] [place], [place] [address]    Special Requests   Final    NONE Performed at [facility], [address], [place], [place] [address]    Culture   Final    NO GROWTH Performed at [facility], [place] [address], [place], [place] [address]    Report Status 04/23/2018 FINAL  Final  MRSA PCR Screening     Status: None   Collection Time: 04/22/18  5:53 AM  Result Value Ref Range Status   MRSA by PCR NEGATIVE NEGATIVE Final    Comment:        The GeneXpert MRSA Assay (FDA approved for NASAL specimens only), is one component of a comprehensive MRSA colonization surveillance program. It is not intended to diagnose MRSA infection nor to guide or monitor treatment for MRSA infections. Performed at [facility], [address] [place], [place] [address]   Culture, blood (Routine X 2) w Reflex to ID Panel     Status: None (Preliminary result)   Collection Time: 04/22/18 11:33 PM  Result Value Ref Range Status   Specimen Description BLOOD LEFT ANTECUBITAL  Final   Special Requests   Final    BOTTLES DRAWN AEROBIC AND  ANAEROBIC Blood Culture adequate volume   Culture   Final    NO GROWTH 3 DAYS Performed at [facility], [address] [place], [place] [address]    Report Status PENDING  Incomplete  Culture, blood (Routine X 2) w Reflex to ID Panel     Status: None (Preliminary result)   Collection Time: 04/22/18 11:43 PM  Result Value Ref Range Status   Specimen Description BLOOD LEFT HAND  Final   Special Requests   Final    BOTTLES DRAWN AEROBIC AND ANAEROBIC Blood Culture results may not be optimal due to an excessive volume of blood received in culture bottles   Culture   Final    NO GROWTH 3 DAYS Performed at [facility], [address] [place], [place] [address]    Report Status PENDING  Incomplete  CULTURE, BLOOD (ROUTINE X 2) w Reflex to ID Panel     Status: None (Preliminary result)   Collection Time: 04/24/18  4:04 AM  Result Value Ref Range Status   Specimen Description BLOOD RIGHT ANTECUBITAL  Final   Special Requests   Final    BOTTLES DRAWN AEROBIC AND ANAEROBIC Blood Culture adequate volume   Culture   Final    NO GROWTH 1 DAY Performed at [facility], [address], [place],  [address]    Report Status PENDING  Incomplete  CULTURE, BLOOD (ROUTINE X 2) w Reflex to ID Panel     Status: None (Preliminary result)   Collection Time: 04/24/18  4:06 AM  Result Value Ref Range Status   Specimen Description BLOOD LEFT HAND  Final   Special Requests   Final    BOTTLES DRAWN AEROBIC AND ANAEROBIC Blood Culture adequate volume   Culture   Final    NO GROWTH 1 DAY Performed at [facility], [address] [place],  [address]    Report Status PENDING  Incomplete    Studies/Results: No results found.  Assessment/Plan: OTILIO GROLEAU is a 67 y.o. 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   He is also having some L sided post chest wall pain and some slightl  orsening of his chronic lbp.   CXR with possible R base infiltrate but unclear.  FU bcx done 4/29. TTE was poor study but no evidence of vegetation.  I do not think it likely he has aspiration PNA at this time but may have become bacteremic from his recent finger infection. 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  Will need full wu for endocarditis, PICC placement when stable and min 4 weeks IV abx. 4/30 - had fever yest, fu bcx pending. Seen by cards.  Recommendations Continue vancomycin  Repeat bcx 4/29 is NGTD - When negative x 48 hours can place PICC line -likely 4/31 When he improves from respiratory viewpoint would perform TEE - has been seen by cards/ Currently on 3 L O2- not on O2 at home. Discussed with patient and wife need for several weeks IV abx given severity of MRSA bacteremia and risks associated with it.  Thank you very much for the consult. Will follow with you.  Leonel Ramsay   04/25/2018, 2:40 PM

## 2018-04-25 NOTE — Progress Notes (Signed)
Inpatient Diabetes Program Recommendations  AACE/ADA: New Consensus Statement on Inpatient Glycemic Control (2015)  Target Ranges:  Prepandial:   less than 140 mg/dL      Peak postprandial:   less than 180 mg/dL (1-2 hours)      Critically ill patients:  140 - 180 mg/dL   Lab Results  Component Value Date   GLUCAP 309 (H) 04/25/2018   HGBA1C 6.5 09/14/2017    Review of Glycemic ControlResults for COEN, MIYASATO (MRN [id number]) as of 04/25/2018 14:42  Ref. Range 04/24/2018 20:58 04/24/2018 23:58 04/25/2018 04:31 04/25/2018 07:18 04/25/2018 11:34  Glucose-Capillary Latest Ref Range: 65 - 99 mg/dL 222 (H) 190 (H) 216 (H) 212 (H) 309 (H)   Diabetes history: Type 2 DM  Outpatient Diabetes medications: Novolog 14 units tid with meals, Januvia 100 mg, Victoza 1.8 mg daily, Metformin 1000 mg bid, Lantus 30 units bid Current orders for Inpatient glycemic control:   Novolog resistant tid with meals and HS, Lantus 30 units bid, Novolog 14 units tid with  meals Inpatient Diabetes Program Recommendations:   RN states that patient ate very little breakfast and slept throughout the morning.  Note that lunch blood sugar increased.  We discussed the purpose of meal coverage to prevent spikes in blood sugars after eating.  -May consider increasing Lantus to 34 units bid.   -Consider adding Hold parameters for Novolog meal coverage so that it is given with meals and only if patient eats >50%.   Thanks,  Adah Perl, RN, BC-ADM Inpatient Diabetes Coordinator Pager [phone] ([id number])

## 2018-04-25 NOTE — Consult Note (Signed)
Cardiology Consultation:   Patient ID: Jerry Parsons; [id number]; 1951/09/07   Admit date: 04/22/2018 Date of Consult: 04/25/2018  Primary Care Provider: Jerrol Banana., MD Primary Cardiologist: Esmond Plants, MD PhD Primary Electrophysiologist:  None   Patient Profile:   Jerry Parsons is a 67 y.o. male with a hx of remote atrial fibrillation/flutter not on anticoagulation, hypertension, remote DVT, obstructive sleep apnea, and spinal stenosis status post multiple back surgeries, who is being seen today for the evaluation of MRSA bacteremia at the request of Dr. Leslye Peer.  History of Present Illness:   Mr. Math was in his usual state of health until 3 days ago, when he developed chills and a fever up to 102 F at home.  His wife notes that Mr. Bahl also seemed tired and confused at the time.  Mr. Burley denies any symptoms surrounding this and currently feels well.  Hospital course has been complicated by hypoxic respiratory failure with concern for aspiration pneumonia.  He remains on 4 L of oxygen by nasal cannula.  Mr. Bacigalupi has not had any chest pain nor palpitations, lightheadedness, or orthopnea.  He usually wears CPAP at night, though in the 2 days leading up to his hospitalization, he was sleeping in a recliner and not using his CPAP.  Mr. Roye notes several cuts on his hands a few weeks ago while repairing and aerator.  1 area in particular was very slow to heal, and in retrospect, he is concerned that may have led to his MRSA bacteremia.  Mr. Scheel underwent EGD with esophageal dilation in 12/2017.  He denies having any dysphasia before or after the procedure.  Past Medical History:  Diagnosis Date  . Arrhythmia    tachycardia, A-Fib  . Arthritis   . Blind right eye   . BPH (benign prostatic hyperplasia)   . Diabetes mellitus   . DVT (deep venous thrombosis) ([place]) 02/2010   leg thrombus ; dislodged into emboli and caused PE  . Dyspnea   .  Dysrhythmia   . 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 GERD (gastroesophageal reflux disease)   . Hypercholesteremia   . Hypertension   . Kidney failure   . Neuromuscular disorder ([place])   . Neuropathy   . Pneumonia    time 9 ;last episode 12/2015  . Pulmonary embolus ([place]) 2011  . Seasonal allergies   . Seizures ([place])    as child   . Sleep apnea    BIPAP  . Stiff neck    limited turning s/p titanium plate placement  . Stroke ([place])   . TIA (transient ischemic attack)   . Wears dentures    full upper and lower    Past Surgical History:  Procedure Laterality Date  . APPENDECTOMY    . BACK SURGERY     x 8; upper x 3 & lower x 5  . CARDIOVERSION  03/14/13, 10/16   2014 - [place], 2016 - [place]  . CATARACT EXTRACTION W/PHACO Left 10/29/2015   Procedure: CATARACT EXTRACTION PHACO AND INTRAOCULAR LENS PLACEMENT (IOC);  Surgeon: Leandrew Koyanagi, MD;  Location: [place];  Service: Ophthalmology;  Laterality: Left;  DIABETIC - insulin and oral medsSleep apnea - no machine  . CHOLECYSTECTOMY    . COLONOSCOPY WITH PROPOFOL N/A 01/16/2018   Procedure: COLONOSCOPY WITH PROPOFOL;  Surgeon: Manya Silvas, MD;  Location: [facility] ENDOSCOPY;  Service: Endoscopy;  Laterality: N/A;  . ESOPHAGOGASTRODUODENOSCOPY (EGD) WITH PROPOFOL N/A 01/16/2018  Procedure: ESOPHAGOGASTRODUODENOSCOPY (EGD) WITH PROPOFOL;  Surgeon: Manya Silvas, MD;  Location: [facility] ENDOSCOPY;  Service: Endoscopy;  Laterality: N/A;  . EYE SURGERY    . GALLBLADDER SURGERY  2002  . JOINT REPLACEMENT Right 2018  . KNEE ARTHROSCOPY     left   . ROTATOR CUFF REPAIR  2001   left   . TONSILLECTOMY    . TOTAL KNEE ARTHROPLASTY Right 06/20/2017   Procedure: RIGHT TOTAL KNEE ARTHROPLASTY;  Surgeon: Gaynelle Arabian, MD;  Location: [facility];  Service: Orthopedics;  Laterality: Right;     Home Medications:  Prior to Admission medications   Medication Sig Start Date Caidence Kaseman Date Taking? Authorizing Provider    amiodarone (PACERONE) 200 MG tablet TAKE 1 TABLET (200 MG TOTAL) BY MOUTH DAILY. Patient taking differently: Take 200 mg by mouth daily.  05/10/17  Yes Vaughan Basta, MD  aspirin 81 MG tablet Take 81 mg by mouth daily.   Yes [provider]  CINNAMON PO Take 1,000 mg by mouth 2 (two) times daily.    Yes [provider]  cyclobenzaprine (FLEXERIL) 10 MG tablet Take 10 mg by mouth 3 (three) times daily.    Yes [provider]  dutasteride (AVODART) 0.5 MG capsule Take 1 capsule (0.5 mg total) by mouth daily. 08/17/17  Yes Jerrol Banana., MD  esomeprazole (NEXIUM) 40 MG capsule TAKE 1 CAPSULE TWICE DAILY 03/07/18  Yes Jerrol Banana., MD  fexofenadine (ALLEGRA) 180 MG tablet Take 180 mg by mouth daily.     Yes [provider]  furosemide (LASIX) 40 MG tablet Take 1 tablet (40 mg total) by mouth daily. 12/30/17  Yes Jerrol Banana., MD  glucose blood (ONE TOUCH ULTRA TEST) test strip CHECK SUGAR TWICE DAILY. DX- E11.8 11/28/17  Yes Jerrol Banana., MD  hydrochlorothiazide (HYDRODIURIL) 12.5 MG tablet TAKE 2 TABLETS (25 MG TOTAL) BY MOUTH AT BEDTIME. 03/13/18  Yes Jerrol Banana., MD  insulin aspart (NOVOLOG FLEXPEN) 100 UNIT/ML FlexPen INJECT 14 UNITS 3 TIMES A   DAY (MORNING, NOON, & NIGHT) Patient taking differently: Inject 12 Units into the skin 3 (three) times daily with meals.  09/29/17  Yes Jerrol Banana., MD  Insulin Pen Needle (PEN NEEDLES) 31G X 5 MM MISC 1 each by Does not apply route daily. PATIENT NEEDS NOVA TWIST NEEDLES 5 MM  DX E11.9 10/18/16  Yes Jerrol Banana., MD  JANUVIA 100 MG tablet TAKE 1 TABLET DAILY 12/15/17  Yes Jerrol Banana., MD  LANTUS SOLOSTAR 100 UNIT/ML Solostar Pen Inject 30 Units into the skin 2 (two) times daily. 06/09/17  Yes Jerrol Banana., MD  liraglutide (VICTOZA) 18 MG/3ML SOPN INJECT 0.3ML (=1.8MG ) INTO THE SKIN DAILY 11/02/17  Yes Jerrol Banana., MD   magnesium oxide (MAG-OX) 400 MG tablet TAKE 1 TABLET (400 MG TOTAL) BY MOUTH 2 (TWO) TIMES DAILY. 04/07/18  Yes Jerrol Banana., MD  metFORMIN (GLUCOPHAGE) 1000 MG tablet TAKE 1 TABLET (1,000 MG TOTAL) BY MOUTH 2 (TWO) TIMES DAILY. 07/13/17  Yes Jerrol Banana., MD  metoprolol tartrate (LOPRESSOR) 100 MG tablet TAKE 1 TABLET (100 MG TOTAL) BY MOUTH 2 (TWO) TIMES DAILY. 02/19/18  Yes Jerrol Banana., MD  Multiple Vitamins-Minerals (CENTRUM SILVER PO) Take 1 tablet by mouth daily.    Yes [provider]  NOVOTWIST 32G X 5 MM MISC USE 6 TIMES A DAY DX E11.9 04/10/18  Yes Jerrol Banana., MD  Omega-3 Fatty Acids (FISH OIL) 1200 MG CAPS Take 1,200 mg by mouth 2 (two) times daily.    Yes [provider]  oxyCODONE (OXYCONTIN) 40 mg 12 hr tablet Take 1 tablet (40 mg total) by mouth every 8 (eight) hours as needed. Patient taking differently: Take 40 mg by mouth every 8 (eight) hours.  02/23/18  Yes Jerrol Banana., MD  pregabalin (LYRICA) 200 MG capsule Take 200 mg by mouth 2 (two) times daily with breakfast and lunch.   Yes [provider]  pregabalin (LYRICA) 300 MG capsule qhs 03/08/18  Yes Jerrol Banana., MD  ranitidine (ZANTAC) 300 MG tablet Take 1 tablet (300 mg total) by mouth daily. 01/18/18  Yes Jerrol Banana., MD  sucralfate (CARAFATE) 1 g tablet TAKE 1 TABLET (1 G TOTAL) BY MOUTH 4 (FOUR) TIMES DAILY - WITH MEALS AND AT BEDTIME. 02/13/18  Yes Jerrol Banana., MD  Testosterone Cypionate (DEPO-TESTOSTERONE IM) Inject into the muscle every 21 ( twenty-one) days.   Yes [provider]    Inpatient Medications: Scheduled Meds: . amiodarone  200 mg Oral Daily  . aspirin EC  81 mg Oral Daily  . atorvastatin  40 mg Oral q1800  . chlorhexidine  15 mL Mouth Rinse BID  . cyclobenzaprine  10 mg Oral BID  . dutasteride  0.5 mg Oral Daily  . enoxaparin (LOVENOX) injection  40 mg Subcutaneous Q24H  . famotidine  20  mg Oral Daily  . furosemide  40 mg Oral Daily  . hydrochlorothiazide  25 mg Oral QHS  . insulin aspart  0-20 Units Subcutaneous TID WC  . insulin aspart  0-5 Units Subcutaneous QHS  . insulin aspart  14 Units Subcutaneous TID WC  . insulin glargine  30 Units Subcutaneous BID  . ipratropium-albuterol  3 mL Nebulization Q6H  . mouth rinse  15 mL Mouth Rinse BID  . metoprolol tartrate  100 mg Oral BID  . multivitamin with minerals  1 tablet Oral Daily  . oxyCODONE  40 mg Oral Q8H  . pantoprazole  40 mg Oral Daily  . pregabalin  200 mg Oral BID  . pregabalin  300 mg Oral QHS  . sucralfate  1 g Oral TID WC & HS   Continuous Infusions: . ampicillin-sulbactam (UNASYN) IV Stopped (04/25/18 1027)  . vancomycin 1,500 mg (04/25/18 1308)   PRN Meds: acetaminophen, bisacodyl, ondansetron (ZOFRAN) IV, oxyCODONE **OR** oxyCODONE, senna-docusate  Allergies:    Allergies  Allergen Reactions  . Carisoprodol Itching    Social History:   Social History   Socioeconomic History  . Marital status: Married    Spouse name: Not on file  . Number of children: Not on file  . Years of education: Not on file  . Highest education level: Not on file  Occupational History  . Not on file  Social Needs  . Financial resource strain: Not on file  . Food insecurity:    Worry: Not on file    Inability: Not on file  . Transportation needs:    Medical: Not on file    Non-medical: Not on file  Tobacco Use  . Smoking status: Former Smoker    Packs/day: 2.00    Years: 20.00    Pack years: 40.00    Types: Cigarettes    Last attempt to quit: 12/26/1989    Years since quitting: 28.3  . Smokeless tobacco: Never Used  Substance  and Sexual Activity  . Alcohol use: No  . Drug use: No  . Sexual activity: Not on file  Lifestyle  . Physical activity:    Days per week: Not on file    Minutes per session: Not on file  . Stress: Not on file  Relationships  . Social connections:    Talks on phone: Not on  file    Gets together: Not on file    Attends religious service: Not on file    Active member of club or organization: Not on file    Attends meetings of clubs or organizations: Not on file    Relationship status: Not on file  . Intimate partner violence:    Fear of current or ex partner: Not on file    Emotionally abused: Not on file    Physically abused: Not on file    Forced sexual activity: Not on file  Other Topics Concern  . Not on file  Social History Narrative  . Not on file    Family History:   Family History  Problem Relation Age of Onset  . Heart attack Brother      ROS:  Please see the history of present illness. All other ROS reviewed and negative.     Physical Exam/Data:   Vitals:   04/25/18 0434 04/25/18 0812 04/25/18 0927 04/25/18 1110  BP: 113/64  111/82 115/79  Pulse: 85  (!) 103 99  Resp: 20  17 19   Temp: 99.4 F (37.4 C)  99.1 F (37.3 C) 98.8 F (37.1 C)  TempSrc: Oral  Oral Oral  SpO2: 92% 94% 91% 93%  Weight:      Height:        Intake/Output Summary (Last 24 hours) at 04/25/2018 1351 Last data filed at 04/25/2018 1028 Gross per 24 hour  Intake 1050 ml  Output 800 ml  Net 250 ml   Filed Weights   04/22/18 0535  Weight: 276 lb 7.3 oz (125.4 kg)   Body mass index is 38.56 kg/m.  General:  Well nourished, well developed, in no acute distress.  His wife is at the bedside. HEENT: normal Lymph: no adenopathy Neck: no JVD Endocrine:  No thryomegaly Vascular: No carotid bruits; FA pulses 2+ bilaterally without bruits  Cardiac: Regular rate and rhythm with 2/6 holosystolic murmur loudest at the left lower sternal border. Lungs:  clear to auscult coarse breath sounds bilaterally with scattered rhonchi.  No wheezes or crackles. Abd: Obese, soft, and nontender.  Unable to assess HSM due to body habitus. Ext: 1+ pretibial edema bilaterally. Musculoskeletal:  No deformities, BUE and BLE strength normal and equal Skin: warm and dry  Neuro:  CNs  2-12 intact, no focal abnormalities noted Psych:  Normal affect   EKG: None available.   Telemetry: None available.  Relevant CV Studies: Transthoracic echocardiogram (04/24/18): - Procedure narrative: Transthoracic echocardiography. Image   quality was suboptimal. The study was technically difficult, as a   result of poor acoustic windows and body habitus. - Left ventricle: The cavity size was normal. There was mild   concentric hypertrophy. Systolic function was normal. The   estimated ejection fraction was in the range of 55% to 60%. Left   ventricular diastolic function parameters were normal. - Left atrium: The atrium was mildly dilated. - Right atrium: The atrium was mildly dilated. - Pulmonary arteries: Systolic pressure could not be accurately   estimated.  Laboratory Data:  Chemistry Recent Labs  Lab 04/23/18 [phone] 04/24/18 0404  04/25/18 0525  NA 138 136 134*  K 3.5 3.8 3.7  CL 105 100* 99*  CO2 29 30 30   GLUCOSE 144* 191* 222*  BUN 16 15 13   CREATININE 0.99 1.12 1.00  CALCIUM 8.5* 9.1 9.0  GFRNONAA >60 >60 >60  GFRAA >60 >60 >60  ANIONGAP 4* 6 5    Recent Labs  Lab 04/22/18 0254  PROT 7.3  ALBUMIN 3.9  AST 45*  ALT 35  ALKPHOS 110  BILITOT 0.6   Hematology Recent Labs  Lab 04/22/18 0254 04/23/18 0721 04/24/18 0404 04/25/18 0525  WBC 13.5* 8.1  --  5.9  RBC 5.25 4.08*  --  4.08*  HGB 12.9* 10.1*  --  10.2*  HCT 39.7* 31.5*  --  30.9*  MCV 75.6* 77.3*  --  75.7*  MCH 24.5* 24.7*  --  25.0*  MCHC 32.4 31.9*  --  33.0  RDW 17.9* 18.3*  --  18.3*  PLT 133* 86* 110* 108*   Cardiac Enzymes Recent Labs  Lab 04/22/18 0254 04/22/18 0641  TROPONINI <0.03 0.04*   No results for input(s): TROPIPOC in the last 168 hours.  BNP Recent Labs  Lab 04/22/18 0254  BNP 49.0    DDimer No results for input(s): DDIMER in the last 168 hours.  Radiology/Studies:  Dg Chest Port 1 View  Result Date: 04/22/2018 CLINICAL DATA:  Fever and left-sided back  and flank pain. History of pneumonia. Cough and shortness of breath. EXAM: PORTABLE CHEST 1 VIEW COMPARISON:  07/13/2017 FINDINGS: Shallow inspiration with atelectasis in the lung bases. Mild cardiac enlargement. No vascular congestion or edema. Slight infiltration in the right lung base may reflect pneumonia. No blunting of costophrenic angles. No pneumothorax. Postoperative changes in the cervical spine. IMPRESSION: Cardiac enlargement. Focal infiltration in the right lung base may indicate pneumonia. Electronically Signed   By: Lucienne Capers M.D.   On: 04/22/2018 03:32    Assessment and Plan:   MRSA bacteremia 1/6 blood cultures has returned positive for MRSA.  However, Mr. Orman was still febrile up to 102.5 yesterday.  Transthoracic echocardiogram was technically difficult but did not show an obvious vegetation.  Under the circumstances, I think it is reasonable to proceed with transesophageal echocardiogram for further evaluation and to exclude endocarditis.  I am somewhat concerned by Mr. Ovitt's continued hypoxia as well as history of esophageal dilation earlier this year.  There is no urgency to proceeding with TEE.  Wean oxygen as tolerated.  Once Mr. Legendre's oxygen saturation is stable (greater than 92%) on 2 L or less of supplemental oxygen, we will plan to proceed with TEE.  I will make him n.p.o. after midnight tonight and reassess his respiratory status in the morning.  The patient was evaluated by Dr. Allen Norris (GI) earlier this admission.  I have spoken with Dr. Allen Norris, who feels that there is no contraindication from a GI standpoint to proceed with TEE based on the EGD from January.  Continue antimicrobial therapy per internal medicine and the infectious disease service.  Elevated troponin Troponin noted to be minimally elevated at 0.04 shortly after presentation.  In the setting of bacteremia and lack of chest pain, I suspect this represents supply-demand mismatch.  Obtain  twelve-lead EKG.  No further work-up unless objective signs or symptoms of myocardial ischemia develop.  Paroxymal atrial flutter No symptoms reported by the patient.  He is not on telemetry at this time, though has not had a recurrence for quite some time per Dr.  Gollan's most recent clinic note.  Mr. Meir remains on amiodarone without anticoagulation.  Given a CHADSVASC score of at least 3 (age, diabetes mellitus, and hypertension), anticoagulation is indicated.  However, in the setting of acute illness and anemia, I will defer this decision to Mr. Arkin's primary cardiologist, Dr. Rockey Situ.  Continue amiodarone.  Acute respiratory failure with hypoxia secondary to suspected aspiration pneumonia  Continue antimicrobial therapy per medicine and infectious diseases.  Agree with pulmonary consultation.  Wean oxygen, as tolerated.  Chronic diastolic heart failure Chronic appearing lower extremity edema noted on exam.  Some element of hypoxia could also be related to diastolic heart failure.  Admission weight on 4/27 up 14 pounds from most recent office weight in 05/2017.  Continue gentle diuresis.  May need to escalate furosemide if hypoxia does not improve or there is evidence of worsening fluid retention.  Instructive sleep apnea  Continue nocturnal CPAP.  For questions or updates, please contact [place] Please consult [email or web address] for contact info under [facility].   Signed, Nelva Bush, MD  04/25/2018 1:51 PM

## 2018-04-26 ENCOUNTER — Inpatient Hospital Stay: Payer: Self-pay

## 2018-04-26 ENCOUNTER — Other Ambulatory Visit: Payer: Self-pay | Admitting: Family Medicine

## 2018-04-26 ENCOUNTER — Encounter
Admission: EM | Disposition: A | Discharge status: Home Health | Payer: Self-pay | Source: Home / Self Care | Attending: Internal Medicine

## 2018-04-26 DIAGNOSIS — R110 Nausea: Secondary | ICD-10-CM

## 2018-04-26 HISTORY — PX: TEE WITHOUT CARDIOVERSION: SHX5443

## 2018-04-26 LAB — GLUCOSE, CAPILLARY
Glucose-Capillary: 210 mg/dL — ABNORMAL HIGH (ref 65–99)
Glucose-Capillary: 196 mg/dL — ABNORMAL HIGH (ref 65–99)
Glucose-Capillary: 230 mg/dL — ABNORMAL HIGH (ref 65–99)
Glucose-Capillary: 162 mg/dL — ABNORMAL HIGH (ref 65–99)

## 2018-04-26 LAB — VANCOMYCIN, TROUGH: Vancomycin Tr: 15 ug/mL (ref 15–20)

## 2018-04-26 SURGERY — ECHOCARDIOGRAM, TRANSESOPHAGEAL
Anesthesia: Moderate Sedation

## 2018-04-26 MED ORDER — SODIUM CHLORIDE 0.9% FLUSH: 10.0000 mL | INTRAVENOUS | Status: DC | PRN

## 2018-04-26 MED ORDER — SODIUM CHLORIDE 0.9% FLUSH
10.0000 mL | Freq: Two times a day (BID) | INTRAVENOUS | Status: DC
  Administered 2018-04-26 – 2018-04-28 (×4): 10 mL

## 2018-04-26 MED ORDER — FUROSEMIDE 40 MG PO TABS
40.0000 mg | ORAL_TABLET | Freq: Two times a day (BID) | ORAL | Status: DC
  Administered 2018-04-26 – 2018-04-28 (×5): 40 mg via ORAL
  Filled 2018-04-26 (×5): qty 1

## 2018-04-26 NOTE — Progress Notes (Signed)
[facility]  Patient was brought to specials/recovery for TEE.  Upon arrival, he was found to be hypoxic with an O2 saturation of 82%.  Supplemental oxygen has been increased to 5L via [place] to maintain O2 sat > 90%.  Given significant oxygen requirement, I have recommended that we defer TEE to minimize the risk for respiratory complications during the procedure.  I recommend continued treatment for presumed aspiration pneumonia.  I will also increase furosemide to 40 mg BID to optimize volume status, in case there is an element of diastolic heart failure contributing to his hypoxia.  Ability to perform TEE will be reevaluated tomorrow.  Nelva Bush, MD [facility] Pager: [phone] 04/26/18 @ 12:07 PM

## 2018-04-26 NOTE — Progress Notes (Signed)
Pharmacy Antibiotic Note  Jerry Parsons is a 67 y.o. male admitted on 04/22/2018 with bacteremia.  Pharmacy has been consulted for vancomycin dosing. Unasyn added 4/29 for aspiration PNA coverage as pt spiking fevers.   Plan:  5/1 Vancomycin trough therapeutic at 15mcg/mL, will continue current dosing and monitor. Adjust if renal function changes.   Vancomycin vancomycin 1250mg  IV every 12 hours.  Goal trough 15-20 mcg/mL.  T1/2 11.55 hrs estimated  4/29@1230  vancomycin trough before 4th dose  4/29 vanc trough at 1340 = 12 mcg/ml - drawn one hour late so true trough likely a little higher. Will increase dose to vancomycin 1500 mg IV q12h and recheck trough before 4th new dose.    Unasyn 3 g IV q6h.    Height: 5\' 11"  (180.3 cm) Weight: 276 lb (125.2 kg) IBW/kg (Calculated) : 75.3  Temp (24hrs), Avg:99.1 F (37.3 C), Min:98.9 F (37.2 C), Max:99.3 F (37.4 C)  Recent Labs  Lab 04/22/18 0254 04/22/18 0447 04/23/18 0721 04/24/18 0404 04/24/18 1340 04/25/18 0525 04/26/18 1225  WBC 13.5*  --  8.1  --   --  5.9  --   CREATININE 1.42*  --  0.99 1.12  --  1.00  --   LATICACIDVEN 2.9* 2.0*  --  1.3  --   --   --   VANCOTROUGH  --   --   --   --  12*  --  15    Estimated Creatinine Clearance: 95.3 mL/min (by C-G formula based on SCr of 1 mg/dL).    Allergies  Allergen Reactions  . Carisoprodol Itching    Antimicrobials this admission: Anti-infectives (From admission, onward)   Start     Dose/Rate Route Frequency Ordered Stop   04/25/18 0100  vancomycin (VANCOCIN) 1,500 mg in sodium chloride 0.9 % 500 mL IVPB     1,500 mg 250 mL/hr over 120 Minutes Intravenous Every 12 hours 04/24/18 1547     04/24/18 1600  Ampicillin-Sulbactam (UNASYN) 3 g in sodium chloride 0.9 % 100 mL IVPB     3 g 200 mL/hr over 30 Minutes Intravenous Every 6 hours 04/24/18 1505     04/23/18 0100  vancomycin (VANCOCIN) 1,250 mg in sodium chloride 0.9 % 250 mL IVPB  Status:  Discontinued     1,250  mg 166.7 mL/hr over 90 Minutes Intravenous Every 12 hours 04/22/18 2028 04/24/18 1547   04/22/18 2200  cefTRIAXone (ROCEPHIN) 1 g in sodium chloride 0.9 % 100 mL IVPB  Status:  Discontinued     1 g 200 mL/hr over 30 Minutes Intravenous Every 24 hours 04/22/18 0535 04/22/18 0743   04/22/18 2015  vancomycin (VANCOCIN) 2,000 mg in sodium chloride 0.9 % 500 mL IVPB     2,000 mg 250 mL/hr over 120 Minutes Intravenous  Once 04/22/18 2000 04/22/18 2317   04/22/18 1800  cefTRIAXone (ROCEPHIN) 1 g in sodium chloride 0.9 % 100 mL IVPB  Status:  Discontinued     1 g 200 mL/hr over 30 Minutes Intravenous Every 24 hours 04/22/18 0743 04/22/18 2304   04/22/18 1800  doxycycline (VIBRAMYCIN) 100 mg in sodium chloride 0.9 % 250 mL IVPB  Status:  Discontinued     100 mg 125 mL/hr over 120 Minutes Intravenous Every 12 hours 04/22/18 0743 04/22/18 2303   04/22/18 1500  doxycycline (VIBRAMYCIN) 100 mg in sodium chloride 0.9 % 250 mL IVPB  Status:  Discontinued     100 mg 125 mL/hr over 120 Minutes Intravenous Every 12  hours 04/22/18 0458 04/22/18 0743   04/22/18 0300  cefTRIAXone (ROCEPHIN) 2 g in sodium chloride 0.9 % 100 mL IVPB  Status:  Discontinued     2 g 200 mL/hr over 30 Minutes Intravenous Every 24 hours 04/22/18 0255 04/22/18 0535   04/22/18 0300  azithromycin (ZITHROMAX) 500 mg in sodium chloride 0.9 % 250 mL IVPB  Status:  Discontinued     500 mg 250 mL/hr over 60 Minutes Intravenous Every 24 hours 04/22/18 0255 04/22/18 0458       Microbiology: 4/27 BCx AM 1/2 Staph aureus 4/27 BCx PM 2/2 NGTD  4/29 BCx x2 sent 4/27 UCx NG MRSA PCR neg Thank you for allowing pharmacy to be a part of this patient's care.  Donna Christen Rease Wence 04/26/2018 1:02 PM

## 2018-04-26 NOTE — Care Management (Signed)
Confirmed with patient and wife that they are on board with home health.  Wife to learn to administered medications in the home.  Patient states he does not have a preference of agency.  Referral made to [facility] with [place].  Anticipated need for RN, and PT.

## 2018-04-26 NOTE — Progress Notes (Signed)
Per Dr. Leslye Peer okay to hold off on [place] and unasyn as IV has infiltrated until IV team has come to place PICC. Will being to administer once PICC is placed.

## 2018-04-26 NOTE — Progress Notes (Signed)
Peripherally Inserted Central Catheter/Midline Placement  The IV Nurse has discussed with the patient and/or persons authorized to consent for the patient, the purpose of this procedure and the potential benefits and risks involved with this procedure.  The benefits include less needle sticks, lab draws from the catheter, and the patient may be discharged home with the catheter. Risks include, but not limited to, infection, bleeding, blood clot (thrombus formation), and puncture of an artery; nerve damage and irregular heartbeat and possibility to perform a PICC exchange if needed/ordered by physician.  Alternatives to this procedure were also discussed.  Bard Power PICC patient education guide, fact sheet on infection prevention and patient information card has been provided to patient /or left at bedside.    PICC/Midline Placement Documentation  PICC Single Lumen 04/26/18 PICC Right Brachial 45 cm 1 cm (Active)  Indication for Insertion or Continuance of Line Home intravenous therapies (PICC only) 04/26/2018  5:22 PM  Exposed Catheter (cm) 0 cm 04/26/2018  5:22 PM  Site Assessment Clean;Dry;Intact 04/26/2018  5:22 PM  Line Status Flushed;Saline locked;Blood return noted 04/26/2018  5:22 PM  Dressing Type Transparent 04/26/2018  5:22 PM  Dressing Status Clean;Dry;Intact;Antimicrobial disc in place 04/26/2018  5:22 PM  Dressing Change Due 05/03/18 04/26/2018  5:22 PM       [place] Payment 04/26/2018, 5:23 PM

## 2018-04-26 NOTE — Progress Notes (Signed)
Patient ID: Jerry Parsons, male   DOB: Jan 21, 1951, 67 y.o.   MRN: [id number]  [employer] PROGRESS NOTE  Jerry Parsons NID:[id number] DOB: Mar 26, 1951 DOA: 04/22/2018 PCP: Jerrol Banana., MD  HPI/Subjective: Patient feeling okay.  Does not really complain of shortness of breath.  Not much cough.  Still hypoxic on room air.  Objective: Vitals:   04/26/18 1338 04/26/18 1449  BP:  139/60  Pulse: (!) 106 100  Resp:  20  Temp:  99 F (37.2 C)  SpO2: 94% 94%    Filed Weights   04/22/18 0535 04/26/18 1143  Weight: 125.4 kg (276 lb 7.3 oz) 125.2 kg (276 lb)    ROS: Review of Systems  Constitutional: Positive for fever. Negative for chills.  Eyes: Negative for blurred vision.  Respiratory: Positive for shortness of breath. Negative for cough.   Cardiovascular: Negative for chest pain.  Gastrointestinal: Negative for abdominal pain, constipation, diarrhea, nausea and vomiting.  Genitourinary: Negative for dysuria.  Musculoskeletal: Negative for joint pain.  Neurological: Negative for dizziness and headaches.   Exam: Physical Exam  Constitutional: He is oriented to person, place, and time.  HENT:  Nose: No mucosal edema.  Mouth/Throat: No oropharyngeal exudate or posterior oropharyngeal edema.  Eyes: Pupils are equal, round, and reactive to light. Conjunctivae, EOM and lids are normal.  Neck: No JVD present. Carotid bruit is not present. No edema present. No thyroid mass and no thyromegaly present.  Cardiovascular: S1 normal and S2 normal. Exam reveals no gallop.  No murmur heard. Pulses:      Dorsalis pedis pulses are 2+ on the right side, and 2+ on the left side.  Respiratory: No respiratory distress. He has decreased breath sounds in the right lower field. He has no wheezes. He has rhonchi in the right lower field. He has no rales.  GI: Soft. Bowel sounds are normal. There is no tenderness.  Musculoskeletal:       Right ankle: He exhibits swelling.       Left  ankle: He exhibits swelling.  Lymphadenopathy:    He has no cervical adenopathy.  Neurological: He is alert and oriented to person, place, and time. No cranial nerve deficit.  Skin: Skin is warm. No rash noted. Nails show no clubbing.  Psychiatric: He has a normal mood and affect.      Data Reviewed: Basic Metabolic Panel: Recent Labs  Lab 04/22/18 0254 04/22/18 0641 04/23/18 0721 04/24/18 0404 04/25/18 0525  NA 136  --  138 136 134*  K 3.6  --  3.5 3.8 3.7  CL 98*  --  105 100* 99*  CO2 29  --  29 30 30   GLUCOSE 359*  --  144* 191* 222*  BUN 26*  --  16 15 13   CREATININE 1.42*  --  0.99 1.12 1.00  CALCIUM 9.5  --  8.5* 9.1 9.0  MG  --  1.8  --  1.8  --   PHOS  --  2.6  --   --   --    Liver Function Tests: Recent Labs  Lab 04/22/18 0254  AST 45*  ALT 35  ALKPHOS 110  BILITOT 0.6  PROT 7.3  ALBUMIN 3.9   CBC: Recent Labs  Lab 04/22/18 0254 04/23/18 0721 04/24/18 0404 04/25/18 0525  WBC 13.5* 8.1  --  5.9  NEUTROABS 11.8*  --   --   --   HGB 12.9* 10.1*  --  10.2*  HCT 39.7* 31.5*  --  30.9*  MCV 75.6* 77.3*  --  75.7*  PLT 133* 86* 110* 108*   Cardiac Enzymes: Recent Labs  Lab 04/22/18 0254 04/22/18 0641  TROPONINI <0.03 0.04*   BNP (last 3 results) Recent Labs    04/22/18 0254  BNP 49.0     CBG: Recent Labs  Lab 04/25/18 1134 04/25/18 1628 04/25/18 2057 04/26/18 0737 04/26/18 1219  GLUCAP 309* 269* 229* 210* 196*    Recent Results (from the past 240 hour(s))  Blood Culture (routine x 2)     Status: None (Preliminary result)   Collection Time: 04/22/18  2:54 AM  Result Value Ref Range Status   Specimen Description BLOOD BLOOD RIGHT HAND  Final   Special Requests   Final    BOTTLES DRAWN AEROBIC AND ANAEROBIC Blood Culture adequate volume   Culture   Final    NO GROWTH 4 DAYS Performed at [facility], [address] [place], [place] [address]    Report Status PENDING  Incomplete  Blood Culture (routine x 2)      Status: Abnormal   Collection Time: 04/22/18  2:54 AM  Result Value Ref Range Status   Specimen Description   Final    BLOOD RIGHT ANTECUBITAL Performed at [facility], [address] [place], [place] [address]    Special Requests   Final    BOTTLES DRAWN AEROBIC AND ANAEROBIC Blood Culture adequate volume Performed at [facility], [address] [place], [place] [address]    Culture  Setup Time   Final    ANAEROBIC BOTTLE ONLY GRAM POSITIVE COCCI IN CLUSTERS CRITICAL RESULT CALLED TO, READ BACK BY AND VERIFIED WITH: [place] 04/22/18.PMH Performed at [facility], [place] [address], [place], [place] [address]    Culture METHICILLIN RESISTANT STAPHYLOCOCCUS AUREUS (A)  Final   Report Status 04/25/2018 FINAL  Final   Organism ID, Bacteria METHICILLIN RESISTANT STAPHYLOCOCCUS AUREUS  Final      Susceptibility   Methicillin resistant staphylococcus aureus - MIC*    CIPROFLOXACIN >=8 RESISTANT Resistant     ERYTHROMYCIN >=8 RESISTANT Resistant     GENTAMICIN <=0.5 SENSITIVE Sensitive     OXACILLIN RESISTANT Resistant     TETRACYCLINE <=1 SENSITIVE Sensitive     VANCOMYCIN <=0.5 SENSITIVE Sensitive     TRIMETH/SULFA <=10 SENSITIVE Sensitive     CLINDAMYCIN <=0.25 SENSITIVE Sensitive     RIFAMPIN <=0.5 SENSITIVE Sensitive     Inducible Clindamycin NEGATIVE Sensitive     * METHICILLIN RESISTANT STAPHYLOCOCCUS AUREUS  Blood Culture ID Panel (Reflexed)     Status: Abnormal   Collection Time: 04/22/18  2:54 AM  Result Value Ref Range Status   Enterococcus species NOT DETECTED NOT DETECTED Final   Listeria monocytogenes NOT DETECTED NOT DETECTED Final   Staphylococcus species DETECTED (A) NOT DETECTED Final    Comment: CRITICAL RESULT CALLED TO, READ BACK BY AND VERIFIED WITH: GARRETT COFFEE AT 1956 04/22/18.PMH    Staphylococcus aureus DETECTED (A) NOT DETECTED Final    Comment: Methicillin (oxacillin)-resistant Staphylococcus aureus (MRSA).  MRSA is predictably resistant to beta-lactam antibiotics (except ceftaroline). Preferred therapy is vancomycin unless clinically contraindicated. Patient requires contact precautions if  hospitalized. CRITICAL RESULT CALLED TO, READ BACK BY AND VERIFIED WITH: GARRETT COFFEE AT 1956 04/22/18.PMH    Methicillin resistance DETECTED (A) NOT DETECTED Final    Comment: CRITICAL RESULT CALLED TO, READ BACK BY AND VERIFIED WITH: GARRETT COFFEE AT 1956 04/22/18.PMH    Streptococcus  species NOT DETECTED NOT DETECTED Final   Streptococcus agalactiae NOT DETECTED NOT DETECTED Final   Streptococcus pneumoniae NOT DETECTED NOT DETECTED Final   Streptococcus pyogenes NOT DETECTED NOT DETECTED Final   Acinetobacter baumannii NOT DETECTED NOT DETECTED Final   Enterobacteriaceae species NOT DETECTED NOT DETECTED Final   Enterobacter cloacae complex NOT DETECTED NOT DETECTED Final   Escherichia coli NOT DETECTED NOT DETECTED Final   Klebsiella oxytoca NOT DETECTED NOT DETECTED Final   Klebsiella pneumoniae NOT DETECTED NOT DETECTED Final   Proteus species NOT DETECTED NOT DETECTED Final   Serratia marcescens NOT DETECTED NOT DETECTED Final   Haemophilus influenzae NOT DETECTED NOT DETECTED Final   Neisseria meningitidis NOT DETECTED NOT DETECTED Final   Pseudomonas aeruginosa NOT DETECTED NOT DETECTED Final   Candida albicans NOT DETECTED NOT DETECTED Final   Candida glabrata NOT DETECTED NOT DETECTED Final   Candida krusei NOT DETECTED NOT DETECTED Final   Candida parapsilosis NOT DETECTED NOT DETECTED Final   Candida tropicalis NOT DETECTED NOT DETECTED Final    Comment: Performed at [facility], [address] [place], [place] [address]  Urine culture     Status: None   Collection Time: 04/22/18  2:55 AM  Result Value Ref Range Status   Specimen Description   Final    URINE, RANDOM Performed at [facility], [address] [place], [place] [address]    Special Requests    Final    NONE Performed at [facility], [address], [place], [place] [address]    Culture   Final    NO GROWTH Performed at [facility], [place] [address] [place], [place] [address]    Report Status 04/23/2018 FINAL  Final  MRSA PCR Screening     Status: None   Collection Time: 04/22/18  5:53 AM  Result Value Ref Range Status   MRSA by PCR NEGATIVE NEGATIVE Final    Comment:        The GeneXpert MRSA Assay (FDA approved for NASAL specimens only), is one component of a comprehensive MRSA colonization surveillance program. It is not intended to diagnose MRSA infection nor to guide or monitor treatment for MRSA infections. Performed at [facility], [address] [place], [place] [address]   Culture, blood (Routine X 2) w Reflex to ID Panel     Status: None (Preliminary result)   Collection Time: 04/22/18 11:33 PM  Result Value Ref Range Status   Specimen Description BLOOD LEFT ANTECUBITAL  Final   Special Requests   Final    BOTTLES DRAWN AEROBIC AND ANAEROBIC Blood Culture adequate volume   Culture   Final    NO GROWTH 4 DAYS Performed at [facility], [address] [place], [place] [address]    Report Status PENDING  Incomplete  Culture, blood (Routine X 2) w Reflex to ID Panel     Status: None (Preliminary result)   Collection Time: 04/22/18 11:43 PM  Result Value Ref Range Status   Specimen Description BLOOD LEFT HAND  Final   Special Requests   Final    BOTTLES DRAWN AEROBIC AND ANAEROBIC Blood Culture results may not be optimal due to an excessive volume of blood received in culture bottles   Culture   Final    NO GROWTH 4 DAYS Performed at [facility], [address] [place], [place] [address]    Report Status PENDING  Incomplete  CULTURE, BLOOD (ROUTINE X 2) w  Reflex to ID Panel     Status: None (Preliminary result)   Collection Time: 04/24/18  4:04 AM  Result Value Ref Range Status   Specimen Description  BLOOD RIGHT ANTECUBITAL  Final   Special Requests   Final    BOTTLES DRAWN AEROBIC AND ANAEROBIC Blood Culture adequate volume   Culture   Final    NO GROWTH 2 DAYS Performed at [facility], [address], [place], [place] [address]    Report Status PENDING  Incomplete  CULTURE, BLOOD (ROUTINE X 2) w Reflex to ID Panel     Status: None (Preliminary result)   Collection Time: 04/24/18  4:06 AM  Result Value Ref Range Status   Specimen Description BLOOD LEFT HAND  Final   Special Requests   Final    BOTTLES DRAWN AEROBIC AND ANAEROBIC Blood Culture adequate volume   Culture   Final    NO GROWTH 2 DAYS Performed at [facility], [address] [place], [place] [address]    Report Status PENDING  Incomplete     Scheduled Meds: . amiodarone  200 mg Oral Daily  . aspirin EC  81 mg Oral Daily  . atorvastatin  40 mg Oral q1800  . chlorhexidine  15 mL Mouth Rinse BID  . cyclobenzaprine  10 mg Oral BID  . dutasteride  0.5 mg Oral Daily  . enoxaparin (LOVENOX) injection  40 mg Subcutaneous Q24H  . famotidine  20 mg Oral Daily  . furosemide  40 mg Oral BID  . hydrochlorothiazide  25 mg Oral QHS  . insulin aspart  0-20 Units Subcutaneous TID WC  . insulin aspart  0-5 Units Subcutaneous QHS  . insulin aspart  14 Units Subcutaneous TID WC  . insulin glargine  30 Units Subcutaneous BID  . ipratropium-albuterol  3 mL Nebulization Q6H  . mouth rinse  15 mL Mouth Rinse BID  . metoprolol tartrate  100 mg Oral BID  . multivitamin with minerals  1 tablet Oral Daily  . oxyCODONE  40 mg Oral Q8H  . pantoprazole  40 mg Oral Daily  . pregabalin  200 mg Oral BID  . pregabalin  300 mg Oral QHS  . sucralfate  1 g Oral TID WC & HS   Continuous Infusions: . vancomycin Stopped (04/26/18 0252)    Assessment/Plan:  1. Clinical sepsis present on admission secondary to pneumonia.  Lactic acidosis.  One blood culture growing MRSA.  Likely will need 4 weeks of IV vancomycin.   Repeat blood cultures are negative.  TEE to be done once respiratory status better.  PICC line today.  2. Acute hypoxic respiratory failure.  Patient on 2 L of oxygen.  pulse ox 88% on room air.  Try to taper off oxygen as quickly as possible.   cardiology increase Lasix to twice daily dosing. 3. History of sleep apnea on CPAP at night 4. History of esophageal stricture status post dilation last endoscopy 5. Type 2 diabetes mellitus on glargine insulin and sliding scale 6. Obesity with a BMI of 38.56 7. Essential hypertension on hydrochlorothiazide, metoprolol 8. Restless leg syndrome on Lyrica 9. Hyperlipidemia unspecified on atorvastatin 10. History of arrhythmia on amiodarone and metoprolol 11. Chronic pain on high-dose pain medications.  Code Status:     Code Status Orders  (From admission, onward)        Start     Ordered   04/22/18 0536  Full code  Continuous     04/22/18 0535  Code Status History    Date Active Date Inactive Code Status Order ID Comments User Context   06/20/2017 1035 06/22/2017 1801 Full Code [id number]  Gaynelle Arabian, MD Inpatient   05/08/2017 0002 05/10/2017 1512 Full Code [id number]  Harvie Bridge, DO Inpatient   09/08/2015 2107 09/09/2015 1804 Full Code [id number]  Demetrios Loll, MD Inpatient    Advance Directive Documentation     Most Recent Value  Type of Advance Directive  Healthcare Power of Attorney  Pre-existing out of facility DNR order (yellow form or pink MOST form)  -  "MOST" Form in Place?  -     Family Communication: Wife at bedside. Disposition Plan:  will need to be set up with home health for IV antibiotics  Consultants:  infectious disease  Cardiology  Antibiotics:  IV vancomycin  Time spent: 25 minutes  [place]

## 2018-04-26 NOTE — Progress Notes (Signed)
As patient arriving and placed on monitor to prepare for TEE, noting on room air, pt asymptomatic, rr nl. , however sats at 82%, thus placed on 3l/min oxygen, after 5-10 min's  Increased to 4l/mlin, consulted Dr End who has come to evaluate patient, decision made to hold on TEE today, and reevaluate tomorrow,report called to Abby RN on floor.

## 2018-04-26 NOTE — Care Management (Addendum)
Per MD anticipated discharge home with home IV vanc.  Heads up referral given to [facility] from [facility] for medication needs.  Will discuss with patient to arrange home health services.  Awaiting official order from ID.  PT has seen patient again today and now recommends home health PT.  Patient still requiring acute O2

## 2018-04-26 NOTE — Progress Notes (Signed)
SLP Cancellation Note  Patient Details Name: Jerry Parsons MRN: [id number] DOB: August 22, 1951   Cancelled treatment:       Reason Eval/Treat Not Completed: Patient at procedure or test/unavailable(chart reviewed. pt NPO for procedure. Will f/u tomorrow.)    Orinda Kenner, MS, CCC-SLP Kellin Fifer 04/26/2018, 3:29 PM

## 2018-04-26 NOTE — Progress Notes (Signed)
Physical Therapy Treatment Patient Details Name: Jerry Parsons MRN: [id number] DOB: 07-Mar-1951 Today's Date: 04/26/2018    History of Present Illness 67 yo male with onset of  fever and confusion at home, admitted with hypoxic respiratory failure and susp aspiration PNA.  He is on 3L O2, was off CPAP prior to his admission.  PMHx:  a-fib, R eye blind, DM, DVT, PE, HTN, seizures, TIA, PN,   PT Comments    Mr. Shimabukuro was very pleasant and agreeable to work with PT.  He required min guard assist for safety with sit<>stand transfers and ambulation due to unsteadiness, although no LOB noted.  Pt takes several standing rest breaks as directed by this PT due to SpO2 level.  Pt's SpO2 dropping down as low as 88% on 2L O2, each time improving to mid 90s within 20 seconds of standing rest break and focus on breathing technique.  Pulse up as high as 125 when ambulating. Updated recommendations to HHPT at d/c for continued effort on improved pulmonary endurance and balance.    Follow Up Recommendations  Home health PT     Equipment Recommendations  None recommended by PT    Recommendations for Other Services       Precautions / Restrictions Precautions Precautions: Fall;Other (comment) Precaution Comments: O2, monitor pulse Restrictions Weight Bearing Restrictions: No    Mobility  Bed Mobility               General bed mobility comments: Pt sitting in chair at start and end of session  Transfers Overall transfer level: Needs assistance Equipment used: None Transfers: Sit to/from Stand Sit to Stand: Min guard         General transfer comment: Mild unsteadiness but no no LOB and pt does not require physical assist or cues  Ambulation/Gait Ambulation/Gait assistance: Min guard Ambulation Distance (Feet): 80 Feet(20, 20, 20, 20) Assistive device: None Gait Pattern/deviations: Decreased stride length;Wide base of support Gait velocity: slightly decreased   General Gait  Details: Pt ambulates with a wide BOS and occasional mild unsteadiness but no LOB.  Pt takes several standing rest breaks as directed by this PT due to SpO2 level.  Pt's SpO2 dropping down as low as 88% on 2L O2, each time improving to mid 90s within 20 seconds of standing rest break and focus on breathing technique.  Pulse up as high as 125 when ambulating.    Stairs             Wheelchair Mobility    Modified Rankin (Stroke Patients Only)       Balance Overall balance assessment: Needs assistance Sitting-balance support: Feet supported;No upper extremity supported Sitting balance-Leahy Scale: Good     Standing balance support: During functional activity;No upper extremity supported Standing balance-Leahy Scale: Fair Standing balance comment: Pt able to stand statically and ambulate without UE assist but with mild unsteadiness and would likely lose his balance with perturbation.                             Cognition Arousal/Alertness: Awake/alert Behavior During Therapy: WFL for tasks assessed/performed Overall Cognitive Status: Within Functional Limits for tasks assessed                                        Exercises General Exercises - Lower Extremity Ankle Circles/Pumps: AROM;Both;10 reps;Seated  Long Arc Quad: AROM;Both;10 reps;Seated Hip Flexion/Marching: Both;10 reps;Seated    General Comments General comments (skin integrity, edema, etc.): Emphasized the importance of pacing himself when he is d/c to home.  It will be important for him to take rest breaks before he becomes too fatigued.        Pertinent Vitals/Pain Pain Assessment: No/denies pain    Home Living                      Prior Function            PT Goals (current goals can now be found in the care plan section) Acute Rehab PT Goals Patient Stated Goal: to get stronger and get home so he can go to the beach PT Goal Formulation: With patient Time For  Goal Achievement: 05/09/18 Potential to Achieve Goals: Good Progress towards PT goals: Progressing toward goals    Frequency    Min 2X/week      PT Plan Current plan remains appropriate    Co-evaluation              AM-PAC PT "6 Clicks" Daily Activity  Outcome Measure  Difficulty turning over in bed (including adjusting bedclothes, sheets and blankets)?: None Difficulty moving from lying on back to sitting on the side of the bed? : A Little Difficulty sitting down on and standing up from a chair with arms (e.g., wheelchair, bedside commode, etc,.)?: A Little Help needed moving to and from a bed to chair (including a wheelchair)?: A Little Help needed walking in hospital room?: A Little Help needed climbing 3-5 steps with a railing? : A Little 6 Click Score: 19    End of Session Equipment Utilized During Treatment: Gait belt;Oxygen Activity Tolerance: Patient tolerated treatment well;Treatment limited secondary to medical complications (Comment)(hypoxia) Patient left: with call bell/phone within reach;in chair;with chair alarm set;with family/visitor present Nurse Communication: Mobility status;Other (comment)(SpO2; RN already aware of pulse) PT Visit Diagnosis: Difficulty in walking, not elsewhere classified (R26.2);Other (comment);Unsteadiness on feet (R26.81)(medical symptoms of low O2 sats)     Time: 1419-1440 PT Time Calculation (min) (ACUTE ONLY): 21 min  Charges:  $Gait Training: 8-22 mins                    G Codes:       Collie Siad PT, DPT 04/26/2018, 2:52 PM

## 2018-04-26 NOTE — Progress Notes (Signed)
[facility] INFECTIOUS DISEASE PROGRESS NOTE Date of Admission:  04/22/2018     ID: THEOPHILUS WALZ is a 67 y.o. male with MRSA bacteremia Active Problems:   Sepsis due to pneumonia ([place])  Subjective: No fevers, could not get TEE. Got oob and worked with PT  ROS  Eleven systems are reviewed and negative except per hpi  Medications:  Antibiotics Given (last 72 hours)    Date/Time Action Medication Dose Rate   04/24/18 0139 New Bag/Given   vancomycin (VANCOCIN) 1,250 mg in sodium chloride 0.9 % 250 mL IVPB 1,250 mg 166.7 mL/hr   04/24/18 1336 New Bag/Given   vancomycin (VANCOCIN) 1,250 mg in sodium chloride 0.9 % 250 mL IVPB 1,250 mg 166.7 mL/hr   04/24/18 1616 New Bag/Given   Ampicillin-Sulbactam (UNASYN) 3 g in sodium chloride 0.9 % 100 mL IVPB 3 g 200 mL/hr   04/25/18 0008 New Bag/Given   Ampicillin-Sulbactam (UNASYN) 3 g in sodium chloride 0.9 % 100 mL IVPB 3 g 200 mL/hr   04/25/18 0059 New Bag/Given   vancomycin (VANCOCIN) 1,500 mg in sodium chloride 0.9 % 500 mL IVPB 1,500 mg 250 mL/hr   04/25/18 0535 New Bag/Given   Ampicillin-Sulbactam (UNASYN) 3 g in sodium chloride 0.9 % 100 mL IVPB 3 g 200 mL/hr   04/25/18 0933 New Bag/Given   Ampicillin-Sulbactam (UNASYN) 3 g in sodium chloride 0.9 % 100 mL IVPB 3 g 200 mL/hr   04/25/18 1308 New Bag/Given   vancomycin (VANCOCIN) 1,500 mg in sodium chloride 0.9 % 500 mL IVPB 1,500 mg 250 mL/hr   04/25/18 1734 New Bag/Given   Ampicillin-Sulbactam (UNASYN) 3 g in sodium chloride 0.9 % 100 mL IVPB 3 g 200 mL/hr   04/25/18 2226 New Bag/Given   Ampicillin-Sulbactam (UNASYN) 3 g in sodium chloride 0.9 % 100 mL IVPB 3 g 200 mL/hr   04/26/18 0052 New Bag/Given   vancomycin (VANCOCIN) 1,500 mg in sodium chloride 0.9 % 500 mL IVPB 1,500 mg 250 mL/hr   04/26/18 0335 New Bag/Given   Ampicillin-Sulbactam (UNASYN) 3 g in sodium chloride 0.9 % 100 mL IVPB 3 g 200 mL/hr     . amiodarone  200 mg Oral Daily  . aspirin EC  81 mg Oral Daily  .  atorvastatin  40 mg Oral q1800  . chlorhexidine  15 mL Mouth Rinse BID  . cyclobenzaprine  10 mg Oral BID  . dutasteride  0.5 mg Oral Daily  . enoxaparin (LOVENOX) injection  40 mg Subcutaneous Q24H  . famotidine  20 mg Oral Daily  . furosemide  40 mg Oral BID  . hydrochlorothiazide  25 mg Oral QHS  . insulin aspart  0-20 Units Subcutaneous TID WC  . insulin aspart  0-5 Units Subcutaneous QHS  . insulin aspart  14 Units Subcutaneous TID WC  . insulin glargine  30 Units Subcutaneous BID  . ipratropium-albuterol  3 mL Nebulization Q6H  . mouth rinse  15 mL Mouth Rinse BID  . metoprolol tartrate  100 mg Oral BID  . multivitamin with minerals  1 tablet Oral Daily  . oxyCODONE  40 mg Oral Q8H  . pantoprazole  40 mg Oral Daily  . pregabalin  200 mg Oral BID  . pregabalin  300 mg Oral QHS  . sucralfate  1 g Oral TID WC & HS    Objective: Vital signs in last 24 hours: Temp:  [98.9 F (37.2 C)-99.3 F (37.4 C)] 99 F (37.2 C) (05/01 1449) Pulse Rate:  [  77-128] 100 (05/01 1449) Resp:  [16-24] 20 (05/01 1449) BP: (116-142)/(60-72) 139/60 (05/01 1449) SpO2:  [82 %-96 %] 94 % (05/01 1449) Weight:  [125.2 kg (276 lb)] 125.2 kg (276 lb) (05/01 1143) Constitutional: He is oriented to person, place, and time. Obese. On 4L o2 HENT: anicteric Mouth/Throat: Oropharynx is clear and moist. No oropharyngeal exudate.  Cardiovascular: Normal rate, regular rhythm 2/6 sm Pulmonary/Chest: bil rhonchi Abdominal: Soft. Bowel sounds are normal. He exhibits no distension. There is no tenderness.  Lymphadenopathy: He has no cervical adenopathy.  Neurological: He is alert and oriented to person, place, and time.  Skin: Skin is warm and dry. No rash noted. No erythema.  Psychiatric: He has a normal mood and affect. His behavior is normal.  Ext 1+ edema bil le   Lab Results Recent Labs    04/24/18 0404 04/25/18 0525  WBC  --  5.9  HGB  --  10.2*  HCT  --  30.9*  NA 136 134*  K 3.8 3.7  CL 100*  99*  CO2 30 30  BUN 15 13  CREATININE 1.12 1.00    Microbiology: Results for orders placed or performed during the hospital encounter of 04/22/18  Blood Culture (routine x 2)     Status: None (Preliminary result)   Collection Time: 04/22/18  2:54 AM  Result Value Ref Range Status   Specimen Description BLOOD BLOOD RIGHT HAND  Final   Special Requests   Final    BOTTLES DRAWN AEROBIC AND ANAEROBIC Blood Culture adequate volume   Culture   Final    NO GROWTH 4 DAYS Performed at [facility], [address] [place], [place] [address]    Report Status PENDING  Incomplete  Blood Culture (routine x 2)     Status: Abnormal   Collection Time: 04/22/18  2:54 AM  Result Value Ref Range Status   Specimen Description   Final    BLOOD RIGHT ANTECUBITAL Performed at [facility], [address] [place], [place] [address]    Special Requests   Final    BOTTLES DRAWN AEROBIC AND ANAEROBIC Blood Culture adequate volume Performed at [facility], [address] [place], [place] [address]    Culture  Setup Time   Final    ANAEROBIC BOTTLE ONLY GRAM POSITIVE COCCI IN CLUSTERS CRITICAL RESULT CALLED TO, READ BACK BY AND VERIFIED WITH: Alvo 04/22/18.PMH Performed at [facility], [place] [address], [place],  [address]    Culture METHICILLIN RESISTANT STAPHYLOCOCCUS AUREUS (A)  Final   Report Status 04/25/2018 FINAL  Final   Organism ID, Bacteria METHICILLIN RESISTANT STAPHYLOCOCCUS AUREUS  Final      Susceptibility   Methicillin resistant staphylococcus aureus - MIC*    CIPROFLOXACIN >=8 RESISTANT Resistant     ERYTHROMYCIN >=8 RESISTANT Resistant     GENTAMICIN <=0.5 SENSITIVE Sensitive     OXACILLIN RESISTANT Resistant     TETRACYCLINE <=1 SENSITIVE Sensitive     VANCOMYCIN <=0.5 SENSITIVE Sensitive     TRIMETH/SULFA <=10 SENSITIVE Sensitive     CLINDAMYCIN <=0.25 SENSITIVE Sensitive     RIFAMPIN <=0.5 SENSITIVE Sensitive      Inducible Clindamycin NEGATIVE Sensitive     * METHICILLIN RESISTANT STAPHYLOCOCCUS AUREUS  Blood Culture ID Panel (Reflexed)     Status: Abnormal   Collection Time: 04/22/18  2:54 AM  Result Value Ref Range Status   Enterococcus species NOT DETECTED NOT DETECTED Final   Listeria monocytogenes  NOT DETECTED NOT DETECTED Final   Staphylococcus species DETECTED (A) NOT DETECTED Final    Comment: CRITICAL RESULT CALLED TO, READ BACK BY AND VERIFIED WITH: GARRETT COFFEE AT 1956 04/22/18.PMH    Staphylococcus aureus DETECTED (A) NOT DETECTED Final    Comment: Methicillin (oxacillin)-resistant Staphylococcus aureus (MRSA). MRSA is predictably resistant to beta-lactam antibiotics (except ceftaroline). Preferred therapy is vancomycin unless clinically contraindicated. Patient requires contact precautions if  hospitalized. CRITICAL RESULT CALLED TO, READ BACK BY AND VERIFIED WITH: GARRETT COFFEE AT 1956 04/22/18.PMH    Methicillin resistance DETECTED (A) NOT DETECTED Final    Comment: CRITICAL RESULT CALLED TO, READ BACK BY AND VERIFIED WITH: GARRETT COFFEE AT 1956 04/22/18.PMH    Streptococcus species NOT DETECTED NOT DETECTED Final   Streptococcus agalactiae NOT DETECTED NOT DETECTED Final   Streptococcus pneumoniae NOT DETECTED NOT DETECTED Final   Streptococcus pyogenes NOT DETECTED NOT DETECTED Final   Acinetobacter baumannii NOT DETECTED NOT DETECTED Final   Enterobacteriaceae species NOT DETECTED NOT DETECTED Final   Enterobacter cloacae complex NOT DETECTED NOT DETECTED Final   Escherichia coli NOT DETECTED NOT DETECTED Final   Klebsiella oxytoca NOT DETECTED NOT DETECTED Final   Klebsiella pneumoniae NOT DETECTED NOT DETECTED Final   Proteus species NOT DETECTED NOT DETECTED Final   Serratia marcescens NOT DETECTED NOT DETECTED Final   Haemophilus influenzae NOT DETECTED NOT DETECTED Final   Neisseria meningitidis NOT DETECTED NOT DETECTED Final   Pseudomonas aeruginosa NOT DETECTED  NOT DETECTED Final   Candida albicans NOT DETECTED NOT DETECTED Final   Candida glabrata NOT DETECTED NOT DETECTED Final   Candida krusei NOT DETECTED NOT DETECTED Final   Candida parapsilosis NOT DETECTED NOT DETECTED Final   Candida tropicalis NOT DETECTED NOT DETECTED Final    Comment: Performed at [facility], [address] [place], [place] [address]  Urine culture     Status: None   Collection Time: 04/22/18  2:55 AM  Result Value Ref Range Status   Specimen Description   Final    URINE, RANDOM Performed at [facility], [address], [place], [place] [address]    Special Requests   Final    NONE Performed at [facility], [address] [place], [place] [address]    Culture   Final    NO GROWTH Performed at [facility], [place] [address] [place], [place] [address]    Report Status 04/23/2018 FINAL  Final  MRSA PCR Screening     Status: None   Collection Time: 04/22/18  5:53 AM  Result Value Ref Range Status   MRSA by PCR NEGATIVE NEGATIVE Final    Comment:        The GeneXpert MRSA Assay (FDA approved for NASAL specimens only), is one component of a comprehensive MRSA colonization surveillance program. It is not intended to diagnose MRSA infection nor to guide or monitor treatment for MRSA infections. Performed at [facility], [address] [place], [place] [address]   Culture, blood (Routine X 2) w Reflex to ID Panel     Status: None (Preliminary result)   Collection Time: 04/22/18 11:33 PM  Result Value Ref Range Status   Specimen Description BLOOD LEFT ANTECUBITAL  Final   Special Requests   Final    BOTTLES DRAWN AEROBIC AND ANAEROBIC Blood Culture adequate volume   Culture   Final    NO GROWTH 4 DAYS Performed at [facility], [place], [place]  [address]    Report Status PENDING  Incomplete  Culture, blood (Routine X 2) w Reflex to ID Panel     Status: None (Preliminary result)    Collection Time: 04/22/18 11:43 PM  Result Value Ref Range Status   Specimen Description BLOOD LEFT HAND  Final   Special Requests   Final    BOTTLES DRAWN AEROBIC AND ANAEROBIC Blood Culture results may not be optimal due to an excessive volume of blood received in culture bottles   Culture   Final    NO GROWTH 4 DAYS Performed at [facility], [address] [place], [place] [address]    Report Status PENDING  Incomplete  CULTURE, BLOOD (ROUTINE X 2) w Reflex to ID Panel     Status: None (Preliminary result)   Collection Time: 04/24/18  4:04 AM  Result Value Ref Range Status   Specimen Description BLOOD RIGHT ANTECUBITAL  Final   Special Requests   Final    BOTTLES DRAWN AEROBIC AND ANAEROBIC Blood Culture adequate volume   Culture   Final    NO GROWTH 2 DAYS Performed at [facility], [address], [place], [place] [address]    Report Status PENDING  Incomplete  CULTURE, BLOOD (ROUTINE X 2) w Reflex to ID Panel     Status: None (Preliminary result)   Collection Time: 04/24/18  4:06 AM  Result Value Ref Range Status   Specimen Description BLOOD LEFT HAND  Final   Special Requests   Final    BOTTLES DRAWN AEROBIC AND ANAEROBIC Blood Culture adequate volume   Culture   Final    NO GROWTH 2 DAYS Performed at [facility], [address] [place], [place] [address]    Report Status PENDING  Incomplete    Studies/Results: No results found.  Assessment/Plan: DAILEY BUCCHERI is a 67 y.o. 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   He is also having some L sided post chest wall pain and some slightorsening of his chronic lbp.   CXR with possible R base infiltrate but unclear.  FU bcx done 4/29. TTE was poor study but no evidence of vegetation.  I  do not think it likely he has aspiration PNA at this time but may have become bacteremic from his recent finger infection. 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  Will need full wu for endocarditis, PICC placement when stable and min 4 weeks IV abx. 4/30 - had fever yest, fu bcx pending. Seen by cards. 04/26/18 - could not get TEE due to hypoxia. No fevers. Fu bcx remain neg  Recommendations Continue vancomycin  Repeat bcx 4/29 is NGTD - Place picc.  When he improves from respiratory viewpoint would perform TEE - has been seen by cards/ Currently on 2 L O2- not on O2 at home. Discussed with patient and wife need for several weeks IV abx given severity of MRSA bacteremia and risks associated with it.  Thank you very much for the consult. Will follow with you.  Leonel Ramsay   04/26/2018, 3:07 PM

## 2018-04-26 NOTE — Progress Notes (Signed)
PT Cancellation Note  Patient Details Name: Jerry Parsons MRN: [id number] DOB: 09-Jun-1951   Cancelled Treatment:    Reason Eval/Treat Not Completed: Patient at procedure or test/unavailable.  Per RN pt leaving for TEE.  Will attempt to see pt again later today, schedule permitting.    Collie Siad PT, DPT 04/26/2018, 11:22 AM

## 2018-04-26 NOTE — Progress Notes (Signed)
Progress Note  Patient Name: Jerry Parsons Date of Encounter: 04/26/2018  Primary Cardiologist: Jerry Parsons  Subjective   Feels well this morning. No SOB. On 2 L supplemental oxygen via nasal cannula. Blood cx NGTD x 2 from 4/29. T max of 99.4 over the past 24 hours.   Inpatient Medications    Scheduled Meds: . amiodarone  200 mg Oral Daily  . aspirin EC  81 mg Oral Daily  . atorvastatin  40 mg Oral q1800  . chlorhexidine  15 mL Mouth Rinse BID  . cyclobenzaprine  10 mg Oral BID  . dutasteride  0.5 mg Oral Daily  . enoxaparin (LOVENOX) injection  40 mg Subcutaneous Q24H  . famotidine  20 mg Oral Daily  . furosemide  40 mg Oral Daily  . hydrochlorothiazide  25 mg Oral QHS  . insulin aspart  0-20 Units Subcutaneous TID WC  . insulin aspart  0-5 Units Subcutaneous QHS  . insulin aspart  14 Units Subcutaneous TID WC  . insulin glargine  30 Units Subcutaneous BID  . ipratropium-albuterol  3 mL Nebulization Q6H  . mouth rinse  15 mL Mouth Rinse BID  . metoprolol tartrate  100 mg Oral BID  . multivitamin with minerals  1 tablet Oral Daily  . oxyCODONE  40 mg Oral Q8H  . pantoprazole  40 mg Oral Daily  . pregabalin  200 mg Oral BID  . pregabalin  300 mg Oral QHS  . sucralfate  1 g Oral TID WC & HS   Continuous Infusions: . ampicillin-sulbactam (UNASYN) IV Stopped (04/26/18 0405)  . vancomycin Stopped (04/26/18 0252)   PRN Meds: acetaminophen, bisacodyl, ondansetron (ZOFRAN) IV, oxyCODONE **OR** oxyCODONE, senna-docusate   Vital Signs    Vitals:   04/26/18 0118 04/26/18 0531 04/26/18 0748 04/26/18 0812  BP:  132/68    Pulse:  95    Resp:  16    Temp:  99.1 F (37.3 C)    TempSrc:  Oral    SpO2: 96% 93% 90% (!) 88%  Weight:      Height:        Intake/Output Summary (Last 24 hours) at 04/26/2018 0912 Last data filed at 04/26/2018 0534 Gross per 24 hour  Intake 1400 ml  Output 2450 ml  Net -1050 ml   Filed Weights   04/22/18 0535  Weight: 276 lb 7.3 oz (125.4 kg)      Telemetry    Not on telemetry - Personally Reviewed  ECG    n/a - Personally Reviewed  Physical Exam   GEN: No acute distress.   Neck: No JVD. Cardiac: RRR, no murmurs, rubs, or gallops.  Respiratory: Clear to auscultation bilaterally.  GI: Soft, nontender, non-distended.   MS: No edema; No deformity. Neuro:  Alert and oriented x 3; Nonfocal.  Psych: Normal affect.  Labs    Chemistry Recent Labs  Lab 04/22/18 0254 04/23/18 0721 04/24/18 0404 04/25/18 0525  NA 136 138 136 134*  K 3.6 3.5 3.8 3.7  CL 98* 105 100* 99*  CO2 29 29 30 30   GLUCOSE 359* 144* 191* 222*  BUN 26* 16 15 13   CREATININE 1.42* 0.99 1.12 1.00  CALCIUM 9.5 8.5* 9.1 9.0  PROT 7.3  --   --   --   ALBUMIN 3.9  --   --   --   AST 45*  --   --   --   ALT 35  --   --   --  ALKPHOS 110  --   --   --   BILITOT 0.6  --   --   --   GFRNONAA 49* >60 >60 >60  GFRAA 57* >60 >60 >60  ANIONGAP 9 4* 6 5     Hematology Recent Labs  Lab 04/22/18 0254 04/23/18 0721 04/24/18 0404 04/25/18 0525  WBC 13.5* 8.1  --  5.9  RBC 5.25 4.08*  --  4.08*  HGB 12.9* 10.1*  --  10.2*  HCT 39.7* 31.5*  --  30.9*  MCV 75.6* 77.3*  --  75.7*  MCH 24.5* 24.7*  --  25.0*  MCHC 32.4 31.9*  --  33.0  RDW 17.9* 18.3*  --  18.3*  PLT 133* 86* 110* 108*    Cardiac Enzymes Recent Labs  Lab 04/22/18 0254 04/22/18 0641  TROPONINI <0.03 0.04*   No results for input(s): TROPIPOC in the last 168 hours.   BNP Recent Labs  Lab 04/22/18 0254  BNP 49.0     DDimer No results for input(s): DDIMER in the last 168 hours.   Radiology    No results found.  Cardiac Studies   TTE 04/24/2018: Study Conclusions  - Procedure narrative: Transthoracic echocardiography. Image   quality was suboptimal. The study was technically difficult, as a   result of poor acoustic windows and body habitus. - Left ventricle: The cavity size was normal. There was mild   concentric hypertrophy. Systolic function was normal. The    estimated ejection fraction was in the range of 55% to 60%. Left   ventricular diastolic function parameters were normal. - Left atrium: The atrium was mildly dilated. - Right atrium: The atrium was mildly dilated. - Pulmonary arteries: Systolic pressure could not be accurately   estimated.  Impressions:  - There was no evidence of a vegetation.  TEE pending.  Patient Profile     67 y.o. male with history of atrial fibrillation/flutter not on anticoagulation, hypertension, remote DVT, obstructive sleep apnea, and spinal stenosis status post multiple back surgeries, who is being seen today for the evaluation of possible TEE in the setting of RSA bacteremia.  Assessment & Plan    1. MRSA bacteremia 1/6 blood cultures has returned positive for MRSA.  Blood cultures from 4/29 NGTD x 2. T max 99.4 over the past 24 hours.  Transthoracic echocardiogram was technically difficult but did not show an obvious vegetation.  Under the circumstances, it has been felt to be reasonable to proceed with transesophageal echocardiogram for further evaluation and to exclude endocarditis.  Initially this was delayed for ~ 24 hours given concern for [name]'s continued hypoxia as well as history of esophageal dilation earlier this year.  There is no urgency to proceeding with TEE.  Oxygen has now been weaned to 2 L via nasal cannula with room air saturation of 88% and 2 L saturation of 92% per IM.  No symptoms of dyspnea.  Dr. Saunders Parsons has discussed with GI, who feels that there is no contraindication from a GI standpoint to proceed with TEE based on the EGD from January.  Continue antimicrobial therapy per internal medicine and the infectious disease service.  Plan for TEE later today with close monitoring of respiratory status, may need anesthesia assistance.   Elevated troponin Troponin noted to be minimally elevated at 0.04 shortly after presentation.  In the setting of bacteremia and lack of chest  pain, it is felt this represents supply-demand mismatch.  Twelve-lead EKG not acute.   No further  work-up unless objective signs or symptoms of myocardial ischemia develop.  Paroxymal atrial flutter No symptoms reported by the patient.  He is not on telemetry at this time, though has not had a recurrence for quite some time per Dr. Donivan Scull most recent clinic note.  Mr. Shellhammer remains on amiodarone without anticoagulation.  Given a CHADSVASC score of at least 3 (age, diabetes mellitus, and hypertension), anticoagulation is indicated.  However, in the setting of acute illness and anemia, this has been deferred to Mr. Brundage's primary cardiologist, Dr. Rockey Parsons.  Continue amiodarone.  Acute respiratory failure with hypoxia secondary to suspected aspiration pneumonia  Continue antimicrobial therapy per medicine and infectious diseases.  Agree with pulmonary consultation.  Wean oxygen, as tolerated.  Chronic diastolic heart failure Chronic appearing lower extremity edema noted on exam.  Some element of hypoxia could also be related to diastolic heart failure.  Admission weight on 4/27 up 14 pounds from most recent office weight in 05/2017.  No weight today.   Continue gentle diuresis.  May need to escalate furosemide if hypoxia persists or there is evidence of worsening fluid retention.  Instructive sleep apnea  Continue nocturnal CPAP.    For questions or updates, please contact [facility] Please consult [email or web address] for contact info under Cardiology/STEMI.    Signed, Christell Faith, PA-C [place] Pager: [phone] 04/26/2018, 9:12 AM

## 2018-04-27 ENCOUNTER — Encounter: Payer: Self-pay | Admitting: Internal Medicine

## 2018-04-27 LAB — BASIC METABOLIC PANEL
Anion gap: 7 (ref 5–15)
BUN: 14 mg/dL (ref 6–20)
CO2: 31 mmol/L (ref 22–32)
Calcium: 9.7 mg/dL (ref 8.9–10.3)
Chloride: 98 mmol/L — ABNORMAL LOW (ref 101–111)
Creatinine, Ser: 1.05 mg/dL (ref 0.61–1.24)
GFR calc Af Amer: >60 mL/min (ref >60)
GFR calc non Af Amer: >60 mL/min (ref >60)
Glucose, Bld: 223 mg/dL — ABNORMAL HIGH (ref 65–99)
Potassium: 3.9 mmol/L (ref 3.5–5.1)
Sodium: 136 mmol/L (ref 135–145)

## 2018-04-27 LAB — GLUCOSE, CAPILLARY
Glucose-Capillary: 200 mg/dL — ABNORMAL HIGH (ref 65–99)
Glucose-Capillary: 229 mg/dL — ABNORMAL HIGH (ref 65–99)
Glucose-Capillary: 352 mg/dL — ABNORMAL HIGH (ref 65–99)
Glucose-Capillary: 176 mg/dL — ABNORMAL HIGH (ref 65–99)

## 2018-04-27 LAB — CULTURE, BLOOD (ROUTINE X 2)
Culture: NO GROWTH
Culture: NO GROWTH
Culture: NO GROWTH
Special Requests: ADEQUATE
Special Requests: ADEQUATE

## 2018-04-27 MED ORDER — ENSURE ENLIVE PO LIQD
237.0000 mL | Freq: Two times a day (BID) | ORAL | Status: DC
  Administered 2018-04-27 – 2018-04-28 (×3): 237 mL via ORAL

## 2018-04-27 NOTE — Progress Notes (Signed)
Per Dr. Fletcher Anon Pt not to have TEE today. Notified Dr. Leslye Peer and per MD okay to resume previous diet.

## 2018-04-27 NOTE — Progress Notes (Signed)
Initial Nutrition Assessment  DOCUMENTATION CODES:   Obesity unspecified  INTERVENTION:   Ensure Enlive po BID, each supplement provides 350 kcal and 20 grams of protein  MVI daily  Bowel regimen per MD  Recommend check iron, ferritin, folate, B12, TIBC labs to r/o deficiency   NUTRITION DIAGNOSIS:   Increased nutrient needs related to acute illness(PNA with sepsis ) as evidenced by increased estimated needs.  GOAL:   Patient will meet greater than or equal to 90% of their needs  MONITOR:   PO intake, Supplement acceptance, Labs, Weight trends, I & O's  REASON FOR ASSESSMENT:   LOS    ASSESSMENT:   67 y/o male with h/o DM, anemia, esophageal stricture s/p dilation, aspiration PNA admitted for recurrent aspiration PNA PICC line 5/1  Met with pt in room today. Pt reports good appetite and oral intake at baseline. Pt currently eating 50% of meals and reports that he hates the hospital food; pt is unhappy that gravy is being put on all of his food. RD will discontinue gravy on meal trays. Pt does like strawberry Ensure; RD will order. RD will monitor blood glucoses and change supplements accordingly. Pt with h/o esophageal stricture and dilation on dysphagia 3 diet. Per chart, pt with 9lb(4%) wt loss in 2 months; this is not significant. No BM noted since 4/29; recommend bowel regimen per MD. Pt noted to have microcytic anemia; would recommend checking iron/anemia labs to r/o deficiency.    Medications reviewed and include: aspirin, lovenox, pepcid, lasix, insulin, MVI, oxycodone, protonix, carafate  Labs reviewed: Hgb 10.2(L), Hct 30.9(L), MCV 75.7(L), MCH 25.0(L), RDW 18.3(H) cbgs- 196, 230, 162, 200, 229 x 24hrs  NUTRITION - FOCUSED PHYSICAL EXAM:    Most Recent Value  Orbital Region  No depletion  Upper Arm Region  No depletion  Thoracic and Lumbar Region  No depletion  Buccal Region  No depletion  Temple Region  No depletion  Clavicle Bone Region  No depletion   Clavicle and Acromion Bone Region  No depletion  Scapular Bone Region  No depletion  Dorsal Hand  No depletion  Patellar Region  No depletion  Anterior Thigh Region  No depletion  Posterior Calf Region  No depletion  Edema (RD Assessment)  Mild  Hair  Reviewed  Eyes  Reviewed  Mouth  Reviewed  Skin  Reviewed  Nails  Reviewed     Diet Order:   Diet Order           DIET DYS 3 Room service appropriate? Yes; Fluid consistency: Thin  Diet effective now         EDUCATION NEEDS:   Education needs have been addressed  Skin:  Skin Assessment: Reviewed RN Assessment  Last BM:  4/29- type 4  Height:   Ht Readings from Last 1 Encounters:  04/26/18 5' 11"  (1.803 m)    Weight:   Wt Readings from Last 1 Encounters:  04/27/18 268 lb 8.3 oz (121.8 kg)    Ideal Body Weight:  78.2 kg  BMI:  Body mass index is 37.45 kg/m.  Estimated Nutritional Needs:   Kcal:  2200-2500kcal/day   Protein:  122-134g/day   Fluid:  >2L/day   Koleen Distance MS, RD, LDN Pager #[phone] After Hours Pager: [phone]

## 2018-04-27 NOTE — Progress Notes (Signed)
Patient ID: Jerry Parsons, male   DOB: [birth year], 67 y.o.   MRN: [id number]  [employer] [facility] PROGRESS NOTE  Jerry Parsons:[id number] DOB: 05-18-1951 DOA: 04/22/2018 PCP: Jerrol Banana., MD  HPI/Subjective: Patient feeling okay.  Does not really complain of shortness of breath.  Having problems coming off the oxygen.  This morning his restless legs were acting up.  Objective: Vitals:   04/27/18 1223 04/27/18 1433  BP: 133/66 120/67  Pulse: 68 72  Resp:    Temp: 98.8 F (37.1 C) 98 F (36.7 C)  SpO2: 93% 95%    Filed Weights   04/22/18 0535 04/26/18 1143 04/27/18 0544  Weight: 125.4 kg (276 lb 7.3 oz) 125.2 kg (276 lb) 121.8 kg (268 lb 8.3 oz)    ROS: Review of Systems  Constitutional: Negative for chills and fever.  Eyes: Negative for blurred vision.  Respiratory: Negative for cough and shortness of breath.   Cardiovascular: Negative for chest pain.  Gastrointestinal: Negative for abdominal pain, constipation, diarrhea, nausea and vomiting.  Genitourinary: Negative for dysuria.  Musculoskeletal: Negative for joint pain.  Neurological: Negative for dizziness and headaches.   Exam: Physical Exam  Constitutional: He is oriented to person, place, and time.  HENT:  Nose: No mucosal edema.  Mouth/Throat: No oropharyngeal exudate or posterior oropharyngeal edema.  Eyes: Pupils are equal, round, and reactive to light. Conjunctivae, EOM and lids are normal.  Neck: No JVD present. Carotid bruit is not present. No edema present. No thyroid mass and no thyromegaly present.  Cardiovascular: S1 normal and S2 normal. Exam reveals no gallop.  No murmur heard. Pulses:      Dorsalis pedis pulses are 2+ on the right side, and 2+ on the left side.  Respiratory: No respiratory distress. He has decreased breath sounds in the right lower field. He has no wheezes. He has rhonchi in the right lower field. He has no rales.  GI: Soft. Bowel sounds are normal. There is no  tenderness.  Musculoskeletal:       Right ankle: He exhibits swelling.       Left ankle: He exhibits swelling.  Lymphadenopathy:    He has no cervical adenopathy.  Neurological: He is alert and oriented to person, place, and time. No cranial nerve deficit.  Skin: Skin is warm. No rash noted. Nails show no clubbing.  Psychiatric: He has a normal mood and affect.      Data Reviewed: Basic Metabolic Panel: Recent Labs  Lab 04/22/18 0254 04/22/18 0641 04/23/18 0721 04/24/18 0404 04/25/18 0525 04/27/18 1130  NA 136  --  138 136 134* 136  K 3.6  --  3.5 3.8 3.7 3.9  CL 98*  --  105 100* 99* 98*  CO2 29  --  29 30 30 31   GLUCOSE 359*  --  144* 191* 222* 223*  BUN 26*  --  16 15 13 14   CREATININE 1.42*  --  0.99 1.12 1.00 1.05  CALCIUM 9.5  --  8.5* 9.1 9.0 9.7  MG  --  1.8  --  1.8  --   --   PHOS  --  2.6  --   --   --   --    Liver Function Tests: Recent Labs  Lab 04/22/18 0254  AST 45*  ALT 35  ALKPHOS 110  BILITOT 0.6  PROT 7.3  ALBUMIN 3.9   CBC: Recent Labs  Lab 04/22/18 0254 04/23/18 0721 04/24/18 0404 04/25/18 0525  WBC  13.5* 8.1  --  5.9  NEUTROABS 11.8*  --   --   --   HGB 12.9* 10.1*  --  10.2*  HCT 39.7* 31.5*  --  30.9*  MCV 75.6* 77.3*  --  75.7*  PLT 133* 86* 110* 108*   Cardiac Enzymes: Recent Labs  Lab 04/22/18 0254 04/22/18 0641  TROPONINI <0.03 0.04*   BNP (last 3 results) Recent Labs    04/22/18 0254  BNP 49.0     CBG: Recent Labs  Lab 04/26/18 1219 04/26/18 1719 04/26/18 2232 04/27/18 0742 04/27/18 1221  GLUCAP 196* 230* 162* 200* 229*    Recent Results (from the past 240 hour(s))  Blood Culture (routine x 2)     Status: None   Collection Time: 04/22/18  2:54 AM  Result Value Ref Range Status   Specimen Description BLOOD BLOOD RIGHT HAND  Final   Special Requests   Final    BOTTLES DRAWN AEROBIC AND ANAEROBIC Blood Culture adequate volume   Culture   Final    NO GROWTH 5 DAYS Performed at [facility], [address], [place], [place] [address]    Report Status 04/27/2018 FINAL  Final  Blood Culture (routine x 2)     Status: Abnormal   Collection Time: 04/22/18  2:54 AM  Result Value Ref Range Status   Specimen Description   Final    BLOOD RIGHT ANTECUBITAL Performed at [facility], [address] [place], [place] [address]    Special Requests   Final    BOTTLES DRAWN AEROBIC AND ANAEROBIC Blood Culture adequate volume Performed at [facility], [address] [place], [place] [address]    Culture  Setup Time   Final    ANAEROBIC BOTTLE ONLY GRAM POSITIVE COCCI IN CLUSTERS CRITICAL RESULT CALLED TO, READ BACK BY AND VERIFIED WITH: [place] 04/22/18.PMH Performed at [facility], [place] [address] [place], [place] [address]    Culture METHICILLIN RESISTANT STAPHYLOCOCCUS AUREUS (A)  Final   Report Status 04/25/2018 FINAL  Final   Organism ID, Bacteria METHICILLIN RESISTANT STAPHYLOCOCCUS AUREUS  Final      Susceptibility   Methicillin resistant staphylococcus aureus - MIC*    CIPROFLOXACIN >=8 RESISTANT Resistant     ERYTHROMYCIN >=8 RESISTANT Resistant     GENTAMICIN <=0.5 SENSITIVE Sensitive     OXACILLIN RESISTANT Resistant     TETRACYCLINE <=1 SENSITIVE Sensitive     VANCOMYCIN <=0.5 SENSITIVE Sensitive     TRIMETH/SULFA <=10 SENSITIVE Sensitive     CLINDAMYCIN <=0.25 SENSITIVE Sensitive     RIFAMPIN <=0.5 SENSITIVE Sensitive     Inducible Clindamycin NEGATIVE Sensitive     * METHICILLIN RESISTANT STAPHYLOCOCCUS AUREUS  Blood Culture ID Panel (Reflexed)     Status: Abnormal   Collection Time: 04/22/18  2:54 AM  Result Value Ref Range Status   Enterococcus species NOT DETECTED NOT DETECTED Final   Listeria monocytogenes NOT DETECTED NOT DETECTED Final   Staphylococcus species DETECTED (A) NOT DETECTED Final    Comment: CRITICAL RESULT CALLED TO, READ BACK BY AND VERIFIED WITH: GARRETT COFFEE AT 1956 04/22/18.PMH     Staphylococcus aureus DETECTED (A) NOT DETECTED Final    Comment: Methicillin (oxacillin)-resistant Staphylococcus aureus (MRSA). MRSA is predictably resistant to beta-lactam antibiotics (except ceftaroline). Preferred therapy is vancomycin unless clinically contraindicated. Patient requires contact precautions if  hospitalized. CRITICAL RESULT CALLED TO, READ BACK BY AND VERIFIED WITH: GARRETT COFFEE AT 1956 04/22/18.PMH  Methicillin resistance DETECTED (A) NOT DETECTED Final    Comment: CRITICAL RESULT CALLED TO, READ BACK BY AND VERIFIED WITH: GARRETT COFFEE AT 1956 04/22/18.PMH    Streptococcus species NOT DETECTED NOT DETECTED Final   Streptococcus agalactiae NOT DETECTED NOT DETECTED Final   Streptococcus pneumoniae NOT DETECTED NOT DETECTED Final   Streptococcus pyogenes NOT DETECTED NOT DETECTED Final   Acinetobacter baumannii NOT DETECTED NOT DETECTED Final   Enterobacteriaceae species NOT DETECTED NOT DETECTED Final   Enterobacter cloacae complex NOT DETECTED NOT DETECTED Final   Escherichia coli NOT DETECTED NOT DETECTED Final   Klebsiella oxytoca NOT DETECTED NOT DETECTED Final   Klebsiella pneumoniae NOT DETECTED NOT DETECTED Final   Proteus species NOT DETECTED NOT DETECTED Final   Serratia marcescens NOT DETECTED NOT DETECTED Final   Haemophilus influenzae NOT DETECTED NOT DETECTED Final   Neisseria meningitidis NOT DETECTED NOT DETECTED Final   Pseudomonas aeruginosa NOT DETECTED NOT DETECTED Final   Candida albicans NOT DETECTED NOT DETECTED Final   Candida glabrata NOT DETECTED NOT DETECTED Final   Candida krusei NOT DETECTED NOT DETECTED Final   Candida parapsilosis NOT DETECTED NOT DETECTED Final   Candida tropicalis NOT DETECTED NOT DETECTED Final    Comment: Performed at [facility], [address], [place], [place] [address]  Urine culture     Status: None   Collection Time: 04/22/18  2:55 AM  Result Value Ref Range Status   Specimen Description    Final    URINE, RANDOM Performed at [facility], [address], [place], [place] [address]    Special Requests   Final    NONE Performed at [facility], [address], [place], [place] [address]    Culture   Final    NO GROWTH Performed at [facility], [place] [address], [place], [place] [address]    Report Status 04/23/2018 FINAL  Final  MRSA PCR Screening     Status: None   Collection Time: 04/22/18  5:53 AM  Result Value Ref Range Status   MRSA by PCR NEGATIVE NEGATIVE Final    Comment:        The GeneXpert MRSA Assay (FDA approved for NASAL specimens only), is one component of a comprehensive MRSA colonization surveillance program. It is not intended to diagnose MRSA infection nor to guide or monitor treatment for MRSA infections. Performed at [facility], [address] [place], [place] [address]   Culture, blood (Routine X 2) w Reflex to ID Panel     Status: None   Collection Time: 04/22/18 11:33 PM  Result Value Ref Range Status   Specimen Description BLOOD LEFT ANTECUBITAL  Final   Special Requests   Final    BOTTLES DRAWN AEROBIC AND ANAEROBIC Blood Culture adequate volume   Culture   Final    NO GROWTH 5 DAYS Performed at [facility], [address] [place], [place] [address]    Report Status 04/27/2018 FINAL  Final  Culture, blood (Routine X 2) w Reflex to ID Panel     Status: None   Collection Time: 04/22/18 11:43 PM  Result Value Ref Range Status   Specimen Description BLOOD LEFT HAND  Final   Special Requests   Final    BOTTLES DRAWN AEROBIC AND ANAEROBIC Blood Culture results may not be optimal due to an excessive volume of blood received in culture bottles   Culture   Final    NO GROWTH 5 DAYS  Performed at [facility], [address] [place], [place] [address]    Report Status 04/27/2018 FINAL  Final  CULTURE, BLOOD (ROUTINE X 2) w Reflex to ID Panel     Status: None (Preliminary result)    Collection Time: 04/24/18  4:04 AM  Result Value Ref Range Status   Specimen Description BLOOD RIGHT ANTECUBITAL  Final   Special Requests   Final    BOTTLES DRAWN AEROBIC AND ANAEROBIC Blood Culture adequate volume   Culture   Final    NO GROWTH 3 DAYS Performed at [facility], [address] [place], [place] [address]    Report Status PENDING  Incomplete  CULTURE, BLOOD (ROUTINE X 2) w Reflex to ID Panel     Status: None (Preliminary result)   Collection Time: 04/24/18  4:06 AM  Result Value Ref Range Status   Specimen Description BLOOD LEFT HAND  Final   Special Requests   Final    BOTTLES DRAWN AEROBIC AND ANAEROBIC Blood Culture adequate volume   Culture   Final    NO GROWTH 3 DAYS Performed at [facility], [address], [place], [place] [address]    Report Status PENDING  Incomplete     Scheduled Meds: . amiodarone  200 mg Oral Daily  . aspirin EC  81 mg Oral Daily  . atorvastatin  40 mg Oral q1800  . chlorhexidine  15 mL Mouth Rinse BID  . cyclobenzaprine  10 mg Oral BID  . dutasteride  0.5 mg Oral Daily  . enoxaparin (LOVENOX) injection  40 mg Subcutaneous Q24H  . famotidine  20 mg Oral Daily  . feeding supplement (ENSURE ENLIVE)  237 mL Oral BID BM  . furosemide  40 mg Oral BID  . hydrochlorothiazide  25 mg Oral QHS  . insulin aspart  0-20 Units Subcutaneous TID WC  . insulin aspart  0-5 Units Subcutaneous QHS  . insulin aspart  14 Units Subcutaneous TID WC  . insulin glargine  30 Units Subcutaneous BID  . ipratropium-albuterol  3 mL Nebulization Q6H  . mouth rinse  15 mL Mouth Rinse BID  . metoprolol tartrate  100 mg Oral BID  . multivitamin with minerals  1 tablet Oral Daily  . oxyCODONE  40 mg Oral Q8H  . pantoprazole  40 mg Oral Daily  . pregabalin  200 mg Oral BID  . pregabalin  300 mg Oral QHS  . sodium chloride flush  10-40 mL Intracatheter Q12H  . sucralfate  1 g Oral TID WC & HS   Continuous Infusions: . vancomycin  Stopped (04/27/18 1511)    Assessment/Plan:  1. Clinical sepsis present on admission secondary to pneumonia.  Lactic acidosis.  One blood culture growing MRSA.  Likely will need 4 weeks of IV vancomycin.  Repeat blood cultures are negative.  TEE probably will not be able to be done secondary to his sleep apnea.  PICC line today.  2. Chronic hypoxic respiratory failure.  Patient on 2 L of oxygen.  pulse ox 84% on room air.  Chronic microaspiration and chronic obesity hypoventilation syndrome contributing to the patient's chronic hypoxic respiratory failure.  3. History of sleep apnea on CPAP at night 4. History of esophageal stricture status post dilation last endoscopy 5. Type 2 diabetes mellitus on glargine insulin and sliding scale 6. Obesity with a BMI of 38.56 7. Essential hypertension on hydrochlorothiazide, metoprolol 8. Restless leg syndrome on Lyrica 9. Hyperlipidemia unspecified on atorvastatin 10. History of  arrhythmia on amiodarone and metoprolol 11. Chronic pain on high-dose pain medications.  Code Status:     Code Status Orders  (From admission, onward)        Start     Ordered   04/22/18 0536  Full code  Continuous     04/22/18 0535    Code Status History    Date Active Date Inactive Code Status Order ID Comments User Context   06/20/2017 1035 06/22/2017 1801 Full Code [id number]  Gaynelle Arabian, MD Inpatient   05/08/2017 0002 05/10/2017 1512 Full Code [id number]  Harvie Bridge, DO Inpatient   09/08/2015 2107 09/09/2015 1804 Full Code [id number]  Demetrios Loll, MD Inpatient    Advance Directive Documentation     Most Recent Value  Type of Advance Directive  Healthcare Power of Attorney  Pre-existing out of facility DNR order (yellow form or pink MOST form)  -  "MOST" Form in Place?  -     Family Communication: Wife at bedside. Disposition Plan:   Case discussed with Dr. Ola Spurr to set up for IV vancomycin for potential discharge  tomorrow.  Consultants:  infectious disease  Cardiology  Antibiotics:  IV vancomycin  Time spent: 25 minutes  Credit

## 2018-04-27 NOTE — Progress Notes (Signed)
SATURATION QUALIFICATIONS: (This note is used to comply with regulatory documentation for home oxygen)  Patient Saturations on Room Air at Rest = 84%  Patient Saturations on 2 Liters of oxygen while at rest  = 95%  Please briefly explain why patient needs home oxygen:

## 2018-04-27 NOTE — Progress Notes (Addendum)
Infectious Disease Long Term IV Antibiotic Orders DWAN HEMMELGARN 1951/02/17  Diagnosis: MRSA bacteremia, PNA TTE neg, but TEE not done yet- to be done as otpt  Culture results  Culture METHICILLIN RESISTANT STAPHYLOCOCCUS AUREUSAbnormal    Report Status 04/25/2018 FINAL   Organism ID, Bacteria METHICILLIN RESISTANT STAPHYLOCOCCUS AUREUS  Susceptibility   Methicillin resistant staphylococcus aureus (ZZ00)   Antibiotic Interpretation Microscan Method Status  CIPROFLOXACIN Resistant >=8 RESISTANT MIC Final  ERYTHROMYCIN Resistant >=8 RESISTANT MIC Final  GENTAMICIN Sensitive <=0.5 SENSITIVE MIC Final  OXACILLIN Resistant RESISTANT MIC Final  TETRACYCLINE Sensitive <=1 SENSITIVE MIC Final  VANCOMYCIN Sensitive <=0.5 SENSITIVE MIC Final  TRIMETH/SULFA Sensitive <=10 SENSITIVE MIC Final  CLINDAMYCIN Sensitive <=0.25 SENSITIVE MIC Final  RIFAMPIN Sensitive <=0.5 SENSITIVE MIC Final  Inducible Clindamycin Sensitive NEGATIVE MIC Final  Susceptibility Comments   METHICILLIN RESISTANT STAPHYLOCOCCUS AUREUS       LABS Lab Results  Component Value Date   CREATININE 1.05 04/27/2018   Lab Results  Component Value Date   WBC 5.9 04/25/2018   HGB 10.2 (L) 04/25/2018   HCT 30.9 (L) 04/25/2018   MCV 75.7 (L) 04/25/2018   PLT 108 (L) 04/25/2018   Lab Results  Component Value Date   ESRSEDRATE 57 (H) 04/23/2018   Lab Results  Component Value Date   CRP 16.2 (H) 04/23/2018    Allergies:  Allergies  Allergen Reactions  . Carisoprodol Itching    Discharge antibiotics Vancomycin                 1500 mg  every   12            hours .     Goal vancomycin trough 15-20.    Pharmacy to adjust dosing based on levels   PICC Care per protocol Labs weekly while on IV antibiotics -FAX weekly labs to [phone] CBC w diff   cr Vancomycin Trough   CRP   Planned duration of antibiotics 4 weeks tentative-   Stop date 05/21/18 Follow up clinic date 3 weeks    Leonel Ramsay,  MD

## 2018-04-27 NOTE — Progress Notes (Signed)
[place] INFECTIOUS DISEASE PROGRESS NOTE Date of Admission:  04/22/2018     ID: Jerry Parsons is a 67 y.o. male with MRSA bacteremia Active Problems:   Sepsis due to pneumonia ([place])  Subjective: No fevers, could not get TEE again as still on O2. Denies pain  ROS  Eleven systems are reviewed and negative except per hpi  Medications:  Antibiotics Given (last 72 hours)    Date/Time Action Medication Dose Rate   04/24/18 1616 New Bag/Given   Ampicillin-Sulbactam (UNASYN) 3 g in sodium chloride 0.9 % 100 mL IVPB 3 g 200 mL/hr   04/25/18 0008 New Bag/Given   Ampicillin-Sulbactam (UNASYN) 3 g in sodium chloride 0.9 % 100 mL IVPB 3 g 200 mL/hr   04/25/18 0059 New Bag/Given   vancomycin (VANCOCIN) 1,500 mg in sodium chloride 0.9 % 500 mL IVPB 1,500 mg 250 mL/hr   04/25/18 0535 New Bag/Given   Ampicillin-Sulbactam (UNASYN) 3 g in sodium chloride 0.9 % 100 mL IVPB 3 g 200 mL/hr   04/25/18 0933 New Bag/Given   Ampicillin-Sulbactam (UNASYN) 3 g in sodium chloride 0.9 % 100 mL IVPB 3 g 200 mL/hr   04/25/18 1308 New Bag/Given   vancomycin (VANCOCIN) 1,500 mg in sodium chloride 0.9 % 500 mL IVPB 1,500 mg 250 mL/hr   04/25/18 1734 New Bag/Given   Ampicillin-Sulbactam (UNASYN) 3 g in sodium chloride 0.9 % 100 mL IVPB 3 g 200 mL/hr   04/25/18 2226 New Bag/Given   Ampicillin-Sulbactam (UNASYN) 3 g in sodium chloride 0.9 % 100 mL IVPB 3 g 200 mL/hr   04/26/18 0052 New Bag/Given   vancomycin (VANCOCIN) 1,500 mg in sodium chloride 0.9 % 500 mL IVPB 1,500 mg 250 mL/hr   04/26/18 0335 New Bag/Given   Ampicillin-Sulbactam (UNASYN) 3 g in sodium chloride 0.9 % 100 mL IVPB 3 g 200 mL/hr   04/26/18 1735 New Bag/Given  [NP IV access at time due]   vancomycin (VANCOCIN) 1,500 mg in sodium chloride 0.9 % 500 mL IVPB 1,500 mg 250 mL/hr   04/27/18 0022 New Bag/Given   vancomycin (VANCOCIN) 1,500 mg in sodium chloride 0.9 % 500 mL IVPB 1,500 mg 250 mL/hr   04/27/18 1252 New Bag/Given   vancomycin  (VANCOCIN) 1,500 mg in sodium chloride 0.9 % 500 mL IVPB 1,500 mg 250 mL/hr     . amiodarone  200 mg Oral Daily  . aspirin EC  81 mg Oral Daily  . atorvastatin  40 mg Oral q1800  . chlorhexidine  15 mL Mouth Rinse BID  . cyclobenzaprine  10 mg Oral BID  . dutasteride  0.5 mg Oral Daily  . enoxaparin (LOVENOX) injection  40 mg Subcutaneous Q24H  . famotidine  20 mg Oral Daily  . feeding supplement (ENSURE ENLIVE)  237 mL Oral BID BM  . furosemide  40 mg Oral BID  . hydrochlorothiazide  25 mg Oral QHS  . insulin aspart  0-20 Units Subcutaneous TID WC  . insulin aspart  0-5 Units Subcutaneous QHS  . insulin aspart  14 Units Subcutaneous TID WC  . insulin glargine  30 Units Subcutaneous BID  . ipratropium-albuterol  3 mL Nebulization Q6H  . mouth rinse  15 mL Mouth Rinse BID  . metoprolol tartrate  100 mg Oral BID  . multivitamin with minerals  1 tablet Oral Daily  . oxyCODONE  40 mg Oral Q8H  . pantoprazole  40 mg Oral Daily  . pregabalin  200 mg Oral BID  . pregabalin  300 mg Oral QHS  . sodium chloride flush  10-40 mL Intracatheter Q12H  . sucralfate  1 g Oral TID WC & HS    Objective: Vital signs in last 24 hours: Temp:  [98 F (36.7 C)-98.8 F (37.1 C)] 98 F (36.7 C) (05/02 1433) Pulse Rate:  [68-82] 72 (05/02 1433) Resp:  [20] 20 (05/02 0544) BP: (116-144)/(61-83) 120/67 (05/02 1433) SpO2:  [92 %-95 %] 95 % (05/02 1433) Weight:  [121.8 kg (268 lb 8.3 oz)] 121.8 kg (268 lb 8.3 oz) (05/02 0544) Constitutional: He is oriented to person, place, and time. Obese. On 4L o2 HENT: anicteric Mouth/Throat: Oropharynx is clear and moist. No oropharyngeal exudate.  Cardiovascular: Normal rate, regular rhythm 2/6 sm Pulmonary/Chest: bil rhonchi Abdominal: Soft. Bowel sounds are normal. He exhibits no distension. There is no tenderness.  Lymphadenopathy: He has no cervical adenopathy.  Neurological: He is alert and oriented to person, place, and time.  Skin: Skin is warm and dry.  No rash noted. No erythema.  Psychiatric: He has a normal mood and affect. His behavior is normal.  Ext 1+ edema bil le   Lab Results Recent Labs    04/25/18 0525 04/27/18 1130  WBC 5.9  --   HGB 10.2*  --   HCT 30.9*  --   NA 134* 136  K 3.7 3.9  CL 99* 98*  CO2 30 31  BUN 13 14  CREATININE 1.00 1.05    Microbiology: Results for orders placed or performed during the hospital encounter of 04/22/18  Blood Culture (routine x 2)     Status: None   Collection Time: 04/22/18  2:54 AM  Result Value Ref Range Status   Specimen Description BLOOD BLOOD RIGHT HAND  Final   Special Requests   Final    BOTTLES DRAWN AEROBIC AND ANAEROBIC Blood Culture adequate volume   Culture   Final    NO GROWTH 5 DAYS Performed at [facility], [address], [place], [place] [address]    Report Status 04/27/2018 FINAL  Final  Blood Culture (routine x 2)     Status: Abnormal   Collection Time: 04/22/18  2:54 AM  Result Value Ref Range Status   Specimen Description   Final    BLOOD RIGHT ANTECUBITAL Performed at [facility], [address] [place], [place] [address]    Special Requests   Final    BOTTLES DRAWN AEROBIC AND ANAEROBIC Blood Culture adequate volume Performed at [facility], [address] [place], [place] [address]    Culture  Setup Time   Final    ANAEROBIC BOTTLE ONLY GRAM POSITIVE COCCI IN CLUSTERS CRITICAL RESULT CALLED TO, READ BACK BY AND VERIFIED WITH: [place] 04/22/18.PMH Performed at [facility], [place] [address] [place], [place] [address]    Culture METHICILLIN RESISTANT STAPHYLOCOCCUS AUREUS (A)  Final   Report Status 04/25/2018 FINAL  Final   Organism ID, Bacteria METHICILLIN RESISTANT STAPHYLOCOCCUS AUREUS  Final      Susceptibility   Methicillin resistant staphylococcus aureus - MIC*    CIPROFLOXACIN >=8 RESISTANT Resistant     ERYTHROMYCIN >=8 RESISTANT Resistant     GENTAMICIN <=0.5 SENSITIVE Sensitive      OXACILLIN RESISTANT Resistant     TETRACYCLINE <=1 SENSITIVE Sensitive     VANCOMYCIN <=0.5 SENSITIVE Sensitive     TRIMETH/SULFA <=10 SENSITIVE Sensitive     CLINDAMYCIN <=0.25 SENSITIVE Sensitive     RIFAMPIN <=0.5 SENSITIVE Sensitive  Inducible Clindamycin NEGATIVE Sensitive     * METHICILLIN RESISTANT STAPHYLOCOCCUS AUREUS  Blood Culture ID Panel (Reflexed)     Status: Abnormal   Collection Time: 04/22/18  2:54 AM  Result Value Ref Range Status   Enterococcus species NOT DETECTED NOT DETECTED Final   Listeria monocytogenes NOT DETECTED NOT DETECTED Final   Staphylococcus species DETECTED (A) NOT DETECTED Final    Comment: CRITICAL RESULT CALLED TO, READ BACK BY AND VERIFIED WITH: GARRETT COFFEE AT 1956 04/22/18.PMH    Staphylococcus aureus DETECTED (A) NOT DETECTED Final    Comment: Methicillin (oxacillin)-resistant Staphylococcus aureus (MRSA). MRSA is predictably resistant to beta-lactam antibiotics (except ceftaroline). Preferred therapy is vancomycin unless clinically contraindicated. Patient requires contact precautions if  hospitalized. CRITICAL RESULT CALLED TO, READ BACK BY AND VERIFIED WITH: GARRETT COFFEE AT 1956 04/22/18.PMH    Methicillin resistance DETECTED (A) NOT DETECTED Final    Comment: CRITICAL RESULT CALLED TO, READ BACK BY AND VERIFIED WITH: GARRETT COFFEE AT 1956 04/22/18.PMH    Streptococcus species NOT DETECTED NOT DETECTED Final   Streptococcus agalactiae NOT DETECTED NOT DETECTED Final   Streptococcus pneumoniae NOT DETECTED NOT DETECTED Final   Streptococcus pyogenes NOT DETECTED NOT DETECTED Final   Acinetobacter baumannii NOT DETECTED NOT DETECTED Final   Enterobacteriaceae species NOT DETECTED NOT DETECTED Final   Enterobacter cloacae complex NOT DETECTED NOT DETECTED Final   Escherichia coli NOT DETECTED NOT DETECTED Final   Klebsiella oxytoca NOT DETECTED NOT DETECTED Final   Klebsiella pneumoniae NOT DETECTED NOT DETECTED Final   Proteus  species NOT DETECTED NOT DETECTED Final   Serratia marcescens NOT DETECTED NOT DETECTED Final   Haemophilus influenzae NOT DETECTED NOT DETECTED Final   Neisseria meningitidis NOT DETECTED NOT DETECTED Final   Pseudomonas aeruginosa NOT DETECTED NOT DETECTED Final   Candida albicans NOT DETECTED NOT DETECTED Final   Candida glabrata NOT DETECTED NOT DETECTED Final   Candida krusei NOT DETECTED NOT DETECTED Final   Candida parapsilosis NOT DETECTED NOT DETECTED Final   Candida tropicalis NOT DETECTED NOT DETECTED Final    Comment: Performed at [facility], [address], [place], [place] [address]  Urine culture     Status: None   Collection Time: 04/22/18  2:55 AM  Result Value Ref Range Status   Specimen Description   Final    URINE, RANDOM Performed at [facility], [address], [place], [place] [address]    Special Requests   Final    NONE Performed at [facility], [address] [place], [place] [address]    Culture   Final    NO GROWTH Performed at [facility], [place] [address], [place], [place] [address]    Report Status 04/23/2018 FINAL  Final  MRSA PCR Screening     Status: None   Collection Time: 04/22/18  5:53 AM  Result Value Ref Range Status   MRSA by PCR NEGATIVE NEGATIVE Final    Comment:        The GeneXpert MRSA Assay (FDA approved for NASAL specimens only), is one component of a comprehensive MRSA colonization surveillance program. It is not intended to diagnose MRSA infection nor to guide or monitor treatment for MRSA infections. Performed at [facility], [address] [place], [place] [address]   Culture, blood (Routine X 2) w Reflex to ID Panel     Status: None   Collection Time: 04/22/18 11:33 PM  Result Value Ref Range Status  Specimen Description BLOOD LEFT ANTECUBITAL  Final   Special Requests   Final    BOTTLES DRAWN AEROBIC AND ANAEROBIC Blood Culture adequate volume   Culture   Final     NO GROWTH 5 DAYS Performed at [facility], [address] [place], [place] [address]    Report Status 04/27/2018 FINAL  Final  Culture, blood (Routine X 2) w Reflex to ID Panel     Status: None   Collection Time: 04/22/18 11:43 PM  Result Value Ref Range Status   Specimen Description BLOOD LEFT HAND  Final   Special Requests   Final    BOTTLES DRAWN AEROBIC AND ANAEROBIC Blood Culture results may not be optimal due to an excessive volume of blood received in culture bottles   Culture   Final    NO GROWTH 5 DAYS Performed at [facility], [address] [place], [place] [address]    Report Status 04/27/2018 FINAL  Final  CULTURE, BLOOD (ROUTINE X 2) w Reflex to ID Panel     Status: None (Preliminary result)   Collection Time: 04/24/18  4:04 AM  Result Value Ref Range Status   Specimen Description BLOOD RIGHT ANTECUBITAL  Final   Special Requests   Final    BOTTLES DRAWN AEROBIC AND ANAEROBIC Blood Culture adequate volume   Culture   Final    NO GROWTH 3 DAYS Performed at [facility], [address] [place], [place] [address]    Report Status PENDING  Incomplete  CULTURE, BLOOD (ROUTINE X 2) w Reflex to ID Panel     Status: None (Preliminary result)   Collection Time: 04/24/18  4:06 AM  Result Value Ref Range Status   Specimen Description BLOOD LEFT HAND  Final   Special Requests   Final    BOTTLES DRAWN AEROBIC AND ANAEROBIC Blood Culture adequate volume   Culture   Final    NO GROWTH 3 DAYS Performed at [facility], [address] [place],  [address]    Report Status PENDING  Incomplete    Studies/Results: [place] Ekg Site Rite  Result Date: 04/26/2018 If Site Rite image not attached, placement could not be confirmed due to current cardiac rhythm.  [place] Ekg Site Rite  Result Date: 04/26/2018 If Site Rite image not attached, placement could not be confirmed due to current cardiac rhythm.   Assessment/Plan: Jerry Parsons  is a 67 y.o. 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   He is also having some L sided post chest wall pain and some slightorsening of his chronic lbp.   CXR with possible R base infiltrate but unclear.  FU bcx done 4/29. TTE was poor study but no evidence of vegetation.  I do not think it likely he has aspiration PNA at this time but may have become bacteremic from his recent finger infection. 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  Will need full wu for endocarditis, PICC placement when stable and min 4 weeks IV abx. 4/30 - had fever yest, fu bcx pending. Seen by cards. 04/26/18 - could not get TEE due to hypoxia. No fevers. Fu bcx remain neg 5/2 - no tee, still on O2. PICC placed.  Recommendations Continue vancomycin  When he improves from respiratory viewpoint would perform TEE - has been seen by cards - this can be arranged for early next week as outpatient if OK with cardiology  Currently on 2 L O2- not on O2 at home.  Discussed with patient and wife need for 2-6 weeks IV abx given severity of MRSA bacteremia and risks associated with it.   If TEE is negative will plan on 2 weeks IV with oral doxy tail coverage for 2 weeks.  If TEE + will do full 6 weeks.  I should see in clinic prior to stop date   Thank you very much for the consult. Will follow with you.  Leonel Ramsay   04/27/2018, 2:54 PM

## 2018-04-27 NOTE — Progress Notes (Addendum)
Speech Language Pathology Treatment: Dysphagia  Patient Details Name: Jerry Parsons MRN: [id number] DOB: 08/01/1951 Today's Date: 04/27/2018 Time: 1345-1430 SLP Time Calculation (min) (ACUTE ONLY): 45 min  Assessment / Plan / Recommendation Clinical Impression  Pt seen today for ongoing education w/ swallow strategies and aspiration precautions. Pt has been recommended to use a [employer] and f/u swallow when drinking thin liquids - and to only drink BY CUP.  Pt consumed 8 trials w/ 2 consecutive sips often w/ no decline in respiratory status, no overt coughing. He followed the recommendations maintaining a Chin Tuck position and using 2 swallows to fully clear any residue. Pt was cued to drink slowly and take small sips - he tended to drink quickly. Vocal quality was clear b/t trials when assessed.  Pt reported he felt he was tolerating his oral diet w/ no discomfort or decline in status during the meal. Discussed his risk for aspiration and the risk he may not always react to possible micro-aspiration secondary to the insensate nature of his dysphagia, laryngeal Penetration.  Recommended to continue the current diet w/ Chin Tuck strategy when swallowing liquids; general aspiration precautions to include small, single sips slowly. Explained to the pt he MUST sit fully upright w/ any oral intake. Recommended pt f/u w/ his PCP if he feels any chest congestion or cough as this could be early s/s of pneumonia from the possible aspiration. Pt gave verbal understanding and repeated back his swallowing strategies/precautions which are also posted on the wall beside him in the room. Discussed pt's baseline of GERD and recent Gastritis and how any Esophageal issues can impact the pharyngeal phase of swallowing as well as impact Pulmonary status if any penetration or aspiration occur; noted pt has had increased c/o phlegm/sputum and cough which could be related to his GI issues. Pt is on [place]. Pt gave verbal  agreement.  ST services will f/u tomorrow w/ ongoing education and toleration of diet. NSG updated. Addendum: discussed pt's chronic Esophageal phase issues and the dysphagia w/ MD and the impact is can have on the Pulmonary status - chronic in nature.     HPI HPI: Pt is a 67 y.o. 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 Pt is accompanied by his wife at bedside. Pt has a known Hx of aspiration, for which he is followed by his PCP (Dr. [name]). He has been hospitalized with pneumonia ~9x in the past ~5 years, and his wife tells me the present admission is his tenth such admission. On his last admission, he was evaluated and managed by SLP, but the pt's wife tells me he did not receive any outpt SLP services after discharge - pt was NOT SEEN by this ST service during his last hospitalization which was in 04/2017(must have been seen at another facility). Pt was recently seen by GI services for an EDG in 01/16/2018 and dx'd w/ Gastritis; dilation per chart. Pt endorses 2d of cough, intermittently productive of clear/white sputum, as well as SOB, both progressively worsening. He endorses a 3-4d of progressively worsening back pain, as well as mild R-sided chest discomfort. Overnight (Friday 04/21/2018), pt's wife states she woke up at ~0200AM to find that the thermostat was turned to  82F, and the pt was wearing jeans and shoes under his pajamas. He endorsed chills, but denied fever. He appears diaphoretic, ill-appearing and restless at the time of my Hx/examination, but is in no acute respiratory distress on 100% NRB mask. He is alert, responds appropriately to questions and commands. Wife and pt unsure of the  dates they were seen by ST services(if seen by services?). Wife endorses significant period/years of GERD; on Nexium 2x daily for a PPI.       SLP Plan  Continue with current plan of care(education w/ family)       Recommendations  Diet recommendations: Dysphagia 3 (mechanical soft);Thin liquid Liquids provided via: Cup;No straw Medication Administration: Whole meds with puree Supervision: Patient able to self feed;Intermittent supervision to cue for compensatory strategies Compensations: Minimize environmental distractions;Slow rate;Small sips/bites;Lingual sweep for clearance of pocketing;Multiple dry swallows after each bite/sip;Follow solids with liquid Postural Changes and/or Swallow Maneuvers: Seated upright 90 degrees;Upright 30-60 min after meal                General recommendations: (Dietician f/u) Oral Care Recommendations: Oral care BID;Patient independent with oral care Follow up Recommendations: Home health SLP(TBD) SLP Visit Diagnosis: Dysphagia, oropharyngeal phase (R13.12);Dysphagia, pharyngoesophageal phase (R13.14)((Reflux)) Plan: Continue with current plan of care(education w/ family)       GO               Jerry Kenner, MS, CCC-SLP Signora Zucco 04/27/2018, 3:19 PM

## 2018-04-28 LAB — GLUCOSE, CAPILLARY
Glucose-Capillary: 239 mg/dL — ABNORMAL HIGH (ref 65–99)
Glucose-Capillary: 403 mg/dL — ABNORMAL HIGH (ref 65–99)
Glucose-Capillary: 230 mg/dL — ABNORMAL HIGH (ref 65–99)

## 2018-04-28 MED ORDER — VANCOMYCIN HCL 10 G IV SOLR
1500.0000 mg | Freq: Two times a day (BID) | INTRAVENOUS | Status: DC
  Administered 2018-04-28: 1500 mg via INTRAVENOUS
  Filled 2018-04-28 (×2): qty 1500

## 2018-04-28 MED ORDER — VANCOMYCIN HCL 10 G IV SOLR
1500.0000 mg | Freq: Two times a day (BID) | INTRAVENOUS | Status: DC
  Filled 2018-04-28 (×2): qty 1500

## 2018-04-28 MED ORDER — INSULIN ASPART 100 UNIT/ML FLEXPEN: 12.0000 [IU] | PEN_INJECTOR | Freq: Three times a day (TID) | SUBCUTANEOUS | Status: DC

## 2018-04-28 MED ORDER — ENSURE ENLIVE PO LIQD: 237.0000 mL | Freq: Two times a day (BID) | ORAL | 0 refills | Status: DC

## 2018-04-28 MED ORDER — VANCOMYCIN HCL 10 G IV SOLR: 1500.0000 mg | Freq: Two times a day (BID) | INTRAVENOUS | 0 refills | Status: DC

## 2018-04-28 MED ORDER — OXYCODONE HCL ER 40 MG PO T12A: 40.0000 mg | EXTENDED_RELEASE_TABLET | Freq: Three times a day (TID) | ORAL | Status: DC

## 2018-04-28 NOTE — Care Management Important Message (Signed)
Important Message  Patient Details  Name: KENG JEWEL MRN: [id number] Date of Birth: [birth year]   Medicare Important Message Given:  Yes    Beverly Sessions, RN 04/28/2018, 4:17 PM

## 2018-04-28 NOTE — Progress Notes (Signed)
Per Dr. Leslye Peer okay to place discharge order home at this time.

## 2018-04-28 NOTE — Progress Notes (Signed)
Pt ambulated in hall pulse ox dropped on room air to 83% with 2 L O2 came up to 90 or 91.

## 2018-04-28 NOTE — Discharge Summary (Signed)
[facility] NAME: Jerry Parsons    MR#:  [id number]  DATE OF BIRTH:  15-Sep-1951  DATE OF ADMISSION:  04/22/2018 ADMITTING PHYSICIAN: Amelia Jo, MD  DATE OF DISCHARGE: 04/28/2018  PRIMARY CARE PHYSICIAN: Jerrol Banana., MD    ADMISSION DIAGNOSIS:  Hyperglycemia [R73.9] Renal insufficiency [N28.9] Sepsis, due to unspecified organism ([place]) [A41.9] Fever, unspecified fever cause [R50.9] Community acquired pneumonia of right lower lobe of lung ([place]) [J18.1]  DISCHARGE DIAGNOSIS:  Active Problems:   Sepsis due to pneumonia ([place])   SECONDARY DIAGNOSIS:   Past Medical History:  Diagnosis Date  . Arrhythmia    tachycardia, A-Fib  . Arthritis   . Blind right eye   . BPH (benign prostatic hyperplasia)   . Diabetes mellitus   . DVT (deep venous thrombosis) ([place]) 02/2010   leg thrombus ; dislodged into emboli and caused PE  . Dyspnea   . Dysrhythmia   . 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 GERD (gastroesophageal reflux disease)   . Hypercholesteremia   . Hypertension   . Kidney failure   . Neuromuscular disorder ([place])   . Neuropathy   . Pneumonia    time 9 ;last episode 12/2015  . Pulmonary embolus ([place]) 2011  . Seasonal allergies   . Seizures ([place])    as child   . Sleep apnea    BIPAP  . Stiff neck    limited turning s/p titanium plate placement  . Stroke ([place])   . TIA (transient ischemic attack)   . Wears dentures    full upper and lower    HOSPITAL COURSE:   1.  Clinical sepsis present on admission secondary to pneumonia.  One blood culture growing MRSA.  Patient also had lactic acidosis.  Repeat blood cultures negative.  TEE will not be able to be done secondary to sleep apnea and hypoxia.  PICC line placed.  Patient will need at least 4 weeks of IV vancomycin and potentially 6 weeks.  Patient will follow-up with Dr. Ola Spurr infectious disease as outpatient.  Weekly labs to go to Dr.  Ola Spurr. 2.  Chronic hypoxic respiratory failure.  Patient prescribed 2 L of oxygen upon disposition.  The patient initially came in with acute respiratory failure and placed on BiPAP.  We were unable to taper him off oxygen.  Patient's diagnosis is chronic microaspiration and chronic obesity hypoventilation syndrome which are contributing to the patient's chronic hypoxic respiratory failure. 3.  History of sleep apnea on CPAP at night 4.  History of esophageal stricture status post dilatation last endoscopy 5.  Type 2 diabetes mellitus on glargine insulin and sliding scale 6.  Obesity 7.  Essential hypertension on hydrochlorothiazide, metoprolol and Lasix 8.  Restless leg syndrome on Lyrica 9.  Hyperlipidemia unspecified on atorvastatin 1 10.  History of arrhythmia on amiodarone metoprolol. 11.  Chronic pain on high-dose pain medications.  Would consider taper these as outpatient if able to do so.   DISCHARGE CONDITIONS:   Satisfactory  CONSULTS OBTAINED:  Treatment Team:  Leonel Ramsay, MD End, Harrell Gave, MD  DRUG ALLERGIES:   Allergies  Allergen Reactions  . Carisoprodol Itching    DISCHARGE MEDICATIONS:   Allergies as of 04/28/2018      Reactions   Carisoprodol Itching      Medication List    STOP taking these medications   metFORMIN 1000 MG tablet Commonly known as:  GLUCOPHAGE  TAKE these medications   amiodarone 200 MG tablet Commonly known as:  PACERONE TAKE 1 TABLET (200 MG TOTAL) BY MOUTH DAILY. What changed:    how much to take  how to take this  when to take this  additional instructions   aspirin 81 MG tablet Take 81 mg by mouth daily.   CENTRUM SILVER PO Take 1 tablet by mouth daily.   CINNAMON PO Take 1,000 mg by mouth 2 (two) times daily.   cyclobenzaprine 10 MG tablet Commonly known as:  FLEXERIL Take 10 mg by mouth 3 (three) times daily.   DEPO-TESTOSTERONE IM Inject into the muscle every 21 ( twenty-one) days.    dutasteride 0.5 MG capsule Commonly known as:  AVODART Take 1 capsule (0.5 mg total) by mouth daily.   esomeprazole 40 MG capsule Commonly known as:  NEXIUM TAKE 1 CAPSULE TWICE DAILY   feeding supplement (ENSURE ENLIVE) Liqd Take 237 mLs by mouth 2 (two) times daily between meals.   fexofenadine 180 MG tablet Commonly known as:  ALLEGRA Take 180 mg by mouth daily.   Fish Oil 1200 MG Caps Take 1,200 mg by mouth 2 (two) times daily.   furosemide 40 MG tablet Commonly known as:  LASIX Take 1 tablet (40 mg total) by mouth daily.   glucose blood test strip Commonly known as:  ONE TOUCH ULTRA TEST CHECK SUGAR TWICE DAILY. DX- E11.8   hydrochlorothiazide 12.5 MG tablet Commonly known as:  HYDRODIURIL TAKE 2 TABLETS (25 MG TOTAL) BY MOUTH AT BEDTIME.   insulin aspart 100 UNIT/ML FlexPen Commonly known as:  NOVOLOG FLEXPEN Inject 12 Units into the skin 3 (three) times daily with meals.   JANUVIA 100 MG tablet Generic drug:  sitaGLIPtin TAKE 1 TABLET DAILY   LANTUS SOLOSTAR 100 UNIT/ML Solostar Pen Generic drug:  Insulin Glargine Inject 30 Units into the skin 2 (two) times daily.   liraglutide 18 MG/3ML Sopn Commonly known as:  VICTOZA INJECT 0.3ML (=1.8MG ) INTO THE SKIN DAILY   magnesium oxide 400 MG tablet Commonly known as:  MAG-OX TAKE 1 TABLET (400 MG TOTAL) BY MOUTH 2 (TWO) TIMES DAILY.   metoprolol tartrate 100 MG tablet Commonly known as:  LOPRESSOR TAKE 1 TABLET (100 MG TOTAL) BY MOUTH 2 (TWO) TIMES DAILY.   oxyCODONE 40 mg 12 hr tablet Commonly known as:  OXYCONTIN Take 1 tablet (40 mg total) by mouth every 8 (eight) hours.   Pen Needles 31G X 5 MM Misc 1 each by Does not apply route daily. PATIENT NEEDS NOVA TWIST NEEDLES 5 MM  DX E11.9   NOVOTWIST 32G X 5 MM Misc Generic drug:  Insulin Pen Needle USE 6 TIMES A DAY DX E11.9   pregabalin 200 MG capsule Commonly known as:  LYRICA Take 200 mg by mouth 2 (two) times daily with breakfast and lunch.    pregabalin 300 MG capsule Commonly known as:  LYRICA qhs   ranitidine 300 MG tablet Commonly known as:  ZANTAC Take 1 tablet (300 mg total) by mouth daily.   sucralfate 1 g tablet Commonly known as:  CARAFATE TAKE 1 TABLET (1 G TOTAL) BY MOUTH 4 (FOUR) TIMES DAILY - WITH MEALS AND AT BEDTIME.   vancomycin 1,500 mg in sodium chloride 0.9 % 500 mL Inject 1,500 mg into the vein every 12 (twelve) hours.            Durable Medical Equipment  (From admission, onward)        Start  Ordered   04/28/18 0909  For home use only DME oxygen  Once    Question Answer Comment  Mode or (Route) Nasal cannula   Liters per Minute 2   Frequency Continuous (stationary and portable oxygen unit needed)   Oxygen conserving device Yes   Oxygen delivery system Gas      04/28/18 0908       DISCHARGE INSTRUCTIONS:    Follow-up PMD 6 days Follow-up Dr. Ola Spurr 1 to 2 weeks  If you experience worsening of your admission symptoms, develop shortness of breath, life threatening emergency, suicidal or homicidal thoughts you must seek medical attention immediately by calling 911 or calling your MD immediately  if symptoms less severe.  You Must read complete instructions/literature along with all the possible adverse reactions/side effects for all the Medicines you take and that have been prescribed to you. Take any new Medicines after you have completely understood and accept all the possible adverse reactions/side effects.   Please note  You were cared for by a hospitalist during your hospital stay. If you have any questions about your discharge medications or the care you received while you were in the hospital after you are discharged, you can call the unit and asked to speak with the hospitalist on call if the hospitalist that took care of you is not available. Once you are discharged, your primary care physician will handle any further medical issues. Please note that NO REFILLS for any  discharge medications will be authorized once you are discharged, as it is imperative that you return to your primary care physician (or establish a relationship with a primary care physician if you do not have one) for your aftercare needs so that they can reassess your need for medications and monitor your lab values.    Today   CHIEF COMPLAINT:   Chief Complaint  Patient presents with  . Fever    HISTORY OF PRESENT ILLNESS:  Kalieb Freeland  is a 67 y.o. male presented with fever   VITAL SIGNS:  Blood pressure (!) 122/57, pulse 82, temperature 99.1 F (37.3 C), temperature source Oral, resp. rate (!) 22, height 5\' 11"  (1.803 m), weight 121.8 kg (268 lb 8.3 oz), SpO2 90 %.    PHYSICAL EXAMINATION:  GENERAL:  67 y.o.-year-old patient lying in the bed with no acute distress.  EYES: Pupils equal, round, reactive to light and accommodation. No scleral icterus. Extraocular muscles intact.  HEENT: Head atraumatic, normocephalic. Oropharynx and nasopharynx clear.  NECK:  Supple, no jugular venous distention. No thyroid enlargement, no tenderness.  LUNGS: Normal breath sounds bilaterally, no wheezing, rales,rhonchi or crepitation. No use of accessory muscles of respiration.  CARDIOVASCULAR: S1, S2 normal. No murmurs, rubs, or gallops.  ABDOMEN: Soft, non-tender, non-distended. Bowel sounds present. No organomegaly or mass.  EXTREMITIES: 2+ pedal edema, no cyanosis, or clubbing.  NEUROLOGIC: Cranial nerves II through XII are intact. Muscle strength 5/5 in all extremities. Sensation intact. Gait not checked.  PSYCHIATRIC: The patient is alert and oriented x 3.  SKIN: No obvious rash, lesion, or ulcer.   DATA REVIEW:   CBC Recent Labs  Lab 04/25/18 0525  WBC 5.9  HGB 10.2*  HCT 30.9*  PLT 108*    Chemistries  Recent Labs  Lab 04/22/18 0254  04/24/18 0404  04/27/18 1130  NA 136   < > 136   < > 136  K 3.6   < > 3.8   < > 3.9  CL 98*   < >  100*   < > 98*  CO2 29   < > 30   <  > 31  GLUCOSE 359*   < > 191*   < > 223*  BUN 26*   < > 15   < > 14  CREATININE 1.42*   < > 1.12   < > 1.05  CALCIUM 9.5   < > 9.1   < > 9.7  MG  --    < > 1.8  --   --   AST 45*  --   --   --   --   ALT 35  --   --   --   --   ALKPHOS 110  --   --   --   --   BILITOT 0.6  --   --   --   --    < > = values in this interval not displayed.    Cardiac Enzymes Recent Labs  Lab 04/22/18 0641  TROPONINI 0.04*    Microbiology Results  Results for orders placed or performed during the hospital encounter of 04/22/18  Blood Culture (routine x 2)     Status: None   Collection Time: 04/22/18  2:54 AM  Result Value Ref Range Status   Specimen Description BLOOD BLOOD RIGHT HAND  Final   Special Requests   Final    BOTTLES DRAWN AEROBIC AND ANAEROBIC Blood Culture adequate volume   Culture   Final    NO GROWTH 5 DAYS Performed at [facility], [address], [place], [place] [address]    Report Status 04/27/2018 FINAL  Final  Blood Culture (routine x 2)     Status: Abnormal   Collection Time: 04/22/18  2:54 AM  Result Value Ref Range Status   Specimen Description   Final    BLOOD RIGHT ANTECUBITAL Performed at [facility], [address], [place], [place] [address]    Special Requests   Final    BOTTLES DRAWN AEROBIC AND ANAEROBIC Blood Culture adequate volume Performed at [facility], [address] [place], [place] [address]    Culture  Setup Time   Final    ANAEROBIC BOTTLE ONLY GRAM POSITIVE COCCI IN CLUSTERS CRITICAL RESULT CALLED TO, READ BACK BY AND VERIFIED WITH: Flora 04/22/18.PMH Performed at [facility], [place] [address], [place], [place] [address]    Culture METHICILLIN RESISTANT STAPHYLOCOCCUS AUREUS (A)  Final   Report Status 04/25/2018 FINAL  Final   Organism ID, Bacteria METHICILLIN RESISTANT STAPHYLOCOCCUS AUREUS  Final      Susceptibility   Methicillin resistant staphylococcus aureus - MIC*     CIPROFLOXACIN >=8 RESISTANT Resistant     ERYTHROMYCIN >=8 RESISTANT Resistant     GENTAMICIN <=0.5 SENSITIVE Sensitive     OXACILLIN RESISTANT Resistant     TETRACYCLINE <=1 SENSITIVE Sensitive     VANCOMYCIN <=0.5 SENSITIVE Sensitive     TRIMETH/SULFA <=10 SENSITIVE Sensitive     CLINDAMYCIN <=0.25 SENSITIVE Sensitive     RIFAMPIN <=0.5 SENSITIVE Sensitive     Inducible Clindamycin NEGATIVE Sensitive     * METHICILLIN RESISTANT STAPHYLOCOCCUS AUREUS  Blood Culture ID Panel (Reflexed)     Status: Abnormal   Collection Time: 04/22/18  2:54 AM  Result Value Ref Range Status   Enterococcus species NOT DETECTED NOT DETECTED Final   Listeria monocytogenes NOT DETECTED NOT DETECTED Final   Staphylococcus species DETECTED (A) NOT DETECTED Final  Comment: CRITICAL RESULT CALLED TO, READ BACK BY AND VERIFIED WITH: GARRETT COFFEE AT 1956 04/22/18.PMH    Staphylococcus aureus DETECTED (A) NOT DETECTED Final    Comment: Methicillin (oxacillin)-resistant Staphylococcus aureus (MRSA). MRSA is predictably resistant to beta-lactam antibiotics (except ceftaroline). Preferred therapy is vancomycin unless clinically contraindicated. Patient requires contact precautions if  hospitalized. CRITICAL RESULT CALLED TO, READ BACK BY AND VERIFIED WITH: GARRETT COFFEE AT 1956 04/22/18.PMH    Methicillin resistance DETECTED (A) NOT DETECTED Final    Comment: CRITICAL RESULT CALLED TO, READ BACK BY AND VERIFIED WITH: GARRETT COFFEE AT 1956 04/22/18.PMH    Streptococcus species NOT DETECTED NOT DETECTED Final   Streptococcus agalactiae NOT DETECTED NOT DETECTED Final   Streptococcus pneumoniae NOT DETECTED NOT DETECTED Final   Streptococcus pyogenes NOT DETECTED NOT DETECTED Final   Acinetobacter baumannii NOT DETECTED NOT DETECTED Final   Enterobacteriaceae species NOT DETECTED NOT DETECTED Final   Enterobacter cloacae complex NOT DETECTED NOT DETECTED Final   Escherichia coli NOT DETECTED NOT DETECTED Final    Klebsiella oxytoca NOT DETECTED NOT DETECTED Final   Klebsiella pneumoniae NOT DETECTED NOT DETECTED Final   Proteus species NOT DETECTED NOT DETECTED Final   Serratia marcescens NOT DETECTED NOT DETECTED Final   Haemophilus influenzae NOT DETECTED NOT DETECTED Final   Neisseria meningitidis NOT DETECTED NOT DETECTED Final   Pseudomonas aeruginosa NOT DETECTED NOT DETECTED Final   Candida albicans NOT DETECTED NOT DETECTED Final   Candida glabrata NOT DETECTED NOT DETECTED Final   Candida krusei NOT DETECTED NOT DETECTED Final   Candida parapsilosis NOT DETECTED NOT DETECTED Final   Candida tropicalis NOT DETECTED NOT DETECTED Final    Comment: Performed at [facility], [address], [place], [place] [address]  Urine culture     Status: None   Collection Time: 04/22/18  2:55 AM  Result Value Ref Range Status   Specimen Description   Final    URINE, RANDOM Performed at [facility], [address], [place], [place] [address]    Special Requests   Final    NONE Performed at [facility], [address] [place], [place] [address]    Culture   Final    NO GROWTH Performed at [facility], [place] [address], [place], [place] [address]    Report Status 04/23/2018 FINAL  Final  MRSA PCR Screening     Status: None   Collection Time: 04/22/18  5:53 AM  Result Value Ref Range Status   MRSA by PCR NEGATIVE NEGATIVE Final    Comment:        The GeneXpert MRSA Assay (FDA approved for NASAL specimens only), is one component of a comprehensive MRSA colonization surveillance program. It is not intended to diagnose MRSA infection nor to guide or monitor treatment for MRSA infections. Performed at [facility], [address] [place], [place] [address]   Culture, blood (Routine X 2) w Reflex to ID Panel     Status: None   Collection Time: 04/22/18 11:33 PM  Result Value Ref Range Status   Specimen Description BLOOD LEFT ANTECUBITAL   Final   Special Requests   Final    BOTTLES DRAWN AEROBIC AND ANAEROBIC Blood Culture adequate volume   Culture   Final    NO GROWTH 5 DAYS Performed at [facility], [address] [place], [place] [address]    Report Status 04/27/2018 FINAL  Final  Culture, blood (Routine X 2) w Reflex  to ID Panel     Status: None   Collection Time: 04/22/18 11:43 PM  Result Value Ref Range Status   Specimen Description BLOOD LEFT HAND  Final   Special Requests   Final    BOTTLES DRAWN AEROBIC AND ANAEROBIC Blood Culture results may not be optimal due to an excessive volume of blood received in culture bottles   Culture   Final    NO GROWTH 5 DAYS Performed at [facility], [address] [place], [place] [address]    Report Status 04/27/2018 FINAL  Final  CULTURE, BLOOD (ROUTINE X 2) w Reflex to ID Panel     Status: None (Preliminary result)   Collection Time: 04/24/18  4:04 AM  Result Value Ref Range Status   Specimen Description BLOOD RIGHT ANTECUBITAL  Final   Special Requests   Final    BOTTLES DRAWN AEROBIC AND ANAEROBIC Blood Culture adequate volume   Culture   Final    NO GROWTH 4 DAYS Performed at [facility], [address] [place], [place] [address]    Report Status PENDING  Incomplete  CULTURE, BLOOD (ROUTINE X 2) w Reflex to ID Panel     Status: None (Preliminary result)   Collection Time: 04/24/18  4:06 AM  Result Value Ref Range Status   Specimen Description BLOOD LEFT HAND  Final   Special Requests   Final    BOTTLES DRAWN AEROBIC AND ANAEROBIC Blood Culture adequate volume   Culture   Final    NO GROWTH 4 DAYS Performed at [facility], [address] [place], [place] [address]    Report Status PENDING  Incomplete       Management plans discussed with the patient,  and he is in agreement.  CODE STATUS:     Code Status Orders  (From admission, onward)        Start     Ordered   04/22/18 0536  Full code  Continuous      04/22/18 0535    Code Status History    Date Active Date Inactive Code Status Order ID Comments User Context   06/20/2017 1035 06/22/2017 1801 Full Code [id number]  Gaynelle Arabian, MD Inpatient   05/08/2017 0002 05/10/2017 1512 Full Code [id number]  Harvie Bridge, DO Inpatient   09/08/2015 2107 09/09/2015 1804 Full Code [id number]  Demetrios Loll, MD Inpatient    Advance Directive Documentation     Most Recent Value  Type of Advance Directive  Healthcare Power of Attorney  Pre-existing out of facility DNR order (yellow form or pink MOST form)  -  "MOST" Form in Place?  -      TOTAL TIME TAKING CARE OF THIS PATIENT: 35 minutes.    Loletha Grayer M.D on 04/28/2018 at 4:59 PM  Between 7am to 6pm - Pager - [phone]  After 6pm go to [email or web address] - password [email or web address] [facility]  [phone]  CC: Primary care physician; Jerrol Banana., MD

## 2018-04-28 NOTE — Progress Notes (Signed)
Patient discharged to home, picc line flushed, O2 sent home with patient. Belongings in hand.Accy downstairs via wheelchair.

## 2018-04-28 NOTE — Progress Notes (Signed)
Inpatient Diabetes Program Recommendations  AACE/ADA: New Consensus Statement on Inpatient Glycemic Control (2015)  Target Ranges:  Prepandial:   less than 140 mg/dL      Peak postprandial:   less than 180 mg/dL (1-2 hours)      Critically ill patients:  140 - 180 mg/dL   Lab Results  Component Value Date   GLUCAP 239 (H) 04/28/2018   HGBA1C 6.5 09/14/2017    Review of Glycemic Control Results for Jerry Parsons, Jerry Parsons (MRN [id number]) as of 04/28/2018 11:18  Ref. Range 04/27/2018 07:42 04/27/2018 12:21 04/27/2018 16:43 04/27/2018 20:48 04/28/2018 07:25  Glucose-Capillary Latest Ref Range: 65 - 99 mg/dL 200 (H) 229 (H) 352 (H) 176 (H) 239 (H)   Diabetes history: Type 2 DM  Outpatient Diabetes medications: Novolog 14 units tid with meals, Januvia 100 mg, Victoza 1.8 mg daily, Metformin 1000 mg bid, Lantus 30 units bid Current orders for Inpatient glycemic control:  Novolog resistant correction tid + hs 0-5 units, Lantus 30 units bid, Novolog 14 units tid with  meals  Inpatient Diabetes Program Recommendations:   Noted postprandial hyperglycemia. -Increase Novolog meal coverage to 16 tid  Thank you, Nani Gasser. Catherine Oak, RN, MSN, CDE  Diabetes Coordinator Inpatient Glycemic Control Team Team Pager [phone] (8am-5pm) 04/28/2018 11:20 AM

## 2018-04-28 NOTE — Progress Notes (Signed)
Reviewed Discharge instructions with patient and wife.

## 2018-04-28 NOTE — Care Management Note (Signed)
Case Management Note  Patient Details  Name: [name] MRN: [id number] Date of Birth: May 24, 1951   Patient to discharge home today.  Start of care for home IV antibiotics 04/29/18.  Portable O2 tank to be delivered to room prior to discharge.  Corene Cornea with [facility] is aware of discharge.  RNCM signing off   Subjective/Objective:                    Action/Plan:   Expected Discharge Date:  04/28/18               Expected Discharge Plan:  [place]  In-House Referral:     Discharge planning Services  CM Consult  Post Acute Care Choice:  Home Health Choice offered to:  Patient, Spouse  DME Arranged:  Oxygen, IV pump/equipment DME Agency:  [place] Arranged:  RN, PT [facility] Agency:  [place]  Status of Service:  Completed, signed off  If discussed at [place] of Stay Meetings, dates discussed:    Additional Comments:  Beverly Sessions, RN 04/28/2018, 2:46 PM

## 2018-04-29 DIAGNOSIS — Z9981 Dependence on supplemental oxygen: Secondary | ICD-10-CM | POA: Diagnosis not present

## 2018-04-29 DIAGNOSIS — N182 Chronic kidney disease, stage 2 (mild): Secondary | ICD-10-CM | POA: Diagnosis not present

## 2018-04-29 DIAGNOSIS — Z7982 Long term (current) use of aspirin: Secondary | ICD-10-CM | POA: Diagnosis not present

## 2018-04-29 DIAGNOSIS — J181 Lobar pneumonia, unspecified organism: Secondary | ICD-10-CM | POA: Diagnosis not present

## 2018-04-29 DIAGNOSIS — Z792 Long term (current) use of antibiotics: Secondary | ICD-10-CM | POA: Diagnosis not present

## 2018-04-29 DIAGNOSIS — I5032 Chronic diastolic (congestive) heart failure: Secondary | ICD-10-CM | POA: Diagnosis not present

## 2018-04-29 DIAGNOSIS — Z452 Encounter for adjustment and management of vascular access device: Secondary | ICD-10-CM | POA: Diagnosis not present

## 2018-04-29 DIAGNOSIS — E669 Obesity, unspecified: Secondary | ICD-10-CM | POA: Diagnosis not present

## 2018-04-29 DIAGNOSIS — H547 Unspecified visual loss: Secondary | ICD-10-CM | POA: Diagnosis not present

## 2018-04-29 DIAGNOSIS — A4102 Sepsis due to Methicillin resistant Staphylococcus aureus: Secondary | ICD-10-CM | POA: Diagnosis not present

## 2018-04-29 DIAGNOSIS — G4733 Obstructive sleep apnea (adult) (pediatric): Secondary | ICD-10-CM | POA: Diagnosis not present

## 2018-04-29 DIAGNOSIS — J9611 Chronic respiratory failure with hypoxia: Secondary | ICD-10-CM | POA: Diagnosis not present

## 2018-04-29 DIAGNOSIS — I480 Paroxysmal atrial fibrillation: Secondary | ICD-10-CM | POA: Diagnosis not present

## 2018-04-29 DIAGNOSIS — I130 Hypertensive heart and chronic kidney disease with heart failure and stage 1 through stage 4 chronic kidney disease, or unspecified chronic kidney disease: Secondary | ICD-10-CM | POA: Diagnosis not present

## 2018-04-29 DIAGNOSIS — R1312 Dysphagia, oropharyngeal phase: Secondary | ICD-10-CM | POA: Diagnosis not present

## 2018-04-29 DIAGNOSIS — Z86711 Personal history of pulmonary embolism: Secondary | ICD-10-CM | POA: Diagnosis not present

## 2018-04-29 DIAGNOSIS — G2581 Restless legs syndrome: Secondary | ICD-10-CM | POA: Diagnosis not present

## 2018-04-29 DIAGNOSIS — N400 Enlarged prostate without lower urinary tract symptoms: Secondary | ICD-10-CM | POA: Diagnosis not present

## 2018-04-29 DIAGNOSIS — Z8673 Personal history of transient ischemic attack (TIA), and cerebral infarction without residual deficits: Secondary | ICD-10-CM | POA: Diagnosis not present

## 2018-04-29 DIAGNOSIS — E1122 Type 2 diabetes mellitus with diabetic chronic kidney disease: Secondary | ICD-10-CM | POA: Diagnosis not present

## 2018-04-29 DIAGNOSIS — Z5181 Encounter for therapeutic drug level monitoring: Secondary | ICD-10-CM | POA: Diagnosis not present

## 2018-04-29 DIAGNOSIS — Z794 Long term (current) use of insulin: Secondary | ICD-10-CM | POA: Diagnosis not present

## 2018-04-29 DIAGNOSIS — M1990 Unspecified osteoarthritis, unspecified site: Secondary | ICD-10-CM | POA: Diagnosis not present

## 2018-04-29 DIAGNOSIS — Z86718 Personal history of other venous thrombosis and embolism: Secondary | ICD-10-CM | POA: Diagnosis not present

## 2018-04-29 DIAGNOSIS — E1140 Type 2 diabetes mellitus with diabetic neuropathy, unspecified: Secondary | ICD-10-CM | POA: Diagnosis not present

## 2018-04-29 LAB — CULTURE, BLOOD (ROUTINE X 2)
Culture: NO GROWTH
Culture: NO GROWTH
Special Requests: ADEQUATE
Special Requests: ADEQUATE

## 2018-05-01 ENCOUNTER — Telehealth: Payer: Self-pay

## 2018-05-01 DIAGNOSIS — I5032 Chronic diastolic (congestive) heart failure: Secondary | ICD-10-CM | POA: Diagnosis not present

## 2018-05-01 DIAGNOSIS — N182 Chronic kidney disease, stage 2 (mild): Secondary | ICD-10-CM | POA: Diagnosis not present

## 2018-05-01 DIAGNOSIS — J181 Lobar pneumonia, unspecified organism: Secondary | ICD-10-CM | POA: Diagnosis not present

## 2018-05-01 DIAGNOSIS — I130 Hypertensive heart and chronic kidney disease with heart failure and stage 1 through stage 4 chronic kidney disease, or unspecified chronic kidney disease: Secondary | ICD-10-CM | POA: Diagnosis not present

## 2018-05-01 DIAGNOSIS — A419 Sepsis, unspecified organism: Secondary | ICD-10-CM | POA: Diagnosis not present

## 2018-05-01 DIAGNOSIS — Z452 Encounter for adjustment and management of vascular access device: Secondary | ICD-10-CM | POA: Diagnosis not present

## 2018-05-01 DIAGNOSIS — A4102 Sepsis due to Methicillin resistant Staphylococcus aureus: Secondary | ICD-10-CM | POA: Diagnosis not present

## 2018-05-01 NOTE — Telephone Encounter (Signed)
Transition Care Management Follow-Up Telephone Call   Date discharged and where: [facility] on 04/28/18.  How have you been since you were released from the hospital? Doing much better, oxygen levels are staying up. Pt has not used his oxygen tank today and current reading is 95. Declines cough, sputum, SOB or fever.   Any patient concerns? None.  Items Reviewed:   Meds: verified  Allergies: verified  Dietary Changes Reviewed: soft diet  Functional Questionnaire:  Independent-I Dependent-D  ADLs:   Dressing- I    Eating- I   Maintaining continence- I   Transferring- I   Transportation- I, not currently   Meal Prep- I   Managing Meds- I  Confirmed importance and Date/Time of follow-up visits scheduled: 05/05/18 @ 11 AM with [name].  Confirmed with patient if condition worsens to call PCP or go to the Emergency Dept. Patient was given office number and encouraged to call back with questions or concerns: YES

## 2018-05-02 ENCOUNTER — Other Ambulatory Visit: Payer: Self-pay | Admitting: Family Medicine

## 2018-05-02 DIAGNOSIS — IMO0002 Reserved for concepts with insufficient information to code with codable children: Secondary | ICD-10-CM

## 2018-05-02 DIAGNOSIS — E1165 Type 2 diabetes mellitus with hyperglycemia: Secondary | ICD-10-CM

## 2018-05-02 DIAGNOSIS — Z794 Long term (current) use of insulin: Principal | ICD-10-CM

## 2018-05-02 DIAGNOSIS — E118 Type 2 diabetes mellitus with unspecified complications: Principal | ICD-10-CM

## 2018-05-04 DIAGNOSIS — I130 Hypertensive heart and chronic kidney disease with heart failure and stage 1 through stage 4 chronic kidney disease, or unspecified chronic kidney disease: Secondary | ICD-10-CM | POA: Diagnosis not present

## 2018-05-04 DIAGNOSIS — A419 Sepsis, unspecified organism: Secondary | ICD-10-CM | POA: Diagnosis not present

## 2018-05-04 DIAGNOSIS — J181 Lobar pneumonia, unspecified organism: Secondary | ICD-10-CM | POA: Diagnosis not present

## 2018-05-04 DIAGNOSIS — N182 Chronic kidney disease, stage 2 (mild): Secondary | ICD-10-CM | POA: Diagnosis not present

## 2018-05-04 DIAGNOSIS — Z452 Encounter for adjustment and management of vascular access device: Secondary | ICD-10-CM | POA: Diagnosis not present

## 2018-05-04 DIAGNOSIS — A4102 Sepsis due to Methicillin resistant Staphylococcus aureus: Secondary | ICD-10-CM | POA: Diagnosis not present

## 2018-05-04 DIAGNOSIS — I5032 Chronic diastolic (congestive) heart failure: Secondary | ICD-10-CM | POA: Diagnosis not present

## 2018-05-05 ENCOUNTER — Encounter: Payer: Self-pay | Admitting: Family Medicine

## 2018-05-05 ENCOUNTER — Ambulatory Visit (INDEPENDENT_AMBULATORY_CARE_PROVIDER_SITE_OTHER): Payer: Medicare Other | Admitting: Family Medicine

## 2018-05-05 VITALS — BP 142/72 | HR 74 | Temp 99.1°F | Wt 267.2 lb

## 2018-05-05 DIAGNOSIS — A419 Sepsis, unspecified organism: Secondary | ICD-10-CM

## 2018-05-05 DIAGNOSIS — J189 Pneumonia, unspecified organism: Secondary | ICD-10-CM

## 2018-05-05 DIAGNOSIS — M47816 Spondylosis without myelopathy or radiculopathy, lumbar region: Secondary | ICD-10-CM | POA: Insufficient documentation

## 2018-05-05 DIAGNOSIS — M48061 Spinal stenosis, lumbar region without neurogenic claudication: Secondary | ICD-10-CM | POA: Insufficient documentation

## 2018-05-05 NOTE — Progress Notes (Signed)
[name]    DOB: 1951-11-10   67 y.o.   MRN: [id number] Visit Date: 05/05/2018  Today's Provider: Vernie Murders, PA   Chief Complaint  Patient presents with  . Hospitalization Follow-up   Subjective:    HPI  Follow up Hospitalization   Patient was admitted to [facility] on 04/22/18 and discharged on 04/28/18. He was treated for sepsis due to pneumonia. Treatment for this included started Vancomycin 1500 mg, Oxygen, and feeding supplement. Patient was advised to stopped Vancomycin on 05/04/18 due to kidney function being elevated. Telephone follow up was done on 05/01/18 He reports good compliance with treatment. He reports this condition is Improved.  ------------------------------------------------------------------------------------ Past Medical History:  Diagnosis Date  . Arrhythmia    tachycardia, A-Fib  . Arthritis   . Blind right eye   . BPH (benign prostatic hyperplasia)   . Diabetes mellitus   . DVT (deep venous thrombosis) ([place]) 02/2010   leg thrombus ; dislodged into emboli and caused PE  . Dyspnea   . Dysrhythmia   . 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 GERD (gastroesophageal reflux disease)   . Hypercholesteremia   . Hypertension   . Kidney failure   . Neuromuscular disorder ([place])   . Neuropathy   . Pneumonia    time 9 ;last episode 12/2015  . Pulmonary embolus ([place]) 2011  . Seasonal allergies   . Seizures ([place])    as child   . Sleep apnea    BIPAP  . Stiff neck    limited turning s/p titanium plate placement  . Stroke ([place])   . TIA (transient ischemic attack)   . Wears dentures    full upper and lower   Past Surgical History:  Procedure Laterality Date  . APPENDECTOMY    . BACK SURGERY     x 8; upper x 3 & lower x 5  . CARDIOVERSION  03/14/13, 10/16   2014 - [place], 2016 - [place]  . CATARACT EXTRACTION W/PHACO Left 10/29/2015   Procedure: CATARACT EXTRACTION PHACO AND INTRAOCULAR LENS PLACEMENT (IOC);  Surgeon:  Leandrew Koyanagi, MD;  Location: [place];  Service: Ophthalmology;  Laterality: Left;  DIABETIC - insulin and oral medsSleep apnea - no machine  . CHOLECYSTECTOMY    . COLONOSCOPY WITH PROPOFOL N/A 01/16/2018   Procedure: COLONOSCOPY WITH PROPOFOL;  Surgeon: Manya Silvas, MD;  Location[facility] ENDOSCOPY;  Service: Endoscopy;  Laterality: N/A;  . ESOPHAGOGASTRODUODENOSCOPY (EGD) WITH PROPOFOL N/A 01/16/2018   Procedure: ESOPHAGOGASTRODUODENOSCOPY (EGD) WITH PROPOFOL;  Surgeon: Manya Silvas, MD;  Location: [facility] ENDOSCOPY;  Service: Endoscopy;  Laterality: N/A;  . EYE SURGERY    . GALLBLADDER SURGERY  2002  . JOINT REPLACEMENT Right 2018  . KNEE ARTHROSCOPY     left   . ROTATOR CUFF REPAIR  2001   left   . TEE WITHOUT CARDIOVERSION N/A 04/26/2018   Procedure: TRANSESOPHAGEAL ECHOCARDIOGRAM (TEE);  Surgeon: Nelva Bush, MD;  Location: [facility] ORS;  Service: Cardiovascular;  Laterality: N/A;  . TONSILLECTOMY    . 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 Heart attack Brother    Allergies  Allergen Reactions  . Carisoprodol Itching     Current Outpatient Medications:  .  amiodarone (PACERONE) 200 MG tablet, TAKE  1 TABLET (200 MG TOTAL) BY MOUTH DAILY. (Patient taking differently: Take 200 mg by mouth daily. ), Disp: 180 tablet, Rfl: 3 .  aspirin 81 MG tablet, Take 81 mg by mouth daily., Disp: , Rfl:  .  CINNAMON PO, Take 1,000 mg by mouth 2 (two) times daily. , Disp: , Rfl:  .  cyclobenzaprine (FLEXERIL) 10 MG tablet, Take 10 mg by mouth 3 (three) times daily. , Disp: , Rfl:  .  dutasteride (AVODART) 0.5 MG capsule, Take 1 capsule (0.5 mg total) by mouth daily., Disp: 90 capsule, Rfl: 3 .  esomeprazole (NEXIUM) 40 MG capsule, TAKE 1 CAPSULE TWICE DAILY, Disp: 90 capsule, Rfl: 3 .   feeding supplement, ENSURE ENLIVE, (ENSURE ENLIVE) LIQD, Take 237 mLs by mouth 2 (two) times daily between meals., Disp: 60 Bottle, Rfl: 0 .  fexofenadine (ALLEGRA) 180 MG tablet, Take 180 mg by mouth daily.  , Disp: , Rfl:  .  furosemide (LASIX) 40 MG tablet, Take 1 tablet (40 mg total) by mouth daily., Disp: 30 tablet, Rfl: 11 .  glucose blood (ONE TOUCH ULTRA TEST) test strip, CHECK SUGAR TWICE DAILY. DX- E11.8, Disp: 100 each, Rfl: 3 .  hydrochlorothiazide (HYDRODIURIL) 12.5 MG tablet, TAKE 2 TABLETS (25 MG TOTAL) BY MOUTH AT BEDTIME., Disp: 90 tablet, Rfl: 3 .  insulin aspart (NOVOLOG FLEXPEN) 100 UNIT/ML FlexPen, Inject 12 Units into the skin 3 (three) times daily with meals., Disp: , Rfl:  .  Insulin Pen Needle (PEN NEEDLES) 31G X 5 MM MISC, 1 each by Does not apply route daily. PATIENT NEEDS NOVA TWIST NEEDLES 5 MM  DX E11.9, Disp: 200 each, Rfl: 3 .  JANUVIA 100 MG tablet, TAKE 1 TABLET DAILY, Disp: 90 tablet, Rfl: 3 .  LANTUS SOLOSTAR 100 UNIT/ML Solostar Pen, INJECT 30 UNITS            SUBCUTANEOUSLY TWO TIMES A DAY, Disp: 45 mL, Rfl: 3 .  liraglutide (VICTOZA) 18 MG/3ML SOPN, INJECT 0.3ML (=1.8MG ) INTO THE SKIN DAILY, Disp: 27 mL, Rfl: 3 .  magnesium oxide (MAG-OX) 400 MG tablet, TAKE 1 TABLET (400 MG TOTAL) BY MOUTH 2 (TWO) TIMES DAILY., Disp: 180 tablet, Rfl: 3 .  metoprolol tartrate (LOPRESSOR) 100 MG tablet, TAKE 1 TABLET (100 MG TOTAL) BY MOUTH 2 (TWO) TIMES DAILY., Disp: 60 tablet, Rfl: 10 .  Multiple Vitamins-Minerals (CENTRUM SILVER PO), Take 1 tablet by mouth daily. , Disp: , Rfl:  .  NOVOTWIST 32G X 5 MM MISC, USE 6 TIMES A DAY DX E11.9, Disp: 90 each, Rfl: 9 .  Omega-3 Fatty Acids (FISH OIL) 1200 MG CAPS, Take 1,200 mg by mouth 2 (two) times daily. , Disp: , Rfl:  .  oxyCODONE (OXYCONTIN) 40 mg 12 hr tablet, Take 1 tablet (40 mg total) by mouth every 8 (eight) hours., Disp: , Rfl:  .  pregabalin (LYRICA) 200 MG capsule, Take 200 mg by mouth 2 (two) times daily with breakfast  and lunch., Disp: , Rfl:  .  pregabalin (LYRICA) 300 MG capsule, qhs, Disp: 90 capsule, Rfl: 5 .  ranitidine (ZANTAC) 300 MG tablet, Take 1 tablet (300 mg total) by mouth daily., Disp: 90 tablet, Rfl: 3 .  sucralfate (CARAFATE) 1 g tablet, TAKE 1 TABLET (1 G TOTAL) BY MOUTH 4 (FOUR) TIMES DAILY - WITH MEALS AND AT BEDTIME., Disp: 120 tablet, Rfl: 11 .  Testosterone Cypionate (DEPO-TESTOSTERONE IM), Inject into the muscle every 21 ( twenty-one) days., Disp: , Rfl:  .  testosterone cypionate (  DEPOTESTOSTERONE CYPIONATE) 200 MG/ML injection, , Disp: , Rfl:  .  vancomycin 1,500 mg in sodium chloride 0.9 % 500 mL, Inject 1,500 mg into the vein every 12 (twelve) hours. (Patient not taking: Reported on 05/05/2018), Disp: 44 Dose, Rfl: 0  Review of Systems  Constitutional: Negative.   Respiratory: Negative.   Cardiovascular: Negative.     Social History   Tobacco Use  . Smoking status: Former Smoker    Packs/day: 2.00    Years: 20.00    Pack years: 40.00    Types: Cigarettes    Last attempt to quit: 12/26/1989    Years since quitting: 28.3  . Smokeless tobacco: Never Used  Substance Use Topics  . Alcohol use: No   Objective:   BP (!) 142/72 (BP Location: Left Arm, Patient Position: Sitting, Cuff Size: Normal)   Pulse 74   Temp 99.1 F (37.3 C) (Oral)   Wt 267 lb 3.2 oz (121.2 kg)   SpO2 99%   BMI 37.27 kg/m    Physical Exam  Constitutional: He is oriented to person, place, and time. He appears well-developed and well-nourished. No distress.  HENT:  Head: Normocephalic and atraumatic.  Right Ear: Hearing normal.  Left Ear: Hearing normal.  Nose: Nose normal.  Eyes: Conjunctivae and lids are normal. Right eye exhibits no discharge. Left eye exhibits no discharge. No scleral icterus.  Cardiovascular: Normal rate and regular rhythm.  Pulmonary/Chest: Effort normal. No respiratory distress. He has rales.  Bubbly rales in the right posterolateral base. No pain or respiratory  distress.  Abdominal: Soft. Bowel sounds are normal.  Neurological: He is alert and oriented to person, place, and time.  Skin: Skin is intact. No lesion and no rash noted.  Psychiatric: He has a normal mood and affect. His speech is normal and behavior is normal. Thought content normal.    Assessment & Plan:   1. Sepsis due to pneumonia [facility]) Hospitalized on 04-22-18 with chills, fever with some cough and right sided chest discomfort. Diagnosed with RLL pneumonia and one blood culture isolated MRSA. Oral antibiotics changed to Vancomycin 1500 mg BID at home with 2 LPM oxygen by nasal cannula. Followed by Dr. Ola Spurr (ID) with labs once a week. States he is feeling much improved and breathing better. Has not used the oxygen at home and wife states pulse oximetry showed O2 at 94-95% on average (once had it down to 90% shortly after getting home from the hospital). Still hear some rales in the right posterolateral base. Will continue the Vancomycin BID, follow up with Dr. [name] on 05-11-18 and with Dr. Ola Spurr on 05-19-18. (Probably needs repeat CXR to assess clearing of pneumonia at follow up). Encouraged to drink plenty fluids and eat diabetic diet TID. Should use oxygen if fatigued or weak.    Vernie Murders, PA  [facility]

## 2018-05-08 DIAGNOSIS — J181 Lobar pneumonia, unspecified organism: Secondary | ICD-10-CM | POA: Diagnosis not present

## 2018-05-08 DIAGNOSIS — I5032 Chronic diastolic (congestive) heart failure: Secondary | ICD-10-CM | POA: Diagnosis not present

## 2018-05-08 DIAGNOSIS — N182 Chronic kidney disease, stage 2 (mild): Secondary | ICD-10-CM | POA: Diagnosis not present

## 2018-05-08 DIAGNOSIS — Z452 Encounter for adjustment and management of vascular access device: Secondary | ICD-10-CM | POA: Diagnosis not present

## 2018-05-08 DIAGNOSIS — I130 Hypertensive heart and chronic kidney disease with heart failure and stage 1 through stage 4 chronic kidney disease, or unspecified chronic kidney disease: Secondary | ICD-10-CM | POA: Diagnosis not present

## 2018-05-08 DIAGNOSIS — A419 Sepsis, unspecified organism: Secondary | ICD-10-CM | POA: Diagnosis not present

## 2018-05-08 DIAGNOSIS — A4102 Sepsis due to Methicillin resistant Staphylococcus aureus: Secondary | ICD-10-CM | POA: Diagnosis not present

## 2018-05-11 ENCOUNTER — Encounter: Payer: Self-pay | Admitting: Family Medicine

## 2018-05-11 ENCOUNTER — Ambulatory Visit (INDEPENDENT_AMBULATORY_CARE_PROVIDER_SITE_OTHER): Payer: Medicare Other | Admitting: Family Medicine

## 2018-05-11 VITALS — BP 132/60 | HR 70 | Temp 98.4°F | Resp 16 | Wt 266.0 lb

## 2018-05-11 DIAGNOSIS — A419 Sepsis, unspecified organism: Secondary | ICD-10-CM | POA: Diagnosis not present

## 2018-05-11 DIAGNOSIS — G8929 Other chronic pain: Secondary | ICD-10-CM | POA: Diagnosis not present

## 2018-05-11 DIAGNOSIS — J189 Pneumonia, unspecified organism: Secondary | ICD-10-CM

## 2018-05-11 DIAGNOSIS — Z09 Encounter for follow-up examination after completed treatment for conditions other than malignant neoplasm: Secondary | ICD-10-CM | POA: Diagnosis not present

## 2018-05-11 DIAGNOSIS — I130 Hypertensive heart and chronic kidney disease with heart failure and stage 1 through stage 4 chronic kidney disease, or unspecified chronic kidney disease: Secondary | ICD-10-CM | POA: Diagnosis not present

## 2018-05-11 DIAGNOSIS — M5441 Lumbago with sciatica, right side: Secondary | ICD-10-CM

## 2018-05-11 DIAGNOSIS — J690 Pneumonitis due to inhalation of food and vomit: Secondary | ICD-10-CM | POA: Diagnosis not present

## 2018-05-11 DIAGNOSIS — Z452 Encounter for adjustment and management of vascular access device: Secondary | ICD-10-CM | POA: Diagnosis not present

## 2018-05-11 DIAGNOSIS — N182 Chronic kidney disease, stage 2 (mild): Secondary | ICD-10-CM | POA: Diagnosis not present

## 2018-05-11 DIAGNOSIS — A4102 Sepsis due to Methicillin resistant Staphylococcus aureus: Secondary | ICD-10-CM | POA: Diagnosis not present

## 2018-05-11 DIAGNOSIS — J181 Lobar pneumonia, unspecified organism: Secondary | ICD-10-CM | POA: Diagnosis not present

## 2018-05-11 DIAGNOSIS — I5032 Chronic diastolic (congestive) heart failure: Secondary | ICD-10-CM | POA: Diagnosis not present

## 2018-05-11 DIAGNOSIS — M5442 Lumbago with sciatica, left side: Secondary | ICD-10-CM | POA: Diagnosis not present

## 2018-05-11 NOTE — Progress Notes (Signed)
[name]    DOB: 1951-05-04   67 y.o.   MRN: [id number] Visit Date: 05/11/2018  Today's Provider: [name], MD   Chief Complaint  Patient presents with  . Hospitalization Follow-up   Subjective:    HPI Hospital stay 04/22/18-04/28/18 [place] due to pneumonia Improving. Pt not using home oxygen, continue vancomycin Needs repeat chest xray. Increase fluids, east DM diet, use Ox id fatigued or weak. Pt had pneumonia and possible positive blood culture for MRSA--this is what PIC line is for.  Pt reports that he is feeling better. He is getting it once a day via his PIC line.      Allergies  Allergen Reactions  . Carisoprodol Itching     Current Outpatient Medications:  .  amiodarone (PACERONE) 200 MG tablet, TAKE 1 TABLET (200 MG TOTAL) BY MOUTH DAILY. (Patient taking differently: Take 200 mg by mouth daily. ), Disp: 180 tablet, Rfl: 3 .  aspirin 81 MG tablet, Take 81 mg by mouth daily., Disp: , Rfl:  .  cyclobenzaprine (FLEXERIL) 10 MG tablet, Take 10 mg by mouth 3 (three) times daily. , Disp: , Rfl:  .  dutasteride (AVODART) 0.5 MG capsule, Take 1 capsule (0.5 mg total) by mouth daily., Disp: 90 capsule, Rfl: 3 .  esomeprazole (NEXIUM) 40 MG capsule, TAKE 1 CAPSULE TWICE DAILY, Disp: 90 capsule, Rfl: 3 .  feeding supplement, ENSURE ENLIVE, (ENSURE ENLIVE) LIQD, Take 237 mLs by mouth 2 (two) times daily between meals., Disp: 60 Bottle, Rfl: 0 .  fexofenadine (ALLEGRA) 180 MG tablet, Take 180 mg by mouth daily.  , Disp: , Rfl:  .  furosemide (LASIX) 40 MG tablet, Take 1 tablet (40 mg total) by mouth daily., Disp: 30 tablet, Rfl: 11 .  glucose blood (ONE TOUCH ULTRA TEST) test strip, CHECK SUGAR TWICE DAILY. DX- E11.8, Disp: 100 each, Rfl: 3 .  hydrochlorothiazide (HYDRODIURIL) 12.5 MG tablet, TAKE 2 TABLETS (25 MG TOTAL) BY MOUTH AT BEDTIME., Disp: 90 tablet, Rfl: 3 .  insulin aspart (NOVOLOG FLEXPEN) 100 UNIT/ML FlexPen, Inject 12 Units into the skin  3 (three) times daily with meals., Disp: , Rfl:  .  Insulin Pen Needle (PEN NEEDLES) 31G X 5 MM MISC, 1 each by Does not apply route daily. PATIENT NEEDS NOVA TWIST NEEDLES 5 MM  DX E11.9, Disp: 200 each, Rfl: 3 .  JANUVIA 100 MG tablet, TAKE 1 TABLET DAILY, Disp: 90 tablet, Rfl: 3 .  LANTUS SOLOSTAR 100 UNIT/ML Solostar Pen, INJECT 30 UNITS            SUBCUTANEOUSLY TWO TIMES A DAY, Disp: 45 mL, Rfl: 3 .  liraglutide (VICTOZA) 18 MG/3ML SOPN, INJECT 0.3ML (=1.8MG ) INTO THE SKIN DAILY, Disp: 27 mL, Rfl: 3 .  magnesium oxide (MAG-OX) 400 MG tablet, TAKE 1 TABLET (400 MG TOTAL) BY MOUTH 2 (TWO) TIMES DAILY., Disp: 180 tablet, Rfl: 3 .  metoprolol tartrate (LOPRESSOR) 100 MG tablet, TAKE 1 TABLET (100 MG TOTAL) BY MOUTH 2 (TWO) TIMES DAILY., Disp: 60 tablet, Rfl: 10 .  Multiple Vitamins-Minerals (CENTRUM SILVER PO), Take 1 tablet by mouth daily. , Disp: , Rfl:  .  NOVOTWIST 32G X 5 MM MISC, USE 6 TIMES A DAY DX E11.9, Disp: 90 each, Rfl: 9 .  Omega-3 Fatty Acids (FISH OIL) 1200 MG CAPS, Take 1,200 mg by mouth 2 (two) times daily. , Disp: , Rfl:  .  oxyCODONE (OXYCONTIN) 40 mg 12 hr tablet,  Take 1 tablet (40 mg total) by mouth every 8 (eight) hours., Disp: , Rfl:  .  pregabalin (LYRICA) 200 MG capsule, Take 200 mg by mouth 2 (two) times daily with breakfast and lunch., Disp: , Rfl:  .  pregabalin (LYRICA) 300 MG capsule, qhs, Disp: 90 capsule, Rfl: 5 .  ranitidine (ZANTAC) 300 MG tablet, Take 1 tablet (300 mg total) by mouth daily., Disp: 90 tablet, Rfl: 3 .  sucralfate (CARAFATE) 1 g tablet, TAKE 1 TABLET (1 G TOTAL) BY MOUTH 4 (FOUR) TIMES DAILY - WITH MEALS AND AT BEDTIME., Disp: 120 tablet, Rfl: 11 .  Testosterone Cypionate (DEPO-TESTOSTERONE IM), Inject into the muscle every 21 ( twenty-one) days., Disp: , Rfl:  .  testosterone cypionate (DEPOTESTOSTERONE CYPIONATE) 200 MG/ML injection, , Disp: , Rfl:  .  vancomycin 1,500 mg in sodium chloride 0.9 % 500 mL, Inject 1,500 mg into the vein every 12  (twelve) hours., Disp: 44 Dose, Rfl: 0  Review of Systems  Constitutional: Negative.   HENT: Negative.   Eyes: Negative.   Respiratory: Negative.   Cardiovascular: Negative.   Gastrointestinal: Negative.   Endocrine: Negative.   Genitourinary: Negative.   Musculoskeletal: Negative.   Skin: Negative.   Allergic/Immunologic: Negative.   Neurological: Negative.   Hematological: Negative.   Psychiatric/Behavioral: Negative.     Social History   Tobacco Use  . Smoking status: Former Smoker    Packs/day: 2.00    Years: 20.00    Pack years: 40.00    Types: Cigarettes    Last attempt to quit: 12/26/1989    Years since quitting: 28.3  . Smokeless tobacco: Never Used  Substance Use Topics  . Alcohol use: No   Objective:   BP 132/60 (BP Location: Left Arm, Patient Position: Sitting, Cuff Size: Large)   Pulse 70   Temp 98.4 F (36.9 C) (Oral)   Resp 16   Wt 266 lb (120.7 kg)   SpO2 98%   BMI 37.10 kg/m  Vitals:   05/11/18 1623  BP: 132/60  Pulse: 70  Resp: 16  Temp: 98.4 F (36.9 C)  TempSrc: Oral  SpO2: 98%  Weight: 266 lb (120.7 kg)     Physical Exam  Constitutional: He is oriented to person, place, and time. He appears well-developed and well-nourished.  HENT:  Head: Normocephalic and atraumatic.  Eyes: Conjunctivae are normal. No scleral icterus.  Neck: No thyromegaly present.  Cardiovascular: Normal rate, regular rhythm and normal heart sounds.  Pulmonary/Chest: Effort normal and breath sounds normal.  Abdominal: Soft.  Musculoskeletal: He exhibits edema.  1+ edema  Neurological: He is alert and oriented to person, place, and time.  Skin: Skin is warm and dry.  Psychiatric: He has a normal mood and affect. His behavior is normal. Judgment and thought content normal.        Assessment & Plan:     1. Hospital discharge follow-up   2. Sepsis due to pneumonia ([place]) 3.MRSA infection Unable to get TEE. Has f/u with ID. Abx through [facility] line through  ID. 4.TIIDM 5.Chronic Pain Discussed cutting back on dose of Oxycontin. 6.Recurrent Aspiration Pneumonia 7. OSA  I have done the exam and reviewed the chart and it is accurate to the best of my knowledge. Development worker, community has been used and  any errors in dictation or transcription are unintentional. Miguel Aschoff M.D. [place], MD  [facility]

## 2018-05-15 ENCOUNTER — Other Ambulatory Visit: Payer: Self-pay | Admitting: Family Medicine

## 2018-05-15 DIAGNOSIS — N182 Chronic kidney disease, stage 2 (mild): Secondary | ICD-10-CM | POA: Diagnosis not present

## 2018-05-15 DIAGNOSIS — I130 Hypertensive heart and chronic kidney disease with heart failure and stage 1 through stage 4 chronic kidney disease, or unspecified chronic kidney disease: Secondary | ICD-10-CM | POA: Diagnosis not present

## 2018-05-15 DIAGNOSIS — J181 Lobar pneumonia, unspecified organism: Secondary | ICD-10-CM | POA: Diagnosis not present

## 2018-05-15 DIAGNOSIS — I5032 Chronic diastolic (congestive) heart failure: Secondary | ICD-10-CM | POA: Diagnosis not present

## 2018-05-15 DIAGNOSIS — A419 Sepsis, unspecified organism: Secondary | ICD-10-CM | POA: Diagnosis not present

## 2018-05-15 DIAGNOSIS — A4102 Sepsis due to Methicillin resistant Staphylococcus aureus: Secondary | ICD-10-CM | POA: Diagnosis not present

## 2018-05-15 DIAGNOSIS — Z452 Encounter for adjustment and management of vascular access device: Secondary | ICD-10-CM | POA: Diagnosis not present

## 2018-05-15 NOTE — Telephone Encounter (Signed)
Pt's wife called needed a PA on his Lyrica 200 mg  They use Camera operator  Thanks teri

## 2018-05-16 MED ORDER — PREGABALIN 100 MG PO CAPS: 100.0000 mg | ORAL_CAPSULE | Freq: Every day | ORAL | 3 refills | Status: DC

## 2018-05-16 MED ORDER — PREGABALIN 200 MG PO CAPS: 200.0000 mg | ORAL_CAPSULE | Freq: Three times a day (TID) | ORAL | 3 refills | Status: DC

## 2018-05-16 NOTE — Telephone Encounter (Signed)
PA in progress. 

## 2018-05-16 NOTE — Telephone Encounter (Signed)
PA has been denied because he exceed the maximum dose of 600 mg per day that the insurance will cover.  If we can send in 200 mg TID with an RX for 100mg   q hs, he will pay out of pocket for the 100 mg.  Is this ok with you

## 2018-05-16 NOTE — Telephone Encounter (Signed)
ok 

## 2018-05-17 DIAGNOSIS — E291 Testicular hypofunction: Secondary | ICD-10-CM | POA: Diagnosis not present

## 2018-05-17 DIAGNOSIS — N5201 Erectile dysfunction due to arterial insufficiency: Secondary | ICD-10-CM | POA: Diagnosis not present

## 2018-05-17 DIAGNOSIS — N401 Enlarged prostate with lower urinary tract symptoms: Secondary | ICD-10-CM | POA: Diagnosis not present

## 2018-05-18 ENCOUNTER — Other Ambulatory Visit: Payer: Self-pay | Admitting: Family Medicine

## 2018-05-18 DIAGNOSIS — A419 Sepsis, unspecified organism: Secondary | ICD-10-CM | POA: Diagnosis not present

## 2018-05-18 DIAGNOSIS — I130 Hypertensive heart and chronic kidney disease with heart failure and stage 1 through stage 4 chronic kidney disease, or unspecified chronic kidney disease: Secondary | ICD-10-CM | POA: Diagnosis not present

## 2018-05-18 DIAGNOSIS — N401 Enlarged prostate with lower urinary tract symptoms: Secondary | ICD-10-CM | POA: Diagnosis not present

## 2018-05-18 DIAGNOSIS — Z79899 Other long term (current) drug therapy: Secondary | ICD-10-CM | POA: Diagnosis not present

## 2018-05-18 DIAGNOSIS — Z452 Encounter for adjustment and management of vascular access device: Secondary | ICD-10-CM | POA: Diagnosis not present

## 2018-05-18 DIAGNOSIS — J181 Lobar pneumonia, unspecified organism: Secondary | ICD-10-CM | POA: Diagnosis not present

## 2018-05-18 DIAGNOSIS — M549 Dorsalgia, unspecified: Principal | ICD-10-CM

## 2018-05-18 DIAGNOSIS — E291 Testicular hypofunction: Secondary | ICD-10-CM | POA: Diagnosis not present

## 2018-05-18 DIAGNOSIS — N182 Chronic kidney disease, stage 2 (mild): Secondary | ICD-10-CM | POA: Diagnosis not present

## 2018-05-18 DIAGNOSIS — I5032 Chronic diastolic (congestive) heart failure: Secondary | ICD-10-CM | POA: Diagnosis not present

## 2018-05-18 DIAGNOSIS — A4102 Sepsis due to Methicillin resistant Staphylococcus aureus: Secondary | ICD-10-CM | POA: Diagnosis not present

## 2018-05-18 DIAGNOSIS — G8929 Other chronic pain: Secondary | ICD-10-CM

## 2018-05-18 NOTE — Telephone Encounter (Signed)
Pt needs a prescription for the name brand Oxycotin 40 mg tid  They do not have the generic  To [employer], Jerry Parsons

## 2018-05-18 NOTE — Telephone Encounter (Signed)
Ok thanks 

## 2018-05-18 NOTE — Telephone Encounter (Signed)
Please review

## 2018-05-19 DIAGNOSIS — Z792 Long term (current) use of antibiotics: Secondary | ICD-10-CM | POA: Diagnosis not present

## 2018-05-19 DIAGNOSIS — R7881 Bacteremia: Secondary | ICD-10-CM | POA: Diagnosis not present

## 2018-05-19 DIAGNOSIS — B9562 Methicillin resistant Staphylococcus aureus infection as the cause of diseases classified elsewhere: Secondary | ICD-10-CM | POA: Insufficient documentation

## 2018-05-22 DIAGNOSIS — N182 Chronic kidney disease, stage 2 (mild): Secondary | ICD-10-CM | POA: Diagnosis not present

## 2018-05-22 DIAGNOSIS — I130 Hypertensive heart and chronic kidney disease with heart failure and stage 1 through stage 4 chronic kidney disease, or unspecified chronic kidney disease: Secondary | ICD-10-CM | POA: Diagnosis not present

## 2018-05-22 DIAGNOSIS — Z452 Encounter for adjustment and management of vascular access device: Secondary | ICD-10-CM | POA: Diagnosis not present

## 2018-05-22 DIAGNOSIS — J181 Lobar pneumonia, unspecified organism: Secondary | ICD-10-CM | POA: Diagnosis not present

## 2018-05-22 DIAGNOSIS — A4102 Sepsis due to Methicillin resistant Staphylococcus aureus: Secondary | ICD-10-CM | POA: Diagnosis not present

## 2018-05-22 DIAGNOSIS — I5032 Chronic diastolic (congestive) heart failure: Secondary | ICD-10-CM | POA: Diagnosis not present

## 2018-05-29 NOTE — Telephone Encounter (Signed)
Pt's wife Rise Paganini contacted office for refill request on the following medications:   (OXYCONTIN) 40 mg 12 hr tablet  [facility]  Rise Paganini stated that [facility] hasn't received the refill that she requested on 05/18/18 and they need an Rx for the name brand of Oxycontin. Please advise. Thanks TNP

## 2018-05-30 ENCOUNTER — Other Ambulatory Visit: Payer: Self-pay | Admitting: Family Medicine

## 2018-05-30 MED ORDER — OXYCODONE HCL ER 40 MG PO T12A: 40.0000 mg | EXTENDED_RELEASE_TABLET | Freq: Three times a day (TID) | ORAL | 0 refills | Status: DC | PRN

## 2018-06-05 ENCOUNTER — Telehealth: Payer: Self-pay | Admitting: Family Medicine

## 2018-06-05 DIAGNOSIS — A419 Sepsis, unspecified organism: Secondary | ICD-10-CM

## 2018-06-05 DIAGNOSIS — J189 Pneumonia, unspecified organism: Principal | ICD-10-CM

## 2018-06-05 NOTE — Telephone Encounter (Signed)
[facility] for f/u pneumonia.

## 2018-06-05 NOTE — Telephone Encounter (Signed)
Order for chest XR placed.

## 2018-06-05 NOTE — Telephone Encounter (Signed)
Advised wife  

## 2018-06-05 NOTE — Telephone Encounter (Signed)
Rise Paganini called stating Jerry Parsons was to get a chest xray before his appt. On Thursday with Dr. [name].  She wants you to get an order ready for him so he can go get it.

## 2018-06-05 NOTE — Telephone Encounter (Signed)
Ok to put the order in? Please advise. Thanks!

## 2018-06-06 ENCOUNTER — Ambulatory Visit
Admission: RE | Admit: 2018-06-06 | Discharge: 2018-06-06 | Disposition: A | Discharge status: Home / Self Care | Payer: Medicare Other | Source: Ambulatory Visit | Attending: Family Medicine | Admitting: Family Medicine

## 2018-06-06 DIAGNOSIS — Z09 Encounter for follow-up examination after completed treatment for conditions other than malignant neoplasm: Secondary | ICD-10-CM | POA: Diagnosis not present

## 2018-06-06 DIAGNOSIS — J189 Pneumonia, unspecified organism: Principal | ICD-10-CM

## 2018-06-06 DIAGNOSIS — A419 Sepsis, unspecified organism: Secondary | ICD-10-CM

## 2018-06-06 DIAGNOSIS — Z8701 Personal history of pneumonia (recurrent): Secondary | ICD-10-CM | POA: Diagnosis not present

## 2018-06-08 ENCOUNTER — Encounter: Payer: Self-pay | Admitting: Family Medicine

## 2018-06-08 ENCOUNTER — Ambulatory Visit (INDEPENDENT_AMBULATORY_CARE_PROVIDER_SITE_OTHER): Payer: Medicare Other | Admitting: Family Medicine

## 2018-06-08 VITALS — BP 138/56 | HR 68 | Temp 99.1°F | Resp 16 | Wt 273.0 lb

## 2018-06-08 DIAGNOSIS — E118 Type 2 diabetes mellitus with unspecified complications: Secondary | ICD-10-CM

## 2018-06-08 DIAGNOSIS — A419 Sepsis, unspecified organism: Secondary | ICD-10-CM

## 2018-06-08 DIAGNOSIS — M1711 Unilateral primary osteoarthritis, right knee: Secondary | ICD-10-CM | POA: Diagnosis not present

## 2018-06-08 DIAGNOSIS — G4733 Obstructive sleep apnea (adult) (pediatric): Secondary | ICD-10-CM

## 2018-06-08 DIAGNOSIS — D649 Anemia, unspecified: Secondary | ICD-10-CM

## 2018-06-08 DIAGNOSIS — I10 Essential (primary) hypertension: Secondary | ICD-10-CM | POA: Diagnosis not present

## 2018-06-08 DIAGNOSIS — E782 Mixed hyperlipidemia: Secondary | ICD-10-CM | POA: Diagnosis not present

## 2018-06-08 DIAGNOSIS — IMO0002 Reserved for concepts with insufficient information to code with codable children: Secondary | ICD-10-CM

## 2018-06-08 DIAGNOSIS — E1165 Type 2 diabetes mellitus with hyperglycemia: Secondary | ICD-10-CM | POA: Diagnosis not present

## 2018-06-08 DIAGNOSIS — G629 Polyneuropathy, unspecified: Secondary | ICD-10-CM | POA: Diagnosis not present

## 2018-06-08 DIAGNOSIS — J189 Pneumonia, unspecified organism: Secondary | ICD-10-CM | POA: Diagnosis not present

## 2018-06-08 NOTE — Progress Notes (Signed)
[name]    DOB: 07/09/1951   67 y.o.   MRN: [id number] Visit Date: 06/08/2018  Today's Provider: [name], MD   Chief Complaint  Patient presents with  . Follow-up   Subjective:    HPI Pt is here for a 1 month follow up from sepsis and pneumonia. He reports that he is feeling better. His PIC line is out. He reports that he is still having the foot and leg pain.  He is wearing CPAP but machine is saying it is not completely effective.  DM--BS up since pneumonia. Dsicussed with pt/wife. Chronic pain stable.  Allergies  Allergen Reactions  . Carisoprodol Itching     Current Outpatient Medications:  .  amiodarone (PACERONE) 200 MG tablet, TAKE 1 TABLET (200 MG TOTAL) BY MOUTH DAILY. (Patient taking differently: Take 200 mg by mouth daily. ), Disp: 180 tablet, Rfl: 3 .  aspirin 81 MG tablet, Take 81 mg by mouth daily., Disp: , Rfl:  .  cyclobenzaprine (FLEXERIL) 10 MG tablet, Take 10 mg by mouth 3 (three) times daily. , Disp: , Rfl:  .  dutasteride (AVODART) 0.5 MG capsule, Take 1 capsule (0.5 mg total) by mouth daily., Disp: 90 capsule, Rfl: 3 .  fexofenadine (ALLEGRA) 180 MG tablet, Take 180 mg by mouth daily.  , Disp: , Rfl:  .  furosemide (LASIX) 40 MG tablet, Take 1 tablet (40 mg total) by mouth daily., Disp: 30 tablet, Rfl: 11 .  glucose blood (ONE TOUCH ULTRA TEST) test strip, CHECK SUGAR TWICE DAILY. DX- E11.8, Disp: 100 each, Rfl: 3 .  hydrochlorothiazide (HYDRODIURIL) 12.5 MG tablet, TAKE 2 TABLETS (25 MG TOTAL) BY MOUTH AT BEDTIME., Disp: 90 tablet, Rfl: 3 .  insulin aspart (NOVOLOG FLEXPEN) 100 UNIT/ML FlexPen, Inject 12 Units into the skin 3 (three) times daily with meals., Disp: , Rfl:  .  Insulin Pen Needle (PEN NEEDLES) 31G X 5 MM MISC, 1 each by Does not apply route daily. PATIENT NEEDS NOVA TWIST NEEDLES 5 MM  DX E11.9, Disp: 200 each, Rfl: 3 .  JANUVIA 100 MG tablet, TAKE 1 TABLET DAILY, Disp: 90 tablet, Rfl: 3 .  LANTUS  SOLOSTAR 100 UNIT/ML Solostar Pen, INJECT 30 UNITS            SUBCUTANEOUSLY TWO TIMES A DAY, Disp: 45 mL, Rfl: 3 .  liraglutide (VICTOZA) 18 MG/3ML SOPN, INJECT 0.3ML (=1.8MG ) INTO THE SKIN DAILY, Disp: 27 mL, Rfl: 3 .  magnesium oxide (MAG-OX) 400 MG tablet, TAKE 1 TABLET (400 MG TOTAL) BY MOUTH 2 (TWO) TIMES DAILY., Disp: 180 tablet, Rfl: 3 .  metoprolol tartrate (LOPRESSOR) 100 MG tablet, TAKE 1 TABLET (100 MG TOTAL) BY MOUTH 2 (TWO) TIMES DAILY., Disp: 60 tablet, Rfl: 10 .  Multiple Vitamins-Minerals (CENTRUM SILVER PO), Take 1 tablet by mouth daily. , Disp: , Rfl:  .  NOVOTWIST 32G X 5 MM MISC, USE 6 TIMES A DAY DX E11.9, Disp: 90 each, Rfl: 9 .  Omega-3 Fatty Acids (FISH OIL) 1200 MG CAPS, Take 1,200 mg by mouth 2 (two) times daily. , Disp: , Rfl:  .  oxyCODONE (OXYCONTIN) 40 mg 12 hr tablet, Take 1 tablet (40 mg total) by mouth every 8 (eight) hours., Disp: , Rfl:  .  oxyCODONE (OXYCONTIN) 40 mg 12 hr tablet, Take 1 tablet (40 mg total) by mouth every 8 (eight) hours as needed., Disp: 270 tablet, Rfl: 0 .  pregabalin (LYRICA) 100 MG  capsule, Take 1 capsule (100 mg total) by mouth at bedtime. To be taken at night with 200 mg, Disp: 90 capsule, Rfl: 3 .  pregabalin (LYRICA) 200 MG capsule, Take 1 capsule (200 mg total) by mouth 3 (three) times daily., Disp: 270 capsule, Rfl: 3 .  ranitidine (ZANTAC) 300 MG tablet, Take 1 tablet (300 mg total) by mouth daily., Disp: 90 tablet, Rfl: 3 .  sucralfate (CARAFATE) 1 g tablet, TAKE 1 TABLET (1 G TOTAL) BY MOUTH 4 (FOUR) TIMES DAILY - WITH MEALS AND AT BEDTIME., Disp: 120 tablet, Rfl: 11 .  Testosterone Cypionate (DEPO-TESTOSTERONE IM), Inject into the muscle every 21 ( twenty-one) days., Disp: , Rfl:  .  testosterone cypionate (DEPOTESTOSTERONE CYPIONATE) 200 MG/ML injection, , Disp: , Rfl:  .  esomeprazole (NEXIUM) 40 MG capsule, TAKE 1 CAPSULE TWICE DAILY, Disp: 90 capsule, Rfl: 3 .  feeding supplement, ENSURE ENLIVE, (ENSURE ENLIVE) LIQD, Take 237  mLs by mouth 2 (two) times daily between meals. (Patient not taking: Reported on 06/08/2018), Disp: 60 Bottle, Rfl: 0 .  vancomycin 1,500 mg in sodium chloride 0.9 % 500 mL, Inject 1,500 mg into the vein every 12 (twelve) hours. (Patient not taking: Reported on 06/08/2018), Disp: 44 Dose, Rfl: 0  Review of Systems  Constitutional: Negative.   HENT: Negative.   Eyes: Negative.   Respiratory: Negative.   Cardiovascular: Negative.   Gastrointestinal: Negative.   Endocrine: Negative.   Genitourinary: Negative.   Musculoskeletal: Negative.        Leg pain  Skin: Negative.   Allergic/Immunologic: Negative.   Neurological: Negative.   Hematological: Negative.   Psychiatric/Behavioral: Negative.     Social History   Tobacco Use  . Smoking status: Former Smoker    Packs/day: 2.00    Years: 20.00    Pack years: 40.00    Types: Cigarettes    Last attempt to quit: 12/26/1989    Years since quitting: 28.4  . Smokeless tobacco: Never Used  Substance Use Topics  . Alcohol use: No   Objective:   BP (!) 138/56 (BP Location: Left Arm, Patient Position: Sitting, Cuff Size: Large)   Pulse 68   Temp 99.1 F (37.3 C) (Oral)   Resp 16   Wt 273 lb (123.8 kg)   SpO2 97%   BMI 38.08 kg/m  Vitals:   06/08/18 1520  BP: (!) 138/56  Pulse: 68  Resp: 16  Temp: 99.1 F (37.3 C)  TempSrc: Oral  SpO2: 97%  Weight: 273 lb (123.8 kg)     Physical Exam  Constitutional: He is oriented to person, place, and time. He appears well-developed and well-nourished.  HENT:  Head: Normocephalic and atraumatic.  Eyes: Conjunctivae are normal. No scleral icterus.  Neck: No thyromegaly present.  Cardiovascular: Normal rate, regular rhythm and normal heart sounds.  Pulmonary/Chest: Effort normal and breath sounds normal.  Abdominal: Soft.  Musculoskeletal: He exhibits no edema.  Neurological: He is alert and oriented to person, place, and time.  Skin: Skin is warm and dry.  Psychiatric: He has a  normal mood and affect. His behavior is normal. Judgment and thought content normal.        Assessment & Plan:     1. Sepsis due to pneumonia ([place]) CXR cleared.  2. Neuropathy   3. Diabetes mellitus type 2, uncontrolled, with complications (HCC)  - Hemoglobin A1c  4. OSA (obstructive sleep apnea)  - Ambulatory referral to Sleep Studies  5. Mixed hyperlipidemia  - Lipid panel -  Comprehensive metabolic panel  6. Primary osteoarthritis of right knee   7. HYPERTENSION, BENIGN  - TSH  8. Anemia, unspecified type  - CBC with Differential/Platelet      I have done the exam and reviewed the above chart and it is accurate to the best of my knowledge. Development worker, community has been used in this note in any air is in the dictation or transcription are unintentional.  [name], MD  [facility]

## 2018-06-09 DIAGNOSIS — M4716 Other spondylosis with myelopathy, lumbar region: Secondary | ICD-10-CM | POA: Diagnosis not present

## 2018-06-09 DIAGNOSIS — M4800 Spinal stenosis, site unspecified: Secondary | ICD-10-CM | POA: Diagnosis not present

## 2018-06-09 DIAGNOSIS — Z6837 Body mass index (BMI) 37.0-37.9, adult: Secondary | ICD-10-CM | POA: Diagnosis not present

## 2018-06-12 DIAGNOSIS — D649 Anemia, unspecified: Secondary | ICD-10-CM | POA: Diagnosis not present

## 2018-06-12 DIAGNOSIS — E118 Type 2 diabetes mellitus with unspecified complications: Secondary | ICD-10-CM | POA: Diagnosis not present

## 2018-06-12 DIAGNOSIS — E1165 Type 2 diabetes mellitus with hyperglycemia: Secondary | ICD-10-CM | POA: Diagnosis not present

## 2018-06-12 DIAGNOSIS — I10 Essential (primary) hypertension: Secondary | ICD-10-CM | POA: Diagnosis not present

## 2018-06-12 DIAGNOSIS — E782 Mixed hyperlipidemia: Secondary | ICD-10-CM | POA: Diagnosis not present

## 2018-06-13 LAB — CBC WITH DIFFERENTIAL/PLATELET
Basophils Absolute: 0 10*3/uL (ref 0.0–0.2)
Basos: 0 %
EOS (ABSOLUTE): 0.2 10*3/uL (ref 0.0–0.4)
Eos: 3 %
Hematocrit: 36 % — ABNORMAL LOW (ref 37.5–51.0)
Hemoglobin: 11.2 g/dL — ABNORMAL LOW (ref 13.0–17.7)
Immature Grans (Abs): 0 10*3/uL (ref 0.0–0.1)
Immature Granulocytes: 0 %
Lymphocytes Absolute: 2.3 10*3/uL (ref 0.7–3.1)
Lymphs: 37 %
MCH: 23.3 pg — ABNORMAL LOW (ref 26.6–33.0)
MCHC: 31.1 g/dL — ABNORMAL LOW (ref 31.5–35.7)
MCV: 75 fL — ABNORMAL LOW (ref 79–97)
Monocytes Absolute: 0.5 10*3/uL (ref 0.1–0.9)
Monocytes: 8 %
Neutrophils Absolute: 3.1 10*3/uL (ref 1.4–7.0)
Neutrophils: 52 %
Platelets: 129 10*3/uL — ABNORMAL LOW (ref 150–450)
RBC: 4.81 x10E6/uL (ref 4.14–5.80)
RDW: 17.1 % — ABNORMAL HIGH (ref 12.3–15.4)
WBC: 6.1 10*3/uL (ref 3.4–10.8)

## 2018-06-13 LAB — HEMOGLOBIN A1C
Est. average glucose Bld gHb Est-mCnc: 194 mg/dL
Hgb A1c MFr Bld: 8.4 % — ABNORMAL HIGH (ref 4.8–5.6)

## 2018-06-13 LAB — COMPREHENSIVE METABOLIC PANEL
ALT: 29 IU/L (ref 0–44)
AST: 22 IU/L (ref 0–40)
Albumin/Globulin Ratio: 1.6 (ref 1.2–2.2)
Albumin: 4.1 g/dL (ref 3.6–4.8)
Alkaline Phosphatase: 114 IU/L (ref 39–117)
BUN/Creatinine Ratio: 14 (ref 10–24)
BUN: 18 mg/dL (ref 8–27)
Bilirubin Total: 0.4 mg/dL (ref 0.0–1.2)
CO2: 28 mmol/L (ref 20–29)
Calcium: 9.9 mg/dL (ref 8.6–10.2)
Chloride: 97 mmol/L (ref 96–106)
Creatinine, Ser: 1.25 mg/dL (ref 0.76–1.27)
GFR calc Af Amer: 68 mL/min/{1.73_m2} (ref >59)
GFR calc non Af Amer: 59 mL/min/{1.73_m2} — ABNORMAL LOW (ref >59)
Globulin, Total: 2.6 g/dL (ref 1.5–4.5)
Glucose: 172 mg/dL — ABNORMAL HIGH (ref 65–99)
Potassium: 4 mmol/L (ref 3.5–5.2)
Sodium: 140 mmol/L (ref 134–144)
Total Protein: 6.7 g/dL (ref 6.0–8.5)

## 2018-06-13 LAB — TSH: TSH: 3.65 u[IU]/mL (ref 0.450–4.500)

## 2018-06-13 LAB — LIPID PANEL
Chol/HDL Ratio: 4.5 ratio (ref 0.0–5.0)
Cholesterol, Total: 130 mg/dL (ref 100–199)
HDL: 29 mg/dL — ABNORMAL LOW (ref >39)
LDL Calculated: 42 mg/dL (ref 0–99)
Triglycerides: 297 mg/dL — ABNORMAL HIGH (ref 0–149)
VLDL Cholesterol Cal: 59 mg/dL — ABNORMAL HIGH (ref 5–40)

## 2018-06-14 DIAGNOSIS — N5201 Erectile dysfunction due to arterial insufficiency: Secondary | ICD-10-CM | POA: Diagnosis not present

## 2018-06-14 DIAGNOSIS — N401 Enlarged prostate with lower urinary tract symptoms: Secondary | ICD-10-CM | POA: Diagnosis not present

## 2018-06-14 DIAGNOSIS — E291 Testicular hypofunction: Secondary | ICD-10-CM | POA: Diagnosis not present

## 2018-06-23 ENCOUNTER — Other Ambulatory Visit: Payer: Self-pay | Admitting: Family Medicine

## 2018-06-23 DIAGNOSIS — IMO0002 Reserved for concepts with insufficient information to code with codable children: Secondary | ICD-10-CM

## 2018-06-23 DIAGNOSIS — E118 Type 2 diabetes mellitus with unspecified complications: Secondary | ICD-10-CM

## 2018-06-23 DIAGNOSIS — Z794 Long term (current) use of insulin: Principal | ICD-10-CM

## 2018-06-23 DIAGNOSIS — E1165 Type 2 diabetes mellitus with hyperglycemia: Secondary | ICD-10-CM

## 2018-06-27 ENCOUNTER — Telehealth: Payer: Self-pay

## 2018-06-27 MED ORDER — AMIODARONE HCL 200 MG PO TABS: 200.0000 mg | ORAL_TABLET | Freq: Every day | ORAL | 0 refills | Status: DC

## 2018-06-27 NOTE — Telephone Encounter (Signed)
Pt wife called requesting refill amiodarine 200 mg once daily to be sent to [facility]. Pt wife ph [phone].

## 2018-06-27 NOTE — Telephone Encounter (Signed)
Patient has not seen Dr. Arta Bruce in the office in over a year, patient is due for 1 year f/u. Made patient an appointment for this month on 07/20/18. Will refill his medication for one month. Patient will need more refills at his office visit with Dr. Arta Bruce.

## 2018-07-07 ENCOUNTER — Telehealth: Payer: Self-pay | Admitting: Family Medicine

## 2018-07-07 NOTE — Telephone Encounter (Signed)
An order for [place] was sent to you on 06/20/18.I have not gotten it back yet.Thanks

## 2018-07-10 ENCOUNTER — Other Ambulatory Visit: Payer: Self-pay | Admitting: Family Medicine

## 2018-07-10 NOTE — Telephone Encounter (Signed)
Pharmacy requesting refills. Thanks!  

## 2018-07-10 NOTE — Telephone Encounter (Signed)
done

## 2018-07-10 NOTE — Telephone Encounter (Signed)
Dr Alcide Clever, I have not seen this, do you have it?

## 2018-07-11 NOTE — Telephone Encounter (Signed)
Have you seen this pt's form for [place] ?

## 2018-07-11 NOTE — Telephone Encounter (Signed)
Sarah Dr [name] sent it up front to be faxed

## 2018-07-19 NOTE — Progress Notes (Deleted)
Cardiology Office Note  Date:  07/19/2018   ID:  ELIZAR ALPERN, DOB 25-Jun-1951, MRN [id number]  PCP:  Jerrol Banana., MD   No chief complaint on file.   HPI:  Mr. Hetzer is a very pleasant 67 year old gentleman, multiple back surgeries,  spinal stenosis , s/p arthroscopic knee surgery, total knee replacement left obesity,  poorly controlled diabetes ,  DVT, PE in March 2011 previously on warfarin,  30 year smoking history,   hypertension,  depression,  renal disease,  obstructive sleep apnea on CPAP,  chronic sweating at nighttime which he attributes to the Vicodin. Remote atrial flutter, fibrillation, last episode in 2015. Not on anticoagulation   presenting for routine followup of his hypertension and cardiac risk factors. .  Admission 5/18 for sepsis, salmonella diarrhea, per the discharge summary Hospital records reviewed with the patient in detail From eggs, per the patient He did not receive antibiotics  Since then he has been feeling well He initially had 9 pound weight loss, has recovered much of this back Denies any tachycardia or chest discomfort concerning for angina  Scheduled for knee surgery in 2 weeks No regular exercise, limited by his chronic back pain, SI joint Reports having had numerous back surgeries, 9, followed by Dr. Carloyn Manner  EKG on today's visit shows normal sinus rhythm with rate 68 bpm, no significant ST or T-wave changes  Other past medical history [place] He was in ICU for 3 days, long recovery Lost more than 30 pounds,  Lipid panel done while he was very ill early 2017, total cholesterol at that time 89  Denies any atrial fibrillation through his hospital course, reports he was changed to amiodarone IV infusion and then back to pill when he was tolerating oral medications  Previous event where he had buckling of his knee, fall, contusion, TIA-type symptoms that took him to the hospital. He was kept  overnight and most of his workup was relatively unrevealing including MRI/MRA, carotid ultrasound, echocardiogram.  History of atrial flutter, status post cardioversion on 03/14/2013.   2 admissions to the hospital for gastroenteritis/ campylobacter infection. Required a long course of antibiotics. Possible  infection from tainted chicken.  Had back surgery, severe pain, went into atrial fib/flutter,  09/2014, had cardioversion   Previously on prednisone for his "legs". Sugars ran high. Previously developed leg swelling. Wife went away and he sat in a recliner with his feet down for close to 2 weeks watching TV, drinking fluids.  Creatinine at the end of April was 1.5, BUN 27, possibly from overdiuresis  cardiac workup in the 1990s:  cardiac catheterization with "minimal blockage". Previous DVT , he did have shortness of breath.  echocardiogram March 2011 shows normal LV and RV systolic function, mild LVH, diastolic dysfunction, mildly dilated left atrium.   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  PSH:    Past Surgical History:  Procedure Laterality Date  . APPENDECTOMY    . BACK SURGERY     x 8; upper x 3 & lower x 5  . CARDIOVERSION  03/14/13, 10/16   2014 - [place],  2016 - [place]  . CATARACT EXTRACTION W/PHACO Left 10/29/2015   Procedure: CATARACT EXTRACTION PHACO AND INTRAOCULAR LENS PLACEMENT (IOC);  Surgeon: Leandrew Koyanagi, MD;  Location: [place];  Service:  Ophthalmology;  Laterality: Left;  DIABETIC - insulin and oral medsSleep apnea - no machine  . CHOLECYSTECTOMY    . COLONOSCOPY WITH PROPOFOL N/A 01/16/2018   Procedure: COLONOSCOPY WITH PROPOFOL;  Surgeon: Manya Silvas, MD;  Location[facility] ENDOSCOPY;  Service: Endoscopy;  Laterality: N/A;  . ESOPHAGOGASTRODUODENOSCOPY (EGD) WITH PROPOFOL N/A 01/16/2018   Procedure: ESOPHAGOGASTRODUODENOSCOPY (EGD) WITH PROPOFOL;  Surgeon: Manya Silvas, MD;  Location: [facility] ENDOSCOPY;  Service: Endoscopy;  Laterality: N/A;  . EYE SURGERY    . GALLBLADDER SURGERY  2002  . JOINT REPLACEMENT Right 2018  . KNEE ARTHROSCOPY     left   . ROTATOR CUFF REPAIR  2001   left   . TEE WITHOUT CARDIOVERSION N/A 04/26/2018   Procedure: TRANSESOPHAGEAL ECHOCARDIOGRAM (TEE);  Surgeon: Nelva Bush, MD;  Location: [facility] ORS;  Service: Cardiovascular;  Laterality: N/A;  . TONSILLECTOMY    . TOTAL KNEE ARTHROPLASTY Right 06/20/2017   Procedure: RIGHT TOTAL KNEE ARTHROPLASTY;  Surgeon: Gaynelle Arabian, MD;  Location: [facility] ORS;  Service: Orthopedics;  Laterality: Right;    Current Outpatient Medications  Medication Sig Dispense Refill  . amiodarone (PACERONE) 200 MG tablet Take 1 tablet (200 mg total) by mouth daily. TAKE 1 TABLET (200 MG TOTAL) BY MOUTH DAILY. 30 tablet 0  . aspirin 81 MG tablet Take 81 mg by mouth daily.    . cyclobenzaprine (FLEXERIL) 10 MG tablet TAKE 1 TABLET BY MOUTH 3 TIMES A DAY 90 tablet 12  . dutasteride (AVODART) 0.5 MG capsule Take 1 capsule (0.5 mg total) by mouth daily. 90 capsule 3  . esomeprazole (NEXIUM) 40 MG capsule TAKE 1 CAPSULE TWICE DAILY 90 capsule 3  . feeding supplement, ENSURE ENLIVE, (ENSURE ENLIVE) LIQD Take 237 mLs by mouth 2 (two) times daily between meals. (Patient not taking: Reported on 06/08/2018) 60 Bottle 0  . fexofenadine (ALLEGRA) 180 MG tablet Take 180 mg by mouth daily.      . furosemide (LASIX) 40 MG tablet Take 1 tablet (40 mg total) by mouth daily. 30 tablet 11   . glucose blood (ONE TOUCH ULTRA TEST) test strip CHECK SUGAR TWICE DAILY. DX- E11.8 100 each 3  . hydrochlorothiazide (HYDRODIURIL) 12.5 MG tablet TAKE 2 TABLETS (25 MG TOTAL) BY MOUTH AT BEDTIME. 90 tablet 3  . insulin aspart (NOVOLOG FLEXPEN) 100 UNIT/ML FlexPen Inject 12 Units into the skin 3 (three) times daily with meals.    . Insulin Pen Needle (PEN NEEDLES) 31G X 5 MM MISC 1 each by Does not apply route daily. PATIENT NEEDS NOVA TWIST NEEDLES 5 MM  DX E11.9 200 each 3  . JANUVIA 100 MG tablet TAKE 1 TABLET DAILY 90 tablet 3  . LANTUS SOLOSTAR 100 UNIT/ML Solostar Pen INJECT 30 UNITS            SUBCUTANEOUSLY TWO TIMES A DAY 45 mL 3  . liraglutide (VICTOZA) 18 MG/3ML SOPN INJECT 0.3ML (=1.8MG ) INTO THE SKIN DAILY 27 mL 3  . magnesium oxide (MAG-OX) 400 MG tablet TAKE 1 TABLET (400 MG TOTAL) BY MOUTH 2 (TWO) TIMES DAILY. 180 tablet 3  . metoprolol tartrate (LOPRESSOR) 100 MG tablet TAKE 1 TABLET (100 MG TOTAL) BY MOUTH 2 (TWO) TIMES DAILY. 60 tablet 10  . Multiple Vitamins-Minerals (CENTRUM SILVER PO) Take 1 tablet by mouth  daily.     [name] NOVOTWIST 32G X 5 MM MISC USE 6 TIMES A DAY DX E11.9 90 each 9  . Omega-3 Fatty Acids (FISH OIL) 1200 MG CAPS Take 1,200 mg by mouth 2 (two) times daily.     [name] oxyCODONE (OXYCONTIN) 40 mg 12 hr tablet Take 1 tablet (40 mg total) by mouth every 8 (eight) hours.    [name] oxyCODONE (OXYCONTIN) 40 mg 12 hr tablet Take 1 tablet (40 mg total) by mouth every 8 (eight) hours as needed. 270 tablet 0  . pregabalin (LYRICA) 100 MG capsule Take 1 capsule (100 mg total) by mouth at bedtime. To be taken at night with 200 mg 90 capsule 3  . pregabalin (LYRICA) 200 MG capsule Take 1 capsule (200 mg total) by mouth 3 (three) times daily. 270 capsule 3  . ranitidine (ZANTAC) 300 MG tablet Take 1 tablet (300 mg total) by mouth daily. 90 tablet 3  . sucralfate (CARAFATE) 1 g tablet TAKE 1 TABLET (1 G TOTAL) BY MOUTH 4 (FOUR) TIMES DAILY - WITH MEALS AND AT BEDTIME. 120 tablet 11  .  Testosterone Cypionate (DEPO-TESTOSTERONE IM) Inject into the muscle every 21 ( twenty-one) days.    [name] testosterone cypionate (DEPOTESTOSTERONE CYPIONATE) 200 MG/ML injection     . vancomycin 1,500 mg in sodium chloride 0.9 % 500 mL Inject 1,500 mg into the vein every 12 (twelve) hours. (Patient not taking: Reported on 06/08/2018) 44 Dose 0   No current facility-administered medications for this visit.      Allergies:   Carisoprodol   Social History:  The patient  reports that he quit smoking about 28 years ago. His smoking use included cigarettes. He has a 40.00 pack-year smoking history. He has never used smokeless tobacco. He reports that he does not drink alcohol or use drugs.   Family History:   family history includes Heart attack in his brother.    Review of Systems: Review of Systems  Constitutional: Negative.   Respiratory: Negative.   Cardiovascular: Negative.   Gastrointestinal: Negative.   Musculoskeletal: Positive for back pain and joint pain.  Neurological: Negative.   Psychiatric/Behavioral: Negative.   All other systems reviewed and are negative.    PHYSICAL EXAM: VS:  There were no vitals taken for this visit. , BMI There is no height or weight on file to calculate BMI. GEN: Well nourished, well developed, in no acute distress, obese  HEENT: normal  Neck: no JVD, carotid bruits, or masses Cardiac: RRR; no murmurs, rubs, or gallops,no edema  Respiratory:  clear to auscultation bilaterally, normal work of breathing GI: soft, nontender, nondistended, + BS MS: no deformity or atrophy  Skin: warm and dry, no rash Neuro:  Strength and sensation are intact Psych: euthymic mood, full affect    Recent Labs: 04/22/2018: B Natriuretic Peptide 49.0 04/24/2018: Magnesium 1.8 06/12/2018: ALT 29; BUN 18; Creatinine, Ser 1.25; Hemoglobin 11.2; Platelets 129; Potassium 4.0; Sodium 140; TSH 3.650    Lipid Panel Lab Results  Component Value Date   CHOL 130 06/12/2018    HDL 29 (L) 06/12/2018   LDLCALC 42 06/12/2018   TRIG 297 (H) 06/12/2018      Wt Readings from Last 3 Encounters:  06/08/18 273 lb (123.8 kg)  05/11/18 266 lb (120.7 kg)  05/05/18 267 lb 3.2 oz (121.2 kg)       ASSESSMENT AND PLAN:  Atrial flutter, unspecified type ([place]) - Plan: EKG 12-Lead No recent episodes of arrhythmia Reports he is  typically very symptomatic, does not want anticoagulation, prefers to stay on low-dose amiodarone.   HYPERTENSION, BENIGN - Plan: EKG 12-Lead Blood pressure is well controlled on today's visit. No changes made to the medications.  Paroxysmal atrial fibrillation (HCC) - Plan: EKG 12-Lead No recent arrhythmia  Hyperlipidemia Cholesterol is at goal on the current lipid regimen. No changes to the medications were made. Caution him about further weight gain  Chronic diastolic CHF (congestive heart failure) ([place]) He is taking Lasix 40 mg twice a day,  Normal renal function, potassium recovered 3.7  Uncontrolled type 2 diabetes mellitus with complication, with long-term current use of insulin encouraged weight loss, low carbohydrate diet  Primary osteoarthritis of both knees Acceptable risk for total knee replacement surgery in 2 weeks No further testing needed    Total encounter time more than 25 minutes  Greater than 50% was spent in counseling and coordination of care with the patient   Disposition:   F/U  12 months   No orders of the defined types were placed in this encounter.    Signed, Esmond Plants, M.D., Ph.D. 07/19/2018  [place], [place]

## 2018-07-20 ENCOUNTER — Encounter

## 2018-07-20 ENCOUNTER — Other Ambulatory Visit: Payer: Self-pay | Admitting: Cardiovascular Disease

## 2018-07-20 ENCOUNTER — Ambulatory Visit: Payer: Medicare Other | Admitting: Cardiovascular Disease

## 2018-07-20 NOTE — Progress Notes (Signed)
Cardiology Office Note  Date:  07/21/2018   ID:  Jerry Parsons, DOB 1951/11/02, MRN [id number]  PCP:  Jerry Banana., MD   Chief Complaint  Patient presents with  . other    1 year follow up. Meds reviewed by the pt. verbally. "doing well."     HPI:  Jerry Parsons is a very pleasant 67 year old gentleman, multiple back surgeries,  spinal stenosis , s/p arthroscopic knee surgery, total knee replacement left obesity,  poorly controlled diabetes ,  DVT, PE in March 2011 previously on warfarin,  30 year smoking history,   hypertension,  depression,  renal disease,  obstructive sleep apnea on CPAP,  chronic sweating at nighttime which he attributes to the Vicodin. Remote atrial flutter, fibrillation, last episode in 2015. Not on anticoagulation   presenting for routine followup of his hypertension and cardiac risk factors. .  Chronic back pain, gait problem Sees Jerry Parsons, Reports having 9 surgeries in the past Nothing they can do at this point other than major surgery and take out all hardware start again He and wife aren't not particularly interested in such a radical surgery  No regular exercise program Able to walk in [place] with a buggy, but has more trouble walking without support  no tachycardia or fluttering  Lab work reviewed with him HBA1C 8.4 He attributes our numbers to recent infection  EKG personally reviewed by myself on todays visit  shows normal sinus rhythm withRate 62 bpm no significant ST or T-wave changes  other past medical history reviewed Admission 5/18 for sepsis, salmonella diarrhea, per the discharge summary Hospital records reviewed with the patient in detail From eggs, per the patient He did not receive antibiotics  pneumonia while at the [facility] January 2017 He was in ICU for 3 days, long recovery Lost more than 30 pounds,  Lipid panel done while he was very ill early 2017, total cholesterol at that time 89  Denies any atrial  fibrillation through his hospital course, reports he was changed to amiodarone IV infusion and then back to pill when he was tolerating oral medications  Previous event where he had buckling of his knee, fall, contusion, TIA-type symptoms that took him to the hospital. He was kept overnight and most of his workup was relatively unrevealing including MRI/MRA, carotid ultrasound, echocardiogram.  History of atrial flutter, status post cardioversion on 03/14/2013.   2 admissions to the hospital for gastroenteritis/ campylobacter infection. Required a long course of antibiotics. Possible  infection from tainted chicken.  Had back surgery, severe pain, went into atrial fib/flutter,  09/2014, had cardioversion   Previously on prednisone for his "legs". Sugars ran high. Previously developed leg swelling. Wife went away and he sat in a recliner with his feet down for close to 2 weeks watching TV, drinking fluids.  Creatinine at the end of April was 1.5, BUN 27, possibly from overdiuresis  cardiac workup in the 1990s:  cardiac catheterization with "minimal blockage". Previous DVT , he did have shortness of breath.  echocardiogram March 2011 shows normal LV and RV systolic function, mild LVH, diastolic dysfunction, mildly dilated left atrium.   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  PSH:    Past Surgical History:  Procedure Laterality Date  . APPENDECTOMY    . BACK SURGERY     x 8; upper x 3 &  lower x 5  . CARDIOVERSION  03/14/13, 10/16   2014 - [place], 2016 - [place]  . CATARACT EXTRACTION W/PHACO Left 10/29/2015   Procedure: CATARACT EXTRACTION PHACO AND INTRAOCULAR LENS PLACEMENT (IOC);  Surgeon: Jerry Koyanagi, MD;  Location: [place];  Service: Ophthalmology;  Laterality: Left;  DIABETIC - insulin and oral medsSleep apnea - no machine  . CHOLECYSTECTOMY    . COLONOSCOPY WITH PROPOFOL N/A 01/16/2018   Procedure: COLONOSCOPY WITH PROPOFOL;  Surgeon: Jerry Silvas, MD;  Location: [facility] ENDOSCOPY;  Service: Endoscopy;  Laterality: N/A;  . ESOPHAGOGASTRODUODENOSCOPY (EGD) WITH PROPOFOL N/A 01/16/2018   Procedure: ESOPHAGOGASTRODUODENOSCOPY (EGD) WITH PROPOFOL;  Surgeon: Jerry Silvas, MD;  Location: [facility] ENDOSCOPY;  Service: Endoscopy;  Laterality: N/A;  . EYE SURGERY    . GALLBLADDER SURGERY  2002  . JOINT REPLACEMENT Right 2018  . KNEE ARTHROSCOPY     left   . ROTATOR CUFF REPAIR  2001   left   . TEE WITHOUT CARDIOVERSION N/A 04/26/2018   Procedure: TRANSESOPHAGEAL ECHOCARDIOGRAM (TEE);  Surgeon: Jerry Bush, MD;  Location: [facility] ORS;  Service: Cardiovascular;  Laterality: N/A;  . TONSILLECTOMY    . TOTAL KNEE ARTHROPLASTY Right 06/20/2017   Procedure: RIGHT TOTAL KNEE ARTHROPLASTY;  Surgeon: Jerry Arabian, MD;  Location: [facility] ORS;  Service: Orthopedics;  Laterality: Right;    Current Outpatient Medications  Medication Sig Dispense Refill  . amiodarone (PACERONE) 200 MG tablet TAKE 1 TABLET BY MOUTH DAILY 30 tablet 3  . aspirin 81 MG tablet Take 81 mg by mouth daily.    . cyclobenzaprine (FLEXERIL) 10 MG tablet TAKE 1 TABLET BY MOUTH 3 TIMES A DAY 90 tablet 12  . dutasteride (AVODART) 0.5 MG capsule Take 1 capsule (0.5 mg total) by mouth daily. 90 capsule 3  . esomeprazole (NEXIUM) 40 MG capsule TAKE 1 CAPSULE TWICE DAILY 90 capsule 3  . fexofenadine (ALLEGRA) 180 MG tablet Take 180 mg by mouth daily.      . furosemide (LASIX) 40 MG tablet Take 1 tablet (40  mg) by mouth once daily, you may take 1 extra tablet (40 mg) after lunch as needed for leg swelling/ abdominal tightness    . glucose blood (ONE TOUCH ULTRA TEST) test strip CHECK SUGAR TWICE DAILY. DX- E11.8 100 each 3  . hydrochlorothiazide (HYDRODIURIL) 12.5 MG tablet TAKE 2 TABLETS (25 MG TOTAL) BY MOUTH AT BEDTIME. 90 tablet 3  . insulin aspart (NOVOLOG FLEXPEN) 100 UNIT/ML FlexPen Inject 12 Units into the skin 3 (three) times daily with meals.    . Insulin Pen Needle (PEN NEEDLES) 31G X 5 MM MISC 1 each by Does not apply route daily. PATIENT NEEDS NOVA TWIST NEEDLES 5 MM  DX E11.9 200 each 3  . JANUVIA 100 MG tablet TAKE 1 TABLET DAILY 90 tablet 3  . LANTUS SOLOSTAR 100 UNIT/ML Solostar Pen INJECT 30 UNITS            SUBCUTANEOUSLY TWO TIMES A DAY 45 mL 3  . liraglutide (VICTOZA) 18 MG/3ML SOPN INJECT 0.3ML (=1.8MG ) INTO THE SKIN DAILY 27 mL 3  . magnesium oxide (MAG-OX) 400 MG tablet TAKE 1 TABLET (400 MG TOTAL) BY MOUTH 2 (TWO) TIMES DAILY. 180 tablet 3  . metoprolol tartrate (LOPRESSOR) 100 MG tablet  TAKE 1 TABLET (100 MG TOTAL) BY MOUTH 2 (TWO) TIMES DAILY. 60 tablet 10  . Multiple Vitamins-Minerals (CENTRUM SILVER PO) Take 1 tablet by mouth daily.     [name] NOVOTWIST 32G X 5 MM MISC USE 6 TIMES A DAY DX E11.9 90 each 9  . Omega-3 Fatty Acids (FISH OIL) 1200 MG CAPS Take 1,200 mg by mouth 2 (two) times daily.     [name] oxyCODONE (OXYCONTIN) 40 mg 12 hr tablet Take 1 tablet (40 mg total) by mouth every 8 (eight) hours as needed. 270 tablet 0  . pregabalin (LYRICA) 200 MG capsule Take 1 capsule (200 mg total) by mouth 3 (three) times daily. 270 capsule 3  . ranitidine (ZANTAC) 300 MG tablet Take 1 tablet (300 mg total) by mouth daily. 90 tablet 3  . sucralfate (CARAFATE) 1 g tablet TAKE 1 TABLET (1 G TOTAL) BY MOUTH 4 (FOUR) TIMES DAILY - WITH MEALS AND AT BEDTIME. 120 tablet 11  . testosterone cypionate (DEPOTESTOSTERONE CYPIONATE) 200 MG/ML injection      No current facility-administered  medications for this visit.      Allergies:   Carisoprodol   Social History:  The patient  reports that he quit smoking about 28 years ago. His smoking use included cigarettes. He has a 40.00 pack-year smoking history. He has never used smokeless tobacco. He reports that he does not drink alcohol or use drugs.   Family History:   family history includes Heart attack in his brother.    Review of Systems: Review of Systems  Constitutional: Negative.   Respiratory: Negative.   Cardiovascular: Negative.   Gastrointestinal: Negative.   Musculoskeletal: Positive for back pain and joint pain.  Neurological: Negative.   Psychiatric/Behavioral: Negative.   All other systems reviewed and are negative.    PHYSICAL EXAM: VS:  BP 130/60 (BP Location: Left Arm, Patient Position: Sitting, Cuff Size: Normal)   Pulse 62   Ht 5' 10.5" (1.791 m)   Wt 275 lb 8 oz (125 kg)   BMI 38.97 kg/m  , BMI Body mass index is 38.97 kg/m. Constitutional:  oriented to person, place, and time. No distress.  HENT:  Head: Normocephalic and atraumatic.  Eyes:  no discharge. No scleral icterus.  Neck: Normal range of motion. Neck supple. No JVD present.  Cardiovascular: Normal rate, regular rhythm, normal heart sounds and intact distal pulses. Exam reveals no gallop and no friction rub. Trace nonpitting lower extremity edema No murmur heard. Pulmonary/Chest: Effort normal and breath sounds normal. No stridor. No respiratory distress.  no wheezes.  no rales.  no tenderness.  Abdominal: Soft.  no distension.  no tenderness.  Musculoskeletal: Normal range of motion.  no  tenderness or deformity.  Neurological:  normal muscle tone. Coordination normal. No atrophy Skin: Skin is warm and dry. No rash noted. not diaphoretic.  Psychiatric:  normal mood and affect. behavior is normal. Thought content normal.    Recent Labs: 04/22/2018: B Natriuretic Peptide 49.0 04/24/2018: Magnesium 1.8 06/12/2018: ALT 29; BUN 18;  Creatinine, Ser 1.25; Hemoglobin 11.2; Platelets 129; Potassium 4.0; Sodium 140; TSH 3.650    Lipid Panel Lab Results  Component Value Date   CHOL 130 06/12/2018   HDL 29 (L) 06/12/2018   LDLCALC 42 06/12/2018   TRIG 297 (H) 06/12/2018      Wt Readings from Last 3 Encounters:  07/21/18 275 lb 8 oz (125 kg)  06/08/18 273 lb (123.8 kg)  05/11/18 266 lb (120.7 kg)  ASSESSMENT AND PLAN:  Atrial flutter, unspecified type ([place]) - Plan: EKG 12-Lead No recent episodes of arrhythmia We did discuss stopping the amiodarone He prefers to stay on the medication Does not want anticoagulation  HYPERTENSION, BENIGN - Plan: EKG 12-Lead Blood pressure is well controlled on today's visit. No changes made to the medications. stable  Paroxysmal atrial fibrillation (HCC) - Plan: EKG 12-Lead No recent arrhythmia  On amiodarone Previously declining anticoagulation No recent arrhythmia over the past 4 years  Hyperlipidemia Dramatic weight gain likely from poor diet Much of his visit today spent discussing dietary modification and exercise program  Chronic diastolic CHF (congestive heart failure) ([place]) He is taking Lasix 40 mg daily Previously was taking Lasix twice a day now with weight gain Concern for diastolic CHF with some shortness of breath on today's visit Recommended he consider taking Lasix after lunch for twice a day dosing twice a week or as needed for leg swelling and abdominal bloating  Uncontrolled type 2 diabetes mellitus with complication, with long-term current use of We have encouraged continued exercise, careful diet management in an effort to lose weight.  Primary osteoarthritis of both knees Limiting his ability to ambulate and exercise    Total encounter time more than 25 minutes  Greater than 50% was spent in counseling and coordination of care with the patient   Disposition:   F/U  12 months   Orders Placed This Encounter  Procedures  . EKG 12-Lead      Signed, Esmond Plants, M.D., Ph.D. 07/21/2018  [place], [place]

## 2018-07-21 ENCOUNTER — Ambulatory Visit (INDEPENDENT_AMBULATORY_CARE_PROVIDER_SITE_OTHER): Payer: Medicare Other | Admitting: Cardiovascular Disease

## 2018-07-21 ENCOUNTER — Encounter: Payer: Self-pay | Admitting: Cardiovascular Disease

## 2018-07-21 VITALS — BP 130/60 | HR 62 | Ht 70.5 in | Wt 275.5 lb

## 2018-07-21 DIAGNOSIS — Z794 Long term (current) use of insulin: Secondary | ICD-10-CM

## 2018-07-21 DIAGNOSIS — R0602 Shortness of breath: Secondary | ICD-10-CM

## 2018-07-21 DIAGNOSIS — I5032 Chronic diastolic (congestive) heart failure: Secondary | ICD-10-CM | POA: Diagnosis not present

## 2018-07-21 DIAGNOSIS — I483 Typical atrial flutter: Secondary | ICD-10-CM | POA: Diagnosis not present

## 2018-07-21 DIAGNOSIS — E1165 Type 2 diabetes mellitus with hyperglycemia: Secondary | ICD-10-CM | POA: Diagnosis not present

## 2018-07-21 DIAGNOSIS — I10 Essential (primary) hypertension: Secondary | ICD-10-CM

## 2018-07-21 DIAGNOSIS — E782 Mixed hyperlipidemia: Secondary | ICD-10-CM

## 2018-07-21 DIAGNOSIS — Z6838 Body mass index (BMI) 38.0-38.9, adult: Secondary | ICD-10-CM

## 2018-07-21 DIAGNOSIS — IMO0002 Reserved for concepts with insufficient information to code with codable children: Secondary | ICD-10-CM

## 2018-07-21 DIAGNOSIS — E118 Type 2 diabetes mellitus with unspecified complications: Secondary | ICD-10-CM

## 2018-07-21 MED ORDER — FUROSEMIDE 40 MG PO TABS: ORAL_TABLET | ORAL | Status: DC

## 2018-07-21 NOTE — Patient Instructions (Addendum)
Medication Instructions:   Please take extra lasix after lunch as needed for leg swelling, ABD tightness   Labwork:  No new labs needed  Testing/Procedures:  No further testing at this time   Follow-Up: It was a pleasure seeing you in the office today. Please call us if you have new issues that need to be addressed before your next appt.  [phone]  Your physician wants you to follow-up in:  March 2020 with Dr. Cathie Hoops will receive a reminder letter in the mail two months in advance. If you don't receive a letter, please call our office to schedule the follow-up appointment.  If you need a refill on your cardiac medications before your next appointment, please call your pharmacy.  For educational health videos Log in to : [email or web address] Or : [email or web address], password : [email or web address]

## 2018-07-27 ENCOUNTER — Other Ambulatory Visit: Payer: Self-pay | Admitting: Family Medicine

## 2018-08-09 ENCOUNTER — Other Ambulatory Visit: Payer: Self-pay | Admitting: Family Medicine

## 2018-08-09 NOTE — Telephone Encounter (Signed)
Pt needs a 90 day supply of Oxycontin

## 2018-08-09 NOTE — Telephone Encounter (Signed)
Patient needs refill on Oxycontin 40 mg. To mail order [facility] mail order

## 2018-08-10 DIAGNOSIS — E118 Type 2 diabetes mellitus with unspecified complications: Secondary | ICD-10-CM | POA: Diagnosis not present

## 2018-08-10 DIAGNOSIS — S91209A Unspecified open wound of unspecified toe(s) with damage to nail, initial encounter: Secondary | ICD-10-CM | POA: Diagnosis not present

## 2018-08-10 MED ORDER — OXYCODONE HCL ER 40 MG PO T12A: 40.0000 mg | EXTENDED_RELEASE_TABLET | Freq: Three times a day (TID) | ORAL | 0 refills | Status: DC | PRN

## 2018-08-15 ENCOUNTER — Ambulatory Visit (INDEPENDENT_AMBULATORY_CARE_PROVIDER_SITE_OTHER): Payer: Medicare Other | Admitting: Sports Medicine

## 2018-08-15 ENCOUNTER — Telehealth: Payer: Self-pay | Admitting: Family Medicine

## 2018-08-15 ENCOUNTER — Encounter: Payer: Self-pay | Admitting: Sports Medicine

## 2018-08-15 VITALS — BP 144/69 | HR 66

## 2018-08-15 DIAGNOSIS — L603 Nail dystrophy: Secondary | ICD-10-CM

## 2018-08-15 DIAGNOSIS — E1142 Type 2 diabetes mellitus with diabetic polyneuropathy: Secondary | ICD-10-CM | POA: Diagnosis not present

## 2018-08-15 DIAGNOSIS — I739 Peripheral vascular disease, unspecified: Secondary | ICD-10-CM | POA: Diagnosis not present

## 2018-08-15 MED ORDER — AMOXICILLIN-POT CLAVULANATE 875-125 MG PO TABS: 1.0000 | ORAL_TABLET | Freq: Two times a day (BID) | ORAL | 0 refills | Status: DC

## 2018-08-15 NOTE — Patient Instructions (Signed)

## 2018-08-15 NOTE — Telephone Encounter (Signed)
Pt's wife called and said [facility] is waiting for a PA from Dr. [name] for the oxycodone 40 mg 1 tab every 8 hrs.  Have we recd anything form them?  Pt's CB# [phone]  Thanks Con Memos

## 2018-08-15 NOTE — Progress Notes (Signed)
Subjective: Jerry Parsons is a 67 y.o. male Diabetic patient presents to office today complaining of a moderately painful 6/10 lifting left 1st toenail that is getting worse over the last 1 week. This has been present for a long time. Patient has treated this by nothing. Patient denies fever/chills/nausea/vomitting/any other related constitutional symptoms at this time.  A1c 6.8  Review of Systems  Musculoskeletal:       Toe pain  Neurological: Positive for sensory change.  All other systems reviewed and are negative.    Patient Active Problem List   Diagnosis Date Noted  . Lumbar spondylosis 05/05/2018  . Spinal stenosis of lumbar region 05/05/2018  . Constipation, chronic 03/08/2018  . Pain in limb 03/03/2018  . Gastroesophageal reflux disease without esophagitis 01/04/2018  . OA (osteoarthritis) of knee 06/20/2017  . Sepsis due to pneumonia ([place]) 05/07/2017  . Neuropathy 10/13/2016  . Encephalopathy 01/21/2016  . Amblyopia 12/30/2015  . 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 NS (nuclear sclerosis) 12/30/2015  . Pseudoaphakia 12/30/2015  . CVA (cerebral infarction) 09/08/2015  . Dehydration 09/08/2015  . Aspiration pneumonia ([place]) 07/04/2015  . Paroxysmal atrial fibrillation ([place]) 07/04/2015  . Arthritis of knee, degenerative 05/28/2015  . Chronic back pain 09/17/2014  . Leg edema 05/07/2014  . Chronic diastolic CHF (congestive heart failure) ([place]) 05/07/2014  . Campylobacter diarrhea 04/25/2013  . Atrial flutter ([place]) 01/23/2013  . Obesity 05/19/2012  . Hyperlipidemia 11/11/2011  . Diastolic dysfunction 78/29/5621  . SOB (shortness of breath) 04/12/2011  . Diabetes mellitus type 2, uncontrolled, with complications ([place]) 30/86/5784  . HYPERTENSION, BENIGN 03/15/2011  . DVT 03/15/2011  . TACHYCARDIA 03/15/2011    Current Outpatient Medications on File Prior to Visit  Medication Sig Dispense Refill  . amiodarone (PACERONE) 200 MG tablet TAKE 1 TABLET BY MOUTH DAILY 30 tablet 3   . aspirin 81 MG tablet Take 81 mg by mouth daily.    . cyclobenzaprine (FLEXERIL) 10 MG tablet TAKE 1 TABLET BY MOUTH 3 TIMES A DAY 90 tablet 12  . dutasteride (AVODART) 0.5 MG capsule TAKE 1 CAPSULE DAILY 90 capsule 3  . esomeprazole (NEXIUM) 40 MG capsule TAKE 1 CAPSULE TWICE DAILY 90 capsule 3  . fexofenadine (ALLEGRA) 180 MG tablet Take 180 mg by mouth daily.      . furosemide (LASIX) 40 MG tablet Take 1 tablet (40 mg) by mouth once daily, you may take 1 extra tablet (40 mg) after lunch as needed for leg swelling/ abdominal tightness    . glucose blood (ONE TOUCH ULTRA TEST) test strip CHECK SUGAR TWICE DAILY. DX- E11.8 100 each 3  . hydrochlorothiazide (HYDRODIURIL) 12.5 MG tablet TAKE 2 TABLETS (25 MG TOTAL) BY MOUTH AT BEDTIME. 90 tablet 3  . insulin aspart (NOVOLOG FLEXPEN) 100 UNIT/ML FlexPen Inject 12 Units into the skin 3 (three) times daily with meals.    . Insulin Pen Needle (PEN NEEDLES) 31G X 5 MM MISC 1 each by Does not apply route daily. PATIENT NEEDS NOVA TWIST NEEDLES 5 MM  DX E11.9 200 each 3  . JANUVIA 100 MG tablet TAKE 1 TABLET DAILY 90 tablet 3  . LANTUS SOLOSTAR 100 UNIT/ML Solostar Pen INJECT 30 UNITS            SUBCUTANEOUSLY TWO TIMES A DAY 45 mL 3  . liraglutide (VICTOZA) 18 MG/3ML SOPN INJECT 0.3ML (=1.8MG ) INTO THE SKIN DAILY 27 mL 3  . magnesium oxide (MAG-OX) 400 MG tablet TAKE 1 TABLET (400 MG TOTAL) BY MOUTH 2 (  TWO) TIMES DAILY. 180 tablet 3  . metoprolol tartrate (LOPRESSOR) 100 MG tablet TAKE 1 TABLET (100 MG TOTAL) BY MOUTH 2 (TWO) TIMES DAILY. 60 tablet 10  . Omega-3 Fatty Acids (FISH OIL) 1200 MG CAPS Take 1,200 mg by mouth 2 (two) times daily.     [name] oxyCODONE (OXYCONTIN) 40 mg 12 hr tablet Take 1 tablet (40 mg total) by mouth every 8 (eight) hours as needed. 270 tablet 0  . pregabalin (LYRICA) 200 MG capsule Take 1 capsule (200 mg total) by mouth 3 (three) times daily. 270 capsule 3  . ranitidine (ZANTAC) 300 MG tablet Take 1 tablet (300 mg total) by mouth  daily. 90 tablet 3  . sucralfate (CARAFATE) 1 g tablet TAKE 1 TABLET (1 G TOTAL) BY MOUTH 4 (FOUR) TIMES DAILY - WITH MEALS AND AT BEDTIME. 120 tablet 11  . testosterone cypionate (DEPOTESTOSTERONE CYPIONATE) 200 MG/ML injection     . NOVOTWIST 32G X 5 MM MISC USE 6 TIMES A DAY DX E11.9 (Patient not taking: Reported on 08/15/2018) 90 each 9   No current facility-administered medications on file prior to visit.     Allergies  Allergen Reactions  . Carisoprodol Itching    Objective:  There were no vitals filed for this visit.  General: Well developed, nourished, in no acute distress, alert and oriented x3   Dermatology: Skin is warm, dry and supple bilateral. Left hallux nail appears to be partially detached. (-) Erythema. (-) Edema. (-) serosanguous  drainage present. The remaining nails appear unremarkable at this time. There are no open sores, lesions or other signs of infection  present.  Vascular: Dorsalis Pedis artery and Posterior Tibial artery pedal pulses are 1/4 bilateral with immedate capillary fill time. Pedal hair growth present. No lower extremity edema.   Neruologic: Grossly intact via light touch bilateral. Protective diminished.   Musculoskeletal: Tenderness to palpation of the Left 1st toe. + Hammertoes. Muscular strength within 4/5 in all groups bilateral. Left foot drop, gait issues due to back.   Assesement and Plan: Problem List Items Addressed This Visit    None    Visit Diagnoses    Onychodystrophy    -  Primary   Relevant Medications   amoxicillin-clavulanate (AUGMENTIN) 875-125 MG tablet   PAD (peripheral artery disease) (HCC)       Relevant Orders   POCT ABI Screening for [place] No Charge   Diabetic polyneuropathy associated with type 2 diabetes mellitus ([place])         -Patient evaluated -Diabetic foot exam completed -ABIs in office normal  -Discussed treatment alternatives and plan of care; Explained temporary nail avulsion and post procedure course  to patient. - After a verbal and written consent, injected 3 ml of a 50:50 mixture of 2% plain  lidocaine and 0.5% plain marcaine in a normal hallux block fashion. Next, a  betadine prep was performed. Anesthesia was tested and found to be appropriate.  The offending left hallux nail in total was then incised from the hyponychium to the epinychium. The offending nail border was removed and cleared from the field. The area was curretted for any remaining nail or spicules and the area was then flushed with alcohol and dressed with antibiotic cream and a dry sterile dressing. -Patient was instructed to leave the dressing intact for today and begin soaking  in a weak solution of betadine or Epsom salt and water tomorrow. Patient was instructed to  soak for 15 minutes each day and apply neosporin  and a gauze or bandaid dressing each day. -Patient was instructed to monitor the toe for signs of infection and return to office if toe becomes red, hot or swollen. -Rx Augmentin for preventative measures post procedure -Advised ice, elevation, and tylenol or motrin if needed for pain.  -Patient is to return in 2 weeks for follow up care/nail check or sooner if problems arise.  Landis Martins, DPM

## 2018-08-15 NOTE — Telephone Encounter (Signed)
PA was processed and approved until 02/11/2019. They will fill the medication.

## 2018-08-23 ENCOUNTER — Ambulatory Visit: Payer: Medicare Other | Admitting: Podiatry

## 2018-08-29 ENCOUNTER — Other Ambulatory Visit: Payer: Medicare Other

## 2018-08-31 ENCOUNTER — Other Ambulatory Visit: Payer: Self-pay | Admitting: Family Medicine

## 2018-08-31 MED ORDER — PREGABALIN 200 MG PO CAPS: 200.0000 mg | ORAL_CAPSULE | Freq: Three times a day (TID) | ORAL | 3 refills | Status: DC

## 2018-08-31 NOTE — Telephone Encounter (Signed)
Patient needs refills on Lyrica 200 mg. To [facility]

## 2018-08-31 NOTE — Telephone Encounter (Signed)
Needs 90 days worth of Lyrica please

## 2018-09-04 ENCOUNTER — Encounter: Payer: Self-pay | Admitting: Family Medicine

## 2018-09-05 ENCOUNTER — Ambulatory Visit (INDEPENDENT_AMBULATORY_CARE_PROVIDER_SITE_OTHER): Payer: Medicare Other | Admitting: Sports Medicine

## 2018-09-05 DIAGNOSIS — L603 Nail dystrophy: Secondary | ICD-10-CM

## 2018-09-05 DIAGNOSIS — Z9889 Other specified postprocedural states: Secondary | ICD-10-CM

## 2018-09-05 DIAGNOSIS — E1142 Type 2 diabetes mellitus with diabetic polyneuropathy: Secondary | ICD-10-CM

## 2018-09-05 NOTE — Progress Notes (Signed)
Subjective: Jerry Parsons is a 67 y.o. male patient returns to office today for follow up evaluation after having Left Hallux total nail avulsion performed on 08-15-18 Patient has been soaking using epsom salt and applying topical antibiotic covered with bandaid daily. Patient denies fever/chills/nausea/vomitting/any other related constitutional symptoms at this time.  Patient Active Problem List   Diagnosis Date Noted  . Lumbar spondylosis 05/05/2018  . Spinal stenosis of lumbar region 05/05/2018  . Constipation, chronic 03/08/2018  . Pain in limb 03/03/2018  . Gastroesophageal reflux disease without esophagitis 01/04/2018  . OA (osteoarthritis) of knee 06/20/2017  . Sepsis due to pneumonia ([place]) 05/07/2017  . Neuropathy 10/13/2016  . Encephalopathy 01/21/2016  . Amblyopia 12/30/2015  . 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 NS (nuclear sclerosis) 12/30/2015  . Pseudoaphakia 12/30/2015  . CVA (cerebral infarction) 09/08/2015  . Dehydration 09/08/2015  . Aspiration pneumonia ([place]) 07/04/2015  . Paroxysmal atrial fibrillation ([place]) 07/04/2015  . Arthritis of knee, degenerative 05/28/2015  . Chronic back pain 09/17/2014  . Leg edema 05/07/2014  . Chronic diastolic CHF (congestive heart failure) ([place]) 05/07/2014  . Campylobacter diarrhea 04/25/2013  . Atrial flutter ([place]) 01/23/2013  . Obesity 05/19/2012  . Hyperlipidemia 11/11/2011  . Diastolic dysfunction 14/48/1856  . SOB (shortness of breath) 04/12/2011  . Diabetes mellitus type 2, uncontrolled, with complications ([place]) 31/49/7026  . HYPERTENSION, BENIGN 03/15/2011  . DVT 03/15/2011  . TACHYCARDIA 03/15/2011    Current Outpatient Medications on File Prior to Visit  Medication Sig Dispense Refill  . amiodarone (PACERONE) 200 MG tablet TAKE 1 TABLET BY MOUTH DAILY 30 tablet 3  . amoxicillin-clavulanate (AUGMENTIN) 875-125 MG tablet Take 1 tablet by mouth 2 (two) times daily. 28 tablet 0  . aspirin 81 MG tablet Take 81 mg by mouth daily.     . cyclobenzaprine (FLEXERIL) 10 MG tablet TAKE 1 TABLET BY MOUTH 3 TIMES A DAY 90 tablet 12  . dutasteride (AVODART) 0.5 MG capsule TAKE 1 CAPSULE DAILY 90 capsule 3  . esomeprazole (NEXIUM) 40 MG capsule TAKE 1 CAPSULE TWICE DAILY 90 capsule 3  . fexofenadine (ALLEGRA) 180 MG tablet Take 180 mg by mouth daily.      . furosemide (LASIX) 40 MG tablet Take 1 tablet (40 mg) by mouth once daily, you may take 1 extra tablet (40 mg) after lunch as needed for leg swelling/ abdominal tightness    . glucose blood (ONE TOUCH ULTRA TEST) test strip CHECK SUGAR TWICE DAILY. DX- E11.8 100 each 3  . hydrochlorothiazide (HYDRODIURIL) 12.5 MG tablet TAKE 2 TABLETS (25 MG TOTAL) BY MOUTH AT BEDTIME. 90 tablet 3  . insulin aspart (NOVOLOG FLEXPEN) 100 UNIT/ML FlexPen Inject 12 Units into the skin 3 (three) times daily with meals.    . Insulin Pen Needle (PEN NEEDLES) 31G X 5 MM MISC 1 each by Does not apply route daily. PATIENT NEEDS NOVA TWIST NEEDLES 5 MM  DX E11.9 200 each 3  . JANUVIA 100 MG tablet TAKE 1 TABLET DAILY 90 tablet 3  . LANTUS SOLOSTAR 100 UNIT/ML Solostar Pen INJECT 30 UNITS            SUBCUTANEOUSLY TWO TIMES A DAY 45 mL 3  . liraglutide (VICTOZA) 18 MG/3ML SOPN INJECT 0.3ML (=1.8MG ) INTO THE SKIN DAILY 27 mL 3  . magnesium oxide (MAG-OX) 400 MG tablet TAKE 1 TABLET (400 MG TOTAL) BY MOUTH 2 (TWO) TIMES DAILY. 180 tablet 3  . metoprolol tartrate (LOPRESSOR) 100 MG tablet TAKE 1 TABLET (100 MG  TOTAL) BY MOUTH 2 (TWO) TIMES DAILY. 60 tablet 10  . NOVOTWIST 32G X 5 MM MISC USE 6 TIMES A DAY DX E11.9 (Patient not taking: Reported on 08/15/2018) 90 each 9  . Omega-3 Fatty Acids (FISH OIL) 1200 MG CAPS Take 1,200 mg by mouth 2 (two) times daily.     [name] oxyCODONE (OXYCONTIN) 40 mg 12 hr tablet Take 1 tablet (40 mg total) by mouth every 8 (eight) hours as needed. 270 tablet 0  . pregabalin (LYRICA) 200 MG capsule Take 1 capsule (200 mg total) by mouth 3 (three) times daily. 270 capsule 3  . ranitidine  (ZANTAC) 300 MG tablet Take 1 tablet (300 mg total) by mouth daily. 90 tablet 3  . sucralfate (CARAFATE) 1 g tablet TAKE 1 TABLET (1 G TOTAL) BY MOUTH 4 (FOUR) TIMES DAILY - WITH MEALS AND AT BEDTIME. 120 tablet 11  . testosterone cypionate (DEPOTESTOSTERONE CYPIONATE) 200 MG/ML injection      No current facility-administered medications on file prior to visit.     Allergies  Allergen Reactions  . Carisoprodol Itching    Objective:  General: Well developed, nourished, in no acute distress, alert and oriented x3   Dermatology: Skin is warm, dry and supple bilateral. Left hallux nail bed appears to be clean, dry, with mild granular tissue and surrounding eschar/scab. (-) Erythema. (-) Edema. (-) serosanguous drainage present. The remaining nails appear unremarkable at this time mildly thickened and elongated. There are no other lesions or other signs of infection  Present. Dry callus with blood at right 1st toe with no opening or acute infection.   Neurovascular status: Unchanged; No lower extremity swelling; No pain with calf compression bilateral.  Musculoskeletal: No tenderness to palpation of the left hallux. Muscular strength within normal limits bilateral. + hammertoes.    Assesement and Plan: Problem List Items Addressed This Visit    None    Visit Diagnoses    S/P nail surgery    -  Primary   Onychodystrophy       Diabetic polyneuropathy associated with type 2 diabetes mellitus ([place])          -Examined patient  -Cleansed left hallux nail bed and gently scrubbed with peroxide and q-tip/curetted away eschar at site and applied antibiotic cream covered with bandaid.  -Discussed plan of care with patient. -Patient to now begin soaking in a weak solution of Epsom salt and warm water. Patient was instructed to soak for 15-20 minutes each day until the toe appears normal and there is no drainage, redness, tenderness, or swelling at the procedure site, and apply neosporin and a  gauze or bandaid dressing each day as needed. May leave open to air at night. -Educated patient on long term care after nail surgery. -At no charge trimmed nails using sterile nipper without incident and advised patient that we will continue to monitor right great toe dry blood at callus area -Patient was instructed to monitor the toe for reoccurrence and signs of infection; Patient advised to return to office or go to ER if toe becomes red, hot or swollen. -Patient is to return in 64 days for routine diabetic foot care or sooner if problems arise.  Landis Martins, DPM

## 2018-09-05 NOTE — Patient Instructions (Signed)

## 2018-09-07 DIAGNOSIS — N401 Enlarged prostate with lower urinary tract symptoms: Secondary | ICD-10-CM | POA: Diagnosis not present

## 2018-09-07 DIAGNOSIS — N5201 Erectile dysfunction due to arterial insufficiency: Secondary | ICD-10-CM | POA: Diagnosis not present

## 2018-09-07 DIAGNOSIS — E291 Testicular hypofunction: Secondary | ICD-10-CM | POA: Diagnosis not present

## 2018-09-08 DIAGNOSIS — M4800 Spinal stenosis, site unspecified: Secondary | ICD-10-CM | POA: Diagnosis not present

## 2018-09-08 DIAGNOSIS — M4716 Other spondylosis with myelopathy, lumbar region: Secondary | ICD-10-CM | POA: Diagnosis not present

## 2018-09-09 ENCOUNTER — Other Ambulatory Visit: Payer: Self-pay | Admitting: Family Medicine

## 2018-09-17 ENCOUNTER — Other Ambulatory Visit: Payer: Self-pay | Admitting: Family Medicine

## 2018-09-18 DIAGNOSIS — E669 Obesity, unspecified: Secondary | ICD-10-CM | POA: Diagnosis not present

## 2018-09-18 DIAGNOSIS — G4733 Obstructive sleep apnea (adult) (pediatric): Secondary | ICD-10-CM | POA: Diagnosis not present

## 2018-09-18 DIAGNOSIS — I10 Essential (primary) hypertension: Secondary | ICD-10-CM | POA: Diagnosis not present

## 2018-09-18 DIAGNOSIS — K219 Gastro-esophageal reflux disease without esophagitis: Secondary | ICD-10-CM | POA: Diagnosis not present

## 2018-09-18 DIAGNOSIS — G894 Chronic pain syndrome: Secondary | ICD-10-CM | POA: Diagnosis not present

## 2018-09-18 DIAGNOSIS — E1140 Type 2 diabetes mellitus with diabetic neuropathy, unspecified: Secondary | ICD-10-CM | POA: Diagnosis not present

## 2018-09-27 DIAGNOSIS — N401 Enlarged prostate with lower urinary tract symptoms: Secondary | ICD-10-CM | POA: Diagnosis not present

## 2018-09-27 DIAGNOSIS — E291 Testicular hypofunction: Secondary | ICD-10-CM | POA: Diagnosis not present

## 2018-09-27 DIAGNOSIS — R300 Dysuria: Secondary | ICD-10-CM | POA: Diagnosis not present

## 2018-09-27 DIAGNOSIS — R350 Frequency of micturition: Secondary | ICD-10-CM | POA: Diagnosis not present

## 2018-09-27 DIAGNOSIS — Z79899 Other long term (current) drug therapy: Secondary | ICD-10-CM | POA: Diagnosis not present

## 2018-09-27 DIAGNOSIS — R3915 Urgency of urination: Secondary | ICD-10-CM | POA: Diagnosis not present

## 2018-09-28 ENCOUNTER — Telehealth: Payer: Self-pay | Admitting: Cardiovascular Disease

## 2018-09-28 NOTE — Telephone Encounter (Signed)
Alfuzosin and amiodarone are not contraindicated, however when used together may decrease BP. Would advise pt to monitor for low BP or dizziness.

## 2018-09-28 NOTE — Telephone Encounter (Signed)
Pt c/o medication issue:  1. Name of Medication: alfuzosine  2. How are you currently taking this medication (dosage and times per day)? 10 mg po q day   3. Are you having a reaction (difficulty breathing--STAT)? no  4. What is your medication issue? This is rx from Dr. Rogers Blocker and patient was told it may interact with amiodarone please call to advise

## 2018-09-28 NOTE — Telephone Encounter (Signed)
S/w wife. Patient was recently prescribed Alfuzosin 10 mg by mouth once a day by his urologist and told there may be a contraindication with his amiodarone.  Advised her I will route to pharmacy for advice.

## 2018-09-29 NOTE — Telephone Encounter (Signed)
S/w wife. She verbalized understanding of recommendations. They will monitor BP and for dizziness and call us or urologist if any concerns or symptoms arise.

## 2018-10-05 ENCOUNTER — Ambulatory Visit (INDEPENDENT_AMBULATORY_CARE_PROVIDER_SITE_OTHER): Payer: Medicare Other | Admitting: Family Medicine

## 2018-10-05 ENCOUNTER — Ambulatory Visit (INDEPENDENT_AMBULATORY_CARE_PROVIDER_SITE_OTHER): Payer: Medicare Other

## 2018-10-05 VITALS — BP 118/56 | HR 70 | Temp 98.8°F | Ht 71.0 in | Wt 278.2 lb

## 2018-10-05 VITALS — BP 138/58 | HR 78 | Temp 98.3°F | Resp 16 | Wt 278.0 lb

## 2018-10-05 DIAGNOSIS — Z23 Encounter for immunization: Secondary | ICD-10-CM | POA: Diagnosis not present

## 2018-10-05 DIAGNOSIS — IMO0002 Reserved for concepts with insufficient information to code with codable children: Secondary | ICD-10-CM

## 2018-10-05 DIAGNOSIS — I480 Paroxysmal atrial fibrillation: Secondary | ICD-10-CM

## 2018-10-05 DIAGNOSIS — Z6838 Body mass index (BMI) 38.0-38.9, adult: Secondary | ICD-10-CM

## 2018-10-05 DIAGNOSIS — E118 Type 2 diabetes mellitus with unspecified complications: Secondary | ICD-10-CM

## 2018-10-05 DIAGNOSIS — Z9989 Dependence on other enabling machines and devices: Secondary | ICD-10-CM | POA: Diagnosis not present

## 2018-10-05 DIAGNOSIS — Z0000 Encounter for general adult medical examination without abnormal findings: Secondary | ICD-10-CM | POA: Diagnosis not present

## 2018-10-05 DIAGNOSIS — G629 Polyneuropathy, unspecified: Secondary | ICD-10-CM | POA: Diagnosis not present

## 2018-10-05 DIAGNOSIS — E1165 Type 2 diabetes mellitus with hyperglycemia: Secondary | ICD-10-CM | POA: Diagnosis not present

## 2018-10-05 DIAGNOSIS — G4733 Obstructive sleep apnea (adult) (pediatric): Secondary | ICD-10-CM | POA: Diagnosis not present

## 2018-10-05 MED ORDER — GABAPENTIN 100 MG PO CAPS: 100.0000 mg | ORAL_CAPSULE | Freq: Two times a day (BID) | ORAL | 3 refills | Status: DC

## 2018-10-05 NOTE — Progress Notes (Signed)
Jerry Parsons  MRN: [id number] DOB: 10/20/1951  Subjective:  HPI   The patient is a 67 year old male who presents for follow up after having his Medicare Annual Wellness exam with the Nurse Health Advisor.   He has chronic leg pain which he is struggling with . Back pain persists. He is not wearing CPAP every night.  Patient Active Problem List   Diagnosis Date Noted  . Lumbar spondylosis 05/05/2018  . Spinal stenosis of lumbar region 05/05/2018  . Constipation, chronic 03/08/2018  . Pain in limb 03/03/2018  . Gastroesophageal reflux disease without esophagitis 01/04/2018  . OA (osteoarthritis) of knee 06/20/2017  . Sepsis due to pneumonia ([place]) 05/07/2017  . Neuropathy 10/13/2016  . Encephalopathy 01/21/2016  . Amblyopia 12/30/2015  . 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 NS (nuclear sclerosis) 12/30/2015  . Pseudoaphakia 12/30/2015  . CVA (cerebral infarction) 09/08/2015  . Dehydration 09/08/2015  . Aspiration pneumonia ([place]) 07/04/2015  . Paroxysmal atrial fibrillation ([place]) 07/04/2015  . Arthritis of knee, degenerative 05/28/2015  . Chronic back pain 09/17/2014  . Leg edema 05/07/2014  . Chronic diastolic CHF (congestive heart failure) ([place]) 05/07/2014  . Campylobacter diarrhea 04/25/2013  . Atrial flutter ([place]) 01/23/2013  . Obesity 05/19/2012  . Hyperlipidemia 11/11/2011  . Diastolic dysfunction 08/65/7846  . SOB (shortness of breath) 04/12/2011  . Diabetes mellitus type 2, uncontrolled, with complications ([place]) 96/29/5284  . HYPERTENSION, BENIGN 03/15/2011  . DVT 03/15/2011  . TACHYCARDIA 03/15/2011    Past Medical History:  Diagnosis Date  . Arrhythmia    tachycardia, A-Fib  . Arthritis   . Blind right eye   . BPH (benign prostatic hyperplasia)   . Diabetes mellitus   . DVT (deep venous thrombosis) ([place]) 02/2010   leg thrombus ; dislodged into emboli and caused PE  . Dyspnea   . Dysrhythmia   . 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 GERD  (gastroesophageal reflux disease)   . Hypercholesteremia   . Hypertension   . Kidney failure   . Neuromuscular disorder ([place])   . Neuropathy   . Pneumonia    time 9 ;last episode 12/2015  . Pulmonary embolus ([place]) 2011  . Seasonal allergies   . Seizures ([place])    as child   . Sleep apnea    BIPAP  . Stiff neck    limited turning s/p titanium plate placement  . Stroke ([place])   . TIA (transient ischemic attack)   . Wears dentures    full upper and lower    Social History   Socioeconomic History  . Marital status: Married    Spouse name: Not on file  . Number of children: 1  . Years of education: Not on file  . Highest education level: 6th grade  Occupational History  . Occupation: retired  Scientific laboratory technician  . Financial resource strain: Not hard at all  . Food insecurity:    Worry: Never true    Inability: Never true  . Transportation needs:    Medical: No    Non-medical: No  Tobacco Use  . Smoking status: Former Smoker    Packs/day: 2.00    Years: 20.00    Pack years: 40.00    Types: Cigarettes    Last attempt to quit: 12/26/1989    Years since quitting: 28.7  . Smokeless tobacco: Never Used  Substance and Sexual Activity  . Alcohol use: No  . Drug use: No  . Sexual activity:  Not on file  Lifestyle  . Physical activity:    Days per week: Not on file    Minutes per session: Not on file  . Stress: Only a little  Relationships  . Social connections:    Talks on phone: Patient refused    Gets together: Patient refused    Attends religious service: Patient refused    Active member of club or organization: Patient refused    Attends meetings of clubs or organizations: Patient refused    Relationship status: Patient refused  . Intimate partner violence:    Fear of current or ex partner: Patient refused    Emotionally abused: Patient refused    Physically abused: Patient refused    Forced sexual activity: Patient refused  Other Topics Concern  . Not on file    Social History Narrative  . Not on file    Outpatient Encounter Medications as of 10/05/2018  Medication Sig  . amiodarone (PACERONE) 200 MG tablet TAKE 1 TABLET BY MOUTH DAILY  . aspirin 81 MG tablet Take 81 mg by mouth daily.  . cyclobenzaprine (FLEXERIL) 10 MG tablet TAKE 1 TABLET BY MOUTH 3 TIMES A DAY  . dutasteride (AVODART) 0.5 MG capsule TAKE 1 CAPSULE DAILY  . esomeprazole (NEXIUM) 40 MG capsule TAKE 1 CAPSULE TWICE DAILY  . fexofenadine (ALLEGRA) 180 MG tablet Take 180 mg by mouth daily.    . furosemide (LASIX) 40 MG tablet Take 1 tablet (40 mg) by mouth once daily, you may take 1 extra tablet (40 mg) after lunch as needed for leg swelling/ abdominal tightness  . glucose blood (ONE TOUCH ULTRA TEST) test strip CHECK SUGAR TWICE DAILY. DX- E11.8  . hydrochlorothiazide (HYDRODIURIL) 12.5 MG tablet TAKE 2 TABLETS (25 MG TOTAL) BY MOUTH AT BEDTIME.  . insulin aspart (NOVOLOG FLEXPEN) 100 UNIT/ML FlexPen Inject 12 Units into the skin 3 (three) times daily with meals.  . Insulin Pen Needle (PEN NEEDLES) 31G X 5 MM MISC 1 each by Does not apply route daily. PATIENT NEEDS NOVA TWIST NEEDLES 5 MM  DX E11.9  . JANUVIA 100 MG tablet TAKE 1 TABLET DAILY  . LANTUS SOLOSTAR 100 UNIT/ML Solostar Pen INJECT 30 UNITS            SUBCUTANEOUSLY TWO TIMES A DAY  . liraglutide (VICTOZA) 18 MG/3ML SOPN INJECT 0.3ML (=1.8MG ) INTO THE SKIN DAILY  . magnesium oxide (MAG-OX) 400 MG tablet TAKE 1 TABLET (400 MG TOTAL) BY MOUTH 2 (TWO) TIMES DAILY.  . metoprolol tartrate (LOPRESSOR) 100 MG tablet TAKE 1 TABLET (100 MG TOTAL) BY MOUTH 2 (TWO) TIMES DAILY.  . Multiple Vitamins-Minerals (CENTRUM SILVER 50+MEN) TABS Take by mouth daily.  [name] NOVOTWIST 32G X 5 MM MISC USE 6 TIMES A DAY DX E11.9  . Omega-3 Fatty Acids (FISH OIL) 1200 MG CAPS Take 1,200 mg by mouth 2 (two) times daily.   [name] oxyCODONE (OXYCONTIN) 40 mg 12 hr tablet Take 1 tablet (40 mg total) by mouth every 8 (eight) hours as needed.  . pregabalin  (LYRICA) 200 MG capsule Take 1 capsule (200 mg total) by mouth 3 (three) times daily.  . ranitidine (ZANTAC) 300 MG tablet Take 1 tablet (300 mg total) by mouth daily.  . sucralfate (CARAFATE) 1 g tablet TAKE 1 TABLET (1 G TOTAL) BY MOUTH 4 (FOUR) TIMES DAILY - WITH MEALS AND AT BEDTIME.  [name] testosterone cypionate (DEPOTESTOSTERONE CYPIONATE) 200 MG/ML injection Inject 200 mg into the muscle every 21 ( twenty-one) days.   . [  DISCONTINUED] alfuzosin (UROXATRAL) 10 MG 24 hr tablet Take 10 mg by mouth daily with breakfast.  . [DISCONTINUED] amoxicillin-clavulanate (AUGMENTIN) 875-125 MG tablet Take 1 tablet by mouth 2 (two) times daily.  . [DISCONTINUED] potassium chloride SA (K-DUR,KLOR-CON) 20 MEQ tablet Take 20 mEq by mouth 2 (two) times daily.  . [DISCONTINUED] vancomycin 1,750 mg in sodium chloride 0.9 % 500 mL 1,750 mg daily.   No facility-administered encounter medications on file as of 10/05/2018.     Allergies  Allergen Reactions  . Carisoprodol Itching    Review of Systems  Constitutional: Positive for malaise/fatigue.  HENT: Negative.   Eyes: Negative.   Respiratory: Negative.   Cardiovascular: Positive for leg swelling.  Gastrointestinal: Negative.   Genitourinary: Negative.   Musculoskeletal: Positive for back pain and joint pain.  Skin: Negative.   Neurological: Negative.        Numbness/pain in left Greater than right leg from knees to feet.  Endo/Heme/Allergies: Positive for environmental allergies.  Psychiatric/Behavioral: Negative.     Objective:  BP (!) 138/58 (BP Location: Right Arm, Patient Position: Sitting, Cuff Size: Normal)   Pulse 78   Temp 98.3 F (36.8 C) (Oral)   Resp 16   Wt 278 lb (126.1 kg)   SpO2 94%   BMI 38.77 kg/m   Physical Exam  Constitutional: He is oriented to person, place, and time and well-developed, well-nourished, and in no distress.  HENT:  Head: Normocephalic and atraumatic.  Right Ear: External ear normal.  Left Ear: External  ear normal.  Nose: Nose normal.  Eyes: Conjunctivae are normal. No scleral icterus.  Neck: No thyromegaly present.  Cardiovascular: Normal rate, regular rhythm and normal heart sounds.  Pulmonary/Chest: Effort normal and breath sounds normal.  Abdominal: Soft.  Musculoskeletal: He exhibits no edema.  Neurological: He is alert and oriented to person, place, and time. Gait normal. GCS score is 15.  Skin: Skin is warm and dry.  Psychiatric: Mood, memory, affect and judgment normal.    Assessment and Plan :   1. Neuropathy Try to add neurontin to [place]. This is major issue to pt. Will attempt to cut back On oxycontin in future--this is discussed today. - gabapentin (NEURONTIN) 100 MG capsule; Take 1 capsule (100 mg total) by mouth 2 (two) times daily.  Dispense: 60 capsule; Refill: 3  2. Paroxysmal atrial fibrillation (H[facility])   3. Diabetes mellitus type 2, uncontrolled, with complications ([facility]) RTC 1 month.  4. Class 2 severe obesity due to excess calories with serious comorbidity and body mass index (BMI) of 38.0 to 38.9 in adult ([place])   5. OSA on CPAP  I have done the exam and reviewed the chart and it is accurate to the best of my knowledge. Development worker, community has been used and  any errors in dictation or transcription are unintentional. Miguel Aschoff M.D. [facility]

## 2018-10-05 NOTE — Patient Instructions (Addendum)
Jerry Parsons , Thank you for taking time to come for your Medicare Wellness Visit. I appreciate your ongoing commitment to your health goals. Please review the following plan we discussed and let me know if I can assist you in the future.   Screening recommendations/referrals: Colonoscopy: Up to date Recommended yearly ophthalmology/optometry visit for glaucoma screening and checkup Recommended yearly dental visit for hygiene and checkup  Vaccinations: Influenza vaccine: Up to date Pneumococcal vaccine: Up to date Tdap vaccine: Up to date Shingles vaccine: Pt declines today.     Advanced directives: Please bring a copy of your POA (Power of Attorney) and/or Living Will to your next appointment.   Conditions/risks identified: Fall risk prevention; Obesity- continue cutting out any extra servings after 8 pm and cutting out all sodas in diet.   Next appointment: 3:00 PM today with Dr [name].   Preventive Care 3 Years and Older, Male Preventive care refers to lifestyle choices and visits with your health care provider that can promote health and wellness. What does preventive care include?  A yearly physical exam. This is also called an annual well check.  Dental exams once or twice a year.  Routine eye exams. Ask your health care provider how often you should have your eyes checked.  Personal lifestyle choices, including:  Daily care of your teeth and gums.  Regular physical activity.  Eating a healthy diet.  Avoiding tobacco and drug use.  Limiting alcohol use.  Practicing safe sex.  Taking low doses of aspirin every day.  Taking vitamin and mineral supplements as recommended by your health care provider. What happens during an annual well check? The services and screenings done by your health care provider during your annual well check will depend on your age, overall health, lifestyle risk factors, and family history of disease. Counseling  Your health care  provider may ask you questions about your:  Alcohol use.  Tobacco use.  Drug use.  Emotional well-being.  Home and relationship well-being.  Sexual activity.  Eating habits.  History of falls.  Memory and ability to understand (cognition).  Work and work Statistician. Screening  You may have the following tests or measurements:  Height, weight, and BMI.  Blood pressure.  Lipid and cholesterol levels. These may be checked every 5 years, or more frequently if you are over 23 years old.  Skin check.  Lung cancer screening. You may have this screening every year starting at age 78 if you have a 30-pack-year history of smoking and currently smoke or have quit within the past 15 years.  Fecal occult blood test (FOBT) of the stool. You may have this test every year starting at age 57.  Flexible sigmoidoscopy or colonoscopy. You may have a sigmoidoscopy every 5 years or a colonoscopy every 10 years starting at age 79.  Prostate cancer screening. Recommendations will vary depending on your family history and other risks.  Hepatitis C blood test.  Hepatitis B blood test.  Sexually transmitted disease (STD) testing.  Diabetes screening. This is done by checking your blood sugar (glucose) after you have not eaten for a while (fasting). You may have this done every 1-3 years.  Abdominal aortic aneurysm (AAA) screening. You may need this if you are a current or former smoker.  Osteoporosis. You may be screened starting at age 21 if you are at high risk. Talk with your health care provider about your test results, treatment options, and if necessary, the need for more tests. Vaccines  Your health care provider may recommend certain vaccines, such as:  Influenza vaccine. This is recommended every year.  Tetanus, diphtheria, and acellular pertussis (Tdap, Td) vaccine. You may need a Td booster every 10 years.  Zoster vaccine. You may need this after age 28.  Pneumococcal  13-valent conjugate (PCV13) vaccine. One dose is recommended after age 89.  Pneumococcal polysaccharide (PPSV23) vaccine. One dose is recommended after age 41. Talk to your health care provider about which screenings and vaccines you need and how often you need them. This information is not intended to replace advice given to you by your health care provider. Make sure you discuss any questions you have with your health care provider. Document Released: 01/09/2016 Document Revised: 09/01/2016 Document Reviewed: 10/14/2015 Elsevier Interactive Patient Education  2017 [employer] Prevention in the Home Falls can cause injuries. They can happen to people of all ages. There are many things you can do to make your home safe and to help prevent falls. What can I do on the outside of my home?  Regularly fix the edges of walkways and driveways and fix any cracks.  Remove anything that might make you trip as you walk through a door, such as a raised step or threshold.  Trim any bushes or trees on the path to your home.  Use bright outdoor lighting.  Clear any walking paths of anything that might make someone trip, such as rocks or tools.  Regularly check to see if handrails are loose or broken. Make sure that both sides of any steps have handrails.  Any raised decks and porches should have guardrails on the edges.  Have any leaves, snow, or ice cleared regularly.  Use sand or salt on walking paths during winter.  Clean up any spills in your garage right away. This includes oil or grease spills. What can I do in the bathroom?  Use night lights.  Install grab bars by the toilet and in the tub and shower. Do not use towel bars as grab bars.  Use non-skid mats or decals in the tub or shower.  If you need to sit down in the shower, use a plastic, non-slip stool.  Keep the floor dry. Clean up any water that spills on the floor as soon as it happens.  Remove soap buildup in the  tub or shower regularly.  Attach bath mats securely with double-sided non-slip rug tape.  Do not have throw rugs and other things on the floor that can make you trip. What can I do in the bedroom?  Use night lights.  Make sure that you have a light by your bed that is easy to reach.  Do not use any sheets or blankets that are too big for your bed. They should not hang down onto the floor.  Have a firm chair that has side arms. You can use this for support while you get dressed.  Do not have throw rugs and other things on the floor that can make you trip. What can I do in the kitchen?  Clean up any spills right away.  Avoid walking on wet floors.  Keep items that you use a lot in easy-to-reach places.  If you need to reach something above you, use a strong step stool that has a grab bar.  Keep electrical cords out of the way.  Do not use floor polish or wax that makes floors slippery. If you must use wax, use non-skid floor wax.  Do  not have throw rugs and other things on the floor that can make you trip. What can I do with my stairs?  Do not leave any items on the stairs.  Make sure that there are handrails on both sides of the stairs and use them. Fix handrails that are broken or loose. Make sure that handrails are as long as the stairways.  Check any carpeting to make sure that it is firmly attached to the stairs. Fix any carpet that is loose or worn.  Avoid having throw rugs at the top or bottom of the stairs. If you do have throw rugs, attach them to the floor with carpet tape.  Make sure that you have a light switch at the top of the stairs and the bottom of the stairs. If you do not have them, ask someone to add them for you. What else can I do to help prevent falls?  Wear shoes that:  Do not have high heels.  Have rubber bottoms.  Are comfortable and fit you well.  Are closed at the toe. Do not wear sandals.  If you use a stepladder:  Make sure that it  is fully opened. Do not climb a closed stepladder.  Make sure that both sides of the stepladder are locked into place.  Ask someone to hold it for you, if possible.  Clearly mark and make sure that you can see:  Any grab bars or handrails.  First and last steps.  Where the edge of each step is.  Use tools that help you move around (mobility aids) if they are needed. These include:  Canes.  Walkers.  Scooters.  Crutches.  Turn on the lights when you go into a dark area. Replace any light bulbs as soon as they burn out.  Set up your furniture so you have a clear path. Avoid moving your furniture around.  If any of your floors are uneven, fix them.  If there are any pets around you, be aware of where they are.  Review your medicines with your doctor. Some medicines can make you feel dizzy. This can increase your chance of falling. Ask your doctor what other things that you can do to help prevent falls. This information is not intended to replace advice given to you by your health care provider. Make sure you discuss any questions you have with your health care provider. Document Released: 10/09/2009 Document Revised: 05/20/2016 Document Reviewed: 01/17/2015 Elsevier Interactive Patient Education  2017 [employer].

## 2018-10-05 NOTE — Progress Notes (Signed)
Subjective:   Jerry Parsons is a 67 y.o. male who presents for Medicare Annual/Subsequent preventive examination.  Review of Systems:  N/A  Cardiac Risk Factors include: advanced age (>25men, >64 women);male gender;obesity (BMI >30kg/m2);diabetes mellitus;dyslipidemia;hypertension     Objective:    Vitals: BP (!) 118/56 (BP Location: Right Arm)   Pulse 70   Temp 98.8 F (37.1 C) (Oral)   Ht 5\' 11"  (1.803 m)   Wt 278 lb 3.2 oz (126.2 kg)   BMI 38.80 kg/m   Body mass index is 38.8 kg/m.  Advanced Directives 10/05/2018 04/26/2018 04/22/2018 04/22/2018 01/16/2018 08/31/2017 06/20/2017  Does Patient Have a Medical Advance Directive? Yes No Yes Yes Yes Yes No  Type of Paramedic of [place];Living will - Healthcare Power of Cecilia will [place];Living will Living will  Does patient want to make changes to medical advance directive? - - No - Patient declined - - - No - Patient declined  Copy of [place] in Chart? No - copy requested - - Yes - No - copy requested -  Would patient like information on creating a medical advance directive? - No - Patient declined - - - - No - Patient declined    Tobacco Social History   Tobacco Use  Smoking Status Former Smoker  . Packs/day: 2.00  . Years: 20.00  . Pack years: 40.00  . Types: Cigarettes  . Last attempt to quit: 12/26/1989  . Years since quitting: 28.7  Smokeless Tobacco Never Used     Counseling given: Not Answered   Clinical Intake:  Pre-visit preparation completed: Yes  Pain : 0-10 Pain Score: 6  Pain Type: Chronic pain Pain Location: Back Pain Orientation: Lower Pain Descriptors / Indicators: Radiating    Nutrition Risk Assessment:  Has the patient had any N/V/D within the last 2 months?  No  Does the patient have any non-healing wounds?  No  Has the patient had any unintentional weight loss or weight gain?  No    Diabetes:  Is the patient diabetic?  Yes , type 2 If diabetic, was a CBG obtained today?  No  Did the patient bring in their glucometer from home?  No  How often do you monitor your CBG's? daily.   Financial Strains and Diabetes Management:  Are you having any financial strains with the device, your supplies or your medication? No .  Does the patient want to be seen by Chronic Care Management for management of their diabetes?  No  Would the patient like to be referred to a Nutritionist or for Diabetic Management?  No   Diabetic Exams:  Diabetic Eye Exam: Completed 08/26/17. Overdue for diabetic eye exam. Pt has been advised about the importance in completing this exam. Pt to set up by the end of 2019.   Diabetic Foot Exam: Completed 08/15/18.   Nutritional Status: BMI > 30  Obese Diabetes: Yes  How often do you need to have someone help you when you read instructions, pamphlets, or other written materials from your doctor or pharmacy?: 1 - Never  Interpreter Needed?: No  Information entered by :: [facility], LPN  Past Medical History:  Diagnosis Date  . Arrhythmia    tachycardia, A-Fib  . Arthritis   . Blind right eye   . BPH (benign prostatic hyperplasia)   . Diabetes mellitus   . DVT (deep venous thrombosis) ([place]) 02/2010   leg thrombus ; dislodged  into emboli and caused PE  . Dyspnea   . Dysrhythmia   . 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 GERD (gastroesophageal reflux disease)   . Hypercholesteremia   . Hypertension   . Kidney failure   . Neuromuscular disorder ([place])   . Neuropathy   . Pneumonia    time 9 ;last episode 12/2015  . Pulmonary embolus ([place]) 2011  . Seasonal allergies   . Seizures ([place])    as child   . Sleep apnea    BIPAP  . Stiff neck    limited turning s/p titanium plate placement  . Stroke ([place])   . TIA (transient ischemic attack)   . Wears dentures    full upper and lower   Past Surgical History:  Procedure  Laterality Date  . APPENDECTOMY    . BACK SURGERY     x 8; upper x 3 & lower x 5  . CARDIOVERSION  03/14/13, 10/16   2014 - [place], 2016 - [place]  . CATARACT EXTRACTION W/PHACO Left 10/29/2015   Procedure: CATARACT EXTRACTION PHACO AND INTRAOCULAR LENS PLACEMENT (IOC);  Surgeon: Leandrew Koyanagi, MD;  Location: [facility];  Service: Ophthalmology;  Laterality: Left;  DIABETIC - insulin and oral medsSleep apnea - no machine  . CHOLECYSTECTOMY    . COLONOSCOPY WITH PROPOFOL N/A 01/16/2018   Procedure: COLONOSCOPY WITH PROPOFOL;  Surgeon: Manya Silvas, MD;  Location: [facility] ENDOSCOPY;  Service: Endoscopy;  Laterality: N/A;  . ESOPHAGOGASTRODUODENOSCOPY (EGD) WITH PROPOFOL N/A 01/16/2018   Procedure: ESOPHAGOGASTRODUODENOSCOPY (EGD) WITH PROPOFOL;  Surgeon: Manya Silvas, MD;  [facility] ENDOSCOPY;  Service: Endoscopy;  Laterality: N/A;  . EYE SURGERY    . GALLBLADDER SURGERY  2002  . JOINT REPLACEMENT Right 2018  . KNEE ARTHROSCOPY     left   . ROTATOR CUFF REPAIR  2001   left   . TEE WITHOUT CARDIOVERSION N/A 04/26/2018   Procedure: TRANSESOPHAGEAL ECHOCARDIOGRAM (TEE);  Surgeon: Nelva Bush, MD;  Location: [facility] ORS;  Service: Cardiovascular;  Laterality: N/A;  . TONSILLECTOMY    . 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 Heart attack Brother    Social History   Socioeconomic History  . Marital status: Married    Spouse name: Not on file  . Number of children: 1  . Years of education: Not on file  . Highest education level: 6th grade  Occupational History  . Occupation: retired  Scientific laboratory technician  . Financial resource strain: Not hard at all  . Food insecurity:    Worry: Never true    Inability: Never true  . Transportation needs:    Medical: No    Non-medical: No  Tobacco  Use  . Smoking status: Former Smoker    Packs/day: 2.00    Years: 20.00    Pack years: 40.00    Types: Cigarettes    Last attempt to quit: 12/26/1989    Years since quitting: 28.7  . Smokeless tobacco: Never Used  Substance and Sexual Activity  . Alcohol use: No  . Drug use: No  . Sexual activity: Not on file  Lifestyle  . Physical activity:    Days per week: Not on file    Minutes per session: Not on file  . Stress: Only a little  Relationships  . Social connections:    Talks on phone: Patient refused    Gets together: Patient refused    Attends religious service: Patient refused    Active member of club or organization: Patient refused    Attends meetings of clubs or organizations: Patient refused    Relationship status: Patient refused  Other Topics Concern  . Not on file  Social History Narrative  . Not on file    Outpatient Encounter Medications as of 10/05/2018  Medication Sig  . amiodarone (PACERONE) 200 MG tablet TAKE 1 TABLET BY MOUTH DAILY  . aspirin 81 MG tablet Take 81 mg by mouth daily.  . cyclobenzaprine (FLEXERIL) 10 MG tablet TAKE 1 TABLET BY MOUTH 3 TIMES A DAY  . dutasteride (AVODART) 0.5 MG capsule TAKE 1 CAPSULE DAILY  . esomeprazole (NEXIUM) 40 MG capsule TAKE 1 CAPSULE TWICE DAILY  . fexofenadine (ALLEGRA) 180 MG tablet Take 180 mg by mouth daily.    . furosemide (LASIX) 40 MG tablet Take 1 tablet (40 mg) by mouth once daily, you may take 1 extra tablet (40 mg) after lunch as needed for leg swelling/ abdominal tightness  . glucose blood (ONE TOUCH ULTRA TEST) test strip CHECK SUGAR TWICE DAILY. DX- E11.8  . hydrochlorothiazide (HYDRODIURIL) 12.5 MG tablet TAKE 2 TABLETS (25 MG TOTAL) BY MOUTH AT BEDTIME.  . insulin aspart (NOVOLOG FLEXPEN) 100 UNIT/ML FlexPen Inject 12 Units into the skin 3 (three) times daily with meals.  . Insulin Pen Needle (PEN NEEDLES) 31G X 5 MM MISC 1 each by Does not apply route daily. PATIENT NEEDS NOVA TWIST NEEDLES 5 MM  DX  E11.9  . JANUVIA 100 MG tablet TAKE 1 TABLET DAILY  . LANTUS SOLOSTAR 100 UNIT/ML Solostar Pen INJECT 30 UNITS            SUBCUTANEOUSLY TWO TIMES A DAY  . liraglutide (VICTOZA) 18 MG/3ML SOPN INJECT 0.3ML (=1.8MG ) INTO THE SKIN DAILY  . magnesium oxide (MAG-OX) 400 MG tablet TAKE 1 TABLET (400 MG TOTAL) BY MOUTH 2 (TWO) TIMES DAILY.  . metoprolol tartrate (LOPRESSOR) 100 MG tablet TAKE 1 TABLET (100 MG TOTAL) BY MOUTH 2 (TWO) TIMES DAILY.  . Multiple Vitamins-Minerals (CENTRUM SILVER 50+MEN) TABS Take by mouth daily.  [name] NOVOTWIST 32G X 5 MM MISC USE 6 TIMES A DAY DX E11.9  . Omega-3 Fatty Acids (FISH OIL) 1200 MG CAPS Take 1,200 mg by mouth 2 (two) times daily.   [name] oxyCODONE (OXYCONTIN) 40 mg 12 hr tablet Take 1 tablet (40 mg total) by mouth every 8 (eight) hours as needed.  . potassium chloride SA (K-DUR,KLOR-CON) 20 MEQ tablet Take 20 mEq by mouth 2 (two) times daily.  . pregabalin (LYRICA) 200 MG capsule Take 1 capsule (200 mg total) by mouth 3 (three) times daily.  . ranitidine (ZANTAC) 300 MG tablet Take 1 tablet (300 mg total) by mouth daily.  . sucralfate (CARAFATE) 1 g tablet TAKE 1 TABLET (1 G TOTAL) BY MOUTH 4 (FOUR) TIMES DAILY - WITH MEALS AND AT BEDTIME.  [name] testosterone cypionate (DEPOTESTOSTERONE CYPIONATE) 200 MG/ML injection Inject 200 mg into the muscle every 21 ( twenty-one) days.   . vancomycin 1,750 mg in sodium chloride 0.9 % 500 mL 1,750 mg daily.  [name] alfuzosin (UROXATRAL) 10 MG 24 hr tablet Take 10 mg by mouth daily with breakfast.  . amoxicillin-clavulanate (AUGMENTIN) 875-125 MG tablet Take 1 tablet by mouth 2 (two) times daily.   No facility-administered encounter medications on file  as of 10/05/2018.     Activities of Daily Living In your present state of health, do you have any difficulty performing the following activities: 10/05/2018 04/22/2018  Hearing? N N  Vision? N N  Difficulty concentrating or making decisions? Y N  Walking or climbing stairs? Y N   Comment Due to back pain. -  Dressing or bathing? N N  Doing errands, shopping? N N  Preparing Food and eating ? N -  Using the Toilet? N -  In the past six months, have you accidently leaked urine? N -  Do you have problems with loss of bowel control? N -  Managing your Medications? Y -  Comment Wife assists with meds.  -  Managing your Finances? N -  Housekeeping or managing your Housekeeping? N -  Some recent data might be hidden    Patient Care Team: Jerrol Banana., MD as PCP - General (Unknown Physician Specialty) Minna Merritts, MD as Consulting Physician (Cardiology) Leandrew Koyanagi, MD as Referring Physician (Ophthalmology) Glenna Fellows, MD as Attending Physician (Neurosurgery) Gaynelle Arabian, MD as Consulting Physician (Orthopedic Surgery) Landis Martins, DPM as Consulting Physician (Podiatry) Royston Cowper, MD as Consulting Physician (Urology)   Assessment:   This is a routine wellness examination for Jabri.  Exercise Activities and Dietary recommendations Current Exercise Habits: The patient does not participate in regular exercise at present, Exercise limited by: orthopedic condition(s);neurologic condition(s)  Goals    . Cut out extra servings     Recommend cutting out extra servings at night time.     . Cut out extra servings     Continue cutting out any extra servings after 8 pm and cutting out all sodas in diet.        Fall Risk Fall Risk  10/05/2018 09/14/2017 08/31/2017 07/18/2017 01/12/2016  Falls in the past year? Yes Yes Yes Yes Yes  Comment - - - Emmi Telephone Survey: data to providers prior to load -  Number falls in past yr: 2 or more 2 or more 2 or more 2 or more 1  Comment - - - Emmi Telephone Survey Actual Response = 2 -  Injury with Fall? Yes Yes Yes No No  Comment Had to have right knee sx. - knee gave out, pt has had total knee replacement sx and is doing much better - -  Risk Factor Category  - - High Fall Risk - -  Risk for  fall due to : - - Impaired mobility - -  Follow up Falls prevention discussed Education provided - - -   FALL RISK PREVENTION PERTAINING TO THE HOME:  Any stairs in or around the home WITH handrails? No  Home free of loose throw rugs in walkways, pet beds, electrical cords, etc? Yes  Adequate lighting in your home to reduce risk of falls? Yes   ASSISTIVE DEVICES UTILIZED TO PREVENT FALLS:  Life alert? No  Use of a cane, walker or w/c? Yes  Grab bars in the bathroom? Yes  Shower chair or bench in shower? Yes  Elevated toilet seat or a handicapped toilet? Yes   DME ORDERS:  DME order needed?  No   TIMED UP AND GO:  Was the test performed? No .    Depression Screen PHQ 2/9 Scores 10/05/2018 08/31/2017 01/12/2016 08/21/2015  PHQ - 2 Score 1 1 0 0    Cognitive Function: Declined today.         Immunization History  Administered Date(s)  Administered  . Influenza, High Dose Seasonal PF 09/18/2015, 10/13/2016, 11/03/2017, 10/05/2018  . Influenza-Unspecified 08/27/2014  . Pneumococcal Conjugate-13 07/10/2014, 08/31/2017  . Pneumococcal Polysaccharide-23 12/02/1999, 12/30/2011, 10/05/2018  . Td 03/31/2005  . Tdap 08/31/2017  . Zoster 06/30/2014    Qualifies for Shingles Vaccine? Yes  Zostavax completed 07/10/14. Due for Shingrix. Education has been provided regarding the importance of this vaccine. Pt has been advised to call insurance company to determine out of pocket expense. Advised may also receive vaccine at local pharmacy or Health Dept. Verbalized acceptance and understanding.  Tdap: Up to date  Flu Vaccine: Due for Flu vaccine. Does the patient want to receive this vaccine today?  Yes .   Pneumococcal Vaccine: Due for Pneumococcal vaccine. Does the patient want to receive this vaccine today?  Yes .   Screening Tests Health Maintenance  Topic Date Due  . OPHTHALMOLOGY EXAM  08/19/2018  . URINE MICROALBUMIN  09/14/2018  . HEMOGLOBIN A1C  12/12/2018  . FOOT EXAM   08/16/2019  . COLONOSCOPY  01/16/2023  . TETANUS/TDAP  09/01/2027  . INFLUENZA VACCINE  Completed  . Hepatitis C Screening  Completed  . PNA vac Low Risk Adult  Completed   Cancer Screenings:  Colorectal Screening: Completed 01/16/18. Repeat every 5 years.  Lung Cancer Screening: (Low Dose CT Chest recommended if Age 34-80 years, 30 pack-year currently smoking OR have quit w/in 15years.) does not qualify.   Additional Screening:  Hepatitis C Screening: Up to date  Vision Screening: Recommended annual ophthalmology exams for early detection of glaucoma and other disorders of the eye.  Dental Screening: Recommended annual dental exams for proper oral hygiene  Community Resource Referral:  CRR required this visit?  No      Plan:  I have personally reviewed and addressed the Medicare Annual Wellness questionnaire and have noted the following in the patient's chart:  A. Medical and social history B. Use of alcohol, tobacco or illicit drugs  C. Current medications and supplements D. Functional ability and status E.  Nutritional status F.  Physical activity G. Advance directives H. List of other physicians I.  Hospitalizations, surgeries, and ER visits in previous 12 months J.  [place] such as hearing and vision if needed, cognitive and depression L. Referrals and appointments - none  In addition, I have reviewed and discussed with patient certain preventive protocols, quality metrics, and best practice recommendations. A written personalized care plan for preventive services as well as general preventive health recommendations were provided to patient.  See attached scanned questionnaire for additional information.   Signed,  Fabio Neighbors, LPN Nurse Health Advisor   Nurse Recommendations: Pt needs a urine check at [facility] today. Pt plans to set up his yearly eye exam by the end of 2019.

## 2018-10-09 ENCOUNTER — Ambulatory Visit: Payer: Self-pay | Admitting: Family Medicine

## 2018-10-14 DIAGNOSIS — Z9989 Dependence on other enabling machines and devices: Secondary | ICD-10-CM

## 2018-10-14 DIAGNOSIS — G4733 Obstructive sleep apnea (adult) (pediatric): Secondary | ICD-10-CM | POA: Insufficient documentation

## 2018-10-17 ENCOUNTER — Encounter: Payer: Medicare Other | Admitting: Family Medicine

## 2018-10-17 ENCOUNTER — Ambulatory Visit: Payer: Medicare Other

## 2018-10-28 ENCOUNTER — Other Ambulatory Visit: Payer: Self-pay | Admitting: Family Medicine

## 2018-10-28 DIAGNOSIS — G629 Polyneuropathy, unspecified: Secondary | ICD-10-CM

## 2018-11-02 ENCOUNTER — Ambulatory Visit (INDEPENDENT_AMBULATORY_CARE_PROVIDER_SITE_OTHER): Payer: Medicare Other | Admitting: Family Medicine

## 2018-11-02 VITALS — BP 144/62 | HR 68 | Temp 98.0°F | Resp 16 | Wt 274.0 lb

## 2018-11-02 DIAGNOSIS — I10 Essential (primary) hypertension: Secondary | ICD-10-CM

## 2018-11-02 DIAGNOSIS — Z9989 Dependence on other enabling machines and devices: Secondary | ICD-10-CM | POA: Diagnosis not present

## 2018-11-02 DIAGNOSIS — E118 Type 2 diabetes mellitus with unspecified complications: Secondary | ICD-10-CM

## 2018-11-02 DIAGNOSIS — E1165 Type 2 diabetes mellitus with hyperglycemia: Secondary | ICD-10-CM

## 2018-11-02 DIAGNOSIS — M170 Bilateral primary osteoarthritis of knee: Secondary | ICD-10-CM

## 2018-11-02 DIAGNOSIS — Z6838 Body mass index (BMI) 38.0-38.9, adult: Secondary | ICD-10-CM

## 2018-11-02 DIAGNOSIS — G629 Polyneuropathy, unspecified: Secondary | ICD-10-CM

## 2018-11-02 DIAGNOSIS — IMO0002 Reserved for concepts with insufficient information to code with codable children: Secondary | ICD-10-CM

## 2018-11-02 DIAGNOSIS — M48061 Spinal stenosis, lumbar region without neurogenic claudication: Secondary | ICD-10-CM

## 2018-11-02 DIAGNOSIS — G4733 Obstructive sleep apnea (adult) (pediatric): Secondary | ICD-10-CM

## 2018-11-02 MED ORDER — OXYCODONE HCL ER 40 MG PO T12A: 40.0000 mg | EXTENDED_RELEASE_TABLET | Freq: Three times a day (TID) | ORAL | 0 refills | Status: DC | PRN

## 2018-11-02 NOTE — Progress Notes (Signed)
[name]    DOB: July 23, 1951   67 y.o.   MRN: [id number] Visit Date: 11/02/2018  Today's Provider: [name], MD   Chief Complaint  Patient presents with  . Diabetes   Subjective:    HPI  Diabetes Mellitus Type II, Follow-up:   Lab Results  Component Value Date   HGBA1C 8.4 (H) 06/12/2018   HGBA1C 6.5 09/14/2017   HGBA1C 5.9 06/09/2017    Last seen for diabetes 4 months ago.  Management since then includes no changes. He reports good compliance with treatment. He is not having side effects.  Current symptoms include none and have been stable. Home blood sugar records: fasting range: 190 today  Episodes of hypoglycemia? no   Most Recent Eye Exam: up to date Weight trend: stable Prior visit with dietician: no Current diet: well balanced Current exercise: no regular exercise  Pertinent Labs:    Component Value Date/Time   CHOL 130 06/12/2018 0949   CHOL 93 04/03/2013 0209   TRIG 297 (H) 06/12/2018 0949   TRIG 163 04/03/2013 0209   HDL 29 (L) 06/12/2018 0949   HDL 25 (L) 04/03/2013 0209   LDLCALC 42 06/12/2018 0949   LDLCALC 35 04/03/2013 0209   CREATININE 1.25 06/12/2018 0949   CREATININE 1.39 (H) 12/13/2017 1134    Wt Readings from Last 3 Encounters:  11/02/18 274 lb (124.3 kg)  10/05/18 278 lb (126.1 kg)  10/05/18 278 lb 3.2 oz (126.2 kg)       Allergies  Allergen Reactions  . Carisoprodol Itching     Current Outpatient Medications:  .  amiodarone (PACERONE) 200 MG tablet, TAKE 1 TABLET BY MOUTH DAILY, Disp: 30 tablet, Rfl: 3 .  aspirin 81 MG tablet, Take 81 mg by mouth daily., Disp: , Rfl:  .  cyclobenzaprine (FLEXERIL) 10 MG tablet, TAKE 1 TABLET BY MOUTH 3 TIMES A DAY, Disp: 90 tablet, Rfl: 12 .  dutasteride (AVODART) 0.5 MG capsule, TAKE 1 CAPSULE DAILY, Disp: 90 capsule, Rfl: 3 .  esomeprazole (NEXIUM) 40 MG capsule, TAKE 1 CAPSULE TWICE DAILY, Disp: 90 capsule, Rfl: 3 .  fexofenadine (ALLEGRA) 180 MG  tablet, Take 180 mg by mouth daily.  , Disp: , Rfl:  .  furosemide (LASIX) 40 MG tablet, Take 1 tablet (40 mg) by mouth once daily, you may take 1 extra tablet (40 mg) after lunch as needed for leg swelling/ abdominal tightness, Disp: , Rfl:  .  gabapentin (NEURONTIN) 100 MG capsule, TAKE 1 CAPSULE BY MOUTH TWICE A DAY, Disp: 180 capsule, Rfl: 2 .  glucose blood (ONE TOUCH ULTRA TEST) test strip, CHECK SUGAR TWICE DAILY. DX- E11.8, Disp: 100 each, Rfl: 3 .  hydrochlorothiazide (HYDRODIURIL) 12.5 MG tablet, TAKE 2 TABLETS (25 MG TOTAL) BY MOUTH AT BEDTIME., Disp: 90 tablet, Rfl: 3 .  insulin aspart (NOVOLOG FLEXPEN) 100 UNIT/ML FlexPen, Inject 12 Units into the skin 3 (three) times daily with meals., Disp: , Rfl:  .  Insulin Pen Needle (PEN NEEDLES) 31G X 5 MM MISC, 1 each by Does not apply route daily. PATIENT NEEDS NOVA TWIST NEEDLES 5 MM  DX E11.9, Disp: 200 each, Rfl: 3 .  JANUVIA 100 MG tablet, TAKE 1 TABLET DAILY, Disp: 90 tablet, Rfl: 3 .  LANTUS SOLOSTAR 100 UNIT/ML Solostar Pen, INJECT 30 UNITS            SUBCUTANEOUSLY TWO TIMES A DAY, Disp: 45 mL, Rfl: 3 .  liraglutide (  VICTOZA) 18 MG/3ML SOPN, INJECT 0.3ML (=1.8MG ) INTO THE SKIN DAILY, Disp: 27 mL, Rfl: 3 .  magnesium oxide (MAG-OX) 400 MG tablet, TAKE 1 TABLET (400 MG TOTAL) BY MOUTH 2 (TWO) TIMES DAILY., Disp: 180 tablet, Rfl: 3 .  metoprolol tartrate (LOPRESSOR) 100 MG tablet, TAKE 1 TABLET (100 MG TOTAL) BY MOUTH 2 (TWO) TIMES DAILY., Disp: 60 tablet, Rfl: 10 .  Multiple Vitamins-Minerals (CENTRUM SILVER 50+MEN) TABS, Take by mouth daily., Disp: , Rfl:  .  NOVOTWIST 32G X 5 MM MISC, USE 6 TIMES A DAY DX E11.9, Disp: 90 each, Rfl: 9 .  Omega-3 Fatty Acids (FISH OIL) 1200 MG CAPS, Take 1,200 mg by mouth 2 (two) times daily. , Disp: , Rfl:  .  oxyCODONE (OXYCONTIN) 40 mg 12 hr tablet, Take 1 tablet (40 mg total) by mouth every 8 (eight) hours as needed., Disp: 270 tablet, Rfl: 0 .  pregabalin (LYRICA) 200 MG capsule, Take 1 capsule (200  mg total) by mouth 3 (three) times daily., Disp: 270 capsule, Rfl: 3 .  ranitidine (ZANTAC) 300 MG tablet, Take 1 tablet (300 mg total) by mouth daily., Disp: 90 tablet, Rfl: 3 .  sucralfate (CARAFATE) 1 g tablet, TAKE 1 TABLET (1 G TOTAL) BY MOUTH 4 (FOUR) TIMES DAILY - WITH MEALS AND AT BEDTIME., Disp: 120 tablet, Rfl: 11 .  testosterone cypionate (DEPOTESTOSTERONE CYPIONATE) 200 MG/ML injection, Inject 200 mg into the muscle every 21 ( twenty-one) days. , Disp: , Rfl:   Review of Systems  Constitutional: Negative.   HENT: Negative.   Eyes: Negative.   Respiratory: Negative.   Cardiovascular: Negative for chest pain and leg swelling.  Endocrine: Negative for cold intolerance, heat intolerance, polydipsia, polyphagia and polyuria.  Musculoskeletal: Negative.   Allergic/Immunologic: Negative.   Neurological: Positive for weakness. Negative for dizziness, light-headedness and headaches.  Psychiatric/Behavioral: Negative.     Social History   Tobacco Use  . Smoking status: Former Smoker    Packs/day: 2.00    Years: 20.00    Pack years: 40.00    Types: Cigarettes    Last attempt to quit: 12/26/1989    Years since quitting: 28.8  . Smokeless tobacco: Never Used  Substance Use Topics  . Alcohol use: No   Objective:   BP (!) 144/62 (BP Location: Left Arm, Patient Position: Sitting, Cuff Size: Large)   Pulse 68   Temp 98 F (36.7 C)   Resp 16   Wt 274 lb (124.3 kg)   SpO2 98%   BMI 38.22 kg/m  Vitals:   11/02/18 1542  BP: (!) 144/62  Pulse: 68  Resp: 16  Temp: 98 F (36.7 C)  SpO2: 98%  Weight: 274 lb (124.3 kg)     Physical Exam  Constitutional: He is oriented to person, place, and time. He appears well-developed and well-nourished.  HENT:  Head: Normocephalic and atraumatic.  Right Ear: External ear normal.  Left Ear: External ear normal.  Nose: Nose normal.  Eyes: Conjunctivae are normal. No scleral icterus.  Neck: No thyromegaly present.  Cardiovascular:  Normal rate, regular rhythm and normal heart sounds.  Pulmonary/Chest: Effort normal and breath sounds normal.  Abdominal: Soft.  Musculoskeletal: He exhibits no edema.  Neurological: He is alert and oriented to person, place, and time.  Skin: Skin is warm and dry.  Psychiatric: He has a normal mood and affect. His behavior is normal. Judgment and thought content normal.        Assessment & Plan:  1. Diabetes mellitus type 2, uncontrolled, with complications (HCC)  - Hemoglobin A1c - Renal function panel  2. HYPERTENSION, BENIGN   3. Neuropathy   4. Spinal stenosis of lumbar region, unspecified whether neurogenic claudication present   5. Class 2 severe obesity due to excess calories with serious comorbidity and body mass index (BMI) of 38.0 to 38.9 in adult ([place])   6. OSA on CPAP  I have done the exam and reviewed the chart and it is accurate to the best of my knowledge. Development worker, community has been used and  any errors in dictation or transcription are unintentional. Miguel Aschoff M.D. [place], MD  [facility]

## 2018-11-03 DIAGNOSIS — E1165 Type 2 diabetes mellitus with hyperglycemia: Secondary | ICD-10-CM | POA: Diagnosis not present

## 2018-11-03 DIAGNOSIS — E118 Type 2 diabetes mellitus with unspecified complications: Secondary | ICD-10-CM | POA: Diagnosis not present

## 2018-11-04 LAB — RENAL FUNCTION PANEL
Albumin: 4.3 g/dL (ref 3.6–4.8)
BUN/Creatinine Ratio: 16 (ref 10–24)
BUN: 22 mg/dL (ref 8–27)
CO2: 29 mmol/L (ref 20–29)
Calcium: 11 mg/dL — ABNORMAL HIGH (ref 8.6–10.2)
Chloride: 97 mmol/L (ref 96–106)
Creatinine, Ser: 1.38 mg/dL — ABNORMAL HIGH (ref 0.76–1.27)
GFR calc Af Amer: 60 mL/min/{1.73_m2} (ref >59)
GFR calc non Af Amer: 52 mL/min/{1.73_m2} — ABNORMAL LOW (ref >59)
Glucose: 125 mg/dL — ABNORMAL HIGH (ref 65–99)
Phosphorus: 2.8 mg/dL (ref 2.5–4.5)
Potassium: 4.6 mmol/L (ref 3.5–5.2)
Sodium: 141 mmol/L (ref 134–144)

## 2018-11-04 LAB — HEMOGLOBIN A1C
Est. average glucose Bld gHb Est-mCnc: 174 mg/dL
Hgb A1c MFr Bld: 7.7 % — ABNORMAL HIGH (ref 4.8–5.6)

## 2018-11-07 ENCOUNTER — Ambulatory Visit (INDEPENDENT_AMBULATORY_CARE_PROVIDER_SITE_OTHER): Payer: Medicare Other | Admitting: Sports Medicine

## 2018-11-07 ENCOUNTER — Encounter: Payer: Self-pay | Admitting: Sports Medicine

## 2018-11-07 DIAGNOSIS — I739 Peripheral vascular disease, unspecified: Secondary | ICD-10-CM

## 2018-11-07 DIAGNOSIS — M79676 Pain in unspecified toe(s): Secondary | ICD-10-CM | POA: Diagnosis not present

## 2018-11-07 DIAGNOSIS — Z9889 Other specified postprocedural states: Secondary | ICD-10-CM

## 2018-11-07 DIAGNOSIS — B351 Tinea unguium: Secondary | ICD-10-CM | POA: Diagnosis not present

## 2018-11-07 DIAGNOSIS — L603 Nail dystrophy: Secondary | ICD-10-CM

## 2018-11-07 DIAGNOSIS — E1142 Type 2 diabetes mellitus with diabetic polyneuropathy: Secondary | ICD-10-CM

## 2018-11-07 NOTE — Progress Notes (Signed)
Subjective: Jerry Parsons is a 67 y.o. male patient with history of diabetes who presents to office today complaining of long,mildly painful nails  while ambulating in shoes; unable to trim. Patient states that the glucose reading this morning was 192mg /dl. Patient denies any new changes in medication or new problems.  Patient Active Problem List   Diagnosis Date Noted  . OSA on CPAP 10/14/2018  . Lumbar spondylosis 05/05/2018  . Spinal stenosis of lumbar region 05/05/2018  . Constipation, chronic 03/08/2018  . Pain in limb 03/03/2018  . Gastroesophageal reflux disease without esophagitis 01/04/2018  . OA (osteoarthritis) of knee 06/20/2017  . Sepsis due to pneumonia ([place]) 05/07/2017  . Neuropathy 10/13/2016  . Encephalopathy 01/21/2016  . Amblyopia 12/30/2015  . 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 NS (nuclear sclerosis) 12/30/2015  . Pseudoaphakia 12/30/2015  . CVA (cerebral infarction) 09/08/2015  . Dehydration 09/08/2015  . Aspiration pneumonia ([place]) 07/04/2015  . Paroxysmal atrial fibrillation ([place]) 07/04/2015  . Arthritis of knee, degenerative 05/28/2015  . Chronic back pain 09/17/2014  . Leg edema 05/07/2014  . Chronic diastolic CHF (congestive heart failure) ([place]) 05/07/2014  . Campylobacter diarrhea 04/25/2013  . Atrial flutter ([place]) 01/23/2013  . Obesity 05/19/2012  . Hyperlipidemia 11/11/2011  . Diastolic dysfunction 05/39/7673  . SOB (shortness of breath) 04/12/2011  . Diabetes mellitus type 2, uncontrolled, with complications ([place]) 41/93/7902  . HYPERTENSION, BENIGN 03/15/2011  . DVT 03/15/2011  . TACHYCARDIA 03/15/2011   Current Outpatient Medications on File Prior to Visit  Medication Sig Dispense Refill  . amiodarone (PACERONE) 200 MG tablet TAKE 1 TABLET BY MOUTH DAILY 30 tablet 3  . aspirin 81 MG tablet Take 81 mg by mouth daily.    . cyclobenzaprine (FLEXERIL) 10 MG tablet TAKE 1 TABLET BY MOUTH 3 TIMES A DAY 90 tablet 12  . dutasteride (AVODART) 0.5 MG capsule  TAKE 1 CAPSULE DAILY 90 capsule 3  . esomeprazole (NEXIUM) 40 MG capsule TAKE 1 CAPSULE TWICE DAILY 90 capsule 3  . fexofenadine (ALLEGRA) 180 MG tablet Take 180 mg by mouth daily.      . furosemide (LASIX) 40 MG tablet Take 1 tablet (40 mg) by mouth once daily, you may take 1 extra tablet (40 mg) after lunch as needed for leg swelling/ abdominal tightness    . gabapentin (NEURONTIN) 100 MG capsule TAKE 1 CAPSULE BY MOUTH TWICE A DAY 180 capsule 2  . glucose blood (ONE TOUCH ULTRA TEST) test strip CHECK SUGAR TWICE DAILY. DX- E11.8 100 each 3  . hydrochlorothiazide (HYDRODIURIL) 12.5 MG tablet TAKE 2 TABLETS (25 MG TOTAL) BY MOUTH AT BEDTIME. 90 tablet 3  . insulin aspart (NOVOLOG FLEXPEN) 100 UNIT/ML FlexPen Inject 12 Units into the skin 3 (three) times daily with meals.    . Insulin Pen Needle (PEN NEEDLES) 31G X 5 MM MISC 1 each by Does not apply route daily. PATIENT NEEDS NOVA TWIST NEEDLES 5 MM  DX E11.9 200 each 3  . JANUVIA 100 MG tablet TAKE 1 TABLET DAILY 90 tablet 3  . LANTUS SOLOSTAR 100 UNIT/ML Solostar Pen INJECT 30 UNITS            SUBCUTANEOUSLY TWO TIMES A DAY 45 mL 3  . liraglutide (VICTOZA) 18 MG/3ML SOPN INJECT 0.3ML (=1.8MG ) INTO THE SKIN DAILY 27 mL 3  . magnesium oxide (MAG-OX) 400 MG tablet TAKE 1 TABLET (400 MG TOTAL) BY MOUTH 2 (TWO) TIMES DAILY. 180 tablet 3  . metoprolol tartrate (LOPRESSOR) 100 MG tablet TAKE 1  TABLET (100 MG TOTAL) BY MOUTH 2 (TWO) TIMES DAILY. 60 tablet 10  . Multiple Vitamins-Minerals (CENTRUM SILVER 50+MEN) TABS Take by mouth daily.    [name] NOVOTWIST 32G X 5 MM MISC USE 6 TIMES A DAY DX E11.9 90 each 9  . Omega-3 Fatty Acids (FISH OIL) 1200 MG CAPS Take 1,200 mg by mouth 2 (two) times daily.     [name] oxyCODONE (OXYCONTIN) 40 mg 12 hr tablet Take 1 tablet (40 mg total) by mouth every 8 (eight) hours as needed. 270 tablet 0  . pregabalin (LYRICA) 200 MG capsule Take 1 capsule (200 mg total) by mouth 3 (three) times daily. 270 capsule 3  . ranitidine  (ZANTAC) 300 MG tablet Take 1 tablet (300 mg total) by mouth daily. 90 tablet 3  . sucralfate (CARAFATE) 1 g tablet TAKE 1 TABLET (1 G TOTAL) BY MOUTH 4 (FOUR) TIMES DAILY - WITH MEALS AND AT BEDTIME. 120 tablet 11  . testosterone cypionate (DEPOTESTOSTERONE CYPIONATE) 200 MG/ML injection Inject 200 mg into the muscle every 21 ( twenty-one) days.      No current facility-administered medications on file prior to visit.    Allergies  Allergen Reactions  . Carisoprodol Itching    Recent Results (from the past 2160 hour(s))  Hemoglobin A1c     Status: Abnormal   Collection Time: 11/03/18  2:58 PM  Result Value Ref Range   Hgb A1c MFr Bld 7.7 (H) 4.8 - 5.6 %    Comment:          Prediabetes: 5.7 - 6.4          Diabetes: >6.4          Glycemic control for adults with diabetes: <7.0    Est. average glucose Bld gHb Est-mCnc 174 mg/dL  Renal function panel     Status: Abnormal   Collection Time: 11/03/18  2:58 PM  Result Value Ref Range   Glucose 125 (H) 65 - 99 mg/dL   BUN 22 8 - 27 mg/dL   Creatinine, Ser 1.38 (H) 0.76 - 1.27 mg/dL   GFR calc non Af Amer 52 (L) >59 mL/min/1.73   GFR calc Af Amer 60 >59 mL/min/1.73   BUN/Creatinine Ratio 16 10 - 24   Sodium 141 134 - 144 mmol/L   Potassium 4.6 3.5 - 5.2 mmol/L   Chloride 97 96 - 106 mmol/L   CO2 29 20 - 29 mmol/L   Calcium 11.0 (H) 8.6 - 10.2 mg/dL   Phosphorus 2.8 2.5 - 4.5 mg/dL   Albumin 4.3 3.6 - 4.8 g/dL    Objective: General: Patient is awake, alert, and oriented x 3 and in no acute distress.  Integument: Skin is warm, dry and supple bilateral. Nails are tender, long, thickened and  dystrophic with subungual debris, consistent with onychomycosis, 1-5 bilateral.Callus to right 1st toe with dry heme. No signs of infection. No open lesions or preulcerative lesions present bilateral. Remaining integument unremarkable.  Vasculature:  Dorsalis Pedis pulse 1/4 bilateral. Posterior Tibial pulse  0/4 bilateral.  Capillary fill  time <3 sec 1-5 bilateral. No hair growth to the level of the digits. Temperature gradient within normal limits. No varicosities present bilateral. 1+ pitting edema present bilateral.   Neurology: The patient has absent sensation measured with a 5.07/10g Semmes Weinstein Monofilament at all pedal sites bilateral . Vibratory sensation absent bilateral with tuning fork. No Babinski sign present bilateral.   Musculoskeletal: Asymptomatic hammertoes pedal deformities noted bilateral. Muscular strength 5/5 in all  lower extremity muscular groups bilateral without pain on range of motion . No tenderness with calf compression bilateral.  Assessment and Plan: Problem List Items Addressed This Visit    None    Visit Diagnoses    S/P nail surgery    -  Primary   Onychodystrophy       Diabetic polyneuropathy associated with type 2 diabetes mellitus ([place])       PAD (peripheral artery disease) ([place])         -Examined patient. -Discussed and educated patient on diabetic foot care, especially with  regards to the vascular, neurological and musculoskeletal systems.  -Stressed the importance of good glycemic control and the detriment of not  controlling glucose levels in relation to the foot. -Mechanically debrided callus at right 1st toe at no charge with no opening underneath and debrided all nails 1-5 bilateral using sterile nail nipper and filed with dremel without incident  -Answered all patient questions -Patient to return  in 2.5 months for at risk foot care -Patient advised to call the office if any problems or questions arise in the meantime.  Landis Martins, DPM

## 2018-11-09 ENCOUNTER — Other Ambulatory Visit: Payer: Self-pay

## 2018-11-09 DIAGNOSIS — E291 Testicular hypofunction: Secondary | ICD-10-CM | POA: Diagnosis not present

## 2018-11-09 DIAGNOSIS — Z79899 Other long term (current) drug therapy: Secondary | ICD-10-CM | POA: Diagnosis not present

## 2018-11-09 DIAGNOSIS — N5201 Erectile dysfunction due to arterial insufficiency: Secondary | ICD-10-CM | POA: Diagnosis not present

## 2018-11-09 DIAGNOSIS — N401 Enlarged prostate with lower urinary tract symptoms: Secondary | ICD-10-CM | POA: Diagnosis not present

## 2018-11-09 MED ORDER — OXYCODONE HCL ER 40 MG PO T12A: 40.0000 mg | EXTENDED_RELEASE_TABLET | Freq: Three times a day (TID) | ORAL | 0 refills | Status: DC | PRN

## 2018-11-09 NOTE — Telephone Encounter (Signed)
Patients wife states this prescription was sent to the wrong pharmacy last week. She states it was supposed to have been sent to [facility] mail order. Please send prescription to mail order pharmacy.

## 2018-11-11 DIAGNOSIS — M170 Bilateral primary osteoarthritis of knee: Secondary | ICD-10-CM | POA: Insufficient documentation

## 2018-11-13 ENCOUNTER — Other Ambulatory Visit: Payer: Self-pay | Admitting: Family Medicine

## 2018-11-13 ENCOUNTER — Other Ambulatory Visit: Payer: Self-pay

## 2018-11-14 ENCOUNTER — Other Ambulatory Visit: Payer: Self-pay | Admitting: Family Medicine

## 2018-11-17 DIAGNOSIS — E291 Testicular hypofunction: Secondary | ICD-10-CM | POA: Diagnosis not present

## 2018-11-25 ENCOUNTER — Other Ambulatory Visit: Payer: Self-pay | Admitting: Family Medicine

## 2018-11-25 MED ORDER — DOXYCYCLINE HYCLATE 100 MG PO TABS: 100.0000 mg | ORAL_TABLET | Freq: Two times a day (BID) | ORAL | 0 refills | Status: DC

## 2018-11-25 NOTE — Progress Notes (Signed)
Recurrent bronchitis.

## 2018-11-28 ENCOUNTER — Other Ambulatory Visit: Payer: Self-pay | Admitting: Cardiovascular Disease

## 2018-12-01 ENCOUNTER — Other Ambulatory Visit: Payer: Self-pay | Admitting: Family Medicine

## 2018-12-07 ENCOUNTER — Other Ambulatory Visit: Payer: Self-pay | Admitting: Family Medicine

## 2018-12-07 NOTE — Telephone Encounter (Signed)
[facility] [address], faxed refill request for the following medications:  furosemide (LASIX) 40 MG tablet  90 day supply  Last Rx: 12/30/17 LOV: 11/02/18 Please advise. Thanks TNP

## 2018-12-08 ENCOUNTER — Other Ambulatory Visit: Payer: Self-pay | Admitting: Family Medicine

## 2018-12-08 MED ORDER — FUROSEMIDE 40 MG PO TABS: ORAL_TABLET | ORAL | 2 refills | Status: DC

## 2018-12-22 DIAGNOSIS — D2271 Melanocytic nevi of right lower limb, including hip: Secondary | ICD-10-CM | POA: Diagnosis not present

## 2018-12-22 DIAGNOSIS — X32XXXA Exposure to sunlight, initial encounter: Secondary | ICD-10-CM | POA: Diagnosis not present

## 2018-12-22 DIAGNOSIS — L821 Other seborrheic keratosis: Secondary | ICD-10-CM | POA: Diagnosis not present

## 2018-12-22 DIAGNOSIS — L570 Actinic keratosis: Secondary | ICD-10-CM | POA: Diagnosis not present

## 2018-12-22 DIAGNOSIS — D225 Melanocytic nevi of trunk: Secondary | ICD-10-CM | POA: Diagnosis not present

## 2018-12-22 DIAGNOSIS — D2272 Melanocytic nevi of left lower limb, including hip: Secondary | ICD-10-CM | POA: Diagnosis not present

## 2018-12-22 DIAGNOSIS — D2262 Melanocytic nevi of left upper limb, including shoulder: Secondary | ICD-10-CM | POA: Diagnosis not present

## 2018-12-22 DIAGNOSIS — D2261 Melanocytic nevi of right upper limb, including shoulder: Secondary | ICD-10-CM | POA: Diagnosis not present

## 2019-01-03 DIAGNOSIS — Z6838 Body mass index (BMI) 38.0-38.9, adult: Secondary | ICD-10-CM | POA: Diagnosis not present

## 2019-01-03 DIAGNOSIS — M4716 Other spondylosis with myelopathy, lumbar region: Secondary | ICD-10-CM | POA: Diagnosis not present

## 2019-01-09 ENCOUNTER — Ambulatory Visit (INDEPENDENT_AMBULATORY_CARE_PROVIDER_SITE_OTHER): Payer: Medicare Other | Admitting: Sports Medicine

## 2019-01-09 ENCOUNTER — Encounter: Payer: Self-pay | Admitting: Sports Medicine

## 2019-01-09 DIAGNOSIS — E1142 Type 2 diabetes mellitus with diabetic polyneuropathy: Secondary | ICD-10-CM

## 2019-01-09 DIAGNOSIS — M79675 Pain in left toe(s): Secondary | ICD-10-CM

## 2019-01-09 DIAGNOSIS — M79674 Pain in right toe(s): Secondary | ICD-10-CM

## 2019-01-09 DIAGNOSIS — I739 Peripheral vascular disease, unspecified: Secondary | ICD-10-CM

## 2019-01-09 DIAGNOSIS — Z9889 Other specified postprocedural states: Secondary | ICD-10-CM

## 2019-01-09 DIAGNOSIS — B351 Tinea unguium: Secondary | ICD-10-CM

## 2019-01-09 NOTE — Progress Notes (Signed)
Subjective: Jerry Parsons is a 68 y.o. male patient with history of diabetes who presents to office today complaining of long,mildly painful nails  while ambulating in shoes; unable to trim. Patient states that the glucose reading this morning was 220mg /dl. Patient denies any new changes in medication or new problems.  Last visit to PCP was in November and last A1c was recorded at 7.7.  Patient Active Problem List   Diagnosis Date Noted  . Osteoarthritis of both knees 11/11/2018  . OSA on CPAP 10/14/2018  . Lumbar spondylosis 05/05/2018  . Spinal stenosis of lumbar region 05/05/2018  . Constipation, chronic 03/08/2018  . Pain in limb 03/03/2018  . Gastroesophageal reflux disease without esophagitis 01/04/2018  . OA (osteoarthritis) of knee 06/20/2017  . Sepsis due to pneumonia ([place]) 05/07/2017  . Neuropathy 10/13/2016  . Amblyopia 12/30/2015  . 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 NS (nuclear sclerosis) 12/30/2015  . Pseudoaphakia 12/30/2015  . Dehydration 09/08/2015  . Aspiration pneumonia ([place]) 07/04/2015  . Paroxysmal atrial fibrillation ([place]) 07/04/2015  . Arthritis of knee, degenerative 05/28/2015  . Chronic back pain 09/17/2014  . Leg edema 05/07/2014  . Chronic diastolic CHF (congestive heart failure) ([place]) 05/07/2014  . Campylobacter diarrhea 04/25/2013  . Atrial flutter ([place]) 01/23/2013  . Obesity 05/19/2012  . Hyperlipidemia 11/11/2011  . Diastolic dysfunction 29/92/4268  . SOB (shortness of breath) 04/12/2011  . Diabetes mellitus type 2, uncontrolled, with complications ([place]) 34/19/6222  . HYPERTENSION, BENIGN 03/15/2011  . DVT 03/15/2011  . TACHYCARDIA 03/15/2011   Current Outpatient Medications on File Prior to Visit  Medication Sig Dispense Refill  . amiodarone (PACERONE) 200 MG tablet TAKE 1 TABLET BY MOUTH DAILY 90 tablet 1  . aspirin 81 MG tablet Take 81 mg by mouth daily.    . cyclobenzaprine (FLEXERIL) 10 MG tablet TAKE 1 TABLET BY MOUTH 3 TIMES A DAY 90 tablet  12  . doxycycline (VIBRA-TABS) 100 MG tablet Take 1 tablet (100 mg total) by mouth 2 (two) times daily. 20 tablet 0  . dutasteride (AVODART) 0.5 MG capsule TAKE 1 CAPSULE DAILY 90 capsule 3  . esomeprazole (NEXIUM) 40 MG capsule TAKE 1 CAPSULE TWICE DAILY 90 capsule 3  . fexofenadine (ALLEGRA) 180 MG tablet Take 180 mg by mouth daily.      . furosemide (LASIX) 40 MG tablet TAKE 1 TABLET BY MOUTH EVERY DAY 90 tablet 3  . gabapentin (NEURONTIN) 100 MG capsule TAKE 1 CAPSULE BY MOUTH TWICE A DAY 180 capsule 2  . glucose blood (ONE TOUCH ULTRA TEST) test strip CHECK SUGAR TWICE DAILY. DX- E11.8 100 each 3  . hydrochlorothiazide (HYDRODIURIL) 12.5 MG tablet TAKE 2 TABLETS (25 MG TOTAL) BY MOUTH AT BEDTIME. 90 tablet 3  . insulin aspart (NOVOLOG FLEXPEN) 100 UNIT/ML FlexPen Inject 12 Units into the skin 3 (three) times daily with meals.    . Insulin Pen Needle (PEN NEEDLES) 31G X 5 MM MISC 1 each by Does not apply route daily. PATIENT NEEDS NOVA TWIST NEEDLES 5 MM  DX E11.9 200 each 3  . JANUVIA 100 MG tablet TAKE 1 TABLET DAILY 90 tablet 3  . LANTUS SOLOSTAR 100 UNIT/ML Solostar Pen INJECT 30 UNITS            SUBCUTANEOUSLY TWO TIMES A DAY 45 mL 3  . liraglutide (VICTOZA) 18 MG/3ML SOPN INJECT 0.3ML (=1.8MG )      SUBCUTANEOUSLY DAILY 27 mL 3  . magnesium oxide (MAG-OX) 400 MG tablet TAKE 1 TABLET (400 MG TOTAL)  BY MOUTH 2 (TWO) TIMES DAILY. 180 tablet 3  . metoprolol tartrate (LOPRESSOR) 100 MG tablet TAKE 1 TABLET (100 MG TOTAL) BY MOUTH 2 (TWO) TIMES DAILY. 60 tablet 10  . Multiple Vitamins-Minerals (CENTRUM SILVER 50+MEN) TABS Take by mouth daily.    [name] NOVOTWIST 32G X 5 MM MISC USE 6 TIMES A DAY DX E11.9 100 each 9  . Omega-3 Fatty Acids (FISH OIL) 1200 MG CAPS Take 1,200 mg by mouth 2 (two) times daily.     [name] oxyCODONE (OXYCONTIN) 40 mg 12 hr tablet Take 1 tablet (40 mg total) by mouth every 8 (eight) hours as needed. 270 tablet 0  . pregabalin (LYRICA) 200 MG capsule Take 1 capsule (200 mg total)  by mouth 3 (three) times daily. 270 capsule 3  . ranitidine (ZANTAC) 300 MG tablet Take 1 tablet (300 mg total) by mouth daily. 90 tablet 3  . sucralfate (CARAFATE) 1 g tablet TAKE 1 TABLET (1 G TOTAL) BY MOUTH 4 (FOUR) TIMES DAILY - WITH MEALS AND AT BEDTIME. 120 tablet 11  . testosterone cypionate (DEPOTESTOSTERONE CYPIONATE) 200 MG/ML injection Inject 200 mg into the muscle every 21 ( twenty-one) days.      No current facility-administered medications on file prior to visit.    Allergies  Allergen Reactions  . Carisoprodol Itching    Recent Results (from the past 2160 hour(s))  Hemoglobin A1c     Status: Abnormal   Collection Time: 11/03/18  2:58 PM  Result Value Ref Range   Hgb A1c MFr Bld 7.7 (H) 4.8 - 5.6 %    Comment:          Prediabetes: 5.7 - 6.4          Diabetes: >6.4          Glycemic control for adults with diabetes: <7.0    Est. average glucose Bld gHb Est-mCnc 174 mg/dL  Renal function panel     Status: Abnormal   Collection Time: 11/03/18  2:58 PM  Result Value Ref Range   Glucose 125 (H) 65 - 99 mg/dL   BUN 22 8 - 27 mg/dL   Creatinine, Ser 1.38 (H) 0.76 - 1.27 mg/dL   GFR calc non Af Amer 52 (L) >59 mL/min/1.73   GFR calc Af Amer 60 >59 mL/min/1.73   BUN/Creatinine Ratio 16 10 - 24   Sodium 141 134 - 144 mmol/L   Potassium 4.6 3.5 - 5.2 mmol/L   Chloride 97 96 - 106 mmol/L   CO2 29 20 - 29 mmol/L   Calcium 11.0 (H) 8.6 - 10.2 mg/dL   Phosphorus 2.8 2.5 - 4.5 mg/dL   Albumin 4.3 3.6 - 4.8 g/dL    Objective: General: Patient is awake, alert, and oriented x 3 and in no acute distress.  Integument: Skin is warm, dry and supple bilateral. Nails are tender, long, thickened and  dystrophic with subungual debris, consistent with onychomycosis, 1-5 bilateral.Callus to right 1st toe with dry heme. No signs of infection. No open lesions or preulcerative lesions present bilateral. Remaining integument unremarkable.  Vasculature:  Dorsalis Pedis pulse 1/4  bilateral. Posterior Tibial pulse  0/4 bilateral.  Capillary fill time <3 sec 1-5 bilateral. No hair growth to the level of the digits. Temperature gradient within normal limits. No varicosities present bilateral. 1+ pitting edema present bilateral.   Neurology: The patient has absent sensation measured with a 5.07/10g Semmes Weinstein Monofilament at all pedal sites bilateral . Vibratory sensation absent bilateral with tuning fork.  No Babinski sign present bilateral.   Musculoskeletal: Asymptomatic hammertoes pedal deformities noted bilateral. Muscular strength 5/5 in all lower extremity muscular groups bilateral without pain on range of motion . No tenderness with calf compression bilateral.  Assessment and Plan: Problem List Items Addressed This Visit    None    Visit Diagnoses    Pain due to onychomycosis of toenails of both feet    -  Primary   S/P nail surgery       Diabetic polyneuropathy associated with type 2 diabetes mellitus ([facility])       PAD (peripheral artery disease) ([facility])       Relevant Medications   tadalafil (CIALIS) 5 MG tablet      -Examined patient. -Discussed and educated patient on diabetic foot care, especially with  regards to the vascular, neurological and musculoskeletal systems.  -Stressed the importance of good glycemic control and the detriment of not  controlling glucose levels in relation to the foot. -Mechanically debrided callus at right 1st toe at no charge with no opening underneath and debrided all nails 1-5 bilateral using sterile nail nipper and filed with dremel without incident  -Answered all patient questions -Patient to return  in 2.5 months for at risk foot care -Patient advised to call the office if any problems or questions arise in the meantime.  Landis Martins, DPM

## 2019-01-12 DIAGNOSIS — E119 Type 2 diabetes mellitus without complications: Secondary | ICD-10-CM | POA: Diagnosis not present

## 2019-01-12 LAB — HM DIABETES EYE EXAM

## 2019-01-17 DIAGNOSIS — E291 Testicular hypofunction: Secondary | ICD-10-CM | POA: Diagnosis not present

## 2019-01-22 ENCOUNTER — Other Ambulatory Visit: Payer: Self-pay | Admitting: Family Medicine

## 2019-01-22 DIAGNOSIS — Z794 Long term (current) use of insulin: Principal | ICD-10-CM

## 2019-01-22 DIAGNOSIS — IMO0002 Reserved for concepts with insufficient information to code with codable children: Secondary | ICD-10-CM

## 2019-01-22 DIAGNOSIS — E1165 Type 2 diabetes mellitus with hyperglycemia: Secondary | ICD-10-CM

## 2019-01-22 DIAGNOSIS — E118 Type 2 diabetes mellitus with unspecified complications: Principal | ICD-10-CM

## 2019-01-27 ENCOUNTER — Other Ambulatory Visit: Payer: Self-pay | Admitting: Family Medicine

## 2019-02-05 ENCOUNTER — Ambulatory Visit (INDEPENDENT_AMBULATORY_CARE_PROVIDER_SITE_OTHER): Payer: Medicare Other | Admitting: Family Medicine

## 2019-02-05 ENCOUNTER — Encounter: Payer: Self-pay | Admitting: Family Medicine

## 2019-02-05 ENCOUNTER — Other Ambulatory Visit: Payer: Self-pay

## 2019-02-05 VITALS — BP 128/48 | HR 65 | Temp 98.5°F | Ht 71.0 in | Wt 286.0 lb

## 2019-02-05 DIAGNOSIS — Z9989 Dependence on other enabling machines and devices: Secondary | ICD-10-CM

## 2019-02-05 DIAGNOSIS — G629 Polyneuropathy, unspecified: Secondary | ICD-10-CM | POA: Diagnosis not present

## 2019-02-05 DIAGNOSIS — E1165 Type 2 diabetes mellitus with hyperglycemia: Secondary | ICD-10-CM | POA: Diagnosis not present

## 2019-02-05 DIAGNOSIS — M791 Myalgia, unspecified site: Secondary | ICD-10-CM | POA: Diagnosis not present

## 2019-02-05 DIAGNOSIS — G4733 Obstructive sleep apnea (adult) (pediatric): Secondary | ICD-10-CM | POA: Diagnosis not present

## 2019-02-05 DIAGNOSIS — M170 Bilateral primary osteoarthritis of knee: Secondary | ICD-10-CM

## 2019-02-05 DIAGNOSIS — M5441 Lumbago with sciatica, right side: Secondary | ICD-10-CM | POA: Diagnosis not present

## 2019-02-05 DIAGNOSIS — G8929 Other chronic pain: Secondary | ICD-10-CM

## 2019-02-05 DIAGNOSIS — E118 Type 2 diabetes mellitus with unspecified complications: Secondary | ICD-10-CM | POA: Diagnosis not present

## 2019-02-05 DIAGNOSIS — IMO0002 Reserved for concepts with insufficient information to code with codable children: Secondary | ICD-10-CM

## 2019-02-05 DIAGNOSIS — Z6838 Body mass index (BMI) 38.0-38.9, adult: Secondary | ICD-10-CM | POA: Diagnosis not present

## 2019-02-05 DIAGNOSIS — E782 Mixed hyperlipidemia: Secondary | ICD-10-CM

## 2019-02-05 DIAGNOSIS — M5442 Lumbago with sciatica, left side: Secondary | ICD-10-CM | POA: Diagnosis not present

## 2019-02-05 DIAGNOSIS — Z794 Long term (current) use of insulin: Secondary | ICD-10-CM

## 2019-02-05 MED ORDER — OXYCODONE HCL ER 40 MG PO T12A: 40.0000 mg | EXTENDED_RELEASE_TABLET | Freq: Three times a day (TID) | ORAL | 0 refills | Status: DC | PRN

## 2019-02-05 MED ORDER — INSULIN ASPART 100 UNIT/ML FLEXPEN: 12.0000 [IU] | PEN_INJECTOR | Freq: Three times a day (TID) | SUBCUTANEOUS | Status: DC

## 2019-02-05 MED ORDER — LANTUS SOLOSTAR 100 UNIT/ML ~~LOC~~ SOPN: 35.0000 [IU] | PEN_INJECTOR | Freq: Two times a day (BID) | SUBCUTANEOUS | 3 refills | Status: DC

## 2019-02-05 MED ORDER — PREGABALIN 200 MG PO CAPS: 200.0000 mg | ORAL_CAPSULE | Freq: Three times a day (TID) | ORAL | 3 refills | Status: DC

## 2019-02-05 MED ORDER — LIRAGLUTIDE 18 MG/3ML ~~LOC~~ SOPN: PEN_INJECTOR | SUBCUTANEOUS | 3 refills | Status: DC

## 2019-02-05 NOTE — Progress Notes (Signed)
[name]    DOB: 07/01/1951   68 y.o.   MRN: [id number] Visit Date: 02/05/2019  Today's Provider: [name], MD   Chief Complaint  Patient presents with  . Diabetes    3 month fup   Subjective:     HPI   Diabetes Mellitus Type II, Follow-up:   Lab Results  Component Value Date   HGBA1C 7.7 (H) 11/03/2018   HGBA1C 8.4 (H) 06/12/2018   HGBA1C 6.5 09/14/2017    Last seen for diabetes 3 months ago.  Management since then includes no changes. He reports good compliance with treatment. He is not having side effects.  Current symptoms include pain and weakness in both legs, walking and standing and have been worsening. Home blood sugar records: 145's to 180  Episodes of hypoglycemia? no   Current Insulin Regimen: novalog, victoza, lantus Most Recent Eye Exam: 12/27/2018 Weight trend: increasing steadily Prior visit with dietician: no Current diet: on average, 1 to 2  servings vegetables per day Current exercise: no regular exercise  He is trying to cut back on his Dazey but says he still has terrible pain. He states he is having worsening muscle pain and wonders if it could be the gabapentin. Pertinent Labs:    Component Value Date/Time   CHOL 130 06/12/2018 0949   CHOL 93 04/03/2013 0209   TRIG 297 (H) 06/12/2018 0949   TRIG 163 04/03/2013 0209   HDL 29 (L) 06/12/2018 0949   HDL 25 (L) 04/03/2013 0209   LDLCALC 42 06/12/2018 0949   LDLCALC 35 04/03/2013 0209   CREATININE 1.38 (H) 11/03/2018 1458   CREATININE 1.39 (H) 12/13/2017 1134    Wt Readings from Last 3 Encounters:  02/05/19 286 lb (129.7 kg)  11/02/18 274 lb (124.3 kg)  10/05/18 278 lb (126.1 kg)    ------------------------------------------------------------------------   Allergies  Allergen Reactions  . Carisoprodol Itching     Current Outpatient Medications:  .  amiodarone (PACERONE) 200 MG tablet, TAKE 1 TABLET BY MOUTH DAILY, Disp: 90 tablet,  Rfl: 1 .  aspirin 81 MG tablet, Take 81 mg by mouth daily., Disp: , Rfl:  .  cyclobenzaprine (FLEXERIL) 10 MG tablet, TAKE 1 TABLET BY MOUTH 3 TIMES A DAY, Disp: 90 tablet, Rfl: 12 .  Docusate Sodium (STOOL SOFTENER LAXATIVE PO), Take by mouth., Disp: , Rfl:  .  dutasteride (AVODART) 0.5 MG capsule, TAKE 1 CAPSULE DAILY, Disp: 90 capsule, Rfl: 3 .  esomeprazole (NEXIUM) 40 MG capsule, TAKE 1 CAPSULE TWICE DAILY, Disp: 90 capsule, Rfl: 3 .  fexofenadine (ALLEGRA) 180 MG tablet, Take 180 mg by mouth daily.  , Disp: , Rfl:  .  furosemide (LASIX) 40 MG tablet, TAKE 1 TABLET BY MOUTH EVERY DAY, Disp: 90 tablet, Rfl: 3 .  gabapentin (NEURONTIN) 100 MG capsule, TAKE 1 CAPSULE BY MOUTH TWICE A DAY, Disp: 180 capsule, Rfl: 2 .  glucose blood test strip, Use with meter twice daily, Disp: 100 each, Rfl: 3 .  hydrochlorothiazide (HYDRODIURIL) 12.5 MG tablet, TAKE 2 TABLETS (25 MG TOTAL) BY MOUTH AT BEDTIME., Disp: 90 tablet, Rfl: 3 .  insulin aspart (NOVOLOG FLEXPEN) 100 UNIT/ML FlexPen, Inject 12 Units into the skin 3 (three) times daily with meals., Disp: , Rfl:  .  Insulin Pen Needle (PEN NEEDLES) 31G X 5 MM MISC, 1 each by Does not apply route daily. PATIENT NEEDS NOVA TWIST NEEDLES 5 MM  DX E11.9, Disp: 200  each, Rfl: 3 .  JANUVIA 100 MG tablet, TAKE 1 TABLET DAILY, Disp: 90 tablet, Rfl: 3 .  LANTUS SOLOSTAR 100 UNIT/ML Solostar Pen, INJECT 30 UNITS            SUBCUTANEOUSLY TWO TIMES A DAY, Disp: 45 mL, Rfl: 3 .  liraglutide (VICTOZA) 18 MG/3ML SOPN, INJECT 0.3ML (=1.8MG )      SUBCUTANEOUSLY DAILY, Disp: 27 mL, Rfl: 3 .  magnesium oxide (MAG-OX) 400 MG tablet, TAKE 1 TABLET (400 MG TOTAL) BY MOUTH 2 (TWO) TIMES DAILY., Disp: 180 tablet, Rfl: 3 .  metoprolol tartrate (LOPRESSOR) 100 MG tablet, TAKE 1 TABLET (100 MG TOTAL) BY MOUTH 2 (TWO) TIMES DAILY., Disp: 180 tablet, Rfl: 3 .  NOVOTWIST 32G X 5 MM MISC, USE 6 TIMES A DAY DX E11.9, Disp: 100 each, Rfl: 9 .  Omega-3 Fatty Acids (FISH OIL) 1000 MG CAPS,  Take by mouth., Disp: , Rfl:  .  Omega-3 Fatty Acids (FISH OIL) 1200 MG CAPS, Take 1,200 mg by mouth 2 (two) times daily. , Disp: , Rfl:  .  oxyCODONE (OXYCONTIN) 40 mg 12 hr tablet, Take 1 tablet (40 mg total) by mouth every 8 (eight) hours as needed., Disp: 270 tablet, Rfl: 0 .  pregabalin (LYRICA) 200 MG capsule, Take 1 capsule (200 mg total) by mouth 3 (three) times daily., Disp: 270 capsule, Rfl: 3 .  ranitidine (ZANTAC) 300 MG tablet, Take 1 tablet (300 mg total) by mouth daily., Disp: 90 tablet, Rfl: 3 .  sucralfate (CARAFATE) 1 g tablet, TAKE 1 TABLET (1 G TOTAL) BY MOUTH 4 (FOUR) TIMES DAILY - WITH MEALS AND AT BEDTIME., Disp: 120 tablet, Rfl: 11 .  tadalafil (CIALIS) 5 MG tablet, Take 5 mg by mouth daily as needed for erectile dysfunction., Disp: , Rfl:  .  testosterone cypionate (DEPOTESTOSTERONE CYPIONATE) 200 MG/ML injection, Inject 200 mg into the muscle every 21 ( twenty-one) days. , Disp: , Rfl:  .  Multiple Vitamins-Minerals (CENTRUM SILVER 50+MEN) TABS, Take by mouth daily., Disp: , Rfl:  .  Testosterone Cypionate 200 MG/ML SOLN, Inject into the muscle., Disp: , Rfl:   Review of Systems  Constitutional: Negative.   HENT: Negative.   Eyes: Negative.   Respiratory: Negative.   Cardiovascular: Negative for chest pain and leg swelling.  Gastrointestinal: Negative.   Endocrine: Negative for cold intolerance, heat intolerance, polydipsia, polyphagia and polyuria.  Genitourinary: Negative.   Musculoskeletal: Negative.   Allergic/Immunologic: Negative.   Neurological: Positive for weakness. Negative for dizziness, light-headedness and headaches.  Psychiatric/Behavioral: Negative.     Social History   Tobacco Use  . Smoking status: Former Smoker    Packs/day: 2.00    Years: 20.00    Pack years: 40.00    Types: Cigarettes    Last attempt to quit: 12/26/1989    Years since quitting: 29.1  . Smokeless tobacco: Never Used  Substance Use Topics  . Alcohol use: No        Objective:   BP (!) 128/48 (BP Location: Left Arm, Patient Position: Sitting, Cuff Size: Large)   Pulse 65   Temp 98.5 F (36.9 C) (Oral)   Ht 5\' 11"  (1.803 m)   Wt 286 lb (129.7 kg)   SpO2 96%   BMI 39.89 kg/m  Vitals:   02/05/19 1147  BP: (!) 128/48  Pulse: 65  Temp: 98.5 F (36.9 C)  TempSrc: Oral  SpO2: 96%  Weight: 286 lb (129.7 kg)  Height: 5\' 11"  (1.803 m)  Physical Exam Constitutional:      Appearance: He is well-developed.  HENT:     Head: Normocephalic and atraumatic.     Right Ear: External ear normal.     Left Ear: External ear normal.     Nose: Nose normal.  Eyes:     General: No scleral icterus.    Conjunctiva/sclera: Conjunctivae normal.  Neck:     Thyroid: No thyromegaly.  Cardiovascular:     Rate and Rhythm: Normal rate and regular rhythm.     Heart sounds: Normal heart sounds.  Pulmonary:     Effort: Pulmonary effort is normal.     Breath sounds: Normal breath sounds.  Abdominal:     Palpations: Abdomen is soft.  Skin:    General: Skin is warm and dry.  Neurological:     Mental Status: He is alert and oriented to person, place, and time.  Psychiatric:        Behavior: Behavior normal.        Thought Content: Thought content normal.        Judgment: Judgment normal.         Assessment & Plan    1. Diabetes mellitus type 2, uncontrolled, with complications (HCC)  - POCT glycosylated hemoglobin (Hb A1C) - insulin aspart (NOVOLOG FLEXPEN) 100 UNIT/ML FlexPen; Inject 12 Units into the skin 3 (three) times daily with meals.  Dispense: 15 mL - liraglutide (VICTOZA) 18 MG/3ML SOPN; INJECT 0.3ML (=1.8MG )      SUBCUTANEOUSLY DAILY  Dispense: 27 mL; Refill: 3 - Hemoglobin A1c  2. Uncontrolled type 2 diabetes mellitus with complication, with long-term current use of insulin (HCC)  - LANTUS SOLOSTAR 100 UNIT/ML Solostar Pen; Inject 35 Units into the skin 2 (two) times daily.  Dispense: 45 mL; Refill: 3  3. Mixed hyperlipidemia   4.  Myalgia Stop gabapentin and see if this helps. - CK  5. Neuropathy Seems to be improved a little bit on Lyrica.  6. OSA on CPAP Wears CPAP nightly  7. Primary osteoarthritis of both knees   8. Class 2 severe obesity due to excess calories with serious comorbidity and body mass index (BMI) of 38.0 to 38.9 in adult [facility]) Diabetes, arthritis, low back pain.  9. Chronic bilateral low back pain with bilateral sciatica Severe lumbar radiculopathy.  Followed by neurosurgery.  Discussed again with patient and wife and I would like to cut back on his OxyContin.  He has been on this for probably well over 20 years.  More than 50% of visit is spent on counseling and coordination of care.  25 minutes.    I have done the exam and reviewed the above chart and it is accurate to the best of my knowledge. Development worker, community has been used in this note in any air is in the dictation or transcription are unintentional.  [name], MD  [place]

## 2019-02-06 LAB — HEMOGLOBIN A1C
Est. average glucose Bld gHb Est-mCnc: 183 mg/dL
Hgb A1c MFr Bld: 8 % — ABNORMAL HIGH (ref 4.8–5.6)

## 2019-02-06 LAB — CK: Total CK: 68 U/L (ref 24–204)

## 2019-02-12 DIAGNOSIS — N400 Enlarged prostate without lower urinary tract symptoms: Secondary | ICD-10-CM | POA: Diagnosis not present

## 2019-02-12 DIAGNOSIS — E291 Testicular hypofunction: Secondary | ICD-10-CM | POA: Diagnosis not present

## 2019-03-07 ENCOUNTER — Other Ambulatory Visit: Payer: Self-pay | Admitting: Family Medicine

## 2019-03-07 NOTE — Telephone Encounter (Signed)
Pharmacy requesting refills. Thanks!  

## 2019-03-07 NOTE — Telephone Encounter (Signed)
Please change his Zantac to Pepcid at 40 mg

## 2019-03-12 ENCOUNTER — Other Ambulatory Visit: Payer: Self-pay

## 2019-03-12 MED ORDER — FAMOTIDINE 20 MG PO TABS: 20.0000 mg | ORAL_TABLET | Freq: Every day | ORAL | 12 refills | Status: DC

## 2019-03-16 ENCOUNTER — Other Ambulatory Visit: Payer: Self-pay | Admitting: Family Medicine

## 2019-03-20 ENCOUNTER — Ambulatory Visit: Payer: Medicare Other | Admitting: Sports Medicine

## 2019-04-04 ENCOUNTER — Other Ambulatory Visit: Payer: Self-pay | Admitting: Family Medicine

## 2019-04-10 ENCOUNTER — Other Ambulatory Visit: Payer: Self-pay | Admitting: Family Medicine

## 2019-04-17 ENCOUNTER — Other Ambulatory Visit: Payer: Self-pay | Admitting: Family Medicine

## 2019-04-17 DIAGNOSIS — IMO0002 Reserved for concepts with insufficient information to code with codable children: Secondary | ICD-10-CM

## 2019-04-17 DIAGNOSIS — E1165 Type 2 diabetes mellitus with hyperglycemia: Secondary | ICD-10-CM

## 2019-04-17 DIAGNOSIS — E118 Type 2 diabetes mellitus with unspecified complications: Principal | ICD-10-CM

## 2019-04-17 NOTE — Telephone Encounter (Signed)
Patient needs refill on Novolog Flex Pen sent to mail in [facility]

## 2019-04-18 MED ORDER — INSULIN ASPART 100 UNIT/ML FLEXPEN: 12.0000 [IU] | PEN_INJECTOR | Freq: Three times a day (TID) | SUBCUTANEOUS | 5 refills | Status: DC

## 2019-04-18 NOTE — Telephone Encounter (Signed)
Please review. Thanks!  

## 2019-04-30 ENCOUNTER — Other Ambulatory Visit: Payer: Self-pay

## 2019-04-30 DIAGNOSIS — E1165 Type 2 diabetes mellitus with hyperglycemia: Secondary | ICD-10-CM

## 2019-04-30 DIAGNOSIS — IMO0002 Reserved for concepts with insufficient information to code with codable children: Secondary | ICD-10-CM

## 2019-04-30 DIAGNOSIS — E118 Type 2 diabetes mellitus with unspecified complications: Principal | ICD-10-CM

## 2019-04-30 MED ORDER — INSULIN ASPART 100 UNIT/ML FLEXPEN: 12.0000 [IU] | PEN_INJECTOR | Freq: Three times a day (TID) | SUBCUTANEOUS | 5 refills | Status: DC

## 2019-04-30 MED ORDER — OXYCODONE HCL ER 40 MG PO T12A: 40.0000 mg | EXTENDED_RELEASE_TABLET | Freq: Three times a day (TID) | ORAL | 0 refills | Status: DC | PRN

## 2019-04-30 MED ORDER — PREGABALIN 200 MG PO CAPS: 200.0000 mg | ORAL_CAPSULE | Freq: Three times a day (TID) | ORAL | 3 refills | Status: DC

## 2019-04-30 NOTE — Telephone Encounter (Signed)
Please review

## 2019-04-30 NOTE — Telephone Encounter (Signed)
Please review. Thanks!  

## 2019-04-30 NOTE — Telephone Encounter (Signed)
Ok to rf all.

## 2019-04-30 NOTE — Telephone Encounter (Signed)
Patient requesting refill request for the following medication: oxyCODONE (OXYCONTIN) 40 mg 12 hr tablet insulin aspart (NOVOLOG FLEXPEN) 100 UNIT/ML FlexPen  pregabalin (LYRICA) 200 MG capsule  Pharmacy: [facility] service

## 2019-05-01 ENCOUNTER — Ambulatory Visit: Payer: Medicare Other | Admitting: Sports Medicine

## 2019-05-07 ENCOUNTER — Other Ambulatory Visit: Payer: Self-pay | Admitting: Family Medicine

## 2019-05-07 ENCOUNTER — Telehealth: Payer: Self-pay

## 2019-05-07 DIAGNOSIS — IMO0002 Reserved for concepts with insufficient information to code with codable children: Secondary | ICD-10-CM

## 2019-05-07 DIAGNOSIS — E1165 Type 2 diabetes mellitus with hyperglycemia: Secondary | ICD-10-CM

## 2019-05-07 MED ORDER — INSULIN ASPART 100 UNIT/ML FLEXPEN: 12.0000 [IU] | PEN_INJECTOR | Freq: Three times a day (TID) | SUBCUTANEOUS | 3 refills | Status: DC

## 2019-05-07 MED ORDER — PREGABALIN 200 MG PO CAPS: 200.0000 mg | ORAL_CAPSULE | Freq: Three times a day (TID) | ORAL | 3 refills | Status: DC

## 2019-05-07 MED ORDER — OXYCODONE HCL ER 40 MG PO T12A: 40.0000 mg | EXTENDED_RELEASE_TABLET | Freq: Three times a day (TID) | ORAL | 0 refills | Status: DC | PRN

## 2019-05-07 NOTE — Progress Notes (Signed)
refills  

## 2019-05-07 NOTE — Telephone Encounter (Signed)
Patients wife is calling stating that patient needs pre-authorization for his oxycodone prescription, wife states for CMA to call [phone] to start authorization.KW

## 2019-05-08 ENCOUNTER — Ambulatory Visit (INDEPENDENT_AMBULATORY_CARE_PROVIDER_SITE_OTHER): Payer: Medicare Other | Admitting: Sports Medicine

## 2019-05-08 ENCOUNTER — Encounter: Payer: Self-pay | Admitting: Sports Medicine

## 2019-05-08 ENCOUNTER — Other Ambulatory Visit: Payer: Self-pay

## 2019-05-08 VITALS — Temp 98.2°F

## 2019-05-08 DIAGNOSIS — I739 Peripheral vascular disease, unspecified: Secondary | ICD-10-CM

## 2019-05-08 DIAGNOSIS — M79674 Pain in right toe(s): Secondary | ICD-10-CM

## 2019-05-08 DIAGNOSIS — B351 Tinea unguium: Secondary | ICD-10-CM | POA: Diagnosis not present

## 2019-05-08 DIAGNOSIS — M79675 Pain in left toe(s): Secondary | ICD-10-CM

## 2019-05-08 DIAGNOSIS — E1142 Type 2 diabetes mellitus with diabetic polyneuropathy: Secondary | ICD-10-CM

## 2019-05-08 NOTE — Progress Notes (Signed)
Subjective: Jerry Parsons is a 68 y.o. male patient with history of diabetes who presents to office today complaining of long,mildly painful nails  while ambulating in shoes; unable to trim. Patient states that the glucose reading this morning was 170mg /dl. Patient denies any new changes in medication or new problems.  Upcoming visit to PCP this month and last A1c was recorded at 7.  Patient Active Problem List   Diagnosis Date Noted  . Osteoarthritis of both knees 11/11/2018  . OSA on CPAP 10/14/2018  . MRSA bacteremia 05/19/2018  . Lumbar spondylosis 05/05/2018  . Spinal stenosis of lumbar region 05/05/2018  . Constipation, chronic 03/08/2018  . Pain in limb 03/03/2018  . Gastroesophageal reflux disease without esophagitis 01/04/2018  . OA (osteoarthritis) of knee 06/20/2017  . Sepsis due to pneumonia ([place]) 05/07/2017  . Neuropathy 10/13/2016  . Amblyopia 12/30/2015  . 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 NS (nuclear sclerosis) 12/30/2015  . Pseudoaphakia 12/30/2015  . Dehydration 09/08/2015  . Aspiration pneumonia ([place]) 07/04/2015  . Paroxysmal atrial fibrillation ([place]) 07/04/2015  . Arthritis of knee, degenerative 05/28/2015  . Chronic back pain 09/17/2014  . Leg edema 05/07/2014  . Chronic diastolic CHF (congestive heart failure) ([place]) 05/07/2014  . Campylobacter diarrhea 04/25/2013  . Atrial flutter ([place]) 01/23/2013  . Obesity 05/19/2012  . Hyperlipidemia 11/11/2011  . Diastolic dysfunction 46/96/2952  . SOB (shortness of breath) 04/12/2011  . Diabetes mellitus type 2, uncontrolled, with complications ([place]) 84/13/2440  . HYPERTENSION, BENIGN 03/15/2011  . DVT 03/15/2011  . TACHYCARDIA 03/15/2011   Current Outpatient Medications on File Prior to Visit  Medication Sig Dispense Refill  . amiodarone (PACERONE) 200 MG tablet TAKE 1 TABLET BY MOUTH DAILY 90 tablet 1  . aspirin 81 MG tablet Take 81 mg by mouth daily.    . cyclobenzaprine (FLEXERIL) 10 MG tablet TAKE 1 TABLET BY  MOUTH 3 TIMES A DAY 90 tablet 12  . Docusate Sodium (STOOL SOFTENER LAXATIVE PO) Take by mouth.    . dutasteride (AVODART) 0.5 MG capsule TAKE 1 CAPSULE DAILY 90 capsule 3  . esomeprazole (NEXIUM) 40 MG capsule TAKE 1 CAPSULE TWICE DAILY 90 capsule 3  . famotidine (PEPCID) 20 MG tablet Take 1 tablet (20 mg total) by mouth daily. 30 tablet 12  . fexofenadine (ALLEGRA) 180 MG tablet Take 180 mg by mouth daily.      . furosemide (LASIX) 40 MG tablet TAKE 1 TABLET BY MOUTH EVERY DAY 90 tablet 3  . glucose blood test strip Use with meter twice daily 100 each 3  . hydrochlorothiazide (HYDRODIURIL) 12.5 MG tablet TAKE 2 TABLETS (25 MG TOTAL) BY MOUTH AT BEDTIME. 90 tablet 3  . insulin aspart (NOVOLOG FLEXPEN) 100 UNIT/ML FlexPen Inject 12 Units into the skin 3 (three) times daily with meals. 45 mL 3  . Insulin Pen Needle (PEN NEEDLES) 31G X 5 MM MISC 1 each by Does not apply route daily. PATIENT NEEDS NOVA TWIST NEEDLES 5 MM  DX E11.9 200 each 3  . JANUVIA 100 MG tablet TAKE 1 TABLET DAILY 90 tablet 3  . LANTUS SOLOSTAR 100 UNIT/ML Solostar Pen Inject 35 Units into the skin 2 (two) times daily. 45 mL 3  . liraglutide (VICTOZA) 18 MG/3ML SOPN INJECT 0.3ML (=1.8MG )      SUBCUTANEOUSLY DAILY 27 mL 3  . magnesium oxide (MAG-OX) 400 MG tablet TAKE 1 TABLET (400 MG TOTAL) BY MOUTH 2 (TWO) TIMES DAILY. 180 tablet 3  . metoprolol tartrate (LOPRESSOR) 100 MG tablet  TAKE 1 TABLET (100 MG TOTAL) BY MOUTH 2 (TWO) TIMES DAILY. 180 tablet 3  . Multiple Vitamins-Minerals (CENTRUM SILVER 50+MEN) TABS Take by mouth daily.    [name] NOVOTWIST 32G X 5 MM MISC USE 6 TIMES A DAY DX E11.9 100 each 9  . Omega-3 Fatty Acids (FISH OIL) 1000 MG CAPS Take by mouth.    . Omega-3 Fatty Acids (FISH OIL) 1200 MG CAPS Take 1,200 mg by mouth 2 (two) times daily.     [name] oxyCODONE (OXYCONTIN) 40 mg 12 hr tablet Take 1 tablet (40 mg total) by mouth every 8 (eight) hours as needed. 270 tablet 0  . pregabalin (LYRICA) 200 MG capsule Take 1  capsule (200 mg total) by mouth 3 (three) times daily. 270 capsule 3  . ranitidine (ZANTAC) 300 MG tablet TAKE 1 TABLET BY MOUTH EVERY DAY 90 tablet 3  . sucralfate (CARAFATE) 1 g tablet TAKE 1 TABLET (1 G TOTAL) BY MOUTH 4 (FOUR) TIMES DAILY - WITH MEALS AND AT BEDTIME. 120 tablet 11  . tadalafil (CIALIS) 5 MG tablet Take 5 mg by mouth daily as needed for erectile dysfunction.    [name] testosterone cypionate (DEPOTESTOSTERONE CYPIONATE) 200 MG/ML injection Inject 200 mg into the muscle every 21 ( twenty-one) days.     . Testosterone Cypionate 200 MG/ML SOLN Inject into the muscle.     No current facility-administered medications on file prior to visit.    Allergies  Allergen Reactions  . Carisoprodol Itching    No results found for this or any previous visit (from the past 2160 hour(s)).  Objective: General: Patient is awake, alert, and oriented x 3 and in no acute distress.  Integument: Skin is warm, dry and supple bilateral. Nails are tender, long, thickened and dystrophic with subungual debris, consistent with onychomycosis, 1-5 bilateral.Callus to right and left 1st toe with dry heme. No signs of infection. No open lesions or preulcerative lesions present bilateral. Remaining integument unremarkable.  Vasculature:  Dorsalis Pedis pulse 1/4 bilateral. Posterior Tibial pulse  0/4 bilateral.  Capillary fill time <3 sec 1-5 bilateral. No hair growth to the level of the digits. Temperature gradient within normal limits. No varicosities present bilateral. 1+ pitting edema present bilateral.   Neurology: The patient has absent sensation measured with a 5.07/10g Semmes Weinstein Monofilament at all pedal sites bilateral . Vibratory sensation absent bilateral with tuning fork. No Babinski sign present bilateral.   Musculoskeletal: Asymptomatic hammertoes pedal deformities noted bilateral. Muscular strength 5/5 in all lower extremity muscular groups bilateral without pain on range of motion . No  tenderness with calf compression bilateral.  Assessment and Plan: Problem List Items Addressed This Visit    None    Visit Diagnoses    Pain due to onychomycosis of toenails of both feet    -  Primary   Diabetic polyneuropathy associated with type 2 diabetes mellitus (HCC)       PAD (peripheral artery disease) ([place])          -Examined patient. -Discussed and educated patient on diabetic foot care, especially with  regards to the vascular, neurological and musculoskeletal systems.  -Stressed the importance of good glycemic control and the detriment of not  controlling glucose levels in relation to the foot. -Mechanically debrided callus at right and left 1st toes at no charge with no opening underneath and debrided all nails 1-5 bilateral using sterile nail nipper and filed with dremel without incident  -Answered all patient questions -Patient to return  in 2.5 to 3 months for at risk foot care -Patient advised to call the office if any problems or questions arise in the meantime.  Landis Martins, DPM

## 2019-05-10 NOTE — Telephone Encounter (Signed)
Approved through 11/06/2019. Patient wife was advised.

## 2019-05-15 ENCOUNTER — Other Ambulatory Visit: Payer: Self-pay | Admitting: Family Medicine

## 2019-05-15 DIAGNOSIS — R110 Nausea: Secondary | ICD-10-CM

## 2019-05-15 NOTE — Telephone Encounter (Signed)
Please review

## 2019-05-23 ENCOUNTER — Telehealth: Payer: Self-pay | Admitting: Family Medicine

## 2019-05-23 NOTE — Telephone Encounter (Signed)
Pt's wife called about the PA on the generic Nexium.    She said you can call [phone] option 2 for the PA.  Con [place]

## 2019-05-30 ENCOUNTER — Telehealth: Payer: Self-pay

## 2019-05-30 NOTE — Telephone Encounter (Signed)
Called patient.  Left message with person that answered the phone for the patient to give [place] a call back when he was able to.  They said they would relay the message.

## 2019-06-01 NOTE — Telephone Encounter (Signed)
Spoke with Jerry Parsons.  Offered telehealth appointment. Jerry Parsons declined.  Jerry Parsons stated he is doing well with no symptoms and would like to wait to see Dr. Rockey Situ in person.

## 2019-06-03 ENCOUNTER — Other Ambulatory Visit: Payer: Self-pay | Admitting: Family Medicine

## 2019-06-04 NOTE — Telephone Encounter (Signed)
PA approved.

## 2019-06-04 NOTE — Telephone Encounter (Signed)
Please review

## 2019-06-05 ENCOUNTER — Ambulatory Visit: Payer: Self-pay | Admitting: Family Medicine

## 2019-06-06 ENCOUNTER — Telehealth: Payer: Self-pay | Admitting: Family Medicine

## 2019-06-06 NOTE — Telephone Encounter (Signed)
°  Opened in error - [facility] °

## 2019-06-11 ENCOUNTER — Telehealth: Payer: Self-pay

## 2019-06-11 ENCOUNTER — Other Ambulatory Visit: Payer: Self-pay | Admitting: Family Medicine

## 2019-06-11 ENCOUNTER — Ambulatory Visit: Payer: Medicare Other | Admitting: Cardiovascular Disease

## 2019-06-11 MED ORDER — AMIODARONE HCL 200 MG PO TABS: 200.0000 mg | ORAL_TABLET | Freq: Every day | ORAL | 0 refills | Status: DC

## 2019-06-11 NOTE — Telephone Encounter (Signed)
Requested Prescriptions  ° °Signed Prescriptions Disp Refills  ° amiodarone (PACERONE) 200 MG tablet 90 tablet 0  °  Sig: Take 1 tablet (200 mg total) by mouth daily.  °  Authorizing Provider: GOLLAN, TIMOTHY J  °  Ordering User: NEWCOMER MCCLAIN, BRANDY L  ° ° °

## 2019-06-14 ENCOUNTER — Other Ambulatory Visit: Payer: Self-pay

## 2019-06-14 ENCOUNTER — Encounter: Payer: Self-pay | Admitting: Family Medicine

## 2019-06-14 ENCOUNTER — Ambulatory Visit (INDEPENDENT_AMBULATORY_CARE_PROVIDER_SITE_OTHER): Payer: Medicare Other | Admitting: Family Medicine

## 2019-06-14 VITALS — BP 129/72 | HR 71 | Temp 99.5°F | Wt 269.2 lb

## 2019-06-14 DIAGNOSIS — I10 Essential (primary) hypertension: Secondary | ICD-10-CM

## 2019-06-14 DIAGNOSIS — G8929 Other chronic pain: Secondary | ICD-10-CM | POA: Diagnosis not present

## 2019-06-14 DIAGNOSIS — E1165 Type 2 diabetes mellitus with hyperglycemia: Secondary | ICD-10-CM | POA: Diagnosis not present

## 2019-06-14 DIAGNOSIS — I480 Paroxysmal atrial fibrillation: Secondary | ICD-10-CM | POA: Diagnosis not present

## 2019-06-14 DIAGNOSIS — E118 Type 2 diabetes mellitus with unspecified complications: Secondary | ICD-10-CM | POA: Diagnosis not present

## 2019-06-14 DIAGNOSIS — IMO0002 Reserved for concepts with insufficient information to code with codable children: Secondary | ICD-10-CM

## 2019-06-14 DIAGNOSIS — G4733 Obstructive sleep apnea (adult) (pediatric): Secondary | ICD-10-CM

## 2019-06-14 DIAGNOSIS — Z9989 Dependence on other enabling machines and devices: Secondary | ICD-10-CM

## 2019-06-14 DIAGNOSIS — M5442 Lumbago with sciatica, left side: Secondary | ICD-10-CM | POA: Diagnosis not present

## 2019-06-14 DIAGNOSIS — M5441 Lumbago with sciatica, right side: Secondary | ICD-10-CM | POA: Diagnosis not present

## 2019-06-14 LAB — POCT GLYCOSYLATED HEMOGLOBIN (HGB A1C): Hemoglobin A1C: 8.6 % — AB (ref 4.0–5.6)

## 2019-06-14 NOTE — Patient Instructions (Signed)
Increase Lantus to 38 units twice a day.

## 2019-06-14 NOTE — Progress Notes (Signed)
Patient: ANTWANE Parsons Male    DOB: 02/21/1951   68 y.o.   MRN: [id number] Visit Date: 06/14/2019  Today's Provider: [name], MD   Chief Complaint  Patient presents with   Diabetes   Subjective:     HPI  Diabetes Mellitus Type II, Follow-up:   Lab Results  Component Value Date   HGBA1C 8.0 (H) 02/05/2019   HGBA1C 7.7 (H) 11/03/2018   HGBA1C 8.4 (H) 06/12/2018    Last seen for diabetes 4 months ago.  Management since then includes. He reports excellent compliance with treatment. He is not having side effects.  Current symptoms include nausea and paresthesia of the feet and have been worsening. Home blood sugar records: ranging from 140-250  Episodes of hypoglycemia? no   Current Insulin Regimen: Lantus Am& PM, Victoza in AM and Novolog Most Recent Eye Exam: Spring 2020 Weight trend: decreasing steadily Prior visit with dietician: No Current exercise: no regular exercise Current diet habits: in general, a "healthy" diet    Pertinent Labs:    Component Value Date/Time   CHOL 130 06/12/2018 0949   CHOL 93 04/03/2013 0209   TRIG 297 (H) 06/12/2018 0949   TRIG 163 04/03/2013 0209   HDL 29 (L) 06/12/2018 0949   HDL 25 (L) 04/03/2013 0209   LDLCALC 42 06/12/2018 0949   LDLCALC 35 04/03/2013 0209   CREATININE 1.38 (H) 11/03/2018 1458   CREATININE 1.39 (H) 12/13/2017 1134    Wt Readings from Last 3 Encounters:  06/14/19 269 lb 3.2 oz (122.1 kg)  02/05/19 286 lb (129.7 kg)  11/02/18 274 lb (124.3 kg)    ------------------------------------------------------------------------  Allergies  Allergen Reactions   Carisoprodol Itching     Current Outpatient Medications:    amiodarone (PACERONE) 200 MG tablet, Take 1 tablet (200 mg total) by mouth daily., Disp: 90 tablet, Rfl: 0   aspirin 81 MG tablet, Take 81 mg by mouth daily., Disp: , Rfl:    cyclobenzaprine (FLEXERIL) 10 MG tablet, TAKE 1 TABLET BY MOUTH 3 TIMES A DAY, Disp: 90 tablet,  Rfl: 12   Docusate Sodium (STOOL SOFTENER LAXATIVE PO), Take by mouth., Disp: , Rfl:    dutasteride (AVODART) 0.5 MG capsule, TAKE 1 CAPSULE DAILY, Disp: 90 capsule, Rfl: 3   esomeprazole (NEXIUM) 40 MG capsule, TAKE 1 CAPSULE TWICE DAILY, Disp: 90 capsule, Rfl: 3   famotidine (PEPCID) 20 MG tablet, Take 1 tablet (20 mg total) by mouth daily., Disp: 30 tablet, Rfl: 12   fexofenadine (ALLEGRA) 180 MG tablet, Take 180 mg by mouth daily.  , Disp: , Rfl:    furosemide (LASIX) 40 MG tablet, TAKE 1 TABLET (40 MG) BY MOUTH ONCE DAILY, YOU MAY TAKE 1 EXTRA TABLET (40 MG) AFTER LUNCH AS NEEDED FOR LEG SWELLING/ ABDOMINAL TIGHTNESS, Disp: 180 tablet, Rfl: 1   glucose blood test strip, Use with meter twice daily, Disp: 100 each, Rfl: 3   hydrochlorothiazide (HYDRODIURIL) 12.5 MG tablet, TAKE 2 TABLETS (25 MG TOTAL) BY MOUTH AT BEDTIME., Disp: 90 tablet, Rfl: 3   insulin aspart (NOVOLOG FLEXPEN) 100 UNIT/ML FlexPen, Inject 12 Units into the skin 3 (three) times daily with meals., Disp: 45 mL, Rfl: 3   Insulin Pen Needle (PEN NEEDLES) 31G X 5 MM MISC, 1 each by Does not apply route daily. PATIENT NEEDS NOVA TWIST NEEDLES 5 MM  DX E11.9, Disp: 200 each, Rfl: 3   JANUVIA 100 MG tablet, TAKE 1 TABLET DAILY, Disp: 90  tablet, Rfl: 3   LANTUS SOLOSTAR 100 UNIT/ML Solostar Pen, Inject 35 Units into the skin 2 (two) times daily., Disp: 45 mL, Rfl: 3   liraglutide (VICTOZA) 18 MG/3ML SOPN, INJECT 0.3ML (=1.8MG )      SUBCUTANEOUSLY DAILY, Disp: 27 mL, Rfl: 3   magnesium oxide (MAG-OX) 400 MG tablet, TAKE 1 TABLET (400 MG TOTAL) BY MOUTH 2 (TWO) TIMES DAILY., Disp: 180 tablet, Rfl: 3   metoprolol tartrate (LOPRESSOR) 100 MG tablet, TAKE 1 TABLET (100 MG TOTAL) BY MOUTH 2 (TWO) TIMES DAILY., Disp: 180 tablet, Rfl: 3   Multiple Vitamins-Minerals (CENTRUM SILVER 50+MEN) TABS, Take by mouth daily., Disp: , Rfl:    NOVOTWIST 32G X 5 MM MISC, USE 6 TIMES A DAY DX E11.9, Disp: 100 each, Rfl: 9   Omega-3 Fatty  Acids (FISH OIL) 1000 MG CAPS, Take by mouth., Disp: , Rfl:    Omega-3 Fatty Acids (FISH OIL) 1200 MG CAPS, Take 1,200 mg by mouth 2 (two) times daily. , Disp: , Rfl:    oxyCODONE (OXYCONTIN) 40 mg 12 hr tablet, Take 1 tablet (40 mg total) by mouth every 8 (eight) hours as needed., Disp: 270 tablet, Rfl: 0   pregabalin (LYRICA) 200 MG capsule, Take 1 capsule (200 mg total) by mouth 3 (three) times daily., Disp: 270 capsule, Rfl: 3   sucralfate (CARAFATE) 1 g tablet, TAKE 1 TABLET (1 G TOTAL) BY MOUTH 4 (FOUR) TIMES DAILY - WITH MEALS AND AT BEDTIME., Disp: 360 tablet, Rfl: 0   tadalafil (CIALIS) 5 MG tablet, Take 5 mg by mouth daily as needed for erectile dysfunction., Disp: , Rfl:    testosterone cypionate (DEPOTESTOSTERONE CYPIONATE) 200 MG/ML injection, Inject 200 mg into the muscle every 21 ( twenty-one) days. , Disp: , Rfl:    Testosterone Cypionate 200 MG/ML SOLN, Inject into the muscle., Disp: , Rfl:   Review of Systems  Constitutional: Negative.   HENT: Negative.   Eyes: Negative.   Respiratory: Negative.   Cardiovascular: Negative for chest pain and leg swelling.  Gastrointestinal: Negative.   Endocrine: Negative for cold intolerance, heat intolerance, polydipsia, polyphagia and polyuria.  Genitourinary: Negative.   Musculoskeletal: Negative.   Allergic/Immunologic: Negative.   Neurological: Positive for weakness. Negative for dizziness, light-headedness and headaches.  Psychiatric/Behavioral: Negative.   All other systems reviewed and are negative.   Social History   Tobacco Use   Smoking status: Former Smoker    Packs/day: 2.00    Years: 20.00    Pack years: 40.00    Types: Cigarettes    Quit date: 12/26/1989    Years since quitting: 29.4   Smokeless tobacco: Never Used  Substance Use Topics   Alcohol use: No      Objective:   BP 129/72    Pulse 71    Temp 99.5 F (37.5 C) (Oral)    Wt 269 lb 3.2 oz (122.1 kg)    BMI 37.55 kg/m  Vitals:   06/14/19  1612  BP: 129/72  Pulse: 71  Temp: 99.5 F (37.5 C)  TempSrc: Oral  Weight: 269 lb 3.2 oz (122.1 kg)     Physical Exam Vitals signs reviewed.  Constitutional:      Appearance: He is well-developed. He is obese.  HENT:     Head: Normocephalic and atraumatic.     Right Ear: External ear normal.     Left Ear: External ear normal.     Nose: Nose normal.  Eyes:     General: No scleral  icterus.    Conjunctiva/sclera: Conjunctivae normal.  Neck:     Thyroid: No thyromegaly.  Cardiovascular:     Rate and Rhythm: Normal rate and regular rhythm.     Heart sounds: Normal heart sounds.  Pulmonary:     Effort: Pulmonary effort is normal.     Breath sounds: Normal breath sounds.  Abdominal:     Palpations: Abdomen is soft.  Skin:    General: Skin is warm and dry.  Neurological:     Mental Status: He is alert and oriented to person, place, and time. Mental status is at baseline.  Psychiatric:        Mood and Affect: Mood normal.        Behavior: Behavior normal.        Thought Content: Thought content normal.        Judgment: Judgment normal.         Assessment & Plan    1. Diabetes mellitus type 2, uncontrolled, with complications (HCC) 8.6 today. Lifestyle stressed.RTC 3 months.If he does not lose weight will have to up on insulin. - POCT glycosylated hemoglobin (Hb A1C)  2. Paroxysmal atrial fibrillation (HCC)   3. HYPERTENSION, BENIGN   4. OSA on CPAP   5. Chronic bilateral low back pain with bilateral sciatica Try to cut back on chronic narcotics over time. 6.Obesity  7.GERD Stop Zantac.  Shavawn Stobaugh Cranford Mon, MD  [facility] Fritzi Mandes [place],acting as a scribe for [name], MD.,have documented all relevant documentation on the behalf of [name], MD,as directed by  [name], MD while in the presence of [name], MD.

## 2019-06-18 DIAGNOSIS — I10 Essential (primary) hypertension: Secondary | ICD-10-CM | POA: Diagnosis not present

## 2019-06-18 DIAGNOSIS — Z6837 Body mass index (BMI) 37.0-37.9, adult: Secondary | ICD-10-CM | POA: Diagnosis not present

## 2019-06-18 DIAGNOSIS — M545 Low back pain: Secondary | ICD-10-CM | POA: Diagnosis not present

## 2019-06-19 ENCOUNTER — Telehealth: Payer: Self-pay | Admitting: Family Medicine

## 2019-06-19 NOTE — Chronic Care Management (AMB) (Signed)
Chronic Care Management   Note  06/19/2019 Name: Jerry Parsons MRN: [id number] DOB: 01/04/1951  Jerry Parsons is a 68 y.o. year old male who is a primary care patient of Jerrol Banana., MD. I reached out to Jerry Parsons by phone today in response to a referral sent by Jerry Parsons's health plan.    Jerry Parsons was given information about Chronic Care Management services today including:  1. CCM service includes personalized support from designated clinical staff supervised by his physician, including individualized plan of care and coordination with other care providers 2. 24/7 contact phone numbers for assistance for urgent and routine care needs. 3. Service will only be billed when office clinical staff spend 20 minutes or more in a month to coordinate care. 4. Only one practitioner may furnish and bill the service in a calendar month. 5. The patient may stop CCM services at any time (effective at the end of the month) by phone call to the office staff. 6. The patient will be responsible for cost sharing (co-pay) of up to 20% of the service fee (after annual deductible is met).  Patient agreed to services and verbal consent obtained.   Follow up plan: Telephone appointment with CCM team member scheduled for: 06/25/2019  [place]  ??[email or web address]   ??[phone]

## 2019-06-24 IMAGING — US US ABDOMEN LIMITED
1 series · 14 of 25 positions shown · non-contrast
Comparison: 04/02/2013

CLINICAL DATA: Abdominal bloating, nausea, remote cholecystectomy

EXAM:
ULTRASOUND ABDOMEN LIMITED RIGHT UPPER QUADRANT

[Series 1: us abdomen limited · 0.33mm/px · 14 of 47 slices shown]
[im 1/47]
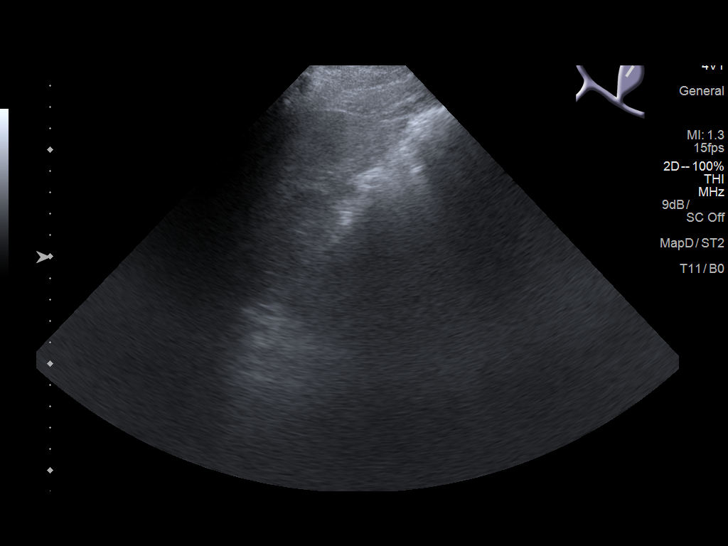
[im 4/47]
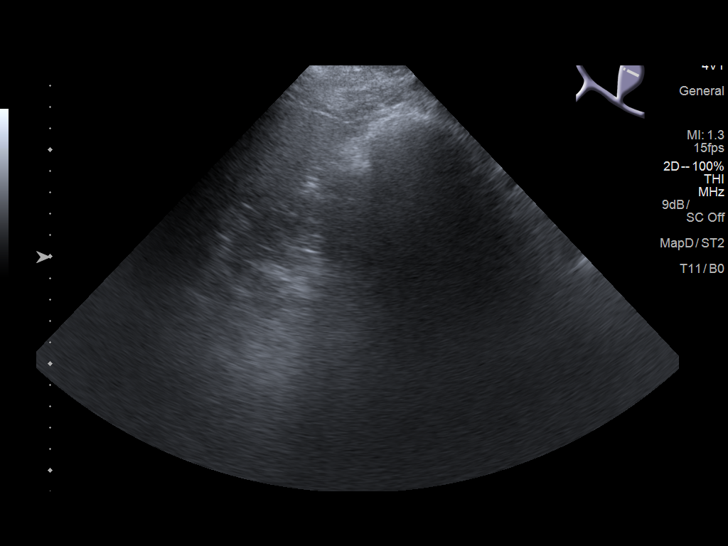
[im 8/47]
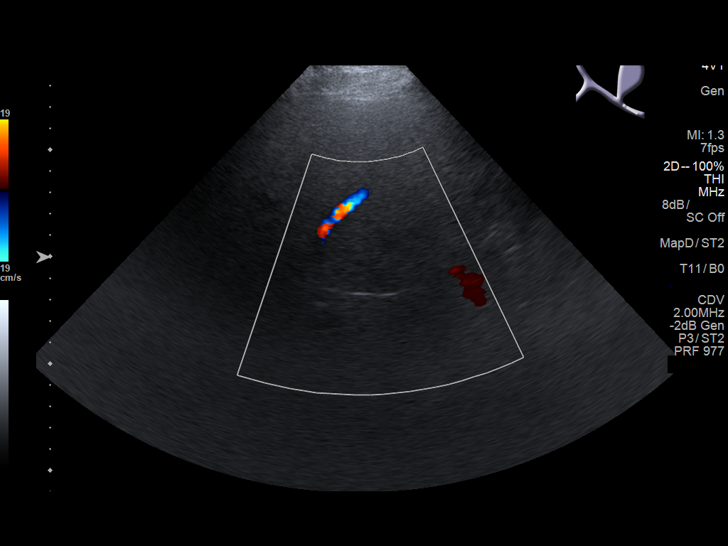
[im 12/47]
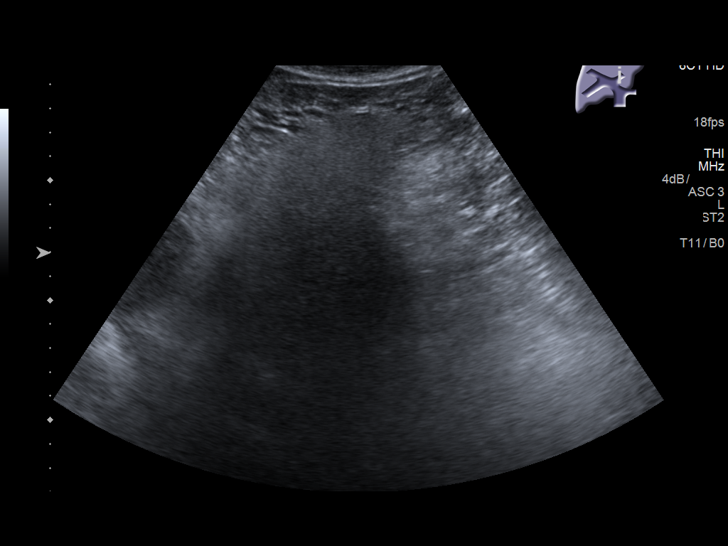
[im 16/47]
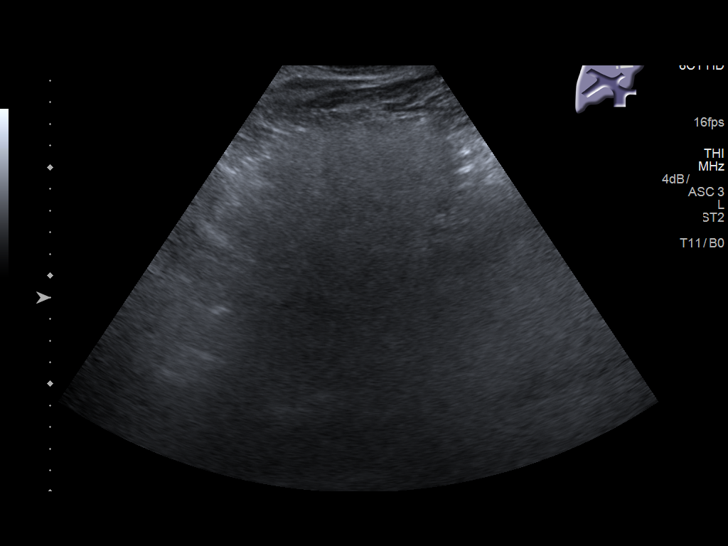
[im 18/47]
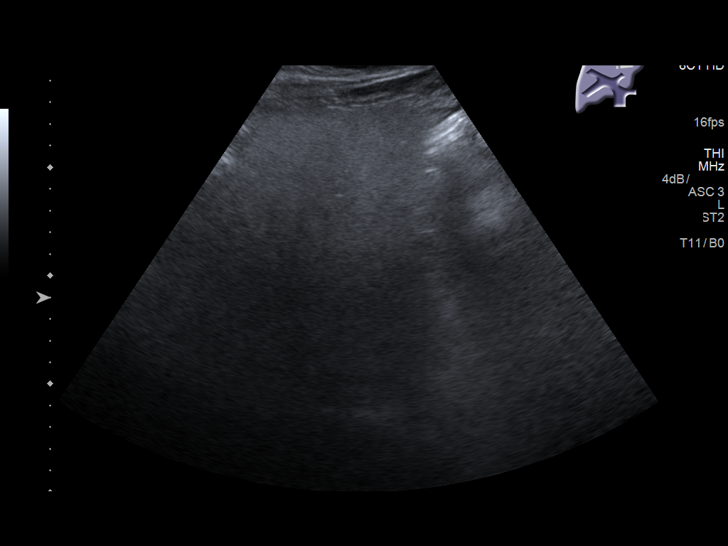
[im 22/47]
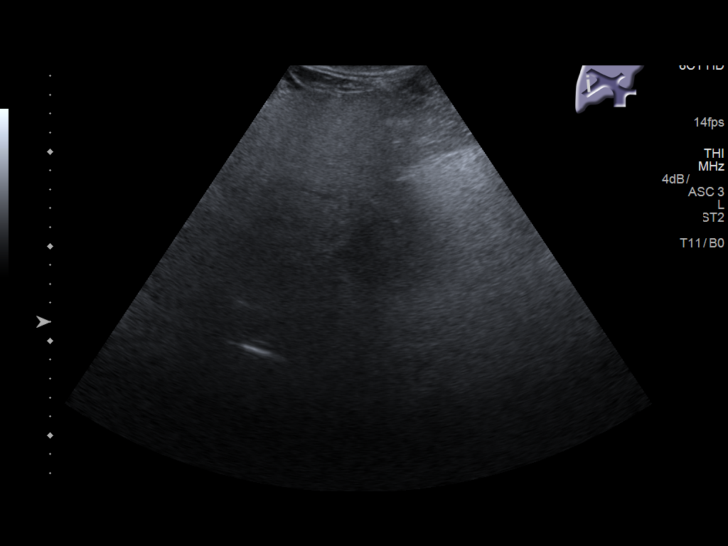
[im 25/47]
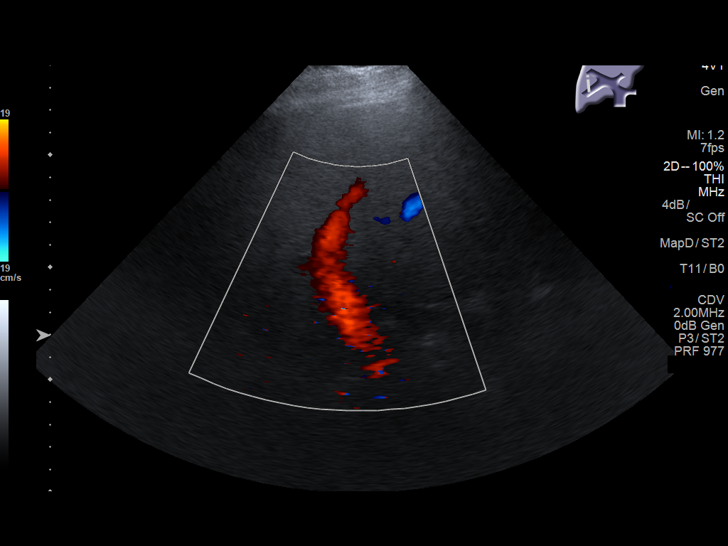
[im 29/47]
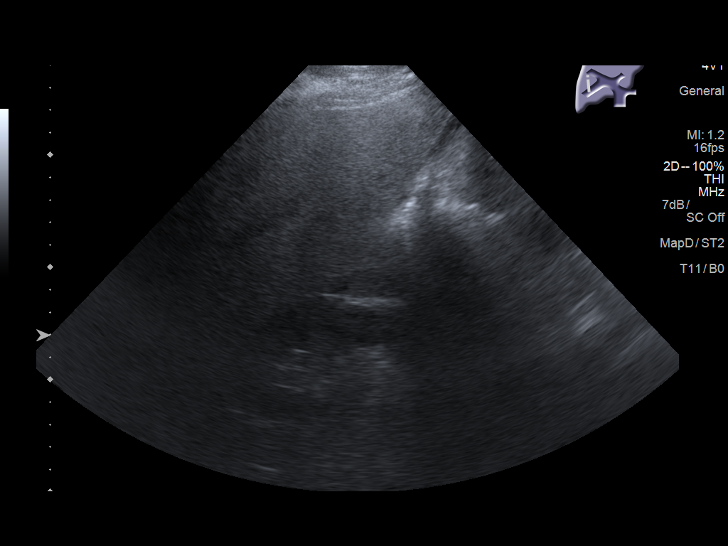
[im 31/47]
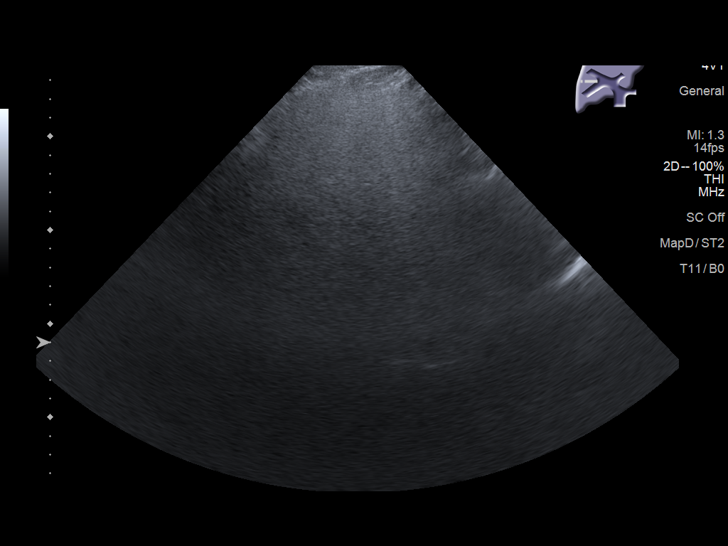
[im 35/47]
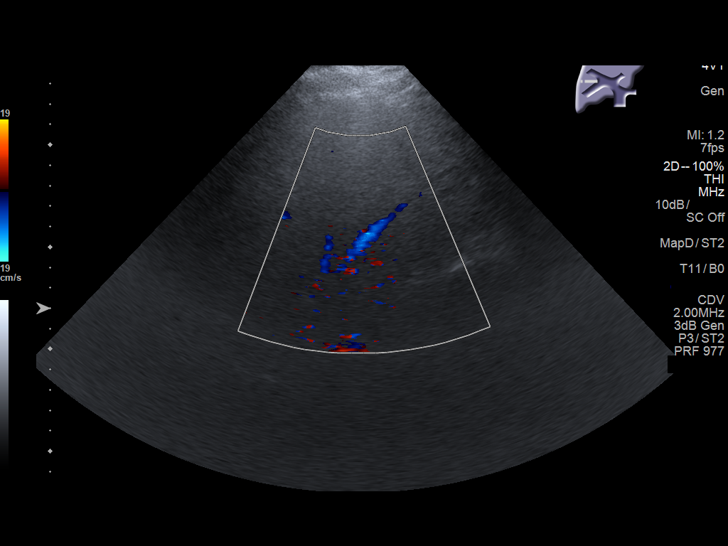
[im 39/47]
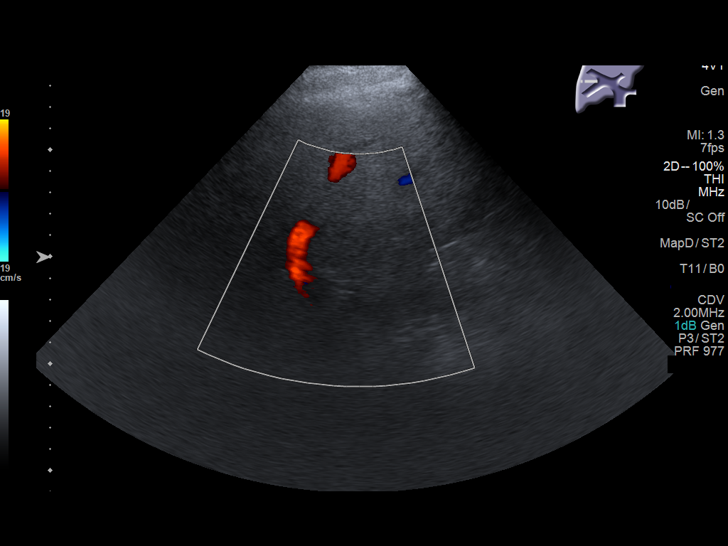
[im 43/47]
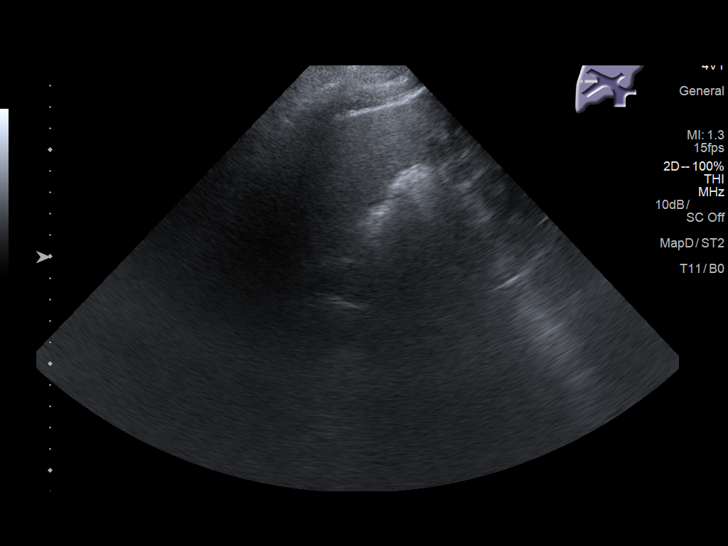
[im 47/47]
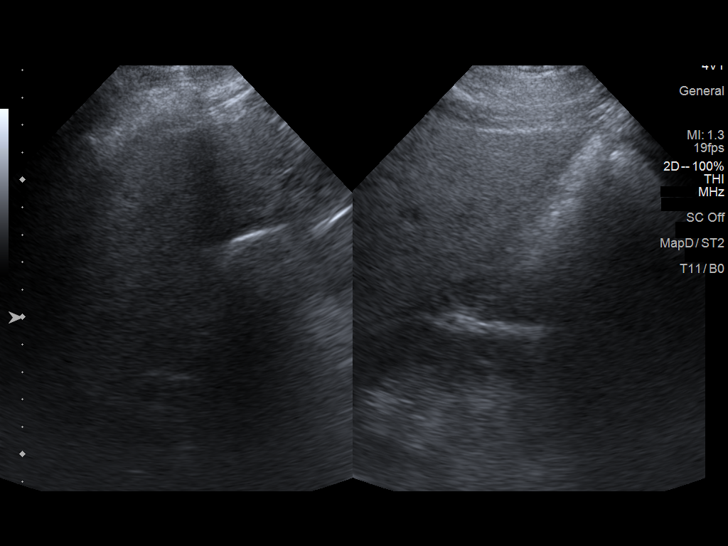

[14 of 25 positions shown; findings below may reference images not displayed]

FINDINGS: Limited because of body habitus.

Gallbladder:

Remote cholecystectomy

Common bile duct:

Diameter: 6 mm

Liver:

Increased hepatic echogenicity compatible with hepatic steatosis. No
biliary dilatation or obstruction. No focal hepatic abnormality.

Portal vein is patent on color Doppler imaging with normal direction
of blood flow towards the liver.
IMPRESSION: Remote cholecystectomy.

No biliary dilatation

Hepatic steatosis

## 2019-06-25 ENCOUNTER — Telehealth: Payer: Medicare Other

## 2019-06-26 ENCOUNTER — Ambulatory Visit (INDEPENDENT_AMBULATORY_CARE_PROVIDER_SITE_OTHER): Payer: Medicare Other | Admitting: Pharmacist

## 2019-06-26 DIAGNOSIS — I480 Paroxysmal atrial fibrillation: Secondary | ICD-10-CM | POA: Diagnosis not present

## 2019-06-26 DIAGNOSIS — E782 Mixed hyperlipidemia: Secondary | ICD-10-CM | POA: Diagnosis not present

## 2019-06-26 DIAGNOSIS — E1165 Type 2 diabetes mellitus with hyperglycemia: Secondary | ICD-10-CM | POA: Diagnosis not present

## 2019-06-26 DIAGNOSIS — G629 Polyneuropathy, unspecified: Secondary | ICD-10-CM

## 2019-06-26 DIAGNOSIS — IMO0002 Reserved for concepts with insufficient information to code with codable children: Secondary | ICD-10-CM

## 2019-06-26 DIAGNOSIS — E118 Type 2 diabetes mellitus with unspecified complications: Secondary | ICD-10-CM

## 2019-06-26 NOTE — Chronic Care Management (AMB) (Signed)
Chronic Care Management   Note  06/26/2019 Name: Jerry Parsons MRN: [id number] DOB: 12-17-1951  Subjective:   Does the patient  feel that his/her medications are working for him/her?  Yes- still needs some relief for leg pain  Has the patient been experiencing any side effects to the medications prescribed?  yes  Does the patient measure his/her own blood glucose at home?  yes   Does the patient measure his/her own blood pressure at home? yes   Does the patient have any problems obtaining medications due to transportation or finances?   no  Understanding of regimen: good Understanding of indications: good Potential of compliance: good  Objective: Lab Results  Component Value Date   CREATININE 1.38 (H) 11/03/2018   CREATININE 1.25 06/12/2018   CREATININE 1.05 04/27/2018    Lab Results  Component Value Date   HGBA1C 8.6 (A) 06/14/2019    Lipid Panel     Component Value Date/Time   CHOL 130 06/12/2018 0949   CHOL 93 04/03/2013 0209   TRIG 297 (H) 06/12/2018 0949   TRIG 163 04/03/2013 0209   HDL 29 (L) 06/12/2018 0949   HDL 25 (L) 04/03/2013 0209   CHOLHDL 4.5 06/12/2018 0949   CHOLHDL 6.6 09/09/2015 0501   VLDL UNABLE TO CALCULATE IF TRIGLYCERIDE OVER 400 mg/dL 09/09/2015 0501   VLDL 33 04/03/2013 0209   LDLCALC 42 06/12/2018 0949   LDLCALC 35 04/03/2013 0209    BP Readings from Last 3 Encounters:  06/14/19 129/72  02/05/19 (!) 128/48  11/02/18 (!) 144/62    Allergies  Allergen Reactions  . Carisoprodol Itching    Medications Reviewed Today    Reviewed by Cathi Roan, [facility] (Pharmacist) on 06/26/19 at 1633  Med List Status: <None>  Medication Order Taking? Sig Documenting Provider Last Dose Status Informant  amiodarone (PACERONE) 200 MG tablet [id number] Yes Take 1 tablet (200 mg total) by mouth daily. Minna Merritts, MD Taking Active   aspirin 81 MG tablet [id number] Yes Take 81 mg by mouth daily. [provider] Taking Active  Spouse/Significant Other           Med Note Kary Kos, Anais Denslow E   Tue Jun 26, 2019  4:19 PM) Double check  cholecalciferol (VITAMIN D3) 25 MCG (1000 UT) tablet [id number] Yes Take 1,000 Units by mouth daily. [provider] Taking Active   cyclobenzaprine (FLEXERIL) 10 MG tablet [id number] Yes TAKE 1 TABLET BY MOUTH 3 TIMES A DAY Jerrol Banana., MD Taking Active   Docusate Sodium (STOOL SOFTENER LAXATIVE PO) [id number] Yes Take by mouth. [provider] Taking Active   dutasteride (AVODART) 0.5 MG capsule [id number] Yes TAKE 1 CAPSULE DAILY Jerrol Banana., MD Taking Active   esomeprazole (NEXIUM) 40 MG capsule [id number] Yes TAKE 1 CAPSULE TWICE DAILY Jerrol Banana., MD Taking Active   famotidine (PEPCID) 20 MG tablet [id number] No Take 1 tablet (20 mg total) by mouth daily.  Patient not taking: Reported on 06/26/2019   Jerrol Banana., MD Not Taking Active   fexofenadine [facility]) 180 MG tablet [id number] Yes Take 180 mg by mouth daily.   [provider] Taking Active Spouse/Significant Other  furosemide (LASIX) 40 MG tablet [id number] Yes TAKE 1 TABLET (40 MG) BY MOUTH ONCE DAILY, YOU MAY TAKE 1 EXTRA TABLET (40 MG) AFTER LUNCH AS NEEDED FOR LEG SWELLING/ ABDOMINAL TIGHTNESS Jerrol Banana., MD Taking Active  Med Note Kary Kos, Markitta Ausburn E   Tue Jun 26, 2019  4:24 PM) Hasnt normally needed second dose  glucose blood test strip [id number] Yes Use with meter twice daily Jerrol Banana., MD Taking Active   hydrochlorothiazide (HYDRODIURIL) 12.5 MG tablet [id number] Yes TAKE 2 TABLETS (25 MG TOTAL) BY MOUTH AT BEDTIME. Jerrol Banana., MD Taking Active            Med Note [facility], Giulian Goldring E   Tue Jun 26, 2019  4:25 PM) Bedtime??  insulin aspart (NOVOLOG FLEXPEN) 100 UNIT/ML FlexPen [id number] Yes Inject 12 Units into the skin 3 (three) times daily with meals.  Patient taking differently: Inject 14 Units into the skin 3 (three)  times daily with meals.    Jerrol Banana., MD Taking Active   Insulin Pen Needle (PEN NEEDLES) 31G X 5 MM MISC [id number] Yes 1 each by Does not apply route daily. PATIENT NEEDS NOVA TWIST NEEDLES 5 MM  DX E11.9 Jerrol Banana., MD Taking Active Spouse/Significant Other  JANUVIA 100 MG tablet [id number] Yes TAKE 1 TABLET DAILY Mar Daring, PA-C Taking Active   LANTUS SOLOSTAR 100 UNIT/ML Solostar Pen [id number] Yes Inject 35 Units into the skin 2 (two) times daily.  Patient taking differently: Inject 38 Units into the skin 2 (two) times daily.    Jerrol Banana., MD Taking Active   liraglutide (VICTOZA) 18 MG/3ML [employer] [id number] Yes INJECT 0.3ML (=1.8MG )      SUBCUTANEOUSLY DAILY Jerrol Banana., MD Taking Active   magnesium oxide (MAG-OX) 400 MG tablet [id number] Yes TAKE 1 TABLET (400 MG TOTAL) BY MOUTH 2 (TWO) TIMES DAILY. Jerrol Banana., MD Taking Active            Med Note Kary Kos, Prapti Grussing E   Tue Jun 26, 2019  4:30 PM) ??  metoprolol tartrate (LOPRESSOR) 100 MG tablet [id number] Yes TAKE 1 TABLET (100 MG TOTAL) BY MOUTH 2 (TWO) TIMES DAILY. Jerrol Banana., MD Taking Active   Multiple Vitamins-Minerals (CENTRUM SILVER 50+MEN) TABS [id number] Yes Take by mouth daily. [provider] Taking Active   NOVOTWIST 32G X 5 MM MISC [id number] Yes USE 6 TIMES A DAY DX E11.9 Jerrol Banana., MD Taking Active         Discontinued 94/49/67 5916 (Duplicate)   Omega-3 Fatty Acids (FISH OIL) 1200 MG CAPS [id number] Yes Take 1,200 mg by mouth 2 (two) times daily.  [provider] Taking Active Spouse/Significant Other  oxyCODONE (OXYCONTIN) 40 mg 12 hr tablet [id number] Yes Take 1 tablet (40 mg total) by mouth every 8 (eight) hours as needed. Jerrol Banana., MD Taking Active   pregabalin (LYRICA) 200 MG capsule [id number] Yes Take 1 capsule (200 mg total) by mouth 3 (three) times daily. Jerrol Banana., MD Taking Active             Med Note Kary Kos, Maximillion Gill E   Tue Jun 26, 2019  4:31 PM)    sucralfate (CARAFATE) 1 g tablet [id number] Yes TAKE 1 TABLET (1 G TOTAL) BY MOUTH 4 (FOUR) TIMES DAILY - WITH MEALS AND AT BEDTIME. Trinna Post, PA-C Taking Active         Discontinued 06/26/19 [phone] (Patient Preference)   testosterone cypionate (DEPOTESTOSTERONE CYPIONATE) 200 MG/ML injection [id number] Yes Inject 200 mg into the muscle every 21 ( twenty-one) days.  [provider] Taking Active  Discontinued 44/97/53 0051 (Duplicate)   Med List Note Marylynn Pearson 11/25/16 1021): PATIENT USES BIPAP 14/9 AT NIGHT            Assessment:  #Leg pain  #T2DM: Patient taking Januvia, 1.8mg  Victoza, 38 units of Lantus, and 14 units of Novolog TIDAC and FBG still uncontrolled. In the past 1 week, range from 136 mg/dL to 220s mg/dL. A1c (06/14/19) is 8.6%, increased from 7.7% 7 months ago.  Patient states that his morning BG are usually better when he has an evening snack such as a Little Debbie oatmeal cookie  Goals Addressed            This Visit's Progress   . Diabetes (pt-stated)       Current Barriers:  [name] Knowledge Deficits related to basic Diabetes pathophysiology and self care/management . Knowledge Deficits related to medications used for management of diabetes  Clinical Pharmacist Goal(s):  Over the next 30 days, patient will demonstrate improved adherence to prescribed treatment plan for diabetes self care/management as evidenced by:  [name] Recorded morning blood sugar <200 mg/dL . adherence to ADA/ carb modified diet  Interventions:  . Provided education to patient about basic DM disease process  Patient Self Care Activities:  . Self administers insulin as prescribed . Self administers injectable DM medication (Victoza) as prescribed . Checks blood sugars as prescribed and utilize hyper and hypoglycemia protocol as needed . Adheres to prescribed ADA/carb modified  Initial goal  documentation     . Medicine optimization (pt-stated)         Plan: Recommendations discussed with provider - does patient need ASA 81mg     Recommendations discussed with patient - try OTC Vitamin B12 1000mg  daily- sometimes B12 deficiency presents as neuropathy  Follow up: Telephone follow up appointment with care management team member scheduled for: July 20 with PharmD for DM medication follow up  Ruben Reason, PharmD Clinical Pharmacist [place] [phone]

## 2019-06-26 NOTE — Patient Instructions (Signed)
Thank you for taking the time to speak with me today!    Please call a member of the CCM (Chronic Care Management) Team with any questions or case management needs:   Vanetta Mulders, BSN Nurse Care Coordinator  [phone]  Ruben Reason, PharmD  Clinical Pharmacist  [phone]  Elliot Gurney, LCSW Clinical Social Worker [phone]  Goals Addressed            This Visit's Progress   . Diabetes (pt-stated)       Current Barriers:  [name] Knowledge Deficits related to basic Diabetes pathophysiology and self care/management . Knowledge Deficits related to medications used for management of diabetes  Clinical Pharmacist Goal(s):  Over the next 30 days, patient will demonstrate improved adherence to prescribed treatment plan for diabetes self care/management as evidenced by:  [name] Recorded morning blood sugar <200 mg/dL . adherence to ADA/ carb modified diet  Interventions:  . Provided education to patient about basic DM disease process  Patient Self Care Activities:  . Self administers insulin as prescribed . Self administers injectable DM medication (Victoza) as prescribed . Checks blood sugars as prescribed and utilize hyper and hypoglycemia protocol as needed . Adheres to prescribed ADA/carb modified  Initial goal documentation

## 2019-06-27 ENCOUNTER — Other Ambulatory Visit: Payer: Self-pay | Admitting: Family Medicine

## 2019-07-13 ENCOUNTER — Other Ambulatory Visit: Payer: Self-pay | Admitting: Family Medicine

## 2019-07-16 ENCOUNTER — Telehealth: Payer: Self-pay

## 2019-07-16 ENCOUNTER — Ambulatory Visit: Payer: Self-pay | Admitting: Pharmacist

## 2019-07-16 DIAGNOSIS — E1165 Type 2 diabetes mellitus with hyperglycemia: Secondary | ICD-10-CM

## 2019-07-16 DIAGNOSIS — IMO0002 Reserved for concepts with insufficient information to code with codable children: Secondary | ICD-10-CM

## 2019-07-16 DIAGNOSIS — G629 Polyneuropathy, unspecified: Secondary | ICD-10-CM

## 2019-07-16 NOTE — Chronic Care Management (AMB) (Signed)
  Chronic Care Management   Note  07/16/2019 Name: Jerry Parsons MRN: [id number] DOB: July 05, 1951  68 y.o. year old male followed by CCM clinical pharmacist for medication optimization and DM medication management. Follow up outreach today to follow up on neuropathy.    Was unable to reach patient via telephone today and have left HIPAA compliant voicemail asking patient to return my call. (unsuccessful outreach #1).  Follow up plan: A HIPPA compliant phone message was left for the patient providing contact information and requesting a return call.  The care management team will reach out to the patient again over the next 5-7 days.   Ruben Reason, PharmD Clinical Pharmacist [place] [phone]

## 2019-07-17 ENCOUNTER — Encounter: Payer: Self-pay | Admitting: Sports Medicine

## 2019-07-17 ENCOUNTER — Ambulatory Visit (INDEPENDENT_AMBULATORY_CARE_PROVIDER_SITE_OTHER): Payer: Medicare Other | Admitting: Sports Medicine

## 2019-07-17 ENCOUNTER — Other Ambulatory Visit: Payer: Self-pay

## 2019-07-17 VITALS — Temp 97.0°F

## 2019-07-17 DIAGNOSIS — I739 Peripheral vascular disease, unspecified: Secondary | ICD-10-CM

## 2019-07-17 DIAGNOSIS — M79674 Pain in right toe(s): Secondary | ICD-10-CM

## 2019-07-17 DIAGNOSIS — M79675 Pain in left toe(s): Secondary | ICD-10-CM | POA: Diagnosis not present

## 2019-07-17 DIAGNOSIS — E1142 Type 2 diabetes mellitus with diabetic polyneuropathy: Secondary | ICD-10-CM

## 2019-07-17 DIAGNOSIS — B351 Tinea unguium: Secondary | ICD-10-CM

## 2019-07-17 IMAGING — CT CT L SPINE W/O CM
1 of 8 series · 5 of 14 positions shown, 7 images · non-contrast
Comparison: Multiple priors. Most recent MR 05/18/2014. Most recent
CT 04/02/2013.

CLINICAL DATA: Multiple spine surgeries.  Worsening low back pain.

EXAM:
CT LUMBAR SPINE WITHOUT CONTRAST
TECHNIQUE: Multidetector CT imaging of the lumbar spine was performed without
intravenous contrast administration. Multiplanar CT image
reconstructions were also generated.

[Series 5: l spine soft · axial · 0.48mm/px · z∈[-800,-608]mm · 5 of 144 slices shown, 7 images]
[im 24/144  soft-tissue]
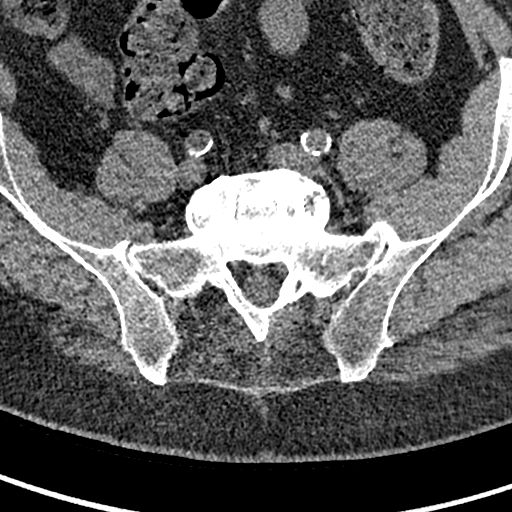
[im 24/144  bone]
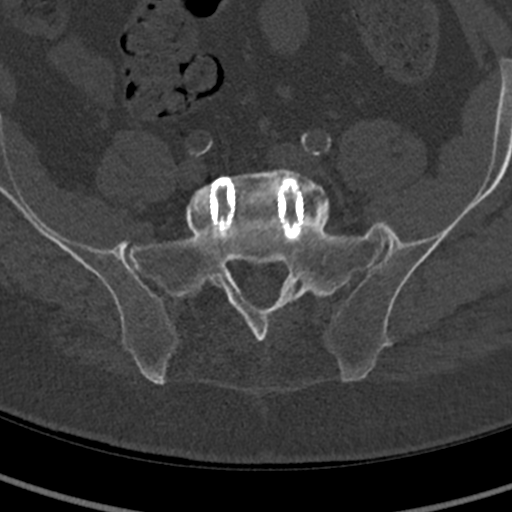
[im 48/144  bone]
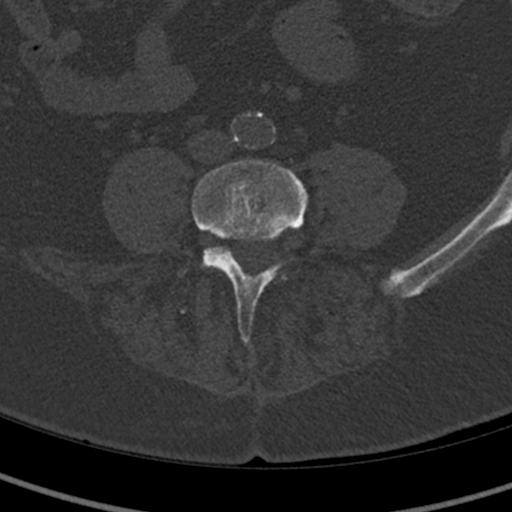
[im 72/144  bone]
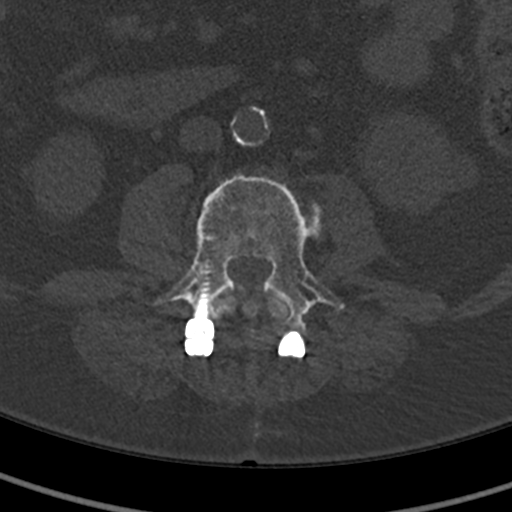
[im 96/144  bone]
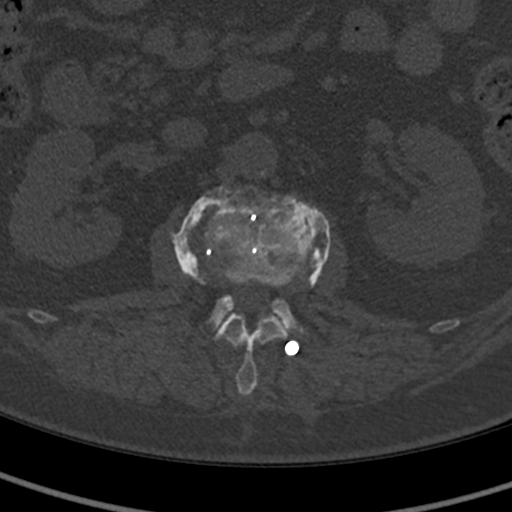
[im 120/144  soft-tissue]
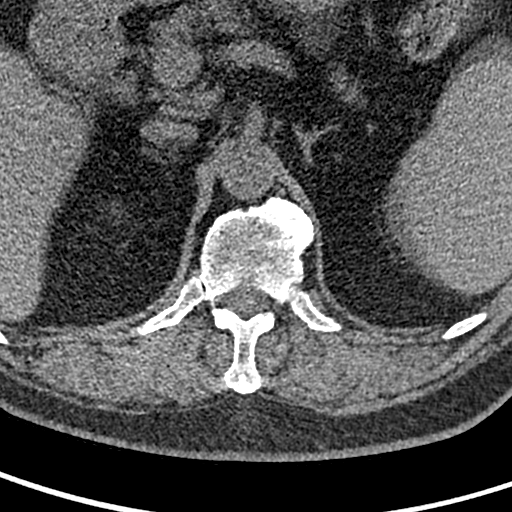
[im 120/144  bone]
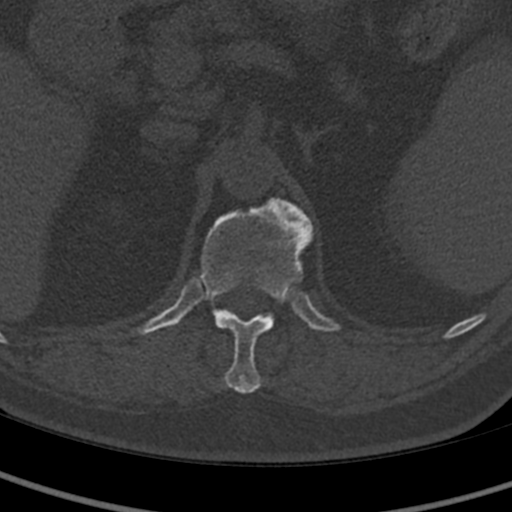

[5 of 14 positions shown; findings below may reference images not displayed]

FINDINGS: Segmentation: Standard.

Alignment: Straightening of the normal lumbar lordosis. No
significant subluxation.

Vertebrae: No worrisome osseous lesion.

Paraspinal and other soft tissues: Aortic atherosclerosis.
Cholecystectomy. No renal mass or hydronephrosis.

Disc levels:

L1-L2: Prior XLIF interbody cage. Cage is well centered. There is
interbody arthrodesis. Unilateral pedicle screw construct appears
unremarkable.

L2-L3: Prior XLIF interbody cage. Pedicle screw construct has been
augmented with lateral plate and screw fixation. Solid arthrodesis.
No residual impingement.

L3-L4: BILATERAL Ray cage fusion. Vacuum phenomenon within the
interspace, in conjunction with slight halo around the cages suggest
pseudarthrosis. The LEFT L4 screw may be slightly loose,
particularly at its tip. LEFT L3 screw lies within the L2-3
interspace. RIGHT L3 screw appears solid. LEFT L4 screw minimally
loose. Adequate posterior decompression. No impingement.

L4-L5:  Ray cage fusion.  Solid arthrodesis.  No impingement.

L5-S1:  Ray cage fusion.  Solid arthrodesis.  No impingement.

BILATERAL SI joints appear partially fused.  No erosions.

Compared with prior MR, there is good general agreement.
IMPRESSION: Status post multiple lumbar fusion surgeries. L1-2, L2-3, L4-5, and
L5-S1 levels all appear to have solid arthrodesis without residual
impingement.

BILATERAL Ray cage fusion at L3-4, with signs of pseudarthrosis
which include vacuum phenomenon, slight screw loosening, and slight
halo around the cages. See discussion above.

Given the anatomic alignment at L3-4, and lack of residual
impingement, significance is uncertain.

## 2019-07-17 NOTE — Progress Notes (Signed)
Subjective: Jerry Parsons is a 68 y.o. male patient with history of diabetes who presents to office today complaining of long,mildly painful nails while ambulating in shoes; unable to trim. Admits to some white bumps on skin that does not hurt. Patient states that the glucose reading this morning was 186mg /dl. Patient denies any new changes in medication or new problems.  PCP lateral next month month and last A1c was recorded at 8.6.  Patient Active Problem List   Diagnosis Date Noted  . Osteoarthritis of both knees 11/11/2018  . OSA on CPAP 10/14/2018  . MRSA bacteremia 05/19/2018  . Lumbar spondylosis 05/05/2018  . Spinal stenosis of lumbar region 05/05/2018  . Constipation, chronic 03/08/2018  . Pain in limb 03/03/2018  . Gastroesophageal reflux disease without esophagitis 01/04/2018  . OA (osteoarthritis) of knee 06/20/2017  . Sepsis due to pneumonia ([place]) 05/07/2017  . Neuropathy 10/13/2016  . Amblyopia 12/30/2015  . 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 NS (nuclear sclerosis) 12/30/2015  . Pseudoaphakia 12/30/2015  . Dehydration 09/08/2015  . Aspiration pneumonia ([place]) 07/04/2015  . Paroxysmal atrial fibrillation ([place]) 07/04/2015  . Arthritis of knee, degenerative 05/28/2015  . Chronic back pain 09/17/2014  . Leg edema 05/07/2014  . Chronic diastolic CHF (congestive heart failure) ([place]) 05/07/2014  . Campylobacter diarrhea 04/25/2013  . Atrial flutter ([place]) 01/23/2013  . Obesity 05/19/2012  . Hyperlipidemia 11/11/2011  . Diastolic dysfunction 40/98/1191  . SOB (shortness of breath) 04/12/2011  . Diabetes mellitus type 2, uncontrolled, with complications ([place]) 47/82/9562  . HYPERTENSION, BENIGN 03/15/2011  . DVT 03/15/2011  . TACHYCARDIA 03/15/2011   Current Outpatient Medications on File Prior to Visit  Medication Sig Dispense Refill  . amiodarone (PACERONE) 200 MG tablet Take 1 tablet (200 mg total) by mouth daily. 90 tablet 0  . aspirin 81 MG tablet Take 81 mg by mouth  daily.    . cholecalciferol (VITAMIN D3) 25 MCG (1000 UT) tablet Take 1,000 Units by mouth daily.    . cyclobenzaprine (FLEXERIL) 10 MG tablet TAKE 1 TABLET BY MOUTH 3 TIMES A DAY 90 tablet 12  . Docusate Sodium (STOOL SOFTENER LAXATIVE PO) Take by mouth.    . dutasteride (AVODART) 0.5 MG capsule TAKE 1 CAPSULE DAILY 90 capsule 3  . esomeprazole (NEXIUM) 40 MG capsule TAKE 1 CAPSULE TWICE DAILY 90 capsule 3  . famotidine (PEPCID) 20 MG tablet Take 1 tablet (20 mg total) by mouth daily. 30 tablet 12  . fexofenadine (ALLEGRA) 180 MG tablet Take 180 mg by mouth daily.      . furosemide (LASIX) 40 MG tablet TAKE 1 TABLET (40 MG) BY MOUTH ONCE DAILY, YOU MAY TAKE 1 EXTRA TABLET (40 MG) AFTER LUNCH AS NEEDED FOR LEG SWELLING/ ABDOMINAL TIGHTNESS 180 tablet 1  . gabapentin (NEURONTIN) 100 MG capsule Take 100 mg by mouth 2 (two) times daily.    [name] glucose blood test strip Use with meter twice daily 100 each 3  . hydrochlorothiazide (HYDRODIURIL) 12.5 MG tablet TAKE 2 TABLETS (25 MG TOTAL) BY MOUTH AT BEDTIME. 90 tablet 3  . insulin aspart (NOVOLOG FLEXPEN) 100 UNIT/ML FlexPen Inject 12 Units into the skin 3 (three) times daily with meals. (Patient taking differently: Inject 14 Units into the skin 3 (three) times daily with meals. ) 45 mL 3  . Insulin Pen Needle (PEN NEEDLES) 31G X 5 MM MISC 1 each by Does not apply route daily. PATIENT NEEDS NOVA TWIST NEEDLES 5 MM  DX E11.9 200 each 3  .  JANUVIA 100 MG tablet TAKE 1 TABLET DAILY 90 tablet 3  . LANTUS SOLOSTAR 100 UNIT/ML Solostar Pen Inject 35 Units into the skin 2 (two) times daily. (Patient taking differently: Inject 38 Units into the skin 2 (two) times daily. ) 45 mL 3  . liraglutide (VICTOZA) 18 MG/3ML SOPN INJECT 0.3ML (=1.8MG )      SUBCUTANEOUSLY DAILY 27 mL 3  . magnesium oxide (MAG-OX) 400 MG tablet TAKE 1 TABLET (400 MG TOTAL) BY MOUTH 2 (TWO) TIMES DAILY. 180 tablet 3  . metoprolol tartrate (LOPRESSOR) 100 MG tablet TAKE 1 TABLET (100 MG TOTAL)  BY MOUTH 2 (TWO) TIMES DAILY. 180 tablet 3  . Multiple Vitamins-Minerals (CENTRUM SILVER 50+MEN) TABS Take by mouth daily.    [name] NOVOTWIST 32G X 5 MM MISC USE 6 TIMES A DAY DX E11.9 100 each 9  . Omega-3 Fatty Acids (FISH OIL) 1200 MG CAPS Take 1,200 mg by mouth 2 (two) times daily.     [name] oxyCODONE (OXYCONTIN) 40 mg 12 hr tablet Take 1 tablet (40 mg total) by mouth every 8 (eight) hours as needed. 270 tablet 0  . pregabalin (LYRICA) 200 MG capsule Take 1 capsule (200 mg total) by mouth 3 (three) times daily. 270 capsule 3  . sucralfate (CARAFATE) 1 g tablet TAKE 1 TABLET (1 G TOTAL) BY MOUTH 4 (FOUR) TIMES DAILY - WITH MEALS AND AT BEDTIME. 360 tablet 0  . testosterone cypionate (DEPOTESTOSTERONE CYPIONATE) 200 MG/ML injection Inject 200 mg into the muscle every 21 ( twenty-one) days.      No current facility-administered medications on file prior to visit.    Allergies  Allergen Reactions  . Carisoprodol Itching    Recent Results (from the past 2160 hour(s))  POCT glycosylated hemoglobin (Hb A1C)     Status: Abnormal   Collection Time: 06/14/19  4:21 PM  Result Value Ref Range   Hemoglobin A1C 8.6 (A) 4.0 - 5.6 %   HbA1c POC (<> result, manual entry)     HbA1c, POC (prediabetic range)     HbA1c, POC (controlled diabetic range)      Objective: General: Patient is awake, alert, and oriented x 3 and in no acute distress.  Integument: Skin is warm, dry and supple bilateral. Nails are tender, long, thickened and dystrophic with subungual debris, consistent with onychomycosis, 1-5 bilateral.Minimal Callus to right and left 1st toe with dry heme. No signs of infection. No open lesions or preulcerative lesions present bilateral. Remaining integument unremarkable.  Vasculature:  Dorsalis Pedis pulse 1/4 bilateral. Posterior Tibial pulse  0/4 bilateral.  Capillary fill time <3 sec 1-5 bilateral. No hair growth to the level of the digits. Temperature gradient within normal limits. No  varicosities present bilateral. 1+ pitting edema present bilateral. +Stucco dermatitis due to PVD without infection.   Neurology: The patient has absent sensation measured with a 5.07/10g Semmes Weinstein Monofilament at all pedal sites bilateral . Vibratory sensation absent bilateral with tuning fork. No Babinski sign present bilateral.   Musculoskeletal: Asymptomatic hammertoes pedal deformities noted bilateral. Muscular strength 5/5 in all lower extremity muscular groups bilateral without pain on range of motion . No tenderness with calf compression bilateral.  Assessment and Plan: Problem List Items Addressed This Visit    None    Visit Diagnoses    Pain due to onychomycosis of toenails of both feet    -  Primary   Diabetic polyneuropathy associated with type 2 diabetes mellitus ([place])       Relevant  Medications   gabapentin (NEURONTIN) 100 MG capsule   PAD (peripheral artery disease) ([place])          -Examined patient. -Discussed and educated patient on diabetic foot care, especially with  regards to the vascular, neurological and musculoskeletal systems.  -Stressed the importance of good glycemic control and the detriment of not  controlling glucose levels in relation to the foot. -Mechanically debrided callus at right and left 1st toes at no charge with no opening underneath like before and debrided all nails 1-5 bilateral using sterile nail nipper and filed with dremel without incident  -Answered all patient questions -Patient to return  in 2.5 to 3 months for at risk foot care -Patient advised to call the office if any problems or questions arise in the meantime.  Landis Martins, DPM

## 2019-07-22 NOTE — Progress Notes (Signed)
Cardiology Office Note  Date:  07/23/2019   ID:  Courtney, Fenlon [birth year], MRN [id number]  PCP:  Jerrol Banana., MD   Chief Complaint  Patient presents with  . Other    12 month follow up. Patient denies chest pain and SOB. Meds reviewed verbally with patient.     HPI:  Mr. Kumpf is a very pleasant 68 year old gentleman, multiple back surgeries,  spinal stenosis , s/p arthroscopic knee surgery, total knee replacement left obesity,  poorly controlled diabetes ,  DVT, PE in March 2011 previously on warfarin,  30 year smoking history,   hypertension,  depression,  renal disease,  obstructive sleep apnea on CPAP,  chronic sweating at nighttime which he attributes to the Vicodin. Remote atrial flutter, fibrillation, last episode in 2015. Not on anticoagulation   presenting for routine followup of his hypertension and cardiac risk factors. .  Main complaint today is severe neuropathy in his feet Nighttime is the worst time Sleeping is difficult Pain pills don't help Unclear if he is taking Neurontin Also on Lyrica Tried CBD Poor sleep, 4 hours at the most before he has to get up move around up and down all night trying to get his tingling foot discomfort to go away  9 back surgeries, previously thought about  spinal cord stimulator  Chronic ankle leg swelling not wearing comperssions  Chronic back pain, gait problem Sees Dr. Carloyn Manner  No regular exercise program no tachycardia or fluttering heart rhythm  Lab work reviewed with him HBA1C 8.0  EKG personally reviewed by myself on todays visit  shows normal sinus rhythm withRate 86 bpm no significant ST or T-wave changes  other past medical history reviewed Admission 5/18 for sepsis, salmonella diarrhea, per the discharge summary Hospital records reviewed with the patient in detail From eggs, per the patient He did not receive antibiotics  pneumonia while at the [facility] January 2017 He was in ICU for 3  days, long recovery Lost more than 30 pounds,  Lipid panel done while he was very ill early 2017, total cholesterol at that time 89  Denies any atrial fibrillation through his hospital course, reports he was changed to amiodarone IV infusion and then back to pill when he was tolerating oral medications  Previous event where he had buckling of his knee, fall, contusion, TIA-type symptoms that took him to the hospital. He was kept overnight and most of his workup was relatively unrevealing including MRI/MRA, carotid ultrasound, echocardiogram.  History of atrial flutter, status post cardioversion on 03/14/2013.   2 admissions to the hospital for gastroenteritis/ campylobacter infection. Required a long course of antibiotics. Possible  infection from tainted chicken.  Had back surgery, severe pain, went into atrial fib/flutter,  09/2014, had cardioversion   Previously on prednisone for his "legs". Sugars ran high. Previously developed leg swelling. Wife went away and he sat in a recliner with his feet down for close to 2 weeks watching TV, drinking fluids.  Creatinine at the end of April was 1.5, BUN 27, possibly from overdiuresis  cardiac workup in the 1990s:  cardiac catheterization with "minimal blockage". Previous DVT , he did have shortness of breath.  echocardiogram March 2011 shows normal LV and RV systolic function, mild LVH, diastolic dysfunction, mildly dilated left atrium.   PMH:   has a past medical history of Arrhythmia, Arthritis, Blind right eye, BPH (benign prostatic hyperplasia), Diabetes mellitus, DVT (deep venous thrombosis) ([place]) (02/2010), Dyspnea, Dysrhythmia, 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,  GERD (gastroesophageal reflux disease), Hypercholesteremia, Hypertension, Kidney failure, Neuromuscular disorder ([place]), Neuropathy, Pneumonia, Pulmonary embolus ([place]) (2011), Seasonal allergies, Seizures ([place]), Sleep apnea, Stiff neck, Stroke ([place]), TIA (transient  ischemic attack), and Wears dentures.  PSH:    Past Surgical History:  Procedure Laterality Date  . APPENDECTOMY    . BACK SURGERY     x 8; upper x 3 & lower x 5  . CARDIOVERSION  03/14/13, 10/16   2014 - [place], 2016 - [place]  . CATARACT EXTRACTION W/PHACO Left 10/29/2015   Procedure: CATARACT EXTRACTION PHACO AND INTRAOCULAR LENS PLACEMENT (IOC);  Surgeon: Leandrew Koyanagi, MD;  Location: [facility];  Service: Ophthalmology;  Laterality: Left;  DIABETIC - insulin and oral medsSleep apnea - no machine  . CHOLECYSTECTOMY    . COLONOSCOPY WITH PROPOFOL N/A 01/16/2018   Procedure: COLONOSCOPY WITH PROPOFOL;  Surgeon: Manya Silvas, MD;  Location[facility] ENDOSCOPY;  Service: Endoscopy;  Laterality: N/A;  . ESOPHAGOGASTRODUODENOSCOPY (EGD) WITH PROPOFOL N/A 01/16/2018   Procedure: ESOPHAGOGASTRODUODENOSCOPY (EGD) WITH PROPOFOL;  Surgeon: Manya Silvas, MD;  Location: [facility] ENDOSCOPY;  Service: Endoscopy;  Laterality: N/A;  . EYE SURGERY    . GALLBLADDER SURGERY  2002  . JOINT REPLACEMENT Right 2018  . KNEE ARTHROSCOPY     left   . ROTATOR CUFF REPAIR  2001   left   . TEE WITHOUT CARDIOVERSION N/A 04/26/2018   Procedure: TRANSESOPHAGEAL ECHOCARDIOGRAM (TEE);  Surgeon: Nelva Bush, MD;  Location: [facility] ORS;  Service: Cardiovascular;  Laterality: N/A;  . TONSILLECTOMY    . TOTAL KNEE ARTHROPLASTY Right 06/20/2017   Procedure: RIGHT TOTAL KNEE ARTHROPLASTY;  Surgeon: Gaynelle Arabian, MD;  Location: [facility] ORS;  Service: Orthopedics;  Laterality: Right;    Current Outpatient Medications  Medication Sig Dispense Refill  . amiodarone (PACERONE) 200 MG tablet Take 1 tablet (200 mg total) by mouth daily. 90 tablet 0  . aspirin 81 MG tablet Take 81 mg by mouth daily.    . cholecalciferol (VITAMIN D3) 25 MCG (1000 UT) tablet Take 1,000 Units by mouth daily.    . cyclobenzaprine (FLEXERIL) 10 MG tablet TAKE 1 TABLET BY MOUTH 3 TIMES A DAY 90 tablet 12  . Docusate Sodium (STOOL SOFTENER  LAXATIVE PO) Take by mouth.    . dutasteride (AVODART) 0.5 MG capsule TAKE 1 CAPSULE DAILY 90 capsule 3  . esomeprazole (NEXIUM) 40 MG capsule TAKE 1 CAPSULE TWICE DAILY 90 capsule 3  . famotidine (PEPCID) 20 MG tablet Take 1 tablet (20 mg total) by mouth daily. 30 tablet 12  . fexofenadine (ALLEGRA) 180 MG tablet Take 180 mg by mouth daily.      . furosemide (LASIX) 40 MG tablet TAKE 1 TABLET (40 MG) BY MOUTH ONCE DAILY, YOU MAY TAKE 1 EXTRA TABLET (40 MG) AFTER LUNCH AS NEEDED FOR LEG SWELLING/ ABDOMINAL TIGHTNESS 180 tablet 1  . glucose blood test strip Use with meter twice daily 100 each 3  . hydrochlorothiazide (HYDRODIURIL) 12.5 MG tablet TAKE 2 TABLETS (25 MG TOTAL) BY MOUTH AT BEDTIME. 90 tablet 3  . insulin aspart (NOVOLOG FLEXPEN) 100 UNIT/ML FlexPen Inject 12 Units into the skin 3 (three) times daily with meals. (Patient taking differently: Inject 14 Units into the skin 3 (three) times daily with meals. ) 45 mL 3  . Insulin Pen Needle (PEN NEEDLES) 31G X 5 MM MISC 1 each by Does not apply route daily. PATIENT NEEDS NOVA TWIST NEEDLES 5 MM  DX E11.9 200 each 3  . JANUVIA  100 MG tablet TAKE 1 TABLET DAILY 90 tablet 3  . LANTUS SOLOSTAR 100 UNIT/ML Solostar Pen Inject 35 Units into the skin 2 (two) times daily. (Patient taking differently: Inject 38 Units into the skin 2 (two) times daily. ) 45 mL 3  . liraglutide (VICTOZA) 18 MG/3ML SOPN INJECT 0.3ML (=1.8MG )      SUBCUTANEOUSLY DAILY 27 mL 3  . magnesium oxide (MAG-OX) 400 MG tablet TAKE 1 TABLET (400 MG TOTAL) BY MOUTH 2 (TWO) TIMES DAILY. 180 tablet 3  . metoprolol tartrate (LOPRESSOR) 100 MG tablet TAKE 1 TABLET (100 MG TOTAL) BY MOUTH 2 (TWO) TIMES DAILY. 180 tablet 3  . Multiple Vitamins-Minerals (CENTRUM SILVER 50+MEN) TABS Take by mouth daily.    [name] NOVOTWIST 32G X 5 MM MISC USE 6 TIMES A DAY DX E11.9 100 each 9  . Omega-3 Fatty Acids (FISH OIL) 1200 MG CAPS Take 1,200 mg by mouth 2 (two) times daily.     [name] oxyCODONE (OXYCONTIN) 40  mg 12 hr tablet Take 1 tablet (40 mg total) by mouth every 8 (eight) hours as needed. 270 tablet 0  . pregabalin (LYRICA) 200 MG capsule Take 1 capsule (200 mg total) by mouth 3 (three) times daily. 270 capsule 3  . sucralfate (CARAFATE) 1 g tablet TAKE 1 TABLET (1 G TOTAL) BY MOUTH 4 (FOUR) TIMES DAILY - WITH MEALS AND AT BEDTIME. 360 tablet 0  . testosterone cypionate (DEPOTESTOSTERONE CYPIONATE) 200 MG/ML injection Inject 200 mg into the muscle every 21 ( twenty-one) days.      No current facility-administered medications for this visit.      Allergies:   Carisoprodol   Social History:  The patient  reports that he quit smoking about 29 years ago. His smoking use included cigarettes. He has a 40.00 pack-year smoking history. He has never used smokeless tobacco. He reports that he does not drink alcohol or use drugs.   Family History:   family history includes Heart attack in his brother.    Review of Systems: Review of Systems  Constitutional: Negative.   Respiratory: Negative.   Cardiovascular: Positive for leg swelling.  Gastrointestinal: Negative.   Musculoskeletal: Positive for back pain and joint pain.       Tingling foot pain  Neurological: Negative.   Psychiatric/Behavioral: Negative.   All other systems reviewed and are negative.   PHYSICAL EXAM: VS:  BP 140/60 (BP Location: Left Arm, Patient Position: Sitting, Cuff Size: Normal)   Pulse 86   Ht 5\' 10"  (1.778 m)   Wt 275 lb 4 oz (124.9 kg)   BMI 39.49 kg/m  , BMI Body mass index is 39.49 kg/m. Constitutional:  oriented to person, place, and time. No distress.  HENT:  Head: Grossly normal Eyes:  no discharge. No scleral icterus.  Neck: No JVD, no carotid bruits  Cardiovascular: Regular rate and rhythm, no murmurs appreciated Pulmonary/Chest: Clear to auscultation bilaterally, no wheezes or rails Abdominal: Soft.  no distension.  no tenderness.  Musculoskeletal: Normal range of motion Neurological:  normal  muscle tone. Coordination normal. No atrophy Skin: Skin warm and dry Psychiatric: normal affect, pleasant   Recent Labs: 11/03/2018: BUN 22; Creatinine, Ser 1.38; Potassium 4.6; Sodium 141    Lipid Panel Lab Results  Component Value Date   CHOL 130 06/12/2018   HDL 29 (L) 06/12/2018   LDLCALC 42 06/12/2018   TRIG 297 (H) 06/12/2018      Wt Readings from Last 3 Encounters:  07/23/19 275 lb 4  oz (124.9 kg)  06/14/19 269 lb 3.2 oz (122.1 kg)  02/05/19 286 lb (129.7 kg)     ASSESSMENT AND PLAN:  Atrial flutter, unspecified type ([place]) - Plan: EKG 12-Lead No recent episodes of arrhythmia We did discuss stopping the amiodarone He prefers to stay on the medication Does not want anticoagulation Again discussed with him in detail, no medication changes made  HYPERTENSION, BENIGN - Plan: EKG 12-Lead Blood pressure stable, no medication changes made  Paroxysmal atrial fibrillation ([place]) - Plan: EKG 12-Lead No recent arrhythmia  On amiodarone Previously declining anticoagulation Denies palpitations  Hyperlipidemia Unable to exercise Lipid panel last year very reasonable  Chronic diastolic CHF (congestive heart failure) ([place]) He is taking Lasix 40 mg daily Leg swelling likely venous reflux, recommended compression hose and weight loss  Uncontrolled type 2 diabetes mellitus with complication, with long-term current unable to exercise, weight running high A1c of 8  Primary osteoarthritis of both knees Limiting his ability to ambulate and exercise  Neuropathy Significant time spent discussing strategies for his neuropathy at nighttime Recommend compression hose in the daytime to limit leg swelling which could exacerbate his symptoms Recommended he discuss with Dr. [name] whether he should take higher dose of Neurontin before bed   Total encounter time more than 25 minutes  Greater than 50% was spent in counseling and coordination of care with the  patient   Disposition:   F/U  12 months   Orders Placed This Encounter  Procedures  . EKG 12-Lead     Signed, Esmond Plants, M.D., Ph.D. 07/23/2019  [place], [place]

## 2019-07-23 ENCOUNTER — Other Ambulatory Visit: Payer: Self-pay

## 2019-07-23 ENCOUNTER — Encounter: Payer: Self-pay | Admitting: Cardiovascular Disease

## 2019-07-23 ENCOUNTER — Ambulatory Visit: Payer: Medicare Other | Admitting: Pharmacist

## 2019-07-23 ENCOUNTER — Ambulatory Visit (INDEPENDENT_AMBULATORY_CARE_PROVIDER_SITE_OTHER): Payer: Medicare Other | Admitting: Cardiovascular Disease

## 2019-07-23 VITALS — BP 140/60 | HR 86 | Ht 70.0 in | Wt 275.2 lb

## 2019-07-23 DIAGNOSIS — E118 Type 2 diabetes mellitus with unspecified complications: Secondary | ICD-10-CM | POA: Diagnosis not present

## 2019-07-23 DIAGNOSIS — E1165 Type 2 diabetes mellitus with hyperglycemia: Secondary | ICD-10-CM

## 2019-07-23 DIAGNOSIS — IMO0002 Reserved for concepts with insufficient information to code with codable children: Secondary | ICD-10-CM

## 2019-07-23 DIAGNOSIS — E782 Mixed hyperlipidemia: Secondary | ICD-10-CM | POA: Diagnosis not present

## 2019-07-23 DIAGNOSIS — Z794 Long term (current) use of insulin: Secondary | ICD-10-CM

## 2019-07-23 DIAGNOSIS — I5032 Chronic diastolic (congestive) heart failure: Secondary | ICD-10-CM | POA: Diagnosis not present

## 2019-07-23 DIAGNOSIS — R0602 Shortness of breath: Secondary | ICD-10-CM | POA: Diagnosis not present

## 2019-07-23 DIAGNOSIS — I483 Typical atrial flutter: Secondary | ICD-10-CM

## 2019-07-23 DIAGNOSIS — G629 Polyneuropathy, unspecified: Secondary | ICD-10-CM

## 2019-07-23 NOTE — Chronic Care Management (AMB) (Signed)
Chronic Care Management   Note  07/23/2019 Name: Jerry Parsons MRN: [id number] DOB: 1951/05/07  Subjective:   Does the patient  feel that his/her medications are working for him/her?  Yes- still needs some relief for leg pain  Has the patient been experiencing any side effects to the medications prescribed?  yes  Does the patient measure his/her own blood glucose at home?  yes   Does the patient measure his/her own blood pressure at home? yes   Does the patient have any problems obtaining medications due to transportation or finances?   no  Understanding of regimen: good Understanding of indications: good Potential of compliance: good  Objective: Lab Results  Component Value Date   CREATININE 1.38 (H) 11/03/2018   CREATININE 1.25 06/12/2018   CREATININE 1.05 04/27/2018    Lab Results  Component Value Date   HGBA1C 8.6 (A) 06/14/2019    Lipid Panel     Component Value Date/Time   CHOL 130 06/12/2018 0949   CHOL 93 04/03/2013 0209   TRIG 297 (H) 06/12/2018 0949   TRIG 163 04/03/2013 0209   HDL 29 (L) 06/12/2018 0949   HDL 25 (L) 04/03/2013 0209   CHOLHDL 4.5 06/12/2018 0949   CHOLHDL 6.6 09/09/2015 0501   VLDL UNABLE TO CALCULATE IF TRIGLYCERIDE OVER 400 mg/dL 09/09/2015 0501   VLDL 33 04/03/2013 0209   LDLCALC 42 06/12/2018 0949   LDLCALC 35 04/03/2013 0209    BP Readings from Last 3 Encounters:  07/23/19 140/60  06/14/19 129/72  02/05/19 (!) 128/48    Allergies  Allergen Reactions  . Carisoprodol Itching    Medications Reviewed Today    Reviewed by Janan Ridge, CMA (Certified Medical Assistant) on 07/23/19 at 1149  Med List Status: <None>  Medication Order Taking? Sig Documenting Provider Last Dose Status Informant  amiodarone (PACERONE) 200 MG tablet [id number] Yes Take 1 tablet (200 mg total) by mouth daily. Minna Merritts, MD Taking Active   aspirin 81 MG tablet [id number] Yes Take 81 mg by mouth daily. [provider] Taking  Active Spouse/Significant Other           Med Note Kary Kos, Sarye Kath E   Tue Jun 26, 2019  4:19 PM) Double check  cholecalciferol (VITAMIN D3) 25 MCG (1000 UT) tablet [id number] Yes Take 1,000 Units by mouth daily. [provider] Taking Active   cyclobenzaprine (FLEXERIL) 10 MG tablet [id number] Yes TAKE 1 TABLET BY MOUTH 3 TIMES A DAY Jerrol Banana., MD Taking Active   Docusate Sodium (STOOL SOFTENER LAXATIVE PO) [id number] Yes Take by mouth. [provider] Taking Active   dutasteride (AVODART) 0.5 MG capsule [id number] Yes TAKE 1 CAPSULE DAILY Jerrol Banana., MD Taking Active   esomeprazole (NEXIUM) 40 MG capsule [id number] Yes TAKE 1 CAPSULE TWICE DAILY Jerrol Banana., MD Taking Active   famotidine (PEPCID) 20 MG tablet [id number] Yes Take 1 tablet (20 mg total) by mouth daily. Jerrol Banana., MD Taking Active   fexofenadine [facility]) 180 MG tablet [id number] Yes Take 180 mg by mouth daily.   [provider] Taking Active Spouse/Significant Other  furosemide (LASIX) 40 MG tablet [id number] Yes TAKE 1 TABLET (40 MG) BY MOUTH ONCE DAILY, YOU MAY TAKE 1 EXTRA TABLET (40 MG) AFTER LUNCH AS NEEDED FOR LEG SWELLING/ ABDOMINAL TIGHTNESS Jerrol Banana., MD Taking Active            Med Note (Gerritt Galentine E   Tue  Jun 26, 2019  4:24 PM) Hasnt normally needed second dose  glucose blood test strip [id number] Yes Use with meter twice daily Jerrol Banana., MD Taking Active   hydrochlorothiazide (HYDRODIURIL) 12.5 MG tablet [id number] Yes TAKE 2 TABLETS (25 MG TOTAL) BY MOUTH AT BEDTIME. Jerrol Banana., MD Taking Active            Med Note [facility], Laddie Math E   Tue Jun 26, 2019  4:25 PM) Bedtime??  insulin aspart (NOVOLOG FLEXPEN) 100 UNIT/ML FlexPen [id number] Yes Inject 12 Units into the skin 3 (three) times daily with meals.  Patient taking differently: Inject 14 Units into the skin 3 (three) times daily with meals.    Jerrol Banana., MD Taking Active   Insulin Pen Needle (PEN NEEDLES) 31G X 5 MM MISC [id number] Yes 1 each by Does not apply route daily. PATIENT NEEDS NOVA TWIST NEEDLES 5 MM  DX E11.9 Jerrol Banana., MD Taking Active Spouse/Significant Other  JANUVIA 100 MG tablet [id number] Yes TAKE 1 TABLET DAILY Mar Daring, PA-C Taking Active   LANTUS SOLOSTAR 100 UNIT/ML Solostar Pen [id number] Yes Inject 35 Units into the skin 2 (two) times daily.  Patient taking differently: Inject 38 Units into the skin 2 (two) times daily.    Jerrol Banana., MD Taking Active   liraglutide (VICTOZA) 18 MG/3ML [facility] [id number] Yes INJECT 0.3ML (=1.8MG )      SUBCUTANEOUSLY DAILY Jerrol Banana., MD Taking Active   magnesium oxide (MAG-OX) 400 MG tablet [id number] Yes TAKE 1 TABLET (400 MG TOTAL) BY MOUTH 2 (TWO) TIMES DAILY. Jerrol Banana., MD Taking Active            Med Note Kary Kos, Adrina Armijo E   Tue Jun 26, 2019  4:30 PM) ??  metoprolol tartrate (LOPRESSOR) 100 MG tablet [id number] Yes TAKE 1 TABLET (100 MG TOTAL) BY MOUTH 2 (TWO) TIMES DAILY. Jerrol Banana., MD Taking Active   Multiple Vitamins-Minerals (CENTRUM SILVER 50+MEN) TABS [id number] Yes Take by mouth daily. [provider] Taking Active   NOVOTWIST 32G X 5 MM MISC [id number] Yes USE 6 TIMES A DAY DX E11.9 Jerrol Banana., MD Taking Active   Omega-3 Fatty Acids (FISH OIL) 1200 MG CAPS [id number] Yes Take 1,200 mg by mouth 2 (two) times daily.  [provider] Taking Active Spouse/Significant Other  oxyCODONE (OXYCONTIN) 40 mg 12 hr tablet [id number] Yes Take 1 tablet (40 mg total) by mouth every 8 (eight) hours as needed. Jerrol Banana., MD Taking Active   pregabalin (LYRICA) 200 MG capsule [id number] Yes Take 1 capsule (200 mg total) by mouth 3 (three) times daily. Jerrol Banana., MD Taking Active            Med Note Kary Kos, Merlina Marchena E   Tue Jun 26, 2019  4:31 PM)    sucralfate (CARAFATE) 1 g tablet  [id number] Yes TAKE 1 TABLET (1 G TOTAL) BY MOUTH 4 (FOUR) TIMES DAILY - WITH MEALS AND AT BEDTIME. Trinna Post, PA-C Taking Active   testosterone cypionate (DEPOTESTOSTERONE CYPIONATE) 200 MG/ML injection [id number] Yes Inject 200 mg into the muscle every 21 ( twenty-one) days.  [provider] Taking Active   Med List Note Marylynn Pearson 11/25/16 8144): PATIENT USES BIPAP 14/9 AT NIGHT            Assessment:  #Leg pain  #T2DM: Patient taking Januvia, 1.8mg  Victoza,  38 units of Lantus, and 14 units of Novolog TIDAC and FBG still uncontrolled. In the past 1 week, range from 136 mg/dL to 220s mg/dL. A1c (06/14/19) is 8.6%, increased from 7.7% 7 months ago.  Patient states that his morning BG are usually better when he has an evening snack such as a Little Debbie oatmeal cookie  Goals Addressed            This Visit's Progress   . Diabetes (pt-stated)       Current Barriers:  [name] Knowledge Deficits related to basic Diabetes pathophysiology and self care/management . Knowledge Deficits related to medications used for management of diabetes  Clinical Pharmacist Goal(s):  Over the next 30 days, patient will demonstrate improved adherence to prescribed treatment plan for diabetes self care/management as evidenced by:  [name] Recorded morning blood sugar <200 mg/dL . adherence to ADA/ carb modified diet  Interventions:  . Provided education to patient about basic DM disease process o One month trial of Vitamin B12 for neuropathy . Recommendation to Dr. [name]:  o Discontinue Januvia (DPP4 inhibitor) as not much affect when also taking a GLP1 agonist (Victoza)  o Patient still showing uncontrolled diabetes based on self reported BG, consider adding Jardiance or increasing Lantus  Patient Self Care Activities:  . Self administers insulin as prescribed . Self administers injectable DM medication (Victoza) as prescribed . Checks blood sugars as prescribed and utilize  hyper and hypoglycemia protocol as needed . Adheres to prescribed ADA/carb modified  Please see past updates related to this goal by clicking on the "Past Updates" button in the selected goal         Plan: Recommendations discussed with provider - Discontinue Januvia (no benefit in DPP4 when taking GLP1) and consider increasing Lantus or adding SGLT2 such as Jardiance  -  Patient continuing to experience severe neuropathy, especially at night; already at max dose for Lyrica; should he switch back to gabapentin for more flexibility in dosing?     Follow up: Follow up with provider re: neuropathy treatment- gabapentin for Lyrica in 1 week  Ruben Reason, PharmD Clinical Pharmacist [place] [phone]

## 2019-07-23 NOTE — Patient Instructions (Addendum)
Try compression hose for swelling, and foot tingling   Medication Instructions:  See if [name] will increase the evening dose of neurontin  If you need a refill on your cardiac medications before your next appointment, please call your pharmacy.    Lab work: No new labs needed   If you have labs (blood work) drawn today and your tests are completely normal, you will receive your results only by: [name] MyChart Message (if you have MyChart) OR . A paper copy in the mail If you have any lab test that is abnormal or we need to change your treatment, we will call you to review the results.   Testing/Procedures: No new testing needed   Follow-Up: At [facility], you and your health needs are our priority.  As part of our continuing mission to provide you with exceptional heart care, we have created designated Provider Care Teams.  These Care Teams include your primary Cardiologist (physician) and Advanced Practice Providers (APPs -  Physician Assistants and Nurse Practitioners) who all work together to provide you with the care you need, when you need it.  . You will need a follow up appointment in 12 months .   Please call our office 2 months in advance to schedule this appointment.    . Providers on your designated Care Team:   . Murray Hodgkins, NP . Christell Faith, PA-C . Marrianne Mood, PA-C  Any Other Special Instructions Will Be Listed Below (If Applicable).  For educational health videos Log in to : [email or web address] Or : [email or web address], password : [email or web address]

## 2019-07-23 NOTE — Patient Instructions (Signed)
Goals Addressed            This Visit's Progress   . Diabetes (pt-stated)       Current Barriers:  [name] Knowledge Deficits related to basic Diabetes pathophysiology and self care/management . Knowledge Deficits related to medications used for management of diabetes  Clinical Pharmacist Goal(s):  Over the next 30 days, patient will demonstrate improved adherence to prescribed treatment plan for diabetes self care/management as evidenced by:  [name] Recorded morning blood sugar <200 mg/dL . adherence to ADA/ carb modified diet  Interventions:  . Provided education to patient about basic DM disease process o One month trial of Vitamin B12 for neuropathy . Recommendation to Dr. [name]:  o Discontinue Januvia (DPP4 inhibitor) as not much affect when also taking a GLP1 agonist (Victoza)  o Patient still showing uncontrolled diabetes based on self reported BG, consider adding Jardiance or increasing Lantus  Patient Self Care Activities:  . Self administers insulin as prescribed . Self administers injectable DM medication (Victoza) as prescribed . Checks blood sugars as prescribed and utilize hyper and hypoglycemia protocol as needed . Adheres to prescribed ADA/carb modified  Please see past updates related to this goal by clicking on the "Past Updates" button in the selected goal

## 2019-07-25 DIAGNOSIS — E291 Testicular hypofunction: Secondary | ICD-10-CM | POA: Diagnosis not present

## 2019-07-25 DIAGNOSIS — N451 Epididymitis: Secondary | ICD-10-CM | POA: Diagnosis not present

## 2019-07-25 DIAGNOSIS — Z79899 Other long term (current) drug therapy: Secondary | ICD-10-CM | POA: Diagnosis not present

## 2019-07-25 DIAGNOSIS — R102 Pelvic and perineal pain: Secondary | ICD-10-CM | POA: Diagnosis not present

## 2019-07-25 DIAGNOSIS — N401 Enlarged prostate with lower urinary tract symptoms: Secondary | ICD-10-CM | POA: Diagnosis not present

## 2019-07-27 ENCOUNTER — Ambulatory Visit (INDEPENDENT_AMBULATORY_CARE_PROVIDER_SITE_OTHER): Payer: Medicare Other | Admitting: Pharmacist

## 2019-07-27 DIAGNOSIS — E782 Mixed hyperlipidemia: Secondary | ICD-10-CM | POA: Diagnosis not present

## 2019-07-27 DIAGNOSIS — IMO0002 Reserved for concepts with insufficient information to code with codable children: Secondary | ICD-10-CM

## 2019-07-27 DIAGNOSIS — E118 Type 2 diabetes mellitus with unspecified complications: Secondary | ICD-10-CM | POA: Diagnosis not present

## 2019-07-27 DIAGNOSIS — E1165 Type 2 diabetes mellitus with hyperglycemia: Secondary | ICD-10-CM | POA: Diagnosis not present

## 2019-07-27 DIAGNOSIS — G629 Polyneuropathy, unspecified: Secondary | ICD-10-CM

## 2019-07-28 NOTE — Chronic Care Management (AMB) (Signed)
  Chronic Care Management   Care Coordination Note  07/28/2019 Name: Jerry Parsons MRN: [id number] DOB: 11/10/1951  Care Coordination note for [name], 68 year old male patient of Dr. Miguel Aschoff. CCM pharmacist consulted for medication management.   Extensive workup for diabetes regiment today.   Lab Results  Component Value Date   HGBA1C 8.6 (A) 06/14/2019   HGBA1C 8.0 (H) 02/05/2019   HGBA1C 7.7 (H) 11/03/2018   Lab Results  Component Value Date   MICROALBUR 20 09/14/2017   LDLCALC 42 06/12/2018   CREATININE 1.38 (H) 11/03/2018    Current DM regimen is Lantus 38 units BID, Novolog 14 units at meals, Victoza at max dose of 1.8mg  daily, and Januvia 100mg ; metformin was DC in April 2019 when patient was hospitalized for sepsis, had lactic acidosis as result of the sepsis. No statin, ACE/ARB.   Given patient's young age and complications of chronic pain limiting his exercise capacity, could be a good candidate for CGM to figure out what is truly going on wrt to his diet and blood sugars. Could be a candidate for SGLT2.    Follow up plan: Telephone follow up appointment with care management team member scheduled for: 5-7 days via telephone following collaboration with Dr. [name]  Follow up with provider re: medication management .  Ruben Reason, PharmD Clinical Pharmacist [place] [phone]

## 2019-08-09 ENCOUNTER — Other Ambulatory Visit: Payer: Self-pay | Admitting: Family Medicine

## 2019-08-09 MED ORDER — OXYCODONE HCL ER 40 MG PO T12A: 40.0000 mg | EXTENDED_RELEASE_TABLET | Freq: Three times a day (TID) | ORAL | 0 refills | Status: DC | PRN

## 2019-08-09 NOTE — Telephone Encounter (Signed)
Please review. Thanks!  

## 2019-08-09 NOTE — Telephone Encounter (Signed)
Patient needs refill on Oxycontin 40 mg. Sent to [facility] mail order pharmacy

## 2019-08-14 ENCOUNTER — Other Ambulatory Visit: Payer: Self-pay | Admitting: Physician Assistant

## 2019-08-14 DIAGNOSIS — R110 Nausea: Secondary | ICD-10-CM

## 2019-08-20 IMAGING — CR DG ABDOMEN 1V
1 series · 4 of 4 positions shown · non-contrast
Comparison: None.

CLINICAL DATA: Abdominal abdominal pain and distension,
constipation for 1 month.

EXAM:
ABDOMEN - 1 VIEW

[Series 1: dg abd 1 view · 0.14mm/px · 4 of 4 slices shown]
[im 1/4]
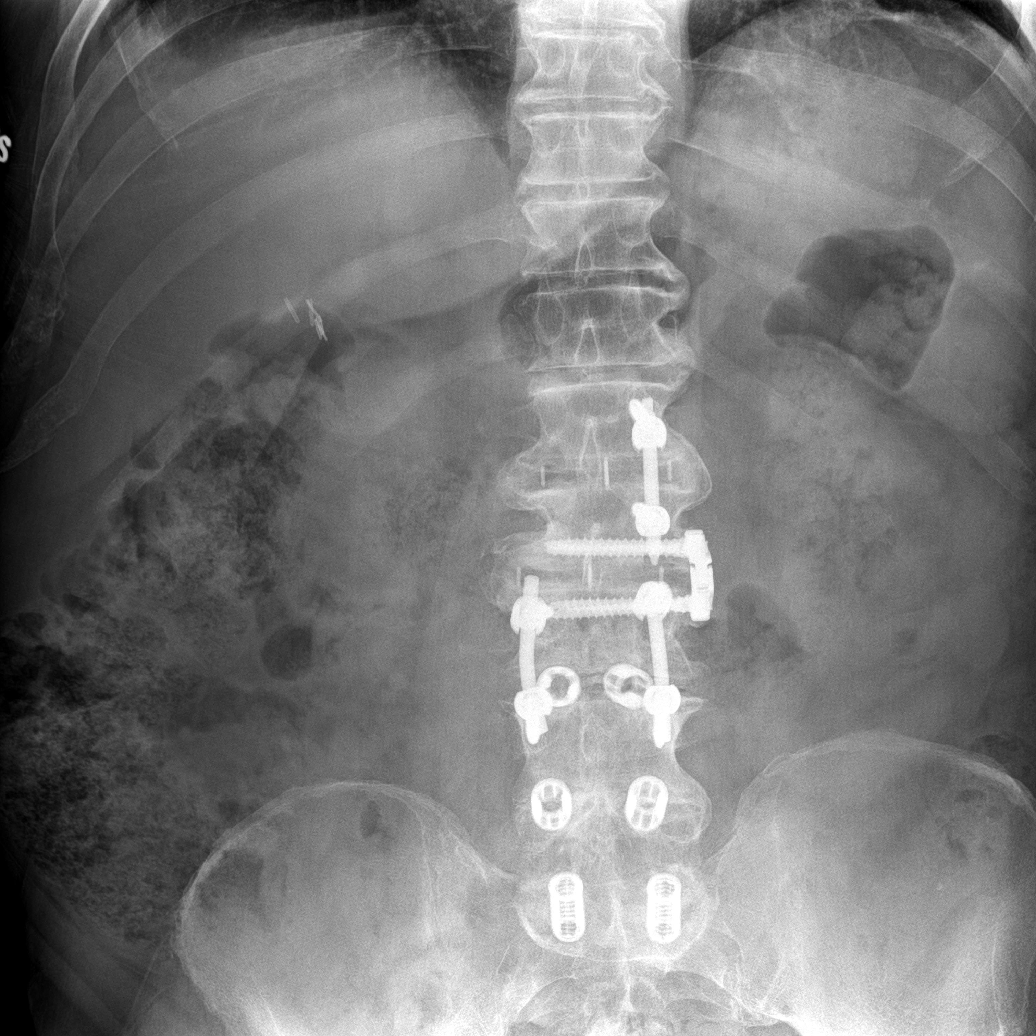
[im 2/4]
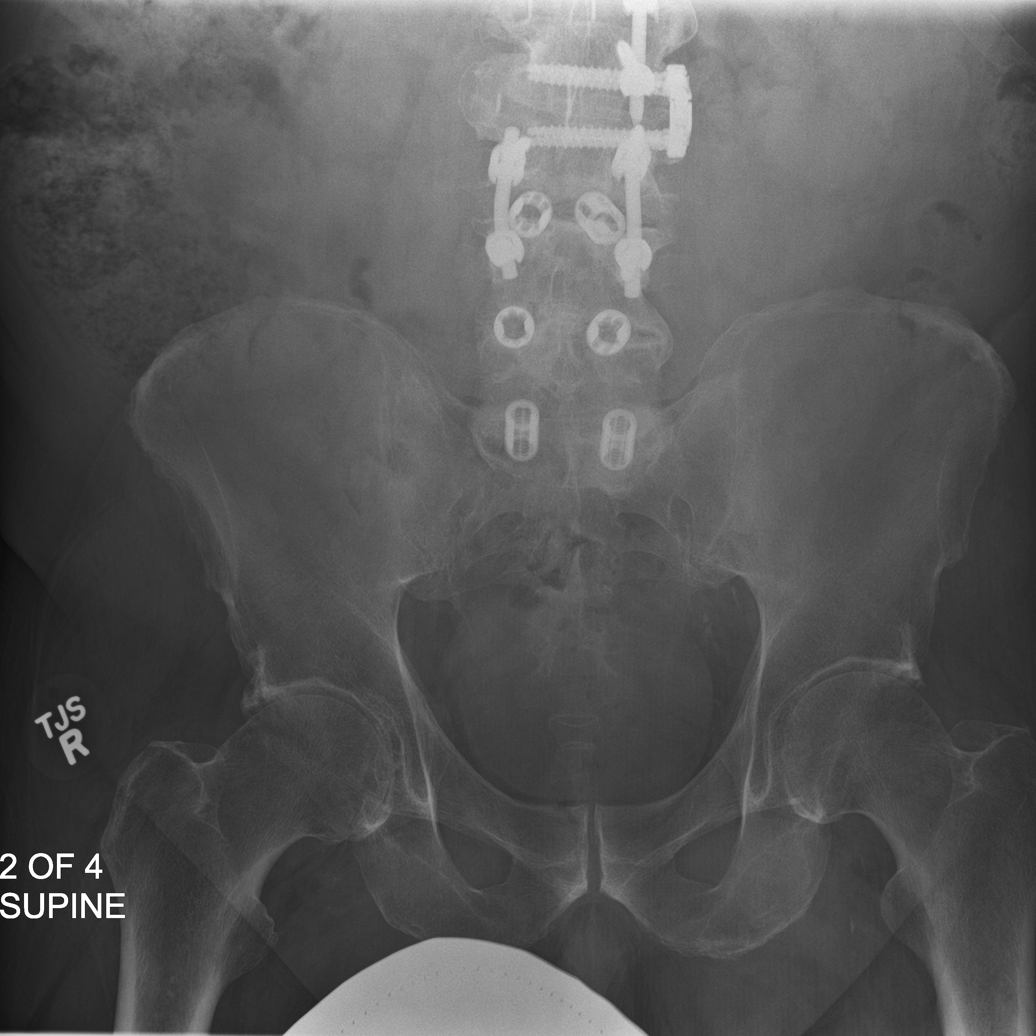
[im 3/4]
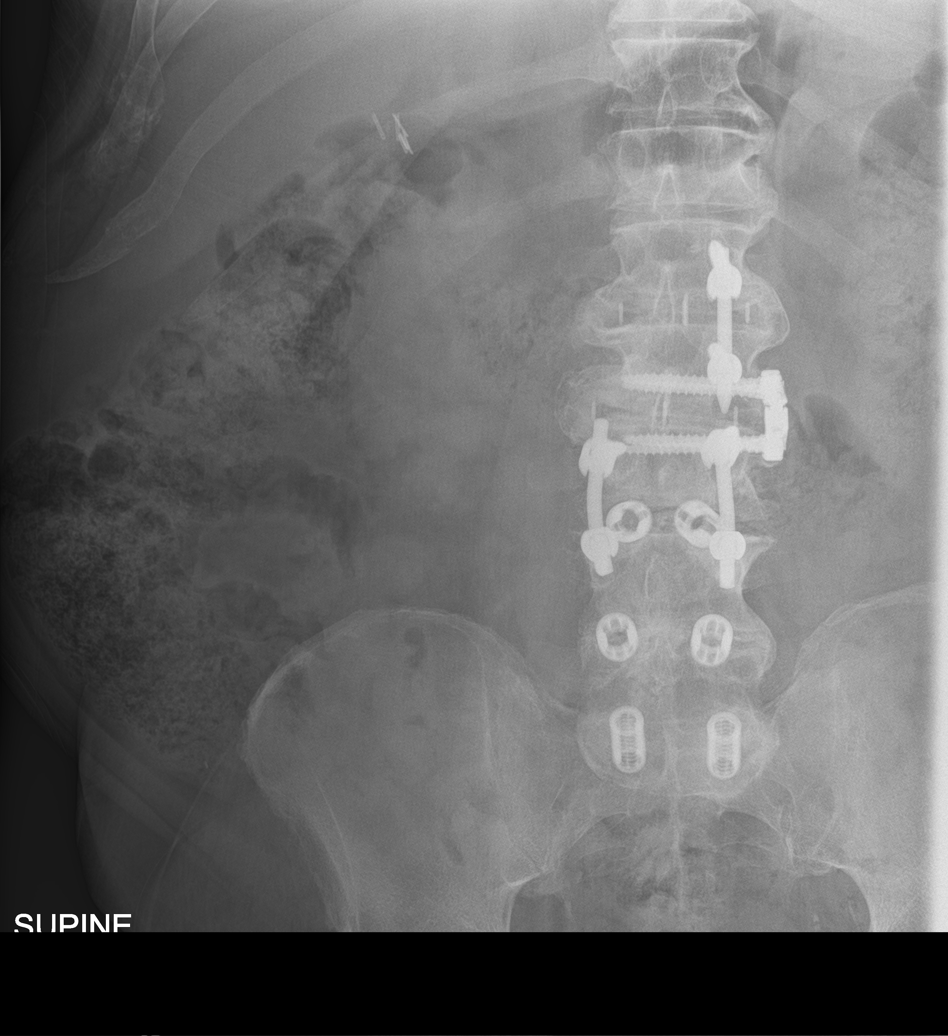
[im 4/4]
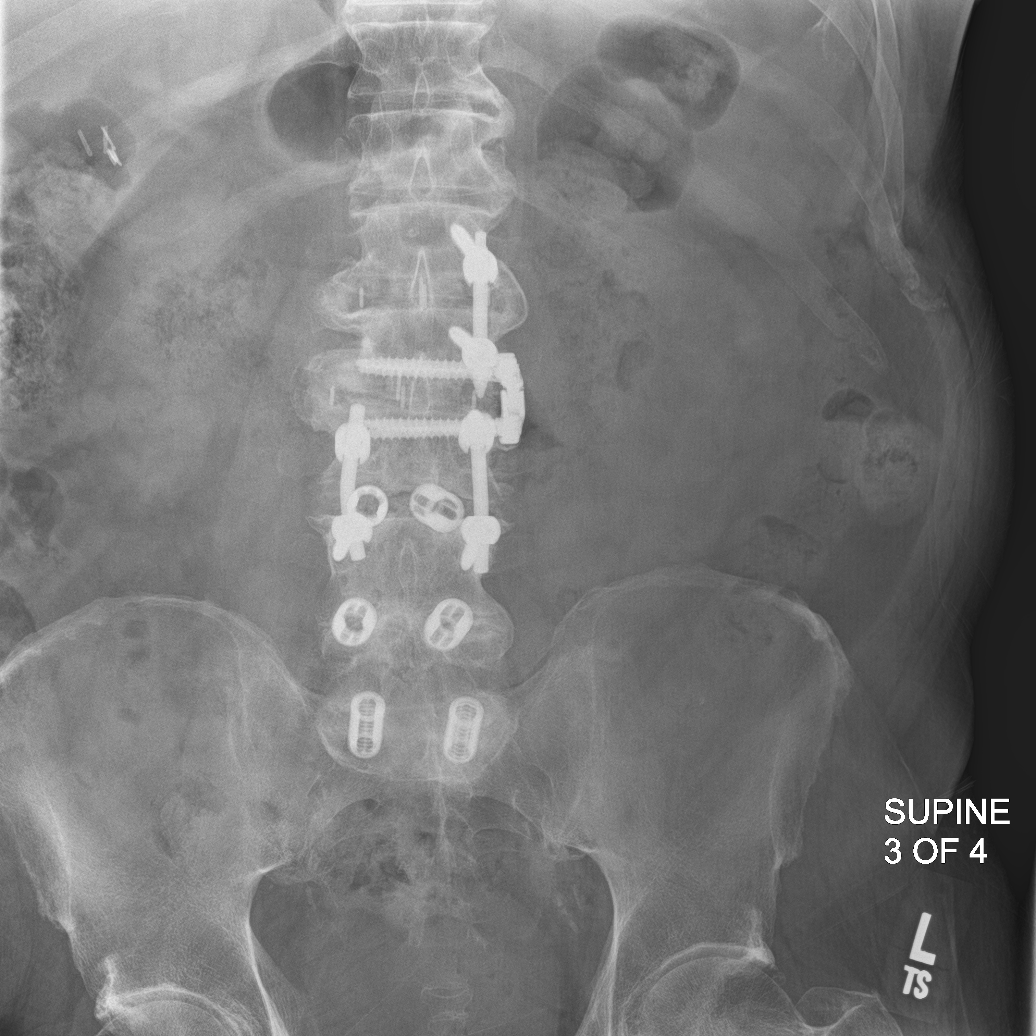

[4 of 4 positions shown; findings below may reference images not displayed]

FINDINGS: The bowel gas pattern is normal. Large amount of retained large
bowel stool. Surgical clips in the included right abdomen compatible
with cholecystectomy. No radio-opaque calculi or other significant
radiographic abnormality are seen. Lumbar spine hardware.
IMPRESSION: Large amount of retained large bowel stool without bowel
obstruction.

## 2019-08-29 DIAGNOSIS — E291 Testicular hypofunction: Secondary | ICD-10-CM | POA: Diagnosis not present

## 2019-09-05 ENCOUNTER — Other Ambulatory Visit: Payer: Self-pay | Admitting: Cardiovascular Disease

## 2019-09-10 ENCOUNTER — Other Ambulatory Visit: Payer: Self-pay | Admitting: Family Medicine

## 2019-09-10 DIAGNOSIS — G629 Polyneuropathy, unspecified: Secondary | ICD-10-CM

## 2019-09-11 ENCOUNTER — Other Ambulatory Visit: Payer: Self-pay | Admitting: Family Medicine

## 2019-09-11 DIAGNOSIS — R110 Nausea: Secondary | ICD-10-CM

## 2019-09-12 ENCOUNTER — Other Ambulatory Visit: Payer: Self-pay

## 2019-09-12 ENCOUNTER — Encounter: Payer: Self-pay | Admitting: Family Medicine

## 2019-09-12 ENCOUNTER — Ambulatory Visit (INDEPENDENT_AMBULATORY_CARE_PROVIDER_SITE_OTHER): Payer: Medicare Other | Admitting: Family Medicine

## 2019-09-12 VITALS — BP 136/68 | HR 70 | Temp 97.6°F | Resp 18 | Wt 276.2 lb

## 2019-09-12 DIAGNOSIS — Z23 Encounter for immunization: Secondary | ICD-10-CM | POA: Diagnosis not present

## 2019-09-12 DIAGNOSIS — E118 Type 2 diabetes mellitus with unspecified complications: Secondary | ICD-10-CM | POA: Diagnosis not present

## 2019-09-12 DIAGNOSIS — I10 Essential (primary) hypertension: Secondary | ICD-10-CM | POA: Diagnosis not present

## 2019-09-12 DIAGNOSIS — IMO0002 Reserved for concepts with insufficient information to code with codable children: Secondary | ICD-10-CM

## 2019-09-12 DIAGNOSIS — M48061 Spinal stenosis, lumbar region without neurogenic claudication: Secondary | ICD-10-CM | POA: Diagnosis not present

## 2019-09-12 DIAGNOSIS — I480 Paroxysmal atrial fibrillation: Secondary | ICD-10-CM | POA: Diagnosis not present

## 2019-09-12 DIAGNOSIS — G4733 Obstructive sleep apnea (adult) (pediatric): Secondary | ICD-10-CM

## 2019-09-12 DIAGNOSIS — E1165 Type 2 diabetes mellitus with hyperglycemia: Secondary | ICD-10-CM | POA: Diagnosis not present

## 2019-09-12 DIAGNOSIS — Z9989 Dependence on other enabling machines and devices: Secondary | ICD-10-CM | POA: Diagnosis not present

## 2019-09-12 LAB — POCT GLYCOSYLATED HEMOGLOBIN (HGB A1C): Hemoglobin A1C: 8.9 % — AB (ref 4.0–5.6)

## 2019-09-12 NOTE — Progress Notes (Signed)
[name]    DOB: Dec 23, 1951   68 y.o.   MRN: [id number] Visit Date: 09/12/2019  Today's Provider: [name], MD   Chief Complaint  Patient presents with  . Diabetes  . Follow-up   Subjective:     HPI 3 Month diabetes follow up. He has been trying to eat well but has not been checking BS at home. He fell recently trying to push a riding lawn mower that got stuck. Otherwise feeling well./stable. Allergies  Allergen Reactions  . Carisoprodol Itching     Current Outpatient Medications:  .  amiodarone (PACERONE) 200 MG tablet, TAKE 1 TABLET BY MOUTH EVERY DAY, Disp: 90 tablet, Rfl: 2 .  aspirin 81 MG tablet, Take 81 mg by mouth daily., Disp: , Rfl:  .  cholecalciferol (VITAMIN D3) 25 MCG (1000 UT) tablet, Take 1,000 Units by mouth daily., Disp: , Rfl:  .  cyclobenzaprine (FLEXERIL) 10 MG tablet, TAKE 1 TABLET BY MOUTH 3 TIMES A DAY, Disp: 90 tablet, Rfl: 12 .  Docusate Sodium (STOOL SOFTENER LAXATIVE PO), Take by mouth., Disp: , Rfl:  .  dutasteride (AVODART) 0.5 MG capsule, TAKE 1 CAPSULE DAILY, Disp: 90 capsule, Rfl: 3 .  esomeprazole (NEXIUM) 40 MG capsule, TAKE 1 CAPSULE TWICE DAILY, Disp: 90 capsule, Rfl: 3 .  fexofenadine (ALLEGRA) 180 MG tablet, Take 180 mg by mouth daily.  , Disp: , Rfl:  .  furosemide (LASIX) 40 MG tablet, TAKE 1 TABLET (40 MG) BY MOUTH ONCE DAILY, YOU MAY TAKE 1 EXTRA TABLET (40 MG) AFTER LUNCH AS NEEDED FOR LEG SWELLING/ ABDOMINAL TIGHTNESS, Disp: 180 tablet, Rfl: 1 .  glucose blood test strip, Use with meter twice daily, Disp: 100 each, Rfl: 3 .  hydrochlorothiazide (HYDRODIURIL) 12.5 MG tablet, TAKE 2 TABLETS (25 MG TOTAL) BY MOUTH AT BEDTIME., Disp: 180 tablet, Rfl: 1 .  insulin aspart (NOVOLOG FLEXPEN) 100 UNIT/ML FlexPen, Inject 12 Units into the skin 3 (three) times daily with meals. (Patient taking differently: Inject 14 Units into the skin 3 (three) times daily with meals. ), Disp: 45 mL, Rfl: 3 .  Insulin Pen  Needle (PEN NEEDLES) 31G X 5 MM MISC, 1 each by Does not apply route daily. PATIENT NEEDS NOVA TWIST NEEDLES 5 MM  DX E11.9, Disp: 200 each, Rfl: 3 .  JANUVIA 100 MG tablet, TAKE 1 TABLET DAILY, Disp: 90 tablet, Rfl: 3 .  LANTUS SOLOSTAR 100 UNIT/ML Solostar Pen, Inject 35 Units into the skin 2 (two) times daily. (Patient taking differently: Inject 38 Units into the skin 2 (two) times daily. ), Disp: 45 mL, Rfl: 3 .  liraglutide (VICTOZA) 18 MG/3ML SOPN, INJECT 0.3ML (=1.8MG )      SUBCUTANEOUSLY DAILY, Disp: 27 mL, Rfl: 3 .  magnesium oxide (MAG-OX) 400 MG tablet, TAKE 1 TABLET (400 MG TOTAL) BY MOUTH 2 (TWO) TIMES DAILY., Disp: 180 tablet, Rfl: 3 .  metoprolol tartrate (LOPRESSOR) 100 MG tablet, TAKE 1 TABLET (100 MG TOTAL) BY MOUTH 2 (TWO) TIMES DAILY., Disp: 180 tablet, Rfl: 3 .  NOVOTWIST 32G X 5 MM MISC, USE 6 TIMES A DAY DX E11.9, Disp: 100 each, Rfl: 9 .  Omega-3 Fatty Acids (FISH OIL) 1200 MG CAPS, Take 1,200 mg by mouth 2 (two) times daily. , Disp: , Rfl:  .  oxyCODONE (OXYCONTIN) 40 mg 12 hr tablet, Take 1 tablet (40 mg total) by mouth every 8 (eight) hours as needed., Disp: 270 tablet, Rfl: 0 .  pregabalin (LYRICA) 200 MG capsule, Take 1 capsule (200 mg total) by mouth 3 (three) times daily., Disp: 270 capsule, Rfl: 3 .  sucralfate (CARAFATE) 1 g tablet, TAKE 1 TABLET BY MOUTH 4 (FOUR) TIMES DAILY - WITH MEALS AND AT BEDTIME., Disp: 360 tablet, Rfl: 1 .  sulfamethoxazole-trimethoprim (BACTRIM) 400-80 MG tablet, Take 1 tablet by mouth 2 (two) times daily., Disp: , Rfl:  .  testosterone cypionate (DEPOTESTOSTERONE CYPIONATE) 200 MG/ML injection, Inject 200 mg into the muscle every 21 ( twenty-one) days. , Disp: , Rfl:  .  famotidine (PEPCID) 20 MG tablet, Take 1 tablet (20 mg total) by mouth daily. (Patient not taking: Reported on 09/12/2019), Disp: 30 tablet, Rfl: 12 .  gabapentin (NEURONTIN) 100 MG capsule, TAKE 1 CAPSULE BY MOUTH TWICE A DAY (Patient not taking: Reported on 09/12/2019), Disp:  180 capsule, Rfl: 2 .  Multiple Vitamins-Minerals (CENTRUM SILVER 50+MEN) TABS, Take by mouth daily., Disp: , Rfl:   Review of Systems  Constitutional: Negative.   HENT: Negative.   Eyes: Negative.   Respiratory: Negative.   Cardiovascular: Negative for chest pain and leg swelling.  Gastrointestinal: Negative.   Endocrine: Negative for cold intolerance, heat intolerance, polydipsia, polyphagia and polyuria.  Genitourinary: Negative.   Musculoskeletal: Negative.   Allergic/Immunologic: Negative.   Neurological: Positive for weakness. Negative for dizziness, light-headedness and headaches.  Psychiatric/Behavioral: Negative.   All other systems reviewed and are negative.   Social History   Tobacco Use  . Smoking status: Former Smoker    Packs/day: 2.00    Years: 20.00    Pack years: 40.00    Types: Cigarettes    Quit date: 12/26/1989    Years since quitting: 29.7  . Smokeless tobacco: Never Used  Substance Use Topics  . Alcohol use: No      Objective:   BP 136/68 (BP Location: Left Arm, Patient Position: Sitting, Cuff Size: Large)   Pulse 70   Temp 97.6 F (36.4 C)   Resp 18   Wt 276 lb 3.2 oz (125.3 kg)   SpO2 95%   BMI 39.63 kg/m  Vitals:   09/12/19 1346  BP: 136/68  Pulse: 70  Resp: 18  Temp: 97.6 F (36.4 C)  SpO2: 95%  Weight: 276 lb 3.2 oz (125.3 kg)  Body mass index is 39.63 kg/m.   Physical Exam Vitals signs reviewed.  Constitutional:      Appearance: He is well-developed. He is obese.  HENT:     Head: Normocephalic and atraumatic.     Right Ear: External ear normal.     Left Ear: External ear normal.     Nose: Nose normal.  Eyes:     General: No scleral icterus.    Conjunctiva/sclera: Conjunctivae normal.  Neck:     Thyroid: No thyromegaly.  Cardiovascular:     Rate and Rhythm: Normal rate and regular rhythm.     Heart sounds: Normal heart sounds.  Pulmonary:     Effort: Pulmonary effort is normal.     Breath sounds: Normal breath  sounds.  Abdominal:     Palpations: Abdomen is soft.  Skin:    General: Skin is warm and dry.     Comments: Both knees with clean abrasions from recent fall.  Neurological:     Mental Status: He is alert and oriented to person, place, and time. Mental status is at baseline.  Psychiatric:        Mood and Affect: Mood normal.  Behavior: Behavior normal.        Thought Content: Thought content normal.        Judgment: Judgment normal.      Results for orders placed or performed in visit on 09/12/19  POCT HgB A1C  Result Value Ref Range   Hemoglobin A1C 8.9 (A) 4.0 - 5.6 %   HbA1c POC (<> result, manual entry)     HbA1c, POC (prediabetic range)     HbA1c, POC (controlled diabetic range)         Assessment & Plan    1. Diabetes mellitus type 2, uncontrolled, with complications ([place]) More than 50% 25 minute visit spent in counseling or coordination of care. - POCT HgB A1C--8.2 today--last was 8.9. Pt to start checking BS at home. 2. Need for immunization against influenza  - Flu vaccine HIGH DOSE PF  3. Paroxysmal atrial fibrillation (HCC)   4. HYPERTENSION, BENIGN   5. OSA on CPAP   6. Spinal stenosis of lumbar region, unspecified whether neurogenic claudication present  7. Hypogonadism     [name], MD  [facility]

## 2019-09-13 ENCOUNTER — Ambulatory Visit: Payer: Self-pay | Admitting: Family Medicine

## 2019-09-23 ENCOUNTER — Other Ambulatory Visit: Payer: Self-pay | Admitting: Physician Assistant

## 2019-09-26 NOTE — Telephone Encounter (Signed)
See Rx request below.  Thanks

## 2019-10-03 DIAGNOSIS — E291 Testicular hypofunction: Secondary | ICD-10-CM | POA: Diagnosis not present

## 2019-10-09 ENCOUNTER — Ambulatory Visit: Payer: Medicare Other

## 2019-10-22 NOTE — Progress Notes (Signed)
Pt had to cancel AWV today and r/s to another day. Note opened in advance. This encounter was created in error - please disregard.

## 2019-10-23 ENCOUNTER — Other Ambulatory Visit: Payer: Self-pay

## 2019-10-23 ENCOUNTER — Ambulatory Visit: Payer: Medicare Other | Admitting: Sports Medicine

## 2019-10-23 ENCOUNTER — Encounter: Payer: Self-pay | Admitting: Sports Medicine

## 2019-10-23 ENCOUNTER — Ambulatory Visit (INDEPENDENT_AMBULATORY_CARE_PROVIDER_SITE_OTHER): Payer: Medicare Other | Admitting: Sports Medicine

## 2019-10-23 DIAGNOSIS — M79675 Pain in left toe(s): Secondary | ICD-10-CM | POA: Diagnosis not present

## 2019-10-23 DIAGNOSIS — M79674 Pain in right toe(s): Secondary | ICD-10-CM

## 2019-10-23 DIAGNOSIS — B351 Tinea unguium: Secondary | ICD-10-CM | POA: Diagnosis not present

## 2019-10-23 DIAGNOSIS — E1142 Type 2 diabetes mellitus with diabetic polyneuropathy: Secondary | ICD-10-CM

## 2019-10-23 DIAGNOSIS — L74513 Primary focal hyperhidrosis, soles: Secondary | ICD-10-CM

## 2019-10-23 MED ORDER — DRYSOL 20 % EX SOLN: Freq: Every day | CUTANEOUS | 5 refills | Status: DC

## 2019-10-23 NOTE — Progress Notes (Signed)
Subjective: Jerry Parsons is a 68 y.o. male patient with history of diabetes who presents to office today complaining of long,mildly painful nails while ambulating in shoes; unable to trim.  Admits to sweaty feet and also an odor that improved after he threw away some old shoes and also added a foot powder. Patient states that the glucose reading this morning was 172mg /dl. Patient denies any new changes in medication or new problems.   Patient Active Problem List   Diagnosis Date Noted  . Osteoarthritis of both knees 11/11/2018  . OSA on CPAP 10/14/2018  . MRSA bacteremia 05/19/2018  . Lumbar spondylosis 05/05/2018  . Spinal stenosis of lumbar region 05/05/2018  . Constipation, chronic 03/08/2018  . Pain in limb 03/03/2018  . Gastroesophageal reflux disease without esophagitis 01/04/2018  . OA (osteoarthritis) of knee 06/20/2017  . Sepsis due to pneumonia ([place]) 05/07/2017  . Neuropathy 10/13/2016  . Amblyopia 12/30/2015  . 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 NS (nuclear sclerosis) 12/30/2015  . Pseudoaphakia 12/30/2015  . Dehydration 09/08/2015  . Aspiration pneumonia ([place]) 07/04/2015  . Paroxysmal atrial fibrillation ([place]) 07/04/2015  . Arthritis of knee, degenerative 05/28/2015  . Chronic back pain 09/17/2014  . Leg edema 05/07/2014  . Chronic diastolic CHF (congestive heart failure) ([place]) 05/07/2014  . Campylobacter diarrhea 04/25/2013  . Atrial flutter ([place]) 01/23/2013  . Obesity 05/19/2012  . Hyperlipidemia 11/11/2011  . Diastolic dysfunction 0000000  . SOB (shortness of breath) 04/12/2011  . Diabetes mellitus type 2, uncontrolled, with complications ([place]) [id number]  . HYPERTENSION, BENIGN 03/15/2011  . DVT 03/15/2011  . TACHYCARDIA 03/15/2011   Current Outpatient Medications on File Prior to Visit  Medication Sig Dispense Refill  . amiodarone (PACERONE) 200 MG tablet TAKE 1 TABLET BY MOUTH EVERY DAY 90 tablet 2  . aspirin 81 MG tablet Take 81 mg by mouth daily.    .  cholecalciferol (VITAMIN D3) 25 MCG (1000 UT) tablet Take 1,000 Units by mouth daily.    . cyclobenzaprine (FLEXERIL) 10 MG tablet TAKE 1 TABLET BY MOUTH 3 TIMES A DAY 90 tablet 12  . docusate sodium (COLACE) 250 MG capsule Take by mouth.    Mariane Baumgarten Sodium (STOOL SOFTENER LAXATIVE PO) Take by mouth.    . dutasteride (AVODART) 0.5 MG capsule TAKE 1 CAPSULE DAILY 90 capsule 3  . esomeprazole (NEXIUM) 40 MG capsule TAKE 1 CAPSULE TWICE DAILY 90 capsule 3  . famotidine (PEPCID) 20 MG tablet Take 1 tablet (20 mg total) by mouth daily. 30 tablet 12  . fexofenadine (ALLEGRA) 180 MG tablet Take 180 mg by mouth daily.      . furosemide (LASIX) 40 MG tablet TAKE 1 TABLET (40 MG) BY MOUTH ONCE DAILY, YOU MAY TAKE 1 EXTRA TABLET (40 MG) AFTER LUNCH AS NEEDED FOR LEG SWELLING/ ABDOMINAL TIGHTNESS 180 tablet 1  . gabapentin (NEURONTIN) 100 MG capsule TAKE 1 CAPSULE BY MOUTH TWICE A DAY 180 capsule 2  . glucose blood test strip Use with meter twice daily 100 each 3  . hydrochlorothiazide (HYDRODIURIL) 12.5 MG tablet TAKE 2 TABLETS (25 MG TOTAL) BY MOUTH AT BEDTIME. 180 tablet 1  . insulin aspart (NOVOLOG FLEXPEN) 100 UNIT/ML FlexPen Inject 12 Units into the skin 3 (three) times daily with meals. (Patient taking differently: Inject 14 Units into the skin 3 (three) times daily with meals. ) 45 mL 3  . Insulin Pen Needle (PEN NEEDLES) 31G X 5 MM MISC 1 each by Does not apply route daily.  PATIENT NEEDS NOVA TWIST NEEDLES 5 MM  DX E11.9 200 each 3  . JANUVIA 100 MG tablet TAKE 1 TABLET DAILY 90 tablet 3  . LANTUS SOLOSTAR 100 UNIT/ML Solostar Pen Inject 35 Units into the skin 2 (two) times daily. (Patient taking differently: Inject 38 Units into the skin 2 (two) times daily. ) 45 mL 3  . liraglutide (VICTOZA) 18 MG/3ML SOPN INJECT 0.3ML (=1.8MG )      SUBCUTANEOUSLY DAILY 27 mL 3  . magnesium oxide (MAG-OX) 400 MG tablet TAKE 1 TABLET (400 MG TOTAL) BY MOUTH 2 (TWO) TIMES DAILY. 180 tablet 3  . metoprolol tartrate  (LOPRESSOR) 100 MG tablet TAKE 1 TABLET (100 MG TOTAL) BY MOUTH 2 (TWO) TIMES DAILY. 180 tablet 3  . Multiple Vitamins-Minerals (CENTRUM SILVER 50+MEN) TABS Take by mouth daily.    [name] NOVOTWIST 32G X 5 MM MISC USE 6 TIMES A DAY DX E11.9 100 each 9  . Omega-3 Fatty Acids (FISH OIL) 1200 MG CAPS Take 1,200 mg by mouth 2 (two) times daily.     [name] oxyCODONE (OXYCONTIN) 40 mg 12 hr tablet Take 1 tablet (40 mg total) by mouth every 8 (eight) hours as needed. 270 tablet 0  . pregabalin (LYRICA) 200 MG capsule Take 1 capsule (200 mg total) by mouth 3 (three) times daily. 270 capsule 3  . sucralfate (CARAFATE) 1 g tablet TAKE 1 TABLET BY MOUTH 4 (FOUR) TIMES DAILY - WITH MEALS AND AT BEDTIME. 360 tablet 1  . sulfamethoxazole-trimethoprim (BACTRIM) 400-80 MG tablet Take 1 tablet by mouth 2 (two) times daily.    [name] testosterone cypionate (DEPOTESTOSTERONE CYPIONATE) 200 MG/ML injection Inject 200 mg into the muscle every 21 ( twenty-one) days.     . Vitamin D, Ergocalciferol, (DRISDOL) 1.25 MG (50000 UT) CAPS capsule Take by mouth.     No current facility-administered medications on file prior to visit.    Allergies  Allergen Reactions  . Carisoprodol Itching    Recent Results (from the past 2160 hour(s))  POCT HgB A1C     Status: Abnormal   Collection Time: 09/12/19  1:51 PM  Result Value Ref Range   Hemoglobin A1C 8.9 (A) 4.0 - 5.6 %   HbA1c POC (<> result, manual entry)     HbA1c, POC (prediabetic range)     HbA1c, POC (controlled diabetic range)      Objective: General: Patient is awake, alert, and oriented x 3 and in no acute distress.  Integument: Skin is warm, dry and supple bilateral. Nails are tender, long, thickened and dystrophic with subungual debris, consistent with onychomycosis, 1-5 bilateral.Minimal Callus to right and left 1st toe with dry heme that has resolved. No signs of infection. No open lesions or preulcerative lesions present bilateral. Remaining integument unremarkable.   Active odor to both feet.  Vasculature:  Dorsalis Pedis pulse 1/4 bilateral. Posterior Tibial pulse  0/4 bilateral.  Capillary fill time <3 sec 1-5 bilateral. No hair growth to the level of the digits. Temperature gradient within normal limits. No varicosities present bilateral. 1+ pitting edema present bilateral. +Stucco dermatitis due to PVD without infection.   Neurology: The patient has absent sensation measured with a 5.07/10g Semmes Weinstein Monofilament at all pedal sites bilateral. Vibratory sensation absent bilateral with tuning fork. No Babinski sign present bilateral.   Musculoskeletal: Asymptomatic hammertoes pedal deformities noted bilateral. Muscular strength 5/5 in all lower extremity muscular groups bilateral without pain on range of motion. No tenderness with calf compression bilateral.  Assessment and  Plan: Problem List Items Addressed This Visit    None    Visit Diagnoses    Pain due to onychomycosis of toenails of both feet    -  Primary   Sweaty feet       Relevant Medications   aluminum chloride (DRYSOL) 20 % external solution   Diabetic polyneuropathy associated with type 2 diabetes mellitus ([place])         -Examined patient. -Discussed and educated patient on diabetic foot care, especially with  regards to the vascular, neurological and musculoskeletal systems.  -Mechanically debrided all nails 1-5 bilateral using sterile nail nipper and filed with dremel without incident  -Rx Drysol  -Advised good hygiene habits and to continue with powder in shoes to prevent odor -Answered all patient questions -Patient to return  in 2.5 to 3 months for at risk foot care -Patient advised to call the office if any problems or questions arise in the meantime.  Landis Martins, DPM

## 2019-10-30 ENCOUNTER — Telehealth: Payer: Self-pay | Admitting: Family Medicine

## 2019-10-30 ENCOUNTER — Other Ambulatory Visit: Payer: Self-pay | Admitting: Family Medicine

## 2019-10-30 NOTE — Progress Notes (Signed)
Subjective:   Jerry Parsons is a 68 y.o. male who presents for Medicare Annual/Subsequent preventive examination.    This visit is being conducted through telemedicine due to the COVID-19 pandemic. This patient has given me verbal consent via doximity to conduct this visit, patient states they are participating from their home address. Some vital signs may be absent or patient reported.    Patient identification: identified by name, DOB, and current address  Review of Systems:  N/A  Cardiac Risk Factors include: advanced age (>72men, >54 women);diabetes mellitus;dyslipidemia;male gender;hypertension     Objective:    Vitals: There were no vitals taken for this visit.  There is no height or weight on file to calculate BMI. Unable to obtain vitals due to visit being conducted via telephonically.   Advanced Directives 10/31/2019 10/05/2018 04/26/2018 04/22/2018 04/22/2018 01/16/2018 08/31/2017  Does Patient Have a Medical Advance Directive? Yes Yes No Yes Yes Yes Yes  Type of Paramedic of [place];Living will [place];Living will - Healthcare Power of [place] will [place];Living will  Does patient want to make changes to medical advance directive? - - - No - Patient declined - - -  Copy of [place] in Chart? No - copy requested No - copy requested - - Yes - No - copy requested  Would patient like information on creating a medical advance directive? - - No - Patient declined - - - -    Tobacco Social History   Tobacco Use  Smoking Status Former Smoker  . Packs/day: 2.00  . Years: 20.00  . Pack years: 40.00  . Types: Cigarettes  . Quit date: 12/26/1989  . Years since quitting: 29.8  Smokeless Tobacco Never Used     Counseling given: Not Answered   Clinical Intake:  Pre-visit preparation completed: Yes  Pain : 0-10 Pain Score: 5  Pain Type: Chronic pain  Pain Location: Back(and left foot due to neuropathy) Pain Orientation: Lower, Mid, Upper Pain Descriptors / Indicators: Aching, Squeezing, Burning, Throbbing Pain Frequency: Constant     Nutritional Risks: None Diabetes: Yes  How often do you need to have someone help you when you read instructions, pamphlets, or other written materials from your doctor or pharmacy?: 3 - Sometimes(Is blind in the the right eye.)   Diabetes:  Is the patient diabetic?  Yes type 2 If diabetic, was a CBG obtained today?  No  Did the patient bring in their glucometer from home?  No  How often do you monitor your CBG's? Once a day in AM.   Financial Strains and Diabetes Management:  Are you having any financial strains with the device, your supplies or your medication? No .  Does the patient want to be seen by Chronic Care Management for management of their diabetes?  No  Would the patient like to be referred to a Nutritionist or for Diabetic Management?  No   Diabetic Exams:  Diabetic Eye Exam: Completed 01/12/19. Repeat yearly.  Diabetic Foot Exam: Completed 10/23/19. Repeat yearly.  Interpreter Needed?: No  Information entered by :: [facility], LPN  Past Medical History:  Diagnosis Date  . Arrhythmia    tachycardia, A-Fib  . Arthritis   . Blind right eye   . BPH (benign prostatic hyperplasia)   . Diabetes mellitus   . DVT (deep venous thrombosis) ([place]) 02/2010   leg thrombus ; dislodged into emboli and caused PE  .  Dyspnea   . Dysrhythmia   . 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 GERD (gastroesophageal reflux disease)   . Hypercholesteremia   . Hypertension   . Kidney failure   . Neuromuscular disorder ([place])   . Neuropathy   . Pneumonia    time 9 ;last episode 12/2015  . Pulmonary embolus ([place]) 2011  . Seasonal allergies   . Seizures ([place])    as child   . Sleep apnea    BIPAP  . Stiff neck    limited turning s/p titanium plate placement  . Stroke ([place])   .  TIA (transient ischemic attack)   . Wears dentures    full upper and lower   Past Surgical History:  Procedure Laterality Date  . APPENDECTOMY    . BACK SURGERY     x 8; upper x 3 & lower x 5  . CARDIOVERSION  03/14/13, 10/16   2014 - [place], 2016 - [place]  . CATARACT EXTRACTION W/PHACO Left 10/29/2015   Procedure: CATARACT EXTRACTION PHACO AND INTRAOCULAR LENS PLACEMENT (IOC);  Surgeon: Leandrew Koyanagi, MD;  Location: [facility];  Service: Ophthalmology;  Laterality: Left;  DIABETIC - insulin and oral medsSleep apnea - no machine  . CHOLECYSTECTOMY    . COLONOSCOPY WITH PROPOFOL N/A 01/16/2018   Procedure: COLONOSCOPY WITH PROPOFOL;  Surgeon: Manya Silvas, MD;  Location[facility] ENDOSCOPY;  Service: Endoscopy;  Laterality: N/A;  . ESOPHAGOGASTRODUODENOSCOPY (EGD) WITH PROPOFOL N/A 01/16/2018   Procedure: ESOPHAGOGASTRODUODENOSCOPY (EGD) WITH PROPOFOL;  Surgeon: Manya Silvas, MD;  Location: [facility] ENDOSCOPY;  Service: Endoscopy;  Laterality: N/A;  . EYE SURGERY    . GALLBLADDER SURGERY  2002  . JOINT REPLACEMENT Right 2018  . KNEE ARTHROSCOPY     left   . ROTATOR CUFF REPAIR  2001   left   . TEE WITHOUT CARDIOVERSION N/A 04/26/2018   Procedure: TRANSESOPHAGEAL ECHOCARDIOGRAM (TEE);  Surgeon: Nelva Bush, MD;  Location: [facility] ORS;  Service: Cardiovascular;  Laterality: N/A;  . TONSILLECTOMY    . 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 Heart attack Brother    Social History   Socioeconomic History  . Marital status: Married    Spouse name: Not on file  . Number of children: 1  . Years of education: Not on file  . Highest education level: 6th grade  Occupational History  . Occupation: retired  Scientific laboratory technician  . Financial resource strain: Not hard at all  . Food insecurity     Worry: Never true    Inability: Never true  . Transportation needs    Medical: No    Non-medical: No  Tobacco Use  . Smoking status: Former Smoker    Packs/day: 2.00    Years: 20.00    Pack years: 40.00    Types: Cigarettes    Quit date: 12/26/1989    Years since quitting: 29.8  . Smokeless tobacco: Never Used  Substance and Sexual Activity  . Alcohol use: No  . Drug use: No  . Sexual activity: Not on file  Lifestyle  . Physical activity    Days per week: 0 days    Minutes per session: 0 min  . Stress: Not at all  Relationships  . Social Herbalist on phone:  Patient refused    Gets together: Patient refused    Attends religious service: Patient refused    Active member of club or organization: Patient refused    Attends meetings of clubs or organizations: Patient refused    Relationship status: Patient refused  Other Topics Concern  . Not on file  Social History Narrative  . Not on file    Outpatient Encounter Medications as of 10/31/2019  Medication Sig  . aluminum chloride (DRYSOL) 20 % external solution Apply topically at bedtime.  [name] amiodarone (PACERONE) 200 MG tablet TAKE 1 TABLET BY MOUTH EVERY DAY  . aspirin 81 MG tablet Take 81 mg by mouth daily.  . cholecalciferol (VITAMIN D3) 25 MCG (1000 UT) tablet Take 1,000 Units by mouth daily.  . cyclobenzaprine (FLEXERIL) 10 MG tablet TAKE 1 TABLET BY MOUTH 3 TIMES A DAY  . docusate sodium (COLACE) 250 MG capsule Take by mouth.  Mariane Baumgarten Sodium (STOOL SOFTENER LAXATIVE PO) Take by mouth.  . dutasteride (AVODART) 0.5 MG capsule TAKE 1 CAPSULE DAILY  . esomeprazole (NEXIUM) 40 MG capsule TAKE 1 CAPSULE TWICE DAILY  . famotidine (PEPCID) 20 MG tablet Take 1 tablet (20 mg total) by mouth daily.  . fexofenadine (ALLEGRA) 180 MG tablet Take 180 mg by mouth daily.    . furosemide (LASIX) 40 MG tablet TAKE 1 TABLET (40 MG) BY MOUTH ONCE DAILY, YOU MAY TAKE 1 EXTRA TABLET (40 MG) AFTER LUNCH AS NEEDED FOR LEG  SWELLING/ ABDOMINAL TIGHTNESS  . gabapentin (NEURONTIN) 100 MG capsule TAKE 1 CAPSULE BY MOUTH TWICE A DAY  . glucose blood test strip Use with meter twice daily  . hydrochlorothiazide (HYDRODIURIL) 12.5 MG tablet TAKE 2 TABLETS (25 MG TOTAL) BY MOUTH AT BEDTIME.  . insulin aspart (NOVOLOG FLEXPEN) 100 UNIT/ML FlexPen Inject 12 Units into the skin 3 (three) times daily with meals. (Patient taking differently: Inject 14 Units into the skin 3 (three) times daily with meals. )  . Insulin Pen Needle (PEN NEEDLES) 31G X 5 MM MISC 1 each by Does not apply route daily. PATIENT NEEDS NOVA TWIST NEEDLES 5 MM  DX E11.9  . JANUVIA 100 MG tablet TAKE 1 TABLET DAILY  . LANTUS SOLOSTAR 100 UNIT/ML Solostar Pen Inject 35 Units into the skin 2 (two) times daily. (Patient taking differently: Inject 38 Units into the skin 2 (two) times daily. )  . liraglutide (VICTOZA) 18 MG/3ML SOPN INJECT 0.3ML (=1.8MG )      SUBCUTANEOUSLY DAILY  . magnesium oxide (MAG-OX) 400 MG tablet TAKE 1 TABLET (400 MG TOTAL) BY MOUTH 2 (TWO) TIMES DAILY.  . metoprolol tartrate (LOPRESSOR) 100 MG tablet TAKE 1 TABLET (100 MG TOTAL) BY MOUTH 2 (TWO) TIMES DAILY.  . Multiple Vitamins-Minerals (CENTRUM SILVER 50+MEN) TABS Take by mouth daily.  [name] NOVOTWIST 32G X 5 MM MISC USE 6 TIMES A DAY DX E11.9  . Omega-3 Fatty Acids (FISH OIL) 1200 MG CAPS Take 1,200 mg by mouth 2 (two) times daily.   [name] oxyCODONE (OXYCONTIN) 40 mg 12 hr tablet Take 1 tablet (40 mg total) by mouth every 8 (eight) hours as needed.  . pregabalin (LYRICA) 200 MG capsule Take 1 capsule (200 mg total) by mouth 3 (three) times daily.  . sucralfate (CARAFATE) 1 g tablet TAKE 1 TABLET BY MOUTH 4 (FOUR) TIMES DAILY - WITH MEALS AND AT BEDTIME.  [name] sulfamethoxazole-trimethoprim (BACTRIM) 400-80 MG tablet Take 1 tablet by mouth 2 (two) times daily.  [name] testosterone cypionate (DEPOTESTOSTERONE CYPIONATE) 200  MG/ML injection Inject 200 mg into the muscle every 21 ( twenty-one) days.   .  Vitamin D, Ergocalciferol, (DRISDOL) 1.25 MG (50000 UT) CAPS capsule Take by mouth.  . [DISCONTINUED] furosemide (LASIX) 40 MG tablet TAKE 1 TABLET (40 MG) BY MOUTH ONCE DAILY, YOU MAY TAKE 1 EXTRA TABLET (40 MG) AFTER LUNCH AS NEEDED FOR LEG SWELLING/ ABDOMINAL TIGHTNESS   No facility-administered encounter medications on file as of 10/31/2019.     Activities of Daily Living In your present state of health, do you have any difficulty performing the following activities: 10/31/2019  Hearing? N  Vision? Y  Comment Is blind in the right eye.  Difficulty concentrating or making decisions? N  Walking or climbing stairs? N  Dressing or bathing? N  Doing errands, shopping? N  Preparing Food and eating ? N  Using the Toilet? N  In the past six months, have you accidently leaked urine? N  Do you have problems with loss of bowel control? N  Managing your Medications? N  Managing your Finances? N  Housekeeping or managing your Housekeeping? N  Some recent data might be hidden    Patient Care Team: Jerrol Banana., MD as PCP - General (Unknown Physician Specialty) Minna Merritts, MD as Consulting Physician (Cardiology) Leandrew Koyanagi, MD as Referring Physician (Ophthalmology) Gaynelle Arabian, MD as Consulting Physician (Orthopedic Surgery) Landis Martins, DPM as Consulting Physician (Podiatry) Royston Cowper, MD as Consulting Physician (Urology) Cathi Roan, [facility] (Pharmacist)   Assessment:   This is a routine wellness examination for Kiyen.  Exercise Activities and Dietary recommendations Current Exercise Habits: The patient does not participate in regular exercise at present, Exercise limited by: orthopedic condition(s)  Goals      Patient Stated   . Diabetes (pt-stated)     Current Barriers:  [name] Knowledge Deficits related to basic Diabetes pathophysiology and self care/management . Knowledge Deficits related to medications used for management of diabetes   Clinical Pharmacist Goal(s):  Over the next 30 days, patient will demonstrate improved adherence to prescribed treatment plan for diabetes self care/management as evidenced by:  [name] Recorded morning blood sugar <200 mg/dL . adherence to ADA/ carb modified diet  Interventions:  . Provided education to patient about basic DM disease process o One month trial of Vitamin B12 for neuropathy . Recommendation to Dr. [name]:  o Discontinue Januvia (DPP4 inhibitor) as not much affect when also taking a GLP1 agonist (Victoza)  o Patient still showing uncontrolled diabetes based on self reported BG, consider adding Jardiance or increasing Lantus o Statin therapy? Last lipid panel in EMR in June 2019 o Possible candidate for CGM  Patient Self Care Activities:  . Self administers insulin as prescribed . Self administers injectable DM medication (Victoza) as prescribed . Checks blood sugars as prescribed and utilize hyper and hypoglycemia protocol as needed . Adheres to prescribed ADA/carb modified  Please see past updates related to this goal by clicking on the "Past Updates" button in the selected goal        Other   . Cut out extra servings     Recommend cutting out extra servings at night time.     . Cut out extra servings     Continue cutting out any extra servings after 8 pm and cutting out all sodas in diet.     [name] LIFESTYLE - DECREASE FALLS RISK     Recommend to remove any items from the home that may cause slips or  trips.       Fall Risk Fall Risk  10/31/2019 10/31/2019 02/05/2019 11/13/2018 10/05/2018  Falls in the past year? 1 1 0 0 Yes  Comment - - - [place]i Telephone Survey: data to providers prior to load -  Number falls in past yr: 1 1 - - 2 or more  Comment - - - - -  Injury with Fall? 0 0 - - Yes  Comment - - - - Had to have right knee sx.  Risk Factor Category  - - - - -  Risk for fall due to : - Impaired mobility - - -  Follow up Falls prevention discussed Falls  prevention discussed - - Falls prevention discussed   FALL RISK PREVENTION PERTAINING TO THE HOME:  Any stairs in or around the home? No  If so, are there any without handrails? N/A  Home free of loose throw rugs in walkways, pet beds, electrical cords, etc? Yes  Adequate lighting in your home to reduce risk of falls? Yes   ASSISTIVE DEVICES UTILIZED TO PREVENT FALLS:  Life alert? No  Use of a cane, walker or w/c? Yes  Grab bars in the bathroom? No  Shower chair or bench in shower? No  Elevated toilet seat or a handicapped toilet? Yes    TIMED UP AND GO:  Was the test performed? No .    Depression Screen PHQ 2/9 Scores 10/31/2019 02/05/2019 10/05/2018 08/31/2017  PHQ - 2 Score 3 0 1 1  PHQ- 9 Score 10 - - -    Cognitive Function: Declined today.         Immunization History  Administered Date(s) Administered  . Influenza, High Dose Seasonal PF 09/18/2015, 10/13/2016, 11/03/2017, 10/05/2018, 09/12/2019  . Influenza-Unspecified 08/27/2014  . Pneumococcal Conjugate-13 07/10/2014, 08/31/2017  . Pneumococcal Polysaccharide-23 12/02/1999, 12/30/2011, 10/05/2018  . Td 03/31/2005  . Tdap 08/31/2017  . Zoster 06/30/2014    Qualifies for Shingles Vaccine? Yes  Zostavax completed 06/30/14. Due for Shingrix. Education has been provided regarding the importance of this vaccine. Pt has been advised to call insurance company to determine out of pocket expense. Advised may also receive vaccine at local pharmacy or Health Dept. Verbalized acceptance and understanding.  Tdap: Up to date  Flu Vaccine: Up to date  Pneumococcal Vaccine: Completed series  Screening Tests Health Maintenance  Topic Date Due  . URINE MICROALBUMIN  09/14/2018  . OPHTHALMOLOGY EXAM  01/13/2020  . HEMOGLOBIN A1C  03/11/2020  . FOOT EXAM  10/22/2020  . COLONOSCOPY  01/16/2023  . TETANUS/TDAP  09/01/2027  . INFLUENZA VACCINE  Completed  . Hepatitis C Screening  Completed  . PNA vac Low Risk Adult   Completed   Cancer Screenings:  Colorectal Screening: Completed 01/16/18. Repeat every 5 years.  Lung Cancer Screening: (Low Dose CT Chest recommended if Age 1-80 years, 30 pack-year currently smoking OR have quit w/in 15years.) does not qualify.   Additional Screening:  Hepatitis C Screening: Up to date  Dental Screening: Recommended annual dental exams for proper oral hygiene  Community Resource Referral:  CRR required this visit?  No        Plan:  I have personally reviewed and addressed the Medicare Annual Wellness questionnaire and have noted the following in the patient's chart:  A. Medical and social history B. Use of alcohol, tobacco or illicit drugs  C. Current medications and supplements D. Functional ability and status E.  Nutritional status F.  Physical activity G. Advance directives H. List  of other physicians I.  Hospitalizations, surgeries, and ER visits in previous 12 months J.  [place] such as hearing and vision if needed, cognitive and depression L. Referrals and appointments   In addition, I have reviewed and discussed with patient certain preventive protocols, quality metrics, and best practice recommendations. A written personalized care plan for preventive services as well as general preventive health recommendations were provided to patient.   Glendora Score, [place]  [phone] Nurse Health Advisor  Nurse Notes: Pt needs a urine check at next in office visit.

## 2019-10-30 NOTE — Telephone Encounter (Signed)
Pt's wife contacted office for refill request on the following medications:  oxyCODONE (OXYCONTIN) 40 mg 12 hr tablet  [facility] mail order Please advise. Thanks TNP

## 2019-10-31 ENCOUNTER — Other Ambulatory Visit: Payer: Self-pay

## 2019-10-31 ENCOUNTER — Ambulatory Visit (INDEPENDENT_AMBULATORY_CARE_PROVIDER_SITE_OTHER): Payer: Medicare Other

## 2019-10-31 DIAGNOSIS — Z0000 Encounter for general adult medical examination without abnormal findings: Secondary | ICD-10-CM

## 2019-10-31 DIAGNOSIS — E291 Testicular hypofunction: Secondary | ICD-10-CM | POA: Diagnosis not present

## 2019-10-31 NOTE — Patient Instructions (Addendum)
[name] , Thank you for taking time to come for your Medicare Wellness Visit. I appreciate your ongoing commitment to your health goals. Please review the following plan we discussed and let me know if I can assist you in the future.   Screening recommendations/referrals: Colonoscopy: Up to date, due 12/2022 Recommended yearly ophthalmology/optometry visit for glaucoma screening and checkup Recommended yearly dental visit for hygiene and checkup  Vaccinations: Influenza vaccine: Up to date Pneumococcal vaccine: Completed series Tdap vaccine: Up to date Shingles vaccine: Pt declines today.     Advanced directives: Please bring a copy of your POA (Power of Attorney) and/or Living Will to your next appointment.   Conditions/risks identified: Fall risk preventatives discussed with you today.   Next appointment: 01/14/19 @ 1:40 PM with Dr [name]. Declined scheduling an AWV for 2021 at this time.   Preventive Care 32 Years and Older, Male Preventive care refers to lifestyle choices and visits with your health care provider that can promote health and wellness. What does preventive care include?  A yearly physical exam. This is also called an annual well check.  Dental exams once or twice a year.  Routine eye exams. Ask your health care provider how often you should have your eyes checked.  Personal lifestyle choices, including:  Daily care of your teeth and gums.  Regular physical activity.  Eating a healthy diet.  Avoiding tobacco and drug use.  Limiting alcohol use.  Practicing safe sex.  Taking low doses of aspirin every day.  Taking vitamin and mineral supplements as recommended by your health care provider. What happens during an annual well check? The services and screenings done by your health care provider during your annual well check will depend on your age, overall health, lifestyle risk factors, and family history of disease. Counseling  Your health care  provider may ask you questions about your:  Alcohol use.  Tobacco use.  Drug use.  Emotional well-being.  Home and relationship well-being.  Sexual activity.  Eating habits.  History of falls.  Memory and ability to understand (cognition).  Work and work Statistician. Screening  You may have the following tests or measurements:  Height, weight, and BMI.  Blood pressure.  Lipid and cholesterol levels. These may be checked every 5 years, or more frequently if you are over 43 years old.  Skin check.  Lung cancer screening. You may have this screening every year starting at age 70 if you have a 30-pack-year history of smoking and currently smoke or have quit within the past 15 years.  Fecal occult blood test (FOBT) of the stool. You may have this test every year starting at age 31.  Flexible sigmoidoscopy or colonoscopy. You may have a sigmoidoscopy every 5 years or a colonoscopy every 10 years starting at age 39.  Prostate cancer screening. Recommendations will vary depending on your family history and other risks.  Hepatitis C blood test.  Hepatitis B blood test.  Sexually transmitted disease (STD) testing.  Diabetes screening. This is done by checking your blood sugar (glucose) after you have not eaten for a while (fasting). You may have this done every 1-3 years.  Abdominal aortic aneurysm (AAA) screening. You may need this if you are a current or former smoker.  Osteoporosis. You may be screened starting at age 78 if you are at high risk. Talk with your health care provider about your test results, treatment options, and if necessary, the need for more tests. Vaccines  Your health  care provider may recommend certain vaccines, such as:  Influenza vaccine. This is recommended every year.  Tetanus, diphtheria, and acellular pertussis (Tdap, Td) vaccine. You may need a Td booster every 10 years.  Zoster vaccine. You may need this after age 66.  Pneumococcal  13-valent conjugate (PCV13) vaccine. One dose is recommended after age 28.  Pneumococcal polysaccharide (PPSV23) vaccine. One dose is recommended after age 60. Talk to your health care provider about which screenings and vaccines you need and how often you need them. This information is not intended to replace advice given to you by your health care provider. Make sure you discuss any questions you have with your health care provider. Document Released: 01/09/2016 Document Revised: 09/01/2016 Document Reviewed: 10/14/2015 Elsevier Interactive Patient Education  2017 [place] [employer] Prevention in the Home Falls can cause injuries. They can happen to people of all ages. There are many things you can do to make your home safe and to help prevent falls. What can I do on the outside of my home?  Regularly fix the edges of walkways and driveways and fix any cracks.  Remove anything that might make you trip as you walk through a door, such as a raised step or threshold.  Trim any bushes or trees on the path to your home.  Use bright outdoor lighting.  Clear any walking paths of anything that might make someone trip, such as rocks or tools.  Regularly check to see if handrails are loose or broken. Make sure that both sides of any steps have handrails.  Any raised decks and porches should have guardrails on the edges.  Have any leaves, snow, or ice cleared regularly.  Use sand or salt on walking paths during winter.  Clean up any spills in your garage right away. This includes oil or grease spills. What can I do in the bathroom?  Use night lights.  Install grab bars by the toilet and in the tub and shower. Do not use towel bars as grab bars.  Use non-skid mats or decals in the tub or shower.  If you need to sit down in the shower, use a plastic, non-slip stool.  Keep the floor dry. Clean up any water that spills on the floor as soon as it happens.  Remove soap buildup in the  tub or shower regularly.  Attach bath mats securely with double-sided non-slip rug tape.  Do not have throw rugs and other things on the floor that can make you trip. What can I do in the bedroom?  Use night lights.  Make sure that you have a light by your bed that is easy to reach.  Do not use any sheets or blankets that are too big for your bed. They should not hang down onto the floor.  Have a firm chair that has side arms. You can use this for support while you get dressed.  Do not have throw rugs and other things on the floor that can make you trip. What can I do in the kitchen?  Clean up any spills right away.  Avoid walking on wet floors.  Keep items that you use a lot in easy-to-reach places.  If you need to reach something above you, use a strong step stool that has a grab bar.  Keep electrical cords out of the way.  Do not use floor polish or wax that makes floors slippery. If you must use wax, use non-skid floor wax.  Do not have  throw rugs and other things on the floor that can make you trip. What can I do with my stairs?  Do not leave any items on the stairs.  Make sure that there are handrails on both sides of the stairs and use them. Fix handrails that are broken or loose. Make sure that handrails are as long as the stairways.  Check any carpeting to make sure that it is firmly attached to the stairs. Fix any carpet that is loose or worn.  Avoid having throw rugs at the top or bottom of the stairs. If you do have throw rugs, attach them to the floor with carpet tape.  Make sure that you have a light switch at the top of the stairs and the bottom of the stairs. If you do not have them, ask someone to add them for you. What else can I do to help prevent falls?  Wear shoes that:  Do not have high heels.  Have rubber bottoms.  Are comfortable and fit you well.  Are closed at the toe. Do not wear sandals.  If you use a stepladder:  Make sure that it  is fully opened. Do not climb a closed stepladder.  Make sure that both sides of the stepladder are locked into place.  Ask someone to hold it for you, if possible.  Clearly mark and make sure that you can see:  Any grab bars or handrails.  First and last steps.  Where the edge of each step is.  Use tools that help you move around (mobility aids) if they are needed. These include:  Canes.  Walkers.  Scooters.  Crutches.  Turn on the lights when you go into a dark area. Replace any light bulbs as soon as they burn out.  Set up your furniture so you have a clear path. Avoid moving your furniture around.  If any of your floors are uneven, fix them.  If there are any pets around you, be aware of where they are.  Review your medicines with your doctor. Some medicines can make you feel dizzy. This can increase your chance of falling. Ask your doctor what other things that you can do to help prevent falls. This information is not intended to replace advice given to you by your health care provider. Make sure you discuss any questions you have with your health care provider. Document Released: 10/09/2009 Document Revised: 05/20/2016 Document Reviewed: 01/17/2015 Elsevier Interactive Patient Education  2017 [employer].

## 2019-11-05 ENCOUNTER — Other Ambulatory Visit: Payer: Self-pay | Admitting: Family Medicine

## 2019-11-05 DIAGNOSIS — M48061 Spinal stenosis, lumbar region without neurogenic claudication: Secondary | ICD-10-CM

## 2019-11-05 DIAGNOSIS — IMO0002 Reserved for concepts with insufficient information to code with codable children: Secondary | ICD-10-CM

## 2019-11-05 DIAGNOSIS — E1165 Type 2 diabetes mellitus with hyperglycemia: Secondary | ICD-10-CM

## 2019-11-05 MED ORDER — OXYCODONE HCL ER 40 MG PO T12A: 40.0000 mg | EXTENDED_RELEASE_TABLET | Freq: Three times a day (TID) | ORAL | 0 refills | Status: DC | PRN

## 2019-11-05 NOTE — Telephone Encounter (Signed)
I accidentally sent it to both

## 2019-11-05 NOTE — Telephone Encounter (Signed)
[facility] to [facility].   He takes 40 mg. Three a day.  The prior auth. runs out in a few days so he needs this before it expires please.   Send to [facility].

## 2019-11-05 NOTE — Telephone Encounter (Signed)
Pharmacy was updated. Please resend. Thanks!

## 2019-11-05 NOTE — Telephone Encounter (Signed)
[facility]

## 2019-11-05 NOTE — Telephone Encounter (Signed)
Sent!

## 2019-11-07 ENCOUNTER — Other Ambulatory Visit: Payer: Self-pay | Admitting: Family Medicine

## 2019-11-19 ENCOUNTER — Other Ambulatory Visit: Payer: Self-pay | Admitting: Family Medicine

## 2019-11-19 DIAGNOSIS — IMO0002 Reserved for concepts with insufficient information to code with codable children: Secondary | ICD-10-CM

## 2019-11-19 DIAGNOSIS — E1165 Type 2 diabetes mellitus with hyperglycemia: Secondary | ICD-10-CM

## 2019-11-21 DIAGNOSIS — R102 Pelvic and perineal pain: Secondary | ICD-10-CM | POA: Diagnosis not present

## 2019-11-21 DIAGNOSIS — M5489 Other dorsalgia: Secondary | ICD-10-CM | POA: Diagnosis not present

## 2019-11-21 DIAGNOSIS — E291 Testicular hypofunction: Secondary | ICD-10-CM | POA: Diagnosis not present

## 2019-11-21 DIAGNOSIS — N414 Granulomatous prostatitis: Secondary | ICD-10-CM | POA: Diagnosis not present

## 2019-12-19 DIAGNOSIS — E291 Testicular hypofunction: Secondary | ICD-10-CM | POA: Diagnosis not present

## 2020-01-05 ENCOUNTER — Other Ambulatory Visit: Payer: Self-pay | Admitting: Family Medicine

## 2020-01-15 ENCOUNTER — Ambulatory Visit: Payer: Self-pay | Admitting: Family Medicine

## 2020-01-15 DIAGNOSIS — N5201 Erectile dysfunction due to arterial insufficiency: Secondary | ICD-10-CM | POA: Diagnosis not present

## 2020-01-15 DIAGNOSIS — E291 Testicular hypofunction: Secondary | ICD-10-CM | POA: Diagnosis not present

## 2020-01-15 DIAGNOSIS — Z79899 Other long term (current) drug therapy: Secondary | ICD-10-CM | POA: Diagnosis not present

## 2020-01-16 ENCOUNTER — Ambulatory Visit: Payer: Medicare Other | Admitting: Family Medicine

## 2020-01-16 DIAGNOSIS — E291 Testicular hypofunction: Secondary | ICD-10-CM | POA: Diagnosis not present

## 2020-01-16 DIAGNOSIS — N401 Enlarged prostate with lower urinary tract symptoms: Secondary | ICD-10-CM | POA: Diagnosis not present

## 2020-01-16 DIAGNOSIS — Z79899 Other long term (current) drug therapy: Secondary | ICD-10-CM | POA: Diagnosis not present

## 2020-01-22 ENCOUNTER — Other Ambulatory Visit: Payer: Self-pay | Admitting: Family Medicine

## 2020-01-23 ENCOUNTER — Other Ambulatory Visit: Payer: Self-pay | Admitting: Family Medicine

## 2020-01-29 ENCOUNTER — Ambulatory Visit: Payer: Medicare Other | Admitting: Sports Medicine

## 2020-01-31 ENCOUNTER — Other Ambulatory Visit: Payer: Self-pay | Admitting: Family Medicine

## 2020-01-31 DIAGNOSIS — M48061 Spinal stenosis, lumbar region without neurogenic claudication: Secondary | ICD-10-CM

## 2020-01-31 MED ORDER — ESOMEPRAZOLE MAGNESIUM 40 MG PO CPDR: 40.0000 mg | DELAYED_RELEASE_CAPSULE | Freq: Two times a day (BID) | ORAL | 3 refills | Status: DC

## 2020-01-31 NOTE — Telephone Encounter (Signed)
Requested Prescriptions  Pending Prescriptions Disp Refills  . esomeprazole (NEXIUM) 40 MG capsule 90 capsule 3    Sig: Take 1 capsule (40 mg total) by mouth 2 (two) times daily.     Gastroenterology: Proton Pump Inhibitors Passed - 01/31/2020  1:16 PM      Passed - Valid encounter within last 12 months    Recent Outpatient Visits          4 months ago Diabetes mellitus type 2, uncontrolled, with complications [facility])   [facility] Jerrol Banana., MD   7 months ago Diabetes mellitus type 2, uncontrolled, with complications [facility])   [facility] Jerrol Banana., MD   12 months ago Diabetes mellitus type 2, uncontrolled, with complications [facility])   [facility] Jerrol Banana., MD   1 year ago Diabetes mellitus type 2, uncontrolled, with complications [facility])   [facility] Jerrol Banana., MD   1 year ago Neuropathy   [facility] Jerrol Banana., MD      Future Appointments            In 1 week Jerrol Banana., MD [facility]           . oxyCODONE (OXYCONTIN) 40 mg 12 hr tablet 270 tablet 0    Sig: Take 1 tablet (40 mg total) by mouth every 8 (eight) hours as needed.     Not Delegated - Analgesics:  Opioid Agonists Failed - 01/31/2020  1:16 PM      Failed - This refill cannot be delegated      Failed - Urine Drug Screen completed in last 360 days.      Passed - Valid encounter within last 6 months    Recent Outpatient Visits          4 months ago Diabetes mellitus type 2, uncontrolled, with complications [facility])   [facility] Jerrol Banana., MD   7 months ago Diabetes mellitus type 2, uncontrolled, with complications [facility])   [facility] Jerrol Banana., MD   12 months ago Diabetes mellitus type 2, uncontrolled, with complications [facility])   [facility] Jerrol Banana., MD   1 year ago  Diabetes mellitus type 2, uncontrolled, with complications [facility])   [facility] Jerrol Banana., MD   1 year ago Neuropathy   [facility] Jerrol Banana., MD      Future Appointments            In 1 week Jerrol Banana., MD [facility], [facility]

## 2020-01-31 NOTE — Telephone Encounter (Signed)
Medication Refill - Medication: oxyCODONE (OXYCONTIN) 40 mg 12 hr tablet 90 day supply esomeprazole (NEXIUM) 40 MG capsule 90 day supply  Has the patient contacted their pharmacy? Yes.   (Agent: If no, request that the patient contact the pharmacy for the refill.) (Agent: If yes, when and what did the pharmacy advise?)  Preferred Pharmacy (with phone number or street name):  [facility] [place] [place] [facility] [address] [place] [address]  Phone: [phone] Fax: [phone]     Agent: Please be advised that RX refills may take up to 3 business days. We ask that you follow-up with your pharmacy.

## 2020-01-31 NOTE — Telephone Encounter (Signed)
Requested medication (s) are due for refill today: yes  Requested medication (s) are on the active medication list: yes  Last refill:  09/24/2019  Future visit scheduled: yes  Notes to clinic:  not delegated    Requested Prescriptions  Pending Prescriptions Disp Refills   oxyCODONE (OXYCONTIN) 40 mg 12 hr tablet 270 tablet 0    Sig: Take 1 tablet (40 mg total) by mouth every 8 (eight) hours as needed.      Not Delegated - Analgesics:  Opioid Agonists Failed - 01/31/2020  1:16 PM      Failed - This refill cannot be delegated      Failed - Urine Drug Screen completed in last 360 days.      Passed - Valid encounter within last 6 months    Recent Outpatient Visits           4 months ago Diabetes mellitus type 2, uncontrolled, with complications [facility])   [facility] Jerrol Banana., MD   7 months ago Diabetes mellitus type 2, uncontrolled, with complications [facility])   [facility] Jerrol Banana., MD   12 months ago Diabetes mellitus type 2, uncontrolled, with complications [facility])   [facility] Jerrol Banana., MD   1 year ago Diabetes mellitus type 2, uncontrolled, with complications [facility])   [facility] Jerrol Banana., MD   1 year ago Neuropathy   [facility] Jerrol Banana., MD       Future Appointments             In 1 week Jerrol Banana., MD [facility]             Signed Prescriptions Disp Refills   esomeprazole (NEXIUM) 40 MG capsule 90 capsule 3    Sig: Take 1 capsule (40 mg total) by mouth 2 (two) times daily.      Gastroenterology: Proton Pump Inhibitors Passed - 01/31/2020  1:16 PM      Passed - Valid encounter within last 12 months    Recent Outpatient Visits           4 months ago Diabetes mellitus type 2, uncontrolled, with complications [facility])   [facility] Jerrol Banana., MD   7 months ago  Diabetes mellitus type 2, uncontrolled, with complications [facility])   [facility] Jerrol Banana., MD   12 months ago Diabetes mellitus type 2, uncontrolled, with complications [facility])   [facility] Jerrol Banana., MD   1 year ago Diabetes mellitus type 2, uncontrolled, with complications [facility])   [facility] Jerrol Banana., MD   1 year ago Neuropathy   [facility] Jerrol Banana., MD       Future Appointments             In 1 week Jerrol Banana., MD [facility]

## 2020-02-01 MED ORDER — OXYCODONE HCL ER 40 MG PO T12A: 40.0000 mg | EXTENDED_RELEASE_TABLET | Freq: Three times a day (TID) | ORAL | 0 refills | Status: DC | PRN

## 2020-02-04 ENCOUNTER — Ambulatory Visit: Payer: Medicare Other | Attending: Internal Medicine

## 2020-02-04 DIAGNOSIS — Z23 Encounter for immunization: Secondary | ICD-10-CM | POA: Insufficient documentation

## 2020-02-04 NOTE — Progress Notes (Signed)
   Covid-19 Vaccination Clinic  Name:  Jerry Parsons    MRN: [id number] DOB: 1951/10/28  02/04/2020  Jerry Parsons was observed post Covid-19 immunization for 15 minutes without incidence. He was provided with Vaccine Information Sheet and instruction to access the V-Safe system.   Jerry Parsons was instructed to call 911 with any severe reactions post vaccine: [name] Difficulty breathing  . Swelling of your face and throat  . A fast heartbeat  . A bad rash all over your body  . Dizziness and weakness    Immunizations Administered    Name Date Dose VIS Date Route   Pfizer COVID-19 Vaccine 02/04/2020 11:56 AM 0.3 mL 12/07/2019 Intramuscular   Manufacturer: [place]   Lot: [id number]   [place]: [id number]

## 2020-02-07 ENCOUNTER — Telehealth: Payer: Self-pay | Admitting: Family Medicine

## 2020-02-07 DIAGNOSIS — M48061 Spinal stenosis, lumbar region without neurogenic claudication: Secondary | ICD-10-CM

## 2020-02-07 MED ORDER — OXYCODONE HCL ER 40 MG PO T12A: 40.0000 mg | EXTENDED_RELEASE_TABLET | Freq: Three times a day (TID) | ORAL | 0 refills | Status: DC | PRN

## 2020-02-07 NOTE — Telephone Encounter (Signed)
Pt's spouse called back in to follow up on PA completion and Rx being sent to  [place] [place] to [address] [place] [address]  Phone: [phone] Fax: [phone]  Not a 24 hour pharmacy; exact hours not known.     Spouse says that Rx was sent to local pharmacy also. They need Rx  Via mail order but is requiring approval.. Mail order would ike for office to call them to provide info for approval.  Please assist.

## 2020-02-07 NOTE — Telephone Encounter (Signed)
Pts wife called stating that the pts oxyCODONE (OXYCONTIN) 40 mg 12 hr tablet is needing a PA in order to be refilled. PTs wife states that the pharmacy will only keep it for 2 more days. Please advise.    [facility] [place] [place] [facility] [address] [place] [address]  Phone: [phone] Fax: [phone]  Not a 24 hour pharmacy; exact hours not known.

## 2020-02-07 NOTE — Telephone Encounter (Signed)
Please advise 

## 2020-02-08 NOTE — Progress Notes (Signed)
[name]    DOB: 30-Apr-1951   69 y.o.   MRN: [id number] Visit Date: 02/11/2020  Today's Provider: [name], MD   Chief Complaint  Patient presents with  . Follow-up  . Diabetes  . Atrial Fibrillation  . Hypertension   Subjective:     HPI   Diabetes mellitus type 2, uncontrolled, with complications ([place]) From 09/12/2019-HgB A1C--8.2. Paient advised to start checking BS at home.  Paroxysmal atrial fibrillation (HCC) From 09/12/2019-no changes were made.  HYPERTENSION, BENIGN From 09/12/2019-on metoprolol.  OSA on CPAP From 09/12/2019  Hypogonadism From 09/12/2019-no changes were made.   Allergies  Allergen Reactions  . Carisoprodol Itching     Current Outpatient Medications:  .  amiodarone (PACERONE) 200 MG tablet, TAKE 1 TABLET BY MOUTH EVERY DAY, Disp: 90 tablet, Rfl: 2 .  aspirin 81 MG tablet, Take 81 mg by mouth daily., Disp: , Rfl:  .  cholecalciferol (VITAMIN D3) 25 MCG (1000 UT) tablet, Take 1,000 Units by mouth daily., Disp: , Rfl:  .  cyclobenzaprine (FLEXERIL) 10 MG tablet, TAKE 1 TABLET BY MOUTH 3 TIMES A DAY, Disp: 90 tablet, Rfl: 12 .  docusate sodium (COLACE) 250 MG capsule, Take by mouth., Disp: , Rfl:  .  Docusate Sodium (STOOL SOFTENER LAXATIVE PO), Take by mouth., Disp: , Rfl:  .  dutasteride (AVODART) 0.5 MG capsule, TAKE 1 CAPSULE DAILY, Disp: 90 capsule, Rfl: 3 .  esomeprazole (NEXIUM) 40 MG capsule, Take 1 capsule (40 mg total) by mouth 2 (two) times daily., Disp: 90 capsule, Rfl: 3 .  fexofenadine (ALLEGRA) 180 MG tablet, Take 180 mg by mouth daily.  , Disp: , Rfl:  .  furosemide (LASIX) 40 MG tablet, TAKE 1 TABLET (40 MG) BY MOUTH ONCE DAILY, YOU MAY TAKE 1 EXTRA TABLET (40 MG) AFTER LUNCH AS NEEDED FOR LEG SWELLING/ ABDOMINAL TIGHTNESS, Disp: 180 tablet, Rfl: 1 .  gabapentin (NEURONTIN) 100 MG capsule, TAKE 1 CAPSULE BY MOUTH TWICE A DAY, Disp: 180 capsule, Rfl: 2 .  hydrochlorothiazide (HYDRODIURIL)  12.5 MG tablet, TAKE 2 TABLETS (25 MG TOTAL) BY MOUTH AT BEDTIME., Disp: 180 tablet, Rfl: 1 .  insulin aspart (NOVOLOG FLEXPEN) 100 UNIT/ML FlexPen, Inject 12 Units into the skin 3 (three) times daily with meals. (Patient taking differently: Inject 14 Units into the skin 3 (three) times daily with meals. ), Disp: 45 mL, Rfl: 3 .  Insulin Pen Needle (PEN NEEDLES) 31G X 5 MM MISC, 1 each by Does not apply route daily. PATIENT NEEDS NOVA TWIST NEEDLES 5 MM  DX E11.9, Disp: 200 each, Rfl: 3 .  JANUVIA 100 MG tablet, TAKE 1 TABLET DAILY, Disp: 90 tablet, Rfl: 3 .  LANTUS SOLOSTAR 100 UNIT/ML Solostar Pen, INJECT 35 UNITS INTO THE   SKIN TWO TIMES A DAY, Disp: 45 mL, Rfl: 3 .  liraglutide (VICTOZA) 18 MG/3ML SOPN, INJECT 0.3ML (=1.8MG )      SUBCUTANEOUSLY DAILY, Disp: 27 mL, Rfl: 3 .  magnesium oxide (MAG-OX) 400 MG tablet, TAKE 1 TABLET (400 MG TOTAL) BY MOUTH 2 (TWO) TIMES DAILY., Disp: 180 tablet, Rfl: 3 .  metoprolol tartrate (LOPRESSOR) 100 MG tablet, TAKE 1 TABLET (100 MG TOTAL) BY MOUTH 2 (TWO) TIMES DAILY., Disp: 180 tablet, Rfl: 3 .  NOVOTWIST 32G X 5 MM MISC, USE 6 TIMES A DAY DX E11.9, Disp: 100 each, Rfl: 9 .  Omega-3 Fatty Acids (FISH OIL) 1200 MG CAPS, Take 1,200 mg by mouth  2 (two) times daily. , Disp: , Rfl:  .  ONETOUCH ULTRA test strip, USE WITH METER TWICE DAILY, Disp: 100 strip, Rfl: 3 .  oxyCODONE (OXYCONTIN) 40 mg 12 hr tablet, Take 1 tablet (40 mg total) by mouth every 8 (eight) hours as needed., Disp: 270 tablet, Rfl: 0 .  pregabalin (LYRICA) 200 MG capsule, TAKE 1 CAPSULE 3 TIMES A   DAY, Disp: 270 capsule, Rfl: 1 .  sucralfate (CARAFATE) 1 g tablet, TAKE 1 TABLET BY MOUTH 4 (FOUR) TIMES DAILY - WITH MEALS AND AT BEDTIME., Disp: 360 tablet, Rfl: 1 .  sulfamethoxazole-trimethoprim (BACTRIM) 400-80 MG tablet, Take 1 tablet by mouth 2 (two) times daily., Disp: , Rfl:  .  testosterone cypionate (DEPOTESTOSTERONE CYPIONATE) 200 MG/ML injection, Inject 200 mg into the muscle every 21 (  twenty-one) days. , Disp: , Rfl:  .  aluminum chloride (DRYSOL) 20 % external solution, Apply topically at bedtime. (Patient not taking: Reported on 02/11/2020), Disp: 60 mL, Rfl: 5 .  famotidine (PEPCID) 20 MG tablet, Take 1 tablet (20 mg total) by mouth daily. (Patient not taking: Reported on 02/11/2020), Disp: 30 tablet, Rfl: 12 .  Multiple Vitamins-Minerals (CENTRUM SILVER 50+MEN) TABS, Take by mouth daily., Disp: , Rfl:  .  Vitamin D, Ergocalciferol, (DRISDOL) 1.25 MG (50000 UT) CAPS capsule, Take by mouth., Disp: , Rfl:   Review of Systems  Constitutional: Negative for appetite change, chills and fever.  HENT: Negative.   Eyes: Negative.   Respiratory: Negative for chest tightness, shortness of breath and wheezing.   Cardiovascular: Negative for chest pain and palpitations.  Gastrointestinal: Negative for abdominal pain, nausea and vomiting.  Endocrine: Negative.   Musculoskeletal: Positive for arthralgias and back pain.  Skin: Negative.   Allergic/Immunologic: Negative.   Neurological:       Patient has trouble sitting still.  Looks like restless leg syndrome but he sitting here in the office at 3:00 in the afternoon  Psychiatric/Behavioral: Negative.     Social History   Tobacco Use  . Smoking status: Former Smoker    Packs/day: 2.00    Years: 20.00    Pack years: 40.00    Types: Cigarettes    Quit date: 12/26/1989    Years since quitting: 30.1  . Smokeless tobacco: Never Used  Substance Use Topics  . Alcohol use: No      Objective:   BP 131/68 (BP Location: Right Arm, Patient Position: Sitting, Cuff Size: Large)   Pulse 86   Temp (!) 97.5 F (36.4 C) (Other (Comment))   Resp 18   Ht 5\' 10"  (1.778 m)   Wt 272 lb (123.4 kg)   SpO2 97%   BMI 39.03 kg/m  Vitals:   02/11/20 1437  BP: 131/68  Pulse: 86  Resp: 18  Temp: (!) 97.5 F (36.4 C)  TempSrc: Other (Comment)  SpO2: 97%  Weight: 272 lb (123.4 kg)  Height: 5\' 10"  (1.778 m)  Body mass index is 39.03  kg/m.   Physical Exam Vitals reviewed.  Constitutional:      Appearance: He is well-developed. He is obese.  HENT:     Head: Normocephalic and atraumatic.     Right Ear: External ear normal.     Left Ear: External ear normal.     Nose: Nose normal.  Eyes:     General: No scleral icterus.    Conjunctiva/sclera: Conjunctivae normal.  Neck:     Thyroid: No thyromegaly.  Cardiovascular:     Rate and  Rhythm: Normal rate and regular rhythm.     Heart sounds: Normal heart sounds.  Pulmonary:     Effort: Pulmonary effort is normal.     Breath sounds: Normal breath sounds.  Abdominal:     Palpations: Abdomen is soft.  Musculoskeletal:     Right lower leg: Edema present.     Left lower leg: Edema present.     Comments: He has mild bilateral swelling of both lower extremities with the left measuring 18 inches in the right 17 inches.  He also has tenderness without warmth along the medial portion of the upper left calf.  Do not feel a  cord but this is consistent with a superficial thrombophlebitis.  Skin:    General: Skin is warm and dry.  Neurological:     Mental Status: He is alert and oriented to person, place, and time. Mental status is at baseline.     Comments: He bounces his legs as he sits.  He is having difficulty just sitting still.  Psychiatric:        Mood and Affect: Mood normal.        Behavior: Behavior normal.        Thought Content: Thought content normal.        Judgment: Judgment normal.      No results found for any visits on 02/11/20.     Assessment & Plan     1. Diabetes mellitus type 2, uncontrolled, with complications ([place]) Poorly controlled diabetes.  Stressed he must watch his eating.  He is unable to exercise due to weather, Covid quarantining for which she is paranoid, and his inability to move due to neurologic and musculoskeletal problems.  Need to do urine microalbumin on his next visit. - POCT glycosylated hemoglobin (Hb A1C)  2. Essential  hypertension, benign Controlled  3. Restless leg syndrome Looks like restless leg syndrome but I am not sure.  Other than the movement is neurologic exam is unchanged.  Ropinirole but I think a need to get him to neurology for evaluation. - rOPINIRole (REQUIP) 1 MG tablet; Take 1-2 tablets by mouth at bedtime as needed  Dispense: 60 tablet; Refill: 1 - VAS [place] LOWER EXTREMITY VENOUS (DVT) - Ambulatory referral to Neurology - VAS [place] LOWER EXTREMITY VENOUS (DVT) - US Venous Img Lower Bilateral  4. Left leg pain Rule out DVT although that think this is probably a superficial thrombophlebitis. - US Venous Img Lower Bilateral  5. Class 2 severe obesity due to excess calories with serious comorbidity and body mass index (BMI) of 38.0 to 38.9 in adult [facility]) Attempt to weight loss is stressed.  6. R/o DVT of  vein, acute left (HCC) Dopplers of lower extremities  7. OSA on CPAP Uses nightly.  8. Chronic bilateral low back pain with bilateral sciatica By neurosurgery.  Unfortunately he is on chronic high-dose narcotics.  Thank he has had 7 surgeries on his back and neck.  Follow up in 3-4 weeks.     I,Lisanne Ponce,acting as a scribe for [name], MD.,have documented all relevant documentation on the behalf of [name], MD,as directed by  [name], MD while in the presence of [name], MD.      [name], MD  [facility]

## 2020-02-08 NOTE — Telephone Encounter (Signed)
PA has been submitted. Waiting for approval.

## 2020-02-11 ENCOUNTER — Ambulatory Visit (INDEPENDENT_AMBULATORY_CARE_PROVIDER_SITE_OTHER): Payer: Medicare Other | Admitting: Family Medicine

## 2020-02-11 ENCOUNTER — Other Ambulatory Visit: Payer: Self-pay

## 2020-02-11 ENCOUNTER — Encounter: Payer: Self-pay | Admitting: Family Medicine

## 2020-02-11 VITALS — BP 131/68 | HR 86 | Temp 97.5°F | Resp 18 | Ht 70.0 in | Wt 272.0 lb

## 2020-02-11 DIAGNOSIS — I82A12 Acute embolism and thrombosis of left axillary vein: Secondary | ICD-10-CM | POA: Diagnosis not present

## 2020-02-11 DIAGNOSIS — M5442 Lumbago with sciatica, left side: Secondary | ICD-10-CM | POA: Diagnosis not present

## 2020-02-11 DIAGNOSIS — Z6838 Body mass index (BMI) 38.0-38.9, adult: Secondary | ICD-10-CM

## 2020-02-11 DIAGNOSIS — Z9989 Dependence on other enabling machines and devices: Secondary | ICD-10-CM

## 2020-02-11 DIAGNOSIS — G8929 Other chronic pain: Secondary | ICD-10-CM

## 2020-02-11 DIAGNOSIS — E1165 Type 2 diabetes mellitus with hyperglycemia: Secondary | ICD-10-CM | POA: Diagnosis not present

## 2020-02-11 DIAGNOSIS — M79605 Pain in left leg: Secondary | ICD-10-CM | POA: Diagnosis not present

## 2020-02-11 DIAGNOSIS — M5441 Lumbago with sciatica, right side: Secondary | ICD-10-CM | POA: Diagnosis not present

## 2020-02-11 DIAGNOSIS — E118 Type 2 diabetes mellitus with unspecified complications: Secondary | ICD-10-CM

## 2020-02-11 DIAGNOSIS — G4733 Obstructive sleep apnea (adult) (pediatric): Secondary | ICD-10-CM | POA: Diagnosis not present

## 2020-02-11 DIAGNOSIS — I10 Essential (primary) hypertension: Secondary | ICD-10-CM

## 2020-02-11 DIAGNOSIS — IMO0002 Reserved for concepts with insufficient information to code with codable children: Secondary | ICD-10-CM

## 2020-02-11 DIAGNOSIS — G2581 Restless legs syndrome: Secondary | ICD-10-CM | POA: Diagnosis not present

## 2020-02-11 LAB — POCT GLYCOSYLATED HEMOGLOBIN (HGB A1C)
Est. average glucose Bld gHb Est-mCnc: 217
Hemoglobin A1C: 9.2 % — AB (ref 4.0–5.6)

## 2020-02-11 MED ORDER — ROPINIROLE HCL 1 MG PO TABS: ORAL_TABLET | ORAL | 1 refills | Status: DC

## 2020-02-11 NOTE — Telephone Encounter (Signed)
PA was approved. 

## 2020-02-12 ENCOUNTER — Other Ambulatory Visit: Payer: Self-pay

## 2020-02-12 ENCOUNTER — Ambulatory Visit (INDEPENDENT_AMBULATORY_CARE_PROVIDER_SITE_OTHER): Payer: Medicare Other | Admitting: Sports Medicine

## 2020-02-12 ENCOUNTER — Encounter: Payer: Self-pay | Admitting: Sports Medicine

## 2020-02-12 DIAGNOSIS — B351 Tinea unguium: Secondary | ICD-10-CM

## 2020-02-12 DIAGNOSIS — M79675 Pain in left toe(s): Secondary | ICD-10-CM | POA: Diagnosis not present

## 2020-02-12 DIAGNOSIS — M79674 Pain in right toe(s): Secondary | ICD-10-CM | POA: Diagnosis not present

## 2020-02-12 DIAGNOSIS — E1142 Type 2 diabetes mellitus with diabetic polyneuropathy: Secondary | ICD-10-CM | POA: Diagnosis not present

## 2020-02-12 DIAGNOSIS — I739 Peripheral vascular disease, unspecified: Secondary | ICD-10-CM | POA: Diagnosis not present

## 2020-02-12 NOTE — Progress Notes (Signed)
Subjective: Jerry Parsons is a 69 y.o. male patient with history of diabetes who presents to office today complaining of long,mildly painful nails while ambulating in shoes; unable to trim.  Patient states that the glucose reading this morning was 170mg /dl. Last A1c 9.2 Patient denies any new changes in medication or new problems.   Patient Active Problem List   Diagnosis Date Noted  . Osteoarthritis of both knees 11/11/2018  . OSA on CPAP 10/14/2018  . MRSA bacteremia 05/19/2018  . Lumbar spondylosis 05/05/2018  . Spinal stenosis of lumbar region 05/05/2018  . Constipation, chronic 03/08/2018  . Pain in limb 03/03/2018  . Gastroesophageal reflux disease without esophagitis 01/04/2018  . OA (osteoarthritis) of knee 06/20/2017  . Sepsis due to pneumonia ([place]) 05/07/2017  . Neuropathy 10/13/2016  . Amblyopia 12/30/2015  . 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 NS (nuclear sclerosis) 12/30/2015  . Pseudoaphakia 12/30/2015  . Dehydration 09/08/2015  . Aspiration pneumonia ([place]) 07/04/2015  . Paroxysmal atrial fibrillation ([place]) 07/04/2015  . Arthritis of knee, degenerative 05/28/2015  . Chronic back pain 09/17/2014  . Leg edema 05/07/2014  . Chronic diastolic CHF (congestive heart failure) ([place]) 05/07/2014  . Campylobacter diarrhea 04/25/2013  . Atrial flutter ([place]) 01/23/2013  . Obesity 05/19/2012  . Hyperlipidemia 11/11/2011  . Diastolic dysfunction 0000000  . SOB (shortness of breath) 04/12/2011  . Diabetes mellitus type 2, uncontrolled, with complications ([place]) [id number]  . HYPERTENSION, BENIGN 03/15/2011  . DVT 03/15/2011  . TACHYCARDIA 03/15/2011   Current Outpatient Medications on File Prior to Visit  Medication Sig Dispense Refill  . aluminum chloride (DRYSOL) 20 % external solution Apply topically at bedtime. 60 mL 5  . amiodarone (PACERONE) 200 MG tablet TAKE 1 TABLET BY MOUTH EVERY DAY 90 tablet 2  . aspirin 81 MG tablet Take 81 mg by mouth daily.    . cholecalciferol  (VITAMIN D3) 25 MCG (1000 UT) tablet Take 1,000 Units by mouth daily.    . cyclobenzaprine (FLEXERIL) 10 MG tablet TAKE 1 TABLET BY MOUTH 3 TIMES A DAY 90 tablet 12  . docusate sodium (COLACE) 250 MG capsule Take by mouth.    Mariane Baumgarten Sodium (STOOL SOFTENER LAXATIVE PO) Take by mouth.    . dutasteride (AVODART) 0.5 MG capsule TAKE 1 CAPSULE DAILY 90 capsule 3  . esomeprazole (NEXIUM) 40 MG capsule Take 1 capsule (40 mg total) by mouth 2 (two) times daily. 90 capsule 3  . famotidine (PEPCID) 20 MG tablet Take 1 tablet (20 mg total) by mouth daily. 30 tablet 12  . fexofenadine (ALLEGRA) 180 MG tablet Take 180 mg by mouth daily.      . furosemide (LASIX) 40 MG tablet TAKE 1 TABLET (40 MG) BY MOUTH ONCE DAILY, YOU MAY TAKE 1 EXTRA TABLET (40 MG) AFTER LUNCH AS NEEDED FOR LEG SWELLING/ ABDOMINAL TIGHTNESS 180 tablet 1  . gabapentin (NEURONTIN) 100 MG capsule TAKE 1 CAPSULE BY MOUTH TWICE A DAY 180 capsule 2  . hydrochlorothiazide (HYDRODIURIL) 12.5 MG tablet TAKE 2 TABLETS (25 MG TOTAL) BY MOUTH AT BEDTIME. 180 tablet 1  . insulin aspart (NOVOLOG FLEXPEN) 100 UNIT/ML FlexPen Inject 12 Units into the skin 3 (three) times daily with meals. (Patient taking differently: Inject 14 Units into the skin 3 (three) times daily with meals. ) 45 mL 3  . Insulin Pen Needle (PEN NEEDLES) 31G X 5 MM MISC 1 each by Does not apply route daily. PATIENT NEEDS NOVA TWIST NEEDLES 5 MM  DX E11.9 200 each 3  .  JANUVIA 100 MG tablet TAKE 1 TABLET DAILY 90 tablet 3  . LANTUS SOLOSTAR 100 UNIT/ML Solostar Pen INJECT 35 UNITS INTO THE   SKIN TWO TIMES A DAY 45 mL 3  . liraglutide (VICTOZA) 18 MG/3ML SOPN INJECT 0.3ML (=1.8MG )      SUBCUTANEOUSLY DAILY 27 mL 3  . magnesium oxide (MAG-OX) 400 MG tablet TAKE 1 TABLET (400 MG TOTAL) BY MOUTH 2 (TWO) TIMES DAILY. 180 tablet 3  . metoprolol tartrate (LOPRESSOR) 100 MG tablet TAKE 1 TABLET (100 MG TOTAL) BY MOUTH 2 (TWO) TIMES DAILY. 180 tablet 3  . Multiple Vitamins-Minerals (CENTRUM  SILVER 50+MEN) TABS Take by mouth daily.    [name] NOVOTWIST 32G X 5 MM MISC USE 6 TIMES A DAY DX E11.9 100 each 9  . Omega-3 Fatty Acids (FISH OIL) 1200 MG CAPS Take 1,200 mg by mouth 2 (two) times daily.     [name] oxyCODONE (OXYCONTIN) 40 mg 12 hr tablet Take 1 tablet (40 mg total) by mouth every 8 (eight) hours as needed. 270 tablet 0  . pregabalin (LYRICA) 200 MG capsule TAKE 1 CAPSULE 3 TIMES A   DAY 270 capsule 1  . rOPINIRole (REQUIP) 1 MG tablet Take 1-2 tablets by mouth at bedtime as needed 60 tablet 1  . sucralfate (CARAFATE) 1 g tablet TAKE 1 TABLET BY MOUTH 4 (FOUR) TIMES DAILY - WITH MEALS AND AT BEDTIME. 360 tablet 1  . sulfamethoxazole-trimethoprim (BACTRIM) 400-80 MG tablet Take 1 tablet by mouth 2 (two) times daily.    [name] testosterone cypionate (DEPOTESTOSTERONE CYPIONATE) 200 MG/ML injection Inject 200 mg into the muscle every 21 ( twenty-one) days.     . Vitamin D, Ergocalciferol, (DRISDOL) 1.25 MG (50000 UT) CAPS capsule Take by mouth.     No current facility-administered medications on file prior to visit.   Allergies  Allergen Reactions  . Carisoprodol Itching    Recent Results (from the past 2160 hour(s))  POCT glycosylated hemoglobin (Hb A1C)     Status: Abnormal   Collection Time: 02/11/20  2:46 PM  Result Value Ref Range   Hemoglobin A1C 9.2 (A) 4.0 - 5.6 %   Est. average glucose Bld gHb Est-mCnc 217     Objective: General: Patient is awake, alert, and oriented x 3 and in no acute distress.  Integument: Skin is warm, dry and supple bilateral. Nails are tender, long, thickened and dystrophic with subungual debris, consistent with onychomycosis, 1-5 bilateral. No signs of infection. No open lesions or preulcerative lesions present bilateral. Remaining integument unremarkable. .  Vasculature:  Dorsalis Pedis pulse 1/4 bilateral. Posterior Tibial pulse  0/4 bilateral. Capillary fill time <3 sec 1-5 bilateral. No hair growth to the level of the digits.Temperature gradient  within normal limits. No varicosities present bilateral. 1+ pitting edema present bilateral. +Stucco dermatitis due to PVD without infection.   Neurology: The patient has absent sensation measured with a 5.07/10g Semmes Weinstein Monofilament at all pedal sites bilateral. Vibratory sensation absent bilateral with tuning fork. No Babinski sign present bilateral.   Musculoskeletal: Asymptomatic hammertoes pedal deformities noted bilateral. Muscular strength 5/5 in all lower extremity muscular groups bilateral without pain on range of motion. No tenderness with calf compression bilateral.  Assessment and Plan: Problem List Items Addressed This Visit    None    Visit Diagnoses    Pain due to onychomycosis of toenails of both feet    -  Primary   Diabetic polyneuropathy associated with type 2 diabetes mellitus ([place])  PAD (peripheral artery disease) ([place])         -Examined patient. -Re-Discussed and educated patient on diabetic foot care, especially with  regards to the vascular, neurological and musculoskeletal systems.  -Mechanically debrided all nails 1-5 bilateral using sterile nail nipper and filed with dremel without incident  -Answered all patient questions -Patient to return  in 2.5 to 3 months for at risk foot care -Patient advised to call the office if any problems or questions arise in the meantime.  Landis Martins, DPM

## 2020-02-15 ENCOUNTER — Other Ambulatory Visit: Payer: Self-pay

## 2020-02-15 ENCOUNTER — Ambulatory Visit
Admission: RE | Admit: 2020-02-15 | Discharge: 2020-02-15 | Disposition: A | Discharge status: Home / Self Care | Payer: Medicare Other | Source: Ambulatory Visit | Attending: Family Medicine | Admitting: Family Medicine

## 2020-02-15 DIAGNOSIS — M79605 Pain in left leg: Secondary | ICD-10-CM | POA: Insufficient documentation

## 2020-02-15 DIAGNOSIS — G2581 Restless legs syndrome: Secondary | ICD-10-CM | POA: Diagnosis not present

## 2020-02-15 DIAGNOSIS — E119 Type 2 diabetes mellitus without complications: Secondary | ICD-10-CM | POA: Diagnosis not present

## 2020-02-15 DIAGNOSIS — R600 Localized edema: Secondary | ICD-10-CM | POA: Diagnosis not present

## 2020-02-15 LAB — HM DIABETES EYE EXAM

## 2020-02-20 ENCOUNTER — Encounter: Payer: Self-pay | Admitting: *Deleted

## 2020-02-26 ENCOUNTER — Other Ambulatory Visit: Payer: Self-pay | Admitting: Family Medicine

## 2020-02-28 ENCOUNTER — Other Ambulatory Visit: Payer: Self-pay

## 2020-02-28 ENCOUNTER — Ambulatory Visit: Payer: Medicare Other

## 2020-02-28 ENCOUNTER — Ambulatory Visit: Payer: Medicare Other | Attending: Internal Medicine

## 2020-02-28 DIAGNOSIS — Z23 Encounter for immunization: Secondary | ICD-10-CM

## 2020-02-28 NOTE — Progress Notes (Signed)
   Covid-19 Vaccination Clinic  Name:  Jerry Parsons    MRN: [id number] DOB: 11/26/1951  02/28/2020  Jerry Parsons was observed post Covid-19 immunization for 30 minutes based on pre-vaccination screening without incident. He was provided with Vaccine Information Sheet and instruction to access the V-Safe system. He states he feels dizzy and sweaty, took his vital signs, T 98.9, P94, R 18, o2 95%, BP 143/78, BG 168, gave him peanut butter crackers and ginger ale and watched him an additional 30 minutes, he stated he feels much better and was released.  Jerry Parsons was instructed to call 911 with any severe reactions post vaccine: [name] Difficulty breathing  . Swelling of face and throat  . A fast heartbeat  . A bad rash all over body  . Dizziness and weakness   Immunizations Administered    Name Date Dose VIS Date Route   Pfizer COVID-19 Vaccine 02/28/2020 10:06 AM 0.3 mL 12/07/2019 Intramuscular   Manufacturer: [place]   Lot: [id number]   [place]: [id number]

## 2020-03-05 ENCOUNTER — Other Ambulatory Visit: Payer: Self-pay | Admitting: Family Medicine

## 2020-03-05 DIAGNOSIS — G2581 Restless legs syndrome: Secondary | ICD-10-CM

## 2020-03-08 ENCOUNTER — Other Ambulatory Visit: Payer: Self-pay | Admitting: Family Medicine

## 2020-03-08 NOTE — Telephone Encounter (Signed)
Requested Prescriptions  Pending Prescriptions Disp Refills  . hydrochlorothiazide (HYDRODIURIL) 12.5 MG tablet [Pharmacy Med Name: HYDROCHLOROTHIAZIDE 12.5 MG TB] 180 tablet 1    Sig: TAKE 2 TABLETS (25 MG TOTAL) BY MOUTH AT BEDTIME.     Cardiovascular: Diuretics - Thiazide Failed - 03/08/2020 10:24 AM      Failed - Ca in normal range and within 360 days    Calcium  Date Value Ref Range Status  11/03/2018 11.0 (H) 8.6 - 10.2 mg/dL Final   Calcium, Total  Date Value Ref Range Status  04/12/2013 8.8 8.5 - 10.1 mg/dL Final         Failed - Cr in normal range and within 360 days    Creat  Date Value Ref Range Status  12/13/2017 1.39 (H) 0.70 - 1.25 mg/dL Final    Comment:    For patients >52 years of age, the reference limit for Creatinine is approximately 13% higher for people identified as African-American. .    Creatinine, Ser  Date Value Ref Range Status  11/03/2018 1.38 (H) 0.76 - 1.27 mg/dL Final         Failed - K in normal range and within 360 days    Potassium  Date Value Ref Range Status  11/03/2018 4.6 3.5 - 5.2 mmol/L Final  04/12/2013 4.0 3.5 - 5.1 mmol/L Final         Failed - Na in normal range and within 360 days    Sodium  Date Value Ref Range Status  11/03/2018 141 134 - 144 mmol/L Final  04/12/2013 137 136 - 145 mmol/L Final         Passed - Last BP in normal range    BP Readings from Last 1 Encounters:  02/11/20 131/68         Passed - Valid encounter within last 6 months    Recent Outpatient Visits          3 weeks ago Diabetes mellitus type 2, uncontrolled, with complications [facility])   [facility] Jerrol Banana., MD   5 months ago Diabetes mellitus type 2, uncontrolled, with complications [facility])   [facility] Jerrol Banana., MD   8 months ago Diabetes mellitus type 2, uncontrolled, with complications [facility])   [facility] Jerrol Banana., MD   1 year ago Diabetes mellitus type  2, uncontrolled, with complications [facility])   [facility] Jerrol Banana., MD   1 year ago Diabetes mellitus type 2, uncontrolled, with complications [facility])   [facility] Jerrol Banana., MD      Future Appointments            In 2 days Jerrol Banana., MD [facility], [place]

## 2020-03-10 ENCOUNTER — Other Ambulatory Visit: Payer: Self-pay

## 2020-03-10 ENCOUNTER — Ambulatory Visit (INDEPENDENT_AMBULATORY_CARE_PROVIDER_SITE_OTHER): Payer: Medicare Other | Admitting: Family Medicine

## 2020-03-10 ENCOUNTER — Encounter: Payer: Self-pay | Admitting: Family Medicine

## 2020-03-10 VITALS — BP 132/78 | HR 98 | Temp 96.0°F | Wt 273.6 lb

## 2020-03-10 DIAGNOSIS — E118 Type 2 diabetes mellitus with unspecified complications: Secondary | ICD-10-CM

## 2020-03-10 DIAGNOSIS — Z6838 Body mass index (BMI) 38.0-38.9, adult: Secondary | ICD-10-CM | POA: Diagnosis not present

## 2020-03-10 DIAGNOSIS — M48061 Spinal stenosis, lumbar region without neurogenic claudication: Secondary | ICD-10-CM | POA: Diagnosis not present

## 2020-03-10 DIAGNOSIS — IMO0002 Reserved for concepts with insufficient information to code with codable children: Secondary | ICD-10-CM

## 2020-03-10 DIAGNOSIS — E1165 Type 2 diabetes mellitus with hyperglycemia: Secondary | ICD-10-CM

## 2020-03-10 DIAGNOSIS — M170 Bilateral primary osteoarthritis of knee: Secondary | ICD-10-CM

## 2020-03-10 DIAGNOSIS — G4733 Obstructive sleep apnea (adult) (pediatric): Secondary | ICD-10-CM | POA: Diagnosis not present

## 2020-03-10 DIAGNOSIS — M79604 Pain in right leg: Secondary | ICD-10-CM

## 2020-03-10 DIAGNOSIS — M79605 Pain in left leg: Secondary | ICD-10-CM

## 2020-03-10 DIAGNOSIS — E782 Mixed hyperlipidemia: Secondary | ICD-10-CM

## 2020-03-10 DIAGNOSIS — E66812 Obesity, class 2: Secondary | ICD-10-CM

## 2020-03-10 DIAGNOSIS — G2581 Restless legs syndrome: Secondary | ICD-10-CM | POA: Diagnosis not present

## 2020-03-10 DIAGNOSIS — Z9989 Dependence on other enabling machines and devices: Secondary | ICD-10-CM

## 2020-03-10 MED ORDER — ROPINIROLE HCL 2 MG PO TABS: ORAL_TABLET | ORAL | 5 refills | Status: DC

## 2020-03-10 NOTE — Progress Notes (Signed)
[name]    DOB: Jun 08, 1951   69 y.o.   MRN: [id number] Visit Date: 03/10/2020  Today's Provider: [name], MD   Chief Complaint  Patient presents with  . Hypertension  . Diabetes   Subjective:     HPI  The ropinirole is helped patient's legs at night.  He has had a good response according to the patient and his wife.  He still has chronic ongoing swelling with some discomfort in the left lower leg.  I thought this was a superficial thrombophlebitis on the last visit and it may very well be.  I do not think it is infected. Diabetes mellitus type 2, uncontrolled, with complications ([place]) From 02/10/2019-Poorly controlled diabetes. Stressed he must watch his eating. Need to do urine microalbumin on his next visit. Hemoglobin A1C--8.9.  Essential hypertension, benign From 02/10/2019-Controlled  Restless leg syndrome From 02/10/2019-given rx for rOPINIRole (REQUIP) 1 MG. Obtained VAS [place] LOWER EXTREMITY VENOUS (DVT) and US Venous Img Lower Bilateral. Referred to Neurology.  Class 2 severe obesity due to excess calories with serious comorbidity and body mass index (BMI) of 38.0 to 38.9 in adult [facility]) From 02/10/2019-Attempt to weight loss is stressed.  OSA on CPAP From 02/10/2019-Uses nightly.   Allergies  Allergen Reactions  . Carisoprodol Itching     Current Outpatient Medications:  .  aluminum chloride (DRYSOL) 20 % external solution, Apply topically at bedtime., Disp: 60 mL, Rfl: 5 .  amiodarone (PACERONE) 200 MG tablet, TAKE 1 TABLET BY MOUTH EVERY DAY, Disp: 90 tablet, Rfl: 2 .  aspirin 81 MG tablet, Take 81 mg by mouth daily., Disp: , Rfl:  .  cholecalciferol (VITAMIN D3) 25 MCG (1000 UT) tablet, Take 1,000 Units by mouth daily., Disp: , Rfl:  .  cyclobenzaprine (FLEXERIL) 10 MG tablet, TAKE 1 TABLET BY MOUTH 3 TIMES A DAY, Disp: 90 tablet, Rfl: 12 .  docusate sodium (COLACE) 250 MG capsule, Take by mouth., Disp: , Rfl:  .  Docusate  Sodium (STOOL SOFTENER LAXATIVE PO), Take by mouth., Disp: , Rfl:  .  dutasteride (AVODART) 0.5 MG capsule, TAKE 1 CAPSULE DAILY, Disp: 90 capsule, Rfl: 3 .  esomeprazole (NEXIUM) 40 MG capsule, Take 1 capsule (40 mg total) by mouth 2 (two) times daily., Disp: 90 capsule, Rfl: 3 .  famotidine (PEPCID) 20 MG tablet, Take 1 tablet (20 mg total) by mouth daily., Disp: 30 tablet, Rfl: 12 .  fexofenadine (ALLEGRA) 180 MG tablet, Take 180 mg by mouth daily.  , Disp: , Rfl:  .  furosemide (LASIX) 40 MG tablet, TAKE 1 TABLET (40 MG) BY MOUTH ONCE DAILY, YOU MAY TAKE 1 EXTRA TABLET (40 MG) AFTER LUNCH AS NEEDED FOR LEG SWELLING/ ABDOMINAL TIGHTNESS, Disp: 180 tablet, Rfl: 1 .  gabapentin (NEURONTIN) 100 MG capsule, TAKE 1 CAPSULE BY MOUTH TWICE A DAY, Disp: 180 capsule, Rfl: 2 .  hydrochlorothiazide (HYDRODIURIL) 12.5 MG tablet, TAKE 2 TABLETS (25 MG TOTAL) BY MOUTH AT BEDTIME., Disp: 180 tablet, Rfl: 1 .  insulin aspart (NOVOLOG FLEXPEN) 100 UNIT/ML FlexPen, Inject 12 Units into the skin 3 (three) times daily with meals. (Patient taking differently: Inject 14 Units into the skin 3 (three) times daily with meals. ), Disp: 45 mL, Rfl: 3 .  Insulin Pen Needle (PEN NEEDLES) 31G X 5 MM MISC, 1 each by Does not apply route daily. PATIENT NEEDS NOVA TWIST NEEDLES 5 MM  DX E11.9, Disp: 200 each, Rfl: 3 .  JANUVIA 100 MG tablet, TAKE 1 TABLET DAILY, Disp: 90 tablet, Rfl: 3 .  LANTUS SOLOSTAR 100 UNIT/ML Solostar Pen, INJECT 35 UNITS INTO THE   SKIN TWO TIMES A DAY, Disp: 45 mL, Rfl: 3 .  liraglutide (VICTOZA) 18 MG/3ML SOPN, INJECT 0.3ML (=1.8MG )      SUBCUTANEOUSLY DAILY, Disp: 27 mL, Rfl: 3 .  magnesium oxide (MAG-OX) 400 MG tablet, TAKE 1 TABLET (400 MG TOTAL) BY MOUTH 2 (TWO) TIMES DAILY., Disp: 180 tablet, Rfl: 3 .  metoprolol tartrate (LOPRESSOR) 100 MG tablet, TAKE 1 TABLET (100 MG TOTAL) BY MOUTH 2 (TWO) TIMES DAILY., Disp: 60 tablet, Rfl: 11 .  Multiple Vitamins-Minerals (CENTRUM SILVER 50+MEN) TABS, Take by  mouth daily., Disp: , Rfl:  .  NOVOTWIST 32G X 5 MM MISC, USE 6 TIMES A DAY DX E11.9, Disp: 100 each, Rfl: 9 .  Omega-3 Fatty Acids (FISH OIL) 1200 MG CAPS, Take 1,200 mg by mouth 2 (two) times daily. , Disp: , Rfl:  .  oxyCODONE (OXYCONTIN) 40 mg 12 hr tablet, Take 1 tablet (40 mg total) by mouth every 8 (eight) hours as needed., Disp: 270 tablet, Rfl: 0 .  pregabalin (LYRICA) 200 MG capsule, TAKE 1 CAPSULE 3 TIMES A   DAY, Disp: 270 capsule, Rfl: 1 .  rOPINIRole (REQUIP) 1 MG tablet, TAKE 1 TO 2 TABLETS BY MOUTH AT BEDTIME AS NEEDED, Disp: 60 tablet, Rfl: 1 .  sucralfate (CARAFATE) 1 g tablet, TAKE 1 TABLET BY MOUTH 4 (FOUR) TIMES DAILY - WITH MEALS AND AT BEDTIME., Disp: 360 tablet, Rfl: 1 .  sulfamethoxazole-trimethoprim (BACTRIM) 400-80 MG tablet, Take 1 tablet by mouth 2 (two) times daily., Disp: , Rfl:  .  testosterone cypionate (DEPOTESTOSTERONE CYPIONATE) 200 MG/ML injection, Inject 200 mg into the muscle every 21 ( twenty-one) days. , Disp: , Rfl:  .  Vitamin D, Ergocalciferol, (DRISDOL) 1.25 MG (50000 UT) CAPS capsule, Take by mouth., Disp: , Rfl:   Review of Systems  Constitutional: Negative for appetite change, chills and fever.  HENT: Negative.   Eyes: Negative.   Respiratory: Negative for chest tightness, shortness of breath and wheezing.   Cardiovascular: Negative for chest pain and palpitations.  Gastrointestinal: Negative for abdominal pain, nausea and vomiting.  Endocrine: Negative.   Musculoskeletal: Positive for arthralgias and back pain.  Skin: Positive for color change.  Allergic/Immunologic: Negative.   Neurological: Positive for numbness.  Hematological: Negative.   Psychiatric/Behavioral: Negative.     Social History   Tobacco Use  . Smoking status: Former Smoker    Packs/day: 2.00    Years: 20.00    Pack years: 40.00    Types: Cigarettes    Quit date: 12/26/1989    Years since quitting: 30.2  . Smokeless tobacco: Never Used  Substance Use Topics  .  Alcohol use: No      Objective:   BP 132/78 (BP Location: Right Arm, Patient Position: Sitting, Cuff Size: Large)   Pulse 98   Temp (!) 96 F (35.6 C) (Temporal)   Wt 273 lb 9.6 oz (124.1 kg)   BMI 39.26 kg/m  Vitals:   03/10/20 1447  BP: 132/78  Pulse: 98  Temp: (!) 96 F (35.6 C)  TempSrc: Temporal  Weight: 273 lb 9.6 oz (124.1 kg)  Body mass index is 39.26 kg/m.   Physical Exam Vitals reviewed.  Constitutional:      Appearance: He is well-developed. He is obese.  HENT:     Head: Normocephalic and atraumatic.  Right Ear: External ear normal.     Left Ear: External ear normal.     Nose: Nose normal.  Eyes:     General: No scleral icterus.    Conjunctiva/sclera: Conjunctivae normal.  Neck:     Thyroid: No thyromegaly.  Cardiovascular:     Rate and Rhythm: Normal rate and regular rhythm.     Heart sounds: Normal heart sounds.  Pulmonary:     Effort: Pulmonary effort is normal.     Breath sounds: Normal breath sounds.  Abdominal:     Palpations: Abdomen is soft.  Musculoskeletal:     Right lower leg: Edema present.     Left lower leg: Edema present.     Comments: He has type I plus to 2+ lower extremity edema without warmth or induration or erythema.  Skin:    General: Skin is warm and dry.     Comments: Both knees with clean abrasions from recent fall.  Neurological:     Mental Status: He is alert and oriented to person, place, and time. Mental status is at baseline.  Psychiatric:        Mood and Affect: Mood normal.        Behavior: Behavior normal.        Thought Content: Thought content normal.        Judgment: Judgment normal.      No results found for any visits on 03/10/20.     Assessment & Plan    1. RLS (restless legs syndrome) Much improved on ropinirole.  Will increase dose to 2 mg nightly and half of that dose in the morning.  Return to clinic 2 months.  2. OSA on CPAP Uses CPAP every night.  3. Diabetes mellitus type 2,  uncontrolled, with complications (HCC)/with CKD On Lantus, Victoza, Januvia.  4. Primary osteoarthritis of both knees   5. Spinal stenosis of lumbar region, unspecified whether neurogenic claudication present On high dose of gabapentin plus chronic narcotics.  6. Pain in both lower extremities Swelling is having is slowly getting worse.  He is not elevating his legs during the day.  May need referral to vascular for evaluation.  Dopplers were normal for venous Doppler  7. Class 2 severe obesity due to excess calories with serious comorbidity and body mass index (BMI) of 38.0 to 38.9 in adult [facility]) Diet exercise and weight loss has been discussed.  8. Mixed hyperlipidemia      [name], MD  [facility]

## 2020-03-21 DIAGNOSIS — N5201 Erectile dysfunction due to arterial insufficiency: Secondary | ICD-10-CM | POA: Diagnosis not present

## 2020-03-21 DIAGNOSIS — E291 Testicular hypofunction: Secondary | ICD-10-CM | POA: Diagnosis not present

## 2020-03-21 DIAGNOSIS — R102 Pelvic and perineal pain: Secondary | ICD-10-CM | POA: Diagnosis not present

## 2020-03-21 DIAGNOSIS — N500 Atrophy of testis: Secondary | ICD-10-CM | POA: Diagnosis not present

## 2020-04-02 ENCOUNTER — Other Ambulatory Visit: Payer: Self-pay | Admitting: Family Medicine

## 2020-04-02 DIAGNOSIS — G2581 Restless legs syndrome: Secondary | ICD-10-CM

## 2020-04-08 DIAGNOSIS — E291 Testicular hypofunction: Secondary | ICD-10-CM | POA: Diagnosis not present

## 2020-04-08 DIAGNOSIS — R351 Nocturia: Secondary | ICD-10-CM | POA: Diagnosis not present

## 2020-04-08 DIAGNOSIS — N401 Enlarged prostate with lower urinary tract symptoms: Secondary | ICD-10-CM | POA: Diagnosis not present

## 2020-04-08 DIAGNOSIS — Z79899 Other long term (current) drug therapy: Secondary | ICD-10-CM | POA: Diagnosis not present

## 2020-04-25 ENCOUNTER — Other Ambulatory Visit: Payer: Self-pay | Admitting: Family Medicine

## 2020-04-25 DIAGNOSIS — R110 Nausea: Secondary | ICD-10-CM

## 2020-04-29 ENCOUNTER — Other Ambulatory Visit: Payer: Self-pay | Admitting: Family Medicine

## 2020-04-29 DIAGNOSIS — R110 Nausea: Secondary | ICD-10-CM

## 2020-04-30 ENCOUNTER — Other Ambulatory Visit: Payer: Self-pay | Admitting: Podiatry

## 2020-04-30 ENCOUNTER — Encounter: Payer: Self-pay | Admitting: Podiatry

## 2020-04-30 ENCOUNTER — Other Ambulatory Visit: Payer: Self-pay

## 2020-04-30 ENCOUNTER — Ambulatory Visit (INDEPENDENT_AMBULATORY_CARE_PROVIDER_SITE_OTHER): Payer: Medicare Other

## 2020-04-30 ENCOUNTER — Ambulatory Visit (INDEPENDENT_AMBULATORY_CARE_PROVIDER_SITE_OTHER): Payer: Medicare Other | Admitting: Podiatry

## 2020-04-30 VITALS — Temp 97.1°F

## 2020-04-30 DIAGNOSIS — S9032XA Contusion of left foot, initial encounter: Secondary | ICD-10-CM | POA: Diagnosis not present

## 2020-04-30 DIAGNOSIS — S92422A Displaced fracture of distal phalanx of left great toe, initial encounter for closed fracture: Secondary | ICD-10-CM

## 2020-04-30 NOTE — Progress Notes (Signed)
Subjective:   Patient ID: Jerry Parsons, male   DOB: 69 y.o.   MRN: [id number]   HPI Patient presents stating I fell on my left foot and my big toe has been swollen.  Patient states it hit 2 days ago and its been sore since then he is concerned about a broken toe   ROS      Objective:  Physical Exam  Neurovascular status unchanged with patient diabetes under reasonably good control with moderate elevation.  He is found to have quite a bit of swelling of his left hallux involving the entire digit and it is local to this area.  Patient is found to have good digital perfusion well oriented x3     Assessment:  Probability for fracture of the left hallux secondary to injury     Plan:  8 H&P reviewed conditions and recommended rigid immobilization of the foot with surgical shoe.  Discussed the fracture and at this point organ to hold off but ultimately it may require fixation but given his sugar being moderately elevated and other pathology would like to avoid this if possible.  Patient will be seen back for [place] to recheck but I did explain it would probably take 8 to 12 weeks for this to heal and surgical shoe dispensed today  X-rays indicate that there is a fracture of the proximal phalanx of the left hallux within the mid substance of the bone itself.

## 2020-05-08 ENCOUNTER — Other Ambulatory Visit: Payer: Self-pay | Admitting: Family Medicine

## 2020-05-09 NOTE — Progress Notes (Signed)
Established patient visit  I,[name],acting as a scribe for [name], MD.,have documented all relevant documentation on the behalf of [name], MD,as directed by  [name], MD while in the presence of [name], MD.   Patient: Jerry Parsons   DOB: 1951/11/17   69 y.o. Male  MRN: [id number] Visit Date: 05/12/2020  Today's healthcare provider: [name], MD   Chief Complaint  Patient presents with  . Follow-up  . Diabetes  . Hypertension  . Hyperlipidemia   Subjective    HPI  patient is feeling well.  He has he has had both vaccines.  He is more worried about his wife as she is in the hospital with TIA.  She has had no significant health problems at this point. He recently tripped at home and broke his toe.  Podiatry is treating him with a boot. Diabetes Mellitus Type II, follow-up  Lab Results  Component Value Date   HGBA1C 9.2 (A) 02/11/2020   HGBA1C 8.9 (A) 09/12/2019   HGBA1C 8.6 (A) 06/14/2019   Last seen for diabetes 3 months ago.  Management since then includes continuing the same treatment. He reports good compliance with treatment. He is not having side effects. none  Home blood sugar records: fasting range: 110-160  Episodes of hypoglycemia? No none   Current insulin regiment:  Most Recent Eye Exam: 02/15/2020  --------------------------------------------------------------------  Hypertension, follow-up  BP Readings from Last 3 Encounters:  05/12/20 115/71  03/10/20 132/78  02/11/20 131/68   Wt Readings from Last 3 Encounters:  05/12/20 267 lb (121.1 kg)  03/10/20 273 lb 9.6 oz (124.1 kg)  02/11/20 272 lb (123.4 kg)     He was last seen for hypertension 3 months ago.  BP at that visit was 131/68. Management since that visit includes; controlled. He reports good compliance with treatment. He is not having side effects. none He is not exercising. He is adherent to low salt diet.   Outside  blood pressures are 130/70-140/72.  He does not smoke.  Use of agents associated with hypertension: none.   --------------------------------------------------------------------  Lipid/Cholesterol, follow-up  Last Lipid Panel: Lab Results  Component Value Date   CHOL 130 06/12/2018   LDLCALC 42 06/12/2018   HDL 29 (L) 06/12/2018   TRIG 297 (H) 06/12/2018    He was last seen for this 06/12/2018.  Management since that visit includes; labs checked showing-stable.  He reports good compliance with treatment. He is not having side effects. none He is following a Regular diet. Current exercise: none  Last metabolic panel Lab Results  Component Value Date   GLUCOSE 125 (H) 11/03/2018   NA 141 11/03/2018   K 4.6 11/03/2018   BUN 22 11/03/2018   CREATININE 1.38 (H) 11/03/2018   GFRNONAA 52 (L) 11/03/2018   GFRAA 60 11/03/2018   CALCIUM 11.0 (H) 11/03/2018   AST 22 06/12/2018   ALT 29 06/12/2018   The 10-year ASCVD risk score Mikey Bussing DC Jr., et al., 2013) is: 31.8%  --------------------------------------------------------------------  RLS (restless legs syndrome) From 03/10/2020-Much improved on ropinirole.  Will increase dose to 2 mg nightly and half of that dose in the morning.  Return to clinic 2 months.  Spinal stenosis of lumbar region, unspecified whether neurogenic claudication present From 03/10/2020-On high dose of gabapentin plus chronic narcotics.  Pain in both lower extremities From 03/10/2020-Swelling is having is slowly getting worse.  He is not elevating his legs during the  day.  May need referral to vascular for evaluation.  Dopplers were normal for venous Doppler  Class 2 severe obesity due to excess calories with serious comorbidity and body mass index (BMI) of 38.0 to 38.9 in adult [facility]) From 03/10/2020-Diet exercise and weight loss has been discussed.      Medications: Outpatient Medications Prior to Visit  Medication Sig  . amiodarone (PACERONE) 200  MG tablet TAKE 1 TABLET BY MOUTH EVERY DAY  . aspirin 81 MG tablet Take 81 mg by mouth daily.  . cholecalciferol (VITAMIN D3) 25 MCG (1000 UT) tablet Take 1,000 Units by mouth daily.  . cyclobenzaprine (FLEXERIL) 10 MG tablet TAKE 1 TABLET BY MOUTH 3 TIMES A DAY  . docusate sodium (COLACE) 250 MG capsule Take by mouth.  Mariane Baumgarten Sodium (STOOL SOFTENER LAXATIVE PO) Take by mouth.  . dutasteride (AVODART) 0.5 MG capsule TAKE 1 CAPSULE DAILY  . esomeprazole (NEXIUM) 40 MG capsule Take 1 capsule (40 mg total) by mouth 2 (two) times daily.  . fexofenadine (ALLEGRA) 180 MG tablet Take 180 mg by mouth daily.    . furosemide (LASIX) 40 MG tablet TAKE 1 TABLET (40 MG) BY MOUTH ONCE DAILY, YOU MAY TAKE 1 EXTRA TABLET (40 MG) AFTER LUNCH AS NEEDED FOR LEG SWELLING/ ABDOMINAL TIGHTNESS  . gabapentin (NEURONTIN) 100 MG capsule TAKE 1 CAPSULE BY MOUTH TWICE A DAY  . hydrochlorothiazide (HYDRODIURIL) 12.5 MG tablet TAKE 2 TABLETS (25 MG TOTAL) BY MOUTH AT BEDTIME.  . insulin aspart (NOVOLOG FLEXPEN) 100 UNIT/ML FlexPen Inject 12 Units into the skin 3 (three) times daily with meals. (Patient taking differently: Inject 14 Units into the skin 3 (three) times daily with meals. )  . Insulin Pen Needle (PEN NEEDLES) 31G X 5 MM MISC 1 each by Does not apply route daily. PATIENT NEEDS NOVA TWIST NEEDLES 5 MM  DX E11.9  . JANUVIA 100 MG tablet TAKE 1 TABLET DAILY  . LANTUS SOLOSTAR 100 UNIT/ML Solostar Pen INJECT 35 UNITS INTO THE   SKIN TWO TIMES A DAY  . liraglutide (VICTOZA) 18 MG/3ML SOPN INJECT 0.3ML (=1.8MG )      SUBCUTANEOUSLY DAILY  . magnesium oxide (MAG-OX) 400 MG tablet TAKE 1 TABLET (400 MG TOTAL) BY MOUTH 2 (TWO) TIMES DAILY.  . metoprolol tartrate (LOPRESSOR) 100 MG tablet TAKE 1 TABLET (100 MG TOTAL) BY MOUTH 2 (TWO) TIMES DAILY.  . Multiple Vitamins-Minerals (CENTRUM SILVER 50+MEN) TABS Take by mouth daily.  [name] NOVOTWIST 32G X 5 MM MISC USE 6 TIMES A DAY DX E11.9  . Omega-3 Fatty Acids (FISH OIL) 1200  MG CAPS Take 1,200 mg by mouth 2 (two) times daily.   [name] oxyCODONE (OXYCONTIN) 40 mg 12 hr tablet Take 1 tablet (40 mg total) by mouth every 8 (eight) hours as needed.  . pregabalin (LYRICA) 200 MG capsule TAKE 1 CAPSULE 3 TIMES A   DAY  . rOPINIRole (REQUIP) 2 MG tablet Take 1 tablet twice a day as needed.  . sucralfate (CARAFATE) 1 g tablet TAKE 1 TABLET BY MOUTH 4 (FOUR) TIMES DAILY - WITH MEALS AND AT BEDTIME.  [name] testosterone cypionate (DEPOTESTOSTERONE CYPIONATE) 200 MG/ML injection Inject 200 mg into the muscle every 21 ( twenty-one) days.   . Vitamin D, Ergocalciferol, (DRISDOL) 1.25 MG (50000 UT) CAPS capsule Take by mouth.  [name] aluminum chloride (DRYSOL) 20 % external solution Apply topically at bedtime. (Patient not taking: Reported on 05/12/2020)  . famotidine (PEPCID) 20 MG tablet Take 1 tablet (20 mg total)  by mouth daily. (Patient not taking: Reported on 05/12/2020)  . sulfamethoxazole-trimethoprim (BACTRIM) 400-80 MG tablet Take 1 tablet by mouth 2 (two) times daily.   No facility-administered medications prior to visit.    Review of Systems  Constitutional: Negative for appetite change, chills and fever.  HENT: Negative.   Eyes: Negative.   Respiratory: Negative for chest tightness, shortness of breath and wheezing.   Cardiovascular: Negative for chest pain and palpitations.  Gastrointestinal: Negative for abdominal pain, nausea and vomiting.  Endocrine: Negative.   Musculoskeletal: Positive for arthralgias and back pain.  Skin: Negative.   Allergic/Immunologic: Negative.   Hematological: Negative.   Psychiatric/Behavioral: Negative.        Objective    BP 115/71 (BP Location: Left Arm, Patient Position: Sitting, Cuff Size: Large)   Pulse 72   Temp (!) 96.9 F (36.1 C) (Other (Comment))   Resp 16   Ht 5\' 10"  (1.778 m)   Wt 267 lb (121.1 kg)   SpO2 96%   BMI 38.31 kg/m     Physical Exam Vitals and nursing note reviewed.  Constitutional:      Appearance: Normal  appearance. He is normal weight.  Eyes:     Conjunctiva/sclera: Conjunctivae normal.  Cardiovascular:     Rate and Rhythm: Normal rate and regular rhythm.     Pulses: Normal pulses.     Heart sounds: Normal heart sounds.  Pulmonary:     Effort: Pulmonary effort is normal.     Breath sounds: Normal breath sounds.  Abdominal:     General: Bowel sounds are normal.  Musculoskeletal:     Cervical back: Normal range of motion and neck supple.     Comments: Walking boot on his foot.  Skin:    General: Skin is warm and dry.  Neurological:     General: No focal deficit present.     Mental Status: He is alert and oriented to person, place, and time.  Psychiatric:        Mood and Affect: Mood normal.        Behavior: Behavior normal.        Thought Content: Thought content normal.        Judgment: Judgment normal.       No results found for any visits on 05/12/20.  Assessment & Plan     1. Diabetes mellitus type 2, uncontrolled, with complications (HCC) follow-up A1c.  Goal less than 7.  Patient working on diet and exercise habits. - Lipid panel - TSH - Comprehensive Metabolic Panel (CMET) - CBC w/Diff/Platelet - Hemoglobin A1C  2. Essential hypertension, benign good control on HCTZ and metoprolol.  We need to check on ARB on his next visit. - Lipid panel - TSH - Comprehensive Metabolic Panel (CMET) - CBC w/Diff/Platelet - Hemoglobin A1C  3. Mixed hyperlipidemia last LDL cholesterol 42 - Lipid panel - TSH - Comprehensive Metabolic Panel (CMET) - CBC w/Diff/Platelet - Hemoglobin A1C  4. Paroxysmal atrial fibrillation (HCC) on metoprolol amiodarone - Lipid panel - TSH - Comprehensive Metabolic Panel (CMET) - CBC w/Diff/Platelet - Hemoglobin A1C  5. OSA on CPAP uses CPAP nightly  6. RLS (restless legs syndrome) helped by ropinirole  7. Class 2 severe obesity due to excess calories with serious comorbidity and body mass index (BMI) of 38.0 to 38.9 in adult  [facility]) patient to work on diet and exercise  8. Spinal stenosis of lumbar region, unspecified whether neurogenic claudication present chronic pain fairly well controlled. - oxyCODONE (OXYCONTIN)  40 mg 12 hr tablet; Take 1 tablet (40 mg total) by mouth every 8 (eight) hours as needed.  Dispense: 270 tablet; Refill: 0  9. Closed nondisplaced fracture of phalanx of toe, unspecified laterality, unspecified toe, initial encounter Per podiatry.   No follow-ups on file.      I, [name], MD, have reviewed all documentation for this visit. The documentation on 05/16/20 for the exam, diagnosis, procedures, and orders are all accurate and complete.    Diana Davenport Cranford Mon, MD  [facility] [phone] (phone) [phone] (fax)  [place]

## 2020-05-12 ENCOUNTER — Ambulatory Visit (INDEPENDENT_AMBULATORY_CARE_PROVIDER_SITE_OTHER): Payer: Medicare Other | Admitting: Family Medicine

## 2020-05-12 ENCOUNTER — Other Ambulatory Visit: Payer: Self-pay

## 2020-05-12 ENCOUNTER — Encounter: Payer: Self-pay | Admitting: Family Medicine

## 2020-05-12 VITALS — BP 115/71 | HR 72 | Temp 96.9°F | Resp 16 | Ht 70.0 in | Wt 267.0 lb

## 2020-05-12 DIAGNOSIS — Z6838 Body mass index (BMI) 38.0-38.9, adult: Secondary | ICD-10-CM | POA: Diagnosis not present

## 2020-05-12 DIAGNOSIS — I10 Essential (primary) hypertension: Secondary | ICD-10-CM | POA: Diagnosis not present

## 2020-05-12 DIAGNOSIS — I480 Paroxysmal atrial fibrillation: Secondary | ICD-10-CM

## 2020-05-12 DIAGNOSIS — E1165 Type 2 diabetes mellitus with hyperglycemia: Secondary | ICD-10-CM

## 2020-05-12 DIAGNOSIS — G4733 Obstructive sleep apnea (adult) (pediatric): Secondary | ICD-10-CM

## 2020-05-12 DIAGNOSIS — E66812 Obesity, class 2: Secondary | ICD-10-CM

## 2020-05-12 DIAGNOSIS — E782 Mixed hyperlipidemia: Secondary | ICD-10-CM

## 2020-05-12 DIAGNOSIS — M48061 Spinal stenosis, lumbar region without neurogenic claudication: Secondary | ICD-10-CM | POA: Diagnosis not present

## 2020-05-12 DIAGNOSIS — Z9989 Dependence on other enabling machines and devices: Secondary | ICD-10-CM | POA: Diagnosis not present

## 2020-05-12 DIAGNOSIS — S92919A Unspecified fracture of unspecified toe(s), initial encounter for closed fracture: Secondary | ICD-10-CM

## 2020-05-12 DIAGNOSIS — IMO0002 Reserved for concepts with insufficient information to code with codable children: Secondary | ICD-10-CM

## 2020-05-12 DIAGNOSIS — G2581 Restless legs syndrome: Secondary | ICD-10-CM

## 2020-05-12 DIAGNOSIS — E118 Type 2 diabetes mellitus with unspecified complications: Secondary | ICD-10-CM | POA: Diagnosis not present

## 2020-05-12 MED ORDER — OXYCODONE HCL ER 40 MG PO T12A: 40.0000 mg | EXTENDED_RELEASE_TABLET | Freq: Three times a day (TID) | ORAL | 0 refills | Status: DC | PRN

## 2020-05-13 ENCOUNTER — Encounter: Payer: Self-pay | Admitting: Sports Medicine

## 2020-05-13 ENCOUNTER — Ambulatory Visit (INDEPENDENT_AMBULATORY_CARE_PROVIDER_SITE_OTHER): Payer: Medicare Other

## 2020-05-13 ENCOUNTER — Ambulatory Visit (INDEPENDENT_AMBULATORY_CARE_PROVIDER_SITE_OTHER): Payer: Medicare Other | Admitting: Sports Medicine

## 2020-05-13 VITALS — Temp 97.8°F

## 2020-05-13 DIAGNOSIS — M79675 Pain in left toe(s): Secondary | ICD-10-CM

## 2020-05-13 DIAGNOSIS — S92422D Displaced fracture of distal phalanx of left great toe, subsequent encounter for fracture with routine healing: Secondary | ICD-10-CM

## 2020-05-13 DIAGNOSIS — E1142 Type 2 diabetes mellitus with diabetic polyneuropathy: Secondary | ICD-10-CM | POA: Diagnosis not present

## 2020-05-13 DIAGNOSIS — M79674 Pain in right toe(s): Secondary | ICD-10-CM

## 2020-05-13 DIAGNOSIS — B351 Tinea unguium: Secondary | ICD-10-CM | POA: Diagnosis not present

## 2020-05-13 DIAGNOSIS — I739 Peripheral vascular disease, unspecified: Secondary | ICD-10-CM

## 2020-05-13 NOTE — Progress Notes (Signed)
Subjective: Jerry Parsons is a 69 y.o. male patient with history of diabetes who presents to office today complaining of long,mildly painful nails while ambulating in shoes; unable to trim.  Patient states that the glucose reading this morning was 148mg /dl. Patient also reports that he is using surgical shoe on left for toe fracture and reports that it is helping some. Still a little pain but not like it was last visit.  Reports that his wife has had a stroke. No other issues noted.   Patient Active Problem List   Diagnosis Date Noted  . Osteoarthritis of both knees 11/11/2018  . OSA on CPAP 10/14/2018  . MRSA bacteremia 05/19/2018  . Lumbar spondylosis 05/05/2018  . Spinal stenosis of lumbar region 05/05/2018  . Constipation, chronic 03/08/2018  . Pain in limb 03/03/2018  . Gastroesophageal reflux disease without esophagitis 01/04/2018  . OA (osteoarthritis) of knee 06/20/2017  . Sepsis due to pneumonia ([place]) 05/07/2017  . Neuropathy 10/13/2016  . Amblyopia 12/30/2015  . 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 NS (nuclear sclerosis) 12/30/2015  . Pseudoaphakia 12/30/2015  . Dehydration 09/08/2015  . Aspiration pneumonia ([place]) 07/04/2015  . Paroxysmal atrial fibrillation ([place]) 07/04/2015  . Arthritis of knee, degenerative 05/28/2015  . Chronic back pain 09/17/2014  . Leg edema 05/07/2014  . Chronic diastolic CHF (congestive heart failure) ([place]) 05/07/2014  . Campylobacter diarrhea 04/25/2013  . Atrial flutter ([place]) 01/23/2013  . Obesity 05/19/2012  . Hyperlipidemia 11/11/2011  . Diastolic dysfunction 0000000  . SOB (shortness of breath) 04/12/2011  . Diabetes mellitus type 2, uncontrolled, with complications ([place]) [id number]  . HYPERTENSION, BENIGN 03/15/2011  . DVT 03/15/2011  . TACHYCARDIA 03/15/2011   Current Outpatient Medications on File Prior to Visit  Medication Sig Dispense Refill  . aluminum chloride (DRYSOL) 20 % external solution Apply topically at bedtime. 60 mL 5  .  amiodarone (PACERONE) 200 MG tablet TAKE 1 TABLET BY MOUTH EVERY DAY 90 tablet 2  . aspirin 81 MG tablet Take 81 mg by mouth daily.    . cholecalciferol (VITAMIN D3) 25 MCG (1000 UT) tablet Take 1,000 Units by mouth daily.    . cyclobenzaprine (FLEXERIL) 10 MG tablet TAKE 1 TABLET BY MOUTH 3 TIMES A DAY 90 tablet 12  . docusate sodium (COLACE) 250 MG capsule Take by mouth.    Mariane Baumgarten Sodium (STOOL SOFTENER LAXATIVE PO) Take by mouth.    . dutasteride (AVODART) 0.5 MG capsule TAKE 1 CAPSULE DAILY 90 capsule 3  . esomeprazole (NEXIUM) 40 MG capsule Take 1 capsule (40 mg total) by mouth 2 (two) times daily. 90 capsule 3  . famotidine (PEPCID) 20 MG tablet Take 1 tablet (20 mg total) by mouth daily. 30 tablet 12  . fexofenadine (ALLEGRA) 180 MG tablet Take 180 mg by mouth daily.      . furosemide (LASIX) 40 MG tablet TAKE 1 TABLET (40 MG) BY MOUTH ONCE DAILY, YOU MAY TAKE 1 EXTRA TABLET (40 MG) AFTER LUNCH AS NEEDED FOR LEG SWELLING/ ABDOMINAL TIGHTNESS 180 tablet 0  . gabapentin (NEURONTIN) 100 MG capsule TAKE 1 CAPSULE BY MOUTH TWICE A DAY 180 capsule 2  . hydrochlorothiazide (HYDRODIURIL) 12.5 MG tablet TAKE 2 TABLETS (25 MG TOTAL) BY MOUTH AT BEDTIME. 180 tablet 1  . insulin aspart (NOVOLOG FLEXPEN) 100 UNIT/ML FlexPen Inject 12 Units into the skin 3 (three) times daily with meals. (Patient taking differently: Inject 14 Units into the skin 3 (three) times daily with meals. ) 45 mL 3  .  Insulin Pen Needle (PEN NEEDLES) 31G X 5 MM MISC 1 each by Does not apply route daily. PATIENT NEEDS NOVA TWIST NEEDLES 5 MM  DX E11.9 200 each 3  . JANUVIA 100 MG tablet TAKE 1 TABLET DAILY 90 tablet 3  . LANTUS SOLOSTAR 100 UNIT/ML Solostar Pen INJECT 35 UNITS INTO THE   SKIN TWO TIMES A DAY 45 mL 3  . liraglutide (VICTOZA) 18 MG/3ML SOPN INJECT 0.3ML (=1.8MG )      SUBCUTANEOUSLY DAILY 27 mL 3  . magnesium oxide (MAG-OX) 400 MG tablet TAKE 1 TABLET (400 MG TOTAL) BY MOUTH 2 (TWO) TIMES DAILY. 180 tablet 3  .  metoprolol tartrate (LOPRESSOR) 100 MG tablet TAKE 1 TABLET (100 MG TOTAL) BY MOUTH 2 (TWO) TIMES DAILY. 60 tablet 11  . Multiple Vitamins-Minerals (CENTRUM SILVER 50+MEN) TABS Take by mouth daily.    [name] NOVOTWIST 32G X 5 MM MISC USE 6 TIMES A DAY DX E11.9 100 each 9  . Omega-3 Fatty Acids (FISH OIL) 1200 MG CAPS Take 1,200 mg by mouth 2 (two) times daily.     [name] oxyCODONE (OXYCONTIN) 40 mg 12 hr tablet Take 1 tablet (40 mg total) by mouth every 8 (eight) hours as needed. 270 tablet 0  . pregabalin (LYRICA) 200 MG capsule TAKE 1 CAPSULE 3 TIMES A   DAY 270 capsule 1  . rOPINIRole (REQUIP) 2 MG tablet Take 1 tablet twice a day as needed. 60 tablet 5  . sucralfate (CARAFATE) 1 g tablet TAKE 1 TABLET BY MOUTH 4 (FOUR) TIMES DAILY - WITH MEALS AND AT BEDTIME. 360 tablet 1  . sulfamethoxazole-trimethoprim (BACTRIM) 400-80 MG tablet Take 1 tablet by mouth 2 (two) times daily.    [name] testosterone cypionate (DEPOTESTOSTERONE CYPIONATE) 200 MG/ML injection Inject 200 mg into the muscle every 21 ( twenty-one) days.     . Vitamin D, Ergocalciferol, (DRISDOL) 1.25 MG (50000 UT) CAPS capsule Take by mouth.     No current facility-administered medications on file prior to visit.   Allergies  Allergen Reactions  . Carisoprodol Itching  . Tamsulosin     Pt stated, "upsets my stomach"    Recent Results (from the past 2160 hour(s))  HM DIABETES EYE EXAM     Status: None   Collection Time: 02/15/20 12:00 AM  Result Value Ref Range   HM Diabetic Eye Exam No Retinopathy No Retinopathy    Objective: General: Patient is awake, alert, and oriented x 3 and in no acute distress.  Integument: Skin is warm, dry and supple bilateral. Nails are tender, long, thickened and dystrophic with subungual debris, consistent with onychomycosis, 1-5 bilateral. No signs of infection. No open lesions or preulcerative lesions present bilateral. Resolved contusion at left great toe. Remaining integument unremarkable.  Vasculature:   Dorsalis Pedis pulse 1/4 bilateral. Posterior Tibial pulse  0/4 bilateral. Capillary fill time <3 sec 1-5 bilateral. No hair growth to the level of the digits.Temperature gradient within normal limits. No varicosities present bilateral. 1+ pitting edema present bilateral. +Stucco dermatitis due to PVD without infection.   Neurology: The patient has absent sensation measured with a 5.07/10g Semmes Weinstein Monofilament at all pedal sites bilateral. Vibratory sensation absent bilateral with tuning fork. No Babinski sign present bilateral.   Musculoskeletal: Mild pain with palpation to left first toe.  Asymptomatic hammertoes pedal deformities noted bilateral. Muscular strength 5/5 in all lower extremity muscular groups bilateral without pain on range of motion. No tenderness with calf compression bilateral.  Xrays: Mild soft  tissue swelling, resolving 1st toe distal phalanx fracture, + hallux arthritis and valgus deformity.   Assessment and Plan: Problem List Items Addressed This Visit    None    Visit Diagnoses    Closed displaced fracture of distal phalanx of left great toe with routine healing, subsequent encounter    -  Primary   Relevant Orders   DG Foot Complete Left   Pain due to onychomycosis of toenails of both feet       Diabetic polyneuropathy associated with type 2 diabetes mellitus (HCC)       PAD (peripheral artery disease) ([place])         -Examined patient. -Re-Discussed and educated patient on diabetic foot care, especially with regards to the vascular, neurological and musculoskeletal systems.  -Mechanically debrided all nails 1-5 bilateral using sterile nail nipper and filed with dremel without incident  -Recommend continue with post op shoe for left toe fracture until next visit  -Answered all patient questions -Patient to return in 4-6 weeks for xray of left foot toe contusion/fracture -Patient advised to call the office if any problems or questions arise in the  meantime.  Landis Martins, DPM

## 2020-05-15 DIAGNOSIS — I480 Paroxysmal atrial fibrillation: Secondary | ICD-10-CM | POA: Diagnosis not present

## 2020-05-15 DIAGNOSIS — I10 Essential (primary) hypertension: Secondary | ICD-10-CM | POA: Diagnosis not present

## 2020-05-15 DIAGNOSIS — E1165 Type 2 diabetes mellitus with hyperglycemia: Secondary | ICD-10-CM | POA: Diagnosis not present

## 2020-05-15 DIAGNOSIS — E782 Mixed hyperlipidemia: Secondary | ICD-10-CM | POA: Diagnosis not present

## 2020-05-15 DIAGNOSIS — E118 Type 2 diabetes mellitus with unspecified complications: Secondary | ICD-10-CM | POA: Diagnosis not present

## 2020-05-16 LAB — LIPID PANEL
Chol/HDL Ratio: 4.6 ratio (ref 0.0–5.0)
Cholesterol, Total: 134 mg/dL (ref 100–199)
HDL: 29 mg/dL — ABNORMAL LOW (ref >39)
LDL Chol Calc (NIH): 67 mg/dL (ref 0–99)
Triglycerides: 233 mg/dL — ABNORMAL HIGH (ref 0–149)
VLDL Cholesterol Cal: 38 mg/dL (ref 5–40)

## 2020-05-16 LAB — COMPREHENSIVE METABOLIC PANEL
ALT: 26 IU/L (ref 0–44)
AST: 23 IU/L (ref 0–40)
Albumin/Globulin Ratio: 1.5 (ref 1.2–2.2)
Albumin: 4 g/dL (ref 3.8–4.8)
Alkaline Phosphatase: 117 IU/L (ref 48–121)
BUN/Creatinine Ratio: 15 (ref 10–24)
BUN: 20 mg/dL (ref 8–27)
Bilirubin Total: 0.6 mg/dL (ref 0.0–1.2)
CO2: 31 mmol/L — ABNORMAL HIGH (ref 20–29)
Calcium: 11 mg/dL — ABNORMAL HIGH (ref 8.6–10.2)
Chloride: 99 mmol/L (ref 96–106)
Creatinine, Ser: 1.33 mg/dL — ABNORMAL HIGH (ref 0.76–1.27)
GFR calc Af Amer: 62 mL/min/{1.73_m2} (ref >59)
GFR calc non Af Amer: 54 mL/min/{1.73_m2} — ABNORMAL LOW (ref >59)
Globulin, Total: 2.6 g/dL (ref 1.5–4.5)
Glucose: 135 mg/dL — ABNORMAL HIGH (ref 65–99)
Potassium: 4.1 mmol/L (ref 3.5–5.2)
Sodium: 140 mmol/L (ref 134–144)
Total Protein: 6.6 g/dL (ref 6.0–8.5)

## 2020-05-16 LAB — CBC WITH DIFFERENTIAL/PLATELET
Basophils Absolute: 0 10*3/uL (ref 0.0–0.2)
Basos: 1 %
EOS (ABSOLUTE): 0.2 10*3/uL (ref 0.0–0.4)
Eos: 3 %
Hematocrit: 44.4 % (ref 37.5–51.0)
Hemoglobin: 13.7 g/dL (ref 13.0–17.7)
Immature Grans (Abs): 0 10*3/uL (ref 0.0–0.1)
Immature Granulocytes: 0 %
Lymphocytes Absolute: 2.4 10*3/uL (ref 0.7–3.1)
Lymphs: 39 %
MCH: 24.1 pg — ABNORMAL LOW (ref 26.6–33.0)
MCHC: 30.9 g/dL — ABNORMAL LOW (ref 31.5–35.7)
MCV: 78 fL — ABNORMAL LOW (ref 79–97)
Monocytes Absolute: 0.6 10*3/uL (ref 0.1–0.9)
Monocytes: 10 %
Neutrophils Absolute: 2.9 10*3/uL (ref 1.4–7.0)
Neutrophils: 47 %
Platelets: 157 10*3/uL (ref 150–450)
RBC: 5.68 x10E6/uL (ref 4.14–5.80)
RDW: 16.5 % — ABNORMAL HIGH (ref 11.6–15.4)
WBC: 6.2 10*3/uL (ref 3.4–10.8)

## 2020-05-16 LAB — HEMOGLOBIN A1C
Est. average glucose Bld gHb Est-mCnc: 174 mg/dL
Hgb A1c MFr Bld: 7.7 % — ABNORMAL HIGH (ref 4.8–5.6)

## 2020-05-16 LAB — TSH: TSH: 0.491 u[IU]/mL (ref 0.450–4.500)

## 2020-05-18 ENCOUNTER — Other Ambulatory Visit: Payer: Self-pay | Admitting: Family Medicine

## 2020-05-18 ENCOUNTER — Other Ambulatory Visit: Payer: Self-pay | Admitting: Cardiovascular Disease

## 2020-05-18 DIAGNOSIS — E1165 Type 2 diabetes mellitus with hyperglycemia: Secondary | ICD-10-CM

## 2020-05-18 DIAGNOSIS — IMO0002 Reserved for concepts with insufficient information to code with codable children: Secondary | ICD-10-CM

## 2020-05-19 ENCOUNTER — Other Ambulatory Visit: Payer: Self-pay | Admitting: *Deleted

## 2020-05-19 DIAGNOSIS — L03119 Cellulitis of unspecified part of limb: Secondary | ICD-10-CM

## 2020-05-19 DIAGNOSIS — D539 Nutritional anemia, unspecified: Secondary | ICD-10-CM | POA: Diagnosis not present

## 2020-05-19 DIAGNOSIS — M79604 Pain in right leg: Secondary | ICD-10-CM

## 2020-05-19 DIAGNOSIS — I480 Paroxysmal atrial fibrillation: Secondary | ICD-10-CM

## 2020-05-19 DIAGNOSIS — D649 Anemia, unspecified: Secondary | ICD-10-CM | POA: Diagnosis not present

## 2020-05-19 DIAGNOSIS — G2581 Restless legs syndrome: Secondary | ICD-10-CM | POA: Diagnosis not present

## 2020-05-19 DIAGNOSIS — M79605 Pain in left leg: Secondary | ICD-10-CM | POA: Diagnosis not present

## 2020-05-19 MED ORDER — AMOXICILLIN-POT CLAVULANATE 875-125 MG PO TABS: 1.0000 | ORAL_TABLET | Freq: Two times a day (BID) | ORAL | 0 refills | Status: DC

## 2020-05-19 NOTE — Telephone Encounter (Signed)
Requested Prescriptions  Pending Prescriptions Disp Refills  . magnesium oxide (MAG-OX) 400 MG tablet [Pharmacy Med Name: MAGNESIUM OXIDE 400 MG TABLET] 180 tablet 3    Sig: TAKE 1 TABLET (400 MG TOTAL) BY MOUTH 2 (TWO) TIMES DAILY.     Endocrinology:  Minerals - Magnesium Supplementation Failed - 05/18/2020 10:51 PM      Failed - Mg Level in normal range and within 360 days    Magnesium  Date Value Ref Range Status  04/24/2018 1.8 1.7 - 2.4 mg/dL Final    Comment:    Performed at [facility], [address], [place], [place] [address]         Passed - Valid encounter within last 12 months    Recent Outpatient Visits          1 week ago Diabetes mellitus type 2, uncontrolled, with complications [facility])   [facility] Jerrol Banana., MD   2 months ago RLS (restless legs syndrome)   [facility] Jerrol Banana., MD   3 months ago Diabetes mellitus type 2, uncontrolled, with complications [facility])   [facility] Jerrol Banana., MD   8 months ago Diabetes mellitus type 2, uncontrolled, with complications [facility])   [facility] Jerrol Banana., MD   11 months ago Diabetes mellitus type 2, uncontrolled, with complications [facility])   [facility] Jerrol Banana., MD      Future Appointments            In 2 months Gollan, Kathlene November, MD [facility]

## 2020-05-20 LAB — VITAMIN B12: Vitamin B-12: 474 pg/mL (ref 232–1245)

## 2020-05-20 LAB — PTH, INTACT AND CALCIUM
Calcium: 11.6 mg/dL — ABNORMAL HIGH (ref 8.6–10.2)
PTH: 53 pg/mL (ref 15–65)

## 2020-05-20 LAB — CALCIUM, IONIZED: Calcium, Ion: 6.2 mg/dL — ABNORMAL HIGH (ref 4.5–5.6)

## 2020-05-20 LAB — IRON,TIBC AND FERRITIN PANEL
Ferritin: 22 ng/mL — ABNORMAL LOW (ref 30–400)
Iron Saturation: 18 % (ref 15–55)
Iron: 82 ug/dL (ref 38–169)
Total Iron Binding Capacity: 453 ug/dL — ABNORMAL HIGH (ref 250–450)
UIBC: 371 ug/dL — ABNORMAL HIGH (ref 111–343)

## 2020-05-23 ENCOUNTER — Telehealth: Payer: Self-pay

## 2020-05-23 NOTE — Telephone Encounter (Signed)
Returned Jerry Parsons's call and advised her on Jerry Parsons's labs. She said that she will call on Tuesday to make him a follow up appointment.

## 2020-05-23 NOTE — Telephone Encounter (Signed)
Called to advise patient as below. LVMTCB. If patient returns call it is okay for someone else to advise.

## 2020-05-23 NOTE — Telephone Encounter (Signed)
Patient wife is calling CMA back. Patient requested cma back Call back [phone]

## 2020-05-23 NOTE — Telephone Encounter (Signed)
-----   Message from Jerrol Banana., MD sent at 05/22/2020  3:26 PM EDT ----- Calcium level elevated.  Stop any calcium supplements and stop vitamin D. Follow-up in the next few weeks for further testing options if necessary.

## 2020-05-28 NOTE — Progress Notes (Signed)
Trena Platt Cummings,acting as a scribe for [name], MD.,have documented all relevant documentation on the behalf of [name], MD,as directed by  [name], MD while in the presence of [name], MD.  Established patient visit   Patient: Jerry Parsons   DOB: 1951-07-05   69 y.o. Male  MRN: [id number] Visit Date: 05/29/2020  Today's healthcare provider: [name], MD   Chief Complaint  Patient presents with  . Follow-up    labs   Subjective    HPI Patient is here for follow up labs. Labs last checked 05/19/2020 showing-Calcium level elevated. advised to stop any calcium supplements and stop vitamin D. Follow-up in the next few weeks for further testing options if necessary. He has an elevated calcium, ionized calcium, and a high normal PTH.     Medications: Outpatient Medications Prior to Visit  Medication Sig  . aluminum chloride (DRYSOL) 20 % external solution Apply topically at bedtime.  [name] amiodarone (PACERONE) 200 MG tablet TAKE 1 TABLET BY MOUTH EVERY DAY  . amoxicillin-clavulanate (AUGMENTIN) 875-125 MG tablet Take 1 tablet by mouth 2 (two) times daily.  [name] aspirin 81 MG tablet Take 81 mg by mouth daily.  . cholecalciferol (VITAMIN D3) 25 MCG (1000 UT) tablet Take 1,000 Units by mouth daily.  . cyclobenzaprine (FLEXERIL) 10 MG tablet TAKE 1 TABLET BY MOUTH 3 TIMES A DAY  . docusate sodium (COLACE) 250 MG capsule Take by mouth.  Mariane Baumgarten Sodium (STOOL SOFTENER LAXATIVE PO) Take by mouth.  . dutasteride (AVODART) 0.5 MG capsule TAKE 1 CAPSULE DAILY  . esomeprazole (NEXIUM) 40 MG capsule Take 1 capsule (40 mg total) by mouth 2 (two) times daily.  . fexofenadine (ALLEGRA) 180 MG tablet Take 180 mg by mouth daily.    . furosemide (LASIX) 40 MG tablet TAKE 1 TABLET (40 MG) BY MOUTH ONCE DAILY, YOU MAY TAKE 1 EXTRA TABLET (40 MG) AFTER LUNCH AS NEEDED FOR LEG SWELLING/ ABDOMINAL TIGHTNESS  . gabapentin (NEURONTIN) 100 MG capsule  TAKE 1 CAPSULE BY MOUTH TWICE A DAY  . hydrochlorothiazide (HYDRODIURIL) 12.5 MG tablet TAKE 2 TABLETS (25 MG TOTAL) BY MOUTH AT BEDTIME.  . insulin aspart (NOVOLOG FLEXPEN) 100 UNIT/ML FlexPen Inject 12 Units into the skin 3 (three) times daily with meals. (Patient taking differently: Inject 14 Units into the skin 3 (three) times daily with meals. )  . Insulin Pen Needle (PEN NEEDLES) 31G X 5 MM MISC 1 each by Does not apply route daily. PATIENT NEEDS NOVA TWIST NEEDLES 5 MM  DX E11.9  . JANUVIA 100 MG tablet TAKE 1 TABLET DAILY  . LANTUS SOLOSTAR 100 UNIT/ML Solostar Pen INJECT 35 UNITS INTO THE   SKIN TWO TIMES A DAY  . liraglutide (VICTOZA) 18 MG/3ML SOPN INJECT 0.3ML (=1.8MG )      SUBCUTANEOUSLY DAILY  . magnesium oxide (MAG-OX) 400 MG tablet TAKE 1 TABLET (400 MG TOTAL) BY MOUTH 2 (TWO) TIMES DAILY.  . metoprolol tartrate (LOPRESSOR) 100 MG tablet TAKE 1 TABLET (100 MG TOTAL) BY MOUTH 2 (TWO) TIMES DAILY.  [name] NOVOTWIST 32G X 5 MM MISC USE 6 TIMES A DAY DX E11.9  . Omega-3 Fatty Acids (FISH OIL) 1200 MG CAPS Take 1,200 mg by mouth 2 (two) times daily.   [name] oxyCODONE (OXYCONTIN) 40 mg 12 hr tablet Take 1 tablet (40 mg total) by mouth every 8 (eight) hours as needed.  . pregabalin (LYRICA) 200 MG capsule TAKE 1 CAPSULE  3 TIMES A   DAY  . rOPINIRole (REQUIP) 2 MG tablet Take 1 tablet twice a day as needed.  . sucralfate (CARAFATE) 1 g tablet TAKE 1 TABLET BY MOUTH 4 (FOUR) TIMES DAILY - WITH MEALS AND AT BEDTIME.  [name] testosterone cypionate (DEPOTESTOSTERONE CYPIONATE) 200 MG/ML injection Inject 200 mg into the muscle every 21 ( twenty-one) days.   . Vitamin D, Ergocalciferol, (DRISDOL) 1.25 MG (50000 UT) CAPS capsule Take by mouth.  . Multiple Vitamins-Minerals (CENTRUM SILVER 50+MEN) TABS Take by mouth daily.  [name] rOPINIRole (REQUIP) 1 MG tablet Take 1 mg by mouth 2 (two) times daily.  . [DISCONTINUED] famotidine (PEPCID) 20 MG tablet Take 1 tablet (20 mg total) by mouth daily. (Patient not taking:  Reported on 05/29/2020)  . [DISCONTINUED] sulfamethoxazole-trimethoprim (BACTRIM) 400-80 MG tablet Take 1 tablet by mouth 2 (two) times daily.   No facility-administered medications prior to visit.    Review of Systems  Constitutional: Negative for appetite change, chills and fever.  Respiratory: Negative for chest tightness, shortness of breath and wheezing.   Cardiovascular: Negative for chest pain and palpitations.  Gastrointestinal: Negative for abdominal pain, nausea and vomiting.       Objective    BP 127/73 (BP Location: Right Arm, Patient Position: Sitting, Cuff Size: Large)   Pulse 64   Temp (!) 96.9 F (36.1 C) (Temporal)   Ht 5\' 10"  (1.778 m)   Wt 266 lb (120.7 kg)   BMI 38.17 kg/m     Physical Exam Vitals reviewed.  Constitutional:      Appearance: He is well-developed. He is obese.  HENT:     Head: Normocephalic and atraumatic.     Right Ear: External ear normal.     Left Ear: External ear normal.     Nose: Nose normal.  Eyes:     General: No scleral icterus.    Conjunctiva/sclera: Conjunctivae normal.  Neck:     Thyroid: No thyromegaly.  Cardiovascular:     Rate and Rhythm: Normal rate and regular rhythm.     Heart sounds: Normal heart sounds.  Pulmonary:     Effort: Pulmonary effort is normal.     Breath sounds: Normal breath sounds.  Abdominal:     Palpations: Abdomen is soft.  Musculoskeletal:     Right lower leg: Edema present.     Left lower leg: Edema present.     Comments: He has mild bilateral swelling of both lower extremities with the left measuring 18 inches in the right 17 inches.  He also has tenderness without warmth along the medial portion of the upper left calf.  Do not feel a  cord but this is consistent with a superficial thrombophlebitis.  Skin:    General: Skin is warm and dry.  Neurological:     Mental Status: He is alert and oriented to person, place, and time. Mental status is at baseline.     Comments: He bounces his legs as  he sits.  He is having difficulty just sitting still.  Psychiatric:        Mood and Affect: Mood normal.        Behavior: Behavior normal.        Thought Content: Thought content normal.        Judgment: Judgment normal.       No results found for any visits on 05/29/20.  Assessment & Plan     1. Hypercalcemia  - Protein Electrophoresis, (serum) - Vitamin D (25 hydroxy) -  Calcium, ionized - Calcium - PTH, Intact and Calcium  2. Parathyroid adenoma Possible parathyroid disease.  Her to endocrine surgery. - Protein Electrophoresis, (serum) - Vitamin D (25 hydroxy) - Calcium, ionized - Ambulatory referral to General Surgery - Calcium - PTH, Intact and Calcium  3. Primary hyperparathyroidism ([place]) Initial diagnosis pending endocrine surgical appointment.   No follow-ups on file.      I, [name], MD, have reviewed all documentation for this visit. The documentation on 06/01/20 for the exam, diagnosis, procedures, and orders are all accurate and complete.    Zamari Bonsall Cranford Mon, MD  [facility] [phone] (phone) [phone] (fax)  [place]

## 2020-05-29 ENCOUNTER — Other Ambulatory Visit: Payer: Self-pay

## 2020-05-29 ENCOUNTER — Encounter: Payer: Self-pay | Admitting: Family Medicine

## 2020-05-29 ENCOUNTER — Ambulatory Visit (INDEPENDENT_AMBULATORY_CARE_PROVIDER_SITE_OTHER): Payer: Medicare Other | Admitting: Family Medicine

## 2020-05-29 DIAGNOSIS — E210 Primary hyperparathyroidism: Secondary | ICD-10-CM

## 2020-05-29 DIAGNOSIS — D351 Benign neoplasm of parathyroid gland: Secondary | ICD-10-CM | POA: Diagnosis not present

## 2020-05-30 LAB — VITAMIN D 25 HYDROXY (VIT D DEFICIENCY, FRACTURES): Vit D, 25-Hydroxy: 40.4 ng/mL (ref 30.0–100.0)

## 2020-05-30 LAB — PTH, INTACT AND CALCIUM
Calcium: 11.4 mg/dL — ABNORMAL HIGH (ref 8.6–10.2)
PTH: 41 pg/mL (ref 15–65)

## 2020-05-30 LAB — PROTEIN ELECTROPHORESIS, SERUM
A/G Ratio: 1.1 (ref 0.7–1.7)
Albumin ELP: 3.4 g/dL (ref 2.9–4.4)
Alpha 1: 0.2 g/dL (ref 0.0–0.4)
Alpha 2: 0.8 g/dL (ref 0.4–1.0)
Beta: 1.1 g/dL (ref 0.7–1.3)
Gamma Globulin: 1 g/dL (ref 0.4–1.8)
Globulin, Total: 3.1 g/dL (ref 2.2–3.9)
Total Protein: 6.5 g/dL (ref 6.0–8.5)

## 2020-05-30 LAB — CALCIUM, IONIZED: Calcium, Ion: 6 mg/dL — ABNORMAL HIGH (ref 4.5–5.6)

## 2020-06-02 ENCOUNTER — Telehealth: Payer: Self-pay

## 2020-06-02 NOTE — Telephone Encounter (Signed)
-----   Message from Jerrol Banana., MD sent at 06/02/2020 12:07 PM EDT ----- Patient with continued hypercalcemia but stable.

## 2020-06-02 NOTE — Telephone Encounter (Signed)
Patient's spouse advised.  

## 2020-06-03 ENCOUNTER — Ambulatory Visit: Payer: Medicare Other | Admitting: General Surgery

## 2020-06-03 ENCOUNTER — Other Ambulatory Visit: Payer: Self-pay | Admitting: General Surgery

## 2020-06-05 ENCOUNTER — Ambulatory Visit: Payer: Medicare Other | Admitting: General Surgery

## 2020-06-05 ENCOUNTER — Ambulatory Visit (INDEPENDENT_AMBULATORY_CARE_PROVIDER_SITE_OTHER): Payer: Medicare Other | Admitting: General Surgery

## 2020-06-05 ENCOUNTER — Observation Stay
Admission: EM | Admit: 2020-06-05 | Discharge: 2020-06-06 | Disposition: A | Discharge status: Home / Self Care | Payer: Medicare Other | Attending: Internal Medicine | Admitting: Internal Medicine

## 2020-06-05 ENCOUNTER — Emergency Department: Payer: Medicare Other

## 2020-06-05 ENCOUNTER — Encounter: Payer: Self-pay | Admitting: Emergency Medicine

## 2020-06-05 ENCOUNTER — Observation Stay: Payer: Medicare Other

## 2020-06-05 ENCOUNTER — Other Ambulatory Visit: Payer: Self-pay

## 2020-06-05 ENCOUNTER — Encounter: Payer: Self-pay | Admitting: General Surgery

## 2020-06-05 ENCOUNTER — Ambulatory Visit (INDEPENDENT_AMBULATORY_CARE_PROVIDER_SITE_OTHER): Payer: Self-pay

## 2020-06-05 VITALS — BP 153/74 | HR 97 | Temp 97.9°F | Ht 69.0 in | Wt 264.0 lb

## 2020-06-05 DIAGNOSIS — R55 Syncope and collapse: Principal | ICD-10-CM | POA: Insufficient documentation

## 2020-06-05 DIAGNOSIS — Z03818 Encounter for observation for suspected exposure to other biological agents ruled out: Secondary | ICD-10-CM | POA: Diagnosis not present

## 2020-06-05 DIAGNOSIS — R404 Transient alteration of awareness: Secondary | ICD-10-CM | POA: Diagnosis not present

## 2020-06-05 DIAGNOSIS — R4020 Unspecified coma: Secondary | ICD-10-CM | POA: Diagnosis not present

## 2020-06-05 DIAGNOSIS — D351 Benign neoplasm of parathyroid gland: Secondary | ICD-10-CM

## 2020-06-05 DIAGNOSIS — Z794 Long term (current) use of insulin: Secondary | ICD-10-CM | POA: Insufficient documentation

## 2020-06-05 DIAGNOSIS — Z20822 Contact with and (suspected) exposure to covid-19: Secondary | ICD-10-CM | POA: Insufficient documentation

## 2020-06-05 DIAGNOSIS — Z7982 Long term (current) use of aspirin: Secondary | ICD-10-CM | POA: Insufficient documentation

## 2020-06-05 DIAGNOSIS — I129 Hypertensive chronic kidney disease with stage 1 through stage 4 chronic kidney disease, or unspecified chronic kidney disease: Secondary | ICD-10-CM | POA: Insufficient documentation

## 2020-06-05 DIAGNOSIS — I4892 Unspecified atrial flutter: Secondary | ICD-10-CM | POA: Diagnosis not present

## 2020-06-05 DIAGNOSIS — IMO0002 Reserved for concepts with insufficient information to code with codable children: Secondary | ICD-10-CM

## 2020-06-05 DIAGNOSIS — R0902 Hypoxemia: Secondary | ICD-10-CM | POA: Diagnosis not present

## 2020-06-05 DIAGNOSIS — N1831 Chronic kidney disease, stage 3a: Secondary | ICD-10-CM | POA: Diagnosis not present

## 2020-06-05 DIAGNOSIS — Z79899 Other long term (current) drug therapy: Secondary | ICD-10-CM | POA: Insufficient documentation

## 2020-06-05 DIAGNOSIS — E1165 Type 2 diabetes mellitus with hyperglycemia: Secondary | ICD-10-CM

## 2020-06-05 DIAGNOSIS — R61 Generalized hyperhidrosis: Secondary | ICD-10-CM | POA: Diagnosis not present

## 2020-06-05 DIAGNOSIS — R4182 Altered mental status, unspecified: Secondary | ICD-10-CM | POA: Insufficient documentation

## 2020-06-05 DIAGNOSIS — H5461 Unqualified visual loss, right eye, normal vision left eye: Secondary | ICD-10-CM | POA: Insufficient documentation

## 2020-06-05 DIAGNOSIS — I959 Hypotension, unspecified: Secondary | ICD-10-CM | POA: Insufficient documentation

## 2020-06-05 DIAGNOSIS — E1122 Type 2 diabetes mellitus with diabetic chronic kidney disease: Secondary | ICD-10-CM | POA: Diagnosis not present

## 2020-06-05 DIAGNOSIS — R410 Disorientation, unspecified: Secondary | ICD-10-CM | POA: Diagnosis not present

## 2020-06-05 DIAGNOSIS — R519 Headache, unspecified: Secondary | ICD-10-CM | POA: Diagnosis not present

## 2020-06-05 LAB — CBC WITH DIFFERENTIAL/PLATELET
Abs Immature Granulocytes: 0.01 10*3/uL (ref 0.00–0.07)
Basophils Absolute: 0 10*3/uL (ref 0.0–0.1)
Basophils Relative: 0 %
Eosinophils Absolute: 0.2 10*3/uL (ref 0.0–0.5)
Eosinophils Relative: 3 %
HCT: 41.3 % (ref 39.0–52.0)
Hemoglobin: 12.8 g/dL — ABNORMAL LOW (ref 13.0–17.0)
Immature Granulocytes: 0 %
Lymphocytes Relative: 30 %
Lymphs Abs: 1.9 10*3/uL (ref 0.7–4.0)
MCH: 24.9 pg — ABNORMAL LOW (ref 26.0–34.0)
MCHC: 31 g/dL (ref 30.0–36.0)
MCV: 80.4 fL (ref 80.0–100.0)
Monocytes Absolute: 0.6 10*3/uL (ref 0.1–1.0)
Monocytes Relative: 9 %
Neutro Abs: 3.5 10*3/uL (ref 1.7–7.7)
Neutrophils Relative %: 58 %
Platelets: 115 10*3/uL — ABNORMAL LOW (ref 150–400)
RBC: 5.14 MIL/uL (ref 4.22–5.81)
RDW: 16.9 % — ABNORMAL HIGH (ref 11.5–15.5)
Smear Review: NORMAL
WBC: 6.2 10*3/uL (ref 4.0–10.5)
nRBC: 0 % (ref 0.0–0.2)

## 2020-06-05 LAB — COMPREHENSIVE METABOLIC PANEL
ALT: 28 U/L (ref 0–44)
AST: 26 U/L (ref 15–41)
Albumin: 3.5 g/dL (ref 3.5–5.0)
Alkaline Phosphatase: 93 U/L (ref 38–126)
Anion gap: 7 (ref 5–15)
BUN: 25 mg/dL — ABNORMAL HIGH (ref 8–23)
CO2: 30 mmol/L (ref 22–32)
Calcium: 10.4 mg/dL — ABNORMAL HIGH (ref 8.9–10.3)
Chloride: 102 mmol/L (ref 98–111)
Creatinine, Ser: 1.39 mg/dL — ABNORMAL HIGH (ref 0.61–1.24)
GFR calc Af Amer: 59 mL/min — ABNORMAL LOW (ref >60)
GFR calc non Af Amer: 51 mL/min — ABNORMAL LOW (ref >60)
Glucose, Bld: 179 mg/dL — ABNORMAL HIGH (ref 70–99)
Potassium: 3.4 mmol/L — ABNORMAL LOW (ref 3.5–5.1)
Sodium: 139 mmol/L (ref 135–145)
Total Bilirubin: 0.7 mg/dL (ref 0.3–1.2)
Total Protein: 6.6 g/dL (ref 6.5–8.1)

## 2020-06-05 LAB — GLUCOSE, CAPILLARY
Glucose-Capillary: 197 mg/dL — ABNORMAL HIGH (ref 70–99)
Glucose-Capillary: 282 mg/dL — ABNORMAL HIGH (ref 70–99)

## 2020-06-05 LAB — CK: Total CK: 153 U/L (ref 49–397)

## 2020-06-05 LAB — TROPONIN I (HIGH SENSITIVITY)
Troponin I (High Sensitivity): 7 ng/L (ref <18)
Troponin I (High Sensitivity): 6 ng/L (ref <18)

## 2020-06-05 LAB — TSH: TSH: 0.034 u[IU]/mL — ABNORMAL LOW (ref 0.350–4.500)

## 2020-06-05 LAB — LACTIC ACID, PLASMA
Lactic Acid, Venous: 1.9 mmol/L (ref 0.5–1.9)
Lactic Acid, Venous: 1.5 mmol/L (ref 0.5–1.9)

## 2020-06-05 LAB — SARS CORONAVIRUS 2 BY RT PCR (HOSPITAL ORDER, PERFORMED IN ~~LOC~~ HOSPITAL LAB): SARS Coronavirus 2: NEGATIVE

## 2020-06-05 MED ORDER — INSULIN ASPART 100 UNIT/ML ~~LOC~~ SOLN
0.0000 [IU] | Freq: Three times a day (TID) | SUBCUTANEOUS | Status: DC
  Administered 2020-06-06 (×2): 2 [IU] via SUBCUTANEOUS

## 2020-06-05 MED ORDER — METOPROLOL TARTRATE 50 MG PO TABS
100.0000 mg | ORAL_TABLET | Freq: Two times a day (BID) | ORAL | Status: DC
  Administered 2020-06-05 – 2020-06-06 (×2): 100 mg via ORAL
  Filled 2020-06-05 (×2): qty 2

## 2020-06-05 MED ORDER — OXYCODONE HCL ER 15 MG PO T12A
40.0000 mg | EXTENDED_RELEASE_TABLET | Freq: Two times a day (BID) | ORAL | Status: DC
  Administered 2020-06-05 – 2020-06-06 (×2): 40 mg via ORAL
  Filled 2020-06-05: qty 1
  Filled 2020-06-05: qty 4

## 2020-06-05 MED ORDER — MAGNESIUM OXIDE 400 (241.3 MG) MG PO TABS
400.0000 mg | ORAL_TABLET | Freq: Two times a day (BID) | ORAL | Status: DC
  Administered 2020-06-05 – 2020-06-06 (×2): 400 mg via ORAL
  Filled 2020-06-05 (×2): qty 1

## 2020-06-05 MED ORDER — ENOXAPARIN SODIUM 40 MG/0.4ML ~~LOC~~ SOLN
40.0000 mg | SUBCUTANEOUS | Status: DC
  Administered 2020-06-05: 40 mg via SUBCUTANEOUS
  Filled 2020-06-05: qty 0.4

## 2020-06-05 MED ORDER — VITAMIN D 25 MCG (1000 UNIT) PO TABS
1000.0000 [IU] | ORAL_TABLET | Freq: Every day | ORAL | Status: DC
  Administered 2020-06-06: 1000 [IU] via ORAL
  Filled 2020-06-05: qty 1

## 2020-06-05 MED ORDER — AMIODARONE HCL 200 MG PO TABS
200.0000 mg | ORAL_TABLET | Freq: Every day | ORAL | Status: DC
  Administered 2020-06-06: 200 mg via ORAL
  Filled 2020-06-05 (×2): qty 1

## 2020-06-05 MED ORDER — ROPINIROLE HCL 1 MG PO TABS
1.0000 mg | ORAL_TABLET | Freq: Two times a day (BID) | ORAL | Status: DC
  Administered 2020-06-06: 1 mg via ORAL
  Filled 2020-06-05: qty 1

## 2020-06-05 MED ORDER — INSULIN ASPART 100 UNIT/ML ~~LOC~~ SOLN
14.0000 [IU] | Freq: Three times a day (TID) | SUBCUTANEOUS | Status: DC
  Administered 2020-06-06 (×2): 14 [IU] via SUBCUTANEOUS
  Filled 2020-06-05 (×2): qty 1

## 2020-06-05 MED ORDER — ASPIRIN EC 81 MG PO TBEC
81.0000 mg | DELAYED_RELEASE_TABLET | Freq: Every day | ORAL | Status: DC
  Administered 2020-06-06: 81 mg via ORAL
  Filled 2020-06-05: qty 1

## 2020-06-05 MED ORDER — CYCLOBENZAPRINE HCL 10 MG PO TABS
10.0000 mg | ORAL_TABLET | Freq: Three times a day (TID) | ORAL | Status: DC
  Administered 2020-06-05 – 2020-06-06 (×3): 10 mg via ORAL
  Filled 2020-06-05 (×3): qty 1

## 2020-06-05 MED ORDER — DOCUSATE SODIUM 100 MG PO CAPS
200.0000 mg | ORAL_CAPSULE | Freq: Every day | ORAL | Status: DC
  Administered 2020-06-06: 200 mg via ORAL
  Filled 2020-06-05: qty 2

## 2020-06-05 MED ORDER — SUCRALFATE 1 G PO TABS
1.0000 g | ORAL_TABLET | Freq: Four times a day (QID) | ORAL | Status: DC
  Administered 2020-06-05 – 2020-06-06 (×3): 1 g via ORAL
  Filled 2020-06-05 (×3): qty 1

## 2020-06-05 MED ORDER — DUTASTERIDE 0.5 MG PO CAPS
0.5000 mg | ORAL_CAPSULE | Freq: Every day | ORAL | Status: DC
  Administered 2020-06-06: 0.5 mg via ORAL
  Filled 2020-06-05: qty 1

## 2020-06-05 MED ORDER — ONDANSETRON HCL 4 MG PO TABS: 4.0000 mg | ORAL_TABLET | Freq: Four times a day (QID) | ORAL | Status: DC | PRN

## 2020-06-05 MED ORDER — ACETAMINOPHEN 650 MG RE SUPP: 650.0000 mg | Freq: Four times a day (QID) | RECTAL | Status: DC | PRN

## 2020-06-05 MED ORDER — GABAPENTIN 100 MG PO CAPS
100.0000 mg | ORAL_CAPSULE | Freq: Two times a day (BID) | ORAL | Status: DC
  Administered 2020-06-06: 100 mg via ORAL
  Filled 2020-06-05: qty 1

## 2020-06-05 MED ORDER — ACETAMINOPHEN 325 MG PO TABS
650.0000 mg | ORAL_TABLET | Freq: Four times a day (QID) | ORAL | Status: DC | PRN
  Administered 2020-06-06: 650 mg via ORAL
  Filled 2020-06-05: qty 2

## 2020-06-05 MED ORDER — PREGABALIN 75 MG PO CAPS
200.0000 mg | ORAL_CAPSULE | Freq: Three times a day (TID) | ORAL | Status: DC
  Administered 2020-06-05 – 2020-06-06 (×3): 200 mg via ORAL
  Filled 2020-06-05: qty 2
  Filled 2020-06-05: qty 1
  Filled 2020-06-05: qty 2

## 2020-06-05 MED ORDER — FUROSEMIDE 40 MG PO TABS
40.0000 mg | ORAL_TABLET | Freq: Every day | ORAL | Status: DC
  Administered 2020-06-06: 40 mg via ORAL
  Filled 2020-06-05: qty 1

## 2020-06-05 MED ORDER — SODIUM CHLORIDE 0.9 % IV BOLUS
1000.0000 mL | Freq: Once | INTRAVENOUS | Status: AC
  Administered 2020-06-05: 1000 mL via INTRAVENOUS

## 2020-06-05 MED ORDER — HYDROCHLOROTHIAZIDE 25 MG PO TABS
25.0000 mg | ORAL_TABLET | Freq: Every day | ORAL | Status: DC
  Administered 2020-06-05: 25 mg via ORAL
  Filled 2020-06-05: qty 1

## 2020-06-05 MED ORDER — INSULIN GLARGINE 100 UNIT/ML ~~LOC~~ SOLN
38.0000 [IU] | Freq: Two times a day (BID) | SUBCUTANEOUS | Status: DC
  Administered 2020-06-06: 38 [IU] via SUBCUTANEOUS
  Filled 2020-06-05 (×3): qty 0.38

## 2020-06-05 MED ORDER — PANTOPRAZOLE SODIUM 40 MG PO TBEC
40.0000 mg | DELAYED_RELEASE_TABLET | Freq: Two times a day (BID) | ORAL | Status: DC
  Administered 2020-06-05 – 2020-06-06 (×2): 40 mg via ORAL
  Filled 2020-06-05 (×2): qty 1

## 2020-06-05 MED ORDER — ONDANSETRON HCL 4 MG/2ML IJ SOLN: 4.0000 mg | Freq: Four times a day (QID) | INTRAMUSCULAR | Status: DC | PRN

## 2020-06-05 NOTE — ED Notes (Signed)
Pt given dinner tray at this time per MD. This RN warmed up patient's food, patient denies further needs at this time. Will continue to monitor.

## 2020-06-05 NOTE — Patient Instructions (Addendum)
Dr.Cannon recommends patient to STOP Hydrochlorothiazide for two weeks.  Dr.Cannon recommends patient to have a 24-hr Urine Collection after stopping medication for two weeks.   Patient will have lab work done when he has stopped the Hydrochlorothiazide for two weeks.

## 2020-06-05 NOTE — ED Triage Notes (Signed)
Pt arrival via [facility] from car. EMS states that patient went to the doctor this morning to have his thyroid checked out but that they did not due any procedures. Pt states he stopped and got breakfast and that he remembers pulling in the drive way but doesn't remember anything else until EMS arrived.   EMS states that they found the patient in his car passed out and sweaty and that he had been there for at least 2 hours. Pt is a diabetic, sugar was 211. Pt was a&ox3 when aroused but is now a&ox4. EMS gave 500 bolus of fluids in route. Pt was 90% on RA but went up to 97% on 2 L. Ax temp 99.5. HR in aflutter which patient thinks he has a hx of.

## 2020-06-05 NOTE — H&P (Addendum)
History and Physical:    Jerry Parsons   EGB:[id number] DOB: 1951-03-08 DOA: 06/05/2020  Referring MD/provider: Conni Slipper, MD PCP: Jerrol Banana., MD   Patient coming from: Home  Chief Complaint: Loss of consciousness  History of Present Illness:   Jerry Parsons is an 69 y.o. male with medical history significant for remote history of childhood seizures (has not had a seizure since age 14) paroxysmal atrial fibrillation x2 (Second episode was in 2014 after back surgery) and has been off of anticoagulation for "many years", BPH, diabetes mellitus, history of DVT and PE, neuromuscular disorder, pneumonia, hypertension, CKD stage IIIa, multiple back surgeries.  Is brought to the hospital today after he was found in an unconscious state in his care.  The patient said he does not remember anything.  According to his wife, he went to see the general surgeon today for evaluation of hypercalcemia.  He did not sleep well last night because he was worried about the appointment.  Apparently, when he got home from the appointment, he opened the garage door and pulled up to the driveway.  He pulled the vehicle in the back and turned off the engine.  His wife thinks that he was probably exhausted and slept off in front of the steering wheel.  Patient has no complaints.  No chest pain, shortness of breath, dizziness, palpitations, cough, wheezing, headache, dizziness, unilateral weakness or numbness in extremities, changes in speech or vision.    ED Course: He was hypotensive in the ED and he was given 1 L of normal saline in the ED.  ROS:   ROS all other systems reviewed were negative  Past Medical History:   Past Medical History:  Diagnosis Date  . Arrhythmia    tachycardia, A-Fib  . Arthritis   . Blind right eye   . BPH (benign prostatic hyperplasia)   . Diabetes mellitus   . DVT (deep venous thrombosis) ([place]) 02/2010   leg thrombus ; dislodged into emboli and caused PE  .  Dyspnea   . Dysrhythmia   . 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 GERD (gastroesophageal reflux disease)   . Hypercholesteremia   . Hypertension   . Kidney failure   . Neuromuscular disorder ([place])   . Neuropathy   . Pneumonia    time 9 ;last episode 12/2015  . Pulmonary embolus ([place]) 2011  . Seasonal allergies   . Seizures ([place])    as child   . Sleep apnea    BIPAP  . Stiff neck    limited turning s/p titanium plate placement  . TIA (transient ischemic attack)   . Wears dentures    full upper and lower    Past Surgical History:   Past Surgical History:  Procedure Laterality Date  . APPENDECTOMY    . BACK SURGERY     x 8; upper x 3 & lower x 5  . CARDIOVERSION  03/14/13, 10/16   2014 - [place], 2016 - [place]  . CATARACT EXTRACTION W/PHACO Left 10/29/2015   Procedure: CATARACT EXTRACTION PHACO AND INTRAOCULAR LENS PLACEMENT (IOC);  Surgeon: Leandrew Koyanagi, MD;  Location: [facility];  Service: Ophthalmology;  Laterality: Left;  DIABETIC - insulin and oral medsSleep apnea - no machine  . CHOLECYSTECTOMY    . COLONOSCOPY WITH PROPOFOL N/A 01/16/2018   Procedure: COLONOSCOPY WITH PROPOFOL;  Surgeon: Manya Silvas, MD;  [facility] ENDOSCOPY;  Service: Endoscopy;  Laterality: N/A;  .  ESOPHAGOGASTRODUODENOSCOPY (EGD) WITH PROPOFOL N/A 01/16/2018   Procedure: ESOPHAGOGASTRODUODENOSCOPY (EGD) WITH PROPOFOL;  Surgeon: Manya Silvas, MD;  Location: [facility] END[facility]COPY;  Service: Endoscopy;  Laterality: N/A;  . EYE SURGERY    . GALLBLADDER SURGERY  2002  . JOINT REPLACEMENT Right 2018  . KNEE ARTHROSCOPY     left   . ROTATOR CUFF REPAIR  2001   left   . TEE WITHOUT CARDIOVERSION N/A 04/26/2018   Procedure: TRANSESOPHAGEAL ECHOCARDIOGRAM (TEE);  Surgeon: Nelva Bush, MD;  Location: [facility] ORS;  Service: Cardiovascular;  Laterality: N/A;  . TONSILLECTOMY    . TOTAL KNEE ARTHROPLASTY Right 06/20/2017   Procedure: RIGHT TOTAL KNEE ARTHROPLASTY;  Surgeon:  Gaynelle Arabian, MD;  Location: [facility] ORS;  Service: Orthopedics;  Laterality: Right;    Social History:   Social History   Socioeconomic History  . Marital status: Married    Spouse name: Not on file  . Number of children: 1  . Years of education: Not on file  . Highest education level: 6th grade  Occupational History  . Occupation: retired  Tobacco Use  . Smoking status: Former Smoker    Packs/day: 2.00    Years: 20.00    Pack years: 40.00    Types: Cigarettes    Quit date: 12/26/1989    Years since quitting: 30.4  . Smokeless tobacco: Never Used  Vaping Use  . Vaping Use: Never used  Substance and Sexual Activity  . Alcohol use: No  . Drug use: No  . Sexual activity: Not on file  Other Topics Concern  . Not on file  Social History Narrative  . Not on file   Social Determinants of Health   Financial Resource Strain:   . Difficulty of Paying Living Expenses:   Food Insecurity:   . Worried About Charity fundraiser in the Last Year:   . Arboriculturist in the Last Year:   Transportation Needs:   . Film/video editor (Medical):   [name] Lack of Transportation (Non-Medical):   Physical Activity: Inactive  . Days of Exercise per Week: 0 days  . Minutes of Exercise per Session: 0 min  Stress: No Stress Concern Present  . Feeling of Stress : Not at all  Social Connections:   . Frequency of Communication with Friends and Family:   . Frequency of Social Gatherings with Friends and Family:   . Attends Religious Services:   . Active Member of Clubs or Organizations:   . Attends Archivist Meetings:   [name] Marital Status:   Intimate Partner Violence:   . Fear of Current or Ex-Partner:   . Emotionally Abused:   [name] Physically Abused:   . Sexually Abused:     Allergies   Carisoprodol and Tamsulosin  Family history:   Family History  Problem Relation Age of Onset  . Heart attack Brother     Current Medications:   Prior to Admission medications    Medication Sig Start Date End Date Taking? Authorizing Provider  aluminum chloride (DRYSOL) 20 % external solution Apply topically at bedtime. Patient not taking: Reported on 06/05/2020 10/23/19   Landis Martins, DPM  amiodarone (PACERONE) 200 MG tablet TAKE 1 TABLET BY MOUTH EVERY DAY 05/19/20   Minna Merritts, MD  amoxicillin-clavulanate (AUGMENTIN) 875-125 MG tablet Take 1 tablet by mouth 2 (two) times daily. Patient not taking: Reported on 06/05/2020 05/19/20   Jerrol Banana., MD  aspirin 81 MG tablet Take 81 mg by  mouth daily.    [provider]  cholecalciferol (VITAMIN D3) 25 MCG (1000 UT) tablet Take 1,000 Units by mouth daily.    [provider]  cyclobenzaprine (FLEXERIL) 10 MG tablet TAKE 1 TABLET BY MOUTH 3 TIMES A DAY 07/14/19   Jerrol Banana., MD  docusate sodium (COLACE) 250 MG capsule Take by mouth.    [provider]  Docusate Sodium (STOOL SOFTENER LAXATIVE PO) Take by mouth.    [provider]  dutasteride (AVODART) 0.5 MG capsule TAKE 1 CAPSULE DAILY 06/27/19   Jerrol Banana., MD  esomeprazole (NEXIUM) 40 MG capsule Take 1 capsule (40 mg total) by mouth 2 (two) times daily. 01/31/20   Jerrol Banana., MD  fexofenadine (ALLEGRA) 180 MG tablet Take 180 mg by mouth daily.      [provider]  furosemide (LASIX) 40 MG tablet TAKE 1 TABLET (40 MG) BY MOUTH ONCE DAILY, YOU MAY TAKE 1 EXTRA TABLET (40 MG) AFTER LUNCH AS NEEDED FOR LEG SWELLING/ ABDOMINAL TIGHTNESS 05/08/20   Jerrol Banana., MD  gabapentin (NEURONTIN) 100 MG capsule TAKE 1 CAPSULE BY MOUTH TWICE A DAY 09/10/19   Jerrol Banana., MD  hydrochlorothiazide (HYDRODIURIL) 12.5 MG tablet TAKE 2 TABLETS (25 MG TOTAL) BY MOUTH AT BEDTIME. 03/08/20   Jerrol Banana., MD  insulin aspart (NOVOLOG FLEXPEN) 100 UNIT/ML FlexPen Inject 12 Units into the skin 3 (three) times daily with meals. Patient taking differently: Inject 14 Units into the  skin 3 (three) times daily with meals.  05/07/19   Jerrol Banana., MD  Insulin Pen Needle (PEN NEEDLES) 31G X 5 MM MISC 1 each by Does not apply route daily. PATIENT NEEDS NOVA TWIST NEEDLES 5 MM  DX E11.9 10/18/16   Jerrol Banana., MD  JANUVIA 100 MG tablet TAKE 1 TABLET DAILY 09/26/19   Jerrol Banana., MD  LANTUS SOLOSTAR 100 UNIT/ML Solostar Pen INJECT 35 UNITS INTO THE   SKIN TWO TIMES A DAY 11/19/19   Jerrol Banana., MD  liraglutide (VICTOZA) 18 MG/3ML SOPN INJECT 0.3ML (=1.8MG )      SUBCUTANEOUSLY DAILY 11/19/19   Jerrol Banana., MD  magnesium oxide (MAG-OX) 400 MG tablet TAKE 1 TABLET (400 MG TOTAL) BY MOUTH 2 (TWO) TIMES DAILY. 05/19/20   Jerrol Banana., MD  metoprolol tartrate (LOPRESSOR) 100 MG tablet TAKE 1 TABLET (100 MG TOTAL) BY MOUTH 2 (TWO) TIMES DAILY. 02/26/20   Jerrol Banana., MD  Multiple Vitamins-Minerals (CENTRUM SILVER 50+MEN) TABS Take by mouth daily.    [provider]  NOVOTWIST 32G X 5 MM MISC USE 6 TIMES A DAY DX E11.9 01/05/20   Jerrol Banana., MD  Omega-3 Fatty Acids (FISH OIL) 1200 MG CAPS Take 1,200 mg by mouth 2 (two) times daily.     [provider]  [facility] ULTRA test strip USE WITH METER TWICE DAILY 05/11/20   [provider]  oxyCODONE (OXYCONTIN) 40 mg 12 hr tablet Take 1 tablet (40 mg total) by mouth every 8 (eight) hours as needed. 05/12/20   Jerrol Banana., MD  pregabalin (LYRICA) 200 MG capsule TAKE 1 CAPSULE 3 TIMES A   DAY 11/07/19   Jerrol Banana., MD  rOPINIRole (REQUIP) 1 MG tablet Take 1 mg by mouth 2 (two) times daily. 05/27/20   [provider]  rOPINIRole (REQUIP) 2 MG tablet Take 1  tablet twice a day as needed. 03/10/20   Jerrol Banana., MD  sucralfate (CARAFATE) 1 g tablet TAKE 1 TABLET BY MOUTH 4 (FOUR) TIMES DAILY - WITH MEALS AND AT BEDTIME. 04/29/20   Jerrol Banana., MD  testosterone cypionate (DEPOTESTOSTERONE CYPIONATE) 200  MG/ML injection Inject 200 mg into the muscle every 21 ( twenty-one) days.  05/02/18   [provider]  Vitamin D, Ergocalciferol, (DRISDOL) 1.25 MG (50000 UT) CAPS capsule Take by mouth.    [provider]    Physical Exam:   Vitals:   06/05/20 1629 06/05/20 1659 06/05/20 1730 06/05/20 1759  BP:   (!) 121/59   Pulse: 62 61 62 61  Resp: 10 10 (!) 9 10  Temp:      SpO2: 91% 92% 94% 96%  Weight:      Height:         Physical Exam: Blood pressure (!) 121/59, pulse 61, temperature 98.1 F (36.7 C), resp. rate 10, height 5\' 10"  (1.778 m), weight 119.3 kg, SpO2 96 %. Gen: No acute distress. Head: Normocephalic, atraumatic. Eyes: Pupils equal, round and reactive to light. Extraocular movements intact.  Sclerae nonicteric.  Mouth: Moist mucous membranes Neck: Supple, no thyromegaly, no lymphadenopathy, no jugular venous distention. Chest: Lungs are clear to auscultation with good air movement. No rales, rhonchi or wheezes CV: Heart sounds are regular with an S1, S2. No murmurs, rubs or gallops.  Abdomen: Soft, nontender, nondistended with normal active bowel sounds. No palpable masses. Extremities: Extremities are without clubbing, or cyanosis.  2+ bilateral leg edema, no tenderness. Skin: Warm and dry. No rashes, lesions or wounds Neuro: Alert and oriented times 3; grossly nonfocal.  Psych: Insight is good and judgment is appropriate. Mood and affect normal.   Data Review:    Labs: Basic Metabolic Panel: Recent Labs  Lab 06/05/20 1444  NA 139  K 3.4*  CL 102  CO2 30  GLUCOSE 179*  BUN 25*  CREATININE 1.39*  CALCIUM 10.4*   Liver Function Tests: Recent Labs  Lab 06/05/20 1444  AST 26  ALT 28  ALKPHOS 93  BILITOT 0.7  PROT 6.6  ALBUMIN 3.5   No results for input(s): LIPASE, AMYLASE in the last 168 hours. No results for input(s): AMMONIA in the last 168 hours. CBC: Recent Labs  Lab 06/05/20 1444  WBC 6.2  NEUTROABS 3.5  HGB 12.8*  HCT 41.3   MCV 80.4  PLT 115*   Cardiac Enzymes: Recent Labs  Lab 06/05/20 1444  CKTOTAL 153    BNP (last 3 results) No results for input(s): PROBNP in the last 8760 hours. CBG: Recent Labs  Lab 06/05/20 1451  GLUCAP 197*    Urinalysis    Component Value Date/Time   COLORURINE YELLOW (A) 04/22/2018 0255   APPEARANCEUR CLEAR (A) 04/22/2018 0255   APPEARANCEUR Clear 04/12/2013 1256   LABSPEC 1.016 04/22/2018 0255   LABSPEC 1.015 04/12/2013 1256   PHURINE 5.0 04/22/2018 0255   GLUCOSEU >=500 (A) 04/22/2018 0255   GLUCOSEU >=500 04/12/2013 1256   HGBUR NEGATIVE 04/22/2018 0255   BILIRUBINUR NEGATIVE 04/22/2018 0255   BILIRUBINUR neg 07/13/2017 1144   BILIRUBINUR Negative 04/12/2013 1256   KETONESUR 5 (A) 04/22/2018 0255   PROTEINUR NEGATIVE 04/22/2018 0255   UROBILINOGEN 0.2 07/13/2017 1144   UROBILINOGEN 0.2 02/05/2010 1041   NITRITE NEGATIVE 04/22/2018 0255   LEUKOCYTESUR NEGATIVE 04/22/2018 0255   LEUKOCYTESUR Negative 04/12/2013 1256      Radiographic Studies: CT  Head Wo Contrast  Result Date: 06/05/2020 CLINICAL DATA:  Syncope. EXAM: CT HEAD WITHOUT CONTRAST TECHNIQUE: Contiguous axial images were obtained from the base of the skull through the vertex without intravenous contrast. COMPARISON:  September 08, 2015 FINDINGS: Brain: There is mild cerebral atrophy with widening of the extra-axial spaces and ventricular dilatation. There are areas of decreased attenuation within the white matter tracts of the supratentorial brain, consistent with microvascular disease changes. A stable area of curvilinear chronic cortical calcification is seen within the posterior parietal region on the left. Vascular: No hyperdense vessels are identified. Skull: Normal. Negative for fracture or focal lesion. Sinuses/Orbits: No acute finding. Other: None. IMPRESSION: 1. Generalized cerebral atrophy. 2. No acute intracranial abnormality. Electronically Signed   By: Virgina Norfolk M.D.   On:  06/05/2020 16:13   DG Chest Portable 1 View  Result Date: 06/05/2020 CLINICAL DATA:  Diaphoresis and syncope today. EXAM: PORTABLE CHEST 1 VIEW COMPARISON:  PA and lateral chest 06/06/2018. FINDINGS: Lungs clear. Heart size upper normal. No pneumothorax or pleural fluid. No acute or focal bony abnormality. IMPRESSION: No acute disease. Electronically Signed   By: Inge Rise M.D.   On: 06/05/2020 15:21       Assessment/Plan:   Active Problems:   Loss of consciousness ([place])   Probable loss of consciousness versus sleep deprivation: Admit to MedSurg and monitor on telemetry.  Patient has remote history of seizures and paroxysmal atrial fibrillation.  Obtain 2D echo and MRI brain for further evaluation.  Hypotension: Improved with IV fluids.  Hold antihypertensives for now.  Hypercalcemia: Outpatient follow-up with general surgeon.  He has been told to avoid HCTZ for 2 weeks for 24-hour urinary calcium testing.  Paroxysmal atrial fibrillation: Last known atrial fibrillation was in 2014.  He said he has been taken off of anticoagulants because apparently he no longer needed it.  Aspirin was prescribed.  Continue aspirin.  Insulin-dependent diabetes mellitus: Continue Lantus.  NovoLog as needed for hyperglycemia.  CKD stage IIIa: Creatinine is stable.  Remote history of childhood seizures: No seizures since age 14.     Body mass index is 37.74 kg/m.  Other information:   DVT prophylaxis: Lovenox Code Status: Full code. Family Communication: Plan discussed with his wife at the bedside Disposition Plan: Possible discharge to home tomorrow Consults called: None Admission status: Observation   The medical decision making is of moderate complexity, therefore this is a level 2 visit   [facility]   How to contact the [facility] Attending or Consulting provider [place] or covering provider during after hours [place], for this patient?   1. Check the care  team in [facility] and look for a) attending/consulting [facility] provider listed and b) the [facility] team listed 2. Log into [email or web address] and use [place]'s universal password to access. If you do not have the password, please contact the hospital operator. 3. Locate the [facility] provider you are looking for under [facility] and page to a number that you can be directly reached. 4. If you still have difficulty reaching the provider, please page the [facility] (Director on Call) for the Hospitalists listed on amion for assistance.  06/05/2020, 6:06 PM

## 2020-06-05 NOTE — ED Notes (Signed)
Pt transported to CT ?

## 2020-06-05 NOTE — ED Provider Notes (Signed)
[facility] Emergency Department Provider Note   ____________________________________________   First MD Initiated Contact with Patient 06/05/20 1442     (approximate)  I have reviewed the triage vital signs and the nursing notes.   HISTORY  Chief Complaint Loss of Consciousness and Altered Mental Status    HPI NEZAR BUCKLES is a 69 y.o. male who gives me the same history as the triage nurse got.  He had gone to see his doctor to check on his thyroid told me he was going home pulled them to the driveway and that the last thing he remembers.  He was there approximately 2 hours.  Pulse ox and axillary temperatures as documented in triage note.  Patient is now awake alert and oriented x3 denies any headache nausea vomiting chest pain shortness of breath bellyache or any other complaints.  He has chronic leg edema.  He does have atrial flutter.         Past Medical History:  Diagnosis Date  . Arrhythmia    tachycardia, A-Fib  . Arthritis   . Blind right eye   . BPH (benign prostatic hyperplasia)   . Diabetes mellitus   . DVT (deep venous thrombosis) ([place]) 02/2010   leg thrombus ; dislodged into emboli and caused PE  . Dyspnea   . Dysrhythmia   . 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 GERD (gastroesophageal reflux disease)   . Hypercholesteremia   . Hypertension   . Kidney failure   . Neuromuscular disorder ([place])   . Neuropathy   . Pneumonia    time 9 ;last episode 12/2015  . Pulmonary embolus ([place]) 2011  . Seasonal allergies   . Seizures ([place])    as child   . Sleep apnea    BIPAP  . Stiff neck    limited turning s/p titanium plate placement  . TIA (transient ischemic attack)   . Wears dentures    full upper and lower    Patient Active Problem List   Diagnosis Date Noted  . Osteoarthritis of both knees 11/11/2018  . OSA on CPAP 10/14/2018  . MRSA bacteremia 05/19/2018  . Lumbar spondylosis 05/05/2018  . Spinal  stenosis of lumbar region 05/05/2018  . Constipation, chronic 03/08/2018  . Pain in limb 03/03/2018  . Gastroesophageal reflux disease without esophagitis 01/04/2018  . OA (osteoarthritis) of knee 06/20/2017  . Sepsis due to pneumonia ([place]) 05/07/2017  . Neuropathy 10/13/2016  . Amblyopia 12/30/2015  . 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 NS (nuclear sclerosis) 12/30/2015  . Pseudoaphakia 12/30/2015  . Dehydration 09/08/2015  . Aspiration pneumonia ([place]) 07/04/2015  . Paroxysmal atrial fibrillation ([place]) 07/04/2015  . Arthritis of knee, degenerative 05/28/2015  . Chronic back pain 09/17/2014  . Leg edema 05/07/2014  . Chronic diastolic CHF (congestive heart failure) ([place]) 05/07/2014  . Campylobacter diarrhea 04/25/2013  . Atrial flutter ([place]) 01/23/2013  . Obesity 05/19/2012  . Hyperlipidemia 11/11/2011  . Diastolic dysfunction 81/19/1478  . SOB (shortness of breath) 04/12/2011  . Diabetes mellitus type 2, uncontrolled, with complications ([place]) 29/56/2130  . HYPERTENSION, BENIGN 03/15/2011  . DVT 03/15/2011  . TACHYCARDIA 03/15/2011    Past Surgical History:  Procedure Laterality Date  . APPENDECTOMY    . BACK SURGERY     x 8; upper x 3 & lower x 5  . CARDIOVERSION  03/14/13, 10/16   2014 - [place], 2016 - [place]  . CATARACT EXTRACTION W/PHACO Left 10/29/2015  Procedure: CATARACT EXTRACTION PHACO AND INTRAOCULAR LENS PLACEMENT (IOC);  Surgeon: Leandrew Koyanagi, MD;  Location: [facility];  Service: Ophthalmology;  Laterality: Left;  DIABETIC - insulin and oral medsSleep apnea - no machine  . CHOLECYSTECTOMY    . COLONOSCOPY WITH PROPOFOL N/A 01/16/2018   Procedure: COLONOSCOPY WITH PROPOFOL;  Surgeon: Manya Silvas, MD;  Location: [facility] ENDOSCOPY;  Service: Endoscopy;  Laterality: N/A;  . ESOPHAGOGASTRODUODENOSCOPY (EGD) WITH PROPOFOL N/A 01/16/2018   Procedure: ESOPHAGOGASTRODUODENOSCOPY (EGD) WITH PROPOFOL;  Surgeon: Manya Silvas, MD;  Location: [facility] ENDOSCOPY;  Service:  Endoscopy;  Laterality: N/A;  . EYE SURGERY    . GALLBLADDER SURGERY  2002  . JOINT REPLACEMENT Right 2018  . KNEE ARTHROSCOPY     left   . ROTATOR CUFF REPAIR  2001   left   . TEE WITHOUT CARDIOVERSION N/A 04/26/2018   Procedure: TRANSESOPHAGEAL ECHOCARDIOGRAM (TEE);  Surgeon: Nelva Bush, MD;  Location: [facility] ORS;  Service: Cardiovascular;  Laterality: N/A;  . TONSILLECTOMY    . TOTAL KNEE ARTHROPLASTY Right 06/20/2017   Procedure: RIGHT TOTAL KNEE ARTHROPLASTY;  Surgeon: Gaynelle Arabian, MD;  Location: [facility] ORS;  Service: Orthopedics;  Laterality: Right;    Prior to Admission medications   Medication Sig Start Date End Date Taking? Authorizing Provider  aluminum chloride (DRYSOL) 20 % external solution Apply topically at bedtime. Patient not taking: Reported on 06/05/2020 10/23/19   Landis Martins, DPM  amiodarone (PACERONE) 200 MG tablet TAKE 1 TABLET BY MOUTH EVERY DAY 05/19/20   Minna Merritts, MD  amoxicillin-clavulanate (AUGMENTIN) 875-125 MG tablet Take 1 tablet by mouth 2 (two) times daily. Patient not taking: Reported on 06/05/2020 05/19/20   Jerrol Banana., MD  aspirin 81 MG tablet Take 81 mg by mouth daily.    [provider]  cholecalciferol (VITAMIN D3) 25 MCG (1000 UT) tablet Take 1,000 Units by mouth daily.    [provider]  cyclobenzaprine (FLEXERIL) 10 MG tablet TAKE 1 TABLET BY MOUTH 3 TIMES A DAY 07/14/19   Jerrol Banana., MD  docusate sodium (COLACE) 250 MG capsule Take by mouth.    [provider]  Docusate Sodium (STOOL SOFTENER LAXATIVE PO) Take by mouth.    [provider]  dutasteride (AVODART) 0.5 MG capsule TAKE 1 CAPSULE DAILY 06/27/19   Jerrol Banana., MD  esomeprazole (NEXIUM) 40 MG capsule Take 1 capsule (40 mg total) by mouth 2 (two) times daily. 01/31/20   Jerrol Banana., MD  fexofenadine (ALLEGRA) 180 MG tablet Take 180 mg by mouth daily.      [provider]  furosemide (LASIX)  40 MG tablet TAKE 1 TABLET (40 MG) BY MOUTH ONCE DAILY, YOU MAY TAKE 1 EXTRA TABLET (40 MG) AFTER LUNCH AS NEEDED FOR LEG SWELLING/ ABDOMINAL TIGHTNESS 05/08/20   Jerrol Banana., MD  gabapentin (NEURONTIN) 100 MG capsule TAKE 1 CAPSULE BY MOUTH TWICE A DAY 09/10/19   Jerrol Banana., MD  hydrochlorothiazide (HYDRODIURIL) 12.5 MG tablet TAKE 2 TABLETS (25 MG TOTAL) BY MOUTH AT BEDTIME. 03/08/20   Jerrol Banana., MD  insulin aspart (NOVOLOG FLEXPEN) 100 UNIT/ML FlexPen Inject 12 Units into the skin 3 (three) times daily with meals. Patient taking differently: Inject 14 Units into the skin 3 (three) times daily with meals.  05/07/19   Jerrol Banana., MD  Insulin Pen Needle (PEN NEEDLES) 31G X 5 MM MISC 1 each by Does not apply route  daily. PATIENT NEEDS NOVA TWIST NEEDLES 5 MM  DX E11.9 10/18/16   Jerrol Banana., MD  JANUVIA 100 MG tablet TAKE 1 TABLET DAILY 09/26/19   Jerrol Banana., MD  LANTUS SOLOSTAR 100 UNIT/ML Solostar Pen INJECT 35 UNITS INTO THE   SKIN TWO TIMES A DAY 11/19/19   Jerrol Banana., MD  liraglutide (VICTOZA) 18 MG/3ML SOPN INJECT 0.3ML (=1.8MG )      SUBCUTANEOUSLY DAILY 11/19/19   Jerrol Banana., MD  magnesium oxide (MAG-OX) 400 MG tablet TAKE 1 TABLET (400 MG TOTAL) BY MOUTH 2 (TWO) TIMES DAILY. 05/19/20   Jerrol Banana., MD  metoprolol tartrate (LOPRESSOR) 100 MG tablet TAKE 1 TABLET (100 MG TOTAL) BY MOUTH 2 (TWO) TIMES DAILY. 02/26/20   Jerrol Banana., MD  Multiple Vitamins-Minerals (CENTRUM SILVER 50+MEN) TABS Take by mouth daily.    [provider]  NOVOTWIST 32G X 5 MM MISC USE 6 TIMES A DAY DX E11.9 01/05/20   Jerrol Banana., MD  Omega-3 Fatty Acids (FISH OIL) 1200 MG CAPS Take 1,200 mg by mouth 2 (two) times daily.     [provider]  [facility] ULTRA test strip USE WITH METER TWICE DAILY 05/11/20   [provider]  oxyCODONE (OXYCONTIN) 40 mg 12 hr tablet Take 1 tablet (40  mg total) by mouth every 8 (eight) hours as needed. 05/12/20   Jerrol Banana., MD  pregabalin (LYRICA) 200 MG capsule TAKE 1 CAPSULE 3 TIMES A   DAY 11/07/19   Jerrol Banana., MD  rOPINIRole (REQUIP) 1 MG tablet Take 1 mg by mouth 2 (two) times daily. 05/27/20   [provider]  rOPINIRole (REQUIP) 2 MG tablet Take 1 tablet twice a day as needed. 03/10/20   Jerrol Banana., MD  sucralfate (CARAFATE) 1 g tablet TAKE 1 TABLET BY MOUTH 4 (FOUR) TIMES DAILY - WITH MEALS AND AT BEDTIME. 04/29/20   Jerrol Banana., MD  testosterone cypionate (DEPOTESTOSTERONE CYPIONATE) 200 MG/ML injection Inject 200 mg into the muscle every 21 ( twenty-one) days.  05/02/18   [provider]  Vitamin D, Ergocalciferol, (DRISDOL) 1.25 MG (50000 UT) CAPS capsule Take by mouth.    [provider]    Allergies Carisoprodol and Tamsulosin  Family History  Problem Relation Age of Onset  . Heart attack Brother     Social History Social History   Tobacco Use  . Smoking status: Former Smoker    Packs/day: 2.00    Years: 20.00    Pack years: 40.00    Types: Cigarettes    Quit date: 12/26/1989    Years since quitting: 30.4  . Smokeless tobacco: Never Used  Vaping Use  . Vaping Use: Never used  Substance Use Topics  . Alcohol use: No  . Drug use: No    Review of Systems  Constitutional: No fever/chills Eyes: No visual changes. ENT: No sore throat. Cardiovascular: Denies chest pain. Respiratory: Denies shortness of breath. Gastrointestinal: No abdominal pain.  No nausea, no vomiting.  No diarrhea.  No constipation. Genitourinary: Negative for dysuria. Musculoskeletal: Negative for back pain. Skin: Negative for rash. Neurological: Negative for headaches, focal weakness or numbness.  ____________________________________________   PHYSICAL EXAM:  VITAL SIGNS: ED Triage Vitals  Enc Vitals Group     BP 06/05/20 1440 124/65     Pulse Rate 06/05/20 1440  83     Resp 06/05/20 1440 20  Temp 06/05/20 1440 98.1 F (36.7 C)     Temp src --      SpO2 06/05/20 1440 93 %     Weight 06/05/20 1437 263 lb (119.3 kg)     Height 06/05/20 1437 5\' 10"  (1.778 m)     Head Circumference --      Peak Flow --      Pain Score 06/05/20 1437 7     Pain Loc --      Pain Edu? --      Excl. in [place]? --     Constitutional: Alert and oriented. Well appearing and in no acute distress. Eyes: Conjunctivae are normal. PER EOMI. Head: Atraumatic. Nose: No congestion/rhinnorhea. Mouth/Throat: Mucous membranes are moist.  Oropharynx non-erythematous. Neck: No stridor. Cardiovascular: Normal rate irregular rhythm. Grossly normal heart sounds.  Good peripheral circulation. Respiratory: Normal respiratory effort.  No retractions. Lungs CTAB. Gastrointestinal: Soft and nontender. No distention. No abdominal bruits. No CVA tenderness. Musculoskeletal: No lower extremity tenderness bilateral 1+ edema.  No joint effusions. Neurologic:  Normal speech and language. No gross focal neurologic deficits are appreciated.  Skin:  Skin is warm, dry and intact. No rash noted. Psychiatric: Mood and affect are normal. Speech and behavior are normal.  ____________________________________________   LABS (all labs ordered are listed, but only abnormal results are displayed)  Labs Reviewed  CBC WITH DIFFERENTIAL/PLATELET - Abnormal; Notable for the following components:      Result Value   Hemoglobin 12.8 (*)    MCH 24.9 (*)    RDW 16.9 (*)    Platelets 115 (*)    All other components within normal limits  GLUCOSE, CAPILLARY - Abnormal; Notable for the following components:   Glucose-Capillary 197 (*)    All other components within normal limits  LACTIC ACID, PLASMA  URINALYSIS, COMPLETE (UACMP) WITH MICROSCOPIC  COMPREHENSIVE METABOLIC PANEL  LACTIC ACID, PLASMA  TSH  URINE DRUG SCREEN, QUALITATIVE ([facility] ONLY)  CK  CBG MONITORING, ED  TROPONIN I (HIGH SENSITIVITY)    ____________________________________________  EKG   ____________________________________________  RADIOLOGY  ED MD interpretation:  Official radiology report(s): DG Chest Portable 1 View  Result Date: 06/05/2020 CLINICAL DATA:  Diaphoresis and syncope today. EXAM: PORTABLE CHEST 1 VIEW COMPARISON:  PA and lateral chest 06/06/2018. FINDINGS: Lungs clear. Heart size upper normal. No pneumothorax or pleural fluid. No acute or focal bony abnormality. IMPRESSION: No acute disease. Electronically Signed   By: Inge Rise M.D.   On: 06/05/2020 15:21    ____________________________________________   PROCEDURES  Procedure(s) performed (including Critical Care):  Procedures   ____________________________________________   INITIAL IMPRESSION / ASSESSMENT AND PLAN / ED COURSE  Labs CT chest x-ray are all pending.  Patient passed out while sitting down.  He then spent 2 hours making in a hot car.  We will have to get him in the hospital unless he has to be flown out.              ____________________________________________   FINAL CLINICAL IMPRESSION(S) / ED DIAGNOSES  Final diagnoses:  Syncope, unspecified syncope type     ED Discharge Orders    None       Note:  This document was prepared using Dragon voice recognition software and may include unintentional dictation errors.    Nena Polio, MD 06/05/20 1530

## 2020-06-05 NOTE — ED Notes (Signed)
Pt transported to MRI. MRI agrees to take patient to the floor when finished.

## 2020-06-05 NOTE — Progress Notes (Signed)
Patient ID: Jerry Parsons, male   DOB: 07/02/1951, 69 y.o.   MRN: [id number]  Chief Complaint  Patient presents with  . New Patient (Initial Visit)     new pt ref Dr.Gilbert parathyroid adenoma mailed nppw    HPI Jerry Parsons is a 69 y.o. male.  He has been referred by his primary care provider, Dr. Miguel Aschoff, MD, for further evaluation of hypercalcemia.  [name] states that he was found to have elevated calcium levels on routine blood work.  This was repeated, in conjunction with an intact PTH level.  The calcium was elevated and the PTH was inappropriately normal.  Serum protein electrophoresis with also performed which was negative for any monoclonal abnormalities.  His vitamin D level was normal.  He is here for evaluation of possible hyperparathyroidism.  [name] reports that he has had one episode of nephrolithiasis.  He has chronic constipation.  He has never had pancreatitis.  He does endorse both bone and joint pain.  He has sleep disturbances, specifically difficulty staying asleep at night.  He endorses polydipsia as well as ice pica.  He experiences approximately 1 episode of nocturia on a regular basis.  He endorses foggy thinking, fatigue, and memory concerns.  He states that his father had trouble with kidney stones.  He denies any family history of parathyroid disease, hypercalcemia, jaw tumors, pituitary, pancreatic, or adrenal tumors.  He is not certain whether he experienced any occupational exposure to ionizing radiation, but denies any therapeutic head or neck irradiation.  Of note, he does take hydrochlorothiazide on a regular basis.  Labs demonstrating hypercalcemia were obtained while using hydrochlorothiazide.  He occasionally will use Tums for indigestion, but does not take them regularly.   Past Medical History:  Diagnosis Date  . Arrhythmia    tachycardia, A-Fib  . Arthritis   . Blind right eye   . BPH (benign prostatic hyperplasia)   . Diabetes  mellitus   . DVT (deep venous thrombosis) ([place]) 02/2010   leg thrombus ; dislodged into emboli and caused PE  . Dyspnea   . Dysrhythmia   . 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 GERD (gastroesophageal reflux disease)   . Hypercholesteremia   . Hypertension   . Kidney failure   . Neuromuscular disorder ([place])   . Neuropathy   . Pneumonia    time 9 ;last episode 12/2015  . Pulmonary embolus ([place]) 2011  . Seasonal allergies   . Seizures ([place])    as child   . Sleep apnea    BIPAP  . Stiff neck    limited turning s/p titanium plate placement  . TIA (transient ischemic attack)   . Wears dentures    full upper and lower    Past Surgical History:  Procedure Laterality Date  . APPENDECTOMY    . BACK SURGERY     x 8; upper x 3 & lower x 5  . CARDIOVERSION  03/14/13, 10/16   2014 - [place], 2016 - [place]  . CATARACT EXTRACTION W/PHACO Left 10/29/2015   Procedure: CATARACT EXTRACTION PHACO AND INTRAOCULAR LENS PLACEMENT (IOC);  Surgeon: Leandrew Koyanagi, MD;  Location: [facility];  Service: Ophthalmology;  Laterality: Left;  DIABETIC - insulin and oral medsSleep apnea - no machine  . CHOLECYSTECTOMY    . COLONOSCOPY WITH PROPOFOL N/A 01/16/2018   Procedure: COLONOSCOPY WITH PROPOFOL;  Surgeon: Manya Silvas, MD;  Location: [facility] ENDOSCOPY;  Service: Endoscopy;  Laterality: N/A;  . ESOPHAGOGASTRODUODENOSCOPY (EGD) WITH PROPOFOL N/A 01/16/2018   Procedure: ESOPHAGOGASTRODUODENOSCOPY (EGD) WITH PROPOFOL;  Surgeon: Manya Silvas, MD;  Location: [facility] ENDOSCOPY;  Service: Endoscopy;  Laterality: N/A;  . EYE SURGERY    . GALLBLADDER SURGERY  2002  . JOINT REPLACEMENT Right 2018  . KNEE ARTHROSCOPY     left   . ROTATOR CUFF REPAIR  2001   left   . TEE WITHOUT CARDIOVERSION N/A 04/26/2018   Procedure: TRANSESOPHAGEAL ECHOCARDIOGRAM (TEE);  Surgeon: Nelva Bush, MD;  Location: [facility] ORS;  Service: Cardiovascular;  Laterality: N/A;  . TONSILLECTOMY    . TOTAL KNEE  ARTHROPLASTY Right 06/20/2017   Procedure: RIGHT TOTAL KNEE ARTHROPLASTY;  Surgeon: Gaynelle Arabian, MD;  Location: [facility] ORS;  Service: Orthopedics;  Laterality: Right;    Family History  Problem Relation Age of Onset  . Heart attack Brother     Social History Social History   Tobacco Use  . Smoking status: Former Smoker    Packs/day: 2.00    Years: 20.00    Pack years: 40.00    Types: Cigarettes    Quit date: 12/26/1989    Years since quitting: 30.4  . Smokeless tobacco: Never Used  Vaping Use  . Vaping Use: Never used  Substance Use Topics  . Alcohol use: No  . Drug use: No    Allergies  Allergen Reactions  . Carisoprodol Itching  . Tamsulosin     Pt stated, "upsets my stomach"    Current Outpatient Medications  Medication Sig Dispense Refill  . amiodarone (PACERONE) 200 MG tablet TAKE 1 TABLET BY MOUTH EVERY DAY 90 tablet 0  . aspirin 81 MG tablet Take 81 mg by mouth daily.    . cholecalciferol (VITAMIN D3) 25 MCG (1000 UT) tablet Take 1,000 Units by mouth daily.    . cyclobenzaprine (FLEXERIL) 10 MG tablet TAKE 1 TABLET BY MOUTH 3 TIMES A DAY 90 tablet 12  . docusate sodium (COLACE) 250 MG capsule Take by mouth.    Mariane Baumgarten Sodium (STOOL SOFTENER LAXATIVE PO) Take by mouth.    . dutasteride (AVODART) 0.5 MG capsule TAKE 1 CAPSULE DAILY 90 capsule 3  . esomeprazole (NEXIUM) 40 MG capsule Take 1 capsule (40 mg total) by mouth 2 (two) times daily. 90 capsule 3  . fexofenadine (ALLEGRA) 180 MG tablet Take 180 mg by mouth daily.      . furosemide (LASIX) 40 MG tablet TAKE 1 TABLET (40 MG) BY MOUTH ONCE DAILY, YOU MAY TAKE 1 EXTRA TABLET (40 MG) AFTER LUNCH AS NEEDED FOR LEG SWELLING/ ABDOMINAL TIGHTNESS 180 tablet 0  . gabapentin (NEURONTIN) 100 MG capsule TAKE 1 CAPSULE BY MOUTH TWICE A DAY 180 capsule 2  . hydrochlorothiazide (HYDRODIURIL) 12.5 MG tablet TAKE 2 TABLETS (25 MG TOTAL) BY MOUTH AT BEDTIME. 180 tablet 1  . insulin aspart (NOVOLOG FLEXPEN) 100 UNIT/ML  FlexPen Inject 12 Units into the skin 3 (three) times daily with meals. (Patient taking differently: Inject 14 Units into the skin 3 (three) times daily with meals. ) 45 mL 3  . Insulin Pen Needle (PEN NEEDLES) 31G X 5 MM MISC 1 each by Does not apply route daily. PATIENT NEEDS NOVA TWIST NEEDLES 5 MM  DX E11.9 200 each 3  . JANUVIA 100 MG tablet TAKE 1 TABLET DAILY 90 tablet 3  . LANTUS SOLOSTAR 100 UNIT/ML Solostar Pen INJECT 35 UNITS INTO THE   SKIN TWO TIMES A DAY 45 mL 3  . liraglutide (  VICTOZA) 18 MG/3ML SOPN INJECT 0.3ML (=1.8MG )      SUBCUTANEOUSLY DAILY 27 mL 3  . magnesium oxide (MAG-OX) 400 MG tablet TAKE 1 TABLET (400 MG TOTAL) BY MOUTH 2 (TWO) TIMES DAILY. 180 tablet 3  . metoprolol tartrate (LOPRESSOR) 100 MG tablet TAKE 1 TABLET (100 MG TOTAL) BY MOUTH 2 (TWO) TIMES DAILY. 60 tablet 11  . Multiple Vitamins-Minerals (CENTRUM SILVER 50+MEN) TABS Take by mouth daily.    [name] NOVOTWIST 32G X 5 MM MISC USE 6 TIMES A DAY DX E11.9 100 each 9  . Omega-3 Fatty Acids (FISH OIL) 1200 MG CAPS Take 1,200 mg by mouth 2 (two) times daily.     Glory Rosebush ULTRA test strip USE WITH METER TWICE DAILY    . oxyCODONE (OXYCONTIN) 40 mg 12 hr tablet Take 1 tablet (40 mg total) by mouth every 8 (eight) hours as needed. 270 tablet 0  . pregabalin (LYRICA) 200 MG capsule TAKE 1 CAPSULE 3 TIMES A   DAY 270 capsule 1  . rOPINIRole (REQUIP) 1 MG tablet Take 1 mg by mouth 2 (two) times daily.    [name] rOPINIRole (REQUIP) 2 MG tablet Take 1 tablet twice a day as needed. 60 tablet 5  . sucralfate (CARAFATE) 1 g tablet TAKE 1 TABLET BY MOUTH 4 (FOUR) TIMES DAILY - WITH MEALS AND AT BEDTIME. 360 tablet 1  . testosterone cypionate (DEPOTESTOSTERONE CYPIONATE) 200 MG/ML injection Inject 200 mg into the muscle every 21 ( twenty-one) days.     . Vitamin D, Ergocalciferol, (DRISDOL) 1.25 MG (50000 UT) CAPS capsule Take by mouth.    [name] aluminum chloride (DRYSOL) 20 % external solution Apply topically at bedtime. (Patient not  taking: Reported on 06/05/2020) 60 mL 5  . amoxicillin-clavulanate (AUGMENTIN) 875-125 MG tablet Take 1 tablet by mouth 2 (two) times daily. (Patient not taking: Reported on 06/05/2020) 14 tablet 0   No current facility-administered medications for this visit.    Review of Systems Review of Systems  Constitutional: Positive for fatigue.  Respiratory: Positive for shortness of breath.   Cardiovascular: Positive for leg swelling.  Gastrointestinal: Positive for constipation.  Endocrine: Positive for polydipsia.  Musculoskeletal: Positive for arthralgias, back pain and neck stiffness.  All other systems reviewed and are negative. Or as discussed in history of present illness.  Blood pressure (!) 153/74, pulse 97, temperature 97.9 F (36.6 C), temperature source Temporal, height 5\' 9"  (1.753 m), weight 264 lb (119.7 kg), SpO2 93 %. Body mass index is 38.99 kg/m.  Physical Exam Physical Exam Vitals reviewed.  Constitutional:      General: He is not in acute distress.    Appearance: He is obese.  HENT:     Head: Normocephalic and atraumatic.     Nose:     Comments: Covered with a mask secondary to COVID-19 precautions    Mouth/Throat:     Comments: Covered with a mask secondary to COVID-19 precautions Eyes:     General: No scleral icterus.       Right eye: No discharge.        Left eye: No discharge.  Neck:     Comments: Vertical scar along the anterior border of the sternocleidomastoid consistent with prior anterior cervical discectomy and fusion.  No thyromegaly or palpable thyroid masses appreciated.  The trachea is midline.  The gland moves freely with deglutition.  No palpable cervical or supraclavicular lymphadenopathy. Cardiovascular:     Rate and Rhythm: Normal rate.     Comments:  Heart sounds very difficult to hear. Pulmonary:     Effort: Pulmonary effort is normal. No respiratory distress.     Comments: Distant breath sounds, secondary to patient body  habitus. Abdominal:     Palpations: Abdomen is soft.     Comments: Protuberant, consistent with his level of obesity.  Genitourinary:    Comments: Deferred Musculoskeletal:     Right lower leg: Edema present.     Left lower leg: Edema present.     Comments: 1-2+ pretibial edema.  Skin changes consistent with venous stasis.  Left lower extremity is currently in a walking boot.  Skin:    General: Skin is warm and dry.  Neurological:     General: No focal deficit present.     Mental Status: He is alert.  Psychiatric:        Mood and Affect: Mood normal.        Behavior: Behavior normal.     Data Reviewed Results for Jerry, Parsons (MRN [id number]) as of 06/05/2020 11:31  Ref. Range 05/29/2020 09:56  Calcium Latest Ref Range: 8.6 - 10.2 mg/dL 11.4 (H)  Calcium Ionized Latest Ref Range: 4.5 - 5.6 mg/dL 6.0 (H)  Total Protein Latest Ref Range: 6.0 - 8.5 g/dL 6.5  Vitamin D, 25-Hydroxy Latest Ref Range: 30.0 - 100.0 ng/mL 40.4  Interpretation Unknown Comment  Albumin ELP Latest Ref Range: 2.9 - 4.4 g/dL 3.4  Alpha 1 Latest Ref Range: 0.0 - 0.4 g/dL 0.2  Alpha 2 Latest Ref Range: 0.4 - 1.0 g/dL 0.8  Beta Latest Ref Range: 0.7 - 1.3 g/dL 1.1  Globulin, Total Latest Ref Range: 2.2 - 3.9 g/dL 3.1  A/G Ratio Latest Ref Range: 0.7 - 1.7  1.1  Gamma Globulin Latest Ref Range: 0.4 - 1.8 g/dL 1.0  M-SPIKE, % Latest Ref Range: Not Observed g/dL Not Observed  Please Note: Unknown Comment  As discussed in the history of present illness, vitamin D is within normal range.  Ionized and serum calcium are both elevated with an inappropriately normal intact PTH.  No abnormalities in the serum protein electrophoresis. Further review of the patient's chemistry values show that his calcium had been normal until November 2019.  Since then, it has remained elevated.  I performed a bedside ultrasound in clinic today.  There were no concerning lesions within the thyroid parenchyma.  He does have a small cyst  in the left lobe.  The exam was technically limited due to patient's inability to fully extend his neck, as well as his body habitus.  There was a suggestion of a possible left-sided parathyroid adenoma.  Assessment 69 year old man who has had hypercalcemia for a number.  He does have an inappropriately normal intact PTH, however he is taking hydrochlorothiazide.  He has also not had urine studies performed to rule out benign familial hypocalciuric hypercalcemia.  Plan I contacted Dr. Alben Spittle office and confirmed that be okay for Mr. Pfahler to stop taking his hydrochlorothiazide for 2 weeks.  Once he has been off the medication for 2 weeks, we will have him perform a 24-hour urine collection for calcium and creatinine.  At the same time, we will recheck a basic metabolic panel in order to calculate a fractional excretion of calcium and rule out [facility].  As his calcium has been greater than 1 mg/dL above the upper limit of normal, he does meet criteria for surgery.  However, in light of his multiple other medical comorbidities, certainly both he and his daughter, who  accompanies him today, would prefer to avoid surgery if possible.  It may be that simply discontinuing hydrochlorothiazide may be adequate to achieve more acceptable calcium levels.  We will await the results from these labs and make further decisions based upon those results.  If we do elect to proceed with surgery, I would prefer to get another localizing study, such as a nuclear medicine parathyroid scan in order to better facilitate a focused operation, guided by Intra-Op PTH.    Fredirick Maudlin 06/05/2020, 11:27 AM

## 2020-06-06 ENCOUNTER — Observation Stay (HOSPITAL_BASED_OUTPATIENT_CLINIC_OR_DEPARTMENT_OTHER)
Admit: 2020-06-06 | Discharge: 2020-06-06 | Disposition: A | Discharge status: Home / Self Care | Payer: Medicare Other | Attending: Internal Medicine | Admitting: Internal Medicine

## 2020-06-06 DIAGNOSIS — R55 Syncope and collapse: Secondary | ICD-10-CM

## 2020-06-06 DIAGNOSIS — I483 Typical atrial flutter: Secondary | ICD-10-CM | POA: Diagnosis not present

## 2020-06-06 DIAGNOSIS — R4020 Unspecified coma: Secondary | ICD-10-CM | POA: Diagnosis not present

## 2020-06-06 DIAGNOSIS — I499 Cardiac arrhythmia, unspecified: Secondary | ICD-10-CM | POA: Diagnosis not present

## 2020-06-06 LAB — CBC
HCT: 39.6 % (ref 39.0–52.0)
Hemoglobin: 12.6 g/dL — ABNORMAL LOW (ref 13.0–17.0)
MCH: 25.3 pg — ABNORMAL LOW (ref 26.0–34.0)
MCHC: 31.8 g/dL (ref 30.0–36.0)
MCV: 79.4 fL — ABNORMAL LOW (ref 80.0–100.0)
Platelets: 97 10*3/uL — ABNORMAL LOW (ref 150–400)
RBC: 4.99 MIL/uL (ref 4.22–5.81)
RDW: 17.2 % — ABNORMAL HIGH (ref 11.5–15.5)
WBC: 4.9 10*3/uL (ref 4.0–10.5)
nRBC: 0 % (ref 0.0–0.2)

## 2020-06-06 LAB — BASIC METABOLIC PANEL
Anion gap: 4 — ABNORMAL LOW (ref 5–15)
BUN: 21 mg/dL (ref 8–23)
CO2: 33 mmol/L — ABNORMAL HIGH (ref 22–32)
Calcium: 10.2 mg/dL (ref 8.9–10.3)
Chloride: 104 mmol/L (ref 98–111)
Creatinine, Ser: 1.17 mg/dL (ref 0.61–1.24)
GFR calc Af Amer: >60 mL/min (ref >60)
GFR calc non Af Amer: >60 mL/min (ref >60)
Glucose, Bld: 172 mg/dL — ABNORMAL HIGH (ref 70–99)
Potassium: 3.7 mmol/L (ref 3.5–5.1)
Sodium: 141 mmol/L (ref 135–145)

## 2020-06-06 LAB — ECHOCARDIOGRAM COMPLETE
Height: 70 in
Weight: 4208 oz

## 2020-06-06 LAB — GLUCOSE, CAPILLARY
Glucose-Capillary: 158 mg/dL — ABNORMAL HIGH (ref 70–99)
Glucose-Capillary: 164 mg/dL — ABNORMAL HIGH (ref 70–99)
Glucose-Capillary: 197 mg/dL — ABNORMAL HIGH (ref 70–99)

## 2020-06-06 LAB — HIV ANTIBODY (ROUTINE TESTING W REFLEX): HIV Screen 4th Generation wRfx: NONREACTIVE

## 2020-06-06 MED ORDER — APIXABAN 5 MG PO TABS
5.0000 mg | ORAL_TABLET | Freq: Two times a day (BID) | ORAL | 0 refills | Status: DC
Start: 2020-06-06 — End: 2020-06-18

## 2020-06-06 MED ORDER — NOVOLOG FLEXPEN 100 UNIT/ML ~~LOC~~ SOPN: 14.0000 [IU] | PEN_INJECTOR | Freq: Three times a day (TID) | SUBCUTANEOUS | Status: DC

## 2020-06-06 MED ORDER — FUROSEMIDE 40 MG PO TABS: 40.0000 mg | ORAL_TABLET | Freq: Every day | ORAL | Status: DC

## 2020-06-06 MED ORDER — LANTUS SOLOSTAR 100 UNIT/ML ~~LOC~~ SOPN: 38.0000 [IU] | PEN_INJECTOR | Freq: Two times a day (BID) | SUBCUTANEOUS | Status: DC

## 2020-06-06 NOTE — Progress Notes (Signed)
Established patient visit   Patient: Jerry Parsons   DOB: [birth year]   69 y.o. Male  MRN: [id number] Visit Date: 06/11/2020  Today's healthcare provider: [name], MD   Chief Complaint  Patient presents with  . Hospitalization Follow-up   Subjective    HPI Follow up ER visit Patient had not slept the night before and had seen the surgeon about possible parathyroid adenoma and a driven home.  He fell asleep in his truck and was found by a neighbor.  He was partially unresponsive until he was shaking vigorously.  He was admitted overnight and found to be in atrial fib flutter.  Started on anticoagulant. Patient was seen in ER for syncope, uncontrolled diabetes mellitis on 06/05/20. Treatment for this included see ER notes. He reports excellent compliance with treatment. He reports this condition is Unchanged.  -----------------------------------------------------------------------------------------    Patient Active Problem List   Diagnosis Date Noted  . Loss of consciousness ([place]) 06/05/2020  . Osteoarthritis of both knees 11/11/2018  . OSA on CPAP 10/14/2018  . MRSA bacteremia 05/19/2018  . Lumbar spondylosis 05/05/2018  . Spinal stenosis of lumbar region 05/05/2018  . Constipation, chronic 03/08/2018  . Pain in limb 03/03/2018  . Gastroesophageal reflux disease without esophagitis 01/04/2018  . OA (osteoarthritis) of knee 06/20/2017  . Sepsis due to pneumonia ([place]) 05/07/2017  . Neuropathy 10/13/2016  . Amblyopia 12/30/2015  . 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 NS (nuclear sclerosis) 12/30/2015  . Pseudoaphakia 12/30/2015  . Dehydration 09/08/2015  . Aspiration pneumonia ([place]) 07/04/2015  . Paroxysmal atrial fibrillation ([place]) 07/04/2015  . Arthritis of knee, degenerative 05/28/2015  . Chronic back pain 09/17/2014  . Leg edema 05/07/2014  . Chronic diastolic CHF (congestive heart failure) ([place]) 05/07/2014  . Campylobacter diarrhea 04/25/2013  . Atrial  flutter ([place]) 01/23/2013  . Obesity 05/19/2012  . Hyperlipidemia 11/11/2011  . Diastolic dysfunction 37/62/8315  . SOB (shortness of breath) 04/12/2011  . Diabetes mellitus type 2, uncontrolled, with complications ([place]) 17/61/6073  . HYPERTENSION, BENIGN 03/15/2011  . DVT 03/15/2011  . TACHYCARDIA 03/15/2011   Past Surgical History:  Procedure Laterality Date  . APPENDECTOMY    . BACK SURGERY     x 8; upper x 3 & lower x 5  . CARDIOVERSION  03/14/13, 10/16   2014 - [employer], 2016 - [place]  . CATARACT EXTRACTION W/PHACO Left 10/29/2015   Procedure: CATARACT EXTRACTION PHACO AND INTRAOCULAR LENS PLACEMENT (IOC);  Surgeon: Leandrew Koyanagi, MD;  Location: [facility];  Service: Ophthalmology;  Laterality: Left;  DIABETIC - insulin and oral medsSleep apnea - no machine  . CHOLECYSTECTOMY    . COLONOSCOPY WITH PROPOFOL N/A 01/16/2018   Procedure: COLONOSCOPY WITH PROPOFOL;  Surgeon: Manya Silvas, MD;  [facility];  Service: Endoscopy;  Laterality: N/A;  . ESOPHAGOGASTRODUODENOSCOPY (EGD) WITH PROPOFOL N/A 01/16/2018   Procedure: ESOPHAGOGASTRODUODENOSCOPY (EGD) WITH PROPOFOL;  Surgeon: Manya Silvas, MD;  Location: [facility] ENDOSCOPY;  Service: Endoscopy;  Laterality: N/A;  . EYE SURGERY    . GALLBLADDER SURGERY  2002  . JOINT REPLACEMENT Right 2018  . KNEE ARTHROSCOPY     left   . ROTATOR CUFF REPAIR  2001   left   . TEE WITHOUT CARDIOVERSION N/A 04/26/2018   Procedure: TRANSESOPHAGEAL ECHOCARDIOGRAM (TEE);  Surgeon: Nelva Bush, MD;  Location: [facility] ORS;  Service: Cardiovascular;  Laterality: N/A;  . TONSILLECTOMY    . TOTAL KNEE ARTHROPLASTY Right 06/20/2017   Procedure: RIGHT TOTAL KNEE ARTHROPLASTY;  Surgeon: Gaynelle Arabian, MD;  Location: [facility] ORS;  Service: Orthopedics;  Laterality: Right;   Social History   Tobacco Use  . Smoking status: Former Smoker    Packs/day: 2.00    Years: 20.00    Pack years: 40.00    Types: Cigarettes    Quit date: 12/26/1989     Years since quitting: 30.4  . Smokeless tobacco: Never Used  Vaping Use  . Vaping Use: Never used  Substance Use Topics  . Alcohol use: No  . Drug use: No   Allergies  Allergen Reactions  . Carisoprodol Itching  . Tamsulosin     Pt stated, "upsets my stomach"       Medications: Outpatient Medications Prior to Visit  Medication Sig  . amiodarone (PACERONE) 200 MG tablet TAKE 1 TABLET BY MOUTH EVERY DAY (Patient taking differently: Take 200 mg by mouth daily. )  . apixaban (ELIQUIS) 5 MG TABS tablet Take 1 tablet (5 mg total) by mouth 2 (two) times daily.  . cholecalciferol (VITAMIN D3) 25 MCG (1000 UT) tablet Take 1,000 Units by mouth daily.  . cyclobenzaprine (FLEXERIL) 10 MG tablet TAKE 1 TABLET BY MOUTH 3 TIMES A DAY (Patient taking differently: Take 10 mg by mouth 3 (three) times daily. )  . docusate sodium (COLACE) 250 MG capsule Take 250 mg by mouth daily.   [name] dutasteride (AVODART) 0.5 MG capsule TAKE 1 CAPSULE DAILY (Patient taking differently: Take 0.5 mg by mouth daily. )  . esomeprazole (NEXIUM) 40 MG capsule Take 1 capsule (40 mg total) by mouth 2 (two) times daily.  . fexofenadine (ALLEGRA) 180 MG tablet Take 180 mg by mouth daily.    . furosemide (LASIX) 40 MG tablet Take 1-2 tablets (40-80 mg total) by mouth daily. TAKE 1 TABLET (40 MG) BY MOUTH ONCE DAILY, YOU MAY TAKE 1 EXTRA TABLET (40 MG) AFTER LUNCH AS NEEDED FOR LEG SWELLING/ ABDOMINAL TIGHTNESS  . gabapentin (NEURONTIN) 100 MG capsule TAKE 1 CAPSULE BY MOUTH TWICE A DAY (Patient taking differently: Take 100 mg by mouth 2 (two) times daily. )  . hydrochlorothiazide (HYDRODIURIL) 12.5 MG tablet TAKE 2 TABLETS (25 MG TOTAL) BY MOUTH AT BEDTIME.  . insulin aspart (NOVOLOG FLEXPEN) 100 UNIT/ML FlexPen Inject 14 Units into the skin 3 (three) times daily with meals.  . Insulin Pen Needle (PEN NEEDLES) 31G X 5 MM MISC 1 each by Does not apply route daily. PATIENT NEEDS NOVA TWIST NEEDLES 5 MM  DX E11.9  . JANUVIA 100  MG tablet TAKE 1 TABLET DAILY (Patient taking differently: Take 100 mg by mouth daily. )  . LANTUS SOLOSTAR 100 UNIT/ML Solostar Pen Inject 38 Units into the skin 2 (two) times daily.  [name] liraglutide (VICTOZA) 18 MG/3ML SOPN INJECT 0.3ML (=1.8MG )      SUBCUTANEOUSLY DAILY  . magnesium oxide (MAG-OX) 400 MG tablet TAKE 1 TABLET (400 MG TOTAL) BY MOUTH 2 (TWO) TIMES DAILY. (Patient taking differently: Take 400 mg by mouth 2 (two) times daily. )  . metoprolol tartrate (LOPRESSOR) 100 MG tablet TAKE 1 TABLET (100 MG TOTAL) BY MOUTH 2 (TWO) TIMES DAILY.  [name] NOVOTWIST 32G X 5 MM MISC USE 6 TIMES A DAY DX E11.9  . Omega-3 Fatty Acids (FISH OIL) 1200 MG CAPS Take 1,200 mg by mouth 2 (two) times daily.   Glory Rosebush ULTRA test strip USE WITH METER TWICE DAILY  . oxyCODONE (OXYCONTIN) 40 mg 12 hr tablet Take 1 tablet (40 mg total) by mouth every 8 (  eight) hours as needed.  . pregabalin (LYRICA) 200 MG capsule TAKE 1 CAPSULE 3 TIMES A   DAY (Patient taking differently: Take 200 mg by mouth 3 (three) times daily. )  . rOPINIRole (REQUIP) 1 MG tablet Take 1 mg by mouth 2 (two) times daily.  [name] rOPINIRole (REQUIP) 2 MG tablet Take 1 tablet twice a day as needed. (Patient taking differently: Take 2 mg by mouth 2 (two) times daily. Take 1 tablet twice a day as needed.)  . sucralfate (CARAFATE) 1 g tablet TAKE 1 TABLET BY MOUTH 4 (FOUR) TIMES DAILY - WITH MEALS AND AT BEDTIME. (Patient taking differently: Take 1 g by mouth 4 (four) times daily. )  . testosterone cypionate (DEPOTESTOSTERONE CYPIONATE) 200 MG/ML injection Inject 200 mg into the muscle every 21 ( twenty-one) days.   . Multiple Vitamins-Minerals (CENTRUM SILVER 50+MEN) TABS Take 1 tablet by mouth daily.    No facility-administered medications prior to visit.    Review of Systems  Constitutional: Negative.  Negative for appetite change, chills and fever.  HENT: Negative.   Eyes: Negative.   Respiratory: Negative.  Negative for chest tightness,  shortness of breath and wheezing.   Cardiovascular: Positive for leg swelling. Negative for chest pain and palpitations.  Gastrointestinal: Negative.  Negative for abdominal pain, nausea and vomiting.  Endocrine: Negative.   Musculoskeletal: Positive for arthralgias and back pain.  Allergic/Immunologic: Negative.   Neurological: Negative for dizziness, light-headedness and headaches.  Psychiatric/Behavioral: Negative.       Objective    BP 128/62 (BP Location: Right Arm, Patient Position: Sitting, Cuff Size: Large)   Pulse 68   Temp (!) 97.3 F (36.3 C) (Temporal)   Wt 271 lb (122.9 kg)   BMI 38.88 kg/m  BP Readings from Last 3 Encounters:  06/11/20 128/62  06/06/20 (!) 133/50  06/05/20 (!) 153/74      Physical Exam Vitals and nursing note reviewed.  Constitutional:      Appearance: Normal appearance. He is normal weight.  Eyes:     Conjunctiva/sclera: Conjunctivae normal.  Cardiovascular:     Rate and Rhythm: Normal rate and regular rhythm.     Pulses: Normal pulses.     Heart sounds: Normal heart sounds.  Pulmonary:     Effort: Pulmonary effort is normal.     Breath sounds: Normal breath sounds.  Abdominal:     General: Bowel sounds are normal.  Musculoskeletal:     Cervical back: Normal range of motion and neck supple.     Comments: Walking boot on his foot.  Skin:    General: Skin is warm and dry.  Neurological:     General: No focal deficit present.     Mental Status: He is alert and oriented to person, place, and time.  Psychiatric:        Mood and Affect: Mood normal.        Behavior: Behavior normal.        Thought Content: Thought content normal.        Judgment: Judgment normal.       No results found for any visits on 06/11/20.  Assessment & Plan     1. Typical atrial flutter ([place]) Patient now on Eliquis 5 mg twice a day.  2. Chronic bilateral low back pain with bilateral sciatica   3. Opioid use With recent syncopal episode thought to  be secondary to atrial fibrillation have given him a prescription for naloxone to have available.  It could be  that the narcotic on board in addition to the sleeplessness led to the opioid contributing to the somnolent/presyncope  4. HYPERTENSION, BENIGN   5. OSA on CPAP Uses CPAP nightly Patient thinks he might need a new CPAP machine.  Will attempt to get old study to see if this will allow him to get another machine 6. Class 2 severe obesity due to excess calories with serious comorbidity and body mass index (BMI) of 38.0 to 38.9 in adult [facility]) Patient working hard on weight loss.  7. Mixed hyperlipidemia    No follow-ups on file.      I, [name], MD, have reviewed all documentation for this visit. The documentation on 06/16/20 for the exam, diagnosis, procedures, and orders are all accurate and complete.    Jessee Mezera Cranford Mon, MD  [facility] [phone] (phone) [phone] (fax)  [place]

## 2020-06-06 NOTE — Discharge Summary (Addendum)
Physician Discharge Summary  Jerry Parsons HER:[id number] DOB: September 26, 1951 DOA: 06/05/2020  PCP: Jerrol Banana., MD  Admit date: 06/05/2020 Discharge date: 06/06/2020  Discharge disposition: Home   Recommendations for Outpatient Follow-Up:   Outpatient follow-up with PCP   Discharge Diagnosis:   Active Problems:   Atrial flutter ([place])   Loss of consciousness ([place])    Discharge Condition: Stable.  Diet recommendation: Heart healthy and diabetic diet  Code status: Full code.    Hospital Course:   Jerry Parsons is an 69 y.o. male with medical history significant for remote history of childhood seizures (has not had a seizure since age 8) paroxysmal atrial fibrillation x2 (Second episode was in 2014 after back surgery), atrial flutter and has been off of anticoagulation for "many years", BPH, diabetes mellitus, history of DVT and PE, neuromuscular disorder, pneumonia, hypertension, CKD stage IIIa, multiple back surgeries.  He was brought to the hospital after he was found in a low pressure state in his car.  According to his wife, he did not have a good night sleep the night before admission and she thought that patient probably slept in his car because he was very exhausted.  He was admitted to the hospital for observation.  He did not show any acute abnormality.  2D echo showed EF estimated at 60 to 65%, mild LVH and LV diastolic parameters are indeterminate.  Left atrial size was moderately dilated.  EKG and telemetry showed rate controlled atrial flutter. CHA2DS2VASC score is minimal of 5.  He said he was taken off of anticoagulants many years ago.  He is not sure why but he did not have any bleeding complication.  Risks and benefits of long-term anticoagulation was discussed with the patient and his wife.  They have opted for long-term anticoagulation with Eliquis given his increased risk of stroke.  They understand that he can have bleeding complication from Eliquis.   He is asymptomatic and he is deemed stable for discharge to home today.  Discharge plan was discussed with the patient and his wife.      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     Procedures and Diagnostic Studies:   CT Head Wo Contrast  Result Date: 06/05/2020 CLINICAL DATA:  Syncope. EXAM: CT HEAD WITHOUT CONTRAST TECHNIQUE: Contiguous axial images were obtained from the base of the skull through the vertex without intravenous contrast. COMPARISON:  September 08, 2015 FINDINGS: Brain: There is mild cerebral atrophy with widening of the extra-axial spaces and ventricular dilatation.  There are areas of decreased attenuation within the white matter tracts of the supratentorial brain, consistent with microvascular disease changes. A stable area of curvilinear chronic cortical calcification is seen within the posterior parietal region on the left. Vascular: No hyperdense vessels are identified. Skull: Normal. Negative for fracture or focal lesion. Sinuses/Orbits: No acute finding. Other: None. IMPRESSION: 1. Generalized cerebral atrophy. 2. No acute intracranial abnormality. Electronically Signed   By: Jerry Parsons M.D.   On: 06/05/2020 16:13    MR BRAIN WO CONTRAST  Result Date: 06/05/2020 CLINICAL DATA:  Syncope EXAM: MRI HEAD WITHOUT CONTRAST TECHNIQUE: Multiplanar, multiecho pulse sequences of the brain and surrounding structures were obtained without intravenous contrast. COMPARISON:  09/09/2015 FINDINGS: BRAIN: No acute infarct, acute hemorrhage or extra-axial collection. Normal white matter signal. Normal volume of CSF spaces. Hemosiderin deposition over the left parietal lobe posteriorly, unchanged. Speech superior, posterior left hemisphere rack noise cyst. Normal midline structures. VASCULAR: Major flow voids are preserved. SKULL AND UPPER CERVICAL SPINE: Normal calvarium and skull base. Visualized upper cervical spine and soft tissues are normal. SINUSES/ORBITS: Small amount of right mastoid fluid. No paranasal sinus fluid levels or advanced mucosal thickening. Normal orbits. IMPRESSION: 1. No acute intracranial abnormality. 2. Unchanged left parietal hemosiderin deposition. Electronically Signed   By: Jerry Parsons M.D.   On: 06/05/2020 23:33   DG Chest Portable 1 View  Result Date: 06/05/2020 CLINICAL DATA:  Diaphoresis and syncope today. EXAM: PORTABLE CHEST 1 VIEW COMPARISON:  PA and lateral chest 06/06/2018. FINDINGS: Lungs clear. Heart size upper normal. No pneumothorax or pleural fluid. No acute or focal bony abnormality. IMPRESSION: No acute disease. Electronically Signed   By: Jerry Parsons M.D.   On: 06/05/2020 15:21     Labs:   Basic Metabolic Panel: Recent Labs  Lab 06/05/20 1444 06/06/20 0430  NA 139 141  K 3.4* 3.7  CL 102 104  CO2 30 33*  GLUCOSE 179* 172*  BUN 25* 21  CREATININE 1.39* 1.17  CALCIUM 10.4* 10.2   GFR Estimated Creatinine Clearance: 76 mL/min (by C-G formula based on SCr of 1.17 mg/dL). Liver Function Tests: Recent Labs  Lab 06/05/20 1444  AST 26  ALT 28  ALKPHOS 93  BILITOT 0.7  PROT 6.6  ALBUMIN 3.5   No results for input(s): LIPASE, AMYLASE in the last 168 hours. No  results for input(s): AMMONIA in the last 168 hours. Coagulation profile No results for input(s): INR, PROTIME in the last 168 hours.  CBC: Recent Labs  Lab 06/05/20 1444 06/06/20 0430  WBC 6.2 4.9  NEUTROABS 3.5  --   HGB 12.8* 12.6*  HCT 41.3 39.6  MCV 80.4 79.4*  PLT 115* 97*   Cardiac Enzymes: Recent Labs  Lab 06/05/20 1444  CKTOTAL 153   BNP: Invalid input(s): POCBNP CBG: Recent Labs  Lab 06/05/20 1451 06/05/20 2215 06/06/20 0807 06/06/20 1143 06/06/20 1533  GLUCAP 197* 282* 158* 164* 197*   D-Dimer No results for input(s): DDIMER in the last 72 hours. Hgb A1c No results for input(s): HGBA1C in the last 72 hours. Lipid Profile No results for input(s): CHOL, HDL, LDLCALC, TRIG, CHOLHDL, LDLDIRECT in the last 72 hours. Thyroid function studies Recent Labs    06/05/20 1444  TSH 0.034*   Anemia work up No results for input(s): VITAMINB12, FOLATE, FERRITIN, TIBC, IRON, RETICCTPCT in the last 72 hours. Microbiology Recent Results (from the past 240 hour(s))  SARS Coronavirus 2 by RT PCR (hospital order, performed in [facility] hospital  lab) Nasopharyngeal Nasopharyngeal Swab     Status: None   Collection Time: 06/05/20  8:54 PM   Specimen: Nasopharyngeal Swab  Result Value Ref Range Status   SARS Coronavirus 2 NEGATIVE NEGATIVE Final    Comment: (NOTE) SARS-CoV-2 target nucleic acids are NOT DETECTED.  The SARS-CoV-2 RNA is generally detectable in upper and lower respiratory specimens during the acute phase of infection. The lowest concentration of SARS-CoV-2 viral copies this assay can detect is 250 copies / mL. A negative result does not preclude SARS-CoV-2 infection and should not be used as the sole basis for treatment or other patient management decisions.  A negative result may occur with improper specimen collection / handling, submission of specimen other than nasopharyngeal swab, presence of viral mutation(s) within the areas targeted by this  assay, and inadequate number of viral copies (<250 copies / mL). A negative result must be combined with clinical observations, patient history, and epidemiological information.  Fact Sheet for Patients:   StrictlyIdeas.no  Fact Sheet for Healthcare Providers: BankingDealers.co.za  This test is not yet approved or  cleared by the [place] FDA and has been authorized for detection and/or diagnosis of SARS-CoV-2 by FDA under an Emergency Use Authorization (EUA).  This EUA will remain in effect (meaning this test can be used) for the duration of the COVID-19 declaration under Section 564(b)(1) of the Act, 21 U.S.C. section 360bbb-3(b)(1), unless the authorization is terminated or revoked sooner.  Performed at [facility], [address] [place], [place] [address]      Discharge Instructions:   Discharge Instructions    Diet - low sodium heart healthy   Complete by: As directed    Diet Carb Modified   Complete by: As directed    Increase activity slowly   Complete by: As directed      Allergies as of 06/06/2020      Reactions   Carisoprodol Itching   Tamsulosin    Pt stated, "upsets my stomach"      Medication List    STOP taking these medications   amoxicillin-clavulanate 875-125 MG tablet Commonly known as: Augmentin   aspirin 81 MG tablet   Drysol 20 % external solution Generic drug: aluminum chloride     TAKE these medications   amiodarone 200 MG tablet Commonly known as: PACERONE TAKE 1 TABLET BY MOUTH EVERY DAY   apixaban 5 MG Tabs tablet Commonly known as: ELIQUIS Take 1 tablet (5 mg total) by mouth 2 (two) times daily.   Centrum Silver 50+Men Tabs Take 1 tablet by mouth daily.   cholecalciferol 25 MCG (1000 UNIT) tablet Commonly known as: VITAMIN D3 Take 1,000 Units by mouth daily.   cyclobenzaprine 10 MG tablet Commonly known as: FLEXERIL TAKE 1 TABLET BY MOUTH 3 TIMES A DAY     docusate sodium 250 MG capsule Commonly known as: COLACE Take 250 mg by mouth daily.   dutasteride 0.5 MG capsule Commonly known as: AVODART TAKE 1 CAPSULE DAILY   esomeprazole 40 MG capsule Commonly known as: NEXIUM Take 1 capsule (40 mg total) by mouth 2 (two) times daily.   fexofenadine 180 MG tablet Commonly known as: ALLEGRA Take 180 mg by mouth daily.   Fish Oil 1200 MG Caps Take 1,200 mg by mouth 2 (two) times daily.   furosemide 40 MG tablet Commonly known as: LASIX Take 1-2 tablets (40-80 mg total) by mouth daily. TAKE 1 TABLET (40 MG) BY MOUTH ONCE DAILY, YOU MAY TAKE 1 EXTRA  TABLET (40 MG) AFTER LUNCH AS NEEDED FOR LEG SWELLING/ ABDOMINAL TIGHTNESS What changed: See the new instructions.   gabapentin 100 MG capsule Commonly known as: NEURONTIN TAKE 1 CAPSULE BY MOUTH TWICE A DAY   hydrochlorothiazide 12.5 MG tablet Commonly known as: HYDRODIURIL TAKE 2 TABLETS (25 MG TOTAL) BY MOUTH AT BEDTIME.   Januvia 100 MG tablet Generic drug: sitaGLIPtin TAKE 1 TABLET DAILY What changed: how much to take   Lantus SoloStar 100 UNIT/ML Solostar Pen Generic drug: insulin glargine Inject 38 Units into the skin 2 (two) times daily. What changed: See the new instructions.   magnesium oxide 400 MG tablet Commonly known as: MAG-OX TAKE 1 TABLET (400 MG TOTAL) BY MOUTH 2 (TWO) TIMES DAILY. What changed: See the new instructions.   metoprolol tartrate 100 MG tablet Commonly known as: LOPRESSOR TAKE 1 TABLET (100 MG TOTAL) BY MOUTH 2 (TWO) TIMES DAILY.   NovoLOG FlexPen 100 UNIT/ML FlexPen Generic drug: insulin aspart Inject 14 Units into the skin 3 (three) times daily with meals.   OneTouch Ultra test strip Generic drug: glucose blood USE WITH METER TWICE DAILY   oxyCODONE 40 mg 12 hr tablet Commonly known as: OxyCONTIN Take 1 tablet (40 mg total) by mouth every 8 (eight) hours as needed.   Pen Needles 31G X 5 MM Misc 1 each by Does not apply route daily.  PATIENT NEEDS NOVA TWIST NEEDLES 5 MM  DX E11.9   NovoTwist 32G X 5 MM Misc Generic drug: Insulin Pen Needle USE 6 TIMES A DAY DX E11.9   pregabalin 200 MG capsule Commonly known as: LYRICA TAKE 1 CAPSULE 3 TIMES A   DAY What changed: See the new instructions.   rOPINIRole 2 MG tablet Commonly known as: REQUIP Take 1 tablet twice a day as needed. What changed:   how much to take  how to take this  when to take this   rOPINIRole 1 MG tablet Commonly known as: REQUIP Take 1 mg by mouth 2 (two) times daily. What changed: Another medication with the same name was changed. Make sure you understand how and when to take each.   sucralfate 1 g tablet Commonly known as: CARAFATE TAKE 1 TABLET BY MOUTH 4 (FOUR) TIMES DAILY - WITH MEALS AND AT BEDTIME. What changed: See the new instructions.   testosterone cypionate 200 MG/ML injection Commonly known as: DEPOTESTOSTERONE CYPIONATE Inject 200 mg into the muscle every 21 ( twenty-one) days.   Victoza 18 MG/3ML Sopn Generic drug: liraglutide INJECT 0.3ML (=1.8MG )      SUBCUTANEOUSLY DAILY       Follow-up Information    Jerrol Banana., MD On 06/11/2020.   Specialty: Family Medicine Why: @ 11:20 am Contact information: [address] [place] [address] [phone]                Time coordinating discharge: 32 minutes  Signed:  Jennye Boroughs  [facility] 06/06/2020, 4:11 PM

## 2020-06-06 NOTE — Plan of Care (Signed)
  Problem: Education: Goal: Knowledge of General Education information will improve Description: Including pain rating scale, medication(s)/side effects and non-pharmacologic comfort measures Outcome: Progressing   Problem: Clinical Measurements: Goal: Ability to maintain clinical measurements within normal limits will improve Outcome: Progressing Goal: Will remain free from infection Outcome: Progressing Goal: Diagnostic test results will improve Outcome: Progressing Goal: Respiratory complications will improve Outcome: Progressing Goal: Cardiovascular complication will be avoided Outcome: Progressing   Problem: Activity: Goal: Risk for activity intolerance will decrease Outcome: Progressing   Problem: Pain Managment: Goal: General experience of comfort will improve Outcome: Progressing   Problem: Safety: Goal: Ability to remain free from injury will improve Outcome: Progressing   

## 2020-06-06 NOTE — Progress Notes (Signed)
*  PRELIMINARY RESULTS* Echocardiogram 2D Echocardiogram has been performed.  Sherrie Sport 06/06/2020, 1:13 PM

## 2020-06-06 NOTE — Progress Notes (Signed)
Patient given discharge instructions. Verbalizes understanding. Patient with no complaints at the current time. Spouse at bedside.

## 2020-06-11 ENCOUNTER — Encounter: Payer: Self-pay | Admitting: Family Medicine

## 2020-06-11 ENCOUNTER — Ambulatory Visit (INDEPENDENT_AMBULATORY_CARE_PROVIDER_SITE_OTHER): Payer: Medicare Other | Admitting: Family Medicine

## 2020-06-11 ENCOUNTER — Other Ambulatory Visit: Payer: Self-pay

## 2020-06-11 VITALS — BP 128/62 | HR 68 | Temp 97.3°F | Wt 271.0 lb

## 2020-06-11 DIAGNOSIS — Z9989 Dependence on other enabling machines and devices: Secondary | ICD-10-CM

## 2020-06-11 DIAGNOSIS — M5441 Lumbago with sciatica, right side: Secondary | ICD-10-CM

## 2020-06-11 DIAGNOSIS — I483 Typical atrial flutter: Secondary | ICD-10-CM | POA: Diagnosis not present

## 2020-06-11 DIAGNOSIS — M5442 Lumbago with sciatica, left side: Secondary | ICD-10-CM

## 2020-06-11 DIAGNOSIS — Z6838 Body mass index (BMI) 38.0-38.9, adult: Secondary | ICD-10-CM

## 2020-06-11 DIAGNOSIS — E782 Mixed hyperlipidemia: Secondary | ICD-10-CM

## 2020-06-11 DIAGNOSIS — G4733 Obstructive sleep apnea (adult) (pediatric): Secondary | ICD-10-CM

## 2020-06-11 DIAGNOSIS — F1190 Opioid use, unspecified, uncomplicated: Secondary | ICD-10-CM | POA: Diagnosis not present

## 2020-06-11 DIAGNOSIS — I10 Essential (primary) hypertension: Secondary | ICD-10-CM | POA: Diagnosis not present

## 2020-06-11 DIAGNOSIS — G8929 Other chronic pain: Secondary | ICD-10-CM

## 2020-06-12 ENCOUNTER — Other Ambulatory Visit: Payer: Self-pay | Admitting: Family Medicine

## 2020-06-12 DIAGNOSIS — E1165 Type 2 diabetes mellitus with hyperglycemia: Secondary | ICD-10-CM

## 2020-06-12 DIAGNOSIS — IMO0002 Reserved for concepts with insufficient information to code with codable children: Secondary | ICD-10-CM

## 2020-06-12 MED ORDER — NALOXONE HCL 0.4 MG/0.4ML IJ SOAJ: INTRAMUSCULAR | 2 refills | Status: DC

## 2020-06-12 NOTE — Telephone Encounter (Signed)
   Notes to clinic Dose inconsistent with current med list

## 2020-06-17 ENCOUNTER — Ambulatory Visit (INDEPENDENT_AMBULATORY_CARE_PROVIDER_SITE_OTHER): Payer: Medicare Other

## 2020-06-17 ENCOUNTER — Ambulatory Visit (INDEPENDENT_AMBULATORY_CARE_PROVIDER_SITE_OTHER): Payer: Medicare Other | Admitting: Sports Medicine

## 2020-06-17 ENCOUNTER — Other Ambulatory Visit: Payer: Self-pay

## 2020-06-17 ENCOUNTER — Encounter: Payer: Self-pay | Admitting: Sports Medicine

## 2020-06-17 VITALS — Temp 96.5°F

## 2020-06-17 DIAGNOSIS — S92422D Displaced fracture of distal phalanx of left great toe, subsequent encounter for fracture with routine healing: Secondary | ICD-10-CM

## 2020-06-17 DIAGNOSIS — E1142 Type 2 diabetes mellitus with diabetic polyneuropathy: Secondary | ICD-10-CM | POA: Diagnosis not present

## 2020-06-17 DIAGNOSIS — I739 Peripheral vascular disease, unspecified: Secondary | ICD-10-CM | POA: Diagnosis not present

## 2020-06-17 DIAGNOSIS — M2040 Other hammer toe(s) (acquired), unspecified foot: Secondary | ICD-10-CM

## 2020-06-17 DIAGNOSIS — S9032XD Contusion of left foot, subsequent encounter: Secondary | ICD-10-CM | POA: Diagnosis not present

## 2020-06-17 NOTE — Progress Notes (Signed)
Subjective: Jerry Parsons is a 69 y.o. male patient with history of diabetes who returns to office for follow-up evaluation of left first toe fracture.  Patient reports pain is the same some days 6 out of 10 while there are days 8 out of 10 depending on what he is doing reports that he has been wearing surgical shoe but does admit to a time of taking the shoe off and crouching and stooping down at home looking for the remote and having increased pain on left greater than right hallux reports that he is also noticed some rubbing from his shoe at the top of his right great toe so he cut a hole out in the shoe to prevent irritation on the right.  Patient reports that his blood sugar is higher than usual and he is still struggling with swelling in both lower extremities has not been able to wear compression stockings due to the pain that it causes to the left toe.  Patient denies any changes with medical history still on Eliquis.  No other pedal complaints noted.  Patient Active Problem List   Diagnosis Date Noted  . Loss of consciousness ([place]) 06/05/2020  . Osteoarthritis of both knees 11/11/2018  . OSA on CPAP 10/14/2018  . MRSA bacteremia 05/19/2018  . Lumbar spondylosis 05/05/2018  . Spinal stenosis of lumbar region 05/05/2018  . Constipation, chronic 03/08/2018  . Pain in limb 03/03/2018  . Gastroesophageal reflux disease without esophagitis 01/04/2018  . OA (osteoarthritis) of knee 06/20/2017  . Sepsis due to pneumonia ([place]) 05/07/2017  . Neuropathy 10/13/2016  . Amblyopia 12/30/2015  . 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 NS (nuclear sclerosis) 12/30/2015  . Pseudoaphakia 12/30/2015  . Dehydration 09/08/2015  . Aspiration pneumonia ([place]) 07/04/2015  . Paroxysmal atrial fibrillation ([place]) 07/04/2015  . Arthritis of knee, degenerative 05/28/2015  . Chronic back pain 09/17/2014  . Leg edema 05/07/2014  . Chronic diastolic CHF (congestive heart failure) ([place]) 05/07/2014  . Campylobacter diarrhea  04/25/2013  . Atrial flutter ([place]) 01/23/2013  . Obesity 05/19/2012  . Hyperlipidemia 11/11/2011  . Diastolic dysfunction 32/01/3342  . SOB (shortness of breath) 04/12/2011  . Diabetes mellitus type 2, uncontrolled, with complications ([place]) 56/86/1683  . HYPERTENSION, BENIGN 03/15/2011  . DVT 03/15/2011  . TACHYCARDIA 03/15/2011   Current Outpatient Medications on File Prior to Visit  Medication Sig Dispense Refill  . amiodarone (PACERONE) 200 MG tablet TAKE 1 TABLET BY MOUTH EVERY DAY (Patient taking differently: Take 200 mg by mouth daily. ) 90 tablet 0  . apixaban (ELIQUIS) 5 MG TABS tablet Take 1 tablet (5 mg total) by mouth 2 (two) times daily. 60 tablet 0  . cholecalciferol (VITAMIN D3) 25 MCG (1000 UT) tablet Take 1,000 Units by mouth daily.    . cyclobenzaprine (FLEXERIL) 10 MG tablet TAKE 1 TABLET BY MOUTH 3 TIMES A DAY (Patient taking differently: Take 10 mg by mouth 3 (three) times daily. ) 90 tablet 12  . docusate sodium (COLACE) 250 MG capsule Take 250 mg by mouth daily.     [name] dutasteride (AVODART) 0.5 MG capsule TAKE 1 CAPSULE DAILY (Patient taking differently: Take 0.5 mg by mouth daily. ) 90 capsule 3  . esomeprazole (NEXIUM) 40 MG capsule Take 1 capsule (40 mg total) by mouth 2 (two) times daily. 90 capsule 3  . fexofenadine (ALLEGRA) 180 MG tablet Take 180 mg by mouth daily.      . furosemide (LASIX) 40 MG tablet Take 1-2 tablets (40-80 mg total) by  mouth daily. TAKE 1 TABLET (40 MG) BY MOUTH ONCE DAILY, YOU MAY TAKE 1 EXTRA TABLET (40 MG) AFTER LUNCH AS NEEDED FOR LEG SWELLING/ ABDOMINAL TIGHTNESS    . gabapentin (NEURONTIN) 100 MG capsule TAKE 1 CAPSULE BY MOUTH TWICE A DAY (Patient taking differently: Take 100 mg by mouth 2 (two) times daily. ) 180 capsule 2  . hydrochlorothiazide (HYDRODIURIL) 12.5 MG tablet TAKE 2 TABLETS (25 MG TOTAL) BY MOUTH AT BEDTIME. 180 tablet 1  . Insulin Pen Needle (PEN NEEDLES) 31G X 5 MM MISC 1 each by Does not apply route daily. PATIENT  NEEDS NOVA TWIST NEEDLES 5 MM  DX E11.9 200 each 3  . JANUVIA 100 MG tablet TAKE 1 TABLET DAILY (Patient taking differently: Take 100 mg by mouth daily. ) 90 tablet 3  . LANTUS SOLOSTAR 100 UNIT/ML Solostar Pen Inject 38 Units into the skin 2 (two) times daily.    [name] liraglutide (VICTOZA) 18 MG/3ML SOPN INJECT 0.3ML (=1.8MG)      SUBCUTANEOUSLY DAILY 27 mL 3  . magnesium oxide (MAG-OX) 400 MG tablet TAKE 1 TABLET (400 MG TOTAL) BY MOUTH 2 (TWO) TIMES DAILY. (Patient taking differently: Take 400 mg by mouth 2 (two) times daily. ) 180 tablet 3  . metoprolol tartrate (LOPRESSOR) 100 MG tablet TAKE 1 TABLET (100 MG TOTAL) BY MOUTH 2 (TWO) TIMES DAILY. 60 tablet 11  . Multiple Vitamins-Minerals (CENTRUM SILVER 50+MEN) TABS Take 1 tablet by mouth daily.     . Naloxone HCl 0.4 MG/0.4ML SOAJ Inject as needed for suspected overdose. 0.4 mL 2  . NOVOLOG FLEXPEN 100 UNIT/ML FlexPen INJECT 12 UNITS            SUBCUTANEOUSLY 3 TIMES A   DAY WITH MEALS 15 mL 5  . NOVOTWIST 32G X 5 MM MISC USE 6 TIMES A DAY DX E11.9 100 each 9  . Omega-3 Fatty Acids (FISH OIL) 1200 MG CAPS Take 1,200 mg by mouth 2 (two) times daily.     Glory Rosebush ULTRA test strip USE WITH METER TWICE DAILY    . oxyCODONE (OXYCONTIN) 40 mg 12 hr tablet Take 1 tablet (40 mg total) by mouth every 8 (eight) hours as needed. 270 tablet 0  . pregabalin (LYRICA) 200 MG capsule TAKE 1 CAPSULE 3 TIMES A   DAY (Patient taking differently: Take 200 mg by mouth 3 (three) times daily. ) 270 capsule 1  . rOPINIRole (REQUIP) 1 MG tablet Take 1 mg by mouth 2 (two) times daily.    [name] rOPINIRole (REQUIP) 2 MG tablet Take 1 tablet twice a day as needed. (Patient taking differently: Take 2 mg by mouth 2 (two) times daily. Take 1 tablet twice a day as needed.) 60 tablet 5  . sucralfate (CARAFATE) 1 g tablet TAKE 1 TABLET BY MOUTH 4 (FOUR) TIMES DAILY - WITH MEALS AND AT BEDTIME. (Patient taking differently: Take 1 g by mouth 4 (four) times daily. ) 360 tablet 1  .  testosterone cypionate (DEPOTESTOSTERONE CYPIONATE) 200 MG/ML injection Inject 200 mg into the muscle every 21 ( twenty-one) days.      No current facility-administered medications on file prior to visit.   Allergies  Allergen Reactions  . Carisoprodol Itching  . Tamsulosin     Pt stated, "upsets my stomach"    Recent Results (from the past 2160 hour(s))  Lipid panel     Status: Abnormal   Collection Time: 05/15/20 10:23 AM  Result Value Ref Range   Cholesterol, Total  134 100 - 199 mg/dL   Triglycerides 233 (H) 0 - 149 mg/dL   HDL 29 (L) >39 mg/dL   VLDL Cholesterol Cal 38 5 - 40 mg/dL   LDL Chol Calc (NIH) 67 0 - 99 mg/dL   Chol/HDL Ratio 4.6 0.0 - 5.0 ratio    Comment:                                   T. Chol/HDL Ratio                                             Men  Women                               1/2 Avg.Risk  3.4    3.3                                   Avg.Risk  5.0    4.4                                2X Avg.Risk  9.6    7.1                                3X Avg.Risk 23.4   11.0   TSH     Status: None   Collection Time: 05/15/20 10:23 AM  Result Value Ref Range   TSH 0.491 0.450 - 4.500 uIU/mL  Comprehensive Metabolic Panel (CMET)     Status: Abnormal   Collection Time: 05/15/20 10:23 AM  Result Value Ref Range   Glucose 135 (H) 65 - 99 mg/dL   BUN 20 8 - 27 mg/dL   Creatinine, Ser 1.33 (H) 0.76 - 1.27 mg/dL   GFR calc non Af Amer 54 (L) >59 mL/min/1.73   GFR calc Af Amer 62 >59 mL/min/1.73    Comment: **[facility] currently reports eGFR in compliance with the current**   recommendations of the [employer]. [facility] will   update reporting as new guidelines are published from the NKF-ASN   Task force.    BUN/Creatinine Ratio 15 10 - 24   Sodium 140 134 - 144 mmol/L   Potassium 4.1 3.5 - 5.2 mmol/L   Chloride 99 96 - 106 mmol/L   CO2 31 (H) 20 - 29 mmol/L   Calcium 11.0 (H) 8.6 - 10.2 mg/dL   Total Protein 6.6 6.0 - 8.5 g/dL   Albumin 4.0 3.8  - 4.8 g/dL   Globulin, Total 2.6 1.5 - 4.5 g/dL   Albumin/Globulin Ratio 1.5 1.2 - 2.2   Bilirubin Total 0.6 0.0 - 1.2 mg/dL   Alkaline Phosphatase 117 48 - 121 IU/L    Comment:               **Please note reference interval change**   AST 23 0 - 40 IU/L   ALT 26 0 - 44 IU/L  CBC w/Diff/Platelet     Status: Abnormal   Collection Time: 05/15/20 10:23 AM  Result Value Ref Range   WBC 6.2  3.4 - 10.8 x10E3/uL   RBC 5.68 4.14 - 5.80 x10E6/uL   Hemoglobin 13.7 13.0 - 17.7 g/dL   Hematocrit 44.4 37.5 - 51.0 %   MCV 78 (L) 79 - 97 fL   MCH 24.1 (L) 26.6 - 33.0 pg   MCHC 30.9 (L) 31 - 35 g/dL   RDW 16.5 (H) 11.6 - 15.4 %   Platelets 157 150 - 450 x10E3/uL   Neutrophils 47 Not Estab. %   Lymphs 39 Not Estab. %   Monocytes 10 Not Estab. %   Eos 3 Not Estab. %   Basos 1 Not Estab. %   Neutrophils Absolute 2.9 1 - 7 x10E3/uL   Lymphocytes Absolute 2.4 0 - 3 x10E3/uL   Monocytes Absolute 0.6 0 - 0 x10E3/uL   EOS (ABSOLUTE) 0.2 0.0 - 0.4 x10E3/uL   Basophils Absolute 0.0 0 - 0 x10E3/uL   Immature Granulocytes 0 Not Estab. %   Immature Grans (Abs) 0.0 0.0 - 0.1 x10E3/uL  Hemoglobin A1C     Status: Abnormal   Collection Time: 05/15/20 10:23 AM  Result Value Ref Range   Hgb A1c MFr Bld 7.7 (H) 4.8 - 5.6 %    Comment:          Prediabetes: 5.7 - 6.4          Diabetes: >6.4          Glycemic control for adults with diabetes: <7.0    Est. average glucose Bld gHb Est-mCnc 174 mg/dL  Calcium, ionized     Status: Abnormal   Collection Time: 05/19/20  2:34 PM  Result Value Ref Range   Calcium, Ion 6.2 (H) 4.5 - 5.6 mg/dL  PTH, intact and calcium     Status: Abnormal   Collection Time: 05/19/20  2:34 PM  Result Value Ref Range   Calcium 11.6 (H) 8.6 - 10.2 mg/dL   PTH 53 15 - 65 pg/mL   PTH Interp Comment     Comment: Interpretation                 Intact PTH    Calcium                                 (pg/mL)      (mg/dL) Normal                          15 - 65     8.6 - 10.2 Primary  Hyperparathyroidism         >65          >10.2 Secondary Hyperparathyroidism       >65          <10.2 Non-Parathyroid Hypercalcemia       <65          >10.2 Hypoparathyroidism                  <15          < 8.6 Non-Parathyroid Hypocalcemia    15 - 65          < 8.6   Vitamin B12     Status: None   Collection Time: 05/19/20  2:34 PM  Result Value Ref Range   Vitamin B-12 474 232 - 1,245 pg/mL  Iron, TIBC and Ferritin Panel     Status: Abnormal   Collection Time: 05/19/20  2:34 PM  Result Value Ref Range   Total Iron Binding Capacity 453 (H) 250 - 450 ug/dL   UIBC 371 (H) 111 - 343 ug/dL   Iron 82 38 - 169 ug/dL   Iron Saturation 18 15 - 55 %   Ferritin 22 (L) 30.0 - 400.0 ng/mL  Protein Electrophoresis, (serum)     Status: None   Collection Time: 05/29/20  9:56 AM  Result Value Ref Range   Total Protein 6.5 6.0 - 8.5 g/dL   Albumin ELP 3.4 2.9 - 4.4 g/dL   Alpha 1 0.2 0.0 - 0.4 g/dL   Alpha 2 0.8 0.4 - 1.0 g/dL   Beta 1.1 0.7 - 1.3 g/dL   Gamma Globulin 1.0 0.4 - 1.8 g/dL   M-Spike, % Not Observed Not Observed g/dL   Globulin, Total 3.1 2.2 - 3.9 g/dL   A/G Ratio 1.1 0.7 - 1.7   Please Note: Comment     Comment: Protein electrophoresis scan will follow via computer, mail, or courier delivery.    Interpretation: Comment     Comment: The SPE pattern appears unremarkable. Evidence of monoclonal protein is not apparent.   Vitamin D (25 hydroxy)     Status: None   Collection Time: 05/29/20  9:56 AM  Result Value Ref Range   Vit D, 25-Hydroxy 40.4 30.0 - 100.0 ng/mL    Comment: Vitamin D deficiency has been defined by the [place] practice guideline as a level of serum 25-OH vitamin D less than 20 ng/mL (1,2). The Endocrine Society went on to further define vitamin D insufficiency as a level between 21 and 29 ng/mL (2). 1. IOM (Institute of Medicine). 2010. Dietary reference    intakes for calcium and D. [place]: The    [employer]. 2. Holick MF, Binkley Starr, Bischoff-Ferrari HA, et al.    Evaluation, treatment, and prevention of vitamin D    deficiency: an Endocrine Society clinical practice    guideline. JCEM. 2011 Jul; 96(7):1911-30.   Calcium, ionized     Status: Abnormal   Collection Time: 05/29/20  9:56 AM  Result Value Ref Range   Calcium, Ion 6.0 (H) 4.5 - 5.6 mg/dL  PTH, Intact and Calcium     Status: Abnormal   Collection Time: 05/29/20  9:56 AM  Result Value Ref Range   Calcium 11.4 (H) 8.6 - 10.2 mg/dL   PTH 41 15 - 65 pg/mL   PTH Interp Comment     Comment: Interpretation                 Intact PTH    Calcium                                 (pg/mL)      (mg/dL) Normal                          15 - 65     8.6 - 10.2 Primary Hyperparathyroidism         >65          >10.2 Secondary Hyperparathyroidism       >65          <10.2 Non-Parathyroid Hypercalcemia       <65          >10.2 Hypoparathyroidism                  <  15          < 8.6 Non-Parathyroid Hypocalcemia    15 - 65          < 8.6   Comprehensive metabolic panel     Status: Abnormal   Collection Time: 06/05/20  2:44 PM  Result Value Ref Range   Sodium 139 135 - 145 mmol/L   Potassium 3.4 (L) 3.5 - 5.1 mmol/L   Chloride 102 98 - 111 mmol/L   CO2 30 22 - 32 mmol/L   Glucose, Bld 179 (H) 70 - 99 mg/dL    Comment: Glucose reference range applies only to samples taken after fasting for at least 8 hours.   BUN 25 (H) 8 - 23 mg/dL   Creatinine, Ser 1.39 (H) 0.61 - 1.24 mg/dL   Calcium 10.4 (H) 8.9 - 10.3 mg/dL   Total Protein 6.6 6.5 - 8.1 g/dL   Albumin 3.5 3.5 - 5.0 g/dL   AST 26 15 - 41 U/L   ALT 28 0 - 44 U/L   Alkaline Phosphatase 93 38 - 126 U/L   Total Bilirubin 0.7 0.3 - 1.2 mg/dL   GFR calc non Af Amer 51 (L) >60 mL/min   GFR calc Af Amer 59 (L) >60 mL/min   Anion gap 7 5 - 15    Comment: Performed at [facility], [place], [place] [address]  Troponin I (High Sensitivity)     Status: None    Collection Time: 06/05/20  2:44 PM  Result Value Ref Range   Troponin I (High Sensitivity) 7 <18 ng/L    Comment: (NOTE) Elevated high sensitivity troponin I (hsTnI) values and significant  changes across serial measurements may suggest ACS but many other  chronic and acute conditions are known to elevate hsTnI results.  Refer to the "Links" section for chest pain algorithms and additional  guidance. Performed at [facility], [address] [place], [place] [address]   Lactic acid, plasma     Status: None   Collection Time: 06/05/20  2:44 PM  Result Value Ref Range   Lactic Acid, Venous 1.9 0.5 - 1.9 mmol/L    Comment: Performed at [facility], [address] [place], [place] [address]  CBC with Differential     Status: Abnormal   Collection Time: 06/05/20  2:44 PM  Result Value Ref Range   WBC 6.2 4.0 - 10.5 K/uL   RBC 5.14 4.22 - 5.81 MIL/uL   Hemoglobin 12.8 (L) 13.0 - 17.0 g/dL   HCT 41.3 39 - 52 %   MCV 80.4 80.0 - 100.0 fL   MCH 24.9 (L) 26.0 - 34.0 pg   MCHC 31.0 30.0 - 36.0 g/dL   RDW 16.9 (H) 11.5 - 15.5 %   Platelets 115 (L) 150 - 400 K/uL    Comment: Immature Platelet Fraction may be clinically indicated, consider ordering this additional test [id number]    nRBC 0.0 0.0 - 0.2 %   Neutrophils Relative % 58 %   Neutro Abs 3.5 1.7 - 7.7 K/uL   Lymphocytes Relative 30 %   Lymphs Abs 1.9 0.7 - 4.0 K/uL   Monocytes Relative 9 %   Monocytes Absolute 0.6 0 - 1 K/uL   Eosinophils Relative 3 %   Eosinophils Absolute 0.2 0 - 0 K/uL   Basophils Relative 0 %   Basophils Absolute 0.0 0 - 0 K/uL   WBC Morphology MORPHOLOGY UNREMARKABLE    RBC Morphology MORPHOLOGY  UNREMARKABLE    Smear Review Normal platelet morphology    Immature Granulocytes 0 %   Abs Immature Granulocytes 0.01 0.00 - 0.07 K/uL    Comment: Performed at [facility], [address] [place], [place] [address]  TSH     Status: Abnormal   Collection Time: 06/05/20   2:44 PM  Result Value Ref Range   TSH 0.034 (L) 0.350 - 4.500 uIU/mL    Comment: Performed by a 3rd Generation assay with a functional sensitivity of <=0.01 uIU/mL. Performed at [facility], [address] [place], [place] [address]   CK     Status: None   Collection Time: 06/05/20  2:44 PM  Result Value Ref Range   Total CK 153 49.0 - 397.0 U/L    Comment: Performed at [facility], [address] [place], [place] [address]  Glucose, capillary     Status: Abnormal   Collection Time: 06/05/20  2:51 PM  Result Value Ref Range   Glucose-Capillary 197 (H) 70 - 99 mg/dL    Comment: Glucose reference range applies only to samples taken after fasting for at least 8 hours.  Lactic acid, plasma     Status: None   Collection Time: 06/05/20  6:49 PM  Result Value Ref Range   Lactic Acid, Venous 1.5 0.5 - 1.9 mmol/L    Comment: Performed at [facility], [place], [place] [address]  Troponin I (High Sensitivity)     Status: None   Collection Time: 06/05/20  6:49 PM  Result Value Ref Range   Troponin I (High Sensitivity) 6 <18 ng/L    Comment: (NOTE) Elevated high sensitivity troponin I (hsTnI) values and significant  changes across serial measurements may suggest ACS but many other  chronic and acute conditions are known to elevate hsTnI results.  Refer to the "Links" section for chest pain algorithms and additional  guidance. Performed at [facility], [address] [place], [place] [address]   SARS Coronavirus 2 by RT PCR (hospital order, performed in [facility] hospital lab) Nasopharyngeal Nasopharyngeal Swab     Status: None   Collection Time: 06/05/20  8:54 PM   Specimen: Nasopharyngeal Swab  Result Value Ref Range   SARS Coronavirus 2 NEGATIVE NEGATIVE    Comment: (NOTE) SARS-CoV-2 target nucleic acids are NOT DETECTED.  The SARS-CoV-2 RNA is generally detectable in upper and lower respiratory specimens during the  acute phase of infection. The lowest concentration of SARS-CoV-2 viral copies this assay can detect is 250 copies / mL. A negative result does not preclude SARS-CoV-2 infection and should not be used as the sole basis for treatment or other patient management decisions.  A negative result may occur with improper specimen collection / handling, submission of specimen other than nasopharyngeal swab, presence of viral mutation(s) within the areas targeted by this assay, and inadequate number of viral copies (<250 copies / mL). A negative result must be combined with clinical observations, patient history, and epidemiological information.  Fact Sheet for Patients:   StrictlyIdeas.no  Fact Sheet for Healthcare Providers: BankingDealers.co.za  This test is not yet approved or  cleared by the [place] FDA and has been authorized for detection and/or diagnosis of SARS-CoV-2 by FDA under an Emergency Use Authorization (EUA).  This EUA will remain in effect (meaning this test can be used) for the duration of the COVID-19 declaration under Section 564(b)(1) of the Act, 21 U.S.C. section 360bbb-3(b)(1), unless the authorization  is terminated or revoked sooner.  Performed at [facility], [address] [place], [place] [address]   Glucose, capillary     Status: Abnormal   Collection Time: 06/05/20 10:15 PM  Result Value Ref Range   Glucose-Capillary 282 (H) 70 - 99 mg/dL    Comment: Glucose reference range applies only to samples taken after fasting for at least 8 hours.  HIV Antibody (routine testing w rflx)     Status: None   Collection Time: 06/06/20  4:30 AM  Result Value Ref Range   HIV Screen 4th Generation wRfx Non Reactive Non Reactive    Comment: Performed at [facility], [address] [place], [place] [address]  Basic metabolic panel     Status: Abnormal   Collection Time: 06/06/20  4:30 AM  Result Value Ref  Range   Sodium 141 135 - 145 mmol/L   Potassium 3.7 3.5 - 5.1 mmol/L   Chloride 104 98 - 111 mmol/L   CO2 33 (H) 22 - 32 mmol/L   Glucose, Bld 172 (H) 70 - 99 mg/dL    Comment: Glucose reference range applies only to samples taken after fasting for at least 8 hours.   BUN 21 8 - 23 mg/dL   Creatinine, Ser 1.17 0.61 - 1.24 mg/dL   Calcium 10.2 8.9 - 10.3 mg/dL   GFR calc non Af Amer >60 >60 mL/min   GFR calc Af Amer >60 >60 mL/min   Anion gap 4 (L) 5 - 15    Comment: Performed at [facility], [address] [place], [place] [address]  CBC     Status: Abnormal   Collection Time: 06/06/20  4:30 AM  Result Value Ref Range   WBC 4.9 4.0 - 10.5 K/uL   RBC 4.99 4.22 - 5.81 MIL/uL   Hemoglobin 12.6 (L) 13.0 - 17.0 g/dL   HCT 39.6 39 - 52 %   MCV 79.4 (L) 80.0 - 100.0 fL   MCH 25.3 (L) 26.0 - 34.0 pg   MCHC 31.8 30.0 - 36.0 g/dL   RDW 17.2 (H) 11.5 - 15.5 %   Platelets 97 (L) 150 - 400 K/uL    Comment: Immature Platelet Fraction may be clinically indicated, consider ordering this additional test [id number]    nRBC 0.0 0.0 - 0.2 %    Comment: Performed at [facility], [address] [place], [place] [address]  Glucose, capillary     Status: Abnormal   Collection Time: 06/06/20  8:07 AM  Result Value Ref Range   Glucose-Capillary 158 (H) 70 - 99 mg/dL    Comment: Glucose reference range applies only to samples taken after fasting for at least 8 hours.   Comment 1 Notify RN   Glucose, capillary     Status: Abnormal   Collection Time: 06/06/20 11:43 AM  Result Value Ref Range   Glucose-Capillary 164 (H) 70 - 99 mg/dL    Comment: Glucose reference range applies only to samples taken after fasting for at least 8 hours.   Comment 1 Notify RN   ECHOCARDIOGRAM COMPLETE     Status: None   Collection Time: 06/06/20  1:13 PM  Result Value Ref Range   Weight 4,208 oz   Height 70 in   BP 153/71 mmHg  Glucose, capillary     Status: Abnormal   Collection Time:  06/06/20  3:33 PM  Result Value Ref Range   Glucose-Capillary 197 (H) 70 - 99 mg/dL  Comment: Glucose reference range applies only to samples taken after fasting for at least 8 hours.   Comment 1 Notify RN     Objective: General: Patient is awake, alert, and oriented x 3 and in no acute distress.  Integument: Skin is warm, dry and supple bilateral.  Nails are short and thick, consistent with onychomycosis, 1-5 bilateral. No signs of infection. No open lesions or preulcerative lesions present bilateral. Resolved contusion at left great toe but there is still swelling diffusely bilateral lower extremities. Remaining integument unremarkable.  Vasculature:  Dorsalis Pedis pulse 1/4 bilateral. Posterior Tibial pulse  0/4 bilateral. Capillary fill time <3 sec 1-5 bilateral. No hair growth to the level of the digits.Temperature gradient within normal limits. No varicosities present bilateral. 1+ pitting edema present bilateral. +Stucco dermatitis due to PVD without infection.   Neurology: Protective sensation absent with Thornell Mule monofilament to all pedal sites bilateral.  Musculoskeletal: Mild pain with palpation to left first toe.  Asymptomatic hammertoes pedal deformities noted bilateral with prominent digital contractures/bone at right hallux IPJ. Muscular strength 5/5 in all lower extremity muscular groups bilateral without pain on range of motion except subjectively when he is doing a lot of walking and standing at home and going without surgical shoe and trying to bend or crouch. No tenderness with calf compression bilateral.  Xrays: As below Assessment and Plan: Problem List Items Addressed This Visit    None    Visit Diagnoses    Closed displaced fracture of distal phalanx of left great toe with routine healing, subsequent encounter    -  Primary   Relevant Orders   DG Foot Complete Left   Diabetic polyneuropathy associated with type 2 diabetes mellitus (HCC)       PAD  (peripheral artery disease) (HCC)       Contusion of left foot, subsequent encounter       Hammer toe, unspecified laterality         -Examined patient. -Discussed continued care for left hallux fracture with mild comminution and evidence of osteophyte healing however the proximal phalanx is slightly rotated on the sagittal view but fracture is still well opposed for healing -Recommend continue with post op shoe for left toe fracture until next visit and to avoid walking barefoot or without the shoe to decrease any pressure to the hallux -Dispensed toe cap for patient to use on right hallux since this is rubbing when he is in his normal tennis shoe with mild increase in contracture of hammertoe -Advised patient to wear compression garments and to modify current garments to prevent rubbing to toe since both lower extremities are edematous -Answered all patient questions -Patient to return in 4-5 weeks for xray of left foot toe fracture, advised patient if still feels to be healed may request bone stimulator at next visit -Patient advised to call the office if any problems or questions arise in the meantime.  Landis Martins, DPM

## 2020-06-18 ENCOUNTER — Other Ambulatory Visit: Payer: Self-pay | Admitting: Family Medicine

## 2020-06-18 DIAGNOSIS — I480 Paroxysmal atrial fibrillation: Secondary | ICD-10-CM

## 2020-06-18 MED ORDER — APIXABAN 5 MG PO TABS: 5.0000 mg | ORAL_TABLET | Freq: Two times a day (BID) | ORAL | 2 refills | Status: DC

## 2020-06-18 NOTE — Telephone Encounter (Signed)
Medication Refill - Medication: Eliquis   Has the patient contacted their pharmacy? Yes.   (Agent: If no, request that the patient contact the pharmacy for the refill.) (Agent: If yes, when and what did the pharmacy advise?)  Preferred Pharmacy (with phone number or street name):  [facility], [place] [address] [place] [address]  Phone: [phone] Fax: [phone]  Hours: Not open 24 hours     Agent: Please be advised that RX refills may take up to 3 business days. We ask that you follow-up with your pharmacy.

## 2020-06-18 NOTE — Telephone Encounter (Signed)
Requested medication (s) are due for refill today:  Yes  Requested medication (s) are on the active medication list:  Yes  Future visit scheduled:  No  Last Refill: ordered by Hospitalist ; 06/06/20; #60; no refills  Note to clinic: last hgb. and plt. low; hgb. 12.6, platelets 97  on 6/11  Requested Prescriptions  Pending Prescriptions Disp Refills   apixaban (ELIQUIS) 5 MG TABS tablet 60 tablet 0    Sig: Take 1 tablet (5 mg total) by mouth 2 (two) times daily.      Hematology:  Anticoagulants Failed - 06/18/2020  9:26 AM      Failed - HGB in normal range and within 360 days    Hemoglobin  Date Value Ref Range Status  06/06/2020 12.6 (L) 13.0 - 17.0 g/dL Final  05/15/2020 13.7 13.0 - 17.7 g/dL Final          Failed - PLT in normal range and within 360 days    Platelets  Date Value Ref Range Status  06/06/2020 97 (L) 150 - 400 K/uL Final    Comment:    Immature Platelet Fraction may be clinically indicated, consider ordering this additional test [id number]   05/15/2020 157 150 - 450 x10E3/uL Final          Passed - HCT in normal range and within 360 days    HCT  Date Value Ref Range Status  06/06/2020 39.6 39 - 52 % Final   Hematocrit  Date Value Ref Range Status  05/15/2020 44.4 37.5 - 51.0 % Final          Passed - Cr in normal range and within 360 days    Creat  Date Value Ref Range Status  12/13/2017 1.39 (H) 0.70 - 1.25 mg/dL Final    Comment:    For patients >73 years of age, the reference limit for Creatinine is approximately 13% higher for people identified as African-American. .    Creatinine, Ser  Date Value Ref Range Status  06/06/2020 1.17 0.61 - 1.24 mg/dL Final          Passed - Valid encounter within last 12 months    Recent Outpatient Visits           1 week ago Typical atrial flutter [facility])   [facility] Jerrol Banana., MD   2 weeks ago Hypercalcemia   [facility] Jerrol Banana., MD   1  month ago Diabetes mellitus type 2, uncontrolled, with complications [facility])   [facility] Jerrol Banana., MD   3 months ago RLS (restless legs syndrome)   [facility] Jerrol Banana., MD   4 months ago Diabetes mellitus type 2, uncontrolled, with complications [facility])   [facility] Jerrol Banana., MD       Future Appointments             In 1 month Gollan, Kathlene November, MD [facility], [facility]

## 2020-06-19 ENCOUNTER — Other Ambulatory Visit: Payer: Self-pay | Admitting: Family Medicine

## 2020-06-19 DIAGNOSIS — G629 Polyneuropathy, unspecified: Secondary | ICD-10-CM

## 2020-06-20 ENCOUNTER — Other Ambulatory Visit: Payer: Self-pay | Admitting: Family Medicine

## 2020-06-20 NOTE — Telephone Encounter (Signed)
Requested medication (s) are due for refill today: yes  Requested medication (s) are on the active medication list: yes  Last refill: 05/27/20  Future visit scheduled: yes  Notes to clinic:  historical provider    Requested Prescriptions  Pending Prescriptions Disp Refills   rOPINIRole (REQUIP) 1 MG tablet [Pharmacy Med Name: ROPINIROLE HCL 1 MG TABLET] 60 tablet 1    Sig: TAKE 1 TO 2 TABLETS BY MOUTH AT BEDTIME AS NEEDED      Neurology:  Parkinsonian Agents Passed - 06/20/2020 10:30 AM      Passed - Last BP in normal range    BP Readings from Last 1 Encounters:  06/11/20 128/62          Passed - Valid encounter within last 12 months    Recent Outpatient Visits           1 week ago Typical atrial flutter [facility])   [facility] Jerrol Banana., MD   3 weeks ago Hypercalcemia   [facility] Jerrol Banana[facility] MD   1 month ago Diabetes mellitus type 2, uncontrolled, with complications [facility])   [facility] Jerrol Banana., MD   3 months ago RLS (restless legs syndrome)   [facility] Jerrol Banana., MD   4 months ago Diabetes mellitus type 2, uncontrolled, with complications [facility])   [facility] Jerrol Banana., MD       Future Appointments             In 1 month Gollan, Kathlene November, MD [facility], [facility]

## 2020-06-23 ENCOUNTER — Other Ambulatory Visit
Admission: RE | Admit: 2020-06-23 | Discharge: 2020-06-23 | Disposition: A | Discharge status: Home / Self Care | Payer: Medicare Other | Attending: General Surgery | Admitting: General Surgery

## 2020-06-23 DIAGNOSIS — D351 Benign neoplasm of parathyroid gland: Secondary | ICD-10-CM | POA: Insufficient documentation

## 2020-06-23 LAB — BASIC METABOLIC PANEL
Anion gap: 8 (ref 5–15)
BUN: 23 mg/dL (ref 8–23)
CO2: 31 mmol/L (ref 22–32)
Calcium: 11.1 mg/dL — ABNORMAL HIGH (ref 8.9–10.3)
Chloride: 101 mmol/L (ref 98–111)
Creatinine, Ser: 1.27 mg/dL — ABNORMAL HIGH (ref 0.61–1.24)
GFR calc Af Amer: >60 mL/min (ref >60)
GFR calc non Af Amer: 57 mL/min — ABNORMAL LOW (ref >60)
Glucose, Bld: 171 mg/dL — ABNORMAL HIGH (ref 70–99)
Potassium: 4.6 mmol/L (ref 3.5–5.1)
Sodium: 140 mmol/L (ref 135–145)

## 2020-06-25 ENCOUNTER — Other Ambulatory Visit
Admission: RE | Admit: 2020-06-25 | Discharge: 2020-06-25 | Disposition: A | Discharge status: Home / Self Care | Payer: Medicare Other | Source: Ambulatory Visit | Attending: General Surgery | Admitting: General Surgery

## 2020-06-25 DIAGNOSIS — D351 Benign neoplasm of parathyroid gland: Secondary | ICD-10-CM | POA: Insufficient documentation

## 2020-06-25 LAB — CREATININE, URINE, 24 HOUR
Collection Interval-UCRE24: 24 hours
Creatinine, 24H Ur: 1606 mg/d (ref 800–2000)
Creatinine, Urine: 73 mg/dL
Urine Total Volume-UCRE24: 2200 mL

## 2020-06-26 LAB — CALCIUM, URINE, 24 HOUR
Calcium, 24 hour urine: 383 mg/24 hr — ABNORMAL HIGH (ref 47–462)
Calcium, Ur: 17.4 mg/dL
Total Volume: 2200

## 2020-06-27 ENCOUNTER — Other Ambulatory Visit: Payer: Self-pay | Admitting: Family Medicine

## 2020-06-27 DIAGNOSIS — E118 Type 2 diabetes mellitus with unspecified complications: Secondary | ICD-10-CM

## 2020-06-27 NOTE — Telephone Encounter (Signed)
  Notes to clinic Historical provider   

## 2020-06-30 NOTE — Progress Notes (Signed)
Cardiology Office Note    Date:  07/02/2020   ID:  Jerry Parsons, DOB 1950-12-31, MRN [id number]  PCP:  Jerrol Banana., MD  Cardiologist:  Ida Rogue, MD  Electrophysiologist:  None   Chief Complaint: Hospital follow-up  History of Present Illness:   Jerry Parsons is a 69 y.o. male with history of remote A. 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  Remote cardiac cath in the 1990s with "minimal blockage." Prior note indicates a history of atrial flutter with cardioversion in 02/2013. He has been maintained on oral amiodarone without OAC without documented recurrence of arrhythmia. Several hospitalizations over the years for pneumonia, Salmonella diarrhea, and bacteremia. Echo during admission in 03/2018 showed an EF of 55 to 25%, normal diastolic function, mild biatrial enlargement, no evidence of valvular vegetation. At his last follow-up in 06/2019 he denied any symptoms concerning for recurrence of palpitations and was continued on oral amiodarone without OAC.  He has been evaluated for hypercalcemia recently with subsequent diagnosis of parathyroid adenoma with plans for surgical resection in the near future.    He was recently admitted to [facility] from 6/10-6/11 after being found in a "low pressure state" in his car. Note indicates it was felt the patient did not have a good night sleep the night  before and was exhausted, with him "probably" sleeping in his car. CT head showed no acute intracranial abnormality. MRI of the brain showed no acute intracranial abnormality. Echo showed an EF of 60-65%, mild LVH, no RWMA, moderately dilated left atrium, normal RVSF, RV cavity size, normal PASP, and no significant valvular abnormalities. EKG and telemetry showed rate controlled atrial flutter. High-sensitivity troponin negative x2. He was started on Eliquis given a CHADS2VASc of at least 4 (CHF, HTN, age x1, vascular disease). We did not see him during this admission.   Patient is accompanied by his wife today.  He is doing well from a cardiac perspective.  They both indicate to me the patient has been quite stressed over his recent diagnosis of hypercalcemia and has not been sleeping well.  On the night prior to his hospital admission he did not sleep at all as he was stressing over his upcoming surgical appointment the next day.  Following this appointment he was quite tired and drove home.  He drove his car and parked in the garage, turned the car off, and left the garage door open.  After that, he does not remember anything until he was woken up by his wife, neighbor, and EMS.  They indicate the neighbors son came over to see what the patient was working on in his garage with the door open and the son found the patient to be unresponsive inside the car with the windows up.  Patient's wife indicates the temperature was well over 100 degrees.  In this setting, the patient son when he got his mother he contacted EMS.  Patient is uncertain if he was asleep for without consciousness.  Patient's wife feels quite certain he was just asleep.  Patient denies any symptoms of depression or suicidal ideation.  They indicate he was in this state for approximately 2 hours.  He has not had any further recurrence of episodes.  He is tolerating anticoagulation without issues.  No falls or melena.  He does note some occasional  BRBPR that is not new and has attributed this to hemorrhoids.  He has a stable mild lower extremity swelling.  He reports he was feeling well bleeding up to the above events in his garage and denies any chest pain, palpitations, increased flushing, dyspnea, or dizziness.  He does not have any issues or concerns at this time.   Labs independently reviewed: 05/2020 - potassium 4.6, BUN 23, serum creatinine 1.27, Hgb 12.6, PLT 97, TSH low at 0.034, albumin 3.5, AST/ALT normal 04/2020 - A1c 7.7, TC 134, TG 233, HDL 29, LDL 67  Past Medical History:  Diagnosis Date  . Arrhythmia    tachycardia, A-Fib  . Arthritis   . Blind right eye   . BPH (benign prostatic hyperplasia)   . Diabetes mellitus   . DVT (deep venous thrombosis) ([place]) 02/2010   leg thrombus ; dislodged into emboli and caused PE  . Dyspnea   . Dysrhythmia   . 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 GERD (gastroesophageal reflux disease)   . Hypercholesteremia   . Hypertension   . Kidney failure   . Neuromuscular disorder ([place])   . Neuropathy   . Pneumonia    time 9 ;last episode 12/2015  . Pulmonary embolus ([place]) 2011  . Seasonal allergies   . Seizures ([place])    as child   . Sleep apnea    BIPAP  . Stiff neck    limited turning s/p titanium plate placement  . TIA (transient ischemic attack)   . Wears dentures    full upper and lower    Past Surgical History:  Procedure Laterality Date  . APPENDECTOMY    . BACK SURGERY     x 8; upper x 3 & lower x 5  . CARDIOVERSION  03/14/13, 10/16   2014 - [place], 2016 - [place]  . CATARACT EXTRACTION W/PHACO Left 10/29/2015   Procedure: CATARACT EXTRACTION PHACO AND INTRAOCULAR LENS PLACEMENT (IOC);  Surgeon: Leandrew Koyanagi, MD;  Location: [place];  Service: Ophthalmology;  Laterality: Left;  DIABETIC - insulin and oral medsSleep apnea - no machine  . CHOLECYSTECTOMY    . COLONOSCOPY WITH PROPOFOL N/A 01/16/2018   Procedure: COLONOSCOPY WITH PROPOFOL;   Surgeon: Manya Silvas, MD;  Location[facility] ENDOSCOPY;  Service: Endoscopy;  Laterality: N/A;  . ESOPHAGOGASTRODUODENOSCOPY (EGD) WITH PROPOFOL N/A 01/16/2018   Procedure: ESOPHAGOGASTRODUODENOSCOPY (EGD) WITH PROPOFOL;  Surgeon: Manya Silvas, MD;  Location: [facility] ENDOSCOPY;  Service: Endoscopy;  Laterality: N/A;  . EYE SURGERY    . GALLBLADDER SURGERY  2002  . JOINT REPLACEMENT Right 2018  . KNEE ARTHROSCOPY     left   . ROTATOR CUFF REPAIR  2001   left   . TEE WITHOUT CARDIOVERSION N/A 04/26/2018   Procedure: TRANSESOPHAGEAL ECHOCARDIOGRAM (TEE);  Surgeon: Nelva Bush, MD;  Location: [facility] ORS;  Service: Cardiovascular;  Laterality: N/A;  . TONSILLECTOMY    . TOTAL KNEE ARTHROPLASTY Right 06/20/2017   Procedure: RIGHT TOTAL KNEE ARTHROPLASTY;  Surgeon: Gaynelle Arabian, MD;  Location: [facility] ORS;  Service: Orthopedics;  Laterality: Right;    Current Medications: Current Meds  Medication Sig  . amiodarone (PACERONE) 200 MG tablet TAKE  1 TABLET BY MOUTH EVERY DAY (Patient taking differently: Take 200 mg by mouth daily. )  . apixaban (ELIQUIS) 5 MG TABS tablet Take 1 tablet (5 mg total) by mouth 2 (two) times daily.  . cholecalciferol (VITAMIN D3) 25 MCG (1000 UT) tablet Take 1,000 Units by mouth daily.  . cyclobenzaprine (FLEXERIL) 10 MG tablet TAKE 1 TABLET BY MOUTH 3 TIMES A DAY (Patient taking differently: Take 10 mg by mouth 3 (three) times daily. )  . docusate sodium (COLACE) 250 MG capsule Take 250 mg by mouth daily as needed.   . dutasteride (AVODART) 0.5 MG capsule TAKE 1 CAPSULE DAILY (Patient taking differently: Take 0.5 mg by mouth daily. )  . esomeprazole (NEXIUM) 40 MG capsule Take 1 capsule (40 mg total) by mouth 2 (two) times daily.  . fexofenadine (ALLEGRA) 180 MG tablet Take 180 mg by mouth daily.    . furosemide (LASIX) 40 MG tablet Take 40 mg by mouth daily.  [name] gabapentin (NEURONTIN) 100 MG capsule TAKE 1 CAPSULE BY MOUTH TWICE A DAY  . hydrochlorothiazide  (HYDRODIURIL) 12.5 MG tablet TAKE 2 TABLETS (25 MG TOTAL) BY MOUTH AT BEDTIME.  . Insulin Pen Needle (PEN NEEDLES) 31G X 5 MM MISC 1 each by Does not apply route daily. PATIENT NEEDS NOVA TWIST NEEDLES 5 MM  DX E11.9  . JANUVIA 100 MG tablet TAKE 1 TABLET DAILY (Patient taking differently: Take 100 mg by mouth daily. )  . LANTUS SOLOSTAR 100 UNIT/ML Solostar Pen Inject 38 Units into the skin 2 (two) times daily.  [name] liraglutide (VICTOZA) 18 MG/3ML SOPN INJECT 0.3ML (=1.8MG )      SUBCUTANEOUSLY DAILY  . magnesium oxide (MAG-OX) 400 MG tablet TAKE 1 TABLET (400 MG TOTAL) BY MOUTH 2 (TWO) TIMES DAILY. (Patient taking differently: Take 400 mg by mouth 2 (two) times daily. )  . Melatonin Gummies 2.5 MG CHEW Chew by mouth as needed.  . metoprolol tartrate (LOPRESSOR) 100 MG tablet TAKE 1 TABLET (100 MG TOTAL) BY MOUTH 2 (TWO) TIMES DAILY.  . Naloxone HCl 0.4 MG/0.4ML SOAJ Inject as needed for suspected overdose.  [name] NOVOLOG FLEXPEN 100 UNIT/ML FlexPen INJECT 12 UNITS            SUBCUTANEOUSLY 3 TIMES A   DAY WITH MEALS (Patient taking differently: 12 Units in the morning and at bedtime. )  . NOVOTWIST 32G X 5 MM MISC USE 6 TIMES A DAY DX E11.9  . Omega-3 Fatty Acids (FISH OIL) 1200 MG CAPS Take 1,200 mg by mouth 2 (two) times daily.   Glory Rosebush ULTRA test strip USE WITH METER TWICE DAILY  . oxyCODONE (OXYCONTIN) 40 mg 12 hr tablet Take 1 tablet (40 mg total) by mouth every 8 (eight) hours as needed.  . pregabalin (LYRICA) 200 MG capsule TAKE 1 CAPSULE 3 TIMES A   DAY (Patient taking differently: Take 200 mg by mouth 3 (three) times daily. )  . rOPINIRole (REQUIP) 1 MG tablet TAKE 1 TO 2 TABLETS BY MOUTH AT BEDTIME AS NEEDED (Patient taking differently: Take 1 mg by mouth in the morning. )  . rOPINIRole (REQUIP) 2 MG tablet Take 1 tablet twice a day as needed. (Patient taking differently: Take 2 mg by mouth at bedtime. )  . sucralfate (CARAFATE) 1 g tablet TAKE 1 TABLET BY MOUTH 4 (FOUR) TIMES DAILY - WITH  MEALS AND AT BEDTIME. (Patient taking differently: Take 1 g by mouth 4 (four) times daily. )  . testosterone cypionate (DEPOTESTOSTERONE CYPIONATE)  200 MG/ML injection Inject 200 mg into the muscle every 21 ( twenty-one) days.     Allergies:   Carisoprodol and Tamsulosin   Social History   Socioeconomic History  . Marital status: Married    Spouse name: Not on file  . Number of children: 1  . Years of education: Not on file  . Highest education level: 6th grade  Occupational History  . Occupation: retired  Tobacco Use  . Smoking status: Former Smoker    Packs/day: 2.00    Years: 20.00    Pack years: 40.00    Types: Cigarettes    Quit date: 12/26/1989    Years since quitting: 30.5  . Smokeless tobacco: Never Used  Vaping Use  . Vaping Use: Never used  Substance and Sexual Activity  . Alcohol use: No  . Drug use: No  . Sexual activity: Not on file  Other Topics Concern  . Not on file  Social History Narrative  . Not on file   Social Determinants of Health   Financial Resource Strain:   . Difficulty of Paying Living Expenses:   Food Insecurity:   . Worried About Charity fundraiser in the Last Year:   . Arboriculturist in the Last Year:   Transportation Needs:   . Film/video editor (Medical):   [name] Lack of Transportation (Non-Medical):   Physical Activity: Inactive  . Days of Exercise per Week: 0 days  . Minutes of Exercise per Session: 0 min  Stress: No Stress Concern Present  . Feeling of Stress : Not at all  Social Connections:   . Frequency of Communication with Friends and Family:   . Frequency of Social Gatherings with Friends and Family:   . Attends Religious Services:   . Active Member of Clubs or Organizations:   . Attends Archivist Meetings:   [name] Marital Status:      Family History:  The patient's family history includes Heart attack in his brother.  ROS:   Review of Systems  Constitutional: Positive for malaise/fatigue. Negative  for chills, diaphoresis, fever and weight loss.  HENT: Negative for congestion.   Eyes: Negative for discharge and redness.  Respiratory: Negative for cough, sputum production, shortness of breath and wheezing.   Cardiovascular: Negative for chest pain, palpitations, orthopnea, claudication, leg swelling and PND.  Gastrointestinal: Negative for abdominal pain, blood in stool, heartburn, melena, nausea and vomiting.  Musculoskeletal: Negative for falls and myalgias.  Skin: Negative for rash.  Neurological: Positive for weakness. Negative for dizziness, tingling, tremors, sensory change, speech change, focal weakness and loss of consciousness.  Endo/Heme/Allergies: Does not bruise/bleed easily.       Heat intolerance   Psychiatric/Behavioral: Negative for depression, hallucinations, memory loss, substance abuse and suicidal ideas. The patient has insomnia. The patient is not nervous/anxious.   All other systems reviewed and are negative.    EKGs/Labs/Other Studies Reviewed:    Studies reviewed were summarized above. The additional studies were reviewed today:  2D echo 06/06/2020: 1. Left ventricular ejection fraction, by estimation, is 60 to 65%. The  left ventricle has normal function. The left ventricle has no regional  wall motion abnormalities. There is mild left ventricular hypertrophy.  Left ventricular diastolic parameters  are indeterminate.  2. Right ventricular systolic function is normal. The right ventricular  size is normal. There is normal pulmonary artery systolic pressure.  3. Left atrial size was moderately dilated.  4. The aortic valve was not well  visualized. Aortic valve regurgitation  is not visualized. No aortic stenosis is present.  EKG:  EKG is ordered today.  The EKG ordered today demonstrates atrial flutter with variable AV block, 85 bpm, incomplete RBBB (previously known), left compared to prior tracing atrial flutter has replaced sinus rhythm  Recent  Labs: 06/05/2020: ALT 28; TSH 0.034 06/06/2020: Hemoglobin 12.6; Platelets 97 06/23/2020: BUN 23; Creatinine, Ser 1.27; Potassium 4.6; Sodium 140  Recent Lipid Panel    Component Value Date/Time   CHOL 134 05/15/2020 1023   CHOL 93 04/03/2013 0209   TRIG 233 (H) 05/15/2020 1023   TRIG 163 04/03/2013 0209   HDL 29 (L) 05/15/2020 1023   HDL 25 (L) 04/03/2013 0209   CHOLHDL 4.6 05/15/2020 1023   CHOLHDL 6.6 09/09/2015 0501   VLDL UNABLE TO CALCULATE IF TRIGLYCERIDE OVER 400 mg/dL 09/09/2015 0501   VLDL 33 04/03/2013 0209   LDLCALC 67 05/15/2020 1023   LDLCALC 35 04/03/2013 0209    PHYSICAL EXAM:    VS:  BP 120/60 (BP Location: Left Arm, Patient Position: Sitting, Cuff Size: Large)   Pulse 85   Ht 5\' 10"  (1.778 m)   Wt 258 lb 4 oz (117.1 kg)   SpO2 98%   BMI 37.06 kg/m   BMI: Body mass index is 37.06 kg/m.  Physical Exam Constitutional:      Appearance: He is well-developed.  HENT:     Head: Normocephalic and atraumatic.  Eyes:     General:        Right eye: No discharge.        Left eye: No discharge.  Neck:     Vascular: No JVD.  Cardiovascular:     Rate and Rhythm: Normal rate. Rhythm irregular.     Pulses: No midsystolic click and no opening snap.          Posterior tibial pulses are 2+ on the right side and 2+ on the left side.     Heart sounds: Normal heart sounds, S1 normal and S2 normal. Heart sounds not distant. No murmur heard.  No friction rub.  Pulmonary:     Effort: Pulmonary effort is normal. No respiratory distress.     Breath sounds: Normal breath sounds. No decreased breath sounds, wheezing or rales.  Chest:     Chest wall: No tenderness.  Abdominal:     General: There is no distension.     Palpations: Abdomen is soft.     Tenderness: There is no abdominal tenderness.  Musculoskeletal:     Cervical back: Normal range of motion.     Left lower leg: Edema present.     Comments: Trace bilateral pretibial edema with changes consistent with chronic  venous insufficiency  Skin:    General: Skin is warm and dry.     Nails: There is no clubbing.  Neurological:     Mental Status: He is alert and oriented to person, place, and time.  Psychiatric:        Speech: Speech normal.        Behavior: Behavior normal.        Thought Content: Thought content normal.        Judgment: Judgment normal.     Wt Readings from Last 3 Encounters:  07/02/20 258 lb 4 oz (117.1 kg)  06/11/20 271 lb (122.9 kg)  06/05/20 263 lb (119.3 kg)     ASSESSMENT & PLAN:   1. A. Fib/flutter: Previously diagnosed with atrial flutter in 2014, with successful  cardioversion at that time.  Given his CHA2DS2-VASc at that time he was not continued on anticoagulation.  More recently, he was incidentally noted to be in rate controlled atrial flutter with variable AV block during his recent admission last month.  He remains in rate-controlled atrial flutter with variable AV block and is asymptomatic.  This is quite possibly in the setting of his abnormal thyroid function with parathyroid adenoma.  Continue metoprolol for rate control.  Given a CHA2DS2-VASc of at least four he remains on Eliquis.  Check BMP and CBC.  Given that he is asymptomatic in this rhythm, and in the setting of his underlying parathyroid adenoma with plans for surgical resection in the near future (in an effort to not delay his surgery as this would commit him to an additional 4 weeks of uninterrupted anticoagulation) we will defer DCCV at this time until after his surgery if he does not spontaneously convert to sinus rhythm following adequate treatment of his underlying endocrine disorder.  2. Question of LOC vs somnolence: Patient's wife is quite certain the patient was just sleeping as she has had quite a difficult time waking him from sleep in the past.  Leading up to these events he was not sleeping well as he was quite anxious regarding his surgical appointment.  I do not think this was a malignant  arrhythmia given prolonged duration of approximately 2 hours.  I also do not think this episode can be attributed to his underlying atrial flutter as this rhythm by itself does not typically lead to syncope.  Cannot completely exclude posttermination pause however, this is less likely given he presented in atrial flutter and not sinus rhythm.  Outside of atrial flutter noted during his admission no significant arrhythmia or prolonged pauses was noted.  Echo demonstrated preserved LV systolic function.  High-sensitivity troponin negative x2.  No symptoms concerning for angina.  Plan to proceed with outpatient cardiac monitoring.  Consider narcotic use playing a role versus need for repeat sleep study and titration of his CPAP.  Patient has Narcan prescribed.  He denies any symptoms concerning for depression or suicidal ideation.  3. Preoperative cardiac evaluation: He is doing well without any symptoms concerning for angina or decompensated heart failure. Revised Cardiac Index: Low risk for noncardiac surgery. [facility] Activity Status Index: > 4 METs without cardiac limitation.  It remains unclear as to if his presentation the hospital was true syncope or not as outlined above.  Nonetheless, he is low risk for noncardiac surgery and may proceed without further cardiac testing given recent echo demonstrating preserved LV systolic function.  Recommend holding Eliquis 2 days prior to surgery with recommendation to resume as soon as safely possible per surgical team.  Maintain potassium and magnesium at goal 4.0 and 2.0 respectively.  Hopefully, following treatment of his endocrine disorder his atrial arrhythmia will spontaneously correct itself.  If not, we can plan to pursue DCCV thereafter following adequate anticoagulation.  4. HFpEF: He appears euvolemic and well compensated.  Remains on furosemide.  Optimal blood pressure and heart rate control recommended.  He is at high risk of volume overload while in his atrial  arrhythmia and perioperative fluids should be minimized as much as possible.  5. HTN: Blood pressure is well controlled in the office today. Continue current therapy.   6. HLD: LDL 67 from 04/2020, not on statin therapy.  7. History of DVT/PE: Status post prior treatment with warfarin.  Now on Eliquis given recurrence of atrial flutter.  8. CKD stage III: Check BMP.  9. OSA: Continue CPAP.  Disposition: F/u with Dr. Rockey Situ or an APP in 2 months.   Medication Adjustments/Labs and Tests Ordered: Current medicines are reviewed at length with the patient today.  Concerns regarding medicines are outlined above. Medication changes, Labs and Tests ordered today are summarized above and listed in the Patient Instructions accessible in Encounters.   Signed, Christell Faith, PA-C 07/02/2020 12:09 PM     [address] [place], [place] [id number] [phone]

## 2020-07-01 ENCOUNTER — Other Ambulatory Visit: Payer: Self-pay | Admitting: Family Medicine

## 2020-07-01 ENCOUNTER — Telehealth: Payer: Self-pay | Admitting: General Surgery

## 2020-07-01 NOTE — Telephone Encounter (Signed)
Requested medication (s) are due for refill today: yes  Requested medication (s) are on the active medication list: yes  Last refill: 03/27/2020  Future visit scheduled: yes  Notes to clinic:  this refill cannot be delegated    Requested Prescriptions  Pending Prescriptions Disp Refills   pregabalin (LYRICA) 200 MG capsule [Pharmacy Med Name: PREGABALIN CAP 200MG ] 270 capsule 1    Sig: TAKE 1 CAPSULE 3 TIMES A   DAY      Not Delegated - Neurology:  Anticonvulsants - Controlled Failed - 07/01/2020  6:16 AM      Failed - This refill cannot be delegated      Passed - Valid encounter within last 12 months    Recent Outpatient Visits           2 weeks ago Typical atrial flutter [facility])   [facility] Jerrol Banana., MD   1 month ago Hypercalcemia   [facility] Jerrol Banana., MD   1 month ago Diabetes mellitus type 2, uncontrolled, with complications [facility])   [facility] Jerrol Banana., MD   3 months ago RLS (restless legs syndrome)   [facility] Jerrol Banana., MD   4 months ago Diabetes mellitus type 2, uncontrolled, with complications [facility])   [facility] Jerrol Banana., MD       Future Appointments             Tomorrow Dunn, Areta Haber, PA-C [facility], [place]   In 1 month Newland, Kathlene November, MD [facility], [place]

## 2020-07-01 NOTE — Telephone Encounter (Signed)
Dr. [name] out of office. Refilled x 1. PMP verified.

## 2020-07-01 NOTE — Telephone Encounter (Signed)
Patient should have just picked up refill from pharmacy as original was prescribed 03/27/20 with three month supply and additional three months lasting until around October 2021- verify if he is out and if taking as directed ? Thanks - may just be automated request.   Reviewed PMP aware as below:  03/27/2020 Pregabalin 200 MG Capsule 270.00 90 [facility] 1 361.80

## 2020-07-01 NOTE — Telephone Encounter (Signed)
Called patient and discussed the results of his recent lab work.  I confirmed that he had been off hydrochlorothiazide for 2 weeks prior to having the labs done.  They are consistent with primary hyperparathyroidism.  Based upon his decreased GFR, I recommended that he undergo parathyroidectomy.  Due to his other health concerns, he would like to discuss this further with his family and they will contact my office with her decision.

## 2020-07-01 NOTE — Telephone Encounter (Signed)
03/27/2020 Pregabalin 200 MG Capsule 270.00 90 Ric [facility] 1 361.80

## 2020-07-01 NOTE — Telephone Encounter (Signed)
Please review request. KW 

## 2020-07-02 ENCOUNTER — Encounter: Payer: Self-pay | Admitting: Physician Assistant

## 2020-07-02 ENCOUNTER — Ambulatory Visit (INDEPENDENT_AMBULATORY_CARE_PROVIDER_SITE_OTHER): Payer: Medicare Other | Admitting: Physician Assistant

## 2020-07-02 ENCOUNTER — Other Ambulatory Visit: Payer: Self-pay

## 2020-07-02 ENCOUNTER — Ambulatory Visit (INDEPENDENT_AMBULATORY_CARE_PROVIDER_SITE_OTHER): Payer: Medicare Other

## 2020-07-02 VITALS — BP 120/60 | HR 85 | Ht 70.0 in | Wt 258.2 lb

## 2020-07-02 DIAGNOSIS — I483 Typical atrial flutter: Secondary | ICD-10-CM | POA: Diagnosis not present

## 2020-07-02 DIAGNOSIS — R400 Somnolence: Secondary | ICD-10-CM

## 2020-07-02 DIAGNOSIS — I5032 Chronic diastolic (congestive) heart failure: Secondary | ICD-10-CM

## 2020-07-02 DIAGNOSIS — Z86711 Personal history of pulmonary embolism: Secondary | ICD-10-CM

## 2020-07-02 DIAGNOSIS — Z01810 Encounter for preprocedural cardiovascular examination: Secondary | ICD-10-CM

## 2020-07-02 DIAGNOSIS — E782 Mixed hyperlipidemia: Secondary | ICD-10-CM

## 2020-07-02 DIAGNOSIS — I10 Essential (primary) hypertension: Secondary | ICD-10-CM

## 2020-07-02 DIAGNOSIS — G4733 Obstructive sleep apnea (adult) (pediatric): Secondary | ICD-10-CM

## 2020-07-02 DIAGNOSIS — Z9989 Dependence on other enabling machines and devices: Secondary | ICD-10-CM

## 2020-07-02 DIAGNOSIS — N183 Chronic kidney disease, stage 3 unspecified: Secondary | ICD-10-CM

## 2020-07-02 NOTE — Patient Instructions (Signed)
Medication Instructions:  Your physician recommends that you continue on your current medications as directed. Please refer to the Current Medication list given to you today.  *If you need a refill on your cardiac medications before your next appointment, please call your pharmacy*   Lab Work: Bmet and Cbc today If you have labs (blood work) drawn today and your tests are completely normal, you will receive your results only by: [name] MyChart Message (if you have MyChart) OR . A paper copy in the mail If you have any lab test that is abnormal or we need to change your treatment, we will call you to review the results.   Testing/Procedures: (To be worn for 14 days) Your physician has recommended that you wear a Zio monitor. This monitor is a medical device that records the heart's electrical activity. Doctors most often use these monitors to diagnose arrhythmias. Arrhythmias are problems with the speed or rhythm of the heartbeat. The monitor is a small device applied to your chest. You can wear one while you do your normal daily activities. While wearing this monitor if you have any symptoms to push the button and record what you felt. Once you have worn this monitor for the period of time provider prescribed (Usually 14 days), you will return the monitor device in the postage paid box. Once it is returned they will download the data collected and provide [place] with a report which the provider will then review and we will call you with those results. Important tips:  1. Avoid showering during the first 24 hours of wearing the monitor. 2. Avoid excessive sweating to help maximize wear time. 3. Do not submerge the device, no hot tubs, and no swimming pools. 4. Keep any lotions or oils away from the patch. 5. After 24 hours you may shower with the patch on. Take brief showers with your back facing the shower head.  6. Do not remove patch once it has been placed because that will interrupt data and  decrease adhesive wear time. 7. Push the button when you have any symptoms and write down what you were feeling. 8. Once you have completed wearing your monitor, remove and place into box which has postage paid and place in your outgoing mailbox.  9. If for some reason you have misplaced your box then call our office and we can provide another box and/or mail it off for you.         Follow-Up: At [facility], you and your health needs are our priority.  As part of our continuing mission to provide you with exceptional heart care, we have created designated Provider Care Teams.  These Care Teams include your primary Cardiologist (physician) and Advanced Practice Providers (APPs -  Physician Assistants and Nurse Practitioners) who all work together to provide you with the care you need, when you need it.  We recommend signing up for the patient portal called "MyChart".  Sign up information is provided on this After Visit Summary.  MyChart is used to connect with patients for Virtual Visits (Telemedicine).  Patients are able to view lab/test results, encounter notes, upcoming appointments, etc.  Non-urgent messages can be sent to your provider as well.   To learn more about what you can do with MyChart, go to [email or web address].    Your next appointment:   2 month(s)  The format for your next appointment:   In Person  Provider:    You may see Ida Rogue, MD or one  of the following Advanced Practice Providers on your designated Care Team:    Murray Hodgkins, NP  Christell Faith, PA-C  Marrianne Mood, PA-C    Other Instructions N/A

## 2020-07-03 LAB — CBC WITH DIFFERENTIAL/PLATELET
Basophils Absolute: 0 10*3/uL (ref 0.0–0.2)
Basos: 0 %
EOS (ABSOLUTE): 0.2 10*3/uL (ref 0.0–0.4)
Eos: 3 %
Hematocrit: 45.5 % (ref 37.5–51.0)
Hemoglobin: 14 g/dL (ref 13.0–17.7)
Immature Grans (Abs): 0 10*3/uL (ref 0.0–0.1)
Immature Granulocytes: 0 %
Lymphocytes Absolute: 2 10*3/uL (ref 0.7–3.1)
Lymphs: 27 %
MCH: 24.4 pg — ABNORMAL LOW (ref 26.6–33.0)
MCHC: 30.8 g/dL — ABNORMAL LOW (ref 31.5–35.7)
MCV: 79 fL (ref 79–97)
Monocytes Absolute: 0.7 10*3/uL (ref 0.1–0.9)
Monocytes: 9 %
Neutrophils Absolute: 4.5 10*3/uL (ref 1.4–7.0)
Neutrophils: 61 %
Platelets: 120 10*3/uL — ABNORMAL LOW (ref 150–450)
RBC: 5.73 x10E6/uL (ref 4.14–5.80)
RDW: 16 % — ABNORMAL HIGH (ref 11.6–15.4)
WBC: 7.4 10*3/uL (ref 3.4–10.8)

## 2020-07-03 LAB — BASIC METABOLIC PANEL
BUN/Creatinine Ratio: 21 (ref 10–24)
BUN: 27 mg/dL (ref 8–27)
CO2: 29 mmol/L (ref 20–29)
Calcium: 11.9 mg/dL — ABNORMAL HIGH (ref 8.6–10.2)
Chloride: 102 mmol/L (ref 96–106)
Creatinine, Ser: 1.3 mg/dL — ABNORMAL HIGH (ref 0.76–1.27)
GFR calc Af Amer: 64 mL/min/{1.73_m2} (ref >59)
GFR calc non Af Amer: 55 mL/min/{1.73_m2} — ABNORMAL LOW (ref >59)
Glucose: 154 mg/dL — ABNORMAL HIGH (ref 65–99)
Potassium: 4.8 mmol/L (ref 3.5–5.2)
Sodium: 143 mmol/L (ref 134–144)

## 2020-07-07 ENCOUNTER — Telehealth: Payer: Self-pay | Admitting: *Deleted

## 2020-07-07 ENCOUNTER — Telehealth: Payer: Self-pay | Admitting: General Surgery

## 2020-07-07 NOTE — Telephone Encounter (Signed)
Spoke with patient's wife to notify her that Dr.Cannon is out of the office until the week of the 19th. Notified patient's wife that patient would need a pre-operative appointment before moving forward with surgery since it has been over 30 days since patient's last office visit. Patient is schedule with Dr.Cannon on 07/17/20 at 11:15am. Patient's wife verbalized understanding and has no further questions.

## 2020-07-07 NOTE — Telephone Encounter (Signed)
Patient and wife decided that they would like to proceed with surgery.  They are aware that Dr. Celine Ahr is out of the office for another week or so.  Need to confirm with Dr. Celine Ahr if ok to proceed with surgery, if so need booking sheet.  Also will patient need to come in for follow up or any further testing prior to surgery?  Please advise.  Thank you.

## 2020-07-07 NOTE — Telephone Encounter (Signed)
Patients wife called and stated that they spoke with Dr Celine Ahr last week and is now ready to proceed with surgery. Please call and advise

## 2020-07-13 ENCOUNTER — Other Ambulatory Visit: Payer: Self-pay | Admitting: Family Medicine

## 2020-07-13 NOTE — Telephone Encounter (Signed)
Requested Prescriptions  Pending Prescriptions Disp Refills  . dutasteride (AVODART) 0.5 MG capsule [Pharmacy Med Name: DUTASTERIDE  CAP 0.5MG ] 90 capsule 3    Sig: TAKE 1 CAPSULE DAILY     Urology: 5-alpha Reductase Inhibitors Passed - 07/13/2020  2:17 PM      Passed - Valid encounter within last 12 months    Recent Outpatient Visits          1 month ago Typical atrial flutter [facility])   [facility] Jerrol Banana., MD   1 month ago Hypercalcemia   [facility] Jerrol Banana., MD   2 months ago Diabetes mellitus type 2, uncontrolled, with complications [facility])   [facility] Jerrol Banana., MD   4 months ago RLS (restless legs syndrome)   [facility] Jerrol Banana., MD   5 months ago Diabetes mellitus type 2, uncontrolled, with complications [facility])   [facility] Jerrol Banana., MD      Future Appointments            In 1 month Gollan, Kathlene November, MD [facility], [facility]

## 2020-07-17 ENCOUNTER — Other Ambulatory Visit: Payer: Self-pay | Admitting: Family Medicine

## 2020-07-17 ENCOUNTER — Encounter: Payer: Self-pay | Admitting: General Surgery

## 2020-07-17 ENCOUNTER — Telehealth: Payer: Self-pay | Admitting: Cardiovascular Disease

## 2020-07-17 ENCOUNTER — Telehealth: Payer: Self-pay

## 2020-07-17 ENCOUNTER — Ambulatory Visit (INDEPENDENT_AMBULATORY_CARE_PROVIDER_SITE_OTHER): Payer: Medicare Other | Admitting: General Surgery

## 2020-07-17 ENCOUNTER — Other Ambulatory Visit: Payer: Self-pay

## 2020-07-17 VITALS — BP 161/106 | HR 76 | Temp 98.6°F | Resp 12 | Ht 71.0 in | Wt 263.0 lb

## 2020-07-17 DIAGNOSIS — E210 Primary hyperparathyroidism: Secondary | ICD-10-CM | POA: Diagnosis not present

## 2020-07-17 DIAGNOSIS — E213 Hyperparathyroidism, unspecified: Secondary | ICD-10-CM

## 2020-07-17 NOTE — Progress Notes (Signed)
Patient ID: Jerry Parsons, male   DOB: 1951/07/14, 69 y.o.   MRN: [id number]  Chief Complaint  Patient presents with  . Follow-up    update H&P discuss parathyroid surgery    HPI Jerry Parsons is a 69 y.o. male.   I initially saw him in June of this year.  My HPI from that visit is copied here:   "He has been referred by his primary care provider, Dr. Miguel Aschoff, MD, for further evaluation of hypercalcemia.  Mr. Millikan states that he was found to have elevated calcium levels on routine blood work.  This was repeated, in conjunction with an intact PTH level.  The calcium was elevated and the PTH was inappropriately normal.  Serum protein electrophoresis with also performed which was negative for any monoclonal abnormalities.  His vitamin D level was normal.  He is here for evaluation of possible hyperparathyroidism.  Mr. Humble reports that he has had one episode of nephrolithiasis.  He has chronic constipation.  He has never had pancreatitis.  He does endorse both bone and joint pain.  He has sleep disturbances, specifically difficulty staying asleep at night.  He endorses polydipsia as well as ice pica.  He experiences approximately 1 episode of nocturia on a regular basis.  He endorses foggy thinking, fatigue, and memory concerns.  He states that his father had trouble with kidney stones.  He denies any family history of parathyroid disease, hypercalcemia, jaw tumors, pituitary, pancreatic, or adrenal tumors.  He is not certain whether he experienced any occupational exposure to ionizing radiation, but denies any therapeutic head or neck irradiation.  Of note, he does take hydrochlorothiazide on a regular basis.  Labs demonstrating hypercalcemia were obtained while using hydrochlorothiazide.  He occasionally will use Tums for indigestion, but does not take them regularly."  At that visit, I performed an ultrasound, but was unable to identify a parathyroid adenoma secondary to poor  windows.  He had repeat labs performed off of hydrochlorothiazide that showed persistent elevation in his calcium as well as an elevated 24-hour urinary calcium.  The biochemical diagnosis of primary hyperparathyroidism was confirmed.  He is here today, accompanied by his wife, to discuss surgical intervention.  Since our last visit, he was briefly admitted to the hospital with a possible syncopal episode versus profound somnolence. He has since been seen in Cardiology.  He is on Eliquis for atrial flutter identified on EKG during his hospital stay.  The cardiology note indicates that he is low-risk for non-cardiac surgery and that he should stop Eliquis for 2 days prior to surgery and resume as soon as it is safe from a surgical perspective.   Past Medical History:  Diagnosis Date  . Arrhythmia    tachycardia, A-Fib  . Arthritis   . Blind right eye   . BPH (benign prostatic hyperplasia)   . Diabetes mellitus   . DVT (deep venous thrombosis) ([place]) 02/2010   leg thrombus ; dislodged into emboli and caused PE  . Dyspnea   . Dysrhythmia   . 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 GERD (gastroesophageal reflux disease)   . Hypercholesteremia   . Hypertension   . Kidney failure   . Neuromuscular disorder ([place])   . Neuropathy   . Pneumonia    time 9 ;last episode 12/2015  . Pulmonary embolus ([place]) 2011  . Seasonal allergies   . Seizures ([place])    as child   . Sleep apnea  BIPAP  . Stiff neck    limited turning s/p titanium plate placement  . TIA (transient ischemic attack)   . Wears dentures    full upper and lower    Past Surgical History:  Procedure Laterality Date  . APPENDECTOMY    . BACK SURGERY     x 8; upper x 3 & lower x 5  . CARDIOVERSION  03/14/13, 10/16   2014 - [place], 2016 - [place]  . CATARACT EXTRACTION W/PHACO Left 10/29/2015   Procedure: CATARACT EXTRACTION PHACO AND INTRAOCULAR LENS PLACEMENT (IOC);  Surgeon: Leandrew Koyanagi, MD;  Location: [facility];  Service: Ophthalmology;  Laterality: Left;  DIABETIC - insulin and oral medsSleep apnea - no machine  . CHOLECYSTECTOMY    . COLONOSCOPY WITH PROPOFOL N/A 01/16/2018   Procedure: COLONOSCOPY WITH PROPOFOL;  Surgeon: Manya Silvas, MD;  Location: [facility] ENDOSCOPY;  Service: Endoscopy;  Laterality: N/A;  . ESOPHAGOGASTRODUODENOSCOPY (EGD) WITH PROPOFOL N/A 01/16/2018   Procedure: ESOPHAGOGASTRODUODENOSCOPY (EGD) WITH PROPOFOL;  Surgeon: Manya Silvas, MD;  Location: [facility] ENDOSCOPY;  Service: Endoscopy;  Laterality: N/A;  . EYE SURGERY    . GALLBLADDER SURGERY  2002  . JOINT REPLACEMENT Right 2018  . KNEE ARTHROSCOPY     left   . ROTATOR CUFF REPAIR  2001   left   . TEE WITHOUT CARDIOVERSION N/A 04/26/2018   Procedure: TRANSESOPHAGEAL ECHOCARDIOGRAM (TEE);  Surgeon: Nelva Bush, MD;  Location: [facility] ORS;  Service: Cardiovascular;  Laterality: N/A;  . TONSILLECTOMY    . TOTAL KNEE ARTHROPLASTY Right 06/20/2017   Procedure: RIGHT TOTAL KNEE ARTHROPLASTY;  Surgeon: Gaynelle Arabian, MD;  Location: [facility] ORS;  Service: Orthopedics;  Laterality: Right;    Family History  Problem Relation Age of Onset  . Heart attack Brother     Social History Social History   Tobacco Use  . Smoking status: Former Smoker    Packs/day: 2.00    Years: 20.00    Pack years: 40.00    Types: Cigarettes    Quit date: 12/26/1989    Years since quitting: 30.5  . Smokeless tobacco: Never Used  Vaping Use  . Vaping Use: Never used  Substance Use Topics  . Alcohol use: No  . Drug use: No    Allergies  Allergen Reactions  . Carisoprodol Itching  . Tamsulosin     Pt stated, "upsets my stomach"    Current Outpatient Medications  Medication Sig Dispense Refill  . amiodarone (PACERONE) 200 MG tablet TAKE 1 TABLET BY MOUTH EVERY DAY (Patient taking differently: Take 200 mg by mouth daily. ) 90 tablet 0  . apixaban (ELIQUIS) 5 MG TABS tablet Take 1 tablet (5 mg total) by mouth 2 (two) times  daily. 60 tablet 2  . cholecalciferol (VITAMIN D3) 25 MCG (1000 UT) tablet Take 1,000 Units by mouth daily.    . cyclobenzaprine (FLEXERIL) 10 MG tablet TAKE 1 TABLET BY MOUTH 3 TIMES A DAY (Patient taking differently: Take 10 mg by mouth 3 (three) times daily. ) 90 tablet 12  . docusate sodium (COLACE) 250 MG capsule Take 250 mg by mouth daily as needed.     . dutasteride (AVODART) 0.5 MG capsule TAKE 1 CAPSULE DAILY 90 capsule 3  . esomeprazole (NEXIUM) 40 MG capsule Take 1 capsule (40 mg total) by mouth 2 (two) times daily. 90 capsule 3  . fexofenadine (ALLEGRA) 180 MG tablet Take 180 mg by mouth daily.      . furosemide (LASIX) 40 MG  tablet Take 40 mg by mouth daily.    [name] gabapentin (NEURONTIN) 100 MG capsule TAKE 1 CAPSULE BY MOUTH TWICE A DAY 180 capsule 2  . hydrochlorothiazide (HYDRODIURIL) 12.5 MG tablet TAKE 2 TABLETS (25 MG TOTAL) BY MOUTH AT BEDTIME. 180 tablet 1  . Insulin Pen Needle (PEN NEEDLES) 31G X 5 MM MISC 1 each by Does not apply route daily. PATIENT NEEDS NOVA TWIST NEEDLES 5 MM  DX E11.9 200 each 3  . JANUVIA 100 MG tablet TAKE 1 TABLET DAILY (Patient taking differently: Take 100 mg by mouth daily. ) 90 tablet 3  . LANTUS SOLOSTAR 100 UNIT/ML Solostar Pen Inject 38 Units into the skin 2 (two) times daily.    [name] liraglutide (VICTOZA) 18 MG/3ML SOPN INJECT 0.3ML (=1.8MG )      SUBCUTANEOUSLY DAILY 27 mL 3  . magnesium oxide (MAG-OX) 400 MG tablet TAKE 1 TABLET (400 MG TOTAL) BY MOUTH 2 (TWO) TIMES DAILY. (Patient taking differently: Take 400 mg by mouth 2 (two) times daily. ) 180 tablet 3  . Melatonin Gummies 2.5 MG CHEW Chew by mouth as needed.    . metoprolol tartrate (LOPRESSOR) 100 MG tablet TAKE 1 TABLET (100 MG TOTAL) BY MOUTH 2 (TWO) TIMES DAILY. 60 tablet 11  . naloxone (NARCAN) 0.4 MG/ML injection INJECT AS NEEDED FOR SUSPECTED OVERDOSE.    . Naloxone HCl 0.4 MG/0.4ML SOAJ Inject as needed for suspected overdose. 0.4 mL 2  . NOVOLOG FLEXPEN 100 UNIT/ML FlexPen INJECT 12  UNITS            SUBCUTANEOUSLY 3 TIMES A   DAY WITH MEALS (Patient taking differently: 12 Units in the morning and at bedtime. ) 15 mL 5  . NOVOTWIST 32G X 5 MM MISC USE 6 TIMES A DAY DX E11.9 100 each 9  . Omega-3 Fatty Acids (FISH OIL) 1200 MG CAPS Take 1,200 mg by mouth 2 (two) times daily.     Glory Rosebush ULTRA test strip USE WITH METER TWICE DAILY 100 strip 10  . oxyCODONE (OXYCONTIN) 40 mg 12 hr tablet Take 1 tablet (40 mg total) by mouth every 8 (eight) hours as needed. 270 tablet 0  . pregabalin (LYRICA) 200 MG capsule TAKE 1 CAPSULE 3 TIMES A   DAY (Patient taking differently: Take 200 mg by mouth 3 (three) times daily. ) 270 capsule 1  . rOPINIRole (REQUIP) 1 MG tablet TAKE 1 TO 2 TABLETS BY MOUTH AT BEDTIME AS NEEDED (Patient taking differently: Take 1 mg by mouth in the morning. ) 60 tablet 1  . rOPINIRole (REQUIP) 2 MG tablet Take 1 tablet twice a day as needed. (Patient taking differently: Take 2 mg by mouth at bedtime. ) 60 tablet 5  . sucralfate (CARAFATE) 1 g tablet TAKE 1 TABLET BY MOUTH 4 (FOUR) TIMES DAILY - WITH MEALS AND AT BEDTIME. (Patient taking differently: Take 1 g by mouth 4 (four) times daily. ) 360 tablet 1  . testosterone cypionate (DEPOTESTOSTERONE CYPIONATE) 200 MG/ML injection Inject 200 mg into the muscle every 21 ( twenty-one) days.      No current facility-administered medications for this visit.    Review of Systems Review of Systems  All other systems reviewed and are negative. or as discussed in the HPI  Blood pressure (!) 161/106, pulse 76, temperature 98.6 F (37 C), temperature source Oral, resp. rate 12, height 5\' 11"  (1.803 m), weight (!) 263 lb (119.3 kg), SpO2 93 %. Body mass index is 36.68 kg/m.  Physical  Exam Physical Exam Constitutional:      General: He is not in acute distress.    Appearance: He is obese.  HENT:     Head: Normocephalic and atraumatic.     Nose:     Comments: Covered with a mask    Mouth/Throat:     Comments:  Covered with a mask Eyes:     General: No scleral icterus.       Right eye: No discharge.        Left eye: No discharge.  Neck:     Comments: Vertical scar on left neck from 2 prior ACDF procedures. ROM limited.  No palpable masses. Cardiovascular:     Rate and Rhythm: Normal rate.  Pulmonary:     Effort: Pulmonary effort is normal. No respiratory distress.  Abdominal:     General: Bowel sounds are normal.     Palpations: Abdomen is soft.  Genitourinary:    Comments: deferred Musculoskeletal:        General: No swelling.     Right lower leg: No edema.     Left lower leg: No edema.     Comments: Venous stasis skin changes  Skin:    General: Skin is warm and dry.  Neurological:     General: No focal deficit present.     Mental Status: He is alert.  Psychiatric:        Mood and Affect: Mood normal.        Behavior: Behavior normal.     Data Reviewed Results for AB, LEAMING (MRN [id number]) as of 07/17/2020 13:32  Ref. Range 06/24/2020 13:14  Calcium, Ur Latest Ref Range: Not Estab. mg/dL 17.4  Calcium, 24 hour urine Latest Ref Range: 47 - 462 mg/24 hr 383 (H)  Total Volume Unknown 2,200  Creatinine, Urine Latest Units: mg/dL 73  Urine Total Volume-UCRE24 Latest Units: mL 2,200  Collection Interval-UCRE24 Latest Units: hours 24  Creatinine, 24H Ur Latest Ref Range: 800 - 2,000 mg/day 1,606  Results for RYVER, POBLETE (MRN [id number]) as of 07/17/2020 13:32  Ref. Range 06/23/2020 60:10  BASIC METABOLIC PANEL Unknown Rpt (A)  Sodium Latest Ref Range: 135 - 145 mmol/L 140  Potassium Latest Ref Range: 3.5 - 5.1 mmol/L 4.6  Chloride Latest Ref Range: 98 - 111 mmol/L 101  CO2 Latest Ref Range: 22 - 32 mmol/L 31  Glucose Latest Ref Range: 70 - 99 mg/dL 171 (H)  BUN Latest Ref Range: 8 - 23 mg/dL 23  Creatinine Latest Ref Range: 0.61 - 1.24 mg/dL 1.27 (H)  Calcium Latest Ref Range: 8.9 - 10.3 mg/dL 11.1 (H)  Anion gap Latest Ref Range: 5 - 15  8  GFR, Est Non African American  Latest Ref Range: >60 mL/min 57 (L)  GFR, Est African American Latest Ref Range: >60 mL/min >60  Results for GAL, FELDHAUS (MRN [id number]) as of 07/17/2020 13:32  Ref. Range 05/19/2020 14:34 05/29/2020 09:56  PTH, Intact Latest Ref Range: 15 - 65 pg/mL 53 41  PTH Interp Unknown Comment Comment    These labs show elevated calcium, even off of HCTZ and an inappropriately normal PTH value.  He also has elevated UCa and decreased GFR.  I reviewed the cardiology note from 07/02/20, as summarized above.  Assessment This is a 69 year old man with hypercalcemia and biochemical evidence of primary hyperparathyroidism.  His hypercalcemia has persisted off of hydrochlorothiazide and he does have a decreased GFR.  Surgery is indicated.  Plan The risks of  parathyroid surgery were discussed with the patient, including (but not limited to): bleeding, infection, damage to surrounding structures/tissues, injury (temporary or permanent) to the recurrent laryngeal nerve, hypocalcemia (temporary or permanent), need to take calcium and/or vitamin D supplementation, recurrent hyperparathyroidism, failure to correct hyperparathyroidism, need for additional surgery.  The patient and his wife had the opportunity to ask any questions and these were answered to their satisfaction.  We will get a NM parathyroid scan for pre-operative localization, get formal cardiology clearance, and plan for an overnight observation post-op, due to his multiple medical comorbid condition.    Fredirick Maudlin 07/17/2020, 1:20 PM

## 2020-07-17 NOTE — Telephone Encounter (Signed)
   [facility] Pre-operative Risk Assessment    HEARTCARE STAFF: - Please ensure there is not already an duplicate clearance open for this procedure. - Under Visit Info/Reason for Call, type in Other and utilize the format Clearance MM/DD/YY or Clearance TBD. Do not use dashes or single digits. - If request is for dental extraction, please clarify the # of teeth to be extracted.  Request for surgical clearance:  1. What type of surgery is being performed? parathyroidectomy   2. When is this surgery scheduled? 07/25/20  3. What type of clearance is required (medical clearance vs. Pharmacy clearance to hold med vs. Both)? both  4. Are there any medications that need to be held prior to surgery and how long? Eliquis  5. Practice name and name of physician performing surgery?  [facility]hr  6. What is the office phone number? [phone]   7.   What is the office fax number? [phone]  8.   Anesthesia type (None, local, MAC, general) ? general   Marykay Lex 07/17/2020, 3:19 PM  _________________________________________________________________   (provider comments below)

## 2020-07-17 NOTE — H&P (View-Only) (Signed)
Patient ID: Jerry Parsons, male   DOB: 1951/02/28, 69 y.o.   MRN: [id number]  Chief Complaint  Patient presents with  . Follow-up    update H&P discuss parathyroid surgery    HPI Jerry Parsons is a 69 y.o. male.   I initially saw him in June of this year.  My HPI from that visit is copied here:   "He has been referred by his primary care provider, Dr. Miguel Aschoff, MD, for further evaluation of hypercalcemia.  [name] states that he was found to have elevated calcium levels on routine blood work.  This was repeated, in conjunction with an intact PTH level.  The calcium was elevated and the PTH was inappropriately normal.  Serum protein electrophoresis with also performed which was negative for any monoclonal abnormalities.  His vitamin D level was normal.  He is here for evaluation of possible hyperparathyroidism.  [name] reports that he has had one episode of nephrolithiasis.  He has chronic constipation.  He has never had pancreatitis.  He does endorse both bone and joint pain.  He has sleep disturbances, specifically difficulty staying asleep at night.  He endorses polydipsia as well as ice pica.  He experiences approximately 1 episode of nocturia on a regular basis.  He endorses foggy thinking, fatigue, and memory concerns.  He states that his father had trouble with kidney stones.  He denies any family history of parathyroid disease, hypercalcemia, jaw tumors, pituitary, pancreatic, or adrenal tumors.  He is not certain whether he experienced any occupational exposure to ionizing radiation, but denies any therapeutic head or neck irradiation.  Of note, he does take hydrochlorothiazide on a regular basis.  Labs demonstrating hypercalcemia were obtained while using hydrochlorothiazide.  He occasionally will use Tums for indigestion, but does not take them regularly."  At that visit, I performed an ultrasound, but was unable to identify a parathyroid adenoma secondary to poor  windows.  He had repeat labs performed off of hydrochlorothiazide that showed persistent elevation in his calcium as well as an elevated 24-hour urinary calcium.  The biochemical diagnosis of primary hyperparathyroidism was confirmed.  He is here today, accompanied by his wife, to discuss surgical intervention.  Since our last visit, he was briefly admitted to the hospital with a possible syncopal episode versus profound somnolence. He has since been seen in Cardiology.  He is on Eliquis for atrial flutter identified on EKG during his hospital stay.  The cardiology note indicates that he is low-risk for non-cardiac surgery and that he should stop Eliquis for 2 days prior to surgery and resume as soon as it is safe from a surgical perspective.   Past Medical History:  Diagnosis Date  . Arrhythmia    tachycardia, A-Fib  . Arthritis   . Blind right eye   . BPH (benign prostatic hyperplasia)   . Diabetes mellitus   . DVT (deep venous thrombosis) ([place]) 02/2010   leg thrombus ; dislodged into emboli and caused PE  . Dyspnea   . Dysrhythmia   . 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 GERD (gastroesophageal reflux disease)   . Hypercholesteremia   . Hypertension   . Kidney failure   . Neuromuscular disorder ([place])   . Neuropathy   . Pneumonia    time 9 ;last episode 12/2015  . Pulmonary embolus ([place]) 2011  . Seasonal allergies   . Seizures ([place])    as child   . Sleep apnea  BIPAP  . Stiff neck    limited turning s/p titanium plate placement  . TIA (transient ischemic attack)   . Wears dentures    full upper and lower    Past Surgical History:  Procedure Laterality Date  . APPENDECTOMY    . BACK SURGERY     x 8; upper x 3 & lower x 5  . CARDIOVERSION  03/14/13, 10/16   2014 - [place], 2016 - [place]  . CATARACT EXTRACTION W/PHACO Left 10/29/2015   Procedure: CATARACT EXTRACTION PHACO AND INTRAOCULAR LENS PLACEMENT (IOC);  Surgeon: Leandrew Koyanagi, MD;  Location: [place] [facility];  Service: Ophthalmology;  Laterality: Left;  DIABETIC - insulin and oral medsSleep apnea - no machine  . CHOLECYSTECTOMY    . COLONOSCOPY WITH PROPOFOL N/A 01/16/2018   Procedure: COLONOSCOPY WITH PROPOFOL;  Surgeon: Manya Silvas, MD;  Location: [facility] ENDOSCOPY;  Service: Endoscopy;  Laterality: N/A;  . ESOPHAGOGASTRODUODENOSCOPY (EGD) WITH PROPOFOL N/A 01/16/2018   Procedure: ESOPHAGOGASTRODUODENOSCOPY (EGD) WITH PROPOFOL;  Surgeon: Manya Silvas, MD;  Location: [facility] ENDOSCOPY;  Service: Endoscopy;  Laterality: N/A;  . EYE SURGERY    . GALLBLADDER SURGERY  2002  . JOINT REPLACEMENT Right 2018  . KNEE ARTHROSCOPY     left   . ROTATOR CUFF REPAIR  2001   left   . TEE WITHOUT CARDIOVERSION N/A 04/26/2018   Procedure: TRANSESOPHAGEAL ECHOCARDIOGRAM (TEE);  Surgeon: Nelva Bush, MD;  Location: [facility] ORS;  Service: Cardiovascular;  Laterality: N/A;  . TONSILLECTOMY    . TOTAL KNEE ARTHROPLASTY Right 06/20/2017   Procedure: RIGHT TOTAL KNEE ARTHROPLASTY;  Surgeon: Gaynelle Arabian, MD;  Location: [facility] ORS;  Service: Orthopedics;  Laterality: Right;    Family History  Problem Relation Age of Onset  . Heart attack Brother     Social History Social History   Tobacco Use  . Smoking status: Former Smoker    Packs/day: 2.00    Years: 20.00    Pack years: 40.00    Types: Cigarettes    Quit date: 12/26/1989    Years since quitting: 30.5  . Smokeless tobacco: Never Used  Vaping Use  . Vaping Use: Never used  Substance Use Topics  . Alcohol use: No  . Drug use: No    Allergies  Allergen Reactions  . Carisoprodol Itching  . Tamsulosin     Pt stated, "upsets my stomach"    Current Outpatient Medications  Medication Sig Dispense Refill  . amiodarone (PACERONE) 200 MG tablet TAKE 1 TABLET BY MOUTH EVERY DAY (Patient taking differently: Take 200 mg by mouth daily. ) 90 tablet 0  . apixaban (ELIQUIS) 5 MG TABS tablet Take 1 tablet (5 mg total) by mouth 2 (two) times  daily. 60 tablet 2  . cholecalciferol (VITAMIN D3) 25 MCG (1000 UT) tablet Take 1,000 Units by mouth daily.    . cyclobenzaprine (FLEXERIL) 10 MG tablet TAKE 1 TABLET BY MOUTH 3 TIMES A DAY (Patient taking differently: Take 10 mg by mouth 3 (three) times daily. ) 90 tablet 12  . docusate sodium (COLACE) 250 MG capsule Take 250 mg by mouth daily as needed.     . dutasteride (AVODART) 0.5 MG capsule TAKE 1 CAPSULE DAILY 90 capsule 3  . esomeprazole (NEXIUM) 40 MG capsule Take 1 capsule (40 mg total) by mouth 2 (two) times daily. 90 capsule 3  . fexofenadine (ALLEGRA) 180 MG tablet Take 180 mg by mouth daily.      . furosemide (LASIX) 40 MG  tablet Take 40 mg by mouth daily.    [name] gabapentin (NEURONTIN) 100 MG capsule TAKE 1 CAPSULE BY MOUTH TWICE A DAY 180 capsule 2  . hydrochlorothiazide (HYDRODIURIL) 12.5 MG tablet TAKE 2 TABLETS (25 MG TOTAL) BY MOUTH AT BEDTIME. 180 tablet 1  . Insulin Pen Needle (PEN NEEDLES) 31G X 5 MM MISC 1 each by Does not apply route daily. PATIENT NEEDS NOVA TWIST NEEDLES 5 MM  DX E11.9 200 each 3  . JANUVIA 100 MG tablet TAKE 1 TABLET DAILY (Patient taking differently: Take 100 mg by mouth daily. ) 90 tablet 3  . LANTUS SOLOSTAR 100 UNIT/ML Solostar Pen Inject 38 Units into the skin 2 (two) times daily.    [name] liraglutide (VICTOZA) 18 MG/3ML SOPN INJECT 0.3ML (=1.8MG )      SUBCUTANEOUSLY DAILY 27 mL 3  . magnesium oxide (MAG-OX) 400 MG tablet TAKE 1 TABLET (400 MG TOTAL) BY MOUTH 2 (TWO) TIMES DAILY. (Patient taking differently: Take 400 mg by mouth 2 (two) times daily. ) 180 tablet 3  . Melatonin Gummies 2.5 MG CHEW Chew by mouth as needed.    . metoprolol tartrate (LOPRESSOR) 100 MG tablet TAKE 1 TABLET (100 MG TOTAL) BY MOUTH 2 (TWO) TIMES DAILY. 60 tablet 11  . naloxone (NARCAN) 0.4 MG/ML injection INJECT AS NEEDED FOR SUSPECTED OVERDOSE.    . Naloxone HCl 0.4 MG/0.4ML SOAJ Inject as needed for suspected overdose. 0.4 mL 2  . NOVOLOG FLEXPEN 100 UNIT/ML FlexPen INJECT 12  UNITS            SUBCUTANEOUSLY 3 TIMES A   DAY WITH MEALS (Patient taking differently: 12 Units in the morning and at bedtime. ) 15 mL 5  . NOVOTWIST 32G X 5 MM MISC USE 6 TIMES A DAY DX E11.9 100 each 9  . Omega-3 Fatty Acids (FISH OIL) 1200 MG CAPS Take 1,200 mg by mouth 2 (two) times daily.     Glory Rosebush ULTRA test strip USE WITH METER TWICE DAILY 100 strip 10  . oxyCODONE (OXYCONTIN) 40 mg 12 hr tablet Take 1 tablet (40 mg total) by mouth every 8 (eight) hours as needed. 270 tablet 0  . pregabalin (LYRICA) 200 MG capsule TAKE 1 CAPSULE 3 TIMES A   DAY (Patient taking differently: Take 200 mg by mouth 3 (three) times daily. ) 270 capsule 1  . rOPINIRole (REQUIP) 1 MG tablet TAKE 1 TO 2 TABLETS BY MOUTH AT BEDTIME AS NEEDED (Patient taking differently: Take 1 mg by mouth in the morning. ) 60 tablet 1  . rOPINIRole (REQUIP) 2 MG tablet Take 1 tablet twice a day as needed. (Patient taking differently: Take 2 mg by mouth at bedtime. ) 60 tablet 5  . sucralfate (CARAFATE) 1 g tablet TAKE 1 TABLET BY MOUTH 4 (FOUR) TIMES DAILY - WITH MEALS AND AT BEDTIME. (Patient taking differently: Take 1 g by mouth 4 (four) times daily. ) 360 tablet 1  . testosterone cypionate (DEPOTESTOSTERONE CYPIONATE) 200 MG/ML injection Inject 200 mg into the muscle every 21 ( twenty-one) days.      No current facility-administered medications for this visit.    Review of Systems Review of Systems  All other systems reviewed and are negative. or as discussed in the HPI  Blood pressure (!) 161/106, pulse 76, temperature 98.6 F (37 C), temperature source Oral, resp. rate 12, height 5\' 11"  (1.803 m), weight (!) 263 lb (119.3 kg), SpO2 93 %. Body mass index is 36.68 kg/m.  Physical  Exam Physical Exam Constitutional:      General: He is not in acute distress.    Appearance: He is obese.  HENT:     Head: Normocephalic and atraumatic.     Nose:     Comments: Covered with a mask    Mouth/Throat:     Comments:  Covered with a mask Eyes:     General: No scleral icterus.       Right eye: No discharge.        Left eye: No discharge.  Neck:     Comments: Vertical scar on left neck from 2 prior ACDF procedures. ROM limited.  No palpable masses. Cardiovascular:     Rate and Rhythm: Normal rate.  Pulmonary:     Effort: Pulmonary effort is normal. No respiratory distress.  Abdominal:     General: Bowel sounds are normal.     Palpations: Abdomen is soft.  Genitourinary:    Comments: deferred Musculoskeletal:        General: No swelling.     Right lower leg: No edema.     Left lower leg: No edema.     Comments: Venous stasis skin changes  Skin:    General: Skin is warm and dry.  Neurological:     General: No focal deficit present.     Mental Status: He is alert.  Psychiatric:        Mood and Affect: Mood normal.        Behavior: Behavior normal.     Data Reviewed Results for Jerry Parsons, Jerry Parsons (MRN [id number]) as of 07/17/2020 13:32  Ref. Range 06/24/2020 13:14  Calcium, Ur Latest Ref Range: Not Estab. mg/dL 17.4  Calcium, 24 hour urine Latest Ref Range: 47 - 462 mg/24 hr 383 (H)  Total Volume Unknown 2,200  Creatinine, Urine Latest Units: mg/dL 73  Urine Total Volume-UCRE24 Latest Units: mL 2,200  Collection Interval-UCRE24 Latest Units: hours 24  Creatinine, 24H Ur Latest Ref Range: 800 - 2,000 mg/day 1,606  Results for Jerry Parsons, Jerry Parsons (MRN [id number]) as of 07/17/2020 13:32  Ref. Range 06/23/2020 83:41  BASIC METABOLIC PANEL Unknown Rpt (A)  Sodium Latest Ref Range: 135 - 145 mmol/L 140  Potassium Latest Ref Range: 3.5 - 5.1 mmol/L 4.6  Chloride Latest Ref Range: 98 - 111 mmol/L 101  CO2 Latest Ref Range: 22 - 32 mmol/L 31  Glucose Latest Ref Range: 70 - 99 mg/dL 171 (H)  BUN Latest Ref Range: 8 - 23 mg/dL 23  Creatinine Latest Ref Range: 0.61 - 1.24 mg/dL 1.27 (H)  Calcium Latest Ref Range: 8.9 - 10.3 mg/dL 11.1 (H)  Anion gap Latest Ref Range: 5 - 15  8  GFR, Est Non African American  Latest Ref Range: >60 mL/min 57 (L)  GFR, Est African American Latest Ref Range: >60 mL/min >60  Results for Jerry Parsons, Jerry Parsons (MRN [id number]) as of 07/17/2020 13:32  Ref. Range 05/19/2020 14:34 05/29/2020 09:56  PTH, Intact Latest Ref Range: 15 - 65 pg/mL 53 41  PTH Interp Unknown Comment Comment    These labs show elevated calcium, even off of HCTZ and an inappropriately normal PTH value.  He also has elevated UCa and decreased GFR.  I reviewed the cardiology note from 07/02/20, as summarized above.  Assessment This is a 69 year old man with hypercalcemia and biochemical evidence of primary hyperparathyroidism.  His hypercalcemia has persisted off of hydrochlorothiazide and he does have a decreased GFR.  Surgery is indicated.  Plan The risks of  parathyroid surgery were discussed with the patient, including (but not limited to): bleeding, infection, damage to surrounding structures/tissues, injury (temporary or permanent) to the recurrent laryngeal nerve, hypocalcemia (temporary or permanent), need to take calcium and/or vitamin D supplementation, recurrent hyperparathyroidism, failure to correct hyperparathyroidism, need for additional surgery.  The patient and his wife had the opportunity to ask any questions and these were answered to their satisfaction.  We will get a NM parathyroid scan for pre-operative localization, get formal cardiology clearance, and plan for an overnight observation post-op, due to his multiple medical comorbid condition.    Jerry Parsons 07/17/2020, 1:20 PM

## 2020-07-17 NOTE — Telephone Encounter (Signed)
Cardiac Clearance faxed to [place] today.

## 2020-07-17 NOTE — Patient Instructions (Addendum)
Our surgery scheduler will call you to schedule your surgery within 24-48 hours. Please have the Blue sheet available when speaking with her.   Cardiac Clearance faxed to [place].  NM Parathyroid  Scan scheduled 07/24/20 @ 11:30 am. Someone will call you the day before to discuss further information with you.   Please call our office if you have questions or concerns.     Parathyroidectomy  A parathyroidectomy is a surgery to remove one or more parathyroid glands. These glands are in the neck. Each gland is very small, about the size of a pea. Most people have four parathyroid glands. The glands produce parathyroid hormone, which helps to control the level of calcium in the body. You may have a parathyroidectomy if your body produces too much parathyroid hormone (hyperparathyroidism). This usually occurs when one or more of your parathyroid glands becomes enlarged from a type of noncancerous tumor (adenoma). Tell a health care provider about:  Any allergies you have.  All medicines you are taking, including vitamins, herbs, eye drops, creams, and over-the-counter medicines.  Any problems you or family members have had with anesthetic medicines.  Any blood disorders you have.  Any surgeries you have had.  Any medical conditions you have.  Whether you are pregnant or may be pregnant. What are the risks? Generally, this is a safe procedure. However, problems may occur, including:  Bleeding.  Infection.  Allergic reactions to medicines.  Damage to the nerves of your voice box (larynx). This can be temporary or long-term (rare).  Damage to nearby structures and organs, such as the skin (scarring), surrounding blood vessels, and nerves in the neck.  Hoarseness. This usually resolves in 24-48 hours.  A condition in which your body does not make enough parathyroid hormone (hypoparathyroidism). This is rare.  Difficulty breathing. This is rare. What happens before the  procedure? Staying hydrated Follow instructions from your health care provider about hydration, which may include:  Up to 2 hours before the procedure - you may continue to drink clear liquids, such as water, clear fruit juice, black coffee, and plain tea. Eating and drinking restrictions Follow instructions from your health care provider about eating and drinking, which may include:  8 hours before the procedure - stop eating heavy meals or foods such as meat, fried foods, or fatty foods.  6 hours before the procedure - stop eating light meals or foods, such as toast or cereal.  6 hours before the procedure - stop drinking milk or drinks that contain milk.  2 hours before the procedure - stop drinking clear liquids. Medicines Ask your health care provider about:  Changing or stopping your regular medicines. This is especially important if you are taking diabetes medicines or blood thinners.  Taking medicines such as aspirin and ibuprofen. These medicines can thin your blood. Do not take these medicines unless your health care provider tells you to take them.  Taking over-the-counter medicines, vitamins, herbs, and supplements. General instructions  You may be asked to shower with a germ-killing soap.  Plan to have someone take you home from the hospital or clinic.  Plan to have a responsible adult care for you for at least 24 hours after you leave the hospital or clinic. This is important. What happens during the procedure?  To lower your risk of infection: ? Your health care team will wash or sanitize their hands. ? Hair may be removed from the surgical area. ? Your skin will be washed with soap.  An IV will be inserted into one of your veins.  You will be given one or more of the following: ? A medicine to help you relax (sedative). ? A medicine to make you fall asleep (general anesthetic).  An incision will be made according to the type of parathyroidectomy procedure  you are having. There are four methods that may be used: ? Open surgery. A single incision will be made in the center of your neck. The incision will be about 2-4 inches long. ? Minimally invasive surgery. A small incision will be made in the side of your neck. This incision will be about 1-2 inches long. Before the procedure, you might be given an injection of a type of medicine that will help the surgeon to locate the gland. ? Video-assisted surgery. Two small incisions will be made in your neck. One incision is for the instruments that will be used to remove the gland. The other incision is for a tiny camera that will help the surgeon to see inside your neck. ? Endoscopic surgery. An incision will be made just above your collarbone. A small, flexible tube (endoscope) will be inserted through this incision.  Your health care provider may monitor laryngeal nerve function during the procedure for safety reasons.  The gland or glands that are causing problems will be removed.  The incisions will be closed using stitches (sutures) or other methods. The sutures will often be hidden under the skin. The procedure may vary among health care providers and hospitals. What happens after the procedure?  Your blood pressure, heart rate, breathing rate, and blood oxygen level will be monitored until the medicines you were given have worn off.  You will be given pain medicine as needed.  Your provider will check your ability to talk and swallow after the procedure.  You will gradually start to drink liquids and have soft foods as tolerated.  Your blood will be tested to check the calcium level in your body.  Do not drive for 24 hours if you were given a sedative during your procedure. Summary  The parathyroid glands are located in the neck and produce parathyroid hormone, which helps to control the level of calcium in the body.  A parathyroidectomy is a surgery to remove one or more parathyroid  glands.  You may have a parathyroidectomy if your body produces too much parathyroid hormone (hyperparathyroidism).  There are four surgical methods that may be used for a parathyroidectomy: open, minimally invasive, video-assisted, and endoscopic.  Generally, this is a safe procedure. However, problems may occur, including bleeding, infection, and a hoarse or weak voice. This information is not intended to replace advice given to you by your health care provider. Make sure you discuss any questions you have with your health care provider. Document Revised: 11/25/2017 Document Reviewed: 10/18/2017 Elsevier Patient Education  2020 [employer].

## 2020-07-18 ENCOUNTER — Telehealth: Payer: Self-pay | Admitting: General Surgery

## 2020-07-18 NOTE — Telephone Encounter (Signed)
Patient with diagnosis of A Fib  on Eliquis for anticoagulation.    Procedure: parathyroidectomy  Date of procedure: 07/25/20  CHADS2-VASc score of  7 (CHF, HTN, AGE, DM2, stroke/tia x 2, CAD)  CrCl 65 mL/min using adjusted body weight Platelet count 120000  Patient with history of TIA and DVT, will be very high risk off anticoagulation.  Will send to Dr. Rockey Situ for input.

## 2020-07-18 NOTE — Telephone Encounter (Signed)
Pt has been advised of Pre-Admission date/time, COVID Testing date and Surgery date.  Surgery Date: 07/25/20 Preadmission Testing Date: 07/22/20 (phone 8a-1p) Covid Testing Date: 07/23/20 - patient advised to go to the [facility] ([place]) between 8a-1p    Patient has been made aware to call [phone], between 1-3:00pm the day before surgery, to find out what time to arrive for surgery.

## 2020-07-20 NOTE — Telephone Encounter (Signed)
Acceptable risk to hold eliquis 2-3 days prior to procedure No further cardiac  testing needed Would restart anticoagulation post procedure per surgical team

## 2020-07-21 ENCOUNTER — Telehealth: Payer: Self-pay | Admitting: Emergency Medicine

## 2020-07-21 NOTE — Telephone Encounter (Signed)
Cardiac clearance notes in Epic from 07/17/20 Dr Rockey Situ.   "Acceptable risk to hold eliquis 2-3 days prior to procedure No further cardiac  testing needed Would restart anticoagulation post procedure per surgical team"  Patient made aware to hold Eliquis 3 days prior to sx per Dr Celine Ahr.

## 2020-07-22 ENCOUNTER — Ambulatory Visit (INDEPENDENT_AMBULATORY_CARE_PROVIDER_SITE_OTHER): Payer: Medicare Other

## 2020-07-22 ENCOUNTER — Encounter
Admission: RE | Admit: 2020-07-22 | Discharge: 2020-07-22 | Disposition: A | Discharge status: Home / Self Care | Payer: Medicare Other | Source: Ambulatory Visit | Attending: General Surgery | Admitting: General Surgery

## 2020-07-22 ENCOUNTER — Other Ambulatory Visit: Payer: Self-pay

## 2020-07-22 ENCOUNTER — Ambulatory Visit (INDEPENDENT_AMBULATORY_CARE_PROVIDER_SITE_OTHER): Payer: Medicare Other | Admitting: Sports Medicine

## 2020-07-22 ENCOUNTER — Encounter: Payer: Self-pay | Admitting: Sports Medicine

## 2020-07-22 DIAGNOSIS — S92422D Displaced fracture of distal phalanx of left great toe, subsequent encounter for fracture with routine healing: Secondary | ICD-10-CM

## 2020-07-22 DIAGNOSIS — E1142 Type 2 diabetes mellitus with diabetic polyneuropathy: Secondary | ICD-10-CM

## 2020-07-22 DIAGNOSIS — M25571 Pain in right ankle and joints of right foot: Secondary | ICD-10-CM | POA: Diagnosis not present

## 2020-07-22 DIAGNOSIS — Z01812 Encounter for preprocedural laboratory examination: Secondary | ICD-10-CM | POA: Insufficient documentation

## 2020-07-22 DIAGNOSIS — Z20822 Contact with and (suspected) exposure to covid-19: Secondary | ICD-10-CM | POA: Insufficient documentation

## 2020-07-22 DIAGNOSIS — I739 Peripheral vascular disease, unspecified: Secondary | ICD-10-CM

## 2020-07-22 HISTORY — DX: Headache, unspecified: R51.9

## 2020-07-22 NOTE — Progress Notes (Signed)
Subjective: Jerry Parsons is a 69 y.o. male patient with history of diabetes who returns to office for follow-up evaluation of left first toe fracture.  Patient reports pain is better with a little bit of soreness at the left great toe most pain today at the right ankle reports that few weeks ago he turned the right ankle inward and is having some pain when he does a lot of walking 5 out of 10 to the lateral ankle and reports that with his surgical shoe on the left foot he has become unstable and this could have added to him having turning his right foot when he was out in the yard working on the lawnmower. Patient is assisted by wife which helps to report this history this visit. No other pedal complaints noted.  Patient Active Problem List   Diagnosis Date Noted  . Loss of consciousness ([place]) 06/05/2020  . Osteoarthritis of both knees 11/11/2018  . OSA on CPAP 10/14/2018  . MRSA bacteremia 05/19/2018  . Lumbar spondylosis 05/05/2018  . Spinal stenosis of lumbar region 05/05/2018  . Constipation, chronic 03/08/2018  . Pain in limb 03/03/2018  . Gastroesophageal reflux disease without esophagitis 01/04/2018  . OA (osteoarthritis) of knee 06/20/2017  . Sepsis due to pneumonia ([place]) 05/07/2017  . Neuropathy 10/13/2016  . Amblyopia 12/30/2015  . 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 NS (nuclear sclerosis) 12/30/2015  . Pseudoaphakia 12/30/2015  . Dehydration 09/08/2015  . Aspiration pneumonia ([place]) 07/04/2015  . Paroxysmal atrial fibrillation ([place]) 07/04/2015  . Arthritis of knee, degenerative 05/28/2015  . Chronic back pain 09/17/2014  . Leg edema 05/07/2014  . Chronic diastolic CHF (congestive heart failure) ([place]) 05/07/2014  . Campylobacter diarrhea 04/25/2013  . Atrial flutter ([place]) 01/23/2013  . Obesity 05/19/2012  . Hyperlipidemia 11/11/2011  . Diastolic dysfunction 74/25/9563  . SOB (shortness of breath) 04/12/2011  . Diabetes mellitus type 2, uncontrolled, with complications ([place])  87/56/4332  . HYPERTENSION, BENIGN 03/15/2011  . DVT 03/15/2011  . TACHYCARDIA 03/15/2011   Current Outpatient Medications on File Prior to Visit  Medication Sig Dispense Refill  . amiodarone (PACERONE) 200 MG tablet TAKE 1 TABLET BY MOUTH EVERY DAY (Patient taking differently: Take 200 mg by mouth every morning. ) 90 tablet 0  . apixaban (ELIQUIS) 5 MG TABS tablet Take 1 tablet (5 mg total) by mouth 2 (two) times daily. 60 tablet 2  . cholecalciferol (VITAMIN D3) 25 MCG (1000 UT) tablet Take 1,000 Units by mouth daily.    . cyclobenzaprine (FLEXERIL) 10 MG tablet TAKE 1 TABLET BY MOUTH 3 TIMES A DAY (Patient taking differently: Take 10 mg by mouth 3 (three) times daily. ) 90 tablet 12  . docusate sodium (COLACE) 250 MG capsule Take 250 mg by mouth daily as needed for constipation.     . dutasteride (AVODART) 0.5 MG capsule TAKE 1 CAPSULE DAILY (Patient taking differently: Take 0.5 mg by mouth every morning. ) 90 capsule 3  . esomeprazole (NEXIUM) 40 MG capsule Take 1 capsule (40 mg total) by mouth 2 (two) times daily. 90 capsule 3  . fexofenadine (ALLEGRA) 180 MG tablet Take 180 mg by mouth every morning.     . furosemide (LASIX) 40 MG tablet Take 40 mg by mouth every morning.     . gabapentin (NEURONTIN) 100 MG capsule TAKE 1 CAPSULE BY MOUTH TWICE A DAY (Patient taking differently: Take 100 mg by mouth 2 (two) times daily. ) 180 capsule 2  . hydrochlorothiazide (HYDRODIURIL) 12.5 MG tablet TAKE  2 TABLETS (25 MG TOTAL) BY MOUTH AT BEDTIME. 180 tablet 1  . Insulin Pen Needle (PEN NEEDLES) 31G X 5 MM MISC 1 each by Does not apply route daily. PATIENT NEEDS NOVA TWIST NEEDLES 5 MM  DX E11.9 200 each 3  . JANUVIA 100 MG tablet TAKE 1 TABLET DAILY (Patient taking differently: Take 100 mg by mouth daily. ) 90 tablet 3  . LANTUS SOLOSTAR 100 UNIT/ML Solostar Pen Inject 38 Units into the skin 2 (two) times daily.    [name] liraglutide (VICTOZA) 18 MG/3ML SOPN INJECT 0.3ML (=1.8MG)      SUBCUTANEOUSLY DAILY  (Patient taking differently: Inject 1.8 mg into the skin daily. ) 27 mL 3  . magnesium oxide (MAG-OX) 400 MG tablet TAKE 1 TABLET (400 MG TOTAL) BY MOUTH 2 (TWO) TIMES DAILY. (Patient taking differently: Take 400 mg by mouth 2 (two) times daily. ) 180 tablet 3  . Melatonin Gummies 2.5 MG CHEW Chew 5 mg by mouth at bedtime.     . metoprolol tartrate (LOPRESSOR) 100 MG tablet TAKE 1 TABLET (100 MG TOTAL) BY MOUTH 2 (TWO) TIMES DAILY. 60 tablet 11  . naloxone (NARCAN) 0.4 MG/ML injection INJECT AS NEEDED FOR SUSPECTED OVERDOSE.    . Naloxone HCl 0.4 MG/0.4ML SOAJ Inject as needed for suspected overdose. 0.4 mL 2  . NOVOLOG FLEXPEN 100 UNIT/ML FlexPen INJECT 12 UNITS            SUBCUTANEOUSLY 3 TIMES A   DAY WITH MEALS (Patient taking differently: Inject 14 Units into the skin in the morning and at bedtime. ) 15 mL 5  . NOVOTWIST 32G X 5 MM MISC USE 6 TIMES A DAY DX E11.9 100 each 9  . Omega-3 Fatty Acids (FISH OIL) 1200 MG CAPS Take 1,200 mg by mouth 2 (two) times daily.     Glory Rosebush ULTRA test strip USE WITH METER TWICE DAILY 100 strip 10  . oxyCODONE (OXYCONTIN) 40 mg 12 hr tablet Take 1 tablet (40 mg total) by mouth every 8 (eight) hours as needed. (Patient taking differently: Take 40 mg by mouth every 8 (eight) hours. ) 270 tablet 0  . pregabalin (LYRICA) 200 MG capsule TAKE 1 CAPSULE 3 TIMES A   DAY (Patient taking differently: Take 200 mg by mouth 3 (three) times daily. ) 270 capsule 1  . rOPINIRole (REQUIP) 1 MG tablet TAKE 1 TO 2 TABLETS BY MOUTH AT BEDTIME AS NEEDED (Patient taking differently: Take 1 mg by mouth in the morning. ) 60 tablet 1  . rOPINIRole (REQUIP) 2 MG tablet Take 1 tablet twice a day as needed. (Patient taking differently: Take 2 mg by mouth at bedtime. ) 60 tablet 5  . sucralfate (CARAFATE) 1 g tablet TAKE 1 TABLET BY MOUTH 4 (FOUR) TIMES DAILY - WITH MEALS AND AT BEDTIME. (Patient taking differently: Take 2 g by mouth 2 (two) times daily. ) 360 tablet 1  . tamsulosin  (FLOMAX) 0.4 MG CAPS capsule Take 0.4 mg by mouth daily.    [name] testosterone cypionate (DEPOTESTOSTERONE CYPIONATE) 200 MG/ML injection Inject 200 mg into the muscle every 21 ( twenty-one) days.      No current facility-administered medications on file prior to visit.   Allergies  Allergen Reactions  . Carisoprodol Itching  . Tamsulosin     Pt stated, "upsets my stomach"    Recent Results (from the past 2160 hour(s))  Lipid panel     Status: Abnormal   Collection Time: 05/15/20 10:23 AM  Result Value Ref Range   Cholesterol, Total 134 100 - 199 mg/dL   Triglycerides 233 (H) 0 - 149 mg/dL   HDL 29 (L) >39 mg/dL   VLDL Cholesterol Cal 38 5 - 40 mg/dL   LDL Chol Calc (NIH) 67 0 - 99 mg/dL   Chol/HDL Ratio 4.6 0.0 - 5.0 ratio    Comment:                                   T. Chol/HDL Ratio                                             Men  Women                               1/2 Avg.Risk  3.4    3.3                                   Avg.Risk  5.0    4.4                                2X Avg.Risk  9.6    7.1                                3X Avg.Risk 23.4   11.0   TSH     Status: None   Collection Time: 05/15/20 10:23 AM  Result Value Ref Range   TSH 0.491 0.450 - 4.500 uIU/mL  Comprehensive Metabolic Panel (CMET)     Status: Abnormal   Collection Time: 05/15/20 10:23 AM  Result Value Ref Range   Glucose 135 (H) 65 - 99 mg/dL   BUN 20 8 - 27 mg/dL   Creatinine, Ser 1.33 (H) 0.76 - 1.27 mg/dL   GFR calc non Af Amer 54 (L) >59 mL/min/1.73   GFR calc Af Amer 62 >59 mL/min/1.73    Comment: **[facility] currently reports eGFR in compliance with the current**   recommendations of the [employer]. [facility] will   update reporting as new guidelines are published from the NKF-ASN   Task force.    BUN/Creatinine Ratio 15 10 - 24   Sodium 140 134 - 144 mmol/L   Potassium 4.1 3.5 - 5.2 mmol/L   Chloride 99 96 - 106 mmol/L   CO2 31 (H) 20 - 29 mmol/L   Calcium 11.0 (H) 8.6 -  10.2 mg/dL   Total Protein 6.6 6.0 - 8.5 g/dL   Albumin 4.0 3.8 - 4.8 g/dL   Globulin, Total 2.6 1.5 - 4.5 g/dL   Albumin/Globulin Ratio 1.5 1.2 - 2.2   Bilirubin Total 0.6 0.0 - 1.2 mg/dL   Alkaline Phosphatase 117 48 - 121 IU/L    Comment:               **Please note reference interval change**   AST 23 0 - 40 IU/L   ALT 26 0 - 44 IU/L  CBC w/Diff/Platelet     Status: Abnormal   Collection Time: 05/15/20 10:23 AM  Result Value Ref Range   WBC 6.2 3.4 - 10.8 x10E3/uL   RBC 5.68 4.14 - 5.80 x10E6/uL   Hemoglobin 13.7 13.0 - 17.7 g/dL   Hematocrit 44.4 37.5 - 51.0 %   MCV 78 (L) 79 - 97 fL   MCH 24.1 (L) 26.6 - 33.0 pg   MCHC 30.9 (L) 31 - 35 g/dL   RDW 16.5 (H) 11.6 - 15.4 %   Platelets 157 150 - 450 x10E3/uL   Neutrophils 47 Not Estab. %   Lymphs 39 Not Estab. %   Monocytes 10 Not Estab. %   Eos 3 Not Estab. %   Basos 1 Not Estab. %   Neutrophils Absolute 2.9 1 - 7 x10E3/uL   Lymphocytes Absolute 2.4 0 - 3 x10E3/uL   Monocytes Absolute 0.6 0 - 0 x10E3/uL   EOS (ABSOLUTE) 0.2 0.0 - 0.4 x10E3/uL   Basophils Absolute 0.0 0 - 0 x10E3/uL   Immature Granulocytes 0 Not Estab. %   Immature Grans (Abs) 0.0 0.0 - 0.1 x10E3/uL  Hemoglobin A1C     Status: Abnormal   Collection Time: 05/15/20 10:23 AM  Result Value Ref Range   Hgb A1c MFr Bld 7.7 (H) 4.8 - 5.6 %    Comment:          Prediabetes: 5.7 - 6.4          Diabetes: >6.4          Glycemic control for adults with diabetes: <7.0    Est. average glucose Bld gHb Est-mCnc 174 mg/dL  Calcium, ionized     Status: Abnormal   Collection Time: 05/19/20  2:34 PM  Result Value Ref Range   Calcium, Ion 6.2 (H) 4.5 - 5.6 mg/dL  PTH, intact and calcium     Status: Abnormal   Collection Time: 05/19/20  2:34 PM  Result Value Ref Range   Calcium 11.6 (H) 8.6 - 10.2 mg/dL   PTH 53 15 - 65 pg/mL   PTH Interp Comment     Comment: Interpretation                 Intact PTH    Calcium                                 (pg/mL)       (mg/dL) Normal                          15 - 65     8.6 - 10.2 Primary Hyperparathyroidism         >65          >10.2 Secondary Hyperparathyroidism       >65          <10.2 Non-Parathyroid Hypercalcemia       <65          >10.2 Hypoparathyroidism                  <15          < 8.6 Non-Parathyroid Hypocalcemia    15 - 65          < 8.6   Vitamin B12     Status: None   Collection Time: 05/19/20  2:34 PM  Result Value Ref Range   Vitamin B-12 474 232 - 1,245 pg/mL  Iron, TIBC and Ferritin Panel     Status: Abnormal  Collection Time: 05/19/20  2:34 PM  Result Value Ref Range   Total Iron Binding Capacity 453 (H) 250 - 450 ug/dL   UIBC 371 (H) 111 - 343 ug/dL   Iron 82 38 - 169 ug/dL   Iron Saturation 18 15 - 55 %   Ferritin 22 (L) 30.0 - 400.0 ng/mL  Protein Electrophoresis, (serum)     Status: None   Collection Time: 05/29/20  9:56 AM  Result Value Ref Range   Total Protein 6.5 6.0 - 8.5 g/dL   Albumin ELP 3.4 2.9 - 4.4 g/dL   Alpha 1 0.2 0.0 - 0.4 g/dL   Alpha 2 0.8 0.4 - 1.0 g/dL   Beta 1.1 0.7 - 1.3 g/dL   Gamma Globulin 1.0 0.4 - 1.8 g/dL   M-Spike, % Not Observed Not Observed g/dL   Globulin, Total 3.1 2.2 - 3.9 g/dL   A/G Ratio 1.1 0.7 - 1.7   Please Note: Comment     Comment: Protein electrophoresis scan will follow via computer, mail, or courier delivery.    Interpretation: Comment     Comment: The SPE pattern appears unremarkable. Evidence of monoclonal protein is not apparent.   Vitamin D (25 hydroxy)     Status: None   Collection Time: 05/29/20  9:56 AM  Result Value Ref Range   Vit D, 25-Hydroxy 40.4 30.0 - 100.0 ng/mL    Comment: Vitamin D deficiency has been defined by the [place] practice guideline as a level of serum 25-OH vitamin D less than 20 ng/mL (1,2). The Endocrine Society went on to further define vitamin D insufficiency as a level between 21 and 29 ng/mL (2). 1. IOM (Institute of Medicine). 2010. Dietary  reference    intakes for calcium and D. [place]: The    [employer]. 2. Holick MF, Binkley [place], Bischoff-Ferrari HA, et al.    Evaluation, treatment, and prevention of vitamin D    deficiency: an Endocrine Society clinical practice    guideline. JCEM. 2011 Jul; 96(7):1911-30.   Calcium, ionized     Status: Abnormal   Collection Time: 05/29/20  9:56 AM  Result Value Ref Range   Calcium, Ion 6.0 (H) 4.5 - 5.6 mg/dL  PTH, Intact and Calcium     Status: Abnormal   Collection Time: 05/29/20  9:56 AM  Result Value Ref Range   Calcium 11.4 (H) 8.6 - 10.2 mg/dL   PTH 41 15 - 65 pg/mL   PTH Interp Comment     Comment: Interpretation                 Intact PTH    Calcium                                 (pg/mL)      (mg/dL) Normal                          15 - 65     8.6 - 10.2 Primary Hyperparathyroidism         >65          >10.2 Secondary Hyperparathyroidism       >65          <10.2 Non-Parathyroid Hypercalcemia       <65          >10.2 Hypoparathyroidism                  <  15          < 8.6 Non-Parathyroid Hypocalcemia    15 - 65          < 8.6   Comprehensive metabolic panel     Status: Abnormal   Collection Time: 06/05/20  2:44 PM  Result Value Ref Range   Sodium 139 135 - 145 mmol/L   Potassium 3.4 (L) 3.5 - 5.1 mmol/L   Chloride 102 98 - 111 mmol/L   CO2 30 22 - 32 mmol/L   Glucose, Bld 179 (H) 70 - 99 mg/dL    Comment: Glucose reference range applies only to samples taken after fasting for at least 8 hours.   BUN 25 (H) 8 - 23 mg/dL   Creatinine, Ser 1.39 (H) 0.61 - 1.24 mg/dL   Calcium 10.4 (H) 8.9 - 10.3 mg/dL   Total Protein 6.6 6.5 - 8.1 g/dL   Albumin 3.5 3.5 - 5.0 g/dL   AST 26 15 - 41 U/L   ALT 28 0 - 44 U/L   Alkaline Phosphatase 93 38 - 126 U/L   Total Bilirubin 0.7 0.3 - 1.2 mg/dL   GFR calc non Af Amer 51 (L) >60 mL/min   GFR calc Af Amer 59 (L) >60 mL/min   Anion gap 7 5 - 15    Comment: Performed at [facility], [place], [place] [address]  Troponin I (High Sensitivity)     Status: None   Collection Time: 06/05/20  2:44 PM  Result Value Ref Range   Troponin I (High Sensitivity) 7 <18 ng/L    Comment: (NOTE) Elevated high sensitivity troponin I (hsTnI) values and significant  changes across serial measurements may suggest ACS but many other  chronic and acute conditions are known to elevate hsTnI results.  Refer to the "Links" section for chest pain algorithms and additional  guidance. Performed at [facility], [address] [place], [place] [address]   Lactic acid, plasma     Status: None   Collection Time: 06/05/20  2:44 PM  Result Value Ref Range   Lactic Acid, Venous 1.9 0.5 - 1.9 mmol/L    Comment: Performed at [facility], [address] [place], [place] [address]  CBC with Differential     Status: Abnormal   Collection Time: 06/05/20  2:44 PM  Result Value Ref Range   WBC 6.2 4.0 - 10.5 K/uL   RBC 5.14 4.22 - 5.81 MIL/uL   Hemoglobin 12.8 (L) 13.0 - 17.0 g/dL   HCT 41.3 39 - 52 %   MCV 80.4 80.0 - 100.0 fL   MCH 24.9 (L) 26.0 - 34.0 pg   MCHC 31.0 30.0 - 36.0 g/dL   RDW 16.9 (H) 11.5 - 15.5 %   Platelets 115 (L) 150 - 400 K/uL    Comment: Immature Platelet Fraction may be clinically indicated, consider ordering this additional test [id number]    nRBC 0.0 0.0 - 0.2 %   Neutrophils Relative % 58 %   Neutro Abs 3.5 1.7 - 7.7 K/uL   Lymphocytes Relative 30 %   Lymphs Abs 1.9 0.7 - 4.0 K/uL   Monocytes Relative 9 %   Monocytes Absolute 0.6 0 - 1 K/uL   Eosinophils Relative 3 %   Eosinophils Absolute 0.2 0 - 0 K/uL   Basophils Relative 0 %   Basophils Absolute 0.0 0 - 0 K/uL   WBC Morphology MORPHOLOGY UNREMARKABLE    RBC Morphology MORPHOLOGY  UNREMARKABLE    Smear Review Normal platelet morphology    Immature Granulocytes 0 %   Abs Immature Granulocytes 0.01 0.00 - 0.07 K/uL    Comment: Performed at [facility], [address]  [place], [place] [address]  TSH     Status: Abnormal   Collection Time: 06/05/20  2:44 PM  Result Value Ref Range   TSH 0.034 (L) 0.350 - 4.500 uIU/mL    Comment: Performed by a 3rd Generation assay with a functional sensitivity of <=0.01 uIU/mL. Performed at [facility], [address] [place], [place] [address]   CK     Status: None   Collection Time: 06/05/20  2:44 PM  Result Value Ref Range   Total CK 153 49.0 - 397.0 U/L    Comment: Performed at [facility], [address] [place], [place] [address]  Glucose, capillary     Status: Abnormal   Collection Time: 06/05/20  2:51 PM  Result Value Ref Range   Glucose-Capillary 197 (H) 70 - 99 mg/dL    Comment: Glucose reference range applies only to samples taken after fasting for at least 8 hours.  Lactic acid, plasma     Status: None   Collection Time: 06/05/20  6:49 PM  Result Value Ref Range   Lactic Acid, Venous 1.5 0.5 - 1.9 mmol/L    Comment: Performed at [facility], [place], [place] [address]  Troponin I (High Sensitivity)     Status: None   Collection Time: 06/05/20  6:49 PM  Result Value Ref Range   Troponin I (High Sensitivity) 6 <18 ng/L    Comment: (NOTE) Elevated high sensitivity troponin I (hsTnI) values and significant  changes across serial measurements may suggest ACS but many other  chronic and acute conditions are known to elevate hsTnI results.  Refer to the "Links" section for chest pain algorithms and additional  guidance. Performed at [facility], [address] [place], [place] [address]   SARS Coronavirus 2 by RT PCR (hospital order, performed in [facility] hospital lab) Nasopharyngeal Nasopharyngeal Swab     Status: None   Collection Time: 06/05/20  8:54 PM   Specimen: Nasopharyngeal Swab  Result Value Ref Range   SARS Coronavirus 2 NEGATIVE NEGATIVE    Comment: (NOTE) SARS-CoV-2 target nucleic acids are NOT DETECTED.  The SARS-CoV-2 RNA is  generally detectable in upper and lower respiratory specimens during the acute phase of infection. The lowest concentration of SARS-CoV-2 viral copies this assay can detect is 250 copies / mL. A negative result does not preclude SARS-CoV-2 infection and should not be used as the sole basis for treatment or other patient management decisions.  A negative result may occur with improper specimen collection / handling, submission of specimen other than nasopharyngeal swab, presence of viral mutation(s) within the areas targeted by this assay, and inadequate number of viral copies (<250 copies / mL). A negative result must be combined with clinical observations, patient history, and epidemiological information.  Fact Sheet for Patients:   StrictlyIdeas.no  Fact Sheet for Healthcare Providers: BankingDealers.co.za  This test is not yet approved or  cleared by the [place] FDA and has been authorized for detection and/or diagnosis of SARS-CoV-2 by FDA under an Emergency Use Authorization (EUA).  This EUA will remain in effect (meaning this test can be used) for the duration of the COVID-19 declaration under Section 564(b)(1) of the Act, 21 U.S.C. section 360bbb-3(b)(1), unless the authorization  is terminated or revoked sooner.  Performed at [facility], [address] [place], [place] [address]   Glucose, capillary     Status: Abnormal   Collection Time: 06/05/20 10:15 PM  Result Value Ref Range   Glucose-Capillary 282 (H) 70 - 99 mg/dL    Comment: Glucose reference range applies only to samples taken after fasting for at least 8 hours.  HIV Antibody (routine testing w rflx)     Status: None   Collection Time: 06/06/20  4:30 AM  Result Value Ref Range   HIV Screen 4th Generation wRfx Non Reactive Non Reactive    Comment: Performed at [facility], [address], [place], [place] [address]  Basic metabolic panel      Status: Abnormal   Collection Time: 06/06/20  4:30 AM  Result Value Ref Range   Sodium 141 135 - 145 mmol/L   Potassium 3.7 3.5 - 5.1 mmol/L   Chloride 104 98 - 111 mmol/L   CO2 33 (H) 22 - 32 mmol/L   Glucose, Bld 172 (H) 70 - 99 mg/dL    Comment: Glucose reference range applies only to samples taken after fasting for at least 8 hours.   BUN 21 8 - 23 mg/dL   Creatinine, Ser 1.17 0.61 - 1.24 mg/dL   Calcium 10.2 8.9 - 10.3 mg/dL   GFR calc non Af Amer >60 >60 mL/min   GFR calc Af Amer >60 >60 mL/min   Anion gap 4 (L) 5 - 15    Comment: Performed at [facility], [address] [place], [place] [address]  CBC     Status: Abnormal   Collection Time: 06/06/20  4:30 AM  Result Value Ref Range   WBC 4.9 4.0 - 10.5 K/uL   RBC 4.99 4.22 - 5.81 MIL/uL   Hemoglobin 12.6 (L) 13.0 - 17.0 g/dL   HCT 39.6 39 - 52 %   MCV 79.4 (L) 80.0 - 100.0 fL   MCH 25.3 (L) 26.0 - 34.0 pg   MCHC 31.8 30.0 - 36.0 g/dL   RDW 17.2 (H) 11.5 - 15.5 %   Platelets 97 (L) 150 - 400 K/uL    Comment: Immature Platelet Fraction may be clinically indicated, consider ordering this additional test [id number]    nRBC 0.0 0.0 - 0.2 %    Comment: Performed at [facility], [address] [place], [place] [address]  Glucose, capillary     Status: Abnormal   Collection Time: 06/06/20  8:07 AM  Result Value Ref Range   Glucose-Capillary 158 (H) 70 - 99 mg/dL    Comment: Glucose reference range applies only to samples taken after fasting for at least 8 hours.   Comment 1 Notify RN   Glucose, capillary     Status: Abnormal   Collection Time: 06/06/20 11:43 AM  Result Value Ref Range   Glucose-Capillary 164 (H) 70 - 99 mg/dL    Comment: Glucose reference range applies only to samples taken after fasting for at least 8 hours.   Comment 1 Notify RN   ECHOCARDIOGRAM COMPLETE     Status: None   Collection Time: 06/06/20  1:13 PM  Result Value Ref Range   Weight 4,208 oz   Height 70 in   BP 153/71  mmHg  Glucose, capillary     Status: Abnormal   Collection Time: 06/06/20  3:33 PM  Result Value Ref Range   Glucose-Capillary 197 (H) 70 - 99 mg/dL  Comment: Glucose reference range applies only to samples taken after fasting for at least 8 hours.   Comment 1 Notify Soil scientist Metabolic Panel (BMET)     Status: Abnormal   Collection Time: 06/23/20  1:14 PM  Result Value Ref Range   Sodium 140 135 - 145 mmol/L   Potassium 4.6 3.5 - 5.1 mmol/L   Chloride 101 98 - 111 mmol/L   CO2 31 22 - 32 mmol/L   Glucose, Bld 171 (H) 70 - 99 mg/dL    Comment: Glucose reference range applies only to samples taken after fasting for at least 8 hours.   BUN 23 8 - 23 mg/dL   Creatinine, Ser 1.27 (H) 0.61 - 1.24 mg/dL   Calcium 11.1 (H) 8.9 - 10.3 mg/dL   GFR calc non Af Amer 57 (L) >60 mL/min   GFR calc Af Amer >60 >60 mL/min   Anion gap 8 5 - 15    Comment: Performed at [facility], [address] [place], [place] [address]  Calcium, urine, 24 hour     Status: Abnormal   Collection Time: 06/24/20  1:14 PM  Result Value Ref Range   Calcium, Ur 17.4 Not Estab. mg/dL   Calcium, 24 hour urine 383 (H) 47 - 462 mg/24 hr    Comment: (NOTE) Performed At: [facility] [place], [place] [id number] Rush Farmer MD [id number]    Total Volume 2,200     Comment: Performed at [facility], [address] [place], [place] [address]  Creatinine, urine, 24 hour     Status: None   Collection Time: 06/24/20  1:14 PM  Result Value Ref Range   Urine Total Volume-UCRE24 2,200 mL   Collection Interval-UCRE24 24 hours   Creatinine, Urine 73 mg/dL   Creatinine, 24H Ur 1,606 800 - 2,000 mg/day    Comment: Performed at [facility], [address], [place], [place] [address]  Basic metabolic panel     Status: Abnormal   Collection Time: 07/02/20 11:44 AM  Result Value Ref Range   Glucose 154 (H) 65 - 99 mg/dL   BUN 27 8 - 27 mg/dL   Creatinine, Ser 1.30  (H) 0.76 - 1.27 mg/dL   GFR calc non Af Amer 55 (L) >59 mL/min/1.73   GFR calc Af Amer 64 >59 mL/min/1.73    Comment: **[facility] currently reports eGFR in compliance with the current**   recommendations of the [employer]. [facility] will   update reporting as new guidelines are published from the NKF-ASN   Task force.    BUN/Creatinine Ratio 21 10 - 24   Sodium 143 134 - 144 mmol/L   Potassium 4.8 3.5 - 5.2 mmol/L   Chloride 102 96 - 106 mmol/L   CO2 29 20 - 29 mmol/L   Calcium 11.9 (H) 8.6 - 10.2 mg/dL  CBC w/Diff     Status: Abnormal   Collection Time: 07/02/20 11:44 AM  Result Value Ref Range   WBC 7.4 3.4 - 10.8 x10E3/uL   RBC 5.73 4.14 - 5.80 x10E6/uL   Hemoglobin 14.0 13.0 - 17.7 g/dL   Hematocrit 45.5 37.5 - 51.0 %   MCV 79 79 - 97 fL   MCH 24.4 (L) 26.6 - 33.0 pg   MCHC 30.8 (L) 31 - 35 g/dL   RDW 16.0 (H) 11.6 - 15.4 %   Platelets 120 (L) 150 - 450 x10E3/uL   Neutrophils 61 Not Estab. %  Lymphs 27 Not Estab. %   Monocytes 9 Not Estab. %   Eos 3 Not Estab. %   Basos 0 Not Estab. %   Neutrophils Absolute 4.5 1 - 7 x10E3/uL   Lymphocytes Absolute 2.0 0 - 3 x10E3/uL   Monocytes Absolute 0.7 0 - 0 x10E3/uL   EOS (ABSOLUTE) 0.2 0.0 - 0.4 x10E3/uL   Basophils Absolute 0.0 0 - 0 x10E3/uL   Immature Granulocytes 0 Not Estab. %   Immature Grans (Abs) 0.0 0.0 - 0.1 x10E3/uL    Objective: General: Patient is awake, alert, and oriented x 3 and in no acute distress.  Integument: Skin is warm, dry and supple bilateral.  Nails are short and thick, consistent with onychomycosis, 1-5 bilateral. No signs of infection.Healed scabs on shins bilateral. No open lesions or preulcerative lesions present bilateral. Remaining integument unremarkable.  Vasculature:  Dorsalis Pedis pulse 1/4 bilateral. Posterior Tibial pulse  0/4 bilateral. Capillary fill time <3 sec 1-5 bilateral. No hair growth to the level of the digits.Temperature gradient within normal limits. No  varicosities present bilateral. 1+ pitting edema present bilateral. +Stucco dermatitis due to PVD without infection.   Neurology: Protective sensation absent with Thornell Mule monofilament to all pedal sites bilateral.  Musculoskeletal: Mild pain with palpation to right lateral ankle and no significant pain at left first toe. No tenderness with calf compression bilateral.  Xrays: As below  Assessment and Plan: Problem List Items Addressed This Visit    None    Visit Diagnoses    Closed displaced fracture of distal phalanx of left great toe with routine healing, subsequent encounter    -  Primary   Relevant Orders   DG Foot Complete Left (Completed)   Diabetic polyneuropathy associated with type 2 diabetes mellitus (HCC)       PAD (peripheral artery disease) (HCC)       Right ankle pain, unspecified chronicity       PVD (peripheral vascular disease) ([place])         -Examined patient. -Discussed continued care for prematurely healed left hallux fracture with mild comminution and evidence of osteophyte healing however the proximal phalanx is slightly rotated on the sagittal view but fracture is still well opposed for healing with no symptoms at ipj -Recommend slowly to transition to normal shoe on left -Recommend surigrip ankle support on left and right, if continues to be painful at right ankle may need xray -May take tylenol PRN pain -Answered all patient questions -Patient to return in 4 weeks for diabetic nail trim and xrays of right ankle if still painful -Patient advised to call the office if any problems or questions arise in the meantime.  Landis Martins, DPM

## 2020-07-22 NOTE — Patient Instructions (Addendum)
Your procedure is scheduled on: 07-25-20 FRIDAY Report to Same Day Surgery 2nd floor medical mall [facility] Entrance-take elevator on left to 2nd floor.  Check in with surgery information desk.) To find out your arrival time please call [phone] between 1PM - 3PM on 07-24-20 THURSDAY  Remember: Instructions that are not followed completely may result in serious medical risk, up to and including death, or upon the discretion of your surgeon and anesthesiologist your surgery may need to be rescheduled.    _x___ 1. Do not eat food after midnight the night before your procedure. NO GUM OR CANDY AFTER MIDNIGHT. You may drink WATER up to 2 hours before you are scheduled to arrive at the hospital for your procedure.  Do not drink WATER within 2 hours of your scheduled arrival to the hospital.  Type 1 and type 2 diabetics should only drink water.     __x__ 2. No Alcohol for 24 hours before or after surgery.   __x__3. No Smoking or e-cigarettes for 24 prior to surgery.  Do not use any chewable tobacco products for at least 6 hour prior to surgery   ____  4. Bring all medications with you on the day of surgery if instructed.    __x__ 5. Notify your doctor if there is any change in your medical condition     (cold, fever, infections).    x___6. On the morning of surgery brush your teeth with toothpaste and water.  You may rinse your mouth with mouth wash if you wish.  Do not swallow any toothpaste or mouthwash.   Do not wear jewelry, make-up, hairpins, clips or nail polish.  Do not wear lotions, powders, or perfumes.   Do not shave 48 hours prior to surgery. Men may shave face and neck.  Do not bring valuables to the hospital.    [facility] is not responsible for any belongings or valuables.               Contacts, dentures or bridgework may not be worn into surgery.  Leave your suitcase in the car. After surgery it may be brought to your room.  For patients admitted to the hospital,  discharge time is determined by your  treatment team.  _  Patients discharged the day of surgery will not be allowed to drive home.  You will need someone to drive you home and stay with you the night of your procedure.    Please read over the following fact sheets that you were given:   [facility] Preparing for Surgery   _x___ TAKE THE FOLLOWING MEDICATION THE MORNING OF SURGERY WITH A SMALL SIP OF WATER. These include:  1. AMIODARONE (PACERONE)  2. FLEXERIL (CYCLOBENAZEPRINE)  3. AVODART (DUTASTERIDE)  4. NEXIUM (ESOMEPRAZOLE)  5. ALLEGRA (FEXOFENADINE)  6. MAGNESIUM OXIDE   7. METOPROLOL (LOPRESSOR)   8. OXYCONTIN   9. REQUIP (ROPINIROLE)  10.LYRICA (PREGABALIN)  11.GABAPENTIN (NEURONTIN)  ____Fleets enema or Magnesium Citrate as directed.   _x___ Use CHG Soap as directed on instruction sheet   ____ Use inhalers on the day of surgery and bring to hospital day of surgery  ____ Stop Metformin and Janumet 2 days prior to surgery.    _X___ Take 1/2 of usual insulin dose the night before surgery and none on the morning surgery.-TAKE HALF OF YOUR LANTUS (19 UNITS) THURSDAY NIGHT AND NO INSULIN THE MORNING OF YOUR SURGERY  _x___ Follow recommendations from Cardiologist, Pulmonologist or PCP regarding stopping Aspirin, Coumadin,  Plavix ,Eliquis, Effient, or Pradaxa, and Pletal-INSTRUCTED TO STOP 3 DAYS PRIOR TO SURGERY-LAST DOSE OF ELIQUIS WAS 07-22-20 AM DOSE ONLY  X____Stop Anti-inflammatories such as Advil, Aleve, Ibuprofen, Motrin, Naproxen, Naprosyn, Goodies powders or aspirin products NOW-OK to take Tylenol    _x___ Stop supplements until after surgery-STOP [place]   _X___ Bring BI-PAP to the hospital.

## 2020-07-23 ENCOUNTER — Other Ambulatory Visit: Admission: RE | Admit: 2020-07-23 | Payer: Medicare Other | Source: Ambulatory Visit

## 2020-07-23 ENCOUNTER — Encounter
Admission: RE | Admit: 2020-07-23 | Discharge: 2020-07-23 | Disposition: A | Discharge status: Home / Self Care | Payer: Medicare Other | Source: Ambulatory Visit | Attending: General Surgery | Admitting: General Surgery

## 2020-07-23 DIAGNOSIS — Z86711 Personal history of pulmonary embolism: Secondary | ICD-10-CM | POA: Insufficient documentation

## 2020-07-23 DIAGNOSIS — I4891 Unspecified atrial fibrillation: Secondary | ICD-10-CM | POA: Insufficient documentation

## 2020-07-23 DIAGNOSIS — H5461 Unqualified visual loss, right eye, normal vision left eye: Secondary | ICD-10-CM | POA: Insufficient documentation

## 2020-07-23 DIAGNOSIS — G4733 Obstructive sleep apnea (adult) (pediatric): Secondary | ICD-10-CM | POA: Insufficient documentation

## 2020-07-23 DIAGNOSIS — Z794 Long term (current) use of insulin: Secondary | ICD-10-CM | POA: Insufficient documentation

## 2020-07-23 DIAGNOSIS — K219 Gastro-esophageal reflux disease without esophagitis: Secondary | ICD-10-CM | POA: Insufficient documentation

## 2020-07-23 DIAGNOSIS — Z01812 Encounter for preprocedural laboratory examination: Secondary | ICD-10-CM | POA: Insufficient documentation

## 2020-07-23 DIAGNOSIS — M1990 Unspecified osteoarthritis, unspecified site: Secondary | ICD-10-CM | POA: Insufficient documentation

## 2020-07-23 DIAGNOSIS — Z888 Allergy status to other drugs, medicaments and biological substances status: Secondary | ICD-10-CM | POA: Insufficient documentation

## 2020-07-23 DIAGNOSIS — Z9049 Acquired absence of other specified parts of digestive tract: Secondary | ICD-10-CM | POA: Insufficient documentation

## 2020-07-23 DIAGNOSIS — E7800 Pure hypercholesterolemia, unspecified: Secondary | ICD-10-CM | POA: Insufficient documentation

## 2020-07-23 DIAGNOSIS — E669 Obesity, unspecified: Secondary | ICD-10-CM | POA: Insufficient documentation

## 2020-07-23 DIAGNOSIS — N400 Enlarged prostate without lower urinary tract symptoms: Secondary | ICD-10-CM | POA: Insufficient documentation

## 2020-07-23 DIAGNOSIS — Z9989 Dependence on other enabling machines and devices: Secondary | ICD-10-CM | POA: Insufficient documentation

## 2020-07-23 DIAGNOSIS — Z86718 Personal history of other venous thrombosis and embolism: Secondary | ICD-10-CM | POA: Insufficient documentation

## 2020-07-23 DIAGNOSIS — I2510 Atherosclerotic heart disease of native coronary artery without angina pectoris: Secondary | ICD-10-CM | POA: Insufficient documentation

## 2020-07-23 DIAGNOSIS — Z96653 Presence of artificial knee joint, bilateral: Secondary | ICD-10-CM | POA: Insufficient documentation

## 2020-07-23 DIAGNOSIS — Z8673 Personal history of transient ischemic attack (TIA), and cerebral infarction without residual deficits: Secondary | ICD-10-CM | POA: Insufficient documentation

## 2020-07-23 DIAGNOSIS — I110 Hypertensive heart disease with heart failure: Secondary | ICD-10-CM | POA: Insufficient documentation

## 2020-07-23 DIAGNOSIS — E1142 Type 2 diabetes mellitus with diabetic polyneuropathy: Secondary | ICD-10-CM | POA: Insufficient documentation

## 2020-07-23 DIAGNOSIS — Z79891 Long term (current) use of opiate analgesic: Secondary | ICD-10-CM | POA: Insufficient documentation

## 2020-07-23 DIAGNOSIS — Z7901 Long term (current) use of anticoagulants: Secondary | ICD-10-CM | POA: Insufficient documentation

## 2020-07-23 DIAGNOSIS — I5032 Chronic diastolic (congestive) heart failure: Secondary | ICD-10-CM | POA: Insufficient documentation

## 2020-07-23 DIAGNOSIS — Z8249 Family history of ischemic heart disease and other diseases of the circulatory system: Secondary | ICD-10-CM | POA: Insufficient documentation

## 2020-07-23 DIAGNOSIS — I4892 Unspecified atrial flutter: Secondary | ICD-10-CM | POA: Insufficient documentation

## 2020-07-23 DIAGNOSIS — E785 Hyperlipidemia, unspecified: Secondary | ICD-10-CM | POA: Insufficient documentation

## 2020-07-23 DIAGNOSIS — Z8701 Personal history of pneumonia (recurrent): Secondary | ICD-10-CM | POA: Insufficient documentation

## 2020-07-23 DIAGNOSIS — Z79899 Other long term (current) drug therapy: Secondary | ICD-10-CM | POA: Insufficient documentation

## 2020-07-23 DIAGNOSIS — Z20822 Contact with and (suspected) exposure to covid-19: Secondary | ICD-10-CM | POA: Insufficient documentation

## 2020-07-23 DIAGNOSIS — Z87891 Personal history of nicotine dependence: Secondary | ICD-10-CM | POA: Insufficient documentation

## 2020-07-23 LAB — SARS CORONAVIRUS 2 (TAT 6-24 HRS): SARS Coronavirus 2: NEGATIVE

## 2020-07-23 NOTE — Consult Note (Signed)
[facility] Anesthesia Consultation  [name] EVO:[id number] DOB: Dec 30, 1950 DOA: 07/23/2020 PCP: Jerrol Banana., MD   Requesting physician: Dr. Celine Ahr Date of consultation: 07/23/20 Reason for consultation: Pre-op evaluation and clearance prior to parathyroidectomy  CHIEF COMPLAINT:  Medically complex patient, including atrial flutter, recent hospitalization, limited neck ROM, and chronic opioid therapy.  HISTORY OF PRESENT ILLNESS: Jerry Parsons  is a 69 y.o. male with a known history of A. 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    He also had a recent hospital admission for an episode of decreased consciousness, noted to have recurrence of rate controlled A. Flutter with variable AV block at that time.  Etiology was thought to be most likely related to exhaustion.  CT head and MRI brain were normal.  Echo showed EF 60-65%, mild LVH, no RWMA, moderately dilated LA, normal RVSF, normal PASP, and no significant valvular abnormalities.  EKG showed rate controlled A. Flutter.  Troponin negative x 2.  He was started on Eliquis, and continued on metoprolol and amiodarone.  Pt has been doing well since hospital discharge, denying chest pain, shortness of breath, dizziness, palpitations.  He continues on amiodarone and metoprolol.  He has been seen by Cardiology - please see their note dated 07/02/20.  Per cardiology, A Flutter may be from his endocrine disease.  They opted to defer cardioversion until after surgery as he is  asymptomatic and he would require four weeks of uninterrupted anticoagulation prior to cardioversion which would result in a delay of his surgery.    Pt reports nine previous back and neck surgeries and chronic pain.  He takes 40 mg oxycodone q8 hours.  PAST MEDICAL HISTORY:   Past Medical History:  Diagnosis Date  . Arrhythmia    tachycardia, A-Fib  . Arthritis   . Blind right eye   . BPH (benign prostatic hyperplasia)   . Diabetes mellitus   . DVT (deep venous thrombosis) ([place]) 02/2010   leg thrombus ; dislodged into emboli and caused PE  . Dyspnea   . Dysrhythmia   . 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 GERD (gastroesophageal reflux disease)   . Headache    h/o as a child  . Hypercholesteremia   . Hypertension   . Kidney failure    acute  . Neuromuscular disorder ([place])   . Neuropathy   . Pneumonia    time 9 ;last episode 12/2015  . Pulmonary embolus ([place]) 2011  . Seasonal allergies   . Seizures ([place])    as child   . Sleep apnea    BIPAP  . Stiff neck    limited turning s/p titanium plate placement  . TIA (transient ischemic attack)   . Wears dentures    full upper and lower    PAST SURGICAL HISTORY:  Past Surgical History:  Procedure Laterality Date  . BACK SURGERY     x 8; upper x 3 & lower x 5  . CARDIOVERSION  03/14/13, 10/16   2014 - [place], 2016 - [place]  . CATARACT EXTRACTION W/PHACO Left 10/29/2015   Procedure: CATARACT EXTRACTION PHACO AND  INTRAOCULAR LENS PLACEMENT (IOC);  Surgeon: Leandrew Koyanagi, MD;  Location: [place];  Service: Ophthalmology;  Laterality: Left;  DIABETIC - insulin and oral medsSleep apnea - no machine  . CHOLECYSTECTOMY    . COLONOSCOPY WITH PROPOFOL N/A 01/16/2018   Procedure: COLONOSCOPY WITH PROPOFOL;  Surgeon: Manya Silvas, MD;  Location[facility] ENDOSCOPY;  Service: Endoscopy;  Laterality: N/A;  . ESOPHAGOGASTRODUODENOSCOPY (EGD) WITH PROPOFOL N/A 01/16/2018   Procedure: ESOPHAGOGASTRODUODENOSCOPY  (EGD) WITH PROPOFOL;  Surgeon: Manya Silvas, MD;  Location: [facility] ENDOSCOPY;  Service: Endoscopy;  Laterality: N/A;  . EYE SURGERY    . GALLBLADDER SURGERY  2002  . JOINT REPLACEMENT Right 2018  . KNEE ARTHROSCOPY     left   . ROTATOR CUFF REPAIR  2001   left   . TEE WITHOUT CARDIOVERSION N/A 04/26/2018   Procedure: TRANSESOPHAGEAL ECHOCARDIOGRAM (TEE);  Surgeon: Nelva Bush, MD;  Location: [facility] ORS;  Service: Cardiovascular;  Laterality: N/A;  . TONSILLECTOMY    . TOTAL KNEE ARTHROPLASTY Right 06/20/2017   Procedure: RIGHT TOTAL KNEE ARTHROPLASTY;  Surgeon: Gaynelle Arabian, MD;  Location: [facility] ORS;  Service: Orthopedics;  Laterality: Right;    SOCIAL HISTORY:  Social History   Tobacco Use  . Smoking status: Former Smoker    Packs/day: 2.00    Years: 20.00    Pack years: 40.00    Types: Cigarettes    Quit date: 12/26/1989    Years since quitting: 30.5  . Smokeless tobacco: Never Used  Substance Use Topics  . Alcohol use: No    FAMILY HISTORY:  Family History  Problem Relation Age of Onset  . Heart attack Brother     DRUG ALLERGIES:  Allergies  Allergen Reactions  . Carisoprodol Itching  . Tamsulosin     Pt stated, "upsets my stomach"    REVIEW OF SYSTEMS:   10/14 systems negative except as noted elsewhere.  MEDICATIONS AT HOME:  Prior to Admission medications   Medication Sig Start Date End Date Taking? Authorizing Provider  amiodarone (PACERONE) 200 MG tablet TAKE 1 TABLET BY MOUTH EVERY DAY Patient taking differently: Take 200 mg by mouth every morning.  05/19/20   Gollan, Kathlene November, MD  apixaban (ELIQUIS) 5 MG TABS tablet Take 1 tablet (5 mg total) by mouth 2 (two) times daily. 06/18/20 07/22/20  Jerrol Banana., MD  cholecalciferol (VITAMIN D3) 25 MCG (1000 UT) tablet Take 1,000 Units by mouth daily.    [provider]  cyclobenzaprine (FLEXERIL) 10 MG tablet TAKE 1 TABLET BY MOUTH 3 TIMES A DAY Patient taking differently: Take 10 mg by  mouth 3 (three) times daily.  07/14/19   Jerrol Banana., MD  docusate sodium (COLACE) 250 MG capsule Take 250 mg by mouth daily as needed for constipation.     [provider]  dutasteride (AVODART) 0.5 MG capsule TAKE 1 CAPSULE DAILY Patient taking differently: Take 0.5 mg by mouth every morning.  07/13/20   Jerrol Banana., MD  esomeprazole (NEXIUM) 40 MG capsule Take 1 capsule (40 mg total) by mouth 2 (two) times daily. 01/31/20   Jerrol Banana., MD  fexofenadine (ALLEGRA) 180 MG tablet Take 180 mg by mouth every morning.     [provider]  furosemide (LASIX) 40 MG tablet Take 40 mg by mouth every morning.     [provider]  gabapentin (NEURONTIN) 100 MG capsule TAKE 1 CAPSULE BY MOUTH TWICE A DAY Patient taking differently: Take 100  mg by mouth 2 (two) times daily.  06/19/20   Jerrol Banana., MD  hydrochlorothiazide (HYDRODIURIL) 12.5 MG tablet TAKE 2 TABLETS (25 MG TOTAL) BY MOUTH AT BEDTIME. 03/08/20   Jerrol Banana., MD  Insulin Pen Needle (PEN NEEDLES) 31G X 5 MM MISC 1 each by Does not apply route daily. PATIENT NEEDS NOVA TWIST NEEDLES 5 MM  DX E11.9 10/18/16   Jerrol Banana., MD  JANUVIA 100 MG tablet TAKE 1 TABLET DAILY Patient taking differently: Take 100 mg by mouth daily.  09/26/19   Jerrol Banana., MD  LANTUS SOLOSTAR 100 UNIT/ML Solostar Pen Inject 38 Units into the skin 2 (two) times daily. 06/06/20   Jennye Boroughs, MD  liraglutide (VICTOZA) 18 MG/3ML SOPN INJECT 0.3ML (=1.8MG )      SUBCUTANEOUSLY DAILY Patient taking differently: Inject 1.8 mg into the skin daily.  11/19/19   Jerrol Banana., MD  magnesium oxide (MAG-OX) 400 MG tablet TAKE 1 TABLET (400 MG TOTAL) BY MOUTH 2 (TWO) TIMES DAILY. Patient taking differently: Take 400 mg by mouth 2 (two) times daily.  05/19/20   Jerrol Banana., MD  Melatonin Gummies 2.5 MG CHEW Chew 5 mg by mouth at bedtime.     [provider]   metoprolol tartrate (LOPRESSOR) 100 MG tablet TAKE 1 TABLET (100 MG TOTAL) BY MOUTH 2 (TWO) TIMES DAILY. 02/26/20   Jerrol Banana., MD  naloxone [facility]) 0.4 MG/ML injection INJECT AS NEEDED FOR SUSPECTED OVERDOSE. 06/12/20   [provider]  Naloxone HCl 0.4 MG/0.4ML SOAJ Inject as needed for suspected overdose. 06/12/20   Jerrol Banana., MD  NOVOLOG FLEXPEN 100 UNIT/ML FlexPen INJECT 12 UNITS            SUBCUTANEOUSLY 3 TIMES A   DAY WITH MEALS Patient taking differently: Inject 14 Units into the skin in the morning and at bedtime.  06/13/20   Jerrol Banana., MD  NOVOTWIST 32G X 5 MM MISC USE 6 TIMES A DAY DX E11.9 01/05/20   Jerrol Banana., MD  Omega-3 Fatty Acids (FISH OIL) 1200 MG CAPS Take 1,200 mg by mouth 2 (two) times daily.     [provider]  [facility] ULTRA test strip USE WITH METER TWICE DAILY 07/01/20   Jerrol Banana., MD  oxyCODONE (OXYCONTIN) 40 mg 12 hr tablet Take 1 tablet (40 mg total) by mouth every 8 (eight) hours as needed. Patient taking differently: Take 40 mg by mouth every 8 (eight) hours.  05/12/20   Jerrol Banana., MD  pregabalin (LYRICA) 200 MG capsule TAKE 1 CAPSULE 3 TIMES A   DAY Patient taking differently: Take 200 mg by mouth 3 (three) times daily.  11/07/19   Jerrol Banana., MD  rOPINIRole (REQUIP) 1 MG tablet TAKE 1 TO 2 TABLETS BY MOUTH AT BEDTIME AS NEEDED Patient taking differently: Take 1 mg by mouth in the morning.  06/23/20   Jerrol Banana., MD  rOPINIRole (REQUIP) 2 MG tablet Take 1 tablet twice a day as needed. Patient taking differently: Take 2 mg by mouth at bedtime.  03/10/20   Jerrol Banana., MD  sucralfate (CARAFATE) 1 g tablet TAKE 1 TABLET BY MOUTH 4 (FOUR) TIMES DAILY - WITH MEALS AND AT BEDTIME. Patient taking differently: Take 2 g by mouth 2 (two) times daily.  04/29/20   Jerrol Banana., MD  tamsulosin [facility])  0.4 MG CAPS capsule Take 0.4 mg by mouth daily.  07/09/20   [provider]  testosterone cypionate (DEPOTESTOSTERONE CYPIONATE) 200 MG/ML injection Inject 200 mg into the muscle every 21 ( twenty-one) days.  05/02/18   [provider]      PHYSICAL EXAMINATION:   VITAL SIGNS:  T 97.4 BP 114/71 HR 70 SpO2 94%  GENERAL:  69 y.o.-year-old patient lying in the bed with no acute distress.  AIRWAY: MP 2.  TM>3 cm.  Upper and lower dentures.  Unable to bite upper lip.  Markedly limited neck ROM. HEENT: Head atraumatic NECK:  Markedly limited neck range of motion LUNGS: Normal breath sounds bilaterally. No use of accessory muscles of respiration.  CARDIOVASCULAR: S1, S2 normal. No murmurs, rubs, or gallops noted.  ABDOMEN: abdominal obesity EXTREMITIES: Trace BLE edema PSYCHIATRIC: alert and oriented x 3.   LABORATORY PANEL:     ----------------------------------------------------------------------  Urinalysis    Component Value Date/Time   COLORURINE YELLOW (A) 04/22/2018 0255   APPEARANCEUR CLEAR (A) 04/22/2018 0255   APPEARANCEUR Clear 04/12/2013 1256   LABSPEC 1.016 04/22/2018 0255   LABSPEC 1.015 04/12/2013 1256   PHURINE 5.0 04/22/2018 0255   GLUCOSEU >=500 (A) 04/22/2018 0255   GLUCOSEU >=500 04/12/2013 1256   HGBUR NEGATIVE 04/22/2018 0255   BILIRUBINUR NEGATIVE 04/22/2018 0255   BILIRUBINUR neg 07/13/2017 1144   BILIRUBINUR Negative 04/12/2013 1256   KETONESUR 5 (A) 04/22/2018 0255   PROTEINUR NEGATIVE 04/22/2018 0255   UROBILINOGEN 0.2 07/13/2017 1144   UROBILINOGEN 0.2 02/05/2010 1041   NITRITE NEGATIVE 04/22/2018 0255   LEUKOCYTESUR NEGATIVE 04/22/2018 0255   LEUKOCYTESUR Negative 04/12/2013 1256     RADIOLOGY: DG Foot Complete Left  Result Date: 07/22/2020 Please see detailed radiograph report in office note.   EKG: Orders placed or performed in visit on 07/02/20  . EKG 12-Lead    IMPRESSION AND RECOMMENDATIONS:  69 yo M with multiple comorbidities.  Atrial Flutter: Pt is  asymptomatic.  Cardiac clearance has been provided.  He is rate controlled today.  As long as he is rate controlled on day of surgery, ok to proceed with surgery.  Holding Eliquis 2-3 days prior to surgery.   HFpEF: well compensated.  High risk of volume overload while in A Flutter.  Perioperative fluids should be minimized.   Limited neck ROM: Reassuring airway exam with the exception of neck ROM.  Recommend video laryngoscopy to minimize cervical extension during laryngoscopy.  Chronic opioid use: Continue home opioid therapy perioperatively with supplementation as needed related to parathyroid surgery.  Maximize multimodals as able.   Management plans discussed with the patient and his wife, and they are in agreement.  Patient was verbally consented for GETA today.   CODE STATUS: Code Status History    Date Active Date Inactive Code Status Order ID Comments User Context   06/05/2020 1950 06/06/2020 2302 Full Code [id number]  Jennye Boroughs, MD ED   04/22/2018 0535 04/28/2018 2259 Full Code [id number]  Arta Silence, MD Inpatient   06/20/2017 1035 06/22/2017 1801 Full Code [id number]  Gaynelle Arabian, MD Inpatient   05/08/2017 0002 05/10/2017 1512 Full Code [id number]  Harvie Bridge, DO Inpatient   09/08/2015 2107 09/09/2015 1804 Full Code [id number]  Demetrios Loll, MD Inpatient   Advance Care Planning Activity    Questions for Most Recent Historical Code Status (Order [id number])        TOTAL TIME TAKING CARE OF THIS PATIENT: 25 minutes.    [name] M.D on  07/23/2020 at 1:09 PM  [email or web address] - [place]

## 2020-07-24 ENCOUNTER — Encounter
Admission: RE | Admit: 2020-07-24 | Discharge: 2020-07-24 | Disposition: A | Discharge status: Home / Self Care | Payer: Medicare Other | Source: Ambulatory Visit | Attending: General Surgery | Admitting: General Surgery

## 2020-07-24 ENCOUNTER — Other Ambulatory Visit: Payer: Self-pay

## 2020-07-24 DIAGNOSIS — E213 Hyperparathyroidism, unspecified: Secondary | ICD-10-CM | POA: Diagnosis not present

## 2020-07-24 MED ORDER — TECHNETIUM TC 99M SESTAMIBI - CARDIOLITE
25.0000 | Freq: Once | INTRAVENOUS | Status: AC | PRN
  Administered 2020-07-24: 11:00:00 24.831 via INTRAVENOUS

## 2020-07-25 ENCOUNTER — Encounter
Admission: RE | Disposition: A | Discharge status: Home / Self Care | Payer: Self-pay | Source: Ambulatory Visit | Attending: General Surgery

## 2020-07-25 ENCOUNTER — Observation Stay
Admission: RE | Admit: 2020-07-25 | Discharge: 2020-07-26 | Disposition: A | Discharge status: Home / Self Care | Payer: Medicare Other | Source: Ambulatory Visit | Attending: General Surgery | Admitting: General Surgery

## 2020-07-25 ENCOUNTER — Ambulatory Visit: Payer: Medicare Other | Admitting: Anesthesiology

## 2020-07-25 ENCOUNTER — Encounter: Payer: Self-pay | Admitting: General Surgery

## 2020-07-25 DIAGNOSIS — I110 Hypertensive heart disease with heart failure: Secondary | ICD-10-CM | POA: Diagnosis not present

## 2020-07-25 DIAGNOSIS — G4733 Obstructive sleep apnea (adult) (pediatric): Secondary | ICD-10-CM | POA: Diagnosis not present

## 2020-07-25 DIAGNOSIS — I10 Essential (primary) hypertension: Secondary | ICD-10-CM | POA: Insufficient documentation

## 2020-07-25 DIAGNOSIS — Z96651 Presence of right artificial knee joint: Secondary | ICD-10-CM | POA: Diagnosis not present

## 2020-07-25 DIAGNOSIS — I5032 Chronic diastolic (congestive) heart failure: Secondary | ICD-10-CM | POA: Diagnosis not present

## 2020-07-25 DIAGNOSIS — Z7901 Long term (current) use of anticoagulants: Secondary | ICD-10-CM | POA: Insufficient documentation

## 2020-07-25 DIAGNOSIS — Z79899 Other long term (current) drug therapy: Secondary | ICD-10-CM | POA: Diagnosis not present

## 2020-07-25 DIAGNOSIS — E119 Type 2 diabetes mellitus without complications: Secondary | ICD-10-CM | POA: Diagnosis not present

## 2020-07-25 DIAGNOSIS — Z87891 Personal history of nicotine dependence: Secondary | ICD-10-CM | POA: Diagnosis not present

## 2020-07-25 DIAGNOSIS — Z794 Long term (current) use of insulin: Secondary | ICD-10-CM | POA: Diagnosis not present

## 2020-07-25 DIAGNOSIS — E892 Postprocedural hypoparathyroidism: Secondary | ICD-10-CM

## 2020-07-25 DIAGNOSIS — K5900 Constipation, unspecified: Secondary | ICD-10-CM | POA: Diagnosis not present

## 2020-07-25 DIAGNOSIS — D351 Benign neoplasm of parathyroid gland: Secondary | ICD-10-CM | POA: Diagnosis not present

## 2020-07-25 DIAGNOSIS — E1140 Type 2 diabetes mellitus with diabetic neuropathy, unspecified: Secondary | ICD-10-CM | POA: Diagnosis not present

## 2020-07-25 DIAGNOSIS — E785 Hyperlipidemia, unspecified: Secondary | ICD-10-CM | POA: Diagnosis not present

## 2020-07-25 DIAGNOSIS — K219 Gastro-esophageal reflux disease without esophagitis: Secondary | ICD-10-CM | POA: Diagnosis not present

## 2020-07-25 DIAGNOSIS — E210 Primary hyperparathyroidism: Secondary | ICD-10-CM | POA: Diagnosis not present

## 2020-07-25 HISTORY — PX: PARATHYROIDECTOMY: SHX19

## 2020-07-25 LAB — PARATHYROID HORMONE, INTRAOP (ARMC ONLY)
Parathyroid Hormone: 115 pg/mL — ABNORMAL HIGH (ref 12–88)
Parathyroid Hormone: 124 pg/mL — ABNORMAL HIGH (ref 12–88)
Parathyroid Hormone: 837 pg/mL — ABNORMAL HIGH (ref 12–88)
Parathyroid Hormone: 14 pg/mL (ref 12–88)
Parathyroid Hormone: 21 pg/mL (ref 12–88)
Parathyroid Hormone: 35 pg/mL (ref 12–88)

## 2020-07-25 LAB — GLUCOSE, CAPILLARY
Glucose-Capillary: 125 mg/dL — ABNORMAL HIGH (ref 70–99)
Glucose-Capillary: 111 mg/dL — ABNORMAL HIGH (ref 70–99)
Glucose-Capillary: 160 mg/dL — ABNORMAL HIGH (ref 70–99)
Glucose-Capillary: 293 mg/dL — ABNORMAL HIGH (ref 70–99)
Glucose-Capillary: 219 mg/dL — ABNORMAL HIGH (ref 70–99)

## 2020-07-25 LAB — CALCIUM: Calcium: 10.3 mg/dL (ref 8.9–10.3)

## 2020-07-25 LAB — ALBUMIN: Albumin: 3.4 g/dL — ABNORMAL LOW (ref 3.5–5.0)

## 2020-07-25 SURGERY — PARATHYROIDECTOMY
Anesthesia: General

## 2020-07-25 MED ORDER — ACETAMINOPHEN 500 MG PO TABS
ORAL_TABLET | ORAL | Status: AC
  Administered 2020-07-25: 1000 mg via ORAL
  Filled 2020-07-25: qty 2

## 2020-07-25 MED ORDER — CALCITRIOL 0.25 MCG PO CAPS
0.2500 ug | ORAL_CAPSULE | Freq: Two times a day (BID) | ORAL | Status: DC
  Administered 2020-07-25 – 2020-07-26 (×2): 0.25 ug via ORAL
  Filled 2020-07-25 (×4): qty 1

## 2020-07-25 MED ORDER — LIDOCAINE HCL (CARDIAC) PF 100 MG/5ML IV SOSY
PREFILLED_SYRINGE | INTRAVENOUS | Status: DC | PRN
  Administered 2020-07-25: 100 mg via INTRAVENOUS

## 2020-07-25 MED ORDER — SUGAMMADEX SODIUM 200 MG/2ML IV SOLN
INTRAVENOUS | Status: DC | PRN
Start: 2020-07-25 — End: 2020-07-25
  Administered 2020-07-25: 200 mg via INTRAVENOUS

## 2020-07-25 MED ORDER — FENTANYL CITRATE (PF) 100 MCG/2ML IJ SOLN: 25.0000 ug | INTRAMUSCULAR | Status: DC | PRN

## 2020-07-25 MED ORDER — CHLORHEXIDINE GLUCONATE CLOTH 2 % EX PADS: 6.0000 | MEDICATED_PAD | Freq: Once | CUTANEOUS | Status: DC

## 2020-07-25 MED ORDER — OXYCODONE HCL 5 MG PO TABS: 5.0000 mg | ORAL_TABLET | ORAL | Status: DC | PRN

## 2020-07-25 MED ORDER — REMIFENTANIL HCL 1 MG IV SOLR
INTRAVENOUS | Status: DC | PRN
  Administered 2020-07-25: .1 ug/kg/min via INTRAVENOUS

## 2020-07-25 MED ORDER — OXYCODONE HCL ER 10 MG PO T12A
40.0000 mg | EXTENDED_RELEASE_TABLET | Freq: Three times a day (TID) | ORAL | Status: DC
  Administered 2020-07-25 – 2020-07-26 (×3): 40 mg via ORAL
  Filled 2020-07-25 (×3): qty 4

## 2020-07-25 MED ORDER — VITAMIN D 25 MCG (1000 UNIT) PO TABS
1000.0000 [IU] | ORAL_TABLET | Freq: Every day | ORAL | Status: DC
  Administered 2020-07-26: 1000 [IU] via ORAL
  Filled 2020-07-25: qty 1

## 2020-07-25 MED ORDER — OXYCODONE HCL 5 MG PO TABS: 10.0000 mg | ORAL_TABLET | Freq: Once | ORAL | Status: DC | PRN

## 2020-07-25 MED ORDER — HEMOSTATIC AGENTS (NO CHARGE) OPTIME
TOPICAL | Status: DC | PRN
  Administered 2020-07-25: 1 via TOPICAL

## 2020-07-25 MED ORDER — SODIUM CHLORIDE 0.9 % IV SOLN
INTRAVENOUS | Status: DC | PRN
  Administered 2020-07-25: 100 ug/min via INTRAVENOUS

## 2020-07-25 MED ORDER — SUCCINYLCHOLINE CHLORIDE 20 MG/ML IJ SOLN
INTRAMUSCULAR | Status: DC | PRN
  Administered 2020-07-25: 120 mg via INTRAVENOUS

## 2020-07-25 MED ORDER — PHENYLEPHRINE HCL (PRESSORS) 10 MG/ML IV SOLN
INTRAVENOUS | Status: DC | PRN
  Administered 2020-07-25: 50 ug via INTRAVENOUS
  Administered 2020-07-25 (×2): 100 ug via INTRAVENOUS
  Administered 2020-07-25: 50 ug via INTRAVENOUS
  Administered 2020-07-25: 100 ug via INTRAVENOUS
  Administered 2020-07-25: 200 ug via INTRAVENOUS

## 2020-07-25 MED ORDER — MIDAZOLAM HCL 2 MG/2ML IJ SOLN
INTRAMUSCULAR | Status: AC
  Filled 2020-07-25: qty 2

## 2020-07-25 MED ORDER — CYCLOBENZAPRINE HCL 10 MG PO TABS
10.0000 mg | ORAL_TABLET | Freq: Three times a day (TID) | ORAL | Status: DC
  Administered 2020-07-25 – 2020-07-26 (×3): 10 mg via ORAL
  Filled 2020-07-25 (×3): qty 1

## 2020-07-25 MED ORDER — HYDROCHLOROTHIAZIDE 25 MG PO TABS
25.0000 mg | ORAL_TABLET | Freq: Every day | ORAL | Status: DC
  Administered 2020-07-25: 25 mg via ORAL
  Filled 2020-07-25: qty 1

## 2020-07-25 MED ORDER — DUTASTERIDE 0.5 MG PO CAPS
0.5000 mg | ORAL_CAPSULE | ORAL | Status: DC
  Administered 2020-07-26: 0.5 mg via ORAL
  Filled 2020-07-25 (×2): qty 1

## 2020-07-25 MED ORDER — REMIFENTANIL HCL 1 MG IV SOLR
INTRAVENOUS | Status: AC
  Filled 2020-07-25: qty 2000

## 2020-07-25 MED ORDER — CHLORHEXIDINE GLUCONATE 0.12 % MT SOLN
OROMUCOSAL | Status: AC
  Administered 2020-07-25: 15 mL via OROMUCOSAL
  Filled 2020-07-25: qty 15

## 2020-07-25 MED ORDER — ONDANSETRON HCL 4 MG/2ML IJ SOLN: 4.0000 mg | Freq: Once | INTRAMUSCULAR | Status: DC | PRN

## 2020-07-25 MED ORDER — ROCURONIUM BROMIDE 100 MG/10ML IV SOLN
INTRAVENOUS | Status: DC | PRN
  Administered 2020-07-25: 30 mg via INTRAVENOUS

## 2020-07-25 MED ORDER — ROPINIROLE HCL 1 MG PO TABS
1.0000 mg | ORAL_TABLET | Freq: Every morning | ORAL | Status: DC
  Administered 2020-07-26: 1 mg via ORAL
  Filled 2020-07-25: qty 1

## 2020-07-25 MED ORDER — FUROSEMIDE 40 MG PO TABS
40.0000 mg | ORAL_TABLET | ORAL | Status: DC
  Administered 2020-07-26: 40 mg via ORAL
  Filled 2020-07-25: qty 1

## 2020-07-25 MED ORDER — SIMETHICONE 80 MG PO CHEW: 40.0000 mg | CHEWABLE_TABLET | Freq: Four times a day (QID) | ORAL | Status: DC | PRN

## 2020-07-25 MED ORDER — ACETAMINOPHEN 500 MG PO TABS: 1000.0000 mg | ORAL_TABLET | ORAL | Status: AC

## 2020-07-25 MED ORDER — DOCUSATE SODIUM 50 MG PO CAPS
250.0000 mg | ORAL_CAPSULE | Freq: Every day | ORAL | Status: DC | PRN
  Filled 2020-07-25: qty 1

## 2020-07-25 MED ORDER — AMIODARONE HCL 200 MG PO TABS
200.0000 mg | ORAL_TABLET | ORAL | Status: DC
  Administered 2020-07-26: 200 mg via ORAL
  Filled 2020-07-25: qty 1

## 2020-07-25 MED ORDER — INSULIN GLARGINE 100 UNIT/ML ~~LOC~~ SOLN
19.0000 [IU] | Freq: Two times a day (BID) | SUBCUTANEOUS | Status: DC
  Administered 2020-07-25 – 2020-07-26 (×2): 19 [IU] via SUBCUTANEOUS
  Filled 2020-07-25 (×4): qty 0.19

## 2020-07-25 MED ORDER — SODIUM CHLORIDE 0.9 % IV SOLN: INTRAVENOUS | Status: DC

## 2020-07-25 MED ORDER — SODIUM CHLORIDE 0.9 % IV SOLN
Freq: Once | INTRAVENOUS | Status: AC
  Administered 2020-07-25: 145 mL via INTRAVENOUS
  Filled 2020-07-25: qty 5

## 2020-07-25 MED ORDER — ROPINIROLE HCL 1 MG PO TABS
2.0000 mg | ORAL_TABLET | Freq: Every day | ORAL | Status: DC
  Administered 2020-07-25: 2 mg via ORAL
  Filled 2020-07-25: qty 2

## 2020-07-25 MED ORDER — ACETAMINOPHEN 500 MG PO TABS
1000.0000 mg | ORAL_TABLET | Freq: Four times a day (QID) | ORAL | Status: DC
  Administered 2020-07-25 – 2020-07-26 (×4): 1000 mg via ORAL
  Filled 2020-07-25 (×4): qty 2

## 2020-07-25 MED ORDER — LACTATED RINGERS IV SOLN: INTRAVENOUS | Status: DC

## 2020-07-25 MED ORDER — SUCRALFATE 1 G PO TABS
2.0000 g | ORAL_TABLET | Freq: Two times a day (BID) | ORAL | Status: DC
  Administered 2020-07-25 – 2020-07-26 (×2): 2 g via ORAL
  Filled 2020-07-25 (×2): qty 2

## 2020-07-25 MED ORDER — FENTANYL CITRATE (PF) 100 MCG/2ML IJ SOLN
INTRAMUSCULAR | Status: DC | PRN
  Administered 2020-07-25: 100 ug via INTRAVENOUS

## 2020-07-25 MED ORDER — ORAL CARE MOUTH RINSE: 15.0000 mL | Freq: Once | OROMUCOSAL | Status: AC

## 2020-07-25 MED ORDER — PROPOFOL 10 MG/ML IV BOLUS
INTRAVENOUS | Status: AC
  Filled 2020-07-25: qty 40

## 2020-07-25 MED ORDER — PROPOFOL 10 MG/ML IV BOLUS
INTRAVENOUS | Status: DC | PRN
  Administered 2020-07-25: 150 mg via INTRAVENOUS

## 2020-07-25 MED ORDER — GABAPENTIN 100 MG PO CAPS
100.0000 mg | ORAL_CAPSULE | Freq: Two times a day (BID) | ORAL | Status: DC
  Administered 2020-07-25 – 2020-07-26 (×2): 100 mg via ORAL
  Filled 2020-07-25 (×2): qty 1

## 2020-07-25 MED ORDER — ONDANSETRON HCL 4 MG/2ML IJ SOLN
INTRAMUSCULAR | Status: DC | PRN
  Administered 2020-07-25: 4 mg via INTRAVENOUS

## 2020-07-25 MED ORDER — MAGNESIUM OXIDE 400 (241.3 MG) MG PO TABS
400.0000 mg | ORAL_TABLET | Freq: Two times a day (BID) | ORAL | Status: DC
  Administered 2020-07-25 – 2020-07-26 (×2): 400 mg via ORAL
  Filled 2020-07-25 (×4): qty 1

## 2020-07-25 MED ORDER — FENTANYL CITRATE (PF) 100 MCG/2ML IJ SOLN
INTRAMUSCULAR | Status: AC
  Filled 2020-07-25: qty 2

## 2020-07-25 MED ORDER — MELATONIN 5 MG PO TABS
5.0000 mg | ORAL_TABLET | Freq: Every day | ORAL | Status: DC
  Administered 2020-07-25: 5 mg via ORAL
  Filled 2020-07-25: qty 1

## 2020-07-25 MED ORDER — CALCIUM CARBONATE ANTACID 500 MG PO CHEW
1000.0000 mg | CHEWABLE_TABLET | Freq: Three times a day (TID) | ORAL | Status: DC
  Administered 2020-07-25 – 2020-07-26 (×3): 1000 mg via ORAL
  Filled 2020-07-25 (×3): qty 5

## 2020-07-25 MED ORDER — LIDOCAINE HCL 4 % EX SOLN
CUTANEOUS | Status: DC | PRN
  Administered 2020-07-25: 3 mL via TOPICAL

## 2020-07-25 MED ORDER — FIBRIN SEALANT 2 ML SINGLE DOSE KIT
PACK | CUTANEOUS | Status: DC | PRN
  Administered 2020-07-25: 2 mL via TOPICAL

## 2020-07-25 MED ORDER — PANTOPRAZOLE SODIUM 40 MG PO TBEC
40.0000 mg | DELAYED_RELEASE_TABLET | Freq: Every day | ORAL | Status: DC
  Administered 2020-07-26: 40 mg via ORAL
  Filled 2020-07-25: qty 1

## 2020-07-25 MED ORDER — PREGABALIN 75 MG PO CAPS
200.0000 mg | ORAL_CAPSULE | Freq: Three times a day (TID) | ORAL | Status: DC
  Administered 2020-07-25 – 2020-07-26 (×3): 200 mg via ORAL
  Filled 2020-07-25 (×3): qty 1

## 2020-07-25 MED ORDER — METOPROLOL TARTRATE 50 MG PO TABS
100.0000 mg | ORAL_TABLET | Freq: Two times a day (BID) | ORAL | Status: DC
  Administered 2020-07-26: 100 mg via ORAL
  Filled 2020-07-25: qty 2

## 2020-07-25 MED ORDER — CALCIUM CARBONATE 1250 (500 CA) MG PO TABS
1000.0000 mg | ORAL_TABLET | Freq: Three times a day (TID) | ORAL | Status: DC
  Filled 2020-07-25: qty 2

## 2020-07-25 MED ORDER — ONDANSETRON HCL 4 MG/2ML IJ SOLN: 4.0000 mg | Freq: Four times a day (QID) | INTRAMUSCULAR | Status: DC | PRN

## 2020-07-25 MED ORDER — CHLORHEXIDINE GLUCONATE 0.12 % MT SOLN: 15.0000 mL | Freq: Once | OROMUCOSAL | Status: AC

## 2020-07-25 MED ORDER — ONDANSETRON 4 MG PO TBDP: 4.0000 mg | ORAL_TABLET | Freq: Four times a day (QID) | ORAL | Status: DC | PRN

## 2020-07-25 MED ORDER — INSULIN ASPART 100 UNIT/ML ~~LOC~~ SOLN
0.0000 [IU] | Freq: Three times a day (TID) | SUBCUTANEOUS | Status: DC
  Administered 2020-07-25: 2 [IU] via SUBCUTANEOUS
  Administered 2020-07-26 (×2): 7 [IU] via SUBCUTANEOUS
  Filled 2020-07-25 (×3): qty 1

## 2020-07-25 SURGICAL SUPPLY — 54 items
ADH SKN CLS APL DERMABOND .7 (GAUZE/BANDAGES/DRESSINGS) ×1
BACTOSHIELD CHG 4% 4OZ (MISCELLANEOUS) ×1
BASIN GRAD PLASTIC 32OZ STRL (MISCELLANEOUS) ×2 IMPLANT
BLADE SURG 15 STRL LF DISP TIS (BLADE) ×1 IMPLANT
BLADE SURG 15 STRL SS (BLADE) ×2
CANISTER SUCT 1200ML W/VALVE (MISCELLANEOUS) ×2 IMPLANT
CLIP VESOCCLUDE SM WIDE 6/CT (CLIP) ×2 IMPLANT
CNTNR SPEC 2.5X3XGRAD LEK (MISCELLANEOUS) ×1
CONT SPEC 4OZ STER OR WHT (MISCELLANEOUS) ×1
CONT SPEC 4OZ STRL OR WHT (MISCELLANEOUS) ×1
CONTAINER SPEC 2.5X3XGRAD LEK (MISCELLANEOUS) IMPLANT
COVER WAND RF STERILE (DRAPES) ×2 IMPLANT
DERMABOND ADVANCED (GAUZE/BANDAGES/DRESSINGS) ×1
DERMABOND ADVANCED .7 DNX12 (GAUZE/BANDAGES/DRESSINGS) ×1 IMPLANT
DRAPE MAG INST 16X20 L/F (DRAPES) ×2 IMPLANT
DRAPE THYROID T SHEET (DRAPES) ×2 IMPLANT
ELECT CAUTERY BLADE TIP 2.5 (TIP) ×2
ELECT LARYNGEAL DUAL CHAN (ELECTRODE) IMPLANT
ELECT NEEDLE 20X.3 GREEN (MISCELLANEOUS)
ELECT REM PT RETURN 9FT ADLT (ELECTROSURGICAL) ×2
ELECTRODE CAUTERY BLDE TIP 2.5 (TIP) ×1 IMPLANT
ELECTRODE NDL 20X.3 GREEN (MISCELLANEOUS) IMPLANT
ELECTRODE NEEDLE 20X.3 GREEN (MISCELLANEOUS) IMPLANT
ELECTRODE REM PT RTRN 9FT ADLT (ELECTROSURGICAL) ×1 IMPLANT
GAUZE 4X4 16PLY RFD (DISPOSABLE) IMPLANT
GLOVE BIO SURGEON STRL SZ 6.5 (GLOVE) ×5 IMPLANT
GLOVE INDICATOR 7.0 STRL GRN (GLOVE) ×5 IMPLANT
GOWN STRL REUS W/ TWL LRG LVL3 (GOWN DISPOSABLE) ×2 IMPLANT
GOWN STRL REUS W/TWL LRG LVL3 (GOWN DISPOSABLE) ×8
HEMOSTAT SNOW SURGICEL 2X4 (HEMOSTASIS) ×2 IMPLANT
KIT TURNOVER KIT A (KITS) ×2 IMPLANT
LABEL OR SOLS (LABEL) ×2 IMPLANT
NDL HYPO 25X1 1.5 SAFETY (NEEDLE) ×4 IMPLANT
NDL SAFETY ECLIPSE 18X1.5 (NEEDLE) ×4 IMPLANT
NEEDLE HYPO 18GX1.5 SHARP (NEEDLE) ×12
NEEDLE HYPO 25X1 1.5 SAFETY (NEEDLE) ×12 IMPLANT
NERVE STIMULATOR WAVEFORM 16S (NEUROSURGERY SUPPLIES)
NS IRRIG 500ML POUR BTL (IV SOLUTION) ×2 IMPLANT
PACK BASIN MINOR (MISCELLANEOUS) ×2 IMPLANT
PROBE NEUROSIGN BIPOL (MISCELLANEOUS) IMPLANT
PROBE NEUROSIGN BIPOLAR (MISCELLANEOUS)
SCRUB CHG 4% DYNA-HEX 4OZ (MISCELLANEOUS) ×1 IMPLANT
SHEARS HARMONIC 9CM CVD (BLADE) ×1 IMPLANT
SPONGE KITTNER 5P (MISCELLANEOUS) ×2 IMPLANT
STIMULATOR NERVE WAVEFORM 16S (NEUROSURGERY SUPPLIES) IMPLANT
STRIP CLOSURE SKIN 1/2X4 (GAUZE/BANDAGES/DRESSINGS) ×2 IMPLANT
SUT MNCRL AB 4-0 PS2 18 (SUTURE) ×1 IMPLANT
SUT PROLENE 4 0 PS 2 18 (SUTURE) ×2 IMPLANT
SUT SILK 2 0 (SUTURE) ×2
SUT SILK 2-0 18XBRD TIE 12 (SUTURE) ×1 IMPLANT
SUT VIC AB 4-0 RB1 27 (SUTURE) ×2
SUT VIC AB 4-0 RB1 27X BRD (SUTURE) ×1 IMPLANT
SYR 3ML LL SCALE MARK (SYRINGE) ×10 IMPLANT
SYR BULB IRRIG 60ML STRL (SYRINGE) ×2 IMPLANT

## 2020-07-25 NOTE — Op Note (Signed)
Operative Note  Preoperative Diagnosis:  primary hyperparathyroidism  Postoperative Diagnosis:  primary hyperparathyroidism  Operation:  focused/minimally invasive parathyroidectomy with intraoperative PTH monitoring  Surgeon: Fredirick Maudlin, MD  Assistant: Arvilla Meres, RNFA  Anesthesia: General endotracheal  Findings: An enlarged parathyroid gland in the left inferior position. Intraoperative PTH levels are as follows: Preincision right internal jugular: 115 Preincision left internal jugular: 124 Baseline: 837 Preexcision: 35 5-minute: 21 10-minute: 14  Indications: This is a 69 year old man who has had elevated calcium levels for a number of years. Further biochemical evaluation was consistent with primary hyperparathyroidism. He does have a decreased GFR as well as calcium elevated greater than 1 mg/dL above the upper limit of normal. He therefore met criteria for surgery. Parathyroidectomy was recommended. The risks of the procedure were discussed with him in detail and he agreed to proceed. Preoperative imaging suggested a left inferior parathyroid adenoma, however radiology interpretation of the study was not performed prior to initiation of the surgery.  Procedure In Detail: The patient was identified in the preoperative holding area and brought to the operating room where he was placed supine on the OR table. Bony prominences were padded and bilateral sequential compression devices were placed on the lower extremities. General endotracheal anesthesia was induced without incident. He was positioned appropriately for the operation. Due to the fact that his parathyroid scan had not yet been interpreted by the radiologist, I elected to draw a preincision sample from both the right and left internal jugular veins. The patient was placed in Trendelenburg and the samples were obtained and sent to the lab. He was then positioned appropriately for the operation and sterilely prepped and  draped in standard fashion. A timeout was performed confirming the patient's identity, the procedure being performed, his allergies, all necessary equipment was available, and that maintenance anesthesia was adequate.  A 6 cm transverse incision was made in a natural skin fold that was appropriately positioned for the operation. This was carried down through the subcutaneous tissues and platysma using electrocautery. We only encountered a minimal amount of scar from his prior anterior cervical discectomy and fusion sites. Subplatysmal flaps were elevated and the strap muscles were divided in the median raphae. We elevated the sternohyoid off of the sternothyroid on the left to expose the internal jugular vein. A baseline PTH level was drawn. The strap muscles were then elevated off of the thyroid tissue. I explored the inferior pole of the thyroid gland and identified an enlarged parathyroid. It was carefully dissected away from the surrounding tissues and elevated on its vascular pedicle which was then divided. It was handed off as a specimen. At the time of excision, a PTH level was drawn. 5 and 10 minutes post excision, additional PTH levels were drawn and sent to the lab. We irrigated our wound bed and obtain good hemostasis. A Valsalva maneuver from the anesthesia team confirmed no ongoing surgical bleeding. We applied SNoW and Vistaseal for additional hemostasis. The strap muscles were closed in the midline with running 4-0 Vicryl and the platysma was closed with interrupted Vicryl. The skin was closed with running subcuticular Monocryl. We awaited the results of her PTH levels and these returned as discussed under findings. The skin was cleaned. Dermabond and Steri-Strips were applied. The Monocryl suture was trimmed to the skin surface. Patient was awakened, extubated, and taken to the postanesthesia care unit in good condition.  EBL: Less than 5 cc  IVF: See anesthesia record  Specimen(s): Left  inferior parathyroid to  pathology for permanent section  Complications: none immediately apparent.   Counts: all needles, instruments, and sponges were counted and reported to be correct in number at the end of the case.   I was present for and participated in the entire operation.  Fredirick Maudlin 9:54 AM

## 2020-07-25 NOTE — Interval H&P Note (Signed)
History and Physical Interval Note:  07/25/2020 7:14 AM  Jerry Parsons  has presented today for surgery, with the diagnosis of Primary hyperparathyroidism.  The various methods of treatment have been discussed with the patient and family. After consideration of risks, benefits and other options for treatment, the patient has consented to  Procedure(s) with comments: PARATHYROIDECTOMY (N/A) - REQUESTING RNFA as a surgical intervention.  The patient's history has been reviewed, patient examined, no change in status, stable for surgery.  I have reviewed the patient's chart and labs.  Questions were answered to the patient's satisfaction.     Fredirick Maudlin

## 2020-07-25 NOTE — Transfer of Care (Signed)
Immediate Anesthesia Transfer of Care Note  Patient: Jerry Parsons  Procedure(s) Performed: PARATHYROIDECTOMY (N/A )  Patient Location: PACU  Anesthesia Type:General  Level of Consciousness: awake and alert   Airway & Oxygen Therapy: Patient Spontanous Breathing and Patient connected to face mask oxygen  Post-op Assessment: Report given to RN and Post -op Vital signs reviewed and stable  Post vital signs: Reviewed and stable  Last Vitals:  Vitals Value Taken Time  BP 127/67 07/25/20 0945  Temp 36.5 C 07/25/20 0945  Pulse 59 07/25/20 0945  Resp 11 07/25/20 0945  SpO2 95 % 07/25/20 0945  Vitals shown include unvalidated device data.  Last Pain:  Vitals:   07/25/20 0939  TempSrc:   PainSc: Asleep         Complications: No complications documented.

## 2020-07-25 NOTE — Anesthesia Procedure Notes (Signed)
Procedure Name: Intubation Date/Time: 07/25/2020 7:45 AM Performed by: Louann Sjogren, CRNA Pre-anesthesia Checklist: Patient identified, Patient being monitored, Timeout performed, Emergency Drugs available and Suction available Patient Re-evaluated:Patient Re-evaluated prior to induction Oxygen Delivery Method: Circle system utilized Preoxygenation: Pre-oxygenation with 100% oxygen Induction Type: IV induction Ventilation: Mask ventilation without difficulty and Oral airway inserted - appropriate to patient size Laryngoscope Size: McGraph and 4 Grade View: Grade I Tube type: Oral Tube size: 7.5 mm Number of attempts: 1 Airway Equipment and Method: Stylet Placement Confirmation: ETT inserted through vocal cords under direct vision,  positive ETCO2 and breath sounds checked- equal and bilateral Secured at: 21 cm Tube secured with: Tape Dental Injury: Teeth and Oropharynx as per pre-operative assessment

## 2020-07-25 NOTE — Anesthesia Preprocedure Evaluation (Addendum)
Anesthesia Evaluation  Patient identified by MRN, date of birth, ID band Patient awake    Reviewed: Allergy & Precautions, H&P , NPO status , Patient's Chart, lab work & pertinent test results  Airway Mallampati: II  TM Distance: >3 FB Neck ROM: limited   Comment: Markedly limited neck extension Dental  (+) Upper Dentures, Lower Dentures   Pulmonary sleep apnea and Continuous Positive Airway Pressure Ventilation , former smoker,    Pulmonary exam normal        Cardiovascular hypertension, +CHF (HFpEF)  + dysrhythmias Atrial Fibrillation  Rate:Normal  Atrial flutter, anticoagulated on Eliquis (held x 72 hrs)   Neuro/Psych  Headaches, Seizures - (none since age 69),  S/p multiple spine surgeries Limited cervical ROM Chronic opioid therapy Peripheral neuropathy 2/2 DM TIA (vision related, more than 1 year ago, no recent issues)negative psych ROS   GI/Hepatic Neg liver ROS, GERD  ,  Endo/Other  diabetes  Renal/GU Renal disease (CKD)     Musculoskeletal   Abdominal   Peds  Hematology Anticoagulated on Eliquis for A. 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     Comment:  x2 No date: GERD (gastroesophageal reflux disease) No date: Headache     Comment:  h/o as a child No date: Hypercholesteremia No date: Hypertension No date: Kidney failure     Comment:  acute No date: Neuromuscular disorder ([place]) No date: Neuropathy No date: Pneumonia     Comment:  time 36 ;last episode 12/2015 2011: Pulmonary embolus ([place]) No date: Seasonal  allergies No date: Seizures ([place])     Comment:  as child  No date: Sleep apnea     Comment:  BIPAP No date: Stiff neck     Comment:  limited turning s/p titanium plate placement No date: TIA (transient ischemic attack) No date: Wears dentures     Comment:  full upper and lower  Past Surgical History: No date: BACK SURGERY     Comment:  x 8; upper x 3 & lower x 5 03/14/13, 10/16: CARDIOVERSION     Comment:  2014 - [place], 2016 - [place] 10/29/2015: CATARACT EXTRACTION W/PHACO; Left     Comment:  Procedure: CATARACT EXTRACTION PHACO AND INTRAOCULAR               LENS PLACEMENT (IOC);  Surgeon: Leandrew Koyanagi, MD;               Location: [facility];  Service: Ophthalmology;                Laterality: Left;  DIABETIC - insulin and oral medsSleep               apnea - no machine No date: CHOLECYSTECTOMY 01/16/2018: COLONOSCOPY WITH PROPOFOL; N/A     Comment:  Procedure: COLONOSCOPY WITH PROPOFOL;  Surgeon: Manya Silvas, MD;  Location: [facility] END[facility];  Service:               Endoscopy;  Laterality: N/A; 01/16/2018: ESOPHAGOGASTRODUODENOSCOPY (EGD) WITH PROPOFOL; N/A  Comment:  Procedure: ESOPHAGOGASTRODUODENOSCOPY (EGD) WITH               PROPOFOL;  Surgeon: Manya Silvas, MD;  Location:               [facility] ENDOSCOPY;  Service: Endoscopy;  Laterality: N/A; No date: EYE SURGERY 2002: GALLBLADDER SURGERY 2018: JOINT REPLACEMENT; Right No date: KNEE ARTHROSCOPY     Comment:  left  2001: ROTATOR CUFF REPAIR     Comment:  left  04/26/2018: TEE WITHOUT CARDIOVERSION; N/A     Comment:  Procedure: TRANSESOPHAGEAL ECHOCARDIOGRAM (TEE);                Surgeon: Nelva Bush, MD;  Location: [facility] ORS;                Service: Cardiovascular;  Laterality: N/A; No date: TONSILLECTOMY 06/20/2017: TOTAL KNEE ARTHROPLASTY; Right     Comment:  Procedure: RIGHT TOTAL KNEE ARTHROPLASTY;  Surgeon:               Gaynelle Arabian, MD;  Location: [facility] ORS;  Service:                Orthopedics;  Laterality: Right;  BMI    Body Mass Index: 38.54 kg/m      Reproductive/Obstetrics negative OB ROS                            Anesthesia Physical Anesthesia Plan  ASA: III  Anesthesia Plan: General ETT   Post-op Pain Management:    Induction:   PONV Risk Score and Plan: Ondansetron, Dexamethasone and Treatment may vary due to age or medical condition  Airway Management Planned: Video Laryngoscope Planned  Additional Equipment:   Intra-op Plan:   Post-operative Plan:   Informed Consent: I have reviewed the patients History and Physical, chart, labs and discussed the procedure including the risks, benefits and alternatives for the proposed anesthesia with the patient or authorized representative who has indicated his/her understanding and acceptance.     Dental Advisory Given  Plan Discussed with: Anesthesiologist, CRNA and Surgeon  Anesthesia Plan Comments:         Anesthesia Quick Evaluation

## 2020-07-26 ENCOUNTER — Encounter: Payer: Self-pay | Admitting: General Surgery

## 2020-07-26 DIAGNOSIS — E210 Primary hyperparathyroidism: Secondary | ICD-10-CM | POA: Diagnosis not present

## 2020-07-26 LAB — GLUCOSE, CAPILLARY
Glucose-Capillary: 236 mg/dL — ABNORMAL HIGH (ref 70–99)
Glucose-Capillary: 245 mg/dL — ABNORMAL HIGH (ref 70–99)

## 2020-07-26 LAB — ALBUMIN: Albumin: 3.5 g/dL (ref 3.5–5.0)

## 2020-07-26 LAB — HEMOGLOBIN A1C
Hgb A1c MFr Bld: 7.6 % — ABNORMAL HIGH (ref 4.8–5.6)
Mean Plasma Glucose: 171 mg/dL

## 2020-07-26 LAB — CALCIUM: Calcium: 9.8 mg/dL (ref 8.9–10.3)

## 2020-07-26 MED ORDER — OSCAL 500/200 D-3 500-200 MG-UNIT PO TABS: 2.0000 | ORAL_TABLET | Freq: Three times a day (TID) | ORAL | 1 refills | Status: DC

## 2020-07-26 MED ORDER — OXYCODONE HCL 5 MG PO TABS: 5.0000 mg | ORAL_TABLET | ORAL | 0 refills | Status: DC | PRN

## 2020-07-26 MED ORDER — CALCITRIOL 0.25 MCG PO CAPS: 0.2500 ug | ORAL_CAPSULE | Freq: Two times a day (BID) | ORAL | 0 refills | Status: AC

## 2020-07-26 MED ORDER — TUMS E-X 750 750 MG PO CHEW
2.0000 | CHEWABLE_TABLET | Freq: Three times a day (TID) | ORAL | 1 refills | Status: DC | PRN
Start: 2020-07-26 — End: 2020-09-02

## 2020-07-26 NOTE — Progress Notes (Signed)
Dc home with wife, via wc

## 2020-07-26 NOTE — Discharge Instructions (Signed)
Post-operative Home Care After Thyroid or Parathyroid Surgery  What should I expect after my operation?   [name] You will see swelling under the incision and/or bruising under it in a few days. This is usually greatest on the second or third day after the operation. You may also feel the sensation of swelling and/or of firmness that can last for a month or more.    . Neck incisions heal rapidly, within a week or two. You can get them damp after about 24 hours, however do not submerge the incision or allow it to become soaked or saturated with water or sweat for 2 weeks.   . Your scar will be most visible 1-2 months after the operation and gradually fade over the next 6-8 months. As it heals, a scar looks more pink or red than the skin around it.   Dennis Bast may feel a firm 'healing ridge' directly under the incision. This is normal and will soften and go away when healing is complete within 3-6 months.   . All incisions are sensitive to sunlight.  For one year after surgery, you should use sunscreen when outdoors for long periods to prevent darkening of the scar area.    . We recommend that you not expose the incision to the ultraviolet lights used in tanning booths.    Will my neck hurt?  . Most patients experience very little pain, but you may feel some neck stiffness/soreness in your shoulder, back or neck and tension headache that takes a few days or weeks to go away completely.    . Some patients also notice minor changes in swallowing, which improve over time.   . The skin just above and below your incision will feel numb. This will improve over several months, but some persons may have a longterm decrease in sensation.   .  You may apply cold pack over your incision to improve the pain.    How will I manage my pain at home?  . Take NSAIDS like ibuprofen (Motrin, Advil), naproxen (Naprosyn, Aleve) or acetaminophen (Tylenol) every 6 hours for the first 3-5 days after operation. This will  minimize the pain you feel.      ? To prevent Tylenol overdose, do not take a Tylenol doses at the same time as a combination narcotic dose that contains Tylenol, like Vicodin and Percocet. However, You may take them 4-6 hours apart.     . If you have sore/stiff muscles in your back, shoulder or neck, you may use moist warm heat or heating pad on the affected areas 15-20 minutes at a time several times a day.   . Gently massaging your neck muscles will improve the neck stiffness.    . Do not be afraid to move your neck. Gently flexing and stretching your neck muscles will prevent stiffness.    . Stronger pain medication or narcotic (like Vicodin, Percocet) for severe pain is rarely needed. If it is, however, DO NOT drive a car or drink alcohol while taking these medications.    . Narcotics also cause constipation. Stool softeners (Colace) and fiber (fruits, bran, vegetables) and extra fluid intake helps. A stimulant laxative (Milk of Magnesia, Senokot) may be needed as well.    Will my voice be affected?  Your voice may be hoarse or weak at first, because the surgery took place near the voice box, but usually recovers within weeks. Some patients also notice a change in the pitch of their voices that affects  singing. Rarely, these changes can be permanent.   Are there any diet restrictions?  No, return to your previous diet and always eat a well balanced diet, low in fat, etc.    How will I care for my incision?  . If you have paper "steri strips" on your incision, leave them in place until they begin to fall off naturally. If they become discolored or messy, you may remove them 7-10 days after your operation.   . If you have a skin glue (Dermabond) closure, you may notice tiny pieces of yellow material on your washcloth. Any sutures (stitches) you may have are dissolvable and do not need to be removed.  . You may shower then gently pat dry your incision.   . Do not apply ointments or  powders.   . Avoid using Vitamin E cream or other moisturizers on the incision until after your first follow-up visit.   What new medications might I take home?   ? Calcium supplement:  Your body's calcium levels may fall after a total thyroidectomy or parathyroid operation. We recommend you purchase Os-Cal 500 (one tablet equals 500 mg of elemental calcium) or Citracal (2 tablets equal 630 mg of elemental calcium; the "Petites" version contains 500 mg elemental calcium per 2 tablets). You may be taking 3-6 (or more) tablets per day, depending on your doctor's recommendation. You will need to take calcium at different times to avoid medication interaction. Ask your pharmacists, nurse, or doctor about specific interactions.   ? Thyroid Hormone:  If you have had a thyroid operation, you may be prescribed thyroid hormone replacement, called levothyroxine (Synthroid, Levothroid, etc.). A blood test will be done in 6-8 weeks to ensure the dosage is correct  by your doctor or your surgeon.   ? Vitamin D:  If your doctor has prescribed a Vitamin D supplement, like Calcitriol (Rocaltrol), try to get it filled at the hospital pharmacy before you leave.  Sometimes regular pharmacies do not stock it and will need to order it in.  When can I go back to normal activities?  [name] You may return to work in 5-7 days or sooner if desired. Contact the clinic coordinator if you need employer forms completed.   . You may drive as long as you are not taking any narcotics and your neck stiffness is resolved    Can I resume my previous medications?   . Yes, unless directed not to by your doctor.   . Before discharge, be sure to review your previous medications with your doctor or inpatient medical team.    When do I call for advice?   - If your temperature > 101.5  - You have trouble talking or breathing  - Your fingers/ hands or face or around your lips becomes numb and tingling. (This may mean your calcium level is  low.)  - You have trouble swallowing  - Your incision becomes swollen, red or drainage occurs.      Please start taking 2 tablets of either Os-Cal 500 or Citrical 3 times daily. You may use Tums, instead, but please be sure to get the "ultra" variety.  Calcium can be quite constipating, so be sure to drink at least 64 oz of water or other non-caffeinated beverage daily. You can still drink caffeine in addition to this.  If necessary, you may use over the counter stool softeners or fiber supplements to help with constipation.  If you experience numbness or tingling in your hands, around  your mouth, or in the tip of your nose, this can be a symptom of low calcium.  Please chew 3-4 Tums and wait 30 minutes.  If the tingling has not resolved, please chew an additional 3-4 Tums and contact the surgery office.  You may also have been prescribed a prescription-strength vitamin D supplement. This is in addition to any vitamin D contained in the calcium supplement and it is OK to take both.

## 2020-07-26 NOTE — Anesthesia Postprocedure Evaluation (Signed)
Anesthesia Post Note  Patient: Jerry Parsons  Procedure(s) Performed: PARATHYROIDECTOMY (N/A )  Patient location during evaluation: PACU Anesthesia Type: General Level of consciousness: awake and alert Pain management: pain level controlled Vital Signs Assessment: post-procedure vital signs reviewed and stable Respiratory status: spontaneous breathing, nonlabored ventilation and respiratory function stable Cardiovascular status: blood pressure returned to baseline and stable Postop Assessment: no apparent nausea or vomiting Anesthetic complications: no   No complications documented.   Last Vitals:  Vitals:   07/25/20 1944 07/26/20 0405  BP: (!) 103/52 123/79  Pulse: 70 61  Resp: 20 20  Temp: 37.1 C 36.4 C  SpO2: 96% 94%    Last Pain:  Vitals:   07/26/20 0405  TempSrc: Oral  PainSc:                  [name]

## 2020-07-26 NOTE — Discharge Summary (Signed)
Physician Discharge Summary  Patient ID: Jerry Parsons MRN: [id number] DOB/AGE: 1951-07-12 69 y.o.  Admit date: 07/25/2020 Discharge date: 07/26/2020  Admission Diagnoses: Primary hyperparathyroidism  Discharge Diagnoses:  Active Problems:   Primary hyperparathyroidism ([place])   S/P parathyroidectomy   Discharged Condition: good  Hospital Course: The patient underwent an uncomplicated parathyroidectomy on Friday, July 25, 2020.  Due to his multiple medical comorbidities, he was kept in the hospital overnight for observation.  He did well and his calcium levels decreased appropriately.  He was tolerating a diet and had good pain control.  He was deemed suitable for discharge from the hospital.  Consults: None  Significant Diagnostic Studies: labs: Calcium and albumin reflecting an appropriate and expected to decrease in calcium after parathyroidectomy.  Treatments: surgery: Parathyroidectomy  Discharge Exam: Blood pressure 123/79, pulse 61, temperature 97.6 F (36.4 C), temperature source Oral, resp. rate 20, height 5\' 9"  (1.753 m), weight (!) 118.4 kg, SpO2 94 %. General appearance: alert, appears stated age and no distress Incision/Wound: Steri-Strips in place.  No erythema, induration, or drainage.  No hematoma or seroma identified.  Disposition: Discharge disposition: 01-Home or Self Care       Discharge Instructions    Call MD for:   Complete by: As directed    Tingling in the hands, lips, or tip of the nose that does not respond to additional Tums supplementation.   Call MD for:  difficulty breathing, headache or visual disturbances   Complete by: As directed    Call MD for:  extreme fatigue   Complete by: As directed    Call MD for:  hives   Complete by: As directed    Call MD for:  persistant dizziness or light-headedness   Complete by: As directed    Call MD for:  persistant nausea and vomiting   Complete by: As directed    Call MD for:  redness, tenderness,  or signs of infection (pain, swelling, redness, odor or green/yellow discharge around incision site)   Complete by: As directed    Call MD for:  severe uncontrolled pain   Complete by: As directed    Call MD for:  temperature >100.4   Complete by: As directed    Diet - low sodium heart healthy   Complete by: As directed    Discharge instructions   Complete by: As directed    You may resume taking Eliquis tomorrow (Sunday).   Increase activity slowly   Complete by: As directed    Leave dressing on - Keep it clean, dry, and intact until clinic visit   Complete by: As directed    You may shower starting tomorrow (Sunday).  Do not submerge the incision or allow to become saturated with water or sweat.  It is okay if it gets a little bit damp; just pat it dry gently with a clean soft towel.  Leave the Steri-Strips (paper tapes) in place and allow them to fall off on their own.     Allergies as of 07/26/2020      Reactions   Carisoprodol Itching   Tamsulosin    Pt stated, "upsets my stomach"      Medication List    TAKE these medications   amiodarone 200 MG tablet Commonly known as: PACERONE TAKE 1 TABLET BY MOUTH EVERY DAY What changed: when to take this   apixaban 5 MG Tabs tablet Commonly known as: ELIQUIS Take 1 tablet (5 mg total) by mouth 2 (two) times daily.  calcitRIOL 0.25 MCG capsule Commonly known as: ROCALTROL Take 1 capsule (0.25 mcg total) by mouth 2 (two) times daily for 14 days. Take in addition to cholecalciferol (vitamin D).   cholecalciferol 25 MCG (1000 UNIT) tablet Commonly known as: VITAMIN D3 Take 1,000 Units by mouth daily.   cyclobenzaprine 10 MG tablet Commonly known as: FLEXERIL TAKE 1 TABLET BY MOUTH 3 TIMES A DAY   docusate sodium 250 MG capsule Commonly known as: COLACE Take 250 mg by mouth daily as needed for constipation.   dutasteride 0.5 MG capsule Commonly known as: AVODART TAKE 1 CAPSULE DAILY What changed: when to take this    esomeprazole 40 MG capsule Commonly known as: NEXIUM Take 1 capsule (40 mg total) by mouth 2 (two) times daily.   fexofenadine 180 MG tablet Commonly known as: ALLEGRA Take 180 mg by mouth every morning.   Fish Oil 1200 MG Caps Take 1,200 mg by mouth 2 (two) times daily.   furosemide 40 MG tablet Commonly known as: LASIX Take 40 mg by mouth every morning.   gabapentin 100 MG capsule Commonly known as: NEURONTIN TAKE 1 CAPSULE BY MOUTH TWICE A DAY   hydrochlorothiazide 12.5 MG tablet Commonly known as: HYDRODIURIL TAKE 2 TABLETS (25 MG TOTAL) BY MOUTH AT BEDTIME.   Januvia 100 MG tablet Generic drug: sitaGLIPtin TAKE 1 TABLET DAILY What changed: how much to take   Lantus SoloStar 100 UNIT/ML Solostar Pen Generic drug: insulin glargine Inject 38 Units into the skin 2 (two) times daily.   magnesium oxide 400 MG tablet Commonly known as: MAG-OX TAKE 1 TABLET (400 MG TOTAL) BY MOUTH 2 (TWO) TIMES DAILY. What changed: See the new instructions.   Melatonin Gummies 2.5 MG Chew Chew 5 mg by mouth at bedtime.   metoprolol tartrate 100 MG tablet Commonly known as: LOPRESSOR TAKE 1 TABLET (100 MG TOTAL) BY MOUTH 2 (TWO) TIMES DAILY.   Naloxone HCl 0.4 MG/0.4ML Soaj Inject as needed for suspected overdose.   naloxone 0.4 MG/ML injection Commonly known as: NARCAN INJECT AS NEEDED FOR SUSPECTED OVERDOSE.   NovoLOG FlexPen 100 UNIT/ML FlexPen Generic drug: insulin aspart INJECT 12 UNITS            SUBCUTANEOUSLY 3 TIMES A   DAY WITH MEALS What changed: See the new instructions.   OneTouch Ultra test strip Generic drug: glucose blood USE WITH METER TWICE DAILY   Oscal 500/200 D-3 500-200 MG-UNIT tablet Generic drug: calcium-vitamin D Take 2 tablets by mouth 3 (three) times daily for 21 days. Take with food.   oxyCODONE 40 mg 12 hr tablet Commonly known as: OxyCONTIN Take 1 tablet (40 mg total) by mouth every 8 (eight) hours as needed. What changed: when to take  this   oxyCODONE 5 MG immediate release tablet Commonly known as: Oxy IR/ROXICODONE Take 1-2 tablets (5-10 mg total) by mouth every 4 (four) hours as needed for moderate pain. What changed: You were already taking a medication with the same name, and this prescription was added. Make sure you understand how and when to take each.   Pen Needles 31G X 5 MM Misc 1 each by Does not apply route daily. PATIENT NEEDS NOVA TWIST NEEDLES 5 MM  DX E11.9   NovoTwist 32G X 5 MM Misc Generic drug: Insulin Pen Needle USE 6 TIMES A DAY DX E11.9   pregabalin 200 MG capsule Commonly known as: LYRICA TAKE 1 CAPSULE 3 TIMES A   DAY What changed: See the new instructions.  rOPINIRole 2 MG tablet Commonly known as: REQUIP Take 1 tablet twice a day as needed. What changed:   how much to take  how to take this  when to take this  additional instructions   rOPINIRole 1 MG tablet Commonly known as: REQUIP TAKE 1 TO 2 TABLETS BY MOUTH AT BEDTIME AS NEEDED What changed:   how much to take  when to take this   sucralfate 1 g tablet Commonly known as: CARAFATE TAKE 1 TABLET BY MOUTH 4 (FOUR) TIMES DAILY - WITH MEALS AND AT BEDTIME. What changed: See the new instructions.   tamsulosin 0.4 MG Caps capsule Commonly known as: FLOMAX Take 0.4 mg by mouth daily.   testosterone cypionate 200 MG/ML injection Commonly known as: DEPOTESTOSTERONE CYPIONATE Inject 200 mg into the muscle every 21 ( twenty-one) days.   Tums E-X 750 750 MG chewable tablet Generic drug: calcium carbonate Chew 2 tablets (1,500 mg total) by mouth 3 (three) times daily as needed (for tingling in hands, lips, or nose.). This is in addition to scheduled calcium carbonate doses,   Victoza 18 MG/3ML Sopn Generic drug: liraglutide INJECT 0.3ML (=1.8MG )      SUBCUTANEOUSLY DAILY What changed:   how much to take  how to take this  when to take this  additional instructions            Discharge Care Instructions   (From admission, onward)         Start     Ordered   07/26/20 0000  Leave dressing on - Keep it clean, dry, and intact until clinic visit       Comments: You may shower starting tomorrow (Sunday).  Do not submerge the incision or allow to become saturated with water or sweat.  It is okay if it gets a little bit damp; just pat it dry gently with a clean soft towel.  Leave the Steri-Strips (paper tapes) in place and allow them to fall off on their own.   07/26/20 1125           Signed: Fredirick Maudlin 07/26/2020, 11:27 AM

## 2020-07-28 ENCOUNTER — Telehealth: Payer: Self-pay | Admitting: General Surgery

## 2020-07-28 LAB — SURGICAL PATHOLOGY

## 2020-07-28 NOTE — Telephone Encounter (Signed)
Patient calls to schedule his two week post op.  He further states that he had jaw pain yesterday (none today) and is currently having tingling in his fingers and knuckles on the left.  Please call as he is not sure if this could be heart related or related to his surgery.  He is also advised to contact his primary care doctor.

## 2020-07-29 ENCOUNTER — Other Ambulatory Visit: Payer: Self-pay | Admitting: Family Medicine

## 2020-07-29 DIAGNOSIS — G8929 Other chronic pain: Secondary | ICD-10-CM

## 2020-07-29 DIAGNOSIS — M5442 Lumbago with sciatica, left side: Secondary | ICD-10-CM

## 2020-07-29 DIAGNOSIS — IMO0002 Reserved for concepts with insufficient information to code with codable children: Secondary | ICD-10-CM

## 2020-07-29 DIAGNOSIS — J690 Pneumonitis due to inhalation of food and vomit: Secondary | ICD-10-CM

## 2020-07-29 DIAGNOSIS — E1165 Type 2 diabetes mellitus with hyperglycemia: Secondary | ICD-10-CM

## 2020-07-29 DIAGNOSIS — I480 Paroxysmal atrial fibrillation: Secondary | ICD-10-CM

## 2020-07-29 DIAGNOSIS — R600 Localized edema: Secondary | ICD-10-CM

## 2020-07-29 MED ORDER — APIXABAN 5 MG PO TABS: 5.0000 mg | ORAL_TABLET | Freq: Two times a day (BID) | ORAL | 0 refills | Status: DC

## 2020-07-29 MED ORDER — ESOMEPRAZOLE MAGNESIUM 40 MG PO CPDR: 40.0000 mg | DELAYED_RELEASE_CAPSULE | Freq: Two times a day (BID) | ORAL | 0 refills | Status: DC

## 2020-07-29 MED ORDER — AMOXICILLIN-POT CLAVULANATE 875-125 MG PO TABS: 1.0000 | ORAL_TABLET | Freq: Two times a day (BID) | ORAL | 1 refills | Status: DC

## 2020-07-29 NOTE — Telephone Encounter (Signed)
Medication Refill - Medication: Eliquis 5 mg,  Nexium Generic 40 mg  90 day supply on both (Bid on both)   Has the patient contacted their pharmacy? No. (Agent: If no, request that the patient contact the pharmacy for the refill.) (Agent: If yes, when and what did the pharmacy advise?)  Preferred Pharmacy (with phone number or street [facility]): [facility]  Agent: Please be advised that RX refills may take up to 3 business days. We ask that you follow-up with your pharmacy.

## 2020-07-29 NOTE — Telephone Encounter (Signed)
Spoke with patient- he states the tingling and jaw pain is much better- he is eating 2-3 tums a day which has helped. He was reminded to keep his scheduled appointment 8/17.

## 2020-07-29 NOTE — Telephone Encounter (Signed)
Requested medication (s) are due for refill today -yes  Requested medication (s) are on the active medication list -yes  Future visit scheduled -yes  Last refill: Cyclobenzaprine 06/25/20                  Furosemide 05/08/20  Notes to clinic: Request for RF of non delegated Rx and Rx by outside provider- sent for review  Requested Prescriptions  Pending Prescriptions Disp Refills   cyclobenzaprine (FLEXERIL) 10 MG tablet [Pharmacy Med Name: CYCLOBENZAPRINE 10 MG TABLET] 90 tablet 12    Sig: TAKE 1 TABLET BY MOUTH 3 TIMES A DAY      Not Delegated - Analgesics:  Muscle Relaxants Failed - 07/29/2020 12:29 PM      Failed - This refill cannot be delegated      Passed - Valid encounter within last 6 months    Recent Outpatient Visits           1 month ago Typical atrial flutter ([place])   [facility] Jerrol Banana., [facility]   2 months ago Hypercalcemia   [facility] Jerrol Banana[facility] MD   2 months ago Diabetes mellitus type 2, uncontrolled, with complications [facility])   [facility] Jerrol Ban[facility]., MD   4 months ago RLS (restless legs syndrome)   [facility] Jerrol Banana., MD   5 months ago Diabetes mellitus type 2, uncontrolled, with complications [facility])   [facility] Jerrol Banana., MD       Future Appointments             In 1 month Gollan, Kathlene November, MD [place], [facility]              furosemide (LASIX) 40 MG tablet [Pharmacy Med Name: FUROSEMIDE 40 MG TABLET] 180 tablet     Sig: TAKE 1 TABLET (40 MG) BY MOUTH ONCE DAILY, YOU MAY TAKE 1 EXTRA TABLET (40 MG) AFTER LUNCH AS NEEDED FOR LEG SWELLING/ ABDOMINAL TIGHTNESS      Cardiovascular:  Diuretics - Loop Failed - 07/29/2020 12:29 PM      Failed - Cr in normal range and within 360 days    Creat  Date Value Ref Range Status  12/13/2017 1.39 (H) 0.70 - 1.25 mg/dL Final    Comment:    For patients >32  years of age, the reference limit for Creatinine is approximately 13% higher for people identified as African-American. .    Creatinine, Ser  Date Value Ref Range Status  07/02/2020 1.30 (H) 0.76 - 1.27 mg/dL Final   Creatinine, Urine  Date Value Ref Range Status  06/24/2020 73 mg/dL Final          Passed - K in normal range and within 360 days    Potassium  Date Value Ref Range Status  07/02/2020 4.8 3.5 - 5.2 mmol/L Final  04/12/2013 4.0 3.5 - 5.1 mmol/L Final          Passed - Ca in normal range and within 360 days    Calcium  Date Value Ref Range Status  07/26/2020 9.8 8.9 - 10.3 mg/dL Final    Comment:    Performed at [facility], [address] [place], [place] [address]   Calcium, Total  Date Value Ref Range Status  04/12/2013 8.8 8.5 - 10.1 mg/dL Final   Calcium, Ion  Date Value Ref Range Status  05/29/2020 6.0 (H) 4.5 - 5.6  mg/dL Final          Passed - Na in normal range and within 360 days    Sodium  Date Value Ref Range Status  07/02/2020 143 134 - 144 mmol/L Final  04/12/2013 137 136 - 145 mmol/L Final          Passed - Last BP in normal range    BP Readings from Last 1 Encounters:  07/26/20 (!) 136/73          Passed - Valid encounter within last 6 months    Recent Outpatient Visits           1 month ago Typical atrial flutter ([place])   [facility]   2 months ago Hypercalcemia   [facility] Jer[facility] Ban[facility] MD   2 months ago Diabetes mellitus type 2, uncontrolled, with complications [facility])   [facility] Jerrol Banana., MD   4 months ago RLS (restless legs syndrome)   [facility] Jerrol Banana., MD   5 months ago Diabetes mellitus type 2, uncontrolled, with complications [facility])   [facility] Jerrol Banana., MD       Future Appointments             In 1 month Gollan, Kathlene November, MD [facility], [facility]                Requested Prescriptions  Pending Prescriptions Disp Refills   cyclobenzaprine (FLEXERIL) 10 MG tablet [Pharmacy Med Name: CYCLOBENZAPRINE 10 MG TABLET] 90 tablet 12    Sig: TAKE 1 TABLET BY MOUTH 3 TIMES A DAY      Not Delegated - Analgesics:  Muscle Relaxants Failed - 07/29/2020 12:29 PM      Failed - This refill cannot be delegated      Passed - Valid encounter within last 6 months    Recent Outpatient Visits           1 month ago Typical atrial flutter ([place])   [facility]   2 months ago Hypercalcemia   [facility]   2 months ago Diabetes mellitus type 2, uncontrolled, with complications [facility]   [facility] MD   4 months ago RLS (restless legs syndrome)   [facility] Jerrol Banana[facility] MD   5 months ago Diabetes mellitus type 2, uncontrolled, with complications [facility])   [facility] Jerrol [facility] MD       Future Appointments             In 1 month Gollan, Kathlene November, MD [place], [facility]              furosemide (LASIX) 40 MG tablet [Pharmacy Med Name: FUROSEMIDE 40 MG TABLET] 180 tablet     Sig: TAKE 1 TABLET (40 MG) BY MOUTH ONCE DAILY, YOU MAY TAKE 1 EXTRA TABLET (40 MG) AFTER LUNCH AS NEEDED FOR LEG SWELLING/ ABDOMINAL TIGHTNESS      Cardiovascular:  Diuretics - Loop Failed - 07/29/2020 12:29 PM      Failed - Cr in normal range and within 360 days    Creat  Date Value Ref Range Status  12/13/2017 1.39 (H) 0.70 - 1.25 mg/dL Final    Comment:    For patients >40 years of age, the reference  limit for Creatinine is approximately 13% higher for people identified as African-American. .    Creatinine, Ser  Date Value Ref Range Status  07/02/2020 1.30 (H) 0.76 - 1.27 mg/dL Final   Creatinine, Urine  Date Value Ref Range  Status  06/24/2020 73 mg/dL Final          Passed - K in normal range and within 360 days    Potassium  Date Value Ref Range Status  07/02/2020 4.8 3.5 - 5.2 mmol/L Final  04/12/2013 4.0 3.5 - 5.1 mmol/L Final          Passed - Ca in normal range and within 360 days    Calcium  Date Value Ref Range Status  07/26/2020 9.8 8.9 - 10.3 mg/dL Final    Comment:    Performed at [facility], [address] [place], [place] [address]   Calcium, Total  Date Value Ref Range Status  04/12/2013 8.8 8.5 - 10.1 mg/dL Final   Calcium, Ion  Date Value Ref Range Status  05/29/2020 6.0 (H) 4.5 - 5.6 mg/dL Final          Passed - Na in normal range and within 360 days    Sodium  Date Value Ref Range Status  07/02/2020 143 134 - 144 mmol/L Final  04/12/2013 137 136 - 145 mmol/L Final          Passed - Last BP in normal range    BP Readings from Last 1 Encounters:  07/26/20 (!) 136/73          Passed - Valid encounter within last 6 months    Recent Outpatient Visits           1 month ago Typical atrial flutter [facility])   [facility] MD   2 months ago Hypercalcemia   [facility] Jerrol Banana., MD   2 months ago Diabetes mellitus type 2, uncontrolled, with complications [facility])   [facility] Jerrol Banana., MD   4 months ago RLS (restless legs syndrome)   [facility] Jerrol Banana., MD   5 months ago Diabetes mellitus type 2, uncontrolled, with complications [facility])   [facility] Jerrol Banana., MD       Future Appointments             In 1 month Gollan, Kathlene November, MD [facility] [place]

## 2020-07-29 NOTE — Telephone Encounter (Signed)
Requested medication (s) are due for refill today: yes  Requested medication (s) are on the active medication list: yes  Last refill: flexeril 07/13/19  #90  12 refills, Lasix 07/02/20 historic provider  Future visit scheduled:No  Notes to clinic:  flexeril not delegated, lasix ordered last by Cardiology    Requested Prescriptions  Pending Prescriptions Disp Refills   cyclobenzaprine (FLEXERIL) 10 MG tablet [Pharmacy Med Name: CYCLOBENZAPRINE 10 MG TABLET] 90 tablet 12    Sig: TAKE 1 TABLET BY MOUTH 3 TIMES A DAY      Not Delegated - Analgesics:  Muscle Relaxants Failed - 07/29/2020 11:56 AM      Failed - This refill cannot be delegated      Passed - Valid encounter within last 6 months    Recent Outpatient Visits           1 month ago Typical atrial flutter ([place])   [facility]   2 months ago Hypercalcemia   [facility] Jerrol Banana., MD   2 months ago Diabetes mellitus type 2, uncontrolled, with complications [facility])   [facility] Jer[facility]., MD   4 months ago RLS (restless legs syndrome)   [facility] Jer[facility] Banana[facility] MD   5 months ago Diabetes mellitus type 2, uncontrolled, with complications [facility])   [facility] Jerrol Banana., MD       Future Appointments             In 1 month Gollan, Kathlene November, MD [place], [facility]              furosemide (LASIX) 40 MG tablet [Pharmacy Med Name: FUROSEMIDE 40 MG TABLET] 180 tablet     Sig: TAKE 1 TABLET (40 MG) BY MOUTH ONCE DAILY, YOU MAY TAKE 1 EXTRA TABLET (40 MG) AFTER LUNCH AS NEEDED FOR LEG SWELLING/ ABDOMINAL TIGHTNESS      Cardiovascular:  Diuretics - Loop Failed - 07/29/2020 11:56 AM      Failed - Cr in normal range and within 360 days    Creat  Date Value Ref Range Status  12/13/2017 1.39 (H) 0.70 - 1.25 mg/dL Final    Comment:    For patients >22 years of age,  the reference limit for Creatinine is approximately 13% higher for people identified as African-American. .    Creatinine, Ser  Date Value Ref Range Status  07/02/2020 1.30 (H) 0.76 - 1.27 mg/dL Final   Creatinine, Urine  Date Value Ref Range Status  06/24/2020 73 mg/dL Final          Passed - K in normal range and within 360 days    Potassium  Date Value Ref Range Status  07/02/2020 4.8 3.5 - 5.2 mmol/L Final  04/12/2013 4.0 3.5 - 5.1 mmol/L Final          Passed - Ca in normal range and within 360 days    Calcium  Date Value Ref Range Status  07/26/2020 9.8 8.9 - 10.3 mg/dL Final    Comment:    Performed at [facility], [address] [place], [place] [address]   Calcium, Total  Date Value Ref Range Status  04/12/2013 8.8 8.5 - 10.1 mg/dL Final   Calcium, Ion  Date Value Ref Range Status  05/29/2020 6.0 (H) 4.5 - 5.6 mg/dL Final          Passed - Na in  normal range and within 360 days    Sodium  Date Value Ref Range Status  07/02/2020 143 134 - 144 mmol/L Final  04/12/2013 137 136 - 145 mmol/L Final          Passed - Last BP in normal range    BP Readings from Last 1 Encounters:  07/26/20 (!) 136/73          Passed - Valid encounter within last 6 months    Recent Outpatient Visits           1 month ago Typical atrial flutter [facility])   [facility] Jerrol Banana., MD   2 months ago Hypercalcemia   [facility]   2 months ago Diabetes mellitus type 2, uncontrolled, with complications [facility])   [facility] Jerrol Banana., MD   4 months ago RLS (restless legs syndrome)   [facility] Jerrol Banana., MD   5 months ago Diabetes mellitus type 2, uncontrolled, with complications [facility])   [facility] Jerrol Banana., MD       Future Appointments             In 1 month Gollan, Kathlene November, MD [employer] [facility] [facility], [facility]

## 2020-07-30 NOTE — Telephone Encounter (Signed)
Spoke w/patient and his wife.  He was not taking the amount of calcium I had advised, taking only 2 Tums TID, rather than the 2 tablets of Os-Cal TID.  He will take 4 Tums TID and use additional Tums in between, if he experiences tingling or cramping.

## 2020-08-05 ENCOUNTER — Ambulatory Visit: Payer: Medicare Other | Admitting: Cardiovascular Disease

## 2020-08-06 ENCOUNTER — Other Ambulatory Visit: Payer: Self-pay | Admitting: Family Medicine

## 2020-08-06 ENCOUNTER — Telehealth: Payer: Self-pay

## 2020-08-06 DIAGNOSIS — M48061 Spinal stenosis, lumbar region without neurogenic claudication: Secondary | ICD-10-CM

## 2020-08-06 DIAGNOSIS — E291 Testicular hypofunction: Secondary | ICD-10-CM | POA: Diagnosis not present

## 2020-08-06 MED ORDER — OXYCODONE HCL ER 40 MG PO T12A: 40.0000 mg | EXTENDED_RELEASE_TABLET | Freq: Three times a day (TID) | ORAL | 0 refills | Status: DC | PRN

## 2020-08-06 NOTE — Telephone Encounter (Signed)
Copied from [place] [phone]. Topic: Quick Communication - Rx Refill/Question >> Aug 06, 2020  1:25 PM Leward Quan A wrote: Medication: esomeprazole (NEXIUM) 40 MG capsule, oxyCODONE (OXYCONTIN) 40 mg 12 hr tablet  Has the patient contacted their pharmacy? No. (Agent: If no, request that the patient contact the pharmacy for the refill.) (Agent: If yes, when and what did the pharmacy advise?)  Preferred Pharmacy (with phone number or street name): [facility] [place], [place] to Registered [place] Sites  Phone:  [phone] Fax:  [phone]     Agent: Please be advised that RX refills may take up to 3 business days. We ask that you follow-up with your pharmacy.

## 2020-08-06 NOTE — Telephone Encounter (Signed)
Requested medication (s) are due for refill today: yes  Requested medication (s) are on the active medication list: yes  Last refill:  05/12/20 #270  Future visit scheduled: no  Notes to clinic:  Please review for refill. Refill not delegated per protocol    Requested Prescriptions  Pending Prescriptions Disp Refills   oxyCODONE (OXYCONTIN) 40 mg 12 hr tablet 270 tablet 0    Sig: Take 1 tablet (40 mg total) by mouth every 8 (eight) hours as needed.      Not Delegated - Analgesics:  Opioid Agonists Failed - 08/06/2020  1:33 PM      Failed - This refill cannot be delegated      Failed - Urine Drug Screen completed in last 360 days.      Passed - Valid encounter within last 6 months    Recent Outpatient Visits           1 month ago Typical atrial flutter [facility])   [facility] Jer[facility] MD   2 months ago Hypercalcemia   [facility] Jer[facility] Ban[facility] MD   2 months ago Diabetes mellitus type 2, uncontrolled, with complications [facility]   [facility] Jer[facility] MD   4 months ago RLS (restless legs syndrome)   [facility] Jer[facility] Banana[facility] MD   5 months ago Diabetes mellitus type 2, uncontrolled, with complications [facility])   [facility] Jerrol Banana., MD       Future Appointments             In 3 weeks Gollan, Kathlene November, MD [facility], [facility]             Refused Prescriptions Disp Refills   esomeprazole (NEXIUM) 40 MG capsule 180 capsule 0    Sig: Take 1 capsule (40 mg total) by mouth 2 (two) times daily.      Gastroenterology: Proton Pump Inhibitors Passed - 08/06/2020  1:33 PM      Passed - Valid encounter within last 12 months    Recent Outpatient Visits           1 month ago Typical atrial flutter [facility])   [facility] Jerrol Banana., MD   2 months ago Hypercalcemia   [facility] Jerrol Banana., MD   2 months ago Diabetes mellitus type 2, uncontrolled, with complications [facility])   [facility] Jerrol Banana[facility] MD   4 months ago RLS (restless legs syndrome)   [facility] Jerrol Banana., MD   5 months ago Diabetes mellitus type 2, uncontrolled, with complications [facility])   [facility] Jerrol Banana., MD       Future Appointments             In 3 weeks Gollan, Kathlene November, MD [facility], [facility]

## 2020-08-06 NOTE — Telephone Encounter (Signed)
Call to patient to review heart monitor results.    Pt verbalized understanding and has no further questions at this time.    Advised pt to call for any further questions or concerns.  No further orders.

## 2020-08-06 NOTE — Telephone Encounter (Signed)
-----   Message from Rise Mu, PA-C sent at 08/06/2020 11:59 AM EDT ----- Atrial flutter with 100% burden with rates ranging from 51-139 bpm with an average rate of 80 bpm. He is anticoagulated. Follow up as planned to discuss rhythm control strategy.

## 2020-08-12 ENCOUNTER — Encounter: Payer: Self-pay | Admitting: General Surgery

## 2020-08-12 ENCOUNTER — Ambulatory Visit (INDEPENDENT_AMBULATORY_CARE_PROVIDER_SITE_OTHER): Payer: Medicare Other | Admitting: General Surgery

## 2020-08-12 ENCOUNTER — Other Ambulatory Visit: Payer: Self-pay

## 2020-08-12 VITALS — BP 149/81 | HR 88 | Temp 99.1°F | Ht 70.0 in | Wt 254.2 lb

## 2020-08-12 DIAGNOSIS — E892 Postprocedural hypoparathyroidism: Secondary | ICD-10-CM

## 2020-08-12 DIAGNOSIS — Z9889 Other specified postprocedural states: Secondary | ICD-10-CM

## 2020-08-12 NOTE — Patient Instructions (Signed)
Patient was advised to massage Vitamin E Oil on the scar to help with healing. Dr.Cannon advised patient to decrease Tums from 4 tablets to 2 tablets. Patient was advised to give our office a call if he notices any tingling in his hands or lower extremities.  GENERAL POST-OPERATIVE PATIENT INSTRUCTIONS   WOUND CARE INSTRUCTIONS:  Keep a dry clean dressing on the wound if there is drainage. The initial bandage may be removed after 24 hours.  Once the wound has quit draining you may leave it open to air.  If clothing rubs against the wound or causes irritation and the wound is not draining you may cover it with a dry dressing during the daytime.  Try to keep the wound dry and avoid ointments on the wound unless directed to do so.  If the wound becomes bright red and painful or starts to drain infected material that is not clear, please contact your physician immediately.  If the wound is mildly pink and has a thick firm ridge underneath it, this is normal, and is referred to as a healing ridge.  This will resolve over the next 4-6 weeks.  BATHING: You may shower if you have been informed of this by your surgeon. However, Please do not submerge in a tub, hot tub, or pool until incisions are completely sealed or have been told by your surgeon that you may do so.  DIET:  You may eat any foods that you can tolerate.  It is a good idea to eat a high fiber diet and take in plenty of fluids to prevent constipation.  If you do become constipated you may want to take a mild laxative or take ducolax tablets on a daily basis until your bowel habits are regular.  Constipation can be very uncomfortable, along with straining, after recent surgery.  ACTIVITY:  You are encouraged to cough and deep breath or use your incentive spirometer if you were given one, every 15-30 minutes when awake.  This will help prevent respiratory complications and low grade fevers post-operatively if you had a general anesthetic.  You may  want to hug a pillow when coughing and sneezing to add additional support to the surgical area, if you had abdominal or chest surgery, which will decrease pain during these times.  You are encouraged to walk and engage in light activity for the next two weeks.  You should not lift more than 10 pounds, 4 to 6 weeks after surgery as it could put you at increased risk for complications.  Twenty pounds is roughly equivalent to a plastic bag of groceries. At that time- Listen to your body when lifting, if you have pain when lifting, stop and then try again in a few days. Soreness after doing exercises or activities of daily living is normal as you get back in to your normal routine.  MEDICATIONS:  Try to take narcotic medications and anti-inflammatory medications, such as tylenol, ibuprofen, naprosyn, etc., with food.  This will minimize stomach upset from the medication.  Should you develop nausea and vomiting from the pain medication, or develop a rash, please discontinue the medication and contact your physician.  You should not drive, make important decisions, or operate machinery when taking narcotic pain medication.  SUNBLOCK Use sun block to incision area over the next year if this area will be exposed to sun. This helps decrease scarring and will allow you avoid a permanent darkened area over your incision.  QUESTIONS:  Please feel free to  call our office if you have any questions, and we will be glad to assist you. [phone].

## 2020-08-12 NOTE — Progress Notes (Signed)
Jerry Parsons is here today for a postoperative visit.  He underwent a parathyroidectomy on July 25, 2020.  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  Due to his medical comorbidities, he was observed overnight in the hospital and discharged on postop day 1.  He reports doing well since his operation.  He says this is the only surgery he is ever had where he woke up and actually felt good.  His voice and swallowing are normal.  He has been taking 4 Tums 3 times a day.  He denies any paresthesias or other symptoms of hypocalcemia, currently, however he did have some tingling in the first couple of days postoperatively because he had not been taking the recommended calcium.  Once he began taking it, the symptoms resolved.  Today's Vitals   08/12/20 0959  BP: (!) 149/81  Pulse: 88  Temp: 99.1 F (37.3 C)  TempSrc: Oral  SpO2: 93%  Weight: 254 lb 3.2 oz (115.3 kg)  Height: 5\' 10"  (1.778 m)  PainSc: 0-No pain  PainLoc: Neck   Body mass index is 36.47 kg/m. Focused examination of the neck demonstrates a well approximated transverse incision.  It is healing nicely and starting to fade.  There is no surrounding erythema, induration, or drainage.  There is a slight thickening where the transverse scar abuts his prior ACDF scar.  Impression and plan: This is a 69 year old man who had biochemical evidence of primary hyperparathyroidism.  He underwent a single gland parathyroidectomy with an appropriate drop in his intraoperative PTH.  He is doing well.  I have recommended that he drop his calcium from 4 tablets 3 times daily to 2 tablets 3 times daily and maintain this regimen for 1 week.  If he has no symptoms of hypocalcemia after that week, he may discontinue the calcium entirely.  He was also instructed in scar care today including massage with a topical emollient agent to help soften, flatten, and  fade.  I will see him on an as-needed basis.

## 2020-08-14 ENCOUNTER — Other Ambulatory Visit: Payer: Self-pay | Admitting: Cardiovascular Disease

## 2020-08-18 ENCOUNTER — Other Ambulatory Visit: Payer: Self-pay | Admitting: Family Medicine

## 2020-08-18 DIAGNOSIS — K219 Gastro-esophageal reflux disease without esophagitis: Secondary | ICD-10-CM

## 2020-08-18 MED ORDER — ESOMEPRAZOLE MAGNESIUM 40 MG PO CPDR: 40.0000 mg | DELAYED_RELEASE_CAPSULE | Freq: Two times a day (BID) | ORAL | 3 refills | Status: DC

## 2020-08-19 ENCOUNTER — Other Ambulatory Visit: Payer: Self-pay | Admitting: Family Medicine

## 2020-08-20 ENCOUNTER — Other Ambulatory Visit: Payer: Self-pay

## 2020-08-20 ENCOUNTER — Ambulatory Visit (INDEPENDENT_AMBULATORY_CARE_PROVIDER_SITE_OTHER): Payer: Medicare Other | Admitting: Sports Medicine

## 2020-08-20 ENCOUNTER — Encounter: Payer: Self-pay | Admitting: Sports Medicine

## 2020-08-20 DIAGNOSIS — E1142 Type 2 diabetes mellitus with diabetic polyneuropathy: Secondary | ICD-10-CM | POA: Diagnosis not present

## 2020-08-20 DIAGNOSIS — M79675 Pain in left toe(s): Secondary | ICD-10-CM

## 2020-08-20 DIAGNOSIS — I739 Peripheral vascular disease, unspecified: Secondary | ICD-10-CM | POA: Diagnosis not present

## 2020-08-20 DIAGNOSIS — M79674 Pain in right toe(s): Secondary | ICD-10-CM | POA: Diagnosis not present

## 2020-08-20 DIAGNOSIS — B351 Tinea unguium: Secondary | ICD-10-CM | POA: Diagnosis not present

## 2020-08-20 NOTE — Progress Notes (Signed)
Subjective: Jerry Parsons is a 69 y.o. male patient with history of diabetes who presents to office today complaining of long,mildly painful nails while ambulating in shoes; unable to trim.  Patient states that the glucose reading this morning was 300 mg/dl, reports that it was secondary to ice cream that he had last night. Last A1c 9.4. Was last seen by PCP Dr. [name] 3 months ago. Patient denies any new changes in medication or new problems. Reports that his left toe and right ankle pain is much better and does not bother him anymore.   Patient Active Problem List   Diagnosis Date Noted  . 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  . Primary hyperparathyroidism ([place])   . Loss of consciousness ([place]) 06/05/2020  . Osteoarthritis of both knees 11/11/2018  . OSA on CPAP 10/14/2018  . MRSA bacteremia 05/19/2018  . Lumbar spondylosis 05/05/2018  . Spinal stenosis of lumbar region 05/05/2018  . Constipation, chronic 03/08/2018  . Pain in limb 03/03/2018  . Gastroesophageal reflux disease without esophagitis 01/04/2018  . OA (osteoarthritis) of knee 06/20/2017  . Sepsis due to pneumonia ([place]) 05/07/2017  . Neuropathy 10/13/2016  . Amblyopia 12/30/2015  . 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 NS (nuclear sclerosis) 12/30/2015  . Pseudoaphakia 12/30/2015  . Dehydration 09/08/2015  . Aspiration pneumonia ([place]) 07/04/2015  . Paroxysmal atrial fibrillation ([place]) 07/04/2015  . Arthritis of knee, degenerative 05/28/2015  . Chronic back pain 09/17/2014  . Leg edema 05/07/2014  . Chronic diastolic CHF (congestive heart failure) ([place]) 05/07/2014  . Campylobacter diarrhea 04/25/2013  . Atrial flutter ([place]) 01/23/2013  . Obesity 05/19/2012  . Hyperlipidemia 11/11/2011  . Diastolic dysfunction 83/38/2505  . SOB (shortness of breath) 04/12/2011  . Diabetes mellitus type 2, uncontrolled, with complications ([place]) 39/76/7341  . HYPERTENSION, BENIGN 03/15/2011  . DVT 03/15/2011  . TACHYCARDIA 03/15/2011   Current  Outpatient Medications on File Prior to Visit  Medication Sig Dispense Refill  . amiodarone (PACERONE) 200 MG tablet TAKE 1 TABLET BY MOUTH EVERY DAY 90 tablet 0  . amoxicillin-clavulanate (AUGMENTIN) 875-125 MG tablet Take 1 tablet by mouth 2 (two) times daily. (Patient not taking: Reported on 08/12/2020) 14 tablet 1  . apixaban (ELIQUIS) 5 MG TABS tablet Take 1 tablet (5 mg total) by mouth 2 (two) times daily. 180 tablet 0  . calcium carbonate (TUMS E-X 750) 750 MG chewable tablet Chew 2 tablets (1,500 mg total) by mouth 3 (three) times daily as needed (for tingling in hands, lips, or nose.). This is in addition to scheduled calcium carbonate doses, 90 tablet 1  . calcium-vitamin D (OSCAL 500/200 D-3) 500-200 MG-UNIT tablet Take 2 tablets by mouth 3 (three) times daily for 21 days. Take with food. 126 tablet 1  . cholecalciferol (VITAMIN D3) 25 MCG (1000 UT) tablet Take 1,000 Units by mouth daily. (Patient not taking: Reported on 08/12/2020)    . cyclobenzaprine (FLEXERIL) 10 MG tablet TAKE 1 TABLET BY MOUTH 3 TIMES A DAY 90 tablet 12  . docusate sodium (COLACE) 250 MG capsule Take 250 mg by mouth daily as needed for constipation.     . dutasteride (AVODART) 0.5 MG capsule TAKE 1 CAPSULE DAILY (Patient taking differently: Take 0.5 mg by mouth every morning. ) 90 capsule 3  . esomeprazole (NEXIUM) 40 MG capsule Take 1 capsule (40 mg total) by mouth 2 (two) times daily. 180 capsule 3  . fexofenadine (ALLEGRA) 180 MG tablet Take 180 mg by mouth every morning.     . furosemide (LASIX) 40  MG tablet TAKE 1 TABLET (40 MG) BY MOUTH ONCE DAILY, YOU MAY TAKE 1 EXTRA TABLET (40 MG) AFTER LUNCH AS NEEDED FOR LEG SWELLING/ ABDOMINAL TIGHTNESS 180 tablet 2  . gabapentin (NEURONTIN) 100 MG capsule TAKE 1 CAPSULE BY MOUTH TWICE A DAY (Patient taking differently: Take 100 mg by mouth 2 (two) times daily. ) 180 capsule 2  . hydrochlorothiazide (HYDRODIURIL) 12.5 MG tablet TAKE 2 TABLETS (25 MG TOTAL) BY MOUTH AT  BEDTIME. 180 tablet 1  . Insulin Pen Needle (PEN NEEDLES) 31G X 5 MM MISC 1 each by Does not apply route daily. PATIENT NEEDS NOVA TWIST NEEDLES 5 MM  DX E11.9 200 each 3  . JANUVIA 100 MG tablet TAKE 1 TABLET DAILY (Patient taking differently: Take 100 mg by mouth daily. ) 90 tablet 3  . LANTUS SOLOSTAR 100 UNIT/ML Solostar Pen INJECT 35 UNITS INTO THE   SKIN TWO TIMES A DAY 45 mL 3  . liraglutide (VICTOZA) 18 MG/3ML SOPN INJECT 0.3ML (=1.8MG)      SUBCUTANEOUSLY DAILY (Patient taking differently: Inject 1.8 mg into the skin daily. ) 27 mL 3  . magnesium oxide (MAG-OX) 400 MG tablet TAKE 1 TABLET (400 MG TOTAL) BY MOUTH 2 (TWO) TIMES DAILY. (Patient taking differently: Take 400 mg by mouth 2 (two) times daily. ) 180 tablet 3  . Melatonin Gummies 2.5 MG CHEW Chew 5 mg by mouth at bedtime.     . metoprolol tartrate (LOPRESSOR) 100 MG tablet TAKE 1 TABLET (100 MG TOTAL) BY MOUTH 2 (TWO) TIMES DAILY. 60 tablet 11  . naloxone (NARCAN) 0.4 MG/ML injection INJECT AS NEEDED FOR SUSPECTED OVERDOSE.    . Naloxone HCl 0.4 MG/0.4ML SOAJ Inject as needed for suspected overdose. 0.4 mL 2  . NOVOLOG FLEXPEN 100 UNIT/ML FlexPen INJECT 12 UNITS            SUBCUTANEOUSLY 3 TIMES A   DAY WITH MEALS (Patient taking differently: Inject 14 Units into the skin in the morning and at bedtime. ) 15 mL 5  . NOVOTWIST 32G X 5 MM MISC USE 6 TIMES A DAY DX E11.9 100 each 9  . Omega-3 Fatty Acids (FISH OIL) 1200 MG CAPS Take 1,200 mg by mouth 2 (two) times daily.     Glory Rosebush ULTRA test strip USE WITH METER TWICE DAILY 100 strip 10  . oxyCODONE (OXY IR/ROXICODONE) 5 MG immediate release tablet Take 1-2 tablets (5-10 mg total) by mouth every 4 (four) hours as needed for moderate pain. (Patient not taking: Reported on 08/12/2020) 30 tablet 0  . oxyCODONE (OXYCONTIN) 40 mg 12 hr tablet Take 1 tablet (40 mg total) by mouth every 8 (eight) hours as needed. 270 tablet 0  . pregabalin (LYRICA) 200 MG capsule TAKE 1 CAPSULE 3 TIMES A    DAY (Patient taking differently: Take 200 mg by mouth 3 (three) times daily. ) 270 capsule 1  . rOPINIRole (REQUIP) 1 MG tablet TAKE 1 TO 2 TABLETS BY MOUTH AT BEDTIME AS NEEDED (Patient taking differently: Take 1 mg by mouth in the morning. ) 60 tablet 1  . rOPINIRole (REQUIP) 2 MG tablet Take 1 tablet twice a day as needed. (Patient taking differently: Take 2 mg by mouth at bedtime. ) 60 tablet 5  . sucralfate (CARAFATE) 1 g tablet TAKE 1 TABLET BY MOUTH 4 (FOUR) TIMES DAILY - WITH MEALS AND AT BEDTIME. (Patient taking differently: Take 2 g by mouth 2 (two) times daily. ) 360 tablet 1  .  tamsulosin (FLOMAX) 0.4 MG CAPS capsule Take 0.4 mg by mouth daily. (Patient not taking: Reported on 07/25/2020)    . testosterone cypionate (DEPOTESTOSTERONE CYPIONATE) 200 MG/ML injection Inject 200 mg into the muscle every 21 ( twenty-one) days.      No current facility-administered medications on file prior to visit.   Allergies  Allergen Reactions  . Carisoprodol Itching  . Tamsulosin     Pt stated, "upsets my stomach"    Recent Results (from the past 2160 hour(s))  Protein Electrophoresis, (serum)     Status: None   Collection Time: 05/29/20  9:56 AM  Result Value Ref Range   Total Protein 6.5 6.0 - 8.5 g/dL   Albumin ELP 3.4 2.9 - 4.4 g/dL   Alpha 1 0.2 0.0 - 0.4 g/dL   Alpha 2 0.8 0.4 - 1.0 g/dL   Beta 1.1 0.7 - 1.3 g/dL   Gamma Globulin 1.0 0.4 - 1.8 g/dL   M-Spike, % Not Observed Not Observed g/dL   Globulin, Total 3.1 2.2 - 3.9 g/dL   A/G Ratio 1.1 0.7 - 1.7   Please Note: Comment     Comment: Protein electrophoresis scan will follow via computer, mail, or courier delivery.    Interpretation: Comment     Comment: The SPE pattern appears unremarkable. Evidence of monoclonal protein is not apparent.   Vitamin D (25 hydroxy)     Status: None   Collection Time: 05/29/20  9:56 AM  Result Value Ref Range   Vit D, 25-Hydroxy 40.4 30.0 - 100.0 ng/mL    Comment: Vitamin D deficiency has  been defined by the [place] practice guideline as a level of serum 25-OH vitamin D less than 20 ng/mL (1,2). The Endocrine Society went on to further define vitamin D insufficiency as a level between 21 and 29 ng/mL (2). 1. IOM (Institute of Medicine). 2010. Dietary reference    intakes for calcium and D. [place]: The    [employer]. 2. Holick MF, Binkley [place], Bischoff-Ferrari HA, et al.    Evaluation, treatment, and prevention of vitamin D    deficiency: an Endocrine Society clinical practice    guideline. JCEM. 2011 Jul; 96(7):1911-30.   Calcium, ionized     Status: Abnormal   Collection Time: 05/29/20  9:56 AM  Result Value Ref Range   Calcium, Ion 6.0 (H) 4.5 - 5.6 mg/dL  PTH, Intact and Calcium     Status: Abnormal   Collection Time: 05/29/20  9:56 AM  Result Value Ref Range   Calcium 11.4 (H) 8.6 - 10.2 mg/dL   PTH 41 15 - 65 pg/mL   PTH Interp Comment     Comment: Interpretation                 Intact PTH    Calcium                                 (pg/mL)      (mg/dL) Normal                          15 - 65     8.6 - 10.2 Primary Hyperparathyroidism         >65          >10.2 Secondary Hyperparathyroidism       >65          <  10.2 Non-Parathyroid Hypercalcemia       <65          >10.2 Hypoparathyroidism                  <15          < 8.6 Non-Parathyroid Hypocalcemia    15 - 65          < 8.6   Comprehensive metabolic panel     Status: Abnormal   Collection Time: 06/05/20  2:44 PM  Result Value Ref Range   Sodium 139 135 - 145 mmol/L   Potassium 3.4 (L) 3.5 - 5.1 mmol/L   Chloride 102 98 - 111 mmol/L   CO2 30 22 - 32 mmol/L   Glucose, Bld 179 (H) 70 - 99 mg/dL    Comment: Glucose reference range applies only to samples taken after fasting for at least 8 hours.   BUN 25 (H) 8 - 23 mg/dL   Creatinine, Ser 1.39 (H) 0.61 - 1.24 mg/dL   Calcium 10.4 (H) 8.9 - 10.3 mg/dL   Total Protein 6.6 6.5 - 8.1 g/dL   Albumin  3.5 3.5 - 5.0 g/dL   AST 26 15 - 41 U/L   ALT 28 0 - 44 U/L   Alkaline Phosphatase 93 38 - 126 U/L   Total Bilirubin 0.7 0.3 - 1.2 mg/dL   GFR calc non Af Amer 51 (L) >60 mL/min   GFR calc Af Amer 59 (L) >60 mL/min   Anion gap 7 5 - 15    Comment: Performed at [facility], [place], [place] [address]  Troponin I (High Sensitivity)     Status: None   Collection Time: 06/05/20  2:44 PM  Result Value Ref Range   Troponin I (High Sensitivity) 7 <18 ng/L    Comment: (NOTE) Elevated high sensitivity troponin I (hsTnI) values and significant  changes across serial measurements may suggest ACS but many other  chronic and acute conditions are known to elevate hsTnI results.  Refer to the "Links" section for chest pain algorithms and additional  guidance. Performed at [facility], [address] [place], [place] [address]   Lactic acid, plasma     Status: None   Collection Time: 06/05/20  2:44 PM  Result Value Ref Range   Lactic Acid, Venous 1.9 0.5 - 1.9 mmol/L    Comment: Performed at [facility], [address] [place], [place] [address]  CBC with Differential     Status: Abnormal   Collection Time: 06/05/20  2:44 PM  Result Value Ref Range   WBC 6.2 4.0 - 10.5 K/uL   RBC 5.14 4.22 - 5.81 MIL/uL   Hemoglobin 12.8 (L) 13.0 - 17.0 g/dL   HCT 41.3 39 - 52 %   MCV 80.4 80.0 - 100.0 fL   MCH 24.9 (L) 26.0 - 34.0 pg   MCHC 31.0 30.0 - 36.0 g/dL   RDW 16.9 (H) 11.5 - 15.5 %   Platelets 115 (L) 150 - 400 K/uL    Comment: Immature Platelet Fraction may be clinically indicated, consider ordering this additional test [id number]    nRBC 0.0 0.0 - 0.2 %   Neutrophils Relative % 58 %   Neutro Abs 3.5 1.7 - 7.7 K/uL   Lymphocytes Relative 30 %   Lymphs Abs 1.9 0.7 - 4.0 K/uL   Monocytes Relative 9 %   Monocytes Absolute 0.6 0 - 1 K/uL   Eosinophils Relative  3 %   Eosinophils Absolute 0.2 0 - 0 K/uL   Basophils Relative 0 %   Basophils  Absolute 0.0 0 - 0 K/uL   WBC Morphology MORPHOLOGY UNREMARKABLE    RBC Morphology MORPHOLOGY UNREMARKABLE    Smear Review Normal platelet morphology    Immature Granulocytes 0 %   Abs Immature Granulocytes 0.01 0.00 - 0.07 K/uL    Comment: Performed at [facility], [address] [place], [place] [address]  TSH     Status: Abnormal   Collection Time: 06/05/20  2:44 PM  Result Value Ref Range   TSH 0.034 (L) 0.350 - 4.500 uIU/mL    Comment: Performed by a 3rd Generation assay with a functional sensitivity of <=0.01 uIU/mL. Performed at [facility], [address] [place], [place] [address]   CK     Status: None   Collection Time: 06/05/20  2:44 PM  Result Value Ref Range   Total CK 153 49.0 - 397.0 U/L    Comment: Performed at [facility], [address] [place], [place] [address]  Glucose, capillary     Status: Abnormal   Collection Time: 06/05/20  2:51 PM  Result Value Ref Range   Glucose-Capillary 197 (H) 70 - 99 mg/dL    Comment: Glucose reference range applies only to samples taken after fasting for at least 8 hours.  Lactic acid, plasma     Status: None   Collection Time: 06/05/20  6:49 PM  Result Value Ref Range   Lactic Acid, Venous 1.5 0.5 - 1.9 mmol/L    Comment: Performed at [facility], [place], [place] [address]  Troponin I (High Sensitivity)     Status: None   Collection Time: 06/05/20  6:49 PM  Result Value Ref Range   Troponin I (High Sensitivity) 6 <18 ng/L    Comment: (NOTE) Elevated high sensitivity troponin I (hsTnI) values and significant  changes across serial measurements may suggest ACS but many other  chronic and acute conditions are known to elevate hsTnI results.  Refer to the "Links" section for chest pain algorithms and additional  guidance. Performed at [facility], [address] [place],  [address]   SARS Coronavirus 2 by RT PCR (hospital order, performed in [facility] hospital lab) Nasopharyngeal Nasopharyngeal Swab     Status: None   Collection Time: 06/05/20  8:54 PM   Specimen: Nasopharyngeal Swab  Result Value Ref Range   SARS Coronavirus 2 NEGATIVE NEGATIVE    Comment: (NOTE) SARS-CoV-2 target nucleic acids are NOT DETECTED.  The SARS-CoV-2 RNA is generally detectable in upper and lower respiratory specimens during the acute phase of infection. The lowest concentration of SARS-CoV-2 viral copies this assay can detect is 250 copies / mL. A negative result does not preclude SARS-CoV-2 infection and should not be used as the sole basis for treatment or other patient management decisions.  A negative result may occur with improper specimen collection / handling, submission of specimen other than nasopharyngeal swab, presence of viral mutation(s) within the areas targeted by this assay, and inadequate number of viral copies (<250 copies / mL). A negative result must be combined with clinical observations, patient history, and epidemiological information.  Fact Sheet for Patients:   StrictlyIdeas.no  Fact Sheet for Healthcare Providers: BankingDealers.co.za  This test is not yet approved or  cleared by the [place] FDA and has been authorized for detection and/or diagnosis of SARS-CoV-2 by FDA under  an Emergency Use Authorization (EUA).  This EUA will remain in effect (meaning this test can be used) for the duration of the COVID-19 declaration under Section 564(b)(1) of the Act, 21 U.S.C. section 360bbb-3(b)(1), unless the authorization is terminated or revoked sooner.  Performed at [facility], [address] [place], [place] [address]   Glucose, capillary     Status: Abnormal   Collection Time: 06/05/20 10:15 PM  Result Value Ref Range   Glucose-Capillary 282 (H) 70 - 99 mg/dL    Comment: Glucose reference range applies only to samples taken after fasting for at least 8  hours.  HIV Antibody (routine testing w rflx)     Status: None   Collection Time: 06/06/20  4:30 AM  Result Value Ref Range   HIV Screen 4th Generation wRfx Non Reactive Non Reactive    Comment: Performed at [facility], [address] [place], [place] [address]  Basic metabolic panel     Status: Abnormal   Collection Time: 06/06/20  4:30 AM  Result Value Ref Range   Sodium 141 135 - 145 mmol/L   Potassium 3.7 3.5 - 5.1 mmol/L   Chloride 104 98 - 111 mmol/L   CO2 33 (H) 22 - 32 mmol/L   Glucose, Bld 172 (H) 70 - 99 mg/dL    Comment: Glucose reference range applies only to samples taken after fasting for at least 8 hours.   BUN 21 8 - 23 mg/dL   Creatinine, Ser 1.17 0.61 - 1.24 mg/dL   Calcium 10.2 8.9 - 10.3 mg/dL   GFR calc non Af Amer >60 >60 mL/min   GFR calc Af Amer >60 >60 mL/min   Anion gap 4 (L) 5 - 15    Comment: Performed at [facility], [address] [place], [place] [address]  CBC     Status: Abnormal   Collection Time: 06/06/20  4:30 AM  Result Value Ref Range   WBC 4.9 4.0 - 10.5 K/uL   RBC 4.99 4.22 - 5.81 MIL/uL   Hemoglobin 12.6 (L) 13.0 - 17.0 g/dL   HCT 39.6 39 - 52 %   MCV 79.4 (L) 80.0 - 100.0 fL   MCH 25.3 (L) 26.0 - 34.0 pg   MCHC 31.8 30.0 - 36.0 g/dL   RDW 17.2 (H) 11.5 - 15.5 %   Platelets 97 (L) 150 - 400 K/uL    Comment: Immature Platelet Fraction may be clinically indicated, consider ordering this additional test [id number]    nRBC 0.0 0.0 - 0.2 %    Comment: Performed at [facility], [address] [place], [place] [address]  Glucose, capillary     Status: Abnormal   Collection Time: 06/06/20  8:07 AM  Result Value Ref Range   Glucose-Capillary 158 (H) 70 - 99 mg/dL    Comment: Glucose reference range applies only to samples taken after fasting for at least 8 hours.   Comment 1 Notify RN   Glucose, capillary     Status: Abnormal   Collection Time: 06/06/20 11:43 AM  Result Value Ref Range   Glucose-Capillary  164 (H) 70 - 99 mg/dL    Comment: Glucose reference range applies only to samples taken after fasting for at least 8 hours.   Comment 1 Notify RN   ECHOCARDIOGRAM COMPLETE     Status: None   Collection Time: 06/06/20  1:13 PM  Result Value Ref Range   Weight 4,208 oz   Height 70  in   BP 153/71 mmHg  Glucose, capillary     Status: Abnormal   Collection Time: 06/06/20  3:33 PM  Result Value Ref Range   Glucose-Capillary 197 (H) 70 - 99 mg/dL    Comment: Glucose reference range applies only to samples taken after fasting for at least 8 hours.   Comment 1 Notify Soil scientist Metabolic Panel (BMET)     Status: Abnormal   Collection Time: 06/23/20  1:14 PM  Result Value Ref Range   Sodium 140 135 - 145 mmol/L   Potassium 4.6 3.5 - 5.1 mmol/L   Chloride 101 98 - 111 mmol/L   CO2 31 22 - 32 mmol/L   Glucose, Bld 171 (H) 70 - 99 mg/dL    Comment: Glucose reference range applies only to samples taken after fasting for at least 8 hours.   BUN 23 8 - 23 mg/dL   Creatinine, Ser 1.27 (H) 0.61 - 1.24 mg/dL   Calcium 11.1 (H) 8.9 - 10.3 mg/dL   GFR calc non Af Amer 57 (L) >60 mL/min   GFR calc Af Amer >60 >60 mL/min   Anion gap 8 5 - 15    Comment: Performed at [facility], [address] [place], [place] [address]  Calcium, urine, 24 hour     Status: Abnormal   Collection Time: 06/24/20  1:14 PM  Result Value Ref Range   Calcium, Ur 17.4 Not Estab. mg/dL   Calcium, 24 hour urine 383 (H) 47 - 462 mg/24 hr    Comment: (NOTE) Performed At: [facility] [place], [place] [id number] Rush Farmer MD [id number]    Total Volume 2,200     Comment: Performed at [facility], [address] [place], [place] [address]  Creatinine, urine, 24 hour     Status: None   Collection Time: 06/24/20  1:14 PM  Result Value Ref Range   Urine Total Volume-UCRE24 2,200 mL   Collection Interval-UCRE24 24 hours   Creatinine, Urine 73 mg/dL   Creatinine, 24H Ur  1,606 800 - 2,000 mg/day    Comment: Performed at [facility], [address] [place], [place] [address]  Basic metabolic panel     Status: Abnormal   Collection Time: 07/02/20 11:44 AM  Result Value Ref Range   Glucose 154 (H) 65 - 99 mg/dL   BUN 27 8 - 27 mg/dL   Creatinine, Ser 1.30 (H) 0.76 - 1.27 mg/dL   GFR calc non Af Amer 55 (L) >59 mL/min/1.73   GFR calc Af Amer 64 >59 mL/min/1.73    Comment: **[facility] currently reports eGFR in compliance with the current**   recommendations of the [employer]. [facility] will   update reporting as new guidelines are published from the NKF-ASN   Task force.    BUN/Creatinine Ratio 21 10 - 24   Sodium 143 134 - 144 mmol/L   Potassium 4.8 3.5 - 5.2 mmol/L   Chloride 102 96 - 106 mmol/L   CO2 29 20 - 29 mmol/L   Calcium 11.9 (H) 8.6 - 10.2 mg/dL  CBC w/Diff     Status: Abnormal   Collection Time: 07/02/20 11:44 AM  Result Value Ref Range   WBC 7.4 3.4 - 10.8 x10E3/uL   RBC 5.73 4.14 - 5.80 x10E6/uL   Hemoglobin 14.0 13.0 - 17.7 g/dL   Hematocrit 45.5 37.5 - 51.0 %   MCV 79 79 - 97 fL   MCH 24.4 (  L) 26.6 - 33.0 pg   MCHC 30.8 (L) 31 - 35 g/dL   RDW 16.0 (H) 11.6 - 15.4 %   Platelets 120 (L) 150 - 450 x10E3/uL   Neutrophils 61 Not Estab. %   Lymphs 27 Not Estab. %   Monocytes 9 Not Estab. %   Eos 3 Not Estab. %   Basos 0 Not Estab. %   Neutrophils Absolute 4.5 1 - 7 x10E3/uL   Lymphocytes Absolute 2.0 0 - 3 x10E3/uL   Monocytes Absolute 0.7 0 - 0 x10E3/uL   EOS (ABSOLUTE) 0.2 0.0 - 0.4 x10E3/uL   Basophils Absolute 0.0 0 - 0 x10E3/uL   Immature Granulocytes 0 Not Estab. %   Immature Grans (Abs) 0.0 0.0 - 0.1 x10E3/uL  SARS CORONAVIRUS 2 (TAT 6-24 HRS) Nasopharyngeal Nasopharyngeal Swab     Status: None   Collection Time: 07/23/20 12:30 PM   Specimen: Nasopharyngeal Swab  Result Value Ref Range   SARS Coronavirus 2 NEGATIVE NEGATIVE    Comment: (NOTE) SARS-CoV-2 target nucleic acids are NOT  DETECTED.  The SARS-CoV-2 RNA is generally detectable in upper and lower respiratory specimens during the acute phase of infection. Negative results do not preclude SARS-CoV-2 infection, do not rule out co-infections with other pathogens, and should not be used as the sole basis for treatment or other patient management decisions. Negative results must be combined with clinical observations, patient history, and epidemiological information. The expected result is Negative.  Fact Sheet for Patients: SugarRoll.be  Fact Sheet for Healthcare Providers: https://www.woods-mathews.com/  This test is not yet approved or cleared by the [place] FDA and  has been authorized for detection and/or diagnosis of SARS-CoV-2 by FDA under an Emergency Use Authorization (EUA). This EUA will remain  in effect (meaning this test can be used) for the duration of the COVID-19 declaration under Se ction 564(b)(1) of the Act, 21 U.S.C. section 360bbb-3(b)(1), unless the authorization is terminated or revoked sooner.  Performed at [facility], [address], [place], [place] [address]   Glucose, capillary     Status: Abnormal   Collection Time: 07/25/20  6:55 AM  Result Value Ref Range   Glucose-Capillary 125 (H) 70 - 99 mg/dL    Comment: Glucose reference range applies only to samples taken after fasting for at least 8 hours.  Parathyroid Hormone, Intraop ([facility] only)     Status: Abnormal   Collection Time: 07/25/20  7:15 AM  Result Value Ref Range   Parathyroid Hormone 124 (H) 12 - 88 pg/mL    Comment: READ BACK AND VERIFIED WITH MEGAN LEAMON @0835  07/25/20 MJU Performed at [facility], [address] [place], [place] [address]   Parathyroid Hormone, Intraop [facility] only)     Status: Abnormal   Collection Time: 07/25/20  7:15 AM  Result Value Ref Range   Parathyroid Hormone 115 (H) 12 - 88 pg/mL    Comment: READ BACK AND VERIFIED WITH  MEGAN LEAMON @0835  07/25/20 MJU Performed at [facility], [address] [place], [place] [address]   Parathyroid Hormone, Intraop ([place] only)     Status: Abnormal   Collection Time: 07/25/20  8:28 AM  Result Value Ref Range   Parathyroid Hormone 837 (H) 12 - 88 pg/mL    Comment: READ BACK AND VERIFIED WITH LISA GODWIN @0858  07/25/20 MJU Performed at [facility], [address] [place], [place] [address]   Parathyroid Hormone, Intraop ([facility] only)     Status: None  Collection Time: 07/25/20  8:38 AM  Result Value Ref Range   Parathyroid Hormone 35 12 - 88 pg/mL    Comment: READ BACK AND VERIFIED WITH LISA GODWIN @0905  07/25/20 MJU Performed at [facility], [address] [place], [place] [address]   Parathyroid Hormone, Intraop [facility] only)     Status: None   Collection Time: 07/25/20  8:43 AM  Result Value Ref Range   Parathyroid Hormone 21 12 - 88 pg/mL    Comment: READ BACK AND VERIFIED WITH LISA GODWIN @0910  07/25/20 MJU Performed at [facility], [address] [place], [place] [id number]   Parathyroid Hormone, [facility] only)     Status: None   Collection Time: 07/25/20  8:48 AM  Result Value Ref Range   Parathyroid Hormone 14 12 - 88 pg/mL    Comment: READ BACK AND VERIFIED WITH LISA GODWIN @0916  07/25/20 MJU Performed at [facility], [address], [place], [place] [address]   Surgical pathology     Status: None   Collection Time: 07/25/20  8:51 AM  Result Value Ref Range   SURGICAL PATHOLOGY      SURGICAL PATHOLOGY CASE: [id number] PATIENT: Chrissie Noa Surgical Pathology Report     Specimen Submitted: A. Parathyroid, left inferior  Clinical History: Primary hyperparathyroidism     DIAGNOSIS: A. 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  GROSS DESCRIPTION: A. Labeled: Left inferior parathyroid Received: Formalin Tissue fragment(s):  1 Size: 1 x 0.9 x 0.7 cm; 0.32 g Description: Received is a well-circumscribed red-tan lobulated portion of soft tissue.  The specimen is bisected and entirely submitted in 1 cassette.     Final Diagnosis performed by Bryan Lemma, MD.   Electronically signed 07/28/2020 9:37:39AM The electronic signature indicates that the named Attending Pathologist has evaluated the specimen Technical component performed at [facility], [address] [place], [place] [address] Lab: [phone] Dir: Rush Farmer, MD, MMM  Professional component performed at [facility], [facility], [place], [place], [place] [address] Lab: [phone] Dir: Dellia Nims. Rubinas, MD   Glucose, capillary     Status: Abnormal   Collection Time: 07/25/20  9:42 AM  Result Value Ref Range   Glucose-Capillary 111 (H) 70 - 99 mg/dL    Comment: Glucose reference range applies only to samples taken after fasting for at least 8 hours.  Glucose, capillary     Status: Abnormal   Collection Time: 07/25/20 12:07 PM  Result Value Ref Range   Glucose-Capillary 160 (H) 70 - 99 mg/dL    Comment: Glucose reference range applies only to samples taken after fasting for at least 8 hours.  Calcium     Status: None   Collection Time: 07/25/20  2:10 PM  Result Value Ref Range   Calcium 10.3 8.9 - 10.3 mg/dL    Comment: Performed at [facility], [address] [place], [place] [address]  Albumin     Status: Abnormal   Collection Time: 07/25/20  2:10 PM  Result Value Ref Range   Albumin 3.4 (L) 3.5 - 5.0 g/dL    Comment: Performed at [facility], [address] [place], [place] [address]  Hemoglobin A1c     Status: Abnormal   Collection Time: 07/25/20  2:10 PM  Result Value Ref Range   Hgb A1c MFr Bld 7.6 (H) 4.8 - 5.6 %    Comment: (NOTE)         Prediabetes: 5.7 -  6.4         Diabetes: >6.4         Glycemic control for adults with diabetes: <7.0    Mean Plasma Glucose 171 mg/dL    Comment:  (NOTE) Performed At: [facility] [place], [place] [id number] Rush Farmer MD ZS:[id number]   Glucose, capillary     Status: Abnormal   Collection Time: 07/25/20  4:31 PM  Result Value Ref Range   Glucose-Capillary 293 (H) 70 - 99 mg/dL    Comment: Glucose reference range applies only to samples taken after fasting for at least 8 hours.  Glucose, capillary     Status: Abnormal   Collection Time: 07/25/20  9:45 PM  Result Value Ref Range   Glucose-Capillary 219 (H) 70 - 99 mg/dL    Comment: Glucose reference range applies only to samples taken after fasting for at least 8 hours.  Calcium     Status: None   Collection Time: 07/26/20  5:27 AM  Result Value Ref Range   Calcium 9.8 8.9 - 10.3 mg/dL    Comment: Performed at [facility], [address] [place], [place] [address]  Albumin     Status: None   Collection Time: 07/26/20  5:27 AM  Result Value Ref Range   Albumin 3.5 3.5 - 5.0 g/dL    Comment: Performed at [facility], [address] [place], [place] [address]  Glucose, capillary     Status: Abnormal   Collection Time: 07/26/20  7:55 AM  Result Value Ref Range   Glucose-Capillary 236 (H) 70 - 99 mg/dL    Comment: Glucose reference range applies only to samples taken after fasting for at least 8 hours.  Glucose, capillary     Status: Abnormal   Collection Time: 07/26/20 12:04 PM  Result Value Ref Range   Glucose-Capillary 245 (H) 70 - 99 mg/dL    Comment: Glucose reference range applies only to samples taken after fasting for at least 8 hours.    Objective: General: Patient is awake, alert, and oriented x 3 and in no acute distress.  Integument: Skin is warm, dry and supple bilateral. Nails are tender, long, thickened and dystrophic with subungual debris, consistent with onychomycosis, 1-5 bilateral. No signs of infection. No open lesions or preulcerative lesions present bilateral. Remaining integument unremarkable.  .  Vasculature:  Dorsalis Pedis pulse 0/4 bilateral. Posterior Tibial pulse  0/4 bilateral. Capillary fill time <3 sec 1-5 bilateral. No hair growth to the level of the digits.Temperature gradient within normal limits. No varicosities present bilateral. 1+ pitting edema present bilateral. +Stucco dermatitis due to PVD without infection.   Neurology: The patient has absent sensation measured with a 5.07/10g Semmes Weinstein Monofilament at all pedal sites bilateral, unchanged from prior Musculoskeletal: Asymptomatic hammertoes pedal deformities noted bilateral. No pain to palpation of the left great toe or right ankle. Muscular strength 5/5 in all lower extremity muscular groups bilateral without pain on range of motion. No tenderness with calf compression bilateral.  Assessment and Plan: Problem List Items Addressed This Visit    None    Visit Diagnoses    Pain due to onychomycosis of toenails of both feet    -  Primary   Diabetic polyneuropathy associated with type 2 diabetes mellitus (HCC)       PAD (peripheral artery disease) ([place])         -Examined patient. -Re-Discussed and educated patient on diabetic foot care, especially with  regards to  the vascular, neurological and musculoskeletal systems.  -Mechanically debrided all nails 1-5 bilateral using sterile nail nipper and filed with dremel without incident  -Advised patient to continue with medication for edema control on legs and PCP follow-up for blood glucose control -Answered all patient questions -Patient to return  in 2.5 to 3 months for at risk foot care -Patient advised to call the office if any problems or questions arise in the meantime.  Landis Martins, DPM

## 2020-08-21 ENCOUNTER — Ambulatory Visit: Payer: Medicare Other | Admitting: Sports Medicine

## 2020-08-23 ENCOUNTER — Other Ambulatory Visit: Payer: Self-pay | Admitting: Family Medicine

## 2020-08-23 DIAGNOSIS — G2581 Restless legs syndrome: Secondary | ICD-10-CM

## 2020-08-23 NOTE — Telephone Encounter (Signed)
Requested Prescriptions  Pending Prescriptions Disp Refills  . rOPINIRole (REQUIP) 2 MG tablet [Pharmacy Med Name: ROPINIROLE HCL 2 MG TABLET] 22 tablet 0    Sig: TAKE 1 TABLET BY MOUTH TWICE A DAY AS NEEDED     Neurology:  Parkinsonian Agents Failed - 08/23/2020  8:59 AM      Failed - Last BP in normal range    BP Readings from Last 1 Encounters:  08/12/20 (!) 149/81         Passed - Valid encounter within last 12 months    Recent Outpatient Visits          2 months ago Typical atrial flutter [facility])   [facility] Jerrol Banana., MD   2 months ago Hypercalcemia   [facility] Jerrol Banana., MD   3 months ago Diabetes mellitus type 2, uncontrolled, with complications [facility])   [facility] Jerrol Banana., MD   5 months ago RLS (restless legs syndrome)   [facility] Jerrol Banana., MD   6 months ago Diabetes mellitus type 2, uncontrolled, with complications [facility])   [facility] Jerrol Banana., MD      Future Appointments            In 1 week Gollan, Kathlene November, MD [facility]

## 2020-08-24 ENCOUNTER — Other Ambulatory Visit: Payer: Self-pay | Admitting: Family Medicine

## 2020-08-25 ENCOUNTER — Telehealth: Payer: Self-pay | Admitting: *Deleted

## 2020-08-25 NOTE — Telephone Encounter (Addendum)
PA has been started. Could take up to 5 business days for a reply.

## 2020-08-25 NOTE — Telephone Encounter (Signed)
Copied from [place] [phone]. Topic: General - Other >> Aug 25, 2020 10:17 AM Leward Quan A wrote: Reason for CRM: Patient wife called to say that his Rx for esomeprazole (NEXIUM) 40 MG capsule require prior authorization. Please advise
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
## 2020-09-02 ENCOUNTER — Other Ambulatory Visit: Payer: Self-pay

## 2020-09-02 ENCOUNTER — Encounter: Payer: Self-pay | Admitting: Cardiovascular Disease

## 2020-09-02 ENCOUNTER — Other Ambulatory Visit
Admission: RE | Admit: 2020-09-02 | Discharge: 2020-09-02 | Disposition: A | Discharge status: Home / Self Care | Payer: Medicare Other | Attending: Cardiovascular Disease | Admitting: Cardiovascular Disease

## 2020-09-02 ENCOUNTER — Ambulatory Visit (INDEPENDENT_AMBULATORY_CARE_PROVIDER_SITE_OTHER): Payer: Medicare Other | Admitting: Cardiovascular Disease

## 2020-09-02 VITALS — BP 140/62 | HR 91 | Ht 70.0 in | Wt 260.0 lb

## 2020-09-02 DIAGNOSIS — I480 Paroxysmal atrial fibrillation: Secondary | ICD-10-CM

## 2020-09-02 DIAGNOSIS — I5032 Chronic diastolic (congestive) heart failure: Secondary | ICD-10-CM | POA: Diagnosis not present

## 2020-09-02 DIAGNOSIS — N183 Chronic kidney disease, stage 3 unspecified: Secondary | ICD-10-CM

## 2020-09-02 DIAGNOSIS — E782 Mixed hyperlipidemia: Secondary | ICD-10-CM

## 2020-09-02 DIAGNOSIS — I483 Typical atrial flutter: Secondary | ICD-10-CM

## 2020-09-02 DIAGNOSIS — Z86711 Personal history of pulmonary embolism: Secondary | ICD-10-CM | POA: Diagnosis not present

## 2020-09-02 DIAGNOSIS — I10 Essential (primary) hypertension: Secondary | ICD-10-CM | POA: Diagnosis not present

## 2020-09-02 DIAGNOSIS — E0590 Thyrotoxicosis, unspecified without thyrotoxic crisis or storm: Secondary | ICD-10-CM

## 2020-09-02 LAB — TSH: TSH: 0.145 u[IU]/mL — ABNORMAL LOW (ref 0.350–4.500)

## 2020-09-02 MED ORDER — APIXABAN 5 MG PO TABS: 5.0000 mg | ORAL_TABLET | Freq: Two times a day (BID) | ORAL | 3 refills | Status: DC

## 2020-09-02 NOTE — Patient Instructions (Addendum)
Medication Instructions:  No changes  If you need a refill on your cardiac medications before your next appointment, please call your pharmacy.    Lab work: Your physician recommends that you have lab work today: [facility] entrance - 1st desk on the right to check in (Registration)    If you have labs (blood work) drawn today and your tests are completely normal, you will receive your results only by: [name] MyChart Message (if you have MyChart) OR . A paper copy in the mail If you have any lab test that is abnormal or we need to change your treatment, we will call you to review the results.   Testing/Procedures: No new testing needed   Follow-Up: At [facility], you and your health needs are our priority.  As part of our continuing mission to provide you with exceptional heart care, we have created designated Provider Care Teams.  These Care Teams include your primary Cardiologist (physician) and Advanced Practice Providers (APPs -  Physician Assistants and Nurse Practitioners) who all work together to provide you with the care you need, when you need it.  . You will need a follow up appointment in 3 months  . Providers on your designated Care Team:   . Murray Hodgkins, NP . Christell Faith, PA-C . Marrianne Mood, PA-C  Any Other Special Instructions Will Be Listed Below (If Applicable).  COVID-19 Vaccine Information can be found at: ShippingScam.co.uk For questions related to vaccine distribution or appointments, please email vaccine@[email or web address] or call [phone].

## 2020-09-03 NOTE — Telephone Encounter (Signed)
Have still not received a reply for PA.

## 2020-09-09 ENCOUNTER — Other Ambulatory Visit: Payer: Self-pay

## 2020-09-09 ENCOUNTER — Other Ambulatory Visit
Admission: RE | Admit: 2020-09-09 | Discharge: 2020-09-09 | Disposition: A | Discharge status: Home / Self Care | Payer: Medicare Other | Source: Ambulatory Visit | Attending: Cardiovascular Disease | Admitting: Cardiovascular Disease

## 2020-09-09 ENCOUNTER — Telehealth: Payer: Self-pay | Admitting: *Deleted

## 2020-09-09 ENCOUNTER — Other Ambulatory Visit
Admission: RE | Admit: 2020-09-09 | Discharge: 2020-09-09 | Disposition: A | Discharge status: Home / Self Care | Payer: Medicare Other | Source: Home / Self Care | Attending: Cardiovascular Disease | Admitting: Cardiovascular Disease

## 2020-09-09 DIAGNOSIS — Z20822 Contact with and (suspected) exposure to covid-19: Secondary | ICD-10-CM | POA: Insufficient documentation

## 2020-09-09 DIAGNOSIS — I483 Typical atrial flutter: Secondary | ICD-10-CM | POA: Diagnosis not present

## 2020-09-09 DIAGNOSIS — Z79899 Other long term (current) drug therapy: Secondary | ICD-10-CM | POA: Diagnosis not present

## 2020-09-09 DIAGNOSIS — E291 Testicular hypofunction: Secondary | ICD-10-CM | POA: Diagnosis not present

## 2020-09-09 DIAGNOSIS — N5201 Erectile dysfunction due to arterial insufficiency: Secondary | ICD-10-CM | POA: Diagnosis not present

## 2020-09-09 DIAGNOSIS — N401 Enlarged prostate with lower urinary tract symptoms: Secondary | ICD-10-CM | POA: Diagnosis not present

## 2020-09-09 DIAGNOSIS — Z01812 Encounter for preprocedural laboratory examination: Secondary | ICD-10-CM | POA: Insufficient documentation

## 2020-09-09 DIAGNOSIS — Z01810 Encounter for preprocedural cardiovascular examination: Secondary | ICD-10-CM

## 2020-09-09 LAB — CBC WITH DIFFERENTIAL/PLATELET
Abs Immature Granulocytes: 0.03 10*3/uL (ref 0.00–0.07)
Basophils Absolute: 0 10*3/uL (ref 0.0–0.1)
Basophils Relative: 0 %
Eosinophils Absolute: 0.2 10*3/uL (ref 0.0–0.5)
Eosinophils Relative: 3 %
HCT: 43.1 % (ref 39.0–52.0)
Hemoglobin: 13.6 g/dL (ref 13.0–17.0)
Immature Granulocytes: 0 %
Lymphocytes Relative: 26 %
Lymphs Abs: 2 10*3/uL (ref 0.7–4.0)
MCH: 25.2 pg — ABNORMAL LOW (ref 26.0–34.0)
MCHC: 31.6 g/dL (ref 30.0–36.0)
MCV: 80 fL (ref 80.0–100.0)
Monocytes Absolute: 0.5 10*3/uL (ref 0.1–1.0)
Monocytes Relative: 7 %
Neutro Abs: 4.9 10*3/uL (ref 1.7–7.7)
Neutrophils Relative %: 64 %
Platelets: 146 10*3/uL — ABNORMAL LOW (ref 150–400)
RBC: 5.39 MIL/uL (ref 4.22–5.81)
RDW: 14.3 % (ref 11.5–15.5)
WBC: 7.8 10*3/uL (ref 4.0–10.5)
nRBC: 0 % (ref 0.0–0.2)

## 2020-09-09 LAB — BASIC METABOLIC PANEL
Anion gap: 11 (ref 5–15)
BUN: 21 mg/dL (ref 8–23)
CO2: 31 mmol/L (ref 22–32)
Calcium: 8.3 mg/dL — ABNORMAL LOW (ref 8.9–10.3)
Chloride: 97 mmol/L — ABNORMAL LOW (ref 98–111)
Creatinine, Ser: 1.18 mg/dL (ref 0.61–1.24)
GFR calc Af Amer: >60 mL/min (ref >60)
GFR calc non Af Amer: >60 mL/min (ref >60)
Glucose, Bld: 201 mg/dL — ABNORMAL HIGH (ref 70–99)
Potassium: 3.8 mmol/L (ref 3.5–5.1)
Sodium: 139 mmol/L (ref 135–145)

## 2020-09-09 LAB — SARS CORONAVIRUS 2 (TAT 6-24 HRS): SARS Coronavirus 2: NEGATIVE

## 2020-09-09 NOTE — Telephone Encounter (Signed)
-----   Message from Minna Merritts, MD sent at 09/07/2020  2:50 PM EDT ----- Thyroid lab Some improvement compared to prior number We can set up cardioversion with me for atrial flutter/flutter perhaps September 16 or 17

## 2020-09-09 NOTE — Telephone Encounter (Signed)
Results called to pt and wife. They verbalized understanding of results and plan.  They are agreeable and verbalized understanding of the cardioversion instructions as listed below.  Message sent to precert and Dr Rockey Situ.  You are scheduled for a Cardioversion on ___09/16/21_____ with Dr.__Gollan____ Please arrive at the [facility] at __07:00___ a.m. on the day of your procedure.  DIET INSTRUCTIONS:  Nothing to eat or drink after midnight except your medications with a small sip of water.  Januvia No insulin the morning of.  Take 1/2 dose of Lantus the night before.          1) Labs: ___TODAY_- 9/14/21______  COVID PRE- TEST: You will need a COVID TEST prior to the procedure:  LOCATION: [facility].  DATE/TIME:  Today, September 09, 2020.   2) Medications:  YOU MAY TAKE ALL of your remaining medications with a small amount of water.  3) Must have a responsible person to drive you home.  4) Bring a current list of your medications and current insurance cards.    If you have any questions after you get home, please call the office at [phone]

## 2020-09-10 ENCOUNTER — Other Ambulatory Visit: Payer: Self-pay | Admitting: Cardiovascular Disease

## 2020-09-11 ENCOUNTER — Other Ambulatory Visit: Payer: Self-pay

## 2020-09-11 ENCOUNTER — Ambulatory Visit
Admission: RE | Admit: 2020-09-11 | Discharge: 2020-09-11 | Disposition: A | Discharge status: Home / Self Care | Payer: Medicare Other | Attending: Cardiovascular Disease | Admitting: Cardiovascular Disease

## 2020-09-11 ENCOUNTER — Encounter: Payer: Self-pay | Admitting: Cardiovascular Disease

## 2020-09-11 ENCOUNTER — Encounter
Admission: RE | Disposition: A | Discharge status: Home / Self Care | Payer: Self-pay | Source: Home / Self Care | Attending: Cardiovascular Disease

## 2020-09-11 ENCOUNTER — Ambulatory Visit: Payer: Medicare Other | Admitting: Anesthesiology

## 2020-09-11 DIAGNOSIS — Z96651 Presence of right artificial knee joint: Secondary | ICD-10-CM | POA: Insufficient documentation

## 2020-09-11 DIAGNOSIS — Z9049 Acquired absence of other specified parts of digestive tract: Secondary | ICD-10-CM | POA: Diagnosis not present

## 2020-09-11 DIAGNOSIS — H5440 Blindness, one eye, unspecified eye: Secondary | ICD-10-CM | POA: Diagnosis not present

## 2020-09-11 DIAGNOSIS — Z7982 Long term (current) use of aspirin: Secondary | ICD-10-CM | POA: Insufficient documentation

## 2020-09-11 DIAGNOSIS — K219 Gastro-esophageal reflux disease without esophagitis: Secondary | ICD-10-CM | POA: Insufficient documentation

## 2020-09-11 DIAGNOSIS — Z9842 Cataract extraction status, left eye: Secondary | ICD-10-CM | POA: Insufficient documentation

## 2020-09-11 DIAGNOSIS — Z961 Presence of intraocular lens: Secondary | ICD-10-CM | POA: Diagnosis not present

## 2020-09-11 DIAGNOSIS — Z86718 Personal history of other venous thrombosis and embolism: Secondary | ICD-10-CM | POA: Diagnosis not present

## 2020-09-11 DIAGNOSIS — Z79899 Other long term (current) drug therapy: Secondary | ICD-10-CM | POA: Insufficient documentation

## 2020-09-11 DIAGNOSIS — G4730 Sleep apnea, unspecified: Secondary | ICD-10-CM | POA: Insufficient documentation

## 2020-09-11 DIAGNOSIS — Z87891 Personal history of nicotine dependence: Secondary | ICD-10-CM | POA: Insufficient documentation

## 2020-09-11 DIAGNOSIS — Z8673 Personal history of transient ischemic attack (TIA), and cerebral infarction without residual deficits: Secondary | ICD-10-CM | POA: Diagnosis not present

## 2020-09-11 DIAGNOSIS — I110 Hypertensive heart disease with heart failure: Secondary | ICD-10-CM | POA: Diagnosis not present

## 2020-09-11 DIAGNOSIS — R519 Headache, unspecified: Secondary | ICD-10-CM | POA: Insufficient documentation

## 2020-09-11 DIAGNOSIS — M1990 Unspecified osteoarthritis, unspecified site: Secondary | ICD-10-CM | POA: Insufficient documentation

## 2020-09-11 DIAGNOSIS — Z8249 Family history of ischemic heart disease and other diseases of the circulatory system: Secondary | ICD-10-CM | POA: Insufficient documentation

## 2020-09-11 DIAGNOSIS — N400 Enlarged prostate without lower urinary tract symptoms: Secondary | ICD-10-CM | POA: Insufficient documentation

## 2020-09-11 DIAGNOSIS — E892 Postprocedural hypoparathyroidism: Secondary | ICD-10-CM | POA: Diagnosis not present

## 2020-09-11 DIAGNOSIS — R569 Unspecified convulsions: Secondary | ICD-10-CM | POA: Insufficient documentation

## 2020-09-11 DIAGNOSIS — I483 Typical atrial flutter: Secondary | ICD-10-CM | POA: Insufficient documentation

## 2020-09-11 DIAGNOSIS — Z794 Long term (current) use of insulin: Secondary | ICD-10-CM | POA: Diagnosis not present

## 2020-09-11 DIAGNOSIS — I509 Heart failure, unspecified: Secondary | ICD-10-CM | POA: Insufficient documentation

## 2020-09-11 DIAGNOSIS — R000 Tachycardia, unspecified: Secondary | ICD-10-CM | POA: Insufficient documentation

## 2020-09-11 DIAGNOSIS — Z888 Allergy status to other drugs, medicaments and biological substances status: Secondary | ICD-10-CM | POA: Insufficient documentation

## 2020-09-11 DIAGNOSIS — E785 Hyperlipidemia, unspecified: Secondary | ICD-10-CM | POA: Diagnosis not present

## 2020-09-11 DIAGNOSIS — E1122 Type 2 diabetes mellitus with diabetic chronic kidney disease: Secondary | ICD-10-CM | POA: Diagnosis not present

## 2020-09-11 DIAGNOSIS — I4892 Unspecified atrial flutter: Secondary | ICD-10-CM | POA: Diagnosis not present

## 2020-09-11 DIAGNOSIS — I5032 Chronic diastolic (congestive) heart failure: Secondary | ICD-10-CM | POA: Diagnosis not present

## 2020-09-11 DIAGNOSIS — E7800 Pure hypercholesterolemia, unspecified: Secondary | ICD-10-CM | POA: Diagnosis not present

## 2020-09-11 DIAGNOSIS — E119 Type 2 diabetes mellitus without complications: Secondary | ICD-10-CM | POA: Insufficient documentation

## 2020-09-11 DIAGNOSIS — I480 Paroxysmal atrial fibrillation: Secondary | ICD-10-CM | POA: Insufficient documentation

## 2020-09-11 DIAGNOSIS — G4733 Obstructive sleep apnea (adult) (pediatric): Secondary | ICD-10-CM | POA: Diagnosis not present

## 2020-09-11 HISTORY — PX: CARDIOVERSION: SHX1299

## 2020-09-11 LAB — GLUCOSE, CAPILLARY: Glucose-Capillary: 257 mg/dL — ABNORMAL HIGH (ref 70–99)

## 2020-09-11 SURGERY — CARDIOVERSION
Anesthesia: General

## 2020-09-11 MED ORDER — PROPOFOL 10 MG/ML IV BOLUS
INTRAVENOUS | Status: DC | PRN
  Administered 2020-09-11: 50 mg via INTRAVENOUS

## 2020-09-11 MED ORDER — SODIUM CHLORIDE 0.9 % IV SOLN: INTRAVENOUS | Status: DC | PRN

## 2020-09-11 NOTE — Anesthesia Preprocedure Evaluation (Signed)
Anesthesia Evaluation  Patient identified by MRN, date of birth, ID band Patient awake    Reviewed: Allergy & Precautions, H&P , NPO status , Patient's Chart, lab work & pertinent test results  History of Anesthesia Complications Negative for: history of anesthetic complications  Airway Mallampati: III  TM Distance: <3 FB Neck ROM: limited    Dental  (+) Poor Dentition, Missing, Edentulous Upper, Edentulous Lower   Pulmonary neg shortness of breath, sleep apnea , pneumonia, former smoker,    Pulmonary exam normal        Cardiovascular Exercise Tolerance: Good hypertension, (-) angina+CHF  (-) Past MI Normal cardiovascular exam+ dysrhythmias Atrial Fibrillation      Neuro/Psych  Headaches, Seizures -,  TIA Neuromuscular disease CVA, Residual Symptoms negative psych ROS   GI/Hepatic Neg liver ROS, GERD  Medicated and Controlled,  Endo/Other  diabetes, Type 2, Insulin Dependent  Renal/GU CRFRenal disease  negative genitourinary   Musculoskeletal  (+) Arthritis ,   Abdominal   Peds  Hematology negative hematology ROS (+)   Anesthesia Other Findings Past Medical History: No date: Arrhythmia     Comment:  tachycardia, A-Fib No date: Arthritis 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 No date: BPH (benign prostatic hyperplasia) No date: Diabetes mellitus 02/2010: DVT (deep venous thrombosis) (HCC)     Comment:  leg thrombus ; dislodged into emboli and caused PE No date: Dyspnea No date: Dysrhythmia No date: 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     Comment:  x2 No date: GERD (gastroesophageal reflux disease) No date: Headache     Comment:  h/o as a child No date: Hypercholesteremia No date: Hypertension No date: Kidney failure     Comment:  acute No date: Neuromuscular disorder ([place]) No date: Neuropathy No date: Pneumonia     Comment:  time 63 ;last episode 12/2015 2011: Pulmonary embolus ([place]) No date: Seasonal  allergies No date: Seizures ([place])     Comment:  as child  No date: Sleep apnea     Comment:  BIPAP No date: Stiff neck     Comment:  limited turning s/p titanium plate placement No date: TIA (transient ischemic attack) No date: Wears dentures     Comment:  full upper and lower  Past Surgical History: No date: BACK SURGERY     Comment:  x 8; upper x 3 & lower x 5 03/14/13, 10/16: CARDIOVERSION     Comment:  2014 - [place], 2016 - [place] 10/29/2015: CATARACT EXTRACTION W/PHACO; Left     Comment:  Procedure: CATARACT EXTRACTION PHACO AND INTRAOCULAR               LENS PLACEMENT (IOC);  Surgeon: Leandrew Koyanagi, MD;               Location: [place];  Service: Ophthalmology;                Laterality: Left;  DIABETIC - insulin and oral medsSleep               apnea - no machine No date: CHOLECYSTECTOMY 01/16/2018: COLONOSCOPY WITH PROPOFOL; N/A     Comment:  Procedure: COLONOSCOPY WITH PROPOFOL;  Surgeon: Manya Silvas, MD;  Location: [facility] ENDOSCOPY;  Service:               Endoscopy;  Laterality: N/A; 01/16/2018: ESOPHAGOGASTRODUODENOSCOPY (EGD) WITH PROPOFOL; N/A     Comment:  Procedure: ESOPHAGOGASTRODUODENOSCOPY (EGD)  WITH               PROPOFOL;  Surgeon: Manya Silvas, MD;  Location:               [facility] ENDOSCOPY;  Service: Endoscopy;  Laterality: N/A; No date: EYE SURGERY 2002: GALLBLADDER SURGERY 2018: JOINT REPLACEMENT; Right No date: KNEE ARTHROSCOPY     Comment:  left  07/25/2020: PARATHYROIDECTOMY; N/A     Comment:  Procedure: PARATHYROIDECTOMY;  Surgeon: Fredirick Maudlin, MD;  Location: [facility] ORS;  Service: General;                Laterality: N/A; 2001: [place]:  left  04/26/2018: TEE WITHOUT CARDIOVERSION; N/A     Comment:  Procedure: TRANSESOPHAGEAL ECHOCARDIOGRAM (TEE);                Surgeon: Nelva Bush, MD;  Location: [facility] ORS;                Service: Cardiovascular;  Laterality: N/A; No date:  TONSILLECTOMY 06/20/2017: TOTAL KNEE ARTHROPLASTY; Right     Comment:  Procedure: RIGHT TOTAL KNEE ARTHROPLASTY;  Surgeon:               Gaynelle Arabian, MD;  Location: [facility];  Service:               Orthopedics;  Laterality: Right;     Reproductive/Obstetrics negative OB ROS                             Anesthesia Physical Anesthesia Plan  ASA: III  Anesthesia Plan: General   Post-op Pain Management:    Induction: Intravenous  PONV Risk Score and Plan: Propofol infusion and TIVA  Airway Management Planned: Natural Airway and Nasal Cannula  Additional Equipment:   Intra-op Plan:   Post-operative Plan:   Informed Consent: I have reviewed the patients History and Physical, chart, labs and discussed the procedure including the risks, benefits and alternatives for the proposed anesthesia with the patient or authorized representative who has indicated his/her understanding and acceptance.     Dental Advisory Given  Plan Discussed with: Anesthesiologist, CRNA and Surgeon  Anesthesia Plan Comments: (Patient consented for risks of anesthesia including but not limited to:  - adverse reactions to medications - risk of intubation if required - damage to eyes, teeth, lips or other oral mucosa - nerve damage due to positioning  - sore throat or hoarseness - Damage to heart, brain, nerves, lungs, other parts of body or loss of life  Patient voiced understanding.)        Anesthesia Quick Evaluation

## 2020-09-11 NOTE — CV Procedure (Signed)
Cardioversion procedure note For atrial flutter, typical  Procedure Details:  Consent: Risks of procedure as well as the alternatives and risks of each were explained to the (patient/caregiver). Consent for procedure obtained.  Time Out: Verified patient identification, verified procedure, site/side was marked, verified correct patient position, special equipment/implants available, medications/allergies/relevent history reviewed, required imaging and test results available. Performed  Patient placed on cardiac monitor, pulse oximetry, supplemental oxygen as necessary.  Sedation given: propofol IV, Dr.  Gomez Cleverly pads placed anterior and posterior chest.   Cardioverted 1 time(s).  Cardioverted at  120 J. Synchronized biphasic Converted to NSR   Evaluation: Findings: Post procedure EKG shows: NSR Complications: None Patient did tolerate procedure well.  Time Spent Directly with the Patient:  40 minutes   Esmond Plants, M.D., Ph.D.

## 2020-09-11 NOTE — Anesthesia Postprocedure Evaluation (Signed)
Anesthesia Post Note  Patient: YEIREN WHITECOTTON  Procedure(s) Performed: CARDIOVERSION (N/A )  Patient location during evaluation: Cath Lab Anesthesia Type: General Level of consciousness: awake and alert Pain management: pain level controlled Vital Signs Assessment: post-procedure vital signs reviewed and stable Respiratory status: spontaneous breathing, nonlabored ventilation, respiratory function stable and patient connected to nasal cannula oxygen Cardiovascular status: blood pressure returned to baseline and stable Postop Assessment: no apparent nausea or vomiting Anesthetic complications: no   No complications documented.   Last Vitals:  Vitals:   09/11/20 0805 09/11/20 0815  BP: 131/72 97/82  Pulse: 81 82  Resp: 15 15  SpO2: 96% 97%    Last Pain:  Vitals:   09/11/20 0724  TempSrc: Oral                 Precious Haws Alexzandria Massman

## 2020-09-11 NOTE — Transfer of Care (Signed)
Immediate Anesthesia Transfer of Care Note  Patient: Jerry Parsons  Procedure(s) Performed: CARDIOVERSION (N/A )  Patient Location: PACU and Cath Lab  Anesthesia Type:General  Level of Consciousness: drowsy  Airway & Oxygen Therapy: Patient Spontanous Breathing and Patient connected to nasal cannula oxygen  Post-op Assessment: Report given to RN and Post -op Vital signs reviewed and stable  Post vital signs: Reviewed and stable  Last Vitals:  Vitals Value Taken Time  BP 131/72 09/11/20 0800  Temp    Pulse 85 09/11/20 0806  Resp 16 09/11/20 0806  SpO2 97 % 09/11/20 0806  Vitals shown include unvalidated device data.  Last Pain:  Vitals:   09/11/20 0724  TempSrc: Oral         Complications: No complications documented.

## 2020-09-13 ENCOUNTER — Other Ambulatory Visit: Payer: Self-pay | Admitting: Family Medicine

## 2020-09-13 NOTE — Telephone Encounter (Signed)
Requested Prescriptions  Pending Prescriptions Disp Refills  . rOPINIRole (REQUIP) 1 MG tablet [Pharmacy Med Name: ROPINIROLE HCL 1 MG TABLET] 60 tablet 1    Sig: TAKE 1 TO 2 TABLETS BY MOUTH AT BEDTIME AS NEEDED     Neurology:  Parkinsonian Agents Passed - 09/13/2020  1:30 PM      Passed - Last BP in normal range    BP Readings from Last 1 Encounters:  09/11/20 97/82         Passed - Valid encounter within last 12 months    Recent Outpatient Visits          3 months ago Typical atrial flutter [facility])   [facility] Jerrol Banana., MD   3 months ago Hypercalcemia   [facility] Jerrol Banana., MD   4 months ago Diabetes mellitus type 2, uncontrolled, with complications [facility])   [facility] Jerrol Banana., MD   6 months ago RLS (restless legs syndrome)   [facility] Jerrol Banana., MD   7 months ago Diabetes mellitus type 2, uncontrolled, with complications [facility])   [facility] Jerrol Banana., MD      Future Appointments            In 1 week Gollan, Kathlene November, MD [facility]   In 2 months [place], Kathlene November, MD [facility] [place]

## 2020-09-14 NOTE — H&P (Signed)
H&P Addendum, pre-cardioversion  Patient was seen and evaluated prior to -cardioversion procedure Symptoms, prior testing details again confirmed with the patient Patient examined, no significant change from prior exam Lab work reviewed in detail personally by myself Patient understands risk and benefit of the procedure, willing to proceed  Signed, Tim Kiyra Slaubaugh, MD, Ph.D [facility]  

## 2020-09-14 NOTE — Interval H&P Note (Signed)
History and Physical Interval Note:  09/14/2020 10:22 AM  Jerry Parsons  has presented today for surgery, with the diagnosis of Cardioversion   Atrial Flutter   Dr Zonia Kief 2nd case.  The various methods of treatment have been discussed with the patient and family. After consideration of risks, benefits and other options for treatment, the patient has consented to  Procedure(s): CARDIOVERSION (N/A) as a surgical intervention.  The patient's history has been reviewed, patient examined, no change in status, stable for surgery.  I have reviewed the patient's chart and labs.  Questions were answered to the patient's satisfaction.     Ida Rogue

## 2020-09-17 NOTE — Progress Notes (Signed)
I,Danise Dehne,acting as a scribe for [name], MD.,have documented all relevant documentation on the behalf of [name], MD,as directed by  [name], MD while in the presence of [name], MD.  Annual Wellness Visit     Patient: Jerry Parsons, Male    DOB: 1951/07/10, 69 y.o.   MRN: [id number] Visit Date: 09/18/2020  Today's Provider: [name], MD   Chief Complaint  Patient presents with  . Medicare Wellness   Subjective    Jerry Parsons is a 69 y.o. male who presents today for his Annual Wellness Visit. He reports consuming a general diet. The patient does not participate in regular exercise at present. He generally feels well. He reports sleeping poorly. He does have additional problems to discuss today.   HPI Elevated blood sugars. Chronic pain and neuopathic discomfort.      Medications: Outpatient Medications Prior to Visit  Medication Sig  . amiodarone (PACERONE) 200 MG tablet TAKE 1 TABLET BY MOUTH EVERY DAY (Patient taking differently: Take 200 mg by mouth daily. )  . apixaban (ELIQUIS) 5 MG TABS tablet Take 1 tablet (5 mg total) by mouth 2 (two) times daily.  . cyclobenzaprine (FLEXERIL) 10 MG tablet TAKE 1 TABLET BY MOUTH 3 TIMES A DAY (Patient taking differently: Take 10 mg by mouth 3 (three) times daily. )  . docusate sodium (COLACE) 100 MG capsule Take 300 mg by mouth daily.   [name] dutasteride (AVODART) 0.5 MG capsule TAKE 1 CAPSULE DAILY (Patient taking differently: Take 0.5 mg by mouth daily. )  . esomeprazole (NEXIUM) 40 MG capsule Take 1 capsule (40 mg total) by mouth 2 (two) times daily.  . fexofenadine (ALLEGRA) 180 MG tablet Take 180 mg by mouth daily.   . furosemide (LASIX) 40 MG tablet TAKE 1 TABLET (40 MG) BY MOUTH ONCE DAILY, YOU MAY TAKE 1 EXTRA TABLET (40 MG) AFTER LUNCH AS NEEDED FOR LEG SWELLING/ ABDOMINAL TIGHTNESS (Patient taking differently: Take 40 mg by mouth daily. )  . gabapentin (NEURONTIN)  100 MG capsule TAKE 1 CAPSULE BY MOUTH TWICE A DAY (Patient taking differently: Take 100 mg by mouth 2 (two) times daily. )  . hydrochlorothiazide (HYDRODIURIL) 12.5 MG tablet TAKE 2 TABLETS (25 MG TOTAL) BY MOUTH AT BEDTIME.  . Insulin Pen Needle (PEN NEEDLES) 31G X 5 MM MISC 1 each by Does not apply route daily. PATIENT NEEDS NOVA TWIST NEEDLES 5 MM  DX E11.9  . JANUVIA 100 MG tablet TAKE 1 TABLET DAILY (Patient taking differently: Take 100 mg by mouth daily. )  . LANTUS SOLOSTAR 100 UNIT/ML Solostar Pen INJECT 35 UNITS INTO THE   SKIN TWO TIMES A DAY (Patient taking differently: Inject 38 Units into the skin 2 (two) times daily. )  . liraglutide (VICTOZA) 18 MG/3ML SOPN INJECT 0.3ML (=1.8MG )      SUBCUTANEOUSLY DAILY (Patient taking differently: Inject 1.8 mg into the skin daily. )  . magnesium oxide (MAG-OX) 400 MG tablet TAKE 1 TABLET (400 MG TOTAL) BY MOUTH 2 (TWO) TIMES DAILY. (Patient taking differently: Take 400 mg by mouth 2 (two) times daily. )  . MELATONIN PO Take 2 tablets by mouth at bedtime. Gummies  . metoprolol tartrate (LOPRESSOR) 100 MG tablet TAKE 1 TABLET (100 MG TOTAL) BY MOUTH 2 (TWO) TIMES DAILY.  . Naloxone HCl 0.4 MG/0.4ML SOAJ Inject as needed for suspected overdose. (Patient taking differently: Take 0.4 mg by mouth 1 (one) time orthopaedic injection (Over  dose). )  . NOVOLOG FLEXPEN 100 UNIT/ML FlexPen INJECT 12 UNITS            SUBCUTANEOUSLY 3 TIMES A   DAY WITH MEALS (Patient taking differently: Inject 14 Units into the skin in the morning and at bedtime. )  . NOVOTWIST 32G X 5 MM MISC USE 6 TIMES A DAY DX E11.9  . Omega-3 Fatty Acids (FISH OIL) 1200 MG CAPS Take 1,200 mg by mouth 2 (two) times daily.   Glory Rosebush ULTRA test strip USE WITH METER TWICE DAILY  . oxyCODONE (OXYCONTIN) 40 mg 12 hr tablet Take 1 tablet (40 mg total) by mouth every 8 (eight) hours as needed. (Patient taking differently: Take 40 mg by mouth every 8 (eight) hours as needed (Pain). )  .  pregabalin (LYRICA) 200 MG capsule TAKE 1 CAPSULE 3 TIMES A   DAY (Patient taking differently: Take 200 mg by mouth 3 (three) times daily. )  . rOPINIRole (REQUIP) 1 MG tablet TAKE 1 TO 2 TABLETS BY MOUTH AT BEDTIME AS NEEDED  . rOPINIRole (REQUIP) 2 MG tablet TAKE 1 TABLET BY MOUTH TWICE A DAY AS NEEDED (Patient taking differently: Take 2 mg by mouth at bedtime. )  . sucralfate (CARAFATE) 1 g tablet TAKE 1 TABLET BY MOUTH 4 (FOUR) TIMES DAILY - WITH MEALS AND AT BEDTIME. (Patient taking differently: Take 2 g by mouth 2 (two) times daily. )  . testosterone cypionate (DEPOTESTOSTERONE CYPIONATE) 200 MG/ML injection Inject 200 mg into the muscle every 21 ( twenty-one) days.    No facility-administered medications prior to visit.    Allergies  Allergen Reactions  . Carisoprodol Itching  . Tamsulosin     Pt stated, "upsets my stomach"    Patient Care Team: Jerrol Banana., MD as PCP - General (Unknown Physician Specialty) Rockey Situ, Kathlene November, MD as PCP - Cardiology (Cardiology) Minna Merritts, MD as Consulting Physician (Cardiology) Leandrew Koyanagi, MD as Referring Physician (Ophthalmology) Gaynelle Arabian, MD as Consulting Physician (Orthopedic Surgery) Landis Martins, DPM as Consulting Physician (Podiatry) Royston Cowper, MD as Consulting Physician (Urology) Cathi Roan, [facility] (Pharmacist)  Review of Systems     Objective    Vitals: BP (!) 124/57 (BP Location: Right Arm, Patient Position: Sitting, Cuff Size: Large)   Pulse 75   Temp 98.7 F (37.1 C) (Oral)   Resp 18   Ht 5\' 10"  (1.778 m)   Wt 270 lb (122.5 kg)   SpO2 96%   BMI 38.74 kg/m  BP Readings from Last 3 Encounters:  09/18/20 (!) 124/57  09/11/20 97/82  09/02/20 140/62   Wt Readings from Last 3 Encounters:  09/18/20 270 lb (122.5 kg)  09/11/20 259 lb 14.8 oz (117.9 kg)  09/02/20 260 lb (117.9 kg)      Physical Exam Vitals reviewed.  Constitutional:      Appearance: He is well-developed.  He is obese.  HENT:     Head: Normocephalic and atraumatic.     Right Ear: External ear normal.     Left Ear: External ear normal.     Nose: Nose normal.  Eyes:     General: No scleral icterus.    Conjunctiva/sclera: Conjunctivae normal.  Neck:     Thyroid: No thyromegaly.  Cardiovascular:     Rate and Rhythm: Normal rate and regular rhythm.     Heart sounds: Normal heart sounds.  Pulmonary:     Effort: Pulmonary effort is normal.     Breath sounds:  Normal breath sounds.  Abdominal:     Palpations: Abdomen is soft.  Musculoskeletal:     Right lower leg: Edema present.     Left lower leg: Edema present.     Comments: 1+ LE edema.  Skin:    General: Skin is warm and dry.  Neurological:     Mental Status: He is alert and oriented to person, place, and time. Mental status is at baseline.     Comments: Decreased sensation to monofilament in bilateral anterior feet.  Psychiatric:        Mood and Affect: Mood normal.        Behavior: Behavior normal.        Thought Content: Thought content normal.        Judgment: Judgment normal.      Most recent functional status assessment: In your present state of health, do you have any difficulty performing the following activities: 09/18/2020  Hearing? N  Vision? N  Comment -  Difficulty concentrating or making decisions? N  Walking or climbing stairs? Y  Dressing or bathing? N  Doing errands, shopping? N  Preparing Food and eating ? -  Using the Toilet? -  In the past six months, have you accidently leaked urine? -  Do you have problems with loss of bowel control? -  Managing your Medications? -  Managing your Finances? -  Housekeeping or managing your Housekeeping? -  Some recent data might be hidden   Most recent fall risk assessment: Fall Risk  09/18/2020  Falls in the past year? 1  Comment -  Number falls in past yr: 1  Comment 3  Injury with Fall? 0  Comment -  Risk Factor Category  -  Risk for fall due to : -    Follow up Falls evaluation completed    Most recent depression screenings: PHQ 2/9 Scores 09/18/2020 10/31/2019  PHQ - 2 Score 1 3  PHQ- 9 Score 10 10   Most recent cognitive screening: 6CIT Screen 09/18/2020  What Year? 0 points  What month? 0 points  What time? 0 points  Count back from 20 0 points  Months in reverse 0 points  Repeat phrase 4 points  Total Score 4   Most recent Audit-C alcohol use screening Alcohol Use Disorder Test (AUDIT) 09/18/2020  1. How often do you have a drink containing alcohol? 0  2. How many drinks containing alcohol do you have on a typical day when you are drinking? 0  3. How often do you have six or more drinks on one occasion? 0  AUDIT-C Score 0  Alcohol Brief Interventions/Follow-up AUDIT Score <7 follow-up not indicated   A score of 3 or more in women, and 4 or more in men indicates increased risk for alcohol abuse, EXCEPT if all of the points are from question 1   Results for orders placed or performed in visit on 09/18/20  POCT UA - Microalbumin  Result Value Ref Range   Microalbumin Ur, POC 50 mg/L  POCT urinalysis dipstick  Result Value Ref Range   Color, UA Yellow    Clarity, UA Clear    Glucose, UA Positive (A) Negative   Bilirubin, UA Neg    Ketones, UA Neg    Spec Grav, UA 1.010 1.010 - 1.025   Blood, UA Neg    pH, UA 7.5 5.0 - 8.0   Protein, UA Negative Negative   Urobilinogen, UA 0.2 0.2 or 1.0 E.U./dL   Nitrite, UA  Neg    Leukocytes, UA Negative Negative   Appearance Normal    Odor Normal     Assessment & Plan     Annual wellness visit done today including the all of the following: Reviewed patient's Family Medical History Reviewed and updated list of patient's medical providers Assessment of cognitive impairment was done Assessed patient's functional ability Established a written schedule for health screening [place] Completed and Reviewed  Exercise Activities and Dietary  recommendations Goals    .  Cut out extra servings      Recommend cutting out extra servings at night time.     .  Cut out extra servings      Continue cutting out any extra servings after 8 pm and cutting out all sodas in diet.     .  Diabetes (pt-stated)      Current Barriers:  [name] Knowledge Deficits related to basic Diabetes pathophysiology and self care/management . Knowledge Deficits related to medications used for management of diabetes  Clinical Pharmacist Goal(s):  Over the next 30 days, patient will demonstrate improved adherence to prescribed treatment plan for diabetes self care/management as evidenced by:  [name] Recorded morning blood sugar <200 mg/dL . adherence to ADA/ carb modified diet  Interventions:  . Provided education to patient about basic DM disease process o One month trial of Vitamin B12 for neuropathy . Recommendation to Dr. [name]:  o Discontinue Januvia (DPP4 inhibitor) as not much affect when also taking a GLP1 agonist (Victoza)  o Patient still showing uncontrolled diabetes based on self reported BG, consider adding Jardiance or increasing Lantus o Statin therapy? Last lipid panel in EMR in June 2019 o Possible candidate for CGM  Patient Self Care Activities:  . Self administers insulin as prescribed . Self administers injectable DM medication (Victoza) as prescribed . Checks blood sugars as prescribed and utilize hyper and hypoglycemia protocol as needed . Adheres to prescribed ADA/carb modified  Please see past updates related to this goal by clicking on the "Past Updates" button in the selected goal      .  LIFESTYLE - DECREASE FALLS RISK      Recommend to remove any items from the home that may cause slips or trips.       Immunization History  Administered Date(s) Administered  . Fluad Quad(high Dose 65+) 09/18/2020  . Influenza, High Dose Seasonal PF 09/18/2015, 10/13/2016, 11/03/2017, 10/05/2018, 09/12/2019  . Influenza-Unspecified  08/27/2014  . PFIZER SARS-COV-2 Vaccination 02/04/2020, 02/28/2020  . Pneumococcal Conjugate-13 07/10/2014, 08/31/2017  . Pneumococcal Polysaccharide-23 12/02/1999, 12/30/2011, 10/05/2018  . Td 03/31/2005  . Tdap 08/31/2017  . Zoster 06/30/2014    Health Maintenance  Topic Date Due  . FOOT EXAM  10/22/2020  . HEMOGLOBIN A1C  01/25/2021  . OPHTHALMOLOGY EXAM  02/14/2021  . URINE MICROALBUMIN  09/18/2021  . COLONOSCOPY  01/16/2023  . TETANUS/TDAP  09/01/2027  . INFLUENZA VACCINE  Completed  . COVID-19 Vaccine  Completed  . Hepatitis C Screening  Completed  . PNA vac Low Risk Adult  Completed    Repeat colon 2014. Discussed health benefits of physical activity, and encouraged him to engage in regular exercise appropriate for his age and condition.    .1. Encounter for Medicare annual wellness exam   2. Flank pain Follow for now. Normal exam today - POCT urinalysis dipstick  3. Class 2 severe obesity due to excess calories with serious comorbidity and body mass index (BMI) of 38.0 to 38.9 in adult (  [place])   4. Diabetes mellitus type 2, uncontrolled, with complications (HCC)  - POCT UA - Microalbumin  5. Need for influenza vaccination  - Flu Vaccine QUAD High Dose(Fluad)  6. Typical atrial flutter (HCC) Recent cardioversion.  7. OSA on CPAP   8. Primary osteoarthritis of both knees   9. Spinal stenosis of lumbar region without neurogenic claudication Now back from 3 to 2 oxycodone daily  10. Chronic bilateral low back pain with bilateral sciatica   11. Neuropathy Consider referal to pain clinic.   No follow-ups on file.     I, [name], MD, have reviewed all documentation for this visit. The documentation on 09/21/20 for the exam, diagnosis, procedures, and orders are all accurate and complete.    Richard Cranford Mon, MD  [facility] [phone] (phone) [phone] (fax)  [place]

## 2020-09-18 ENCOUNTER — Encounter: Payer: Self-pay | Admitting: Family Medicine

## 2020-09-18 ENCOUNTER — Ambulatory Visit (INDEPENDENT_AMBULATORY_CARE_PROVIDER_SITE_OTHER): Payer: Medicare Other | Admitting: Family Medicine

## 2020-09-18 ENCOUNTER — Other Ambulatory Visit: Payer: Self-pay

## 2020-09-18 VITALS — BP 124/57 | HR 75 | Temp 98.7°F | Resp 18 | Ht 70.0 in | Wt 270.0 lb

## 2020-09-18 DIAGNOSIS — G629 Polyneuropathy, unspecified: Secondary | ICD-10-CM | POA: Diagnosis not present

## 2020-09-18 DIAGNOSIS — M48061 Spinal stenosis, lumbar region without neurogenic claudication: Secondary | ICD-10-CM

## 2020-09-18 DIAGNOSIS — G4733 Obstructive sleep apnea (adult) (pediatric): Secondary | ICD-10-CM | POA: Diagnosis not present

## 2020-09-18 DIAGNOSIS — M5442 Lumbago with sciatica, left side: Secondary | ICD-10-CM

## 2020-09-18 DIAGNOSIS — Z0000 Encounter for general adult medical examination without abnormal findings: Secondary | ICD-10-CM

## 2020-09-18 DIAGNOSIS — Z23 Encounter for immunization: Secondary | ICD-10-CM | POA: Diagnosis not present

## 2020-09-18 DIAGNOSIS — G8929 Other chronic pain: Secondary | ICD-10-CM

## 2020-09-18 DIAGNOSIS — Z6838 Body mass index (BMI) 38.0-38.9, adult: Secondary | ICD-10-CM

## 2020-09-18 DIAGNOSIS — M170 Bilateral primary osteoarthritis of knee: Secondary | ICD-10-CM | POA: Diagnosis not present

## 2020-09-18 DIAGNOSIS — E1165 Type 2 diabetes mellitus with hyperglycemia: Secondary | ICD-10-CM

## 2020-09-18 DIAGNOSIS — I483 Typical atrial flutter: Secondary | ICD-10-CM | POA: Diagnosis not present

## 2020-09-18 DIAGNOSIS — M5441 Lumbago with sciatica, right side: Secondary | ICD-10-CM

## 2020-09-18 DIAGNOSIS — E118 Type 2 diabetes mellitus with unspecified complications: Secondary | ICD-10-CM

## 2020-09-18 DIAGNOSIS — IMO0002 Reserved for concepts with insufficient information to code with codable children: Secondary | ICD-10-CM

## 2020-09-18 DIAGNOSIS — R109 Unspecified abdominal pain: Secondary | ICD-10-CM | POA: Diagnosis not present

## 2020-09-18 DIAGNOSIS — Z9989 Dependence on other enabling machines and devices: Secondary | ICD-10-CM | POA: Diagnosis not present

## 2020-09-18 LAB — POCT URINALYSIS DIPSTICK
Appearance: NORMAL
Bilirubin, UA: NEGATIVE
Blood, UA: NEGATIVE
Glucose, UA: POSITIVE — AB
Ketones, UA: NEGATIVE
Leukocytes, UA: NEGATIVE
Nitrite, UA: NEGATIVE
Odor: NORMAL
Protein, UA: NEGATIVE
Spec Grav, UA: 1.01 (ref 1.010–1.025)
Urobilinogen, UA: 0.2 E.U./dL
pH, UA: 7.5 (ref 5.0–8.0)

## 2020-09-18 LAB — POCT UA - MICROALBUMIN: Microalbumin Ur, POC: 50 mg/L

## 2020-09-18 NOTE — Patient Instructions (Signed)
Start wearing Knee High Support Hose.

## 2020-09-21 NOTE — Progress Notes (Signed)
Cardiology Office Note  Date:  09/22/2020   ID:  Jerry Parsons, DOB June 14, 1951, MRN [id number]  PCP:  Jerry Banana., MD   Chief Complaint  Patient presents with  . Other    Follow up post Cardioversion - Patient states he has felt up and down since cardioversion. Meds reviewed verbally with patient.     HPI:  Jerry Parsons is a  69 year old gentleman, multiple back surgeries,  spinal stenosis , s/p arthroscopic knee surgery, total knee replacement left obesity,  poorly controlled diabetes ,  DVT, PE in March 2011 previously on warfarin,  30 year smoking history,   neuropathy hypertension,  depression,  renal disease,  obstructive sleep apnea on CPAP,  chronic sweating at nighttime which he attributes to the Vicodin. Remote atrial flutter dating back to 2014, fibrillation, last episode in 2015. Not on anticoagulation presenting for routine followup of his hypertension , atrial flutter   Prior hospitalization hospitalization June 05, 2020  atrial flutter  Underwent cardioversion, 09/11/2020 NSR restored  In follow-up today shortness of breath is dramatically improved Wife who presents with him today reports he is much better, able to exert himself without significant symptoms  Denies significant leg swelling Continues to have muscle cramp in his left chest No regular exercise  Lab work reviewed CR 1.39, BUN 25, TSH 0.034  Chronic back pain 9 back surgeries, previously thought about  spinal cord stimulator  EKG personally reviewed by myself on todays visit NSR rate 71 bpm, no ST or T wave changes  other past medical history reviewed Admission 5/18 for sepsis, salmonella diarrhea, per the discharge summary Hospital records reviewed with the patient in detail From eggs, per the patient He did not receive antibiotics  pneumonia while at the [facility] January 2017 He was in ICU for 3 days, long recovery Lost more than 30 pounds,  Lipid panel done while he  was very ill early 2017, total cholesterol at that time 89  Denies any atrial fibrillation through his hospital course, reports he was changed to amiodarone IV infusion and then back to pill when he was tolerating oral medications  Previous event where he had buckling of his knee, fall, contusion, TIA-type symptoms that took him to the hospital. He was kept overnight and most of his workup was relatively unrevealing including MRI/MRA, carotid ultrasound, echocardiogram.  History of atrial flutter, status post cardioversion on 03/14/2013.   2 admissions to the hospital for gastroenteritis/ campylobacter infection. Required a long course of antibiotics. Possible  infection from tainted chicken.    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  PSH:    Past Surgical History:  Procedure Laterality Date  . BACK SURGERY     x 8; upper x 3 & lower x 5  . CARDIOVERSION  03/14/13, 10/16   2014 - [facility], 2016 - [facility]  . CARDIOVERSION N/A 09/11/2020   Procedure: CARDIOVERSION;  Surgeon: Minna Merritts, MD;  Location: [facility];  Laterality: N/A;  . CATARACT EXTRACTION W/PHACO Left 10/29/2015   Procedure: CATARACT EXTRACTION PHACO AND INTRAOCULAR LENS PLACEMENT ([facility]);  Surgeon: Leandrew Koyanagi, MD;  Location: [facility];  Service: Ophthalmology;  Laterality: Left;  DIABETIC - insulin and oral medsSleep apnea - no machine  . CHOLECYSTECTOMY    . COLONOSCOPY WITH PROPOFOL N/A  01/16/2018   Procedure: COLONOSCOPY WITH PROPOFOL;  Surgeon: Manya Silvas, MD;  Location: [facility];  Service: Parsons;  Laterality: N/A;  . ESOPHAGOGASTRODUODENOSCOPY (EGD) WITH PROPOFOL N/A 01/16/2018   Procedure: ESOPHAGOGASTRODUODENOSCOPY (EGD) WITH PROPOFOL;  Surgeon: Manya Silvas, MD;  Location: [facility];  Service: [facility];  Laterality: N/A;  . EYE SURGERY    . GALLBLADDER SURGERY  2002  . JOINT REPLACEMENT Right 2018  . KNEE ARTHROSCOPY     left   . PARATHYROIDECTOMY N/A 07/25/2020   Procedure: PARATHYROIDECTOMY;  Surgeon: Fredirick Maudlin, MD;  Location: [facility];  Laterality: N/A;  . ROTATOR CUFF REPAIR  2001   left   . TEE WITHOUT CARDIOVERSION N/A 04/26/2018   Procedure: TRANSESOPHAGEAL ECHOCARDIOGRAM (TEE);  Surgeon: Nelva Bush, MD;  Location: [facility];  Laterality: N/A;  . TONSILLECTOMY    . TOTAL KNEE ARTHROPLASTY Right 06/20/2017   Procedure: RIGHT TOTAL KNEE ARTHROPLASTY;  Surgeon: Gaynelle Arabian, MD;  Location: [facility];  Service: Orthopedics;  Laterality: Right;    Current Outpatient Medications  Medication Sig Dispense Refill  . amiodarone (PACERONE) 200 MG tablet TAKE 1 TABLET BY MOUTH EVERY DAY (Patient taking differently: Take 200 mg by mouth daily. ) 90 tablet 0  . apixaban (ELIQUIS) 5 MG TABS tablet Take 1 tablet (5 mg total) by mouth 2 (two) times daily. 180 tablet 3  . cyclobenzaprine (FLEXERIL) 10 MG tablet TAKE 1 TABLET BY MOUTH 3 TIMES A DAY (Patient taking differently: Take 10 mg by mouth 3 (three) times daily. ) 90 tablet 12  . docusate sodium (COLACE) 100 MG capsule Take 300 mg by mouth daily.     [name] dutasteride (AVODART) 0.5 MG capsule TAKE 1 CAPSULE DAILY (Patient taking differently: Take 0.5 mg by mouth daily. ) 90 capsule 3  . esomeprazole (NEXIUM) 40 MG capsule Take 1 capsule (40 mg total) by mouth 2 (two) times daily. 180 capsule 3  . fexofenadine (ALLEGRA) 180 MG tablet Take 180 mg by  mouth daily.     . furosemide (LASIX) 40 MG tablet TAKE 1 TABLET (40 MG) BY MOUTH ONCE DAILY, YOU MAY TAKE 1 EXTRA TABLET (40 MG) AFTER LUNCH AS NEEDED FOR LEG SWELLING/ ABDOMINAL TIGHTNESS (Patient taking differently: Take 40 mg by mouth daily. ) 180 tablet 2  . gabapentin (NEURONTIN) 100 MG capsule TAKE 1 CAPSULE BY MOUTH TWICE A DAY (Patient taking differently: Take 100 mg by mouth 2 (two) times daily. ) 180 capsule 2  . hydrochlorothiazide (HYDRODIURIL) 12.5 MG tablet TAKE 2 TABLETS (25 MG TOTAL) BY MOUTH AT BEDTIME. 180 tablet 1  . Insulin Pen Needle (PEN NEEDLES) 31G X 5 MM MISC 1 each by Does not apply route daily. PATIENT NEEDS NOVA TWIST NEEDLES 5 MM  DX E11.9 200 each 3  . JANUVIA 100 MG tablet TAKE 1 TABLET DAILY (Patient taking differently: Take 100 mg by mouth daily. ) 90 tablet 3  . LANTUS SOLOSTAR 100 UNIT/ML Solostar Pen INJECT 35 UNITS INTO THE   SKIN TWO TIMES A DAY (Patient taking differently: Inject 38 Units into the skin 2 (two) times daily. ) 45 mL 3  . liraglutide (VICTOZA) 18 MG/3ML SOPN INJECT 0.3ML (=1.8MG )      SUBCUTANEOUSLY DAILY (Patient taking differently: Inject 1.8 mg into the skin daily. )  27 mL 3  . magnesium oxide (MAG-OX) 400 MG tablet TAKE 1 TABLET (400 MG TOTAL) BY MOUTH 2 (TWO) TIMES DAILY. (Patient taking differently: Take 400 mg by mouth 2 (two) times daily. ) 180 tablet 3  . MELATONIN PO Take 2 tablets by mouth at bedtime. Gummies    . metoprolol tartrate (LOPRESSOR) 100 MG tablet TAKE 1 TABLET (100 MG TOTAL) BY MOUTH 2 (TWO) TIMES DAILY. 60 tablet 11  . Naloxone HCl 0.4 MG/0.4ML SOAJ Inject as needed for suspected overdose. (Patient taking differently: Take 0.4 mg by mouth 1 (one) time orthopaedic injection (Over dose). ) 0.4 mL 2  . NOVOLOG FLEXPEN 100 UNIT/ML FlexPen INJECT 12 UNITS            SUBCUTANEOUSLY 3 TIMES A   DAY WITH MEALS (Patient taking differently: Inject 14 Units into the skin in the morning and at bedtime. ) 15 mL 5  . NOVOTWIST 32G X 5 MM  MISC USE 6 TIMES A DAY DX E11.9 100 each 9  . Omega-3 Fatty Acids (FISH OIL) 1200 MG CAPS Take 1,200 mg by mouth 2 (two) times daily.     Glory Rosebush ULTRA test strip USE WITH METER TWICE DAILY 100 strip 10  . oxyCODONE (OXYCONTIN) 40 mg 12 hr tablet Take 1 tablet (40 mg total) by mouth every 8 (eight) hours as needed. (Patient taking differently: Take 40 mg by mouth every 8 (eight) hours as needed (Pain). ) 270 tablet 0  . pregabalin (LYRICA) 200 MG capsule TAKE 1 CAPSULE 3 TIMES A   DAY (Patient taking differently: Take 200 mg by mouth 3 (three) times daily. ) 270 capsule 1  . rOPINIRole (REQUIP) 1 MG tablet TAKE 1 TO 2 TABLETS BY MOUTH AT BEDTIME AS NEEDED 60 tablet 1  . rOPINIRole (REQUIP) 2 MG tablet TAKE 1 TABLET BY MOUTH TWICE A DAY AS NEEDED (Patient taking differently: Take 2 mg by mouth at bedtime. ) 22 tablet 0  . sucralfate (CARAFATE) 1 g tablet TAKE 1 TABLET BY MOUTH 4 (FOUR) TIMES DAILY - WITH MEALS AND AT BEDTIME. (Patient taking differently: Take 2 g by mouth 2 (two) times daily. ) 360 tablet 1  . testosterone cypionate (DEPOTESTOSTERONE CYPIONATE) 200 MG/ML injection Inject 200 mg into the muscle every 21 ( twenty-one) days.      No current facility-administered medications for this visit.     Allergies:   Carisoprodol and Tamsulosin   Social History:  The patient  reports that he quit smoking about 30 years ago. His smoking use included cigarettes. He has a 40.00 pack-year smoking history. He has never used smokeless tobacco. He reports that he does not drink alcohol and does not use drugs.   Family History:   family history includes Heart attack in his brother.    Review of Systems: Review of Systems  Constitutional: Negative.   HENT: Negative.   Respiratory: Positive for shortness of breath.   Parsons: Positive for leg swelling.  Gastrointestinal: Negative.   Musculoskeletal: Positive for back pain and joint pain.       Tingling foot pain  Neurological:  Negative.   Psychiatric/Behavioral: Negative.   All other systems reviewed and are negative.   PHYSICAL EXAM: VS:  BP 132/68 (BP Location: Left Arm, Patient Position: Sitting, Cuff Size: Normal)   Pulse 71   Ht 5\' 11"  (1.803 m)   Wt 269 lb (122 kg)   SpO2 94%   BMI 37.52 kg/m  , BMI Body mass  index is 37.52 kg/m. Constitutional:  oriented to person, place, and time. No distress.  HENT:  Head: Grossly normal Eyes:  no discharge. No scleral icterus.  Neck: No JVD, no carotid bruits  Parsons: Regular rate and rhythm, no murmurs appreciated Pulmonary/Chest: Clear to auscultation bilaterally, no wheezes or rails Abdominal: Soft.  no distension.  no tenderness.  Musculoskeletal: Normal range of motion Neurological:  normal muscle tone. Coordination normal. No atrophy Skin: Skin warm and dry Psychiatric: normal affect, pleasant   Recent Labs: 06/05/2020: ALT 28 09/02/2020: TSH 0.145 09/09/2020: BUN 21; Creatinine, Ser 1.18; Hemoglobin 13.6; Platelets 146; Potassium 3.8; Sodium 139    Lipid Panel Lab Results  Component Value Date   CHOL 134 05/15/2020   HDL 29 (L) 05/15/2020   LDLCALC 67 05/15/2020   TRIG 233 (H) 05/15/2020      Wt Readings from Last 3 Encounters:  09/22/20 269 lb (122 kg)  09/18/20 270 lb (122.5 kg)  09/11/20 259 lb 14.8 oz (117.9 kg)     ASSESSMENT AND PLAN:  Atrial flutter, unspecified type ([place]) - Plan: EKG 12-Lead Maintaining normal sinus rhythm, recent cardioversion We will continue anticoagulation, metoprolol, amiodarone  HYPERTENSION, BENIGN -  Blood pressure is well controlled on today's visit. No changes made to the medications.  Paroxysmal atrial fibrillation (HCC) - Plan: EKG 12-Lead On amiodarone and Eliquis With atrial flutter on EKG since June 2021 Shortness of breath leg swelling resolved with maintaining normal sinus rhythm Plan as above  Hyperlipidemia Activity limited by chronic back pain Numbers at goal  Chronic  diastolic CHF (congestive heart failure) (HCC) Recommend compression hose leg elevation Ace wraps  Uncontrolled type 2 diabetes mellitus with complication, with long-term current Unable to exercise, recommended strict low carbohydrate diet  Primary osteoarthritis of both knees Unable to exercise  Neuropathy  living with chronic pain History of fusions   Total encounter time more than 25 minutes  Greater than 50% was spent in counseling and coordination of care with the patient    Orders Placed This Encounter  Procedures  . EKG 12-Lead     Signed, Esmond Plants, M.D., Ph.D. 09/22/2020  [place], [place]

## 2020-09-22 ENCOUNTER — Other Ambulatory Visit: Payer: Self-pay

## 2020-09-22 ENCOUNTER — Ambulatory Visit (INDEPENDENT_AMBULATORY_CARE_PROVIDER_SITE_OTHER): Payer: Medicare Other | Admitting: Cardiovascular Disease

## 2020-09-22 ENCOUNTER — Encounter: Payer: Self-pay | Admitting: Cardiovascular Disease

## 2020-09-22 VITALS — BP 132/68 | HR 71 | Ht 71.0 in | Wt 269.0 lb

## 2020-09-22 DIAGNOSIS — I483 Typical atrial flutter: Secondary | ICD-10-CM | POA: Diagnosis not present

## 2020-09-22 DIAGNOSIS — I480 Paroxysmal atrial fibrillation: Secondary | ICD-10-CM | POA: Diagnosis not present

## 2020-09-22 DIAGNOSIS — I10 Essential (primary) hypertension: Secondary | ICD-10-CM

## 2020-09-22 DIAGNOSIS — E782 Mixed hyperlipidemia: Secondary | ICD-10-CM | POA: Diagnosis not present

## 2020-09-22 DIAGNOSIS — I5032 Chronic diastolic (congestive) heart failure: Secondary | ICD-10-CM

## 2020-09-22 DIAGNOSIS — N183 Chronic kidney disease, stage 3 unspecified: Secondary | ICD-10-CM

## 2020-09-22 NOTE — Patient Instructions (Addendum)
Citracel and miralex   Medication Instructions:  No changes  If you need a refill on your cardiac medications before your next appointment, please call your pharmacy.    Lab work: No new labs needed   If you have labs (blood work) drawn today and your tests are completely normal, you will receive your results only by: [name] MyChart Message (if you have MyChart) OR . A paper copy in the mail If you have any lab test that is abnormal or we need to change your treatment, we will call you to review the results.   Testing/Procedures: No new testing needed   Follow-Up: At [facility], you and your health needs are our priority.  As part of our continuing mission to provide you with exceptional heart care, we have created designated Provider Care Teams.  These Care Teams include your primary Cardiologist (physician) and Advanced Practice Providers (APPs -  Physician Assistants and Nurse Practitioners) who all work together to provide you with the care you need, when you need it.  . You will need a follow up appointment in 6 months  . Providers on your designated Care Team:   . Murray Hodgkins, NP . Christell Faith, PA-C . Marrianne Mood, PA-C  Any Other Special Instructions Will Be Listed Below (If Applicable).  COVID-19 Vaccine Information can be found at: [email or web address].uk For questions related to vaccine distribution or appointments, please email [email or web address] or call [phone].

## 2020-09-23 DIAGNOSIS — E291 Testicular hypofunction: Secondary | ICD-10-CM | POA: Diagnosis not present

## 2020-09-23 DIAGNOSIS — Z79899 Other long term (current) drug therapy: Secondary | ICD-10-CM | POA: Diagnosis not present

## 2020-09-23 DIAGNOSIS — N401 Enlarged prostate with lower urinary tract symptoms: Secondary | ICD-10-CM | POA: Diagnosis not present

## 2020-09-25 ENCOUNTER — Other Ambulatory Visit: Payer: Self-pay | Admitting: Family Medicine

## 2020-09-25 DIAGNOSIS — E1165 Type 2 diabetes mellitus with hyperglycemia: Secondary | ICD-10-CM

## 2020-09-25 DIAGNOSIS — IMO0002 Reserved for concepts with insufficient information to code with codable children: Secondary | ICD-10-CM

## 2020-10-06 ENCOUNTER — Encounter: Payer: Self-pay | Admitting: Family Medicine

## 2020-10-06 ENCOUNTER — Ambulatory Visit (INDEPENDENT_AMBULATORY_CARE_PROVIDER_SITE_OTHER): Payer: Medicare Other | Admitting: Family Medicine

## 2020-10-06 ENCOUNTER — Other Ambulatory Visit: Payer: Self-pay

## 2020-10-06 VITALS — BP 141/65 | HR 79 | Temp 98.7°F | Ht 71.0 in | Wt 264.2 lb

## 2020-10-06 DIAGNOSIS — E1165 Type 2 diabetes mellitus with hyperglycemia: Secondary | ICD-10-CM

## 2020-10-06 DIAGNOSIS — L89159 Pressure ulcer of sacral region, unspecified stage: Secondary | ICD-10-CM | POA: Diagnosis not present

## 2020-10-06 DIAGNOSIS — IMO0002 Reserved for concepts with insufficient information to code with codable children: Secondary | ICD-10-CM

## 2020-10-06 DIAGNOSIS — E118 Type 2 diabetes mellitus with unspecified complications: Secondary | ICD-10-CM

## 2020-10-06 MED ORDER — MUPIROCIN CALCIUM 2 % EX CREA: 1.0000 "application " | TOPICAL_CREAM | Freq: Two times a day (BID) | CUTANEOUS | 0 refills | Status: DC

## 2020-10-06 NOTE — Progress Notes (Signed)
Established patient visit   Patient: Jerry Parsons   DOB: December 20, 1951   69 y.o. Male  MRN: [id number] Visit Date: 10/06/2020  Today's healthcare provider: [name], MD   Chief Complaint  Patient presents with  . Sore between buttocks  . Hyperglycemia   Subjective    HPI  Patient presents today C/O hyperglycemia the past several weeks. He reports his blood sugar has not been under 300. His FBS today was 428 and at 1:50 pm was 459. Patient denies blurred vision, headache and dizziness.  Lab Results  Component Value Date   HGBA1C 7.6 (H) 07/25/2020    Patient C/O a "sore" between top of buttocks. He noticed the area draining approximately 2 weeks ago. He states he then experienced pain. The sore has pus around the outer edges and a black "spot" in the center.       Medications: Outpatient Medications Prior to Visit  Medication Sig  . amiodarone (PACERONE) 200 MG tablet TAKE 1 TABLET BY MOUTH EVERY DAY (Patient taking differently: Take 200 mg by mouth daily. )  . apixaban (ELIQUIS) 5 MG TABS tablet Take 1 tablet (5 mg total) by mouth 2 (two) times daily.  . cyclobenzaprine (FLEXERIL) 10 MG tablet TAKE 1 TABLET BY MOUTH 3 TIMES A DAY (Patient taking differently: Take 10 mg by mouth 3 (three) times daily. )  . docusate sodium (COLACE) 100 MG capsule Take 300 mg by mouth daily.   [name] dutasteride (AVODART) 0.5 MG capsule TAKE 1 CAPSULE DAILY (Patient taking differently: Take 0.5 mg by mouth daily. )  . esomeprazole (NEXIUM) 40 MG capsule Take 1 capsule (40 mg total) by mouth 2 (two) times daily.  . fexofenadine (ALLEGRA) 180 MG tablet Take 180 mg by mouth daily.   . furosemide (LASIX) 40 MG tablet TAKE 1 TABLET (40 MG) BY MOUTH ONCE DAILY, YOU MAY TAKE 1 EXTRA TABLET (40 MG) AFTER LUNCH AS NEEDED FOR LEG SWELLING/ ABDOMINAL TIGHTNESS (Patient taking differently: Take 40 mg by mouth daily. )  . gabapentin (NEURONTIN) 100 MG capsule TAKE 1 CAPSULE BY MOUTH TWICE A DAY  (Patient taking differently: Take 100 mg by mouth 2 (two) times daily. )  . hydrochlorothiazide (HYDRODIURIL) 12.5 MG tablet TAKE 2 TABLETS (25 MG TOTAL) BY MOUTH AT BEDTIME.  . Insulin Pen Needle (PEN NEEDLES) 31G X 5 MM MISC 1 each by Does not apply route daily. PATIENT NEEDS NOVA TWIST NEEDLES 5 MM  DX E11.9  . JANUVIA 100 MG tablet TAKE 1 TABLET DAILY  . LANTUS SOLOSTAR 100 UNIT/ML Solostar Pen INJECT 35 UNITS INTO THE   SKIN TWO TIMES A DAY (Patient taking differently: Inject 38 Units into the skin 2 (two) times daily. )  . liraglutide (VICTOZA) 18 MG/3ML SOPN INJECT 0.3ML (=1.8MG )      SUBCUTANEOUSLY DAILY (Patient taking differently: Inject 1.8 mg into the skin daily. )  . magnesium oxide (MAG-OX) 400 MG tablet TAKE 1 TABLET (400 MG TOTAL) BY MOUTH 2 (TWO) TIMES DAILY. (Patient taking differently: Take 400 mg by mouth 2 (two) times daily. )  . MELATONIN PO Take 2 tablets by mouth at bedtime. Gummies  . metoprolol tartrate (LOPRESSOR) 100 MG tablet TAKE 1 TABLET (100 MG TOTAL) BY MOUTH 2 (TWO) TIMES DAILY.  . Naloxone HCl 0.4 MG/0.4ML SOAJ Inject as needed for suspected overdose. (Patient taking differently: Take 0.4 mg by mouth 1 (one) time orthopaedic injection (Over dose). )  . NOVOLOG FLEXPEN 100 UNIT/ML FlexPen INJECT  12 UNITS            SUBCUTANEOUSLY 3 TIMES A   DAY WITH MEALS (Patient taking differently: Inject 14 Units into the skin in the morning and at bedtime. )  . NOVOTWIST 32G X 5 MM MISC USE 6 TIMES A DAY DX E11.9  . Omega-3 Fatty Acids (FISH OIL) 1200 MG CAPS Take 1,200 mg by mouth 2 (two) times daily.   Glory Rosebush ULTRA test strip USE WITH METER TWICE DAILY  . oxyCODONE (OXYCONTIN) 40 mg 12 hr tablet Take 1 tablet (40 mg total) by mouth every 8 (eight) hours as needed. (Patient taking differently: Take 40 mg by mouth every 8 (eight) hours as needed (Pain). )  . pregabalin (LYRICA) 200 MG capsule TAKE 1 CAPSULE 3 TIMES A   DAY (Patient taking differently: Take 200 mg by mouth 3  (three) times daily. )  . rOPINIRole (REQUIP) 1 MG tablet TAKE 1 TO 2 TABLETS BY MOUTH AT BEDTIME AS NEEDED  . rOPINIRole (REQUIP) 2 MG tablet TAKE 1 TABLET BY MOUTH TWICE A DAY AS NEEDED (Patient taking differently: Take 2 mg by mouth at bedtime. )  . sucralfate (CARAFATE) 1 g tablet TAKE 1 TABLET BY MOUTH 4 (FOUR) TIMES DAILY - WITH MEALS AND AT BEDTIME. (Patient taking differently: Take 2 g by mouth 2 (two) times daily. )  . testosterone cypionate (DEPOTESTOSTERONE CYPIONATE) 200 MG/ML injection Inject 200 mg into the muscle every 21 ( twenty-one) days.    No facility-administered medications prior to visit.    Review of Systems  Constitutional: Negative.   Respiratory: Negative.   Cardiovascular: Negative.   Endocrine:       Hyperglycemia   Skin:       Sore between buttocks       Objective    BP (!) 141/65 (BP Location: Right Arm, Patient Position: Sitting, Cuff Size: Normal)   Pulse 79   Temp 98.7 F (37.1 C) (Oral)   Ht 5\' 11"  (1.803 m)   Wt 264 lb 3.2 oz (119.8 kg)   SpO2 98%   BMI 36.85 kg/m    Physical Exam Vitals and nursing note reviewed.  Constitutional:      Appearance: Normal appearance. He is normal weight.  Eyes:     Conjunctiva/sclera: Conjunctivae normal.  Cardiovascular:     Rate and Rhythm: Normal rate and regular rhythm.     Pulses: Normal pulses.     Heart sounds: Normal heart sounds.  Pulmonary:     Effort: Pulmonary effort is normal.     Breath sounds: Normal breath sounds.  Abdominal:     General: Bowel sounds are normal.  Musculoskeletal:     Cervical back: Normal range of motion and neck supple.     Comments: Walking boot on his foot.  Skin:    General: Skin is warm and dry.     Comments: Olive-sized stage II sacral decubitus  Neurological:     General: No focal deficit present.     Mental Status: He is alert and oriented to person, place, and time.  Psychiatric:        Mood and Affect: Mood normal.        Behavior: Behavior  normal.        Thought Content: Thought content normal.        Judgment: Judgment normal.  No results found for any visits on 10/06/20.  Assessment & Plan    1. Pressure injury of skin of sacral region, unspecified injury stage Stop Neosporin - mupirocin cream (BACTROBAN) 2 %; Apply 1 application topically 2 (two) times daily.  Dispense: 30 g; Refill: 0  2. Diabetes mellitus type 2, uncontrolled, with complications (HCC) Increase Lantus from 38 units to 40 units BID Follow-up 2 to 4 weeks.  Return in about 3 weeks (around 10/27/2020).      I, [name], MD, have reviewed all documentation for this visit. The documentation on 10/11/20 for the exam, diagnosis, procedures, and orders are all accurate and complete.    Jerry Bures Cranford Mon, MD  [facility] [phone] (phone) [phone] (fax)  [place]

## 2020-10-06 NOTE — Patient Instructions (Signed)
Stop Neosporin.  Increase Lantus from 38 units to 40 units twice daily.

## 2020-10-07 DIAGNOSIS — Z6836 Body mass index (BMI) 36.0-36.9, adult: Secondary | ICD-10-CM | POA: Diagnosis not present

## 2020-10-07 DIAGNOSIS — G2581 Restless legs syndrome: Secondary | ICD-10-CM | POA: Diagnosis not present

## 2020-10-07 DIAGNOSIS — E0821 Diabetes mellitus due to underlying condition with diabetic nephropathy: Secondary | ICD-10-CM | POA: Diagnosis not present

## 2020-10-08 ENCOUNTER — Other Ambulatory Visit: Payer: Self-pay | Admitting: Family Medicine

## 2020-10-08 DIAGNOSIS — R110 Nausea: Secondary | ICD-10-CM

## 2020-10-13 ENCOUNTER — Telehealth: Payer: Self-pay

## 2020-10-13 NOTE — Telephone Encounter (Signed)
Copied from [place] [phone]. Topic: General - Other >> Oct 13, 2020  9:49 AM Hinda Lenis D wrote: PT need to speak with a nurse, personal matter / pleas advise

## 2020-10-14 NOTE — Telephone Encounter (Signed)
LMOVM for pt to return call 

## 2020-10-16 ENCOUNTER — Other Ambulatory Visit: Payer: Self-pay | Admitting: Family Medicine

## 2020-10-29 ENCOUNTER — Ambulatory Visit: Payer: Medicare Other | Admitting: Family Medicine

## 2020-10-30 ENCOUNTER — Ambulatory Visit (INDEPENDENT_AMBULATORY_CARE_PROVIDER_SITE_OTHER): Payer: Medicare Other | Admitting: Sports Medicine

## 2020-10-30 ENCOUNTER — Encounter: Payer: Self-pay | Admitting: Sports Medicine

## 2020-10-30 ENCOUNTER — Other Ambulatory Visit: Payer: Self-pay

## 2020-10-30 DIAGNOSIS — I739 Peripheral vascular disease, unspecified: Secondary | ICD-10-CM | POA: Diagnosis not present

## 2020-10-30 DIAGNOSIS — M79674 Pain in right toe(s): Secondary | ICD-10-CM

## 2020-10-30 DIAGNOSIS — M79675 Pain in left toe(s): Secondary | ICD-10-CM

## 2020-10-30 DIAGNOSIS — E1142 Type 2 diabetes mellitus with diabetic polyneuropathy: Secondary | ICD-10-CM | POA: Diagnosis not present

## 2020-10-30 DIAGNOSIS — B351 Tinea unguium: Secondary | ICD-10-CM | POA: Diagnosis not present

## 2020-10-30 DIAGNOSIS — M2040 Other hammer toe(s) (acquired), unspecified foot: Secondary | ICD-10-CM

## 2020-10-30 DIAGNOSIS — L03032 Cellulitis of left toe: Secondary | ICD-10-CM

## 2020-10-30 NOTE — Progress Notes (Signed)
Subjective: Jerry Parsons is a 69 y.o. male patient with history of diabetes who presents to office today complaining of long,mildly painful nails while ambulating in shoes; unable to trim.  Patient states that the glucose has been elevated 2 weeks ago 500. Reports that he has dry skin and has notice a bump on his right great toe but does not hurt and does not recall injury.  Patient is assisted by his wife.   Patient Active Problem List   Diagnosis Date Noted  . S/P parathyroidectomy ([place]) 07/25/2020  . Primary hyperparathyroidism ([place])   . Loss of consciousness ([place]) 06/05/2020  . Osteoarthritis of both knees 11/11/2018  . OSA on CPAP 10/14/2018  . MRSA bacteremia 05/19/2018  . Lumbar spondylosis 05/05/2018  . Spinal stenosis of lumbar region 05/05/2018  . Constipation, chronic 03/08/2018  . Pain in limb 03/03/2018  . Gastroesophageal reflux disease without esophagitis 01/04/2018  . OA (osteoarthritis) of knee 06/20/2017  . Sepsis due to pneumonia ([place]) 05/07/2017  . Neuropathy 10/13/2016  . Amblyopia 12/30/2015  . 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 NS (nuclear sclerosis) 12/30/2015  . Pseudoaphakia 12/30/2015  . Dehydration 09/08/2015  . Aspiration pneumonia ([place]) 07/04/2015  . Paroxysmal atrial fibrillation ([place]) 07/04/2015  . Arthritis of knee, degenerative 05/28/2015  . Chronic back pain 09/17/2014  . Leg edema 05/07/2014  . Chronic diastolic CHF (congestive heart failure) ([place]) 05/07/2014  . Campylobacter diarrhea 04/25/2013  . Atrial flutter ([place]) 01/23/2013  . Obesity 05/19/2012  . Hyperlipidemia 11/11/2011  . Diastolic dysfunction 84/16/6063  . SOB (shortness of breath) 04/12/2011  . Diabetes mellitus type 2, uncontrolled, with complications ([place]) 01/60/1093  . HYPERTENSION, BENIGN 03/15/2011  . DVT 03/15/2011  . TACHYCARDIA 03/15/2011   Current Outpatient Medications on File Prior to Visit  Medication Sig Dispense Refill  . amiodarone (PACERONE) 200 MG tablet TAKE 1  TABLET BY MOUTH EVERY DAY (Patient taking differently: Take 200 mg by mouth daily. ) 90 tablet 0  . apixaban (ELIQUIS) 5 MG TABS tablet Take 1 tablet (5 mg total) by mouth 2 (two) times daily. 180 tablet 3  . cyclobenzaprine (FLEXERIL) 10 MG tablet TAKE 1 TABLET BY MOUTH 3 TIMES A DAY (Patient taking differently: Take 10 mg by mouth 3 (three) times daily. ) 90 tablet 12  . docusate sodium (COLACE) 100 MG capsule Take 300 mg by mouth daily.     [name] dutasteride (AVODART) 0.5 MG capsule TAKE 1 CAPSULE DAILY (Patient taking differently: Take 0.5 mg by mouth daily. ) 90 capsule 3  . esomeprazole (NEXIUM) 40 MG capsule Take 1 capsule (40 mg total) by mouth 2 (two) times daily. 180 capsule 3  . ferrous sulfate 325 (65 FE) MG tablet Take by mouth.    . fexofenadine (ALLEGRA) 180 MG tablet Take 180 mg by mouth daily.     . furosemide (LASIX) 40 MG tablet TAKE 1 TABLET (40 MG) BY MOUTH ONCE DAILY, YOU MAY TAKE 1 EXTRA TABLET (40 MG) AFTER LUNCH AS NEEDED FOR LEG SWELLING/ ABDOMINAL TIGHTNESS (Patient taking differently: Take 40 mg by mouth daily. ) 180 tablet 2  . gabapentin (NEURONTIN) 100 MG capsule TAKE 1 CAPSULE BY MOUTH TWICE A DAY (Patient taking differently: Take 100 mg by mouth 2 (two) times daily. ) 180 capsule 2  . hydrochlorothiazide (HYDRODIURIL) 12.5 MG tablet TAKE 2 TABLETS (25 MG TOTAL) BY MOUTH AT BEDTIME. 180 tablet 1  . Insulin Pen Needle (PEN NEEDLES) 31G X 5 MM MISC 1 each by Does not apply  route daily. PATIENT NEEDS NOVA TWIST NEEDLES 5 MM  DX E11.9 200 each 3  . JANUVIA 100 MG tablet TAKE 1 TABLET DAILY 90 tablet 3  . LANTUS SOLOSTAR 100 UNIT/ML Solostar Pen INJECT 35 UNITS INTO THE   SKIN TWO TIMES A DAY (Patient taking differently: Inject 38 Units into the skin 2 (two) times daily. ) 45 mL 3  . liraglutide (VICTOZA) 18 MG/3ML SOPN INJECT 0.3ML (=1.8MG )      SUBCUTANEOUSLY DAILY (Patient taking differently: Inject 1.8 mg into the skin daily. ) 27 mL 3  . magnesium oxide (MAG-OX) 400 MG  tablet TAKE 1 TABLET (400 MG TOTAL) BY MOUTH 2 (TWO) TIMES DAILY. (Patient taking differently: Take 400 mg by mouth 2 (two) times daily. ) 180 tablet 3  . MELATONIN PO Take 2 tablets by mouth at bedtime. Gummies    . metoprolol tartrate (LOPRESSOR) 100 MG tablet TAKE 1 TABLET (100 MG TOTAL) BY MOUTH 2 (TWO) TIMES DAILY. 60 tablet 11  . mupirocin cream (BACTROBAN) 2 % Apply 1 application topically 2 (two) times daily. 30 g 0  . Naloxone HCl 0.4 MG/0.4ML SOAJ Inject as needed for suspected overdose. (Patient taking differently: Take 0.4 mg by mouth 1 (one) time orthopaedic injection (Over dose). ) 0.4 mL 2  . NOVOLOG FLEXPEN 100 UNIT/ML FlexPen INJECT 12 UNITS            SUBCUTANEOUSLY 3 TIMES A   DAY WITH MEALS (Patient taking differently: Inject 14 Units into the skin in the morning and at bedtime. ) 15 mL 5  . NOVOTWIST 32G X 5 MM MISC USE 6 TIMES A DAY DX E11.9 100 each 9  . Omega-3 Fatty Acids (FISH OIL) 1200 MG CAPS Take 1,200 mg by mouth 2 (two) times daily.     Glory Rosebush ULTRA test strip USE WITH METER TWICE DAILY 100 strip 10  . oxyCODONE (OXYCONTIN) 40 mg 12 hr tablet Take 1 tablet (40 mg total) by mouth every 8 (eight) hours as needed. (Patient taking differently: Take 40 mg by mouth every 8 (eight) hours as needed (Pain). ) 270 tablet 0  . pregabalin (LYRICA) 200 MG capsule TAKE 1 CAPSULE 3 TIMES A   DAY (Patient taking differently: Take 200 mg by mouth 3 (three) times daily. ) 270 capsule 1  . rOPINIRole (REQUIP) 1 MG tablet TAKE 1 TO 2 TABLETS BY MOUTH AT BEDTIME AS NEEDED 60 tablet 1  . rOPINIRole (REQUIP) 2 MG tablet TAKE 1 TABLET BY MOUTH TWICE A DAY AS NEEDED (Patient taking differently: Take 2 mg by mouth at bedtime. ) 22 tablet 0  . sucralfate (CARAFATE) 1 g tablet TAKE 1 TABLET BY MOUTH 4 (FOUR) TIMES DAILY - WITH MEALS AND AT BEDTIME. 360 tablet 1  . tamsulosin (FLOMAX) 0.4 MG CAPS capsule Take 0.4 mg by mouth daily.    [name] testosterone cypionate (DEPOTESTOSTERONE CYPIONATE) 200  MG/ML injection Inject 200 mg into the muscle every 21 ( twenty-one) days.      No current facility-administered medications on file prior to visit.   Allergies  Allergen Reactions  . Carisoprodol Itching  . Tamsulosin     Pt stated, "upsets my stomach"    Objective: General: Patient is awake, alert, and oriented x 3 and in no acute distress.  Integument: Skin is warm, dry and supple bilateral. Nails are tender, long, thickened and dystrophic with subungual debris, consistent with onychomycosis, 1-5 bilateral. There is bloody drainage at left 1st toenail, no swelling, no  redness, No open lesions or preulcerative lesions present bilateral. Remaining integument unremarkable. .  Vasculature:  Dorsalis Pedis pulse 0/4 bilateral. Posterior Tibial pulse  0/4 bilateral. Capillary fill time <3 sec 1-5 bilateral. No hair growth to the level of the digits.Temperature gradient within normal limits. No varicosities present bilateral. 1+ pitting edema present bilateral. +Stucco dermatitis due to PVD without infection.   Neurology: The patient has absent sensation measured with a 5.07/10g Semmes Weinstein Monofilament at all pedal sites bilateral, unchanged from prior  Musculoskeletal: Asymptomatic hammertoes pedal deformities noted bilateral R>L. No pain to palpation of the left great toe or right ankle. Muscular strength 5/5 in all lower extremity muscular groups bilateral without pain on range of motion. No tenderness with calf compression bilateral.  Assessment and Plan: Problem List Items Addressed This Visit    None    Visit Diagnoses    Pain due to onychomycosis of toenails of both feet    -  Primary   Paronychia of fourth toe, left       Diabetic polyneuropathy associated with type 2 diabetes mellitus (HCC)       PAD (peripheral artery disease) (HCC)       Hammer toe, unspecified laterality         -Examined patient. -Re-Discussed and educated patient on diabetic foot care, especially  with  regards to the vascular, neurological and musculoskeletal systems.  -Mechanically debrided all nails 1-5 bilateral using sterile nail nipper and filed with dremel without incident  -Applied betadine and bandaid dressing to left 1st toenail and advised patient to do the same daily and soak with epsom salt for 1 week -Advised patient to continue with foot miracle cream for dry skin -Continue with supportive shoes that do not rub toes -Answered all patient questions -Patient to return  in 2.5 to 3 months for at risk foot care or sooner if problems arise or if left 1st toe fails to improve. -Patient advised to call the office if any problems or questions arise in the meantime.  Landis Martins, DPM

## 2020-11-04 ENCOUNTER — Other Ambulatory Visit: Payer: Self-pay

## 2020-11-04 ENCOUNTER — Encounter: Payer: Self-pay | Admitting: Family Medicine

## 2020-11-04 ENCOUNTER — Ambulatory Visit (INDEPENDENT_AMBULATORY_CARE_PROVIDER_SITE_OTHER): Payer: Medicare Other | Admitting: Family Medicine

## 2020-11-04 VITALS — BP 126/70 | HR 68 | Temp 98.0°F | Resp 16 | Ht 71.0 in | Wt 270.0 lb

## 2020-11-04 DIAGNOSIS — M170 Bilateral primary osteoarthritis of knee: Secondary | ICD-10-CM

## 2020-11-04 DIAGNOSIS — M5441 Lumbago with sciatica, right side: Secondary | ICD-10-CM | POA: Diagnosis not present

## 2020-11-04 DIAGNOSIS — IMO0002 Reserved for concepts with insufficient information to code with codable children: Secondary | ICD-10-CM

## 2020-11-04 DIAGNOSIS — E1165 Type 2 diabetes mellitus with hyperglycemia: Secondary | ICD-10-CM

## 2020-11-04 DIAGNOSIS — M5442 Lumbago with sciatica, left side: Secondary | ICD-10-CM

## 2020-11-04 DIAGNOSIS — Z9989 Dependence on other enabling machines and devices: Secondary | ICD-10-CM

## 2020-11-04 DIAGNOSIS — Z6838 Body mass index (BMI) 38.0-38.9, adult: Secondary | ICD-10-CM

## 2020-11-04 DIAGNOSIS — G4733 Obstructive sleep apnea (adult) (pediatric): Secondary | ICD-10-CM | POA: Diagnosis not present

## 2020-11-04 DIAGNOSIS — E118 Type 2 diabetes mellitus with unspecified complications: Secondary | ICD-10-CM

## 2020-11-04 DIAGNOSIS — E892 Postprocedural hypoparathyroidism: Secondary | ICD-10-CM | POA: Diagnosis not present

## 2020-11-04 DIAGNOSIS — M48061 Spinal stenosis, lumbar region without neurogenic claudication: Secondary | ICD-10-CM | POA: Diagnosis not present

## 2020-11-04 DIAGNOSIS — G8929 Other chronic pain: Secondary | ICD-10-CM

## 2020-11-04 DIAGNOSIS — I480 Paroxysmal atrial fibrillation: Secondary | ICD-10-CM | POA: Diagnosis not present

## 2020-11-04 DIAGNOSIS — M47816 Spondylosis without myelopathy or radiculopathy, lumbar region: Secondary | ICD-10-CM

## 2020-11-04 DIAGNOSIS — M79605 Pain in left leg: Secondary | ICD-10-CM

## 2020-11-04 MED ORDER — DAPAGLIFLOZIN PROPANEDIOL 10 MG PO TABS: 10.0000 mg | ORAL_TABLET | Freq: Every day | ORAL | 2 refills | Status: DC

## 2020-11-04 NOTE — Patient Instructions (Addendum)
Stop Furosemide

## 2020-11-04 NOTE — Progress Notes (Signed)
Established patient visit   Patient: Jerry Parsons   DOB: 1951-11-12   69 y.o. Male  MRN: [id number] Visit Date: 11/04/2020  Today's healthcare provider: [name], MD   Chief Complaint  Patient presents with  . Diabetes  . Hypertension  . Pressure Ulcer   Subjective    HPI  Patient has having worsening problems with back pain and sciatica down his left leg. His diabetic control is not been good recently. Diabetes Mellitus Type II, follow-up   Lab Results  Component Value Date   HGBA1C 7.6 (H) 07/25/2020   HGBA1C 7.7 (H) 05/15/2020   HGBA1C 9.2 (A) 02/11/2020   Last seen for diabetes 3 months ago.  Management since then includes; on 10/06/2020 Increased Lantus from 38 units to 40 units BID. He reports good compliance with treatment. He is not having side effects.   Home blood sugar records: fasting range: 160s  Episodes of hypoglycemia? No    Current insulin regiment: Lantus 40 units BID Most Recent Eye Exam: up to date  Hypertension, follow-up  BP Readings from Last 3 Encounters:  11/04/20 126/70  10/06/20 (!) 141/65  09/22/20 132/68   Wt Readings from Last 3 Encounters:  11/04/20 270 lb (122.5 kg)  10/06/20 264 lb 3.2 oz (119.8 kg)  09/22/20 269 lb (122 kg)     He was last seen for hypertension 1 months ago.  BP at that visit was 141/65. Management since that visit includes; om metoprolol and HCTZ. He reports good compliance with treatment. He is not having side effects.  He is not exercising. He is adherent to low salt diet.   Outside blood pressures are checked occasionally.  He does not smoke.  Use of agents associated with hypertension: none.    Pressure injury of skin of sacral region, unspecified injury stage From 10/06/2020-Stop Neosporin.      Medications: Outpatient Medications Prior to Visit  Medication Sig  . amiodarone (PACERONE) 200 MG tablet TAKE 1 TABLET BY MOUTH EVERY DAY (Patient taking differently: Take 200  mg by mouth daily. )  . apixaban (ELIQUIS) 5 MG TABS tablet Take 1 tablet (5 mg total) by mouth 2 (two) times daily.  . cyclobenzaprine (FLEXERIL) 10 MG tablet TAKE 1 TABLET BY MOUTH 3 TIMES A DAY (Patient taking differently: Take 10 mg by mouth 3 (three) times daily. )  . docusate sodium (COLACE) 100 MG capsule Take 300 mg by mouth daily.   [name] dutasteride (AVODART) 0.5 MG capsule TAKE 1 CAPSULE DAILY (Patient taking differently: Take 0.5 mg by mouth daily. )  . esomeprazole (NEXIUM) 40 MG capsule Take 1 capsule (40 mg total) by mouth 2 (two) times daily.  . ferrous sulfate 325 (65 FE) MG tablet Take by mouth.  . fexofenadine (ALLEGRA) 180 MG tablet Take 180 mg by mouth daily.   . furosemide (LASIX) 40 MG tablet TAKE 1 TABLET (40 MG) BY MOUTH ONCE DAILY, YOU MAY TAKE 1 EXTRA TABLET (40 MG) AFTER LUNCH AS NEEDED FOR LEG SWELLING/ ABDOMINAL TIGHTNESS (Patient taking differently: Take 40 mg by mouth daily. )  . gabapentin (NEURONTIN) 100 MG capsule TAKE 1 CAPSULE BY MOUTH TWICE A DAY (Patient taking differently: Take 100 mg by mouth 2 (two) times daily. )  . hydrochlorothiazide (HYDRODIURIL) 12.5 MG tablet TAKE 2 TABLETS (25 MG TOTAL) BY MOUTH AT BEDTIME.  . Insulin Pen Needle (PEN NEEDLES) 31G X 5 MM MISC 1 each by Does not apply route daily. PATIENT NEEDS NOVA  TWIST NEEDLES 5 MM  DX E11.9  . JANUVIA 100 MG tablet TAKE 1 TABLET DAILY  . LANTUS SOLOSTAR 100 UNIT/ML Solostar Pen INJECT 35 UNITS INTO THE   SKIN TWO TIMES A DAY (Patient taking differently: Inject 38 Units into the skin 2 (two) times daily. )  . liraglutide (VICTOZA) 18 MG/3ML SOPN INJECT 0.3ML (=1.8MG )      SUBCUTANEOUSLY DAILY (Patient taking differently: Inject 1.8 mg into the skin daily. )  . magnesium oxide (MAG-OX) 400 MG tablet TAKE 1 TABLET (400 MG TOTAL) BY MOUTH 2 (TWO) TIMES DAILY. (Patient taking differently: Take 400 mg by mouth 2 (two) times daily. )  . MELATONIN PO Take 2 tablets by mouth at bedtime. Gummies  . metoprolol  tartrate (LOPRESSOR) 100 MG tablet TAKE 1 TABLET (100 MG TOTAL) BY MOUTH 2 (TWO) TIMES DAILY.  . mupirocin cream (BACTROBAN) 2 % Apply 1 application topically 2 (two) times daily.  . Naloxone HCl 0.4 MG/0.4ML SOAJ Inject as needed for suspected overdose. (Patient taking differently: Take 0.4 mg by mouth 1 (one) time orthopaedic injection (Over dose). )  . NOVOLOG FLEXPEN 100 UNIT/ML FlexPen INJECT 12 UNITS            SUBCUTANEOUSLY 3 TIMES A   DAY WITH MEALS (Patient taking differently: Inject 14 Units into the skin in the morning and at bedtime. )  . NOVOTWIST 32G X 5 MM MISC USE 6 TIMES A DAY DX E11.9  . Omega-3 Fatty Acids (FISH OIL) 1200 MG CAPS Take 1,200 mg by mouth 2 (two) times daily.   Glory Rosebush ULTRA test strip USE WITH METER TWICE DAILY  . oxyCODONE (OXYCONTIN) 40 mg 12 hr tablet Take 1 tablet (40 mg total) by mouth every 8 (eight) hours as needed. (Patient taking differently: Take 40 mg by mouth every 8 (eight) hours as needed (Pain). )  . pregabalin (LYRICA) 200 MG capsule TAKE 1 CAPSULE 3 TIMES A   DAY (Patient taking differently: Take 200 mg by mouth 3 (three) times daily. )  . rOPINIRole (REQUIP) 1 MG tablet TAKE 1 TO 2 TABLETS BY MOUTH AT BEDTIME AS NEEDED  . rOPINIRole (REQUIP) 2 MG tablet TAKE 1 TABLET BY MOUTH TWICE A DAY AS NEEDED (Patient taking differently: Take 2 mg by mouth at bedtime. )  . sucralfate (CARAFATE) 1 g tablet TAKE 1 TABLET BY MOUTH 4 (FOUR) TIMES DAILY - WITH MEALS AND AT BEDTIME.  . tamsulosin (FLOMAX) 0.4 MG CAPS capsule Take 0.4 mg by mouth daily.  [name] testosterone cypionate (DEPOTESTOSTERONE CYPIONATE) 200 MG/ML injection Inject 200 mg into the muscle every 21 ( twenty-one) days.    No facility-administered medications prior to visit.    Review of Systems  Constitutional: Negative for appetite change, chills and fever.  Respiratory: Negative for chest tightness, shortness of breath and wheezing.   Cardiovascular: Negative for chest pain and palpitations.   Gastrointestinal: Negative for abdominal pain, nausea and vomiting.    Last hemoglobin A1c Lab Results  Component Value Date   HGBA1C 7.6 (H) 07/25/2020      Objective    BP 126/70 (BP Location: Right Arm)   Pulse 68   Temp 98 F (36.7 C)   Resp 16   Ht 5\' 11"  (1.803 m)   Wt 270 lb (122.5 kg)   BMI 37.66 kg/m  BP Readings from Last 3 Encounters:  11/04/20 126/70  10/06/20 (!) 141/65  09/22/20 132/68   Wt Readings from Last 3 Encounters:  11/04/20 270  lb (122.5 kg)  10/06/20 264 lb 3.2 oz (119.8 kg)  09/22/20 269 lb (122 kg)      Physical Exam Vitals reviewed.  Constitutional:      Appearance: He is well-developed. He is obese.  HENT:     Head: Normocephalic and atraumatic.     Right Ear: External ear normal.     Left Ear: External ear normal.     Nose: Nose normal.  Eyes:     General: No scleral icterus.    Conjunctiva/sclera: Conjunctivae normal.  Neck:     Thyroid: No thyromegaly.  Cardiovascular:     Rate and Rhythm: Normal rate and regular rhythm.     Heart sounds: Normal heart sounds.  Pulmonary:     Effort: Pulmonary effort is normal.     Breath sounds: Normal breath sounds.  Abdominal:     Palpations: Abdomen is soft.  Musculoskeletal:     Right lower leg: Edema present.     Left lower leg: Edema present.     Comments: 1+ LE edema.  Skin:    General: Skin is warm and dry.  Neurological:     Mental Status: He is alert and oriented to person, place, and time. Mental status is at baseline.     Comments: Decreased sensation to monofilament in bilateral anterior feet. He has straight leg raise is positive on the left. Also he is 4+/5 strength in the left leg  Psychiatric:        Mood and Affect: Mood normal.        Behavior: Behavior normal.        Thought Content: Thought content normal.        Judgment: Judgment normal.       No results found for any visits on 11/04/20.  Assessment & Plan     1. Chronic bilateral low back pain with  bilateral sciatica Now with strength deficit on the left.  Refer for MRI and refer to neurosurgery.  Has had multiple surgeries by Dr. Verita Schneiders in the past who is now retired. - Ambulatory referral to Neurosurgery - MR Lumbar Spine Wo Contrast  2. Left leg pain  - Ambulatory referral to Neurosurgery - MR Lumbar Spine Wo Contrast  3. Diabetes mellitus type 2, uncontrolled, with complications (HCC) Farxiga 10 mg daily. - dapagliflozin propanediol (FARXIGA) 10 MG TABS tablet; Take 1 tablet (10 mg total) by mouth daily.  Dispense: 30 tablet; Refill: 2  4. S/P parathyroidectomy ([place]) well.  5. OSA on CPAP   6. Primary osteoarthritis of both knees   7. Lumbar spondylosis   8. Spinal stenosis of lumbar region without neurogenic claudication   9. Paroxysmal atrial fibrillation (HCC)   10. Class 2 severe obesity due to excess calories with serious comorbidity and body mass index (BMI) of 38.0 to 38.9 in adult (HCC) Weight loss stressed to help with all the above problems. Patient with diabetes hypertension hyperlipidemia and OSA   No follow-ups on file.         Margalit Leece Cranford Mon, MD  [facility] [phone] (phone) [phone] (fax)  [place]

## 2020-11-11 ENCOUNTER — Other Ambulatory Visit: Payer: Self-pay | Admitting: Family Medicine

## 2020-11-11 DIAGNOSIS — M48061 Spinal stenosis, lumbar region without neurogenic claudication: Secondary | ICD-10-CM

## 2020-11-11 NOTE — Telephone Encounter (Signed)
Medication Refill - Medication: oxyCODONE (OXYCONTIN) 12 hr tablet 40 mg (Patient was advised during most recent appt that medication was sent to pharmacy but patient did not receive medication)    Has the patient contacted their pharmacy?yes (Agent: If no, request that the patient contact the pharmacy for the refill.) (Agent: If yes, when and what did the pharmacy advise?)Contact PCP  Preferred Pharmacy (with phone number or street name):  [facility] [place] [facility] Phone:  [phone]  Fax:  [phone]       Agent: Please be advised that RX refills may take up to 3 business days. We ask that you follow-up with your pharmacy.

## 2020-11-11 NOTE — Telephone Encounter (Signed)
   Last refill: 08/06/2020  Future visit scheduled: yes  Notes to clinic: Patient states that medication was suppose to be sent but is not there    Requested Prescriptions  Pending Prescriptions Disp Refills   oxyCODONE (OXYCONTIN) 40 mg 12 hr tablet 270 tablet 0    Sig: Take 1 tablet (40 mg total) by mouth every 8 (eight) hours as needed.      Not Delegated - Analgesics:  Opioid Agonists Failed - 11/11/2020  1:49 PM      Failed - This refill cannot be delegated      Failed - Urine Drug Screen completed in last 360 days      Passed - Valid encounter within last 6 months    Recent Outpatient Visits           1 week ago Chronic bilateral low back pain with bilateral sciatica   [facility] Jerrol Banana., MD   1 month ago Pressure injury of skin of sacral region, unspecified injury stage   [facility] Jerrol Banana., MD   1 month ago Encounter for [employer] annual wellness exam   [facility] Jerrol Banana., MD   5 months ago Typical atrial flutter [facility])   [facility] Jerrol Banana., MD   5 months ago Hypercalcemia   [facility] Jerrol Banana., MD       Future Appointments             In 3 weeks Jerrol Banana., MD [facility], [place]   In 4 months Gollan, Kathlene November, MD [facility], [place]

## 2020-11-12 DIAGNOSIS — E291 Testicular hypofunction: Secondary | ICD-10-CM | POA: Diagnosis not present

## 2020-11-12 MED ORDER — OXYCODONE HCL ER 40 MG PO T12A: 40.0000 mg | EXTENDED_RELEASE_TABLET | Freq: Three times a day (TID) | ORAL | 0 refills | Status: DC | PRN

## 2020-11-17 MED ORDER — OXYCODONE HCL ER 40 MG PO T12A: 40.0000 mg | EXTENDED_RELEASE_TABLET | Freq: Three times a day (TID) | ORAL | 0 refills | Status: DC | PRN

## 2020-11-17 NOTE — Telephone Encounter (Signed)
Pts wife called and is requesting to have this medication sent to the [facility] instead. Please advise.

## 2020-11-17 NOTE — Addendum Note (Signed)
Addended by: Julieta Bellini on: 11/17/2020 10:59 AM   Modules accepted: Orders

## 2020-11-18 ENCOUNTER — Ambulatory Visit
Admission: RE | Admit: 2020-11-18 | Discharge: 2020-11-18 | Disposition: A | Discharge status: Home / Self Care | Payer: Medicare Other | Source: Ambulatory Visit | Attending: Family Medicine | Admitting: Family Medicine

## 2020-11-18 ENCOUNTER — Other Ambulatory Visit: Payer: Self-pay

## 2020-11-18 DIAGNOSIS — G8929 Other chronic pain: Secondary | ICD-10-CM | POA: Diagnosis not present

## 2020-11-18 DIAGNOSIS — M5441 Lumbago with sciatica, right side: Secondary | ICD-10-CM | POA: Insufficient documentation

## 2020-11-18 DIAGNOSIS — M79605 Pain in left leg: Secondary | ICD-10-CM | POA: Insufficient documentation

## 2020-11-18 DIAGNOSIS — M5442 Lumbago with sciatica, left side: Secondary | ICD-10-CM | POA: Diagnosis not present

## 2020-11-18 DIAGNOSIS — M545 Low back pain, unspecified: Secondary | ICD-10-CM | POA: Diagnosis not present

## 2020-11-18 NOTE — Telephone Encounter (Signed)
[facility] Pre [facility] from the Dr / pleas advise

## 2020-11-18 NOTE — Telephone Encounter (Signed)
Please review. Thanks!  

## 2020-11-25 ENCOUNTER — Telehealth: Payer: Self-pay

## 2020-11-25 NOTE — Telephone Encounter (Signed)
Pt's spouse called back in to follow up. She says that the pharmacy (mail order) is waiting for the provider to compete a prior authorization for pt's medication oxyCODONE (OXYCONTIN) 40 mg 12 hr tablet. Spouse says that pt is almost out of his medication.   Please assist pt further.

## 2020-11-25 NOTE — Telephone Encounter (Signed)
Copied from [place] [id number]. Topic: Quick Communication - Rx Refill/Question >> Nov 25, 2020  2:54 PM Hinda Lenis D wrote: please call PT wife back / Second request  [facility] on [facility] Pre authorization from the Dr / pleas advise oxyCODONE (OXYCONTIN) 40 mg 12 hr tablet [[id number]]

## 2020-11-26 ENCOUNTER — Other Ambulatory Visit: Payer: Self-pay | Admitting: Family Medicine

## 2020-11-26 NOTE — Telephone Encounter (Signed)
Called pharmacy concerning PA.

## 2020-11-26 NOTE — Telephone Encounter (Signed)
Received form for PA. Completed and faxed back.

## 2020-11-26 NOTE — Telephone Encounter (Signed)
PA started, waiting for reply.

## 2020-11-27 ENCOUNTER — Ambulatory Visit: Payer: Self-pay | Admitting: Family Medicine

## 2020-11-27 NOTE — Telephone Encounter (Signed)
PA was approved. 

## 2020-12-01 ENCOUNTER — Other Ambulatory Visit: Payer: Self-pay | Admitting: Family Medicine

## 2020-12-02 ENCOUNTER — Ambulatory Visit: Payer: Medicare Other | Admitting: Cardiovascular Disease

## 2020-12-04 ENCOUNTER — Other Ambulatory Visit: Payer: Self-pay | Admitting: Cardiovascular Disease

## 2020-12-04 ENCOUNTER — Other Ambulatory Visit: Payer: Self-pay | Admitting: Family Medicine

## 2020-12-04 ENCOUNTER — Ambulatory Visit: Payer: Self-pay | Admitting: Family Medicine

## 2020-12-04 NOTE — Progress Notes (Deleted)
Established patient visit   Patient: Jerry Parsons   DOB: [birth year]   69 y.o. Male  MRN: [id number] Visit Date: 12/04/2020  Today's healthcare provider: [name], MD   No chief complaint on file.  Subjective    HPI  Diabetes Mellitus Type II, follow-up  Lab Results  Component Value Date   HGBA1C 7.6 (H) 07/25/2020   HGBA1C 7.7 (H) 05/15/2020   HGBA1C 9.2 (A) 02/11/2020   Last seen for diabetes 5 months ago.  Management since then includes; Farxiga 10 mg daily. He reports {excellent/good/fair/poor:19665} compliance with treatment. He {is/is not:21021397} having side effects. {document side effects if present:1}  Home blood sugar records: {diabetes glucometry results:16657}  Episodes of hypoglycemia? {Yes/No:20286} {enter details if yes:1}   Current insulin regiment: {***Type 'None' if not taking insulin                                                otherwise enter complete                                                 details of insulin regiment:1} Most Recent Eye Exam: ***  --------------------------------------------------------------------------------------------------- Hypertension, follow-up  BP Readings from Last 3 Encounters:  11/04/20 126/70  10/06/20 (!) 141/65  09/22/20 132/68   Wt Readings from Last 3 Encounters:  11/04/20 270 lb (122.5 kg)  10/06/20 264 lb 3.2 oz (119.8 kg)  09/22/20 269 lb (122 kg)     He was last seen for hypertension 1 months ago.  BP at that visit was 126/70. Management since that visit includes; on metoprolol and HCTZ. He reports {excellent/good/fair/poor:19665} compliance with treatment. He {is/is not:9024} having side effects. {document side effects if present:1} He {is/is not:9024} exercising. He {is/is not:9024} adherent to low salt diet.   Outside blood pressures are {enter patient reported home BP, or 'not being checked':1}.  He {does/does not:200015} smoke.  Use of agents associated with  hypertension: {bp agents assoc with hypertension:511::"none"}.   ---------------------------------------------------------------------------------------------------  Chronic bilateral low back pain with bilateral sciatica From 11/04/2020-Now with strength deficit on the left.  Referred for MRI and refer to neurosurgery.  Has had multiple surgeries by Dr. Verita Schneiders in the past who is now retired.  Class 2 severe obesity due to excess calories with serious comorbidity and body mass index (BMI) of 38.0 to 38.9 in adult [facility]) From 11/04/2020-Weight loss stressed to help with all the above problems. Patient with diabetes hypertension hyperlipidemia and OSA.   {Show patient history (optional):23778::" "}   Medications: Outpatient Medications Prior to Visit  Medication Sig  . amiodarone (PACERONE) 200 MG tablet TAKE 1 TABLET BY MOUTH EVERY DAY (Patient taking differently: Take 200 mg by mouth daily. )  . apixaban (ELIQUIS) 5 MG TABS tablet Take 1 tablet (5 mg total) by mouth 2 (two) times daily.  . cyclobenzaprine (FLEXERIL) 10 MG tablet TAKE 1 TABLET BY MOUTH 3 TIMES A DAY (Patient taking differently: Take 10 mg by mouth 3 (three) times daily. )  . dapagliflozin propanediol (FARXIGA) 10 MG TABS tablet Take 1 tablet (10 mg total) by mouth daily.  [name] docusate sodium (COLACE) 100 MG capsule Take 300 mg by mouth daily.   [name]  dutasteride (AVODART) 0.5 MG capsule TAKE 1 CAPSULE DAILY (Patient taking differently: Take 0.5 mg by mouth daily. )  . esomeprazole (NEXIUM) 40 MG capsule Take 1 capsule (40 mg total) by mouth 2 (two) times daily.  . ferrous sulfate 325 (65 FE) MG tablet Take by mouth.  . fexofenadine (ALLEGRA) 180 MG tablet Take 180 mg by mouth daily.   . furosemide (LASIX) 40 MG tablet TAKE 1 TABLET (40 MG) BY MOUTH ONCE DAILY, YOU MAY TAKE 1 EXTRA TABLET (40 MG) AFTER LUNCH AS NEEDED FOR LEG SWELLING/ ABDOMINAL TIGHTNESS (Patient taking differently: Take 40 mg by mouth daily. )  . gabapentin  (NEURONTIN) 100 MG capsule TAKE 1 CAPSULE BY MOUTH TWICE A DAY (Patient taking differently: Take 100 mg by mouth 2 (two) times daily. )  . hydrochlorothiazide (HYDRODIURIL) 12.5 MG tablet TAKE 2 TABLETS (25 MG TOTAL) BY MOUTH AT BEDTIME.  . Insulin Pen Needle (PEN NEEDLES) 31G X 5 MM MISC 1 each by Does not apply route daily. PATIENT NEEDS NOVA TWIST NEEDLES 5 MM  DX E11.9  . JANUVIA 100 MG tablet TAKE 1 TABLET DAILY  . LANTUS SOLOSTAR 100 UNIT/ML Solostar Pen INJECT 35 UNITS INTO THE   SKIN TWO TIMES A DAY (Patient taking differently: Inject 38 Units into the skin 2 (two) times daily. )  . liraglutide (VICTOZA) 18 MG/3ML SOPN INJECT 0.3ML (=1.8MG )      SUBCUTANEOUSLY DAILY (Patient taking differently: Inject 1.8 mg into the skin daily. )  . magnesium oxide (MAG-OX) 400 MG tablet TAKE 1 TABLET (400 MG TOTAL) BY MOUTH 2 (TWO) TIMES DAILY. (Patient taking differently: Take 400 mg by mouth 2 (two) times daily. )  . MELATONIN PO Take 2 tablets by mouth at bedtime. Gummies  . metoprolol tartrate (LOPRESSOR) 100 MG tablet TAKE 1 TABLET (100 MG TOTAL) BY MOUTH 2 (TWO) TIMES DAILY.  . mupirocin cream (BACTROBAN) 2 % Apply 1 application topically 2 (two) times daily.  . Naloxone HCl 0.4 MG/0.4ML SOAJ Inject as needed for suspected overdose. (Patient taking differently: Take 0.4 mg by mouth 1 (one) time orthopaedic injection (Over dose). )  . NOVOLOG FLEXPEN 100 UNIT/ML FlexPen INJECT 12 UNITS            SUBCUTANEOUSLY 3 TIMES A   DAY WITH MEALS (Patient taking differently: Inject 14 Units into the skin in the morning and at bedtime. )  . NOVOTWIST 32G X 5 MM MISC USE 6 TIMES A DAY DX E11.9  . Omega-3 Fatty Acids (FISH OIL) 1200 MG CAPS Take 1,200 mg by mouth 2 (two) times daily.   Glory Rosebush ULTRA test strip USE WITH METER TWICE DAILY  . oxyCODONE (OXYCONTIN) 40 mg 12 hr tablet Take 1 tablet (40 mg total) by mouth every 8 (eight) hours as needed.  . pregabalin (LYRICA) 200 MG capsule TAKE 1 CAPSULE 3 TIMES A    DAY (Patient taking differently: Take 200 mg by mouth 3 (three) times daily. )  . rOPINIRole (REQUIP) 1 MG tablet TAKE 1 TO 2 TABLETS BY MOUTH AT BEDTIME AS NEEDED  . rOPINIRole (REQUIP) 2 MG tablet TAKE 1 TABLET BY MOUTH TWICE A DAY AS NEEDED (Patient taking differently: Take 2 mg by mouth at bedtime. )  . sucralfate (CARAFATE) 1 g tablet TAKE 1 TABLET BY MOUTH 4 (FOUR) TIMES DAILY - WITH MEALS AND AT BEDTIME.  . tamsulosin (FLOMAX) 0.4 MG CAPS capsule Take 0.4 mg by mouth daily.  [name] testosterone cypionate (DEPOTESTOSTERONE CYPIONATE) 200 MG/ML injection  Inject 200 mg into the muscle every 21 ( twenty-one) days.    No facility-administered medications prior to visit.    Review of Systems  Constitutional: Negative for appetite change, chills and fever.  Respiratory: Negative for chest tightness, shortness of breath and wheezing.   Cardiovascular: Negative for chest pain and palpitations.  Gastrointestinal: Negative for abdominal pain, nausea and vomiting.    {Heme  Chem  Endocrine  Serology  Results Review (optional):23779::" "}  Objective    There were no vitals taken for this visit. {Show previous vital signs (optional):23777::" "}  Physical Exam  ***  No results found for any visits on 12/04/20.  Assessment & Plan     ***  No follow-ups on file.      {provider attestation***:1}   [name], MD  [facility] [phone] (phone) [phone] (fax)  [place]

## 2020-12-04 NOTE — Telephone Encounter (Signed)
Rx request sent to pharmacy.  

## 2020-12-08 ENCOUNTER — Ambulatory Visit (INDEPENDENT_AMBULATORY_CARE_PROVIDER_SITE_OTHER): Payer: Medicare Other | Admitting: Family Medicine

## 2020-12-08 ENCOUNTER — Other Ambulatory Visit: Payer: Self-pay

## 2020-12-08 ENCOUNTER — Ambulatory Visit
Admission: RE | Admit: 2020-12-08 | Discharge: 2020-12-08 | Disposition: A | Discharge status: Home / Self Care | Payer: Medicare Other | Attending: Family Medicine | Admitting: Family Medicine

## 2020-12-08 ENCOUNTER — Encounter: Payer: Self-pay | Admitting: Family Medicine

## 2020-12-08 ENCOUNTER — Ambulatory Visit
Admission: RE | Admit: 2020-12-08 | Discharge: 2020-12-08 | Disposition: A | Discharge status: Home / Self Care | Payer: Medicare Other | Source: Ambulatory Visit | Attending: Family Medicine | Admitting: Family Medicine

## 2020-12-08 VITALS — BP 137/59 | HR 60 | Temp 99.1°F | Resp 16 | Wt 269.0 lb

## 2020-12-08 DIAGNOSIS — Z6838 Body mass index (BMI) 38.0-38.9, adult: Secondary | ICD-10-CM

## 2020-12-08 DIAGNOSIS — E118 Type 2 diabetes mellitus with unspecified complications: Secondary | ICD-10-CM

## 2020-12-08 DIAGNOSIS — E782 Mixed hyperlipidemia: Secondary | ICD-10-CM

## 2020-12-08 DIAGNOSIS — M79605 Pain in left leg: Secondary | ICD-10-CM | POA: Insufficient documentation

## 2020-12-08 DIAGNOSIS — M47816 Spondylosis without myelopathy or radiculopathy, lumbar region: Secondary | ICD-10-CM | POA: Diagnosis not present

## 2020-12-08 DIAGNOSIS — M170 Bilateral primary osteoarthritis of knee: Secondary | ICD-10-CM

## 2020-12-08 DIAGNOSIS — G4733 Obstructive sleep apnea (adult) (pediatric): Secondary | ICD-10-CM

## 2020-12-08 DIAGNOSIS — Z9989 Dependence on other enabling machines and devices: Secondary | ICD-10-CM | POA: Diagnosis not present

## 2020-12-08 DIAGNOSIS — I10 Essential (primary) hypertension: Secondary | ICD-10-CM

## 2020-12-08 DIAGNOSIS — I480 Paroxysmal atrial fibrillation: Secondary | ICD-10-CM

## 2020-12-08 DIAGNOSIS — L03119 Cellulitis of unspecified part of limb: Secondary | ICD-10-CM

## 2020-12-08 DIAGNOSIS — M1712 Unilateral primary osteoarthritis, left knee: Secondary | ICD-10-CM | POA: Diagnosis not present

## 2020-12-08 DIAGNOSIS — E1165 Type 2 diabetes mellitus with hyperglycemia: Secondary | ICD-10-CM

## 2020-12-08 DIAGNOSIS — IMO0002 Reserved for concepts with insufficient information to code with codable children: Secondary | ICD-10-CM

## 2020-12-08 LAB — POCT GLYCOSYLATED HEMOGLOBIN (HGB A1C)
Est. average glucose Bld gHb Est-mCnc: 203
Hemoglobin A1C: 8.7 % — AB (ref 4.0–5.6)

## 2020-12-08 MED ORDER — AMOXICILLIN-POT CLAVULANATE 875-125 MG PO TABS: 1.0000 | ORAL_TABLET | Freq: Two times a day (BID) | ORAL | 1 refills | Status: DC

## 2020-12-08 MED ORDER — DAPAGLIFLOZIN PROPANEDIOL 10 MG PO TABS: 10.0000 mg | ORAL_TABLET | Freq: Every day | ORAL | 3 refills | Status: DC

## 2020-12-08 NOTE — Progress Notes (Signed)
I,[name],acting as a scribe for [name], MD.,have documented all relevant documentation on the behalf of [name], MD,as directed by  [name], MD while in the presence of [name], MD.   Established patient visit   Patient: Jerry Parsons   DOB: Aug 07, 1951   69 y.o. Male  MRN: [id number] Visit Date: 12/08/2020  Today's healthcare provider: [name], MD   Chief Complaint  Patient presents with  . Follow-up  . Diabetes  . Hypertension   Subjective    HPI  Patient comes in today for follow-up of leg pain.  He is having worsening left leg pain below the knee.  He has some recent worsening erythema below the knee.  No recent trauma and no break in the skin.  Also has some knee pain in addition to the leg pain.  Wilder Glade has help in his sugar [place] A1c today is 8.7. Diabetes Mellitus Type II, follow-up  Lab Results  Component Value Date   HGBA1C 8.7 (A) 12/08/2020   HGBA1C 7.6 (H) 07/25/2020   HGBA1C 7.7 (H) 05/15/2020   Last seen for diabetes 5 months ago.  Management since then includes; Farxiga 10 mg daily. He reports good compliance with treatment. He is not having side effects. none  Home blood sugar records: fasting range: 125  Episodes of hypoglycemia? No none   Current insulin regiment: Novolog and Lantus  Most Recent Eye Exam: 02/15/2020  --------------------------------------------------------------------  Hypertension, follow-up  BP Readings from Last 3 Encounters:  12/08/20 (!) 137/59  11/04/20 126/70  10/06/20 (!) 141/65   Wt Readings from Last 3 Encounters:  12/08/20 269 lb (122 kg)  11/04/20 270 lb (122.5 kg)  10/06/20 264 lb 3.2 oz (119.8 kg)     He was last seen for hypertension 1 months ago.  BP at that visit was 126/70. Management since that visit includes; on metoprolol and HCTZ. He reports good compliance with treatment. He is not having side effects. none He is not  exercising. He is not adherent to low salt diet.   Outside blood pressures are not checking.  He does smoke.  Use of agents associated with hypertension: none.   --------------------------------------------------------------------  Chronic bilateral low back pain with bilateral sciatica From 11/04/2020-Now with strength deficit on the left.  Referred for MRI and referred to neurosurgery.  Has had multiple surgeries by Dr. Verita Schneiders in the past who is now retired.   Past Surgical History:  Procedure Laterality Date  . BACK SURGERY     x 8; upper x 3 & lower x 5  . CARDIOVERSION  03/14/13, 10/16   2014 - [place], 2016 - [place]  . CARDIOVERSION N/A 09/11/2020   Procedure: CARDIOVERSION;  Surgeon: Minna Merritts, MD;  Location: [facility] ORS;  Service: Cardiovascular;  Laterality: N/A;  . CATARACT EXTRACTION W/PHACO Left 10/29/2015   Procedure: CATARACT EXTRACTION PHACO AND INTRAOCULAR LENS PLACEMENT ([place]);  Surgeon: Leandrew Koyanagi, MD;  Location: [place];  Service: Ophthalmology;  Laterality: Left;  DIABETIC - insulin and oral medsSleep apnea - no machine  . CHOLECYSTECTOMY    . COLONOSCOPY WITH PROPOFOL N/A 01/16/2018   Procedure: COLONOSCOPY WITH PROPOFOL;  Surgeon: Manya Silvas, MD;  Location: [facility] ENDOSCOPY;  Service: Endoscopy;  Laterality: N/A;  . ESOPHAGOGASTRODUODENOSCOPY (EGD) WITH PROPOFOL N/A 01/16/2018   Procedure: ESOPHAGOGASTRODUODENOSCOPY (EGD) WITH PROPOFOL;  Surgeon: Manya Silvas, MD;  [facility];  Service: Endoscopy;  Laterality: N/A;  . EYE  SURGERY    . GALLBLADDER SURGERY  2002  . JOINT REPLACEMENT Right 2018  . KNEE ARTHROSCOPY     left   . PARATHYROIDECTOMY N/A 07/25/2020   Procedure: PARATHYROIDECTOMY;  Surgeon: Fredirick Maudlin, MD;  Location: [facility] ORS;  Service: General;  Laterality: N/A;  . ROTATOR CUFF REPAIR  2001   left   . TEE WITHOUT CARDIOVERSION N/A 04/26/2018   Procedure: TRANSESOPHAGEAL ECHOCARDIOGRAM (TEE);  Surgeon: Nelva Bush, MD;  Location: [facility] ORS;  Service: Cardiovascular;  Laterality: N/A;  . TONSILLECTOMY    . TOTAL KNEE ARTHROPLASTY Right 06/20/2017   Procedure: RIGHT TOTAL KNEE ARTHROPLASTY;  Surgeon: Gaynelle Arabian, MD;  Location: [facility];  Service: Orthopedics;  Laterality: Right;       Medications: Outpatient Medications Prior to Visit  Medication Sig  . amiodarone (PACERONE) 200 MG tablet Take 1 tablet (200 mg total) by mouth daily.  [name] apixaban (ELIQUIS) 5 MG TABS tablet Take 1 tablet (5 mg total) by mouth 2 (two) times daily.  . cyclobenzaprine (FLEXERIL) 10 MG tablet TAKE 1 TABLET BY MOUTH 3 TIMES A DAY (Patient taking differently: Take 10 mg by mouth 3 (three) times daily.)  . dapagliflozin propanediol (FARXIGA) 10 MG TABS tablet Take 1 tablet (10 mg total) by mouth daily.  [name] docusate sodium (COLACE) 100 MG capsule Take 300 mg by mouth daily.  [name] dutasteride (AVODART) 0.5 MG capsule TAKE 1 CAPSULE DAILY (Patient taking differently: Take 0.5 mg by mouth daily.)  . esomeprazole (NEXIUM) 40 MG capsule Take 1 capsule (40 mg total) by mouth 2 (two) times daily.  . ferrous sulfate 325 (65 FE) MG tablet Take by mouth.  . fexofenadine (ALLEGRA) 180 MG tablet Take 180 mg by mouth daily.  . furosemide (LASIX) 40 MG tablet TAKE 1 TABLET (40 MG) BY MOUTH ONCE DAILY, YOU MAY TAKE 1 EXTRA TABLET (40 MG) AFTER LUNCH AS NEEDED FOR LEG SWELLING/ ABDOMINAL TIGHTNESS (Patient taking differently: Take 40 mg by mouth daily.)  . gabapentin (NEURONTIN) 100 MG capsule TAKE 1 CAPSULE BY MOUTH TWICE A DAY (Patient taking differently: Take 100 mg by mouth 2 (two) times daily.)  . hydrochlorothiazide (HYDRODIURIL) 12.5 MG tablet TAKE 2 TABLETS (25 MG TOTAL) BY MOUTH AT BEDTIME.  . Insulin Pen Needle (PEN NEEDLES) 31G X 5 MM MISC 1 each by Does not apply route daily. PATIENT NEEDS NOVA TWIST NEEDLES 5 MM  DX E11.9  . JANUVIA 100 MG tablet TAKE 1 TABLET DAILY  . LANTUS SOLOSTAR 100 UNIT/ML Solostar Pen INJECT 35 UNITS  INTO THE   SKIN TWO TIMES A DAY (Patient taking differently: Inject 38 Units into the skin 2 (two) times daily.)  . liraglutide (VICTOZA) 18 MG/3ML SOPN INJECT 0.3ML (=1.8MG )      SUBCUTANEOUSLY DAILY (Patient taking differently: Inject 1.8 mg into the skin daily.)  . magnesium oxide (MAG-OX) 400 MG tablet TAKE 1 TABLET (400 MG TOTAL) BY MOUTH 2 (TWO) TIMES DAILY. (Patient taking differently: Take 400 mg by mouth 2 (two) times daily.)  . MELATONIN PO Take 2 tablets by mouth at bedtime. Gummies  . metoprolol tartrate (LOPRESSOR) 100 MG tablet TAKE 1 TABLET (100 MG TOTAL) BY MOUTH 2 (TWO) TIMES DAILY.  . mupirocin cream (BACTROBAN) 2 % Apply 1 application topically 2 (two) times daily.  . Naloxone HCl 0.4 MG/0.4ML SOAJ Inject as needed for suspected overdose. (Patient taking differently: Take 0.4 mg by mouth 1 (one) time orthopaedic injection (Over dose).)  . NOVOLOG FLEXPEN 100 UNIT/ML FlexPen INJECT  12 UNITS            SUBCUTANEOUSLY 3 TIMES A   DAY WITH MEALS (Patient taking differently: Inject 14 Units into the skin in the morning and at bedtime.)  . NOVOTWIST 32G X 5 MM MISC USE 6 TIMES A DAY DX E11.9  . Omega-3 Fatty Acids (FISH OIL) 1200 MG CAPS Take 1,200 mg by mouth 2 (two) times daily.   Glory Rosebush ULTRA test strip USE WITH METER TWICE DAILY  . oxyCODONE (OXYCONTIN) 40 mg 12 hr tablet Take 1 tablet (40 mg total) by mouth every 8 (eight) hours as needed.  . pregabalin (LYRICA) 200 MG capsule TAKE 1 CAPSULE 3 TIMES A   DAY (Patient taking differently: Take 200 mg by mouth 3 (three) times daily.)  . rOPINIRole (REQUIP) 1 MG tablet TAKE 1 TO 2 TABLETS BY MOUTH AT BEDTIME AS NEEDED  . rOPINIRole (REQUIP) 2 MG tablet TAKE 1 TABLET BY MOUTH TWICE A DAY AS NEEDED (Patient taking differently: Take 2 mg by mouth at bedtime.)  . sucralfate (CARAFATE) 1 g tablet TAKE 1 TABLET BY MOUTH 4 (FOUR) TIMES DAILY - WITH MEALS AND AT BEDTIME.  . tamsulosin (FLOMAX) 0.4 MG CAPS capsule Take 0.4 mg by mouth daily.   [name] testosterone cypionate (DEPOTESTOSTERONE CYPIONATE) 200 MG/ML injection Inject 200 mg into the muscle every 21 ( twenty-one) days.    No facility-administered medications prior to visit.    Review of Systems  Constitutional: Negative for appetite change, chills and fever.  Respiratory: Negative for chest tightness, shortness of breath and wheezing.   Cardiovascular: Negative for chest pain and palpitations.  Gastrointestinal: Negative for abdominal pain, nausea and vomiting.    Last hemoglobin A1c Lab Results  Component Value Date   HGBA1C 8.7 (A) 12/08/2020      Objective    BP (!) 137/59 (BP Location: Right Arm, Patient Position: Sitting, Cuff Size: Large)   Pulse 60   Temp 99.1 F (37.3 C) (Oral)   Resp 16   Wt 269 lb (122 kg)   SpO2 96%   BMI 37.52 kg/m  BP Readings from Last 3 Encounters:  12/08/20 (!) 137/59  11/04/20 126/70  10/06/20 (!) 141/65   Wt Readings from Last 3 Encounters:  12/08/20 269 lb (122 kg)  11/04/20 270 lb (122.5 kg)  10/06/20 264 lb 3.2 oz (119.8 kg)      Physical Exam Vitals reviewed.  Constitutional:      Appearance: He is well-developed. He is obese.  HENT:     Head: Normocephalic and atraumatic.     Right Ear: External ear normal.     Left Ear: External ear normal.     Nose: Nose normal.  Eyes:     General: No scleral icterus.    Conjunctiva/sclera: Conjunctivae normal.  Neck:     Thyroid: No thyromegaly.  Cardiovascular:     Rate and Rhythm: Normal rate and regular rhythm.     Heart sounds: Normal heart sounds.  Pulmonary:     Effort: Pulmonary effort is normal.     Breath sounds: Normal breath sounds.  Abdominal:     Palpations: Abdomen is soft.  Musculoskeletal:     Right lower leg: Edema present.     Left lower leg: Edema present.     Comments: 1+ LE edema.  Both calves are tight.  He has tenderness and erythema of the lower two thirds of the left anterior leg.  This is the leg below the knee.  No cords negative  Homans noted.  Skin:    General: Skin is warm and dry.  Neurological:     Mental Status: He is alert and oriented to person, place, and time. Mental status is at baseline.     Comments: Decreased sensation to monofilament in bilateral anterior feet.   Psychiatric:        Mood and Affect: Mood normal.        Behavior: Behavior normal.        Thought Content: Thought content normal.        Judgment: Judgment normal.       Results for orders placed or performed in visit on 12/08/20  POCT glycosylated hemoglobin (Hb A1C)  Result Value Ref Range   Hemoglobin A1C 8.7 (A) 4.0 - 5.6 %   Est. average glucose Bld gHb Est-mCnc 203     Assessment & Plan     1. Diabetes mellitus type 2, uncontrolled, with complications (HCC) H2C is 8.7 today.  Goal is less than 8.  Continue Farxiga 10 mg daily - POCT glycosylated hemoglobin (Hb A1C) - dapagliflozin propanediol (FARXIGA) 10 MG TABS tablet; Take 1 tablet (10 mg total) by mouth daily.  Dispense: 90 tablet; Refill: 3  2. Cellulitis of lower extremity, unspecified laterality Treat with Augmentin.  Patient is anticoagulated but will obtain Dopplers just to make sure this not DVT. - VAS [place] LOWER EXTREMITY VENOUS (DVT)  3. Left leg pain Also could be osteoarthritic pain he does describe some neurogenic claudication. He is getting some relief of gabapentin 100 mg twice a day and then 300 mg at bedtime - DG Knee Complete 4 Views Left  4. Paroxysmal atrial fibrillation (HCC) On Eliquis  5. HYPERTENSION, BENIGN   6. OSA on CPAP On CPAP nightly with relief and improvement  7. Class 2 severe obesity due to excess calories with serious comorbidity and body mass index (BMI) of 38.0 to 38.9 in adult [facility]) With CPAP and diabetes and hypertension and arthritis  8. Mixed hyperlipidemia   9. Primary osteoarthritis of both knees   10. Lumbar spondylosis Patient describing some neurogenic claudication.  Has appointment with neurosurgery next  month I believe   No follow-ups on file.         Richard Cranford Mon, MD  [facility] [phone] (phone) [phone] (fax)  [place]

## 2020-12-11 ENCOUNTER — Ambulatory Visit (INDEPENDENT_AMBULATORY_CARE_PROVIDER_SITE_OTHER): Payer: Medicare Other

## 2020-12-11 DIAGNOSIS — L03119 Cellulitis of unspecified part of limb: Secondary | ICD-10-CM

## 2020-12-12 ENCOUNTER — Other Ambulatory Visit: Payer: Self-pay | Admitting: Family Medicine

## 2020-12-12 DIAGNOSIS — L89159 Pressure ulcer of sacral region, unspecified stage: Secondary | ICD-10-CM

## 2020-12-12 DIAGNOSIS — G2581 Restless legs syndrome: Secondary | ICD-10-CM

## 2020-12-23 ENCOUNTER — Other Ambulatory Visit: Payer: Self-pay | Admitting: Family Medicine

## 2020-12-23 DIAGNOSIS — G2581 Restless legs syndrome: Secondary | ICD-10-CM

## 2020-12-23 DIAGNOSIS — IMO0002 Reserved for concepts with insufficient information to code with codable children: Secondary | ICD-10-CM

## 2020-12-23 DIAGNOSIS — E1165 Type 2 diabetes mellitus with hyperglycemia: Secondary | ICD-10-CM

## 2020-12-24 ENCOUNTER — Other Ambulatory Visit: Payer: Self-pay | Admitting: Family Medicine

## 2020-12-24 DIAGNOSIS — E291 Testicular hypofunction: Secondary | ICD-10-CM | POA: Diagnosis not present

## 2020-12-24 NOTE — Telephone Encounter (Signed)
Requested medication (s) are due for refill today:  Yes  Requested medication (s) are on the active medication list:  Yes  Future visit scheduled:  Yes  Last Refill: 11/07/19; #270; RF x 1  Notes to clinic: not delegated  Requested Prescriptions  Pending Prescriptions Disp Refills   pregabalin (LYRICA) 200 MG capsule 270 capsule 1      Not Delegated - Neurology:  Anticonvulsants - Controlled Failed - 12/24/2020 11:16 AM      Failed - This refill cannot be delegated      Passed - Valid encounter within last 12 months    Recent Outpatient Visits           2 weeks ago Diabetes mellitus type 2, uncontrolled, with complications [facility])   [facility] Maple Hudson., MD   1 month ago Chronic bilateral low back pain with bilateral sciatica   [facility] Maple Hudson., MD   2 months ago Pressure injury of skin of sacral region, unspecified injury stage   [facility] Maple Hudson., MD   3 months ago Encounter for [employer] annual wellness exam   [facility] Maple [facility]., MD   6 months ago Typical atrial flutter [facility])   [facility] Maple Hudson., MD       Future Appointments             In 6 days Maple Hudson., MD [facility], [facility]   In 2 months Gollan, Tollie Pizza, MD [facility], [facility]

## 2020-12-24 NOTE — Telephone Encounter (Signed)
Requested medication (s) are due for refill today: expired medication  Requested medication (s) are on the active medication list: yes   Last refill:  11/07/2019 #270 1 refills   Future visit scheduled: yes in 6 days   Notes to clinic:  expired medication, not delegated per protocol, Do you want to renew Rx?     Requested Prescriptions  Pending Prescriptions Disp Refills   pregabalin (LYRICA) 200 MG capsule [Pharmacy Med Name: PREGABALIN CAP 200MG ] 270 capsule 1    Sig: TAKE 1 CAPSULE 3 TIMES A   DAY      Not Delegated - Neurology:  Anticonvulsants - Controlled Failed - 12/24/2020  8:58 AM      Failed - This refill cannot be delegated      Passed - Valid encounter within last 12 months    Recent Outpatient Visits           2 weeks ago Diabetes mellitus type 2, uncontrolled, with complications [facility])   [facility]., MD   1 month ago Chronic bilateral low back pain with bilateral sciatica   [facility]., MD   2 months ago Pressure injury of skin of sacral region, unspecified injury stage   [facility]., MD   3 months ago Encounter for Maple Hudson annual wellness exam   [facility]., MD   6 months ago Typical atrial flutter [facility])   [facility]., MD       Future Appointments             In 6 days Maple Hudson., MD [facility], [facility]   In 2 months Gollan, [facility], MD [facility], [facility]

## 2020-12-24 NOTE — Telephone Encounter (Signed)
Pt request refill  pregabalin (LYRICA) 200 MG capsule  90 day  270 caps  [facility] [place], [place] - [address] AT Portal to Registered [employer]

## 2020-12-30 ENCOUNTER — Ambulatory Visit: Payer: Self-pay | Admitting: Family Medicine

## 2020-12-31 NOTE — Telephone Encounter (Signed)
Pts wife called about the status of the PA needed for pts Pregabalin and  dapagliflozin propanediol (FARXIGA) 10 MG TABS tablet    / she stated she was told it needs a PA / Pt is completely of both medications and wanted to know the status / please advise asap

## 2021-01-01 MED ORDER — PREGABALIN 200 MG PO CAPS: ORAL_CAPSULE | ORAL | 1 refills | Status: DC

## 2021-01-01 NOTE — Telephone Encounter (Signed)
We can try for a PA for the [place] but it might be is just a change in his insurance and instead of [place] they will cover either [employer] or [employer]

## 2021-01-06 ENCOUNTER — Other Ambulatory Visit: Payer: Self-pay | Admitting: Family Medicine

## 2021-01-06 ENCOUNTER — Other Ambulatory Visit: Payer: Medicare Other

## 2021-01-06 ENCOUNTER — Other Ambulatory Visit: Payer: Self-pay

## 2021-01-06 DIAGNOSIS — R059 Cough, unspecified: Secondary | ICD-10-CM | POA: Diagnosis not present

## 2021-01-06 DIAGNOSIS — J069 Acute upper respiratory infection, unspecified: Secondary | ICD-10-CM | POA: Diagnosis not present

## 2021-01-07 NOTE — Telephone Encounter (Signed)
PA done

## 2021-01-08 LAB — COVID-19, FLU A+B AND RSV
Influenza A, NAA: NOT DETECTED
Influenza B, NAA: NOT DETECTED
RSV, NAA: NOT DETECTED
SARS-CoV-2, NAA: NOT DETECTED

## 2021-01-15 ENCOUNTER — Ambulatory Visit (INDEPENDENT_AMBULATORY_CARE_PROVIDER_SITE_OTHER): Payer: Medicare Other | Admitting: Sports Medicine

## 2021-01-15 ENCOUNTER — Other Ambulatory Visit: Payer: Self-pay

## 2021-01-15 ENCOUNTER — Encounter: Payer: Self-pay | Admitting: Sports Medicine

## 2021-01-15 DIAGNOSIS — I739 Peripheral vascular disease, unspecified: Secondary | ICD-10-CM | POA: Diagnosis not present

## 2021-01-15 DIAGNOSIS — E1142 Type 2 diabetes mellitus with diabetic polyneuropathy: Secondary | ICD-10-CM | POA: Diagnosis not present

## 2021-01-15 DIAGNOSIS — B351 Tinea unguium: Secondary | ICD-10-CM | POA: Diagnosis not present

## 2021-01-15 DIAGNOSIS — M79675 Pain in left toe(s): Secondary | ICD-10-CM

## 2021-01-15 DIAGNOSIS — M79674 Pain in right toe(s): Secondary | ICD-10-CM

## 2021-01-15 NOTE — Progress Notes (Signed)
Subjective: Jerry Parsons is a 70 y.o. male patient with history of diabetes who presents to office today complaining of long,mildly painful nails while ambulating in shoes; unable to trim.  Patient states that the glucose has been doing good around 120s since being on fluid pills.  Last PCP visit 1 month ago  Patient Active Problem List   Diagnosis Date Noted  . S/P parathyroidectomy ([place]) 07/25/2020  . Primary hyperparathyroidism ([place])   . Loss of consciousness ([place]) 06/05/2020  . Osteoarthritis of both knees 11/11/2018  . OSA on CPAP 10/14/2018  . MRSA bacteremia 05/19/2018  . Lumbar spondylosis 05/05/2018  . Spinal stenosis of lumbar region 05/05/2018  . Constipation, chronic 03/08/2018  . Pain in limb 03/03/2018  . Gastroesophageal reflux disease without esophagitis 01/04/2018  . OA (osteoarthritis) of knee 06/20/2017  . Sepsis due to pneumonia ([place]) 05/07/2017  . Neuropathy 10/13/2016  . Amblyopia 12/30/2015  . 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 NS (nuclear sclerosis) 12/30/2015  . Pseudoaphakia 12/30/2015  . Dehydration 09/08/2015  . Aspiration pneumonia ([place]) 07/04/2015  . Paroxysmal atrial fibrillation ([place]) 07/04/2015  . Arthritis of knee, degenerative 05/28/2015  . Chronic back pain 09/17/2014  . Leg edema 05/07/2014  . Chronic diastolic CHF (congestive heart failure) ([place]) 05/07/2014  . Campylobacter diarrhea 04/25/2013  . Atrial flutter ([place]) 01/23/2013  . Obesity 05/19/2012  . Hyperlipidemia 11/11/2011  . Diastolic dysfunction 95/62/1308  . SOB (shortness of breath) 04/12/2011  . Diabetes mellitus type 2, uncontrolled, with complications ([place]) 65/78/4696  . HYPERTENSION, BENIGN 03/15/2011  . DVT 03/15/2011  . TACHYCARDIA 03/15/2011   Current Outpatient Medications on File Prior to Visit  Medication Sig Dispense Refill  . amiodarone (PACERONE) 200 MG tablet Take 1 tablet (200 mg total) by mouth daily. 90 tablet 1  . amoxicillin-clavulanate (AUGMENTIN) 875-125 MG  tablet Take 1 tablet by mouth 2 (two) times daily. 20 tablet 1  . apixaban (ELIQUIS) 5 MG TABS tablet Take 1 tablet (5 mg total) by mouth 2 (two) times daily. 180 tablet 3  . cyclobenzaprine (FLEXERIL) 10 MG tablet TAKE 1 TABLET BY MOUTH 3 TIMES A DAY (Patient taking differently: Take 10 mg by mouth 3 (three) times daily.) 90 tablet 12  . dapagliflozin propanediol (FARXIGA) 10 MG TABS tablet Take 1 tablet (10 mg total) by mouth daily. 90 tablet 3  . docusate sodium (COLACE) 100 MG capsule Take 300 mg by mouth daily.    [name] dutasteride (AVODART) 0.5 MG capsule TAKE 1 CAPSULE DAILY (Patient taking differently: Take 0.5 mg by mouth daily.) 90 capsule 3  . esomeprazole (NEXIUM) 40 MG capsule Take 1 capsule (40 mg total) by mouth 2 (two) times daily. 180 capsule 3  . ferrous sulfate 325 (65 FE) MG tablet Take by mouth.    . fexofenadine (ALLEGRA) 180 MG tablet Take 180 mg by mouth daily.    . furosemide (LASIX) 40 MG tablet TAKE 1 TABLET (40 MG) BY MOUTH ONCE DAILY, YOU MAY TAKE 1 EXTRA TABLET (40 MG) AFTER LUNCH AS NEEDED FOR LEG SWELLING/ ABDOMINAL TIGHTNESS (Patient taking differently: Take 40 mg by mouth daily.) 180 tablet 2  . gabapentin (NEURONTIN) 100 MG capsule TAKE 1 CAPSULE BY MOUTH TWICE A DAY (Patient taking differently: Take 100 mg by mouth 2 (two) times daily.) 180 capsule 2  . hydrochlorothiazide (HYDRODIURIL) 12.5 MG tablet TAKE 2 TABLETS (25 MG TOTAL) BY MOUTH AT BEDTIME. 60 tablet 0  . Insulin Pen Needle (PEN NEEDLES) 31G X 5 MM MISC 1 each  by Does not apply route daily. PATIENT NEEDS NOVA TWIST NEEDLES 5 MM  DX E11.9 200 each 3  . JANUVIA 100 MG tablet TAKE 1 TABLET DAILY 90 tablet 3  . LANTUS SOLOSTAR 100 UNIT/ML Solostar Pen INJECT 35 UNITS INTO THE   SKIN TWO TIMES A DAY (Patient taking differently: Inject 38 Units into the skin 2 (two) times daily.) 45 mL 3  . liraglutide (VICTOZA) 18 MG/3ML SOPN Inject 1.8 mg into the skin daily. 27 mL 3  . magnesium oxide (MAG-OX) 400 MG tablet TAKE  1 TABLET (400 MG TOTAL) BY MOUTH 2 (TWO) TIMES DAILY. (Patient taking differently: Take 400 mg by mouth 2 (two) times daily.) 180 tablet 3  . MELATONIN PO Take 2 tablets by mouth at bedtime. Gummies    . metoprolol tartrate (LOPRESSOR) 100 MG tablet TAKE 1 TABLET (100 MG TOTAL) BY MOUTH 2 (TWO) TIMES DAILY. 60 tablet 11  . mupirocin cream (BACTROBAN) 2 % APPLY TO AFFECTED AREA TWICE A DAY 30 g 0  . Naloxone HCl 0.4 MG/0.4ML SOAJ Inject as needed for suspected overdose. (Patient taking differently: Take 0.4 mg by mouth 1 (one) time orthopaedic injection (Over dose).) 0.4 mL 2  . NOVOLOG FLEXPEN 100 UNIT/ML FlexPen INJECT 12 UNITS            SUBCUTANEOUSLY 3 TIMES A   DAY WITH MEALS (Patient taking differently: Inject 14 Units into the skin in the morning and at bedtime.) 15 mL 5  . NOVOTWIST 32G X 5 MM MISC USE 6 TIMES A DAY DX E11.9 100 each 9  . Omega-3 Fatty Acids (FISH OIL) 1200 MG CAPS Take 1,200 mg by mouth 2 (two) times daily.     Glory Rosebush ULTRA test strip USE WITH METER TWICE DAILY 100 strip 10  . oxyCODONE (OXYCONTIN) 40 mg 12 hr tablet Take 1 tablet (40 mg total) by mouth every 8 (eight) hours as needed. 270 tablet 0  . pregabalin (LYRICA) 200 MG capsule TAKE 1 CAPSULE 3 TIMES A   DAY 270 capsule 1  . rOPINIRole (REQUIP) 1 MG tablet TAKE 1 TO 2 TABLETS BY MOUTH AT BEDTIME AS NEEDED 180 tablet 0  . rOPINIRole (REQUIP) 2 MG tablet TAKE 1 TABLET BY MOUTH TWICE A DAY AS NEEDED 60 tablet 2  . sucralfate (CARAFATE) 1 g tablet TAKE 1 TABLET BY MOUTH 4 (FOUR) TIMES DAILY - WITH MEALS AND AT BEDTIME. 360 tablet 1  . tamsulosin (FLOMAX) 0.4 MG CAPS capsule Take 0.4 mg by mouth daily.    [name] testosterone cypionate (DEPOTESTOSTERONE CYPIONATE) 200 MG/ML injection Inject 200 mg into the muscle every 21 ( twenty-one) days.      No current facility-administered medications on file prior to visit.   Allergies  Allergen Reactions  . Carisoprodol Itching  . Tamsulosin     Pt stated, "upsets my stomach"     Objective: General: Patient is awake, alert, and oriented x 3 and in no acute distress.  Integument: Skin is warm, dry and supple bilateral. Nails are tender, long, thickened and dystrophic with subungual debris, consistent with onychomycosis, 1-5 bilateral. No open lesions or preulcerative lesions present bilateral. Remaining integument unremarkable. .  Vasculature:  Dorsalis Pedis pulse 0/4 bilateral. Posterior Tibial pulse  0/4 bilateral. Capillary fill time <3 sec 1-5 bilateral. No hair growth to the level of the digits.Temperature gradient within normal limits. No varicosities present bilateral. 1+ pitting edema present bilateral. +Stucco dermatitis due to PVD without infection.   Neurology: The  patient has absent sensation measured with a 5.07/10g Semmes Weinstein Monofilament at all pedal sites bilateral, unchanged from prior  Musculoskeletal: Asymptomatic hammertoes pedal deformities noted bilateral R>L.  Muscular strength 5/5 in all lower extremity muscular groups bilateral without pain on range of motion. No tenderness with calf compression bilateral.  Assessment and Plan: Problem List Items Addressed This Visit   None   Visit Diagnoses    Pain due to onychomycosis of toenails of both feet    -  Primary   Diabetic polyneuropathy associated with type 2 diabetes mellitus (HCC)       PAD (peripheral artery disease) ([place])         -Examined patient. -Re-Discussed and educated patient on diabetic foot care, especially with  regards to the vascular, neurological and musculoskeletal systems.  -Mechanically debrided all nails 1-5 bilateral using sterile nail nipper and filed with dremel without incident  -Answered all patient questions -Patient to return  in 2.5 to 3 months for at risk foot care or sooner if problems arise.  Landis Martins, DPM

## 2021-01-19 DIAGNOSIS — G8929 Other chronic pain: Secondary | ICD-10-CM | POA: Diagnosis not present

## 2021-01-19 DIAGNOSIS — M545 Low back pain, unspecified: Secondary | ICD-10-CM | POA: Diagnosis not present

## 2021-01-21 DIAGNOSIS — E291 Testicular hypofunction: Secondary | ICD-10-CM | POA: Diagnosis not present

## 2021-01-21 DIAGNOSIS — Z79899 Other long term (current) drug therapy: Secondary | ICD-10-CM | POA: Diagnosis not present

## 2021-02-04 ENCOUNTER — Other Ambulatory Visit: Payer: Self-pay | Admitting: Family Medicine

## 2021-02-04 DIAGNOSIS — M48061 Spinal stenosis, lumbar region without neurogenic claudication: Secondary | ICD-10-CM

## 2021-02-04 NOTE — Telephone Encounter (Signed)
Medication Refill - Medication: oxyCODONE (OXYCONTIN) 40 mg 12 hr tablet  Pt has about a week left according to his wife. Please advise   Has the patient contacted their pharmacy? No. (Agent: If no, request that the patient contact the pharmacy for the refill.) (Agent: If yes, when and what did the pharmacy advise?)  Preferred Pharmacy (with phone number or street name):  [facility] [place] [facility] [address] [place] [address]  Phone: [phone] Fax: [phone]     Agent: Please be advised that RX refills may take up to 3 business days. We ask that you follow-up with your pharmacy.

## 2021-02-04 NOTE — Telephone Encounter (Signed)
Requested medication (s) are due for refill today: yes  Requested medication (s) are on the active medication list: yes  Last refill:  11/17/2020  Future visit scheduled: no  Notes to clinic:  this refill cannot be delegated    Requested Prescriptions  Pending Prescriptions Disp Refills   oxyCODONE (OXYCONTIN) 40 mg 12 hr tablet 270 tablet 0    Sig: Take 1 tablet (40 mg total) by mouth every 8 (eight) hours as needed.      There is no refill protocol information for this order

## 2021-02-05 MED ORDER — OXYCODONE HCL ER 40 MG PO T12A: 40.0000 mg | EXTENDED_RELEASE_TABLET | Freq: Three times a day (TID) | ORAL | 0 refills | Status: DC | PRN

## 2021-02-17 DIAGNOSIS — Z20822 Contact with and (suspected) exposure to covid-19: Secondary | ICD-10-CM | POA: Diagnosis not present

## 2021-02-17 DIAGNOSIS — R059 Cough, unspecified: Secondary | ICD-10-CM | POA: Diagnosis not present

## 2021-02-17 DIAGNOSIS — J22 Unspecified acute lower respiratory infection: Secondary | ICD-10-CM | POA: Diagnosis not present

## 2021-02-21 DIAGNOSIS — R051 Acute cough: Secondary | ICD-10-CM | POA: Diagnosis not present

## 2021-02-21 DIAGNOSIS — Z20822 Contact with and (suspected) exposure to covid-19: Secondary | ICD-10-CM | POA: Diagnosis not present

## 2021-02-21 DIAGNOSIS — J09X2 Influenza due to identified novel influenza A virus with other respiratory manifestations: Secondary | ICD-10-CM | POA: Diagnosis not present

## 2021-02-22 ENCOUNTER — Other Ambulatory Visit: Payer: Self-pay | Admitting: Family Medicine

## 2021-02-22 NOTE — Telephone Encounter (Signed)
Requested Prescriptions  Pending Prescriptions Disp Refills  . hydrochlorothiazide (HYDRODIURIL) 12.5 MG tablet [Pharmacy Med Name: HYDROCHLOROTHIAZIDE 12.5 MG TB] 180 tablet 0    Sig: TAKE 2 TABLETS (25 MG TOTAL) BY MOUTH AT BEDTIME.     Cardiovascular: Diuretics - Thiazide Failed - 02/22/2021 11:32 AM      Failed - Ca in normal range and within 360 days    Calcium  Date Value Ref Range Status  09/09/2020 8.3 (L) 8.9 - 10.3 mg/dL Final   Calcium, Total  Date Value Ref Range Status  04/12/2013 8.8 8.5 - 10.1 mg/dL Final   Calcium, Ion  Date Value Ref Range Status  05/29/2020 6.0 (H) 4.5 - 5.6 mg/dL Final         Passed - Cr in normal range and within 360 days    Creat  Date Value Ref Range Status  12/13/2017 1.39 (H) 0.70 - 1.25 mg/dL Final    Comment:    For patients >14 years of age, the reference limit for Creatinine is approximately 13% higher for people identified as African-American. .    Creatinine, Ser  Date Value Ref Range Status  09/09/2020 1.18 0.61 - 1.24 mg/dL Final   Creatinine, Urine  Date Value Ref Range Status  06/24/2020 73 mg/dL Final         Passed - K in normal range and within 360 days    Potassium  Date Value Ref Range Status  09/09/2020 3.8 3.5 - 5.1 mmol/L Final  04/12/2013 4.0 3.5 - 5.1 mmol/L Final         Passed - Na in normal range and within 360 days    Sodium  Date Value Ref Range Status  09/09/2020 139 135 - 145 mmol/L Final  07/02/2020 143 134 - 144 mmol/L Final  04/12/2013 137 136 - 145 mmol/L Final         Passed - Last BP in normal range    BP Readings from Last 1 Encounters:  12/08/20 (!) 137/59         Passed - Valid encounter within last 6 months    Recent Outpatient Visits          2 months ago Diabetes mellitus type 2, uncontrolled, with complications [facility])   [facility] Jerrol Banana., MD   3 months ago Chronic bilateral low back pain with bilateral sciatica   [facility]  Jerrol Banana., MD   4 months ago Pressure injury of skin of sacral region, unspecified injury stage   [facility] Jerrol Banana., MD   5 months ago Encounter for [employer] annual wellness exam   [facility] Jerrol Banana., MD   8 months ago Typical atrial flutter [facility])   [facility] Jerrol Banana., MD      Future Appointments            In 2 weeks Gollan, Kathlene November, MD [facility], [facility]

## 2021-02-24 ENCOUNTER — Other Ambulatory Visit: Payer: Self-pay | Admitting: Family Medicine

## 2021-02-24 DIAGNOSIS — E1165 Type 2 diabetes mellitus with hyperglycemia: Secondary | ICD-10-CM

## 2021-02-24 DIAGNOSIS — IMO0002 Reserved for concepts with insufficient information to code with codable children: Secondary | ICD-10-CM

## 2021-02-24 NOTE — Telephone Encounter (Signed)
   Notes to clinic: Review for refill Patient is taking 38 units and not 35 units    Requested Prescriptions  Pending Prescriptions Disp Refills   LANTUS SOLOSTAR 100 UNIT/ML Solostar Pen [Pharmacy Med Name: LANTUS SOLO  PEN 100U/ML] 45 mL 3    Sig: INJECT 35 UNITS INTO THE   SKIN TWO TIMES A DAY      Endocrinology:  Diabetes - Insulins Failed - 02/24/2021  2:20 PM      Failed - HBA1C is between 0 and 7.9 and within 180 days    Hemoglobin A1C  Date Value Ref Range Status  12/08/2020 8.7 (A) 4.0 - 5.6 % Final   Hgb A1c MFr Bld  Date Value Ref Range Status  07/25/2020 7.6 (H) 4.8 - 5.6 % Final    Comment:    (NOTE)         Prediabetes: 5.7 - 6.4         Diabetes: >6.4         Glycemic control for adults with diabetes: <7.0           Passed - Valid encounter within last 6 months    Recent Outpatient Visits           2 months ago Diabetes mellitus type 2, uncontrolled, with complications [facility])   [facility] Jerrol Banana., MD   3 months ago Chronic bilateral low back pain with bilateral sciatica   [facility] Jerrol Banana., MD   4 months ago Pressure injury of skin of sacral region, unspecified injury stage   [facility] Jerrol Banana., MD   5 months ago Encounter for [employer] annual wellness exam   [facility] Jerrol Banana., MD   8 months ago Typical atrial flutter [facility])   [facility] Jerrol Banana., MD       Future Appointments             In 2 weeks Gollan, Kathlene November, MD [facility], [facility]

## 2021-02-25 ENCOUNTER — Other Ambulatory Visit: Payer: Self-pay | Admitting: Family Medicine

## 2021-02-25 DIAGNOSIS — E1165 Type 2 diabetes mellitus with hyperglycemia: Secondary | ICD-10-CM

## 2021-02-25 DIAGNOSIS — IMO0002 Reserved for concepts with insufficient information to code with codable children: Secondary | ICD-10-CM

## 2021-02-25 MED ORDER — VICTOZA 18 MG/3ML ~~LOC~~ SOPN: 1.8000 mg | PEN_INJECTOR | Freq: Every day | SUBCUTANEOUS | 3 refills | Status: DC

## 2021-02-25 NOTE — Telephone Encounter (Signed)
Medication Refill - Medication: liraglutide (VICTOZA) 18 MG/3ML SOPN   Has the patient contacted their pharmacy? No. (Agent: If no, request that the patient contact the pharmacy for the refill.) (Agent: If yes, when and what did the pharmacy advise?)  Preferred Pharmacy (with phone number or street name):  [facility] [place] to [address] [place] [address]  Phone: [phone] Fax: [phone]     Agent: Please be advised that RX refills may take up to 3 business days. We ask that you follow-up with your pharmacy.

## 2021-03-03 ENCOUNTER — Other Ambulatory Visit: Payer: Self-pay

## 2021-03-03 ENCOUNTER — Telehealth (INDEPENDENT_AMBULATORY_CARE_PROVIDER_SITE_OTHER): Payer: Medicare Other | Admitting: Family Medicine

## 2021-03-03 DIAGNOSIS — J418 Mixed simple and mucopurulent chronic bronchitis: Secondary | ICD-10-CM

## 2021-03-03 DIAGNOSIS — E1165 Type 2 diabetes mellitus with hyperglycemia: Secondary | ICD-10-CM

## 2021-03-03 DIAGNOSIS — R059 Cough, unspecified: Secondary | ICD-10-CM

## 2021-03-03 DIAGNOSIS — E118 Type 2 diabetes mellitus with unspecified complications: Secondary | ICD-10-CM

## 2021-03-03 DIAGNOSIS — Z6838 Body mass index (BMI) 38.0-38.9, adult: Secondary | ICD-10-CM | POA: Diagnosis not present

## 2021-03-03 DIAGNOSIS — IMO0002 Reserved for concepts with insufficient information to code with codable children: Secondary | ICD-10-CM

## 2021-03-03 MED ORDER — HYDROCOD POLST-CPM POLST ER 10-8 MG/5ML PO SUER: 5.0000 mL | Freq: Two times a day (BID) | ORAL | 0 refills | Status: DC | PRN

## 2021-03-03 MED ORDER — DOXYCYCLINE HYCLATE 100 MG PO TABS: 100.0000 mg | ORAL_TABLET | Freq: Two times a day (BID) | ORAL | 0 refills | Status: DC

## 2021-03-03 MED ORDER — PREDNISONE 20 MG PO TABS: 20.0000 mg | ORAL_TABLET | Freq: Every day | ORAL | 1 refills | Status: DC

## 2021-03-03 NOTE — Progress Notes (Signed)
MyChart Video Visit    Virtual Visit via Video Note   This visit type was conducted due to national recommendations for restrictions regarding the COVID-19 Pandemic (e.g. social distancing) in an effort to limit this patient's exposure and mitigate transmission in our community. This patient is at least at moderate risk for complications without adequate follow up. This format is felt to be most appropriate for this patient at this time. Physical exam was limited by quality of the video and audio technology used for the visit.   Patient location: Home Provider location: Office This was secured is a virtual visit is unable to see the patient during the visit.  The entire visit was done over the computer. I discussed the limitations of evaluation and management by telemedicine and the availability of in person appointments. The patient expressed understanding and agreed to proceed.  Patient: Jerry Parsons   DOB: 01/25/1951   70 y.o. Male  MRN: [id number] Visit Date: 03/03/2021  Today's healthcare provider: [name], MD   No chief complaint on file.  Subjective    HPI  [place] most of [place].  He was sick went on February 19 he went to the urgent care the patient was given a Z-Pak after tested negative for Covid and the flu.  He did not feel any better on 26 February went back in was found to have influenza A.  Was treated with amoxicillin for 5 days.  His cough is sometimes productive of thick greenish sputum and sometimes not productive.  Has had no fevers no chest tightness and no shortness of breath.  He has chest pain associated with coughing.  He has not had his Covid vaccines and his wife is also tested negative but she is also sick.    Medications: Outpatient Medications Prior to Visit  Medication Sig  . amiodarone (PACERONE) 200 MG tablet Take 1 tablet (200 mg total) by mouth daily.  [name] amoxicillin-clavulanate (AUGMENTIN) 875-125 MG  tablet Take 1 tablet by mouth 2 (two) times daily.  [name] apixaban (ELIQUIS) 5 MG TABS tablet Take 1 tablet (5 mg total) by mouth 2 (two) times daily.  . cyclobenzaprine (FLEXERIL) 10 MG tablet TAKE 1 TABLET BY MOUTH 3 TIMES A DAY (Patient taking differently: Take 10 mg by mouth 3 (three) times daily.)  . dapagliflozin propanediol (FARXIGA) 10 MG TABS tablet Take 1 tablet (10 mg total) by mouth daily.  [name] docusate sodium (COLACE) 100 MG capsule Take 300 mg by mouth daily.  [name] dutasteride (AVODART) 0.5 MG capsule TAKE 1 CAPSULE DAILY (Patient taking differently: Take 0.5 mg by mouth daily.)  . esomeprazole (NEXIUM) 40 MG capsule Take 1 capsule (40 mg total) by mouth 2 (two) times daily.  . ferrous sulfate 325 (65 FE) MG tablet Take by mouth.  . fexofenadine (ALLEGRA) 180 MG tablet Take 180 mg by mouth daily.  . furosemide (LASIX) 40 MG tablet TAKE 1 TABLET (40 MG) BY MOUTH ONCE DAILY, YOU MAY TAKE 1 EXTRA TABLET (40 MG) AFTER LUNCH AS NEEDED FOR LEG SWELLING/ ABDOMINAL TIGHTNESS (Patient taking differently: Take 40 mg by mouth daily.)  . gabapentin (NEURONTIN) 100 MG capsule TAKE 1 CAPSULE BY MOUTH TWICE A DAY (Patient taking differently: Take 100 mg by mouth 2 (two) times daily.)  . hydrochlorothiazide (HYDRODIURIL) 12.5 MG tablet TAKE 2 TABLETS (25 MG TOTAL) BY MOUTH AT BEDTIME.  . Insulin Pen Needle (PEN NEEDLES) 31G X 5 MM MISC 1  each by Does not apply route daily. PATIENT NEEDS NOVA TWIST NEEDLES 5 MM  DX E11.9  . JANUVIA 100 MG tablet TAKE 1 TABLET DAILY  . LANTUS SOLOSTAR 100 UNIT/ML Solostar Pen INJECT 35 UNITS INTO THE   SKIN TWO TIMES A DAY  . liraglutide (VICTOZA) 18 MG/3ML SOPN Inject 1.8 mg into the skin daily.  . magnesium oxide (MAG-OX) 400 MG tablet TAKE 1 TABLET (400 MG TOTAL) BY MOUTH 2 (TWO) TIMES DAILY. (Patient taking differently: Take 400 mg by mouth 2 (two) times daily.)  . MELATONIN PO Take 2 tablets by mouth at bedtime. Gummies  . metoprolol tartrate (LOPRESSOR) 100 MG tablet TAKE  1 TABLET (100 MG TOTAL) BY MOUTH 2 (TWO) TIMES DAILY.  . mupirocin cream (BACTROBAN) 2 % APPLY TO AFFECTED AREA TWICE A DAY  . Naloxone HCl 0.4 MG/0.4ML SOAJ Inject as needed for suspected overdose. (Patient taking differently: Take 0.4 mg by mouth 1 (one) time orthopaedic injection (Over dose).)  . NOVOLOG FLEXPEN 100 UNIT/ML FlexPen INJECT 12 UNITS            SUBCUTANEOUSLY 3 TIMES A   DAY WITH MEALS (Patient taking differently: Inject 14 Units into the skin in the morning and at bedtime.)  . NOVOTWIST 32G X 5 MM MISC USE 6 TIMES A DAY DX E11.9  . Omega-3 Fatty Acids (FISH OIL) 1200 MG CAPS Take 1,200 mg by mouth 2 (two) times daily.   Glory Rosebush ULTRA test strip USE WITH METER TWICE DAILY  . oxyCODONE (OXYCONTIN) 40 mg 12 hr tablet Take 1 tablet (40 mg total) by mouth every 8 (eight) hours as needed.  . pregabalin (LYRICA) 200 MG capsule TAKE 1 CAPSULE 3 TIMES A&amp;nbsp;&amp;nbsp; DAY  . rOPINIRole (REQUIP) 1 MG tablet TAKE 1 TO 2 TABLETS BY MOUTH AT BEDTIME AS NEEDED  . rOPINIRole (REQUIP) 2 MG tablet TAKE 1 TABLET BY MOUTH TWICE A DAY AS NEEDED  . sucralfate (CARAFATE) 1 g tablet TAKE 1 TABLET BY MOUTH 4 (FOUR) TIMES DAILY - WITH MEALS AND AT BEDTIME.  . tamsulosin (FLOMAX) 0.4 MG CAPS capsule Take 0.4 mg by mouth daily.  [name] testosterone cypionate (DEPOTESTOSTERONE CYPIONATE) 200 MG/ML injection Inject 200 mg into the muscle every 21 ( twenty-one) days.    No facility-administered medications prior to visit.    Review of Systems     Objective    There were no vitals taken for this visit.    Physical Exam Vitals reviewed.   Patient is alert and oriented during the visit.  He is in no respiratory distress.  He is breathing easily and answers questions without difficulty.  No obvious wheezing.    Assessment & Plan     1. Mixed simple and mucopurulent chronic bronchitis (HCC) Treat with doxycycline and prednisone 20 mg daily for 5 days.  2. Cough He is given Tussionex for the  cough which is keeping him awake at night. He is tested positive for the flu and negative twice for Covid.  I do not think he has Covid.  3. Class 2 severe obesity due to excess calories with serious comorbidity and body mass index (BMI) of 38.0 to 38.9 in adult ([place])   4. Diabetes mellitus type 2, uncontrolled, with complications ([place]) I advised patient his sugar might go up.  He has been on prednisone before without affecting the sugar adversely.   No follow-ups on file.     I discussed the assessment and treatment plan with the patient. The  patient was provided an opportunity to ask questions and all were answered. The patient agreed with the plan and demonstrated an understanding of the instructions.   The patient was advised to call back or seek an in-person evaluation if the symptoms worsen or if the condition fails to improve as anticipated.  I provided 11 minutes of non-face-to-face time during this encounter.  I, [name], MD, have reviewed all documentation for this visit. The documentation on 03/03/21 for the exam, diagnosis, procedures, and orders are all accurate and complete.   Richard Cranford Mon, MD [facility] [phone] (phone) [phone] (fax)  [place]

## 2021-03-13 ENCOUNTER — Ambulatory Visit (INDEPENDENT_AMBULATORY_CARE_PROVIDER_SITE_OTHER): Payer: Medicare Other | Admitting: Cardiovascular Disease

## 2021-03-13 ENCOUNTER — Encounter: Payer: Self-pay | Admitting: Cardiovascular Disease

## 2021-03-13 ENCOUNTER — Other Ambulatory Visit: Payer: Self-pay

## 2021-03-13 VITALS — BP 140/84 | HR 72 | Ht 71.0 in | Wt 264.0 lb

## 2021-03-13 DIAGNOSIS — I10 Essential (primary) hypertension: Secondary | ICD-10-CM | POA: Diagnosis not present

## 2021-03-13 DIAGNOSIS — E782 Mixed hyperlipidemia: Secondary | ICD-10-CM

## 2021-03-13 DIAGNOSIS — I483 Typical atrial flutter: Secondary | ICD-10-CM | POA: Diagnosis not present

## 2021-03-13 DIAGNOSIS — I5032 Chronic diastolic (congestive) heart failure: Secondary | ICD-10-CM

## 2021-03-13 NOTE — Progress Notes (Signed)
Cardiology Office Note  Date:  03/13/2021   ID:  Jerry Parsons, DOB [birth year], MRN [id number]  PCP:  Jerry Parsons., MD   Chief Complaint  Patient presents with  . Follow-up    6 month F/U-Patient had CXR about 5 weeks ago and was told he has an enlarged heart. He also states he always sweats excessively.    HPI:  Jerry Parsons is a  70 year old gentleman, multiple back surgeries,  spinal stenosis , s/p arthroscopic knee surgery, total knee replacement left obesity,  poorly controlled diabetes ,  DVT, PE in March 2011 previously on warfarin,  30 year smoking history,   neuropathy hypertension,  depression,  renal disease,  obstructive sleep apnea on CPAP,  chronic sweating at nighttime which he attributes to the Vicodin. Prior severe pneumonia, ATN Remote atrial flutter dating back to 2014, fibrillation, last episode in 2015. Not on anticoagulation presenting for routine followup of his hypertension , atrial flutter  Last seen in clinic September 2021  [place], got bronchitis, was sick for weeks Told he tested positive for flu Still with cough, poor sleep Better sleep last night Did not take cough medication, finished prednisone  Echocardiogram June 2021  mild LVH otherwise normal LV size and function  Lab work reviewed Hemoglobin A1c 8.7 up from 7.6 Was on prednisone for bronshitis, now off  Back in May 2021  total cholesterol 134 LDL 67   hospitalization June 05, 2020  atrial flutter  Underwent cardioversion, 09/11/2020 NSR restored  Taking stool softener, Previously heavy straining  No leg swelling monitoring heart   EKG personally reviewed by myself on todays visit NSR rate 77 bpm, no ST or T wave changes  Other past medical hx reviewed Chronic back pain 9 back surgeries, previously thought about  spinal cord stimulator  EKG personally reviewed by myself on todays visit NSR rate 71 bpm, no ST or T wave changes  Admission 5/18  for sepsis, salmonella diarrhea, per the discharge summary Hospital records reviewed with the patient in detail From eggs, per the patient He did not receive antibiotics  pneumonia while at the [facility] January 2017 He was in ICU for 3 days, long recovery Lost more than 30 pounds,  Lipid panel done while he was very ill early 2017, total cholesterol at that time 89  Denies any atrial fibrillation through his hospital course, reports he was changed to amiodarone IV infusion and then back to pill when he was tolerating oral medications  Previous event where he had buckling of his knee, fall, contusion, TIA-type symptoms that took him to the hospital. He was kept overnight and most of his workup was relatively unrevealing including MRI/MRA, carotid ultrasound, echocardiogram.  History of atrial flutter, status post cardioversion on 03/14/2013.   2 admissions to the hospital for gastroenteritis/ campylobacter infection. Required a long course of antibiotics. Possible  infection from tainted chicken.   PMH:   has a past medical history of Arrhythmia, Arthritis, Blind right eye, BPH (benign prostatic hyperplasia), Diabetes mellitus, DVT (deep venous thrombosis) ([place]) (02/2010), Dyspnea, Dysrhythmia, 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, GERD (gastroesophageal reflux disease), Headache, Hypercholesteremia, Hypertension, Kidney failure, Neuromuscular disorder ([place]), Neuropathy, Pneumonia, Pulmonary embolus ([place]) (2011), Seasonal allergies, Seizures ([place]), Sleep apnea, Stiff neck, TIA (transient ischemic attack), and Wears dentures.  PSH:    Past Surgical History:  Procedure Laterality Date  . BACK SURGERY     x 8; upper x 3 & lower x 5  .  CARDIOVERSION  03/14/13, 10/16   2014 - [place], 2016 - [place]  . CARDIOVERSION N/A 09/11/2020   Procedure: CARDIOVERSION;  Surgeon: Jerry Merritts, MD;  Location: [facility] ORS;  Service: Cardiovascular;  Laterality: N/A;  . CATARACT EXTRACTION W/PHACO Left  10/29/2015   Procedure: CATARACT EXTRACTION PHACO AND INTRAOCULAR LENS PLACEMENT ([place]);  Surgeon: Jerry Koyanagi, MD;  Location: [place];  Service: Ophthalmology;  Laterality: Left;  DIABETIC - insulin and oral medsSleep apnea - no machine  . CHOLECYSTECTOMY    . COLONOSCOPY WITH PROPOFOL N/A 01/16/2018   Procedure: COLONOSCOPY WITH PROPOFOL;  Surgeon: Jerry Silvas, MD;  Location: [facility] ENDOSCOPY;  Service: Endoscopy;  Laterality: N/A;  . ESOPHAGOGASTRODUODENOSCOPY (EGD) WITH PROPOFOL N/A 01/16/2018   Procedure: ESOPHAGOGASTRODUODENOSCOPY (EGD) WITH PROPOFOL;  Surgeon: Jerry Silvas, MD;  Location: [facility] ENDOSCOPY;  Service: Endoscopy;  Laterality: N/A;  . EYE SURGERY    . GALLBLADDER SURGERY  2002  . JOINT REPLACEMENT Right 2018  . KNEE ARTHROSCOPY     left   . PARATHYROIDECTOMY N/A 07/25/2020   Procedure: PARATHYROIDECTOMY;  Surgeon: Jerry Maudlin, MD;  Location: [facility] ORS;  Service: General;  Laterality: N/A;  . ROTATOR CUFF REPAIR  2001   left   . TEE WITHOUT CARDIOVERSION N/A 04/26/2018   Procedure: TRANSESOPHAGEAL ECHOCARDIOGRAM (TEE);  Surgeon: Jerry Bush, MD;  Location: [facility] ORS;  Service: Cardiovascular;  Laterality: N/A;  . TONSILLECTOMY    . TOTAL KNEE ARTHROPLASTY Right 06/20/2017   Procedure: RIGHT TOTAL KNEE ARTHROPLASTY;  Surgeon: Jerry Arabian, MD;  Location: [facility] ORS;  Service: Orthopedics;  Laterality: Right;    Current Outpatient Medications  Medication Sig Dispense Refill  . amiodarone (PACERONE) 200 MG tablet Take 1 tablet (200 mg total) by mouth daily. 90 tablet 1  . apixaban (ELIQUIS) 5 MG TABS tablet Take 1 tablet (5 mg total) by mouth 2 (two) times daily. 180 tablet 3  . cyclobenzaprine (FLEXERIL) 10 MG tablet TAKE 1 TABLET BY MOUTH 3 TIMES A DAY 90 tablet 12  . dapagliflozin propanediol (FARXIGA) 10 MG TABS tablet Take 1 tablet (10 mg total) by mouth daily. 90 tablet 3  . docusate sodium (COLACE) 100 MG capsule Take 300 mg by mouth daily.     [name] dutasteride (AVODART) 0.5 MG capsule TAKE 1 CAPSULE DAILY 90 capsule 3  . esomeprazole (NEXIUM) 40 MG capsule Take 1 capsule (40 mg total) by mouth 2 (two) times daily. 180 capsule 3  . ferrous sulfate 325 (65 FE) MG tablet Take by mouth.    . fexofenadine (ALLEGRA) 180 MG tablet Take 180 mg by mouth daily.    . furosemide (LASIX) 40 MG tablet TAKE 1 TABLET (40 MG) BY MOUTH ONCE DAILY, YOU MAY TAKE 1 EXTRA TABLET (40 MG) AFTER LUNCH AS NEEDED FOR LEG SWELLING/ ABDOMINAL TIGHTNESS 180 tablet 2  . gabapentin (NEURONTIN) 100 MG capsule TAKE 1 CAPSULE BY MOUTH TWICE A DAY 180 capsule 2  . hydrochlorothiazide (HYDRODIURIL) 12.5 MG tablet TAKE 2 TABLETS (25 MG TOTAL) BY MOUTH AT BEDTIME. 180 tablet 0  . Insulin Aspart (NOVOLOG FLEXPEN [place]) Inject 14 Units into the skin in the morning and at bedtime.    . Insulin Glargine (LANTUS SOLOSTAR ) Inject 40 Units into the skin 2 (two) times daily.    . Insulin Pen Needle (PEN NEEDLES) 31G X 5 MM MISC 1 each by Does not apply route daily. PATIENT NEEDS NOVA TWIST NEEDLES 5 MM  DX E11.9 200 each 3  . JANUVIA 100 MG  tablet TAKE 1 TABLET DAILY 90 tablet 3  . liraglutide (VICTOZA) 18 MG/3ML SOPN Inject 1.8 mg into the skin daily. 27 mL 3  . magnesium oxide (MAG-OX) 400 MG tablet TAKE 1 TABLET (400 MG TOTAL) BY MOUTH 2 (TWO) TIMES DAILY. 180 tablet 3  . MELATONIN PO Take 2 tablets by mouth at bedtime. Gummies    . metoprolol tartrate (LOPRESSOR) 100 MG tablet TAKE 1 TABLET (100 MG TOTAL) BY MOUTH 2 (TWO) TIMES DAILY. 60 tablet 11  . mupirocin cream (BACTROBAN) 2 % APPLY TO AFFECTED AREA TWICE A DAY 30 g 0  . Naloxone HCl 0.4 MG/0.4ML SOAJ Inject as needed for suspected overdose. 0.4 mL 2  . NOVOTWIST 32G X 5 MM MISC USE 6 TIMES A DAY DX E11.9 100 each 9  . Omega-3 Fatty Acids (FISH OIL PO) Take 1 capsule by mouth in the morning and at bedtime.    . Omega-3 Fatty Acids (FISH OIL) 1200 MG CAPS Take 1,200 mg by mouth 2 (two) times daily.     Glory Rosebush ULTRA  test strip USE WITH METER TWICE DAILY 100 strip 10  . oxyCODONE (OXYCONTIN) 40 mg 12 hr tablet Take 1 tablet (40 mg total) by mouth every 8 (eight) hours as needed. 270 tablet 0  . pregabalin (LYRICA) 200 MG capsule TAKE 1 CAPSULE 3 TIMES A   DAY 270 capsule 1  . rOPINIRole (REQUIP) 1 MG tablet TAKE 1 TO 2 TABLETS BY MOUTH AT BEDTIME AS NEEDED 180 tablet 0  . rOPINIRole (REQUIP) 2 MG tablet TAKE 1 TABLET BY MOUTH TWICE A DAY AS NEEDED 60 tablet 2  . sucralfate (CARAFATE) 1 g tablet TAKE 1 TABLET BY MOUTH 4 (FOUR) TIMES DAILY - WITH MEALS AND AT BEDTIME. 360 tablet 1  . tamsulosin (FLOMAX) 0.4 MG CAPS capsule Take 0.4 mg by mouth daily.    [name] testosterone cypionate (DEPOTESTOSTERONE CYPIONATE) 200 MG/ML injection Inject 200 mg into the muscle every 21 ( twenty-one) days.      No current facility-administered medications for this visit.    Allergies:   Carisoprodol and Tamsulosin   Social History:  The patient  reports that he quit smoking about 31 years ago. His smoking use included cigarettes. He has a 40.00 pack-year smoking history. He has never used smokeless tobacco. He reports that he does not drink alcohol and does not use drugs.   Family History:   family history includes Heart attack in his brother.    Review of Systems: Review of Systems  Constitutional: Negative.   HENT: Negative.   Respiratory: Negative.   Cardiovascular: Positive for leg swelling.  Gastrointestinal: Negative.   Musculoskeletal: Positive for back pain and joint pain.       Tingling foot pain  Neurological: Negative.   Psychiatric/Behavioral: Negative.   All other systems reviewed and are negative.   PHYSICAL EXAM: VS:  BP 140/84 (BP Location: Left Arm, Patient Position: Sitting, Cuff Size: Large)   Pulse 72   Ht 5\' 11"  (1.803 m)   Wt 264 lb (119.7 kg)   SpO2 94%   BMI 36.82 kg/m  , BMI Body mass index is 36.82 kg/m. Constitutional:  oriented to person, place, and time. No distress.  HENT:  Head:  Grossly normal Eyes:  no discharge. No scleral icterus.  Neck: No JVD, no carotid bruits  Cardiovascular: Regular rate and rhythm, no murmurs appreciated Pulmonary/Chest: Clear to auscultation bilaterally, no wheezes or rails Abdominal: Soft.  no distension.  no tenderness.  Musculoskeletal: Normal range of motion Neurological:  normal muscle tone. Coordination normal. No atrophy Skin: Skin warm and dry Psychiatric: normal affect, pleasant  Recent Labs: 06/05/2020: ALT 28 09/02/2020: TSH 0.145 09/09/2020: BUN 21; Creatinine, Ser 1.18; Hemoglobin 13.6; Platelets 146; Potassium 3.8; Sodium 139    Lipid Panel Lab Results  Component Value Date   CHOL 134 05/15/2020   HDL 29 (L) 05/15/2020   LDLCALC 67 05/15/2020   TRIG 233 (H) 05/15/2020      Wt Readings from Last 3 Encounters:  03/13/21 264 lb (119.7 kg)  12/08/20 269 lb (122 kg)  11/04/20 270 lb (122.5 kg)     ASSESSMENT AND PLAN:  Atrial flutter, unspecified type ([place]) - Plan: EKG 12-Lead Maintaining normal sinus rhythm,prior cardioversion On  anticoagulation, metoprolol, amiodarone  HYPERTENSION, BENIGN -  Blood pressure is well controlled on today's visit. No changes made to the medications.  Paroxysmal atrial fibrillation (HCC) - Plan: EKG 12-Lead On amiodarone and Eliquis, metoprolol With atrial flutter on EKG since June 2021 Breathing stable  Hyperlipidemia Cholesterol is at goal on the current lipid regimen. No changes to the medications were made.  Chronic diastolic CHF (congestive heart failure) (HCC) Teds, appears euvolemic  Uncontrolled type 2 diabetes mellitus with complication, with long-term current Unable to exercise, suggeste dstrict diet Recent jump in numbers secondary to prednisone  Primary osteoarthritis of both knees Unable to exercise  Neuropathy chronic pain History of fusions   Total encounter time more than 25 minutes  Greater than 50% was spent in counseling and coordination of care  with the patient    Orders Placed This Encounter  Procedures  . EKG 12-Lead     Signed, Esmond Plants, M.D., Ph.D. 03/13/2021  [place], [place]

## 2021-03-13 NOTE — Patient Instructions (Signed)
Medication Instructions:  No changes  If you need a refill on your cardiac medications before your next appointment, please call your pharmacy.    Lab work: No new labs needed   If you have labs (blood work) drawn today and your tests are completely normal, you will receive your results only by: . MyChart Message (if you have MyChart) OR . A paper copy in the mail If you have any lab test that is abnormal or we need to change your treatment, we will call you to review the results.   Testing/Procedures: No new testing needed   Follow-Up: At [facility], you and your health needs are our priority.  As part of our continuing mission to provide you with exceptional heart care, we have created designated Provider Care Teams.  These Care Teams include your primary Cardiologist (physician) and Advanced Practice Providers (APPs -  Physician Assistants and Nurse Practitioners) who all work together to provide you with the care you need, when you need it.  . You will need a follow up appointment in 12 months  . Providers on your designated Care Team:   . Christopher Berge, NP . Ryan Dunn, PA-C . Jacquelyn Visser, PA-C  Any Other Special Instructions Will Be Listed Below (If Applicable).  COVID-19 Vaccine Information can be found at: [email or web address] For questions related to vaccine distribution or appointments, please email [email or web address] or call [phone].     

## 2021-03-17 ENCOUNTER — Other Ambulatory Visit: Payer: Self-pay | Admitting: Family Medicine

## 2021-03-19 ENCOUNTER — Other Ambulatory Visit: Payer: Self-pay | Admitting: Family Medicine

## 2021-03-19 DIAGNOSIS — IMO0002 Reserved for concepts with insufficient information to code with codable children: Secondary | ICD-10-CM

## 2021-03-19 DIAGNOSIS — E1165 Type 2 diabetes mellitus with hyperglycemia: Secondary | ICD-10-CM

## 2021-03-19 MED ORDER — VICTOZA 18 MG/3ML ~~LOC~~ SOPN: 1.8000 mg | PEN_INJECTOR | Freq: Every day | SUBCUTANEOUS | 3 refills | Status: DC

## 2021-03-19 NOTE — Telephone Encounter (Signed)
Wife called and asked if the pt's liraglutide (VICTOZA) 18 MG/3ML SOPN Can be re sent to [facility]?  [facility].

## 2021-03-19 NOTE — Telephone Encounter (Signed)
S[facility] to [facility] as patient requested . [facility] and noted price increase of medication. Patient last seen in office 2 weeks ago

## 2021-03-21 ENCOUNTER — Other Ambulatory Visit: Payer: Self-pay | Admitting: Family Medicine

## 2021-03-21 NOTE — Telephone Encounter (Signed)
Approved per protocol. No future visit in chart-90 day supply only

## 2021-03-23 ENCOUNTER — Other Ambulatory Visit: Payer: Self-pay | Admitting: Family Medicine

## 2021-03-23 DIAGNOSIS — G629 Polyneuropathy, unspecified: Secondary | ICD-10-CM

## 2021-03-23 NOTE — Telephone Encounter (Signed)
Requested medication (s) are due for refill today -yes  Requested medication (s) are on the active medication list -yes  Future visit scheduled -no  Last refill: 12/20/20  Notes to clinic: Rx passes protocol- shows high risk duplicate warning with Lyrica Rx  Requested Prescriptions  Pending Prescriptions Disp Refills   gabapentin (NEURONTIN) 100 MG capsule [Pharmacy Med Name: GABAPENTIN 100 MG CAPSULE] 180 capsule 0    Sig: TAKE 1 [place]      Neurology: Anticonvulsants - gabapentin Passed - 03/23/2021  1:56 PM      Passed - Valid encounter within last 12 months    Recent Outpatient Visits           2 weeks ago Mixed simple and mucopurulent chronic bronchitis ([place])   [facility] Jerrol Banana., MD   3 months ago Diabetes mellitus type 2, uncontrolled, with complications [facility])   [facility] Jerrol Banana., MD   4 months ago Chronic bilateral low back pain with bilateral sciatica   [facility] Jerrol Banana., MD   5 months ago Pressure injury of skin of sacral region, unspecified injury stage   [facility] Ban[facility]   6 months ago Encounter for [employer] annual wellness exam   [facility] Jerrol Banana., MD                    Requested Prescriptions  Pending Prescriptions Disp Refills   gabapentin (NEURONTIN) 100 MG capsule [Pharmacy Med Name: GABAPENTIN 100 MG CAPSULE] 180 capsule 0    Sig: TAKE 1 [place]      Neurology: Anticonvulsants - gabapentin Passed - 03/23/2021  1:56 PM      Passed - Valid encounter within last 12 months    Recent Outpatient Visits           2 weeks ago Mixed simple and mucopurulent chronic bronchitis ([place])   [facility] Jerrol Banana., MD   3 months ago Diabetes mellitus type 2, uncontrolled, with complications [facility])   [facility]  Jerrol Banana., MD   4 months ago Chronic bilateral low back pain with bilateral sciatica   [facility] Jerrol Banana., MD   5 months ago Pressure injury of skin of sacral region, unspecified injury stage   [facility] Jerrol Banana., MD   6 months ago Encounter for [employer] annual wellness exam   [facility] Jerrol Banana., MD

## 2021-03-26 ENCOUNTER — Other Ambulatory Visit: Payer: Self-pay | Admitting: Family Medicine

## 2021-03-26 ENCOUNTER — Other Ambulatory Visit: Payer: Self-pay

## 2021-03-26 ENCOUNTER — Ambulatory Visit (INDEPENDENT_AMBULATORY_CARE_PROVIDER_SITE_OTHER): Payer: Medicare Other | Admitting: Sports Medicine

## 2021-03-26 ENCOUNTER — Encounter: Payer: Self-pay | Admitting: Sports Medicine

## 2021-03-26 DIAGNOSIS — I739 Peripheral vascular disease, unspecified: Secondary | ICD-10-CM | POA: Diagnosis not present

## 2021-03-26 DIAGNOSIS — M79674 Pain in right toe(s): Secondary | ICD-10-CM

## 2021-03-26 DIAGNOSIS — E1142 Type 2 diabetes mellitus with diabetic polyneuropathy: Secondary | ICD-10-CM

## 2021-03-26 DIAGNOSIS — G2581 Restless legs syndrome: Secondary | ICD-10-CM

## 2021-03-26 DIAGNOSIS — B351 Tinea unguium: Secondary | ICD-10-CM | POA: Diagnosis not present

## 2021-03-26 DIAGNOSIS — M79675 Pain in left toe(s): Secondary | ICD-10-CM

## 2021-03-26 NOTE — Progress Notes (Signed)
Subjective: Jerry Parsons is a 70 y.o. male patient with history of diabetes who presents to office today complaining of long,mildly painful nails while ambulating in shoes; unable to trim.  Patient states that the glucose has been doing good around 130s. No new problems.  Last PCP visit a few months ago.  Patient Active Problem List   Diagnosis Date Noted  . S/P parathyroidectomy ([place]) 07/25/2020  . Primary hyperparathyroidism ([place])   . Loss of consciousness ([place]) 06/05/2020  . Osteoarthritis of both knees 11/11/2018  . OSA on CPAP 10/14/2018  . MRSA bacteremia 05/19/2018  . Lumbar spondylosis 05/05/2018  . Spinal stenosis of lumbar region 05/05/2018  . Constipation, chronic 03/08/2018  . Pain in limb 03/03/2018  . Gastroesophageal reflux disease without esophagitis 01/04/2018  . OA (osteoarthritis) of knee 06/20/2017  . Sepsis due to pneumonia ([place]) 05/07/2017  . Neuropathy 10/13/2016  . Amblyopia 12/30/2015  . 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 NS (nuclear sclerosis) 12/30/2015  . Pseudoaphakia 12/30/2015  . Dehydration 09/08/2015  . Aspiration pneumonia ([place]) 07/04/2015  . Paroxysmal atrial fibrillation ([place]) 07/04/2015  . Arthritis of knee, degenerative 05/28/2015  . Chronic back pain 09/17/2014  . Leg edema 05/07/2014  . Chronic diastolic CHF (congestive heart failure) ([place]) 05/07/2014  . Campylobacter diarrhea 04/25/2013  . Atrial flutter ([place]) 01/23/2013  . Obesity 05/19/2012  . Hyperlipidemia 11/11/2011  . Diastolic dysfunction 94/17/4081  . SOB (shortness of breath) 04/12/2011  . Diabetes mellitus type 2, uncontrolled, with complications ([place]) 44/81/8563  . HYPERTENSION, BENIGN 03/15/2011  . DVT 03/15/2011  . TACHYCARDIA 03/15/2011   Current Outpatient Medications on File Prior to Visit  Medication Sig Dispense Refill  . amiodarone (PACERONE) 200 MG tablet Take 1 tablet (200 mg total) by mouth daily. 90 tablet 1  . apixaban (ELIQUIS) 5 MG TABS tablet Take 1 tablet (5  mg total) by mouth 2 (two) times daily. 180 tablet 3  . cyclobenzaprine (FLEXERIL) 10 MG tablet TAKE 1 TABLET BY MOUTH 3 TIMES A DAY 90 tablet 12  . dapagliflozin propanediol (FARXIGA) 10 MG TABS tablet Take 1 tablet (10 mg total) by mouth daily. 90 tablet 3  . docusate sodium (COLACE) 100 MG capsule Take 300 mg by mouth daily.    [name] dutasteride (AVODART) 0.5 MG capsule TAKE 1 CAPSULE DAILY 90 capsule 3  . esomeprazole (NEXIUM) 40 MG capsule Take 1 capsule (40 mg total) by mouth 2 (two) times daily. 180 capsule 3  . ferrous sulfate 325 (65 FE) MG tablet Take by mouth.    . fexofenadine (ALLEGRA) 180 MG tablet Take 180 mg by mouth daily.    . furosemide (LASIX) 40 MG tablet TAKE 1 TABLET (40 MG) BY MOUTH ONCE DAILY, YOU MAY TAKE 1 EXTRA TABLET (40 MG) AFTER LUNCH AS NEEDED FOR LEG SWELLING/ ABDOMINAL TIGHTNESS 180 tablet 2  . gabapentin (NEURONTIN) 100 MG capsule TAKE 1 CAPSULE BY MOUTH TWICE A DAY 180 capsule 1  . hydrochlorothiazide (HYDRODIURIL) 12.5 MG tablet TAKE 2 TABLETS (25 MG TOTAL) BY MOUTH AT BEDTIME. 180 tablet 0  . Insulin Aspart (NOVOLOG FLEXPEN [place]) Inject 14 Units into the skin in the morning and at bedtime.    . Insulin Glargine (LANTUS SOLOSTAR [place]) Inject 40 Units into the skin 2 (two) times daily.    . Insulin Pen Needle (PEN NEEDLES) 31G X 5 MM MISC 1 each by Does not apply route daily. PATIENT NEEDS NOVA TWIST NEEDLES 5 MM  DX E11.9 200 each 3  . JANUVIA  100 MG tablet TAKE 1 TABLET DAILY 90 tablet 3  . liraglutide (VICTOZA) 18 MG/3ML SOPN Inject 1.8 mg into the skin daily. 27 mL 3  . magnesium oxide (MAG-OX) 400 MG tablet TAKE 1 TABLET (400 MG TOTAL) BY MOUTH 2 (TWO) TIMES DAILY. 180 tablet 3  . MELATONIN PO Take 2 tablets by mouth at bedtime. Gummies    . metoprolol tartrate (LOPRESSOR) 100 MG tablet TAKE 1 TABLET (100 MG TOTAL) BY MOUTH 2 (TWO) TIMES DAILY. 60 tablet 2  . mupirocin cream (BACTROBAN) 2 % APPLY TO AFFECTED AREA TWICE A DAY 30 g 0  . Naloxone HCl 0.4 MG/0.4ML  SOAJ Inject as needed for suspected overdose. 0.4 mL 2  . NOVOTWIST PEN NEEDLE 32G X 5 MM MISC USE 6 TIMES A DAY DX E11.9 100 each 1  . Omega-3 Fatty Acids (FISH OIL PO) Take 1 capsule by mouth in the morning and at bedtime.    . Omega-3 Fatty Acids (FISH OIL) 1200 MG CAPS Take 1,200 mg by mouth 2 (two) times daily.     Glory Rosebush ULTRA test strip USE WITH METER TWICE DAILY 100 strip 10  . oxyCODONE (OXYCONTIN) 40 mg 12 hr tablet Take 1 tablet (40 mg total) by mouth every 8 (eight) hours as needed. 270 tablet 0  . pregabalin (LYRICA) 200 MG capsule TAKE 1 CAPSULE 3 TIMES A   DAY 270 capsule 1  . rOPINIRole (REQUIP) 1 MG tablet TAKE 1 TO 2 TABLETS BY MOUTH AT BEDTIME AS NEEDED 180 tablet 0  . sucralfate (CARAFATE) 1 g tablet TAKE 1 TABLET BY MOUTH 4 (FOUR) TIMES DAILY - WITH MEALS AND AT BEDTIME. 360 tablet 1  . tamsulosin (FLOMAX) 0.4 MG CAPS capsule Take 0.4 mg by mouth daily.    [name] testosterone cypionate (DEPOTESTOSTERONE CYPIONATE) 200 MG/ML injection Inject 200 mg into the muscle every 21 ( twenty-one) days.      No current facility-administered medications on file prior to visit.   Allergies  Allergen Reactions  . Carisoprodol Itching  . Tamsulosin     Pt stated, "upsets my stomach"    Objective: General: Patient is awake, alert, and oriented x 3 and in no acute distress.  Integument: Skin is warm, dry and supple bilateral. Nails are tender, long, thickened and dystrophic with subungual debris, consistent with onychomycosis, 1-5 bilateral. No open lesions or preulcerative lesions present bilateral. Remaining integument unremarkable. .  Vasculature:  Dorsalis Pedis pulse 0/4 bilateral. Posterior Tibial pulse  0/4 bilateral. Capillary fill time <3 sec 1-5 bilateral. No hair growth to the level of the digits.Temperature gradient within normal limits. No varicosities present bilateral. 1+ pitting edema present bilateral. +Stucco dermatitis due to PVD without infection.   Neurology: The  patient has absent sensation measured with a 5.07/10g Semmes Weinstein Monofilament at all pedal sites bilateral, unchanged from prior  Musculoskeletal: Asymptomatic hammertoes pedal deformities noted bilateral R>L.  Muscular strength 5/5 in all lower extremity muscular groups bilateral without pain on range of motion. No tenderness with calf compression bilateral.  Assessment and Plan: Problem List Items Addressed This Visit   None   Visit Diagnoses    Pain due to onychomycosis of toenails of both feet    -  Primary   Diabetic polyneuropathy associated with type 2 diabetes mellitus (HCC)       PAD (peripheral artery disease) ([place])         -Examined patient. -Re-Discussed and educated patient on diabetic foot care, especially with  regards  to the vascular, neurological and musculoskeletal systems.  -Mechanically debrided all nails 1-5 bilateral using sterile nail nipper and filed with dremel without incident  -Answered all patient questions -Patient to return  in 2.5 to 3 months for at risk foot care or sooner if problems arise.  Landis Martins, DPM

## 2021-03-26 NOTE — Telephone Encounter (Signed)
Requested Prescriptions  Pending Prescriptions Disp Refills  . rOPINIRole (REQUIP) 2 MG tablet [Pharmacy Med Name: ROPINIROLE HCL 2 MG TABLET] 180 tablet 2    Sig: TAKE 1 TABLET BY MOUTH TWICE A DAY AS NEEDED     Neurology:  Parkinsonian Agents Failed - 03/26/2021  2:05 AM      Failed - Last BP in normal range    BP Readings from Last 1 Encounters:  03/13/21 140/84         Passed - Valid encounter within last 12 months    Recent Outpatient Visits          3 weeks ago Mixed simple and mucopurulent chronic bronchitis [facility])   [facility] Jerrol Banana., MD   3 months ago Diabetes mellitus type 2, uncontrolled, with complications [place] [facility])   [facility] Jerrol Banana., MD   4 months ago Chronic bilateral low back pain with bilateral sciatica   [facility] Jerrol Banana., MD   5 months ago Pressure injury of skin of sacral region, unspecified injury stage   [facility] Jerrol Banana., MD   6 months ago Encounter for [employer] annual wellness exam   [facility] Jerrol Banana., MD

## 2021-03-27 ENCOUNTER — Other Ambulatory Visit: Payer: Self-pay

## 2021-03-27 DIAGNOSIS — R059 Cough, unspecified: Secondary | ICD-10-CM

## 2021-04-06 ENCOUNTER — Other Ambulatory Visit: Payer: Self-pay | Admitting: Family Medicine

## 2021-04-07 ENCOUNTER — Other Ambulatory Visit: Payer: Self-pay | Admitting: Family Medicine

## 2021-04-07 ENCOUNTER — Other Ambulatory Visit: Payer: Self-pay | Admitting: Cardiovascular Disease

## 2021-04-07 DIAGNOSIS — R110 Nausea: Secondary | ICD-10-CM

## 2021-04-07 NOTE — Telephone Encounter (Signed)
Rx request sent to pharmacy.  

## 2021-04-15 DIAGNOSIS — N5201 Erectile dysfunction due to arterial insufficiency: Secondary | ICD-10-CM | POA: Diagnosis not present

## 2021-04-15 DIAGNOSIS — Z79899 Other long term (current) drug therapy: Secondary | ICD-10-CM | POA: Diagnosis not present

## 2021-04-15 DIAGNOSIS — E291 Testicular hypofunction: Secondary | ICD-10-CM | POA: Diagnosis not present

## 2021-04-17 DIAGNOSIS — E291 Testicular hypofunction: Secondary | ICD-10-CM | POA: Diagnosis not present

## 2021-04-17 DIAGNOSIS — N401 Enlarged prostate with lower urinary tract symptoms: Secondary | ICD-10-CM | POA: Diagnosis not present

## 2021-04-17 DIAGNOSIS — Z79899 Other long term (current) drug therapy: Secondary | ICD-10-CM | POA: Diagnosis not present

## 2021-04-29 ENCOUNTER — Other Ambulatory Visit: Payer: Self-pay | Admitting: Family Medicine

## 2021-04-29 DIAGNOSIS — M48061 Spinal stenosis, lumbar region without neurogenic claudication: Secondary | ICD-10-CM

## 2021-04-29 NOTE — Telephone Encounter (Signed)
Medication: oxyCODONE (OXYCONTIN) 40 mg 12 hr tablet 90 day supply Has the pt contacted their pharmacy? no Preferred pharmacy: [place], [place] to Registered [facility] Sites    Please be advised refills may take up to 3 business days.  We ask that you follow up with your pharmacy.

## 2021-04-29 NOTE — Telephone Encounter (Signed)
Requested medication (s) are due for refill today:  Provider to review  Requested medication (s) are on the active medication list:   Yes  Future visit scheduled:   Yes in 5 days with Dr. [name]   Last ordered: 02/05/2021 #270, 0 refills  Non delegated refill    Requested Prescriptions  Pending Prescriptions Disp Refills   oxyCODONE (OXYCONTIN) 40 mg 12 hr tablet 270 tablet 0    Sig: Take 1 tablet (40 mg total) by mouth every 8 (eight) hours as needed.      Not Delegated - Analgesics:  Opioid Agonists Failed - 04/29/2021  1:41 PM      Failed - This refill cannot be delegated      Failed - Urine Drug Screen completed in last 360 days      Passed - Valid encounter within last 6 months    Recent Outpatient Visits           1 month ago Mixed simple and mucopurulent chronic bronchitis ([place])   [facility] Jerrol [facility] MD   4 months ago Diabetes mellitus type 2, uncontrolled, with complications [facility])   [facility] Jerrol Banana., MD   5 months ago Chronic bilateral low back pain with bilateral sciatica   [facility] Jerrol Banana., MD   6 months ago Pressure injury of skin of sacral region, unspecified injury stage   [facility] Jerrol Banana., MD   7 months ago Encounter for [employer] annual wellness exam   [facility] Jerrol Banana., MD       Future Appointments             In 5 days Jerrol Banana., MD [facility] [place]

## 2021-04-30 DIAGNOSIS — M65332 Trigger finger, left middle finger: Secondary | ICD-10-CM | POA: Diagnosis not present

## 2021-04-30 MED ORDER — OXYCODONE HCL ER 40 MG PO T12A: 40.0000 mg | EXTENDED_RELEASE_TABLET | Freq: Three times a day (TID) | ORAL | 0 refills | Status: DC | PRN

## 2021-05-01 NOTE — Progress Notes (Signed)
Established patient visit   Patient: Jerry Parsons   DOB: 1951/08/15   70 y.o. Male  MRN: [id number] Visit Date: 05/04/2021  Today's healthcare provider: [name], MD   Chief Complaint  Patient presents with  . Diabetes  . Hypertension   Subjective    HPI   Patient in today for follow-up.  In February he had bronchitis and influenza A.  Had a cough since then.  He is also had some occasional dizziness where the room is spinning.  Allergies seem to have been worse.  He is taking insulin 5 times a day and is interested in a continuous glucose monitor which is completely appropriate.  He continues have chronic back pain and chronic neuropathy. Diabetes Mellitus Type II, follow-up  Lab Results  Component Value Date   HGBA1C 7.6 (A) 05/04/2021   HGBA1C 8.7 (A) 12/08/2020   HGBA1C 7.6 (H) 07/25/2020   Last seen for diabetes 5 months ago.  Management since then includes continuing the same treatment. He reports good compliance with treatment. He is not having side effects.   Home blood sugar records: trend: stable  Episodes of hypoglycemia? No    Current insulin regiment: none Most Recent Eye Exam: 02/15/2020  Hypertension, follow-up  BP Readings from Last 3 Encounters:  03/13/21 140/84  12/08/20 (!) 137/59  11/04/20 126/70   Wt Readings from Last 3 Encounters:  03/13/21 264 lb (119.7 kg)  12/08/20 269 lb (122 kg)  11/04/20 270 lb (122.5 kg)     He was last seen for hypertension 5 months ago.  BP at that visit was 137/59. Management since that visit includes; on metoprolol and HCTZ. He reports good compliance with treatment. He is not having side effects.  He is not exercising. He is adherent to low salt diet.   Outside blood pressures are checked occasionally.  He does not smoke.  Use of agents associated with hypertension: none.   Orthostatic VS for the past 24 hrs (Last 3 readings):  BP- Lying Pulse- Lying BP- Sitting Pulse- Sitting BP-  Standing at 0 minutes Pulse- Standing at 0 minutes  05/04/21 1116 128/70 62 114/69 64 118/71 66        Medications: Outpatient Medications Prior to Visit  Medication Sig  . amiodarone (PACERONE) 200 MG tablet Take 1 tablet (200 mg total) by mouth daily.  [name] apixaban (ELIQUIS) 5 MG TABS tablet Take 1 tablet (5 mg total) by mouth 2 (two) times daily.  . cyclobenzaprine (FLEXERIL) 10 MG tablet TAKE 1 TABLET BY MOUTH 3 TIMES A DAY  . docusate sodium (COLACE) 100 MG capsule Take 300 mg by mouth daily.  [name] dutasteride (AVODART) 0.5 MG capsule TAKE 1 CAPSULE DAILY  . esomeprazole (NEXIUM) 40 MG capsule Take 1 capsule (40 mg total) by mouth 2 (two) times daily.  [name] FARXIGA 10 MG TABS tablet TAKE 1 TABLET BY MOUTH EVERY DAY  . ferrous sulfate 325 (65 FE) MG tablet Take by mouth.  . fexofenadine (ALLEGRA) 180 MG tablet Take 180 mg by mouth daily.  . furosemide (LASIX) 40 MG tablet TAKE 1 TABLET (40 MG) BY MOUTH ONCE DAILY, YOU MAY TAKE 1 EXTRA TABLET (40 MG) AFTER LUNCH AS NEEDED FOR LEG SWELLING/ ABDOMINAL TIGHTNESS  . gabapentin (NEURONTIN) 100 MG capsule TAKE 1 CAPSULE BY MOUTH TWICE A DAY  . hydrochlorothiazide (HYDRODIURIL) 12.5 MG tablet TAKE 2 TABLETS (25 MG TOTAL) BY MOUTH AT BEDTIME.  . Insulin Aspart (NOVOLOG FLEXPEN [place]) Inject  14 Units into the skin in the morning and at bedtime.  . Insulin Glargine (LANTUS SOLOSTAR [place]) Inject 40 Units into the skin 2 (two) times daily.  . Insulin Pen Needle (PEN NEEDLES) 31G X 5 MM MISC 1 each by Does not apply route daily. PATIENT NEEDS NOVA TWIST NEEDLES 5 MM  DX E11.9  . JANUVIA 100 MG tablet TAKE 1 TABLET DAILY  . liraglutide (VICTOZA) 18 MG/3ML SOPN Inject 1.8 mg into the skin daily.  . magnesium oxide (MAG-OX) 400 MG tablet TAKE 1 TABLET (400 MG TOTAL) BY MOUTH 2 (TWO) TIMES DAILY.  [name] MELATONIN PO Take 2 tablets by mouth at bedtime. Gummies  . metoprolol tartrate (LOPRESSOR) 100 MG tablet TAKE 1 TABLET BY MOUTH TWICE A DAY  . mupirocin cream  (BACTROBAN) 2 % APPLY TO AFFECTED AREA TWICE A DAY  . Naloxone HCl 0.4 MG/0.4ML SOAJ Inject as needed for suspected overdose.  [name] NOVOTWIST PEN NEEDLE 32G X 5 MM MISC USE 6 TIMES A DAY DX E11.9  . Omega-3 Fatty Acids (FISH OIL PO) Take 1 capsule by mouth in the morning and at bedtime.  . Omega-3 Fatty Acids (FISH OIL) 1200 MG CAPS Take 1,200 mg by mouth 2 (two) times daily.   Glory Rosebush ULTRA test strip USE WITH METER TWICE DAILY  . oxyCODONE (OXYCONTIN) 40 mg 12 hr tablet Take 1 tablet (40 mg total) by mouth every 8 (eight) hours as needed.  . pregabalin (LYRICA) 200 MG capsule TAKE 1 CAPSULE 3 TIMES A   DAY  . rOPINIRole (REQUIP) 1 MG tablet TAKE 1-2 TABLETS BY MOUTH AT BEDTIME AS NEEDED  . rOPINIRole (REQUIP) 2 MG tablet TAKE 1 TABLET BY MOUTH TWICE A DAY AS NEEDED  . sucralfate (CARAFATE) 1 g tablet TAKE 1 TABLET BY MOUTH 4 (FOUR) TIMES DAILY - WITH MEALS AND AT BEDTIME.  . tamsulosin (FLOMAX) 0.4 MG CAPS capsule Take 0.4 mg by mouth daily.  [name] testosterone cypionate (DEPOTESTOSTERONE CYPIONATE) 200 MG/ML injection Inject 200 mg into the muscle every 21 ( twenty-one) days.    No facility-administered medications prior to visit.    Review of Systems  Constitutional: Negative for appetite change, chills and fever.  Respiratory: Negative for chest tightness, shortness of breath and wheezing.   Cardiovascular: Negative for chest pain and palpitations.  Gastrointestinal: Negative for abdominal pain, nausea and vomiting.        Objective    There were no vitals taken for this visit. BP Readings from Last 3 Encounters:  05/04/21 114/69  03/13/21 140/84  12/08/20 (!) 137/59   Wt Readings from Last 3 Encounters:  05/04/21 266 lb (120.7 kg)  03/13/21 264 lb (119.7 kg)  12/08/20 269 lb (122 kg)       Physical Exam Vitals reviewed.  Constitutional:      Appearance: He is well-developed. He is obese.  HENT:     Head: Normocephalic and atraumatic.     Right Ear: External ear normal.      Left Ear: External ear normal.     Nose: Nose normal.  Eyes:     General: No scleral icterus.    Conjunctiva/sclera: Conjunctivae normal.  Neck:     Thyroid: No thyromegaly.  Cardiovascular:     Rate and Rhythm: Normal rate and regular rhythm.     Heart sounds: Normal heart sounds.  Pulmonary:     Effort: Pulmonary effort is normal.     Breath sounds: Normal breath sounds.  Abdominal:  Palpations: Abdomen is soft.  Musculoskeletal:     Right lower leg: Edema present.     Left lower leg: Edema present.     Comments: 1+ LE edema.    Skin:    General: Skin is warm and dry.  Neurological:     Mental Status: He is alert and oriented to person, place, and time. Mental status is at baseline.     Comments: Decreased sensation to monofilament in bilateral anterior feet. No nystagmus.  Gait is normal.  Psychiatric:        Mood and Affect: Mood normal.        Behavior: Behavior normal.        Thought Content: Thought content normal.        Judgment: Judgment normal.       Results for orders placed or performed in visit on 05/04/21  POCT glycosylated hemoglobin (Hb A1C)  Result Value Ref Range   Hemoglobin A1C 7.6 (A) 4.0 - 5.6 %   HbA1c POC (<> result, manual entry)     HbA1c, POC (prediabetic range)     HbA1c, POC (controlled diabetic range)      Assessment & Plan     1. Diabetes mellitus type 2, uncontrolled, with complications (HCC) Goal A1c less than 7-7.5.  Is on 5 insulin shots a day so will prescribe continuous glucose monitor. - POCT glycosylated hemoglobin (Hb A1C)  2. Dizziness And a cardiology referral.  This appears to be vertigo. - EKG 12-Lead  3. Cough Allergic rhinitis. - DG Chest 2 View; Future - predniSONE (DELTASONE) 5 MG tablet; Take 1 tablet (5 mg total) by mouth daily with breakfast for 5 days.  Dispense: 5 tablet; Refill: 0  4. Chronic diastolic CHF (congestive heart failure) (HCC) Clinically stable  5. OSA on CPAP CPAP nightly.  6.  Neuropathy Moderate discomfort for patient  7. Primary osteoarthritis of both knees Chronic pain  8. Class 2 severe obesity due to excess calories with serious comorbidity and body mass index (BMI) of 38.0 to 38.9 in adult [facility]) Diabetes and osteoarthritis .  She will work on diet and exercise.   No follow-ups on file.      I, [name], MD, have reviewed all documentation for this visit. The documentation on 05/08/21 for the exam, diagnosis, procedures, and orders are all accurate and complete.    Maelie Chriswell Cranford Mon, MD  [facility] [phone] (phone) [phone] (fax)  [place]

## 2021-05-02 ENCOUNTER — Other Ambulatory Visit: Payer: Self-pay | Admitting: Family Medicine

## 2021-05-02 DIAGNOSIS — IMO0002 Reserved for concepts with insufficient information to code with codable children: Secondary | ICD-10-CM

## 2021-05-02 DIAGNOSIS — R110 Nausea: Secondary | ICD-10-CM

## 2021-05-02 NOTE — Telephone Encounter (Signed)
Requested Prescriptions  Pending Prescriptions Disp Refills  . metoprolol tartrate (LOPRESSOR) 100 MG tablet [Pharmacy Med Name: METOPROLOL TARTRATE 100 MG TAB] 60 tablet 0    Sig: TAKE 1 TABLET BY MOUTH TWICE A DAY     Cardiovascular:  Beta Blockers Failed - 05/02/2021 12:32 PM      Failed - Last BP in normal range    BP Readings from Last 1 Encounters:  03/13/21 140/84         Passed - Last Heart Rate in normal range    Pulse Readings from Last 1 Encounters:  03/13/21 72         Passed - Valid encounter within last 6 months    Recent Outpatient Visits          2 months ago Mixed simple and mucopurulent chronic bronchitis ([place])   [facility] Jerrol Banana., MD   4 months ago Diabetes mellitus type 2, uncontrolled, with complications [facility])   [facility] Jerrol Banana., MD   5 months ago Chronic bilateral low back pain with bilateral sciatica   [facility] Jerrol Banana., MD   6 months ago Pressure injury of skin of sacral region, unspecified injury stage   [facility] Jerrol Banana., MD   7 months ago Encounter for [employer] annual wellness exam   [facility] Jerrol Banana., MD      Future Appointments            In 2 days Jerrol Banana., MD [facility]           . hydrochlorothiazide (HYDRODIURIL) 12.5 MG tablet [Pharmacy Med Name: HYDROCHLOROTHIAZIDE 12.5 MG TB] 60 tablet 0    Sig: TAKE 2 TABLETS (25 MG TOTAL) BY MOUTH AT BEDTIME.     Cardiovascular: Diuretics - Thiazide Failed - 05/02/2021 12:32 PM      Failed - Ca in normal range and within 360 days    Calcium  Date Value Ref Range Status  09/09/2020 8.3 (L) 8.9 - 10.3 mg/dL Final   Calcium, Total  Date Value Ref Range Status  04/12/2013 8.8 8.5 - 10.1 mg/dL Final   Calcium, Ion  Date Value Ref Range Status  05/29/2020 6.0 (H) 4.5 - 5.6 mg/dL Final         Failed -  Last BP in normal range    BP Readings from Last 1 Encounters:  03/13/21 140/84         Passed - Cr in normal range and within 360 days    Creat  Date Value Ref Range Status  12/13/2017 1.39 (H) 0.70 - 1.25 mg/dL Final    Comment:    For patients >24 years of age, the reference limit for Creatinine is approximately 13% higher for people identified as African-American. .    Creatinine, Ser  Date Value Ref Range Status  09/09/2020 1.18 0.61 - 1.24 mg/dL Final   Creatinine, Urine  Date Value Ref Range Status  06/24/2020 73 mg/dL Final         Passed - K in normal range and within 360 days    Potassium  Date Value Ref Range Status  09/09/2020 3.8 3.5 - 5.1 mmol/L Final  04/12/2013 4.0 3.5 - 5.1 mmol/L Final         Passed - Na in normal range and within 360 days    Sodium  Date Value Ref Range Status  09/09/2020 139 135 - 145 mmol/L Final  07/02/2020 143 134 - 144 mmol/L Final  04/12/2013 137 136 - 145 mmol/L Final         Passed - Valid encounter within last 6 months    Recent Outpatient Visits          2 months ago Mixed simple and mucopurulent chronic bronchitis ([place])   [facility] Jerrol Banana., MD   4 months ago Diabetes mellitus type 2, uncontrolled, with complications [facility])   [facility] Jerrol Banana., MD   5 months ago Chronic bilateral low back pain with bilateral sciatica   [facility] Jerrol Banana., MD   6 months ago Pressure injury of skin of sacral region, unspecified injury stage   [facility] Jerrol Banana., MD   7 months ago Encounter for [employer] annual wellness exam   [facility] Jerrol Banana., MD      Future Appointments            In 2 days Jerrol Banana., MD [facility] [place]           . FARXIGA 10 MG TABS tablet [Pharmacy Med Name: FARXIGA 10 MG TABLET] 30 tablet 0    Sig: TAKE 1 TABLET BY MOUTH  EVERY DAY     Endocrinology:  Diabetes - SGLT2 Inhibitors Failed - 05/02/2021 12:32 PM      Failed - HBA1C is between 0 and 7.9 and within 180 days    Hemoglobin A1C  Date Value Ref Range Status  12/08/2020 8.7 (A) 4.0 - 5.6 % Final   Hgb A1c MFr Bld  Date Value Ref Range Status  07/25/2020 7.6 (H) 4.8 - 5.6 % Final    Comment:    (NOTE)         Prediabetes: 5.7 - 6.4         Diabetes: >6.4         Glycemic control for adults with diabetes: <7.0          Passed - Cr in normal range and within 360 days    Creat  Date Value Ref Range Status  12/13/2017 1.39 (H) 0.70 - 1.25 mg/dL Final    Comment:    For patients >38 years of age, the reference limit for Creatinine is approximately 13% higher for people identified as African-American. .    Creatinine, Ser  Date Value Ref Range Status  09/09/2020 1.18 0.61 - 1.24 mg/dL Final   Creatinine, Urine  Date Value Ref Range Status  06/24/2020 73 mg/dL Final         Passed - LDL in normal range and within 360 days    Ldl Cholesterol, Calc  Date Value Ref Range Status  04/03/2013 35 0 - 100 mg/dL Final   LDL Chol Calc (NIH)  Date Value Ref Range Status  05/15/2020 67 0 - 99 mg/dL Final         Passed - eGFR in normal range and within 360 days    GFR, Est African American  Date Value Ref Range Status  12/13/2017 60 > OR = 60 mL/min/1.25m Final   GFR calc Af Amer  Date Value Ref Range Status  09/09/2020 >60 >60 mL/min Final   GFR, Est Non African American  Date Value Ref Range Status  12/13/2017 52 (L) > OR = 60 mL/min/1.735mFinal   GFR calc non Af AmWyvonnia LoraDate  Value Ref Range Status  09/09/2020 >60 >60 mL/min Final         Passed - Valid encounter within last 6 months    Recent Outpatient Visits          2 months ago Mixed simple and mucopurulent chronic bronchitis ([place])   [facility] Jerrol Banana., MD   4 months ago Diabetes mellitus type 2, uncontrolled, with complications [facility])    [facility] Jerrol Banana., MD   5 months ago Chronic bilateral low back pain with bilateral sciatica   [facility] Jerrol Banana., MD   6 months ago Pressure injury of skin of sacral region, unspecified injury stage   [facility] Jerrol [facility]   7 months ago Encounter for [employer] annual wellness exam   [facility] Jerrol Banana., MD      Future Appointments            In 2 days Jerrol Banana., MD [facility]           . NOVOTWIST PEN NEEDLE 32G X 5 MM MISC [Pharmacy Med Name: NOVOTWIST NEEDLE 32G 5MM] 100 each 1    Sig: USE 6 TIMES A DAY DX E11.9     Endocrinology: Diabetes - Testing Supplies Passed - 05/02/2021 12:32 PM      Passed - Valid encounter within last 12 months    Recent Outpatient Visits          2 months ago Mixed simple and mucopurulent chronic bronchitis ([place])   [facility] Jerrol Ban[facility]., MD   4 months ago Diabetes mellitus type 2, uncontrolled, with complications [facility])   [facility] Jerrol Banana., MD   5 months ago Chronic bilateral low back pain with bilateral sciatica   [facility] Jerrol Banana., MD   6 months ago Pressure injury of skin of sacral region, unspecified injury stage   [facility] Jerrol Banana., MD   7 months ago Encounter for [employer] annual wellness exam   [facility] Jerrol Banana., MD      Future Appointments            In 2 days Jerrol Banana., MD [facility]           . sucralfate (CARAFATE) 1 g tablet [Pharmacy Med Name: SUCRALFATE 1 GM TABLET] 120 tablet 0    Sig: TAKE 1 TABLET BY MOUTH 4 (FOUR) TIMES DAILY - WITH MEALS AND AT BEDTIME.     Gastroenterology: Antiacids Passed - 05/02/2021 12:32 PM      Passed - Valid encounter within last 12 months    Recent  Outpatient Visits          2 months ago Mixed simple and mucopurulent chronic bronchitis [facility])   [facility] Jerrol Banana., MD   4 months ago Diabetes mellitus type 2, uncontrolled, with complications [facility])   [facility] Jer[facility] MD   5 months ago Chronic bilateral low back pain with bilateral sciatica   [facility] Jerrol Banana., MD   6 months ago Pressure injury of skin of sacral region, unspecified injury stage   [facility] Jerrol Banana., MD   7 months ago Encounter for [employer] annual wellness exam   [facility]  Jerrol Banana., MD      Future Appointments            In 2 days Jerrol Banana., MD [facility]           . rOPINIRole (REQUIP) 1 MG tablet [Pharmacy Med Name: ROPINIROLE HCL 1 MG TABLET] 60 tablet 0    Sig: TAKE 1-2 TABLETS BY MOUTH AT BEDTIME AS NEEDED     Neurology:  Parkinsonian Agents Failed - 05/02/2021 12:32 PM      Failed - Last BP in normal range    BP Readings from Last 1 Encounters:  03/13/21 140/84         Passed - Valid encounter within last 12 months    Recent Outpatient Visits          2 months ago Mixed simple and mucopurulent chronic bronchitis ([place])   [facility] Jerrol Banana., MD   4 months ago Diabetes mellitus type 2, uncontrolled, with complications [facility])   [facility] Jerrol Banana., MD   5 months ago Chronic bilateral low back pain with bilateral sciatica   [facility] Jerrol Banana., MD   6 months ago Pressure injury of skin of sacral region, unspecified injury stage   [facility] Jerrol Banana., MD   7 months ago Encounter for [employer] annual wellness exam   [facility] Jerrol Banana., MD      Future Appointments            In 2 days Jerrol Banana., MD  [facility] [place]

## 2021-05-02 NOTE — Telephone Encounter (Signed)
Requested Prescriptions  Pending Prescriptions Disp Refills  . metoprolol tartrate (LOPRESSOR) 100 MG tablet [Pharmacy Med Name: METOPROLOL TARTRATE 100 MG TAB] 60 tablet 0    Sig: TAKE 1 TABLET BY MOUTH TWICE A DAY     Cardiovascular:  Beta Blockers Failed - 05/02/2021 12:32 PM      Failed - Last BP in normal range    BP Readings from Last 1 Encounters:  03/13/21 140/84         Passed - Last Heart Rate in normal range    Pulse Readings from Last 1 Encounters:  03/13/21 72         Passed - Valid encounter within last 6 months    Recent Outpatient Visits          2 months ago Mixed simple and mucopurulent chronic bronchitis ([place])   [facility] Jerrol Banana., MD   4 months ago Diabetes mellitus type 2, uncontrolled, with complications [facility])   [facility] Jerrol Ban[facility]., MD   5 months ago Chronic bilateral low back pain with bilateral sciatica   [facility]   6 months ago Pressure injury of skin of sacral region, unspecified injury stage   [facility] Jer[facility] Ban[facility] MD   7 months ago Encounter for [employer] annual wellness exam   [facility] Jer[facility] MD      Future Appointments            In 2 days Jerrol Banana., MD [facility]           . hydrochlorothiazide (HYDRODIURIL) 12.5 MG tablet [Pharmacy Med Name: HYDROCHLOROTHIAZIDE 12.5 MG TB] 180 tablet 0    Sig: TAKE 2 TABLETS (25 MG TOTAL) BY MOUTH AT BEDTIME.     Cardiovascular: Diuretics - Thiazide Failed - 05/02/2021 12:32 PM      Failed - Ca in normal range and within 360 days    Calcium  Date Value Ref Range Status  09/09/2020 8.3 (L) 8.9 - 10.3 mg/dL Final   Calcium, Total  Date Value Ref Range Status  04/12/2013 8.8 8.5 - 10.1 mg/dL Final   Calcium, Ion  Date Value Ref Range Status  05/29/2020 6.0 (H) 4.5 - 5.6 mg/dL Final         Failed -  Last BP in normal range    BP Readings from Last 1 Encounters:  03/13/21 140/84         Passed - Cr in normal range and within 360 days    Creat  Date Value Ref Range Status  12/13/2017 1.39 (H) 0.70 - 1.25 mg/dL Final    Comment:    For patients >29 years of age, the reference limit for Creatinine is approximately 13% higher for people identified as African-American. .    Creatinine, Ser  Date Value Ref Range Status  09/09/2020 1.18 0.61 - 1.24 mg/dL Final   Creatinine, Urine  Date Value Ref Range Status  06/24/2020 73 mg/dL Final         Passed - K in normal range and within 360 days    Potassium  Date Value Ref Range Status  09/09/2020 3.8 3.5 - 5.1 mmol/L Final  04/12/2013 4.0 3.5 - 5.1 mmol/L Final         Passed - Na in normal range and within 360 days    Sodium  Date Value Ref Range Status  09/09/2020 139 135 - 145 mmol/L Final  07/02/2020 143 134 - 144 mmol/L Final  04/12/2013 137 136 - 145 mmol/L Final         Passed - Valid encounter within last 6 months    Recent Outpatient Visits          2 months ago Mixed simple and mucopurulent chronic bronchitis ([place])   [facility]   4 months ago Diabetes mellitus type 2, uncontrolled, with complications [facility])   [facility] Jer[facility] MD   5 months ago Chronic bilateral low back pain with bilateral sciatica   [facility]   6 months ago Pressure injury of skin of sacral region, unspecified injury stage   [facility] Jerrol Banana., MD   7 months ago Encounter for [employer] annual wellness exam   [facility] Jer[facility] Ban[facility]., MD      Future Appointments            In 2 days Jerrol Banana., MD [facility] [place]           . FARXIGA 10 MG TABS tablet [Pharmacy Med Name: FARXIGA 10 MG TABLET] 30 tablet 2    Sig: TAKE 1 TABLET BY MOUTH  EVERY DAY     Endocrinology:  Diabetes - SGLT2 Inhibitors Failed - 05/02/2021 12:32 PM      Failed - HBA1C is between 0 and 7.9 and within 180 days    Hemoglobin A1C  Date Value Ref Range Status  12/08/2020 8.7 (A) 4.0 - 5.6 % Final   Hgb A1c MFr Bld  Date Value Ref Range Status  07/25/2020 7.6 (H) 4.8 - 5.6 % Final    Comment:    (NOTE)         Prediabetes: 5.7 - 6.4         Diabetes: >6.4         Glycemic control for adults with diabetes: <7.0          Passed - Cr in normal range and within 360 days    Creat  Date Value Ref Range Status  12/13/2017 1.39 (H) 0.70 - 1.25 mg/dL Final    Comment:    For patients >36 years of age, the reference limit for Creatinine is approximately 13% higher for people identified as African-American. .    Creatinine, Ser  Date Value Ref Range Status  09/09/2020 1.18 0.61 - 1.24 mg/dL Final   Creatinine, Urine  Date Value Ref Range Status  06/24/2020 73 mg/dL Final         Passed - LDL in normal range and within 360 days    Ldl Cholesterol, Calc  Date Value Ref Range Status  04/03/2013 35 0 - 100 mg/dL Final   LDL Chol Calc (NIH)  Date Value Ref Range Status  05/15/2020 67 0 - 99 mg/dL Final         Passed - eGFR in normal range and within 360 days    GFR, Est African American  Date Value Ref Range Status  12/13/2017 60 > OR = 60 mL/min/1.33m Final   GFR calc Af Amer  Date Value Ref Range Status  09/09/2020 >60 >60 mL/min Final   GFR, Est Non African American  Date Value Ref Range Status  12/13/2017 52 (L) > OR = 60 mL/min/1.741mFinal   GFR calc non Af AmWyvonnia LoraDate  Value Ref Range Status  09/09/2020 >60 >60 mL/min Final         Passed - Valid encounter within last 6 months    Recent Outpatient Visits          2 months ago Mixed simple and mucopurulent chronic bronchitis ([place])   [facility] MD   4 months ago Diabetes mellitus type 2, uncontrolled, with complications [facility])    [facility]   5 months ago Chronic bilateral low back pain with bilateral sciatica   [facility] Jer[facility] MD   6 months ago Pressure injury of skin of sacral region, unspecified injury stage   [facility] Jerrol Banana., MD   7 months ago Encounter for [employer] annual wellness exam   [facility] Jerrol Banana., MD      Future Appointments            In 2 days Jerrol Banana., MD [facility] [facility]           . NOVOTWIST PEN NEEDLE 32G X 5 MM MISC [Pharmacy Med Name: NOVOTWIST NEEDLE 32G 5MM] 100 each 1    Sig: USE 6 TIMES A DAY DX E11.9     Endocrinology: Diabetes - Testing Supplies Passed - 05/02/2021 12:32 PM      Passed - Valid encounter within last 12 months    Recent Outpatient Visits          2 months ago Mixed simple and mucopurulent chronic bronchitis ([place])   [facility] Jer[facility] MD   4 months ago Diabetes mellitus type 2, uncontrolled, with complications [facility])   [facility] Jerrol Ban[facility]., MD   5 months ago Chronic bilateral low back pain with bilateral sciatica   [facility] Jer[facility]., MD   6 months ago Pressure injury of skin of sacral region, unspecified injury stage   [facility] Jer[facility]., MD   7 months ago Encounter for [employer] annual wellness exam   [facility] Jer[facility]., MD      Future Appointments            In 2 days Jerrol Banana., MD [facility]           . sucralfate (CARAFATE) 1 g tablet [Pharmacy Med Name: SUCRALFATE 1 GM TABLET] 360 tablet 1    Sig: TAKE 1 TABLET BY MOUTH 4 (FOUR) TIMES DAILY - WITH MEALS AND AT BEDTIME.     Gastroenterology: Antiacids Passed - 05/02/2021 12:32 PM      Passed - Valid encounter within last 12 months    Recent  Outpatient Visits          2 months ago Mixed simple and mucopurulent chronic bronchitis [facility])   [facility]   4 months ago Diabetes mellitus type 2, uncontrolled, with complications [facility])   [facility] Jerrol Banana., MD   5 months ago Chronic bilateral low back pain with bilateral sciatica   [facility] Jer[facility] Ban[facility]., MD   6 months ago Pressure injury of skin of sacral region, unspecified injury stage   [facility] Jerrol Banana., MD   7 months ago Encounter for [employer] annual wellness exam   [facility]  Jerrol Banana., MD      Future Appointments            In 2 days Jerrol Banana., MD [facility]           . rOPINIRole (REQUIP) 1 MG tablet [Pharmacy Med Name: ROPINIROLE HCL 1 MG TABLET] 180 tablet 0    Sig: TAKE 1-2 TABLETS BY MOUTH AT BEDTIME AS NEEDED     Neurology:  Parkinsonian Agents Failed - 05/02/2021 12:32 PM      Failed - Last BP in normal range    BP Readings from Last 1 Encounters:  03/13/21 140/84         Passed - Valid encounter within last 12 months    Recent Outpatient Visits          2 months ago Mixed simple and mucopurulent chronic bronchitis ([place])   [facility]   4 months ago Diabetes mellitus type 2, uncontrolled, with complications [facility])   [facility] Jer[facility] Ban[facility] MD   5 months ago Chronic bilateral low back pain with bilateral sciatica   [facility] Jer[facility] Banana., MD   6 months ago Pressure injury of skin of sacral region, unspecified injury stage   [facility] Jer[facility]., MD   7 months ago Encounter for [employer] annual wellness exam   [facility] Jer[facility] Banana., MD      Future Appointments            In 2 days Jerrol Banana., MD  [facility] [place]

## 2021-05-04 ENCOUNTER — Other Ambulatory Visit: Payer: Self-pay

## 2021-05-04 ENCOUNTER — Ambulatory Visit (INDEPENDENT_AMBULATORY_CARE_PROVIDER_SITE_OTHER): Payer: Medicare Other | Admitting: Family Medicine

## 2021-05-04 ENCOUNTER — Encounter: Payer: Self-pay | Admitting: Family Medicine

## 2021-05-04 VITALS — BP 114/69 | HR 64 | Temp 98.0°F | Resp 16 | Ht 71.0 in | Wt 266.0 lb

## 2021-05-04 DIAGNOSIS — G4733 Obstructive sleep apnea (adult) (pediatric): Secondary | ICD-10-CM | POA: Diagnosis not present

## 2021-05-04 DIAGNOSIS — M170 Bilateral primary osteoarthritis of knee: Secondary | ICD-10-CM

## 2021-05-04 DIAGNOSIS — E118 Type 2 diabetes mellitus with unspecified complications: Secondary | ICD-10-CM

## 2021-05-04 DIAGNOSIS — G629 Polyneuropathy, unspecified: Secondary | ICD-10-CM | POA: Diagnosis not present

## 2021-05-04 DIAGNOSIS — I5032 Chronic diastolic (congestive) heart failure: Secondary | ICD-10-CM

## 2021-05-04 DIAGNOSIS — E1165 Type 2 diabetes mellitus with hyperglycemia: Secondary | ICD-10-CM

## 2021-05-04 DIAGNOSIS — Z9989 Dependence on other enabling machines and devices: Secondary | ICD-10-CM

## 2021-05-04 DIAGNOSIS — R42 Dizziness and giddiness: Secondary | ICD-10-CM

## 2021-05-04 DIAGNOSIS — Z6838 Body mass index (BMI) 38.0-38.9, adult: Secondary | ICD-10-CM

## 2021-05-04 DIAGNOSIS — IMO0002 Reserved for concepts with insufficient information to code with codable children: Secondary | ICD-10-CM

## 2021-05-04 DIAGNOSIS — R059 Cough, unspecified: Secondary | ICD-10-CM | POA: Diagnosis not present

## 2021-05-04 LAB — POCT GLYCOSYLATED HEMOGLOBIN (HGB A1C): Hemoglobin A1C: 7.6 % — AB (ref 4.0–5.6)

## 2021-05-04 MED ORDER — PREDNISONE 5 MG PO TABS: 5.0000 mg | ORAL_TABLET | Freq: Every day | ORAL | 0 refills | Status: AC

## 2021-05-05 ENCOUNTER — Other Ambulatory Visit: Payer: Self-pay

## 2021-05-05 ENCOUNTER — Ambulatory Visit
Admission: RE | Admit: 2021-05-05 | Discharge: 2021-05-05 | Disposition: A | Discharge status: Home / Self Care | Payer: Medicare Other | Attending: Family Medicine | Admitting: Family Medicine

## 2021-05-05 ENCOUNTER — Ambulatory Visit
Admission: RE | Admit: 2021-05-05 | Discharge: 2021-05-05 | Disposition: A | Discharge status: Home / Self Care | Payer: Medicare Other | Source: Ambulatory Visit | Attending: Family Medicine | Admitting: Family Medicine

## 2021-05-05 DIAGNOSIS — R059 Cough, unspecified: Secondary | ICD-10-CM | POA: Diagnosis not present

## 2021-05-06 DIAGNOSIS — E291 Testicular hypofunction: Secondary | ICD-10-CM | POA: Diagnosis not present

## 2021-05-11 ENCOUNTER — Other Ambulatory Visit: Payer: Self-pay | Admitting: Family Medicine

## 2021-05-11 DIAGNOSIS — IMO0002 Reserved for concepts with insufficient information to code with codable children: Secondary | ICD-10-CM

## 2021-05-11 DIAGNOSIS — E1165 Type 2 diabetes mellitus with hyperglycemia: Secondary | ICD-10-CM

## 2021-05-12 ENCOUNTER — Other Ambulatory Visit: Payer: Self-pay | Admitting: Family Medicine

## 2021-05-12 DIAGNOSIS — IMO0002 Reserved for concepts with insufficient information to code with codable children: Secondary | ICD-10-CM

## 2021-05-12 DIAGNOSIS — E1165 Type 2 diabetes mellitus with hyperglycemia: Secondary | ICD-10-CM

## 2021-05-12 DIAGNOSIS — R110 Nausea: Secondary | ICD-10-CM

## 2021-05-14 ENCOUNTER — Other Ambulatory Visit: Payer: Self-pay

## 2021-05-14 DIAGNOSIS — IMO0002 Reserved for concepts with insufficient information to code with codable children: Secondary | ICD-10-CM

## 2021-05-14 DIAGNOSIS — E1165 Type 2 diabetes mellitus with hyperglycemia: Secondary | ICD-10-CM

## 2021-05-14 MED ORDER — VICTOZA 18 MG/3ML ~~LOC~~ SOPN: 1.8000 mg | PEN_INJECTOR | Freq: Every day | SUBCUTANEOUS | 3 refills | Status: DC

## 2021-05-14 MED ORDER — NOVOLOG FLEXPEN 100 UNIT/ML ~~LOC~~ SOPN: 14.0000 [IU] | PEN_INJECTOR | Freq: Two times a day (BID) | SUBCUTANEOUS | 11 refills | Status: DC

## 2021-05-14 NOTE — Telephone Encounter (Signed)
Request for medication refill

## 2021-05-28 DIAGNOSIS — M65332 Trigger finger, left middle finger: Secondary | ICD-10-CM | POA: Insufficient documentation

## 2021-06-03 ENCOUNTER — Other Ambulatory Visit: Payer: Self-pay | Admitting: Family Medicine

## 2021-06-03 DIAGNOSIS — E291 Testicular hypofunction: Secondary | ICD-10-CM | POA: Diagnosis not present

## 2021-06-03 DIAGNOSIS — IMO0002 Reserved for concepts with insufficient information to code with codable children: Secondary | ICD-10-CM

## 2021-06-05 ENCOUNTER — Other Ambulatory Visit: Payer: Self-pay | Admitting: Family Medicine

## 2021-06-07 ENCOUNTER — Other Ambulatory Visit: Payer: Self-pay | Admitting: Family Medicine

## 2021-06-07 DIAGNOSIS — R110 Nausea: Secondary | ICD-10-CM

## 2021-06-07 NOTE — Telephone Encounter (Signed)
Requested Prescriptions  Pending Prescriptions Disp Refills  . sucralfate (CARAFATE) 1 g tablet [Pharmacy Med Name: SUCRALFATE 1 GM TABLET] 120 tablet 0    Sig: TAKE 1 TABLET BY MOUTH 4 (FOUR) TIMES DAILY - WITH MEALS AND AT BEDTIME.     Gastroenterology: Antiacids Passed - 06/07/2021 12:30 PM      Passed - Valid encounter within last 12 months    Recent Outpatient Visits          1 month ago Diabetes mellitus type 2, uncontrolled, with complications [facility])   [facility] MD   3 months ago Mixed simple and mucopurulent chronic bronchitis [facility])   [facility] Jerrol Banana., MD   6 months ago Diabetes mellitus type 2, uncontrolled, with complications [facility])   [facility]   7 months ago Chronic bilateral low back pain with bilateral sciatica   [facility]   8 months ago Pressure injury of skin of sacral region, unspecified injury stage   [facility] Jer[facility] Banana., MD      Future Appointments            In 3 months Jerrol Banana., MD [facility]           . metoprolol tartrate (LOPRESSOR) 100 MG tablet [Pharmacy Med Name: METOPROLOL TARTRATE 100 MG TAB] 60 tablet     Sig: TAKE 1 TABLET BY MOUTH TWICE A DAY     Cardiovascular:  Beta Blockers Passed - 06/07/2021 12:30 PM      Passed - Last BP in normal range    BP Readings from Last 1 Encounters:  05/04/21 114/69         Passed - Last Heart Rate in normal range    Pulse Readings from Last 1 Encounters:  05/04/21 64         Passed - Valid encounter within last 6 months    Recent Outpatient Visits          1 month ago Diabetes mellitus type 2, uncontrolled, with complications [facility])   [facility] Jerrol Banana., MD   3 months ago Mixed simple and mucopurulent chronic bronchitis [facility])   [facility]  Jerrol Banana., MD   6 months ago Diabetes mellitus type 2, uncontrolled, with complications [facility])   [facility] Jerrol Banana., MD   7 months ago Chronic bilateral low back pain with bilateral sciatica   [facility] Jerrol Banana., MD   8 months ago Pressure injury of skin of sacral region, unspecified injury stage   [facility] Jerrol Banana., MD      Future Appointments            In 3 months Jerrol Banana., MD [facility]           . rOPINIRole (REQUIP) 1 MG tablet [Pharmacy Med Name: ROPINIROLE HCL 1 MG TABLET] 60 tablet 0    Sig: TAKE 1-2 TABLETS BY MOUTH AT BEDTIME AS NEEDED     Neurology:  Parkinsonian Agents Passed - 06/07/2021 12:30 PM      Passed - Last BP in normal range    BP Readings from Last 1 Encounters:  05/04/21 114/69         Passed - Valid encounter within last 12 months  Recent Outpatient Visits          1 month ago Diabetes mellitus type 2, uncontrolled, with complications [facility])   [facility] Jerrol Banana., MD   3 months ago Mixed simple and mucopurulent chronic bronchitis [facility])   [facility] Jerrol Banana., MD   6 months ago Diabetes mellitus type 2, uncontrolled, with complications [facility])   [facility] Jerrol Banana., MD   7 months ago Chronic bilateral low back pain with bilateral sciatica   [facility] Jer[facility] Ban[facility] MD   8 months ago Pressure injury of skin of sacral region, unspecified injury stage   [facility] Jerrol Banana., MD      Future Appointments            In 3 months Jerrol Banana., MD [facility]           . hydrochlorothiazide (HYDRODIURIL) 12.5 MG tablet [Pharmacy Med Name: HYDROCHLOROTHIAZIDE 12.5 MG TB] 60 tablet 0    Sig: TAKE 2 TABLETS (25 MG TOTAL) BY MOUTH AT BEDTIME.      Cardiovascular: Diuretics - Thiazide Failed - 06/07/2021 12:30 PM      Failed - Ca in normal range and within 360 days    Calcium  Date Value Ref Range Status  09/09/2020 8.3 (L) 8.9 - 10.3 mg/dL Final   Calcium, Total  Date Value Ref Range Status  04/12/2013 8.8 8.5 - 10.1 mg/dL Final   Calcium, Ion  Date Value Ref Range Status  05/29/2020 6.0 (H) 4.5 - 5.6 mg/dL Final         Passed - Cr in normal range and within 360 days    Creat  Date Value Ref Range Status  12/13/2017 1.39 (H) 0.70 - 1.25 mg/dL Final    Comment:    For patients >76 years of age, the reference limit for Creatinine is approximately 13% higher for people identified as African-American. .    Creatinine, Ser  Date Value Ref Range Status  09/09/2020 1.18 0.61 - 1.24 mg/dL Final   Creatinine, Urine  Date Value Ref Range Status  06/24/2020 73 mg/dL Final         Passed - K in normal range and within 360 days    Potassium  Date Value Ref Range Status  09/09/2020 3.8 3.5 - 5.1 mmol/L Final  04/12/2013 4.0 3.5 - 5.1 mmol/L Final         Passed - Na in normal range and within 360 days    Sodium  Date Value Ref Range Status  09/09/2020 139 135 - 145 mmol/L Final  07/02/2020 143 134 - 144 mmol/L Final  04/12/2013 137 136 - 145 mmol/L Final         Passed - Last BP in normal range    BP Readings from Last 1 Encounters:  05/04/21 114/69         Passed - Valid encounter within last 6 months    Recent Outpatient Visits          1 month ago Diabetes mellitus type 2, uncontrolled, with complications [facility])   [facility] Jerrol Banana., MD   3 months ago Mixed simple and mucopurulent chronic bronchitis [facility])   [facility] Jerrol Banana., MD   6 months ago Diabetes mellitus type 2, uncontrolled, with complications [facility])   [facility] Jerrol Banana., MD  7 months ago Chronic bilateral low back pain with bilateral sciatica    [facility] Jer[facility] Banana., MD   8 months ago Pressure injury of skin of sacral region, unspecified injury stage   [facility] Jerrol Banana., MD      Future Appointments            In 3 months Jerrol Banana., MD [facility] [place]

## 2021-06-07 NOTE — Telephone Encounter (Signed)
Requested medication (s) are due for refill today: yes  Requested medication (s) are on the active medication list: yes  Last refill:  05/02/21  Future visit scheduled: yes  Notes to clinic:  30 day courtesy refill already given    Requested Prescriptions  Pending Prescriptions Disp Refills   metoprolol tartrate (LOPRESSOR) 100 MG tablet [Pharmacy Med Name: METOPROLOL TARTRATE 100 MG TAB] 60 tablet     Sig: TAKE 1 TABLET BY MOUTH TWICE A DAY      Cardiovascular:  Beta Blockers Passed - 06/07/2021 12:30 PM      Passed - Last BP in normal range    BP Readings from Last 1 Encounters:  05/04/21 114/69          Passed - Last Heart Rate in normal range    Pulse Readings from Last 1 Encounters:  05/04/21 64          Passed - Valid encounter within last 6 months    Recent Outpatient Visits           1 month ago Diabetes mellitus type 2, uncontrolled, with complications [facility])   [facility] Jer[facility]   3 months ago Mixed simple and mucopurulent chronic bronchitis [facility])   [facility] Jer[facility] Banana., MD   6 months ago Diabetes mellitus type 2, uncontrolled, with complications [facility])   [facility] Jer[facility] Ban[facility] MD   7 months ago Chronic bilateral low back pain with bilateral sciatica   [facility] Jerrol Banana., MD   8 months ago Pressure injury of skin of sacral region, unspecified injury stage   [facility] Jerrol Banana., MD       Future Appointments             In 3 months Jerrol Banana., MD [facility]              Signed Prescriptions Disp Refills   sucralfate (CARAFATE) 1 g tablet 120 tablet 0    Sig: TAKE 1 TABLET BY MOUTH 4 (FOUR) TIMES DAILY - WITH MEALS AND AT BEDTIME.      Gastroenterology: Antiacids Passed - 06/07/2021 12:30 PM      Passed - Valid encounter within last 12 months    Recent Outpatient  Visits           1 month ago Diabetes mellitus type 2, uncontrolled, with complications [facility])   [facility] Jer[facility]   3 months ago Mixed simple and mucopurulent chronic bronchitis [facility])   [facility] Jerrol Banana., MD   6 months ago Diabetes mellitus type 2, uncontrolled, with complications [facility])   [facility] Jerrol Banana., MD   7 months ago Chronic bilateral low back pain with bilateral sciatica   [facility]   8 months ago Pressure injury of skin of sacral region, unspecified injury stage   [facility] Jerrol Banana., MD       Future Appointments             In 3 months Jerrol Banana., MD [facility]               rOPINIRole (REQUIP) 1 MG tablet 60 tablet 0    Sig: TAKE 1-2 TABLETS BY MOUTH AT BEDTIME AS NEEDED  Neurology:  Parkinsonian Agents Passed - 06/07/2021 12:30 PM      Passed - Last BP in normal range    BP Readings from Last 1 Encounters:  05/04/21 114/69          Passed - Valid encounter within last 12 months    Recent Outpatient Visits           1 month ago Diabetes mellitus type 2, uncontrolled, with complications [facility])   [facility] Jer[facility] Banana., MD   3 months ago Mixed simple and mucopurulent chronic bronchitis [facility])   [facility] Jerrol Banana., MD   6 months ago Diabetes mellitus type 2, uncontrolled, with complications [facility])   [facility] Jerrol Banana., MD   7 months ago Chronic bilateral low back pain with bilateral sciatica   [facility] Jerrol Banana., MD   8 months ago Pressure injury of skin of sacral region, unspecified injury stage   [facility] Jerrol Banana., MD       Future Appointments             In 3 months Jerrol Banana., MD  [facility]               hydrochlorothiazide (HYDRODIURIL) 12.5 MG tablet 60 tablet 0    Sig: TAKE 2 TABLETS (25 MG TOTAL) BY MOUTH AT BEDTIME.      Cardiovascular: Diuretics - Thiazide Failed - 06/07/2021 12:30 PM      Failed - Ca in normal range and within 360 days    Calcium  Date Value Ref Range Status  09/09/2020 8.3 (L) 8.9 - 10.3 mg/dL Final   Calcium, Total  Date Value Ref Range Status  04/12/2013 8.8 8.5 - 10.1 mg/dL Final   Calcium, Ion  Date Value Ref Range Status  05/29/2020 6.0 (H) 4.5 - 5.6 mg/dL Final          Passed - Cr in normal range and within 360 days    Creat  Date Value Ref Range Status  12/13/2017 1.39 (H) 0.70 - 1.25 mg/dL Final    Comment:    For patients >52 years of age, the reference limit for Creatinine is approximately 13% higher for people identified as African-American. .    Creatinine, Ser  Date Value Ref Range Status  09/09/2020 1.18 0.61 - 1.24 mg/dL Final   Creatinine, Urine  Date Value Ref Range Status  06/24/2020 73 mg/dL Final          Passed - K in normal range and within 360 days    Potassium  Date Value Ref Range Status  09/09/2020 3.8 3.5 - 5.1 mmol/L Final  04/12/2013 4.0 3.5 - 5.1 mmol/L Final          Passed - Na in normal range and within 360 days    Sodium  Date Value Ref Range Status  09/09/2020 139 135 - 145 mmol/L Final  07/02/2020 143 134 - 144 mmol/L Final  04/12/2013 137 136 - 145 mmol/L Final          Passed - Last BP in normal range    BP Readings from Last 1 Encounters:  05/04/21 114/69          Passed - Valid encounter within last 6 months    Recent Outpatient Visits           1 month ago Diabetes mellitus type 2,  uncontrolled, with complications [facility])   [facility] Jerrol Banana., MD   3 months ago Mixed simple and mucopurulent chronic bronchitis [facility])   [facility] Jer[facility] MD   6 months ago Diabetes  mellitus type 2, uncontrolled, with complications [facility])   [facility] Jerrol Banana., MD   7 months ago Chronic bilateral low back pain with bilateral sciatica   [facility] Jerrol Banana., MD   8 months ago Pressure injury of skin of sacral region, unspecified injury stage   [facility] Jerrol Banana., MD       Future Appointments             In 3 months Jerrol Banana., MD [facility], [place]

## 2021-06-11 ENCOUNTER — Ambulatory Visit (INDEPENDENT_AMBULATORY_CARE_PROVIDER_SITE_OTHER): Payer: Medicare Other | Admitting: Family Medicine

## 2021-06-11 ENCOUNTER — Other Ambulatory Visit: Payer: Self-pay

## 2021-06-11 ENCOUNTER — Other Ambulatory Visit: Payer: Self-pay | Admitting: Family Medicine

## 2021-06-11 VITALS — BP 141/61 | HR 86 | Temp 98.2°F | Resp 16 | Wt 271.2 lb

## 2021-06-11 DIAGNOSIS — M5442 Lumbago with sciatica, left side: Secondary | ICD-10-CM | POA: Diagnosis not present

## 2021-06-11 DIAGNOSIS — E1165 Type 2 diabetes mellitus with hyperglycemia: Secondary | ICD-10-CM | POA: Diagnosis not present

## 2021-06-11 DIAGNOSIS — R0789 Other chest pain: Secondary | ICD-10-CM

## 2021-06-11 DIAGNOSIS — R0782 Intercostal pain: Secondary | ICD-10-CM

## 2021-06-11 DIAGNOSIS — M47816 Spondylosis without myelopathy or radiculopathy, lumbar region: Secondary | ICD-10-CM

## 2021-06-11 DIAGNOSIS — E118 Type 2 diabetes mellitus with unspecified complications: Secondary | ICD-10-CM | POA: Diagnosis not present

## 2021-06-11 DIAGNOSIS — I480 Paroxysmal atrial fibrillation: Secondary | ICD-10-CM

## 2021-06-11 DIAGNOSIS — E782 Mixed hyperlipidemia: Secondary | ICD-10-CM

## 2021-06-11 DIAGNOSIS — G629 Polyneuropathy, unspecified: Secondary | ICD-10-CM | POA: Diagnosis not present

## 2021-06-11 DIAGNOSIS — M5441 Lumbago with sciatica, right side: Secondary | ICD-10-CM

## 2021-06-11 DIAGNOSIS — M170 Bilateral primary osteoarthritis of knee: Secondary | ICD-10-CM | POA: Diagnosis not present

## 2021-06-11 DIAGNOSIS — G8929 Other chronic pain: Secondary | ICD-10-CM

## 2021-06-11 DIAGNOSIS — I5032 Chronic diastolic (congestive) heart failure: Secondary | ICD-10-CM

## 2021-06-11 DIAGNOSIS — I10 Essential (primary) hypertension: Secondary | ICD-10-CM | POA: Diagnosis not present

## 2021-06-11 DIAGNOSIS — IMO0002 Reserved for concepts with insufficient information to code with codable children: Secondary | ICD-10-CM

## 2021-06-11 MED ORDER — PREDNISONE 10 MG (21) PO TBPK: ORAL_TABLET | ORAL | 0 refills | Status: AC

## 2021-06-11 NOTE — Telephone Encounter (Signed)
Requested medication (s) are due for refill today: no receipt confirmed by pharmacy 06/03/21 3:18pm  Requested medication (s) are on the active medication list: yes  Last refill:  06/03/21 #90 0 refills  Future visit scheduled: yes  Notes to clinic:  pharmacy requested DX code .      Requested Prescriptions  Pending Prescriptions Disp Refills   FARXIGA 10 MG TABS tablet [Pharmacy Med Name: FARXIGA 10 MG TABLET] 90 tablet 0    Sig: TAKE 1 TABLET BY MOUTH EVERY DAY      Endocrinology:  Diabetes - SGLT2 Inhibitors Failed - 06/11/2021  3:13 PM      Failed - LDL in normal range and within 360 days    Ldl Cholesterol, Calc  Date Value Ref Range Status  04/03/2013 35 0 - 100 mg/dL Final   LDL Chol Calc (NIH)  Date Value Ref Range Status  05/15/2020 67 0 - 99 mg/dL Final          Passed - Cr in normal range and within 360 days    Creat  Date Value Ref Range Status  12/13/2017 1.39 (H) 0.70 - 1.25 mg/dL Final    Comment:    For patients >83 years of age, the reference limit for Creatinine is approximately 13% higher for people identified as African-American. .    Creatinine, Ser  Date Value Ref Range Status  09/09/2020 1.18 0.61 - 1.24 mg/dL Final   Creatinine, Urine  Date Value Ref Range Status  06/24/2020 73 mg/dL Final          Passed - HBA1C is between 0 and 7.9 and within 180 days    Hemoglobin A1C  Date Value Ref Range Status  05/04/2021 7.6 (A) 4.0 - 5.6 % Final   Hgb A1c MFr Bld  Date Value Ref Range Status  07/25/2020 7.6 (H) 4.8 - 5.6 % Final    Comment:    (NOTE)         Prediabetes: 5.7 - 6.4         Diabetes: >6.4         Glycemic control for adults with diabetes: <7.0           Passed - eGFR in normal range and within 360 days    GFR, Est African American  Date Value Ref Range Status  12/13/2017 60 > OR = 60 mL/min/1.25m Final   GFR calc Af Amer  Date Value Ref Range Status  09/09/2020 >60 >60 mL/min Final   GFR, Est Non African American   Date Value Ref Range Status  12/13/2017 52 (L) > OR = 60 mL/min/1.744mFinal   GFR calc non Af Amer  Date Value Ref Range Status  09/09/2020 >60 >60 mL/min Final          Passed - Valid encounter within last 6 months    Recent Outpatient Visits           [facility]   1 month [facility], [facility]   3 months ago [facility]   6 months [facility], [facility]   7 months ago Chronic bilateral low back pain with bilateral sciatica  [facility] Jerrol Banana., MD       Future Appointments             In 2 months Jerrol Banana., MD [facility], [place]

## 2021-06-11 NOTE — Progress Notes (Signed)
Established patient visit   Patient: Jerry Parsons   DOB: 1951-08-18   70 y.o. Male  MRN: [id number] Visit Date: 06/11/2021  Today's healthcare provider: [name], MD   Chief Complaint  Patient presents with   Chest Pain   Subjective    Chest Pain  This is a recurrent problem. The current episode started more than 1 month ago. The onset quality is gradual. The problem occurs daily. The problem has been waxing and waning. The pain is present in the lateral region (right side). The quality of the pain is described as sharp. The pain does not radiate. Associated symptoms include diaphoresis, leg pain, lower extremity edema, malaise/fatigue and weakness. Pertinent negatives include no abdominal pain, back pain, claudication, cough, dizziness, exertional chest pressure, fever, headaches, hemoptysis, irregular heartbeat, nausea, near-syncope, numbness, orthopnea, palpitations, PND, shortness of breath, sputum production, syncope or vomiting. The pain is aggravated by exertion. He has tried nothing for the symptoms. Risk factors include male gender, being elderly and obesity.  His past medical history is significant for diabetes and hypertension.   Walkingt oximetry in office was 89, 90 at rest This is been bothering him for a couple of months but is been worse in the past week. Scribes her right anterior chest wall pain anterior and superior to the nipple.  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   Primary hyperparathyroidism ([place])    Loss of consciousness ([place]) 06/05/2020   Osteoarthritis of both knees 11/11/2018   OSA on CPAP 10/14/2018   MRSA bacteremia 05/19/2018   Lumbar spondylosis 05/05/2018   Spinal stenosis of lumbar region 05/05/2018   Constipation, chronic 03/08/2018   Pain in limb 03/03/2018   Gastroesophageal reflux disease without esophagitis 01/04/2018   OA (osteoarthritis) of knee 06/20/2017   Sepsis  due to pneumonia ([place]) 05/07/2017   Neuropathy 10/13/2016   Amblyopia 12/30/2015   Cornea scar 12/30/2015   NS (nuclear sclerosis) 12/30/2015   Pseudoaphakia 12/30/2015   Dehydration 09/08/2015   Aspiration pneumonia ([place]) 07/04/2015   Paroxysmal atrial fibrillation ([place]) 07/04/2015   Arthritis of knee, degenerative 05/28/2015   Chronic back pain 09/17/2014   Leg edema 05/07/2014   Chronic diastolic CHF (congestive heart failure) ([place]) 05/07/2014   Campylobacter diarrhea 04/25/2013   Atrial flutter ([place]) 01/23/2013   Obesity 05/19/2012   Hyperlipidemia 29/92/4268   Diastolic dysfunction 34/19/6222   SOB (shortness of breath) 04/12/2011   Diabetes mellitus type 2, uncontrolled, with complications ([place]) 97/98/9211   HYPERTENSION, BENIGN 03/15/2011   DVT 03/15/2011   TACHYCARDIA 03/15/2011   Past Medical History:  Diagnosis Date   Arrhythmia    tachycardia, A-Fib   Arthritis    Blind right eye    BPH (benign prostatic hyperplasia)    Diabetes mellitus    DVT (deep venous thrombosis) ([place]) 02/2010   leg thrombus ; dislodged into emboli and caused PE   Dyspnea    Dysrhythmia    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    x2   GERD (gastroesophageal reflux disease)    Headache    h/o as a child   Hypercholesteremia    Hypertension    Kidney failure    acute   Neuromuscular disorder ([place])    Neuropathy    Pneumonia    time 9 ;last episode 12/2015   Pulmonary embolus ([place]) 2011   Seasonal allergies    Seizures ([place])    as child  Sleep apnea    BIPAP   Stiff neck    limited turning s/p titanium plate placement   TIA (transient ischemic attack)    Wears dentures    full upper and lower   Family History  Problem Relation Age of Onset   Heart attack Brother    Allergies  Allergen Reactions   Carisoprodol Itching   Tamsulosin     Pt stated, "upsets my stomach"       Medications: Outpatient Medications Prior to Visit  Medication Sig   amiodarone  (PACERONE) 200 MG tablet Take 1 tablet (200 mg total) by mouth daily.   apixaban (ELIQUIS) 5 MG TABS tablet Take 1 tablet (5 mg total) by mouth 2 (two) times daily.   cyclobenzaprine (FLEXERIL) 10 MG tablet TAKE 1 TABLET BY MOUTH 3 TIMES A DAY   docusate sodium (COLACE) 100 MG capsule Take 300 mg by mouth daily.   dutasteride (AVODART) 0.5 MG capsule TAKE 1 CAPSULE DAILY   esomeprazole (NEXIUM) 40 MG capsule Take 1 capsule (40 mg total) by mouth 2 (two) times daily.   FARXIGA 10 MG TABS tablet TAKE 1 TABLET BY MOUTH EVERY DAY   ferrous sulfate 325 (65 FE) MG tablet Take by mouth.   fexofenadine (ALLEGRA) 180 MG tablet Take 180 mg by mouth daily.   furosemide (LASIX) 40 MG tablet TAKE 1 TABLET (40 MG) BY MOUTH ONCE DAILY, YOU MAY TAKE 1 EXTRA TABLET (40 MG) AFTER LUNCH AS NEEDED FOR LEG SWELLING/ ABDOMINAL TIGHTNESS   gabapentin (NEURONTIN) 100 MG capsule TAKE 1 CAPSULE BY MOUTH TWICE A DAY   hydrochlorothiazide (HYDRODIURIL) 12.5 MG tablet TAKE 2 TABLETS (25 MG TOTAL) BY MOUTH AT BEDTIME.   insulin aspart (NOVOLOG FLEXPEN) 100 UNIT/ML FlexPen Inject 14 Units into the skin in the morning and at bedtime.   Insulin Glargine (LANTUS SOLOSTAR [place]) Inject 40 Units into the skin 2 (two) times daily.   Insulin Pen Needle (PEN NEEDLES) 31G X 5 MM MISC 1 each by Does not apply route daily. PATIENT NEEDS NOVA TWIST NEEDLES 5 MM  DX E11.9   JANUVIA 100 MG tablet TAKE 1 TABLET DAILY   liraglutide (VICTOZA) 18 MG/3ML SOPN Inject 1.8 mg into the skin daily.   magnesium oxide (MAG-OX) 400 MG tablet TAKE 1 TABLET (400 MG TOTAL) BY MOUTH 2 (TWO) TIMES DAILY.   MELATONIN PO Take 2 tablets by mouth at bedtime. Gummies   metoprolol tartrate (LOPRESSOR) 100 MG tablet TAKE 1 TABLET BY MOUTH TWICE A DAY   mupirocin cream (BACTROBAN) 2 % APPLY TO AFFECTED AREA TWICE A DAY   Naloxone HCl 0.4 MG/0.4ML SOAJ Inject as needed for suspected overdose.   NOVOTWIST PEN NEEDLE 32G X 5 MM MISC USE 6 TIMES A DAY DX E11.9   Omega-3  Fatty Acids (FISH OIL PO) Take 1 capsule by mouth in the morning and at bedtime.   Omega-3 Fatty Acids (FISH OIL) 1200 MG CAPS Take 1,200 mg by mouth 2 (two) times daily.    ONETOUCH ULTRA test strip USE WITH METER TWICE DAILY   oxyCODONE (OXYCONTIN) 40 mg 12 hr tablet Take 1 tablet (40 mg total) by mouth every 8 (eight) hours as needed.   pregabalin (LYRICA) 200 MG capsule TAKE 1 CAPSULE 3 TIMES A   DAY   rOPINIRole (REQUIP) 1 MG tablet TAKE 1-2 TABLETS BY MOUTH AT BEDTIME AS NEEDED   rOPINIRole (REQUIP) 2 MG tablet TAKE 1 TABLET BY MOUTH TWICE A DAY AS NEEDED  sucralfate (CARAFATE) 1 g tablet TAKE 1 TABLET BY MOUTH 4 (FOUR) TIMES DAILY - WITH MEALS AND AT BEDTIME.   tamsulosin (FLOMAX) 0.4 MG CAPS capsule Take 0.4 mg by mouth daily.   testosterone cypionate (DEPOTESTOSTERONE CYPIONATE) 200 MG/ML injection Inject 200 mg into the muscle every 21 ( twenty-one) days.    No facility-administered medications prior to visit.    Review of Systems  Constitutional:  Positive for diaphoresis and malaise/fatigue. Negative for fever.  Respiratory:  Negative for cough, hemoptysis, sputum production and shortness of breath.   Cardiovascular:  Positive for chest pain. Negative for palpitations, orthopnea, claudication, syncope, PND and near-syncope.  Gastrointestinal:  Negative for abdominal pain, nausea and vomiting.  Musculoskeletal:  Negative for back pain.  Neurological:  Positive for weakness. Negative for dizziness, numbness and headaches.       Objective    BP (!) 141/61   Pulse 86   Temp 98.2 F (36.8 C) (Oral)   Resp 16   Wt 271 lb 3.2 oz (123 kg)   SpO2 95%   BMI 37.82 kg/m  BP Readings from Last 3 Encounters:  06/11/21 (!) 141/61  05/04/21 114/69  03/13/21 140/84   Wt Readings from Last 3 Encounters:  06/11/21 271 lb 3.2 oz (123 kg)  05/04/21 266 lb (120.7 kg)  03/13/21 264 lb (119.7 kg)       Physical Exam Vitals reviewed.  Constitutional:      Appearance: He is  well-developed. He is obese.  HENT:     Head: Normocephalic and atraumatic.     Right Ear: External ear normal.     Left Ear: External ear normal.     Nose: Nose normal.  Eyes:     General: No scleral icterus.    Conjunctiva/sclera: Conjunctivae normal.  Neck:     Thyroid: No thyromegaly.  Cardiovascular:     Rate and Rhythm: Normal rate and regular rhythm.     Heart sounds: Normal heart sounds.  Pulmonary:     Effort: Pulmonary effort is normal.     Breath sounds: Normal breath sounds.  Abdominal:     Palpations: Abdomen is soft.  Musculoskeletal:     Right lower leg: Edema present.     Left lower leg: Edema present.     Comments: 1+ LE edema.   Mild pain reproduction in the right chest wall with palpation in the area.  Skin:    General: Skin is warm and dry.  Neurological:     Mental Status: He is alert and oriented to person, place, and time. Mental status is at baseline.     Comments: Decreased sensation to monofilament in bilateral anterior feet. No nystagmus.  Gait is normal.  Psychiatric:        Mood and Affect: Mood normal.        Behavior: Behavior normal.        Thought Content: Thought content normal.        Judgment: Judgment normal.      No results found for any visits on 06/11/21.  Assessment & Plan     1. Intercostal pain Think this is chest wall pain but due to significance of discomfort and mild hypoxia with ambulating will obtain chest CT - CT Chest Wo Contrast - predniSONE (STERAPRED UNI-PAK 21 TAB) 10 MG (21) TBPK tablet; Take 6 tablets (60 mg total) by mouth daily for 1 day, THEN 5 tablets (50 mg total) daily for 1 day, THEN 4 tablets (40 mg total)  daily for 1 day, THEN 3 tablets (30 mg total) daily for 1 day, THEN 2 tablets (20 mg total) daily for 1 day, THEN 1 tablet (10 mg total) daily for 1 day.  Dispense: 21 tablet; Refill: 0  2. Chest wall pain Treat with prednisone and follow-up in a few weeks. I do not think this is cardiac./ischemic 3.  HYPERTENSION, BENIGN Controlled  4. Paroxysmal atrial fibrillation (HCC) On Eliquis  5. Chronic diastolic CHF (congestive heart failure) (HCC) Up with chest x-ray and chest CT  6. Diabetes mellitus type 2, uncontrolled, with complications (HCC) Goal A1c less than 7-7.5  7. Neuropathy Diabetes and spinal disease  8. Primary osteoarthritis of both knees   9. Lumbar spondylosis   10. Mixed hyperlipidemia   11. Chronic bilateral low back pain with bilateral sciatica On high-dose narcotics.  Would love for patient to start cutting back on dosing.  Failed 5 spinal surgeries.   No follow-ups on file.      I, [name], MD, have reviewed all documentation for this visit. The documentation on 06/18/21 for the exam, diagnosis, procedures, and orders are all accurate and complete.    Kellye Mizner Cranford Mon, MD  [facility] [phone] (phone) [phone] (fax)  [place]

## 2021-06-23 ENCOUNTER — Other Ambulatory Visit: Payer: Self-pay

## 2021-06-23 ENCOUNTER — Ambulatory Visit
Admission: RE | Admit: 2021-06-23 | Discharge: 2021-06-23 | Disposition: A | Discharge status: Home / Self Care | Payer: Medicare Other | Source: Ambulatory Visit | Attending: Family Medicine | Admitting: Family Medicine

## 2021-06-23 DIAGNOSIS — J439 Emphysema, unspecified: Secondary | ICD-10-CM | POA: Diagnosis not present

## 2021-06-23 DIAGNOSIS — R079 Chest pain, unspecified: Secondary | ICD-10-CM | POA: Diagnosis not present

## 2021-06-23 DIAGNOSIS — I700 Atherosclerosis of aorta: Secondary | ICD-10-CM | POA: Diagnosis not present

## 2021-06-23 DIAGNOSIS — R0789 Other chest pain: Secondary | ICD-10-CM | POA: Diagnosis not present

## 2021-06-23 DIAGNOSIS — R0782 Intercostal pain: Secondary | ICD-10-CM | POA: Insufficient documentation

## 2021-06-24 DIAGNOSIS — E291 Testicular hypofunction: Secondary | ICD-10-CM | POA: Diagnosis not present

## 2021-06-25 ENCOUNTER — Other Ambulatory Visit: Payer: Self-pay

## 2021-06-25 ENCOUNTER — Encounter: Payer: Self-pay | Admitting: Sports Medicine

## 2021-06-25 ENCOUNTER — Ambulatory Visit (INDEPENDENT_AMBULATORY_CARE_PROVIDER_SITE_OTHER): Payer: Medicare Other | Admitting: Sports Medicine

## 2021-06-25 DIAGNOSIS — M79675 Pain in left toe(s): Secondary | ICD-10-CM

## 2021-06-25 DIAGNOSIS — E1142 Type 2 diabetes mellitus with diabetic polyneuropathy: Secondary | ICD-10-CM | POA: Diagnosis not present

## 2021-06-25 DIAGNOSIS — B351 Tinea unguium: Secondary | ICD-10-CM | POA: Diagnosis not present

## 2021-06-25 DIAGNOSIS — M79674 Pain in right toe(s): Secondary | ICD-10-CM

## 2021-06-25 DIAGNOSIS — I739 Peripheral vascular disease, unspecified: Secondary | ICD-10-CM

## 2021-06-25 NOTE — Progress Notes (Signed)
Subjective: Jerry Parsons is a 70 y.o. male patient with history of diabetes who presents to office today complaining of long,mildly painful nails while ambulating in shoes; unable to trim.  Patient states that the glucose has been doing good around 180s. A1c 7.6. No new problems.  Last PCP visit a few weeks ago.  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[place]) 07/25/2020   Primary hyperparathyroidism ([place])    Loss of consciousness ([place]) 06/05/2020   Osteoarthritis of both knees 11/11/2018   OSA on CPAP 10/14/2018   MRSA bacteremia 05/19/2018   Lumbar spondylosis 05/05/2018   Spinal stenosis of lumbar region 05/05/2018   Constipation, chronic 03/08/2018   Pain in limb 03/03/2018   Gastroesophageal reflux disease without esophagitis 01/04/2018   OA (osteoarthritis) of knee 06/20/2017   Sepsis due to pneumonia ([place]) 05/07/2017   Neuropathy 10/13/2016   Amblyopia 12/30/2015   Cornea scar 12/30/2015   NS (nuclear sclerosis) 12/30/2015   Pseudoaphakia 12/30/2015   Dehydration 09/08/2015   Aspiration pneumonia ([place]) 07/04/2015   Paroxysmal atrial fibrillation ([place]) 07/04/2015   Arthritis of knee, degenerative 05/28/2015   Chronic back pain 09/17/2014   Leg edema 05/07/2014   Chronic diastolic CHF (congestive heart failure) ([place]) 05/07/2014   Campylobacter diarrhea 04/25/2013   Atrial flutter ([place]) 01/23/2013   Obesity 05/19/2012   Hyperlipidemia 95/63/8756   Diastolic dysfunction 43/32/9518   SOB (shortness of breath) 04/12/2011   Diabetes mellitus type 2, uncontrolled, with complications ([place]) 84/16/6063   HYPERTENSION, BENIGN 03/15/2011   DVT 03/15/2011   TACHYCARDIA 03/15/2011   Current Outpatient Medications on File Prior to Visit  Medication Sig Dispense Refill   amiodarone (PACERONE) 200 MG tablet Take 1 tablet (200 mg total) by mouth daily. 90 tablet 1   apixaban (ELIQUIS) 5 MG TABS tablet Take 1 tablet (5 mg total) by mouth 2 (two)  times daily. 180 tablet 3   cyclobenzaprine (FLEXERIL) 10 MG tablet TAKE 1 TABLET BY MOUTH 3 TIMES A DAY 90 tablet 12   docusate sodium (COLACE) 100 MG capsule Take 300 mg by mouth daily.     dutasteride (AVODART) 0.5 MG capsule TAKE 1 CAPSULE DAILY 90 capsule 3   esomeprazole (NEXIUM) 40 MG capsule Take 1 capsule (40 mg total) by mouth 2 (two) times daily. 180 capsule 3   FARXIGA 10 MG TABS tablet TAKE 1 TABLET BY MOUTH EVERY DAY 90 tablet 0   ferrous sulfate 325 (65 FE) MG tablet Take by mouth.     fexofenadine (ALLEGRA) 180 MG tablet Take 180 mg by mouth daily.     furosemide (LASIX) 40 MG tablet TAKE 1 TABLET (40 MG) BY MOUTH ONCE DAILY, YOU MAY TAKE 1 EXTRA TABLET (40 MG) AFTER LUNCH AS NEEDED FOR LEG SWELLING/ ABDOMINAL TIGHTNESS 180 tablet 2   gabapentin (NEURONTIN) 100 MG capsule TAKE 1 CAPSULE BY MOUTH TWICE A DAY 180 capsule 1   hydrochlorothiazide (HYDRODIURIL) 12.5 MG tablet TAKE 2 TABLETS (25 MG TOTAL) BY MOUTH AT BEDTIME. 60 tablet 0   insulin aspart (NOVOLOG FLEXPEN) 100 UNIT/ML FlexPen Inject 14 Units into the skin in the morning and at bedtime. 15 mL 11   Insulin Glargine (LANTUS SOLOSTAR ) Inject 40 Units into the skin 2 (two) times daily.     Insulin Pen Needle (PEN NEEDLES) 31G X 5 MM MISC 1 each by Does not apply route daily. PATIENT NEEDS NOVA TWIST NEEDLES 5 MM  DX E11.9 200 each 3  JANUVIA 100 MG tablet TAKE 1 TABLET DAILY 90 tablet 3   liraglutide (VICTOZA) 18 MG/3ML SOPN Inject 1.8 mg into the skin daily. 27 mL 3   magnesium oxide (MAG-OX) 400 MG tablet TAKE 1 TABLET (400 MG TOTAL) BY MOUTH 2 (TWO) TIMES DAILY. 180 tablet 3   MELATONIN PO Take 2 tablets by mouth at bedtime. Gummies     metoprolol tartrate (LOPRESSOR) 100 MG tablet TAKE 1 TABLET BY MOUTH TWICE A DAY 60 tablet 12   mupirocin cream (BACTROBAN) 2 % APPLY TO AFFECTED AREA TWICE A DAY 30 g 0   Naloxone HCl 0.4 MG/0.4ML SOAJ Inject as needed for suspected overdose. 0.4 mL 2   NOVOTWIST PEN NEEDLE 32G X 5 MM  MISC USE 6 TIMES A DAY DX E11.9 100 each 1   Omega-3 Fatty Acids (FISH OIL PO) Take 1 capsule by mouth in the morning and at bedtime.     Omega-3 Fatty Acids (FISH OIL) 1200 MG CAPS Take 1,200 mg by mouth 2 (two) times daily.      ONETOUCH ULTRA test strip USE WITH METER TWICE DAILY 100 strip 10   oxyCODONE (OXYCONTIN) 40 mg 12 hr tablet Take 1 tablet (40 mg total) by mouth every 8 (eight) hours as needed. 270 tablet 0   pregabalin (LYRICA) 200 MG capsule TAKE 1 CAPSULE 3 TIMES A   DAY 270 capsule 1   rOPINIRole (REQUIP) 1 MG tablet TAKE 1-2 TABLETS BY MOUTH AT BEDTIME AS NEEDED 60 tablet 0   rOPINIRole (REQUIP) 2 MG tablet TAKE 1 TABLET BY MOUTH TWICE A DAY AS NEEDED 180 tablet 2   sucralfate (CARAFATE) 1 g tablet TAKE 1 TABLET BY MOUTH 4 (FOUR) TIMES DAILY - WITH MEALS AND AT BEDTIME. 120 tablet 0   tamsulosin (FLOMAX) 0.4 MG CAPS capsule Take 0.4 mg by mouth daily.     testosterone cypionate (DEPOTESTOSTERONE CYPIONATE) 200 MG/ML injection Inject 200 mg into the muscle every 21 ( twenty-one) days.      No current facility-administered medications on file prior to visit.   Allergies  Allergen Reactions   Carisoprodol Itching   Tamsulosin     Pt stated, "upsets my stomach"    Objective: General: Patient is awake, alert, and oriented x 3 and in no acute distress.  Integument: Skin is warm, dry and supple bilateral. Nails are tender, long, thickened and dystrophic with subungual debris, consistent with onychomycosis, 1-5 bilateral. No open lesions or preulcerative lesions present bilateral. Remaining integument unremarkable. .  Vasculature:  Dorsalis Pedis pulse 0/4 bilateral. Posterior Tibial pulse  0/4 bilateral. Capillary fill time <3 sec 1-5 bilateral. No hair growth to the level of the digits.Temperature gradient within normal limits. No varicosities present bilateral. 1+ pitting edema present bilateral. +Stucco dermatitis due to PVD without infection.   Neurology: The patient has  absent sensation measured with a 5.07/10g Semmes Weinstein Monofilament at all pedal sites bilateral, unchanged from prior  Musculoskeletal: Asymptomatic hammertoes pedal deformities noted bilateral R>L.  Muscular strength 5/5 in all lower extremity muscular groups bilateral without pain on range of motion. No tenderness with calf compression bilateral.  Assessment and Plan: Problem List Items Addressed This Visit   None Visit Diagnoses     Pain due to onychomycosis of toenails of both feet    -  Primary   Diabetic polyneuropathy associated with type 2 diabetes mellitus (HCC)       PAD (peripheral artery disease) ([place])          -  Examined patient. -Re-Discussed and educated patient on diabetic foot care, especially with  regards to the vascular, neurological and musculoskeletal systems.  -Mechanically debrided all nails 1-5 bilateral using sterile nail nipper and filed with dremel without incident  -Answered all patient questions -Patient to return  in 2.5 to 3 months for at risk foot care or sooner if problems arise.  Landis Martins, DPM

## 2021-06-30 ENCOUNTER — Telehealth: Payer: Self-pay

## 2021-06-30 NOTE — Telephone Encounter (Signed)
Copied from [place] [id number]. Topic: General - Inquiry >> Jun 30, 2021  3:15 PM Loma [place] wrote: Reason for CRM: pt had X-ray last week and are concerned have not heard back, are trying to figure out from [place], are very sweet & sorry to bother but would like a CB if possible today at [phone]

## 2021-07-01 NOTE — Telephone Encounter (Signed)
Please review. CT scan results. Thanks!

## 2021-07-02 NOTE — Telephone Encounter (Signed)
Patient's wife is calling again about CT results. Please review. Thanks!

## 2021-07-02 NOTE — Addendum Note (Signed)
Addended by: Eulas Post on: 07/02/2021 10:34 AM   Modules accepted: Orders

## 2021-07-02 NOTE — Telephone Encounter (Signed)
Pt is calling and is in a lot of pain and they are needing to know what to do, have seen results but do not understand and want a FU call , scan 6/28, Spoke with Elmyra Ricks  at 9:20 7/7 and Dr [name] has been alerted and  I have told pt that he will be reaching out this am, pt states to call [phone] or [phone]. He has both phones with him. Elmyra Ricks requesting CRM

## 2021-07-08 ENCOUNTER — Other Ambulatory Visit: Payer: Self-pay

## 2021-07-08 ENCOUNTER — Encounter
Admission: RE | Admit: 2021-07-08 | Discharge: 2021-07-08 | Disposition: A | Discharge status: Home / Self Care | Payer: Medicare Other | Source: Ambulatory Visit | Attending: Family Medicine | Admitting: Family Medicine

## 2021-07-08 DIAGNOSIS — R0781 Pleurodynia: Secondary | ICD-10-CM | POA: Diagnosis not present

## 2021-07-08 DIAGNOSIS — Z87828 Personal history of other (healed) physical injury and trauma: Secondary | ICD-10-CM | POA: Diagnosis not present

## 2021-07-08 DIAGNOSIS — R0789 Other chest pain: Secondary | ICD-10-CM | POA: Insufficient documentation

## 2021-07-08 DIAGNOSIS — M170 Bilateral primary osteoarthritis of knee: Secondary | ICD-10-CM | POA: Diagnosis not present

## 2021-07-08 DIAGNOSIS — M19011 Primary osteoarthritis, right shoulder: Secondary | ICD-10-CM | POA: Diagnosis not present

## 2021-07-08 MED ORDER — TECHNETIUM TC 99M MEDRONATE IV KIT
20.0000 | PACK | Freq: Once | INTRAVENOUS | Status: AC | PRN
  Administered 2021-07-08: 22.54 via INTRAVENOUS

## 2021-07-09 ENCOUNTER — Other Ambulatory Visit: Payer: Self-pay | Admitting: Family Medicine

## 2021-07-09 DIAGNOSIS — K219 Gastro-esophageal reflux disease without esophagitis: Secondary | ICD-10-CM

## 2021-07-14 ENCOUNTER — Other Ambulatory Visit: Payer: Self-pay | Admitting: Family Medicine

## 2021-07-15 ENCOUNTER — Telehealth: Payer: Self-pay

## 2021-07-15 NOTE — Telephone Encounter (Signed)
Looking at chart it appears patient had her bone scan on 07/08/21, I dont see an addendum to result to patient. Please have Dr. [name] review and advise. KW

## 2021-07-15 NOTE — Telephone Encounter (Signed)
Please review. Thanks!  

## 2021-07-15 NOTE — Telephone Encounter (Signed)
Copied from [place] [id number]. Topic: General - Call Back - No Documentation >> Jul 15, 2021  2:14 PM Erick Blinks wrote: [phone]   Pt's wife called requesting to speak with office regarding bone density scan please advise

## 2021-07-16 ENCOUNTER — Other Ambulatory Visit: Payer: Self-pay

## 2021-07-16 DIAGNOSIS — N400 Enlarged prostate without lower urinary tract symptoms: Secondary | ICD-10-CM

## 2021-07-16 NOTE — Progress Notes (Signed)
Ordered per Dr. Darnell Level

## 2021-07-17 ENCOUNTER — Other Ambulatory Visit: Payer: Self-pay | Admitting: Family Medicine

## 2021-07-17 LAB — PSA: Prostate Specific Ag, Serum: 0.2 ng/mL (ref 0.0–4.0)

## 2021-07-21 ENCOUNTER — Other Ambulatory Visit: Payer: Self-pay | Admitting: Family Medicine

## 2021-07-21 DIAGNOSIS — R110 Nausea: Secondary | ICD-10-CM

## 2021-07-21 NOTE — Telephone Encounter (Signed)
Requested Prescriptions  Pending Prescriptions Disp Refills  . sucralfate (CARAFATE) 1 g tablet [Pharmacy Med Name: SUCRALFATE 1 GM TABLET] 120 tablet 0    Sig: TAKE 1 TABLET BY MOUTH 4 (FOUR) TIMES DAILY - WITH MEALS AND AT BEDTIME.     Gastroenterology: Antiacids Passed - 07/21/2021  1:38 AM      Passed - Valid encounter within last 12 months    Recent Outpatient Visits          1 month ago Intercostal pain   [facility] Jerrol Banana., MD   2 months ago Diabetes mellitus type 2, uncontrolled, with complications [facility])   [facility] Jerrol Banana., MD   4 months ago Mixed simple and mucopurulent chronic bronchitis [facility])   [facility] Jerrol Banana., MD   7 months ago Diabetes mellitus type 2, uncontrolled, with complications [facility])   [facility] Jerrol Banana., MD   8 months ago Chronic bilateral low back pain with bilateral sciatica   [facility] Jerrol Banana., MD      Future Appointments            In 1 month Jerrol Banana., MD [facility]

## 2021-07-22 ENCOUNTER — Encounter: Payer: Self-pay | Admitting: Family Medicine

## 2021-07-22 ENCOUNTER — Ambulatory Visit (INDEPENDENT_AMBULATORY_CARE_PROVIDER_SITE_OTHER): Payer: Medicare Other | Admitting: Family Medicine

## 2021-07-22 ENCOUNTER — Other Ambulatory Visit: Payer: Self-pay

## 2021-07-22 VITALS — BP 154/84 | HR 68 | Temp 98.4°F | Resp 18 | Wt 270.0 lb

## 2021-07-22 DIAGNOSIS — M5442 Lumbago with sciatica, left side: Secondary | ICD-10-CM

## 2021-07-22 DIAGNOSIS — E118 Type 2 diabetes mellitus with unspecified complications: Secondary | ICD-10-CM | POA: Diagnosis not present

## 2021-07-22 DIAGNOSIS — M5441 Lumbago with sciatica, right side: Secondary | ICD-10-CM | POA: Diagnosis not present

## 2021-07-22 DIAGNOSIS — T148XXA Other injury of unspecified body region, initial encounter: Secondary | ICD-10-CM | POA: Diagnosis not present

## 2021-07-22 DIAGNOSIS — IMO0002 Reserved for concepts with insufficient information to code with codable children: Secondary | ICD-10-CM

## 2021-07-22 DIAGNOSIS — L03119 Cellulitis of unspecified part of limb: Secondary | ICD-10-CM

## 2021-07-22 DIAGNOSIS — M47816 Spondylosis without myelopathy or radiculopathy, lumbar region: Secondary | ICD-10-CM | POA: Diagnosis not present

## 2021-07-22 DIAGNOSIS — M170 Bilateral primary osteoarthritis of knee: Secondary | ICD-10-CM

## 2021-07-22 DIAGNOSIS — G629 Polyneuropathy, unspecified: Secondary | ICD-10-CM | POA: Diagnosis not present

## 2021-07-22 DIAGNOSIS — G4733 Obstructive sleep apnea (adult) (pediatric): Secondary | ICD-10-CM

## 2021-07-22 DIAGNOSIS — E1165 Type 2 diabetes mellitus with hyperglycemia: Secondary | ICD-10-CM | POA: Diagnosis not present

## 2021-07-22 DIAGNOSIS — I5032 Chronic diastolic (congestive) heart failure: Secondary | ICD-10-CM

## 2021-07-22 DIAGNOSIS — I10 Essential (primary) hypertension: Secondary | ICD-10-CM | POA: Diagnosis not present

## 2021-07-22 DIAGNOSIS — G8929 Other chronic pain: Secondary | ICD-10-CM

## 2021-07-22 DIAGNOSIS — Z9989 Dependence on other enabling machines and devices: Secondary | ICD-10-CM

## 2021-07-22 MED ORDER — MUPIROCIN 2 % EX OINT: 1.0000 "application " | TOPICAL_OINTMENT | Freq: Two times a day (BID) | CUTANEOUS | 0 refills | Status: DC | PRN

## 2021-07-22 NOTE — Patient Instructions (Signed)
WEAR SUPPORT HOSE!!  Cellulitis, Adult  Cellulitis is a skin infection. The infected area is usually warm, red, swollen, and tender. This condition occurs most often in the arms and lower legs. The infection can travel to the muscles, blood, and underlying tissue andbecome serious. It is very important to get treated for this condition. What are the causes? Cellulitis is caused by bacteria. The bacteria enter through a break in theskin, such as a cut, burn, insect bite, open sore, or crack. What increases the risk? This condition is more likely to occur in people who: Have a weak body defense system (immune system). Have open wounds on the skin, such as cuts, burns, bites, and scrapes. Bacteria can enter the body through these open wounds. Are older than 71 years of age. Have diabetes. Have a type of long-lasting (chronic) liver disease (cirrhosis) or kidney disease. Are obese. Have a skin condition such as: Itchy rash (eczema). Slow movement of blood in the veins (venous stasis). Fluid buildup below the skin (edema). Have had radiation therapy. Use IV drugs. What are the signs or symptoms? Symptoms of this condition include: Redness, streaking, or spotting on the skin. Swollen area of the skin. Tenderness or pain when an area of the skin is touched. Warm skin. A fever. Chills. Blisters. How is this diagnosed? This condition is diagnosed based on a medical history and physical exam. You may also have tests, including: Blood tests. Imaging tests. How is this treated? Treatment for this condition may include: Medicines, such as antibiotic medicines or medicines to treat allergies (antihistamines). Supportive care, such as rest and application of cold or warm cloths (compresses) to the skin. Hospital care, if the condition is severe. The infection usually starts to get better within 1-2 days of treatment. Follow these instructions at home:  Medicines Take over-the-counter and  prescription medicines only as told by your health care provider. If you were prescribed an antibiotic medicine, take it as told by your health care provider. Do not stop taking the antibiotic even if you start to feel better. General instructions Drink enough fluid to keep your urine pale yellow. Do not touch or rub the infected area. Raise (elevate) the infected area above the level of your heart while you are sitting or lying down. Apply warm or cold compresses to the affected area as told by your health care provider. Keep all follow-up visits as told by your health care provider. This is important. These visits let your health care provider make sure a more serious infection is not developing. Contact a health care provider if: You have a fever. Your symptoms do not begin to improve within 1-2 days of starting treatment. Your bone or joint underneath the infected area becomes painful after the skin has healed. Your infection returns in the same area or another area. You notice a swollen bump in the infected area. You develop new symptoms. You have a general ill feeling (malaise) with muscle aches and pains. Get help right away if: Your symptoms get worse. You feel very sleepy. You develop vomiting or diarrhea that persists. You notice red streaks coming from the infected area. Your red area gets larger or turns dark in color. These symptoms may represent a serious problem that is an emergency. Do not wait to see if the symptoms will go away. Get medical help right away. Call your local emergency services (911 in the [place] Do not drive yourself to the hospital. Summary Cellulitis is a skin infection. This condition  occurs most often in the arms and lower legs. Treatment for this condition may include medicines, such as antibiotic medicines or antihistamines. Take over-the-counter and prescription medicines only as told by your health care provider. If you were prescribed an antibiotic  medicine, do not stop taking the antibiotic even if you start to feel better. Contact a health care provider if your symptoms do not begin to improve within 1-2 days of starting treatment or your symptoms get worse. Keep all follow-up visits as told by your health care provider. This is important. These visits let your health care provider make sure that a more serious infection is not developing. This information is not intended to replace advice given to you by your health care provider. Make sure you discuss any questions you have with your healthcare provider. Document Revised: 12/24/2019 Document Reviewed: 05/04/2018 Elsevier Patient Education  [place].

## 2021-07-22 NOTE — Progress Notes (Signed)
I,[name],acting as a scribe for Jerry Durie, MD.,have documented all relevant documentation on the behalf of Jerry Durie, MD,as directed by  Jerry Durie, MD while in the presence of Jerry Durie, MD.   Established patient visit   Patient: Jerry Parsons   DOB: Oct 04, 1951   70 y.o. Male  MRN: [id number] Visit Date: 07/22/2021  Today's healthcare provider: [name], MD   No chief complaint on file.  Subjective    HPI  Patient has had redness and sores on the bottom of his legs for the past 2 weeks. Sores have been draining. He has chronic venous stasis changes in edema of his legs.  He has had no fever no purulent drainage no tenderness no erythema or induration.      Medications: Outpatient Medications Prior to Visit  Medication Sig   amiodarone (PACERONE) 200 MG tablet Take 1 tablet (200 mg total) by mouth daily.   apixaban (ELIQUIS) 5 MG TABS tablet Take 1 tablet (5 mg total) by mouth 2 (two) times daily.   cyclobenzaprine (FLEXERIL) 10 MG tablet TAKE 1 TABLET BY MOUTH 3 TIMES A DAY   docusate sodium (COLACE) 100 MG capsule Take 300 mg by mouth daily.   dutasteride (AVODART) 0.5 MG capsule TAKE 1 CAPSULE DAILY   esomeprazole (NEXIUM) 40 MG capsule TAKE 1 CAPSULE TWICE DAILY   FARXIGA 10 MG TABS tablet TAKE 1 TABLET BY MOUTH EVERY DAY   ferrous sulfate 325 (65 FE) MG tablet Take by mouth.   fexofenadine (ALLEGRA) 180 MG tablet Take 180 mg by mouth daily.   furosemide (LASIX) 40 MG tablet TAKE 1 TABLET (40 MG) BY MOUTH ONCE DAILY, YOU MAY TAKE 1 EXTRA TABLET (40 MG) AFTER LUNCH AS NEEDED FOR LEG SWELLING/ ABDOMINAL TIGHTNESS   gabapentin (NEURONTIN) 100 MG capsule TAKE 1 CAPSULE BY MOUTH TWICE A DAY   hydrochlorothiazide (HYDRODIURIL) 12.5 MG tablet TAKE 2 TABLETS (25 MG TOTAL) BY MOUTH AT BEDTIME.   insulin aspart (NOVOLOG FLEXPEN) 100 UNIT/ML FlexPen Inject 14 Units into the skin in the morning and at bedtime.   Insulin Glargine  (LANTUS SOLOSTAR [place]) Inject 40 Units into the skin 2 (two) times daily.   Insulin Pen Needle (PEN NEEDLES) 31G X 5 MM MISC 1 each by Does not apply route daily. PATIENT NEEDS NOVA TWIST NEEDLES 5 MM  DX E11.9   JANUVIA 100 MG tablet TAKE 1 TABLET DAILY   liraglutide (VICTOZA) 18 MG/3ML SOPN Inject 1.8 mg into the skin daily.   magnesium oxide (MAG-OX) 400 MG tablet TAKE 1 TABLET (400 MG TOTAL) BY MOUTH 2 (TWO) TIMES DAILY.   MELATONIN PO Take 2 tablets by mouth at bedtime. Gummies   metoprolol tartrate (LOPRESSOR) 100 MG tablet TAKE 1 TABLET BY MOUTH TWICE A DAY   mupirocin cream (BACTROBAN) 2 % APPLY TO AFFECTED AREA TWICE A DAY   Naloxone HCl 0.4 MG/0.4ML SOAJ Inject as needed for suspected overdose.   NOVOTWIST PEN NEEDLE 32G X 5 MM MISC USE 6 TIMES A DAY DX E11.9   Omega-3 Fatty Acids (FISH OIL PO) Take 1 capsule by mouth in the morning and at bedtime.   Omega-3 Fatty Acids (FISH OIL) 1200 MG CAPS Take 1,200 mg by mouth 2 (two) times daily.    ONETOUCH ULTRA test strip USE WITH METER TWICE DAILY   oxyCODONE (OXYCONTIN) 40 mg 12 hr tablet Take 1 tablet (40 mg total) by mouth every 8 (eight) hours as  needed.   pregabalin (LYRICA) 200 MG capsule TAKE 1 CAPSULE 3 TIMES A   DAY   rOPINIRole (REQUIP) 1 MG tablet TAKE 1 TO 2 TABLETS BY MOUTH AT BEDTIME AS NEEDED   rOPINIRole (REQUIP) 2 MG tablet TAKE 1 TABLET BY MOUTH TWICE A DAY AS NEEDED   sucralfate (CARAFATE) 1 g tablet TAKE 1 TABLET BY MOUTH 4 (FOUR) TIMES DAILY - WITH MEALS AND AT BEDTIME.   tamsulosin (FLOMAX) 0.4 MG CAPS capsule Take 0.4 mg by mouth daily.   testosterone cypionate (DEPOTESTOSTERONE CYPIONATE) 200 MG/ML injection Inject 200 mg into the muscle every 21 ( twenty-one) days.    No facility-administered medications prior to visit.    Review of Systems  Constitutional:  Negative for appetite change, chills and fever.  Respiratory:  Negative for chest tightness, shortness of breath and wheezing.   Cardiovascular:  Negative  for chest pain and palpitations.  Gastrointestinal:  Negative for abdominal pain, nausea and vomiting.  Skin:  Positive for wound.       Objective    BP (!) 154/84 (BP Location: Right Arm, Patient Position: Sitting, Cuff Size: Large)   Pulse 68   Temp 98.4 F (36.9 C) (Oral)   Resp 18   Wt 270 lb (122.5 kg)   SpO2 92%   BMI 37.66 kg/m  BP Readings from Last 3 Encounters:  07/22/21 (!) 154/84  06/11/21 (!) 141/61  05/04/21 114/69   Wt Readings from Last 3 Encounters:  07/22/21 270 lb (122.5 kg)  06/11/21 271 lb 3.2 oz (123 kg)  05/04/21 266 lb (120.7 kg)       Physical Exam Vitals and nursing note reviewed.  Constitutional:      Appearance: Normal appearance. He is well-developed and normal weight.  HENT:     Head: Normocephalic and atraumatic.     Right Ear: External ear normal.     Left Ear: External ear normal.     Nose: Nose normal.  Eyes:     General: No scleral icterus.    Conjunctiva/sclera: Conjunctivae normal.  Neck:     Thyroid: No thyromegaly.  Cardiovascular:     Rate and Rhythm: Normal rate and regular rhythm.     Pulses: Normal pulses.     Heart sounds: Normal heart sounds.  Pulmonary:     Effort: Pulmonary effort is normal.     Breath sounds: Normal breath sounds.  Abdominal:     General: Bowel sounds are normal.     Palpations: Abdomen is soft.  Musculoskeletal:     Cervical back: Normal range of motion and neck supple.     Right lower leg: Edema present.     Left lower leg: Edema present.     Comments: He has 1-2+ chronic edema  Skin:    General: Skin is warm and dry.     Comments: He has chronic venous stasis changes lower extremities with some skin tears without evidence of any infection secondarily.  Neurological:     General: No focal deficit present.     Mental Status: He is alert and oriented to person, place, and time. Mental status is at baseline.     Comments: Decreased sensation to monofilament in bilateral anterior feet.    Psychiatric:        Mood and Affect: Mood normal.        Behavior: Behavior normal.        Thought Content: Thought content normal.        Judgment: Judgment  normal.      No results found for any visits on 07/22/21.  Assessment & Plan     1. Cellulitis of lower extremity, unspecified laterality I do not think the patient has active infection of the lower extremities but due to skin tears will protect with Bactroban ointment twice a day for the time being. - mupirocin ointment (BACTROBAN) 2 %; Apply 1 application topically 2 (two) times daily as needed.  Dispense: 30 g; Refill: 0  2. Multiple skin tears We will bandage these and have him wear support hose to try to get down the swelling and the weeping of the skin tears.  3. Chronic diastolic CHF (congestive heart failure) (HCC) Clinically stable.  4. Diabetes mellitus type 2, uncontrolled, with complications (HCC) On insulin and Arciga and Victoza.  5. Lumbar spondylosis On chronic pain medications.  Oxycodone at high dose.  Not increase dosage going forward  6. HYPERTENSION, BENIGN Controlled on HCTZ metoprolol and we need to look again on ARB.  7. Chronic bilateral low back pain with bilateral sciatica   8. OSA on CPAP Use CPAP nightly  9. Neuropathy Gabapentin and Lyrica  10. Primary osteoarthritis of both knees Status post knee replacement   Return in about 7 weeks (around 09/08/2021).      I, Jerry Durie, MD, have reviewed all documentation for this visit. The documentation on 08/07/21 for the exam, diagnosis, procedures, and orders are all accurate and complete.    Jarmaine Ehrler Cranford Mon, MD  [facility] [phone] (phone) [phone] (fax)  [place]

## 2021-07-24 ENCOUNTER — Other Ambulatory Visit: Payer: Self-pay | Admitting: Family Medicine

## 2021-07-24 DIAGNOSIS — E118 Type 2 diabetes mellitus with unspecified complications: Secondary | ICD-10-CM

## 2021-07-30 DIAGNOSIS — H2589 Other age-related cataract: Secondary | ICD-10-CM | POA: Diagnosis not present

## 2021-07-30 LAB — HM DIABETES EYE EXAM

## 2021-08-03 ENCOUNTER — Other Ambulatory Visit: Payer: Self-pay | Admitting: Family Medicine

## 2021-08-03 DIAGNOSIS — R110 Nausea: Secondary | ICD-10-CM

## 2021-08-03 DIAGNOSIS — M48061 Spinal stenosis, lumbar region without neurogenic claudication: Secondary | ICD-10-CM

## 2021-08-03 NOTE — Telephone Encounter (Signed)
Requested medication (s) are due for refill today:  yes   Requested medication (s) are on the active medication list:  yes   Last refill:  04/29/2021  Future visit scheduled: yes   Notes to clinic: this refill cannot be delegated    Requested Prescriptions  Pending Prescriptions Disp Refills   oxyCODONE (OXYCONTIN) 40 mg 12 hr tablet 270 tablet 0    Sig: Take 1 tablet (40 mg total) by mouth every 8 (eight) hours as needed.      Not Delegated - Analgesics:  Opioid Agonists Failed - 08/03/2021 12:36 PM      Failed - This refill cannot be delegated      Failed - Urine Drug Screen completed in last 360 days      Passed - Valid encounter within last 6 months    Recent Outpatient Visits           1 week ago Cellulitis of lower extremity, unspecified laterality   [facility] Jerrol Banana., MD   1 month ago Intercostal pain   [facility] Jerrol Ban[facility]., MD   3 months ago Diabetes mellitus type 2, uncontrolled, with complications [facility])   [facility] Jerrol Banana., MD   5 months ago Mixed simple and mucopurulent chronic bronchitis [facility])   [facility] Jerrol Banana., MD   7 months ago Diabetes mellitus type 2, uncontrolled, with complications [facility])   [facility] Jerrol Banana., MD       Future Appointments             In 1 month Jerrol Banana., MD [facility], [facility]

## 2021-08-03 NOTE — Telephone Encounter (Signed)
Copied from [place] [id number]. Topic: Quick Communication - Rx Refill/Question >> Aug 03, 2021 12:06 PM Tessa Lerner A wrote: Medication: oxyCODONE (OXYCONTIN) 40 mg 12 hr tablet   Has the patient contacted their pharmacy? No. (Agent: If no, request that the patient contact the pharmacy for the refill.) (Agent: If yes, when and what did the pharmacy advise?)  Preferred Pharmacy (with phone number or street name): [facility] [place] to [address] [place] [address]  Phone:  [phone] Fax:  [phone]  Agent: Please be advised that RX refills may take up to 3 business days. We ask that you follow-up with your pharmacy.

## 2021-08-03 NOTE — Telephone Encounter (Signed)
Requested medication (s) are due for refill today: Yes  Requested medication (s) are on the active medication list: Yes  Last refill:  05/18/20  Future visit scheduled: Yes  Notes to clinic:  Unable to refill per protocol, Rx expired.      Requested Prescriptions  Pending Prescriptions Disp Refills   magnesium oxide (MAG-OX) 400 MG tablet [Pharmacy Med Name: MAGNESIUM OXIDE 400 MG TABLET] 180 tablet 3    Sig: TAKE 1 TABLET (400 MG TOTAL) BY MOUTH 2 (TWO) TIMES DAILY.      Endocrinology:  Minerals - Magnesium Supplementation Failed - 08/03/2021 12:17 PM      Failed - Mg Level in normal range and within 360 days    Magnesium  Date Value Ref Range Status  04/24/2018 1.8 1.7 - 2.4 mg/dL Final    Comment:    Performed at [facility], [address], [place], [place] [address]          Passed - Valid encounter within last 12 months    Recent Outpatient Visits           1 week ago Cellulitis of lower extremity, unspecified laterality   [facility] Jerrol Ban[facility]., MD   1 month ago Intercostal pain   [facility] Jerrol Ban[facility]., MD   3 months ago Diabetes mellitus type 2, uncontrolled, with complications [facility])   [facility] Jerrol Banana., MD   5 months ago Mixed simple and mucopurulent chronic bronchitis [facility])   [facility] Jerrol Banana., MD   7 months ago Diabetes mellitus type 2, uncontrolled, with complications [facility])   [facility] Jerrol Banana., MD       Future Appointments             In 1 month Jerrol Banana., MD [facility], [facility]              Signed Prescriptions Disp Refills   rOPINIRole (REQUIP) 1 MG tablet 60 tablet 1    Sig: TAKE 1-2 TABLETS BY MOUTH AT BEDTIME AS NEEDED      Neurology:  Parkinsonian Agents Failed - 08/03/2021 12:17 PM      Failed - Last BP in normal range    BP Readings from Last 1  Encounters:  07/22/21 (!) 154/84          Passed - Valid encounter within last 12 months    Recent Outpatient Visits           1 week ago Cellulitis of lower extremity, unspecified laterality   [facility] Jerrol Banana., MD   1 month ago Intercostal pain   [facility]   3 months ago Diabetes mellitus type 2, uncontrolled, with complications [facility])   [facility] Jerrol Banana., MD   5 months ago Mixed simple and mucopurulent chronic bronchitis [facility])   [facility] Jerrol Banana., MD   7 months ago Diabetes mellitus type 2, uncontrolled, with complications [facility])   [facility] Jerrol Banana., MD       Future Appointments             In 1 month Jerrol Banana., MD [facility], [facility]               sucralfate (CARAFATE) 1 g tablet 360 tablet 1  Sig: TAKE 1 TABLET BY MOUTH 4 (FOUR) TIMES DAILY - WITH MEALS AND AT BEDTIME.      Gastroenterology: Antiacids Passed - 08/03/2021 12:17 PM      Passed - Valid encounter within last 12 months    Recent Outpatient Visits           1 week ago Cellulitis of lower extremity, unspecified laterality   [facility] Jerrol Ban[facility]., MD   1 month ago Intercostal pain   [facility] Jerrol Banana., MD   3 months ago Diabetes mellitus type 2, uncontrolled, with complications [facility])   [facility] Jerrol Banana., MD   5 months ago Mixed simple and mucopurulent chronic bronchitis [facility])   [facility] Jerrol Banana., MD   7 months ago Diabetes mellitus type 2, uncontrolled, with complications [facility])   [facility] Jerrol Banana., MD       Future Appointments             In 1 month Jerrol Banana., MD [facility], [facility]               hydrochlorothiazide  (HYDRODIURIL) 12.5 MG tablet 180 tablet 1    Sig: TAKE 2 TABLETS (25 MG TOTAL) BY MOUTH AT BEDTIME.      Cardiovascular: Diuretics - Thiazide Failed - 08/03/2021 12:17 PM      Failed - Ca in normal range and within 360 days    Calcium  Date Value Ref Range Status  09/09/2020 8.3 (L) 8.9 - 10.3 mg/dL Final   Calcium, Total  Date Value Ref Range Status  04/12/2013 8.8 8.5 - 10.1 mg/dL Final   Calcium, Ion  Date Value Ref Range Status  05/29/2020 6.0 (H) 4.5 - 5.6 mg/dL Final          Failed - Last BP in normal range    BP Readings from Last 1 Encounters:  07/22/21 (!) 154/84          Passed - Cr in normal range and within 360 days    Creat  Date Value Ref Range Status  12/13/2017 1.39 (H) 0.70 - 1.25 mg/dL Final    Comment:    For patients >24 years of age, the reference limit for Creatinine is approximately 13% higher for people identified as African-American. .    Creatinine, Ser  Date Value Ref Range Status  09/09/2020 1.18 0.61 - 1.24 mg/dL Final   Creatinine, Urine  Date Value Ref Range Status  06/24/2020 73 mg/dL Final          Passed - K in normal range and within 360 days    Potassium  Date Value Ref Range Status  09/09/2020 3.8 3.5 - 5.1 mmol/L Final  04/12/2013 4.0 3.5 - 5.1 mmol/L Final          Passed - Na in normal range and within 360 days    Sodium  Date Value Ref Range Status  09/09/2020 139 135 - 145 mmol/L Final  07/02/2020 143 134 - 144 mmol/L Final  04/12/2013 137 136 - 145 mmol/L Final          Passed - Valid encounter within last 6 months    Recent Outpatient Visits           1 week ago Cellulitis of lower extremity, unspecified laterality   [facility] Jerrol Ban[facility] MD   1 month ago Intercostal  pain   [facility] Jerrol Banana., MD   3 months ago Diabetes mellitus type 2, uncontrolled, with complications [facility])   [facility] Jerrol Banana., MD   5 months  ago Mixed simple and mucopurulent chronic bronchitis [facility])   [facility] Jerrol Banana., MD   7 months ago Diabetes mellitus type 2, uncontrolled, with complications [facility])   [facility] Jerrol Banana., MD       Future Appointments             In 1 month Jerrol Banana., MD [facility]

## 2021-08-05 MED ORDER — OXYCODONE HCL ER 40 MG PO T12A: 40.0000 mg | EXTENDED_RELEASE_TABLET | Freq: Three times a day (TID) | ORAL | 0 refills | Status: DC | PRN

## 2021-08-05 NOTE — Telephone Encounter (Signed)
[facility] [place] [facility] [address] [place] [address]  Phone: [phone] Fax: [phone]  Needs this sent to the pharmacy listed above, it is 2,000 dollars cheaper via [employer], please advise

## 2021-08-06 DIAGNOSIS — E291 Testicular hypofunction: Secondary | ICD-10-CM | POA: Diagnosis not present

## 2021-08-06 NOTE — Addendum Note (Signed)
Addended by: Julieta Bellini on: 08/06/2021 02:23 PM   Modules accepted: Orders

## 2021-08-10 MED ORDER — OXYCODONE HCL ER 40 MG PO T12A: 40.0000 mg | EXTENDED_RELEASE_TABLET | Freq: Three times a day (TID) | ORAL | 0 refills | Status: DC | PRN

## 2021-08-12 ENCOUNTER — Other Ambulatory Visit: Payer: Self-pay | Admitting: Family Medicine

## 2021-08-12 ENCOUNTER — Telehealth: Payer: Self-pay

## 2021-08-12 DIAGNOSIS — M5442 Lumbago with sciatica, left side: Secondary | ICD-10-CM

## 2021-08-12 DIAGNOSIS — G8929 Other chronic pain: Secondary | ICD-10-CM

## 2021-08-12 NOTE — Telephone Encounter (Signed)
Requested medication (s) are due for refill today: Yes  Requested medication (s) are on the active medication list: Yes  Last refill:  07/29/20  Future visit scheduled: Yes  Notes to clinic:  Unable to refill per protocol, cannot delegate.      Requested Prescriptions  Pending Prescriptions Disp Refills   cyclobenzaprine (FLEXERIL) 10 MG tablet [Pharmacy Med Name: CYCLOBENZAPRINE 10 MG TABLET] 90 tablet 12    Sig: TAKE 1 TABLET BY MOUTH 3 TIMES A DAY     Not Delegated - Analgesics:  Muscle Relaxants Failed - 08/12/2021 12:04 PM      Failed - This refill cannot be delegated      Passed - Valid encounter within last 6 months    Recent Outpatient Visits           3 weeks ago Cellulitis of lower extremity, unspecified laterality   [facility] Jerrol Ban[facility]   2 months ago Intercostal pain   [facility] Jerrol Ban[facility]., MD   3 months ago Diabetes mellitus type 2, uncontrolled, with complications [facility])   [facility] Jerrol Banana., MD   5 months ago Mixed simple and mucopurulent chronic bronchitis [facility])   [facility] Jerrol Banana., MD   8 months ago Diabetes mellitus type 2, uncontrolled, with complications [facility])   [facility] Jerrol Banana., MD       Future Appointments             In 3 weeks Jerrol Banana., MD [facility], [facility]

## 2021-08-12 NOTE — Telephone Encounter (Signed)
Requested medications are due for refill today.  yes  Requested medications are on the active medications list.  yes  Last refill. 07/13/2020  Future visit scheduled.   yes  Notes to clinic.  Prescription is expired.

## 2021-08-12 NOTE — Telephone Encounter (Signed)
Copied from [place] [id number]. Topic: General - Other >> Aug 12, 2021 11:16 AM Tessa Lerner A wrote: Reason for CRM: Patient's wife has made an additional phone call regarding the prior authorization needed for patient's prescription for oxyCODONE (OXYCONTIN) 40 mg 12 hr tablet   The patient and their wife have contacted their preferred pharmacy and been directed to contact their PCP  Please contact further when possible

## 2021-08-12 NOTE — Telephone Encounter (Signed)
I haven't seen a fax come through or see it in covermymeds either. I guess we can create a new one. Thanks!

## 2021-08-12 NOTE — Telephone Encounter (Signed)
Has the PA on this medicine been done or do I need to create a new one. Didn't find one on this patient through cover my meds.

## 2021-08-13 NOTE — Telephone Encounter (Signed)
Per CMA Ival Bible a form came yesterday to be fill out and to be fax.

## 2021-08-14 ENCOUNTER — Telehealth: Payer: Self-pay

## 2021-08-14 NOTE — Telephone Encounter (Signed)
Copied from [place] [id number]. Topic: General - Other >> Aug 12, 2021 11:16 AM Tessa Lerner A wrote: Reason for CRM: Patient's wife has made an additional phone call regarding the prior authorization needed for patient's prescription for oxyCODONE (OXYCONTIN) 40 mg 12 hr tablet   The patient and their wife have contacted their preferred pharmacy and been directed to contact their PCP  Please contact further when possible >> Aug 14, 2021 12:16 PM Leward Quan A wrote: Patient wife called in to inquire of Dr [name] and remind that the Prior authorization still need to be completed so that he can get his medication. Asking for a call back at Ph# [phone]

## 2021-08-17 NOTE — Telephone Encounter (Addendum)
PA form was faxed to [facility]. Waiting for reply. Rise Paganini was advised.

## 2021-08-25 ENCOUNTER — Telehealth: Payer: Self-pay | Admitting: Family Medicine

## 2021-08-25 NOTE — Telephone Encounter (Signed)
[facility] faxed refill request for the following medications:   NOVOTWIST PEN NEEDLE 32G X 5 MM MISC   Please advise.

## 2021-08-26 ENCOUNTER — Other Ambulatory Visit: Payer: Self-pay | Admitting: Cardiovascular Disease

## 2021-08-26 DIAGNOSIS — I480 Paroxysmal atrial fibrillation: Secondary | ICD-10-CM

## 2021-08-26 MED ORDER — NOVOTWIST PEN NEEDLE 32G X 5 MM MISC: 12 refills | Status: DC

## 2021-08-26 NOTE — Telephone Encounter (Signed)
Rx was sent to pharmacy. 

## 2021-08-27 NOTE — Telephone Encounter (Signed)
Prescription refill request for Eliquis received. Indication:AFIB Last office visit:GOLLAN 03/13/21 Scr:1.18 09/09/20 Age: 20M Weight:122.5KG

## 2021-09-01 ENCOUNTER — Other Ambulatory Visit: Payer: Self-pay | Admitting: Family Medicine

## 2021-09-01 DIAGNOSIS — IMO0002 Reserved for concepts with insufficient information to code with codable children: Secondary | ICD-10-CM

## 2021-09-01 DIAGNOSIS — M65332 Trigger finger, left middle finger: Secondary | ICD-10-CM | POA: Diagnosis not present

## 2021-09-01 DIAGNOSIS — G5602 Carpal tunnel syndrome, left upper limb: Secondary | ICD-10-CM | POA: Insufficient documentation

## 2021-09-01 DIAGNOSIS — E1165 Type 2 diabetes mellitus with hyperglycemia: Secondary | ICD-10-CM

## 2021-09-01 NOTE — Progress Notes (Signed)
Established patient visit   Patient: Jerry Parsons   DOB: April 06, 1951   70 y.o. Male  MRN: [id number] Visit Date: 09/02/2021  Today's healthcare provider: [name], MD   Chief Complaint  Patient presents with   Leg Pain    Subjective    Leg Pain  Patient reports that he has pain in both legs x 2 weeks. He reports that the pain radiates up to his knees. He denies any swelling. He reports that the pain comes and goes.  He has weightbearing knee pain.  He has chronic insomnia with trouble falling asleep and staying asleep.  Diabetes has been fairly well controlled with an A1c today of 7.3%.  He has no hypoglycemia.   BP Readings from Last 3 Encounters:  09/02/21 (!) 144/61  07/22/21 (!) 154/84  06/11/21 (!) 141/61   Wt Readings from Last 3 Encounters:  09/02/21 265 lb (120.2 kg)  07/22/21 270 lb (122.5 kg)  06/11/21 271 lb 3.2 oz (123 kg)       Medications: Outpatient Medications Prior to Visit  Medication Sig   amiodarone (PACERONE) 200 MG tablet Take 1 tablet (200 mg total) by mouth daily.   apixaban (ELIQUIS) 5 MG TABS tablet TAKE 1 TABLET TWICE A DAY   cyclobenzaprine (FLEXERIL) 10 MG tablet TAKE 1 TABLET BY MOUTH 3 TIMES A DAY   docusate sodium (COLACE) 100 MG capsule Take 300 mg by mouth daily.   dutasteride (AVODART) 0.5 MG capsule TAKE 1 CAPSULE DAILY   esomeprazole (NEXIUM) 40 MG capsule TAKE 1 CAPSULE TWICE DAILY   FARXIGA 10 MG TABS tablet TAKE 1 TABLET BY MOUTH EVERY DAY   ferrous sulfate 325 (65 FE) MG tablet Take by mouth.   fexofenadine (ALLEGRA) 180 MG tablet Take 180 mg by mouth daily.   furosemide (LASIX) 40 MG tablet TAKE 1 TABLET (40 MG) BY MOUTH ONCE DAILY, YOU MAY TAKE 1 EXTRA TABLET (40 MG) AFTER LUNCH AS NEEDED FOR LEG SWELLING/ ABDOMINAL TIGHTNESS   gabapentin (NEURONTIN) 100 MG capsule TAKE 1 CAPSULE BY MOUTH TWICE A DAY   hydrochlorothiazide (HYDRODIURIL) 12.5 MG tablet TAKE 2 TABLETS (25 MG TOTAL) BY MOUTH AT BEDTIME.    insulin aspart (NOVOLOG FLEXPEN) 100 UNIT/ML FlexPen Inject 14 Units into the skin in the morning and at bedtime.   Insulin Glargine (LANTUS SOLOSTAR [place]) Inject 40 Units into the skin 2 (two) times daily.   Insulin Pen Needle (NOVOTWIST PEN NEEDLE) 32G X 5 MM MISC USE 6 TIMES A DAY DX E11.9   Insulin Pen Needle (PEN NEEDLES) 31G X 5 MM MISC 1 each by Does not apply route daily. PATIENT NEEDS NOVA TWIST NEEDLES 5 MM  DX E11.9   JANUVIA 100 MG tablet TAKE 1 TABLET DAILY   liraglutide (VICTOZA) 18 MG/3ML SOPN Inject 1.8 mg into the skin daily.   magnesium oxide (MAG-OX) 400 MG tablet TAKE 1 TABLET (400 MG TOTAL) BY MOUTH 2 (TWO) TIMES DAILY.   MELATONIN PO Take 2 tablets by mouth at bedtime. Gummies   metoprolol tartrate (LOPRESSOR) 100 MG tablet TAKE 1 TABLET BY MOUTH TWICE A DAY   mupirocin cream (BACTROBAN) 2 % APPLY TO AFFECTED AREA TWICE A DAY   mupirocin ointment (BACTROBAN) 2 % Apply 1 application topically 2 (two) times daily as needed.   Naloxone HCl 0.4 MG/0.4ML SOAJ Inject as needed for suspected overdose.   Omega-3 Fatty Acids (FISH OIL PO) Take 1 capsule by mouth in the morning and  at bedtime.   Omega-3 Fatty Acids (FISH OIL) 1200 MG CAPS Take 1,200 mg by mouth 2 (two) times daily.    ONETOUCH ULTRA test strip USE WITH METER TWICE DAILY   oxyCODONE (OXYCONTIN) 40 mg 12 hr tablet Take 1 tablet (40 mg total) by mouth every 8 (eight) hours as needed.   pregabalin (LYRICA) 200 MG capsule TAKE 1 CAPSULE 3 TIMES A   DAY   rOPINIRole (REQUIP) 1 MG tablet TAKE 1-2 TABLETS BY MOUTH AT BEDTIME AS NEEDED   rOPINIRole (REQUIP) 2 MG tablet TAKE 1 TABLET BY MOUTH TWICE A DAY AS NEEDED   sucralfate (CARAFATE) 1 g tablet TAKE 1 TABLET BY MOUTH 4 (FOUR) TIMES DAILY - WITH MEALS AND AT BEDTIME.   tamsulosin (FLOMAX) 0.4 MG CAPS capsule Take 0.4 mg by mouth daily.   testosterone cypionate (DEPOTESTOSTERONE CYPIONATE) 200 MG/ML injection Inject 200 mg into the muscle every 21 ( twenty-one) days.    No  facility-administered medications prior to visit.    Review of Systems  Constitutional:  Negative for activity change and fatigue.  Cardiovascular:  Negative for leg swelling.  Musculoskeletal:  Positive for arthralgias. Negative for back pain.  Neurological:  Negative for dizziness and headaches.       Objective    BP (!) 144/61   Pulse 70   Temp 98.6 F (37 C)   Resp 16   Wt 265 lb (120.2 kg)   BMI 36.96 kg/m  BP Readings from Last 3 Encounters:  09/02/21 (!) 144/61  07/22/21 (!) 154/84  06/11/21 (!) 141/61   Wt Readings from Last 3 Encounters:  09/02/21 265 lb (120.2 kg)  07/22/21 270 lb (122.5 kg)  06/11/21 271 lb 3.2 oz (123 kg)      Physical Exam Vitals reviewed.  Constitutional:      Appearance: He is well-developed. He is obese.  HENT:     Head: Normocephalic and atraumatic.     Right Ear: External ear normal.     Left Ear: External ear normal.     Nose: Nose normal.  Eyes:     General: No scleral icterus.    Conjunctiva/sclera: Conjunctivae normal.  Neck:     Thyroid: No thyromegaly.  Cardiovascular:     Rate and Rhythm: Normal rate and regular rhythm.     Heart sounds: Normal heart sounds.  Pulmonary:     Effort: Pulmonary effort is normal.     Breath sounds: Normal breath sounds.  Abdominal:     Palpations: Abdomen is soft.  Musculoskeletal:     Right lower leg: Edema present.     Left lower leg: Edema present.     Comments: 1+ LE edema.    Skin:    General: Skin is warm and dry.  Neurological:     Mental Status: He is alert and oriented to person, place, and time. Mental status is at baseline.     Comments: Decreased sensation to monofilament in bilateral anterior feet. No nystagmus.  Gait is normal.  Psychiatric:        Mood and Affect: Mood normal.        Behavior: Behavior normal.        Thought Content: Thought content normal.        Judgment: Judgment normal.      No results found for any visits on 09/02/21.  Assessment & Plan      1. Diabetes mellitus type 2, uncontrolled, with complications (HCC) Good control with an A1c of  7.3.  Patient on Farxiga insulin with NovoLog and Lantus , Victoza, Januvia. Order continuous glucose monitor - POCT glycosylated hemoglobin (Hb A1C) - Continuous Blood Gluc Sensor MISC; 1 each by Does not apply route as directed. Use as directed every 14 days. May dispense FreeStyle [employer] or similar.  Dispense: 2 each; Refill: 5  2. HYPERTENSION, BENIGN Good control on hydrochlorothiazide Toprol all. Urine microalbumin on next visit.  3. Chronic diastolic CHF (congestive heart failure) (HCC) On Lasix  4. Typical atrial flutter ([place])   5. OSA on CPAP   6. Primary osteoarthritis of both knees   7. Lumbar spondylosis   8. Chronic bilateral low back pain with bilateral sciatica On chronic narcotics.  Would not increase the dose.  9. Mixed hyperlipidemia    No follow-ups on file.      I, [name], MD, have reviewed all documentation for this visit. The documentation on 09/05/21 for the exam, diagnosis, procedures, and orders are all accurate and complete.    Desirae Mancusi Cranford Mon, MD  [facility] [phone] (phone) [phone] (fax)  [place]

## 2021-09-01 NOTE — Telephone Encounter (Signed)
Requested Prescriptions  Pending Prescriptions Disp Refills  . JANUVIA 100 MG tablet [Pharmacy Med Name: JANUVIA TAB '100MG'$ ] 90 tablet 3    Sig: TAKE 1 TABLET DAILY     Endocrinology:  Diabetes - DPP-4 Inhibitors Passed - 09/01/2021  2:14 PM      Passed - HBA1C is between 0 and 7.9 and within 180 days    Hemoglobin A1C  Date Value Ref Range Status  05/04/2021 7.6 (A) 4.0 - 5.6 % Final   Hgb A1c MFr Bld  Date Value Ref Range Status  07/25/2020 7.6 (H) 4.8 - 5.6 % Final    Comment:    (NOTE)         Prediabetes: 5.7 - 6.4         Diabetes: >6.4         Glycemic control for adults with diabetes: <7.0          Passed - Cr in normal range and within 360 days    Creat  Date Value Ref Range Status  12/13/2017 1.39 (H) 0.70 - 1.25 mg/dL Final    Comment:    For patients >69 years of age, the reference limit for Creatinine is approximately 13% higher for people identified as African-American. .    Creatinine, Ser  Date Value Ref Range Status  09/09/2020 1.18 0.61 - 1.24 mg/dL Final   Creatinine, Urine  Date Value Ref Range Status  06/24/2020 73 mg/dL Final         Passed - Valid encounter within last 6 months    Recent Outpatient Visits          1 month ago Cellulitis of lower extremity, unspecified laterality   [facility] Jerrol Banana., MD   2 months ago Intercostal pain   [facility] Jerrol Banana., MD   4 months ago Diabetes mellitus type 2, uncontrolled, with complications [facility])   [facility] Jerrol Banana., MD   6 months ago Mixed simple and mucopurulent chronic bronchitis [facility])   [facility] Jerrol Banana., MD   8 months ago Diabetes mellitus type 2, uncontrolled, with complications [facility])   [facility] Jerrol Banana., MD      Future Appointments            Tomorrow Jerrol Banana., MD [facility] [place]   In 1 week Jerrol Banana., MD [facility]

## 2021-09-02 ENCOUNTER — Ambulatory Visit (INDEPENDENT_AMBULATORY_CARE_PROVIDER_SITE_OTHER): Payer: Medicare Other | Admitting: Family Medicine

## 2021-09-02 ENCOUNTER — Other Ambulatory Visit: Payer: Self-pay

## 2021-09-02 ENCOUNTER — Encounter: Payer: Self-pay | Admitting: Family Medicine

## 2021-09-02 VITALS — BP 144/61 | HR 70 | Temp 98.6°F | Resp 16 | Wt 265.0 lb

## 2021-09-02 DIAGNOSIS — E1165 Type 2 diabetes mellitus with hyperglycemia: Secondary | ICD-10-CM | POA: Diagnosis not present

## 2021-09-02 DIAGNOSIS — I483 Typical atrial flutter: Secondary | ICD-10-CM | POA: Diagnosis not present

## 2021-09-02 DIAGNOSIS — M5441 Lumbago with sciatica, right side: Secondary | ICD-10-CM

## 2021-09-02 DIAGNOSIS — I10 Essential (primary) hypertension: Secondary | ICD-10-CM

## 2021-09-02 DIAGNOSIS — E782 Mixed hyperlipidemia: Secondary | ICD-10-CM | POA: Diagnosis not present

## 2021-09-02 DIAGNOSIS — G4733 Obstructive sleep apnea (adult) (pediatric): Secondary | ICD-10-CM

## 2021-09-02 DIAGNOSIS — Z9989 Dependence on other enabling machines and devices: Secondary | ICD-10-CM

## 2021-09-02 DIAGNOSIS — M47816 Spondylosis without myelopathy or radiculopathy, lumbar region: Secondary | ICD-10-CM

## 2021-09-02 DIAGNOSIS — IMO0002 Reserved for concepts with insufficient information to code with codable children: Secondary | ICD-10-CM

## 2021-09-02 DIAGNOSIS — M170 Bilateral primary osteoarthritis of knee: Secondary | ICD-10-CM | POA: Diagnosis not present

## 2021-09-02 DIAGNOSIS — I5032 Chronic diastolic (congestive) heart failure: Secondary | ICD-10-CM | POA: Diagnosis not present

## 2021-09-02 DIAGNOSIS — E118 Type 2 diabetes mellitus with unspecified complications: Secondary | ICD-10-CM | POA: Diagnosis not present

## 2021-09-02 DIAGNOSIS — M5442 Lumbago with sciatica, left side: Secondary | ICD-10-CM | POA: Diagnosis not present

## 2021-09-02 DIAGNOSIS — G8929 Other chronic pain: Secondary | ICD-10-CM

## 2021-09-02 LAB — POCT GLYCOSYLATED HEMOGLOBIN (HGB A1C): Hemoglobin A1C: 7.3 % — AB (ref 4.0–5.6)

## 2021-09-02 MED ORDER — CONTINUOUS BLOOD GLUC SENSOR MISC: 1.0000 | 5 refills | Status: DC

## 2021-09-02 NOTE — Patient Instructions (Signed)
Try OTC Natural Calm tea at bedtime to help with sleep.

## 2021-09-05 ENCOUNTER — Other Ambulatory Visit: Payer: Self-pay | Admitting: Family Medicine

## 2021-09-06 ENCOUNTER — Other Ambulatory Visit: Payer: Self-pay | Admitting: Family Medicine

## 2021-09-06 NOTE — Telephone Encounter (Signed)
Last RF 08/03/21 #60 1 RF

## 2021-09-08 ENCOUNTER — Ambulatory Visit: Payer: Self-pay | Admitting: Family Medicine

## 2021-09-11 ENCOUNTER — Other Ambulatory Visit: Payer: Self-pay

## 2021-09-11 MED ORDER — PEN NEEDLES 31G X 5 MM MISC: 1.0000 | Freq: Every day | 3 refills | Status: DC

## 2021-09-16 DIAGNOSIS — E1142 Type 2 diabetes mellitus with diabetic polyneuropathy: Secondary | ICD-10-CM | POA: Diagnosis not present

## 2021-09-16 DIAGNOSIS — G5602 Carpal tunnel syndrome, left upper limb: Secondary | ICD-10-CM | POA: Diagnosis not present

## 2021-09-18 ENCOUNTER — Other Ambulatory Visit: Payer: Self-pay | Admitting: Family Medicine

## 2021-09-18 DIAGNOSIS — E291 Testicular hypofunction: Secondary | ICD-10-CM | POA: Diagnosis not present

## 2021-09-19 NOTE — Telephone Encounter (Signed)
Requested medications are due for refill today requesting early (mail order)  Requested medications are on the active medication list yes  Last refill 08/26/21 (mail order)  Last visit 09/02/21  Future visit scheduled 02/23/22  Notes to clinic Not Delegated.

## 2021-09-23 ENCOUNTER — Other Ambulatory Visit: Payer: Self-pay | Admitting: Cardiovascular Disease

## 2021-09-23 ENCOUNTER — Other Ambulatory Visit: Payer: Self-pay | Admitting: Family Medicine

## 2021-09-23 DIAGNOSIS — IMO0002 Reserved for concepts with insufficient information to code with codable children: Secondary | ICD-10-CM

## 2021-09-23 DIAGNOSIS — E1165 Type 2 diabetes mellitus with hyperglycemia: Secondary | ICD-10-CM

## 2021-09-24 ENCOUNTER — Ambulatory Visit (INDEPENDENT_AMBULATORY_CARE_PROVIDER_SITE_OTHER): Payer: Medicare Other | Admitting: Sports Medicine

## 2021-09-24 ENCOUNTER — Encounter: Payer: Self-pay | Admitting: Sports Medicine

## 2021-09-24 ENCOUNTER — Other Ambulatory Visit: Payer: Self-pay

## 2021-09-24 DIAGNOSIS — M79674 Pain in right toe(s): Secondary | ICD-10-CM

## 2021-09-24 DIAGNOSIS — B351 Tinea unguium: Secondary | ICD-10-CM

## 2021-09-24 DIAGNOSIS — M79675 Pain in left toe(s): Secondary | ICD-10-CM | POA: Diagnosis not present

## 2021-09-24 DIAGNOSIS — E1142 Type 2 diabetes mellitus with diabetic polyneuropathy: Secondary | ICD-10-CM

## 2021-09-24 DIAGNOSIS — I739 Peripheral vascular disease, unspecified: Secondary | ICD-10-CM

## 2021-09-24 NOTE — Progress Notes (Signed)
Subjective: Jerry Parsons is a 70 y.o. male patient with history of diabetes who presents to office today complaining of long,mildly painful nails while ambulating in shoes; unable to trim.  Patient states that he is doing good denies any changes in medications or new medical problems since last encounter. 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   Primary hyperparathyroidism ([place])    Loss of consciousness ([place]) 06/05/2020   Osteoarthritis of both knees 11/11/2018   OSA on CPAP 10/14/2018   MRSA bacteremia 05/19/2018   Lumbar spondylosis 05/05/2018   Spinal stenosis of lumbar region 05/05/2018   Constipation, chronic 03/08/2018   Pain in limb 03/03/2018   Gastroesophageal reflux disease without esophagitis 01/04/2018   OA (osteoarthritis) of knee 06/20/2017   Sepsis due to pneumonia ([place]) 05/07/2017   Neuropathy 10/13/2016   Amblyopia 12/30/2015   Cornea scar 12/30/2015   NS (nuclear sclerosis) 12/30/2015   Pseudoaphakia 12/30/2015   Dehydration 09/08/2015   Aspiration pneumonia ([place]) 07/04/2015   Paroxysmal atrial fibrillation ([place]) 07/04/2015   Arthritis of knee, degenerative 05/28/2015   Chronic back pain 09/17/2014   Leg edema 05/07/2014   Chronic diastolic CHF (congestive heart failure) ([place]) 05/07/2014   Campylobacter diarrhea 04/25/2013   Atrial flutter ([place]) 01/23/2013   Obesity 05/19/2012   Hyperlipidemia 35/36/1443   Diastolic dysfunction 15/40/0867   SOB (shortness of breath) 04/12/2011   Diabetes mellitus type 2, uncontrolled, with complications ([place]) 61/95/0932   HYPERTENSION, BENIGN 03/15/2011   DVT 03/15/2011   TACHYCARDIA 03/15/2011   Current Outpatient Medications on File Prior to Visit  Medication Sig Dispense Refill   amiodarone (PACERONE) 200 MG tablet TAKE 1 TABLET BY MOUTH EVERY DAY 90 tablet 2   apixaban (ELIQUIS) 5 MG TABS tablet TAKE 1 TABLET TWICE A DAY 180 tablet 1    BD PEN NEEDLE NANO 2ND GEN 32G X 4 MM MISC Please specify directions, refills and quantity 100 each 12   Continuous Blood Gluc Sensor MISC 1 each by Does not apply route as directed. Use as directed every 14 days. May dispense FreeStyle [employer] or similar. 2 each 5   cyclobenzaprine (FLEXERIL) 10 MG tablet TAKE 1 TABLET BY MOUTH 3 TIMES A DAY 90 tablet 12   docusate sodium (COLACE) 100 MG capsule Take 300 mg by mouth daily.     dutasteride (AVODART) 0.5 MG capsule TAKE 1 CAPSULE DAILY 90 capsule 3   esomeprazole (NEXIUM) 40 MG capsule TAKE 1 CAPSULE TWICE DAILY 180 capsule 0   FARXIGA 10 MG TABS tablet TAKE 1 TABLET BY MOUTH EVERY DAY 90 tablet 3   ferrous sulfate 325 (65 FE) MG tablet Take by mouth.     fexofenadine (ALLEGRA) 180 MG tablet Take 180 mg by mouth daily.     furosemide (LASIX) 40 MG tablet TAKE 1 TABLET (40 MG) BY MOUTH ONCE DAILY, YOU MAY TAKE 1 EXTRA TABLET (40 MG) AFTER LUNCH AS NEEDED FOR LEG SWELLING/ ABDOMINAL TIGHTNESS 180 tablet 2   gabapentin (NEURONTIN) 100 MG capsule TAKE 1 CAPSULE BY MOUTH TWICE A DAY 180 capsule 1   hydrochlorothiazide (HYDRODIURIL) 12.5 MG tablet TAKE 2 TABLETS (25 MG TOTAL) BY MOUTH AT BEDTIME. 180 tablet 1   insulin aspart (NOVOLOG FLEXPEN) 100 UNIT/ML FlexPen Inject 14 Units into the skin in the morning and at bedtime. 15 mL 11   Insulin Glargine (LANTUS SOLOSTAR [place]) Inject 40 Units into the skin 2 (two)  times daily.     Insulin Pen Needle (PEN NEEDLES) 31G X 5 MM MISC 1 each by Does not apply route daily. PATIENT NEEDS NOVA TWIST NEEDLES 5 MM  DX E11.9 200 each 3   JANUVIA 100 MG tablet TAKE 1 TABLET DAILY 90 tablet 3   liraglutide (VICTOZA) 18 MG/3ML SOPN Inject 1.8 mg into the skin daily. 27 mL 3   magnesium oxide (MAG-OX) 400 MG tablet TAKE 1 TABLET (400 MG TOTAL) BY MOUTH 2 (TWO) TIMES DAILY. 180 tablet 3   MELATONIN PO Take 2 tablets by mouth at bedtime. Gummies     metoprolol tartrate (LOPRESSOR) 100 MG tablet TAKE 1 TABLET BY  MOUTH TWICE A DAY 60 tablet 12   mupirocin cream (BACTROBAN) 2 % APPLY TO AFFECTED AREA TWICE A DAY 30 g 0   mupirocin ointment (BACTROBAN) 2 % Apply 1 application topically 2 (two) times daily as needed. 30 g 0   Naloxone HCl 0.4 MG/0.4ML SOAJ Inject as needed for suspected overdose. 0.4 mL 2   Omega-3 Fatty Acids (FISH OIL PO) Take 1 capsule by mouth in the morning and at bedtime.     Omega-3 Fatty Acids (FISH OIL) 1200 MG CAPS Take 1,200 mg by mouth 2 (two) times daily.      ONETOUCH ULTRA test strip USE WITH METER TWICE DAILY 100 strip 6   oxyCODONE (OXYCONTIN) 40 mg 12 hr tablet Take 1 tablet (40 mg total) by mouth every 8 (eight) hours as needed. 270 tablet 0   pregabalin (LYRICA) 200 MG capsule TAKE 1 CAPSULE 3 TIMES A   DAY 270 capsule 1   rOPINIRole (REQUIP) 1 MG tablet TAKE 1-2 TABLETS BY MOUTH AT BEDTIME AS NEEDED 60 tablet 1   rOPINIRole (REQUIP) 2 MG tablet TAKE 1 TABLET BY MOUTH TWICE A DAY AS NEEDED 180 tablet 2   sucralfate (CARAFATE) 1 g tablet TAKE 1 TABLET BY MOUTH 4 (FOUR) TIMES DAILY - WITH MEALS AND AT BEDTIME. 360 tablet 1   tamsulosin (FLOMAX) 0.4 MG CAPS capsule Take 0.4 mg by mouth daily.     testosterone cypionate (DEPOTESTOSTERONE CYPIONATE) 200 MG/ML injection Inject 200 mg into the muscle every 21 ( twenty-one) days.      No current facility-administered medications on file prior to visit.   Allergies  Allergen Reactions   Carisoprodol Itching   Tamsulosin     Pt stated, "upsets my stomach"    Objective: General: Patient is awake, alert, and oriented x 3 and in no acute distress.  Integument: Skin is warm, dry and supple bilateral. Nails are tender, long, thickened and dystrophic with subungual debris, consistent with onychomycosis, 1-5 bilateral. No open lesions or preulcerative lesions present bilateral. Remaining integument unremarkable. .  Vasculature:  Dorsalis Pedis pulse 0/4 bilateral. Posterior Tibial pulse  0/4 bilateral. Capillary fill time <3 sec  1-5 bilateral. No hair growth to the level of the digits.Temperature gradient within normal limits. No varicosities present bilateral. 1+ pitting edema present bilateral. +Stucco dermatitis due to PVD without infection.   Neurology: The patient has absent sensation measured with a 5.07/10g Semmes Weinstein Monofilament at all pedal sites bilateral, unchanged from prior  Musculoskeletal: Asymptomatic hammertoes pedal deformities noted bilateral R>L.  Muscular strength 5/5 in all lower extremity muscular groups bilateral without pain on range of motion. No tenderness with calf compression bilateral.  Assessment and Plan: Problem List Items Addressed This Visit   None Visit Diagnoses     Pain due to onychomycosis of toenails  of both feet    -  Primary   Diabetic polyneuropathy associated with type 2 diabetes mellitus ([place])       PAD (peripheral artery disease) ([place])          -Examined patient. -Re-Discussed and educated patient on diabetic foot care, especially with  regards to the vascular, neurological and musculoskeletal systems.  -Mechanically debrided all nails 1-5 bilateral using sterile nail nipper and filed with dremel without incident  -Advised good supportive shoes daily for foot type to accommodate hammertoes -Patient to return  in 2.5 to 3 months for at risk foot care or sooner if problems arise.  Landis Martins, DPM

## 2021-09-25 DIAGNOSIS — M65332 Trigger finger, left middle finger: Secondary | ICD-10-CM | POA: Diagnosis not present

## 2021-09-25 DIAGNOSIS — G5602 Carpal tunnel syndrome, left upper limb: Secondary | ICD-10-CM | POA: Diagnosis not present

## 2021-09-25 DIAGNOSIS — M65331 Trigger finger, right middle finger: Secondary | ICD-10-CM | POA: Diagnosis not present

## 2021-10-08 ENCOUNTER — Telehealth: Payer: Self-pay | Admitting: Cardiovascular Disease

## 2021-10-08 DIAGNOSIS — I700 Atherosclerosis of aorta: Secondary | ICD-10-CM

## 2021-10-08 NOTE — Telephone Encounter (Signed)
   [facility] Pre-operative Risk Assessment    Patient Name: Jerry Parsons  DOB: 1951-02-19 MRN: [id number]  HEARTCARE STAFF:  - IMPORTANT!!!!!! Under Visit Info/Reason for Call, type in Other and utilize the format Clearance MM/DD/YY or Clearance TBD. Do not use dashes or single digits. - Please review there is not already an duplicate clearance open for this procedure. - If request is for dental extraction, please clarify the # of teeth to be extracted. - If the patient is currently at the dentist's office, call Pre-Op Callback Staff (MA/nurse) to input urgent request.  - If the patient is not currently in the dentist office, please route to the Pre-Op pool.  Request for surgical clearance:  What type of surgery is being performed? Carpal tunnel release and trigger finger release   When is this surgery scheduled? 10/19/21  What type of clearance is required (medical clearance vs. Pharmacy clearance to hold med vs. Both)? both  Are there any medications that need to be held prior to surgery and how long? Eliquis and ASA directions   Practice name and name of physician performing surgery? [facility] - Dr Iran Planas  What is the office phone number? [phone]   7.   What is the office fax number? [phone]  8.   Anesthesia type (None, local, MAC, general) ? Not listed    Ace Gins 10/08/2021, 2:53 PM  _________________________________________________________________   (provider comments below)

## 2021-10-09 DIAGNOSIS — I700 Atherosclerosis of aorta: Secondary | ICD-10-CM | POA: Insufficient documentation

## 2021-10-09 NOTE — Telephone Encounter (Signed)
I s/w both the pt and his wife and both are agreeable to plan of care for an appt for the pt for pre op. Pt has been scheduled to see [place] 10/15/21 @ 10:30. Pt's wife asked if this appt would still leave enough time to hold Eliquis for the pt. See pharm-d recommendations for holding Eliquis.  I will forward the notes to [facility] for upcoming appt. Will send FYI to surgeon's office pt has appt 10/15/21.

## 2021-10-09 NOTE — Telephone Encounter (Signed)
Patient with diagnosis of afib on Eliquis for anticoagulation.    Procedure: carpal tunnel release and trigger finger release Date of procedure: 10/19/21  CHA2DS2-VASc Score = 7  This indicates a 11.2% annual risk of stroke. The patient's score is based upon: CHF History: 1 HTN History: 1 Diabetes History: 1 Stroke History: 2 Vascular Disease History: 1 Age Score: 1 Gender Score: 0   Also has hx of DVT and PE in 2011.  CrCl 55mL/min using adjusted body weight Platelet count 146K  Per office protocol, patient can hold Eliquis for 1 day prior to procedure. Resume as soon as safely possible after due to elevated CV risk off of anticoag.

## 2021-10-09 NOTE — Telephone Encounter (Signed)
Primary Cardiologist:Timothy Rockey Situ, MD  Chart reviewed as part of pre-operative protocol coverage. Because of Jerry Parsons's past medical history and time since last visit, he/she will require a follow-up visit in order to better assess preoperative cardiovascular risk.  Pre-op covering staff: - Please schedule appointment and call patient to inform them. - Please contact requesting surgeon's office via preferred method (i.e, phone, fax) to inform them of need for appointment prior to surgery.  If applicable, this message will also be routed to pharmacy pool and/or primary cardiologist for input on holding anticoagulant/antiplatelet agent as requested below so that this information is available at time of patient's appointment.   Deberah Pelton, NP  10/09/2021, 8:42 AM

## 2021-10-13 ENCOUNTER — Encounter: Payer: Self-pay | Admitting: General Surgery

## 2021-10-15 ENCOUNTER — Other Ambulatory Visit: Payer: Self-pay

## 2021-10-15 ENCOUNTER — Encounter: Payer: Self-pay | Admitting: Medical

## 2021-10-15 ENCOUNTER — Ambulatory Visit (INDEPENDENT_AMBULATORY_CARE_PROVIDER_SITE_OTHER): Payer: Medicare Other | Admitting: Medical

## 2021-10-15 VITALS — BP 138/60 | HR 65 | Ht 71.0 in | Wt 267.0 lb

## 2021-10-15 DIAGNOSIS — I480 Paroxysmal atrial fibrillation: Secondary | ICD-10-CM | POA: Diagnosis not present

## 2021-10-15 DIAGNOSIS — N183 Chronic kidney disease, stage 3 unspecified: Secondary | ICD-10-CM | POA: Diagnosis not present

## 2021-10-15 DIAGNOSIS — I5032 Chronic diastolic (congestive) heart failure: Secondary | ICD-10-CM | POA: Diagnosis not present

## 2021-10-15 DIAGNOSIS — Z01810 Encounter for preprocedural cardiovascular examination: Secondary | ICD-10-CM | POA: Diagnosis not present

## 2021-10-15 MED ORDER — FUROSEMIDE 40 MG PO TABS: 40.0000 mg | ORAL_TABLET | Freq: Every day | ORAL | 2 refills | Status: DC

## 2021-10-15 NOTE — Patient Instructions (Signed)
Medication Instructions:  Your physician has recommended you make the following change in your medication:   STOP Hydrochlorothiazide (HCTZ) START Furosemide (Lasix) 40 mg once daily    *If you need a refill on your cardiac medications before your next appointment, please call your pharmacy*   Lab Work: BMET in one week here in our office.  If you have labs (blood work) drawn today and your tests are completely normal, you will receive your results only by: Emily (if you have MyChart) OR A paper copy in the mail If you have any lab test that is abnormal or we need to change your treatment, we will call you to review the results.   Testing/Procedures: None   Follow-Up: At [facility], you and your health needs are our priority.  As part of our continuing mission to provide you with exceptional heart care, we have created designated Provider Care Teams.  These Care Teams include your primary Cardiologist (physician) and Advanced Practice Providers (APPs -  Physician Assistants and Nurse Practitioners) who all work together to provide you with the care you need, when you need it.    Your next appointment:   1 month(s)  The format for your next appointment:   In Person  Provider:   You may see Ida Rogue, MD or one of the following Advanced Practice Providers on your designated Care Team:   Murray Hodgkins, NP Christell Faith, PA-C Marrianne Mood, PA-C Cadence Olney, [place]

## 2021-10-15 NOTE — Progress Notes (Signed)
Cardiology Office Note:    Date:  10/15/2021   ID:  Jerry Parsons, DOB 11-May-1951, MRN [id number]  PCP:  Jerry Banana., MD  [facility] Cardiologist:  Jerry Rogue, MD  [place] Electrophysiologist:  None   Referring MD: Jerry Banana.,*   Chief Complaint: pre-op cardiac evaluation  History of Present Illness:    Jerry Parsons is a 70 y.o. 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 fib/flutter presents for preop cardiac evaluation.  Remote cardiac cath in the 1990s with "minimal blockage." Prior note indicates a history of atrial flutter with cardioversion in 02/2013. He has been maintained on oral amiodarone without OAC without documented recurrence of arrhythmia. Several hospitalizations over the years for pneumonia, Salmonella diarrhea, and bacteremia. Echo during admission in 03/2018 showed an EF of 55 to 29%, normal diastolic function, mild biatrial enlargement, no evidence of valvular vegetation. At his last follow-up in 06/2019 he denied any symptoms concerning for recurrence of palpitations and was continued on oral amiodarone without OAC.   He has been evaluated for hypercalcemia recently with subsequent diagnosis of parathyroid adenoma with plans for surgical resection in the near future.     He was admitted to [facility] from 6/10-6/11 after being found in a "low pressure state" in his car. Note indicates it was felt the patient did not have a good night sleep the night before and was exhausted, with him "probably" sleeping in his car. CT head showed no acute intracranial abnormality. MRI of the brain showed no acute intracranial abnormality. Echo showed an EF of 60-65%, mild LVH, no RWMA, moderately dilated left atrium,  normal RVSF, RV cavity size, normal PASP, and no significant valvular abnormalities. EKG and telemetry showed rate controlled atrial flutter. High-sensitivity troponin negative x2. He was started on Eliquis given a CHADS2VASc of at least 4 (CHF, HTN, age x1, vascular disease). We did not see him during this admission.   Last seen 02/2021 and was stable from a cardiac perspective.   Today, the patient presents for Cardiac clearnace for left carpal tunnel release and trigger finger release. He reports he has been doing well. No chest pain or shortness of beath.Intermittent LLE, he takes HCTZ at night. nOt on lasix. No orthopnea or pnd. EKG shows NSR. No bleeding issues with Eliquis. No palpitations, lightheadedness, or dizziness. Can walk 1-2 blocks, up a flight of stairs, yard work. Most recent A1 6.2.  No h/o stroke. Can hold Eliquis 1 day prior to procedure. Also on Aspirin, would stop this given no CAD history.   Past Medical History:  Diagnosis Date   Arrhythmia    tachycardia, A-Fib   Arthritis    Blind right eye    BPH (benign prostatic hyperplasia)    Diabetes mellitus    DVT (deep venous thrombosis) ([place]) 02/2010   leg thrombus ; dislodged into emboli and caused PE   Dyspnea    Dysrhythmia    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    x2   GERD (gastroesophageal reflux disease)    Headache    h/o as a child   Hypercholesteremia    Hypertension    Kidney failure    acute   Neuromuscular disorder (HCC)    Neuropathy    Pneumonia    time  9 ;last episode 12/2015   Pulmonary embolus ([place]) 2011   Seasonal allergies    Seizures ([place])    as child    Sleep apnea    BIPAP   Stiff neck    limited turning s/p titanium plate placement   TIA (transient ischemic attack)    Wears dentures    full upper and lower    Past Surgical History:  Procedure Laterality Date   BACK SURGERY     x 8; upper x 3 & lower x 5   CARDIOVERSION  03/14/13, 10/16   2014 - [place], 2016 - [place]    CARDIOVERSION N/A 09/11/2020   Procedure: CARDIOVERSION;  Surgeon: Minna Merritts, MD;  Location: [facility] ORS;  Service: Cardiovascular;  Laterality: N/A;   CATARACT EXTRACTION W/PHACO Left 10/29/2015   Procedure: CATARACT EXTRACTION PHACO AND INTRAOCULAR LENS PLACEMENT ([place]);  Surgeon: Leandrew Koyanagi, MD;  Location: [place];  Service: Ophthalmology;  Laterality: Left;  DIABETIC - insulin and oral medsSleep apnea - no machine   CHOLECYSTECTOMY     COLONOSCOPY WITH PROPOFOL N/A 01/16/2018   Procedure: COLONOSCOPY WITH PROPOFOL;  Surgeon: Manya Silvas, MD;  Location: [facility] ENDOSCOPY;  Service: Endoscopy;  Laterality: N/A;   ESOPHAGOGASTRODUODENOSCOPY (EGD) WITH PROPOFOL N/A 01/16/2018   Procedure: ESOPHAGOGASTRODUODENOSCOPY (EGD) WITH PROPOFOL;  Surgeon: Manya Silvas, MD;  Location: [facility] ENDOSCOPY;  Service: Endoscopy;  Laterality: N/A;   EYE SURGERY     GALLBLADDER SURGERY  2002   JOINT REPLACEMENT Right 2018   KNEE ARTHROSCOPY     left    PARATHYROIDECTOMY N/A 07/25/2020   Procedure: PARATHYROIDECTOMY;  Surgeon: Fredirick Maudlin, MD;  Location: [facility] ORS;  Service: General;  Laterality: N/A;   ROTATOR CUFF REPAIR  2001   left    TEE WITHOUT CARDIOVERSION N/A 04/26/2018   Procedure: TRANSESOPHAGEAL ECHOCARDIOGRAM (TEE);  Surgeon: Nelva Bush, MD;  Location: [facility] ORS;  Service: Cardiovascular;  Laterality: N/A;   TONSILLECTOMY     TOTAL KNEE ARTHROPLASTY Right 06/20/2017   Procedure: RIGHT TOTAL KNEE ARTHROPLASTY;  Surgeon: Gaynelle Arabian, MD;  Location: [facility] ORS;  Service: Orthopedics;  Laterality: Right;    Current Medications: Current Meds  Medication Sig   amiodarone (PACERONE) 200 MG tablet TAKE 1 TABLET BY MOUTH EVERY DAY   apixaban (ELIQUIS) 5 MG TABS tablet TAKE 1 TABLET TWICE A DAY   BD PEN NEEDLE NANO 2ND GEN 32G X 4 MM MISC Please specify directions, refills and quantity   Continuous Blood Gluc Sensor MISC 1 each by Does not apply route as directed. Use as  directed every 14 days. May dispense FreeStyle [employer] or similar.   cyclobenzaprine (FLEXERIL) 10 MG tablet TAKE 1 TABLET BY MOUTH 3 TIMES A DAY   dutasteride (AVODART) 0.5 MG capsule TAKE 1 CAPSULE DAILY   esomeprazole (NEXIUM) 40 MG capsule TAKE 1 CAPSULE TWICE DAILY   FARXIGA 10 MG TABS tablet TAKE 1 TABLET BY MOUTH EVERY DAY   ferrous sulfate 325 (65 FE) MG tablet Take 1 tablet by mouth daily.   fexofenadine (ALLEGRA) 180 MG tablet Take 180 mg by mouth daily.   furosemide (LASIX) 40 MG tablet Take 1 tablet (40 mg total) by mouth daily.   gabapentin (NEURONTIN) 100 MG capsule TAKE 1 CAPSULE BY MOUTH TWICE A DAY   gabapentin (NEURONTIN) 300 MG capsule Take 300 mg by mouth at bedtime.   insulin aspart (NOVOLOG FLEXPEN) 100 UNIT/ML FlexPen Inject 14 Units into the skin in the morning and at  bedtime.   Insulin Glargine (LANTUS SOLOSTAR [place]) Inject 40 Units into the skin 2 (two) times daily.   Insulin Pen Needle (PEN NEEDLES) 31G X 5 MM MISC 1 each by Does not apply route daily. PATIENT NEEDS NOVA TWIST NEEDLES 5 MM  DX E11.9   JANUVIA 100 MG tablet TAKE 1 TABLET DAILY   liraglutide (VICTOZA) 18 MG/3ML SOPN Inject 1.8 mg into the skin daily.   magnesium oxide (MAG-OX) 400 MG tablet TAKE 1 TABLET (400 MG TOTAL) BY MOUTH 2 (TWO) TIMES DAILY.   metoprolol tartrate (LOPRESSOR) 100 MG tablet TAKE 1 TABLET BY MOUTH TWICE A DAY   mupirocin cream (BACTROBAN) 2 % APPLY TO AFFECTED AREA TWICE A DAY   mupirocin ointment (BACTROBAN) 2 % Apply 1 application topically 2 (two) times daily as needed.   Naloxone HCl 0.4 MG/0.4ML SOAJ Inject as needed for suspected overdose.   Omega-3 Fatty Acids (FISH OIL) 1200 MG CAPS Take 1,200 mg by mouth 2 (two) times daily.    ONETOUCH ULTRA test strip USE WITH METER TWICE DAILY   oxyCODONE (OXYCONTIN) 40 mg 12 hr tablet Take 1 tablet (40 mg total) by mouth every 8 (eight) hours as needed.   POTASSIUM PO Take by mouth daily.   pregabalin (LYRICA) 200 MG capsule  TAKE 1 CAPSULE 3 TIMES A   DAY   rOPINIRole (REQUIP) 1 MG tablet TAKE 1-2 TABLETS BY MOUTH AT BEDTIME AS NEEDED   rOPINIRole (REQUIP) 2 MG tablet TAKE 1 TABLET BY MOUTH TWICE A DAY AS NEEDED   sucralfate (CARAFATE) 1 g tablet TAKE 1 TABLET BY MOUTH 4 (FOUR) TIMES DAILY - WITH MEALS AND AT BEDTIME.   tamsulosin (FLOMAX) 0.4 MG CAPS capsule Take 0.4 mg by mouth daily.   testosterone cypionate (DEPOTESTOSTERONE CYPIONATE) 200 MG/ML injection Inject 200 mg into the muscle every 21 ( twenty-one) days.    VITAMIN D PO Take by mouth daily at 2 am.   [DISCONTINUED] hydrochlorothiazide (HYDRODIURIL) 12.5 MG tablet TAKE 2 TABLETS (25 MG TOTAL) BY MOUTH AT BEDTIME.     Allergies:   Carisoprodol and Tamsulosin   Social History   Socioeconomic History   Marital status: Married    Spouse name: Jerry Parsons    Number of children: 1   Years of education: Not on file   Highest education level: 6th grade  Occupational History   Occupation: retired  Tobacco Use   Smoking status: Former    Packs/day: 2.00    Years: 20.00    Pack years: 40.00    Types: Cigarettes    Quit date: 12/26/1989    Years since quitting: 31.8   Smokeless tobacco: Never  Vaping Use   Vaping Use: Never used  Substance and Sexual Activity   Alcohol use: No   Drug use: No   Sexual activity: Not on file  Other Topics Concern   Not on file  Social History Narrative   Lives at home with wife    Social Determinants of Health   Financial Resource Strain: Not on file  Food Insecurity: Not on file  Transportation Needs: Not on file  Physical Activity: Not on file  Stress: Not on file  Social Connections: Not on file     Family History: The patient's family history includes Heart attack in his brother.  ROS:   Please see the history of present illness.     All other systems reviewed and are negative.  EKGs/Labs/Other Studies Reviewed:    The following studies were  reviewed today:  Echo 05/2020 1. Left ventricular  ejection fraction, by estimation, is 60 to 65%. The  left ventricle has normal function. The left ventricle has no regional  wall motion abnormalities. There is mild left ventricular hypertrophy.  Left ventricular diastolic parameters  are indeterminate.   2. Right ventricular systolic function is normal. The right ventricular  size is normal. There is normal pulmonary artery systolic pressure.   3. Left atrial size was moderately dilated.   4. The aortic valve was not well visualized. Aortic valve regurgitation  is not visualized. No aortic stenosis is present.   Heart monitor 06/2020 Study Highlights  Event monitor    Atrial Flutter occurred continuously (100% burden), ranging from 51-139 bpm (avg of 80 bpm).    Isolated VEs were rare (<1.0%), VE Couplets were rare (<1.0%), and no VE Triplets were present.   Patient triggered events were not associated with significant arrhythmia   Signed, Esmond Plants, MD, Ph.D [facility]  EKG:  EKG is  ordered today.  The ekg ordered today demonstrates NSR, 65bpm, IVCD, nonspecific T wave changes  Recent Labs: No results found for requested labs within last 8760 hours.  Recent Lipid Panel    Component Value Date/Time   CHOL 134 05/15/2020 1023   CHOL 93 04/03/2013 0209   TRIG 233 (H) 05/15/2020 1023   TRIG 163 04/03/2013 0209   HDL 29 (L) 05/15/2020 1023   HDL 25 (L) 04/03/2013 0209   CHOLHDL 4.6 05/15/2020 1023   CHOLHDL 6.6 09/09/2015 0501   VLDL UNABLE TO CALCULATE IF TRIGLYCERIDE OVER 400 mg/dL 09/09/2015 0501   VLDL 33 04/03/2013 0209   LDLCALC 67 05/15/2020 1023   LDLCALC 35 04/03/2013 0209     Physical Exam:    VS:  BP 138/60 (BP Location: Left Arm, Patient Position: Sitting, Cuff Size: Large)   Pulse 65   Ht 5\' 11"  (1.803 m)   Wt 267 lb (121.1 kg)   SpO2 91%   BMI 37.24 kg/m     Wt Readings from Last 3 Encounters:  10/15/21 267 lb (121.1 kg)  09/02/21 265 lb (120.2 kg)  07/22/21 270 lb (122.5 kg)     GEN:   Well nourished, well developed in no acute distress HEENT: Normal NECK: No JVD; No carotid bruits LYMPHATICS: No lymphadenopathy CARDIAC: RRR, no murmurs, rubs, gallops RESPIRATORY:  Clear to auscultation without rales, wheezing or rhonchi  ABDOMEN: Soft, non-tender, non-distended MUSCULOSKELETAL:  1+ lower leg edema; No deformity  SKIN: Warm and dry NEUROLOGIC:  Alert and oriented x 3 PSYCHIATRIC:  Normal affect   ASSESSMENT:    1. Preoperative cardiovascular examination   2. Chronic diastolic CHF (congestive heart failure) (HCC)   3. Stage 3 chronic kidney disease, unspecified whether stage 3a or 3b CKD (HCC)   4. Paroxysmal atrial fibrillation (HCC)    PLAN:    In order of problems listed above:  Preoperative cardiac evaluation for trigger finger/carpal tunnel procedure Surgery scheduled for  Monday. Patient denies chest pain or shortness of breath. He has 1+ lower leg edema on exam, on HCTZ 25mg  at night and [place]. Previously on lasix. Most recent echo 2021 showed LVEF 60-65%, no WMA, indeterminate diastolic parameters, moderately dialted LA. EKG today shows SR with no ischemic changes. He is on Eliquis and amiodarone for afib/flutter. PCP started him on ASA, although had no h/o CAD/ACS and is on Eliquis for afib/flutter. I would recommend stopping ASA altogether, although can defer to PCP. For LLE I  will stop HCTZ and start him on previous dose of lasix 40mg  daily, BMET in a week. CHF education discussed. Denies palpitations, orthopnea, pnd. He is functional at baseline with METS>4. According to the Revised Cardiac Risk Index Assessment he is Class 3 risk for 10/1% 30 day risk of death, MI or cardiac arrest. According to pharmacy recommendations, OK to hold Eliquis 1 day prior to procedure.  It is ok to proceed with surgery without prior cardiac work-up. We will see him back in a month to evaluate volume status.   Disposition: Follow up in 1 month(s) with Md/APP   Signed, Sibbie Flammia  Ninfa Meeker, PA-C  10/15/2021 10:49 AM    [facility]

## 2021-10-16 ENCOUNTER — Other Ambulatory Visit: Payer: Self-pay | Admitting: Family Medicine

## 2021-10-17 ENCOUNTER — Other Ambulatory Visit: Payer: Self-pay | Admitting: Family Medicine

## 2021-10-17 NOTE — Telephone Encounter (Signed)
Requested medications are due for refill today yes  Requested medications are on the active medication list yes, there are two rx for same med, different dose, please assess  Last refill 09/23/21  Last visit 09/02/21  Future visit scheduled 02/23/21  Notes to clinic please assess, there are two rx for this med. Requested Prescriptions  Pending Prescriptions Disp Refills   rOPINIRole (REQUIP) 1 MG tablet [Pharmacy Med Name: ROPINIROLE HCL 1 MG TABLET] 60 tablet 4    Sig: TAKE 1 TO 2 TABLETS BY MOUTH AT BEDTIME AS NEEDED     Neurology:  Parkinsonian Agents Passed - 10/16/2021 10:33 AM      Passed - Last BP in normal range    BP Readings from Last 1 Encounters:  10/15/21 138/60          Passed - Valid encounter within last 12 months    Recent Outpatient Visits           1 month ago Diabetes mellitus type 2, uncontrolled, with complications [facility])   [facility] Jerrol Banana., MD   2 months ago Cellulitis of lower extremity, unspecified laterality   [facility] Jerrol Banana., MD   4 months ago Intercostal pain   [facility] Jerrol Banana., MD   5 months ago Diabetes mellitus type 2, uncontrolled, with complications [facility])   [facility] Jerrol Banana., MD   7 months ago Mixed simple and mucopurulent chronic bronchitis [facility])   [facility] Jerrol Banana., MD       Future Appointments             In 3 weeks Gollan, Kathlene November, MD [facility], [facility]

## 2021-10-18 NOTE — Telephone Encounter (Signed)
Called [facility] and spoke to [place]. Was advised that pt called [facility] for refills.   Based on chart review (09/02/21) 1. Diabetes mellitus type 2, uncontrolled, with complications ([place]) Good control with an A1c of 7.3.  Patient on Farxiga insulin with NovoLog and Lantus , Victoza, Januvia  Last filled 02/24/21 and dc'd 03/13/21. Please review.

## 2021-10-19 DIAGNOSIS — G5602 Carpal tunnel syndrome, left upper limb: Secondary | ICD-10-CM | POA: Diagnosis not present

## 2021-10-19 DIAGNOSIS — M65332 Trigger finger, left middle finger: Secondary | ICD-10-CM | POA: Diagnosis not present

## 2021-10-22 ENCOUNTER — Other Ambulatory Visit: Payer: Self-pay

## 2021-10-22 ENCOUNTER — Other Ambulatory Visit (INDEPENDENT_AMBULATORY_CARE_PROVIDER_SITE_OTHER): Payer: Medicare Other

## 2021-10-22 DIAGNOSIS — I5032 Chronic diastolic (congestive) heart failure: Secondary | ICD-10-CM | POA: Diagnosis not present

## 2021-10-22 DIAGNOSIS — N183 Chronic kidney disease, stage 3 unspecified: Secondary | ICD-10-CM

## 2021-10-22 DIAGNOSIS — I480 Paroxysmal atrial fibrillation: Secondary | ICD-10-CM | POA: Diagnosis not present

## 2021-10-23 LAB — BASIC METABOLIC PANEL
BUN/Creatinine Ratio: 19 (ref 10–24)
BUN: 24 mg/dL (ref 8–27)
CO2: 30 mmol/L — ABNORMAL HIGH (ref 20–29)
Calcium: 9.1 mg/dL (ref 8.6–10.2)
Chloride: 98 mmol/L (ref 96–106)
Creatinine, Ser: 1.28 mg/dL — ABNORMAL HIGH (ref 0.76–1.27)
Glucose: 116 mg/dL — ABNORMAL HIGH (ref 70–99)
Potassium: 4.1 mmol/L (ref 3.5–5.2)
Sodium: 142 mmol/L (ref 134–144)
eGFR: 60 mL/min/{1.73_m2} (ref >59)

## 2021-10-26 ENCOUNTER — Telehealth: Payer: Self-pay | Admitting: *Deleted

## 2021-10-26 NOTE — Telephone Encounter (Signed)
-----   Message from Adell, PA-C sent at 10/26/2021  9:25 AM EDT ----- Labs show relatively stable kidney function. Please ask if this is controlling volume status.

## 2021-10-26 NOTE — Telephone Encounter (Signed)
Called and spoke with Rise Paganini, pt's wife, (DPR approved).  Notified of lab results.  Rise Paganini confirms that pt's swelling has improved.

## 2021-11-01 ENCOUNTER — Other Ambulatory Visit: Payer: Self-pay | Admitting: Family Medicine

## 2021-11-01 ENCOUNTER — Other Ambulatory Visit: Payer: Self-pay | Admitting: Medical

## 2021-11-01 NOTE — Telephone Encounter (Signed)
[facility] and spoke with Merleen Nicely who is a Software engineer. The new prescription was received and she said that it is ready to pick up. 10/19/21 #[phone] RF. Refused duplicate request.

## 2021-11-04 ENCOUNTER — Other Ambulatory Visit: Payer: Self-pay | Admitting: Family Medicine

## 2021-11-04 DIAGNOSIS — M48061 Spinal stenosis, lumbar region without neurogenic claudication: Secondary | ICD-10-CM

## 2021-11-04 MED ORDER — OXYCODONE HCL ER 40 MG PO T12A: 40.0000 mg | EXTENDED_RELEASE_TABLET | Freq: Three times a day (TID) | ORAL | 0 refills | Status: DC | PRN

## 2021-11-04 NOTE — Telephone Encounter (Signed)
Medication Refill - Medication:oxyCODONE (OXYCONTIN) 40 mg 12 hr tablet  Has the patient contacted their pharmacy? yes (Agent: If no, request that the patient contact the pharmacy for the refill. If patient does not wish to contact the pharmacy document the reason why and proceed with request.) (Agent: If yes, when and what did the pharmacy advise?)contact pcp  Preferred Pharmacy (with phone number or street name): [facility] [place] [place] [facility] Phone:  [phone]  Fax:  [phone]     Has the patient been seen for an appointment in the last year OR does the patient have an upcoming appointment? yes  Agent: Please be advised that RX refills may take up to 3 business days. We ask that you follow-up with your pharmacy.

## 2021-11-04 NOTE — Telephone Encounter (Signed)
Requested medication (s) are due for refill today: No  Requested medication (s) are on the active medication list: Yes  Last refill:   270 tablet 0 08/10/2021   Future visit scheduled: Yes  Notes to clinic:  Unable to refill per protocol, cannot delegate.      Requested Prescriptions  Pending Prescriptions Disp Refills   oxyCODONE (OXYCONTIN) 40 mg 12 hr tablet 270 tablet 0    Sig: Take 1 tablet (40 mg total) by mouth every 8 (eight) hours as needed.     Not Delegated - Analgesics:  Opioid Agonists Failed - 11/04/2021  1:25 PM      Failed - This refill cannot be delegated      Failed - Urine Drug Screen completed in last 360 days      Passed - Valid encounter within last 6 months    Recent Outpatient Visits           2 months ago Diabetes mellitus type 2, uncontrolled, with complications [facility])   [facility] Jerrol Banana., MD   3 months ago Cellulitis of lower extremity, unspecified laterality   [facility] Jerrol Banana., MD   4 months ago Intercostal pain   [facility] Jerrol Banana., MD   6 months ago Diabetes mellitus type 2, uncontrolled, with complications [facility])   [facility] Jerrol Banana., MD   8 months ago Mixed simple and mucopurulent chronic bronchitis [facility])   [facility] Jerrol Banana., MD       Future Appointments             Tomorrow Jerrol Banana., MD [facility], [place]   In 1 week Gollan, Kathlene November, MD [facility], [place]

## 2021-11-05 ENCOUNTER — Other Ambulatory Visit: Payer: Self-pay | Admitting: *Deleted

## 2021-11-05 ENCOUNTER — Ambulatory Visit (INDEPENDENT_AMBULATORY_CARE_PROVIDER_SITE_OTHER): Payer: Medicare Other | Admitting: Family Medicine

## 2021-11-05 ENCOUNTER — Encounter: Payer: Self-pay | Admitting: Family Medicine

## 2021-11-05 ENCOUNTER — Other Ambulatory Visit: Payer: Self-pay

## 2021-11-05 VITALS — BP 146/65 | HR 71 | Temp 98.0°F | Resp 18 | Wt 265.0 lb

## 2021-11-05 DIAGNOSIS — M170 Bilateral primary osteoarthritis of knee: Secondary | ICD-10-CM

## 2021-11-05 DIAGNOSIS — E892 Postprocedural hypoparathyroidism: Secondary | ICD-10-CM | POA: Diagnosis not present

## 2021-11-05 DIAGNOSIS — G8929 Other chronic pain: Secondary | ICD-10-CM

## 2021-11-05 DIAGNOSIS — G4733 Obstructive sleep apnea (adult) (pediatric): Secondary | ICD-10-CM | POA: Diagnosis not present

## 2021-11-05 DIAGNOSIS — I480 Paroxysmal atrial fibrillation: Secondary | ICD-10-CM | POA: Diagnosis not present

## 2021-11-05 DIAGNOSIS — Z9989 Dependence on other enabling machines and devices: Secondary | ICD-10-CM | POA: Diagnosis not present

## 2021-11-05 DIAGNOSIS — Z23 Encounter for immunization: Secondary | ICD-10-CM | POA: Diagnosis not present

## 2021-11-05 DIAGNOSIS — M48061 Spinal stenosis, lumbar region without neurogenic claudication: Secondary | ICD-10-CM | POA: Diagnosis not present

## 2021-11-05 DIAGNOSIS — M25512 Pain in left shoulder: Secondary | ICD-10-CM | POA: Diagnosis not present

## 2021-11-05 DIAGNOSIS — I700 Atherosclerosis of aorta: Secondary | ICD-10-CM

## 2021-11-05 DIAGNOSIS — I5032 Chronic diastolic (congestive) heart failure: Secondary | ICD-10-CM | POA: Diagnosis not present

## 2021-11-05 DIAGNOSIS — E1121 Type 2 diabetes mellitus with diabetic nephropathy: Secondary | ICD-10-CM | POA: Diagnosis not present

## 2021-11-05 DIAGNOSIS — Z20822 Contact with and (suspected) exposure to covid-19: Secondary | ICD-10-CM | POA: Diagnosis not present

## 2021-11-05 MED ORDER — PREDNISONE 20 MG PO TABS: 20.0000 mg | ORAL_TABLET | Freq: Every day | ORAL | 0 refills | Status: DC

## 2021-11-05 NOTE — Progress Notes (Signed)
I,[name],acting as a scribe for [name], MD.,have documented all relevant documentation on the behalf of [name], MD,as directed by  [name], MD while in the presence of [name], MD.   Established patient visit   Patient: Jerry Parsons   DOB: 10-Jul-1951   70 y.o. Male  MRN: [id number] Visit Date: 11/05/2021  Today's healthcare provider: [name], MD   Chief Complaint  Patient presents with   Shoulder Pain   Subjective    Shoulder Pain  The pain is present in the left shoulder and left arm. This is a chronic problem. The current episode started more than 1 year ago (years). The problem occurs constantly. The quality of the pain is described as sharp. The pain is at a severity of 9/10. The pain is severe. Associated symptoms include a limited range of motion, numbness, stiffness and tingling. Pertinent negatives include no fever, inability to bear weight, itching, joint locking or joint swelling. The symptoms are aggravated by activity. He has tried heat (patient is already taking pain medications) for the symptoms. The treatment provided no relief.   He denies any known injury.  He did have shoulder surgery about 15 years ago.  He does request a prescription for a Dexcom continuous glucose monitor for his diabetes.  I think this is entirely appropriate in this patient who is on diabetes and has at times poorly controlled diabetes  Patient is here concerning left shoulder pain. Patient states he had surgery on his left shoulders years ago. Now shoulder pain has flared back up. Patient describes pain as constant and severe. Patient is already taking pain medication. He is also using a heating pad with no relief.     Medications: Outpatient Medications Prior to Visit  Medication Sig   amiodarone (PACERONE) 200 MG tablet TAKE 1 TABLET BY MOUTH EVERY DAY   apixaban (ELIQUIS) 5 MG TABS tablet TAKE 1 TABLET TWICE A DAY   BD PEN  NEEDLE NANO 2ND GEN 32G X 4 MM MISC Please specify directions, refills and quantity   Continuous Blood Gluc Sensor MISC 1 each by Does not apply route as directed. Use as directed every 14 days. May dispense FreeStyle [employer] or similar.   cyclobenzaprine (FLEXERIL) 10 MG tablet TAKE 1 TABLET BY MOUTH 3 TIMES A DAY   dutasteride (AVODART) 0.5 MG capsule TAKE 1 CAPSULE DAILY   esomeprazole (NEXIUM) 40 MG capsule TAKE 1 CAPSULE TWICE DAILY   FARXIGA 10 MG TABS tablet TAKE 1 TABLET BY MOUTH EVERY DAY   ferrous sulfate 325 (65 FE) MG tablet Take 1 tablet by mouth daily.   fexofenadine (ALLEGRA) 180 MG tablet Take 180 mg by mouth daily.   furosemide (LASIX) 40 MG tablet TAKE 1 TABLET BY MOUTH EVERY DAY   gabapentin (NEURONTIN) 100 MG capsule TAKE 1 CAPSULE BY MOUTH TWICE A DAY   gabapentin (NEURONTIN) 300 MG capsule Take 300 mg by mouth at bedtime.   insulin aspart (NOVOLOG FLEXPEN) 100 UNIT/ML FlexPen Inject 14 Units into the skin in the morning and at bedtime.   Insulin Glargine (LANTUS SOLOSTAR [place]) Inject 40 Units into the skin 2 (two) times daily.   Insulin Pen Needle (PEN NEEDLES) 31G X 5 MM MISC 1 each by Does not apply route daily. PATIENT NEEDS NOVA TWIST NEEDLES 5 MM  DX E11.9   JANUVIA 100 MG tablet TAKE 1 TABLET DAILY   LANTUS SOLOSTAR 100 UNIT/ML Solostar  Pen Inject 40 Units into the skin 2 (two) times daily.   liraglutide (VICTOZA) 18 MG/3ML SOPN Inject 1.8 mg into the skin daily.   magnesium oxide (MAG-OX) 400 MG tablet TAKE 1 TABLET (400 MG TOTAL) BY MOUTH 2 (TWO) TIMES DAILY.   metoprolol tartrate (LOPRESSOR) 100 MG tablet TAKE 1 TABLET BY MOUTH TWICE A DAY   mupirocin cream (BACTROBAN) 2 % APPLY TO AFFECTED AREA TWICE A DAY   mupirocin ointment (BACTROBAN) 2 % Apply 1 application topically 2 (two) times daily as needed.   Naloxone HCl 0.4 MG/0.4ML SOAJ Inject as needed for suspected overdose.   Omega-3 Fatty Acids (FISH OIL) 1200 MG CAPS Take 1,200 mg by mouth 2 (two)  times daily.    ONETOUCH ULTRA test strip USE WITH METER TWICE DAILY   oxyCODONE (OXYCONTIN) 40 mg 12 hr tablet Take 1 tablet (40 mg total) by mouth every 8 (eight) hours as needed.   POTASSIUM PO Take by mouth daily.   pregabalin (LYRICA) 200 MG capsule TAKE 1 CAPSULE 3 TIMES A   DAY   rOPINIRole (REQUIP) 1 MG tablet TAKE 1 TO 2 TABLETS BY MOUTH AT BEDTIME AS NEEDED   rOPINIRole (REQUIP) 2 MG tablet TAKE 1 TABLET BY MOUTH TWICE A DAY AS NEEDED   sucralfate (CARAFATE) 1 g tablet TAKE 1 TABLET BY MOUTH 4 (FOUR) TIMES DAILY - WITH MEALS AND AT BEDTIME.   tamsulosin (FLOMAX) 0.4 MG CAPS capsule Take 0.4 mg by mouth daily.   testosterone cypionate (DEPOTESTOSTERONE CYPIONATE) 200 MG/ML injection Inject 200 mg into the muscle every 21 ( twenty-one) days.    VITAMIN D PO Take by mouth daily at 2 am.   No facility-administered medications prior to visit.    Review of Systems  Constitutional:  Negative for appetite change, chills and fever.  Respiratory:  Negative for chest tightness, shortness of breath and wheezing.   Cardiovascular:  Negative for chest pain and palpitations.  Gastrointestinal:  Negative for abdominal pain, nausea and vomiting.  Musculoskeletal:  Positive for arthralgias and stiffness.  Skin:  Negative for itching.  Neurological:  Positive for tingling and numbness.       Objective    BP (!) 146/65 (BP Location: Right Arm, Patient Position: Sitting, Cuff Size: Large)   Pulse 71   Temp 98 F (36.7 C) (Oral)   Resp 18   Wt 265 lb (120.2 kg)   SpO2 96%   BMI 36.96 kg/m  BP Readings from Last 3 Encounters:  11/05/21 (!) 146/65  10/15/21 138/60  09/02/21 (!) 144/61   Wt Readings from Last 3 Encounters:  11/05/21 265 lb (120.2 kg)  10/15/21 267 lb (121.1 kg)  09/02/21 265 lb (120.2 kg)      Physical Exam Vitals reviewed.  Constitutional:      Appearance: He is well-developed. He is obese.  HENT:     Head: Normocephalic and atraumatic.     Right Ear: External  ear normal.     Left Ear: External ear normal.     Nose: Nose normal.  Eyes:     General: No scleral icterus.    Conjunctiva/sclera: Conjunctivae normal.  Neck:     Thyroid: No thyromegaly.  Cardiovascular:     Rate and Rhythm: Normal rate and regular rhythm.     Heart sounds: Normal heart sounds.  Pulmonary:     Effort: Pulmonary effort is normal.     Breath sounds: Normal breath sounds.  Abdominal:     Palpations: Abdomen  is soft.  Musculoskeletal:     Right lower leg: Edema present.     Left lower leg: Edema present.     Comments: 1+ LE edema.   He has good strength in both upper extremities but has pain with abduction of his left shoulder. He also has tenderness in the anterior shoulder  Skin:    General: Skin is warm and dry.  Neurological:     Mental Status: He is alert and oriented to person, place, and time. Mental status is at baseline.     Comments: Decreased sensation to monofilament in bilateral anterior feet. No nystagmus.  Gait is normal.  Psychiatric:        Mood and Affect: Mood normal.        Behavior: Behavior normal.        Thought Content: Thought content normal.        Judgment: Judgment normal.      No results found for any visits on 11/05/21.  Assessment & Plan     1. Chronic left shoulder pain At this time will treat with prednisone 20 mg daily for a week and refer to Ortho. - AMB referral to orthopedics - predniSONE (DELTASONE) 20 MG tablet; Take 1 tablet (20 mg total) by mouth daily with breakfast.  Dispense: 7 tablet; Refill: 0  2. Need for influenza vaccination  - Flu Vaccine QUAD High Dose(Fluad)  3. Paroxysmal atrial fibrillation (HCC) On amiodarone and Eliquis  4. Aortic atherosclerosis ([place]) All risk factors treated  5. Chronic diastolic CHF (congestive heart failure) (HCC) Clinically stable  6. OSA on CPAP Uses CPAP nightly  7. Primary osteoarthritis of both knees Status post knee replaced  8. S/P parathyroidectomy  ([place])   9. Spinal stenosis of lumbar region without neurogenic claudication   10. 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  He has a good candidate with his poorly controlled diabetes    No follow-ups on file.      I, [name], MD, have reviewed all documentation for this visit. The documentation on 11/09/21 for the exam, diagnosis, procedures, and orders are all accurate and complete.    Loreto Loescher Cranford Mon, MD  [facility] [phone] (phone) [phone] (fax)  [place]

## 2021-11-05 NOTE — Patient Instructions (Signed)
WAIT UNTIL Sunday OR Monday TO PREDNISONE, DUE TO GETTING FLU SHOT.

## 2021-11-10 DIAGNOSIS — E291 Testicular hypofunction: Secondary | ICD-10-CM | POA: Diagnosis not present

## 2021-11-10 DIAGNOSIS — N5201 Erectile dysfunction due to arterial insufficiency: Secondary | ICD-10-CM | POA: Diagnosis not present

## 2021-11-10 DIAGNOSIS — N401 Enlarged prostate with lower urinary tract symptoms: Secondary | ICD-10-CM | POA: Diagnosis not present

## 2021-11-10 DIAGNOSIS — Z79899 Other long term (current) drug therapy: Secondary | ICD-10-CM | POA: Diagnosis not present

## 2021-11-11 ENCOUNTER — Other Ambulatory Visit: Payer: Self-pay | Admitting: Family Medicine

## 2021-11-11 DIAGNOSIS — Z79899 Other long term (current) drug therapy: Secondary | ICD-10-CM | POA: Diagnosis not present

## 2021-11-11 DIAGNOSIS — E291 Testicular hypofunction: Secondary | ICD-10-CM | POA: Diagnosis not present

## 2021-11-11 DIAGNOSIS — K219 Gastro-esophageal reflux disease without esophagitis: Secondary | ICD-10-CM

## 2021-11-11 DIAGNOSIS — N401 Enlarged prostate with lower urinary tract symptoms: Secondary | ICD-10-CM | POA: Diagnosis not present

## 2021-11-11 MED ORDER — ESOMEPRAZOLE MAGNESIUM 40 MG PO CPDR: 40.0000 mg | DELAYED_RELEASE_CAPSULE | Freq: Two times a day (BID) | ORAL | 0 refills | Status: DC

## 2021-11-11 NOTE — Telephone Encounter (Signed)
Medication Refill - Medication: esomeprazole (NEXIUM) 40 MG capsule  Has the patient contacted their pharmacy? Yes  (Agent: If no, request that the patient contact the pharmacy for the refill. If patient does not wish to contact the pharmacy document the reason why and proceed with request.) (Agent: If yes, when and what did the pharmacy advise?)contact pcp  Preferred Pharmacy (with phone number or street name): [facility] [place], [place] to Registered [facility]  Phone:  [phone] Fax:  [phone] Has the patient been seen for an appointment in the last year OR does the patient have an upcoming appointment? yes  Agent: Please be advised that RX refills may take up to 3 business days. We ask that you follow-up with your pharmacy.

## 2021-11-11 NOTE — Telephone Encounter (Signed)
Requested Prescriptions  Pending Prescriptions Disp Refills  . esomeprazole (NEXIUM) 40 MG capsule 180 capsule 0    Sig: Take 1 capsule (40 mg total) by mouth 2 (two) times daily.     Gastroenterology: Proton Pump Inhibitors Passed - 11/11/2021  1:18 PM      Passed - Valid encounter within last 12 months    Recent Outpatient Visits          6 days ago Chronic left shoulder pain   [facility] Jerrol Banana., MD   2 months ago Diabetes mellitus type 2, uncontrolled, with complications [facility])   [facility] Jerrol Banana., MD   3 months ago Cellulitis of lower extremity, unspecified laterality   [facility] Jerrol Banana., MD   5 months ago Intercostal pain   [facility] Jerrol Banana., MD   6 months ago Diabetes mellitus type 2, uncontrolled, with complications [facility])   [facility] Jerrol Banana., MD      Future Appointments            In 2 days Gollan, Kathlene November, MD [facility], [facility]

## 2021-11-11 NOTE — Progress Notes (Signed)
Cardiology Office Note  Date:  11/13/2021   ID:  ZI [place], DOB 30-Apr-1951, MRN [id number]  PCP:  Jerrol Banana., MD   Chief Complaint  Patient presents with   1 month follow up     Patient c/o LE edema. Medications reviewed by the patient verbally.     HPI:  Mr. Futch is a  70 year old gentleman, multiple back surgeries,  spinal stenosis , s/p arthroscopic knee surgery, total knee replacement left obesity,  poorly controlled diabetes ,  DVT, PE in March 2011 previously on warfarin,  30 year smoking history,   neuropathy hypertension,  depression,  renal disease,  obstructive sleep apnea on CPAP,  chronic sweating at nighttime which he attributes to the Vicodin. Prior severe pneumonia, ATN Remote atrial flutter dating back to 2014, fibrillation, last episode in 2015. Not on anticoagulation Flutter with cardioversion 08/2020 presenting for routine followup of his hypertension , atrial flutter  Last seen in clinic by myself March 2022 On prednisone for shoulder, Hx of rotator cuff injury  Reports breathing is stable Denies any tachycardia concerning for arrhythmia  Reports medication compliance Weight continues to run high, but watching his diet  Chronic lower extremity edema, does not wear compression hose More venous insufficiency  Lab work reviewed A1c improving down to 7.3, last year was in the 9 range  Echocardiogram June 2021  mild LVH otherwise normal LV size and function   total cholesterol 134 LDL 67  EKG personally reviewed by myself on todays visit Normal sinus rhythm rate 71 bpm nonspecific T wave abnormality  Other past medical history reviewed  hospitalization June 05, 2020  atrial flutter Underwent cardioversion, 09/11/2020 NSR restored  Chronic back pain 9 back surgeries, previously thought about  spinal cord stimulator  Admission 5/18 for sepsis, salmonella diarrhea, per the discharge summary Hospital records reviewed with  the patient in detail From eggs, per the patient He did not receive antibiotics  pneumonia while at the [facility] January 2017 He was in ICU for 3 days, long recovery Lost more than 30 pounds,  Lipid panel done while he was very ill early 2017, total cholesterol at that time 89  Denies any atrial fibrillation through his hospital course, reports he was changed to amiodarone IV infusion and then back to pill when he was tolerating oral medications  Previous event where he had buckling of his knee, fall, contusion, TIA-type symptoms that took him to the hospital. He was kept overnight and most of his workup was relatively unrevealing including MRI/MRA, carotid ultrasound, echocardiogram.   History of atrial flutter, status post cardioversion on 03/14/2013.   2 admissions to the hospital for gastroenteritis/ campylobacter infection. Required a long course of antibiotics. Possible  infection from tainted chicken.   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  PSH:    Past Surgical History:  Procedure Laterality Date   BACK SURGERY     x 8; upper x 3 & lower x 5   CARDIOVERSION  03/14/13, 10/16   2014 - [place], 2016 - [place]   CARDIOVERSION N/A 09/11/2020   Procedure: CARDIOVERSION;  Surgeon: Minna Merritts, MD;  Location: [facility] ORS;  Service: Cardiovascular;  Laterality: N/A;   CATARACT EXTRACTION W/PHACO Left 10/29/2015   Procedure: CATARACT EXTRACTION PHACO AND INTRAOCULAR LENS PLACEMENT (IOC);  Surgeon:  Leandrew Koyanagi, MD;  Location: [place];  Service: Ophthalmology;  Laterality: Left;  DIABETIC - insulin and oral medsSleep apnea - no machine   CHOLECYSTECTOMY     COLONOSCOPY WITH PROPOFOL N/A 01/16/2018   Procedure: COLONOSCOPY WITH PROPOFOL;  Surgeon: Manya Silvas, MD;  Location: [facility] ENDOSCOPY;  Service: Endoscopy;  Laterality: N/A;   ESOPHAGOGASTRODUODENOSCOPY (EGD) WITH PROPOFOL N/A 01/16/2018   Procedure: ESOPHAGOGASTRODUODENOSCOPY (EGD) WITH PROPOFOL;  Surgeon: Manya Silvas, MD;  Location: [facility] END[facility]COPY;  Service: Endoscopy;  Laterality: N/A;   EYE SURGERY     GALLBLADDER SURGERY  2002   JOINT REPLACEMENT Right 2018   KNEE ARTHROSCOPY     left    PARATHYROIDECTOMY N/A 07/25/2020   Procedure: PARATHYROIDECTOMY;  Surgeon: Fredirick Maudlin, MD;  Location: [facility] ORS;  Service: General;  Laterality: N/A;   ROTATOR CUFF REPAIR  2001   left    TEE WITHOUT CARDIOVERSION N/A 04/26/2018   Procedure: TRANSESOPHAGEAL ECHOCARDIOGRAM (TEE);  Surgeon: Nelva Bush, MD;  Location: [facility] ORS;  Service: Cardiovascular;  Laterality: N/A;   TONSILLECTOMY     TOTAL KNEE ARTHROPLASTY Right 06/20/2017   Procedure: RIGHT TOTAL KNEE ARTHROPLASTY;  Surgeon: Gaynelle Arabian, MD;  Location: [facility] ORS;  Service: Orthopedics;  Laterality: Right;    Current Outpatient Medications  Medication Sig Dispense Refill   amiodarone (PACERONE) 200 MG tablet TAKE 1 TABLET BY MOUTH EVERY DAY 90 tablet 2   apixaban (ELIQUIS) 5 MG TABS tablet TAKE 1 TABLET TWICE A DAY 180 tablet 1   BD PEN NEEDLE NANO 2ND GEN 32G X 4 MM MISC Please specify directions, refills and quantity 100 each 12   Continuous Blood Gluc Sensor MISC 1 each by Does not apply route as directed. Use as directed every 14 days. May dispense FreeStyle [employer] or similar. 2 each 5   cyclobenzaprine (FLEXERIL) 10 MG tablet TAKE 1 TABLET BY MOUTH 3 TIMES A DAY 90 tablet 12   dutasteride (AVODART) 0.5 MG capsule TAKE 1 CAPSULE  DAILY 90 capsule 3   esomeprazole (NEXIUM) 40 MG capsule Take 1 capsule (40 mg total) by mouth 2 (two) times daily. 180 capsule 0   FARXIGA 10 MG TABS tablet TAKE 1 TABLET BY MOUTH EVERY DAY 90 tablet 3   ferrous sulfate 325 (65 FE) MG tablet Take 1 tablet by mouth daily.     fexofenadine (ALLEGRA) 180 MG tablet Take 180 mg by mouth daily.     furosemide (LASIX) 40 MG tablet TAKE 1 TABLET BY MOUTH EVERY DAY 90 tablet 0   gabapentin (NEURONTIN) 100 MG capsule TAKE 1 CAPSULE BY MOUTH TWICE A DAY 180 capsule 1   gabapentin (NEURONTIN) 300 MG capsule Take 300 mg by mouth at bedtime.     insulin aspart (NOVOLOG FLEXPEN) 100 UNIT/ML FlexPen Inject 14 Units into the skin in the morning and at bedtime. 15 mL 11   Insulin Glargine (LANTUS SOLOSTAR [place]) Inject 40 Units into the skin 2 (two) times daily.     Insulin Pen Needle (PEN NEEDLES) 31G X 5 MM MISC 1 each by Does not apply route daily. PATIENT NEEDS NOVA TWIST NEEDLES 5 MM  DX E11.9 200 each 3   JANUVIA 100 MG tablet TAKE 1 TABLET DAILY 90 tablet 3   LANTUS SOLOSTAR 100 UNIT/ML Solostar Pen Inject 40  Units into the skin 2 (two) times daily. 45 mL 3   liraglutide (VICTOZA) 18 MG/3ML SOPN Inject 1.8 mg into the skin daily. 27 mL 3   magnesium oxide (MAG-OX) 400 MG tablet TAKE 1 TABLET (400 MG TOTAL) BY MOUTH 2 (TWO) TIMES DAILY. 180 tablet 3   metoprolol tartrate (LOPRESSOR) 100 MG tablet TAKE 1 TABLET BY MOUTH TWICE A DAY 60 tablet 12   mupirocin cream (BACTROBAN) 2 % APPLY TO AFFECTED AREA TWICE A DAY 30 g 0   mupirocin ointment (BACTROBAN) 2 % Apply 1 application topically 2 (two) times daily as needed. 30 g 0   Naloxone HCl 0.4 MG/0.4ML SOAJ Inject as needed for suspected overdose. 0.4 mL 2   Omega-3 Fatty Acids (FISH OIL) 1200 MG CAPS Take 1,200 mg by mouth 2 (two) times daily.      ONETOUCH ULTRA test strip USE WITH METER TWICE DAILY 100 strip 6   oxyCODONE (OXYCONTIN) 40 mg 12 hr tablet Take 1 tablet (40 mg total) by mouth every 8 (eight) hours  as needed. 270 tablet 0   POTASSIUM PO Take by mouth daily.     predniSONE (DELTASONE) 20 MG tablet Take 1 tablet (20 mg total) by mouth daily with breakfast. 7 tablet 0   pregabalin (LYRICA) 200 MG capsule TAKE 1 CAPSULE 3 TIMES A   DAY 270 capsule 1   rOPINIRole (REQUIP) 1 MG tablet TAKE 1 TO 2 TABLETS BY MOUTH AT BEDTIME AS NEEDED 60 tablet 4   rOPINIRole (REQUIP) 2 MG tablet TAKE 1 TABLET BY MOUTH TWICE A DAY AS NEEDED 180 tablet 2   sucralfate (CARAFATE) 1 g tablet TAKE 1 TABLET BY MOUTH 4 (FOUR) TIMES DAILY - WITH MEALS AND AT BEDTIME. 360 tablet 1   tamsulosin (FLOMAX) 0.4 MG CAPS capsule Take 0.4 mg by mouth daily.     testosterone cypionate (DEPOTESTOSTERONE CYPIONATE) 200 MG/ML injection Inject 200 mg into the muscle every 21 ( twenty-one) days.      VITAMIN D PO Take by mouth daily at 2 am.     No current facility-administered medications for this visit.    Allergies:   Carisoprodol and Tamsulosin   Social History:  The patient  reports that he quit smoking about 31 years ago. His smoking use included cigarettes. He has a 40.00 pack-year smoking history. He has never used smokeless tobacco. He reports that he does not drink alcohol and does not use drugs.   Family History:   family history includes Heart attack in his brother.    Review of Systems: Review of Systems  Constitutional: Negative.   HENT: Negative.    Respiratory: Negative.    Cardiovascular: Negative.   Gastrointestinal: Negative.   Musculoskeletal:  Positive for back pain and joint pain.       Tingling foot pain  Neurological: Negative.   Psychiatric/Behavioral: Negative.    All other systems reviewed and are negative.  PHYSICAL EXAM: VS:  BP 140/66 (BP Location: Left Arm, Patient Position: Sitting, Cuff Size: Large)   Pulse 71   Ht 5\' 11"  (1.803 m)   Wt 264 lb (119.7 kg)   SpO2 97%   BMI 36.82 kg/m  , BMI Body mass index is 36.82 kg/m. Constitutional:  oriented to person, place, and time. No  distress.  HENT:  Head: Grossly normal Eyes:  no discharge. No scleral icterus.  Neck: No JVD, no carotid bruits  Cardiovascular: Regular rate and rhythm, no murmurs appreciated 1-2+ nonpitting leg swelling with  brawny skin changes Nonpitting significant leg swelling with chronic stasis changes Pulmonary/Chest: Clear to auscultation bilaterally, no wheezes or rails Abdominal: Soft.  no distension.  no tenderness.  Musculoskeletal: Normal range of motion Neurological:  normal muscle tone. Coordination normal. No atrophy Skin: Skin warm and dry Psychiatric: normal affect, pleasant  Recent Labs: 10/22/2021: BUN 24; Creatinine, Ser 1.28; Potassium 4.1; Sodium 142    Lipid Panel Lab Results  Component Value Date   CHOL 134 05/15/2020   HDL 29 (L) 05/15/2020   LDLCALC 67 05/15/2020   TRIG 233 (H) 05/15/2020      Wt Readings from Last 3 Encounters:  11/13/21 264 lb (119.7 kg)  11/05/21 265 lb (120.2 kg)  10/15/21 267 lb (121.1 kg)     ASSESSMENT AND PLAN:  Atrial flutter, unspecified type ([place]) - Plan: EKG 12-Lead Maintaining normal sinus rhythm,prior cardioversion On  anticoagulation, metoprolol, amiodarone No changes to his regimen  HYPERTENSION, BENIGN -  Blood pressure is well controlled on today's visit. No changes made to the medications.  Paroxysmal atrial fibrillation (HCC) - Plan: EKG 12-Lead On amiodarone and Eliquis, metoprolol With atrial flutter on EKG since June 2021 Maintaining normal sinus rhythm  Hyperlipidemia Cholesterol is at goal on the current lipid regimen. No changes to the medications were made.  Chronic diastolic CHF (congestive heart failure) (HCC) Teds, appears euvolemic Leg swelling out of proportion to cardiac status, consistent with venous insufficiency  Uncontrolled type 2 diabetes mellitus with complication, with long-term current Recommend continued dietary discretion, walking program, weight loss A1c is improving 9 down to  7.5  Primary osteoarthritis of both knees Limited mobility secondary to back and leg discomfort, knee pain  Leg swelling Significant venous component, recommended compression hose, leg elevation  Neuropathy chronic pain History of fusions   Total encounter time more than 25 minutes  Greater than 50% was spent in counseling and coordination of care with the patient    Orders Placed This Encounter  Procedures   EKG 12-Lead     Signed, Esmond Plants, M.D., Ph.D. 11/13/2021  [place], [place] [phone]

## 2021-11-13 ENCOUNTER — Ambulatory Visit (INDEPENDENT_AMBULATORY_CARE_PROVIDER_SITE_OTHER): Payer: Medicare Other | Admitting: Cardiovascular Disease

## 2021-11-13 ENCOUNTER — Encounter: Payer: Self-pay | Admitting: Cardiovascular Disease

## 2021-11-13 ENCOUNTER — Other Ambulatory Visit: Payer: Self-pay

## 2021-11-13 VITALS — BP 140/66 | HR 71 | Ht 71.0 in | Wt 264.0 lb

## 2021-11-13 DIAGNOSIS — I480 Paroxysmal atrial fibrillation: Secondary | ICD-10-CM | POA: Diagnosis not present

## 2021-11-13 DIAGNOSIS — I700 Atherosclerosis of aorta: Secondary | ICD-10-CM

## 2021-11-13 DIAGNOSIS — I483 Typical atrial flutter: Secondary | ICD-10-CM | POA: Diagnosis not present

## 2021-11-13 DIAGNOSIS — I5032 Chronic diastolic (congestive) heart failure: Secondary | ICD-10-CM | POA: Diagnosis not present

## 2021-11-13 DIAGNOSIS — E782 Mixed hyperlipidemia: Secondary | ICD-10-CM

## 2021-11-13 DIAGNOSIS — I10 Essential (primary) hypertension: Secondary | ICD-10-CM

## 2021-11-13 DIAGNOSIS — N183 Chronic kidney disease, stage 3 unspecified: Secondary | ICD-10-CM | POA: Diagnosis not present

## 2021-11-13 NOTE — Patient Instructions (Addendum)
Patient assistance of Eliquis Complete form and mail/drop back off for provider to sign Print out of "out-of-pocket" expense form pharmacy Proof of income  Medication Instructions:  No changes  If you need a refill on your cardiac medications before your next appointment, please call your pharmacy.   Lab work: No new labs needed  Testing/Procedures: No new testing needed  Follow-Up: At [facility], you and your health needs are our priority.  As part of our continuing mission to provide you with exceptional heart care, we have created designated Provider Care Teams.  These Care Teams include your primary Cardiologist (physician) and Advanced Practice Providers (APPs -  Physician Assistants and Nurse Practitioners) who all work together to provide you with the care you need, when you need it.  You will need a follow up appointment in 6 months  Providers on your designated Care Team:   Murray Hodgkins, NP Christell Faith, PA-C Cadence Kathlen Mody, [place]  COVID-19 Vaccine Information can be found at: [email or web address].uk For questions related to vaccine distribution or appointments, please email [email or web address] or call [phone].

## 2021-11-23 ENCOUNTER — Other Ambulatory Visit: Payer: Self-pay | Admitting: Family Medicine

## 2021-11-23 ENCOUNTER — Telehealth: Payer: Self-pay

## 2021-11-23 NOTE — Telephone Encounter (Signed)
Spoke with patient's wife, Rise Paganini, and advised prescription was sent on 11/11/2021.  Per wife a PA is required for prescription.

## 2021-11-23 NOTE — Telephone Encounter (Signed)
Patient needs refill authorizations sent to [facility] for Generic Nexium 40 mg. 1 tablet twice a day.

## 2021-11-24 NOTE — Telephone Encounter (Signed)
PA started

## 2021-12-07 ENCOUNTER — Other Ambulatory Visit: Payer: Self-pay | Admitting: Family Medicine

## 2021-12-07 NOTE — Telephone Encounter (Signed)
Pharmacy is asking that this rx for insulin needles to be changed to "For testing 5 times daily " instead of "4 times daily". Insulin needles are not used for "testing" I am refusing this Rx as I do not know whether they want lancets or testing strips.  Requested Prescriptions  Pending Prescriptions Disp Refills  . NOVOFINE PLUS PEN NEEDLE 32G X 4 MM MISC [Pharmacy Med Name: NOVOFINE PLUS PEN NEEDLE 32G X 4 MM] 200 each 11    Sig: USE AS DIRECTED DAILY     Endocrinology: Diabetes - Testing Supplies Passed - 12/07/2021  5:23 PM      Passed - Valid encounter within last 12 months    Recent Outpatient Visits          1 month ago Chronic left shoulder pain   [facility] Jerrol Banana., MD   3 months ago Diabetes mellitus type 2, uncontrolled, with complications [facility])   [facility] Jerrol Banana., MD   4 months ago Cellulitis of lower extremity, unspecified laterality   [facility] Jerrol Banana., MD   5 months ago Intercostal pain   [facility] Jerrol Banana., MD   7 months ago Diabetes mellitus type 2, uncontrolled, with complications [facility])   [facility] Jerrol Banana., MD      Future Appointments            In 5 months Gollan, Kathlene November, MD [facility], [facility]

## 2021-12-10 DIAGNOSIS — E291 Testicular hypofunction: Secondary | ICD-10-CM | POA: Diagnosis not present

## 2021-12-17 ENCOUNTER — Ambulatory Visit
Admission: RE | Admit: 2021-12-17 | Discharge: 2021-12-17 | Disposition: A | Discharge status: Home / Self Care | Payer: Medicare Other | Attending: Physician Assistant | Admitting: Physician Assistant

## 2021-12-17 ENCOUNTER — Ambulatory Visit
Admission: RE | Admit: 2021-12-17 | Discharge: 2021-12-17 | Disposition: A | Discharge status: Home / Self Care | Payer: Medicare Other | Source: Ambulatory Visit | Attending: Physician Assistant | Admitting: Physician Assistant

## 2021-12-17 ENCOUNTER — Other Ambulatory Visit: Payer: Self-pay

## 2021-12-17 ENCOUNTER — Ambulatory Visit (INDEPENDENT_AMBULATORY_CARE_PROVIDER_SITE_OTHER): Payer: Medicare Other | Admitting: Physician Assistant

## 2021-12-17 ENCOUNTER — Encounter: Payer: Self-pay | Admitting: Physician Assistant

## 2021-12-17 VITALS — BP 145/69 | HR 108 | Temp 101.1°F | Wt 267.0 lb

## 2021-12-17 DIAGNOSIS — J988 Other specified respiratory disorders: Secondary | ICD-10-CM | POA: Diagnosis not present

## 2021-12-17 DIAGNOSIS — R059 Cough, unspecified: Secondary | ICD-10-CM | POA: Diagnosis not present

## 2021-12-17 DIAGNOSIS — R079 Chest pain, unspecified: Secondary | ICD-10-CM | POA: Diagnosis not present

## 2021-12-17 MED ORDER — DOXYCYCLINE HYCLATE 100 MG PO TABS: 100.0000 mg | ORAL_TABLET | Freq: Two times a day (BID) | ORAL | 0 refills | Status: AC

## 2021-12-17 MED ORDER — AZITHROMYCIN 250 MG PO TABS: ORAL_TABLET | ORAL | 0 refills | Status: DC

## 2021-12-17 MED ORDER — AMOXICILLIN-POT CLAVULANATE 875-125 MG PO TABS
1.0000 | ORAL_TABLET | Freq: Two times a day (BID) | ORAL | 0 refills | Status: AC
Start: 2021-12-17 — End: 2021-12-24

## 2021-12-17 NOTE — Assessment & Plan Note (Addendum)
Acute since this morning but patient has history of rapid progression pneumonia and hx of sepsis from pneumonia Provided rx for augmentin and doxycycline in accordance with CAP management, (patient currently taking amiodarone which limits choices to doxycycline in conjunction with augmentin) Sent for chest xray  Follow up if not improving or go to ER for worsening symptoms

## 2021-12-17 NOTE — Patient Instructions (Addendum)
I have ordered a Chest Xray to determine if you have pneumonia I have also sent in prescriptions for Augmentin and Doxycycline  for pneumonia- finish the entire course of these medications  You can take Acetaminophen for your fever. You can take an ibuprofen as needed for breakthrough fever but limit to one per day if possible.  If your symptoms do not improve please return to the office If your symptoms worsen and you have trouble breathing, increased fever, confusion, passing out please go to the ER

## 2021-12-17 NOTE — Progress Notes (Signed)
Established patient visit   Patient: Jerry Parsons   DOB: 1951-02-12   70 y.o. Male  MRN: [id number] Visit Date: 12/17/2021  Today's healthcare provider: Dani Gobble Tyesha Joffe, PA-C   No chief complaint on file.  Subjective    Cough Associated symptoms include a fever, myalgias and rhinorrhea. Pertinent negatives include no chest pain, ear pain, headaches, rash, sore throat or shortness of breath.   Productive cough began today  Patient is febrile in office with decreased O2 sat  Denies difficulty breathing at this time Patient and wife state he has a hx of rapidly developing pneumonia that begins similarly to this Hx of sepsis with pneumonia etiology    Medications: Outpatient Medications Prior to Visit  Medication Sig   amiodarone (PACERONE) 200 MG tablet TAKE 1 TABLET BY MOUTH EVERY DAY   apixaban (ELIQUIS) 5 MG TABS tablet TAKE 1 TABLET TWICE A DAY   Continuous Blood Gluc Sensor MISC 1 each by Does not apply route as directed. Use as directed every 14 days. May dispense FreeStyle [employer] or similar.   cyclobenzaprine (FLEXERIL) 10 MG tablet TAKE 1 TABLET BY MOUTH 3 TIMES A DAY   dutasteride (AVODART) 0.5 MG capsule TAKE 1 CAPSULE DAILY   esomeprazole (NEXIUM) 40 MG capsule Take 1 capsule (40 mg total) by mouth 2 (two) times daily.   FARXIGA 10 MG TABS tablet TAKE 1 TABLET BY MOUTH EVERY DAY   ferrous sulfate 325 (65 FE) MG tablet Take 1 tablet by mouth daily.   fexofenadine (ALLEGRA) 180 MG tablet Take 180 mg by mouth daily.   furosemide (LASIX) 40 MG tablet TAKE 1 TABLET BY MOUTH EVERY DAY   gabapentin (NEURONTIN) 100 MG capsule TAKE 1 CAPSULE BY MOUTH TWICE A DAY   gabapentin (NEURONTIN) 300 MG capsule Take 300 mg by mouth at bedtime.   insulin aspart (NOVOLOG FLEXPEN) 100 UNIT/ML FlexPen Inject 14 Units into the skin in the morning and at bedtime.   Insulin Glargine (LANTUS SOLOSTAR [place]) Inject 40 Units into the skin 2 (two) times daily.   Insulin Pen Needle  (PEN NEEDLES) 31G X 5 MM MISC 1 each by Does not apply route daily. PATIENT NEEDS NOVA TWIST NEEDLES 5 MM  DX E11.9   JANUVIA 100 MG tablet TAKE 1 TABLET DAILY   LANTUS SOLOSTAR 100 UNIT/ML Solostar Pen Inject 40 Units into the skin 2 (two) times daily.   liraglutide (VICTOZA) 18 MG/3ML SOPN Inject 1.8 mg into the skin daily.   magnesium oxide (MAG-OX) 400 MG tablet TAKE 1 TABLET (400 MG TOTAL) BY MOUTH 2 (TWO) TIMES DAILY.   metoprolol tartrate (LOPRESSOR) 100 MG tablet TAKE 1 TABLET BY MOUTH TWICE A DAY   mupirocin cream (BACTROBAN) 2 % APPLY TO AFFECTED AREA TWICE A DAY   mupirocin ointment (BACTROBAN) 2 % Apply 1 application topically 2 (two) times daily as needed.   Naloxone HCl 0.4 MG/0.4ML SOAJ Inject as needed for suspected overdose.   NOVOFINE PLUS PEN NEEDLE 32G X 4 MM MISC USE AS DIRECTED DAILY   Omega-3 Fatty Acids (FISH OIL) 1200 MG CAPS Take 1,200 mg by mouth 2 (two) times daily.    ONETOUCH ULTRA test strip USE WITH METER TWICE DAILY   oxyCODONE (OXYCONTIN) 40 mg 12 hr tablet Take 1 tablet (40 mg total) by mouth every 8 (eight) hours as needed.   POTASSIUM PO Take by mouth daily.   predniSONE (DELTASONE) 20 MG tablet Take 1 tablet (20 mg  total) by mouth daily with breakfast.   pregabalin (LYRICA) 200 MG capsule TAKE 1 CAPSULE 3 TIMES A   DAY   rOPINIRole (REQUIP) 1 MG tablet TAKE 1 TO 2 TABLETS BY MOUTH AT BEDTIME AS NEEDED   rOPINIRole (REQUIP) 2 MG tablet TAKE 1 TABLET BY MOUTH TWICE A DAY AS NEEDED   sucralfate (CARAFATE) 1 g tablet TAKE 1 TABLET BY MOUTH 4 (FOUR) TIMES DAILY - WITH MEALS AND AT BEDTIME.   tamsulosin (FLOMAX) 0.4 MG CAPS capsule Take 0.4 mg by mouth daily.   testosterone cypionate (DEPOTESTOSTERONE CYPIONATE) 200 MG/ML injection Inject 200 mg into the muscle every 21 ( twenty-one) days.    VITAMIN D PO Take by mouth daily at 2 am.   No facility-administered medications prior to visit.    Review of Systems  Constitutional:  Positive for fatigue and fever.   HENT:  Positive for rhinorrhea. Negative for congestion, ear pain, sinus pressure, sinus pain, sore throat and tinnitus.   Respiratory:  Positive for cough. Negative for shortness of breath.   Cardiovascular:  Negative for chest pain.  Gastrointestinal:  Negative for constipation, diarrhea, nausea and vomiting.  Musculoskeletal:  Positive for myalgias. Negative for neck pain and neck stiffness.  Skin:  Negative for rash.  Neurological:  Positive for dizziness. Negative for light-headedness and headaches.      Objective    BP (!) 145/69 (BP Location: Right Arm, Patient Position: Sitting, Cuff Size: Large)    Pulse (!) 108    Temp (!) 101.1 F (38.4 C) (Oral)    Wt 267 lb (121.1 kg)    BMI 37.24 kg/m    Physical Exam Constitutional:      Appearance: Normal appearance. He is well-developed, well-groomed and overweight.  HENT:     Head: Normocephalic and atraumatic.     Right Ear: Hearing, tympanic membrane, ear canal and external ear normal.     Left Ear: Hearing, tympanic membrane, ear canal and external ear normal.     Mouth/Throat:     Mouth: Mucous membranes are moist.     Dentition: Has dentures.     Pharynx: Oropharynx is clear. Uvula midline. No oropharyngeal exudate or posterior oropharyngeal erythema.     Tonsils: No tonsillar exudate or tonsillar abscesses.     Comments: White substance noted in oral cavity and on tongue- patient states this is from denture adhesive  Cardiovascular:     Rate and Rhythm: Regular rhythm. Tachycardia present.     Pulses: Normal pulses.     Heart sounds: Normal heart sounds.  Pulmonary:     Effort: Pulmonary effort is normal. No respiratory distress.     Breath sounds: Decreased air movement present. Decreased breath sounds present. No wheezing, rhonchi or rales.     Comments: Negative egophony  Neurological:     Mental Status: He is alert.  Psychiatric:        Behavior: Behavior is cooperative.      No results found for any visits  on 12/17/21.  Assessment & Plan     Problem List Items Addressed This Visit       Respiratory   Respiratory tract infection - Primary    Acute since this morning but patient has history of rapid progression pneumonia and hx of sepsis from pneumonia Provided rx for augmentin and doxycycline in accordance with CAP management, (patient currently taking amiodarone which limits choices to doxycycline in conjunction with augmentin) Sent for chest xray  Follow up if not improving  or go to ER for worsening symptoms       Relevant Medications   amoxicillin-clavulanate (AUGMENTIN) 875-125 MG tablet   doxycycline (VIBRA-TABS) 100 MG tablet   Other Relevant Orders   DG Chest 2 View     No follow-ups on file.   I, Jacque Byron E Britain Saber, PA-C, have reviewed all documentation for this visit. The documentation on 12/17/21 for the exam, diagnosis, procedures, and orders are all accurate and complete.   Deyana Wnuk, [place] MPH [facility]    No follow-ups on file.        Almon Register, PA-C  [employer] [phone] (phone) [phone] (fax)  [place]

## 2021-12-22 ENCOUNTER — Telehealth: Payer: Self-pay

## 2021-12-22 NOTE — Telephone Encounter (Signed)
Patient's wife Rise Paganini was advised.

## 2021-12-22 NOTE — Telephone Encounter (Signed)
Patient was here last Thursday with URI and had chest xray.   He has now tested positive for Covid (12/21/21) Does he need to do something else or is he qualified for Paxlovid? Call wife at home # or cell # .

## 2021-12-31 ENCOUNTER — Ambulatory Visit: Payer: Medicare Other | Admitting: Sports Medicine

## 2022-01-08 ENCOUNTER — Other Ambulatory Visit: Payer: Self-pay

## 2022-01-08 ENCOUNTER — Ambulatory Visit (INDEPENDENT_AMBULATORY_CARE_PROVIDER_SITE_OTHER): Payer: Medicare Other | Admitting: Sports Medicine

## 2022-01-08 ENCOUNTER — Encounter: Payer: Self-pay | Admitting: Sports Medicine

## 2022-01-08 DIAGNOSIS — M79674 Pain in right toe(s): Secondary | ICD-10-CM

## 2022-01-08 DIAGNOSIS — M79675 Pain in left toe(s): Secondary | ICD-10-CM | POA: Diagnosis not present

## 2022-01-08 DIAGNOSIS — B351 Tinea unguium: Secondary | ICD-10-CM

## 2022-01-08 DIAGNOSIS — I739 Peripheral vascular disease, unspecified: Secondary | ICD-10-CM

## 2022-01-08 DIAGNOSIS — E1142 Type 2 diabetes mellitus with diabetic polyneuropathy: Secondary | ICD-10-CM

## 2022-01-08 NOTE — Progress Notes (Signed)
Subjective: Jerry Parsons is a 71 y.o. male patient with history of diabetes who presents to office today complaining of long,mildly painful nails while ambulating in shoes; unable to trim.  Patient states that he is doing better had pneumonia and COVID 3 weeks ago. Last PCP  visit Dr. 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/25/2020   Primary hyperparathyroidism ([place])    Loss of consciousness ([place]) 06/05/2020   Osteoarthritis of both knees 11/11/2018   OSA on CPAP 10/14/2018   MRSA bacteremia 05/19/2018   Lumbar spondylosis 05/05/2018   Spinal stenosis of lumbar region 05/05/2018   Constipation, chronic 03/08/2018   Pain in limb 03/03/2018   Gastroesophageal reflux disease without esophagitis 01/04/2018   OA (osteoarthritis) of knee 06/20/2017   Sepsis due to pneumonia ([place]) 05/07/2017   Neuropathy 10/13/2016   Amblyopia 12/30/2015   Cornea scar 12/30/2015   NS (nuclear sclerosis) 12/30/2015   Pseudoaphakia 12/30/2015   Dehydration 09/08/2015   Aspiration pneumonia ([place]) 07/04/2015   Paroxysmal atrial fibrillation ([place]) 07/04/2015   Arthritis of knee, degenerative 05/28/2015   Chronic back pain 09/17/2014   Leg edema 05/07/2014   Chronic diastolic CHF (congestive heart failure) ([place]) 05/07/2014   Campylobacter diarrhea 04/25/2013   Atrial flutter ([place]) 01/23/2013   Obesity 05/19/2012   Hyperlipidemia 90/24/0973   Diastolic dysfunction 53/29/9242   SOB (shortness of breath) 04/12/2011   Diabetes mellitus type 2, uncontrolled, with complications 68/34/1962   HYPERTENSION, BENIGN 03/15/2011   DVT 03/15/2011   TACHYCARDIA 03/15/2011   Current Outpatient Medications on File Prior to Visit  Medication Sig  Dispense Refill   amiodarone (PACERONE) 200 MG tablet TAKE 1 TABLET BY MOUTH EVERY DAY 90 tablet 2   apixaban (ELIQUIS) 5 MG TABS tablet TAKE 1 TABLET TWICE A DAY 180 tablet 1   Continuous Blood Gluc Sensor MISC 1 each by Does not apply route as directed. Use as directed every 14 days. May dispense FreeStyle [employer] or similar. 2 each 5   cyclobenzaprine (FLEXERIL) 10 MG tablet TAKE 1 TABLET BY MOUTH 3 TIMES A DAY 90 tablet 12   dutasteride (AVODART) 0.5 MG capsule TAKE 1 CAPSULE DAILY 90 capsule 3   esomeprazole (NEXIUM) 40 MG capsule Take 1 capsule (40 mg total) by mouth 2 (two) times daily. 180 capsule 0   FARXIGA 10 MG TABS tablet TAKE 1 TABLET BY MOUTH EVERY DAY 90 tablet 3   ferrous sulfate 325 (65 FE) MG tablet Take 1 tablet by mouth daily.     fexofenadine (ALLEGRA) 180 MG tablet Take 180 mg by mouth daily.     furosemide (LASIX) 40 MG tablet TAKE 1 TABLET BY MOUTH EVERY DAY 90 tablet 0   gabapentin (NEURONTIN) 100 MG capsule TAKE 1 CAPSULE BY MOUTH TWICE A DAY 180 capsule 1   gabapentin (NEURONTIN) 300 MG capsule Take 300 mg by mouth at bedtime.     insulin aspart (NOVOLOG FLEXPEN) 100 UNIT/ML FlexPen Inject 14 Units into the skin in the morning and at bedtime. 15 mL 11   Insulin Glargine (LANTUS SOLOSTAR [place]) Inject 40 Units into the skin 2 (two) times daily.     Insulin Pen Needle (PEN NEEDLES) 31G X 5 MM MISC 1 each  by Does not apply route daily. PATIENT NEEDS NOVA TWIST NEEDLES 5 MM  DX E11.9 200 each 3   JANUVIA 100 MG tablet TAKE 1 TABLET DAILY 90 tablet 3   LANTUS SOLOSTAR 100 UNIT/ML Solostar Pen Inject 40 Units into the skin 2 (two) times daily. 45 mL 3   liraglutide (VICTOZA) 18 MG/3ML SOPN Inject 1.8 mg into the skin daily. 27 mL 3   magnesium oxide (MAG-OX) 400 MG tablet TAKE 1 TABLET (400 MG TOTAL) BY MOUTH 2 (TWO) TIMES DAILY. 180 tablet 3   metoprolol tartrate (LOPRESSOR) 100 MG tablet TAKE 1 TABLET BY MOUTH TWICE A DAY 60 tablet 12   mupirocin cream  (BACTROBAN) 2 % APPLY TO AFFECTED AREA TWICE A DAY 30 g 0   mupirocin ointment (BACTROBAN) 2 % Apply 1 application topically 2 (two) times daily as needed. 30 g 0   Naloxone HCl 0.4 MG/0.4ML SOAJ Inject as needed for suspected overdose. 0.4 mL 2   NOVOFINE PLUS PEN NEEDLE 32G X 4 MM MISC USE AS DIRECTED DAILY 200 each 11   Omega-3 Fatty Acids (FISH OIL) 1200 MG CAPS Take 1,200 mg by mouth 2 (two) times daily.      ONETOUCH ULTRA test strip USE WITH METER TWICE DAILY 100 strip 6   oxyCODONE (OXYCONTIN) 40 mg 12 hr tablet Take 1 tablet (40 mg total) by mouth every 8 (eight) hours as needed. 270 tablet 0   POTASSIUM PO Take by mouth daily.     predniSONE (DELTASONE) 20 MG tablet Take 1 tablet (20 mg total) by mouth daily with breakfast. 7 tablet 0   pregabalin (LYRICA) 200 MG capsule TAKE 1 CAPSULE 3 TIMES A   DAY 270 capsule 1   rOPINIRole (REQUIP) 1 MG tablet TAKE 1 TO 2 TABLETS BY MOUTH AT BEDTIME AS NEEDED 60 tablet 4   rOPINIRole (REQUIP) 2 MG tablet TAKE 1 TABLET BY MOUTH TWICE A DAY AS NEEDED 180 tablet 2   sucralfate (CARAFATE) 1 g tablet TAKE 1 TABLET BY MOUTH 4 (FOUR) TIMES DAILY - WITH MEALS AND AT BEDTIME. 360 tablet 1   tamsulosin (FLOMAX) 0.4 MG CAPS capsule Take 0.4 mg by mouth daily.     testosterone cypionate (DEPOTESTOSTERONE CYPIONATE) 200 MG/ML injection Inject 200 mg into the muscle every 21 ( twenty-one) days.      VITAMIN D PO Take by mouth daily at 2 am.     No current facility-administered medications on file prior to visit.   Allergies  Allergen Reactions   Carisoprodol Itching   Tamsulosin     Pt stated, "upsets my stomach"    Objective: General: Patient is awake, alert, and oriented x 3 and in no acute distress.  Integument: Skin is warm, dry and supple bilateral. Nails are tender, long, thickened and dystrophic with subungual debris, consistent with onychomycosis, 1-5 bilateral. No open lesions or preulcerative lesions present bilateral. Remaining integument  unremarkable. .  Vasculature:  Dorsalis Pedis pulse 0/4 bilateral. Posterior Tibial pulse  0/4 bilateral. Capillary fill time <3 sec 1-5 bilateral. No hair growth to the level of the digits.Temperature gradient within normal limits. No varicosities present bilateral. 1+ pitting edema present bilateral. +Stucco dermatitis due to PVD without infection.   Neurology: The patient has absent sensation measured with a 5.07/10g Semmes Weinstein Monofilament at all pedal sites bilateral, unchanged from prior  Musculoskeletal: Asymptomatic hammertoes pedal deformities noted bilateral R>L.  Muscular strength 5/5 in all lower extremity muscular groups bilateral without pain on range  of motion. No tenderness with calf compression bilateral.  Assessment and Plan: Problem List Items Addressed This Visit   None Visit Diagnoses     Pain due to onychomycosis of toenails of both feet    -  Primary   Diabetic polyneuropathy associated with type 2 diabetes mellitus (HCC)       PAD (peripheral artery disease) ([place])          -Examined patient. -Re-Discussed and educated patient on diabetic foot care, especially with  regards to the vascular, neurological and musculoskeletal systems.  -Mechanically debrided all nails 1-5 bilateral using sterile nail nipper and filed with dremel without incident  -Advised good supportive shoes daily for foot type to accommodate hammertoes -Patient to return  in 2.5 to 3 months for at risk foot care or sooner if problems arise.  Landis Martins, DPM

## 2022-01-09 ENCOUNTER — Encounter: Payer: Self-pay | Admitting: Sports Medicine

## 2022-01-12 ENCOUNTER — Telehealth: Payer: Self-pay | Admitting: Family Medicine

## 2022-01-12 NOTE — Telephone Encounter (Signed)
Crystal from [facility], called in about cgm that was ordered for patients arm , but paitent can't use their arm, so they are asking for cgm for their abdomen

## 2022-01-13 NOTE — Telephone Encounter (Deleted)
New script written and waiting for provider to sign.

## 2022-01-14 NOTE — Telephone Encounter (Signed)
Please advise if it is ok to change to abdomin?

## 2022-01-14 NOTE — Telephone Encounter (Signed)
LMOVM for Crystal to return call.

## 2022-01-14 NOTE — Telephone Encounter (Signed)
Per manufacturer's guidelines - Elenor Legato is only approved for the back of the arm

## 2022-01-15 ENCOUNTER — Ambulatory Visit (INDEPENDENT_AMBULATORY_CARE_PROVIDER_SITE_OTHER): Payer: Medicare Other | Admitting: Family Medicine

## 2022-01-15 ENCOUNTER — Other Ambulatory Visit: Payer: Self-pay

## 2022-01-15 ENCOUNTER — Encounter: Payer: Self-pay | Admitting: Family Medicine

## 2022-01-15 VITALS — BP 135/54 | HR 69 | Temp 98.4°F | Resp 16 | Wt 264.0 lb

## 2022-01-15 DIAGNOSIS — B354 Tinea corporis: Secondary | ICD-10-CM

## 2022-01-15 DIAGNOSIS — Z79899 Other long term (current) drug therapy: Secondary | ICD-10-CM | POA: Diagnosis not present

## 2022-01-15 DIAGNOSIS — E291 Testicular hypofunction: Secondary | ICD-10-CM | POA: Diagnosis not present

## 2022-01-15 MED ORDER — KETOCONAZOLE 2 % EX CREA: 1.0000 "application " | TOPICAL_CREAM | Freq: Two times a day (BID) | CUTANEOUS | 1 refills | Status: DC

## 2022-01-15 NOTE — Progress Notes (Signed)
Established patient visit   Patient: Jerry Parsons   DOB: 07-Oct-1951   71 y.o. Male  MRN: [id number] Visit Date: 01/15/2022  Today's healthcare provider: Lelon Huh, MD   Chief Complaint  Patient presents with   Rash   Subjective    Rash This is a new problem. Episode onset: 2 weeks ago. The problem has been gradually worsening since onset. The affected locations include the left arm (lower forearm spreading to upper arm). The rash is characterized by redness. He was exposed to nothing. Pertinent negatives include no fever, shortness of breath or vomiting. Past treatments include nothing.   It is not painful, but slightly itchy. No known allergic contacts. No history of similar rashes.   Medications: Outpatient Medications Prior to Visit  Medication Sig   amiodarone (PACERONE) 200 MG tablet TAKE 1 TABLET BY MOUTH EVERY DAY   apixaban (ELIQUIS) 5 MG TABS tablet TAKE 1 TABLET TWICE A DAY   Continuous Blood Gluc Sensor MISC 1 each by Does not apply route as directed. Use as directed every 14 days. May dispense FreeStyle [employer] or similar.   cyclobenzaprine (FLEXERIL) 10 MG tablet TAKE 1 TABLET BY MOUTH 3 TIMES A DAY   dutasteride (AVODART) 0.5 MG capsule TAKE 1 CAPSULE DAILY   esomeprazole (NEXIUM) 40 MG capsule Take 1 capsule (40 mg total) by mouth 2 (two) times daily.   FARXIGA 10 MG TABS tablet TAKE 1 TABLET BY MOUTH EVERY DAY   ferrous sulfate 325 (65 FE) MG tablet Take 1 tablet by mouth daily.   fexofenadine (ALLEGRA) 180 MG tablet Take 180 mg by mouth daily.   furosemide (LASIX) 40 MG tablet TAKE 1 TABLET BY MOUTH EVERY DAY   gabapentin (NEURONTIN) 100 MG capsule TAKE 1 CAPSULE BY MOUTH TWICE A DAY   gabapentin (NEURONTIN) 300 MG capsule Take 300 mg by mouth at bedtime.   hydrochlorothiazide (HYDRODIURIL) 12.5 MG tablet Take 25 mg by mouth at bedtime.   insulin aspart (NOVOLOG FLEXPEN) 100 UNIT/ML FlexPen Inject 14 Units into the skin in the morning and at  bedtime.   Insulin Glargine (LANTUS SOLOSTAR [place]) Inject 40 Units into the skin 2 (two) times daily.   Insulin Pen Needle (PEN NEEDLES) 31G X 5 MM MISC 1 each by Does not apply route daily. PATIENT NEEDS NOVA TWIST NEEDLES 5 MM  DX E11.9   JANUVIA 100 MG tablet TAKE 1 TABLET DAILY   LANTUS SOLOSTAR 100 UNIT/ML Solostar Pen Inject 40 Units into the skin 2 (two) times daily.   liraglutide (VICTOZA) 18 MG/3ML SOPN Inject 1.8 mg into the skin daily.   magnesium oxide (MAG-OX) 400 MG tablet TAKE 1 TABLET (400 MG TOTAL) BY MOUTH 2 (TWO) TIMES DAILY.   metoprolol tartrate (LOPRESSOR) 100 MG tablet TAKE 1 TABLET BY MOUTH TWICE A DAY   mupirocin cream (BACTROBAN) 2 % APPLY TO AFFECTED AREA TWICE A DAY   mupirocin ointment (BACTROBAN) 2 % Apply 1 application topically 2 (two) times daily as needed.   Naloxone HCl 0.4 MG/0.4ML SOAJ Inject as needed for suspected overdose.   NOVOFINE PLUS PEN NEEDLE 32G X 4 MM MISC USE AS DIRECTED DAILY   Omega-3 Fatty Acids (FISH OIL) 1200 MG CAPS Take 1,200 mg by mouth 2 (two) times daily.    ONETOUCH ULTRA test strip USE WITH METER TWICE DAILY   oxyCODONE (OXYCONTIN) 40 mg 12 hr tablet Take 1 tablet (40 mg total) by mouth every 8 (eight) hours as  needed.   POTASSIUM PO Take by mouth daily.   pregabalin (LYRICA) 200 MG capsule TAKE 1 CAPSULE 3 TIMES A   DAY   rOPINIRole (REQUIP) 1 MG tablet TAKE 1 TO 2 TABLETS BY MOUTH AT BEDTIME AS NEEDED   rOPINIRole (REQUIP) 2 MG tablet TAKE 1 TABLET BY MOUTH TWICE A DAY AS NEEDED   sucralfate (CARAFATE) 1 g tablet TAKE 1 TABLET BY MOUTH 4 (FOUR) TIMES DAILY - WITH MEALS AND AT BEDTIME.   tamsulosin (FLOMAX) 0.4 MG CAPS capsule Take 0.4 mg by mouth daily.   testosterone cypionate (DEPOTESTOSTERONE CYPIONATE) 200 MG/ML injection Inject 200 mg into the muscle every 21 ( twenty-one) days.    VITAMIN D PO Take by mouth daily at 2 am.   [DISCONTINUED] predniSONE (DELTASONE) 20 MG tablet Take 1 tablet (20 mg total) by mouth daily with  breakfast. (Patient not taking: Reported on 01/15/2022)   No facility-administered medications prior to visit.    Review of Systems  Constitutional:  Negative for appetite change, chills and fever.  Respiratory:  Negative for chest tightness, shortness of breath and wheezing.   Cardiovascular:  Negative for chest pain and palpitations.  Gastrointestinal:  Negative for abdominal pain, nausea and vomiting.  Skin:  Positive for rash.      Objective    BP (!) 135/54 (BP Location: Left Arm, Patient Position: Sitting, Cuff Size: Large)    Pulse 69    Temp 98.4 F (36.9 C) (Oral)    Resp 16    Wt 264 lb (119.7 kg)    SpO2 96% Comment: room air   BMI 36.82 kg/m  {Show previous vital signs (optional):23777}  Physical Exam        Assessment & Plan     1. Tinea corporis (suspected)  - ketoconazole (NIZORAL) 2 % cream; Apply 1 application topically 2 (two) times daily.  Dispense: 30 g; Refill: 1  Call if symptoms change or if not rapidly improving.          The entirety of the information documented in the History of Present Illness, Review of Systems and Physical Exam were personally obtained by me. Portions of this information were initially documented by the CMA and reviewed by me for thoroughness and accuracy.     Lelon Huh, MD  [facility] [phone] (phone) [phone] (fax)  [place]

## 2022-01-20 ENCOUNTER — Other Ambulatory Visit: Payer: Self-pay | Admitting: *Deleted

## 2022-01-20 ENCOUNTER — Other Ambulatory Visit: Payer: Self-pay | Admitting: Family Medicine

## 2022-01-20 MED ORDER — BASAGLAR KWIKPEN 100 UNIT/ML ~~LOC~~ SOPN: 40.0000 [IU] | PEN_INJECTOR | Freq: Two times a day (BID) | SUBCUTANEOUS | 3 refills | Status: DC

## 2022-01-20 NOTE — Telephone Encounter (Signed)
LMOVM for Crystal to return call.

## 2022-01-20 NOTE — Telephone Encounter (Signed)
Requested Prescriptions  Pending Prescriptions Disp Refills   rOPINIRole (REQUIP) 1 MG tablet [Pharmacy Med Name: ROPINIROLE HCL 1 MG TABLET] 180 tablet 1    Sig: TAKE 1-2 TABLETS BY MOUTH AT BEDTIME AS NEEDED     Neurology:  Parkinsonian Agents Passed - 01/20/2022 11:47 AM      Passed - Last BP in normal range    BP Readings from Last 1 Encounters:  01/15/22 (!) 135/54         Passed - Valid encounter within last 12 months    Recent Outpatient Visits          5 days ago Tinea corporis   [facility] Birdie Sons, MD   1 month ago Respiratory tract infection   [employer], Dani Gobble, PA-C   2 months ago Chronic left shoulder pain   [facility] Jerrol Banana., MD   4 months ago Diabetes mellitus type 2, uncontrolled, with complications [facility])   [facility] Jerrol Banana., MD   6 months ago Cellulitis of lower extremity, unspecified laterality   [facility] Jerrol Banana., MD      Future Appointments            In 3 months Gollan, Kathlene November, MD [facility], [facility]

## 2022-01-22 ENCOUNTER — Telehealth: Payer: Self-pay

## 2022-01-22 NOTE — Telephone Encounter (Signed)
Copied from [place] [id number]. Topic: General - Other >> Jan 22, 2022  3:45 PM Tessa Lerner A wrote: Reason for CRM: The patient has, as directed by Dr. Caryn Section, contacted the office to share that the skin irritation on their arm has not improved  The patient shares that the ketoconazole (NIZORAL) 2 % cream [[id number]]  prescription is ineffective and they would like to be prescribed something else  The patient declined to schedule an additional appt and would like to speak with a member of clinical staff when possible  Please contact further

## 2022-01-24 ENCOUNTER — Other Ambulatory Visit: Payer: Self-pay | Admitting: Family Medicine

## 2022-01-24 DIAGNOSIS — G2581 Restless legs syndrome: Secondary | ICD-10-CM

## 2022-01-24 DIAGNOSIS — K219 Gastro-esophageal reflux disease without esophagitis: Secondary | ICD-10-CM

## 2022-01-24 NOTE — Telephone Encounter (Signed)
Requested Prescriptions  Pending Prescriptions Disp Refills   esomeprazole (NEXIUM) 40 MG capsule [Pharmacy Med Name: ESOMEPRA MAG CAP 40MG  DR] 180 capsule 0    Sig: TAKE 1 CAPSULE TWICE DAILY     Gastroenterology: Proton Pump Inhibitors Passed - 01/24/2022  6:10 AM      Passed - Valid encounter within last 12 months    Recent Outpatient Visits          1 week ago Tinea corporis   [facility] Birdie Sons, MD   1 month ago Respiratory tract infection   [employer], Dani Gobble, PA-C   2 months ago Chronic left shoulder pain   [facility] Jerrol Banana., MD   4 months ago Diabetes mellitus type 2, uncontrolled, with complications [facility])   [facility] Jerrol Banana., MD   6 months ago Cellulitis of lower extremity, unspecified laterality   [facility] Jerrol Banana., MD      Future Appointments            In 3 months Gollan, Kathlene November, MD [facility], [facility]

## 2022-01-24 NOTE — Telephone Encounter (Signed)
Requested Prescriptions  Pending Prescriptions Disp Refills   rOPINIRole (REQUIP) 2 MG tablet [Pharmacy Med Name: ROPINIROLE HCL 2 MG TABLET] 180 tablet 0    Sig: TAKE 1 TABLET BY MOUTH TWICE A DAY AS NEEDED     Neurology:  Parkinsonian Agents Passed - 01/24/2022 12:43 AM      Passed - Last BP in normal range    BP Readings from Last 1 Encounters:  01/15/22 (!) 135/54         Passed - Valid encounter within last 12 months    Recent Outpatient Visits          1 week ago Tinea corporis   [place], Kirstie Peri, MD   1 month ago Respiratory tract infection   [employer], Dani Gobble, PA-C   2 months ago Chronic left shoulder pain   [facility] Jerrol Banana., MD   4 months ago Diabetes mellitus type 2, uncontrolled, with complications [facility])   [facility] Jerrol Banana., MD   6 months ago Cellulitis of lower extremity, unspecified laterality   [facility] Jerrol Banana., MD      Future Appointments            In 3 months Gollan, Kathlene November, MD [facility], [facility]

## 2022-01-25 ENCOUNTER — Other Ambulatory Visit: Payer: Self-pay | Admitting: *Deleted

## 2022-01-25 DIAGNOSIS — B354 Tinea corporis: Secondary | ICD-10-CM

## 2022-01-25 MED ORDER — DOXYCYCLINE HYCLATE 100 MG PO TABS: 100.0000 mg | ORAL_TABLET | Freq: Two times a day (BID) | ORAL | 0 refills | Status: DC

## 2022-01-25 NOTE — Telephone Encounter (Signed)
Rx was sent to pharmacy. Patient was advised.

## 2022-01-25 NOTE — Telephone Encounter (Signed)
Patient is requesting that Dr. Caryn Section review message and recommendations and advise if he has any different recommendations.

## 2022-01-25 NOTE — Telephone Encounter (Signed)
It could be a bacterial infections. If it hasn't improved at all then would recommend doxycycline hyclate 100mg  twice a day x 10days. If still not clearing up after that then would need referral to dermatology.

## 2022-01-26 DIAGNOSIS — L309 Dermatitis, unspecified: Secondary | ICD-10-CM | POA: Diagnosis not present

## 2022-01-29 ENCOUNTER — Other Ambulatory Visit: Payer: Self-pay | Admitting: Family Medicine

## 2022-01-29 DIAGNOSIS — M48061 Spinal stenosis, lumbar region without neurogenic claudication: Secondary | ICD-10-CM

## 2022-01-29 NOTE — Telephone Encounter (Signed)
Medication Refill - Medication:oxyCODONE (OXYCONTIN) 40 mg 12 hr tablet   Has the patient contacted their pharmacy? yes (Agent: If no, request that the patient contact the pharmacy for the refill. If patient does not wish to contact the pharmacy document the reason why and proceed with request.) (Agent: If yes, when and what did the pharmacy advise?)contact pcp  Preferred Pharmacy (with phone number or street name): [facility] [place] [place] [facility] [employer] [facility] Phone:  [phone]  Fax:  [phone]     Has the patient been seen for an appointment in the last year OR does the patient have an upcoming appointment? yes  Agent: Please be advised that RX refills may take up to 3 business days. We ask that you follow-up with your pharmacy.

## 2022-01-29 NOTE — Telephone Encounter (Signed)
Requested medication (s) are due for refill today: yes  Requested medication (s) are on the active medication list: yes  Last refill:  11/04/21 #270 0 refills  Future visit scheduled: no   Notes to clinic:  not delegate per protocol do you want to refill Rx?     Requested Prescriptions  Pending Prescriptions Disp Refills   oxyCODONE (OXYCONTIN) 40 mg 12 hr tablet 270 tablet 0    Sig: Take 1 tablet (40 mg total) by mouth every 8 (eight) hours as needed.     Not Delegated - Analgesics:  Opioid Agonists Failed - 01/29/2022 12:10 PM      Failed - This refill cannot be delegated      Failed - Urine Drug Screen completed in last 360 days      Passed - Valid encounter within last 3 months    Recent Outpatient Visits           2 weeks ago Tinea corporis   [facility] Birdie Sons, MD   1 month ago Respiratory tract infection   [place], Dani Gobble, PA-C   2 months ago Chronic left shoulder pain   [facility] Jerrol Banana., MD   4 months ago Diabetes mellitus type 2, uncontrolled, with complications [facility])   [facility] Jerrol Banana., MD   6 months ago Cellulitis of lower extremity, unspecified laterality   [facility] Jerrol Banana., MD       Future Appointments             In 3 months Gollan, Kathlene November, MD [facility], [facility]

## 2022-02-02 MED ORDER — OXYCODONE HCL ER 40 MG PO T12A: 40.0000 mg | EXTENDED_RELEASE_TABLET | Freq: Three times a day (TID) | ORAL | 0 refills | Status: DC | PRN

## 2022-02-07 ENCOUNTER — Other Ambulatory Visit: Payer: Self-pay | Admitting: Family Medicine

## 2022-02-10 DIAGNOSIS — Z20822 Contact with and (suspected) exposure to covid-19: Secondary | ICD-10-CM | POA: Diagnosis not present

## 2022-02-16 ENCOUNTER — Other Ambulatory Visit: Payer: Self-pay | Admitting: Cardiovascular Disease

## 2022-02-16 ENCOUNTER — Other Ambulatory Visit: Payer: Self-pay | Admitting: Family Medicine

## 2022-02-16 DIAGNOSIS — I480 Paroxysmal atrial fibrillation: Secondary | ICD-10-CM

## 2022-02-16 DIAGNOSIS — R110 Nausea: Secondary | ICD-10-CM

## 2022-02-16 NOTE — Telephone Encounter (Signed)
Prescription refill request for Eliquis received. Indication: Atrial Fib Last office visit: 11/13/21  Johnny Bridge MD Scr: 1.28 on 10/22/21 Age: 71 Weight: 119.7kg  Based on above findings Eliquis 5mg  twice daily is the appropriate dose.  Refill approved.

## 2022-02-16 NOTE — Telephone Encounter (Signed)
Refill request

## 2022-02-19 DIAGNOSIS — E291 Testicular hypofunction: Secondary | ICD-10-CM | POA: Diagnosis not present

## 2022-02-23 ENCOUNTER — Encounter: Payer: Medicare Other | Admitting: Family Medicine

## 2022-03-09 ENCOUNTER — Telehealth: Payer: Self-pay | Admitting: Family Medicine

## 2022-03-09 ENCOUNTER — Other Ambulatory Visit: Payer: Self-pay | Admitting: Family Medicine

## 2022-03-09 DIAGNOSIS — K219 Gastro-esophageal reflux disease without esophagitis: Secondary | ICD-10-CM

## 2022-03-09 NOTE — Telephone Encounter (Signed)
Requested medication (s) are due for refill today: both meds early, they are mail order ? ?Requested medication (s) are on the active medication list: yes ? ?Last refill:  Nexium 01/24/21 #180 with 0 RF, Lyrica 09/23/21 #270 with 1 RF ? ?Future visit scheduled: No, pt canceled 02/23/22 appt ? ?Notes to clinic:  labs for Nexium are from 05/2020, Lyrica not delegated, please assess. ? ? ?  ? ?Requested Prescriptions  ?Pending Prescriptions Disp Refills  ? esomeprazole (NEXIUM) 40 MG capsule [Pharmacy Med Name: ESOMEPRA MAG CAP '40MG'$  DR] 180 capsule 0  ?  Sig: TAKE 1 CAPSULE TWICE DAILY  ?  ? Gastroenterology: Proton Pump Inhibitors 2 Failed - 03/09/2022  1:18 AM  ?  ?  Failed - ALT in normal range and within 360 days  ?  ALT  ?Date Value Ref Range Status  ?06/05/2020 28 0 - 44 U/L Final  ? ?SGPT (ALT)  ?Date Value Ref Range Status  ?04/12/2013 29 12 - 78 U/L Final  ?  ?  ?  ?  Failed - AST in normal range and within 360 days  ?  AST  ?Date Value Ref Range Status  ?06/05/2020 26 15 - 41 U/L Final  ? ?SGOT(AST)  ?Date Value Ref Range Status  ?04/12/2013 22 15 - 37 Unit/L Final  ?  ?  ?  ?  Passed - Valid encounter within last 12 months  ?  Recent Outpatient Visits   ? ?      ? 1 month ago Tinea corporis  ? [facility] Birdie Sons, MD  ? 2 months ago Respiratory tract infection  ? [place], PA-C  ? 4 months ago Chronic left shoulder pain  ? [facility] Jerrol Banana., MD  ? 6 months ago Diabetes mellitus type 2, uncontrolled, with complications ([place])  ? [facility] Jerrol Banana., MD  ? 7 months ago Cellulitis of lower extremity, unspecified laterality  ? [facility] Jerrol Banana., MD  ? ?  ?  ?Future Appointments   ? ?        ? In 2 months Gollan, Kathlene November, MD [facility], [facility]  ? ?  ? ?  ?  ?  ? pregabalin (LYRICA) 200 MG capsule [Pharmacy Med Name: PREGABALIN CAP '200MG'$ ] 270 capsule  1  ?  Sig: TAKE 1 CAPSULE 3 TIMES A   DAY  ?  ? Not Delegated - Neurology:  Anticonvulsants - Controlled - pregabalin Failed - 03/09/2022  1:18 AM  ?  ?  Failed - This refill cannot be delegated  ?  ?  Failed - Cr in normal range and within 360 days  ?  Creat  ?Date Value Ref Range Status  ?12/13/2017 1.39 (H) 0.70 - 1.25 mg/dL Final  ?  Comment:  ?  For patients >28 years of age, the reference limit ?for Creatinine is approximately 13% higher for people ?identified as African-American. ?. ?  ? ?Creatinine, Ser  ?Date Value Ref Range Status  ?10/22/2021 1.28 (H) 0.76 - 1.27 mg/dL Final  ? ?Creatinine, Urine  ?Date Value Ref Range Status  ?06/24/2020 73 mg/dL Final  ?  ?  ?  ?  Failed - Completed PHQ-2 or PHQ-9 in the last 360 days  ?  ?  Passed - Valid encounter within last 12 months  ?  Recent Outpatient Visits   ? ?      ?  1 month ago Tinea corporis  ? [facility] Birdie Sons, MD  ? 2 months ago Respiratory tract infection  ? [place], PA-C  ? 4 months ago Chronic left shoulder pain  ? [facility] Jerrol Banana., MD  ? 6 months ago Diabetes mellitus type 2, uncontrolled, with complications ([place])  ? [facility] Jerrol Banana., MD  ? 7 months ago Cellulitis of lower extremity, unspecified laterality  ? [facility] Jerrol Banana., MD  ? ?  ?  ?Future Appointments   ? ?        ? In 2 months Gollan, Kathlene November, MD [facility], [facility]  ? ?  ? ?  ?  ?  ? ? ?

## 2022-03-09 NOTE — Telephone Encounter (Signed)
Copied from [place] [phone] [phone]. Topic: Medicare AWV ?>> Mar 09, 2022 10:08 AM Cher Nakai R wrote: ?Reason for CRM:  ?Left message for patient to call back and schedule Medicare Annual Wellness Visit (AWV) in office.  ? ?If not able to come in office, please offer to do virtually or by telephone.  ? ?Last AWV: 10/31/2019 ? ?Please schedule at anytime with [facility] Health Advisor. ? ?If any questions, please contact me at [phone] ?

## 2022-03-13 DIAGNOSIS — Z20822 Contact with and (suspected) exposure to covid-19: Secondary | ICD-10-CM | POA: Diagnosis not present

## 2022-03-16 DIAGNOSIS — Z20822 Contact with and (suspected) exposure to covid-19: Secondary | ICD-10-CM | POA: Diagnosis not present

## 2022-03-25 DIAGNOSIS — Z20822 Contact with and (suspected) exposure to covid-19: Secondary | ICD-10-CM | POA: Diagnosis not present

## 2022-03-26 ENCOUNTER — Other Ambulatory Visit: Payer: Self-pay | Admitting: Family Medicine

## 2022-03-26 DIAGNOSIS — G629 Polyneuropathy, unspecified: Secondary | ICD-10-CM

## 2022-03-30 DIAGNOSIS — E291 Testicular hypofunction: Secondary | ICD-10-CM | POA: Diagnosis not present

## 2022-04-06 DIAGNOSIS — Z20822 Contact with and (suspected) exposure to covid-19: Secondary | ICD-10-CM | POA: Diagnosis not present

## 2022-04-08 ENCOUNTER — Ambulatory Visit (INDEPENDENT_AMBULATORY_CARE_PROVIDER_SITE_OTHER): Payer: Medicare Other | Admitting: Sports Medicine

## 2022-04-08 ENCOUNTER — Encounter: Payer: Self-pay | Admitting: Sports Medicine

## 2022-04-08 DIAGNOSIS — E1142 Type 2 diabetes mellitus with diabetic polyneuropathy: Secondary | ICD-10-CM

## 2022-04-08 DIAGNOSIS — M79674 Pain in right toe(s): Secondary | ICD-10-CM

## 2022-04-08 DIAGNOSIS — I739 Peripheral vascular disease, unspecified: Secondary | ICD-10-CM

## 2022-04-08 DIAGNOSIS — M79675 Pain in left toe(s): Secondary | ICD-10-CM

## 2022-04-08 DIAGNOSIS — B351 Tinea unguium: Secondary | ICD-10-CM

## 2022-04-08 NOTE — Progress Notes (Signed)
Subjective: ?Jerry Parsons is a 71 y.o. male patient with history of diabetes who presents to office today complaining of long,mildly painful nails while ambulating in shoes; unable to trim.  Patient is assisted by wife this visit. ? ?Last PCP  visit Dr. [name] 2 months ago ?FBS 181 ?A1c 6.2 ?Patient Active Problem List  ? Diagnosis Date Noted  ? Respiratory tract infection 12/17/2021  ? Aortic atherosclerosis ([place]) 10/09/2021  ? Carpal tunnel syndrome of left wrist 09/01/2021  ? Acquired trigger finger of left middle finger 05/28/2021  ? S/P parathyroidectomy ([place]) 07/25/2020  ? Primary hyperparathyroidism ([place])   ? Loss of consciousness ([place]) 06/05/2020  ? Osteoarthritis of both knees 11/11/2018  ? OSA on CPAP 10/14/2018  ? MRSA bacteremia 05/19/2018  ? Lumbar spondylosis 05/05/2018  ? Spinal stenosis of lumbar region 05/05/2018  ? Constipation, chronic 03/08/2018  ? Pain in limb 03/03/2018  ? Gastroesophageal reflux disease without esophagitis 01/04/2018  ? OA (osteoarthritis) of knee 06/20/2017  ? Sepsis due to pneumonia ([place]) 05/07/2017  ? Neuropathy 10/13/2016  ? Amblyopia 12/30/2015  ? Cornea scar 12/30/2015  ? NS (nuclear sclerosis) 12/30/2015  ? Pseudoaphakia 12/30/2015  ? Dehydration 09/08/2015  ? Aspiration pneumonia ([place]) 07/04/2015  ? Paroxysmal atrial fibrillation ([place]) 07/04/2015  ? Arthritis of knee, degenerative 05/28/2015  ? Chronic back pain 09/17/2014  ? Leg edema 05/07/2014  ? Chronic diastolic CHF (congestive heart failure) ([place]) 05/07/2014  ? Campylobacter diarrhea 04/25/2013  ? Atrial flutter ([place]) 01/23/2013  ? Obesity 05/19/2012  ? Hyperlipidemia 11/11/2011  ? Diastolic dysfunction 16/09/9603  ? SOB (shortness of breath) 04/12/2011  ? Diabetes mellitus type 2, uncontrolled, with complications 54/08/8118  ? HYPERTENSION, BENIGN 03/15/2011  ? DVT 03/15/2011  ? TACHYCARDIA 03/15/2011  ? ?Current Outpatient Medications on File Prior to Visit  ?Medication Sig Dispense Refill  ? amiodarone  (PACERONE) 200 MG tablet TAKE 1 TABLET BY MOUTH EVERY DAY 90 tablet 2  ? clobetasol ointment (TEMOVATE) 0.05 % Apply topically 2 (two) times daily.    ? Continuous Blood Gluc Sensor MISC 1 each by Does not apply route as directed. Use as directed every 14 days. May dispense FreeStyle [employer] or similar. 2 each 5  ? cyclobenzaprine (FLEXERIL) 10 MG tablet TAKE 1 TABLET BY MOUTH 3 TIMES A DAY 90 tablet 12  ? doxycycline (VIBRA-TABS) 100 MG tablet Take 1 tablet (100 mg total) by mouth 2 (two) times daily. 20 tablet 0  ? dutasteride (AVODART) 0.5 MG capsule TAKE 1 CAPSULE DAILY 90 capsule 3  ? ELIQUIS 5 MG TABS tablet TAKE 1 TABLET TWICE A DAY 180 tablet 1  ? esomeprazole (NEXIUM) 40 MG capsule TAKE 1 CAPSULE TWICE DAILY 180 capsule 0  ? FARXIGA 10 MG TABS tablet TAKE 1 TABLET BY MOUTH EVERY DAY 90 tablet 3  ? ferrous sulfate 325 (65 FE) MG tablet Take 1 tablet by mouth daily.    ? fexofenadine (ALLEGRA) 180 MG tablet Take 180 mg by mouth daily.    ? furosemide (LASIX) 40 MG tablet TAKE 1 TABLET BY MOUTH EVERY DAY 90 tablet 0  ? gabapentin (NEURONTIN) 100 MG capsule TAKE 1 CAPSULE BY MOUTH TWICE A DAY 180 capsule 1  ? gabapentin (NEURONTIN) 300 MG capsule Take 300 mg by mouth at bedtime.    ? hydrochlorothiazide (HYDRODIURIL) 12.5 MG tablet TAKE 2 TABLETS (25 MG TOTAL) BY MOUTH AT BEDTIME. 180 tablet 1  ? insulin aspart (NOVOLOG FLEXPEN) 100 UNIT/ML FlexPen Inject 14 Units into the  skin in the morning and at bedtime. 15 mL 11  ? Insulin Glargine (BASAGLAR KWIKPEN) 100 UNIT/ML Inject 40 Units into the skin 2 (two) times daily. 45 mL 3  ? Insulin Pen Needle (PEN NEEDLES) 31G X 5 MM MISC 1 each by Does not apply route daily. PATIENT NEEDS NOVA TWIST NEEDLES 5 MM  DX E11.9 200 each 3  ? JANUVIA 100 MG tablet TAKE 1 TABLET DAILY 90 tablet 3  ? ketoconazole (NIZORAL) 2 % cream Apply 1 application topically 2 (two) times daily. 30 g 1  ? liraglutide (VICTOZA) 18 MG/3ML SOPN Inject 1.8 mg into the skin daily. 27 mL  3  ? magnesium oxide (MAG-OX) 400 MG tablet TAKE 1 TABLET (400 MG TOTAL) BY MOUTH 2 (TWO) TIMES DAILY. 180 tablet 3  ? metoprolol tartrate (LOPRESSOR) 100 MG tablet TAKE 1 TABLET BY MOUTH TWICE A DAY 60 tablet 12  ? mupirocin cream (BACTROBAN) 2 % APPLY TO AFFECTED AREA TWICE A DAY (Patient not taking: Reported on 01/15/2022) 30 g 0  ? mupirocin ointment (BACTROBAN) 2 % Apply 1 application topically 2 (two) times daily as needed. (Patient not taking: Reported on 01/15/2022) 30 g 0  ? Naloxone HCl 0.4 MG/0.4ML SOAJ Inject as needed for suspected overdose. (Patient not taking: Reported on 01/15/2022) 0.4 mL 2  ? NOVOFINE PLUS PEN NEEDLE 32G X 4 MM MISC USE AS DIRECTED DAILY 200 each 11  ? Omega-3 Fatty Acids (FISH OIL) 1200 MG CAPS Take 1,200 mg by mouth 2 (two) times daily.     ? ONETOUCH ULTRA test strip USE WITH METER TWICE DAILY 100 strip 6  ? oxyCODONE (OXYCONTIN) 40 mg 12 hr tablet Take 1 tablet (40 mg total) by mouth every 8 (eight) hours as needed. 270 tablet 0  ? POTASSIUM PO Take by mouth daily.    ? pregabalin (LYRICA) 200 MG capsule TAKE 1 CAPSULE 3 TIMES A   DAY 270 capsule 1  ? rOPINIRole (REQUIP) 1 MG tablet TAKE 1-2 TABLETS BY MOUTH AT BEDTIME AS NEEDED 180 tablet 1  ? rOPINIRole (REQUIP) 2 MG tablet TAKE 1 TABLET BY MOUTH TWICE A DAY AS NEEDED 180 tablet 0  ? sucralfate (CARAFATE) 1 g tablet TAKE 1 TABLET BY MOUTH 4 (FOUR) TIMES DAILY - WITH MEALS AND AT BEDTIME. 360 tablet 1  ? tamsulosin (FLOMAX) 0.4 MG CAPS capsule Take 0.4 mg by mouth daily.    ? testosterone cypionate (DEPOTESTOSTERONE CYPIONATE) 200 MG/ML injection Inject 200 mg into the muscle every 21 ( twenty-one) days.     ? VITAMIN D PO Take by mouth daily at 2 am.    ? ?No current facility-administered medications on file prior to visit.  ? ?Allergies  ?Allergen Reactions  ? Carisoprodol Itching  ? Tamsulosin   ?  Pt stated, "upsets my stomach"  ? ? ?Objective: ?General: Patient is awake, alert, and oriented x 3 and in no acute  distress. ? ?Integument: Skin is warm, dry and supple bilateral. Nails are tender, long, thickened and dystrophic with subungual debris, consistent with onychomycosis, 1-5 bilateral. No open lesions or preulcerative lesions present bilateral. Remaining integument unremarkable. . ? ?Vasculature:  Dorsalis Pedis pulse 0/4 bilateral. Posterior Tibial pulse  0/4 bilateral. Capillary fill time <3 sec 1-5 bilateral. No hair growth to the level of the digits.Temperature gradient within normal limits. No varicosities present bilateral. 1+ pitting edema present bilateral. +Stucco dermatitis due to PVD without infection.  ? ?Neurology: The patient has absent sensation measured with  a 5.07/10g Semmes Weinstein Monofilament at all pedal sites bilateral, unchanged from prior ? ?Musculoskeletal: Asymptomatic hammertoes pedal deformities noted bilateral R>L.  Muscular strength 5/5 in all lower extremity muscular groups bilateral without pain on range of motion. No tenderness with calf compression bilateral. ? ?Assessment and Plan: ?Problem List Items Addressed This Visit   ?None ?Visit Diagnoses   ? ? Pain due to onychomycosis of toenails of both feet    -  Primary  ? Diabetic polyneuropathy associated with type 2 diabetes mellitus ([place])      ? PAD (peripheral artery disease) ([place])      ? ?  ? ?-Examined patient. ?-Re-Discussed and educated patient on diabetic foot care, especially with  ?regards to the vascular, neurological and musculoskeletal systems.  ?-Mechanically debrided all painful nails 1-5 bilateral using sterile nail nipper and filed with dremel without incident  ?-Advised good supportive shoes daily for foot type to accommodate hammertoes like before and encouraged elevation of legs to assist with edema control ?-Patient to return  in 2.5 to 3 months for at risk foot care or sooner if problems arise.  ?Landis Martins, DPM ?

## 2022-04-09 ENCOUNTER — Other Ambulatory Visit: Payer: Self-pay | Admitting: Medical

## 2022-04-13 ENCOUNTER — Other Ambulatory Visit: Payer: Self-pay | Admitting: Family Medicine

## 2022-04-13 DIAGNOSIS — G2581 Restless legs syndrome: Secondary | ICD-10-CM

## 2022-04-13 NOTE — Telephone Encounter (Signed)
Requested Prescriptions  ?Pending Prescriptions Disp Refills  ?? rOPINIRole (REQUIP) 2 MG tablet [Pharmacy Med Name: ROPINIROLE HCL 2 MG TABLET] 180 tablet 0  ?  Sig: TAKE 1 TABLET BY MOUTH TWICE A DAY AS NEEDED  ?  ? Neurology:  Parkinsonian Agents Passed - 04/13/2022  3:06 PM  ?  ?  Passed - Last BP in normal range  ?  BP Readings from Last 1 Encounters:  ?01/15/22 (!) 135/54  ?   ?  ?  Passed - Last Heart Rate in normal range  ?  Pulse Readings from Last 1 Encounters:  ?01/15/22 69  ?   ?  ?  Passed - Valid encounter within last 12 months  ?  Recent Outpatient Visits   ?      ? 2 months ago Tinea corporis  ? [facility] Birdie Sons, MD  ? 3 months ago Respiratory tract infection  ? [employer], Erin E, PA-C  ? 5 months ago Chronic left shoulder pain  ? [facility] Jerrol Banana., MD  ? 7 months ago Diabetes mellitus type 2, uncontrolled, with complications ([place])  ? [facility] Jerrol Banana., MD  ? 8 months ago Cellulitis of lower extremity, unspecified laterality  ? [facility] Jerrol Banana., MD  ?  ?  ?Future Appointments   ?        ? In 3 weeks Jerrol Banana., MD [facility], [facility]  ? In 1 month Gollan, Kathlene November, MD [facility], [facility]  ?  ? ?  ?  ?  ? ?

## 2022-04-15 ENCOUNTER — Telehealth: Payer: Self-pay | Admitting: Family Medicine

## 2022-04-15 DIAGNOSIS — E118 Type 2 diabetes mellitus with unspecified complications: Secondary | ICD-10-CM

## 2022-04-15 NOTE — Telephone Encounter (Signed)
Pt received an Rx for [employer] but needs RX for [employer] 2 Sensor instead / please advise and [facility]  [id number]  [phone] ?Fax:  [phone] ?  ?  ?Freestyle Libre 2 Sensor is not on pts current med profile. Please advise, thank you. ?

## 2022-04-15 NOTE — Telephone Encounter (Signed)
Pt received an Rx for [employer] but needs RX for [employer] 2 Sensor instead / please advise and send to  ?[id number]  [phone] ?Fax:  [phone] ?  ? ?

## 2022-04-16 MED ORDER — FREESTYLE LIBRE 2 SENSOR MISC: 1.0000 | Freq: Once | 2 refills | Status: DC

## 2022-04-16 NOTE — Telephone Encounter (Signed)
Done

## 2022-04-20 DIAGNOSIS — Z20822 Contact with and (suspected) exposure to covid-19: Secondary | ICD-10-CM | POA: Diagnosis not present

## 2022-04-24 ENCOUNTER — Other Ambulatory Visit: Payer: Self-pay | Admitting: Family Medicine

## 2022-04-26 ENCOUNTER — Telehealth: Payer: Self-pay | Admitting: Family Medicine

## 2022-04-26 DIAGNOSIS — Z20822 Contact with and (suspected) exposure to covid-19: Secondary | ICD-10-CM | POA: Diagnosis not present

## 2022-04-26 NOTE — Telephone Encounter (Signed)
Left message for patient to call back and schedule Medicare Annual Wellness Visit (AWV) in office.  ? ?If unable to come into the office for AWV,  please offer to do virtually or by telephone. ? ?Last AWV: 10/31/2019 ? ?Please schedule at anytime with [facility] Health Advisor. ? ?30 minute appointment for Virtual or phone ?45 minute appointment for in office or Initial virtual/phone ? ?Any questions, please contact me at [phone]   ?

## 2022-04-27 DIAGNOSIS — M170 Bilateral primary osteoarthritis of knee: Secondary | ICD-10-CM | POA: Diagnosis not present

## 2022-04-27 DIAGNOSIS — Z20822 Contact with and (suspected) exposure to covid-19: Secondary | ICD-10-CM | POA: Diagnosis not present

## 2022-04-27 DIAGNOSIS — Z96651 Presence of right artificial knee joint: Secondary | ICD-10-CM | POA: Diagnosis not present

## 2022-04-27 DIAGNOSIS — M25562 Pain in left knee: Secondary | ICD-10-CM | POA: Diagnosis not present

## 2022-04-28 ENCOUNTER — Other Ambulatory Visit: Payer: Self-pay | Admitting: Physician Assistant

## 2022-04-28 DIAGNOSIS — Z96651 Presence of right artificial knee joint: Secondary | ICD-10-CM

## 2022-04-29 DIAGNOSIS — Z20828 Contact with and (suspected) exposure to other viral communicable diseases: Secondary | ICD-10-CM | POA: Diagnosis not present

## 2022-04-29 DIAGNOSIS — Z20822 Contact with and (suspected) exposure to covid-19: Secondary | ICD-10-CM | POA: Diagnosis not present

## 2022-04-30 DIAGNOSIS — Z20828 Contact with and (suspected) exposure to other viral communicable diseases: Secondary | ICD-10-CM | POA: Diagnosis not present

## 2022-05-05 ENCOUNTER — Ambulatory Visit: Payer: Medicare Other | Admitting: Family Medicine

## 2022-05-05 NOTE — Progress Notes (Deleted)
Established patient visit   Patient: Jerry Parsons   DOB: 03-Apr-1951   71 y.o. Male  MRN: [id number] Visit Date: 05/05/2022  Today's healthcare provider: [name], MD   No chief complaint on file.  Subjective    HPI  Follow up for Chronic left shoulder pain:  The patient was last seen for this 6 months ago. Changes made at last visit include; At this time will treat with prednisone 20 mg daily for a week and refer to Ortho.  -----------------------------------------------------------------------------------------   Medications: Outpatient Medications Prior to Visit  Medication Sig   amiodarone (PACERONE) 200 MG tablet TAKE 1 TABLET BY MOUTH EVERY DAY   clobetasol ointment (TEMOVATE) 0.05 % Apply topically 2 (two) times daily.   cyclobenzaprine (FLEXERIL) 10 MG tablet TAKE 1 TABLET BY MOUTH 3 TIMES A DAY   doxycycline (VIBRA-TABS) 100 MG tablet Take 1 tablet (100 mg total) by mouth 2 (two) times daily.   dutasteride (AVODART) 0.5 MG capsule TAKE 1 CAPSULE DAILY   ELIQUIS 5 MG TABS tablet TAKE 1 TABLET TWICE A DAY   esomeprazole (NEXIUM) 40 MG capsule TAKE 1 CAPSULE TWICE DAILY   FARXIGA 10 MG TABS tablet TAKE 1 TABLET BY MOUTH EVERY DAY   ferrous sulfate 325 (65 FE) MG tablet Take 1 tablet by mouth daily.   fexofenadine (ALLEGRA) 180 MG tablet Take 180 mg by mouth daily.   furosemide (LASIX) 40 MG tablet TAKE 1 TABLET BY MOUTH EVERY DAY   gabapentin (NEURONTIN) 100 MG capsule TAKE 1 CAPSULE BY MOUTH TWICE A DAY   gabapentin (NEURONTIN) 300 MG capsule Take 300 mg by mouth at bedtime.   hydrochlorothiazide (HYDRODIURIL) 12.5 MG tablet TAKE 2 TABLETS (25 MG TOTAL) BY MOUTH AT BEDTIME.   Insulin Glargine (BASAGLAR KWIKPEN) 100 UNIT/ML Inject 40 Units into the skin 2 (two) times daily.   Insulin Pen Needle (PEN NEEDLES) 31G X 5 MM MISC 1 each by Does not apply route daily. PATIENT NEEDS NOVA TWIST NEEDLES 5 MM  DX E11.9   JANUVIA 100 MG tablet TAKE 1 TABLET DAILY    ketoconazole (NIZORAL) 2 % cream Apply 1 application topically 2 (two) times daily.   magnesium oxide (MAG-OX) 400 MG tablet TAKE 1 TABLET (400 MG TOTAL) BY MOUTH 2 (TWO) TIMES DAILY.   metoprolol tartrate (LOPRESSOR) 100 MG tablet TAKE 1 TABLET BY MOUTH TWICE A DAY   mupirocin cream (BACTROBAN) 2 % APPLY TO AFFECTED AREA TWICE A DAY (Patient not taking: Reported on 01/15/2022)   mupirocin ointment (BACTROBAN) 2 % Apply 1 application topically 2 (two) times daily as needed. (Patient not taking: Reported on 01/15/2022)   Naloxone HCl 0.4 MG/0.4ML SOAJ Inject as needed for suspected overdose. (Patient not taking: Reported on 01/15/2022)   NOVOFINE PLUS PEN NEEDLE 32G X 4 MM MISC USE AS DIRECTED DAILY   NOVOLOG FLEXPEN 100 UNIT/ML FlexPen INJECT 14 UNITS            SUBCUTANEOUSLY IN THE      MORNING AND AT BEDTIME   Omega-3 Fatty Acids (FISH OIL) 1200 MG CAPS Take 1,200 mg by mouth 2 (two) times daily.    ONETOUCH ULTRA test strip USE WITH METER TWICE DAILY   oxyCODONE (OXYCONTIN) 40 mg 12 hr tablet Take 1 tablet (40 mg total) by mouth every 8 (eight) hours as needed.   POTASSIUM PO Take by mouth daily.   pregabalin (LYRICA) 200 MG capsule TAKE 1 CAPSULE 3 TIMES A  DAY   rOPINIRole (REQUIP) 1 MG tablet TAKE 1-2 TABLETS BY MOUTH AT BEDTIME AS NEEDED   rOPINIRole (REQUIP) 2 MG tablet TAKE 1 TABLET BY MOUTH TWICE A DAY AS NEEDED   sucralfate (CARAFATE) 1 g tablet TAKE 1 TABLET BY MOUTH 4 (FOUR) TIMES DAILY - WITH MEALS AND AT BEDTIME.   tamsulosin (FLOMAX) 0.4 MG CAPS capsule Take 0.4 mg by mouth daily.   testosterone cypionate (DEPOTESTOSTERONE CYPIONATE) 200 MG/ML injection Inject 200 mg into the muscle every 21 ( twenty-one) days.    VICTOZA 18 MG/3ML SOPN INJECT 1.'8MG'$  SUBCUTANEOUSLYDAILY   VITAMIN D PO Take by mouth daily at 2 am.   No facility-administered medications prior to visit.    Review of Systems  Constitutional:  Negative for appetite change, chills and fever.  Respiratory:   Negative for chest tightness, shortness of breath and wheezing.   Cardiovascular:  Negative for chest pain and palpitations.  Gastrointestinal:  Negative for abdominal pain, nausea and vomiting.   {Labs  Heme  Chem  Endocrine  Serology  Results Review (optional):23779}   Objective    There were no vitals taken for this visit. {Show previous vital signs (optional):23777}  Physical Exam  ***  No results found for any visits on 05/05/22.  Assessment & Plan     ***  No follow-ups on file.      {provider attestation***:1}   [name], MD  [facility] [phone] (phone) [phone] (fax)  [place]

## 2022-05-12 NOTE — Progress Notes (Signed)
Cardiology Office Note  Date:  05/14/2022   ID:  Jerry Parsons, DOB [birth year], MRN [id number]  PCP:  Jerrol Banana., MD   Chief Complaint  Patient presents with   6 month follow up     Patient c/o bilateral LE edema. Medications reviewed by the patient verbally.     HPI:  Jerry Parsons is a  71 year old gentleman, multiple back surgeries,  spinal stenosis , s/p arthroscopic knee surgery, total knee replacement left obesity,  poorly controlled diabetes ,  DVT, PE in March 2011 previously on warfarin,  30 year smoking history,   neuropathy hypertension,  depression,  renal disease,  obstructive sleep apnea on CPAP,  chronic sweating at nighttime which he attributes to the Vicodin. Prior severe pneumonia, ATN Remote atrial flutter dating back to 2014, fibrillation, last episode in 2015.  Flutter with cardioversion 08/2020 presenting for routine followup of his hypertension , atrial flutter  Last seen by myself in clinic November 2022  Bad back pain, multiple surgeries Recently got back from the beach, may have overdone it Feels he has leg swelling, remains on Lasix 40 daily Nonpitting Does not wear compression  No chest pain or shortness of breath concerning for angina Denies any tachypalpitations concerning for arrhythmia  Labs from 6 months ago A1c 7.3, creatinine 1.28 potassium 4.1  EKG personally reviewed by myself on todays visit Normal sinus rhythm rate 68 bpm no significant ST-T wave changes  Other past medical history reviewed  Echocardiogram June 2021  mild LVH otherwise normal LV size and function   total cholesterol 134 LDL 67   hospitalization June 05, 2020  atrial flutter Underwent cardioversion, 09/11/2020 NSR restored  Chronic back pain 9 back surgeries, previously thought about  spinal cord stimulator  Admission 5/18 for sepsis, salmonella diarrhea, per the discharge summary Hospital records reviewed with the patient in detail From  eggs, per the patient He did not receive antibiotics  pneumonia while at the beach January 2017 He was in ICU for 3 days, long recovery Lost more than 30 pounds,  Lipid panel done while he was very ill early 2017, total cholesterol at that time 89  Denies any atrial fibrillation through his hospital course, reports he was changed to amiodarone IV infusion and then back to pill when he was tolerating oral medications  Previous event where he had buckling of his knee, fall, contusion, TIA-type symptoms that took him to the hospital. He was kept overnight and most of his workup was relatively unrevealing including MRI/MRA, carotid ultrasound, echocardiogram.   History of atrial flutter, status post cardioversion on 03/14/2013.   2 admissions to the hospital for gastroenteritis/ campylobacter infection. Required a long course of antibiotics. Possible  infection from tainted chicken.   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  PSH:    Past Surgical History:  Procedure Laterality Date   BACK SURGERY     x 8; upper x 3 & lower x 5   CARDIOVERSION  03/14/13, 10/16   2014 - [place], 2016 - [place]   CARDIOVERSION N/A 09/11/2020   Procedure: CARDIOVERSION;  Surgeon: Minna Merritts, MD;  Location:  [facility] ORS;  Service: Cardiovascular;  Laterality: N/A;   CATARACT EXTRACTION W/PHACO Left 10/29/2015   Procedure: CATARACT EXTRACTION PHACO AND INTRAOCULAR LENS PLACEMENT (IOC);  Surgeon: Leandrew Koyanagi, MD;   Location: [place];  Service: Ophthalmology;  Laterality: Left;  DIABETIC - insulin and oral medsSleep apnea - no machine   CHOLECYSTECTOMY     COLONOSCOPY WITH PROPOFOL N/A 01/16/2018   Procedure: COLONOSCOPY WITH PROPOFOL;  Surgeon: Manya Silvas, MD;  Location: [facility] ENDOSCOPY;  Service: Endoscopy;  Laterality: N/A;   ESOPHAGOGASTRODUODENOSCOPY (EGD) WITH PROPOFOL N/A 01/16/2018   Procedure: ESOPHAGOGASTRODUODENOSCOPY (EGD) WITH PROPOFOL;  Surgeon: Manya Silvas, MD;  Location: [facility] ENDOSCOPY;  Service: Endoscopy;  Laterality: N/A;   EYE SURGERY     GALLBLADDER SURGERY  2002   JOINT REPLACEMENT Right 2018   KNEE ARTHROSCOPY     left    PARATHYROIDECTOMY N/A 07/25/2020   Procedure: PARATHYROIDECTOMY;  Surgeon: Fredirick Maudlin, MD;  Location: [facility] ORS;  Service: General;  Laterality: N/A;   ROTATOR CUFF REPAIR  2001   left    TEE WITHOUT CARDIOVERSION N/A 04/26/2018   Procedure: TRANSESOPHAGEAL ECHOCARDIOGRAM (TEE);  Surgeon: Nelva Bush, MD;  Location: [facility] ORS;  Service: Cardiovascular;  Laterality: N/A;   TONSILLECTOMY     TOTAL KNEE ARTHROPLASTY Right 06/20/2017   Procedure: RIGHT TOTAL KNEE ARTHROPLASTY;  Surgeon: Gaynelle Arabian, MD;  Location: [facility] ORS;  Service: Orthopedics;  Laterality: Right;    Current Outpatient Medications  Medication Sig Dispense Refill   amiodarone (PACERONE) 200 MG tablet TAKE 1 TABLET BY MOUTH EVERY DAY 90 tablet 2   Apoaequorin (PREVAGEN) 10 MG CAPS Take 10 mg by mouth daily.     aspirin EC 81 MG tablet Take 81 mg by mouth daily. Swallow whole.     cyclobenzaprine (FLEXERIL) 10 MG tablet TAKE 1 TABLET BY MOUTH 3 TIMES A DAY 90 tablet 12   dutasteride (AVODART) 0.5 MG capsule TAKE 1 CAPSULE DAILY 90 capsule 3   ELIQUIS 5 MG TABS tablet TAKE 1 TABLET TWICE A DAY 180 tablet 1   esomeprazole (NEXIUM) 40 MG capsule TAKE 1 CAPSULE TWICE DAILY 180 capsule 0   FARXIGA 10 MG TABS tablet TAKE 1 TABLET BY MOUTH EVERY DAY 90 tablet 3   ferrous  sulfate 325 (65 FE) MG tablet Take 1 tablet by mouth daily.     fexofenadine (ALLEGRA) 180 MG tablet Take 180 mg by mouth daily.     fexofenadine (ALLEGRA) 180 MG tablet Take 180 mg by mouth daily.     furosemide (LASIX) 40 MG tablet TAKE 1 TABLET BY MOUTH EVERY DAY 90 tablet 0   gabapentin (NEURONTIN) 100 MG capsule TAKE 1 CAPSULE BY MOUTH TWICE A DAY 180 capsule 1   gabapentin (NEURONTIN) 300 MG capsule Take 300 mg by mouth at bedtime.     Insulin Glargine (BASAGLAR KWIKPEN) 100 UNIT/ML Inject 40 Units into the skin 2 (two) times daily. 45 mL 3   JANUVIA 100 MG tablet TAKE 1 TABLET DAILY 90 tablet 3   magnesium oxide (MAG-OX) 400 MG tablet TAKE 1 TABLET (400 MG TOTAL) BY MOUTH 2 (TWO) TIMES DAILY. 180 tablet 3   metoprolol tartrate (LOPRESSOR) 100 MG tablet TAKE 1 TABLET BY MOUTH TWICE A DAY 60 tablet 12   NOVOLOG FLEXPEN 100 UNIT/ML FlexPen INJECT 14 UNITS            SUBCUTANEOUSLY IN THE      MORNING AND AT BEDTIME 15 mL 6   Omega-3 Fatty  Acids (FISH OIL) 1200 MG CAPS Take 1,200 mg by mouth 2 (two) times daily.      oxyCODONE (OXYCONTIN) 40 mg 12 hr tablet Take 1 tablet (40 mg total) by mouth every 8 (eight) hours as needed. 270 tablet 0   pregabalin (LYRICA) 200 MG capsule TAKE 1 CAPSULE 3 TIMES A   DAY 270 capsule 1   rOPINIRole (REQUIP) 1 MG tablet TAKE 1-2 TABLETS BY MOUTH AT BEDTIME AS NEEDED 180 tablet 1   rOPINIRole (REQUIP) 2 MG tablet TAKE 1 TABLET BY MOUTH TWICE A DAY AS NEEDED 180 tablet 0   sucralfate (CARAFATE) 1 g tablet TAKE 1 TABLET BY MOUTH 4 (FOUR) TIMES DAILY - WITH MEALS AND AT BEDTIME. 360 tablet 1   tamsulosin (FLOMAX) 0.4 MG CAPS capsule Take 0.4 mg by mouth daily.     testosterone cypionate (DEPOTESTOSTERONE CYPIONATE) 200 MG/ML injection Inject 200 mg into the muscle every 21 ( twenty-one) days.      VICTOZA 18 MG/3ML SOPN INJECT 1.'8MG'$  SUBCUTANEOUSLYDAILY 27 mL 3   VITAMIN D PO Take by mouth daily at 2 am.     clobetasol ointment (TEMOVATE) 0.05 % Apply topically 2  (two) times daily. (Patient not taking: Reported on 05/14/2022)     doxycycline (VIBRA-TABS) 100 MG tablet Take 1 tablet (100 mg total) by mouth 2 (two) times daily. (Patient not taking: Reported on 05/14/2022) 20 tablet 0   Insulin Pen Needle (PEN NEEDLES) 31G X 5 MM MISC 1 each by Does not apply route daily. PATIENT NEEDS NOVA TWIST NEEDLES 5 MM  DX E11.9 (Patient not taking: Reported on 05/14/2022) 200 each 3   ketoconazole (NIZORAL) 2 % cream Apply 1 application topically 2 (two) times daily. (Patient not taking: Reported on 05/14/2022) 30 g 1   mupirocin cream (BACTROBAN) 2 % APPLY TO AFFECTED AREA TWICE A DAY (Patient not taking: Reported on 01/15/2022) 30 g 0   mupirocin ointment (BACTROBAN) 2 % Apply 1 application topically 2 (two) times daily as needed. (Patient not taking: Reported on 01/15/2022) 30 g 0   Naloxone HCl 0.4 MG/0.4ML SOAJ Inject as needed for suspected overdose. (Patient not taking: Reported on 01/15/2022) 0.4 mL 2   NOVOFINE PLUS PEN NEEDLE 32G X 4 MM MISC USE AS DIRECTED DAILY (Patient not taking: Reported on 05/14/2022) 200 each 11   ONETOUCH ULTRA test strip USE WITH METER TWICE DAILY (Patient not taking: Reported on 05/14/2022) 100 strip 6   No current facility-administered medications for this visit.    Allergies:   Carisoprodol and Tamsulosin   Social History:  The patient  reports that he quit smoking about 32 years ago. His smoking use included cigarettes. He has a 40.00 pack-year smoking history. He has never used smokeless tobacco. He reports that he does not drink alcohol and does not use drugs.   Family History:   family history includes Heart attack in his brother.    Review of Systems: Review of Systems  Constitutional: Negative.   HENT: Negative.    Respiratory: Negative.    Cardiovascular: Negative.   Gastrointestinal: Negative.   Musculoskeletal:  Positive for back pain and joint pain.       Tingling foot pain  Neurological: Negative.    Psychiatric/Behavioral: Negative.    All other systems reviewed and are negative.  PHYSICAL EXAM: VS:  BP 130/62 (BP Location: Left Arm, Patient Position: Sitting, Cuff Size: Normal)   Pulse 68   Ht '5\' 11"'$  (1.803 m)   Wt 263  lb 8 oz (119.5 kg)   SpO2 97%   BMI 36.75 kg/m  , BMI Body mass index is 36.75 kg/m. Constitutional:  oriented to person, place, and time. No distress.  HENT:  Head: Grossly normal Eyes:  no discharge. No scleral icterus.  Neck: No JVD, no carotid bruits  Cardiovascular: Regular rate and rhythm, no murmurs appreciated No significant lower extremity edema Pulmonary/Chest: Clear to auscultation bilaterally, no wheezes or rails Abdominal: Soft.  no distension.  no tenderness.  Musculoskeletal: Normal range of motion Neurological:  normal muscle tone. Coordination normal. No atrophy Skin: Skin warm and dry, brawny lower extremity Psychiatric: normal affect, pleasant   Recent Labs: 05/14/2022: BUN 27; Creatinine, Ser 1.42; Potassium 4.3; Sodium 143    Lipid Panel Lab Results  Component Value Date   CHOL 134 05/15/2020   HDL 29 (L) 05/15/2020   LDLCALC 67 05/15/2020   TRIG 233 (H) 05/15/2020      Wt Readings from Last 3 Encounters:  05/14/22 263 lb 8 oz (119.5 kg)  01/15/22 264 lb (119.7 kg)  12/17/21 267 lb (121.1 kg)     ASSESSMENT AND PLAN:  Atrial flutter, unspecified type ([place]) - Plan: EKG 12-Lead Maintaining normal sinus rhythm,prior cardioversion On  anticoagulation/Eliquis, metoprolol, amiodarone No changes to his regimen  HYPERTENSION, BENIGN -  Blood pressure is well controlled on today's visit. No changes made to the medications.  Paroxysmal atrial fibrillation (HCC) - Plan: EKG 12-Lead On amiodarone and Eliquis, metoprolol atrial flutter on EKG since June 2021 Maintaining normal sinus rhythm last several office visits No changes made  Hyperlipidemia Cholesterol is at goal on the current lipid regimen. No changes to the  medications were made.  Chronic diastolic CHF (congestive heart failure) (HCC) appears euvolemic On Lasix 40 daily, BMP today No significant leg swelling, brawny discoloration noted from chronic swelling in the past  Uncontrolled type 2 diabetes mellitus with complication, with long-term current Slow improvement over the past 2 to 3 years with weight drop repeat labs today  Primary osteoarthritis of both knees Limited mobility secondary to back and leg discomfort, knee pain Discussed seeking advice on spinal cord stimulator  Leg swelling Significant venous component, recommended compression hose, leg elevation  Neuropathy, chronic back pain chronic pain History of fusions   Total encounter time more than 30 minutes  Greater than 50% was spent in counseling and coordination of care with the patient    Orders Placed This Encounter  Procedures   Basic Metabolic Panel (BMET)   HgB A1c   EKG 12-Lead     Signed, Esmond Plants, M.D., Ph.D. 05/14/2022  [place], [place]

## 2022-05-14 ENCOUNTER — Encounter: Payer: Self-pay | Admitting: Cardiovascular Disease

## 2022-05-14 ENCOUNTER — Other Ambulatory Visit
Admission: RE | Admit: 2022-05-14 | Discharge: 2022-05-14 | Disposition: A | Discharge status: Home / Self Care | Payer: Medicare Other | Attending: Cardiovascular Disease | Admitting: Cardiovascular Disease

## 2022-05-14 ENCOUNTER — Ambulatory Visit (INDEPENDENT_AMBULATORY_CARE_PROVIDER_SITE_OTHER): Payer: Medicare Other | Admitting: Cardiovascular Disease

## 2022-05-14 VITALS — BP 130/62 | HR 68 | Ht 71.0 in | Wt 263.5 lb

## 2022-05-14 DIAGNOSIS — N183 Chronic kidney disease, stage 3 unspecified: Secondary | ICD-10-CM

## 2022-05-14 DIAGNOSIS — R739 Hyperglycemia, unspecified: Secondary | ICD-10-CM | POA: Diagnosis not present

## 2022-05-14 DIAGNOSIS — I700 Atherosclerosis of aorta: Secondary | ICD-10-CM | POA: Diagnosis not present

## 2022-05-14 DIAGNOSIS — I483 Typical atrial flutter: Secondary | ICD-10-CM | POA: Diagnosis not present

## 2022-05-14 DIAGNOSIS — I480 Paroxysmal atrial fibrillation: Secondary | ICD-10-CM

## 2022-05-14 DIAGNOSIS — I10 Essential (primary) hypertension: Secondary | ICD-10-CM | POA: Diagnosis not present

## 2022-05-14 DIAGNOSIS — I5032 Chronic diastolic (congestive) heart failure: Secondary | ICD-10-CM | POA: Diagnosis not present

## 2022-05-14 DIAGNOSIS — E782 Mixed hyperlipidemia: Secondary | ICD-10-CM | POA: Diagnosis not present

## 2022-05-14 LAB — BASIC METABOLIC PANEL
Anion gap: 9 (ref 5–15)
BUN: 27 mg/dL — ABNORMAL HIGH (ref 8–23)
CO2: 33 mmol/L — ABNORMAL HIGH (ref 22–32)
Calcium: 8.7 mg/dL — ABNORMAL LOW (ref 8.9–10.3)
Chloride: 101 mmol/L (ref 98–111)
Creatinine, Ser: 1.42 mg/dL — ABNORMAL HIGH (ref 0.61–1.24)
GFR, Estimated: 53 mL/min — ABNORMAL LOW (ref >60)
Glucose, Bld: 118 mg/dL — ABNORMAL HIGH (ref 70–99)
Potassium: 4.3 mmol/L (ref 3.5–5.1)
Sodium: 143 mmol/L (ref 135–145)

## 2022-05-14 LAB — HEMOGLOBIN A1C
Hgb A1c MFr Bld: 7.2 % — ABNORMAL HIGH (ref 4.8–5.6)
Mean Plasma Glucose: 159.94 mg/dL

## 2022-05-14 NOTE — Patient Instructions (Addendum)
Medication Instructions:  No changes  If you need a refill on your cardiac medications before your next appointment, please call your pharmacy.   Lab work:  BMP, A1c today in hospital  Testing/Procedures: No new testing needed  Follow-Up: At [facility], you and your health needs are our priority.  As part of our continuing mission to provide you with exceptional heart care, we have created designated Provider Care Teams.  These Care Teams include your primary Cardiologist (physician) and Advanced Practice Providers (APPs -  Physician Assistants and Nurse Practitioners) who all work together to provide you with the care you need, when you need it.  You will need a follow up appointment in 12 months  Providers on your designated Care Team:   Murray Hodgkins, NP Christell Faith, PA-C Cadence Kathlen Mody, [place]  COVID-19 Vaccine Information can be found at: [email or web address] For questions related to vaccine distribution or appointments, please email [email or web address] or call [phone].

## 2022-05-17 ENCOUNTER — Encounter
Admission: RE | Admit: 2022-05-17 | Discharge: 2022-05-17 | Disposition: A | Discharge status: Home / Self Care | Payer: Medicare Other | Source: Ambulatory Visit | Attending: Family Medicine | Admitting: Family Medicine

## 2022-05-17 ENCOUNTER — Encounter
Admission: RE | Admit: 2022-05-17 | Discharge: 2022-05-17 | Disposition: A | Discharge status: Home / Self Care | Payer: Medicare Other | Source: Ambulatory Visit | Attending: Physician Assistant | Admitting: Physician Assistant

## 2022-05-17 ENCOUNTER — Other Ambulatory Visit: Payer: Self-pay | Admitting: Family Medicine

## 2022-05-17 DIAGNOSIS — M7989 Other specified soft tissue disorders: Secondary | ICD-10-CM | POA: Diagnosis not present

## 2022-05-17 DIAGNOSIS — M25561 Pain in right knee: Secondary | ICD-10-CM | POA: Diagnosis not present

## 2022-05-17 DIAGNOSIS — E118 Type 2 diabetes mellitus with unspecified complications: Secondary | ICD-10-CM

## 2022-05-17 DIAGNOSIS — M1712 Unilateral primary osteoarthritis, left knee: Secondary | ICD-10-CM | POA: Diagnosis not present

## 2022-05-17 DIAGNOSIS — Z96651 Presence of right artificial knee joint: Secondary | ICD-10-CM | POA: Diagnosis not present

## 2022-05-17 MED ORDER — TECHNETIUM TC 99M MEDRONATE IV KIT
20.0000 | PACK | Freq: Once | INTRAVENOUS | Status: AC | PRN
  Administered 2022-05-17: 21.77 via INTRAVENOUS

## 2022-05-18 NOTE — Telephone Encounter (Signed)
Requested medication (s) are due for refill today: yes  Requested medication (s) are on the active medication list: no  Last refill:  04/16/22   prescription expired 04/16/22  Future visit scheduled: no  Notes to clinic: order was written and ended the same day   Requested Prescriptions  Pending Prescriptions Disp Refills   Continuous Blood Gluc Sensor (FREESTYLE LIBRE 2 SENSOR) [place] [Pharmacy Med Name: FREESTYLE LIBRE 2 SENSOR]  1    Sig: USE TO [place] AS DIRECTED     Endocrinology: Diabetes - Testing Supplies Passed - 05/17/2022  8:11 AM      Passed - Valid encounter within last 12 months    Recent Outpatient Visits           4 months ago Tinea corporis   [facility] Birdie Sons, MD   5 months ago Respiratory tract infection   [employer], Dani Gobble, PA-C   6 months ago Chronic left shoulder pain   [facility] Jerrol Banana., MD   8 months ago Diabetes mellitus type 2, uncontrolled, with complications [facility])   [facility] Jerrol Banana., MD   10 months ago Cellulitis of lower extremity, unspecified laterality   [facility] Jerrol Banana., MD

## 2022-05-19 ENCOUNTER — Telehealth: Payer: Self-pay | Admitting: Family Medicine

## 2022-05-19 DIAGNOSIS — M48061 Spinal stenosis, lumbar region without neurogenic claudication: Secondary | ICD-10-CM

## 2022-05-19 NOTE — Telephone Encounter (Signed)
Medication Refill - Medication:  oxyCODONE (OXYCONTIN) 40 mg 12 hr tablet   Has the patient contacted their pharmacy? Yes.   Contact PCP  Preferred Pharmacy (with phone number or street name):  [facility] [place] [facility] [address]  [place], [place] [address]  Phone:  [phone]  Fax:  [phone]   Has the patient been seen for an appointment in the last year OR does the patient have an upcoming appointment? Yes.    Agent: Please be advised that RX refills may take up to 3 business days. We ask that you follow-up with your pharmacy.

## 2022-05-20 NOTE — Telephone Encounter (Signed)
Requested medication (s) are due for refill today: yes  Requested medication (s) are on the active medication list: yes  Last refill:  02/02/22 #270 0 refills  Future visit scheduled: yes in 1 week   Notes to clinic:  not delegated per protocol      Requested Prescriptions  Pending Prescriptions Disp Refills   oxyCODONE (OXYCONTIN) 40 mg 12 hr tablet 270 tablet 0    Sig: Take 1 tablet (40 mg total) by mouth every 8 (eight) hours as needed.     Not Delegated - Analgesics:  Opioid Agonists Failed - 05/19/2022  3:47 PM      Failed - This refill cannot be delegated      Failed - Urine Drug Screen completed in last 360 days      Failed - Valid encounter within last 3 months    Recent Outpatient Visits           4 months ago Tinea corporis   [facility] Birdie Sons, MD   5 months ago Respiratory tract infection   [place], Dani Gobble, PA-C   6 months ago Chronic left shoulder pain   [facility] Jerrol Banana., MD   8 months ago Diabetes mellitus type 2, uncontrolled, with complications [facility])   [facility] Jerrol Banana., MD   10 months ago Cellulitis of lower extremity, unspecified laterality   [facility] Jerrol Banana., MD       Future Appointments             In 1 week Jerrol Banana., MD [facility]

## 2022-05-21 MED ORDER — OXYCODONE HCL ER 40 MG PO T12A: 40.0000 mg | EXTENDED_RELEASE_TABLET | Freq: Three times a day (TID) | ORAL | 0 refills | Status: DC | PRN

## 2022-05-25 DIAGNOSIS — E291 Testicular hypofunction: Secondary | ICD-10-CM | POA: Diagnosis not present

## 2022-05-25 DIAGNOSIS — Z79899 Other long term (current) drug therapy: Secondary | ICD-10-CM | POA: Diagnosis not present

## 2022-05-25 DIAGNOSIS — N5201 Erectile dysfunction due to arterial insufficiency: Secondary | ICD-10-CM | POA: Diagnosis not present

## 2022-05-25 DIAGNOSIS — N401 Enlarged prostate with lower urinary tract symptoms: Secondary | ICD-10-CM | POA: Diagnosis not present

## 2022-05-27 DIAGNOSIS — Z79899 Other long term (current) drug therapy: Secondary | ICD-10-CM | POA: Diagnosis not present

## 2022-05-27 DIAGNOSIS — E291 Testicular hypofunction: Secondary | ICD-10-CM | POA: Diagnosis not present

## 2022-05-27 DIAGNOSIS — N401 Enlarged prostate with lower urinary tract symptoms: Secondary | ICD-10-CM | POA: Diagnosis not present

## 2022-05-27 NOTE — Telephone Encounter (Signed)
Pt wife called and stated pharmacy advised her a PA from provider is needed for medication.  Please advise.     Pt wife is requesting a call back.

## 2022-05-27 NOTE — Telephone Encounter (Signed)
Rx was sent 05/21/2022. PA request has not been received.

## 2022-05-31 ENCOUNTER — Ambulatory Visit (INDEPENDENT_AMBULATORY_CARE_PROVIDER_SITE_OTHER): Payer: Medicare Other | Admitting: Family Medicine

## 2022-05-31 ENCOUNTER — Encounter: Payer: Self-pay | Admitting: Family Medicine

## 2022-05-31 VITALS — BP 117/54 | HR 73 | Resp 18 | Wt 270.0 lb

## 2022-05-31 DIAGNOSIS — M48062 Spinal stenosis, lumbar region with neurogenic claudication: Secondary | ICD-10-CM | POA: Diagnosis not present

## 2022-05-31 DIAGNOSIS — I5032 Chronic diastolic (congestive) heart failure: Secondary | ICD-10-CM | POA: Diagnosis not present

## 2022-05-31 DIAGNOSIS — N529 Male erectile dysfunction, unspecified: Secondary | ICD-10-CM

## 2022-05-31 DIAGNOSIS — G4733 Obstructive sleep apnea (adult) (pediatric): Secondary | ICD-10-CM

## 2022-05-31 DIAGNOSIS — Z9989 Dependence on other enabling machines and devices: Secondary | ICD-10-CM

## 2022-05-31 DIAGNOSIS — I10 Essential (primary) hypertension: Secondary | ICD-10-CM

## 2022-05-31 DIAGNOSIS — R600 Localized edema: Secondary | ICD-10-CM

## 2022-05-31 DIAGNOSIS — E1142 Type 2 diabetes mellitus with diabetic polyneuropathy: Secondary | ICD-10-CM

## 2022-05-31 DIAGNOSIS — I480 Paroxysmal atrial fibrillation: Secondary | ICD-10-CM | POA: Diagnosis not present

## 2022-05-31 DIAGNOSIS — M170 Bilateral primary osteoarthritis of knee: Secondary | ICD-10-CM

## 2022-05-31 DIAGNOSIS — M5441 Lumbago with sciatica, right side: Secondary | ICD-10-CM

## 2022-05-31 DIAGNOSIS — M47816 Spondylosis without myelopathy or radiculopathy, lumbar region: Secondary | ICD-10-CM | POA: Diagnosis not present

## 2022-05-31 DIAGNOSIS — M5442 Lumbago with sciatica, left side: Secondary | ICD-10-CM | POA: Diagnosis not present

## 2022-05-31 DIAGNOSIS — G629 Polyneuropathy, unspecified: Secondary | ICD-10-CM | POA: Diagnosis not present

## 2022-05-31 DIAGNOSIS — Z794 Long term (current) use of insulin: Secondary | ICD-10-CM

## 2022-05-31 DIAGNOSIS — G8929 Other chronic pain: Secondary | ICD-10-CM

## 2022-05-31 MED ORDER — SILDENAFIL CITRATE 20 MG PO TABS: 20.0000 mg | ORAL_TABLET | Freq: Two times a day (BID) | ORAL | 2 refills | Status: DC

## 2022-05-31 NOTE — Telephone Encounter (Signed)
Form was completed and faxed back

## 2022-05-31 NOTE — Telephone Encounter (Signed)
Fax was received

## 2022-05-31 NOTE — Progress Notes (Signed)
Established patient visit  I,[name],acting as a scribe for [name], MD.,have documented all relevant documentation on the behalf of [name], MD,as directed by  [name], MD while in the presence of [name], MD.   Patient: Jerry Parsons   DOB: [birth year]   71 y.o. Male  MRN: [id number] Visit Date: 05/31/2022  Today's healthcare provider: [name], MD   Chief Complaint  Patient presents with   Follow-up   Subjective    HPI  Patient is here for refills on his medications.  Overall everything is stable but is worsening pain in his back and legs and knees continues.  He is describing neurogenic claudication and chronic arthritic knee pain. He is on OxyContin for chronic pain but has not seen a neurosurgeon or recent years to discuss options for his neurogenic claudication. He sees Dr. Yves Dill for his testosterone and was recently started on Viagra 20 mg twice daily by Dr. Eliberto Ivory for ED.  Wonders if he can get it cheaper. He is trying to control diet and exercise and his diabetes.  His last A1c was 7.2 last month. He is having no chest pain or new shortness of breath. He wears a CPAP nightly Medications: Outpatient Medications Prior to Visit  Medication Sig   amiodarone (PACERONE) 200 MG tablet TAKE 1 TABLET BY MOUTH EVERY DAY   Apoaequorin (PREVAGEN) 10 MG CAPS Take 10 mg by mouth daily.   aspirin EC 81 MG tablet Take 81 mg by mouth daily. Swallow whole.   Continuous Blood Gluc Sensor (FREESTYLE LIBRE 2 SENSOR) MISC USE TO CHECK BLOOD SUGAR AS DIRECTED   cyclobenzaprine (FLEXERIL) 10 MG tablet TAKE 1 TABLET BY MOUTH 3 TIMES A DAY   dutasteride (AVODART) 0.5 MG capsule TAKE 1 CAPSULE DAILY   ELIQUIS 5 MG TABS tablet TAKE 1 TABLET TWICE A DAY   esomeprazole (NEXIUM) 40 MG capsule TAKE 1 CAPSULE TWICE DAILY   FARXIGA 10 MG TABS tablet TAKE 1 TABLET BY MOUTH EVERY DAY   ferrous sulfate 325 (65 FE) MG tablet Take 1 tablet by  mouth daily.   fexofenadine (ALLEGRA) 180 MG tablet Take 180 mg by mouth daily.   furosemide (LASIX) 40 MG tablet TAKE 1 TABLET BY MOUTH EVERY DAY   gabapentin (NEURONTIN) 100 MG capsule TAKE 1 CAPSULE BY MOUTH TWICE A DAY   gabapentin (NEURONTIN) 300 MG capsule Take 300 mg by mouth at bedtime.   Insulin Glargine (BASAGLAR KWIKPEN) 100 UNIT/ML Inject 40 Units into the skin 2 (two) times daily.   JANUVIA 100 MG tablet TAKE 1 TABLET DAILY   magnesium oxide (MAG-OX) 400 MG tablet TAKE 1 TABLET (400 MG TOTAL) BY MOUTH 2 (TWO) TIMES DAILY.   metoprolol tartrate (LOPRESSOR) 100 MG tablet TAKE 1 TABLET BY MOUTH TWICE A DAY   NOVOLOG FLEXPEN 100 UNIT/ML FlexPen INJECT 14 UNITS            SUBCUTANEOUSLY IN THE      MORNING AND AT BEDTIME   Omega-3 Fatty Acids (FISH OIL) 1200 MG CAPS Take 1,200 mg by mouth 2 (two) times daily.    oxyCODONE (OXYCONTIN) 40 mg 12 hr tablet Take 1 tablet (40 mg total) by mouth every 8 (eight) hours as needed.   pregabalin (LYRICA) 200 MG capsule TAKE 1 CAPSULE 3 TIMES A   DAY   rOPINIRole (REQUIP) 1 MG tablet TAKE 1-2 TABLETS BY MOUTH AT BEDTIME AS NEEDED   rOPINIRole (  REQUIP) 2 MG tablet TAKE 1 TABLET BY MOUTH TWICE A DAY AS NEEDED   sucralfate (CARAFATE) 1 g tablet TAKE 1 TABLET BY MOUTH 4 (FOUR) TIMES DAILY - WITH MEALS AND AT BEDTIME.   tamsulosin (FLOMAX) 0.4 MG CAPS capsule Take 0.4 mg by mouth daily.   testosterone cypionate (DEPOTESTOSTERONE CYPIONATE) 200 MG/ML injection Inject 200 mg into the muscle every 21 ( twenty-one) days.    Turmeric (QC TUMERIC COMPLEX PO) Take by mouth daily.   VICTOZA 18 MG/3ML SOPN INJECT 1.'8MG'$  SUBCUTANEOUSLYDAILY   VITAMIN D PO Take by mouth daily at 2 am.   [DISCONTINUED] sildenafil (REVATIO) 20 MG tablet Take 20 mg by mouth in the morning and at bedtime.   clobetasol ointment (TEMOVATE) 0.05 % Apply topically 2 (two) times daily. (Patient not taking: Reported on 05/14/2022)   [DISCONTINUED] doxycycline (VIBRA-TABS) 100 MG tablet Take 1  tablet (100 mg total) by mouth 2 (two) times daily. (Patient not taking: Reported on 05/14/2022)   [DISCONTINUED] fexofenadine (ALLEGRA) 180 MG tablet Take 180 mg by mouth daily.   [DISCONTINUED] Insulin Pen Needle (PEN NEEDLES) 31G X 5 MM MISC 1 each by Does not apply route daily. PATIENT NEEDS NOVA TWIST NEEDLES 5 MM  DX E11.9 (Patient not taking: Reported on 05/14/2022)   [DISCONTINUED] ketoconazole (NIZORAL) 2 % cream Apply 1 application topically 2 (two) times daily. (Patient not taking: Reported on 05/14/2022)   [DISCONTINUED] mupirocin cream (BACTROBAN) 2 % APPLY TO AFFECTED AREA TWICE A DAY (Patient not taking: Reported on 01/15/2022)   [DISCONTINUED] mupirocin ointment (BACTROBAN) 2 % Apply 1 application topically 2 (two) times daily as needed. (Patient not taking: Reported on 01/15/2022)   [DISCONTINUED] Naloxone HCl 0.4 MG/0.4ML SOAJ Inject as needed for suspected overdose. (Patient not taking: Reported on 01/15/2022)   [DISCONTINUED] NOVOFINE PLUS PEN NEEDLE 32G X 4 MM MISC USE AS DIRECTED DAILY (Patient not taking: Reported on 05/14/2022)   [DISCONTINUED] ONETOUCH ULTRA test strip USE WITH METER TWICE DAILY (Patient not taking: Reported on 05/14/2022)   No facility-administered medications prior to visit.    Review of Systems  Last hemoglobin A1c Lab Results  Component Value Date   HGBA1C 7.2 (H) 05/14/2022       Objective    BP (!) 117/54 (BP Location: Left Arm, Patient Position: Sitting, Cuff Size: Large)   Pulse 73   Resp 18   Wt 270 lb (122.5 kg)   SpO2 93%   BMI 37.66 kg/m  BP Readings from Last 3 Encounters:  05/31/22 (!) 117/54  05/14/22 130/62  01/15/22 (!) 135/54   Wt Readings from Last 3 Encounters:  05/31/22 270 lb (122.5 kg)  05/14/22 263 lb 8 oz (119.5 kg)  01/15/22 264 lb (119.7 kg)      Physical Exam Vitals reviewed.  Constitutional:      Appearance: He is well-developed. He is obese.  HENT:     Head: Normocephalic and atraumatic.     Right Ear:  External ear normal.     Left Ear: External ear normal.     Nose: Nose normal.  Eyes:     General: No scleral icterus.    Conjunctiva/sclera: Conjunctivae normal.  Neck:     Thyroid: No thyromegaly.  Cardiovascular:     Rate and Rhythm: Normal rate and regular rhythm.     Heart sounds: Normal heart sounds.  Pulmonary:     Effort: Pulmonary effort is normal.     Breath sounds: Normal breath sounds.  Abdominal:  Palpations: Abdomen is soft.  Musculoskeletal:     Right lower leg: Edema present.     Left lower leg: Edema present.     Comments: 1+ LE edema.    Skin:    General: Skin is warm and dry.  Neurological:     Mental Status: He is alert and oriented to person, place, and time. Mental status is at baseline.     Comments: Decreased sensation to monofilament in bilateral distal feet. No nystagmus.  Gait is normal.  Psychiatric:        Mood and Affect: Mood normal.        Behavior: Behavior normal.        Thought Content: Thought content normal.        Judgment: Judgment normal.       No results found for any visits on 05/31/22.  Assessment & Plan     1. HYPERTENSION, BENIGN Good blood pressure control. - CBC w/Diff/Platelet - Comprehensive Metabolic Panel (CMET) - Lipid panel - TSH - Vitamin B12 - Folate  2. Paroxysmal atrial fibrillation (HCC) On Eliquis - CBC w/Diff/Platelet - Comprehensive Metabolic Panel (CMET) - Lipid panel - TSH - Vitamin B12 - Folate  3. Chronic diastolic CHF (congestive heart failure) (HCC) Clinically stable.  Also on Farxiga for this. - CBC w/Diff/Platelet - Comprehensive Metabolic Panel (CMET) - Lipid panel - TSH - Vitamin B12 - Folate  4. OSA on CPAP He is compliant and it helps him, specifically he has had no aspiration pneumonitis since he is started CPAP - CBC w/Diff/Platelet - Comprehensive Metabolic Panel (CMET) - Lipid panel - TSH - Vitamin B12 - Folate  5. Type 2 diabetes mellitus with diabetic  polyneuropathy, with long-term current use of insulin ([place]) Better control recently with A1c of 7.2 a month ago.  Follow-up 3 to 4 months. - CBC w/Diff/Platelet - Comprehensive Metabolic Panel (CMET) - Lipid panel - TSH - Vitamin B12 - Folate  6. Neuropathy Chronic problem.  Check B12 and folate to make sure there is nothing outside the diabetes and the back that could be contributing to his neuropathy - CBC w/Diff/Platelet - Comprehensive Metabolic Panel (CMET) - Lipid panel - TSH - Vitamin B12 - Folate  7. Primary osteoarthritis of both knees Followed by orthopedic - CBC w/Diff/Platelet - Comprehensive Metabolic Panel (CMET) - Lipid panel - TSH - Vitamin B12 - Folate  8. Lumbar spondylosis Refer back to neurosurgery Dr. Cari Caraway has a new neurosurgeon as his retired. - CBC w/Diff/Platelet - Comprehensive Metabolic Panel (CMET) - Lipid panel - TSH - Vitamin B12 - Folate - Ambulatory referral to Neurosurgery  9. Spinal stenosis of lumbar region with neurogenic claudication Refer to neurosurgery - CBC w/Diff/Platelet - Comprehensive Metabolic Panel (CMET) - Lipid panel - TSH - Vitamin B12 - Folate - Ambulatory referral to Neurosurgery  10. Leg edema Wears support hose and elevate legs. - CBC w/Diff/Platelet - Comprehensive Metabolic Panel (CMET) - Lipid panel - TSH - Vitamin B12 - Folate  11. Chronic bilateral low back pain with bilateral sciatica  - CBC w/Diff/Platelet - Comprehensive Metabolic Panel (CMET) - Lipid panel - TSH - Vitamin B12 - Folate - Ambulatory referral to Neurosurgery  12. Erectile dysfunction, unspecified erectile dysfunction type Viagra written with discount drug card. - sildenafil (REVATIO) 20 MG tablet; Take 1 tablet (20 mg total) by mouth in the morning and at bedtime.  Dispense: 60 tablet; Refill: 2   Return in about 6 months (around 11/30/2022).  I, [name], MD, have reviewed all documentation for this  visit. The documentation on 06/03/22 for the exam, diagnosis, procedures, and orders are all accurate and complete.    Michille Mcelrath Cranford Mon, MD  [facility] [phone] (phone) [phone] (fax)  [place]

## 2022-06-08 DIAGNOSIS — I5032 Chronic diastolic (congestive) heart failure: Secondary | ICD-10-CM | POA: Diagnosis not present

## 2022-06-08 DIAGNOSIS — M5441 Lumbago with sciatica, right side: Secondary | ICD-10-CM | POA: Diagnosis not present

## 2022-06-08 DIAGNOSIS — M47816 Spondylosis without myelopathy or radiculopathy, lumbar region: Secondary | ICD-10-CM | POA: Diagnosis not present

## 2022-06-08 DIAGNOSIS — E1142 Type 2 diabetes mellitus with diabetic polyneuropathy: Secondary | ICD-10-CM | POA: Diagnosis not present

## 2022-06-08 DIAGNOSIS — Z794 Long term (current) use of insulin: Secondary | ICD-10-CM | POA: Diagnosis not present

## 2022-06-08 DIAGNOSIS — Z9989 Dependence on other enabling machines and devices: Secondary | ICD-10-CM | POA: Diagnosis not present

## 2022-06-08 DIAGNOSIS — R600 Localized edema: Secondary | ICD-10-CM | POA: Diagnosis not present

## 2022-06-08 DIAGNOSIS — G4733 Obstructive sleep apnea (adult) (pediatric): Secondary | ICD-10-CM | POA: Diagnosis not present

## 2022-06-08 DIAGNOSIS — G629 Polyneuropathy, unspecified: Secondary | ICD-10-CM | POA: Diagnosis not present

## 2022-06-08 DIAGNOSIS — I10 Essential (primary) hypertension: Secondary | ICD-10-CM | POA: Diagnosis not present

## 2022-06-08 DIAGNOSIS — I480 Paroxysmal atrial fibrillation: Secondary | ICD-10-CM | POA: Diagnosis not present

## 2022-06-08 DIAGNOSIS — M5442 Lumbago with sciatica, left side: Secondary | ICD-10-CM | POA: Diagnosis not present

## 2022-06-10 DIAGNOSIS — Z96651 Presence of right artificial knee joint: Secondary | ICD-10-CM | POA: Diagnosis not present

## 2022-06-10 DIAGNOSIS — M1712 Unilateral primary osteoarthritis, left knee: Secondary | ICD-10-CM | POA: Diagnosis not present

## 2022-06-14 DIAGNOSIS — M1712 Unilateral primary osteoarthritis, left knee: Secondary | ICD-10-CM | POA: Diagnosis not present

## 2022-06-18 ENCOUNTER — Other Ambulatory Visit: Payer: Self-pay | Admitting: Family Medicine

## 2022-06-18 ENCOUNTER — Other Ambulatory Visit: Payer: Self-pay | Admitting: Cardiovascular Disease

## 2022-06-18 DIAGNOSIS — I480 Paroxysmal atrial fibrillation: Secondary | ICD-10-CM

## 2022-06-18 DIAGNOSIS — K219 Gastro-esophageal reflux disease without esophagitis: Secondary | ICD-10-CM

## 2022-06-18 LAB — CBC WITH DIFFERENTIAL/PLATELET
Basophils Absolute: 0 10*3/uL (ref 0.0–0.2)
Basos: 0 %
EOS (ABSOLUTE): 0.2 10*3/uL (ref 0.0–0.4)
Eos: 3 %
Hematocrit: 48.3 % (ref 37.5–51.0)
Hemoglobin: 15.3 g/dL (ref 13.0–17.7)
Immature Grans (Abs): 0 10*3/uL (ref 0.0–0.1)
Immature Granulocytes: 0 %
Lymphocytes Absolute: 1.6 10*3/uL (ref 0.7–3.1)
Lymphs: 29 %
MCH: 28.4 pg (ref 26.6–33.0)
MCHC: 31.7 g/dL (ref 31.5–35.7)
MCV: 90 fL (ref 79–97)
Monocytes Absolute: 0.4 10*3/uL (ref 0.1–0.9)
Monocytes: 8 %
Neutrophils Absolute: 3.4 10*3/uL (ref 1.4–7.0)
Neutrophils: 60 %
Platelets: 103 10*3/uL — ABNORMAL LOW (ref 150–450)
RBC: 5.38 x10E6/uL (ref 4.14–5.80)
RDW: 15.6 % — ABNORMAL HIGH (ref 11.6–15.4)
WBC: 5.6 10*3/uL (ref 3.4–10.8)

## 2022-06-18 LAB — LIPID PANEL
Chol/HDL Ratio: 6.2 ratio — ABNORMAL HIGH (ref 0.0–5.0)
Cholesterol, Total: 143 mg/dL (ref 100–199)
HDL: 23 mg/dL — ABNORMAL LOW (ref >39)
LDL Chol Calc (NIH): 73 mg/dL (ref 0–99)
Triglycerides: 292 mg/dL — ABNORMAL HIGH (ref 0–149)
VLDL Cholesterol Cal: 47 mg/dL — ABNORMAL HIGH (ref 5–40)

## 2022-06-18 LAB — COMPREHENSIVE METABOLIC PANEL
ALT: 19 IU/L (ref 0–44)
AST: 19 IU/L (ref 0–40)
Albumin/Globulin Ratio: 2 (ref 1.2–2.2)
Albumin: 4.3 g/dL (ref 3.7–4.7)
Alkaline Phosphatase: 90 IU/L (ref 44–121)
BUN/Creatinine Ratio: 13 (ref 10–24)
BUN: 19 mg/dL (ref 8–27)
Bilirubin Total: 0.6 mg/dL (ref 0.0–1.2)
CO2: 30 mmol/L — ABNORMAL HIGH (ref 20–29)
Calcium: 8.5 mg/dL — ABNORMAL LOW (ref 8.6–10.2)
Chloride: 103 mmol/L (ref 96–106)
Creatinine, Ser: 1.43 mg/dL — ABNORMAL HIGH (ref 0.76–1.27)
Globulin, Total: 2.2 g/dL (ref 1.5–4.5)
Glucose: 144 mg/dL — ABNORMAL HIGH (ref 70–99)
Potassium: 4.6 mmol/L (ref 3.5–5.2)
Sodium: 146 mmol/L — ABNORMAL HIGH (ref 134–144)
Total Protein: 6.5 g/dL (ref 6.0–8.5)
eGFR: 52 mL/min/{1.73_m2} — ABNORMAL LOW (ref >59)

## 2022-06-18 LAB — TSH: TSH: 6.66 u[IU]/mL — ABNORMAL HIGH (ref 0.450–4.500)

## 2022-06-18 LAB — VITAMIN B12: Vitamin B-12: 689 pg/mL (ref 232–1245)

## 2022-06-18 LAB — FOLATE: Folate: >20 ng/mL (ref >3.0)

## 2022-06-18 NOTE — Telephone Encounter (Signed)
Refill Request.  

## 2022-06-21 DIAGNOSIS — M1712 Unilateral primary osteoarthritis, left knee: Secondary | ICD-10-CM | POA: Diagnosis not present

## 2022-07-09 ENCOUNTER — Other Ambulatory Visit: Payer: Self-pay | Admitting: Family Medicine

## 2022-07-09 DIAGNOSIS — G2581 Restless legs syndrome: Secondary | ICD-10-CM

## 2022-07-09 NOTE — Telephone Encounter (Signed)
Requested Prescriptions  Pending Prescriptions Disp Refills  . rOPINIRole (REQUIP) 2 MG tablet [Pharmacy Med Name: ROPINIROLE HCL 2 MG TABLET] 180 tablet 0    Sig: TAKE 1 TABLET BY MOUTH TWICE A DAY AS NEEDED     Neurology:  Parkinsonian Agents Passed - 07/09/2022  2:29 AM      Passed - Last BP in normal range    BP Readings from Last 1 Encounters:  05/31/22 (!) 117/54         Passed - Last Heart Rate in normal range    Pulse Readings from Last 1 Encounters:  05/31/22 73         Passed - Valid encounter within last 12 months    Recent Outpatient Visits          1 month ago HYPERTENSION, [place] Jerrol Banana., MD   5 months ago Tinea corporis   [place], Kirstie Peri, MD   6 months ago Respiratory tract infection   [place], Dani Gobble, PA-C   8 months ago Chronic left shoulder pain   [facility] Jerrol Banana., MD   10 months ago Diabetes mellitus type 2, uncontrolled, with complications [facility])   [facility] Jerrol Banana., MD

## 2022-07-10 ENCOUNTER — Other Ambulatory Visit: Payer: Self-pay | Admitting: Family Medicine

## 2022-07-10 ENCOUNTER — Other Ambulatory Visit: Payer: Self-pay | Admitting: Medical

## 2022-07-20 DIAGNOSIS — D2262 Melanocytic nevi of left upper limb, including shoulder: Secondary | ICD-10-CM | POA: Diagnosis not present

## 2022-07-20 DIAGNOSIS — L298 Other pruritus: Secondary | ICD-10-CM | POA: Diagnosis not present

## 2022-07-20 DIAGNOSIS — D2272 Melanocytic nevi of left lower limb, including hip: Secondary | ICD-10-CM | POA: Diagnosis not present

## 2022-07-20 DIAGNOSIS — D2261 Melanocytic nevi of right upper limb, including shoulder: Secondary | ICD-10-CM | POA: Diagnosis not present

## 2022-07-20 DIAGNOSIS — L918 Other hypertrophic disorders of the skin: Secondary | ICD-10-CM | POA: Diagnosis not present

## 2022-07-20 DIAGNOSIS — D2271 Melanocytic nevi of right lower limb, including hip: Secondary | ICD-10-CM | POA: Diagnosis not present

## 2022-07-20 DIAGNOSIS — R208 Other disturbances of skin sensation: Secondary | ICD-10-CM | POA: Diagnosis not present

## 2022-07-20 DIAGNOSIS — L570 Actinic keratosis: Secondary | ICD-10-CM | POA: Diagnosis not present

## 2022-07-20 DIAGNOSIS — D225 Melanocytic nevi of trunk: Secondary | ICD-10-CM | POA: Diagnosis not present

## 2022-07-20 DIAGNOSIS — I872 Venous insufficiency (chronic) (peripheral): Secondary | ICD-10-CM | POA: Diagnosis not present

## 2022-07-20 DIAGNOSIS — X32XXXA Exposure to sunlight, initial encounter: Secondary | ICD-10-CM | POA: Diagnosis not present

## 2022-07-21 ENCOUNTER — Encounter: Payer: Self-pay | Admitting: Podiatry

## 2022-07-21 ENCOUNTER — Other Ambulatory Visit: Payer: Self-pay | Admitting: Family Medicine

## 2022-07-21 ENCOUNTER — Ambulatory Visit (INDEPENDENT_AMBULATORY_CARE_PROVIDER_SITE_OTHER): Payer: Medicare Other | Admitting: Podiatry

## 2022-07-21 DIAGNOSIS — E1142 Type 2 diabetes mellitus with diabetic polyneuropathy: Secondary | ICD-10-CM

## 2022-07-21 DIAGNOSIS — M79675 Pain in left toe(s): Secondary | ICD-10-CM

## 2022-07-21 DIAGNOSIS — B351 Tinea unguium: Secondary | ICD-10-CM | POA: Diagnosis not present

## 2022-07-21 DIAGNOSIS — M79674 Pain in right toe(s): Secondary | ICD-10-CM

## 2022-07-21 DIAGNOSIS — I739 Peripheral vascular disease, unspecified: Secondary | ICD-10-CM

## 2022-07-21 NOTE — Progress Notes (Signed)
Subjective: Jerry Parsons is a 71 y.o. male patient with concern of thickened elongated and painful nails that are difficult to trim. Requesting to have them trimmed today. Relates burning and tingling in their feet. Patient is diabetic and last A1c was  Lab Results  Component Value Date   HGBA1C 7.2 (H) 05/14/2022   .   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   Primary hyperparathyroidism ([place])    Osteoarthritis of both knees 11/11/2018   OSA on CPAP 10/14/2018   Lumbar spondylosis 05/05/2018   Spinal stenosis of lumbar region 05/05/2018   Constipation, chronic 03/08/2018   Pain in limb 03/03/2018   Gastroesophageal reflux disease without esophagitis 01/04/2018   OA (osteoarthritis) of knee 06/20/2017   Sepsis due to pneumonia ([place]) 05/07/2017   Neuropathy 10/13/2016   Amblyopia 12/30/2015   Cornea scar 12/30/2015   NS (nuclear sclerosis) 12/30/2015   Pseudoaphakia 12/30/2015   Dehydration 09/08/2015   Aspiration pneumonia ([place]) 07/04/2015   Paroxysmal atrial fibrillation ([place]) 07/04/2015   Arthritis of knee, degenerative 05/28/2015   Chronic back pain 09/17/2014   Leg edema 05/07/2014   Chronic diastolic CHF (congestive heart failure) ([place]) 05/07/2014   Campylobacter diarrhea 04/25/2013   Atrial flutter ([place]) 01/23/2013   Obesity 05/19/2012   Hyperlipidemia 81/85/6314   Diastolic dysfunction 97/01/6377   SOB (shortness of breath) 04/12/2011   Diabetes mellitus type 2, uncontrolled, with complications 58/85/0277   HYPERTENSION, BENIGN 03/15/2011   DVT 03/15/2011   TACHYCARDIA 03/15/2011   Current Outpatient Medications on File Prior to Visit  Medication Sig Dispense Refill   amiodarone  (PACERONE) 200 MG tablet TAKE 1 TABLET BY MOUTH EVERY DAY 90 tablet 2   Apoaequorin (PREVAGEN) 10 MG CAPS Take 10 mg by mouth daily.     aspirin EC 81 MG tablet Take 81 mg by mouth daily. Swallow whole.     clobetasol ointment (TEMOVATE) 0.05 % Apply topically 2 (two) times daily. (Patient not taking: Reported on 05/14/2022)     Continuous Blood Gluc Sensor (FREESTYLE LIBRE 2 SENSOR) MISC USE TO CHECK BLOOD SUGAR AS DIRECTED 1 each 11   cyclobenzaprine (FLEXERIL) 10 MG tablet TAKE 1 TABLET BY MOUTH 3 TIMES A DAY 90 tablet 12   dutasteride (AVODART) 0.5 MG capsule TAKE 1 CAPSULE DAILY 90 capsule 0   ELIQUIS 5 MG TABS tablet TAKE 1 TABLET TWICE A DAY 180 tablet 1   esomeprazole (NEXIUM) 40 MG capsule TAKE 1 CAPSULE TWICE DAILY 180 capsule 0   FARXIGA 10 MG TABS tablet TAKE 1 TABLET BY MOUTH EVERY DAY 90 tablet 3   ferrous sulfate 325 (65 FE) MG tablet Take 1 tablet by mouth daily.     fexofenadine (ALLEGRA) 180 MG tablet Take 180 mg by mouth daily.     furosemide (LASIX) 40 MG tablet TAKE 1 TABLET BY MOUTH EVERY DAY 90 tablet 0   gabapentin (NEURONTIN) 100 MG capsule TAKE 1 CAPSULE BY MOUTH TWICE A DAY 180 capsule 1   gabapentin (NEURONTIN) 300 MG capsule Take 300 mg by mouth at bedtime.     Insulin Glargine (BASAGLAR KWIKPEN) 100 UNIT/ML Inject 40 Units into the skin 2 (two) times daily. 45 mL 3   JANUVIA  100 MG tablet TAKE 1 TABLET DAILY 90 tablet 0   magnesium oxide (MAG-OX) 400 MG tablet TAKE 1 TABLET (400 MG TOTAL) BY MOUTH 2 (TWO) TIMES DAILY. 180 tablet 3   metoprolol tartrate (LOPRESSOR) 100 MG tablet TAKE 1 TABLET BY MOUTH TWICE A DAY 180 tablet 4   NOVOLOG FLEXPEN 100 UNIT/ML FlexPen INJECT 14 UNITS            SUBCUTANEOUSLY IN THE      MORNING AND AT BEDTIME 15 mL 6   Omega-3 Fatty Acids (FISH OIL) 1200 MG CAPS Take 1,200 mg by mouth 2 (two) times daily.      oxyCODONE (OXYCONTIN) 40 mg 12 hr tablet Take 1 tablet (40 mg total) by mouth every 8 (eight) hours as needed. 270 tablet 0    pregabalin (LYRICA) 200 MG capsule TAKE 1 CAPSULE 3 TIMES A   DAY 270 capsule 1   rOPINIRole (REQUIP) 2 MG tablet TAKE 1 TABLET BY MOUTH TWICE A DAY AS NEEDED 180 tablet 0   sildenafil (REVATIO) 20 MG tablet Take 1 tablet (20 mg total) by mouth in the morning and at bedtime. 60 tablet 2   sucralfate (CARAFATE) 1 g tablet TAKE 1 TABLET BY MOUTH 4 (FOUR) TIMES DAILY - WITH MEALS AND AT BEDTIME. 360 tablet 1   tamsulosin (FLOMAX) 0.4 MG CAPS capsule Take 0.4 mg by mouth daily.     testosterone cypionate (DEPOTESTOSTERONE CYPIONATE) 200 MG/ML injection Inject 200 mg into the muscle every 21 ( twenty-one) days.      Turmeric (QC TUMERIC COMPLEX PO) Take by mouth daily.     VICTOZA 18 MG/3ML SOPN INJECT 1.'8MG'$  SUBCUTANEOUSLYDAILY 27 mL 3   VITAMIN D PO Take by mouth daily at 2 am.     No current facility-administered medications on file prior to visit.   Allergies  Allergen Reactions   Carisoprodol Itching   Tamsulosin     Pt stated, "upsets my stomach"    Objective: General: Patient is awake, alert, and oriented x 3 and in no acute distress.  Integument: Skin is warm, dry and supple bilateral. Nails are tender, long, thickened and dystrophic with subungual debris, consistent with onychomycosis, 1-5 bilateral. No open lesions or preulcerative lesions present bilateral. Remaining integument unremarkable. .  Vasculature:  Dorsalis Pedis pulse 0/4 bilateral. Posterior Tibial pulse  0/4 bilateral. Capillary fill time <3 sec 1-5 bilateral. No hair growth to the level of the digits.Temperature gradient within normal limits. No varicosities present bilateral. 1+ pitting edema present bilateral. +Stucco dermatitis due to PVD without infection.   Neurology: The patient has absent sensation measured with a 5.07/10g Semmes Weinstein Monofilament at all pedal sites bilateral, unchanged from prior  Musculoskeletal: Asymptomatic hammertoes pedal deformities noted bilateral R>L.  Muscular strength 5/5 in all  lower extremity muscular groups bilateral without pain on range of motion. No tenderness with calf compression bilateral.  Assessment and Plan: Problem List Items Addressed This Visit   None Visit Diagnoses     Pain due to onychomycosis of toenails of both feet    -  Primary   Diabetic polyneuropathy associated with type 2 diabetes mellitus (HCC)       PAD (peripheral artery disease) ([place])          -Examined patient. -Re-Discussed and educated patient on diabetic foot care, especially with  regards to the vascular, neurological and musculoskeletal systems.  -Mechanically debrided all painful nails 1-5 bilateral using sterile nail nipper and filed with dremel without incident  -  Advised good supportive shoes daily for foot type to accommodate hammertoes like before and encouraged elevation of legs to assist with edema control -Patient to return  in 2.5 to 3 months for at risk foot care or sooner if problems arise.  Lorenda Peck, DPM

## 2022-07-23 ENCOUNTER — Other Ambulatory Visit: Payer: Self-pay | Admitting: Family Medicine

## 2022-07-23 DIAGNOSIS — K219 Gastro-esophageal reflux disease without esophagitis: Secondary | ICD-10-CM

## 2022-07-23 NOTE — Telephone Encounter (Signed)
Requested medications are due for refill today.  yes  Requested medications are on the active medications list.  yes  Last refill. 08/04/2021 #180 3 refills  Future visit scheduled.   yes  Notes to clinic.  Labs are expired.    Requested Prescriptions  Pending Prescriptions Disp Refills   magnesium oxide (MAG-OX) 400 MG tablet [Pharmacy Med Name: MAGNESIUM OXIDE 400 MG TABLET] 180 tablet 3    Sig: TAKE 1 TABLET BY MOUTH TWICE A DAY     Endocrinology:  Minerals - Magnesium Supplementation Failed - 07/23/2022 11:26 AM      Failed - Mg Level in normal range and within 360 days    Magnesium  Date Value Ref Range Status  04/24/2018 1.8 1.7 - 2.4 mg/dL Final    Comment:    Performed at [facility], [address] [place], [place] [address]         Failed - Cr in normal range and within 360 days    Creat  Date Value Ref Range Status  12/13/2017 1.39 (H) 0.70 - 1.25 mg/dL Final    Comment:    For patients >20 years of age, the reference limit for Creatinine is approximately 13% higher for people identified as African-American. .    Creatinine, Ser  Date Value Ref Range Status  06/08/2022 1.43 (H) 0.76 - 1.27 mg/dL Final   Creatinine, Urine  Date Value Ref Range Status  06/24/2020 73 mg/dL Final         Passed - Valid encounter within last 12 months    Recent Outpatient Visits           1 month ago HYPERTENSION, [place] Jerrol Banana., MD   6 months ago Tinea corporis   [place], Kirstie Peri, MD   7 months ago Respiratory tract infection   [place], Dani Gobble, PA-C   8 months ago Chronic left shoulder pain   [facility] Jerrol Banana., MD   10 months ago Diabetes mellitus type 2, uncontrolled, with complications [facility])   [facility] Jerrol Banana., MD

## 2022-07-26 NOTE — Telephone Encounter (Signed)
Requested Prescriptions  Pending Prescriptions Disp Refills  . esomeprazole (NEXIUM) 40 MG capsule [Pharmacy Med Name: ESOMEPRA MAG CAP '40MG'$  DR] 180 capsule 3    Sig: TAKE 1 CAPSULE TWICE DAILY     Gastroenterology: Proton Pump Inhibitors 2 Passed - 07/23/2022 12:40 PM      Passed - ALT in normal range and within 360 days    ALT  Date Value Ref Range Status  06/08/2022 19 0 - 44 IU/L Final   SGPT (ALT)  Date Value Ref Range Status  04/12/2013 29 12 - 78 U/L Final         Passed - AST in normal range and within 360 days    AST  Date Value Ref Range Status  06/08/2022 19 0 - 40 IU/L Final   SGOT(AST)  Date Value Ref Range Status  04/12/2013 22 15 - 37 Unit/L Final         Passed - Valid encounter within last 12 months    Recent Outpatient Visits          1 month ago HYPERTENSION, [place] Jerrol Banana., MD   6 months ago Tinea corporis   [place], Kirstie Peri, MD   7 months ago Respiratory tract infection   [employer], Dani Gobble, PA-C   8 months ago Chronic left shoulder pain   [facility] Jerrol Banana., MD   10 months ago Diabetes mellitus type 2, uncontrolled, with complications [facility])   [facility] Jerrol Banana., MD

## 2022-07-27 DIAGNOSIS — N401 Enlarged prostate with lower urinary tract symptoms: Secondary | ICD-10-CM | POA: Diagnosis not present

## 2022-07-27 DIAGNOSIS — E291 Testicular hypofunction: Secondary | ICD-10-CM | POA: Diagnosis not present

## 2022-07-27 DIAGNOSIS — N5201 Erectile dysfunction due to arterial insufficiency: Secondary | ICD-10-CM | POA: Diagnosis not present

## 2022-07-27 DIAGNOSIS — Z79899 Other long term (current) drug therapy: Secondary | ICD-10-CM | POA: Diagnosis not present

## 2022-08-16 ENCOUNTER — Other Ambulatory Visit: Payer: Self-pay | Admitting: *Deleted

## 2022-08-16 ENCOUNTER — Other Ambulatory Visit: Payer: Self-pay | Admitting: Family Medicine

## 2022-08-16 ENCOUNTER — Other Ambulatory Visit: Payer: Self-pay | Admitting: Cardiovascular Disease

## 2022-08-16 DIAGNOSIS — M48061 Spinal stenosis, lumbar region without neurogenic claudication: Secondary | ICD-10-CM

## 2022-08-16 DIAGNOSIS — E1142 Type 2 diabetes mellitus with diabetic polyneuropathy: Secondary | ICD-10-CM

## 2022-08-16 DIAGNOSIS — R110 Nausea: Secondary | ICD-10-CM

## 2022-08-16 DIAGNOSIS — I5032 Chronic diastolic (congestive) heart failure: Secondary | ICD-10-CM

## 2022-08-16 NOTE — Telephone Encounter (Signed)
Medication Refill - Medication:  oxyCODONE (OXYCONTIN) 40 mg 12 hr tablet [[id number]]  Has the patient contacted their pharmacy? No. (Agent: If no, request that the patient contact the pharmacy for the refill. If patient does not wish to contact the pharmacy document the reason why and proceed with request.) (Agent: If yes, when and what did the pharmacy advise?)  Preferred Pharmacy (with phone number or street name):  [facility], [address] [place] [address]  Phone: [phone] Fax: [phone]  Hours: Not open 24 hours   Has the patient been seen for an appointment in the last year OR does the patient have an upcoming appointment? Yes.    Agent: Please be advised that RX refills may take up to 3 business days. We ask that you follow-up with your pharmacy.

## 2022-08-17 MED ORDER — OXYCODONE HCL ER 40 MG PO T12A: 40.0000 mg | EXTENDED_RELEASE_TABLET | Freq: Three times a day (TID) | ORAL | 0 refills | Status: DC | PRN

## 2022-08-17 NOTE — Telephone Encounter (Signed)
Requested medications are due for refill today.  unsure  Requested medications are on the active medications list.  yes  Last refill. 05/21/2022 #270 0 refills  Future visit scheduled.   no  Notes to clinic.  Refill is not delegated.    Requested Prescriptions  Pending Prescriptions Disp Refills   oxyCODONE (OXYCONTIN) 40 mg 12 hr tablet 270 tablet 0    Sig: Take 1 tablet (40 mg total) by mouth every 8 (eight) hours as needed.     Not Delegated - Analgesics:  Opioid Agonists Failed - 08/16/2022  1:19 PM      Failed - This refill cannot be delegated      Failed - Urine Drug Screen completed in last 360 days      Passed - Valid encounter within last 3 months    Recent Outpatient Visits           2 months ago HYPERTENSION, [place] Jerrol Banana., MD   7 months ago Tinea corporis   [place], Kirstie Peri, MD   8 months ago Respiratory tract infection   [place], Dani Gobble, PA-C   9 months ago Chronic left shoulder pain   [facility] Jerrol Banana., MD   11 months ago Diabetes mellitus type 2, uncontrolled, with complications [facility])   [facility] Jerrol Banana., MD

## 2022-08-20 ENCOUNTER — Telehealth: Payer: Self-pay

## 2022-08-20 NOTE — Chronic Care Management (AMB) (Signed)
  Chronic Care Management   Note  08/20/2022 Name: Jerry Parsons MRN: [id number] DOB: June 27, 1951  Jerry Parsons is a 71 y.o. year old male who is a primary care patient of Jerrol Banana., MD. I reached out to Fortunato Curling by phone today in response to a referral sent by Jerry Parsons PCP.  Jerry Parsons was given information about Chronic Care Management services today including:  CCM service includes personalized support from designated clinical staff supervised by his physician, including individualized plan of care and coordination with other care providers 24/7 contact phone numbers for assistance for urgent and routine care needs. Service will only be billed when office clinical staff spend 20 minutes or more in a month to coordinate care. Only one practitioner may furnish and bill the service in a calendar month. The patient may stop CCM services at any time (effective at the end of the month) by phone call to the office staff. The patient is responsible for co-pay (up to 20% after annual deductible is met) if co-pay is required by the individual health plan.   Patient did not agree to enrollment in care management services and does not wish to consider at this time.  Follow up plan: Patient declines further follow up and engagement by the care management team. Appropriate care team members and provider have been notified via electronic communication.   Jerry Parsons, [place], [place] [phone] Direct Dial: [phone] [email or web address]

## 2022-08-20 NOTE — Progress Notes (Unsigned)
I,Tiffany J Bragg,acting as a scribe for [name], MD.,have documented all relevant documentation on the behalf of [name], MD,as directed by  [name], MD while in the presence of [name], MD.   Established patient visit   Patient: Jerry Parsons   DOB: Apr 30, 1951   71 y.o. Male  MRN: [id number] Visit Date: 08/23/2022  Today's healthcare provider: [name], MD   Chief Complaint  Patient presents with   Pain    Patient complains of L arm pain, L knee pain and back pain. L arm pain started about a month and a half ago.    Subjective    HPI HPI     Pain    Additional comments: Patient complains of L arm pain, L knee pain and back pain. L arm pain started about a month and a half ago.       Last edited by Smitty Knudsen, CMA on 08/23/2022  1:08 PM.      Patient states he was lifting something heavy a month ago and felt a pop in his forearm and the flexor area.  Since then he has had pain.  It is improving over the past couple of weeks. His other chronic pains are stable. Overall he is feeling fairly well.  Medications: Outpatient Medications Prior to Visit  Medication Sig   amiodarone (PACERONE) 200 MG tablet TAKE 1 TABLET BY MOUTH EVERY DAY   Apoaequorin (PREVAGEN) 10 MG CAPS Take 10 mg by mouth daily.   aspirin EC 81 MG tablet Take 81 mg by mouth daily. Swallow whole.   Continuous Blood Gluc Sensor (FREESTYLE LIBRE 2 SENSOR) MISC USE TO CHECK BLOOD SUGAR AS DIRECTED   cyclobenzaprine (FLEXERIL) 10 MG tablet TAKE 1 TABLET BY MOUTH 3 TIMES A DAY   dutasteride (AVODART) 0.5 MG capsule TAKE 1 CAPSULE DAILY   ELIQUIS 5 MG TABS tablet TAKE 1 TABLET TWICE A DAY   esomeprazole (NEXIUM) 40 MG capsule TAKE 1 CAPSULE TWICE DAILY   FARXIGA 10 MG TABS tablet TAKE 1 TABLET BY MOUTH EVERY DAY   ferrous sulfate 325 (65 FE) MG tablet Take 1 tablet by mouth daily.   fexofenadine (ALLEGRA) 180 MG tablet Take 180 mg by mouth daily.    furosemide (LASIX) 40 MG tablet TAKE 1 TABLET BY MOUTH EVERY DAY   gabapentin (NEURONTIN) 100 MG capsule TAKE 1 CAPSULE BY MOUTH TWICE A DAY   gabapentin (NEURONTIN) 300 MG capsule Take 300 mg by mouth at bedtime.   Insulin Glargine (BASAGLAR KWIKPEN) 100 UNIT/ML Inject 40 Units into the skin 2 (two) times daily.   JANUVIA 100 MG tablet TAKE 1 TABLET DAILY   magnesium oxide (MAG-OX) 400 MG tablet TAKE 1 TABLET BY MOUTH TWICE A DAY   metoprolol tartrate (LOPRESSOR) 100 MG tablet TAKE 1 TABLET BY MOUTH TWICE A DAY   Omega-3 Fatty Acids (FISH OIL) 1200 MG CAPS Take 1,200 mg by mouth 2 (two) times daily.    oxyCODONE (OXYCONTIN) 40 mg 12 hr tablet Take 1 tablet (40 mg total) by mouth every 8 (eight) hours as needed.   pregabalin (LYRICA) 200 MG capsule TAKE 1 CAPSULE 3 TIMES A   DAY   rOPINIRole (REQUIP) 1 MG tablet TAKE 1 TO 2 TABLETS BY MOUTH AT BEDTIME AS NEEDED   rOPINIRole (REQUIP) 2 MG tablet TAKE 1 TABLET BY MOUTH TWICE A DAY AS NEEDED   sucralfate (CARAFATE) 1 g tablet TAKE 1 TABLET  BY MOUTH 4 (FOUR) TIMES DAILY - WITH MEALS AND AT BEDTIME.   tamsulosin (FLOMAX) 0.4 MG CAPS capsule Take 0.4 mg by mouth daily.   testosterone cypionate (DEPOTESTOSTERONE CYPIONATE) 200 MG/ML injection Inject 200 mg into the muscle every 21 ( twenty-one) days.    VICTOZA 18 MG/3ML SOPN INJECT 1.'8MG'$  SUBCUTANEOUSLYDAILY   clobetasol ointment (TEMOVATE) 0.05 % Apply topically 2 (two) times daily. (Patient not taking: Reported on 05/14/2022)   NOVOLOG FLEXPEN 100 UNIT/ML FlexPen INJECT 14 UNITS            SUBCUTANEOUSLY IN THE      MORNING AND AT BEDTIME   sildenafil (REVATIO) 20 MG tablet Take 1 tablet (20 mg total) by mouth in the morning and at bedtime.   Turmeric (QC TUMERIC COMPLEX PO) Take by mouth daily.   VITAMIN D PO Take by mouth daily at 2 am.   No facility-administered medications prior to visit.    Review of Systems  Constitutional:  Negative for appetite change, chills and fever.  Respiratory:   Negative for chest tightness, shortness of breath and wheezing.   Cardiovascular:  Negative for chest pain and palpitations.  Gastrointestinal:  Negative for abdominal pain, nausea and vomiting.    Last hemoglobin A1c Lab Results  Component Value Date   HGBA1C 7.2 (H) 05/14/2022       Objective    BP (!) 116/53 (BP Location: Right Arm, Patient Position: Sitting, Cuff Size: Normal)   Pulse 65   Resp 16   Ht '5\' 8"'$  (1.727 m)   Wt 272 lb (123.4 kg)   SpO2 96%   BMI 41.36 kg/m  BP Readings from Last 3 Encounters:  08/23/22 (!) 116/53  05/31/22 (!) 117/54  05/14/22 130/62   Wt Readings from Last 3 Encounters:  08/23/22 272 lb (123.4 kg)  05/31/22 270 lb (122.5 kg)  05/14/22 263 lb 8 oz (119.5 kg)      Physical Exam Vitals reviewed.  Constitutional:      General: He is not in acute distress.    Appearance: He is well-developed.  HENT:     Head: Normocephalic and atraumatic.     Right Ear: Hearing normal.     Left Ear: Hearing normal.     Nose: Nose normal.  Eyes:     General: Lids are normal. No scleral icterus.       Right eye: No discharge.        Left eye: No discharge.     Conjunctiva/sclera: Conjunctivae normal.  Cardiovascular:     Rate and Rhythm: Normal rate and regular rhythm.     Heart sounds: Normal heart sounds.  Pulmonary:     Effort: Pulmonary effort is normal. No respiratory distress.  Musculoskeletal:     Comments: He has some mild tenderness of the medial epicondyles of the left elbow and then the proximal flexor muscles/tendons of the same arm.  Skin:    General: Skin is warm and dry.     Findings: No lesion or rash.  Neurological:     General: No focal deficit present.     Mental Status: He is alert and oriented to person, place, and time.  Psychiatric:        Mood and Affect: Mood normal.        Speech: Speech normal.        Behavior: Behavior normal.        Thought Content: Thought content normal.        Judgment: Judgment  normal.        No results found for any visits on 08/23/22.  Assessment & Plan     1. Muscle strain of left forearm, initial encounter At this point time I do not think he needs x-rays or orthopedic intervention.  Will refer to orthopedics if he worsens.  Presently would recommend heating pad.  2. Type 2 diabetes mellitus with diabetic polyneuropathy, with long-term current use of insulin (HCC) Last A1c was good at 7.2  3. Paroxysmal atrial fibrillation (HCC)   4. Spinal stenosis of lumbar region, unspecified whether neurogenic claudication present   5. OSA on CPAP Continue CPAP nightly.   No follow-ups on file.      I, [name], MD, have reviewed all documentation for this visit. The documentation on 08/24/22 for the exam, diagnosis, procedures, and orders are all accurate and complete.    Lauralei Clouse Cranford Mon, MD  [facility] [phone] (phone) [phone] (fax)  [place]

## 2022-08-23 ENCOUNTER — Ambulatory Visit (INDEPENDENT_AMBULATORY_CARE_PROVIDER_SITE_OTHER): Payer: Medicare Other | Admitting: Family Medicine

## 2022-08-23 ENCOUNTER — Encounter: Payer: Self-pay | Admitting: Family Medicine

## 2022-08-23 VITALS — BP 116/53 | HR 65 | Resp 16 | Ht 68.0 in | Wt 272.0 lb

## 2022-08-23 DIAGNOSIS — M48061 Spinal stenosis, lumbar region without neurogenic claudication: Secondary | ICD-10-CM

## 2022-08-23 DIAGNOSIS — E1142 Type 2 diabetes mellitus with diabetic polyneuropathy: Secondary | ICD-10-CM | POA: Diagnosis not present

## 2022-08-23 DIAGNOSIS — S56912A Strain of unspecified muscles, fascia and tendons at forearm level, left arm, initial encounter: Secondary | ICD-10-CM | POA: Diagnosis not present

## 2022-08-23 DIAGNOSIS — Z794 Long term (current) use of insulin: Secondary | ICD-10-CM | POA: Diagnosis not present

## 2022-08-23 DIAGNOSIS — I480 Paroxysmal atrial fibrillation: Secondary | ICD-10-CM

## 2022-08-23 DIAGNOSIS — G4733 Obstructive sleep apnea (adult) (pediatric): Secondary | ICD-10-CM | POA: Diagnosis not present

## 2022-08-23 DIAGNOSIS — Z9989 Dependence on other enabling machines and devices: Secondary | ICD-10-CM

## 2022-08-24 DIAGNOSIS — M1712 Unilateral primary osteoarthritis, left knee: Secondary | ICD-10-CM | POA: Diagnosis not present

## 2022-09-07 ENCOUNTER — Other Ambulatory Visit: Payer: Self-pay | Admitting: Medical

## 2022-09-07 ENCOUNTER — Other Ambulatory Visit: Payer: Self-pay | Admitting: Family Medicine

## 2022-09-07 DIAGNOSIS — G2581 Restless legs syndrome: Secondary | ICD-10-CM

## 2022-09-07 DIAGNOSIS — G629 Polyneuropathy, unspecified: Secondary | ICD-10-CM

## 2022-09-07 DIAGNOSIS — G8929 Other chronic pain: Secondary | ICD-10-CM

## 2022-09-10 ENCOUNTER — Other Ambulatory Visit: Payer: Self-pay | Admitting: Family Medicine

## 2022-09-11 ENCOUNTER — Other Ambulatory Visit: Payer: Self-pay | Admitting: Family Medicine

## 2022-09-11 DIAGNOSIS — E118 Type 2 diabetes mellitus with unspecified complications: Secondary | ICD-10-CM

## 2022-09-24 DIAGNOSIS — M1712 Unilateral primary osteoarthritis, left knee: Secondary | ICD-10-CM | POA: Diagnosis not present

## 2022-10-04 ENCOUNTER — Other Ambulatory Visit: Payer: Self-pay | Admitting: Family Medicine

## 2022-10-04 DIAGNOSIS — E118 Type 2 diabetes mellitus with unspecified complications: Secondary | ICD-10-CM

## 2022-10-06 ENCOUNTER — Other Ambulatory Visit: Payer: Self-pay | Admitting: Family Medicine

## 2022-10-06 NOTE — Telephone Encounter (Signed)
Requested medication (s) are due for refill today: yes  Requested medication (s) are on the active medication list: yes  Last refill:  01/20/22 45 ml with 3 RF  Future visit scheduled: 12/01/22, seen 08/23/22  Notes to clinic:  Dr Alben Spittle pt, please assess.      Requested Prescriptions  Pending Prescriptions Disp Refills   Insulin Glargine (BASAGLAR KWIKPEN) 100 UNIT/ML [Pharmacy Med Name: BASAGLAR KWK PEN 100U/ML] 45 mL 3    Sig: INJECT [place] A DAY     Endocrinology:  Diabetes - Insulins Passed - 10/06/2022  1:16 PM      Passed - HBA1C is between 0 and 7.9 and within 180 days    Hgb A1c MFr Bld  Date Value Ref Range Status  05/14/2022 7.2 (H) 4.8 - 5.6 % Final    Comment:    (NOTE) Pre diabetes:          5.7%-6.4%  Diabetes:              >6.4%  Glycemic control for   <7.0% adults with diabetes          Passed - Valid encounter within last 6 months    Recent Outpatient Visits           1 month ago Muscle strain of left forearm, initial encounter   [facility] Jerrol Banana., MD   4 months ago HYPERTENSION, [place] Jerrol Banana., MD   8 months ago Tinea corporis   [facility] Birdie Sons, MD   9 months ago Respiratory tract infection   [facility] Mecum, Dani Gobble, PA-C   11 months ago Chronic left shoulder pain   [facility] Jerrol Banana., MD

## 2022-10-07 NOTE — Telephone Encounter (Signed)
Patient LOV: 08/23/2022 NOV: 12/01/2022 but it is with Dr. [name]

## 2022-10-20 ENCOUNTER — Ambulatory Visit: Payer: Medicare Other | Admitting: Podiatry

## 2022-10-21 ENCOUNTER — Ambulatory Visit (INDEPENDENT_AMBULATORY_CARE_PROVIDER_SITE_OTHER): Payer: Medicare Other | Admitting: Podiatry

## 2022-10-21 DIAGNOSIS — E1142 Type 2 diabetes mellitus with diabetic polyneuropathy: Secondary | ICD-10-CM | POA: Diagnosis not present

## 2022-10-21 DIAGNOSIS — I739 Peripheral vascular disease, unspecified: Secondary | ICD-10-CM

## 2022-10-21 DIAGNOSIS — B351 Tinea unguium: Secondary | ICD-10-CM

## 2022-10-21 DIAGNOSIS — M79674 Pain in right toe(s): Secondary | ICD-10-CM

## 2022-10-21 DIAGNOSIS — M79675 Pain in left toe(s): Secondary | ICD-10-CM | POA: Diagnosis not present

## 2022-10-21 NOTE — Progress Notes (Signed)
  Subjective:  Patient ID: Jerry Parsons, male    DOB: January 16, 1951,  MRN: [id number]  Chief Complaint  Patient presents with   Nail Problem    Diabetic Foot Care- Patient blood sugar this morning was 135.     71 y.o. male presents with the above complaint. History confirmed with patient. Patient presenting with pain related to dystrophic thickened elongated nails. Patient is unable to trim own nails related to nail dystrophy and/or mobility issues. 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   Objective:  Physical Exam: warm, good capillary refill nail exam onychomycosis of the toenails, onycholysis, and dystrophic nails DP pulses palpable, PT pulses palpable, and protective sensation absent Left Foot:  Pain with palpation of nails due to elongation and dystrophic growth.  Right Foot: Pain with palpation of nails due to elongation and dystrophic growth.   Assessment:   1. Pain due to onychomycosis of toenails of both feet   2. Diabetic polyneuropathy associated with type 2 diabetes mellitus ([place])   3. PAD (peripheral artery disease) ([place])      Plan:  Patient was evaluated and treated and all questions answered.  #Onychomycosis with pain  -Nails palliatively debrided as below. -Educated on self-care  Procedure: Nail Debridement Rationale: Pain Type of Debridement: manual, sharp debridement. Instrumentation: Nail nipper, rotary burr. Number of Nails: 10  Return in about 3 months (around 01/21/2023) for [facility].         Everitt Amber, DPM [facility]

## 2022-10-23 DIAGNOSIS — Z23 Encounter for immunization: Secondary | ICD-10-CM | POA: Diagnosis not present

## 2022-10-25 ENCOUNTER — Encounter (INDEPENDENT_AMBULATORY_CARE_PROVIDER_SITE_OTHER): Payer: Self-pay

## 2022-10-30 DIAGNOSIS — Z23 Encounter for immunization: Secondary | ICD-10-CM | POA: Diagnosis not present

## 2022-11-03 ENCOUNTER — Ambulatory Visit (INDEPENDENT_AMBULATORY_CARE_PROVIDER_SITE_OTHER): Payer: Medicare Other | Admitting: Family Medicine

## 2022-11-03 ENCOUNTER — Encounter: Payer: Self-pay | Admitting: Family Medicine

## 2022-11-03 VITALS — BP 144/65 | HR 72 | Resp 16 | Ht 70.0 in | Wt 270.0 lb

## 2022-11-03 DIAGNOSIS — M7021 Olecranon bursitis, right elbow: Secondary | ICD-10-CM

## 2022-11-03 MED ORDER — CEPHALEXIN 500 MG PO CAPS: 500.0000 mg | ORAL_CAPSULE | Freq: Four times a day (QID) | ORAL | 0 refills | Status: DC

## 2022-11-03 NOTE — Progress Notes (Signed)
I,Tiffany J Bragg,acting as a scribe for Lelon Huh, MD.,have documented all relevant documentation on the behalf of Lelon Huh, MD,as directed by  Lelon Huh, MD while in the presence of Lelon Huh, MD.   Established patient visit   Patient: Jerry Parsons   DOB: August 22, 1951   71 y.o. Male  MRN: [id number] Visit Date: 11/03/2022  Today's healthcare provider: Lelon Huh, MD    Subjective    HPI   Patient reports swelling of right elbow onset two weeks ago that has been sore and tender. Was egg sized, but has gone down significantly. No known trauma.   Medications: Outpatient Medications Prior to Visit  Medication Sig   amiodarone (PACERONE) 200 MG tablet TAKE 1 TABLET BY MOUTH EVERY DAY   Apoaequorin (PREVAGEN) 10 MG CAPS Take 10 mg by mouth daily.   aspirin EC 81 MG tablet Take 81 mg by mouth daily. Swallow whole.   Continuous Blood Gluc Sensor (FREESTYLE LIBRE 2 SENSOR) MISC USE TO CHECK BLOOD SUGAR AS DIRECTED   cyclobenzaprine (FLEXERIL) 10 MG tablet TAKE 1 TABLET BY MOUTH THREE TIMES A DAY   dutasteride (AVODART) 0.5 MG capsule TAKE 1 CAPSULE DAILY   ELIQUIS 5 MG TABS tablet TAKE 1 TABLET TWICE A DAY   esomeprazole (NEXIUM) 40 MG capsule TAKE 1 CAPSULE TWICE DAILY   FARXIGA 10 MG TABS tablet TAKE 1 TABLET BY MOUTH EVERY DAY   ferrous sulfate 325 (65 FE) MG tablet Take 1 tablet by mouth daily.   fexofenadine (ALLEGRA) 180 MG tablet Take 180 mg by mouth daily.   furosemide (LASIX) 40 MG tablet TAKE 1 TABLET BY MOUTH EVERY DAY   gabapentin (NEURONTIN) 100 MG capsule TAKE 1 CAPSULE BY MOUTH TWICE A DAY   gabapentin (NEURONTIN) 300 MG capsule Take 300 mg by mouth at bedtime.   Insulin Glargine (BASAGLAR KWIKPEN) 100 UNIT/ML INJECT 40 UNITS            SUBCUTANEOUSLY TWO TIMES A DAY   JANUVIA 100 MG tablet TAKE 1 TABLET DAILY   magnesium oxide (MAG-OX) 400 MG tablet TAKE 1 TABLET BY MOUTH TWICE A DAY   metoprolol tartrate (LOPRESSOR) 100 MG tablet TAKE 1 TABLET  BY MOUTH TWICE A DAY   Omega-3 Fatty Acids (FISH OIL) 1200 MG CAPS Take 1,200 mg by mouth 2 (two) times daily.    ONETOUCH ULTRA test strip USE WITH METER TWICE DAILY   oxyCODONE (OXYCONTIN) 40 mg 12 hr tablet Take 1 tablet (40 mg total) by mouth every 8 (eight) hours as needed.   pregabalin (LYRICA) 200 MG capsule TAKE 1 CAPSULE 3 TIMES A   DAY   rOPINIRole (REQUIP) 1 MG tablet TAKE 1 TO 2 TABLETS BY MOUTH AT BEDTIME AS NEEDED   rOPINIRole (REQUIP) 2 MG tablet TAKE 1 TABLET BY MOUTH TWICE A DAY AS NEEDED   sildenafil (REVATIO) 20 MG tablet Take 1 tablet (20 mg total) by mouth in the morning and at bedtime.   sucralfate (CARAFATE) 1 g tablet TAKE 1 TABLET BY MOUTH 4 (FOUR) TIMES DAILY - WITH MEALS AND AT BEDTIME.   tamsulosin (FLOMAX) 0.4 MG CAPS capsule Take 0.4 mg by mouth daily.   testosterone cypionate (DEPOTESTOSTERONE CYPIONATE) 200 MG/ML injection Inject 200 mg into the muscle every 21 ( twenty-one) days.    VICTOZA 18 MG/3ML SOPN INJECT 1.'8MG'$  SUBCUTANEOUSLYDAILY   clobetasol ointment (TEMOVATE) 0.05 % Apply topically 2 (two) times daily. (Patient not taking: Reported on 05/14/2022)   NOVOLOG  FLEXPEN 100 UNIT/ML FlexPen INJECT 14 UNITS            SUBCUTANEOUSLY IN THE      MORNING AND AT BEDTIME   Turmeric (QC TUMERIC COMPLEX PO) Take by mouth daily.   VITAMIN D PO Take by mouth daily at 2 am.   No facility-administered medications prior to visit.    Review of Systems  Constitutional:  Negative for appetite change, chills and fever.  Respiratory:  Negative for chest tightness, shortness of breath and wheezing.   Cardiovascular:  Negative for chest pain and palpitations.  Gastrointestinal:  Negative for abdominal pain, nausea and vomiting.       Objective    BP (!) 144/65 (BP Location: Left Arm, Patient Position: Sitting, Cuff Size: Large)   Pulse 72   Resp 16   Ht '5\' 10"'$  (1.778 m)   Wt 270 lb (122.5 kg)   SpO2 98%   BMI 38.74 kg/m    Physical Exam  Firm fluctuate  swelling over right olecranon, tender to touch, very faint erythema. Patch or psoriasis over elbow.    Assessment & Plan     1. Olecranon bursitis of right elbow Prepped with isopropyl alcohol and inserted 1 1/2 inch 23 gauge needed with and aspirated about 3 cc serosanguinous material. Unable to to aspirate any additional fluid.   - cephALEXin (KEFLEX) 500 MG capsule; Take 1 capsule (500 mg total) by mouth 4 (four) times daily for 10 days.  Dispense: 40 capsule; Refill: 0  - Aerobic culture      The entirety of the information documented in the History of Present Illness, Review of Systems and Physical Exam were personally obtained by me. Portions of this information were initially documented by the CMA and reviewed by me for thoroughness and accuracy.     Lelon Huh, MD  [facility] [phone] (phone) [phone] (fax)  [place]

## 2022-11-04 ENCOUNTER — Other Ambulatory Visit: Payer: Self-pay | Admitting: Family Medicine

## 2022-11-04 DIAGNOSIS — M48061 Spinal stenosis, lumbar region without neurogenic claudication: Secondary | ICD-10-CM

## 2022-11-04 MED ORDER — OXYCODONE HCL ER 40 MG PO T12A: 40.0000 mg | EXTENDED_RELEASE_TABLET | Freq: Three times a day (TID) | ORAL | 0 refills | Status: DC | PRN

## 2022-11-04 NOTE — Telephone Encounter (Signed)
Medication Refill - Medication: oxyCODONE (OXYCONTIN) 40 mg 12 hr tablet   Has the patient contacted their pharmacy? No.  Preferred Pharmacy (with phone number or street name):  [facility], [place] to [facility] [facility] Phone: [phone]  Fax: [phone]     Has the patient been seen for an appointment in the last year OR does the patient have an upcoming appointment? Yes.    Agent: Please be advised that RX refills may take up to 3 business days. We ask that you follow-up with your pharmacy.

## 2022-11-04 NOTE — Telephone Encounter (Signed)
Requested medication (s) are due for refill today: yes  Requested medication (s) are on the active medication list: yes  Last refill:  08/17/22  Future visit scheduled: yes  Notes to clinic:  Unable to refill per protocol, cannot delegate.      Requested Prescriptions  Pending Prescriptions Disp Refills   oxyCODONE (OXYCONTIN) 40 mg 12 hr tablet 270 tablet 0    Sig: Take 1 tablet (40 mg total) by mouth every 8 (eight) hours as needed.     There is no refill protocol information for this order

## 2022-11-05 DIAGNOSIS — E291 Testicular hypofunction: Secondary | ICD-10-CM | POA: Diagnosis not present

## 2022-11-05 DIAGNOSIS — Z79899 Other long term (current) drug therapy: Secondary | ICD-10-CM | POA: Diagnosis not present

## 2022-11-07 LAB — BODY FLUID CULTURE

## 2022-11-07 LAB — SPECIMEN STATUS REPORT

## 2022-11-08 ENCOUNTER — Telehealth: Payer: Self-pay

## 2022-11-08 NOTE — Telephone Encounter (Signed)
Patient aware of results and recommendations. °

## 2022-11-08 NOTE — Telephone Encounter (Signed)
-----   Message from Birdie Sons, MD sent at 11/07/2022  6:41 PM EST ----- Culture of drainage from elbow is negative. Continue cephalexin prescription that was prescribed last week. Need to follow up Friday to recheck elbow to make sure infection is resolving

## 2022-11-12 ENCOUNTER — Encounter: Payer: Self-pay | Admitting: Family Medicine

## 2022-11-12 ENCOUNTER — Ambulatory Visit (INDEPENDENT_AMBULATORY_CARE_PROVIDER_SITE_OTHER): Payer: Medicare Other | Admitting: Family Medicine

## 2022-11-12 DIAGNOSIS — G629 Polyneuropathy, unspecified: Secondary | ICD-10-CM | POA: Diagnosis not present

## 2022-11-12 DIAGNOSIS — M7021 Olecranon bursitis, right elbow: Secondary | ICD-10-CM

## 2022-11-12 MED ORDER — GABAPENTIN 100 MG PO CAPS: 100.0000 mg | ORAL_CAPSULE | Freq: Two times a day (BID) | ORAL | 3 refills | Status: DC

## 2022-11-12 MED ORDER — CEPHALEXIN 500 MG PO CAPS: 500.0000 mg | ORAL_CAPSULE | Freq: Four times a day (QID) | ORAL | 0 refills | Status: AC

## 2022-11-12 MED ORDER — GABAPENTIN 300 MG PO CAPS: 300.0000 mg | ORAL_CAPSULE | Freq: Every day | ORAL | 1 refills | Status: AC

## 2022-11-12 NOTE — Progress Notes (Signed)
I,Roshena L Chambers,acting as a scribe for Lelon Huh, MD.,have documented all relevant documentation on the behalf of Lelon Huh, MD,as directed by  Lelon Huh, MD while in the presence of Lelon Huh, MD.    Established patient visit   Patient: Jerry Parsons   DOB: September 07, 1951   71 y.o. Male  MRN: [id number] Visit Date: 11/12/2022  Today's healthcare provider: Lelon Huh, MD   Chief Complaint  Patient presents with   Follow-up   Subjective    HPI  Follow up for Olecranon bursitis of right elbow:  The patient was last seen for this 11/03/2022.   Changes made at last visit include prescribing Keflex. Drainage culture obtained and was negative.  He reports good compliance with treatment. He feels that condition is Improved, but not resolved He is not having side effects.   -----------------------------------------------------------------------------------------   Medications: Outpatient Medications Prior to Visit  Medication Sig   amiodarone (PACERONE) 200 MG tablet TAKE 1 TABLET BY MOUTH EVERY DAY   Apoaequorin (PREVAGEN) 10 MG CAPS Take 10 mg by mouth daily.   aspirin EC 81 MG tablet Take 81 mg by mouth daily. Swallow whole.   cephALEXin (KEFLEX) 500 MG capsule Take 1 capsule (500 mg total) by mouth 4 (four) times daily for 10 days.   Continuous Blood Gluc Sensor (FREESTYLE LIBRE 2 SENSOR) MISC USE TO CHECK BLOOD SUGAR AS DIRECTED   cyclobenzaprine (FLEXERIL) 10 MG tablet TAKE 1 TABLET BY MOUTH THREE TIMES A DAY   dutasteride (AVODART) 0.5 MG capsule TAKE 1 CAPSULE DAILY   ELIQUIS 5 MG TABS tablet TAKE 1 TABLET TWICE A DAY   esomeprazole (NEXIUM) 40 MG capsule TAKE 1 CAPSULE TWICE DAILY   FARXIGA 10 MG TABS tablet TAKE 1 TABLET BY MOUTH EVERY DAY   ferrous sulfate 325 (65 FE) MG tablet Take 1 tablet by mouth daily.   fexofenadine (ALLEGRA) 180 MG tablet Take 180 mg by mouth daily.   furosemide (LASIX) 40 MG tablet TAKE 1 TABLET BY MOUTH EVERY DAY    gabapentin (NEURONTIN) 100 MG capsule TAKE 1 CAPSULE BY MOUTH TWICE A DAY   gabapentin (NEURONTIN) 300 MG capsule Take 300 mg by mouth at bedtime.   Insulin Glargine (BASAGLAR KWIKPEN) 100 UNIT/ML INJECT 40 UNITS            SUBCUTANEOUSLY TWO TIMES A DAY   JANUVIA 100 MG tablet TAKE 1 TABLET DAILY   magnesium oxide (MAG-OX) 400 MG tablet TAKE 1 TABLET BY MOUTH TWICE A DAY   metoprolol tartrate (LOPRESSOR) 100 MG tablet TAKE 1 TABLET BY MOUTH TWICE A DAY   Omega-3 Fatty Acids (FISH OIL) 1200 MG CAPS Take 1,200 mg by mouth 2 (two) times daily.    ONETOUCH ULTRA test strip USE WITH METER TWICE DAILY   oxyCODONE (OXYCONTIN) 40 mg 12 hr tablet Take 1 tablet (40 mg total) by mouth every 8 (eight) hours as needed.   pregabalin (LYRICA) 200 MG capsule TAKE 1 CAPSULE 3 TIMES A   DAY   rOPINIRole (REQUIP) 1 MG tablet TAKE 1 TO 2 TABLETS BY MOUTH AT BEDTIME AS NEEDED   rOPINIRole (REQUIP) 2 MG tablet TAKE 1 TABLET BY MOUTH TWICE A DAY AS NEEDED   sildenafil (REVATIO) 20 MG tablet Take 1 tablet (20 mg total) by mouth in the morning and at bedtime.   sucralfate (CARAFATE) 1 g tablet TAKE 1 TABLET BY MOUTH 4 (FOUR) TIMES DAILY - WITH MEALS AND AT BEDTIME.   tamsulosin (  FLOMAX) 0.4 MG CAPS capsule Take 0.4 mg by mouth daily.   testosterone cypionate (DEPOTESTOSTERONE CYPIONATE) 200 MG/ML injection Inject 200 mg into the muscle every 21 ( twenty-one) days.    VICTOZA 18 MG/3ML SOPN INJECT 1.'8MG'$  SUBCUTANEOUSLYDAILY   clobetasol ointment (TEMOVATE) 0.05 % Apply topically 2 (two) times daily.   NOVOLOG FLEXPEN 100 UNIT/ML FlexPen INJECT 14 UNITS            SUBCUTANEOUSLY IN THE      MORNING AND AT BEDTIME   Turmeric (QC TUMERIC COMPLEX PO) Take by mouth daily.   [DISCONTINUED] VITAMIN D PO Take by mouth daily at 2 am.   No facility-administered medications prior to visit.    Review of Systems  Constitutional:  Negative for appetite change, chills and fever.  Respiratory:  Negative for chest tightness,  shortness of breath and wheezing.   Cardiovascular:  Negative for chest pain and palpitations.  Gastrointestinal:  Negative for abdominal pain, nausea and vomiting.  Musculoskeletal:  Positive for arthralgias (right elbow).       Objective    BP (!) 126/49 (BP Location: Left Arm, Patient Position: Sitting, Cuff Size: Large)   Pulse 71   Temp 98.7 F (37.1 C) (Oral)   Resp 16   Wt 275 lb (124.7 kg)   SpO2 95% Comment: room air  BMI 39.46 kg/m    Today's Vitals   11/12/22 1436 11/12/22 1441  BP: (!) 130/45 (!) 126/49  Pulse: 72 71  Resp: 16   Temp: 98.7 F (37.1 C)   TempSrc: Oral   SpO2: 95%   Weight: 275 lb (124.7 kg)    Body mass index is 39.46 kg/m.   Physical Exam   Firm fluctuate swelling over right olecranon, slightly tender to touch. Erythema resolved.    Assessment & Plan     1. Olecranon bursitis of right elbow Some mild underlying cellulitis. Improving but not resolved.  refill cephALEXin (KEFLEX) 500 MG capsule; Take 1 capsule (500 mg total) by mouth 4 (four) times daily for 10 days.  Dispense: 40 capsule; Refill: 0  Recommend ACE wrap or elbow sleeve for compression. Advised may take a few months for fluid to be reabsorbed, but soreness should be completely gone when finished with antibiotic. If not will consider orthopedic referral.   2. Neuropathy Refill gabapentin (NEURONTIN) 100 MG capsule; Take 1 capsule (100 mg total) by mouth 2 (two) times daily.  Dispense: 180 capsule; Refill: 3 and gabapentin (NEURONTIN) 300 MG capsule; Take 1 capsule (300 mg total) by mouth at bedtime.  Dispense: 90 capsule; Refill: 1       The entirety of the information documented in the History of Present Illness, Review of Systems and Physical Exam were personally obtained by me. Portions of this information were initially documented by the CMA and reviewed by me for thoroughness and accuracy.     Lelon Huh, MD  [facility] [phone]  (phone) [phone] (fax)  [place]

## 2022-11-29 ENCOUNTER — Telehealth: Payer: Self-pay | Admitting: Family Medicine

## 2022-11-29 DIAGNOSIS — N529 Male erectile dysfunction, unspecified: Secondary | ICD-10-CM

## 2022-11-29 MED ORDER — SILDENAFIL CITRATE 20 MG PO TABS: 20.0000 mg | ORAL_TABLET | Freq: Two times a day (BID) | ORAL | 0 refills | Status: DC

## 2022-11-29 NOTE — Telephone Encounter (Signed)
[facility] pharmacy faxed refill request for the following medications:    sildenafil (REVATIO) 20 MG tablet   Please advise

## 2022-11-29 NOTE — Telephone Encounter (Signed)
Patient needs appoint with new PCP for med management. Please call to schedule him for next available.    Refill for Sildenafil '20mg'$  sent to pharmacy,    Eulis Foster, MD  [facility]

## 2022-11-30 ENCOUNTER — Encounter: Payer: Self-pay | Admitting: Neurosurgery

## 2022-11-30 ENCOUNTER — Ambulatory Visit (INDEPENDENT_AMBULATORY_CARE_PROVIDER_SITE_OTHER): Payer: Medicare Other | Admitting: Neurosurgery

## 2022-11-30 VITALS — BP 149/66 | HR 67 | Ht 71.0 in | Wt 275.4 lb

## 2022-11-30 DIAGNOSIS — M47816 Spondylosis without myelopathy or radiculopathy, lumbar region: Secondary | ICD-10-CM

## 2022-11-30 DIAGNOSIS — M5442 Lumbago with sciatica, left side: Secondary | ICD-10-CM | POA: Diagnosis not present

## 2022-11-30 DIAGNOSIS — G8929 Other chronic pain: Secondary | ICD-10-CM

## 2022-11-30 NOTE — Progress Notes (Signed)
Referring Physician:  Jerrol Banana., MD No address on file  Primary Physician:  Eulis Foster, MD  History of Present Illness: 11/30/2022 [name] is a 71 y.o with a history of  GERD, DVT, DM ( last A1c 7.2), HTN, CKD (Cr 1.43), previous spinal surgery x 9, who is here today with a chief complaint of chronic lumbosacral complaints with a new onset of left buttock and posterior thigh pain.  He states that this started about 2 years ago without any particular inciting event.  His last surgery was in 2015 for low back pain which should provide him with some improvement however he now has radiating left leg pain that is new.  He states that it feels as though his hip and buttock are getting tight like a rubber band and this stops him from being able to ambulate prolonged distances.  This is significantly affecting his ability to walk and his quality of life.  He has not undergone any recent physical therapy or epidural steroid injections however these were not helpful in the past.  In addition of this he describes weakness in his bilateral lower extremities for the last year or so.  His most pressing complaint is his left buttock and hip pain.  Conservative measures:  Physical therapy: None recently Multimodal medical therapy including regular antiinflammatories:   gabapentin, Lyrica, and OxyContin Injections: no recent epidural steroid injections  Past Surgery: Multiple previous spinal surgeries.   Jerry Parsons has no symptoms of cervical myelopathy.  The symptoms are causing a significant impact on the patient's life.   Review of Systems:  A 10 point review of systems is negative, except for the pertinent positives and negatives detailed in the HPI.  Past Medical History: Past Medical History:  Diagnosis Date   Arrhythmia    tachycardia, A-Fib   Arthritis    Blind right eye    BPH (benign prostatic hyperplasia)    Diabetes mellitus    DVT (deep venous  thrombosis) (HCC) 02/2010   leg thrombus ; dislodged into emboli and caused PE   Dyspnea    Dysrhythmia    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    x2   GERD (gastroesophageal reflux disease)    Headache    h/o as a child   Hypercholesteremia    Hypertension    Kidney failure    acute   Neuromuscular disorder (HCC)    Neuropathy    Pneumonia    time 9 ;last episode 12/2015   Pulmonary embolus ([place]) 2011   Seasonal allergies    Seizures ([place])    as child    Sleep apnea    BIPAP   Stiff neck    limited turning s/p titanium plate placement   TIA (transient ischemic attack)    Wears dentures    full upper and lower    Past Surgical History: Past Surgical History:  Procedure Laterality Date   BACK SURGERY     x 8; upper x 3 & lower x 5   CARDIOVERSION  03/14/13, 10/16   2014 - [place], 2016 - [place]   CARDIOVERSION N/A 09/11/2020   Procedure: CARDIOVERSION;  Surgeon: Minna Merritts, MD;  Location: [facility] ORS;  Service: Cardiovascular;  Laterality: N/A;   CATARACT EXTRACTION W/PHACO Left 10/29/2015   Procedure: CATARACT EXTRACTION PHACO AND INTRAOCULAR LENS PLACEMENT ([place]);  Surgeon: Leandrew Koyanagi, MD;  Location: [place];  Service: Ophthalmology;  Laterality: Left;  DIABETIC - insulin and  oral medsSleep apnea - no machine   CHOLECYSTECTOMY     COLONOSCOPY WITH PROPOFOL N/A 01/16/2018   Procedure: COLONOSCOPY WITH PROPOFOL;  Surgeon: Manya Silvas, MD;  Location: [facility] ENDOSCOPY;  Service: Endoscopy;  Laterality: N/A;   ESOPHAGOGASTRODUODENOSCOPY (EGD) WITH PROPOFOL N/A 01/16/2018   Procedure: ESOPHAGOGASTRODUODENOSCOPY (EGD) WITH PROPOFOL;  Surgeon: Manya Silvas, MD;  Location: [facility] END[facility]COPY;  Service: Endoscopy;  Laterality: N/A;   EYE SURGERY     GALLBLADDER SURGERY  2002   JOINT REPLACEMENT Right 2018   KNEE ARTHROSCOPY     left    PARATHYROIDECTOMY N/A 07/25/2020   Procedure: PARATHYROIDECTOMY;  Surgeon: Fredirick Maudlin, MD;  Location: [facility] ORS;   Service: General;  Laterality: N/A;   ROTATOR CUFF REPAIR  2001   left    TEE WITHOUT CARDIOVERSION N/A 04/26/2018   Procedure: TRANSESOPHAGEAL ECHOCARDIOGRAM (TEE);  Surgeon: Nelva Bush, MD;  Location: [facility] ORS;  Service: Cardiovascular;  Laterality: N/A;   TONSILLECTOMY     TOTAL KNEE ARTHROPLASTY Right 06/20/2017   Procedure: RIGHT TOTAL KNEE ARTHROPLASTY;  Surgeon: Gaynelle Arabian, MD;  Location: [facility];  Service: Orthopedics;  Laterality: Right;    Allergies: Allergies as of 11/30/2022 - Review Complete 11/12/2022  Allergen Reaction Noted   Carisoprodol Itching 07/11/2015   Tamsulosin  04/30/2020    Medications: Outpatient Encounter Medications as of 11/30/2022  Medication Sig   amiodarone (PACERONE) 200 MG tablet TAKE 1 TABLET BY MOUTH EVERY DAY   Apoaequorin (PREVAGEN) 10 MG CAPS Take 10 mg by mouth daily.   aspirin EC 81 MG tablet Take 81 mg by mouth daily. Swallow whole.   clobetasol ointment (TEMOVATE) 0.05 % Apply topically 2 (two) times daily.   Continuous Blood Gluc Sensor (FREESTYLE LIBRE 2 SENSOR) MISC USE TO CHECK BLOOD SUGAR AS DIRECTED   cyclobenzaprine (FLEXERIL) 10 MG tablet TAKE 1 TABLET BY MOUTH THREE TIMES A DAY   dutasteride (AVODART) 0.5 MG capsule TAKE 1 CAPSULE DAILY   ELIQUIS 5 MG TABS tablet TAKE 1 TABLET TWICE A DAY   esomeprazole (NEXIUM) 40 MG capsule TAKE 1 CAPSULE TWICE DAILY   FARXIGA 10 MG TABS tablet TAKE 1 TABLET BY MOUTH EVERY DAY   ferrous sulfate 325 (65 FE) MG tablet Take 1 tablet by mouth daily.   fexofenadine (ALLEGRA) 180 MG tablet Take 180 mg by mouth daily.   furosemide (LASIX) 40 MG tablet TAKE 1 TABLET BY MOUTH EVERY DAY   gabapentin (NEURONTIN) 100 MG capsule Take 1 capsule (100 mg total) by mouth 2 (two) times daily.   gabapentin (NEURONTIN) 300 MG capsule Take 1 capsule (300 mg total) by mouth at bedtime.   Insulin Glargine (BASAGLAR KWIKPEN) 100 UNIT/ML INJECT 40 UNITS            SUBCUTANEOUSLY TWO TIMES A DAY   JANUVIA 100 MG  tablet TAKE 1 TABLET DAILY   magnesium oxide (MAG-OX) 400 MG tablet TAKE 1 TABLET BY MOUTH TWICE A DAY   metoprolol tartrate (LOPRESSOR) 100 MG tablet TAKE 1 TABLET BY MOUTH TWICE A DAY   NOVOLOG FLEXPEN 100 UNIT/ML FlexPen INJECT 14 UNITS            SUBCUTANEOUSLY IN THE      MORNING AND AT BEDTIME   Omega-3 Fatty Acids (FISH OIL) 1200 MG CAPS Take 1,200 mg by mouth 2 (two) times daily.    ONETOUCH ULTRA test strip USE WITH METER TWICE DAILY   oxyCODONE (OXYCONTIN) 40 mg 12 hr tablet Take 1 tablet (40  mg total) by mouth every 8 (eight) hours as needed.   pregabalin (LYRICA) 200 MG capsule TAKE 1 CAPSULE 3 TIMES A   DAY   rOPINIRole (REQUIP) 1 MG tablet TAKE 1 TO 2 TABLETS BY MOUTH AT BEDTIME AS NEEDED   rOPINIRole (REQUIP) 2 MG tablet TAKE 1 TABLET BY MOUTH TWICE A DAY AS NEEDED   sildenafil (REVATIO) 20 MG tablet Take 1 tablet (20 mg total) by mouth in the morning and at bedtime.   sucralfate (CARAFATE) 1 g tablet TAKE 1 TABLET BY MOUTH 4 (FOUR) TIMES DAILY - WITH MEALS AND AT BEDTIME.   tamsulosin (FLOMAX) 0.4 MG CAPS capsule Take 0.4 mg by mouth daily.   testosterone cypionate (DEPOTESTOSTERONE CYPIONATE) 200 MG/ML injection Inject 200 mg into the muscle every 21 ( twenty-one) days.    Turmeric (QC TUMERIC COMPLEX PO) Take by mouth daily.   VICTOZA 18 MG/3ML SOPN INJECT 1.'8MG'$  SUBCUTANEOUSLYDAILY   No facility-administered encounter medications on file as of 11/30/2022.    Social History: Social History   Tobacco Use   Smoking status: Former    Packs/day: 2.00    Years: 20.00    Total pack years: 40.00    Types: Cigarettes    Quit date: 12/26/1989    Years since quitting: 32.9   Smokeless tobacco: Never  Vaping Use   Vaping Use: Never used  Substance Use Topics   Alcohol use: No   Drug use: No    Family Medical History: Family History  Problem Relation Age of Onset   Heart attack Brother     Physical Examination:  Today's Vitals   11/30/22 1332  BP: (!) 149/66   Pulse: 67  Weight: 124.9 kg  Height: '5\' 11"'$  (1.803 m)  PainSc: 6   PainLoc: Leg   Body mass index is 38.41 kg/m.   General: Patient is well developed, well nourished, calm, collected, and in no apparent distress. Attention to examination is appropriate.  Psychiatric: Patient is non-anxious.  Head:  Pupils equal, round, and reactive to light.  ENT:  Oral mucosa appears well hydrated.  Neck:   Supple.  Full range of motion.  Respiratory: Patient is breathing without any difficulty.  Extremities: No edema.  Vascular: Palpable dorsal pedal pulses.  Skin:   On exposed skin, there are no abnormal skin lesions.  NEUROLOGICAL:     Awake, alert, oriented to person, place, and time.  Speech is clear and fluent. Fund of knowledge is appropriate.   Cranial Nerves: Pupils equal round and reactive to light.  Facial tone is symmetric.  Facial sensation is symmetric.  ROM of spine: limited  Palpation of spine: non tender.    Strength: Side Biceps Triceps Deltoid Interossei Grip Wrist Ext. Wrist Flex.  R '5 5 5 5 5 5 5  '$ L '5 5 5 5 5 5 5   '$ Side Iliopsoas Quads Hamstring PF DF EHL  R '5 5 5 5 5 5  '$ L 5 5 4+ '5 5 5   '$ Reflexes are 1+ and symmetric at the biceps, triceps, brachioradialis, L patella and achilles.  Absent right patellar reflex. Hoffman's is absent.  Clonus is not present.  Toes are down-going.  Bilateral upper and lower extremity sensation is intact to light touch.    Ambulates with a slow antalgic gait Unable to tandem gait.    Straight leg raise on the left  Medical Decision Making  Imaging: MRI L spine 11/18/20  IMPRESSION: 1. L1-4 PLIF with unchanged moderate bilateral L3-4  neural foraminal stenosis. 2. Unchanged moderate right and severe left L5-S1 neural foraminal stenosis. 3. Unchanged appearance of the nerve roots adherent to the thecal sac at L5 and S1.     Electronically Signed   By: Ulyses Jarred M.D.   On: 11/18/2020 21:53  I have personally  reviewed the images and agree with the above interpretation.  Assessment and Plan: [name] is a pleasant 71 y.o. male with a longstanding history of lumbosacral complaints with a 2-year onset of left buttock and posterior thigh pain.  Given the findings on his MRI from 2021 it is possible that he has lumbar radiculopathy from this.  I like to obtain an updated MRI for further evaluation as well as CT scan to further evaluate his current construct.  I will contact him via telephone visit after completion of the studies and further plan of care.  We did discuss that he have to complete 6 weeks of physical therapy or be discharged from physical therapy before considering any surgical intervention.  He expressed understanding.  He was encouraged to call the office in the interim with any questions or concerns.  Thank you for involving me in the care of this patient.   I spent a total of 45 minutes in both face-to-face and non-face-to-face activities for this visit on the date of this encounter including you have outside records, review of imaging, discussion of symptoms, physical exam, discussion of treatment options, and documentation.  [facility]

## 2022-12-01 ENCOUNTER — Encounter: Payer: Medicare Other | Admitting: Family Medicine

## 2022-12-01 NOTE — Telephone Encounter (Signed)
Received another request from [place] for sildenafil (REVATIO) 20 MG tablet.  [facility]  Can we sent to correct pharmacy.  Please advise

## 2022-12-02 ENCOUNTER — Telehealth: Payer: Self-pay | Admitting: Cardiovascular Disease

## 2022-12-02 NOTE — Telephone Encounter (Signed)
   Pre-operative Risk Assessment    Patient Name: Jerry Parsons  DOB: 08-Mar-1951 MRN: [id number]      Request for Surgical Clearance    Procedure:  Lt Total Knee Arthroplasty  Date of Surgery:  Clearance 02/28/23                                 Surgeon:  Dr. Gaynelle Arabian Surgeon's Group or Practice Name:  [facility] Phone number:  [phone] Fax number:  [phone]   Type of Clearance Requested:   - Medical    Type of Anesthesia:  Not Indicated   Additional requests/questions:    Signed, Maxwell Caul   12/02/2022, 9:55 AM

## 2022-12-02 NOTE — Telephone Encounter (Signed)
   Name: Jerry Parsons  DOB: 03-23-1951  MRN: [id number]  Primary Cardiologist: Ida Rogue, MD   Preoperative team, please contact this patient and set up a phone call appointment on or after 12/30/2022 for further preoperative risk assessment. Please obtain consent and complete medication review. Thank you for your help.  I confirm that guidance regarding antiplatelet and oral anticoagulation therapy has been completed and, if necessary, noted below.   Per office protocol, patient can hold Eliquis for 3 days prior to procedure. He should resume as soon as safely possible after.    Lenna Sciara, NP 12/02/2022, 1:44 PM [place]

## 2022-12-02 NOTE — Telephone Encounter (Signed)
Called and left a voice message asking patient to call the office back ask for pre op callback to get scheduled for a telephone appointment. Will try calling again

## 2022-12-02 NOTE — Telephone Encounter (Signed)
Patient with diagnosis of afib on Eliquis for anticoagulation.    Procedure: left TKA Date of procedure: 02/28/23  CHA2DS2-VASc Score = 7  This indicates a 11.2% annual risk of stroke. The patient's score is based upon: CHF History: 1 HTN History: 1 Diabetes History: 1 Stroke History: 2 Vascular Disease History: 1 Age Score: 1 Gender Score: 0   Also with DVT and PE in 2011.  Per Dr Donivan Scull note, "Previous event where he had buckling of his knee, fall, contusion, TIA-type symptoms that took him to the hospital. He was kept overnight and most of his workup was relatively unrevealing including MRI/MRA, carotid ultrasound, echocardiogram."   CrCl 58m/min using adjusted body weight Platelet count 103K  Per office protocol, patient can hold Eliquis for 3 days prior to procedure. He should resume as soon as safely possible after.  **This guidance is not considered finalized until pre-operative APP has relayed final recommendations.**

## 2022-12-03 NOTE — Telephone Encounter (Signed)
I s/w the pt and his wife about tele pre op appt needed. I was asked if they could be seen in the office instead. I scheduled the pt to see DR. Gollan 01/31/23 at 2:20 pm at the [place] office. Pt and his thanked me the call and the help.

## 2022-12-06 MED ORDER — SILDENAFIL CITRATE 20 MG PO TABS: 20.0000 mg | ORAL_TABLET | Freq: Two times a day (BID) | ORAL | 0 refills | Status: DC

## 2022-12-06 NOTE — Addendum Note (Signed)
Addended by: Adolph Pollack on: 12/06/2022 12:50 PM   Modules accepted: Orders

## 2022-12-06 NOTE — Telephone Encounter (Signed)
We have received another refill request for this medication.  [facility]  Please sent to the correct pharmacy.

## 2022-12-06 NOTE — Telephone Encounter (Signed)
[facility]

## 2022-12-07 DIAGNOSIS — E611 Iron deficiency: Secondary | ICD-10-CM | POA: Diagnosis not present

## 2022-12-07 DIAGNOSIS — G2581 Restless legs syndrome: Secondary | ICD-10-CM | POA: Diagnosis not present

## 2022-12-09 ENCOUNTER — Ambulatory Visit
Admission: RE | Admit: 2022-12-09 | Discharge: 2022-12-09 | Disposition: A | Discharge status: Home / Self Care | Payer: Medicare Other | Source: Ambulatory Visit | Attending: Neurosurgery | Admitting: Neurosurgery

## 2022-12-09 ENCOUNTER — Telehealth: Payer: Self-pay | Admitting: Family Medicine

## 2022-12-09 DIAGNOSIS — M545 Low back pain, unspecified: Secondary | ICD-10-CM | POA: Diagnosis not present

## 2022-12-09 DIAGNOSIS — G8929 Other chronic pain: Secondary | ICD-10-CM

## 2022-12-09 DIAGNOSIS — M5442 Lumbago with sciatica, left side: Secondary | ICD-10-CM | POA: Insufficient documentation

## 2022-12-09 DIAGNOSIS — M47816 Spondylosis without myelopathy or radiculopathy, lumbar region: Secondary | ICD-10-CM | POA: Insufficient documentation

## 2022-12-09 DIAGNOSIS — E118 Type 2 diabetes mellitus with unspecified complications: Secondary | ICD-10-CM

## 2022-12-09 DIAGNOSIS — M48061 Spinal stenosis, lumbar region without neurogenic claudication: Secondary | ICD-10-CM

## 2022-12-09 MED ORDER — OXYCODONE HCL ER 40 MG PO T12A: 40.0000 mg | EXTENDED_RELEASE_TABLET | Freq: Three times a day (TID) | ORAL | 0 refills | Status: AC | PRN

## 2022-12-09 NOTE — Telephone Encounter (Signed)
   Notes to clinic: Duplicate request- non delegated Rx  Requested Prescriptions  Pending Prescriptions Disp Refills   oxyCODONE (OXYCONTIN) 40 mg 12 hr tablet 90 tablet 0    Sig: Take 1 tablet (40 mg total) by mouth every 8 (eight) hours as needed.     Not Delegated - Analgesics:  Opioid Agonists Failed - 12/09/2022  1:38 PM      Failed - This refill cannot be delegated      Failed - Urine Drug Screen completed in last 360 days      Passed - Valid encounter within last 3 months    Recent Outpatient Visits           3 weeks ago Olecranon bursitis of right elbow   [facility] Birdie Sons, MD   1 month ago Olecranon bursitis of right elbow   [facility] Birdie Sons, MD   3 months ago Muscle strain of left forearm, initial encounter   [facility] Jerrol Banana., MD   6 months ago HYPERTENSION, [place] Jerrol Banana., MD   10 months ago Tinea corporis   [place], Kirstie Peri, MD       Future Appointments             In 1 month Gollan, Kathlene November, MD [place]. [facility]               Requested Prescriptions  Pending Prescriptions Disp Refills   oxyCODONE (OXYCONTIN) 40 mg 12 hr tablet 90 tablet 0    Sig: Take 1 tablet (40 mg total) by mouth every 8 (eight) hours as needed.     Not Delegated - Analgesics:  Opioid Agonists Failed - 12/09/2022  1:38 PM      Failed - This refill cannot be delegated      Failed - Urine Drug Screen completed in last 360 days      Passed - Valid encounter within last 3 months    Recent Outpatient Visits           3 weeks ago Olecranon bursitis of right elbow   [facility] Birdie Sons, MD   1 month ago Olecranon bursitis of right elbow   [facility] Birdie Sons, MD   3 months ago Muscle strain of left forearm, initial encounter    [facility] Jerrol Banana., MD   6 months ago HYPERTENSION, [place] Jerrol Banana., MD   10 months ago Tinea corporis   [place], Kirstie Peri, MD       Future Appointments             In 1 month Gollan, Kathlene November, MD [place]. [place]

## 2022-12-09 NOTE — Telephone Encounter (Signed)
Requested medication (s) are due for refill today - yes  Requested medication (s) are on the active medication list -yes  Future visit scheduled -no  Last refill: 11/04/22 #90  Notes to clinic: non delegated Rx- requesting 3 month supply  Requested Prescriptions  Pending Prescriptions Disp Refills   oxyCODONE (OXYCONTIN) 40 mg 12 hr tablet 90 tablet 0    Sig: Take 1 tablet (40 mg total) by mouth every 8 (eight) hours as needed.     Not Delegated - Analgesics:  Opioid Agonists Failed - 12/09/2022  1:38 PM      Failed - This refill cannot be delegated      Failed - Urine Drug Screen completed in last 360 days      Passed - Valid encounter within last 3 months    Recent Outpatient Visits           3 weeks ago Olecranon bursitis of right elbow   [facility] Birdie Sons, MD   1 month ago Olecranon bursitis of right elbow   [facility] Birdie Sons, MD   3 months ago Muscle strain of left forearm, initial encounter   [facility] Jerrol Banana., MD   6 months ago HYPERTENSION, [place] Jerrol Banana., MD   10 months ago Tinea corporis   [place], Kirstie Peri, MD       Future Appointments             In 1 month Gollan, Kathlene November, MD [place]. [facility]               Requested Prescriptions  Pending Prescriptions Disp Refills   oxyCODONE (OXYCONTIN) 40 mg 12 hr tablet 90 tablet 0    Sig: Take 1 tablet (40 mg total) by mouth every 8 (eight) hours as needed.     Not Delegated - Analgesics:  Opioid Agonists Failed - 12/09/2022  1:38 PM      Failed - This refill cannot be delegated      Failed - Urine Drug Screen completed in last 360 days      Passed - Valid encounter within last 3 months    Recent Outpatient Visits           3 weeks ago Olecranon bursitis of right elbow   [facility]  Birdie Sons, MD   1 month ago Olecranon bursitis of right elbow   [facility] Birdie Sons, MD   3 months ago Muscle strain of left forearm, initial encounter   [facility] Jerrol Banana., MD   6 months ago HYPERTENSION, [place] Jerrol Banana., MD   10 months ago Tinea corporis   [place], Kirstie Peri, MD       Future Appointments             In 1 month Gollan, Kathlene November, MD [place]. [place]

## 2022-12-09 NOTE — Telephone Encounter (Addendum)
Medication Refill - Medication: FREESTYLE LIBRE 2 SENSOR KIT , oxyCODONE (OXYCONTIN) 40 mg 12 hr tablet   Requesting a 58-monthsupply. Pt wife stated PA may be needed last PA expired 11/28/2022 .   Has the patient contacted their pharmacy? Yes.    (Agent: If yes, when and what did the pharmacy advise?)  Preferred Pharmacy (with phone number or street name):  Please send FREESTYLE LIBRE 2 SENSOR KIT to pharmacy below.  [id number] [place] [id number]: [phone]: [phone]: Not open 24 hours    Please send oxyCODONE (OXYCONTIN) 40 mg 12 hr tablet to pharmacy below.   [facility] [id number]: [phone]: [phone]: Not open 24 hours   Has the patient been seen for an appointment in the last year OR does the patient have an upcoming appointment? Yes.   Declined to make an appointment. Stated has been seen recently.  Agent: Please be advised that RX refills may take up to 3 business days. We ask that you follow-up with your pharmacy.

## 2022-12-12 ENCOUNTER — Other Ambulatory Visit: Payer: Self-pay | Admitting: Cardiovascular Disease

## 2022-12-12 DIAGNOSIS — I480 Paroxysmal atrial fibrillation: Secondary | ICD-10-CM

## 2022-12-13 ENCOUNTER — Telehealth: Payer: Self-pay | Admitting: Family Medicine

## 2022-12-13 DIAGNOSIS — E118 Type 2 diabetes mellitus with unspecified complications: Secondary | ICD-10-CM

## 2022-12-13 NOTE — Telephone Encounter (Signed)
Prescription refill request for Eliquis received. Indication: A Fib/flutter Last office visit:  05/14/22  Johnny Bridge MD Scr: 1.43 on 06/08/22 Age: 71 Weight: 119.5kg  Based on above findings Eliquis '5mg'$  twice daily is the appropriate dose.  Refill approved.

## 2022-12-13 NOTE — Telephone Encounter (Signed)
Patient called and advised he will need to schedule with Dr. Quentin Cornwall to establish care. He says he's been in the office twice to see Dr. Caryn Section. I asked is Dr. Caryn Section his provider, he said no, he will be seeing Dr. [name] at the new office in January and all he needs is the [place] sensor to be dispensed as 2 not 1, the Rx has run out. I advised since he did see Dr. Caryn Section, I will send this to him for approval and if he requires an OV, someone will call. I advised he is still a [facility] patient until he's seen in the new office, so an appointment may be required, he verbalized understanding and says to call and let him know if it can't be refilled.

## 2022-12-13 NOTE — Telephone Encounter (Signed)
Wife Rise Paganini states this Rx oxyCODONE (OXYCONTIN) 40 mg 12 hr tablet  Needs a prior authorization. In addition, she states this needs to be a 3 mo supply, not a 30 day Rise Paganini concerned [employer] will close for the holidays. Please advise

## 2022-12-13 NOTE — Telephone Encounter (Signed)
Prescription refill request for Eliquis received. Indication:afib Last office visit:5/23 Scr:1.4 Age: 71 Weight:124.9  kg  Prescription refilled

## 2022-12-13 NOTE — Telephone Encounter (Signed)
Medication Refill - Medication: Continuous Blood Gluc Sensor (FREESTYLE LIBRE 2 SENSOR) MISC   Has the patient contacted their pharmacy? Yes.   The pharmacist states pt has been getting 2 a month b/c one sensor is only good for 14 days.  Pt needs new Rx w/ 2 sensors a month, not one.  Preferred Pharmacy (with phone number or street name): [id number] patient been seen for an appointment in the last year OR does the patient have an upcoming appointment? Yes.    Agent: Please be advised that RX refills may take up to 3 business days. We ask that you follow-up with your pharmacy.

## 2022-12-13 NOTE — Telephone Encounter (Signed)
Refill Request.  

## 2022-12-14 ENCOUNTER — Ambulatory Visit (INDEPENDENT_AMBULATORY_CARE_PROVIDER_SITE_OTHER): Payer: Medicare Other | Admitting: Neurosurgery

## 2022-12-14 DIAGNOSIS — G8929 Other chronic pain: Secondary | ICD-10-CM | POA: Diagnosis not present

## 2022-12-14 DIAGNOSIS — S32009K Unspecified fracture of unspecified lumbar vertebra, subsequent encounter for fracture with nonunion: Secondary | ICD-10-CM

## 2022-12-14 DIAGNOSIS — M5442 Lumbago with sciatica, left side: Secondary | ICD-10-CM

## 2022-12-14 NOTE — Progress Notes (Signed)
Neurosurgery Telephone (Audio-Only) Note  Requesting Provider     Eulis Foster, [place],  [place] [address] T: [phone] F: [phone]  Primary Care Provider Eulis Foster, [place] [address] T: [phone] F: [phone]  Telehealth visit was conducted with Jerry Parsons, a 71 y.o. male via telephone.  Patient is aware of the limitations of this type of visit.   History of Present Illness: Jerry Parsons is a 71 y.o presenting today via telephone visit to review imaging studies. He continues to have back and left leg pain unchanged.    11/30/22 Jerry Parsons is a 71 y.o with a history of  GERD, DVT, DM ( last A1c 7.2), HTN, CKD (Cr 1.43), previous spinal surgery x 9, who is here today with a chief complaint of chronic lumbosacral complaints with a new onset of left buttock and posterior thigh pain.  He states that this started about 2 years ago without any particular inciting event.  His last surgery was in 2015 for low back pain which should provide him with some improvement however he now has radiating left leg pain that is new.  He states that it feels as though his hip and buttock are getting tight like a rubber band and this stops him from being able to ambulate prolonged distances.  This is significantly affecting his ability to walk and his quality of life.  He has not undergone any recent physical therapy or epidural steroid injections however these were not helpful in the past.  In addition of this he describes weakness in his bilateral lower extremities for the last year or so.  His most pressing complaint is his left buttock and hip pain.   Conservative measures:  Physical therapy: None recently Multimodal medical therapy including regular antiinflammatories:   gabapentin, Lyrica, and OxyContin Injections: no recent epidural steroid injections   Past Surgery: Multiple  previous spinal surgeries.    [name] has no symptoms of cervical myelopathy.   The symptoms are causing a significant impact on the patient's life.    Prior to Admission medications   Medication Sig Start Date End Date Taking? Authorizing Provider  amiodarone (PACERONE) 200 MG tablet TAKE 1 TABLET BY MOUTH EVERY DAY 08/16/22   Gollan, Kathlene November, MD  Apoaequorin (PREVAGEN) 10 MG CAPS Take 10 mg by mouth daily.    [provider]  aspirin EC 81 MG tablet Take 81 mg by mouth daily. Swallow whole.    [provider]  Continuous Blood Gluc Sensor (FREESTYLE LIBRE 2 SENSOR) MISC USE TO CHECK BLOOD SUGAR AS DIRECTED 05/18/22   Jerrol Banana., MD  cyclobenzaprine (FLEXERIL) 10 MG tablet TAKE 1 TABLET BY MOUTH THREE TIMES A DAY 09/07/22   Jerrol Banana., MD  dutasteride (AVODART) 0.5 MG capsule TAKE 1 CAPSULE DAILY 09/10/22   Jerrol Banana., MD  ELIQUIS 5 MG TABS tablet TAKE 1 TABLET TWICE A DAY 12/13/22   Minna Merritts, MD  esomeprazole (NEXIUM) 40 MG capsule TAKE 1 CAPSULE TWICE DAILY 07/26/22   Jerrol Banana., MD  FARXIGA 10 MG TABS tablet TAKE 1 TABLET BY MOUTH EVERY DAY 10/04/22   Ostwalt, Letitia Libra, PA-C  ferrous sulfate 325 (65 FE) MG tablet Take 1 tablet by mouth daily. 08/12/21   [provider]  fexofenadine (ALLEGRA) 180 MG tablet Take 180 mg by mouth daily.    [provider]  furosemide (  LASIX) 40 MG tablet TAKE 1 TABLET BY MOUTH EVERY DAY 09/07/22   Minna Merritts, MD  gabapentin (NEURONTIN) 100 MG capsule Take 1 capsule (100 mg total) by mouth 2 (two) times daily. 11/12/22   Birdie Sons, MD  gabapentin (NEURONTIN) 300 MG capsule Take 1 capsule (300 mg total) by mouth at bedtime. 11/12/22   Birdie Sons, MD  Insulin Glargine [facility]) 100 UNIT/ML INJECT 40 UNITS            SUBCUTANEOUSLY TWO TIMES A DAY 10/07/22   Simmons-Robinson, Riki Sheer, MD  JANUVIA 100 MG tablet TAKE 1 TABLET DAILY 09/10/22    Jerrol Banana., MD  magnesium oxide (MAG-OX) 400 MG tablet TAKE 1 TABLET BY MOUTH TWICE A DAY 07/26/22   Jerrol Banana., MD  metoprolol tartrate (LOPRESSOR) 100 MG tablet TAKE 1 TABLET BY MOUTH TWICE A DAY 07/12/22   Jerrol Banana., MD  NOVOLOG FLEXPEN 100 UNIT/ML FlexPen INJECT 14 UNITS            SUBCUTANEOUSLY IN THE      MORNING AND AT BEDTIME 04/26/22   Jerrol Banana., MD  Omega-3 Fatty Acids (FISH OIL) 1200 MG CAPS Take 1,200 mg by mouth 2 (two) times daily.     [provider]  [facility] ULTRA test strip USE WITH METER TWICE DAILY 09/13/22   Jerrol Banana., MD  oxyCODONE (OXYCONTIN) 40 mg 12 hr tablet Take 1 tablet (40 mg total) by mouth every 8 (eight) hours as needed. 12/09/22   Simmons-Robinson, Riki Sheer, MD  pregabalin (LYRICA) 200 MG capsule TAKE 1 CAPSULE 3 TIMES A   DAY 09/10/22   Jerrol Banana., MD  rOPINIRole (REQUIP) 1 MG tablet TAKE 1 TO 2 TABLETS BY MOUTH AT BEDTIME AS NEEDED Patient taking differently: Take 1-2 mg by mouth 2 (two) times daily. 07/21/22   Jerrol Banana., MD  rOPINIRole (REQUIP) 2 MG tablet TAKE 1 TABLET BY MOUTH TWICE A DAY AS NEEDED Patient taking differently: Take 2 mg by mouth at bedtime. 09/07/22   Jerrol Banana., MD  sildenafil (REVATIO) 20 MG tablet Take 1 tablet (20 mg total) by mouth in the morning and at bedtime. 12/06/22   Simmons-Robinson, Makiera, MD  sucralfate (CARAFATE) 1 g tablet TAKE 1 TABLET BY MOUTH 4 (FOUR) TIMES DAILY - WITH MEALS AND AT BEDTIME. 08/16/22   Jerrol Banana., MD  tamsulosin (FLOMAX) 0.4 MG CAPS capsule Take 0.4 mg by mouth daily. 10/12/20   [provider]  testosterone cypionate (DEPOTESTOSTERONE CYPIONATE) 200 MG/ML injection Inject 200 mg into the muscle every 21 ( twenty-one) days.  05/02/18   [provider]  VICTOZA 18 MG/3ML SOPN INJECT 1.'8MG'$  SUBCUTANEOUSLYDAILY 04/26/22   Jerrol Banana., MD   Imaging Studies  MRI L spine  12/09/22 IMPRESSION: 1. No acute findings or explanation for the patient's symptoms. 2. Previous fusion from L1 through S1. The interbody fusion is solid at all levels with the exception of L3-4 where there is only minimal endplate edema. 3. No significant spinal stenosis or nerve root encroachment. Left foraminal narrowing appears greatest at L5-S1 and appears chronic.     Electronically Signed   By: Richardean Sale M.D.   On: 12/10/2022 17:33  CT L spine 12/09/22 IMPRESSION: 1. Stable appearance of the lumbar spine compared with previous CT from 11/09/2017. 2. Chronic loosening of the bilateral L4 pedicle screws and probably the left  L3 pedicle screw which extends into the L2-3 disc. 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 3. Solid interbody fusion and intact hardware at L1-2, L2-3, L4-5 and L5-S1. 4. No acute osseous findings or significant spinal stenosis. Chronic left greater than right foraminal narrowing at L5-S1. 5.  Aortic Atherosclerosis (ICD10-I70.0). 6. See separate lumbar MRI report.     Electronically Signed   By: Richardean Sale M.D.  IMPRESSION  Jerry Parsons is a 71 y.o. male who I performed a telephone encounter today for evaluation and management of chronic lumbosacral complaints and a 2 year onset of left buttock and thigh pain  PLAN  Plan for 6 weeks of PT. Pt will think about doing ESIs and get back to [place]. I will follow up after conservative treatment to decide if he needs to be scheduled with Dr. Izora Ribas to discuss if surgical options are available.  No orders of the defined types were placed in this encounter.  DISPOSITION  Follow up: In person appointment in  as above   Loleta Dicker, [place]   This visit was performed via telephone.  Patient location: home Provider location: office  I spent a total of 5 minutes non-face-to-face activities for this visit on the date of this encounter including  review of current clinical condition and response to treatment.

## 2022-12-14 NOTE — Telephone Encounter (Signed)
PA initiated

## 2022-12-21 MED ORDER — FREESTYLE LIBRE 2 SENSOR MISC: 2 refills | Status: DC

## 2022-12-21 NOTE — Telephone Encounter (Addendum)
Patients wife Rise Paganini is calling back to check on the status of the prior authorization for his medication oxyCODONE (OXYCONTIN) 40 mg 12 hr tablet .  Called office and connected [place] to [place].  Please note the update of the PA.

## 2022-12-21 NOTE — Telephone Encounter (Signed)
PA approved.

## 2022-12-21 NOTE — Telephone Encounter (Signed)
PA for Oxy done. Rise Paganini patient's wife advised

## 2022-12-21 NOTE — Telephone Encounter (Signed)
Patients wife Rise Paganini is calling back to check on the Freestyle 2 Libre sensor kit to monitor his blood sugar.  Please advise

## 2022-12-24 NOTE — Progress Notes (Unsigned)
Established patient visit   Patient: Jerry Parsons   DOB: 12-22-1951   71 y.o. Male  MRN: [id number] Visit Date: 12/29/2022  Today's healthcare provider: Eulis Foster, MD   No chief complaint on file.  Subjective    HPI  Diabetes Mellitus Type II, Follow-up  Lab Results  Component Value Date   HGBA1C 7.2 (H) 05/14/2022   HGBA1C 7.3 (A) 09/02/2021   HGBA1C 7.6 (A) 05/04/2021   Wt Readings from Last 3 Encounters:  11/30/22 275 lb 6.4 oz (124.9 kg)  11/12/22 275 lb (124.7 kg)  11/03/22 270 lb (122.5 kg)   Last seen for diabetes 4 months ago.  Management since then includes no changes. He reports {excellent/good/fair/poor:19665} compliance with treatment. He {is/is not:21021397} having side effects. {document side effects if present:1}  Home blood sugar records: {diabetes glucometry results:16657}  Episodes of hypoglycemia? {Yes/No:20286} {enter symptoms and frequency of symptoms if yes:1}   Current insulin regiment: basaglar 40 units BID Most Recent Eye Exam: *** {Current exercise:16438:::1} {Current diet habits:16563:::1}  Pertinent Labs: Lab Results  Component Value Date   CHOL 143 06/08/2022   HDL 23 (L) 06/08/2022   LDLCALC 73 06/08/2022   TRIG 292 (H) 06/08/2022   CHOLHDL 6.2 (H) 06/08/2022   Lab Results  Component Value Date   NA 146 (H) 06/08/2022   K 4.6 06/08/2022   CREATININE 1.43 (H) 06/08/2022   EGFR 52 (L) 06/08/2022   MICROALBUR 50 09/18/2020     ---------------------------------------------------------------------------------------------------  Medications: Outpatient Medications Prior to Visit  Medication Sig  . amiodarone (PACERONE) 200 MG tablet TAKE 1 TABLET BY MOUTH EVERY DAY  . Apoaequorin (PREVAGEN) 10 MG CAPS Take 10 mg by mouth daily.  [name] aspirin EC 81 MG tablet Take 81 mg by mouth daily. Swallow whole.  . Continuous Blood Gluc Sensor (FREESTYLE LIBRE 2 SENSOR) MISC USE TO CHECK BLOOD SUGAR AS DIRECTED  .  cyclobenzaprine (FLEXERIL) 10 MG tablet TAKE 1 TABLET BY MOUTH THREE TIMES A DAY  . dutasteride (AVODART) 0.5 MG capsule TAKE 1 CAPSULE DAILY  . ELIQUIS 5 MG TABS tablet TAKE 1 TABLET TWICE A DAY  . esomeprazole (NEXIUM) 40 MG capsule TAKE 1 CAPSULE TWICE DAILY  . FARXIGA 10 MG TABS tablet TAKE 1 TABLET BY MOUTH EVERY DAY  . ferrous sulfate 325 (65 FE) MG tablet Take 1 tablet by mouth daily.  . fexofenadine (ALLEGRA) 180 MG tablet Take 180 mg by mouth daily.  . furosemide (LASIX) 40 MG tablet TAKE 1 TABLET BY MOUTH EVERY DAY  . gabapentin (NEURONTIN) 100 MG capsule Take 1 capsule (100 mg total) by mouth 2 (two) times daily.  [name] gabapentin (NEURONTIN) 300 MG capsule Take 1 capsule (300 mg total) by mouth at bedtime.  . Insulin Glargine (BASAGLAR KWIKPEN) 100 UNIT/ML INJECT 40 UNITS            SUBCUTANEOUSLY TWO TIMES A DAY  . JANUVIA 100 MG tablet TAKE 1 TABLET DAILY  . magnesium oxide (MAG-OX) 400 MG tablet TAKE 1 TABLET BY MOUTH TWICE A DAY  . metoprolol tartrate (LOPRESSOR) 100 MG tablet TAKE 1 TABLET BY MOUTH TWICE A DAY  . NOVOLOG FLEXPEN 100 UNIT/ML FlexPen INJECT 14 UNITS            SUBCUTANEOUSLY IN THE      MORNING AND AT BEDTIME  . Omega-3 Fatty Acids (FISH OIL) 1200 MG CAPS Take 1,200 mg by mouth 2 (two) times daily.   Glory Rosebush ULTRA test  strip USE WITH METER TWICE DAILY  . oxyCODONE (OXYCONTIN) 40 mg 12 hr tablet Take 1 tablet (40 mg total) by mouth every 8 (eight) hours as needed.  . pregabalin (LYRICA) 200 MG capsule TAKE 1 CAPSULE 3 TIMES A   DAY  . rOPINIRole (REQUIP) 1 MG tablet TAKE 1 TO 2 TABLETS BY MOUTH AT BEDTIME AS NEEDED (Patient taking differently: Take 1-2 mg by mouth 2 (two) times daily.)  . rOPINIRole (REQUIP) 2 MG tablet TAKE 1 TABLET BY MOUTH TWICE A DAY AS NEEDED (Patient taking differently: Take 2 mg by mouth at bedtime.)  . sildenafil (REVATIO) 20 MG tablet Take 1 tablet (20 mg total) by mouth in the morning and at bedtime.  . sucralfate (CARAFATE) 1 g tablet  TAKE 1 TABLET BY MOUTH 4 (FOUR) TIMES DAILY - WITH MEALS AND AT BEDTIME.  . tamsulosin (FLOMAX) 0.4 MG CAPS capsule Take 0.4 mg by mouth daily.  [name] testosterone cypionate (DEPOTESTOSTERONE CYPIONATE) 200 MG/ML injection Inject 200 mg into the muscle every 21 ( twenty-one) days.   [name] VICTOZA 18 MG/3ML SOPN INJECT 1.8MG SUBCUTANEOUSLYDAILY   No facility-administered medications prior to visit.    Review of Systems  Constitutional:  Negative for appetite change and fatigue.  Eyes:  Negative for visual disturbance.  Respiratory:  Negative for chest tightness and shortness of breath.   Cardiovascular:  Negative for chest pain and leg swelling.  Gastrointestinal:  Negative for abdominal pain, nausea and vomiting.  Neurological:  Negative for dizziness, light-headedness and headaches.   {Labs  Heme  Chem  Endocrine  Serology  Results Review (optional):23779}   Objective    There were no vitals taken for this visit. BP Readings from Last 3 Encounters:  11/30/22 (!) 149/66  11/12/22 (!) 126/49  11/03/22 (!) 144/65   Wt Readings from Last 3 Encounters:  11/30/22 275 lb 6.4 oz (124.9 kg)  11/12/22 275 lb (124.7 kg)  11/03/22 270 lb (122.5 kg)      Physical Exam  ***  No results found for any visits on 12/29/22.  Assessment & Plan     ***  No follow-ups on file.      {provider attestation***:1}   Eulis Foster, MD  [facility] [phone] (phone) [phone] (fax)  [place]

## 2022-12-28 DIAGNOSIS — Z794 Long term (current) use of insulin: Secondary | ICD-10-CM | POA: Insufficient documentation

## 2022-12-29 ENCOUNTER — Ambulatory Visit (INDEPENDENT_AMBULATORY_CARE_PROVIDER_SITE_OTHER): Payer: Medicare Other | Admitting: Family Medicine

## 2022-12-29 ENCOUNTER — Encounter: Payer: Self-pay | Admitting: Family Medicine

## 2022-12-29 VITALS — BP 149/58 | HR 60 | Temp 98.9°F | Resp 16 | Wt 269.2 lb

## 2022-12-29 DIAGNOSIS — M5441 Lumbago with sciatica, right side: Secondary | ICD-10-CM | POA: Diagnosis not present

## 2022-12-29 DIAGNOSIS — E1142 Type 2 diabetes mellitus with diabetic polyneuropathy: Secondary | ICD-10-CM

## 2022-12-29 DIAGNOSIS — Z794 Long term (current) use of insulin: Secondary | ICD-10-CM | POA: Diagnosis not present

## 2022-12-29 DIAGNOSIS — G8929 Other chronic pain: Secondary | ICD-10-CM

## 2022-12-29 DIAGNOSIS — M5442 Lumbago with sciatica, left side: Secondary | ICD-10-CM

## 2022-12-29 DIAGNOSIS — M7021 Olecranon bursitis, right elbow: Secondary | ICD-10-CM | POA: Insufficient documentation

## 2022-12-29 MED ORDER — CIPROFLOXACIN HCL 500 MG PO TABS: 500.0000 mg | ORAL_TABLET | Freq: Two times a day (BID) | ORAL | 0 refills | Status: AC

## 2022-12-29 MED ORDER — NALOXONE HCL 4 MG/0.1ML NA LIQD: NASAL | 1 refills | Status: AC

## 2022-12-29 NOTE — Assessment & Plan Note (Signed)
Patient continues to have tenderness and swelling and redness of the right olecranon Reviewed sensitivities of fluid draining from joint at previous visit Prescribed ciprofloxacin 500 for 10-day course Patient and his spouse were agreeable with this plan

## 2022-12-29 NOTE — Patient Instructions (Addendum)
I have prescribed Ciprofloxacin for the next 10 days for the elbow.  We will collect urine studies to measure the protein in your urine as well as for the urine drug screening required for chronic narcotic prescriptions.   I have prescribed narcan to be used in case of an emergency.   Please follow up in the next three months for medication management.    Dr. Quentin Cornwall

## 2022-12-29 NOTE — Assessment & Plan Note (Signed)
Patient requested 90-day supply of OxyContin 40 mg every 8 hours He and his wife states that they have usually received a 90-day supply of this medication Patient was agreeable to opioid agreement contract Patient was agreeable to urine drug screen Patient declines refills for medications today due to cost

## 2022-12-29 NOTE — Assessment & Plan Note (Signed)
Diabetes foot exam completed today, patient to continue following with podiatry Hemoglobin A1c repeated today Urine microalbumin collected today  Patient will continue current medications

## 2022-12-30 LAB — PMP SCREEN PROFILE (10S), URINE

## 2022-12-30 LAB — HEMOGLOBIN A1C

## 2023-01-01 LAB — PMP SCREEN PROFILE (10S), URINE

## 2023-01-04 LAB — MICROALBUMIN / CREATININE URINE RATIO

## 2023-01-04 LAB — PMP SCREEN PROFILE (10S), URINE

## 2023-01-04 LAB — HEMOGLOBIN A1C

## 2023-01-06 ENCOUNTER — Other Ambulatory Visit: Payer: Self-pay | Admitting: Family Medicine

## 2023-01-06 DIAGNOSIS — E118 Type 2 diabetes mellitus with unspecified complications: Secondary | ICD-10-CM

## 2023-01-06 NOTE — Telephone Encounter (Signed)
Requested medication (s) are due for refill today - no  Requested medication (s) are on the active medication list -yes  Future visit scheduled -no  Last refill: 12/21/22  Notes to clinic: Pharmacy request: Please send new Rx- Part B requires monthly RF  Requested Prescriptions  Pending Prescriptions Disp Refills   Continuous Blood Gluc Sensor (FREESTYLE LIBRE 2 SENSOR) [place] [Pharmacy Med Name: FREESTYLE LIBRE 2 SENSOR]  2    Sig: USE TO [place] AS DIRECTED     Endocrinology: Diabetes - Testing Supplies Passed - 01/06/2023  3:26 PM      Passed - Valid encounter within last 12 months    Recent Outpatient Visits           1 week ago Type 2 diabetes mellitus with diabetic polyneuropathy, with long-term current use of insulin ([place])   Hodg[place], [place], MD   1 month ago Olecranon bursitis of right elbow   [facility] Birdie Sons, MD   2 months ago Olecranon bursitis of right elbow   [facility] Birdie Sons, MD   4 months ago Muscle strain of left forearm, initial encounter   [facility] Jerrol Banana., MD   7 months ago HYPERTENSION, [place] Jerrol Banana., MD       Future Appointments             In 3 weeks Gollan, Kathlene November, MD [place]. [facility]               Requested Prescriptions  Pending Prescriptions Disp Refills   Continuous Blood Gluc Sensor (FREESTYLE LIBRE 2 SENSOR) [place] [Pharmacy Med Name: FREESTYLE LIBRE 2 SENSOR]  2    Sig: USE TO [place] BLOOD SUGAR AS DIRECTED     Endocrinology: Diabetes - Testing Supplies Passed - 01/06/2023  3:26 PM      Passed - Valid encounter within last 12 months    Recent Outpatient Visits           1 week ago Type 2 diabetes mellitus with diabetic polyneuropathy, with long-term current use of insulin ([place])   [place], [place], MD   1 month ago Olecranon bursitis of right elbow   [facility] Birdie Sons, MD   2 months ago Olecranon bursitis of right elbow   [facility] Birdie Sons, MD   4 months ago Muscle strain of left forearm, initial encounter   [facility] Jerrol Banana., MD   7 months ago HYPERTENSION, [place] Jerrol Banana., MD       Future Appointments             In 3 weeks Gollan, Kathlene November, MD [place]. [place]

## 2023-01-09 ENCOUNTER — Other Ambulatory Visit: Payer: Self-pay | Admitting: Family Medicine

## 2023-01-09 DIAGNOSIS — E118 Type 2 diabetes mellitus with unspecified complications: Secondary | ICD-10-CM

## 2023-01-10 ENCOUNTER — Other Ambulatory Visit: Payer: Self-pay | Admitting: Family Medicine

## 2023-01-10 MED ORDER — FREESTYLE LIBRE 2 READER DEVI: 1.0000 | Freq: Every day | 3 refills | Status: DC

## 2023-01-12 ENCOUNTER — Telehealth: Payer: Self-pay

## 2023-01-12 ENCOUNTER — Other Ambulatory Visit: Payer: Self-pay | Admitting: Family Medicine

## 2023-01-12 DIAGNOSIS — E118 Type 2 diabetes mellitus with unspecified complications: Secondary | ICD-10-CM

## 2023-01-12 MED ORDER — FREESTYLE LIBRE 2 READER DEVI: 1.0000 | Freq: Every day | 3 refills | Status: DC

## 2023-01-12 MED ORDER — FREESTYLE LIBRE 2 SENSOR MISC: 2 refills | Status: DC

## 2023-01-12 NOTE — Telephone Encounter (Signed)
Copied from [place] [phone]. Topic: General - Other >> Jan 12, 2023 11:23 AM Cyndi Bender wrote: Reason for CRM: Elta Guadeloupe with [facility] requests Rx for Glucose reader that includes the instructions and diagnosis code.  Per Elta Guadeloupe the insurance companies have strict guidelines so the Rx for the reader has to be first and it will not be approved without the instructions and diagnosis code. Cb# [phone]

## 2023-01-13 MED ORDER — FREESTYLE LIBRE 2 READER DEVI: 1.0000 | Freq: Every day | 3 refills | Status: DC

## 2023-01-13 MED ORDER — FREESTYLE LIBRE 2 SENSOR MISC: 2.0000 | Freq: Every day | 0 refills | Status: DC

## 2023-01-13 NOTE — Telephone Encounter (Signed)
Prescriptions submitted for sensors and reader with diagnosis code DGX: E11.42, Z79.4  Eulis Foster, MD  [facility]       .

## 2023-01-14 NOTE — Telephone Encounter (Signed)
Pharmacy advised. Verbalized understanding

## 2023-01-17 ENCOUNTER — Encounter: Payer: Self-pay | Admitting: Family Medicine

## 2023-01-25 DIAGNOSIS — H43812 Vitreous degeneration, left eye: Secondary | ICD-10-CM | POA: Diagnosis not present

## 2023-01-25 DIAGNOSIS — E119 Type 2 diabetes mellitus without complications: Secondary | ICD-10-CM | POA: Diagnosis not present

## 2023-01-25 DIAGNOSIS — H2589 Other age-related cataract: Secondary | ICD-10-CM | POA: Diagnosis not present

## 2023-01-25 LAB — HM DIABETES EYE EXAM

## 2023-01-27 DIAGNOSIS — G4733 Obstructive sleep apnea (adult) (pediatric): Secondary | ICD-10-CM | POA: Diagnosis not present

## 2023-01-27 DIAGNOSIS — Z6836 Body mass index (BMI) 36.0-36.9, adult: Secondary | ICD-10-CM | POA: Diagnosis not present

## 2023-01-27 DIAGNOSIS — E1143 Type 2 diabetes mellitus with diabetic autonomic (poly)neuropathy: Secondary | ICD-10-CM | POA: Diagnosis not present

## 2023-01-27 DIAGNOSIS — M48062 Spinal stenosis, lumbar region with neurogenic claudication: Secondary | ICD-10-CM | POA: Diagnosis not present

## 2023-01-27 DIAGNOSIS — I700 Atherosclerosis of aorta: Secondary | ICD-10-CM | POA: Diagnosis not present

## 2023-01-27 DIAGNOSIS — I483 Typical atrial flutter: Secondary | ICD-10-CM | POA: Diagnosis not present

## 2023-01-27 DIAGNOSIS — Z87891 Personal history of nicotine dependence: Secondary | ICD-10-CM | POA: Diagnosis not present

## 2023-01-27 DIAGNOSIS — I5032 Chronic diastolic (congestive) heart failure: Secondary | ICD-10-CM | POA: Diagnosis not present

## 2023-01-27 DIAGNOSIS — E892 Postprocedural hypoparathyroidism: Secondary | ICD-10-CM | POA: Diagnosis not present

## 2023-01-27 DIAGNOSIS — I110 Hypertensive heart disease with heart failure: Secondary | ICD-10-CM | POA: Diagnosis not present

## 2023-01-27 DIAGNOSIS — I10 Essential (primary) hypertension: Secondary | ICD-10-CM | POA: Diagnosis not present

## 2023-01-30 NOTE — Progress Notes (Unsigned)
Cardiology Office Note  Date:  01/31/2023   ID:  Townsend, Cudworth July 07, 1951, MRN [id number]  PCP:  Eulas Post, MD   Chief Complaint  Patient presents with   Follow-up    Patient was going to have left total knee arthroplasty but has been canceled due to difficulties with his back. Patient c/o shortness of breath with over exertion. Medications reviewed by the patient verbally.     HPI:  Mr. Daino is a  72 year old gentleman, multiple back surgeries,  spinal stenosis , s/p arthroscopic knee surgery, total knee replacement left obesity,  diabetes ,  DVT, PE in March 2011 previously on warfarin,  30 year smoking history,   neuropathy hypertension,  depression,  renal disease,  obstructive sleep apnea on CPAP,  chronic sweating at nighttime which he attributes to the Vicodin. Prior severe pneumonia, ATN Remote atrial flutter dating back to 2014, fibrillation, last episode in 2015.  Flutter with cardioversion 08/2020 presenting for routine followup of his hypertension , atrial flutter  Last seen by myself in clinic May 2023 In follow-up today reports having worsening back pain Feels that he needs back surgery MRI December 2023 of back with no acute changes to explain his symptoms Reports he is willing to try PT and cortisones  Lab work reviewed A1C 7.2 to 8.0 Normal BMP  Recently missed Lasix for 2 days secondary to medical appointments, appreciated some abdominal fullness Back on his Lasix 40 daily SOB once in a while, stable Denies chest pain on exertion concerning for angina  Unable to sit with legs up secondary to chronic back pain Feels his leg swelling is stable  Labs from 6 months ago A1c 7.3, creatinine 1.28 potassium 4.1  EKG personally reviewed by myself on todays visit Normal sinus rhythm rate 68 bpm no significant ST-T wave changes  Other past medical history reviewed  Echocardiogram June 2021  mild LVH otherwise normal LV size and  function   total cholesterol 134 LDL 67   hospitalization June 05, 2020  atrial flutter Underwent cardioversion, 09/11/2020 NSR restored  Chronic back pain 9 back surgeries, previously thought about  spinal cord stimulator  Admission 5/18 for sepsis, salmonella diarrhea, per the discharge summary From eggs, per the patient He did not receive antibiotics  [place] He was in ICU for 3 days, long recovery Lost more than 30 pounds,  Lipid panel done while he was very ill early 2017, total cholesterol at that time 89  Denies any atrial fibrillation through his hospital course, reports he was changed to amiodarone IV infusion and then back to pill when he was tolerating oral medications  Previous event where he had buckling of his knee, fall, contusion, TIA-type symptoms that took him to the hospital. He was kept overnight and most of his workup was relatively unrevealing including MRI/MRA, carotid ultrasound, echocardiogram.   History of atrial flutter, status post cardioversion on 03/14/2013.   2 admissions to the hospital for gastroenteritis/ campylobacter infection. Required a long course of antibiotics. Possible  infection from tainted chicken.   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  PSH:  Past Surgical History:  Procedure Laterality Date   BACK SURGERY     x 8; upper x 3 & lower x 5   CARDIOVERSION  03/14/13, 10/16   2014 - [place], 2016 - [place]    CARDIOVERSION N/A 09/11/2020   Procedure: CARDIOVERSION;  Surgeon: Minna Merritts, MD;  Location: [facility];  Service: Cardiovascular;  Laterality: N/A;   CATARACT EXTRACTION W/PHACO Left 10/29/2015   Procedure: CATARACT EXTRACTION PHACO AND INTRAOCULAR LENS PLACEMENT ([place]);  Surgeon: Leandrew Koyanagi, MD;  Location: [place];  Service: Ophthalmology;  Laterality: Left;  DIABETIC - insulin and oral medsSleep apnea - no machine   CHOLECYSTECTOMY     COLONOSCOPY WITH PROPOFOL N/A 01/16/2018   Procedure: COLONOSCOPY WITH PROPOFOL;  Surgeon: Manya Silvas, MD;  Location[facility];  Service: Endoscopy;  Laterality: N/A;   ESOPHAGOGASTRODUODENOSCOPY (EGD) WITH PROPOFOL N/A 01/16/2018   Procedure: ESOPHAGOGASTRODUODENOSCOPY (EGD) WITH PROPOFOL;  Surgeon: Manya Silvas, MD;  Location: [facility] ENDOSCOPY;  Service: Endoscopy;  Laterality: N/A;   EYE SURGERY     GALLBLADDER SURGERY  2002   JOINT REPLACEMENT Right 2018   KNEE ARTHROSCOPY     left    PARATHYROIDECTOMY N/A 07/25/2020   Procedure: PARATHYROIDECTOMY;  Surgeon: Fredirick Maudlin, MD;  Location: [facility] OR[facility];  Service: General;  Laterality: N/A;   ROTATOR CUFF REPAIR  2001   left    TEE WITHOUT CARDIOVERSION N/A 04/26/2018   Procedure: TRANSESOPHAGEAL ECHOCARDIOGRAM (TEE);  Surgeon: Nelva Bush, MD;  Location: [facility];  Service: Cardiovascular;  Laterality: N/A;   TONSILLECTOMY     TOTAL KNEE ARTHROPLASTY Right 06/20/2017   Procedure: RIGHT TOTAL KNEE ARTHROPLASTY;  Surgeon: Gaynelle Arabian, MD;  Location: [facility];  Service: Orthopedics;  Laterality: Right;    Current Outpatient Medications  Medication Sig Dispense Refill   amiodarone (PACERONE) 200 MG tablet TAKE 1 TABLET BY MOUTH EVERY DAY 90 tablet 2   Apoaequorin (PREVAGEN) 10 MG CAPS Take 10 mg by mouth daily.     aspirin EC 81 MG tablet Take 81 mg by mouth daily. Swallow whole.     dutasteride (AVODART) 0.5 MG capsule TAKE 1 CAPSULE DAILY 90 capsule 3   ELIQUIS 5  MG TABS tablet TAKE 1 TABLET TWICE A DAY 180 tablet 1   esomeprazole (NEXIUM) 40 MG capsule TAKE 1 CAPSULE TWICE DAILY 180 capsule 3   FARXIGA 10 MG TABS tablet TAKE 1 TABLET BY MOUTH EVERY DAY 90 tablet 3   ferrous sulfate 325 (65 FE) MG tablet Take 1 tablet by mouth daily.     fexofenadine (ALLEGRA) 180 MG tablet Take 180 mg by mouth daily.     furosemide (LASIX) 40 MG tablet TAKE 1 TABLET BY MOUTH EVERY DAY 90 tablet 3   gabapentin (NEURONTIN) 100 MG capsule Take 1 capsule (100 mg total) by mouth 2 (two) times daily. 180 capsule 3   gabapentin (NEURONTIN) 300 MG capsule Take 1 capsule (300 mg total) by mouth at bedtime. 90 capsule 1   Insulin Glargine (BASAGLAR KWIKPEN) 100 UNIT/ML INJECT 40 UNITS            SUBCUTANEOUSLY TWO TIMES A DAY 45 mL 3   JANUVIA 100 MG tablet TAKE 1 TABLET DAILY 90 tablet 3   magnesium oxide (MAG-OX) 400 MG tablet TAKE 1 TABLET BY MOUTH TWICE A DAY 180 tablet 3   metoprolol tartrate (LOPRESSOR) 100 MG tablet TAKE 1 TABLET BY MOUTH TWICE A DAY 180 tablet 4   naloxone (NARCAN) nasal spray  4 mg/0.1 mL Please use in the event of overdose from narcotic medication 1 each 1   NOVOLOG FLEXPEN 100 UNIT/ML FlexPen INJECT 14 UNITS            SUBCUTANEOUSLY IN THE      MORNING AND AT BEDTIME 15 mL 6   Omega-3 Fatty Acids (FISH OIL) 1200 MG CAPS Take 1,200 mg by mouth 2 (two) times daily.      oxyCODONE (OXYCONTIN) 40 mg 12 hr tablet Take 1 tablet (40 mg total) by mouth every 8 (eight) hours as needed. 90 tablet 0   pregabalin (LYRICA) 200 MG capsule TAKE 1 CAPSULE 3 TIMES A   DAY 270 capsule 1   rOPINIRole (REQUIP) 1 MG tablet TAKE 1 TO 2 TABLETS BY MOUTH AT BEDTIME AS NEEDED (Patient taking differently: Take 1-2 mg by mouth 2 (two) times daily.) 180 tablet 1   rOPINIRole (REQUIP) 2 MG tablet TAKE 1 TABLET BY MOUTH TWICE A DAY AS NEEDED (Patient taking differently: Take 2 mg by mouth at bedtime.) 180 tablet 0   sucralfate (CARAFATE) 1 g tablet TAKE 1 TABLET BY MOUTH 4 (FOUR)  TIMES DAILY - WITH MEALS AND AT BEDTIME. 360 tablet 1   tamsulosin (FLOMAX) 0.4 MG CAPS capsule Take 0.4 mg by mouth daily.     testosterone cypionate (DEPOTESTOSTERONE CYPIONATE) 200 MG/ML injection Inject 200 mg into the muscle every 21 ( twenty-one) days.      VICTOZA 18 MG/3ML SOPN INJECT 1.'8MG'$  SUBCUTANEOUSLYDAILY 27 mL 3   Continuous Blood Gluc Receiver (FREESTYLE LIBRE 2 READER) DEVI Inject 1 Device into the skin daily. DGX (Patient not taking: Reported on 01/31/2023) 1 each 3   Continuous Blood Gluc Sensor (FREESTYLE LIBRE 2 SENSOR) MISC 2 Devices by Does not apply route daily. (Patient not taking: Reported on 01/31/2023) 2 each 0   cyclobenzaprine (FLEXERIL) 10 MG tablet TAKE 1 TABLET BY MOUTH THREE TIMES A DAY (Patient not taking: Reported on 01/31/2023) 90 tablet 12   ONETOUCH ULTRA test strip USE WITH METER TWICE DAILY (Patient not taking: Reported on 01/31/2023) 100 strip 6   sildenafil (REVATIO) 20 MG tablet Take 1 tablet (20 mg total) by mouth in the morning and at bedtime. (Patient not taking: Reported on 01/31/2023) 60 tablet 0   No current facility-administered medications for this visit.    Allergies:   Carisoprodol   Social History:  The patient  reports that he quit smoking about 33 years ago. His smoking use included cigarettes. He has a 40.00 pack-year smoking history. He has never used smokeless tobacco. He reports that he does not drink alcohol and does not use drugs.   Family History:   family history includes Heart attack in his brother.    Review of Systems: Review of Systems  Constitutional: Negative.   HENT: Negative.    Respiratory: Negative.    Cardiovascular: Negative.   Gastrointestinal: Negative.   Musculoskeletal:  Positive for back pain and joint pain.       Tingling foot pain  Neurological: Negative.   Psychiatric/Behavioral: Negative.    All other systems reviewed and are negative.   PHYSICAL EXAM: VS:  BP (!) 128/50 (BP Location: Left Arm, Patient  Position: Sitting, Cuff Size: Normal)   Pulse 68   Ht '5\' 10"'$  (1.778 m)   Wt 275 lb 4 oz (124.9 kg)   SpO2 98%   BMI 39.49 kg/m  , BMI Body mass index is 39.49 kg/m. Constitutional:  oriented to person, place, and  time. No distress.  HENT:  Head: Grossly normal Eyes:  no discharge. No scleral icterus.  Neck: No JVD, no carotid bruits  Cardiovascular: Regular rate and rhythm, no murmurs appreciated Pulmonary/Chest: Clear to auscultation bilaterally, no wheezes or rails Abdominal: Soft.  no distension.  no tenderness.  Musculoskeletal: Normal range of motion Neurological:  normal muscle tone. Coordination normal. No atrophy Skin: Skin warm and dry Psychiatric: normal affect, pleasant  Recent Labs: 06/08/2022: ALT 19; BUN 19; Creatinine, Ser 1.43; Hemoglobin 15.3; Platelets 103; Potassium 4.6; Sodium 146; TSH 6.660    Lipid Panel Lab Results  Component Value Date   CHOL 143 06/08/2022   HDL 23 (L) 06/08/2022   LDLCALC 73 06/08/2022   TRIG 292 (H) 06/08/2022      Wt Readings from Last 3 Encounters:  01/31/23 275 lb 4 oz (124.9 kg)  12/29/22 269 lb 3.2 oz (122.1 kg)  11/30/22 275 lb 6.4 oz (124.9 kg)     ASSESSMENT AND PLAN:  Atrial flutter, unspecified type ([place]) - Plan: EKG 12-Lead Maintaining normal sinus rhythm,prior cardioversion 2021 On  anticoagulation/Eliquis, metoprolol, amiodarone Continue current therapy  HYPERTENSION, BENIGN -  Blood pressure is well controlled on today's visit. No changes made to the medications.  Paroxysmal atrial fibrillation ([place]) - Plan: EKG 12-Lead On amiodarone and Eliquis, metoprolol 100 twice daily atrial flutter on EKG since June 2021, required cardioversion Maintaining normal sinus rhythm since that time  Hyperlipidemia Reasonable numbers but has been trending upwards with weight gain Unable to exercise Recommend calorie restriction  Chronic diastolic CHF (congestive heart failure) (HCC) appears euvolemic On Lasix 40  daily,  Chronic stable brawny discoloration of lower extremity, unchanged  Uncontrolled type 2 diabetes mellitus with complication, with long-term current Calorie restriction recommended A1c running 7-8  Primary osteoarthritis of both knees Limited mobility secondary to back and leg discomfort, knee pain Reports he has delayed surgery on his knee to have his back evaluated  Leg swelling Significant venous component, recommended compression hose, Has difficulty with leg elevation secondary to chronic back pain  Neuropathy, chronic back pain chronic pain History of fusions Reports is scheduled for PT, has follow-up with neurosurgery If surgery is required, would be acceptable risk from cardiac perspective No further cardiac testing would be needed   Total encounter time more than 30 minutes  Greater than 50% was spent in counseling and coordination of care with the patient    No orders of the defined types were placed in this encounter.    Signed, Esmond Plants, M.D., Ph.D. 01/31/2023  [place], [place]

## 2023-01-31 ENCOUNTER — Ambulatory Visit: Payer: Medicare Other | Attending: Cardiovascular Disease | Admitting: Cardiovascular Disease

## 2023-01-31 ENCOUNTER — Encounter: Payer: Self-pay | Admitting: Family Medicine

## 2023-01-31 ENCOUNTER — Encounter: Payer: Self-pay | Admitting: Cardiovascular Disease

## 2023-01-31 VITALS — BP 128/50 | HR 68 | Ht 70.0 in | Wt 275.2 lb

## 2023-01-31 DIAGNOSIS — I5032 Chronic diastolic (congestive) heart failure: Secondary | ICD-10-CM

## 2023-01-31 DIAGNOSIS — I480 Paroxysmal atrial fibrillation: Secondary | ICD-10-CM | POA: Diagnosis not present

## 2023-01-31 DIAGNOSIS — N183 Chronic kidney disease, stage 3 unspecified: Secondary | ICD-10-CM | POA: Diagnosis not present

## 2023-01-31 DIAGNOSIS — I10 Essential (primary) hypertension: Secondary | ICD-10-CM | POA: Diagnosis not present

## 2023-01-31 DIAGNOSIS — I483 Typical atrial flutter: Secondary | ICD-10-CM | POA: Insufficient documentation

## 2023-01-31 DIAGNOSIS — E782 Mixed hyperlipidemia: Secondary | ICD-10-CM | POA: Diagnosis not present

## 2023-01-31 DIAGNOSIS — I700 Atherosclerosis of aorta: Secondary | ICD-10-CM | POA: Insufficient documentation

## 2023-01-31 NOTE — Patient Instructions (Signed)
Medication Instructions:  No changes  If you need a refill on your cardiac medications before your next appointment, please call your pharmacy.   Lab work: No new labs needed  Testing/Procedures: No new testing needed  Follow-Up: At [facility], you and your health needs are our priority.  As part of our continuing mission to provide you with exceptional heart care, we have created designated Provider Care Teams.  These Care Teams include your primary Cardiologist (physician) and Advanced Practice Providers (APPs -  Physician Assistants and Nurse Practitioners) who all work together to provide you with the care you need, when you need it.  You will need a follow up appointment in 12 months  Providers on your designated Care Team:   Christopher Berge, NP Ryan Dunn, PA-C Cadence Furth, PA-C  COVID-19 Vaccine Information can be found at: [email or web address] For questions related to vaccine distribution or appointments, please email [email or web address] or call [phone].   

## 2023-02-03 ENCOUNTER — Ambulatory Visit (INDEPENDENT_AMBULATORY_CARE_PROVIDER_SITE_OTHER): Payer: Medicare Other | Admitting: Podiatry

## 2023-02-03 DIAGNOSIS — I739 Peripheral vascular disease, unspecified: Secondary | ICD-10-CM

## 2023-02-03 DIAGNOSIS — M79675 Pain in left toe(s): Secondary | ICD-10-CM | POA: Diagnosis not present

## 2023-02-03 DIAGNOSIS — E1142 Type 2 diabetes mellitus with diabetic polyneuropathy: Secondary | ICD-10-CM

## 2023-02-03 DIAGNOSIS — B351 Tinea unguium: Secondary | ICD-10-CM | POA: Diagnosis not present

## 2023-02-03 DIAGNOSIS — M79674 Pain in right toe(s): Secondary | ICD-10-CM

## 2023-02-03 NOTE — Progress Notes (Signed)
  Subjective:  Patient ID: Jerry Parsons, male    DOB: [birth year],  MRN: [id number]  Chief Complaint  Patient presents with   Nail Problem    DFC [id number] A1C-8.1    72 y.o. male presents with the above complaint. History confirmed with patient. Patient presenting with pain related to dystrophic thickened elongated nails. Patient is unable to trim own nails related to nail dystrophy and/or mobility issues. 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   Objective:  Physical Exam: warm, good capillary refill nail exam onychomycosis of the toenails, onycholysis, and dystrophic nails DP pulses palpable, PT pulses palpable, and protective sensation absent Left Foot:  Pain with palpation of nails due to elongation and dystrophic growth.  Right Foot: Pain with palpation of nails due to elongation and dystrophic growth.   Assessment:   1. Pain due to onychomycosis of toenails of both feet   2. Diabetic polyneuropathy associated with type 2 diabetes mellitus ([place])   3. PAD (peripheral artery disease) ([place])       Plan:  Patient was evaluated and treated and all questions answered.  #Onychomycosis with pain  -Nails palliatively debrided as below. -Educated on self-care  Procedure: Nail Debridement Rationale: Pain Type of Debridement: manual, sharp debridement. Instrumentation: Nail nipper, rotary burr. Number of Nails: 10  Return in about 3 months (around 05/04/2023) for [facility].         Everitt Amber, DPM [facility]

## 2023-02-07 ENCOUNTER — Telehealth: Payer: Self-pay | Admitting: Family Medicine

## 2023-02-08 DIAGNOSIS — E161 Other hypoglycemia: Secondary | ICD-10-CM | POA: Diagnosis not present

## 2023-02-10 ENCOUNTER — Encounter: Payer: Self-pay | Admitting: Ophthalmology

## 2023-02-14 NOTE — Discharge Instructions (Signed)

## 2023-02-16 ENCOUNTER — Ambulatory Visit: Payer: Medicare Other | Admitting: Anesthesiology

## 2023-02-16 ENCOUNTER — Encounter: Payer: Self-pay | Admitting: Ophthalmology

## 2023-02-16 ENCOUNTER — Encounter
Admission: RE | Disposition: A | Discharge status: Home / Self Care | Payer: Self-pay | Source: Home / Self Care | Attending: Ophthalmology

## 2023-02-16 ENCOUNTER — Other Ambulatory Visit: Payer: Self-pay

## 2023-02-16 ENCOUNTER — Ambulatory Visit
Admission: RE | Admit: 2023-02-16 | Discharge: 2023-02-16 | Disposition: A | Discharge status: Home / Self Care | Payer: Medicare Other | Attending: Ophthalmology | Admitting: Ophthalmology

## 2023-02-16 DIAGNOSIS — I4891 Unspecified atrial fibrillation: Secondary | ICD-10-CM | POA: Insufficient documentation

## 2023-02-16 DIAGNOSIS — Z96651 Presence of right artificial knee joint: Secondary | ICD-10-CM | POA: Insufficient documentation

## 2023-02-16 DIAGNOSIS — H268 Other specified cataract: Secondary | ICD-10-CM | POA: Insufficient documentation

## 2023-02-16 DIAGNOSIS — I110 Hypertensive heart disease with heart failure: Secondary | ICD-10-CM | POA: Insufficient documentation

## 2023-02-16 DIAGNOSIS — K219 Gastro-esophageal reflux disease without esophagitis: Secondary | ICD-10-CM | POA: Diagnosis not present

## 2023-02-16 DIAGNOSIS — Z86718 Personal history of other venous thrombosis and embolism: Secondary | ICD-10-CM | POA: Diagnosis not present

## 2023-02-16 DIAGNOSIS — N400 Enlarged prostate without lower urinary tract symptoms: Secondary | ICD-10-CM | POA: Insufficient documentation

## 2023-02-16 DIAGNOSIS — Z87891 Personal history of nicotine dependence: Secondary | ICD-10-CM | POA: Diagnosis not present

## 2023-02-16 DIAGNOSIS — I509 Heart failure, unspecified: Secondary | ICD-10-CM | POA: Insufficient documentation

## 2023-02-16 DIAGNOSIS — Z794 Long term (current) use of insulin: Secondary | ICD-10-CM | POA: Insufficient documentation

## 2023-02-16 DIAGNOSIS — Z86711 Personal history of pulmonary embolism: Secondary | ICD-10-CM | POA: Diagnosis not present

## 2023-02-16 DIAGNOSIS — H5461 Unqualified visual loss, right eye, normal vision left eye: Secondary | ICD-10-CM | POA: Diagnosis not present

## 2023-02-16 DIAGNOSIS — I5032 Chronic diastolic (congestive) heart failure: Secondary | ICD-10-CM | POA: Diagnosis not present

## 2023-02-16 DIAGNOSIS — H5703 Miosis: Secondary | ICD-10-CM | POA: Diagnosis not present

## 2023-02-16 DIAGNOSIS — Z8673 Personal history of transient ischemic attack (TIA), and cerebral infarction without residual deficits: Secondary | ICD-10-CM | POA: Diagnosis not present

## 2023-02-16 DIAGNOSIS — G4730 Sleep apnea, unspecified: Secondary | ICD-10-CM | POA: Insufficient documentation

## 2023-02-16 DIAGNOSIS — E1136 Type 2 diabetes mellitus with diabetic cataract: Secondary | ICD-10-CM | POA: Insufficient documentation

## 2023-02-16 DIAGNOSIS — Z7984 Long term (current) use of oral hypoglycemic drugs: Secondary | ICD-10-CM | POA: Diagnosis not present

## 2023-02-16 DIAGNOSIS — Z7985 Long-term (current) use of injectable non-insulin antidiabetic drugs: Secondary | ICD-10-CM | POA: Diagnosis not present

## 2023-02-16 DIAGNOSIS — H2589 Other age-related cataract: Secondary | ICD-10-CM | POA: Diagnosis not present

## 2023-02-16 HISTORY — PX: CATARACT EXTRACTION W/PHACO: SHX586

## 2023-02-16 LAB — GLUCOSE, CAPILLARY: Glucose-Capillary: 123 mg/dL — ABNORMAL HIGH (ref 70–99)

## 2023-02-16 SURGERY — PHACOEMULSIFICATION, CATARACT, WITH IOL INSERTION
Anesthesia: Monitor Anesthesia Care | Site: Eye | Laterality: Right

## 2023-02-16 MED ORDER — TRYPAN BLUE 0.06 % IO SOSY
PREFILLED_SYRINGE | INTRAOCULAR | Status: DC | PRN
  Administered 2023-02-16: .2 mL via INTRAOCULAR

## 2023-02-16 MED ORDER — CEFUROXIME OPHTHALMIC INJECTION 1 MG/0.1 ML
INJECTION | OPHTHALMIC | Status: DC | PRN
  Administered 2023-02-16: .1 mL via INTRACAMERAL

## 2023-02-16 MED ORDER — BRIMONIDINE TARTRATE-TIMOLOL 0.2-0.5 % OP SOLN
OPHTHALMIC | Status: DC | PRN
  Administered 2023-02-16: 1 [drp] via OPHTHALMIC

## 2023-02-16 MED ORDER — LIDOCAINE HCL (PF) 4 % IJ SOLN
INTRAMUSCULAR | Status: DC | PRN
  Administered 2023-02-16: 3 mL via OPHTHALMIC

## 2023-02-16 MED ORDER — SIGHTPATH DOSE#1 NA HYALUR & NA CHOND-NA HYALUR IO KIT
PACK | INTRAOCULAR | Status: DC | PRN
  Administered 2023-02-16: 1 via OPHTHALMIC

## 2023-02-16 MED ORDER — NEOMYCIN-POLYMYXIN-DEXAMETH 3.5-10000-0.1 OP OINT
TOPICAL_OINTMENT | OPHTHALMIC | Status: DC | PRN
  Administered 2023-02-16: 1 via OPHTHALMIC

## 2023-02-16 MED ORDER — TETRACAINE HCL 0.5 % OP SOLN
1.0000 [drp] | OPHTHALMIC | Status: DC | PRN
  Administered 2023-02-16 (×3): 1 [drp] via OPHTHALMIC

## 2023-02-16 MED ORDER — SODIUM HYALURONATE 23MG/ML IO SOSY
PREFILLED_SYRINGE | INTRAOCULAR | Status: DC | PRN
  Administered 2023-02-16: .6 mL via INTRAOCULAR

## 2023-02-16 MED ORDER — SIGHTPATH DOSE#1 BSS IO SOLN
INTRAOCULAR | Status: DC | PRN
  Administered 2023-02-16: 15 mL

## 2023-02-16 MED ORDER — SIGHTPATH DOSE#1 BSS IO SOLN
INTRAOCULAR | Status: DC | PRN
  Administered 2023-02-16: 1 mL via INTRAMUSCULAR

## 2023-02-16 MED ORDER — SIGHTPATH DOSE#1 BSS IO SOLN
INTRAOCULAR | Status: DC | PRN
  Administered 2023-02-16: 190 mL via OPHTHALMIC

## 2023-02-16 MED ORDER — ARMC OPHTHALMIC DILATING DROPS
1.0000 | OPHTHALMIC | Status: DC | PRN
  Administered 2023-02-16 (×3): 1 via OPHTHALMIC

## 2023-02-16 MED ORDER — SODIUM CHLORIDE 0.9% FLUSH
INTRAVENOUS | Status: DC | PRN
  Administered 2023-02-16: 10 mL via INTRAVENOUS

## 2023-02-16 MED ORDER — MIDAZOLAM HCL 2 MG/2ML IJ SOLN
INTRAMUSCULAR | Status: DC | PRN
  Administered 2023-02-16: 2 mg via INTRAVENOUS

## 2023-02-16 MED ORDER — LACTATED RINGERS IV SOLN: INTRAVENOUS | Status: DC

## 2023-02-16 SURGICAL SUPPLY — 15 items
CANNULA ANT/CHMB 27G (MISCELLANEOUS) IMPLANT
CANNULA ANT/CHMB 27GA (MISCELLANEOUS) ×1 IMPLANT
CATARACT SUITE SIGHTPATH (MISCELLANEOUS) ×1 IMPLANT
FEE CATARACT SUITE SIGHTPATH (MISCELLANEOUS) ×1 IMPLANT
GLOVE SRG 8 PF TXTR STRL LF DI (GLOVE) ×1 IMPLANT
GLOVE SURG ENC TEXT LTX SZ7.5 (GLOVE) ×1 IMPLANT
GLOVE SURG UNDER POLY LF SZ8 (GLOVE) ×1
LENS IOL DIOP 21.5 (Intraocular Lens) ×1 IMPLANT
LENS IOL TECNIS MONO 21.5 (Intraocular Lens) IMPLANT
NDL FILTER BLUNT 18X1 1/2 (NEEDLE) ×1 IMPLANT
NDL RETROBULBAR .5 NSTRL (NEEDLE) IMPLANT
NEEDLE FILTER BLUNT 18X1 1/2 (NEEDLE) ×1 IMPLANT
RING MALYGIN 7.0 (MISCELLANEOUS) IMPLANT
SYR 3ML LL SCALE MARK (SYRINGE) ×1 IMPLANT
WATER STERILE IRR 250ML POUR (IV SOLUTION) ×1 IMPLANT

## 2023-02-16 NOTE — Anesthesia Procedure Notes (Signed)
Procedure Name: MAC Date/Time: 02/16/2023 7:44 AM  Performed by: Hilbert Odor, CRNAPre-anesthesia Checklist: Patient identified, Emergency Drugs available, Suction available, Patient being monitored and Timeout performed Oxygen Delivery Method: Nasal cannula Induction Type: IV induction

## 2023-02-16 NOTE — Anesthesia Postprocedure Evaluation (Signed)
Anesthesia Post Note  Patient: Jerry Parsons  Procedure(s) Performed: CATARACT EXTRACTION PHACO AND INTRAOCULAR LENS PLACEMENT (IOC) RIGHT DIABETIC MALYUGIN HEALON 5 VISION BLUE  65.52  03:57.7 (Right: Eye)  Patient location during evaluation: Phase II Anesthesia Type: MAC Level of consciousness: awake and alert Pain management: pain level controlled Vital Signs Assessment: post-procedure vital signs reviewed and stable Respiratory status: spontaneous breathing, nonlabored ventilation, respiratory function stable and patient connected to nasal cannula oxygen Cardiovascular status: stable and blood pressure returned to baseline Postop Assessment: no apparent nausea or vomiting Anesthetic complications: no   No notable events documented.   Last Vitals:  Vitals:   02/16/23 0821 02/16/23 0822  BP:  (!) 116/52  Pulse:  (!) 57  Resp:  (!) 9  Temp: 36.5 C 36.5 C  SpO2:  92%    Last Pain:  Vitals:   02/16/23 0822  TempSrc:   PainSc: 0-No pain                 Precious Haws Zaneta Lightcap

## 2023-02-16 NOTE — Op Note (Signed)
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  Vision Blue dye was used to stain the lens capsule. Malyugin ring was used to enlarge the pupil  LENS:   Implant Name Type Inv. Item Serial No. Manufacturer Lot No. LRB No. Used Action  LENS IOL DIOP 21.5 - [id number] Intraocular Lens LENS IOL DIOP 21.5 [id number] [employer]  Right 1 Implanted    [id number]   ULTRASOUND TIME:  3 minutes 58 seconds, CDE 65.5  SURGEON:  Wyonia Hough, MD   ANESTHESIA:  Topical with tetracaine drops augmented with 1% preservative-free intracameral lidocaine.   COMPLICATIONS:  None.   DESCRIPTION OF PROCEDURE: The patient was identified in the holding room and transported to the operating room and placed in the supine position under the operating microscope.  The right eye was identified as the operative eye and it was prepped and draped in the usual sterile ophthalmic fashion.  A 1 millimeter clear-corneal paracentesis was made at the 12:00 position.  0.5 ml of preservative-free 1% lidocaine was injected into the anterior chamber. The anterior chamber was filled with Healon 5 viscoelastic.    Vision Blue dye was then injected under the viscoelastic to stain the lens capsule.  BSS was then used to wash the dye out.  Additional Healon 5 was placed into the anterior chamber. A 2.4 millimeter keratome was used to make a near-clear corneal incision at the 9:00 position.  A Malyugin ring was placed to enlarge the pupil to 76m.  A curvilinear capsulorrhexis was made with a cystotome and capsulorrhexis forceps.  Balanced salt solution was used to hydrodissect and hydrodelineate the nucleus.  Viscoat was then placed in the anterior chamber.    Phacoemulsification was then used in stop and chop fashion to remove the lens nucleus and epinucleus. Generous Healon 5 and Viscoat were used to protect the endothelium. The remaining cortex was then removed using the irrigation and aspiration handpiece. Provisc was then placed into the capsular bag to distend it for lens placement.  A 21.5 -diopter lens was then injected into the capsular bag. The Malyugin ring was removed The remaining viscoelastic was aspirated.   Wounds were hydrated with balanced salt solution.  The anterior chamber was inflated to a physiologic pressure with balanced salt solution. Cefuroxime 0.1 ml of a 172mml solution was injected into the anterior chamber for a dose of 1 mg of intracameral antibiotic at the completion of the case.  No wound leaks were noted.  Topical Timolol and Brimonidine drops and Maxitrol ointment were applied to the eye. An eye shield was placed.  The patient was taken to the recovery room in stable condition without complications of anesthesia or surgery.  [name] 02/16/2023, 8:11 AM

## 2023-02-16 NOTE — H&P (Signed)
[facility]   Primary Care Physician:  Eulas Post, MD Ophthalmologist: Dr. Leandrew Koyanagi  Pre-Procedure History & Physical: HPI:  Jerry Parsons is a 72 y.o. male here for ophthalmic surgery.   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    x2   GERD (gastroesophageal reflux disease)    Headache    h/o as a child   Hypercholesteremia    Hypertension    Kidney failure    acute   Neuromuscular disorder (HCC)    Neuropathy    Pneumonia    time 9 ;last episode 12/2015   Pulmonary embolus ([place]) 2011   Seasonal allergies    Seizures ([place])    as child    Sleep apnea    BIPAP   Stiff neck    limited turning s/p titanium plate placement   TIA (transient ischemic attack)    Wears dentures    full upper and lower    Past Surgical History:  Procedure Laterality Date   BACK SURGERY     x 8; upper x 3 & lower x 5   CARDIOVERSION  03/14/13, 10/16   2014 - [place], 2016 - [place]   CARDIOVERSION N/A 09/11/2020   Procedure: CARDIOVERSION;  Surgeon: Minna Merritts, MD;  Location: [facility] ORS;  Service: Cardiovascular;  Laterality: N/A;   CATARACT EXTRACTION W/PHACO Left 10/29/2015   Procedure: CATARACT EXTRACTION PHACO AND INTRAOCULAR LENS PLACEMENT (Micro);  Surgeon: Leandrew Koyanagi, MD;  Location: [place];  Service: Ophthalmology;  Laterality: Left;  DIABETIC - insulin and oral medsSleep apnea - no machine   CHOLECYSTECTOMY     COLONOSCOPY WITH PROPOFOL N/A 01/16/2018   Procedure: COLONOSCOPY WITH PROPOFOL;  Surgeon: Manya Silvas, MD;  Location: [facility] ENDOSCOPY;  Service: Endoscopy;  Laterality: N/A;   ESOPHAGOGASTRODUODENOSCOPY (EGD) WITH PROPOFOL N/A  01/16/2018   Procedure: ESOPHAGOGASTRODUODENOSCOPY (EGD) WITH PROPOFOL;  Surgeon: Manya Silvas, MD;  Location: [facility] ENDOSCOPY;  Service: Endoscopy;  Laterality: N/A;   EYE SURGERY     GALLBLADDER SURGERY  2002   JOINT REPLACEMENT Right 2018   KNEE ARTHROSCOPY     left    PARATHYROIDECTOMY N/A 07/25/2020   Procedure: PARATHYROIDECTOMY;  Surgeon: Fredirick Maudlin, MD;  Location: [facility] ORS;  Service: General;  Laterality: N/A;   ROTATOR CUFF REPAIR  2001   left    TEE WITHOUT CARDIOVERSION N/A 04/26/2018   Procedure: TRANSESOPHAGEAL ECHOCARDIOGRAM (TEE);  Surgeon: Nelva Bush, MD;  Location: [facility];  Service: Cardiovascular;  Laterality: N/A;   TONSILLECTOMY     TOTAL KNEE ARTHROPLASTY Right 06/20/2017   Procedure: RIGHT TOTAL KNEE ARTHROPLASTY;  Surgeon: Gaynelle Arabian, MD;  [facility];  Service: Orthopedics;  Laterality: Right;    Prior to Admission medications   Medication Sig Start Date End Date Taking? Authorizing Provider  amiodarone (PACERONE) 200 MG tablet TAKE 1 TABLET BY MOUTH EVERY DAY 08/16/22  Yes Gollan, Kathlene November, MD  Apoaequorin (PREVAGEN) 10 MG CAPS Take 10 mg by mouth daily.   Yes [provider]  aspirin EC 81 MG tablet Take 81 mg by mouth daily. Swallow whole.  Yes [provider]  dutasteride (AVODART) 0.5 MG capsule TAKE 1 CAPSULE DAILY 09/10/22  Yes Eulas Post, MD  ELIQUIS 5 MG TABS tablet TAKE 1 TABLET TWICE A DAY 12/13/22  Yes Minna Merritts, MD  esomeprazole (NEXIUM) 40 MG capsule TAKE 1 CAPSULE TWICE DAILY 07/26/22  Yes Eulas Post, MD  FARXIGA 10 MG TABS tablet TAKE 1 TABLET BY MOUTH EVERY DAY 10/04/22  Yes Ostwalt, Janna, PA-C  ferrous sulfate 325 (65 FE) MG tablet Take 1 tablet by mouth daily. 08/12/21  Yes [provider]  fexofenadine (ALLEGRA) 180 MG tablet Take 180 mg by mouth daily.   Yes [provider]  furosemide (LASIX) 40 MG tablet TAKE 1 TABLET BY MOUTH EVERY DAY 09/07/22  Yes Gollan,  Kathlene November, MD  gabapentin (NEURONTIN) 100 MG capsule Take 1 capsule (100 mg total) by mouth 2 (two) times daily. 11/12/22  Yes Birdie Sons, MD  gabapentin (NEURONTIN) 300 MG capsule Take 1 capsule (300 mg total) by mouth at bedtime. 11/12/22  Yes Birdie Sons, MD  Insulin Glargine [facility]) 100 UNIT/ML INJECT 40 UNITS            SUBCUTANEOUSLY TWO TIMES A DAY 10/07/22  Yes Simmons-Robinson, Makiera, MD  JANUVIA 100 MG tablet TAKE 1 TABLET DAILY 09/10/22  Yes Eulas Post, MD  magnesium oxide (MAG-OX) 400 MG tablet TAKE 1 TABLET BY MOUTH TWICE A DAY 07/26/22  Yes Eulas Post, MD  metoprolol tartrate (LOPRESSOR) 100 MG tablet TAKE 1 TABLET BY MOUTH TWICE A DAY 07/12/22  Yes Eulas Post, MD  NOVOLOG FLEXPEN 100 UNIT/ML FlexPen INJECT 14 UNITS            SUBCUTANEOUSLY IN THE      MORNING AND AT BEDTIME 04/26/22  Yes Eulas Post, MD  Omega-3 Fatty Acids (FISH OIL) 1200 MG CAPS Take 1,200 mg by mouth 2 (two) times daily.    Yes [provider]  oxyCODONE (OXYCONTIN) 40 mg 12 hr tablet Take 1 tablet (40 mg total) by mouth every 8 (eight) hours as needed. 12/09/22  Yes Simmons-Robinson, Makiera, MD  pregabalin (LYRICA) 200 MG capsule TAKE 1 CAPSULE 3 TIMES A   DAY 09/10/22  Yes Eulas Post, MD  rOPINIRole (REQUIP) 1 MG tablet TAKE 1 TO 2 TABLETS BY MOUTH AT BEDTIME AS NEEDED Patient taking differently: Take 1-2 mg by mouth 2 (two) times daily. 07/21/22  Yes Eulas Post, MD  rOPINIRole (REQUIP) 2 MG tablet TAKE 1 TABLET BY MOUTH TWICE A DAY AS NEEDED Patient taking differently: Take 2 mg by mouth at bedtime. 09/07/22  Yes Eulas Post, MD  sucralfate (CARAFATE) 1 g tablet TAKE 1 TABLET BY MOUTH 4 (FOUR) TIMES DAILY - WITH MEALS AND AT BEDTIME. 08/16/22  Yes Eulas Post, MD  tamsulosin (FLOMAX) 0.4 MG CAPS capsule Take 0.4 mg by mouth daily. 10/12/20  Yes [provider]  testosterone cypionate (DEPOTESTOSTERONE CYPIONATE) 200  MG/ML injection Inject 200 mg into the muscle every 21 ( twenty-one) days.  05/02/18  Yes [provider]  VICTOZA 18 MG/3ML SOPN INJECT 1.8MG SUBCUTANEOUSLYDAILY 04/26/22  Yes Eulas Post, MD  Continuous Blood Gluc Receiver (FREESTYLE LIBRE 2 READER) DEVI Inject 1 Device into the skin daily. DGX Patient not taking: Reported on 01/31/2023 01/13/23   Simmons-Robinson, Riki Sheer, MD  Continuous Blood Gluc Sensor (FREESTYLE LIBRE 2 SENSOR) MISC 2 Devices by Does not apply route daily. Patient not taking: Reported  on 01/31/2023 01/13/23   Simmons-Robinson, Riki Sheer, MD  cyclobenzaprine (FLEXERIL) 10 MG tablet TAKE 1 TABLET BY MOUTH THREE TIMES A DAY Patient not taking: Reported on 01/31/2023 09/07/22   Eulas Post, MD  naloxone [facility]) nasal spray 4 mg/0.1 mL Please use in the event of overdose from narcotic medication 12/29/22   Eulis Foster, MD  [facility] ULTRA test strip USE WITH METER TWICE DAILY Patient not taking: Reported on 01/31/2023 09/13/22   Eulas Post, MD  sildenafil (REVATIO) 20 MG tablet Take 1 tablet (20 mg total) by mouth in the morning and at bedtime. Patient not taking: Reported on 01/31/2023 12/06/22   Eulis Foster, MD    Allergies as of 01/27/2023 - Review Complete 12/29/2022  Allergen Reaction Noted   Carisoprodol Itching 07/11/2015    Family History  Problem Relation Age of Onset   Heart attack Brother     Social History   Socioeconomic History   Marital status: Married    Spouse name: Rise Paganini    Number of children: 1   Years of education: Not on file   Highest education level: 6th grade  Occupational History   Occupation: retired  Tobacco Use   Smoking status: Former    Packs/day: 2.00    Years: 20.00    Total pack years: 40.00    Types: Cigarettes    Quit date: 12/26/1989    Years since quitting: 33.1   Smokeless tobacco: Never  Vaping Use   Vaping Use: Never used  Substance and Sexual Activity   Alcohol use: No    Drug use: No   Sexual activity: Not on file  Other Topics Concern   Not on file  Social History Narrative   Lives at home with wife    Social Determinants of Health   Financial Resource Strain: Low Risk  (10/05/2018)   Overall Financial Resource Strain (CARDIA)    Difficulty of Paying Living Expenses: Not hard at all  Food Insecurity: No Food Insecurity (10/05/2018)   Hunger Vital Sign    Worried About Running Out of Food in the Last Year: Never true    [place] in the Last Year: Never true  Transportation Needs: No Transportation Needs (10/05/2018)   PRAPARE - Hydrologist (Medical): No    Lack of Transportation (Non-Medical): No  Physical Activity: Inactive (10/31/2019)   Exercise Vital Sign    Days of Exercise per Week: 0 days    Minutes of Exercise per Session: 0 min  Stress: No Stress Concern Present (10/31/2019)   [place]    Feeling of Stress : Not at all  Social Connections: Unknown (10/05/2018)   Social Connection and Isolation Panel [NHANES]    Frequency of Communication with Friends and Family: Patient refused    Frequency of Social Gatherings with Friends and Family: Patient refused    Attends Religious Services: Patient refused    Active Member of Clubs or Organizations: Patient refused    Attends Archivist Meetings: Patient refused    Marital Status: Patient refused  Intimate Partner Violence: Unknown (10/05/2018)   Humiliation, Afraid, Rape, and Kick questionnaire    Fear of Current or Ex-Partner: Patient refused    Emotionally Abused: Patient refused    Physically Abused: Patient refused    Sexually Abused: Patient refused    Review of Systems: See HPI, otherwise negative ROS  Physical Exam: BP (!) 153/84  Pulse 60   Temp 98.1 F (36.7 C) (Temporal)   Resp 12   Ht 5' 11"$  (1.803 m)   Wt 107.3 kg   SpO2 92%   BMI 32.99 kg/m  General:    Alert,  pleasant and cooperative in NAD Head:  Normocephalic and atraumatic. Lungs:  Clear to auscultation.    Heart:  Regular rate and rhythm.   Impression/Plan: KARTHIKEYA CORTEZ is here for ophthalmic surgery.  Risks, benefits, limitations, and alternatives regarding ophthalmic surgery have been reviewed with the patient.  Questions have been answered.  All parties agreeable.   Leandrew Koyanagi, MD  02/16/2023, 7:30 AM

## 2023-02-16 NOTE — Transfer of Care (Signed)
Immediate Anesthesia Transfer of Care Note  Patient: Jerry Parsons  Procedure(s) Performed: CATARACT EXTRACTION PHACO AND INTRAOCULAR LENS PLACEMENT (IOC) RIGHT DIABETIC MALYUGIN HEALON 5 VISION BLUE  65.52  03:57.7 (Right: Eye)  Patient Location: PACU  Anesthesia Type: MAC  Level of Consciousness: awake, alert  and patient cooperative  Airway and Oxygen Therapy: Patient Spontanous Breathing and Patient connected to supplemental oxygen  Post-op Assessment: Post-op Vital signs reviewed, Patient's Cardiovascular Status Stable, Respiratory Function Stable, Patent Airway and No signs of Nausea or vomiting  Post-op Vital Signs: Reviewed and stable  Complications: No notable events documented.

## 2023-02-16 NOTE — Anesthesia Preprocedure Evaluation (Signed)
Anesthesia Evaluation  Patient identified by MRN, date of birth, ID band Patient awake    Reviewed: Allergy & Precautions, NPO status , Patient's Chart, lab work & pertinent test results  History of Anesthesia Complications Negative for: history of anesthetic complications  Airway Mallampati: III  TM Distance: <3 FB Neck ROM: full    Dental  (+) Upper Dentures, Lower Dentures   Pulmonary sleep apnea , former smoker   Pulmonary exam normal        Cardiovascular Exercise Tolerance: Good hypertension, + angina  +CHF  + dysrhythmias Atrial Fibrillation      Neuro/Psych  Headaches, Seizures -,  TIA Neuromuscular disease  negative psych ROS   GI/Hepatic Neg liver ROS,GERD  Controlled,,  Endo/Other  negative endocrine ROSdiabetes    Renal/GU Renal disease     Musculoskeletal   Abdominal   Peds  Hematology negative hematology ROS (+)   Anesthesia Other Findings Past Medical History: No date: Arrhythmia     Comment:  tachycardia, A-Fib No date: Arthritis 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 No date: BPH (benign prostatic hyperplasia) No date: Diabetes mellitus 02/2010: DVT (deep venous thrombosis) (HCC)     Comment:  leg thrombus ; dislodged into emboli and caused PE No date: Dyspnea No date: Dysrhythmia No date: 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     Comment:  x2 No date: GERD (gastroesophageal reflux disease) No date: Headache     Comment:  h/o as a child No date: Hypercholesteremia No date: Hypertension No date: Kidney failure     Comment:  acute No date: Neuromuscular disorder ([place]) No date: Neuropathy No date: Pneumonia     Comment:  time 54 ;last episode 12/2015 2011: Pulmonary embolus ([place]) No date: Seasonal allergies No date: Seizures ([place])     Comment:  as child  No date: Sleep apnea     Comment:  BIPAP No date: Stiff neck     Comment:  limited turning s/p titanium plate placement No date: TIA  (transient ischemic attack) No date: Wears dentures     Comment:  full upper and lower  Past Surgical History: No date: BACK SURGERY     Comment:  x 8; upper x 3 & lower x 5 03/14/13, 10/16: CARDIOVERSION     Comment:  2014 - [place], 2016 - [place] 09/11/2020: CARDIOVERSION; N/A     Comment:  Procedure: CARDIOVERSION;  Surgeon: Minna Merritts,               MD;  Location: [facility];  Service: Cardiovascular;                Laterality: N/A; 10/29/2015: CATARACT EXTRACTION W/PHACO; Left     Comment:  Procedure: CATARACT EXTRACTION PHACO AND INTRAOCULAR               LENS PLACEMENT (IOC);  Surgeon: Leandrew Koyanagi, MD;               Location: [place];  Service: Ophthalmology;                Laterality: Left;  DIABETIC - insulin and oral medsSleep               apnea - no machine No date: CHOLECYSTECTOMY 01/16/2018: COLONOSCOPY WITH PROPOFOL; N/A     Comment:  Procedure: COLONOSCOPY WITH PROPOFOL;  Surgeon: Manya Silvas, MD;  Location: [facility] ENDOSCOPY;  Service:  Endoscopy;  Laterality: N/A; 01/16/2018: ESOPHAGOGASTRODUODENOSCOPY (EGD) WITH PROPOFOL; N/A     Comment:  Procedure: ESOPHAGOGASTRODUODENOSCOPY (EGD) WITH               PROPOFOL;  Surgeon: Manya Silvas, MD;  Location:               [facility] ENDOSCOPY;  Service: Endoscopy;  Laterality: N/A; No date: EYE SURGERY 2002: GALLBLADDER SURGERY 2018: JOINT REPLACEMENT; Right No date: KNEE ARTHROSCOPY     Comment:  left  07/25/2020: PARATHYROIDECTOMY; N/A     Comment:  Procedure: PARATHYROIDECTOMY;  Surgeon: Fredirick Maudlin, MD;  Location: [facility];  Service: General;                Laterality: N/A; 2001: [place]:  left  04/26/2018: TEE WITHOUT CARDIOVERSION; N/A     Comment:  Procedure: TRANSESOPHAGEAL ECHOCARDIOGRAM (TEE);                Surgeon: Nelva Bush, MD;  Location: [facility];                Service: Cardiovascular;  Laterality: N/A; No  date: TONSILLECTOMY 06/20/2017: TOTAL KNEE ARTHROPLASTY; Right     Comment:  Procedure: RIGHT TOTAL KNEE ARTHROPLASTY;  Surgeon:               Gaynelle Arabian, MD;  [facility]: [facility];  Service:               Orthopedics;  Laterality: Right;  BMI    Body Mass Index: 32.99 kg/m      Reproductive/Obstetrics negative OB ROS                             Anesthesia Physical Anesthesia Plan  ASA: 3  Anesthesia Plan: MAC   Post-op Pain Management:    Induction: Intravenous  PONV Risk Score and Plan:   Airway Management Planned: Natural Airway and Nasal Cannula  Additional Equipment:   Intra-op Plan:   Post-operative Plan:   Informed Consent: I have reviewed the patients History and Physical, chart, labs and discussed the procedure including the risks, benefits and alternatives for the proposed anesthesia with the patient or authorized representative who has indicated his/her understanding and acceptance.     Dental Advisory Given  Plan Discussed with: Anesthesiologist, CRNA and Surgeon  Anesthesia Plan Comments: (Patient consented for risks of anesthesia including but not limited to:  - adverse reactions to medications - damage to eyes, teeth, lips or other oral mucosa - nerve damage due to positioning  - sore throat or hoarseness - Damage to heart, brain, nerves, lungs, other parts of body or loss of life  Patient voiced understanding.)       Anesthesia Quick Evaluation

## 2023-02-17 ENCOUNTER — Encounter: Payer: Self-pay | Admitting: Ophthalmology

## 2023-02-24 DIAGNOSIS — M1812 Unilateral primary osteoarthritis of first carpometacarpal joint, left hand: Secondary | ICD-10-CM | POA: Diagnosis not present

## 2023-02-24 DIAGNOSIS — M65342 Trigger finger, left ring finger: Secondary | ICD-10-CM | POA: Diagnosis not present

## 2023-02-24 DIAGNOSIS — M189 Osteoarthritis of first carpometacarpal joint, unspecified: Secondary | ICD-10-CM | POA: Diagnosis not present

## 2023-02-24 DIAGNOSIS — M65352 Trigger finger, left little finger: Secondary | ICD-10-CM | POA: Diagnosis not present

## 2023-02-28 ENCOUNTER — Ambulatory Visit: Admit: 2023-02-28 | Payer: Medicare Other | Admitting: Orthopedic Surgery

## 2023-02-28 SURGERY — ARTHROPLASTY, KNEE, TOTAL
Anesthesia: Choice | Site: Knee | Laterality: Left

## 2023-03-24 ENCOUNTER — Other Ambulatory Visit: Payer: Self-pay | Admitting: Family Medicine

## 2023-03-24 DIAGNOSIS — R2681 Unsteadiness on feet: Secondary | ICD-10-CM | POA: Diagnosis not present

## 2023-03-24 DIAGNOSIS — R42 Dizziness and giddiness: Secondary | ICD-10-CM | POA: Diagnosis not present

## 2023-03-24 DIAGNOSIS — M7021 Olecranon bursitis, right elbow: Secondary | ICD-10-CM | POA: Diagnosis not present

## 2023-03-24 DIAGNOSIS — E1142 Type 2 diabetes mellitus with diabetic polyneuropathy: Secondary | ICD-10-CM | POA: Diagnosis not present

## 2023-03-24 DIAGNOSIS — E1122 Type 2 diabetes mellitus with diabetic chronic kidney disease: Secondary | ICD-10-CM | POA: Diagnosis not present

## 2023-03-24 DIAGNOSIS — N183 Chronic kidney disease, stage 3 unspecified: Secondary | ICD-10-CM | POA: Diagnosis not present

## 2023-03-24 DIAGNOSIS — N529 Male erectile dysfunction, unspecified: Secondary | ICD-10-CM

## 2023-03-24 DIAGNOSIS — Z794 Long term (current) use of insulin: Secondary | ICD-10-CM | POA: Diagnosis not present

## 2023-03-24 DIAGNOSIS — N451 Epididymitis: Secondary | ICD-10-CM | POA: Diagnosis not present

## 2023-03-24 DIAGNOSIS — G4733 Obstructive sleep apnea (adult) (pediatric): Secondary | ICD-10-CM | POA: Diagnosis not present

## 2023-03-24 DIAGNOSIS — I700 Atherosclerosis of aorta: Secondary | ICD-10-CM | POA: Diagnosis not present

## 2023-03-24 DIAGNOSIS — I480 Paroxysmal atrial fibrillation: Secondary | ICD-10-CM | POA: Diagnosis not present

## 2023-03-25 NOTE — Telephone Encounter (Signed)
Requested medication (s) are due for refill today: routing for review  Requested medication (s) are on the active medication list: yes  Last refill:  12/06/22  Future visit scheduled: no  Notes to clinic:  Unable to refill per protocol, Patient not taking: Reported on 01/31/2023. Routing for approval, medication still on current list.      Requested Prescriptions  Pending Prescriptions Disp Refills   sildenafil (REVATIO) 20 MG tablet [Pharmacy Med Name: SILDENAFIL 20 MG TABLET] 180 tablet 1    Sig: Take 1 tablet (20 mg total) by mouth in the morning and at bedtime.     Urology: Erectile Dysfunction Agents Passed - 03/24/2023  4:44 PM      Passed - AST in normal range and within 360 days    AST  Date Value Ref Range Status  06/08/2022 19 0 - 40 IU/L Final   SGOT(AST)  Date Value Ref Range Status  04/12/2013 22 15 - 37 Unit/L Final         Passed - ALT in normal range and within 360 days    ALT  Date Value Ref Range Status  06/08/2022 19 0 - 44 IU/L Final   SGPT (ALT)  Date Value Ref Range Status  04/12/2013 29 12 - 78 U/L Final         Passed - Last BP in normal range    BP Readings from Last 1 Encounters:  02/16/23 (!) 116/52         Passed - Valid encounter within last 12 months    Recent Outpatient Visits           2 months ago Type 2 diabetes mellitus with diabetic polyneuropathy, with long-term current use of insulin ([place])   [place] Simmons-Robinson, [place], MD   4 months ago Olecranon bursitis of right elbow   [place] Birdie Sons, MD   4 months ago Olecranon bursitis of right elbow   [place], Donald E, MD   7 months ago Muscle strain of left forearm, initial encounter   [place] Eulas Post, MD   9 months ago HYPERTENSION, [place] Eulas Post, MD

## 2023-04-06 DIAGNOSIS — H02203 Unspecified lagophthalmos right eye, unspecified eyelid: Secondary | ICD-10-CM | POA: Diagnosis not present

## 2023-04-06 DIAGNOSIS — H189 Unspecified disorder of cornea: Secondary | ICD-10-CM | POA: Diagnosis not present

## 2023-04-12 DIAGNOSIS — R42 Dizziness and giddiness: Secondary | ICD-10-CM | POA: Diagnosis not present

## 2023-04-12 DIAGNOSIS — R2681 Unsteadiness on feet: Secondary | ICD-10-CM | POA: Diagnosis not present

## 2023-04-12 DIAGNOSIS — G9389 Other specified disorders of brain: Secondary | ICD-10-CM | POA: Diagnosis not present

## 2023-04-20 ENCOUNTER — Other Ambulatory Visit: Payer: Self-pay | Admitting: Family Medicine

## 2023-04-20 NOTE — Telephone Encounter (Signed)
Medication Refill - Medication: VICTOZA 18 MG/3ML SOPN   Pt is out of medication.   Has the patient contacted their pharmacy? Yes.   Stacy with [facility] is calling in to request refill for pt.   Preferred Pharmacy (with phone number or street name):  [facility] - [place], [place] - [address] AT Portal to [facility] Phone: [phone]  Fax: [phone]     Has the patient been seen for an appointment in the last year OR does the patient have an upcoming appointment? Yes.    Agent: Please be advised that RX refills may take up to 3 business days. We ask that you follow-up with your pharmacy.

## 2023-04-20 NOTE — Telephone Encounter (Signed)
Unable to refill per protocol, last refill by another provider no longer at this practice.   Requested Prescriptions  Pending Prescriptions Disp Refills   liraglutide (VICTOZA) 18 MG/3ML SOPN 27 mL 3     Endocrinology:  Diabetes - GLP-1 Receptor Agonists Failed - 04/20/2023 12:18 PM      Failed - HBA1C is between 0 and 7.9 and within 180 days    Hgb A1c MFr Bld  Date Value Ref Range Status  12/29/2022 CANCELED %     Comment:    Test not performed. Specimen could not be located. [facility] is providing the patient with re-collection instructions.          Prediabetes: 5.7 - 6.4          Diabetes: >6.4          Glycemic control for adults with diabetes: <7.0  Result canceled by the ancillary.          Passed - Valid encounter within last 6 months    Recent Outpatient Visits           3 months ago Type 2 diabetes mellitus with diabetic polyneuropathy, with long-term current use of insulin (HCC)   [place] [facility] [place] Simmons-Robinson, [place], MD   5 months ago Olecranon bursitis of right elbow   [place] [facility] Malva Limes, MD   5 months ago Olecranon bursitis of right elbow   [facility] Malva Limes, MD   8 months ago Muscle strain of left forearm, initial encounter   [facility] Bosie Clos, MD   10 months ago HYPERTENSION, BENIGN   [place] [facility] Bosie Clos, MD

## 2023-04-28 DIAGNOSIS — Z9089 Acquired absence of other organs: Secondary | ICD-10-CM | POA: Diagnosis not present

## 2023-04-28 DIAGNOSIS — E1122 Type 2 diabetes mellitus with diabetic chronic kidney disease: Secondary | ICD-10-CM | POA: Diagnosis not present

## 2023-04-28 DIAGNOSIS — G4733 Obstructive sleep apnea (adult) (pediatric): Secondary | ICD-10-CM | POA: Diagnosis not present

## 2023-04-28 DIAGNOSIS — E1142 Type 2 diabetes mellitus with diabetic polyneuropathy: Secondary | ICD-10-CM | POA: Diagnosis not present

## 2023-04-28 DIAGNOSIS — Z794 Long term (current) use of insulin: Secondary | ICD-10-CM | POA: Diagnosis not present

## 2023-04-28 DIAGNOSIS — I480 Paroxysmal atrial fibrillation: Secondary | ICD-10-CM | POA: Diagnosis not present

## 2023-04-28 DIAGNOSIS — M48062 Spinal stenosis, lumbar region with neurogenic claudication: Secondary | ICD-10-CM | POA: Diagnosis not present

## 2023-04-28 DIAGNOSIS — R600 Localized edema: Secondary | ICD-10-CM | POA: Diagnosis not present

## 2023-04-28 DIAGNOSIS — Z9889 Other specified postprocedural states: Secondary | ICD-10-CM | POA: Diagnosis not present

## 2023-04-28 DIAGNOSIS — Z6838 Body mass index (BMI) 38.0-38.9, adult: Secondary | ICD-10-CM | POA: Diagnosis not present

## 2023-04-28 DIAGNOSIS — N183 Chronic kidney disease, stage 3 unspecified: Secondary | ICD-10-CM | POA: Diagnosis not present

## 2023-05-02 DIAGNOSIS — G8929 Other chronic pain: Secondary | ICD-10-CM | POA: Diagnosis not present

## 2023-05-02 DIAGNOSIS — R262 Difficulty in walking, not elsewhere classified: Secondary | ICD-10-CM | POA: Diagnosis not present

## 2023-05-02 DIAGNOSIS — M545 Low back pain, unspecified: Secondary | ICD-10-CM | POA: Diagnosis not present

## 2023-05-02 DIAGNOSIS — Z7689 Persons encountering health services in other specified circumstances: Secondary | ICD-10-CM | POA: Diagnosis not present

## 2023-05-02 DIAGNOSIS — R2681 Unsteadiness on feet: Secondary | ICD-10-CM | POA: Diagnosis not present

## 2023-05-02 DIAGNOSIS — R42 Dizziness and giddiness: Secondary | ICD-10-CM | POA: Diagnosis not present

## 2023-05-04 DIAGNOSIS — M48062 Spinal stenosis, lumbar region with neurogenic claudication: Secondary | ICD-10-CM | POA: Diagnosis not present

## 2023-05-04 DIAGNOSIS — R339 Retention of urine, unspecified: Secondary | ICD-10-CM | POA: Diagnosis not present

## 2023-05-04 DIAGNOSIS — N451 Epididymitis: Secondary | ICD-10-CM | POA: Diagnosis not present

## 2023-05-04 DIAGNOSIS — I480 Paroxysmal atrial fibrillation: Secondary | ICD-10-CM | POA: Diagnosis not present

## 2023-05-05 ENCOUNTER — Ambulatory Visit (INDEPENDENT_AMBULATORY_CARE_PROVIDER_SITE_OTHER): Payer: Medicare Other | Admitting: Podiatry

## 2023-05-05 ENCOUNTER — Other Ambulatory Visit: Payer: Self-pay | Admitting: Cardiovascular Disease

## 2023-05-05 DIAGNOSIS — M79675 Pain in left toe(s): Secondary | ICD-10-CM

## 2023-05-05 DIAGNOSIS — B351 Tinea unguium: Secondary | ICD-10-CM

## 2023-05-05 DIAGNOSIS — I480 Paroxysmal atrial fibrillation: Secondary | ICD-10-CM

## 2023-05-05 DIAGNOSIS — M79674 Pain in right toe(s): Secondary | ICD-10-CM | POA: Diagnosis not present

## 2023-05-05 DIAGNOSIS — E1142 Type 2 diabetes mellitus with diabetic polyneuropathy: Secondary | ICD-10-CM

## 2023-05-05 DIAGNOSIS — I739 Peripheral vascular disease, unspecified: Secondary | ICD-10-CM

## 2023-05-05 NOTE — Progress Notes (Signed)
  Subjective:  Patient ID: Jerry Parsons, male    DOB: 10/29/1951,  MRN: [id number]  Chief Complaint  Patient presents with   Nail Problem    [facility] BS-[id number] A1C-7.4    72 y.o. male presents with the above complaint. History confirmed with patient. Patient presenting with pain related to dystrophic thickened elongated nails. Patient is unable to trim own nails related to nail dystrophy and/or mobility issues. 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   Objective:  Physical Exam: warm, good capillary refill nail exam onychomycosis of the toenails, onycholysis, and dystrophic nails DP pulses palpable, PT pulses palpable, and protective sensation absent Left Foot:  Pain with palpation of nails due to elongation and dystrophic growth.  Right Foot: Pain with palpation of nails due to elongation and dystrophic growth.   Assessment:   1. Pain due to onychomycosis of toenails of both feet   2. Diabetic polyneuropathy associated with type 2 diabetes mellitus (HCC)   3. PAD (peripheral artery disease) (HCC)        Plan:  Patient was evaluated and treated and all questions answered.  #Onychomycosis with pain  -Nails palliatively debrided as below. -Educated on self-care  Procedure: Nail Debridement Rationale: Pain Type of Debridement: manual, sharp debridement. Instrumentation: Nail nipper, rotary burr. Number of Nails: 10  Return in about 3 months (around 08/05/2023) for [facility].         Corinna Gab, DPM [facility]

## 2023-05-05 NOTE — Telephone Encounter (Signed)
Prescription refill request for Eliquis received. Indication: A Flutter Last office visit: 01/31/23  Concha Se MD Scr: 1.2 on 01/27/23  Epic Age: 72 Weight: 124.9kg  Based on above findings Eliquis 5mg  twice daily is the appropriate dose.  Refill approved.

## 2023-05-05 NOTE — Telephone Encounter (Signed)
Refill request

## 2023-05-27 ENCOUNTER — Emergency Department
Admission: EM | Admit: 2023-05-27 | Discharge: 2023-05-27 | Disposition: A | Discharge status: Home / Self Care | Payer: Medicare Other | Attending: Emergency Medicine | Admitting: Emergency Medicine

## 2023-05-27 ENCOUNTER — Emergency Department: Payer: Medicare Other

## 2023-05-27 ENCOUNTER — Other Ambulatory Visit: Payer: Self-pay

## 2023-05-27 DIAGNOSIS — R0789 Other chest pain: Secondary | ICD-10-CM | POA: Diagnosis present

## 2023-05-27 DIAGNOSIS — Z1152 Encounter for screening for COVID-19: Secondary | ICD-10-CM | POA: Insufficient documentation

## 2023-05-27 DIAGNOSIS — J181 Lobar pneumonia, unspecified organism: Secondary | ICD-10-CM | POA: Diagnosis not present

## 2023-05-27 DIAGNOSIS — W07XXXA Fall from chair, initial encounter: Secondary | ICD-10-CM | POA: Diagnosis not present

## 2023-05-27 DIAGNOSIS — M542 Cervicalgia: Secondary | ICD-10-CM | POA: Diagnosis not present

## 2023-05-27 DIAGNOSIS — S20212A Contusion of left front wall of thorax, initial encounter: Secondary | ICD-10-CM

## 2023-05-27 DIAGNOSIS — I5032 Chronic diastolic (congestive) heart failure: Secondary | ICD-10-CM | POA: Diagnosis not present

## 2023-05-27 DIAGNOSIS — R079 Chest pain, unspecified: Secondary | ICD-10-CM | POA: Diagnosis not present

## 2023-05-27 DIAGNOSIS — I700 Atherosclerosis of aorta: Secondary | ICD-10-CM | POA: Diagnosis not present

## 2023-05-27 DIAGNOSIS — M19011 Primary osteoarthritis, right shoulder: Secondary | ICD-10-CM | POA: Diagnosis not present

## 2023-05-27 DIAGNOSIS — E119 Type 2 diabetes mellitus without complications: Secondary | ICD-10-CM | POA: Insufficient documentation

## 2023-05-27 DIAGNOSIS — Z043 Encounter for examination and observation following other accident: Secondary | ICD-10-CM | POA: Diagnosis not present

## 2023-05-27 DIAGNOSIS — S0990XA Unspecified injury of head, initial encounter: Secondary | ICD-10-CM

## 2023-05-27 DIAGNOSIS — I110 Hypertensive heart disease with heart failure: Secondary | ICD-10-CM | POA: Insufficient documentation

## 2023-05-27 DIAGNOSIS — R9082 White matter disease, unspecified: Secondary | ICD-10-CM | POA: Diagnosis not present

## 2023-05-27 DIAGNOSIS — J189 Pneumonia, unspecified organism: Secondary | ICD-10-CM | POA: Diagnosis not present

## 2023-05-27 DIAGNOSIS — Z794 Long term (current) use of insulin: Secondary | ICD-10-CM | POA: Insufficient documentation

## 2023-05-27 DIAGNOSIS — R918 Other nonspecific abnormal finding of lung field: Secondary | ICD-10-CM | POA: Diagnosis not present

## 2023-05-27 DIAGNOSIS — W19XXXA Unspecified fall, initial encounter: Secondary | ICD-10-CM

## 2023-05-27 LAB — COMPREHENSIVE METABOLIC PANEL
ALT: 19 U/L (ref 0–44)
AST: 21 U/L (ref 15–41)
Albumin: 3.9 g/dL (ref 3.5–5.0)
Alkaline Phosphatase: 75 U/L (ref 38–126)
Anion gap: 12 (ref 5–15)
BUN: 29 mg/dL — ABNORMAL HIGH (ref 8–23)
CO2: 26 mmol/L (ref 22–32)
Calcium: 8.1 mg/dL — ABNORMAL LOW (ref 8.9–10.3)
Chloride: 104 mmol/L (ref 98–111)
Creatinine, Ser: 1.88 mg/dL — ABNORMAL HIGH (ref 0.61–1.24)
GFR, Estimated: 37 mL/min — ABNORMAL LOW (ref >60)
Glucose, Bld: 212 mg/dL — ABNORMAL HIGH (ref 70–99)
Potassium: 4.5 mmol/L (ref 3.5–5.1)
Sodium: 142 mmol/L (ref 135–145)
Total Bilirubin: 0.6 mg/dL (ref 0.3–1.2)
Total Protein: 6.9 g/dL (ref 6.5–8.1)

## 2023-05-27 LAB — CBC WITH DIFFERENTIAL/PLATELET
Abs Immature Granulocytes: 0.02 10*3/uL (ref 0.00–0.07)
Basophils Absolute: 0 10*3/uL (ref 0.0–0.1)
Basophils Relative: 0 %
Eosinophils Absolute: 0.2 10*3/uL (ref 0.0–0.5)
Eosinophils Relative: 3 %
HCT: 38.6 % — ABNORMAL LOW (ref 39.0–52.0)
Hemoglobin: 11.7 g/dL — ABNORMAL LOW (ref 13.0–17.0)
Immature Granulocytes: 0 %
Lymphocytes Relative: 23 %
Lymphs Abs: 1.4 10*3/uL (ref 0.7–4.0)
MCH: 28.1 pg (ref 26.0–34.0)
MCHC: 30.3 g/dL (ref 30.0–36.0)
MCV: 92.8 fL (ref 80.0–100.0)
Monocytes Absolute: 0.5 10*3/uL (ref 0.1–1.0)
Monocytes Relative: 7 %
Neutro Abs: 4.1 10*3/uL (ref 1.7–7.7)
Neutrophils Relative %: 67 %
Platelets: 117 10*3/uL — ABNORMAL LOW (ref 150–400)
RBC: 4.16 MIL/uL — ABNORMAL LOW (ref 4.22–5.81)
RDW: 14.4 % (ref 11.5–15.5)
WBC: 6.1 10*3/uL (ref 4.0–10.5)
nRBC: 0 % (ref 0.0–0.2)

## 2023-05-27 LAB — TROPONIN I (HIGH SENSITIVITY)
Troponin I (High Sensitivity): 7 ng/L (ref <18)
Troponin I (High Sensitivity): 6 ng/L (ref <18)

## 2023-05-27 LAB — SARS CORONAVIRUS 2 BY RT PCR: SARS Coronavirus 2 by RT PCR: NEGATIVE

## 2023-05-27 MED ORDER — ACETAMINOPHEN 325 MG PO TABS
650.0000 mg | ORAL_TABLET | Freq: Once | ORAL | Status: AC
  Administered 2023-05-27: 650 mg via ORAL
  Filled 2023-05-27: qty 2

## 2023-05-27 MED ORDER — CEFPODOXIME PROXETIL 200 MG PO TABS: 200.0000 mg | ORAL_TABLET | Freq: Two times a day (BID) | ORAL | 0 refills | Status: AC

## 2023-05-27 MED ORDER — LIDOCAINE 5 % EX PTCH
1.0000 | MEDICATED_PATCH | CUTANEOUS | Status: DC
  Administered 2023-05-27: 1 via TRANSDERMAL
  Filled 2023-05-27: qty 1

## 2023-05-27 MED ORDER — DOXYCYCLINE MONOHYDRATE 100 MG PO TABS: 100.0000 mg | ORAL_TABLET | Freq: Two times a day (BID) | ORAL | 0 refills | Status: AC

## 2023-05-27 NOTE — ED Provider Notes (Signed)
[facility] Provider Note    Event Date/Time   First MD Initiated Contact with Patient 05/27/23 0720     (approximate)   History   Fall   HPI  Jerry Parsons is a 72 y.o. 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  Patient reports that he was sitting in his chair watching a show on his iPad when he fell asleep, and fell forward out of his chair, landing on his right chest wall.  He reports that he also struck his head.  He reports that he has significant pain with palpation to his right chest wall.  He denies trouble breathing.  He has not had any recent cough or fever.  He has not had any vomiting.  He denies paresthesias or weakness.  No abdominal pain.  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   Primary hyperparathyroidism (HCC)    Osteoarthritis of both knees 11/11/2018   OSA on CPAP 10/14/2018   Lumbar spondylosis 05/05/2018   Spinal stenosis of lumbar region 05/05/2018   Constipation, chronic 03/08/2018   Pain in limb 03/03/2018   Gastroesophageal reflux disease without esophagitis 01/04/2018   OA (osteoarthritis) of knee 06/20/2017   Sepsis due to pneumonia (HCC) 05/07/2017   Neuropathy 10/13/2016   Amblyopia 12/30/2015   Cornea scar 12/30/2015   NS (nuclear sclerosis) 12/30/2015   Pseudoaphakia 12/30/2015   Dehydration 09/08/2015   Aspiration pneumonia (HCC) 07/04/2015   Paroxysmal atrial fibrillation (HCC)  07/04/2015   Arthritis of knee, degenerative 05/28/2015   Chronic back pain 09/17/2014   Leg edema 05/07/2014   Chronic diastolic CHF (congestive heart failure) (HCC) 05/07/2014   Campylobacter diarrhea 04/25/2013   Atrial flutter (HCC) 01/23/2013   Obesity 05/19/2012   Hyperlipidemia 11/11/2011   Diastolic dysfunction 04/12/2011   SOB (shortness of breath) 04/12/2011   Diabetes mellitus type 2, uncontrolled, with complications 03/15/2011   HYPERTENSION, BENIGN 03/15/2011   DVT 03/15/2011   TACHYCARDIA 03/15/2011          Physical Exam   Triage Vital Signs: ED Triage Vitals  Enc Vitals Group     BP 05/27/23 0133 (!) 137/43     Pulse Rate 05/27/23 0133 (!) 58     Resp 05/27/23 0133 18     Temp 05/27/23 0133 98.8 F (37.1 C)     Temp Source 05/27/23 0133 Oral     SpO2 05/27/23 0133 92 %     Weight 05/27/23 0134 268 lb (121.6 kg)     Height 05/27/23 0134 5\' 11"  (1.803 m)     Head Circumference --      Peak Flow --      Pain Score 05/27/23 0143 7     Pain Loc --      Pain Edu? --      Excl. in GC? --     Most recent vital signs: Vitals:   05/27/23 0133 05/27/23 0621  BP: (!) 137/43 (!) 135/58  Pulse: (!) 58 (!) 58  Resp: 18  18  Temp: 98.8 F (37.1 C) 98.4 F (36.9 C)  SpO2: 92% 98%    Physical Exam Vitals and nursing note reviewed.  Constitutional:      General: Awake and alert. No acute distress.    Appearance: Normal appearance. The patient is overweight.  HENT:     Head: Normocephalic and atraumatic.     Mouth: Mucous membranes are moist.  Eyes:     General: PERRL. Normal EOMs        Right eye: No discharge.        Left eye: No discharge.     Conjunctiva/sclera: Conjunctivae normal.  Cardiovascular:     Rate and Rhythm: Normal rate and regular rhythm.     Pulses: Normal pulses.  Pulmonary:     Effort: Pulmonary effort is normal. No respiratory distress.     Breath sounds: Normal breath sounds.  Tender right anterior chest wall without  ecchymosis or crepitus Abdominal:     Abdomen is soft. There is no abdominal tenderness. No rebound or guarding. No distention.  Faint bruising to mid abdomen which patient says is from his insulin shots Musculoskeletal:        General: No swelling. Normal range of motion.     Cervical back: Normal range of motion and neck supple. No midline cervical spine tenderness.  Full range of motion of neck.  Negative Spurling test.  Negative Lhermitte sign.  Normal strength and sensation in bilateral upper extremities. Normal grip strength bilaterally.  Normal intrinsic muscle function of the hand bilaterally.  Normal radial pulses bilaterally. Skin:    General: Skin is warm and dry.     Capillary Refill: Capillary refill takes less than 2 seconds.     Findings: No rash.  Neurological:     Mental Status: The patient is awake and alert.      ED Results / Procedures / Treatments   Labs (all labs ordered are listed, but only abnormal results are displayed) Labs Reviewed  CBC WITH DIFFERENTIAL/PLATELET - Abnormal; Notable for the following components:      Result Value   RBC 4.16 (*)    Hemoglobin 11.7 (*)    HCT 38.6 (*)    Platelets 117 (*)    All other components within normal limits  COMPREHENSIVE METABOLIC PANEL - Abnormal; Notable for the following components:   Glucose, Bld 212 (*)    BUN 29 (*)    Creatinine, Ser 1.88 (*)    Calcium 8.1 (*)    GFR, Estimated 37 (*)    All other components within normal limits  SARS CORONAVIRUS 2 BY RT PCR  TROPONIN I (HIGH SENSITIVITY)  TROPONIN I (HIGH SENSITIVITY)     EKG     RADIOLOGY I independently reviewed and interpreted imaging and agree with radiologists findings.     PROCEDURES:  Critical Care performed:   Procedures   MEDICATIONS ORDERED IN ED: Medications  lidocaine (LIDODERM) 5 % 1 patch (1 patch Transdermal Patch Applied 05/27/23 0748)  acetaminophen (TYLENOL) tablet 650 mg (650 mg Oral Given 05/27/23 0748)      IMPRESSION / MDM / ASSESSMENT AND PLAN / ED COURSE  I reviewed the triage vital signs and the nursing notes.   Differential diagnosis includes, but is not limited to, chest wall contusion, rib fracture, pneumothorax, intracranial hemorrhage, cervical spine injury.  Patient is awake and alert, hemodynamically stable and neurologically intact.  CT head obtained in triage is negative for any acute findings.  Chest x-ray reveals  findings consistent with pneumonia, however patient has not had a fever or cough or constitutional type symptoms.  His troponin x 2 was negative.  Other considerations could be a pulmonary contusion given his mechanism of injury.  He does have tenderness to palpation to the area where he struck his chest wall, and I recommended further workup for further evaluation.  CT neck obtained per [place] criteria, as well as CT chest for further identification.  CT neck reveals no acute traumatic injury.  CT chest demonstrated no acute traumatic injury, though he does have a multilobar left lung groundglass opacity most compatible with acute viral or atypical respiratory infection including COVID-19.  Subsequent COVID test obtained is negative.  My original plan was to treat him for community-acquired pneumonia with azithromycin and a cephalosporin, however patient is at significant risk for QTc prolongation with azithromycin with his amiodarone, therefore he was placed on doxycycline instead.  He currently has a normal oxygen saturation of 98% on room air.  He demonstrates no increased work of breathing.  I do not feel that he requires admission at this time.  We discussed all findings and recommendations.  Patient and his wife understand and agree with plan.  They are discharged in stable condition.  I recommended close outpatient follow-up with their outpatient provider, patient and wife agree.   Patient's presentation is most consistent with acute complicated illness / injury  requiring diagnostic workup.    FINAL CLINICAL IMPRESSION(S) / ED DIAGNOSES   Final diagnoses:  Fall, initial encounter  Community acquired pneumonia of left upper lobe of lung  Injury of head, initial encounter  Contusion of left chest wall, initial encounter     Rx / DC Orders   ED Discharge Orders          Ordered    cefpodoxime (VANTIN) 200 MG tablet  2 times daily        05/27/23 0814    doxycycline (ADOXA) 100 MG tablet  2 times daily        05/27/23 4098             Note:  This document was prepared using Dragon voice recognition software and may include unintentional dictation errors.   Jackelyn Hoehn, PA-C 05/27/23 1191    Corena Herter, MD 05/27/23 [phone]

## 2023-05-27 NOTE — ED Provider Notes (Signed)
Fall when falling asleep at a chair with right-sided injury.  Denies any fever, cough or shortness of breath.  Significant pain to the right side of his chest.  Chest x-ray concerning for pneumonia.  Will obtain CT scan to evaluate for possible lung contusions versus pneumonia.  No hypoxia or shortness of breath.  Lab with mild elevation of creatinine up to 1.8 from baseline of 1.4.  Anemia but no signs of a GI bleed.  Patient does take Eliquis.  No signs of intracranial hemorrhage on CT scan.   Corena Herter, MD 05/27/23 [phone]

## 2023-05-27 NOTE — ED Triage Notes (Signed)
Pt and spouse report that pt was sleeping sitting in a chair while watching his Ipad. Pt fell forward out of his chair while asleep and landed on a concrete floor. Reports hitting R side of head, R arm as well. Pt woke upon impact. Reports pain to R chest and shoulder as well. Denies dizziness, chest pain, or SOB prior to impact. Pt alert and oriented on arrival. Breathing unlabored speaking in full sentences.

## 2023-05-27 NOTE — ED Notes (Signed)
See triage note  Presents s/p fall  States he fell from chair and hit concrete floor  Having to right lateral chest wall

## 2023-05-27 NOTE — Discharge Instructions (Signed)
Your blood work does not reveal any acute abnormalities.  Your x-rays did not show any broken bones, though it was found that you have a pneumonia.  I will call you if you have a positive COVID results.  You may also find this results on MyChart.  Please take antibiotics as prescribed.  Please return for any new, worsening, or change in symptoms or other concerns.  It was a pleasure caring for you today.

## 2023-05-31 ENCOUNTER — Other Ambulatory Visit: Payer: Self-pay | Admitting: Family Medicine

## 2023-05-31 ENCOUNTER — Other Ambulatory Visit (INDEPENDENT_AMBULATORY_CARE_PROVIDER_SITE_OTHER): Payer: Self-pay | Admitting: Nurse Practitioner

## 2023-05-31 DIAGNOSIS — I739 Peripheral vascular disease, unspecified: Secondary | ICD-10-CM

## 2023-05-31 NOTE — Telephone Encounter (Signed)
Requested Prescriptions  Pending Prescriptions Disp Refills   Insulin Glargine (BASAGLAR KWIKPEN) 100 UNIT/ML [Pharmacy Med Name: BASAGLAR KWK PEN 100U/ML] 45 mL 1    Sig: INJECT 40 UNITS            SUBCUTANEOUSLY TWO TIMES A DAY     Endocrinology:  Diabetes - Insulins Failed - 05/31/2023 12:23 PM      Failed - HBA1C is between 0 and 7.9 and within 180 days    Hgb A1c MFr Bld  Date Value Ref Range Status  12/29/2022 CANCELED %     Comment:    Test not performed. Specimen could not be located. [facility] is providing the patient with re-collection instructions.          Prediabetes: 5.7 - 6.4          Diabetes: >6.4          Glycemic control for adults with diabetes: <7.0  Result canceled by the ancillary.          Passed - Valid encounter within last 6 months    Recent Outpatient Visits           5 months ago Type 2 diabetes mellitus with diabetic polyneuropathy, with long-term current use of insulin (HCC)   [place] [facility] Simmons[facility]Robinson, [place], MD   6 months ago Olecranon bursitis of right elbow   [place] [facility] Malva Limes, MD   6 months ago Olecranon bursitis of right elbow   [facility] [place] Malva Limes, MD   9 months ago Muscle strain of left forearm, initial encounter   [facility] Bosie Clos, MD   1 year ago HYPERTENSION, BENIGN   [place] [facility] Bosie Clos, MD

## 2023-06-01 ENCOUNTER — Telehealth: Payer: Self-pay

## 2023-06-01 NOTE — Telephone Encounter (Signed)
Transition Care Management Follow-up Telephone Call Date of discharge and from where: 05/27/2023 [facility] How have you been since you were released from the hospital? Patient is still sore, but is feeling a little better Any questions or concerns? No  Items Reviewed: Did the pt receive and understand the discharge instructions provided? Yes  Medications obtained and verified? Yes  Other? No  Any new allergies since your discharge? No  Dietary orders reviewed? Yes Do you have support at home? Yes   Follow up appointments reviewed:  PCP Hospital f/u appt confirmed?  Patient stated he would call for an appointment is he is not feeling better in the next few days  Scheduled to see  on  @ . Specialist Hospital f/u appt confirmed? No  Scheduled to see  on  @ . Are transportation arrangements needed? No  If their condition worsens, is the pt aware to call PCP or go to the Emergency Dept.? Yes Was the patient provided with contact information for the PCP's office or ED? Yes Was to pt encouraged to call back with questions or concerns? Yes  Jerry [facility]ons [facility]  [facility] Community Resource Care Guide   ??[email or web address]  ?? [phone]   Website: [email or web address]  [email or web address]

## 2023-06-02 ENCOUNTER — Encounter (INDEPENDENT_AMBULATORY_CARE_PROVIDER_SITE_OTHER): Payer: Medicare Other | Admitting: Vascular Surgery

## 2023-06-02 ENCOUNTER — Encounter (INDEPENDENT_AMBULATORY_CARE_PROVIDER_SITE_OTHER): Payer: Medicare Other

## 2023-06-14 ENCOUNTER — Encounter (INDEPENDENT_AMBULATORY_CARE_PROVIDER_SITE_OTHER): Payer: Medicare Other

## 2023-06-14 ENCOUNTER — Encounter (INDEPENDENT_AMBULATORY_CARE_PROVIDER_SITE_OTHER): Payer: Medicare Other | Admitting: Nurse Practitioner

## 2023-06-18 ENCOUNTER — Other Ambulatory Visit: Payer: Self-pay | Admitting: Cardiovascular Disease

## 2023-06-28 DIAGNOSIS — G8929 Other chronic pain: Secondary | ICD-10-CM | POA: Diagnosis not present

## 2023-06-28 DIAGNOSIS — R0789 Other chest pain: Secondary | ICD-10-CM | POA: Diagnosis not present

## 2023-06-28 DIAGNOSIS — R001 Bradycardia, unspecified: Secondary | ICD-10-CM | POA: Diagnosis not present

## 2023-06-28 DIAGNOSIS — M48062 Spinal stenosis, lumbar region with neurogenic claudication: Secondary | ICD-10-CM | POA: Diagnosis not present

## 2023-06-28 DIAGNOSIS — Z9889 Other specified postprocedural states: Secondary | ICD-10-CM | POA: Diagnosis not present

## 2023-06-28 DIAGNOSIS — G4733 Obstructive sleep apnea (adult) (pediatric): Secondary | ICD-10-CM | POA: Diagnosis not present

## 2023-06-28 DIAGNOSIS — J159 Unspecified bacterial pneumonia: Secondary | ICD-10-CM | POA: Diagnosis not present

## 2023-06-28 DIAGNOSIS — Z9089 Acquired absence of other organs: Secondary | ICD-10-CM | POA: Diagnosis not present

## 2023-06-28 DIAGNOSIS — I700 Atherosclerosis of aorta: Secondary | ICD-10-CM | POA: Diagnosis not present

## 2023-06-28 DIAGNOSIS — Z6839 Body mass index (BMI) 39.0-39.9, adult: Secondary | ICD-10-CM | POA: Diagnosis not present

## 2023-06-29 ENCOUNTER — Ambulatory Visit (INDEPENDENT_AMBULATORY_CARE_PROVIDER_SITE_OTHER): Payer: Medicare Other | Admitting: Nurse Practitioner

## 2023-06-29 ENCOUNTER — Encounter (INDEPENDENT_AMBULATORY_CARE_PROVIDER_SITE_OTHER): Payer: Self-pay | Admitting: Nurse Practitioner

## 2023-06-29 ENCOUNTER — Ambulatory Visit (INDEPENDENT_AMBULATORY_CARE_PROVIDER_SITE_OTHER): Payer: Medicare Other

## 2023-06-29 VITALS — BP 124/62 | HR 54 | Resp 18 | Ht 71.0 in | Wt 280.0 lb

## 2023-06-29 DIAGNOSIS — I739 Peripheral vascular disease, unspecified: Secondary | ICD-10-CM | POA: Diagnosis not present

## 2023-06-29 DIAGNOSIS — I10 Essential (primary) hypertension: Secondary | ICD-10-CM

## 2023-06-29 DIAGNOSIS — Z794 Long term (current) use of insulin: Secondary | ICD-10-CM

## 2023-06-29 DIAGNOSIS — M7989 Other specified soft tissue disorders: Secondary | ICD-10-CM | POA: Diagnosis not present

## 2023-06-29 DIAGNOSIS — E1142 Type 2 diabetes mellitus with diabetic polyneuropathy: Secondary | ICD-10-CM

## 2023-06-29 LAB — VAS US ABI WITH/WO TBI
Left ABI: 1.08
Right ABI: 1.16

## 2023-06-30 NOTE — Progress Notes (Signed)
Subjective:    Patient ID: LEMON VIEW, male    DOB: 07/07/1951, 72 y.o.   MRN: [id number] Chief Complaint  Patient presents with   New Patient (Initial Visit)    NP. ABI/consult. vascular claudication. potter    Jerry Parsons is a 72 year old male who presents today for evaluation of claudication-like symptoms.  The patient notes having claudication-like symptoms.  He notes that he needs to stop every few feet to let himself rest due to having severe weakness in his lower extremities.  He has had a history of 9 back surgeries.  He notes that this weakness and pain with walking has been ongoing since his last surgery several years ago.  However he notes it seems to be getting worse.  He notes that his last surgery had some complications and he recently saw neurosurgery in December and it noted that there was an error in his fusion and that he may require surgery.  There was concern that his symptoms may be arterial related.  He previously had studies done on his right lower extremity in 2019 which showed an ABI of 1.20 on the right and 1.28 on the left.  Today his noninvasive study shows an ABI of 1.16 on the right and 1.08 on the left.  He has normal TBI's bilaterally with strong triphasic waveforms bilaterally and good toe waveforms bilaterally.    Review of Systems  Cardiovascular:  Positive for leg swelling.  Musculoskeletal:  Positive for arthralgias, back pain and gait problem.  Skin:  Positive for wound.  Neurological:  Positive for weakness.  All other systems reviewed and are negative.      Objective:   Physical Exam Vitals reviewed.  HENT:     Head: Normocephalic.  Cardiovascular:     Rate and Rhythm: Normal rate.  Pulmonary:     Effort: Pulmonary effort is normal.  Musculoskeletal:     Right lower leg: Edema present.     Left lower leg: Edema present.  Skin:    General: Skin is warm and dry.  Neurological:     Mental Status: He is alert and oriented to person,  place, and time.     Gait: Gait abnormal.  Psychiatric:        Mood and Affect: Mood normal.        Behavior: Behavior normal.        Thought Content: Thought content normal.        Judgment: Judgment normal.     BP 124/62 (BP Location: Right Arm)   Pulse (!) 54   Resp 18   Ht 5\' 11"  (1.803 m)   Wt 280 lb (127 kg)   BMI 39.05 kg/m   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    x2   GERD (gastroesophageal reflux disease)    Headache    h/o as a child   Hypercholesteremia    Hypertension    Kidney failure    acute   Neuromuscular disorder (HCC)    Neuropathy    Pneumonia    time 9 ;last episode 12/2015   Pulmonary embolus (HCC) 2011  Seasonal allergies    Seizures (HCC)    as child    Sleep apnea    BIPAP   Stiff neck    limited turning s/p titanium plate placement   TIA (transient ischemic attack)    Wears dentures    full upper and lower    Social History   Socioeconomic History   Marital status: Married    Spouse name: Meriam Sprague    Number of children: 1   Years of education: Not on file   Highest education level: 6th grade  Occupational History   Occupation: retired  Tobacco Use   Smoking status: Former    Packs/day: 2.00    Years: 20.00    Additional pack years: 0.00    Total pack years: 40.00    Types: Cigarettes    Quit date: 12/26/1989    Years since quitting: 33.5   Smokeless tobacco: Never  Vaping Use   Vaping Use: Never used  Substance and Sexual Activity   Alcohol use: No   Drug use: No   Sexual activity: Not on file  Other Topics Concern   Not on file  Social History Narrative   Lives at home with wife    Social Determinants of Health    Financial Resource Strain: Low Risk  (10/05/2018)   Overall Financial Resource Strain (CARDIA)    Difficulty of Paying Living Expenses: Not hard at all  Food Insecurity: No Food Insecurity (10/05/2018)   Hunger Vital Sign    Worried About Running Out of Food in the Last Year: Never true    Ran Out of Food in the Last Year: Never true  Transportation Needs: No Transportation Needs (10/05/2018)   PRAPARE - Administrator, Civil Service (Medical): No    Lack of Transportation (Non-Medical): No  Physical Activity: Inactive (10/31/2019)   Exercise Vital Sign    Days of Exercise per Week: 0 days    Minutes of Exercise per Session: 0 min  Stress: No Stress Concern Present (10/31/2019)   [employer] of Occupational Health - Occupational Stress Questionnaire    Feeling of Stress : Not at all  Social Connections: Unknown (10/05/2018)   Social Connection and Isolation Panel [NHANES]    Frequency of Communication with Friends and Family: Patient declined    Frequency of Social Gatherings with Friends and Family: Patient declined    Attends Religious Services: Patient declined    Active Member of Clubs or Organizations: Patient declined    Attends Banker Meetings: Patient declined    Marital Status: Patient declined  Intimate Partner Violence: Unknown (10/05/2018)   Humiliation, Afraid, Rape, and Kick questionnaire    Fear of Current or Ex-Partner: Patient declined    Emotionally Abused: Patient declined    Physically Abused: Patient declined    Sexually Abused: Patient declined    Past Surgical History:  Procedure Laterality Date   BACK SURGERY     x 8; upper x 3 & lower x 5   CARDIOVERSION  03/14/13, 10/16   2014 - [facility], 2016 - [facility]   CARDIOVERSION N/A 09/11/2020   Procedure: CARDIOVERSION;  Surgeon: Antonieta Iba, MD;  Location: [facility] OR[facility];  Service: Cardiovascular;  Laterality: N/A;   CATARACT EXTRACTION W/PHACO Left 10/29/2015   Procedure: CATARACT  EXTRACTION PHACO AND INTRAOCULAR LENS PLACEMENT (IOC);  Surgeon: Lockie Mola, MD;  Location: [facility];  Service: Ophthalmology;  Laterality: Left;  DIABETIC - insulin and oral medsSleep apnea - no machine  CATARACT EXTRACTION W/PHACO Right 02/16/2023   Procedure: CATARACT EXTRACTION PHACO AND INTRAOCULAR LENS PLACEMENT (IOC) RIGHT DIABETIC MALYUGIN HEALON 5 VISION BLUE  65.52  03:57.7;  Surgeon: Lockie Mola, MD;  Location: [facility];  Service: Ophthalmology;  Laterality: Right;  Diabetic   CHOLECYSTECTOMY     COLONOSCOPY WITH PROPOFOL N/A 01/16/2018   Procedure: COLONOSCOPY WITH PROPOFOL;  Surgeon: Scot Jun, MD;  Location: [facility];  Service: Endoscopy;  Laterality: N/A;   ESOPHAGOGASTRODUODENOSCOPY (EGD) WITH PROPOFOL N/A 01/16/2018   Procedure: ESOPHAGOGASTRODUODENOSCOPY (EGD) WITH PROPOFOL;  Surgeon: Scot Jun, MD;  Location[facility] END[facility];  Service: Endoscopy;  Laterality: N/A;   EYE SURGERY     GALLBLADDER SURGERY  2002   JOINT REPLACEMENT Right 2018   KNEE ARTHROSCOPY     left    PARATHYROIDECTOMY N/A 07/25/2020   Procedure: PARATHYROIDECTOMY;  Surgeon: Duanne Guess, MD;  Location: [facility] ORS;  Service: General;  Laterality: N/A;   ROTATOR CUFF REPAIR  2001   left    TEE WITHOUT CARDIOVERSION N/A 04/26/2018   Procedure: TRANSESOPHAGEAL ECHOCARDIOGRAM (TEE);  Surgeon: Yvonne Kendall, MD;  Location: [facility];  Service: Cardiovascular;  Laterality: N/A;   TONSILLECTOMY     TOTAL KNEE ARTHROPLASTY Right 06/20/2017   Procedure: RIGHT TOTAL KNEE ARTHROPLASTY;  Surgeon: Ollen Gross, MD;  Location: [facility] ORS;  Service: Orthopedics;  Laterality: Right;    Family History  Problem Relation Age of Onset   Heart attack Brother     Allergies  Allergen Reactions   Carisoprodol Itching       Latest Ref Rng & Units 05/27/2023    1:45 AM 06/08/2022    9:45 AM 09/09/2020   11:05 AM  CBC  WBC 4.0 - 10.5 K/uL 6.1  5.6  7.8    Hemoglobin 13.0 - 17.0 g/dL 78.2  95.6  21.3   Hematocrit 39.0 - 52.0 % 38.6  48.3  43.1   Platelets 150 - 400 K/uL 117  103  146       CMP     Component Value Date/Time   NA 142 05/27/2023 0145   NA 146 (H) 06/08/2022 0945   NA 137 04/12/2013 1150   K 4.5 05/27/2023 0145   K 4.0 04/12/2013 1150   CL 104 05/27/2023 0145   CL 104 04/12/2013 1150   CO2 26 05/27/2023 0145   CO2 30 04/12/2013 1150   GLUCOSE 212 (H) 05/27/2023 0145   GLUCOSE 146 (H) 04/12/2013 1150   BUN 29 (H) 05/27/2023 0145   BUN 19 06/08/2022 0945   BUN 11 04/12/2013 1150   CREATININE 1.88 (H) 05/27/2023 0145   CREATININE 1.39 (H) 12/13/2017 1134   CALCIUM 8.1 (L) 05/27/2023 0145   CALCIUM 8.8 04/12/2013 1150   PROT 6.9 05/27/2023 0145   PROT 6.5 06/08/2022 0945   PROT 6.9 04/12/2013 1150   ALBUMIN 3.9 05/27/2023 0145   ALBUMIN 4.3 06/08/2022 0945   ALBUMIN 3.3 (L) 04/12/2013 1150   AST 21 05/27/2023 0145   AST 22 04/12/2013 1150   ALT 19 05/27/2023 0145   ALT 29 04/12/2013 1150   ALKPHOS 75 05/27/2023 0145   ALKPHOS 99 04/12/2013 1150   BILITOT 0.6 05/27/2023 0145   BILITOT 0.6 06/08/2022 0945   BILITOT 0.5 04/12/2013 1150   EGFR 52 (L) 06/08/2022 0945   GFRNONAA 37 (L) 05/27/2023 0145   GFRNONAA 52 (L) 12/13/2017 1134     VAS [place] ABI WITH/WO TBI  Result Date: 06/29/2023  LOWER EXTREMITY DOPPLER STUDY  Patient Name:  Jerry Parsons  Date of Exam:   06/29/2023 Medical Rec #: [id number]       Accession #:    [id number] Date of Birth: Oct 11, 1951       Patient Gender: M Patient Age:   43 years Exam Location:  [facility] Procedure:      VAS [place] ABI WITH/WO TBI Referring Phys: Sheppard Plumber --------------------------------------------------------------------------------  High Risk Factors: Hypertension, hyperlipidemia, insulin dependent diabetes                    mellitus.  Performing Technologist: Hardie Lora RVT  Examination Guidelines: A complete evaluation includes at minimum, Doppler  waveform signals and systolic blood pressure reading at the level of bilateral brachial, anterior tibial, and posterior tibial arteries, when vessel segments are accessible. Bilateral testing is considered an integral part of a complete examination. Photoelectric Plethysmograph (PPG) waveforms and toe systolic pressure readings are included as required and additional duplex testing as needed. Limited examinations for reoccurring indications may be performed as noted.  ABI Findings: +---------+------------------+-----+---------+--------+ Right    Rt Pressure (mmHg)IndexWaveform Comment  +---------+------------------+-----+---------+--------+ Brachial 157                                      +---------+------------------+-----+---------+--------+ PTA      184               1.16 triphasic         +---------+------------------+-----+---------+--------+ DP       175               1.10 triphasic         +---------+------------------+-----+---------+--------+ Great Toe183               1.15                   +---------+------------------+-----+---------+--------+ +---------+------------------+-----+---------+-------+ Left     Lt Pressure (mmHg)IndexWaveform Comment +---------+------------------+-----+---------+-------+ Brachial 159                                     +---------+------------------+-----+---------+-------+ PTA      171               1.08 triphasic        +---------+------------------+-----+---------+-------+ DP       158               0.99 triphasic        +---------+------------------+-----+---------+-------+ Great Toe169               1.06                  +---------+------------------+-----+---------+-------+ +-------+-----------+-----------+------------+----------------+ ABI/TBIToday's ABIToday's TBIPrevious ABIPrevious TBI     +-------+-----------+-----------+------------+----------------+ Right  1.16       1.15       1.20        Non  compressible +-------+-----------+-----------+------------+----------------+ Left   1.08       1.06       1.28                         +-------+-----------+-----------+------------+----------------+ Bilateral ABIs appear essentially unchanged compared to prior study on 03/13/2018.  Summary: Right: Resting right ankle-brachial index is within normal range. The right toe-brachial index is normal. Left: Resting left ankle-brachial index is  within normal range. The left toe-brachial index is normal. *See table(s) above for measurements and observations.  Electronically signed by Levora Dredge MD on 06/29/2023 at 4:28:16 PM.    Final        Assessment & Plan:   1. Claudication [facility]) Recommend:  The patient has atypical pain symptoms for vascular disease and on exam I do not find evidence of vascular pathology that would explain the patient's symptoms.  Noninvasive studies do not identify significant vascular problems  I suspect the patient is c/o pseudoclaudication.  Patient should have an evaluation of the LS spine which I defer to the primary service or the Spine service.  The patient should continue walking and begin a more formal exercise program. The patient should continue his antiplatelet therapy and aggressive treatment of the lipid abnormalities.  Patient will follow-up with me on a PRN basis.  2. HYPERTENSION, BENIGN Continue antihypertensive medications as already ordered, these medications have been reviewed and there are no changes at this time.  3. Type 2 diabetes mellitus with diabetic polyneuropathy, with long-term current use of insulin (HCC) Continue hypoglycemic medications as already ordered, these medications have been reviewed and there are no changes at this time.  Hgb A1C to be monitored as already arranged by primary service  4. Leg swelling The patient has some swelling noted today.  We discussed conservative therapy including use of compression, elevation and  activity.  He has a small wound and they are instructed to use zinc oxide and compression to help with wound.  They have had difficulty with compression so I recommended using Ace wraps in the interim as well as elevation to help with the swelling.  If they continue to have issues advised to contact us and we can possibly place Unna boots.   Current Outpatient Medications on File Prior to Visit  Medication Sig Dispense Refill   amiodarone (PACERONE) 200 MG tablet TAKE 1 TABLET BY MOUTH EVERY DAY 90 tablet 1   Apoaequorin (PREVAGEN) 10 MG CAPS Take 10 mg by mouth daily.     aspirin EC 81 MG tablet Take 81 mg by mouth daily. Swallow whole.     Continuous Blood Gluc Receiver (FREESTYLE LIBRE 2 READER) DEVI Inject 1 Device into the skin daily. DGX 1 each 3   Continuous Blood Gluc Sensor (FREESTYLE LIBRE 2 SENSOR) MISC 2 Devices by Does not apply route daily. 2 each 0   ELIQUIS 5 MG TABS tablet TAKE 1 TABLET TWICE A DAY 180 tablet 1   esomeprazole (NEXIUM) 40 MG capsule TAKE 1 CAPSULE TWICE DAILY 180 capsule 3   FARXIGA 10 MG TABS tablet TAKE 1 TABLET BY MOUTH EVERY DAY 90 tablet 3   ferrous sulfate 325 (65 FE) MG tablet Take 1 tablet by mouth daily.     fexofenadine (ALLEGRA) 180 MG tablet Take 180 mg by mouth daily.     furosemide (LASIX) 40 MG tablet TAKE 1 TABLET BY MOUTH EVERY DAY 90 tablet 3   gabapentin (NEURONTIN) 100 MG capsule Take 1 capsule (100 mg total) by mouth 2 (two) times daily. 180 capsule 3   gabapentin (NEURONTIN) 300 MG capsule Take 1 capsule (300 mg total) by mouth at bedtime. 90 capsule 1   Insulin Glargine (BASAGLAR KWIKPEN) 100 UNIT/ML INJECT 40 UNITS            SUBCUTANEOUSLY TWO TIMES A DAY 45 mL 1   JANUVIA 100 MG tablet TAKE 1 TABLET DAILY 90 tablet 3   magnesium  oxide (MAG-OX) 400 MG tablet TAKE 1 TABLET BY MOUTH TWICE A DAY 180 tablet 3   metoprolol tartrate (LOPRESSOR) 100 MG tablet TAKE 1 TABLET BY MOUTH TWICE A DAY 180 tablet 4   MOUNJARO 2.5 MG/0.5ML Pen Inject 2.5  mg into the skin once a week.     naloxone (NARCAN) nasal spray 4 mg/0.1 mL Please use in the event of overdose from narcotic medication 1 each 1   NOVOLOG FLEXPEN 100 UNIT/ML FlexPen INJECT 14 UNITS            SUBCUTANEOUSLY IN THE      MORNING AND AT BEDTIME 15 mL 6   Omega-3 Fatty Acids (FISH OIL) 1200 MG CAPS Take 1,200 mg by mouth 2 (two) times daily.      ONETOUCH ULTRA test strip USE WITH METER TWICE DAILY 100 strip 6   oxyCODONE (OXYCONTIN) 40 mg 12 hr tablet Take 1 tablet (40 mg total) by mouth every 8 (eight) hours as needed. 90 tablet 0   pregabalin (LYRICA) 200 MG capsule TAKE 1 CAPSULE 3 TIMES A   DAY 270 capsule 1   rOPINIRole (REQUIP) 1 MG tablet TAKE 1 TO 2 TABLETS BY MOUTH AT BEDTIME AS NEEDED (Patient taking differently: Take 1-2 mg by mouth 2 (two) times daily.) 180 tablet 1   rOPINIRole (REQUIP) 2 MG tablet TAKE 1 TABLET BY MOUTH TWICE A DAY AS NEEDED (Patient taking differently: Take 2 mg by mouth at bedtime.) 180 tablet 0   sucralfate (CARAFATE) 1 g tablet TAKE 1 TABLET BY MOUTH 4 (FOUR) TIMES DAILY - WITH MEALS AND AT BEDTIME. 360 tablet 1   tamsulosin (FLOMAX) 0.4 MG CAPS capsule Take 0.4 mg by mouth daily.     No current facility-administered medications on file prior to visit.    There are no Patient Instructions on file for this visit. No follow-ups on file.   Georgiana Spinner, NP

## 2023-07-07 DIAGNOSIS — R2681 Unsteadiness on feet: Secondary | ICD-10-CM | POA: Diagnosis not present

## 2023-07-07 DIAGNOSIS — R262 Difficulty in walking, not elsewhere classified: Secondary | ICD-10-CM | POA: Diagnosis not present

## 2023-07-07 DIAGNOSIS — M545 Low back pain, unspecified: Secondary | ICD-10-CM | POA: Diagnosis not present

## 2023-07-07 DIAGNOSIS — R42 Dizziness and giddiness: Secondary | ICD-10-CM | POA: Diagnosis not present

## 2023-07-07 DIAGNOSIS — G8929 Other chronic pain: Secondary | ICD-10-CM | POA: Diagnosis not present

## 2023-08-05 ENCOUNTER — Ambulatory Visit: Payer: Medicare Other | Admitting: Podiatry

## 2023-08-10 ENCOUNTER — Ambulatory Visit: Payer: Medicare Other | Attending: Neurology | Admitting: Physical Therapy

## 2023-08-10 DIAGNOSIS — R269 Unspecified abnormalities of gait and mobility: Secondary | ICD-10-CM | POA: Insufficient documentation

## 2023-08-10 DIAGNOSIS — R2681 Unsteadiness on feet: Secondary | ICD-10-CM | POA: Insufficient documentation

## 2023-08-10 DIAGNOSIS — M6281 Muscle weakness (generalized): Secondary | ICD-10-CM | POA: Insufficient documentation

## 2023-08-10 DIAGNOSIS — R2689 Other abnormalities of gait and mobility: Secondary | ICD-10-CM | POA: Diagnosis not present

## 2023-08-10 DIAGNOSIS — R262 Difficulty in walking, not elsewhere classified: Secondary | ICD-10-CM | POA: Insufficient documentation

## 2023-08-10 NOTE — Therapy (Signed)
OUTPATIENT PHYSICAL THERAPY NEURO EVALUATION   Patient Name: HENCE Parsons MRN: [id number] DOB:[birth year], 72 y.o., male Today's Date: 08/10/2023   PCP:   Bosie Clos, MD   REFERRING PROVIDER:   Bosie Clos, MD    END OF SESSION:  PT End of Session - 08/10/23 1536     Visit Number 1    Number of Visits 24    Date for PT Re-Evaluation 11/02/23    Progress Note Due on Visit 10    PT Start Time 1533    PT Stop Time 1614    PT Time Calculation (min) 41 min             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    x2   GERD (gastroesophageal reflux disease)    Headache    h/o as a child   Hypercholesteremia    Hypertension    Kidney failure    acute   Neuromuscular disorder (HCC)    Neuropathy    Pneumonia    time 9 ;last episode 12/2015   Pulmonary embolus (HCC) 2011   Seasonal allergies    Seizures (HCC)    as child    Sleep apnea    BIPAP   Stiff neck    limited turning s/p titanium plate placement   TIA (transient ischemic attack)    Wears dentures    full upper and lower   Past Surgical History:  Procedure Laterality Date   BACK SURGERY     x 8; upper x 3 & lower x 5   CARDIOVERSION  03/14/13, 10/16   2014 - [facility], 2016 - [place]   CARDIOVERSION N/A 09/11/2020   Procedure: CARDIOVERSION;  Surgeon: Antonieta Iba, MD;  Location: [facility];  Service: Cardiovascular;  Laterality: N/A;   CATARACT EXTRACTION W/PHACO Left 10/29/2015   Procedure: CATARACT EXTRACTION PHACO AND INTRAOCULAR LENS PLACEMENT (IOC);  Surgeon: Lockie Mola, MD;  Location: [facility];  Service: Ophthalmology;  Laterality: Left;  DIABETIC - insulin and oral  medsSleep apnea - no machine   CATARACT EXTRACTION W/PHACO Right 02/16/2023   Procedure: CATARACT EXTRACTION PHACO AND INTRAOCULAR LENS PLACEMENT (IOC) RIGHT DIABETIC MALYUGIN HEALON 5 VISION BLUE  65.52  03:57.7;  Surgeon: Lockie Mola, MD;  Location: [facility];  Service: Ophthalmology;  Laterality: Right;  Diabetic   CHOLECYSTECTOMY     COLONOSCOPY WITH PROPOFOL N/A 01/16/2018   Procedure: COLONOSCOPY WITH PROPOFOL;  Surgeon: Scot Jun, MD;  Location: [facility];  Service: Endoscopy;  Laterality: N/A;   ESOPHAGOGASTRODUODENOSCOPY (EGD) WITH PROPOFOL N/A 01/16/2018   Procedure: ESOPHAGOGASTRODUODENOSCOPY (EGD) WITH PROPOFOL;  Surgeon: Scot Jun, MD;  Location: [facility];  Service: Endoscopy;  Laterality: N/A;   EYE SURGERY     GALLBLADDER SURGERY  2002   JOINT REPLACEMENT Right 2018   KNEE ARTHROSCOPY     left    PARATHYROIDECTOMY N/A 07/25/2020   Procedure: PARATHYROIDECTOMY;  Surgeon: Duanne Guess, MD;  Location: [facility];  Service: General;  Laterality: N/A;   ROTATOR CUFF REPAIR  2001   left    TEE WITHOUT CARDIOVERSION  N/A 04/26/2018   Procedure: TRANSESOPHAGEAL ECHOCARDIOGRAM (TEE);  Surgeon: Yvonne Kendall, MD;  Location: [facility];  Service: Cardiovascular;  Laterality: N/A;   TONSILLECTOMY     TOTAL KNEE ARTHROPLASTY Right 06/20/2017   Procedure: RIGHT TOTAL KNEE ARTHROPLASTY;  Surgeon: Ollen Gross, MD;  Location: [facility];  Service: Orthopedics;  Laterality: Right;   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   Primary hyperparathyroidism (HCC)     Osteoarthritis of both knees 11/11/2018   OSA on CPAP 10/14/2018   Lumbar spondylosis 05/05/2018   Spinal stenosis of lumbar region 05/05/2018   Constipation, chronic 03/08/2018   Pain in limb 03/03/2018   Gastroesophageal reflux disease without esophagitis 01/04/2018   OA (osteoarthritis) of knee 06/20/2017   Sepsis due to pneumonia (HCC) 05/07/2017   Neuropathy 10/13/2016   Amblyopia 12/30/2015   Cornea scar 12/30/2015   NS (nuclear sclerosis) 12/30/2015   Pseudoaphakia 12/30/2015   Dehydration 09/08/2015   Aspiration pneumonia (HCC) 07/04/2015   Paroxysmal atrial fibrillation (HCC) 07/04/2015   Arthritis of knee, degenerative 05/28/2015   Chronic back pain 09/17/2014   Leg edema 05/07/2014   Chronic diastolic CHF (congestive heart failure) (HCC) 05/07/2014   Campylobacter diarrhea 04/25/2013   Atrial flutter (HCC) 01/23/2013   Obesity 05/19/2012   Hyperlipidemia 11/11/2011   Diastolic dysfunction 04/12/2011   SOB (shortness of breath) 04/12/2011   Diabetes mellitus type 2, uncontrolled, with complications 03/15/2011   HYPERTENSION, BENIGN 03/15/2011   DVT 03/15/2011   TACHYCARDIA 03/15/2011    ONSET DATE: 05/27/23  REFERRING DIAG: R29.6 (ICD-10-CM) - Frequent falls   THERAPY DIAG:  Abnormality of gait and mobility  Difficulty in walking, not elsewhere classified  Muscle weakness (generalized)  Other abnormalities of gait and mobility  Unsteadiness on feet  Rationale for Evaluation and Treatment: Rehabilitation  SUBJECTIVE:                                                                                                                                                                                             SUBJECTIVE STATEMENT: Pt reports he can't keep his balance and he keeps falling. Pt reports he is unable to walk prolonged distances without his legs getting really tired. Pt MRI shows "air pocket" in one of his back fusions.  Pt had not hear anything  further regarding this. Pt reports the balance issues began in December of 2023. Prior to then he was walking to garage and completing hunting activities. All of the sudden he started falling and losing his balance. Pt reports his feet and legs hurt frequently from neuropathy and the neuropathy impacts his falling. Pt does not report dizziness per say but does feel like he is going to fall at these times.  Patient reports he is very motivated to improve and willing to work hard to improve his quality of life and mobility. Pt accompanied by: self and wife in waiting room  PERTINENT HISTORY: From 7/11 Visit with DR. Potter regarding his balance  "Patient is reporting with most recent fall around two weeks ago. Notes that his left leg gave out and fell. Denies any injuries. Previous fall was in 05/27/2023 after patient fell from his chair. Dizziness has been "off and on". Dizziness occurs randomly and occasionally with positional changes. Denies any nausea and vomitting. Performs daily balance exercise. Patient was not not able to complete EMG lowers due to swelling. Stumbling and tripping has increased. Visited [facility], exam was normal. Ongoing back pain. " Pt has history of DM, blind right eye, HTN, Neuropathy, HLD, Chronic Back Pain, OA of knees.    PAIN:  Are you having pain? Yes: NPRS scale: 5/10 Pain location: low back  Pain description: sharp Aggravating factors: everything but he will try anything and let us know if it worsens it  Relieving factors:   Pain location: L Knee 6/10 NPRS  Pain description: dull and achy Aggravating factors: everything but he will try anything and let us know if it worsens it  Relieving factors:   PRECAUTIONS: Fall  RED FLAGS: None   WEIGHT BEARING RESTRICTIONS: No  FALLS: Has patient fallen in last 6 months? Yes. Number of falls 2  LIVING ENVIRONMENT: Lives with: lives with their family Lives in: House/apartment Stairs: Yes: External: 2 steps; none Has  following equipment at home: Single point cane  PLOF: Independent  PATIENT GOALS: to walk and move around. Pt used to walk all over [place] in the evenings.   OBJECTIVE:   DIAGNOSTIC FINDINGS: n/a  COGNITION: Overall cognitive status: Within functional limits for tasks assessed   SENSATION: Not tested   POSTURE: weight shift left     LOWER EXTREMITY MMT:    MMT Right Eval Left Eval  Hip flexion 3+ 3+  Hip extension    Hip abduction 4+ 4+  Hip adduction 4+ 4+  Hip internal rotation    Hip external rotation    Knee flexion 4 4  Knee extension 4+ 4+  Ankle dorsiflexion 4+ 4+  Ankle plantarflexion    Ankle inversion    Ankle eversion    (Blank rows = not tested)  BED MOBILITY:  Pt reports difficulty with getting out of bed and has trouble keeping his balance. Has to take a second to prevent severe LOB.   TRANSFERS: Assistive device utilized: None  Sit to stand: Complete Independence Stand to sit: Complete Independence Chair to chair: Complete Independence Floor:  Not tested  GAIT: Gait pattern: decreased step length- Right, decreased step length- Left, decreased stance time- Right, decreased stance time- Left, decreased stride length, decreased hip/knee flexion- Right, decreased hip/knee flexion- Left, and lateral lean- Left Distance walked: 70 feet Assistive device utilized: None Level of assistance: SBA Comments: See 10 m walk test results for gait speed.  Patient gait speed significantly below  age-matched norms placing him at increased risk for falls.  Patient also has significant trunk lean to the left showing left shoulder significantly depressed compared to right shoulder with gait.  FUNCTIONAL TESTS:  Five times Sit to Stand Test (FTSS)  TIME: 23.64 sec  Cut off scores indicative of increased fall risk: >12 sec CVA, >16 sec PD, >13 sec vestibular (ANPTA Core Set of Outcome Measures for Adults with Neurologic Conditions, 2018)  PT instructed pt in TUG:  18.94 sec ( >13.5 sec indicates increased fall risk)  10 Meter Walk Test: Patient instructed to walk 10 meters (32.8 ft) as quickly and as safely as possible at their normal speed x2 ramp-down.  Normal speed 1: .55 m/s Normal speed 2: .50 m/s Average Normal speed: .525 m/s  Cut off scores: <0.4 m/s = household Ambulator, 0.4-0.8 m/s = limited community Ambulator, >0.8 m/s = community Ambulator, >1.2 m/s = crossing a street, <1.0 = increased fall risk MCID 0.05 m/s (small), 0.13 m/s (moderate) (ANPTA Core Set of Outcome Measures for Adults with Neurologic Conditions, 2018)  Patient demonstrates increased fall risk as noted by score of   /56 on Berg Balance Scale.  (<36= high risk for falls, close to 100%; 37-45 significant >80%; 46-51 moderate >50%; 52-55 lower >25%)   PATIENT SURVEYS:  FOTO 40 goal of 49  TODAY'S TREATMENT:                                                                                                                              Eval only    PATIENT EDUCATION: Education details: POC  Person educated: Patient Education method: Explanation Education comprehension: verbalized understanding  HOME EXERCISE PROGRAM: Establish visit 2  GOALS: Goals reviewed with patient? Yes  SHORT TERM GOALS: Target date: 09/07/2023      Patient will be independent in home exercise program to improve strength/mobility for better functional independence with ADLs. Baseline: No HEP currently  Goal status: INITIAL   LONG TERM GOALS: Target date: 11/02/2023   1.  Patient (> 6 years old) will complete five times sit to stand test in < 15 seconds indicating an increased LE strength and improved balance. Baseline: 23.64 Goal status: INITIAL  2.  Patient will increase FOTO score to equal to or greater than  49   to demonstrate statistically significant improvement in mobility and quality of life.  Baseline: 40 Goal status: INITIAL   3.  Patient will increase Berg Balance  score by > 6 points to demonstrate decreased fall risk during functional activities. Baseline: Test visit two Goal status: INITIAL   4.   Patient will reduce timed up and go to <11 seconds to reduce fall risk and demonstrate improved transfer/gait ability. Baseline: 18.94 no AD Goal status: INITIAL  5.   Patient will increase 10 meter walk test to >1.77m/s as to improve gait speed for better community ambulation and to reduce fall risk. Baseline: .525 m/s Goal status: INITIAL  ASSESSMENT:  CLINICAL IMPRESSION: Patient is a 72 year old male who presents to physical therapy for evaluation and treatment for balance disorder resulting in many falls.  Patient presents with overall weakness in his lower extremities as evidenced by 5 times sit to stand as well as manual muscle testing.  Patient also presents with impaired gait mechanics as well as impaired gait speed both impacting his quality of life and showing increased risk of falls.  In addition patient demonstrate increased risk of falls though as evidenced by timed up and go result.  Patient will be tested and Berg balance scale for further assessment of balance neck session as well as provided with home exercise program in order to challenge his lower extremity strength and improve his balance and endurance.  Will benefit of skilled physical therapy interventions in order to improve his balance, reduce his risk of falls, improve his lower extremity strength for improved function with activities of daily living, and to overall improve his quality of life.  OBJECTIVE IMPAIRMENTS: Abnormal gait, decreased activity tolerance, decreased balance, decreased endurance, decreased mobility, difficulty walking, decreased strength, and postural dysfunction.   ACTIVITY LIMITATIONS: standing, squatting, stairs, transfers, bed mobility, and locomotion level  PARTICIPATION LIMITATIONS: cleaning, shopping, community activity, and yard work  PERSONAL  FACTORS: Age and 3+ comorbidities: Diabetes mellitus, blind in right eye, hypertension, neuropathy, hyperlipidemia, chronic back pain, osteoarthritis in both knees  are also affecting patient's functional outcome.   REHAB POTENTIAL: Good  CLINICAL DECISION MAKING: Stable/uncomplicated  EVALUATION COMPLEXITY: Low  PLAN:  PT FREQUENCY: 2x/week  PT DURATION: 12 weeks  PLANNED INTERVENTIONS: Therapeutic exercises, Therapeutic activity, Neuromuscular re-education, Balance training, Gait training, Patient/Family education, Self Care, Joint mobilization, Stair training, and Manual therapy  PLAN FOR NEXT SESSION: BERG, HEP, Endurance and balance training, monitor low back and knee pain with intervention    Norman Herrlich, PT 08/10/2023, 4:44 PM

## 2023-08-12 ENCOUNTER — Ambulatory Visit: Payer: Medicare Other | Admitting: Podiatry

## 2023-08-12 DIAGNOSIS — M79674 Pain in right toe(s): Secondary | ICD-10-CM

## 2023-08-12 DIAGNOSIS — M79675 Pain in left toe(s): Secondary | ICD-10-CM | POA: Diagnosis not present

## 2023-08-12 DIAGNOSIS — B351 Tinea unguium: Secondary | ICD-10-CM

## 2023-08-12 DIAGNOSIS — E1142 Type 2 diabetes mellitus with diabetic polyneuropathy: Secondary | ICD-10-CM

## 2023-08-12 DIAGNOSIS — I739 Peripheral vascular disease, unspecified: Secondary | ICD-10-CM

## 2023-08-12 MED ORDER — MELOXICAM 7.5 MG PO TABS: 7.5000 mg | ORAL_TABLET | Freq: Every day | ORAL | 0 refills | Status: DC

## 2023-08-12 NOTE — Addendum Note (Signed)
Addended by: Carlena Hurl F on: 08/12/2023 11:09 AM   Modules accepted: Orders

## 2023-08-12 NOTE — Progress Notes (Signed)
  Subjective:  Patient ID: Jerry Parsons, male    DOB: February 25, 1951,  MRN: [id number]  Chief Complaint  Patient presents with   Nail Problem    Room 21   Type 2 with DM neuropathy.  Coagulation defect taking eliquis.    72 y.o. male presents with the above complaint. History confirmed with patient. Patient presenting with pain related to dystrophic thickened elongated nails. Patient is unable to trim own nails related to nail dystrophy and/or mobility issues. 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   Objective:  Physical Exam: warm, good capillary refill nail exam onychomycosis of the toenails, onycholysis, and dystrophic nails DP pulses palpable, PT pulses palpable, and protective sensation absent Left Foot:  Pain with palpation of nails due to elongation and dystrophic growth.  Right Foot: Pain with palpation of nails due to elongation and dystrophic growth.   Assessment:   1. Pain due to onychomycosis of toenails of both feet   2. Diabetic polyneuropathy associated with type 2 diabetes mellitus (HCC)   3. PAD (peripheral artery disease) (HCC)         Plan:  Patient was evaluated and treated and all questions answered.  #Onychomycosis with pain  -Nails palliatively debrided as below. -Educated on self-care  Procedure: Nail Debridement Rationale: Pain Type of Debridement: manual, sharp debridement. Instrumentation: Nail nipper, rotary burr. Number of Nails: 10  Return in about 3 months (around 11/12/2023) for [facility].         Corinna Gab, DPM [facility] / [facility]

## 2023-08-15 ENCOUNTER — Ambulatory Visit: Payer: Medicare Other | Admitting: Physical Therapy

## 2023-08-15 ENCOUNTER — Encounter: Payer: Self-pay | Admitting: Physical Therapy

## 2023-08-15 DIAGNOSIS — R262 Difficulty in walking, not elsewhere classified: Secondary | ICD-10-CM

## 2023-08-15 DIAGNOSIS — R269 Unspecified abnormalities of gait and mobility: Secondary | ICD-10-CM

## 2023-08-15 DIAGNOSIS — R2689 Other abnormalities of gait and mobility: Secondary | ICD-10-CM

## 2023-08-15 DIAGNOSIS — M6281 Muscle weakness (generalized): Secondary | ICD-10-CM | POA: Diagnosis not present

## 2023-08-15 DIAGNOSIS — R2681 Unsteadiness on feet: Secondary | ICD-10-CM | POA: Diagnosis not present

## 2023-08-15 NOTE — Therapy (Signed)
OUTPATIENT PHYSICAL THERAPY NEURO TREATMENT   Patient Name: Jerry Parsons MRN: [id number] DOB:May 25, 1951, 72 y.o., male Today's Date: 08/15/2023   PCP:   Bosie Clos, MD   REFERRING PROVIDER:   Bosie Clos, MD    END OF SESSION:  PT End of Session - 08/15/23 1600     Visit Number 2    Number of Visits 24    Date for PT Re-Evaluation 11/02/23    Progress Note Due on Visit 10    PT Start Time 1603    PT Stop Time 1643    PT Time Calculation (min) 40 min    Equipment Utilized During Treatment Gait belt    Activity Tolerance Patient tolerated treatment well    Behavior During Therapy WFL for tasks assessed/performed              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    x2   GERD (gastroesophageal reflux disease)    Headache    h/o as a child   Hypercholesteremia    Hypertension    Kidney failure    acute   Neuromuscular disorder (HCC)    Neuropathy    Pneumonia    time 9 ;last episode 12/2015   Pulmonary embolus (HCC) 2011   Seasonal allergies    Seizures (HCC)    as child    Sleep apnea    BIPAP   Stiff neck    limited turning s/p titanium plate placement   TIA (transient ischemic attack)    Wears dentures    full upper and lower   Past Surgical History:  Procedure Laterality Date   BACK SURGERY     x 8; upper x 3 & lower x 5   CARDIOVERSION  03/14/13, 10/16   2014 - [facility], 2016 - [facility]   CARDIOVERSION N/A 09/11/2020   Procedure: CARDIOVERSION;  Surgeon: Antonieta Iba, MD;  Location: [facility];  Service: Cardiovascular;  Laterality: N/A;   CATARACT EXTRACTION W/PHACO Left 10/29/2015   Procedure: CATARACT EXTRACTION PHACO AND INTRAOCULAR  LENS PLACEMENT (IOC);  Surgeon: Lockie Mola, MD;  Location: [facility];  Service: Ophthalmology;  Laterality: Left;  DIABETIC - insulin and oral medsSleep apnea - no machine   CATARACT EXTRACTION W/PHACO Right 02/16/2023   Procedure: CATARACT EXTRACTION PHACO AND INTRAOCULAR LENS PLACEMENT (IOC) RIGHT DIABETIC MALYUGIN HEALON 5 VISION BLUE  65.52  03:57.7;  Surgeon: Lockie Mola, MD;  Location: [facility];  Service: Ophthalmology;  Laterality: Right;  Diabetic   CHOLECYSTECTOMY     COLONOSCOPY WITH PROPOFOL N/A 01/16/2018   Procedure: COLONOSCOPY WITH PROPOFOL;  Surgeon: Scot Jun, MD;  Location: [facility];  Service: Endoscopy;  Laterality: N/A;   ESOPHAGOGASTRODUODENOSCOPY (EGD) WITH PROPOFOL N/A 01/16/2018   Procedure: ESOPHAGOGASTRODUODENOSCOPY (EGD) WITH PROPOFOL;  Surgeon: Scot Jun, MD;  Location: [facility];  Service: Endoscopy;  Laterality: N/A;   EYE SURGERY     GALLBLADDER SURGERY  2002   JOINT REPLACEMENT Right 2018   KNEE ARTHROSCOPY     left    PARATHYROIDECTOMY N/A 07/25/2020   Procedure: PARATHYROIDECTOMY;  Surgeon:  Duanne Guess, MD;  Location: [facility];  Service: General;  Laterality: N/A;   ROTATOR CUFF REPAIR  2001   left    TEE WITHOUT CARDIOVERSION N/A 04/26/2018   Procedure: TRANSESOPHAGEAL ECHOCARDIOGRAM (TEE);  Surgeon: Yvonne Kendall, MD;  Location: [facility];  Service: Cardiovascular;  Laterality: N/A;   TONSILLECTOMY     TOTAL KNEE ARTHROPLASTY Right 06/20/2017   Procedure: RIGHT TOTAL KNEE ARTHROPLASTY;  Surgeon: Ollen Gross, MD;  Location: [facility];  Service: Orthopedics;  Laterality: Right;   Patient Active Problem List   Diagnosis Date Noted   Olecranon bursitis of right elbow 12/29/2022   Type 2 diabetes mellitus with diabetic polyneuropathy, with long-term current use of insulin (HCC) 12/28/2022   Respiratory tract infection 12/17/2021   Aortic atherosclerosis (HCC) 10/09/2021   Carpal tunnel  syndrome of left wrist 09/01/2021   Acquired trigger finger of left middle finger 05/28/2021   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   Primary hyperparathyroidism (HCC)    Osteoarthritis of both knees 11/11/2018   OSA on CPAP 10/14/2018   Lumbar spondylosis 05/05/2018   Spinal stenosis of lumbar region 05/05/2018   Constipation, chronic 03/08/2018   Pain in limb 03/03/2018   Gastroesophageal reflux disease without esophagitis 01/04/2018   OA (osteoarthritis) of knee 06/20/2017   Sepsis due to pneumonia (HCC) 05/07/2017   Neuropathy 10/13/2016   Amblyopia 12/30/2015   Cornea scar 12/30/2015   NS (nuclear sclerosis) 12/30/2015   Pseudoaphakia 12/30/2015   Dehydration 09/08/2015   Aspiration pneumonia (HCC) 07/04/2015   Paroxysmal atrial fibrillation (HCC) 07/04/2015   Arthritis of knee, degenerative 05/28/2015   Chronic back pain 09/17/2014   Leg edema 05/07/2014   Chronic diastolic CHF (congestive heart failure) (HCC) 05/07/2014   Campylobacter diarrhea 04/25/2013   Atrial flutter (HCC) 01/23/2013   Obesity 05/19/2012   Hyperlipidemia 11/11/2011   Diastolic dysfunction 04/12/2011   SOB (shortness of breath) 04/12/2011   Diabetes mellitus type 2, uncontrolled, with complications 03/15/2011   HYPERTENSION, BENIGN 03/15/2011   DVT 03/15/2011   TACHYCARDIA 03/15/2011    ONSET DATE: 05/27/23  REFERRING DIAG: R29.6 (ICD-10-CM) - Frequent falls   THERAPY DIAG:  Abnormality of gait and mobility  Difficulty in walking, not elsewhere classified  Other abnormalities of gait and mobility  Unsteadiness on feet  Rationale for Evaluation and Treatment: Rehabilitation  SUBJECTIVE:                                                                                                                                                                                             SUBJECTIVE STATEMENT:  Pt reports he can't keep his balance and he keeps falling.  Pt reports he is unable to walk  prolonged distances without his legs getting really tired. Pt MRI shows "air pocket" in one of his back fusions. Pt had not hear anything further regarding this. Pt reports the balance issues began in December of 2023. Prior to then he was walking to garage and completing hunting activities. All of the sudden he started falling and losing his balance. Pt reports his feet and legs hurt frequently from neuropathy and the neuropathy impacts his falling. Pt does not report dizziness per say but does feel like he is going to fall at these times.  Patient reports he is very motivated to improve and willing to work hard to improve his quality of life and mobility.  Pt accompanied by: self and wife in waiting room  PERTINENT HISTORY: From 7/11 Visit with DR. Potter regarding his balance  "Patient is reporting with most recent fall around two weeks ago. Notes that his left leg gave out and fell. Denies any injuries. Previous fall was in 05/27/2023 after patient fell from his chair. Dizziness has been "off and on". Dizziness occurs randomly and occasionally with positional changes. Denies any nausea and vomitting. Performs daily balance exercise. Patient was not not able to complete EMG lowers due to swelling. Stumbling and tripping has increased. Visited [facility], exam was normal. Ongoing back pain. " Pt has history of DM, blind right eye, HTN, Neuropathy, HLD, Chronic Back Pain, OA of knees.    PAIN:  Are you having pain? Yes: NPRS scale: 5/10 Pain location: low back  Pain description: sharp Aggravating factors: everything but he will try anything and let us know if it worsens it  Relieving factors:   Pain location: L Knee 6/10 NPRS  Pain description: dull and achy Aggravating factors: everything but he will try anything and let us know if it worsens it  Relieving factors:   PRECAUTIONS: Fall  RED FLAGS: None   WEIGHT BEARING RESTRICTIONS: No  FALLS: Has patient fallen in last 6 months? Yes. Number of  falls 2  LIVING ENVIRONMENT: Lives with: lives with their family Lives in: House/apartment Stairs: Yes: External: 2 steps; none Has following equipment at home: Single point cane  PLOF: Independent  PATIENT GOALS: to walk and move around. Pt used to walk all over [place] in the evenings.   OBJECTIVE: (objective measures completed at initial evaluation unless otherwise dated) 3  DIAGNOSTIC FINDINGS: n/a  COGNITION: Overall cognitive status: Within functional limits for tasks assessed   SENSATION: Not tested   POSTURE: weight shift left     LOWER EXTREMITY MMT:    MMT Right Eval Left Eval  Hip flexion 3+ 3+  Hip extension    Hip abduction 4+ 4+  Hip adduction 4+ 4+  Hip internal rotation    Hip external rotation    Knee flexion 4 4  Knee extension 4+ 4+  Ankle dorsiflexion 4+ 4+  Ankle plantarflexion    Ankle inversion    Ankle eversion    (Blank rows = not tested)  BED MOBILITY:  Pt reports difficulty with getting out of bed and has trouble keeping his balance. Has to take a second to prevent severe LOB.   TRANSFERS: Assistive device utilized: None  Sit to stand: Complete Independence Stand to sit: Complete Independence Chair to chair: Complete Independence Floor:  Not tested  GAIT: Gait pattern: decreased step length- Right, decreased step length- Left, decreased stance time- Right, decreased stance time- Left, decreased stride length, decreased hip/knee flexion- Right,  decreased hip/knee flexion- Left, and lateral lean- Left Distance walked: 70 feet Assistive device utilized: None Level of assistance: SBA Comments: See 10 m walk test results for gait speed.  Patient gait speed significantly below age-matched norms placing him at increased risk for falls.  Patient also has significant trunk lean to the left showing left shoulder significantly depressed compared to right shoulder with gait.  FUNCTIONAL TESTS:  Five times Sit to Stand Test (FTSS)  TIME:  23.64 sec  Cut off scores indicative of increased fall risk: >12 sec CVA, >16 sec PD, >13 sec vestibular (ANPTA Core Set of Outcome Measures for Adults with Neurologic Conditions, 2018)  PT instructed pt in TUG: 18.94 sec ( >13.5 sec indicates increased fall risk)  10 Meter Walk Test: Patient instructed to walk 10 meters (32.8 ft) as quickly and as safely as possible at their normal speed x2 ramp-down.  Normal speed 1: .55 m/s Normal speed 2: .50 m/s Average Normal speed: .525 m/s  Cut off scores: <0.4 m/s = household Ambulator, 0.4-0.8 m/s = limited community Ambulator, >0.8 m/s = community Ambulator, >1.2 m/s = crossing a street, <1.0 = increased fall risk MCID 0.05 m/s (small), 0.13 m/s (moderate) (ANPTA Core Set of Outcome Measures for Adults with Neurologic Conditions, 2018)  Patient demonstrates increased fall risk as noted by score of   /56 on Berg Balance Scale.  (<36= high risk for falls, close to 100%; 37-45 significant >80%; 46-51 moderate >50%; 52-55 lower >25%)   PATIENT SURVEYS:  FOTO 40 goal of 49  TODAY'S TREATMENT:     TE                                                                                                                           [facility] PT Assessment - 08/15/23 0001       Standardized Balance Assessment   Standardized Balance Assessment Berg Balance Test      Berg Balance Test   Sit to Stand Able to stand  independently using hands    Standing Unsupported Able to stand safely 2 minutes    Sitting with Back Unsupported but Feet Supported on Floor or Stool Able to sit safely and securely 2 minutes    Stand to Sit Controls descent by using hands    Transfers Able to transfer safely, definite need of hands    Standing Unsupported with Eyes Closed Able to stand 10 seconds with supervision    Standing Unsupported with Feet Together Able to place feet together independently and stand for 1 minute with supervision    From Standing, Reach Forward with  Outstretched Arm Can reach forward >5 cm safely (2")    From Standing Position, Pick up Object from Floor Able to pick up shoe, needs supervision    From Standing Position, Turn to Look Behind Over each Shoulder Turn sideways only but maintains balance    Turn 360 Degrees Able to turn 360 degrees safely but slowly  Standing Unsupported, Alternately Place Feet on Step/Stool Able to complete 4 steps without aid or supervision    Standing Unsupported, One Foot in Front Able to take small step independently and hold 30 seconds    Standing on One Leg Tries to lift leg/unable to hold 3 seconds but remains standing independently    Total Score 37            HEP practice and instruction, Pt educated throughout session about proper posture and technique with exercises. Improved exercise technique, movement at target joints, use of target muscles after min to mod verbal, visual, tactile cues.  Standing marching with support 2 x 10  Seated hip abduction with GTB x 10 reps  Standing tandem balance with UE support 2 x 30 seconds ea side  Sit to stand with UE support on surface 2 x 6 reps   *2 x 10 heel raises, but caused low back pain so did not add to HEP   Nustep level 1 x 2.5 min x 2 rounds.    PATIENT EDUCATION: Education details: POC  Person educated: Patient Education method: Explanation Education comprehension: verbalized understanding  HOME EXERCISE PROGRAM: Access Code: [email or web address] URL: [email or web address] Date: 08/15/2023 Prepared by: Thresa Ross  Exercises - Standing March with Counter Support  - 1 x daily - 7 x weekly - 2 sets - 10 reps - Seated hip abduction with band  - 1 x daily - 7 x weekly - 2 sets - 10 reps - Standing Tandem Balance with Counter Support  - 1 x daily - 7 x weekly - 2 sets - 30 second hold - Sit to Stand  - 1 x daily - 7 x weekly - 2 sets - 6 reps  GOALS: Goals reviewed with patient? Yes  SHORT TERM GOALS: Target date:  09/07/2023      Patient will be independent in home exercise program to improve strength/mobility for better functional independence with ADLs. Baseline: No HEP currently  Goal status: INITIAL   LONG TERM GOALS: Target date: 11/02/2023   1.  Patient (> 52 years old) will complete five times sit to stand test in < 15 seconds indicating an increased LE strength and improved balance. Baseline: 23.64 Goal status: INITIAL  2.  Patient will increase FOTO score to equal to or greater than  49   to demonstrate statistically significant improvement in mobility and quality of life.  Baseline: 40 Goal status: INITIAL   3.  Patient will increase Berg Balance score by > 6 points to demonstrate decreased fall risk during functional activities. Baseline: 8/19: 37 Goal status: INITIAL   4.   Patient will reduce timed up and go to <11 seconds to reduce fall risk and demonstrate improved transfer/gait ability. Baseline: 18.94 no AD Goal status: INITIAL  5.   Patient will increase 10 meter walk test to >1.44m/s as to improve gait speed for better community ambulation and to reduce fall risk. Baseline: .525 m/s Goal status: INITIAL     ASSESSMENT:  CLINICAL IMPRESSION: Pt presents to PT with good motivation for completion of PT activities. Pt demonstrated significant impairments with BERG balance test showing high risk for falls. Pt introduced to initial and tolerated all exercises well with exception of heel raises which caused low back pain in seated and standing position. Pt shows high fatigability on nustep using just his Les, may consider UE utilization for this activity next session. Pt will continue to benefit from skilled physical therapy intervention to address impairments,  improve QOL, and attain therapy goals.    OBJECTIVE IMPAIRMENTS: Abnormal gait, decreased activity tolerance, decreased balance, decreased endurance, decreased mobility, difficulty walking, decreased strength, and  postural dysfunction.   ACTIVITY LIMITATIONS: standing, squatting, stairs, transfers, bed mobility, and locomotion level  PARTICIPATION LIMITATIONS: cleaning, shopping, community activity, and yard work  PERSONAL FACTORS: Age and 3+ comorbidities: Diabetes mellitus, blind in right eye, hypertension, neuropathy, hyperlipidemia, chronic back pain, osteoarthritis in both knees  are also affecting patient's functional outcome.   REHAB POTENTIAL: Good  CLINICAL DECISION MAKING: Stable/uncomplicated  EVALUATION COMPLEXITY: Low  PLAN:  PT FREQUENCY: 2x/week  PT DURATION: 12 weeks  PLANNED INTERVENTIONS: Therapeutic exercises, Therapeutic activity, Neuromuscular re-education, Balance training, Gait training, Patient/Family education, Self Care, Joint mobilization, Stair training, and Manual therapy  PLAN FOR NEXT SESSION: BERG, HEP, Endurance and balance training, monitor low back and knee pain with intervention    Norman Herrlich, PT 08/15/2023, 4:26 PM

## 2023-08-17 ENCOUNTER — Ambulatory Visit: Payer: Medicare Other | Admitting: Physical Therapy

## 2023-08-17 ENCOUNTER — Encounter: Payer: Self-pay | Admitting: Physical Therapy

## 2023-08-17 DIAGNOSIS — R269 Unspecified abnormalities of gait and mobility: Secondary | ICD-10-CM | POA: Diagnosis not present

## 2023-08-17 DIAGNOSIS — R262 Difficulty in walking, not elsewhere classified: Secondary | ICD-10-CM | POA: Diagnosis not present

## 2023-08-17 DIAGNOSIS — M6281 Muscle weakness (generalized): Secondary | ICD-10-CM

## 2023-08-17 DIAGNOSIS — R2681 Unsteadiness on feet: Secondary | ICD-10-CM | POA: Diagnosis not present

## 2023-08-17 DIAGNOSIS — R2689 Other abnormalities of gait and mobility: Secondary | ICD-10-CM | POA: Diagnosis not present

## 2023-08-17 NOTE — Therapy (Signed)
OUTPATIENT PHYSICAL THERAPY NEURO TREATMENT   Patient Name: Jerry Parsons MRN: [id number] DOB:Apr 29, 1951, 72 y.o., male Today's Date: 08/17/2023   PCP:   Bosie Clos, MD   REFERRING PROVIDER:   Bosie Clos, MD    END OF SESSION:  PT End of Session - 08/17/23 1544     Visit Number 3    Number of Visits 24    Date for PT Re-Evaluation 11/02/23    Progress Note Due on Visit 10    PT Start Time 1333    PT Stop Time 1414    PT Time Calculation (min) 41 min    Equipment Utilized During Treatment Gait belt    Activity Tolerance Patient tolerated treatment well    Behavior During Therapy WFL for tasks assessed/performed               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    x2   GERD (gastroesophageal reflux disease)    Headache    h/o as a child   Hypercholesteremia    Hypertension    Kidney failure    acute   Neuromuscular disorder (HCC)    Neuropathy    Pneumonia    time 9 ;last episode 12/2015   Pulmonary embolus (HCC) 2011   Seasonal allergies    Seizures (HCC)    as child    Sleep apnea    BIPAP   Stiff neck    limited turning s/p titanium plate placement   TIA (transient ischemic attack)    Wears dentures    full upper and lower   Past Surgical History:  Procedure Laterality Date   BACK SURGERY     x 8; upper x 3 & lower x 5   CARDIOVERSION  03/14/13, 10/16   2014 - [facility], 2016 - [place]   CARDIOVERSION N/A 09/11/2020   Procedure: CARDIOVERSION;  Surgeon: Antonieta Iba, MD;  Location: [facility] ORS;  Service: Cardiovascular;  Laterality: N/A;   CATARACT EXTRACTION W/PHACO Left 10/29/2015   Procedure: CATARACT EXTRACTION PHACO AND  INTRAOCULAR LENS PLACEMENT (IOC);  Surgeon: Lockie Mola, MD;  Location: [facility];  Service: Ophthalmology;  Laterality: Left;  DIABETIC - insulin and oral medsSleep apnea - no machine   CATARACT EXTRACTION W/PHACO Right 02/16/2023   Procedure: CATARACT EXTRACTION PHACO AND INTRAOCULAR LENS PLACEMENT (IOC) RIGHT DIABETIC MALYUGIN HEALON 5 VISION BLUE  65.52  03:57.7;  Surgeon: Lockie Mola, MD;  Location: [facility];  Service: Ophthalmology;  Laterality: Right;  Diabetic   CHOLECYSTECTOMY     COLONOSCOPY WITH PROPOFOL N/A 01/16/2018   Procedure: COLONOSCOPY WITH PROPOFOL;  Surgeon: Scot Jun, MD;  Location: [facility] ENDOSCOPY;  Service: Endoscopy;  Laterality: N/A;   ESOPHAGOGASTRODUODENOSCOPY (EGD) WITH PROPOFOL N/A 01/16/2018   Procedure: ESOPHAGOGASTRODUODENOSCOPY (EGD) WITH PROPOFOL;  Surgeon: Scot Jun, MD;  Location: [facility] ENDOSCOPY;  Service: Endoscopy;  Laterality: N/A;   EYE SURGERY     GALLBLADDER SURGERY  2002   JOINT REPLACEMENT Right 2018   KNEE ARTHROSCOPY     left    PARATHYROIDECTOMY N/A 07/25/2020   Procedure: PARATHYROIDECTOMY;  Surgeon: Duanne Guess, MD;  Location: [facility] ORS;  Service: General;  Laterality: N/A;   ROTATOR CUFF REPAIR  2001   left    TEE WITHOUT CARDIOVERSION N/A 04/26/2018   Procedure: TRANSESOPHAGEAL ECHOCARDIOGRAM (TEE);  Surgeon: Yvonne Kendall, MD;  Location: [facility] ORS;  Service: Cardiovascular;  Laterality: N/A;   TONSILLECTOMY     TOTAL KNEE ARTHROPLASTY Right 06/20/2017   Procedure: RIGHT TOTAL KNEE ARTHROPLASTY;  Surgeon: Ollen Gross, MD;  Location: [facility] OR[facility];  Service: Orthopedics;  Laterality: Right;   Patient Active Problem List   Diagnosis Date Noted   Olecranon bursitis of right elbow 12/29/2022   Type 2 diabetes mellitus with diabetic polyneuropathy, with long-term current use of insulin (HCC) 12/28/2022   Respiratory tract infection 12/17/2021   Aortic atherosclerosis (HCC) 10/09/2021   Carpal  tunnel syndrome of left wrist 09/01/2021   Acquired trigger finger of left middle finger 05/28/2021   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   Primary hyperparathyroidism (HCC)    Osteoarthritis of both knees 11/11/2018   OSA on CPAP 10/14/2018   Lumbar spondylosis 05/05/2018   Spinal stenosis of lumbar region 05/05/2018   Constipation, chronic 03/08/2018   Pain in limb 03/03/2018   Gastroesophageal reflux disease without esophagitis 01/04/2018   OA (osteoarthritis) of knee 06/20/2017   Sepsis due to pneumonia (HCC) 05/07/2017   Neuropathy 10/13/2016   Amblyopia 12/30/2015   Cornea scar 12/30/2015   NS (nuclear sclerosis) 12/30/2015   Pseudoaphakia 12/30/2015   Dehydration 09/08/2015   Aspiration pneumonia (HCC) 07/04/2015   Paroxysmal atrial fibrillation (HCC) 07/04/2015   Arthritis of knee, degenerative 05/28/2015   Chronic back pain 09/17/2014   Leg edema 05/07/2014   Chronic diastolic CHF (congestive heart failure) (HCC) 05/07/2014   Campylobacter diarrhea 04/25/2013   Atrial flutter (HCC) 01/23/2013   Obesity 05/19/2012   Hyperlipidemia 11/11/2011   Diastolic dysfunction 04/12/2011   SOB (shortness of breath) 04/12/2011   Diabetes mellitus type 2, uncontrolled, with complications 03/15/2011   HYPERTENSION, BENIGN 03/15/2011   DVT 03/15/2011   TACHYCARDIA 03/15/2011    ONSET DATE: 05/27/23  REFERRING DIAG: R29.6 (ICD-10-CM) - Frequent falls   THERAPY DIAG:  Abnormality of gait and mobility  Difficulty in walking, not elsewhere classified  Other abnormalities of gait and mobility  Unsteadiness on feet  Muscle weakness (generalized)  Rationale for Evaluation and Treatment: Rehabilitation  SUBJECTIVE:                                                                                                                                                                                             SUBJECTIVE STATEMENT:  Pt reports no falls since last  session. Pt had some  soreness/ tightness in his Les following last session.   Pt accompanied by: self and wife in waiting room  PERTINENT HISTORY: From 7/11 Visit with DR. Potter regarding his balance  "Patient is reporting with most recent fall around two weeks ago. Notes that his left leg gave out and fell. Denies any injuries. Previous fall was in 05/27/2023 after patient fell from his chair. Dizziness has been "off and on". Dizziness occurs randomly and occasionally with positional changes. Denies any nausea and vomitting. Performs daily balance exercise. Patient was not not able to complete EMG lowers due to swelling. Stumbling and tripping has increased. Visited [facility], exam was normal. Ongoing back pain. " Pt has history of DM, blind right eye, HTN, Neuropathy, HLD, Chronic Back Pain, OA of knees.    PAIN:  Are you having pain? Yes: NPRS scale: 5/10 Pain location: low back  Pain description: sharp Aggravating factors: everything but he will try anything and let us know if it worsens it  Relieving factors:   Pain location: L Knee 6/10 NPRS  Pain description: dull and achy Aggravating factors: everything but he will try anything and let us know if it worsens it  Relieving factors:   PRECAUTIONS: Fall  RED FLAGS: None   WEIGHT BEARING RESTRICTIONS: No  FALLS: Has patient fallen in last 6 months? Yes. Number of falls 2  LIVING ENVIRONMENT: Lives with: lives with their family Lives in: House/apartment Stairs: Yes: External: 2 steps; none Has following equipment at home: Single point cane  PLOF: Independent  PATIENT GOALS: to walk and move around. Pt used to walk all over [place] in the evenings.   OBJECTIVE: (objective measures completed at initial evaluation unless otherwise dated) 3  DIAGNOSTIC FINDINGS: n/a  COGNITION: Overall cognitive status: Within functional limits for tasks assessed   SENSATION: Not tested   POSTURE: weight shift left     LOWER EXTREMITY MMT:    MMT  Right Eval Left Eval  Hip flexion 3+ 3+  Hip extension    Hip abduction 4+ 4+  Hip adduction 4+ 4+  Hip internal rotation    Hip external rotation    Knee flexion 4 4  Knee extension 4+ 4+  Ankle dorsiflexion 4+ 4+  Ankle plantarflexion    Ankle inversion    Ankle eversion    (Blank rows = not tested)  BED MOBILITY:  Pt reports difficulty with getting out of bed and has trouble keeping his balance. Has to take a second to prevent severe LOB.   TRANSFERS: Assistive device utilized: None  Sit to stand: Complete Independence Stand to sit: Complete Independence Chair to chair: Complete Independence Floor:  Not tested  GAIT: Gait pattern: decreased step length- Right, decreased step length- Left, decreased stance time- Right, decreased stance time- Left, decreased stride length, decreased hip/knee flexion- Right, decreased hip/knee flexion- Left, and lateral lean- Left Distance walked: 70 feet Assistive device utilized: None Level of assistance: SBA Comments: See 10 m walk test results for gait speed.  Patient gait speed significantly below age-matched norms placing him at increased risk for falls.  Patient also has significant trunk lean to the left showing left shoulder significantly depressed compared to right shoulder with gait.  FUNCTIONAL TESTS:  Five times Sit to Stand Test (FTSS)  TIME: 23.64 sec  Cut off scores indicative of increased fall risk: >12 sec CVA, >16 sec PD, >13 sec vestibular (ANPTA Core Set of Outcome Measures for Adults with Neurologic Conditions, 2018)  PT instructed pt in TUG: 18.94 sec ( >13.5 sec indicates increased fall risk)  10 Meter Walk Test: Patient instructed to walk 10 meters (32.8 ft) as quickly and as safely as possible at their normal speed x2 ramp-down.  Normal speed 1: .55 m/s Normal speed 2: .50 m/s Average Normal speed: .525 m/s  Cut off scores: <0.4 m/s = household Ambulator, 0.4-0.8 m/s = limited community Ambulator, >0.8 m/s =  community Ambulator, >1.2 m/s = crossing a street, <1.0 = increased fall risk MCID 0.05 m/s (small), 0.13 m/s (moderate) (ANPTA Core Set of Outcome Measures for Adults with Neurologic Conditions, 2018)  Patient demonstrates increased fall risk as noted by score of   /56 on Berg Balance Scale.  (<36= high risk for falls, close to 100%; 37-45 significant >80%; 46-51 moderate >50%; 52-55 lower >25%)   PATIENT SURVEYS:  FOTO 40 goal of 49  TODAY'S TREATMENT:        TE  Seated Hamstring curls  RTB 2 x 10 ea LE                                                                                            Seated march with RTB around feet 2 x 10 ea LE   NMR:  1 LE floor 1 on 6 in step  3 x 30 sec ea LE  Ambulation in // bars step on / over 6 in step and repeat x 6 reps total, UE assist throughout on // bars   Seated heel raises 2 x 12 reps   Ambulation training -without AD x 75 ft, AD x 25 ft  REST -SPC x 60 ft   Seated LAQ x 10 reps ea LE, high difficulty with this and pt reports tightness in [place]   PATIENT EDUCATION: Education details: POC  Person educated: Patient Education method: Explanation Education comprehension: verbalized understanding  HOME EXERCISE PROGRAM: Access Code: [email or web address] URL: [email or web address] Date: 08/15/2023 Prepared by: Thresa Ross  Exercises - Standing March with Counter Support  - 1 x daily - 7 x weekly - 2 sets - 10 reps - Seated hip abduction with band  - 1 x daily - 7 x weekly - 2 sets - 10 reps - Standing Tandem Balance with Counter Support  - 1 x daily - 7 x weekly - 2 sets - 30 second hold - Sit to Stand  - 1 x daily - 7 x weekly - 2 sets - 6 reps  GOALS: Goals reviewed with patient? Yes  SHORT TERM GOALS: Target date: 09/07/2023      Patient will be independent in home exercise program to improve strength/mobility for better functional independence with ADLs. Baseline: No HEP currently  Goal status:  INITIAL   LONG TERM GOALS: Target date: 11/02/2023   1.  Patient (> 55 years old) will complete five times sit to stand test in < 15 seconds indicating an increased LE strength and improved balance. Baseline: 23.64 Goal status: INITIAL  2.  Patient will increase FOTO score to equal to or greater than  49   to demonstrate statistically significant improvement in mobility and quality  of life.  Baseline: 40 Goal status: INITIAL   3.  Patient will increase Berg Balance score by > 6 points to demonstrate decreased fall risk during functional activities. Baseline: 8/19: 37 Goal status: INITIAL   4.   Patient will reduce timed up and go to <11 seconds to reduce fall risk and demonstrate improved transfer/gait ability. Baseline: 18.94 no AD Goal status: INITIAL  5.   Patient will increase 10 meter walk test to >1.107m/s as to improve gait speed for better community ambulation and to reduce fall risk. Baseline: .525 m/s Goal status: INITIAL     ASSESSMENT:  CLINICAL IMPRESSION:  Pt presents to PT with good motivation for completion of PT activities. Pt begins with treatment for his weakness and balance. Pt reports increased tightness in his Les throughout session likely secondary to fatigue. Will continue to monitor patient fatigue throughout session to prevent excessive tightness and discomfort following session.Pt will continue to benefit from skilled physical therapy intervention to address impairments, improve QOL, and attain therapy goals.     OBJECTIVE IMPAIRMENTS: Abnormal gait, decreased activity tolerance, decreased balance, decreased endurance, decreased mobility, difficulty walking, decreased strength, and postural dysfunction.   ACTIVITY LIMITATIONS: standing, squatting, stairs, transfers, bed mobility, and locomotion level  PARTICIPATION LIMITATIONS: cleaning, shopping, community activity, and yard work  PERSONAL FACTORS: Age and 3+ comorbidities: Diabetes mellitus,  blind in right eye, hypertension, neuropathy, hyperlipidemia, chronic back pain, osteoarthritis in both knees  are also affecting patient's functional outcome.   REHAB POTENTIAL: Good  CLINICAL DECISION MAKING: Stable/uncomplicated  EVALUATION COMPLEXITY: Low  PLAN:  PT FREQUENCY: 2x/week  PT DURATION: 12 weeks  PLANNED INTERVENTIONS: Therapeutic exercises, Therapeutic activity, Neuromuscular re-education, Balance training, Gait training, Patient/Family education, Self Care, Joint mobilization, Stair training, and Manual therapy  PLAN FOR NEXT SESSION:Endurance and balance training, monitor low back and knee pain with intervention    Norman Herrlich, PT 08/17/2023, 3:45 PM

## 2023-08-19 DIAGNOSIS — R1032 Left lower quadrant pain: Secondary | ICD-10-CM | POA: Diagnosis not present

## 2023-08-19 DIAGNOSIS — R1031 Right lower quadrant pain: Secondary | ICD-10-CM | POA: Diagnosis not present

## 2023-08-22 ENCOUNTER — Ambulatory Visit: Payer: Medicare Other

## 2023-08-22 ENCOUNTER — Telehealth: Payer: Self-pay

## 2023-08-22 NOTE — Telephone Encounter (Signed)
PT contacted pt via secure phone line due to no-show today. Pt reports he forgot he had an appointment. PT discussed with pt need to call and cancel appointment ahead of time for future appointments if he cannot make it.  Temple Pacini PT, DPT

## 2023-08-23 ENCOUNTER — Ambulatory Visit: Payer: Medicare Other | Admitting: Physical Therapy

## 2023-08-24 ENCOUNTER — Other Ambulatory Visit: Payer: Self-pay | Admitting: Nurse Practitioner

## 2023-08-24 ENCOUNTER — Ambulatory Visit: Payer: Medicare Other | Admitting: Physical Therapy

## 2023-08-24 DIAGNOSIS — K5909 Other constipation: Secondary | ICD-10-CM | POA: Diagnosis not present

## 2023-08-24 DIAGNOSIS — K439 Ventral hernia without obstruction or gangrene: Secondary | ICD-10-CM | POA: Diagnosis not present

## 2023-08-24 DIAGNOSIS — R1084 Generalized abdominal pain: Secondary | ICD-10-CM

## 2023-08-24 DIAGNOSIS — N5082 Scrotal pain: Secondary | ICD-10-CM

## 2023-08-24 DIAGNOSIS — K5903 Drug induced constipation: Secondary | ICD-10-CM

## 2023-08-25 ENCOUNTER — Ambulatory Visit
Admission: RE | Admit: 2023-08-25 | Discharge: 2023-08-25 | Disposition: A | Discharge status: Home / Self Care | Payer: Medicare Other | Source: Ambulatory Visit | Attending: Nurse Practitioner | Admitting: Nurse Practitioner

## 2023-08-25 DIAGNOSIS — R1084 Generalized abdominal pain: Secondary | ICD-10-CM

## 2023-08-25 DIAGNOSIS — K5909 Other constipation: Secondary | ICD-10-CM

## 2023-08-25 DIAGNOSIS — K439 Ventral hernia without obstruction or gangrene: Secondary | ICD-10-CM

## 2023-08-25 DIAGNOSIS — T402X5A Adverse effect of other opioids, initial encounter: Secondary | ICD-10-CM

## 2023-08-25 DIAGNOSIS — N5082 Scrotal pain: Secondary | ICD-10-CM

## 2023-08-25 MED ORDER — IOPAMIDOL (ISOVUE-300) INJECTION 61%
100.0000 mL | Freq: Once | INTRAVENOUS | Status: AC | PRN
  Administered 2023-08-25: 100 mL via INTRAVENOUS

## 2023-08-27 ENCOUNTER — Other Ambulatory Visit: Payer: Self-pay | Admitting: Podiatry

## 2023-08-27 ENCOUNTER — Other Ambulatory Visit: Payer: Self-pay | Admitting: Cardiovascular Disease

## 2023-08-27 ENCOUNTER — Other Ambulatory Visit: Payer: Self-pay | Admitting: Family Medicine

## 2023-08-27 DIAGNOSIS — G629 Polyneuropathy, unspecified: Secondary | ICD-10-CM

## 2023-08-30 ENCOUNTER — Ambulatory Visit: Payer: Medicare Other

## 2023-08-30 NOTE — Telephone Encounter (Signed)
Requested Prescriptions  Pending Prescriptions Disp Refills   gabapentin (NEURONTIN) 100 MG capsule [Pharmacy Med Name: GABAPENTIN 100 MG CAPSULE] 180 capsule 0    Sig: TAKE 1 CAPSULE BY MOUTH TWICE A DAY     Neurology: Anticonvulsants - gabapentin Failed - 08/27/2023 10:50 AM      Failed - Cr in normal range and within 360 days    Creat  Date Value Ref Range Status  12/13/2017 1.39 (H) 0.70 - 1.25 mg/dL Final    Comment:    For patients >73 years of age, the reference limit for Creatinine is approximately 13% higher for people identified as African-American. .    Creatinine, Ser  Date Value Ref Range Status  05/27/2023 1.88 (H) 0.61 - 1.24 mg/dL Final   Creatinine, Urine  Date Value Ref Range Status  06/24/2020 73 mg/dL Final         Passed - Completed PHQ-2 or PHQ-9 in the last 360 days      Passed - Valid encounter within last 12 months    Recent Outpatient Visits           8 months ago Type 2 diabetes mellitus with diabetic polyneuropathy, with long-term current use of insulin (HCC)   [facility] Simmons-Robinson, [place], MD   9 months ago Olecranon bursitis of right elbow   [facility] Malva Limes, MD   10 months ago Olecranon bursitis of right elbow   [facility] Malva Limes, MD   1 year ago Muscle strain of left forearm, initial encounter   [facility] Bosie Clos, MD   1 year ago HYPERTENSION, BENIGN    [facility] Bosie Clos, MD

## 2023-09-01 ENCOUNTER — Ambulatory Visit: Payer: Medicare Other

## 2023-09-05 ENCOUNTER — Ambulatory Visit: Payer: Medicare Other

## 2023-09-07 ENCOUNTER — Other Ambulatory Visit: Payer: Self-pay | Admitting: Nurse Practitioner

## 2023-09-07 ENCOUNTER — Ambulatory Visit: Payer: Medicare Other

## 2023-09-07 DIAGNOSIS — D696 Thrombocytopenia, unspecified: Secondary | ICD-10-CM | POA: Diagnosis not present

## 2023-09-07 DIAGNOSIS — N5082 Scrotal pain: Secondary | ICD-10-CM | POA: Diagnosis not present

## 2023-09-07 DIAGNOSIS — K5900 Constipation, unspecified: Secondary | ICD-10-CM | POA: Diagnosis not present

## 2023-09-07 DIAGNOSIS — R1084 Generalized abdominal pain: Secondary | ICD-10-CM | POA: Diagnosis not present

## 2023-09-07 DIAGNOSIS — R140 Abdominal distension (gaseous): Secondary | ICD-10-CM

## 2023-09-07 DIAGNOSIS — K219 Gastro-esophageal reflux disease without esophagitis: Secondary | ICD-10-CM | POA: Diagnosis not present

## 2023-09-07 DIAGNOSIS — K760 Fatty (change of) liver, not elsewhere classified: Secondary | ICD-10-CM | POA: Diagnosis not present

## 2023-09-07 DIAGNOSIS — D509 Iron deficiency anemia, unspecified: Secondary | ICD-10-CM | POA: Diagnosis not present

## 2023-09-07 DIAGNOSIS — Z86010 Personal history of colonic polyps: Secondary | ICD-10-CM | POA: Diagnosis not present

## 2023-09-08 DIAGNOSIS — K5909 Other constipation: Secondary | ICD-10-CM | POA: Diagnosis not present

## 2023-09-08 DIAGNOSIS — I10 Essential (primary) hypertension: Secondary | ICD-10-CM | POA: Diagnosis not present

## 2023-09-08 DIAGNOSIS — Z794 Long term (current) use of insulin: Secondary | ICD-10-CM | POA: Diagnosis not present

## 2023-09-08 DIAGNOSIS — E1142 Type 2 diabetes mellitus with diabetic polyneuropathy: Secondary | ICD-10-CM | POA: Diagnosis not present

## 2023-09-08 DIAGNOSIS — K219 Gastro-esophageal reflux disease without esophagitis: Secondary | ICD-10-CM | POA: Diagnosis not present

## 2023-09-12 ENCOUNTER — Ambulatory Visit: Payer: Medicare Other

## 2023-09-14 ENCOUNTER — Ambulatory Visit: Payer: Medicare Other

## 2023-09-19 ENCOUNTER — Ambulatory Visit: Payer: Medicare Other

## 2023-09-21 ENCOUNTER — Ambulatory Visit: Payer: Medicare Other

## 2023-09-26 ENCOUNTER — Ambulatory Visit: Payer: Medicare Other | Admitting: Physical Therapy

## 2023-09-28 ENCOUNTER — Ambulatory Visit (INDEPENDENT_AMBULATORY_CARE_PROVIDER_SITE_OTHER): Payer: Medicare Other | Admitting: Urology

## 2023-09-28 ENCOUNTER — Ambulatory Visit: Payer: Medicare Other

## 2023-09-28 VITALS — BP 142/54 | HR 90 | Ht 71.0 in | Wt 273.0 lb

## 2023-09-28 DIAGNOSIS — N5082 Scrotal pain: Secondary | ICD-10-CM

## 2023-09-28 DIAGNOSIS — N481 Balanitis: Secondary | ICD-10-CM

## 2023-09-28 LAB — URINALYSIS, COMPLETE
Bilirubin, UA: NEGATIVE
Ketones, UA: NEGATIVE
Leukocytes,UA: NEGATIVE
Nitrite, UA: NEGATIVE
RBC, UA: NEGATIVE
Specific Gravity, UA: 1.025 (ref 1.005–1.030)
Urobilinogen, Ur: 1 mg/dL (ref 0.2–1.0)
pH, UA: 6 (ref 5.0–7.5)

## 2023-09-28 LAB — MICROSCOPIC EXAMINATION

## 2023-09-28 MED ORDER — NYSTATIN-TRIAMCINOLONE 100000-0.1 UNIT/GM-% EX OINT
1.0000 | TOPICAL_OINTMENT | Freq: Two times a day (BID) | CUTANEOUS | 0 refills | Status: DC
Start: 2023-09-28 — End: 2023-12-02

## 2023-09-28 MED ORDER — DOXYCYCLINE HYCLATE 100 MG PO CAPS: 100.0000 mg | ORAL_CAPSULE | Freq: Two times a day (BID) | ORAL | 0 refills | Status: DC

## 2023-09-28 NOTE — Progress Notes (Signed)
Jerry Parsons,acting as a scribe for Jerry Scotland, MD.,have documented all relevant documentation on the behalf of Jerry Scotland, MD,as directed by  Jerry Scotland, MD while in the presence of Jerry Scotland, MD.  09/28/2023 10:14 AM   Jerry Parsons November 18, 1951 [id number]  Referring provider: [name], NP [address] [place],  [place] [address]  Chief Complaint  Patient presents with   Establish Care   Testicle Pain    HPI: 72 year-old male who is referred for further evaluation of scrotal pain. Notably, he was a former patient of Dr. [name] for hypogonadism, erectile dysfunction, and obesity. Unfortunately, we do not have the complete records for today. He is currently on Flomax.   His urinalysis today is negative.   Scrotal pain has been present since May, primarily in the morning but persists throughout the day, improving with pain medication. He reports the right side of the scrotum is more painful. He denies any trauma to the scrotum. He has a history of double hernias diagnosed at a walk-in clinic, but subsequent evaluations have been inconclusive. No scrotal imaging has been performed. He experiences crusting around the penis, which is partially circumcised.  He has a history of nine back surgeries and is on pain medication, which he believes helps alleviate the scrotal pain. He has a history of severe constipation attributed to the pain medication.   He is a diabetic.   Results for orders placed or performed in visit on 09/28/23  Microscopic Examination   Urine  Result Value Ref Range   WBC, UA 0-5 0 - 5 /hpf   RBC, Urine 0-2 0 - 2 /hpf   Epithelial Cells (non renal) 0-10 0 - 10 /hpf   Bacteria, UA Few None seen/Few  Urinalysis, Complete  Result Value Ref Range   Specific Gravity, UA 1.025 1.005 - 1.030   pH, UA 6.0 5.0 - 7.5   Color, UA Yellow Yellow   Appearance Ur Clear Clear   Leukocytes,UA Negative Negative   Protein,UA 1+ (A)  Negative/Trace   Glucose, UA Trace (A) Negative   Ketones, UA Negative Negative   RBC, UA Negative Negative   Bilirubin, UA Negative Negative   Urobilinogen, Ur 1.0 0.2 - 1.0 mg/dL   Nitrite, UA Negative Negative   Microscopic Examination See below:       PMH: 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    x2   GERD (gastroesophageal reflux disease)    Headache    h/o as a child   Hypercholesteremia    Hypertension    Kidney failure    acute   Neuromuscular disorder (HCC)    Neuropathy    Pneumonia    time 9 ;last episode 12/2015   Pulmonary embolus (HCC) 2011   Seasonal allergies    Seizures (HCC)    as child    Sleep apnea    BIPAP   Stiff neck    limited turning s/p titanium plate placement   TIA (transient ischemic attack)    Wears dentures    full upper and lower    Surgical History:  Past Surgical History:  Procedure Laterality Date   BACK SURGERY     x 8; upper x 3 & lower x 5   CARDIOVERSION  03/14/13, 10/16   2014 - [facility], 2016 - [facility]   CARDIOVERSION N/A 09/11/2020   Procedure: CARDIOVERSION;  Surgeon: Antonieta Iba, MD;  Location: [facility] OR[facility];  Service: Cardiovascular;  Laterality: N/A;   CATARACT EXTRACTION W/PHACO Left 10/29/2015   Procedure: CATARACT EXTRACTION PHACO AND INTRAOCULAR LENS PLACEMENT (IOC);  Surgeon: Lockie Mola, MD;  Location: [facility];  Service: Ophthalmology;  Laterality: Left;  DIABETIC - insulin and oral medsSleep apnea - no machine   CATARACT EXTRACTION W/PHACO Right 02/16/2023   Procedure: CATARACT EXTRACTION PHACO AND INTRAOCULAR LENS PLACEMENT (IOC) RIGHT DIABETIC MALYUGIN HEALON 5 VISION BLUE  65.52  03:57.7;  Surgeon:  Lockie Mola, MD;  Location: [facility];  Service: Ophthalmology;  Laterality: Right;  Diabetic   CHOLECYSTECTOMY     COLONOSCOPY WITH PROPOFOL N/A 01/16/2018   Procedure: COLONOSCOPY WITH PROPOFOL;  Surgeon: Scot Jun, MD;  Location: [facility];  Service: Endoscopy;  Laterality: N/A;   ESOPHAGOGASTRODUODENOSCOPY (EGD) WITH PROPOFOL N/A 01/16/2018   Procedure: ESOPHAGOGASTRODUODENOSCOPY (EGD) WITH PROPOFOL;  Surgeon: Scot Jun, MD;  Location: [facility] ENDOSCOPY;  Service: Endoscopy;  Laterality: N/A;   EYE SURGERY     GALLBLADDER SURGERY  2002   JOINT REPLACEMENT Right 2018   KNEE ARTHROSCOPY     left    PARATHYROIDECTOMY N/A 07/25/2020   Procedure: PARATHYROIDECTOMY;  Surgeon: Duanne Guess, MD;  Location: [facility] OR[facility];  Service: General;  Laterality: N/A;   ROTATOR CUFF REPAIR  2001   left    TEE WITHOUT CARDIOVERSION N/A 04/26/2018   Procedure: TRANSESOPHAGEAL ECHOCARDIOGRAM (TEE);  Surgeon: Yvonne Kendall, MD;  Location: [facility];  Service: Cardiovascular;  Laterality: N/A;   TONSILLECTOMY     TOTAL KNEE ARTHROPLASTY Right 06/20/2017   Procedure: RIGHT TOTAL KNEE ARTHROPLASTY;  Surgeon: Ollen Gross, MD;  Location: [facility];  Service: Orthopedics;  Laterality: Right;    Home Medications:  Allergies as of 09/28/2023       Reactions   Carisoprodol Itching        Medication List        Accurate as of September 28, 2023 10:14 AM. If you have any questions, ask your nurse or doctor.          STOP taking these medications    esomeprazole 40 MG capsule Commonly known as: NEXIUM   Mounjaro 2.5 MG/0.5ML Pen Generic drug: tirzepatide       TAKE these medications    amiodarone 200 MG tablet Commonly known as: PACERONE TAKE 1 TABLET BY MOUTH EVERY DAY   aspirin EC 81 MG tablet Take 81 mg by mouth daily. Swallow whole.   Basaglar KwikPen 100 UNIT/ML INJECT 40 UNITS            SUBCUTANEOUSLY TWO TIMES A DAY   doxycycline 100 MG  capsule Commonly known as: VIBRAMYCIN Take 1 capsule (100 mg total) by mouth 2 (two) times daily.   Eliquis 5 MG Tabs tablet Generic drug: apixaban TAKE 1 TABLET TWICE A DAY   Farxiga 10 MG Tabs tablet Generic drug: dapagliflozin propanediol TAKE 1 TABLET BY MOUTH EVERY DAY   ferrous sulfate 325 (65 FE) MG tablet Take 1 tablet by mouth daily.   fexofenadine 180 MG tablet Commonly known as: ALLEGRA Take 180 mg by mouth daily.   Fish Oil 1200 MG Caps Take 1,200 mg by  mouth 2 (two) times daily.   FreeStyle Libre 2 Reader Devi Inject 1 Device into the skin daily. DGX   FreeStyle Libre 2 Sensor Misc 2 Devices by Does not apply route daily.   furosemide 40 MG tablet Commonly known as: LASIX TAKE 1 TABLET BY MOUTH EVERY DAY   gabapentin 300 MG capsule Commonly known as: NEURONTIN Take 1 capsule (300 mg total) by mouth at bedtime.   gabapentin 100 MG capsule Commonly known as: NEURONTIN TAKE 1 CAPSULE BY MOUTH TWICE A DAY   Januvia 100 MG tablet Generic drug: sitaGLIPtin TAKE 1 TABLET DAILY   linaclotide 145 MCG Caps capsule Commonly known as: LINZESS Take 145 mcg by mouth daily before breakfast.   magnesium oxide 400 MG tablet Commonly known as: MAG-OX TAKE 1 TABLET BY MOUTH TWICE A DAY   meloxicam 7.5 MG tablet Commonly known as: MOBIC TAKE 1 TABLET BY MOUTH EVERY DAY   metoprolol tartrate 100 MG tablet Commonly known as: LOPRESSOR TAKE 1 TABLET BY MOUTH TWICE A DAY   naloxone 4 MG/0.1ML Liqd nasal spray kit Commonly known as: NARCAN Please use in the event of overdose from narcotic medication   NovoLOG FlexPen 100 UNIT/ML FlexPen Generic drug: insulin aspart INJECT 14 UNITS            SUBCUTANEOUSLY IN THE      MORNING AND AT BEDTIME   nystatin-triamcinolone ointment Commonly known as: MYCOLOG Apply 1 Application topically 2 (two) times daily.   OneTouch Ultra test strip Generic drug: glucose blood USE WITH METER TWICE DAILY   oxyCODONE 40 mg 12  hr tablet Commonly known as: OxyCONTIN Take 1 tablet (40 mg total) by mouth every 8 (eight) hours as needed.   pregabalin 200 MG capsule Commonly known as: LYRICA TAKE 1 CAPSULE 3 TIMES A   DAY   Prevagen 10 MG Caps Generic drug: Apoaequorin Take 10 mg by mouth daily.   rOPINIRole 1 MG tablet Commonly known as: REQUIP TAKE 1 TO 2 TABLETS BY MOUTH AT BEDTIME AS NEEDED What changed: when to take this   rOPINIRole 2 MG tablet Commonly known as: REQUIP TAKE 1 TABLET BY MOUTH TWICE A DAY AS NEEDED What changed: when to take this   sucralfate 1 g tablet Commonly known as: CARAFATE TAKE 1 TABLET BY MOUTH 4 (FOUR) TIMES DAILY - WITH MEALS AND AT BEDTIME.   tamsulosin 0.4 MG Caps capsule Commonly known as: FLOMAX Take 0.4 mg by mouth daily.        Allergies:  Allergies  Allergen Reactions   Carisoprodol Itching    Family History: Family History  Problem Relation Age of Onset   Heart attack Brother     Social History:  reports that he quit smoking about 33 years ago. His smoking use included cigarettes. He started smoking about 53 years ago. He has a 40 pack-year smoking history. He has never used smokeless tobacco. He reports that he does not drink alcohol and does not use drugs.   Physical Exam: BP (!) 142/54   Pulse 90   Ht 5\' 11"  (1.803 m)   Wt 273 lb (123.8 kg)   BMI 38.08 kg/m   Constitutional:  Alert and oriented, No acute distress. HEENT: [place] AT, moist mucus membranes.  Trachea midline, no masses. GU:  Tenderness noted in the right epididymis but otherwise structurally normal.  Left testicle normal, fatty left cord appreciated.  Right testicle also normal, distended.  Phallus with redundant ventral foreskin with a slight tight  phimotic ring, partial circumcision dorsally, erythema present. Neurologic: Grossly intact, no focal deficits, moving all 4 extremities. Psychiatric: Normal mood and affect.  Pertinent Imaging: EXAM: CT ABDOMEN AND PELVIS WITH  CONTRAST   TECHNIQUE: Multidetector CT imaging of the abdomen and pelvis was performed using the standard protocol following bolus administration of intravenous contrast.   RADIATION DOSE REDUCTION: This exam was performed according to the departmental dose-optimization program which includes automated exposure control, adjustment of the mA and/or kV according to patient size and/or use of iterative reconstruction technique.   CONTRAST:  Oral contrast and ISOVUE-300 IOPAMIDOL (ISOVUE-300) INJECTION 61%   COMPARISON:  04/02/2013   FINDINGS: Lower chest: Lung bases clear.  No pleural or pericardial effusion.   Hepatobiliary: No focal liver abnormality is seen. Status post cholecystectomy. No biliary dilatation.   Pancreas: Fatty atrophy. No focal lesions or peripancreatic inflammatory changes.   Spleen: Normal in size without focal abnormality.   Adrenals/Urinary Tract: No adrenal lesions. Left-sided renal cysts measure 2.2 cm and 1.8 cm. Right kidney cyst measures 2.0 cm. These cysts do not need to be followed up with imaging. No hydronephrosis or nephrolithiasis. No stones in the ureters. Unremarkable urinary bladder.   Stomach/Bowel: Stomach is within normal limits. Appendix not seen and no evidence of appendicitis. No evidence of bowel wall thickening, distention, or inflammatory changes.   Vascular/Lymphatic: Aortic atherosclerosis. No enlarged abdominal or pelvic lymph nodes.   Reproductive: Prostate is unremarkable.   Other: No abdominal wall hernia or abnormality. No abdominopelvic ascites.   Musculoskeletal: Thoracolumbosacral degenerative changes. Posterior pedicle fusion and discectomies throughout the L-spine.   IMPRESSION: 1. Bilateral renal cysts. 2. Thoracolumbosacral degenerative and postop changes. 3. Atheromatous calcifications. 4. No acute abdominal or pelvic pathology.     Electronically Signed   By: Layla Maw M.D.   On:  09/03/2023 15:11  This was personally reviewed and I agree with the radiologic interpretation.   Assessment & Plan:    1. Scrotal pain - Scrotal ultrasound to evaluate for any underlying pathology - Doxycycline twice daily for seven days to address potential inflammation - Advised to continue wearing supportive underwear - If symptoms persist, referral to a scrotal pain specialist may be considered - CT reviewed to assess for any hernias or other abdominal issues-none appreciated.  2. [place] - Suspect that this is chronic - Mycolog for yeast infection.    Will call with ultrasound results, will assess response to treatment by phone.   I have reviewed the above documentation for accuracy and completeness, and I agree with the above.   Jerry Scotland, MD  [facility] [address], [address] [place], [place] [address] [phone]

## 2023-09-30 ENCOUNTER — Other Ambulatory Visit: Payer: Self-pay | Admitting: Podiatry

## 2023-09-30 ENCOUNTER — Other Ambulatory Visit: Payer: Self-pay | Admitting: Cardiovascular Disease

## 2023-09-30 DIAGNOSIS — I480 Paroxysmal atrial fibrillation: Secondary | ICD-10-CM

## 2023-09-30 MED ORDER — MELOXICAM 7.5 MG PO TABS: 7.5000 mg | ORAL_TABLET | Freq: Every day | ORAL | 0 refills | Status: DC

## 2023-09-30 NOTE — Telephone Encounter (Signed)
Pt last saw Dr Mariah Milling 01/31/23, last labs 09/07/23 Creat 1.5, age 72, weight 123.8, based on specified criteria pt is on appropriate dosage of Eliquis 5mg  BID for afib.  Will refill rx.

## 2023-10-03 ENCOUNTER — Ambulatory Visit: Payer: Medicare Other

## 2023-10-05 ENCOUNTER — Ambulatory Visit: Payer: Medicare Other

## 2023-10-06 ENCOUNTER — Ambulatory Visit
Admission: RE | Admit: 2023-10-06 | Discharge: 2023-10-06 | Disposition: A | Discharge status: Home / Self Care | Payer: Medicare Other | Source: Ambulatory Visit | Attending: Urology | Admitting: Urology

## 2023-10-06 DIAGNOSIS — N5082 Scrotal pain: Secondary | ICD-10-CM | POA: Diagnosis not present

## 2023-10-06 DIAGNOSIS — N481 Balanitis: Secondary | ICD-10-CM | POA: Insufficient documentation

## 2023-10-06 DIAGNOSIS — N433 Hydrocele, unspecified: Secondary | ICD-10-CM | POA: Diagnosis not present

## 2023-10-06 DIAGNOSIS — N503 Cyst of epididymis: Secondary | ICD-10-CM | POA: Diagnosis not present

## 2023-10-10 ENCOUNTER — Ambulatory Visit: Payer: Medicare Other

## 2023-10-10 DIAGNOSIS — G5710 Meralgia paresthetica, unspecified lower limb: Secondary | ICD-10-CM | POA: Diagnosis not present

## 2023-10-10 DIAGNOSIS — R262 Difficulty in walking, not elsewhere classified: Secondary | ICD-10-CM | POA: Diagnosis not present

## 2023-10-10 DIAGNOSIS — R2681 Unsteadiness on feet: Secondary | ICD-10-CM | POA: Diagnosis not present

## 2023-10-10 DIAGNOSIS — M545 Low back pain, unspecified: Secondary | ICD-10-CM | POA: Diagnosis not present

## 2023-10-10 DIAGNOSIS — G8929 Other chronic pain: Secondary | ICD-10-CM | POA: Diagnosis not present

## 2023-10-10 DIAGNOSIS — R42 Dizziness and giddiness: Secondary | ICD-10-CM | POA: Diagnosis not present

## 2023-10-12 ENCOUNTER — Ambulatory Visit: Payer: Medicare Other

## 2023-10-13 ENCOUNTER — Emergency Department: Payer: Medicare Other

## 2023-10-13 ENCOUNTER — Inpatient Hospital Stay
Admission: EM | Admit: 2023-10-13 | Discharge: 2023-10-15 | DRG: 193 | Disposition: A | Discharge status: Home / Self Care | Payer: Medicare Other | Attending: Internal Medicine | Admitting: Internal Medicine

## 2023-10-13 ENCOUNTER — Other Ambulatory Visit: Payer: Self-pay

## 2023-10-13 DIAGNOSIS — Z7984 Long term (current) use of oral hypoglycemic drugs: Secondary | ICD-10-CM

## 2023-10-13 DIAGNOSIS — Z87891 Personal history of nicotine dependence: Secondary | ICD-10-CM

## 2023-10-13 DIAGNOSIS — I4892 Unspecified atrial flutter: Secondary | ICD-10-CM | POA: Diagnosis present

## 2023-10-13 DIAGNOSIS — Z8673 Personal history of transient ischemic attack (TIA), and cerebral infarction without residual deficits: Secondary | ICD-10-CM | POA: Diagnosis not present

## 2023-10-13 DIAGNOSIS — I110 Hypertensive heart disease with heart failure: Secondary | ICD-10-CM | POA: Diagnosis present

## 2023-10-13 DIAGNOSIS — E669 Obesity, unspecified: Secondary | ICD-10-CM | POA: Diagnosis present

## 2023-10-13 DIAGNOSIS — N400 Enlarged prostate without lower urinary tract symptoms: Secondary | ICD-10-CM | POA: Diagnosis present

## 2023-10-13 DIAGNOSIS — D696 Thrombocytopenia, unspecified: Secondary | ICD-10-CM | POA: Diagnosis present

## 2023-10-13 DIAGNOSIS — Z79899 Other long term (current) drug therapy: Secondary | ICD-10-CM

## 2023-10-13 DIAGNOSIS — R042 Hemoptysis: Secondary | ICD-10-CM | POA: Diagnosis present

## 2023-10-13 DIAGNOSIS — Z86718 Personal history of other venous thrombosis and embolism: Secondary | ICD-10-CM

## 2023-10-13 DIAGNOSIS — Z6838 Body mass index (BMI) 38.0-38.9, adult: Secondary | ICD-10-CM | POA: Diagnosis not present

## 2023-10-13 DIAGNOSIS — Z794 Long term (current) use of insulin: Secondary | ICD-10-CM | POA: Diagnosis not present

## 2023-10-13 DIAGNOSIS — K219 Gastro-esophageal reflux disease without esophagitis: Secondary | ICD-10-CM | POA: Diagnosis present

## 2023-10-13 DIAGNOSIS — R0902 Hypoxemia: Secondary | ICD-10-CM | POA: Diagnosis not present

## 2023-10-13 DIAGNOSIS — I480 Paroxysmal atrial fibrillation: Secondary | ICD-10-CM | POA: Diagnosis present

## 2023-10-13 DIAGNOSIS — Z96651 Presence of right artificial knee joint: Secondary | ICD-10-CM | POA: Diagnosis present

## 2023-10-13 DIAGNOSIS — R079 Chest pain, unspecified: Secondary | ICD-10-CM | POA: Diagnosis not present

## 2023-10-13 DIAGNOSIS — I517 Cardiomegaly: Secondary | ICD-10-CM | POA: Diagnosis not present

## 2023-10-13 DIAGNOSIS — R918 Other nonspecific abnormal finding of lung field: Secondary | ICD-10-CM | POA: Diagnosis not present

## 2023-10-13 DIAGNOSIS — E7800 Pure hypercholesterolemia, unspecified: Secondary | ICD-10-CM | POA: Diagnosis present

## 2023-10-13 DIAGNOSIS — J189 Pneumonia, unspecified organism: Secondary | ICD-10-CM | POA: Diagnosis not present

## 2023-10-13 DIAGNOSIS — I700 Atherosclerosis of aorta: Secondary | ICD-10-CM | POA: Diagnosis not present

## 2023-10-13 DIAGNOSIS — I5032 Chronic diastolic (congestive) heart failure: Secondary | ICD-10-CM | POA: Diagnosis present

## 2023-10-13 DIAGNOSIS — Z1152 Encounter for screening for COVID-19: Secondary | ICD-10-CM | POA: Diagnosis not present

## 2023-10-13 DIAGNOSIS — I4430 Unspecified atrioventricular block: Secondary | ICD-10-CM | POA: Diagnosis present

## 2023-10-13 DIAGNOSIS — E1142 Type 2 diabetes mellitus with diabetic polyneuropathy: Secondary | ICD-10-CM | POA: Diagnosis present

## 2023-10-13 DIAGNOSIS — Z7901 Long term (current) use of anticoagulants: Secondary | ICD-10-CM | POA: Diagnosis not present

## 2023-10-13 DIAGNOSIS — R0789 Other chest pain: Secondary | ICD-10-CM | POA: Diagnosis not present

## 2023-10-13 DIAGNOSIS — H5461 Unqualified visual loss, right eye, normal vision left eye: Secondary | ICD-10-CM | POA: Diagnosis present

## 2023-10-13 DIAGNOSIS — Z86711 Personal history of pulmonary embolism: Secondary | ICD-10-CM | POA: Diagnosis not present

## 2023-10-13 DIAGNOSIS — Z7982 Long term (current) use of aspirin: Secondary | ICD-10-CM

## 2023-10-13 DIAGNOSIS — J9601 Acute respiratory failure with hypoxia: Secondary | ICD-10-CM | POA: Diagnosis present

## 2023-10-13 DIAGNOSIS — K920 Hematemesis: Secondary | ICD-10-CM | POA: Diagnosis not present

## 2023-10-13 DIAGNOSIS — Z8249 Family history of ischemic heart disease and other diseases of the circulatory system: Secondary | ICD-10-CM | POA: Diagnosis not present

## 2023-10-13 LAB — BASIC METABOLIC PANEL
Anion gap: 9 (ref 5–15)
BUN: 31 mg/dL — ABNORMAL HIGH (ref 8–23)
CO2: 30 mmol/L (ref 22–32)
Calcium: 8.5 mg/dL — ABNORMAL LOW (ref 8.9–10.3)
Chloride: 100 mmol/L (ref 98–111)
Creatinine, Ser: 1.32 mg/dL — ABNORMAL HIGH (ref 0.61–1.24)
GFR, Estimated: 57 mL/min — ABNORMAL LOW (ref >60)
Glucose, Bld: 151 mg/dL — ABNORMAL HIGH (ref 70–99)
Potassium: 4.4 mmol/L (ref 3.5–5.1)
Sodium: 139 mmol/L (ref 135–145)

## 2023-10-13 LAB — RESP PANEL BY RT-PCR (RSV, FLU A&B, COVID)  RVPGX2
Influenza A by PCR: NEGATIVE
Influenza B by PCR: NEGATIVE
Resp Syncytial Virus by PCR: NEGATIVE
SARS Coronavirus 2 by RT PCR: NEGATIVE

## 2023-10-13 LAB — CBC
HCT: 43.3 % (ref 39.0–52.0)
Hemoglobin: 13.4 g/dL (ref 13.0–17.0)
MCH: 26.7 pg (ref 26.0–34.0)
MCHC: 30.9 g/dL (ref 30.0–36.0)
MCV: 86.4 fL (ref 80.0–100.0)
Platelets: 103 10*3/uL — ABNORMAL LOW (ref 150–400)
RBC: 5.01 MIL/uL (ref 4.22–5.81)
RDW: 15.6 % — ABNORMAL HIGH (ref 11.5–15.5)
WBC: 9.1 10*3/uL (ref 4.0–10.5)
nRBC: 0 % (ref 0.0–0.2)

## 2023-10-13 LAB — TYPE AND SCREEN
ABO/RH(D): O POS
Antibody Screen: NEGATIVE

## 2023-10-13 LAB — PROTIME-INR
INR: 1.5 — ABNORMAL HIGH (ref 0.8–1.2)
Prothrombin Time: 18 s — ABNORMAL HIGH (ref 11.4–15.2)

## 2023-10-13 LAB — LACTIC ACID, PLASMA
Lactic Acid, Venous: 1 mmol/L (ref 0.5–1.9)
Lactic Acid, Venous: 0.6 mmol/L (ref 0.5–1.9)

## 2023-10-13 LAB — TROPONIN I (HIGH SENSITIVITY)
Troponin I (High Sensitivity): 9 ng/L (ref <18)
Troponin I (High Sensitivity): 8 ng/L (ref <18)

## 2023-10-13 MED ORDER — BASAGLAR KWIKPEN 100 UNIT/ML ~~LOC~~ SOPN: 40.0000 [IU] | PEN_INJECTOR | SUBCUTANEOUS | Status: DC

## 2023-10-13 MED ORDER — ROPINIROLE HCL 1 MG PO TABS
2.0000 mg | ORAL_TABLET | Freq: Every day | ORAL | Status: DC
  Administered 2023-10-13 – 2023-10-14 (×2): 2 mg via ORAL
  Filled 2023-10-13 (×2): qty 2

## 2023-10-13 MED ORDER — AMIODARONE HCL 200 MG PO TABS
200.0000 mg | ORAL_TABLET | Freq: Every day | ORAL | Status: DC
  Administered 2023-10-14 – 2023-10-15 (×2): 200 mg via ORAL
  Filled 2023-10-13 (×2): qty 1

## 2023-10-13 MED ORDER — MELOXICAM 7.5 MG PO TABS
7.5000 mg | ORAL_TABLET | Freq: Every day | ORAL | Status: DC
  Filled 2023-10-13: qty 1

## 2023-10-13 MED ORDER — MAGNESIUM HYDROXIDE 400 MG/5ML PO SUSP
30.0000 mL | Freq: Every day | ORAL | Status: DC | PRN
  Administered 2023-10-13: 30 mL via ORAL
  Filled 2023-10-13: qty 30

## 2023-10-13 MED ORDER — ROPINIROLE HCL 1 MG PO TABS: 1.0000 mg | ORAL_TABLET | Freq: Two times a day (BID) | ORAL | Status: DC

## 2023-10-13 MED ORDER — LORATADINE 10 MG PO TABS
10.0000 mg | ORAL_TABLET | Freq: Every day | ORAL | Status: DC
  Administered 2023-10-14 – 2023-10-15 (×2): 10 mg via ORAL
  Filled 2023-10-13 (×2): qty 1

## 2023-10-13 MED ORDER — ACETAMINOPHEN 650 MG RE SUPP: 650.0000 mg | Freq: Four times a day (QID) | RECTAL | Status: DC | PRN

## 2023-10-13 MED ORDER — LINAGLIPTIN 5 MG PO TABS
5.0000 mg | ORAL_TABLET | Freq: Every day | ORAL | Status: DC
  Filled 2023-10-13: qty 1

## 2023-10-13 MED ORDER — SODIUM CHLORIDE 0.9 % IV SOLN
500.0000 mg | Freq: Once | INTRAVENOUS | Status: AC
  Administered 2023-10-13: 500 mg via INTRAVENOUS
  Filled 2023-10-13: qty 5

## 2023-10-13 MED ORDER — ACETAMINOPHEN 325 MG PO TABS: 650.0000 mg | ORAL_TABLET | Freq: Four times a day (QID) | ORAL | Status: DC | PRN

## 2023-10-13 MED ORDER — IOHEXOL 350 MG/ML SOLN
100.0000 mL | Freq: Once | INTRAVENOUS | Status: AC | PRN
  Administered 2023-10-13: 75 mL via INTRAVENOUS

## 2023-10-13 MED ORDER — ONDANSETRON HCL 4 MG PO TABS: 4.0000 mg | ORAL_TABLET | Freq: Four times a day (QID) | ORAL | Status: DC | PRN

## 2023-10-13 MED ORDER — ASPIRIN 81 MG PO TBEC
81.0000 mg | DELAYED_RELEASE_TABLET | Freq: Every day | ORAL | Status: DC
  Administered 2023-10-14 – 2023-10-15 (×2): 81 mg via ORAL
  Filled 2023-10-13 (×2): qty 1

## 2023-10-13 MED ORDER — OXYCODONE HCL ER 15 MG PO T12A
40.0000 mg | EXTENDED_RELEASE_TABLET | Freq: Three times a day (TID) | ORAL | Status: DC | PRN
  Administered 2023-10-13 – 2023-10-15 (×4): 40 mg via ORAL
  Filled 2023-10-13: qty 1
  Filled 2023-10-13 (×2): qty 4
  Filled 2023-10-13: qty 1

## 2023-10-13 MED ORDER — APIXABAN 5 MG PO TABS
5.0000 mg | ORAL_TABLET | Freq: Two times a day (BID) | ORAL | Status: DC
  Administered 2023-10-13 – 2023-10-15 (×4): 5 mg via ORAL
  Filled 2023-10-13 (×4): qty 1

## 2023-10-13 MED ORDER — PREGABALIN 75 MG PO CAPS: 200.0000 mg | ORAL_CAPSULE | Freq: Three times a day (TID) | ORAL | Status: DC

## 2023-10-13 MED ORDER — SODIUM CHLORIDE 0.9 % IV SOLN
1.0000 g | Freq: Once | INTRAVENOUS | Status: AC
  Administered 2023-10-13: 1 g via INTRAVENOUS
  Filled 2023-10-13: qty 10

## 2023-10-13 MED ORDER — TAMSULOSIN HCL 0.4 MG PO CAPS
0.4000 mg | ORAL_CAPSULE | Freq: Every day | ORAL | Status: DC
  Administered 2023-10-14 – 2023-10-15 (×2): 0.4 mg via ORAL
  Filled 2023-10-13 (×2): qty 1

## 2023-10-13 MED ORDER — TRAZODONE HCL 50 MG PO TABS: 25.0000 mg | ORAL_TABLET | Freq: Every evening | ORAL | Status: DC | PRN

## 2023-10-13 MED ORDER — HYDROCOD POLI-CHLORPHE POLI ER 10-8 MG/5ML PO SUER: 5.0000 mL | Freq: Two times a day (BID) | ORAL | Status: DC | PRN

## 2023-10-13 MED ORDER — ENOXAPARIN SODIUM 60 MG/0.6ML IJ SOSY
0.5000 mg/kg | PREFILLED_SYRINGE | INTRAMUSCULAR | Status: DC
  Administered 2023-10-13: 62.5 mg via SUBCUTANEOUS
  Filled 2023-10-13: qty 1.2

## 2023-10-13 MED ORDER — SUCRALFATE 1 G PO TABS
1.0000 g | ORAL_TABLET | Freq: Three times a day (TID) | ORAL | Status: DC
  Administered 2023-10-13 – 2023-10-15 (×7): 1 g via ORAL
  Filled 2023-10-13 (×7): qty 1

## 2023-10-13 MED ORDER — METOPROLOL TARTRATE 50 MG PO TABS
100.0000 mg | ORAL_TABLET | Freq: Two times a day (BID) | ORAL | Status: DC
  Administered 2023-10-13 – 2023-10-15 (×4): 100 mg via ORAL
  Filled 2023-10-13 (×4): qty 2

## 2023-10-13 MED ORDER — MAGNESIUM OXIDE 400 MG PO TABS
400.0000 mg | ORAL_TABLET | Freq: Two times a day (BID) | ORAL | Status: DC
  Administered 2023-10-13 – 2023-10-15 (×4): 400 mg via ORAL
  Filled 2023-10-13 (×9): qty 1

## 2023-10-13 MED ORDER — SODIUM CHLORIDE 0.9 % IV SOLN
2.0000 g | INTRAVENOUS | Status: DC
  Administered 2023-10-14: 2 g via INTRAVENOUS
  Filled 2023-10-13 (×2): qty 20

## 2023-10-13 MED ORDER — INSULIN GLARGINE-YFGN 100 UNIT/ML ~~LOC~~ SOLN
40.0000 [IU] | Freq: Every day | SUBCUTANEOUS | Status: DC
  Administered 2023-10-14 – 2023-10-15 (×2): 40 [IU] via SUBCUTANEOUS
  Filled 2023-10-13 (×2): qty 0.4

## 2023-10-13 MED ORDER — GABAPENTIN 300 MG PO CAPS
300.0000 mg | ORAL_CAPSULE | Freq: Every day | ORAL | Status: DC
  Administered 2023-10-14 – 2023-10-15 (×2): 300 mg via ORAL
  Filled 2023-10-13 (×2): qty 1

## 2023-10-13 MED ORDER — APOAEQUORIN 10 MG PO CAPS: 10.0000 mg | ORAL_CAPSULE | Freq: Every day | ORAL | Status: DC

## 2023-10-13 MED ORDER — OMEGA-3-ACID ETHYL ESTERS 1 G PO CAPS
1.0000 g | ORAL_CAPSULE | Freq: Two times a day (BID) | ORAL | Status: DC
  Administered 2023-10-14 – 2023-10-15 (×3): 1 g via ORAL
  Filled 2023-10-13 (×4): qty 1

## 2023-10-13 MED ORDER — LACTATED RINGERS IV SOLN: INTRAVENOUS | Status: DC

## 2023-10-13 MED ORDER — DAPAGLIFLOZIN PROPANEDIOL 10 MG PO TABS
10.0000 mg | ORAL_TABLET | Freq: Every day | ORAL | Status: DC
  Administered 2023-10-14 – 2023-10-15 (×2): 10 mg via ORAL
  Filled 2023-10-13 (×2): qty 1

## 2023-10-13 MED ORDER — SODIUM CHLORIDE 0.9 % IV SOLN
1.0000 g | INTRAVENOUS | Status: DC
  Filled 2023-10-13: qty 10

## 2023-10-13 MED ORDER — FUROSEMIDE 40 MG PO TABS
40.0000 mg | ORAL_TABLET | Freq: Every day | ORAL | Status: DC
  Administered 2023-10-14: 40 mg via ORAL
  Filled 2023-10-13: qty 1

## 2023-10-13 MED ORDER — ONDANSETRON HCL 4 MG/2ML IJ SOLN
4.0000 mg | Freq: Four times a day (QID) | INTRAMUSCULAR | Status: DC | PRN
  Administered 2023-10-15: 4 mg via INTRAVENOUS
  Filled 2023-10-13: qty 2

## 2023-10-13 MED ORDER — NALOXONE HCL 4 MG/0.1ML NA LIQD: 1.0000 | Freq: Once | NASAL | Status: DC | PRN

## 2023-10-13 MED ORDER — SODIUM CHLORIDE 0.9 % IV SOLN
500.0000 mg | INTRAVENOUS | Status: DC
  Administered 2023-10-14: 500 mg via INTRAVENOUS
  Filled 2023-10-13: qty 5

## 2023-10-13 MED ORDER — LINACLOTIDE 145 MCG PO CAPS
145.0000 ug | ORAL_CAPSULE | Freq: Every day | ORAL | Status: DC
  Administered 2023-10-14: 145 ug via ORAL
  Filled 2023-10-13 (×4): qty 1

## 2023-10-13 MED ORDER — FERROUS SULFATE 325 (65 FE) MG PO TABS
325.0000 mg | ORAL_TABLET | Freq: Every day | ORAL | Status: DC
  Administered 2023-10-14 – 2023-10-15 (×2): 325 mg via ORAL
  Filled 2023-10-13 (×2): qty 1

## 2023-10-13 MED ORDER — IPRATROPIUM-ALBUTEROL 0.5-2.5 (3) MG/3ML IN SOLN
3.0000 mL | Freq: Four times a day (QID) | RESPIRATORY_TRACT | Status: DC
  Administered 2023-10-13 – 2023-10-14 (×3): 3 mL via RESPIRATORY_TRACT
  Filled 2023-10-13 (×3): qty 3

## 2023-10-13 MED ORDER — GUAIFENESIN ER 600 MG PO TB12
600.0000 mg | ORAL_TABLET | Freq: Two times a day (BID) | ORAL | Status: DC
  Administered 2023-10-13 – 2023-10-15 (×4): 600 mg via ORAL
  Filled 2023-10-13 (×4): qty 1

## 2023-10-13 NOTE — ED Provider Notes (Signed)
[facility] Provider Note    Event Date/Time   First MD Initiated Contact with Patient 10/13/23 1553     (approximate)  History   Chief Complaint: Chest Pain and GI Bleeding  HPI  Jerry Parsons is a 72 y.o. male with a past medical history of diabetes, gastric reflux, hypertension, hyperlipidemia, prior pulmonary embolus as well as atrial fibrillation on Eliquis who presents to the emergency department for cough congestion and hemoptysis.  According to the patient for the past several days he has had a worsening cough congestion today he has noticed blood streaks within his sputum so he came to the emergency department.  Wife is here with the patient was states a history of PE previously in 2011 prior to him being on Eliquis for his atrial flutter.  She also states significant number of pneumonia diagnoses in the past.  Patient found to be hypoxic 87% on room air placed on 2 L.  Physical Exam   Triage Vital Signs: ED Triage Vitals  Encounter Vitals Group     BP 10/13/23 1314 (!) 165/55     Systolic BP Percentile --      Diastolic BP Percentile --      Pulse Rate 10/13/23 1314 66     Resp 10/13/23 1314 18     Temp 10/13/23 1314 99.2 F (37.3 C)     Temp Source 10/13/23 1314 Oral     SpO2 10/13/23 1314 93 %     Weight 10/13/23 1312 270 lb (122.5 kg)     Height 10/13/23 1312 5\' 10"  (1.778 m)     Head Circumference --      Peak Flow --      Pain Score 10/13/23 1312 6     Pain Loc --      Pain Education --      Exclude from Growth Chart --     Most recent vital signs: Vitals:   10/13/23 1314  BP: (!) 165/55  Pulse: 66  Resp: 18  Temp: 99.2 F (37.3 C)  SpO2: 93%    General: Awake, no distress.  CV:  Good peripheral perfusion.  Regular rate and rhythm  Resp:  Normal effort.  Equal breath sounds bilaterally.  Abd:  No distention.  Soft, nontender.  No rebound or guarding. Other:  No lower extremity tenderness.   ED Results / Procedures /  Treatments   EKG  EKG viewed and interpreted by myself shows atrial flutter at 89 bpm with a narrow QRS, normal axis, normal intervals, nonspecific ST changes.  No ST elevation.  RADIOLOGY  I have reviewed and interpreted chest x-ray images.  Patient appears to have a consolidation in the right lower lung. Patient's x-ray read by radiology as bibasilar opacities right greater than left concerning for acute pneumonia possible aspiration.   MEDICATIONS ORDERED IN ED: Medications  azithromycin (ZITHROMAX) 500 mg in sodium chloride 0.9 % 250 mL IVPB (has no administration in time range)  cefTRIAXone (ROCEPHIN) 1 g in sodium chloride 0.9 % 100 mL IVPB (has no administration in time range)  iohexol (OMNIPAQUE) 350 MG/ML injection 100 mL (has no administration in time range)     IMPRESSION / MDM / ASSESSMENT AND PLAN / ED COURSE  I reviewed the triage vital signs and the nursing notes.  Patient's presentation is most consistent with acute presentation with potential threat to life or bodily function.  Patient presents to the emergency department for several days of worsening cough  congestion now with blood streaks in his sputum.  Denies any vomiting denies any black or bloody stool.  Patient currently satting 87 to 88% on room air with no baseline O2 requirement, placed on 2 L.  Patient's labs are resulted showing a reassuring CBC with a normal white blood cell count, reassuring chemistry negative troponin INR of 1.5.  Chest x-ray shows what appears to be pneumonia right greater than left.  Given the patient's history of PE and hypoxia with a normal white blood cell count we will obtain a CTA of the chest to further evaluate help rule out PE although the patient should be fairly well protected on Eliquis.  I have also sent a COVID swab we will cover with antibiotics send blood cultures and a lactic acid as precaution.  Patient will require admission to the hospital once his emergency department  workup is been completed given his acute respiratory failure with hypoxia secondary likely to pneumonia.  CTA is negative for PE confirms pneumonia.  COVID/flu/RSV is negative.  Lactate is normal.  Patient will be admitted to the hospital service for further workup treatment antibiotics and continued oxygen support.   CRITICAL CARE Performed by: Minna Antis   Total critical care time: 30 minutes  Critical care time was exclusive of separately billable procedures and treating other patients.  Critical care was necessary to treat or prevent imminent or life-threatening deterioration.  Critical care was time spent personally by me on the following activities: development of treatment plan with patient and/or surrogate as well as nursing, discussions with consultants, evaluation of patient's response to treatment, examination of patient, obtaining history from patient or surrogate, ordering and performing treatments and interventions, ordering and review of laboratory studies, ordering and review of radiographic studies, pulse oximetry and re-evaluation of patient's condition.   FINAL CLINICAL IMPRESSION(S) / ED DIAGNOSES   Pneumonia Hypoxia Respiratory failure secondary to pneumonia  Note:  This document was prepared using Dragon voice recognition software and may include unintentional dictation errors.   Minna Antis, MD 10/13/23 2015

## 2023-10-13 NOTE — ED Notes (Signed)
Gave patient a snack tray

## 2023-10-13 NOTE — Assessment & Plan Note (Addendum)
-   The patient will be placed on supplement coverage with NovoLog. - We will continue basal coverage. - We will continue Januvia. - We will continue Lyrica.

## 2023-10-13 NOTE — ED Triage Notes (Signed)
Pt sts that he has been having chest pain as well as vomiting pure blood up this AM.

## 2023-10-13 NOTE — Assessment & Plan Note (Signed)
-  We will continue Carafate.

## 2023-10-13 NOTE — Assessment & Plan Note (Signed)
-   This associated with atrial flutter as well with variable block. - Will continue amiodarone and Eliquis.

## 2023-10-13 NOTE — ED Notes (Signed)
RN spoke with pharmacy tech and requested he speaks with patient to do a med rec.

## 2023-10-13 NOTE — Assessment & Plan Note (Signed)
-  O2 protocol will be followed. - Management otherwise as above.

## 2023-10-13 NOTE — ED Notes (Signed)
Transfer of care report called to [facility]

## 2023-10-13 NOTE — Assessment & Plan Note (Addendum)
-   This is bibasal and right midlung community acquired pneumonia likely bacterial. - The patient has hemoptysis likely secondary to his pneumonia. - The patient will be admitted to a medical telemetry bed. - Will continue antibiotic therapy with IV Rocephin and Zithromax. - Mucolytic therapy be provided as well as duo nebs q.i.d. and q.4 hours p.r.n. - We will follow blood cultures. - Pulmonary consult will be obtained in AM. - I notified Dr. Karna Christmas about the patient.

## 2023-10-13 NOTE — H&P (Signed)
PATIENT NAME: Jerry Parsons    MR#:  [id number]  DATE OF BIRTH:  July 12, 1951  DATE OF ADMISSION:  10/13/2023  PRIMARY CARE PHYSICIAN: Bosie Clos, MD   Patient is coming from: Home  REQUESTING/REFERRING PHYSICIAN: Minna Antis, MD  CHIEF COMPLAINT:   Chief Complaint  Patient presents with   Chest Pain   GI Bleeding    HISTORY OF PRESENT ILLNESS:  Jerry Parsons is a 72 y.o. 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  He was noted to be hypoxic with a pulse oximetry of 87% on room air requiring 2 L of O2 by nasal cannula.  He denied any fever or chills.  No nausea or vomiting or abdominal pain.  No chest pain or palpitations.  No dysuria, oliguria or hematuria or flank pain.  ED Course: Upon presentation to the emergency room, BP was 165/55 with a temperature of 99.2 with otherwise normal vital signs.  Labs revealed a BUN of 31 and creatinine of 1.32 and blood Kos of 151.  High sensitive troponin I was 9 and later 8.  Lactic acid was 1 and later 0.6.  CBC showed thrombocytopenia of 103.  PT was 18 and INR 1.5.  Blood group was O+ with negative antibody screen EKG as reviewed by me : EKG showed atrial flutter with variable AV block and RSR'.  Imaging: Two-view chest x-ray showed bibasilar opacities, right greater than left, concerning for acute pneumonia especially on the right and if aspiration is not excluded. Chest CTA revealed no evidence for PE.  It showed bilateral lower lobe and right middle lobe airspace opacities concerning for edema or pneumonia and aortic  atherosclerosis.  The patient was given IV Zithromax and Rocephin and was placed on 2 L of O2 by nasal cannula as mentioned above with pulse oximetry of 95-96%.  She will be admitted to a medical telemetry bed for further evaluation and management.  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    x2   GERD (gastroesophageal reflux disease)    Headache    h/o as a child   Hypercholesteremia    Hypertension    Kidney failure    acute   Neuromuscular disorder (HCC)    Neuropathy    Pneumonia    time 9 ;last episode 12/2015   Pulmonary embolus (HCC) 2011   Seasonal allergies    Seizures (HCC)    as child    Sleep apnea    BIPAP   Stiff neck    limited turning s/p titanium plate placement   TIA (transient ischemic attack)    Wears dentures    full upper and lower    PAST SURGICAL HISTORY:   Past  Surgical History:  Procedure Laterality Date   BACK SURGERY     x 8; upper x 3 & lower x 5   CARDIOVERSION  03/14/13, 10/16   2014 - [facility], 2016 - [facility]   CARDIOVERSION N/A 09/11/2020   Procedure: CARDIOVERSION;  Surgeon: Antonieta Iba, MD;  Location: [facility];  Service: Cardiovascular;  Laterality: N/A;   CATARACT EXTRACTION W/PHACO Left 10/29/2015   Procedure: CATARACT EXTRACTION PHACO AND INTRAOCULAR LENS PLACEMENT (IOC);  Surgeon: Lockie Mola, MD;  Location: [facility];  Service: Ophthalmology;  Laterality: Left;  DIABETIC - insulin and oral medsSleep apnea - no machine   CATARACT EXTRACTION W/PHACO Right 02/16/2023   Procedure: CATARACT EXTRACTION PHACO AND INTRAOCULAR LENS PLACEMENT (IOC) RIGHT DIABETIC MALYUGIN HEALON 5 VISION BLUE  65.52   03:57.7;  Surgeon: Lockie Mola, MD;  Location: [facility];  Service: Ophthalmology;  Laterality: Right;  Diabetic   CHOLECYSTECTOMY     COLONOSCOPY WITH PROPOFOL N/A 01/16/2018   Procedure: COLONOSCOPY WITH PROPOFOL;  Surgeon: Scot Jun, MD;  Location: [facility];  Service: Endoscopy;  Laterality: N/A;   ESOPHAGOGASTRODUODENOSCOPY (EGD) WITH PROPOFOL N/A 01/16/2018   Procedure: ESOPHAGOGASTRODUODENOSCOPY (EGD) WITH PROPOFOL;  Surgeon: Scot Jun, MD;  Location: [facility] END[facility];  Service: Endoscopy;  Laterality: N/A;   EYE SURGERY     GALLBLADDER SURGERY  2002   JOINT REPLACEMENT Right 2018   KNEE ARTHROSCOPY     left    PARATHYROIDECTOMY N/A 07/25/2020   Procedure: PARATHYROIDECTOMY;  Surgeon: Duanne Guess, MD;  Location: [facility] OR[facility];  Service: General;  Laterality: N/A;   ROTATOR CUFF REPAIR  2001   left    TEE WITHOUT CARDIOVERSION N/A 04/26/2018   Procedure: TRANSESOPHAGEAL ECHOCARDIOGRAM (TEE);  Surgeon: Yvonne Kendall, MD;  Location: [facility];  Service: Cardiovascular;  Laterality: N/A;   TONSILLECTOMY     TOTAL KNEE ARTHROPLASTY Right 06/20/2017   Procedure: RIGHT TOTAL KNEE ARTHROPLASTY;  Surgeon: Ollen Gross, MD;  Location: [facility];  Service: Orthopedics;  Laterality: Right;    SOCIAL HISTORY:   Social History   Tobacco Use   Smoking status: Former    Current packs/day: 0.00    Average packs/day: 2.0 packs/day for 20.0 years (40.0 ttl pk-yrs)    Types: Cigarettes    Start date: 12/26/1969    Quit date: 12/26/1989    Years since quitting: 33.8   Smokeless tobacco: Never  Substance Use Topics   Alcohol use: No    FAMILY HISTORY:   Family History  Problem Relation Age of Onset   Heart attack Brother     DRUG ALLERGIES:   Allergies  Allergen Reactions   Carisoprodol Itching    REVIEW OF SYSTEMS:   ROS As per history of present illness. All pertinent systems were reviewed above. Constitutional, HEENT, cardiovascular,  respiratory, GI, GU, musculoskeletal, neuro, psychiatric, endocrine, integumentary and hematologic systems were reviewed and are otherwise negative/unremarkable except for positive findings mentioned above in the HPI.   MEDICATIONS AT HOME:   Prior to Admission medications   Medication Sig Start Date End Date Taking? Authorizing Provider  meloxicam (MOBIC) 7.5 MG tablet Take 1 tablet (7.5 mg total) by mouth daily. 09/30/23   Standiford, Jenelle Mages, DPM  amiodarone (PACERONE) 200 MG tablet TAKE 1 TABLET BY MOUTH EVERY DAY 06/20/23   Gollan, Tollie Pizza, MD  Apoaequorin (PREVAGEN) 10 MG CAPS Take 10 mg by mouth daily.    [provider]  aspirin EC 81 MG tablet Take 81 mg by mouth  daily. Swallow whole.    [provider]  Continuous Blood Gluc Receiver (FREESTYLE LIBRE 2 READER) DEVI Inject 1 Device into the skin daily. DGX 01/13/23   Simmons-Robinson, Makiera, MD  Continuous Blood Gluc Sensor (FREESTYLE LIBRE 2 SENSOR) MISC 2 Devices by Does not apply route daily. 01/13/23   Simmons-Robinson, Tawanna Cooler, MD  doxycycline (VIBRAMYCIN) 100 MG capsule Take 1 capsule (100 mg total) by mouth 2 (two) times daily. 09/28/23   Vanna Scotland, MD  ELIQUIS 5 MG TABS tablet TAKE 1 TABLET TWICE A DAY 09/30/23   Antonieta Iba, MD  FARXIGA 10 MG TABS tablet TAKE 1 TABLET BY MOUTH EVERY DAY 10/04/22   Ostwalt, Edmon Crape, PA-C  ferrous sulfate 325 (65 FE) MG tablet Take 1 tablet by mouth daily. 08/12/21   [provider]  fexofenadine (ALLEGRA) 180 MG tablet Take 180 mg by mouth daily.    [provider]  furosemide (LASIX) 40 MG tablet TAKE 1 TABLET BY MOUTH EVERY DAY 08/30/23   Antonieta Iba, MD  gabapentin (NEURONTIN) 100 MG capsule TAKE 1 CAPSULE BY MOUTH TWICE A DAY 08/30/23   Malva Limes, MD  gabapentin (NEURONTIN) 300 MG capsule Take 1 capsule (300 mg total) by mouth at bedtime. 11/12/22   Malva Limes, MD  Insulin Glargine [facility]) 100 UNIT/ML INJECT 40 UNITS             SUBCUTANEOUSLY TWO TIMES A DAY 05/31/23   Simmons-Robinson, Tawanna Cooler, MD  JANUVIA 100 MG tablet TAKE 1 TABLET DAILY 09/10/22   Bosie Clos, MD  linaclotide [facility]) 145 MCG CAPS capsule Take 145 mcg by mouth daily before breakfast. 09/08/23   [provider]  magnesium oxide (MAG-OX) 400 MG tablet TAKE 1 TABLET BY MOUTH TWICE A DAY 07/26/22   Bosie Clos, MD  metoprolol tartrate (LOPRESSOR) 100 MG tablet TAKE 1 TABLET BY MOUTH TWICE A DAY 07/12/22   Bosie Clos, MD  naloxone [facility]) nasal spray 4 mg/0.1 mL Please use in the event of overdose from narcotic medication 12/29/22   Simmons-Robinson, Tawanna Cooler, MD  NOVOLOG FLEXPEN 100 UNIT/ML FlexPen INJECT 14 UNITS            SUBCUTANEOUSLY IN THE      MORNING AND AT BEDTIME 04/26/22   Bosie Clos, MD  nystatin-triamcinolone ointment [facility]) Apply 1 Application topically 2 (two) times daily. 09/28/23   Vanna Scotland, MD  Omega-3 Fatty Acids (FISH OIL) 1200 MG CAPS Take 1,200 mg by mouth 2 (two) times daily.     [provider]  [facility] ULTRA test strip USE WITH METER TWICE DAILY 09/13/22   Bosie Clos, MD  oxyCODONE (OXYCONTIN) 40 mg 12 hr tablet Take 1 tablet (40 mg total) by mouth every 8 (eight) hours as needed. 12/09/22   Simmons-Robinson, Tawanna Cooler, MD  pregabalin (LYRICA) 200 MG capsule TAKE 1 CAPSULE 3 TIMES A   DAY 09/10/22   Bosie Clos, MD  rOPINIRole (REQUIP) 1 MG tablet TAKE 1 TO 2 TABLETS BY MOUTH AT BEDTIME AS NEEDED Patient taking differently: Take 1-2 mg by mouth 2 (two) times daily. 07/21/22   Bosie Clos, MD  rOPINIRole (REQUIP) 2 MG tablet TAKE 1 TABLET BY MOUTH TWICE A DAY AS NEEDED Patient taking differently: Take 2 mg by mouth at bedtime. 09/07/22   Bosie Clos, MD  sucralfate (CARAFATE) 1 g tablet TAKE 1 TABLET BY MOUTH 4 (FOUR) TIMES DAILY - WITH MEALS AND AT BEDTIME. 08/16/22  Bosie Clos, MD  tamsulosin (FLOMAX) 0.4 MG CAPS capsule Take 0.4 mg by mouth  daily. 10/12/20   [provider]      VITAL SIGNS:  Blood pressure (!) 144/66, pulse 67, temperature 99 F (37.2 C), resp. rate 16, height 5\' 10"  (1.778 m), weight 122.5 kg, SpO2 94%.  PHYSICAL EXAMINATION:  Physical Exam  GENERAL:  72 y.o.-year-old Caucasian male patient lying in the bed with no acute distress.  EYES: Pupils equal, round, reactive to light and accommodation. No scleral icterus. Extraocular muscles intact.  HEENT: Head atraumatic, normocephalic. Oropharynx and nasopharynx clear.  NECK:  Supple, no jugular venous distention. No thyroid enlargement, no tenderness.  LUNGS: Diminished bibasilar breath sounds with bibasal crackles.  No use of accessory muscles of respiration.  CARDIOVASCULAR: Regular rate and rhythm, S1, S2 normal. No murmurs, rubs, or gallops.  ABDOMEN: Soft, nondistended, nontender. Bowel sounds present. No organomegaly or mass.  EXTREMITIES: No pedal edema, cyanosis, or clubbing.  NEUROLOGIC: Cranial nerves II through XII are intact. Muscle strength 5/5 in all extremities. Sensation intact. Gait not checked.  PSYCHIATRIC: The patient is alert and oriented x 3.  Normal affect and good eye contact. SKIN: No obvious rash, lesion, or ulcer.   LABORATORY PANEL:   CBC Recent Labs  Lab 10/13/23 1315  WBC 9.1  HGB 13.4  HCT 43.3  PLT 103*   ------------------------------------------------------------------------------------------------------------------  Chemistries  Recent Labs  Lab 10/13/23 1315  NA 139  K 4.4  CL 100  CO2 30  GLUCOSE 151*  BUN 31*  CREATININE 1.32*  CALCIUM 8.5*   ------------------------------------------------------------------------------------------------------------------  Cardiac Enzymes No results for input(s): "TROPONINI" in the last 168 hours. ------------------------------------------------------------------------------------------------------------------  RADIOLOGY:  CT Angio Chest PE W and/or Wo  Contrast  Result Date: 10/13/2023 CLINICAL DATA:  Hypoxia.  Chest pain. EXAM: CT ANGIOGRAPHY CHEST WITH CONTRAST TECHNIQUE: Multidetector CT imaging of the chest was performed using the standard protocol during bolus administration of intravenous contrast. Multiplanar CT image reconstructions and MIPs were obtained to evaluate the vascular anatomy. RADIATION DOSE REDUCTION: This exam was performed according to the departmental dose-optimization program which includes automated exposure control, adjustment of the mA and/or kV according to patient size and/or use of iterative reconstruction technique. CONTRAST:  75mL OMNIPAQUE IOHEXOL 350 MG/ML SOLN COMPARISON:  May 27, 2023. FINDINGS: Cardiovascular: Satisfactory opacification of the pulmonary arteries to the segmental level. No evidence of pulmonary embolism. Normal heart size. No pericardial effusion. Mediastinum/Nodes: No enlarged mediastinal, hilar, or axillary lymph nodes. Thyroid gland, trachea, and esophagus demonstrate no significant findings. Lungs/Pleura: No pneumothorax or pleural effusion is noted. Bilateral lower lobe and right middle lobe airspace opacities are noted concerning for edema or pneumonia. Upper Abdomen: No acute abnormality. Musculoskeletal: No chest wall abnormality. No acute or significant osseous findings. Review of the MIP images confirms the above findings. IMPRESSION: No definite evidence of pulmonary embolus. Bilateral lower lobe and right middle lobe airspace opacities are noted concerning for edema or pneumonia. Aortic Atherosclerosis (ICD10-I70.0). Electronically Signed   By: Lupita Raider M.D.   On: 10/13/2023 18:47   DG Chest 2 View  Result Date: 10/13/2023 CLINICAL DATA:  Chest pain and hematemesis. EXAM: CHEST - 2 VIEW COMPARISON:  05/27/2023 FINDINGS: Stable mild cardiac enlargement. Bibasilar opacities present, right greater than left. This is concerning for acute pneumonia, especially on the right. Aspiration is  not excluded. No overt edema, pleural fluid or pneumothorax. Visualized bony structures are unremarkable. IMPRESSION: Bibasilar opacities, right greater than left. This  is concerning for acute pneumonia, especially on the right. Aspiration is not excluded. Electronically Signed   By: Irish Lack M.D.   On: 10/13/2023 15:33      IMPRESSION AND PLAN:  Assessment and Plan: * CAP (community acquired pneumonia) - This is bibasal and right midlung community acquired pneumonia likely bacterial. - The patient has hemoptysis likely secondary to his pneumonia. - The patient will be admitted to a medical telemetry bed. - Will continue antibiotic therapy with IV Rocephin and Zithromax. - Mucolytic therapy be provided as well as duo nebs q.i.d. and q.4 hours p.r.n. - We will follow blood cultures. - Pulmonary consult will be obtained in AM. - I notified Dr. Karna Christmas about the patient.   Acute respiratory failure with hypoxia (HCC) - O2 protocol will be followed. - Management otherwise as above.  Type 2 diabetes mellitus with diabetic polyneuropathy, with long-term current use of insulin (HCC) - The patient will be placed on supplement coverage with NovoLog. - We will continue basal coverage. - We will continue Januvia. - We will continue Lyrica.  GERD without esophagitis - We will continue Carafate.  Paroxysmal atrial fibrillation (HCC) - This associated with atrial flutter as well with variable block. - Will continue amiodarone and Eliquis.  Chronic diastolic CHF (congestive heart failure) (HCC) - We will continue Lasix and Farxiga.   DVT prophylaxis: Eliquis. Advanced Care Planning:  Code Status: full code.  Family Communication:  The plan of care was discussed in details with the patient (and family). I answered all questions. The patient agreed to proceed with the above mentioned plan. Further management will depend upon hospital course. Disposition Plan: Back to previous home  environment Consults called: none.  All the records are reviewed and case discussed with ED provider.  Status is: Inpatient At the time of the admission, it appears that the appropriate admission status for this patient is inpatient.  This is judged to be reasonable and necessary in order to provide the required intensity of service to ensure the patient's safety given the presenting symptoms, physical exam findings and initial radiographic and laboratory data in the context of comorbid conditions.  The patient requires inpatient status due to high intensity of service, high risk of further deterioration and high frequency of surveillance required.  I certify that at the time of admission, it is my clinical judgment that the patient will require inpatient hospital care extending more than 2 midnights.                            Dispo: The patient is from: Home              Anticipated d/c is to: Home              Patient currently is not medically stable to d/c.              Difficult to place patient: No  Hannah Beat M.D on 10/14/2023 at 2:36 AM  [facility]   From 7 PM-7 AM, contact night-coverage [email or web address]  CC: Primary care physician; Bosie Clos, MD

## 2023-10-13 NOTE — Assessment & Plan Note (Signed)
-   We will continue Lasix and Farxiga.

## 2023-10-13 NOTE — Progress Notes (Signed)
PHARMACIST - PHYSICIAN ORDER COMMUNICATION  CONCERNING: P&T Medication Policy on Herbal Medications  DESCRIPTION:  This patient's order for:  Apoaequorin  has been noted.  This product(s) is classified as an "herbal" or natural product. Due to a lack of definitive safety studies or FDA approval, nonstandard manufacturing practices, plus the potential risk of unknown drug-drug interactions while on inpatient medications, the Pharmacy and Therapeutics Committee does not permit the use of "herbal" or natural products of this type within [facility]   ACTION TAKEN: The pharmacy department is unable to verify this order at this time and your patient has been informed of this safety policy. Please reevaluate patient's clinical condition at discharge and address if the herbal or natural product(s) should be resumed at that time.

## 2023-10-14 DIAGNOSIS — J189 Pneumonia, unspecified organism: Secondary | ICD-10-CM | POA: Diagnosis not present

## 2023-10-14 LAB — BASIC METABOLIC PANEL
Anion gap: 11 (ref 5–15)
BUN: 29 mg/dL — ABNORMAL HIGH (ref 8–23)
CO2: 30 mmol/L (ref 22–32)
Calcium: 8.1 mg/dL — ABNORMAL LOW (ref 8.9–10.3)
Chloride: 100 mmol/L (ref 98–111)
Creatinine, Ser: 1.2 mg/dL (ref 0.61–1.24)
GFR, Estimated: >60 mL/min (ref >60)
Glucose, Bld: 124 mg/dL — ABNORMAL HIGH (ref 70–99)
Potassium: 4.2 mmol/L (ref 3.5–5.1)
Sodium: 141 mmol/L (ref 135–145)

## 2023-10-14 LAB — CBC
HCT: 36.5 % — ABNORMAL LOW (ref 39.0–52.0)
Hemoglobin: 11.2 g/dL — ABNORMAL LOW (ref 13.0–17.0)
MCH: 26.4 pg (ref 26.0–34.0)
MCHC: 30.7 g/dL (ref 30.0–36.0)
MCV: 86.1 fL (ref 80.0–100.0)
Platelets: 83 10*3/uL — ABNORMAL LOW (ref 150–400)
RBC: 4.24 MIL/uL (ref 4.22–5.81)
RDW: 15.7 % — ABNORMAL HIGH (ref 11.5–15.5)
WBC: 7.1 10*3/uL (ref 4.0–10.5)
nRBC: 0.4 % — ABNORMAL HIGH (ref 0.0–0.2)

## 2023-10-14 LAB — CBG MONITORING, ED: Glucose-Capillary: 145 mg/dL — ABNORMAL HIGH (ref 70–99)

## 2023-10-14 LAB — GLUCOSE, CAPILLARY: Glucose-Capillary: 101 mg/dL — ABNORMAL HIGH (ref 70–99)

## 2023-10-14 MED ORDER — IPRATROPIUM-ALBUTEROL 0.5-2.5 (3) MG/3ML IN SOLN
3.0000 mL | Freq: Three times a day (TID) | RESPIRATORY_TRACT | Status: DC
  Administered 2023-10-14 – 2023-10-15 (×2): 3 mL via RESPIRATORY_TRACT
  Filled 2023-10-14 (×2): qty 3

## 2023-10-14 MED ORDER — POLYETHYLENE GLYCOL 3350 17 G PO PACK
17.0000 g | PACK | Freq: Every day | ORAL | Status: DC
  Administered 2023-10-15: 17 g via ORAL
  Filled 2023-10-14 (×2): qty 1

## 2023-10-14 NOTE — ED Notes (Signed)
Pt admitted and handoff complete.

## 2023-10-14 NOTE — Progress Notes (Signed)
PROGRESS NOTE    Jerry Parsons  AVW:[id number] DOB: [birth year] DOA: 10/13/2023 PCP: Bosie Clos, MD  Assessment & Plan:   Principal Problem:   CAP (community acquired pneumonia) Active Problems:   Acute respiratory failure with hypoxia (HCC)   Type 2 diabetes mellitus with diabetic polyneuropathy, with long-term current use of insulin (HCC)   Chronic diastolic CHF (congestive heart failure) (HCC)   Paroxysmal atrial fibrillation (HCC)   GERD without esophagitis  Assessment and Plan: CAP: continue on IV rocephin, azithromycin, bronchodilators & encourage incentive spirometry. Continue on supplemental oxygen and wean as tolerated    Acute hypoxic respiratory failure: continue on supplemental oxygen and wean as tolerated   DM2: likely poorly controlled. Holding [place]. Continue on glargine, farxiga   Peripheral neuropathy: continue on home dose of gabapentin    GERD: continue on home dose of carafate    PAF: continue on amio, eliquis   Chronic diastolic CHF: continue on lasix. Monitor I/Os.   Obesity: BMI 38.7. Would benefit from weight loss  Thrombocytopenia: etiology unclear. Will continue to monitor         DVT prophylaxis: eliquis  Code Status: full  Family Communication:  Disposition Plan: likely d/c back home   Level of care: Telemetry Medical Status is: Inpatient Remains inpatient appropriate because: severity of illness    Consultants:    Procedures:   Antimicrobials: rocephin, azithromycin    Subjective: Pt c/o malaise   Objective: Vitals:   10/14/23 0400 10/14/23 0600 10/14/23 0814 10/14/23 0904  BP: 137/64 131/68  (!) 170/71  Pulse: 67 66  67  Resp: 13 11  20   Temp:   99 F (37.2 C) 98.5 F (36.9 C)  TempSrc:   Oral Oral  SpO2: 95% 95%  92%  Weight:      Height:        Intake/Output Summary (Last 24 hours) at 10/14/2023 0917 Last data filed at 10/14/2023 0717 Gross per 24 hour  Intake 353.09 ml  Output --  Net 353.09  ml   Filed Weights   10/13/23 1312  Weight: 122.5 kg    Examination:  General exam: Appears calm and comfortable  Respiratory system: diminished breath sounds b/l  Cardiovascular system: S1 & S2 +. No  rubs, gallops or clicks.  Gastrointestinal system: Abdomen is obese, soft and nontender. Normal bowel sounds heard. Central nervous system: Alert and oriented. Moves all extremities  Psychiatry: Judgement and insight appear normal. Mood & affect appropriate.     Data Reviewed: I have personally reviewed following labs and imaging studies  CBC: Recent Labs  Lab 10/13/23 1315 10/14/23 0449  WBC 9.1 7.1  HGB 13.4 11.2*  HCT 43.3 36.5*  MCV 86.4 86.1  PLT 103* 83*   Basic Metabolic Panel: Recent Labs  Lab 10/13/23 1315 10/14/23 0449  NA 139 141  K 4.4 4.2  CL 100 100  CO2 30 30  GLUCOSE 151* 124*  BUN 31* 29*  CREATININE 1.32* 1.20  CALCIUM 8.5* 8.1*   GFR: Estimated Creatinine Clearance: 72 mL/min (by C-G formula based on SCr of 1.2 mg/dL). Liver Function Tests: No results for input(s): "AST", "ALT", "ALKPHOS", "BILITOT", "PROT", "ALBUMIN" in the last 168 hours. No results for input(s): "LIPASE", "AMYLASE" in the last 168 hours. No results for input(s): "AMMONIA" in the last 168 hours. Coagulation Profile: Recent Labs  Lab 10/13/23 1315  INR 1.5*   Cardiac Enzymes: No results for input(s): "CKTOTAL", "CKMB", "CKMBINDEX", "TROPONINI" in the last 168 hours. BNP (  last 3 results) No results for input(s): "PROBNP" in the last 8760 hours. HbA1C: No results for input(s): "HGBA1C" in the last 72 hours. CBG: Recent Labs  Lab 10/14/23 0044 10/14/23 0905  GLUCAP 145* 101*   Lipid Profile: No results for input(s): "CHOL", "HDL", "LDLCALC", "TRIG", "CHOLHDL", "LDLDIRECT" in the last 72 hours. Thyroid Function Tests: No results for input(s): "TSH", "T4TOTAL", "FREET4", "T3FREE", "THYROIDAB" in the last 72 hours. Anemia Panel: No results for input(s):  "VITAMINB12", "FOLATE", "FERRITIN", "TIBC", "IRON", "RETICCTPCT" in the last 72 hours. Sepsis Labs: Recent Labs  Lab 10/13/23 1647 10/13/23 1910  LATICACIDVEN 1.0 0.6    Recent Results (from the past 240 hour(s))  Blood culture (routine x 2)     Status: None (Preliminary result)   Collection Time: 10/13/23  4:47 PM   Specimen: BLOOD  Result Value Ref Range Status   Specimen Description BLOOD BLOOD RIGHT ARM  Final   Special Requests   Final    BOTTLES DRAWN AEROBIC AND ANAEROBIC Blood Culture adequate volume   Culture   Final    NO GROWTH < 12 HOURS Performed at [facility], [address], [place], [place] [address]    Report Status PENDING  Incomplete  Blood culture (routine x 2)     Status: None (Preliminary result)   Collection Time: 10/13/23  4:47 PM   Specimen: BLOOD  Result Value Ref Range Status   Specimen Description BLOOD BLOOD RIGHT FOREARM  Final   Special Requests   Final    BOTTLES DRAWN AEROBIC AND ANAEROBIC Blood Culture results may not be optimal due to an excessive volume of blood received in culture bottles   Culture   Final    NO GROWTH < 12 HOURS Performed at [facility], [address] [place], [place] [address]    Report Status PENDING  Incomplete  Resp panel by RT-PCR (RSV, Flu A&B, Covid) Anterior Nasal Swab     Status: None   Collection Time: 10/13/23  6:47 PM   Specimen: Anterior Nasal Swab  Result Value Ref Range Status   SARS Coronavirus 2 by RT PCR NEGATIVE NEGATIVE Final    Comment: (NOTE) SARS-CoV-2 target nucleic acids are NOT DETECTED.  The SARS-CoV-2 RNA is generally detectable in upper respiratory specimens during the acute phase of infection. The lowest concentration of SARS-CoV-2 viral copies this assay can detect is 138 copies/mL. A negative result does not preclude SARS-Cov-2 infection and should not be used as the sole basis for treatment or other patient management decisions. A negative result may occur  with  improper specimen collection/handling, submission of specimen other than nasopharyngeal swab, presence of viral mutation(s) within the areas targeted by this assay, and inadequate number of viral copies(<138 copies/mL). A negative result must be combined with clinical observations, patient history, and epidemiological information. The expected result is Negative.  Fact Sheet for Patients:  BloggerCourse.com  Fact Sheet for Healthcare Providers:  SeriousBroker.it  This test is no t yet approved or cleared by the [place] FDA and  has been authorized for detection and/or diagnosis of SARS-CoV-2 by FDA under an Emergency Use Authorization (EUA). This EUA will remain  in effect (meaning this test can be used) for the duration of the COVID-19 declaration under Section 564(b)(1) of the Act, 21 U.S.C.section 360bbb-3(b)(1), unless the authorization is terminated  or revoked sooner.       Influenza A by PCR NEGATIVE NEGATIVE Final   Influenza B by PCR NEGATIVE NEGATIVE Final  Comment: (NOTE) The Xpert Xpress SARS-CoV-2/FLU/RSV plus assay is intended as an aid in the diagnosis of influenza from Nasopharyngeal swab specimens and should not be used as a sole basis for treatment. Nasal washings and aspirates are unacceptable for Xpert Xpress SARS-CoV-2/FLU/RSV testing.  Fact Sheet for Patients: BloggerCourse.com  Fact Sheet for Healthcare Providers: SeriousBroker.it  This test is not yet approved or cleared by the [place] FDA and has been authorized for detection and/or diagnosis of SARS-CoV-2 by FDA under an Emergency Use Authorization (EUA). This EUA will remain in effect (meaning this test can be used) for the duration of the COVID-19 declaration under Section 564(b)(1) of the Act, 21 U.S.C. section 360bbb-3(b)(1), unless the authorization is terminated  or revoked.     Resp Syncytial Virus by PCR NEGATIVE NEGATIVE Final    Comment: (NOTE) Fact Sheet for Patients: BloggerCourse.com  Fact Sheet for Healthcare Providers: SeriousBroker.it  This test is not yet approved or cleared by the [place] FDA and has been authorized for detection and/or diagnosis of SARS-CoV-2 by FDA under an Emergency Use Authorization (EUA). This EUA will remain in effect (meaning this test can be used) for the duration of the COVID-19 declaration under Section 564(b)(1) of the Act, 21 U.S.C. section 360bbb-3(b)(1), unless the authorization is terminated or revoked.  Performed at [facility], [address], [place], [place] [address]          Radiology Studies: CT Angio Chest PE W and/or Wo Contrast  Result Date: 10/13/2023 CLINICAL DATA:  Hypoxia.  Chest pain. EXAM: CT ANGIOGRAPHY CHEST WITH CONTRAST TECHNIQUE: Multidetector CT imaging of the chest was performed using the standard protocol during bolus administration of intravenous contrast. Multiplanar CT image reconstructions and MIPs were obtained to evaluate the vascular anatomy. RADIATION DOSE REDUCTION: This exam was performed according to the departmental dose-optimization program which includes automated exposure control, adjustment of the mA and/or kV according to patient size and/or use of iterative reconstruction technique. CONTRAST:  75mL OMNIPAQUE IOHEXOL 350 MG/ML SOLN COMPARISON:  May 27, 2023. FINDINGS: Cardiovascular: Satisfactory opacification of the pulmonary arteries to the segmental level. No evidence of pulmonary embolism. Normal heart size. No pericardial effusion. Mediastinum/Nodes: No enlarged mediastinal, hilar, or axillary lymph nodes. Thyroid gland, trachea, and esophagus demonstrate no significant findings. Lungs/Pleura: No pneumothorax or pleural effusion is noted. Bilateral lower lobe and right middle lobe airspace  opacities are noted concerning for edema or pneumonia. Upper Abdomen: No acute abnormality. Musculoskeletal: No chest wall abnormality. No acute or significant osseous findings. Review of the MIP images confirms the above findings. IMPRESSION: No definite evidence of pulmonary embolus. Bilateral lower lobe and right middle lobe airspace opacities are noted concerning for edema or pneumonia. Aortic Atherosclerosis (ICD10-I70.0). Electronically Signed   By: Lupita Raider M.D.   On: 10/13/2023 18:47   DG Chest 2 View  Result Date: 10/13/2023 CLINICAL DATA:  Chest pain and hematemesis. EXAM: CHEST - 2 VIEW COMPARISON:  05/27/2023 FINDINGS: Stable mild cardiac enlargement. Bibasilar opacities present, right greater than left. This is concerning for acute pneumonia, especially on the right. Aspiration is not excluded. No overt edema, pleural fluid or pneumothorax. Visualized bony structures are unremarkable. IMPRESSION: Bibasilar opacities, right greater than left. This is concerning for acute pneumonia, especially on the right. Aspiration is not excluded. Electronically Signed   By: Irish Lack M.D.   On: 10/13/2023 15:33        Scheduled Meds:  amiodarone  200 mg Oral Daily   apixaban  5 mg Oral BID   aspirin EC  81 mg Oral Daily   dapagliflozin propanediol  10 mg Oral Daily   enoxaparin (LOVENOX) injection  0.5 mg/kg Subcutaneous Q24H   ferrous sulfate  325 mg Oral Daily   furosemide  40 mg Oral Daily   gabapentin  300 mg Oral Q0600   guaiFENesin  600 mg Oral BID   insulin glargine-yfgn  40 Units Subcutaneous Daily   ipratropium-albuterol  3 mL Nebulization QID   linaclotide  145 mcg Oral QAC breakfast   linagliptin  5 mg Oral Daily   loratadine  10 mg Oral Daily   magnesium oxide  400 mg Oral BID   meloxicam  7.5 mg Oral Daily   metoprolol tartrate  100 mg Oral BID   omega-3 acid ethyl esters  1 g Oral BID   rOPINIRole  2 mg Oral QHS   sucralfate  1 g Oral TID WC & HS   tamsulosin   0.4 mg Oral Daily   Continuous Infusions:  azithromycin     cefTRIAXone (ROCEPHIN)  IV     cefTRIAXone (ROCEPHIN)  IV     lactated ringers 40 mL/hr at 10/14/23 0717     LOS: 1 day       Charise Killian, MD [facility] Pager [phone]  If 7PM-7AM, please contact night-coverage [email or web address] 10/14/2023, 9:17 AM

## 2023-10-14 NOTE — Progress Notes (Signed)
   10/14/23 0904  Vitals  Temp 98.5 F (36.9 C)  Temp Source Oral  BP (!) 170/71  MAP (mmHg) 100  BP Location Left Arm  BP Method Automatic  Patient Position (if appropriate) Sitting  Pulse Rate 67  Pulse Rate Source Monitor  Resp 20  Oxygen Therapy  SpO2 92 %  MEWS Score  MEWS Temp 0  MEWS Systolic 0  MEWS Pulse 0  MEWS RR 0  MEWS LOC 0  MEWS Score 0  MEWS Score Color Green     Patient arrived to the unit comfort afforded.VSS, pan assessed. Primary RN informed.

## 2023-10-14 NOTE — ED Notes (Signed)
Pt co 8/10 back pain states it is chronic pain, meds given as ordered.

## 2023-10-14 NOTE — ED Notes (Signed)
Ice cold water given at this time.

## 2023-10-14 NOTE — Plan of Care (Signed)
CHL Tonsillectomy/Adenoidectomy, Postoperative PEDS care plan entered in error.

## 2023-10-15 DIAGNOSIS — J189 Pneumonia, unspecified organism: Secondary | ICD-10-CM | POA: Diagnosis not present

## 2023-10-15 LAB — CBC
HCT: 37 % — ABNORMAL LOW (ref 39.0–52.0)
Hemoglobin: 11.4 g/dL — ABNORMAL LOW (ref 13.0–17.0)
MCH: 26 pg (ref 26.0–34.0)
MCHC: 30.8 g/dL (ref 30.0–36.0)
MCV: 84.3 fL (ref 80.0–100.0)
Platelets: 103 10*3/uL — ABNORMAL LOW (ref 150–400)
RBC: 4.39 MIL/uL (ref 4.22–5.81)
RDW: 15.5 % (ref 11.5–15.5)
WBC: 5.9 10*3/uL (ref 4.0–10.5)
nRBC: 0 % (ref 0.0–0.2)

## 2023-10-15 LAB — BASIC METABOLIC PANEL
Anion gap: 11 (ref 5–15)
BUN: 27 mg/dL — ABNORMAL HIGH (ref 8–23)
CO2: 29 mmol/L (ref 22–32)
Calcium: 8 mg/dL — ABNORMAL LOW (ref 8.9–10.3)
Chloride: 100 mmol/L (ref 98–111)
Creatinine, Ser: 1.26 mg/dL — ABNORMAL HIGH (ref 0.61–1.24)
GFR, Estimated: >60 mL/min (ref >60)
Glucose, Bld: 179 mg/dL — ABNORMAL HIGH (ref 70–99)
Potassium: 4.1 mmol/L (ref 3.5–5.1)
Sodium: 140 mmol/L (ref 135–145)

## 2023-10-15 MED ORDER — AZITHROMYCIN 500 MG PO TABS: 500.0000 mg | ORAL_TABLET | Freq: Every day | ORAL | Status: DC

## 2023-10-15 MED ORDER — IPRATROPIUM-ALBUTEROL 0.5-2.5 (3) MG/3ML IN SOLN: 3.0000 mL | RESPIRATORY_TRACT | Status: DC | PRN

## 2023-10-15 MED ORDER — AZITHROMYCIN 500 MG PO TABS: 500.0000 mg | ORAL_TABLET | Freq: Every day | ORAL | 0 refills | Status: AC

## 2023-10-15 NOTE — TOC Initial Note (Signed)
Transition of Care [facility]) - Initial/Assessment Note    Patient Details  Name: Jerry Parsons MRN: [id number] Date of Birth: 12-Jan-1951  Transition of Care [facility]) CM/SW Contact:    Liliana Cline, LCSW Phone Number: 10/15/2023, 12:57 PM  Clinical Narrative:                 CSW met with patient and spouse at bedside. Patient is fro home with spouse. Patient drives. PCP is Dr. Sullivan Lone. [facility] Patient had HH in the past (unsure of agency) and also went to OPPT at [facility] in the past. Patient has access to a walker, cane, 3in1, and wheelchair at home. Patient denies TOC needs.  Expected Discharge Plan: Home/Self Care Barriers to Discharge: Continued Medical Work up   Patient Goals and CMS Choice Patient states their goals for this hospitalization and ongoing recovery are:: to return home with wife CMS Medicare.gov Compare Post Acute Care list provided to:: Patient Choice offered to / list presented to : Patient      Expected Discharge Plan and Services       Living arrangements for the past 2 months: Single Family Home                                      Prior Living Arrangements/Services Living arrangements for the past 2 months: Single Family Home Lives with:: Spouse Patient language and need for interpreter reviewed:: Yes Do you feel safe going back to the place where you live?: Yes      Need for Family Participation in Patient Care: Yes (Comment) Care giver support system in place?: Yes (comment) Current home services: DME Criminal Activity/Legal Involvement Pertinent to Current Situation/Hospitalization: No - Comment as needed  Activities of Daily Living   ADL Screening (condition at time of admission) Independently performs ADLs?: Yes (appropriate for developmental age) Is the patient deaf or have difficulty hearing?: No Does the patient have difficulty seeing, even when wearing glasses/contacts?: No Does the patient have  difficulty concentrating, remembering, or making decisions?: Yes  Permission Sought/Granted Permission sought to share information with : Family Supports, Oceanographer granted to share information with : Yes, Verbal Permission Granted     Permission granted to share info w AGENCY: as needed  Permission granted to share info w Relationship: spouse     Emotional Assessment       Orientation: : Oriented to Self, Oriented to Situation, Oriented to Place, Oriented to  Time Alcohol / Substance Use: Not Applicable Psych Involvement: No (comment)  Admission diagnosis:  CAP (community acquired pneumonia) [J18.9] Acute respiratory failure with hypoxia (HCC) [J96.01] Community acquired pneumonia, unspecified laterality [J18.9] Patient Active Problem List   Diagnosis Date Noted   CAP (community acquired pneumonia) 10/13/2023   Acute respiratory failure with hypoxia (HCC) 10/13/2023   GERD without esophagitis 10/13/2023   Olecranon bursitis of right elbow 12/29/2022   Type 2 diabetes mellitus with diabetic polyneuropathy, with long-term current use of insulin (HCC) 12/28/2022   Respiratory tract infection 12/17/2021   Aortic atherosclerosis (HCC) 10/09/2021   Carpal tunnel syndrome of left wrist 09/01/2021   Acquired trigger finger of left middle finger 05/28/2021   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   Primary hyperparathyroidism (HCC)    Osteoarthritis of both knees 11/11/2018   OSA on CPAP 10/14/2018   Lumbar spondylosis 05/05/2018   Spinal stenosis of lumbar region  05/05/2018   Constipation, chronic 03/08/2018   Pain in limb 03/03/2018   Gastroesophageal reflux disease without esophagitis 01/04/2018   OA (osteoarthritis) of knee 06/20/2017   Sepsis due to pneumonia (HCC) 05/07/2017   Neuropathy 10/13/2016   Amblyopia 12/30/2015   Cornea scar 12/30/2015   NS (nuclear sclerosis) 12/30/2015   Pseudophakia 12/30/2015   Dehydration 09/08/2015   Aspiration  pneumonia (HCC) 07/04/2015   Paroxysmal atrial fibrillation (HCC) 07/04/2015   Arthritis of knee, degenerative 05/28/2015   Chronic back pain 09/17/2014   Leg edema 05/07/2014   Chronic diastolic CHF (congestive heart failure) (HCC) 05/07/2014   Campylobacter diarrhea 04/25/2013   Atrial flutter (HCC) 01/23/2013   Obesity 05/19/2012   Hyperlipidemia 11/11/2011   Diastolic dysfunction 04/12/2011   SOB (shortness of breath) 04/12/2011   Diabetes mellitus type 2, uncontrolled, with complications 03/15/2011   HYPERTENSION, BENIGN 03/15/2011   Acute thromboembolism of deep veins of lower extremity (HCC) 03/15/2011   Symptoms involving cardiovascular system 03/15/2011   PCP:  Bosie Clos, MD [facility]   [phone] [place] [facility], [facility] - [address] [place] [address] Phone: [phone] Fax: [phone]  [facility] [place], [place] - [address] [facility] AT Portal to Registered [facility] [address] [place] [address] Phone: [phone] Fax: [phone]  [phone] [place], [place] - [address] [place] [address] Phone: [phone] Fax: [phone]  [facility] [phone] [facility] - [place], [place] - [address] [place] [address] Phone: [phone] Fax: [phone]     Social Determinants of Health (SDOH) Social History: SDOH Screenings   Food Insecurity: Patient Declined (09/03/2023)   [facility]  Transportation Needs: No Transportation Needs (10/05/2018)  Alcohol Screen: Low Risk  (12/29/2022)  Depression (PHQ2-9): Low Risk  (12/29/2022)  Financial Resource Strain: Low Risk  (09/03/2023)   Received from [facility]  Physical Activity: Inactive (10/31/2019)  Social Connections: Unknown (10/05/2018)  Stress: No Stress Concern Present (10/31/2019)  Tobacco Use: Medium  Risk (10/12/2023)   Received from [facility]   SDOH Interventions:     Readmission Risk Interventions    10/15/2023   12:57 PM  Readmission Risk Prevention Plan  Transportation Screening Complete  PCP or Specialist Appt within 3-5 Days Complete  HRI or Home Care Consult Complete  Social Work Consult for Recovery Care Planning/Counseling Complete  Palliative Care Screening Not Applicable  Medication Review Oceanographer) Complete

## 2023-10-15 NOTE — Plan of Care (Signed)
Pt state that he feels his sugar is low. No symptoms were described to RN. No visible symptoms of hypertension or hypotension noted. No sweatiness, no clamminess, no tremors  were noted. Pt was given crackers and peanut butter immediately as he only stated "I know how I feel. I feel this way when I feel this way". Crackers and peanut butter was given to patient.  Pt called immediately stating that he was feeling nauseous. Zofran was taken in the room immediately but he insisted to have blood sugars done. Blood sugars were checked when patient first time used the word "queasy". Pt denied dizziness, headache or SOB. Blood sugar was shown at 133 and pt was given Zofran.   Problem: Activity: Goal: Ability to tolerate increased activity will improve Outcome: Progressing   Problem: Clinical Measurements: Goal: Ability to maintain a body temperature in the normal range will improve Outcome: Progressing   Problem: Health Behavior/Discharge Planning: Goal: Ability to manage health-related needs will improve Outcome: Progressing   Problem: Clinical Measurements: Goal: Ability to maintain clinical measurements within normal limits will improve Outcome: Progressing Goal: Will remain free from infection Outcome: Progressing Goal: Diagnostic test results will improve Outcome: Progressing Goal: Respiratory complications will improve Outcome: Progressing Goal: Cardiovascular complication will be avoided Outcome: Progressing   Problem: Elimination: Goal: Will not experience complications related to urinary retention Outcome: Progressing   Problem: Safety: Goal: Ability to remain free from injury will improve Outcome: Progressing   Problem: Skin Integrity: Goal: Risk for impaired skin integrity will decrease Outcome: Progressing

## 2023-10-15 NOTE — Progress Notes (Signed)
PHARMACIST - PHYSICIAN COMMUNICATION  CONCERNING: Antibiotic IV to Oral Route Change Policy  RECOMMENDATION: This patient is receiving azithromycin by the intravenous route.  Based on criteria approved by the Pharmacy and Therapeutics Committee, the antibiotic(s) is/are being converted to the equivalent oral dose form(s).  DESCRIPTION: These criteria include: Patient being treated for a respiratory tract infection, urinary tract infection, cellulitis or clostridium difficile associated diarrhea if on metronidazole The patient is not neutropenic and does not exhibit a GI malabsorption state The patient is eating (either orally or via tube) and/or has been taking other orally administered medications for a least 24 hours The patient is improving clinically and has a Tmax < 100.5  If you have questions about this conversion, please contact the Pharmacy Department   Tressie Ellis 10/15/23

## 2023-10-15 NOTE — Plan of Care (Signed)

## 2023-10-15 NOTE — Progress Notes (Signed)
AVS instructions reviewed, all questions answered. All personal belongings were returned.

## 2023-10-15 NOTE — Discharge Summary (Signed)
Physician Discharge Summary  Jerry Parsons ZOX:[id number] DOB: April 09, 1951 DOA: 10/13/2023  PCP: Bosie Clos, MD  Admit date: 10/13/2023 Discharge date: 10/15/2023  Admitted From: home  Disposition:  home   Recommendations for Outpatient Follow-up:  Follow up with PCP in 1-2 weeks  Home Health: no  Equipment/Devices:  Discharge Condition: stable  CODE STATUS: full  Diet recommendation: Heart Healthy / Carb Modified  Brief/Interim Summary: HPI was taken from Dr. Arville Care: Jerry Parsons is a 72 y.o. 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  He was noted to be hypoxic with a pulse oximetry of 87% on room air requiring 2 L of O2 by nasal cannula.  He denied any fever or chills.  No nausea or vomiting or abdominal pain.  No chest pain or palpitations.  No dysuria, oliguria or hematuria or flank pain.   ED Course: Upon presentation to the emergency room, BP was 165/55 with a temperature of 99.2 with otherwise normal vital signs.  Labs revealed a BUN of 31 and creatinine of 1.32 and blood Kos of 151.  High sensitive troponin I was 9 and later 8.  Lactic acid was 1 and later 0.6.  CBC showed thrombocytopenia of 103.  PT was 18 and INR 1.5.  Blood group was O+ with negative antibody screen EKG as reviewed by me : EKG showed atrial flutter with variable AV block and RSR'.   Imaging: Two-view chest x-ray showed bibasilar opacities, right greater than left, concerning for acute pneumonia especially on the right and if aspiration is not excluded. Chest CTA revealed no evidence for PE.  It showed bilateral lower  lobe and right middle lobe airspace opacities concerning for edema or pneumonia and aortic atherosclerosis.   The patient was given IV Zithromax and Rocephin and was placed on 2 L of O2 by nasal cannula as mentioned above with pulse oximetry of 95-96%.  She will be admitted to a medical telemetry bed for further evaluation and management.     Discharge Diagnoses:  Principal Problem:   CAP (community acquired pneumonia) Active Problems:   Acute respiratory failure with hypoxia (HCC)   Type 2 diabetes mellitus with diabetic polyneuropathy, with long-term current use of insulin (HCC)   Chronic diastolic CHF (congestive heart failure) (HCC)   Paroxysmal atrial fibrillation (HCC)   GERD without esophagitis  CAP: continue on IV rocephin, azithromycin, bronchodilators & encourage incentive spirometry. Weaned off of supplemental oxygen    Acute hypoxic respiratory failure: weaned off of supplemental oxygen. Resolved   DM2: likely poorly controlled. Holding [place]. Continue on glargine, farxiga   Peripheral neuropathy: continue on home dose of gabapentin    GERD: continue on home dose of carafate    PAF: continue on amio, eliquis   Chronic diastolic CHF: continue on lasix. Monitor I/Os.   Obesity: BMI 38.7. Would benefit from weight loss  Thrombocytopenia: etiology unclear. Will continue to monitor     Discharge Instructions  Discharge Instructions     Diet - low sodium heart healthy   Complete by: As directed    Discharge instructions   Complete by: As directed    F/u w/ PCP  in 1-2 weeks   Increase activity slowly   Complete by: As directed       Allergies as of 10/15/2023       Reactions   Carisoprodol Itching        Medication List     STOP taking these medications    meloxicam 7.5 MG tablet Commonly known as: MOBIC       TAKE these medications    amiodarone 200 MG tablet Commonly known as: PACERONE TAKE 1 TABLET BY MOUTH EVERY DAY   aspirin EC 81  MG tablet Take 81 mg by mouth daily. Swallow whole.   azithromycin 500 MG tablet Commonly known as: ZITHROMAX Take 1 tablet (500 mg total) by mouth daily for 3 days.   Basaglar KwikPen 100 UNIT/ML INJECT 40 UNITS            SUBCUTANEOUSLY TWO TIMES A DAY   doxycycline 100 MG capsule Commonly known as: VIBRAMYCIN Take 1 capsule (100 mg total) by mouth 2 (two) times daily.   Eliquis 5 MG Tabs tablet Generic drug: apixaban TAKE 1 TABLET TWICE A DAY   Farxiga 10 MG Tabs tablet Generic drug: dapagliflozin propanediol TAKE 1 TABLET BY MOUTH EVERY DAY   ferrous sulfate 325 (65 FE) MG tablet Take 1 tablet by mouth daily.   fexofenadine 180 MG tablet Commonly known as: ALLEGRA Take 180 mg by mouth daily.   Fish Oil 1200 MG Caps Take 1,200 mg by mouth 2 (two) times daily.   FreeStyle Libre 2 Reader Devi Inject 1 Device into the skin daily. DGX   FreeStyle Libre 2 Sensor Misc 2 Devices by Does not apply route daily.   furosemide 40 MG tablet Commonly known as: LASIX TAKE 1 TABLET BY MOUTH EVERY DAY   gabapentin 300 MG capsule Commonly known as: NEURONTIN Take 1 capsule (300 mg total) by mouth at bedtime. What changed: Another medication with the same name was removed. Continue taking this medication, and follow the directions you see here.   Januvia 100 MG tablet Generic drug: sitaGLIPtin TAKE 1 TABLET DAILY   linaclotide 145 MCG Caps capsule Commonly known as: LINZESS Take 145 mcg by mouth daily before breakfast.   magnesium oxide 400 MG tablet Commonly known as: MAG-OX TAKE 1 TABLET BY MOUTH TWICE A DAY   metoprolol tartrate 100 MG tablet Commonly known as: LOPRESSOR TAKE 1 TABLET BY MOUTH TWICE A DAY   naloxone 4 MG/0.1ML Liqd nasal spray kit Commonly known as: NARCAN Please use in the event of overdose from narcotic medication   NovoLOG FlexPen 100 UNIT/ML FlexPen Generic drug: insulin aspart INJECT 14 UNITS            SUBCUTANEOUSLY IN THE      MORNING  AND AT BEDTIME   nystatin-triamcinolone ointment Commonly known as: MYCOLOG Apply 1 Application topically 2 (two) times daily.   OneTouch Ultra test strip Generic drug: glucose blood USE WITH METER TWICE DAILY   oxyCODONE 40 mg 12 hr tablet Commonly known as: OxyCONTIN Take 1 tablet (40 mg total) by mouth every 8 (eight) hours as needed.   pregabalin 200 MG capsule Commonly known as: LYRICA TAKE 1 CAPSULE 3 TIMES A   DAY   Prevagen 10 MG Caps Generic drug: Apoaequorin Take 10 mg by mouth daily.   rOPINIRole 1 MG tablet Commonly known as: REQUIP TAKE 1 TO 2 TABLETS BY MOUTH AT BEDTIME AS NEEDED What changed: when to take this   rOPINIRole 2 MG tablet Commonly  known as: REQUIP TAKE 1 TABLET BY MOUTH TWICE A DAY AS NEEDED What changed: when to take this   sucralfate 1 g tablet Commonly known as: CARAFATE TAKE 1 TABLET BY MOUTH 4 (FOUR) TIMES DAILY - WITH MEALS AND AT BEDTIME.   tamsulosin 0.4 MG Caps capsule Commonly known as: FLOMAX Take 0.4 mg by mouth daily.        Allergies  Allergen Reactions   Carisoprodol Itching    Consultations:    Procedures/Studies: CT Angio Chest PE W and/or Wo Contrast  Result Date: 10/13/2023 CLINICAL DATA:  Hypoxia.  Chest pain. EXAM: CT ANGIOGRAPHY CHEST WITH CONTRAST TECHNIQUE: Multidetector CT imaging of the chest was performed using the standard protocol during bolus administration of intravenous contrast. Multiplanar CT image reconstructions and MIPs were obtained to evaluate the vascular anatomy. RADIATION DOSE REDUCTION: This exam was performed according to the departmental dose-optimization program which includes automated exposure control, adjustment of the mA and/or kV according to patient size and/or use of iterative reconstruction technique. CONTRAST:  75mL OMNIPAQUE IOHEXOL 350 MG/ML SOLN COMPARISON:  May 27, 2023. FINDINGS: Cardiovascular: Satisfactory opacification of the pulmonary arteries to the segmental level. No  evidence of pulmonary embolism. Normal heart size. No pericardial effusion. Mediastinum/Nodes: No enlarged mediastinal, hilar, or axillary lymph nodes. Thyroid gland, trachea, and esophagus demonstrate no significant findings. Lungs/Pleura: No pneumothorax or pleural effusion is noted. Bilateral lower lobe and right middle lobe airspace opacities are noted concerning for edema or pneumonia. Upper Abdomen: No acute abnormality. Musculoskeletal: No chest wall abnormality. No acute or significant osseous findings. Review of the MIP images confirms the above findings. IMPRESSION: No definite evidence of pulmonary embolus. Bilateral lower lobe and right middle lobe airspace opacities are noted concerning for edema or pneumonia. Aortic Atherosclerosis (ICD10-I70.0). Electronically Signed   By: Lupita Raider M.D.   On: 10/13/2023 18:47   DG Chest 2 View  Result Date: 10/13/2023 CLINICAL DATA:  Chest pain and hematemesis. EXAM: CHEST - 2 VIEW COMPARISON:  05/27/2023 FINDINGS: Stable mild cardiac enlargement. Bibasilar opacities present, right greater than left. This is concerning for acute pneumonia, especially on the right. Aspiration is not excluded. No overt edema, pleural fluid or pneumothorax. Visualized bony structures are unremarkable. IMPRESSION: Bibasilar opacities, right greater than left. This is concerning for acute pneumonia, especially on the right. Aspiration is not excluded. Electronically Signed   By: Irish Lack M.D.   On: 10/13/2023 15:33   US SCROTUM W/DOPPLER  Result Date: 10/07/2023 CLINICAL DATA:  Right scrotal pain. EXAM: SCROTAL ULTRASOUND DOPPLER ULTRASOUND OF THE TESTICLES TECHNIQUE: Complete ultrasound examination of the testicles, epididymis, and other scrotal structures was performed. Color and spectral Doppler ultrasound were also utilized to evaluate blood flow to the testicles. COMPARISON:  CT abdomen and pelvis dated 08/25/2023 FINDINGS: Right testicle Measurements: 2.9 x  6.3 x 2.4 cm. No mass or microlithiasis visualized. Left testicle Measurements: 2.8 x 1.8 x 2.3 cm. An appendix testis measures 0.2 cm. No mass or microlithiasis visualized. Right epididymis: The epididymis is mildly heterogeneous. No hyperemia. Left epididymis: An epididymal head cyst measures 0.6 cm. The epididymis is mildly heterogeneous. No hyperemia. Hydrocele:  Small left hydrocele. Varicocele:  None visualized. Pulsed Doppler interrogation of both testes demonstrates normal low resistance arterial and venous waveforms bilaterally. IMPRESSION: 1. No evidence of testicular torsion. 2. Heterogeneous epididymis on both sides is a nonspecific finding but may reflect mild edema/inflammation. 3. Small left hydrocele. Electronically Signed   By: Foye Spurling.D.  On: 10/07/2023 16:32   (Echo, Carotid, EGD, Colonoscopy, ERCP)    Subjective: pt c/o fatigue    Discharge Exam: Vitals:   10/15/23 0725 10/15/23 0928  BP:  131/64  Pulse:  61  Resp:  17  Temp:  98.4 F (36.9 C)  SpO2: 93% 94%   Vitals:   10/14/23 1635 10/15/23 0037 10/15/23 0725 10/15/23 0928  BP: (!) 140/63 125/61  131/64  Pulse: 65 65  61  Resp: 18 18  17   Temp: 98 F (36.7 C) 98.2 F (36.8 C)  98.4 F (36.9 C)  TempSrc: Oral Oral    SpO2: 94% 93% 93% 94%  Weight:      Height:        General: Pt is alert, awake, not in acute distress Cardiovascular: irregularly irregular, no rubs, no gallops Respiratory: decreased breath sounds b/l  Abdominal: Soft, NT, obese, bowel sounds + Extremities: no edema, no cyanosis    The results of significant diagnostics from this hospitalization (including imaging, microbiology, ancillary and laboratory) are listed below for reference.     Microbiology: Recent Results (from the past 240 hour(s))  Blood culture (routine x 2)     Status: None (Preliminary result)   Collection Time: 10/13/23  4:47 PM   Specimen: BLOOD  Result Value Ref Range Status   Specimen Description BLOOD  BLOOD RIGHT ARM  Final   Special Requests   Final    BOTTLES DRAWN AEROBIC AND ANAEROBIC Blood Culture adequate volume   Culture   Final    NO GROWTH 2 DAYS Performed at [facility], [address], [place], [place] [address]    Report Status PENDING  Incomplete  Blood culture (routine x 2)     Status: None (Preliminary result)   Collection Time: 10/13/23  4:47 PM   Specimen: BLOOD  Result Value Ref Range Status   Specimen Description BLOOD BLOOD RIGHT FOREARM  Final   Special Requests   Final    BOTTLES DRAWN AEROBIC AND ANAEROBIC Blood Culture results may not be optimal due to an excessive volume of blood received in culture bottles   Culture   Final    NO GROWTH 2 DAYS Performed at [facility] [address] [place], [place] [address]    Report Status PENDING  Incomplete  Resp panel by RT-PCR (RSV, Flu A&B, Covid) Anterior Nasal Swab     Status: None   Collection Time: 10/13/23  6:47 PM   Specimen: Anterior Nasal Swab  Result Value Ref Range Status   SARS Coronavirus 2 by RT PCR NEGATIVE NEGATIVE Final    Comment: (NOTE) SARS-CoV-2 target nucleic acids are NOT DETECTED.  The SARS-CoV-2 RNA is generally detectable in upper respiratory specimens during the acute phase of infection. The lowest concentration of SARS-CoV-2 viral copies this assay can detect is 138 copies/mL. A negative result does not preclude SARS-Cov-2 infection and should not be used as the sole basis for treatment or other patient management decisions. A negative result may occur with  improper specimen collection/handling, submission of specimen other than nasopharyngeal swab, presence of viral mutation(s) within the areas targeted by this assay, and inadequate number of viral copies(<138 copies/mL). A negative result must be combined with clinical observations, patient history, and epidemiological information. The expected result is Negative.  Fact Sheet for Patients:   BloggerCourse.com  Fact Sheet for Healthcare Providers:  SeriousBroker.it  This test is no t yet approved or cleared by the [place] and  has been authorized for detection and/or diagnosis of SARS-CoV-2 by FDA under an Emergency Use Authorization (EUA). This EUA will remain  in effect (meaning this test can be used) for the duration of the COVID-19 declaration under Section 564(b)(1) of the Act, 21 U.S.C.section 360bbb-3(b)(1), unless the authorization is terminated  or revoked sooner.       Influenza A by PCR NEGATIVE NEGATIVE Final   Influenza B by PCR NEGATIVE NEGATIVE Final    Comment: (NOTE) The Xpert Xpress SARS-CoV-2/FLU/RSV plus assay is intended as an aid in the diagnosis of influenza from Nasopharyngeal swab specimens and should not be used as a sole basis for treatment. Nasal washings and aspirates are unacceptable for Xpert Xpress SARS-CoV-2/FLU/RSV testing.  Fact Sheet for Patients: BloggerCourse.com  Fact Sheet for Healthcare Providers: SeriousBroker.it  This test is not yet approved or cleared by the [place] FDA and has been authorized for detection and/or diagnosis of SARS-CoV-2 by FDA under an Emergency Use Authorization (EUA). This EUA will remain in effect (meaning this test can be used) for the duration of the COVID-19 declaration under Section 564(b)(1) of the Act, 21 U.S.C. section 360bbb-3(b)(1), unless the authorization is terminated or revoked.     Resp Syncytial Virus by PCR NEGATIVE NEGATIVE Final    Comment: (NOTE) Fact Sheet for Patients: BloggerCourse.com  Fact Sheet for Healthcare Providers: SeriousBroker.it  This test is not yet approved or cleared by the [place] FDA and has been authorized for detection and/or diagnosis of SARS-CoV-2 by FDA under an Emergency Use  Authorization (EUA). This EUA will remain in effect (meaning this test can be used) for the duration of the COVID-19 declaration under Section 564(b)(1) of the Act, 21 U.S.C. section 360bbb-3(b)(1), unless the authorization is terminated or revoked.  Performed at [facility], [address] [place], [place] [address]      Labs: BNP (last 3 results) No results for input(s): "BNP" in the last 8760 hours. Basic Metabolic Panel: Recent Labs  Lab 10/13/23 1315 10/14/23 0449 10/15/23 0436  NA 139 141 140  K 4.4 4.2 4.1  CL 100 100 100  CO2 30 30 29   GLUCOSE 151* 124* 179*  BUN 31* 29* 27*  CREATININE 1.32* 1.20 1.26*  CALCIUM 8.5* 8.1* 8.0*   Liver Function Tests: No results for input(s): "AST", "ALT", "ALKPHOS", "BILITOT", "PROT", "ALBUMIN" in the last 168 hours. No results for input(s): "LIPASE", "AMYLASE" in the last 168 hours. No results for input(s): "AMMONIA" in the last 168 hours. CBC: Recent Labs  Lab 10/13/23 1315 10/14/23 0449 10/15/23 0436  WBC 9.1 7.1 5.9  HGB 13.4 11.2* 11.4*  HCT 43.3 36.5* 37.0*  MCV 86.4 86.1 84.3  PLT 103* 83* 103*   Cardiac Enzymes: No results for input(s): "CKTOTAL", "CKMB", "CKMBINDEX", "TROPONINI" in the last 168 hours. BNP: Invalid input(s): "POCBNP" CBG: Recent Labs  Lab 10/14/23 0044 10/14/23 0905  GLUCAP 145* 101*   D-Dimer No results for input(s): "DDIMER" in the last 72 hours. Hgb A1c No results for input(s): "HGBA1C" in the last 72 hours. Lipid Profile No results for input(s): "CHOL", "HDL", "LDLCALC", "TRIG", "CHOLHDL", "LDLDIRECT" in the last 72 hours. Thyroid function studies No results for input(s): "TSH", "T4TOTAL", "T3FREE", "THYROIDAB" in the last 72 hours.  Invalid input(s): "FREET3" Anemia work up No results for input(s): "VITAMINB12", "FOLATE", "FERRITIN", "TIBC", "IRON", "RETICCTPCT" in the last 72 hours. Urinalysis    Component Value Date/Time   COLORURINE YELLOW (A) 04/22/2018 0255    APPEARANCEUR Clear 09/28/2023  0924   LABSPEC 1.016 04/22/2018 0255   LABSPEC 1.015 04/12/2013 1256   PHURINE 5.0 04/22/2018 0255   GLUCOSEU Trace (A) 09/28/2023 0924   GLUCOSEU >=500 04/12/2013 1256   HGBUR NEGATIVE 04/22/2018 0255   BILIRUBINUR Negative 09/28/2023 0924   BILIRUBINUR Negative 04/12/2013 1256   KETONESUR 5 (A) 04/22/2018 0255   PROTEINUR 1+ (A) 09/28/2023 0924   PROTEINUR NEGATIVE 04/22/2018 0255   UROBILINOGEN 0.2 09/18/2020 1310   UROBILINOGEN 0.2 02/05/2010 1041   NITRITE Negative 09/28/2023 0924   NITRITE NEGATIVE 04/22/2018 0255   LEUKOCYTESUR Negative 09/28/2023 0924   LEUKOCYTESUR Negative 04/12/2013 1256   Sepsis Labs Recent Labs  Lab 10/13/23 1315 10/14/23 0449 10/15/23 0436  WBC 9.1 7.1 5.9   Microbiology Recent Results (from the past 240 hour(s))  Blood culture (routine x 2)     Status: None (Preliminary result)   Collection Time: 10/13/23  4:47 PM   Specimen: BLOOD  Result Value Ref Range Status   Specimen Description BLOOD BLOOD RIGHT ARM  Final   Special Requests   Final    BOTTLES DRAWN AEROBIC AND ANAEROBIC Blood Culture adequate volume   Culture   Final    NO GROWTH 2 DAYS Performed at [facility], [address] [place], [place] [address]    Report Status PENDING  Incomplete  Blood culture (routine x 2)     Status: None (Preliminary result)   Collection Time: 10/13/23  4:47 PM   Specimen: BLOOD  Result Value Ref Range Status   Specimen Description BLOOD BLOOD RIGHT FOREARM  Final   Special Requests   Final    BOTTLES DRAWN AEROBIC AND ANAEROBIC Blood Culture results may not be optimal due to an excessive volume of blood received in culture bottles   Culture   Final    NO GROWTH 2 DAYS Performed at [facility], [address], [place], [place] [address]    Report Status PENDING  Incomplete  Resp panel by RT-PCR (RSV, Flu A&B, Covid) Anterior Nasal Swab     Status: None   Collection Time: 10/13/23  6:47 PM    Specimen: Anterior Nasal Swab  Result Value Ref Range Status   SARS Coronavirus 2 by RT PCR NEGATIVE NEGATIVE Final    Comment: (NOTE) SARS-CoV-2 target nucleic acids are NOT DETECTED.  The SARS-CoV-2 RNA is generally detectable in upper respiratory specimens during the acute phase of infection. The lowest concentration of SARS-CoV-2 viral copies this assay can detect is 138 copies/mL. A negative result does not preclude SARS-Cov-2 infection and should not be used as the sole basis for treatment or other patient management decisions. A negative result may occur with  improper specimen collection/handling, submission of specimen other than nasopharyngeal swab, presence of viral mutation(s) within the areas targeted by this assay, and inadequate number of viral copies(<138 copies/mL). A negative result must be combined with clinical observations, patient history, and epidemiological information. The expected result is Negative.  Fact Sheet for Patients:  BloggerCourse.com  Fact Sheet for Healthcare Providers:  SeriousBroker.it  This test is no t yet approved or cleared by the [place] FDA and  has been authorized for detection and/or diagnosis of SARS-CoV-2 by FDA under an Emergency Use Authorization (EUA). This EUA will remain  in effect (meaning this test can be used) for the duration of the COVID-19 declaration under Section 564(b)(1) of the Act, 21 U.S.C.section 360bbb-3(b)(1), unless the authorization is terminated  or revoked sooner.  Influenza A by PCR NEGATIVE NEGATIVE Final   Influenza B by PCR NEGATIVE NEGATIVE Final    Comment: (NOTE) The Xpert Xpress SARS-CoV-2/FLU/RSV plus assay is intended as an aid in the diagnosis of influenza from Nasopharyngeal swab specimens and should not be used as a sole basis for treatment. Nasal washings and aspirates are unacceptable for Xpert Xpress  SARS-CoV-2/FLU/RSV testing.  Fact Sheet for Patients: BloggerCourse.com  Fact Sheet for Healthcare Providers: SeriousBroker.it  This test is not yet approved or cleared by the [place] FDA and has been authorized for detection and/or diagnosis of SARS-CoV-2 by FDA under an Emergency Use Authorization (EUA). This EUA will remain in effect (meaning this test can be used) for the duration of the COVID-19 declaration under Section 564(b)(1) of the Act, 21 U.S.C. section 360bbb-3(b)(1), unless the authorization is terminated or revoked.     Resp Syncytial Virus by PCR NEGATIVE NEGATIVE Final    Comment: (NOTE) Fact Sheet for Patients: BloggerCourse.com  Fact Sheet for Healthcare Providers: SeriousBroker.it  This test is not yet approved or cleared by the [place] FDA and has been authorized for detection and/or diagnosis of SARS-CoV-2 by FDA under an Emergency Use Authorization (EUA). This EUA will remain in effect (meaning this test can be used) for the duration of the COVID-19 declaration under Section 564(b)(1) of the Act, 21 U.S.C. section 360bbb-3(b)(1), unless the authorization is terminated or revoked.  Performed at [facility], [address] [place], [place] [address]      Time coordinating discharge: Over 30 minutes  SIGNED:   Charise Killian, MD  [facility] 10/15/2023, 1:20 PM Pager   If 7PM-7AM, please contact night-coverage

## 2023-10-17 ENCOUNTER — Ambulatory Visit: Payer: Medicare Other

## 2023-10-17 ENCOUNTER — Telehealth: Payer: Self-pay

## 2023-10-17 LAB — GLUCOSE, CAPILLARY: Glucose-Capillary: 133 mg/dL — ABNORMAL HIGH (ref 70–99)

## 2023-10-17 NOTE — Transitions of Care (Post Inpatient/ED Visit) (Signed)
10/17/2023  Name: Jerry Parsons MRN: [id number] DOB: Mar 05, 1951  Today's TOC FU Call Status: Today's TOC FU Call Status:: Successful TOC FU Call Completed TOC FU Call Complete Date: 10/17/23 Patient's Name and Date of Birth confirmed.  Transition Care Management Follow-up Telephone Call Date of Discharge: 10/15/23 Discharge Facility: [facility]) Type of Discharge: Inpatient Admission Primary Inpatient Discharge Diagnosis:: CAP How have you been since you were released from the hospital?: Better Any questions or concerns?: No  Items Reviewed: Did you receive and understand the discharge instructions provided?: Yes Medications obtained,verified, and reconciled?: Yes (Medications Reviewed) Any new allergies since your discharge?: No Dietary orders reviewed?: Yes Type of Diet Ordered:: Reg [place]ert Healthy, Carb modified Do you have support at home?: Yes People in Home: spouse Name of Support/Comfort Primary Source: Meriam Sprague  Medications Reviewed Today: Medications Reviewed Today     Reviewed by Johnnette Barrios, RN (Registered Nurse) on 10/17/23 at 1616  Med List Status: <None>   Medication Order Taking? Sig Documenting Provider Last Dose Status Informant  amiodarone (PACERONE) 200 MG tablet [id number] Yes TAKE 1 TABLET BY MOUTH EVERY DAY Gollan, Tollie Pizza, MD Taking Active Spouse/Significant Other  Apoaequorin (PREVAGEN) 10 MG CAPS [id number] Yes Take 10 mg by mouth daily. [provider] Taking Active Spouse/Significant Other  aspirin EC 81 MG tablet [id number] Yes Take 81 mg by mouth daily. Swallow whole. [provider] Taking Active Spouse/Significant Other  azithromycin (ZITHROMAX) 500 MG tablet [id number] Yes Take 1 tablet (500 mg total) by mouth daily for 3 days. Charise Killian, MD Taking Active   Continuous Blood Gluc Receiver (FREESTYLE LIBRE 2 READER) DEVI [id number] Yes Inject 1 Device into the skin daily. DGX  Simmons-Robinson, Makiera, MD Taking Active Spouse/Significant Other  Continuous Blood Gluc Sensor (FREESTYLE LIBRE 2 SENSOR) MISC [id number] Yes 2 Devices by Does not apply route daily. Simmons-Robinson, Makiera, MD Taking Active Spouse/Significant Other  doxycycline (VIBRAMYCIN) 100 MG capsule [id number] Yes Take 1 capsule (100 mg total) by mouth 2 (two) times daily. Vanna Scotland, MD Taking Active Spouse/Significant Other  ELIQUIS 5 MG TABS tablet [id number] Yes TAKE 1 TABLET TWICE A DAY Gollan, Tollie Pizza, MD Taking Active Spouse/Significant Other  FARXIGA 10 MG TABS tablet [id number] Yes TAKE 1 TABLET BY MOUTH EVERY DAY Ostwalt, Janna, PA-C Taking Active Spouse/Significant Other  ferrous sulfate 325 (65 FE) MG tablet [id number] No Take 1 tablet by mouth daily.  Patient not taking: Reported on 10/17/2023   [provider] Not Taking Active Spouse/Significant Other  fexofenadine (ALLEGRA) 180 MG tablet [id number] Yes Take 180 mg by mouth daily. [provider] Taking Active Spouse/Significant Other  furosemide (LASIX) 40 MG tablet [id number]  TAKE 1 TABLET BY MOUTH EVERY DAY Gollan, Tollie Pizza, MD  Active Spouse/Significant Other           Med Note Sharon Seller, Keyani Rigdon L   Mon Oct 17, 2023  2:31 PM) Takes 20 mg qd  gabapentin (NEURONTIN) 300 MG capsule [id number] Yes Take 1 capsule (300 mg total) by mouth at bedtime. Malva Limes, MD Taking Active Spouse/Significant Other           Med Note Sharon Seller, Daronte Shostak Everlene Farrier Oct 17, 2023  4:13 PM) Per spouse MD ordered 300 mg 2 x day and 600 mg at bedtime , she is giving 100 mg 2 x day and 300 mg at hs  Insulin Glargine [facility] KWIKPEN) 100 UNIT/ML [id number] Yes INJECT 40 UNITS  SUBCUTANEOUSLY TWO TIMES A DAY Simmons-Robinson, Makiera, MD Taking Active Spouse/Significant Other  JANUVIA 100 MG tablet [id number] No TAKE 1 TABLET DAILY  Patient not taking: Reported on 10/17/2023   Bosie Clos, MD Not Taking Active Spouse/Significant  Other  linaclotide Karlene Einstein) 145 MCG CAPS capsule [id number] Yes Take 145 mcg by mouth daily before breakfast. [provider] Taking Active Spouse/Significant Other  magnesium oxide (MAG-OX) 400 MG tablet [id number] Yes TAKE 1 TABLET BY MOUTH TWICE A DAY Bosie Clos, MD Taking Active Spouse/Significant Other  metoprolol tartrate (LOPRESSOR) 100 MG tablet [id number] Yes TAKE 1 TABLET BY MOUTH TWICE A DAY Bosie Clos, MD Taking Active Spouse/Significant Other  naloxone [facility]) nasal spray 4 mg/0.1 mL [id number] Yes Please use in the event of overdose from narcotic medication Simmons-Robinson, Tawanna Cooler, MD Taking Active Spouse/Significant Other  NOVOLOG FLEXPEN 100 UNIT/ML FlexPen [id number] Yes INJECT 14 UNITS            SUBCUTANEOUSLY IN THE      MORNING AND AT BEDTIME Bosie Clos, MD Taking Active Spouse/Significant Other  nystatin-triamcinolone ointment [facility]) [id number] Yes Apply 1 Application topically 2 (two) times daily. Vanna Scotland, MD Taking Active Spouse/Significant Other  Omega-3 Fatty Acids (FISH OIL) 1200 MG CAPS [id number] Yes Take 1,200 mg by mouth 2 (two) times daily.  [provider] Taking Active Spouse/Significant Other  ONETOUCH ULTRA test strip [id number] Yes USE WITH METER TWICE DAILY Bosie Clos, MD Taking Active Spouse/Significant Other  oxyCODONE (OXYCONTIN) 40 mg 12 hr tablet [id number] Yes Take 1 tablet (40 mg total) by mouth every 8 (eight) hours as needed. Simmons-Robinson, Makiera, MD Taking Active Spouse/Significant Other  pregabalin (LYRICA) 200 MG capsule [id number] Yes TAKE 1 CAPSULE 3 TIMES A   DAY Bosie Clos, MD Taking Active Spouse/Significant Other  rOPINIRole (REQUIP) 1 MG tablet [id number] Yes TAKE 1 TO 2 TABLETS BY MOUTH AT BEDTIME AS NEEDED  Patient taking differently: Take 1-2 mg by mouth 2 (two) times daily.   Bosie Clos, MD Taking Active Spouse/Significant Other  rOPINIRole (REQUIP) 2 MG tablet  [id number] Yes TAKE 1 TABLET BY MOUTH TWICE A DAY AS NEEDED  Patient taking differently: Take 2 mg by mouth at bedtime.   Bosie Clos, MD Taking Active Spouse/Significant Other  sucralfate (CARAFATE) 1 g tablet [id number] No TAKE 1 TABLET BY MOUTH 4 (FOUR) TIMES DAILY - WITH MEALS AND AT BEDTIME.  Patient not taking: Reported on 10/17/2023   Bosie Clos, MD Not Taking Active Spouse/Significant Other  tamsulosin (FLOMAX) 0.4 MG CAPS capsule [id number] No Take 0.4 mg by mouth daily.  Patient not taking: Reported on 10/17/2023   [provider] Not Taking Active Spouse/Significant Other  Med List Note Cardell Peach 11/25/16 6789): PATIENT USES BIPAP 14/9 AT NIGHT             Home Care and Equipment/Supplies: Were Home Health Services Ordered?: No Any new equipment or medical supplies ordered?: No  Functional Questionnaire: Do you need assistance with bathing/showering or dressing?: No Do you need assistance with meal preparation?: No Do you need assistance with eating?: No Do you have difficulty maintaining continence: No Do you need assistance with getting out of bed/getting out of a chair/moving?: No Do you have difficulty managing or taking your medications?: No (Spouse manages meds, see multiple changes, corrections)  Follow up appointments reviewed: PCP Follow-up appointment confirmed?: No (Spouse is calling to set up appt next week) MD Provider Line Number:[phone] Given:  No Specialist Hospital Follow-up appointment confirmed?: NA (Had appt with specialist) Do you need transportation to your follow-up appointment?: No (He is able to drive but spouse will drive  him) Do you understand care options if your condition(s) worsen?: Yes-patient verbalized understanding   Patient is at high risk for readmission and/or has history of  high utilization  Discussed [facility]  TOC program and weekly calls to patient to assess condition/status, medication  management  and provide support/education as indicated . Patient/ Caregiver voiced understanding and declined enrollment in the 30-day TOC Program.      The patient has been provided with contact information for the care management team and has been advised to call with any health related questions or concerns.     Susa Loffler , BSN, RN Care Management Coordinator [place]   [facility] [email or web address] Direct Dial: [phone]

## 2023-10-18 LAB — CULTURE, BLOOD (ROUTINE X 2)
Culture: NO GROWTH
Culture: NO GROWTH
Special Requests: ADEQUATE

## 2023-10-19 ENCOUNTER — Ambulatory Visit: Payer: Medicare Other

## 2023-10-24 ENCOUNTER — Ambulatory Visit: Payer: Medicare Other

## 2023-10-25 DIAGNOSIS — J449 Chronic obstructive pulmonary disease, unspecified: Secondary | ICD-10-CM | POA: Diagnosis not present

## 2023-10-25 DIAGNOSIS — J4489 Other specified chronic obstructive pulmonary disease: Secondary | ICD-10-CM | POA: Diagnosis not present

## 2023-10-26 ENCOUNTER — Ambulatory Visit: Payer: Medicare Other

## 2023-10-31 ENCOUNTER — Ambulatory Visit: Payer: Medicare Other

## 2023-11-01 DIAGNOSIS — I480 Paroxysmal atrial fibrillation: Secondary | ICD-10-CM | POA: Diagnosis not present

## 2023-11-01 DIAGNOSIS — J159 Unspecified bacterial pneumonia: Secondary | ICD-10-CM | POA: Diagnosis not present

## 2023-11-01 DIAGNOSIS — E1142 Type 2 diabetes mellitus with diabetic polyneuropathy: Secondary | ICD-10-CM | POA: Diagnosis not present

## 2023-11-01 DIAGNOSIS — K921 Melena: Secondary | ICD-10-CM | POA: Diagnosis not present

## 2023-11-01 DIAGNOSIS — Z794 Long term (current) use of insulin: Secondary | ICD-10-CM | POA: Diagnosis not present

## 2023-11-01 DIAGNOSIS — K219 Gastro-esophageal reflux disease without esophagitis: Secondary | ICD-10-CM | POA: Diagnosis not present

## 2023-11-01 DIAGNOSIS — G4733 Obstructive sleep apnea (adult) (pediatric): Secondary | ICD-10-CM | POA: Diagnosis not present

## 2023-11-02 ENCOUNTER — Ambulatory Visit: Payer: Medicare Other

## 2023-11-07 ENCOUNTER — Ambulatory Visit: Payer: Medicare Other

## 2023-11-08 DIAGNOSIS — Z23 Encounter for immunization: Secondary | ICD-10-CM | POA: Diagnosis not present

## 2023-11-08 DIAGNOSIS — J449 Chronic obstructive pulmonary disease, unspecified: Secondary | ICD-10-CM | POA: Diagnosis not present

## 2023-11-09 ENCOUNTER — Ambulatory Visit: Payer: Medicare Other

## 2023-11-11 ENCOUNTER — Other Ambulatory Visit: Payer: Self-pay | Admitting: Podiatry

## 2023-11-14 ENCOUNTER — Ambulatory Visit: Payer: Medicare Other

## 2023-11-16 ENCOUNTER — Ambulatory Visit: Payer: Medicare Other

## 2023-11-17 ENCOUNTER — Ambulatory Visit (INDEPENDENT_AMBULATORY_CARE_PROVIDER_SITE_OTHER): Payer: Medicare Other | Admitting: Podiatry

## 2023-11-17 ENCOUNTER — Encounter: Payer: Self-pay | Admitting: Podiatry

## 2023-11-17 ENCOUNTER — Telehealth: Payer: Self-pay | Admitting: Cardiovascular Disease

## 2023-11-17 DIAGNOSIS — M79675 Pain in left toe(s): Secondary | ICD-10-CM | POA: Diagnosis not present

## 2023-11-17 DIAGNOSIS — R1032 Left lower quadrant pain: Secondary | ICD-10-CM | POA: Diagnosis not present

## 2023-11-17 DIAGNOSIS — K760 Fatty (change of) liver, not elsewhere classified: Secondary | ICD-10-CM | POA: Diagnosis not present

## 2023-11-17 DIAGNOSIS — Z860100 Personal history of colon polyps, unspecified: Secondary | ICD-10-CM | POA: Diagnosis not present

## 2023-11-17 DIAGNOSIS — M79674 Pain in right toe(s): Secondary | ICD-10-CM

## 2023-11-17 DIAGNOSIS — K8689 Other specified diseases of pancreas: Secondary | ICD-10-CM | POA: Diagnosis not present

## 2023-11-17 DIAGNOSIS — I739 Peripheral vascular disease, unspecified: Secondary | ICD-10-CM

## 2023-11-17 DIAGNOSIS — K219 Gastro-esophageal reflux disease without esophagitis: Secondary | ICD-10-CM | POA: Diagnosis not present

## 2023-11-17 DIAGNOSIS — R1031 Right lower quadrant pain: Secondary | ICD-10-CM | POA: Diagnosis not present

## 2023-11-17 DIAGNOSIS — E1142 Type 2 diabetes mellitus with diabetic polyneuropathy: Secondary | ICD-10-CM

## 2023-11-17 DIAGNOSIS — D509 Iron deficiency anemia, unspecified: Secondary | ICD-10-CM | POA: Diagnosis not present

## 2023-11-17 DIAGNOSIS — D696 Thrombocytopenia, unspecified: Secondary | ICD-10-CM | POA: Diagnosis not present

## 2023-11-17 DIAGNOSIS — K5903 Drug induced constipation: Secondary | ICD-10-CM | POA: Diagnosis not present

## 2023-11-17 DIAGNOSIS — B351 Tinea unguium: Secondary | ICD-10-CM | POA: Diagnosis not present

## 2023-11-17 NOTE — Telephone Encounter (Signed)
   Pre-operative Risk Assessment    Patient Name: Jerry Parsons  DOB: January 24, 1951 MRN: [id number]      Request for Surgical Clearance    Procedure:   colonoscopy  Date of Surgery:  Clearance 12/12/23                                 Surgeon:  Dr. Timothy Lasso Surgeon's Group or Practice Name:  [facility] Phone number:  [phone] Fax number:  [phone]   Type of Clearance Requested:   - Pharmacy:  Hold Apixaban (Eliquis) instructions   Type of Anesthesia:  Not Indicated   Additional requests/questions:    [name]   11/17/2023, 4:14 PM

## 2023-11-17 NOTE — Progress Notes (Signed)
  Subjective:  Patient ID: Jerry Parsons, male    DOB: 1951/06/27,  MRN: [id number]  No chief complaint on file.   72 y.o. male presents with the above complaint. History confirmed with patient. Patient presenting with pain related to dystrophic thickened elongated nails. Patient is unable to trim own nails related to nail dystrophy and/or mobility issues. 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   Objective:  Physical Exam: warm, good capillary refill nail exam onychomycosis of the toenails, onycholysis, and dystrophic nails DP pulses palpable, PT pulses palpable, and protective sensation absent Left Foot:  Pain with palpation of nails due to elongation and dystrophic growth.  Right Foot: Pain with palpation of nails due to elongation and dystrophic growth.   Assessment:   1. Pain due to onychomycosis of toenails of both feet   2. PAD (peripheral artery disease) (HCC)   3. Diabetic polyneuropathy associated with type 2 diabetes mellitus (HCC)          Plan:  Patient was evaluated and treated and all questions answered.  #Onychomycosis with pain  -Nails palliatively debrided as below. -Educated on self-care  Procedure: Nail Debridement Rationale: Pain Type of Debridement: manual, sharp debridement. Instrumentation: Nail nipper, rotary burr. Number of Nails: 10  Return in about 3 months (around 02/17/2024) for [facility].         Corinna Gab, DPM [facility] / [facility]

## 2023-11-18 ENCOUNTER — Other Ambulatory Visit: Payer: Self-pay | Admitting: Nurse Practitioner

## 2023-11-18 DIAGNOSIS — D696 Thrombocytopenia, unspecified: Secondary | ICD-10-CM

## 2023-11-18 DIAGNOSIS — K5903 Drug induced constipation: Secondary | ICD-10-CM

## 2023-11-18 DIAGNOSIS — E119 Type 2 diabetes mellitus without complications: Secondary | ICD-10-CM | POA: Diagnosis not present

## 2023-11-18 DIAGNOSIS — K8689 Other specified diseases of pancreas: Secondary | ICD-10-CM

## 2023-11-18 DIAGNOSIS — Z961 Presence of intraocular lens: Secondary | ICD-10-CM | POA: Diagnosis not present

## 2023-11-18 DIAGNOSIS — K760 Fatty (change of) liver, not elsewhere classified: Secondary | ICD-10-CM

## 2023-11-18 DIAGNOSIS — H179 Unspecified corneal scar and opacity: Secondary | ICD-10-CM | POA: Diagnosis not present

## 2023-11-18 DIAGNOSIS — H53001 Unspecified amblyopia, right eye: Secondary | ICD-10-CM | POA: Diagnosis not present

## 2023-11-18 DIAGNOSIS — R1031 Right lower quadrant pain: Secondary | ICD-10-CM

## 2023-11-21 ENCOUNTER — Ambulatory Visit: Payer: Medicare Other

## 2023-11-21 NOTE — Telephone Encounter (Signed)
Patient with diagnosis of A Fib and VTE on Eliquis for anticoagulation.    Procedure: colonoscopy Date of procedure: 12/12/23   CHA2DS2-VASc Score = 7  This indicates a 11.2% annual risk of stroke. The patient's score is based upon: CHF History: 1 HTN History: 1 Diabetes History: 1 Stroke History: 2 (possible TIA?) Vascular Disease History: 1 Age Score: 1 Gender Score: 0    CrCl 70 ml/min using adj body weight Platelet count 112K  Per office protocol, patient can hold  Eliquis for 1 day prior to procedure.    Patient will not need bridging with Lovenox (enoxaparin) around procedure.  **This guidance is not considered finalized until pre-operative APP has relayed final recommendations.**

## 2023-11-21 NOTE — Telephone Encounter (Signed)
   Patient Name: Jerry Parsons  DOB: December 06, 1951 MRN: [id number]  Primary Cardiologist: Julien Nordmann, MD  Chart reviewed as part of pre-operative protocol coverage. Pre-op clearance already addressed by colleagues in earlier phone notes. To summarize recommendations:  -Per office protocol, patient can hold  Eliquis for 1 day prior to procedure.    Patient will not need bridging with Lovenox (enoxaparin) around procedure.   Medical clearance was not requested.  Will route this bundled recommendation to requesting provider via Epic fax function and remove from pre-op pool. Please call with questions.  Sharlene Dory, PA-C 11/21/2023, 8:35 AM

## 2023-11-22 ENCOUNTER — Ambulatory Visit
Admission: RE | Admit: 2023-11-22 | Discharge: 2023-11-22 | Disposition: A | Discharge status: Home / Self Care | Payer: Medicare Other | Source: Ambulatory Visit | Attending: Nurse Practitioner | Admitting: Nurse Practitioner

## 2023-11-22 DIAGNOSIS — R1031 Right lower quadrant pain: Secondary | ICD-10-CM | POA: Diagnosis not present

## 2023-11-22 DIAGNOSIS — R1032 Left lower quadrant pain: Secondary | ICD-10-CM | POA: Insufficient documentation

## 2023-11-22 DIAGNOSIS — D696 Thrombocytopenia, unspecified: Secondary | ICD-10-CM | POA: Insufficient documentation

## 2023-11-22 DIAGNOSIS — Z9049 Acquired absence of other specified parts of digestive tract: Secondary | ICD-10-CM | POA: Diagnosis not present

## 2023-11-22 DIAGNOSIS — K8689 Other specified diseases of pancreas: Secondary | ICD-10-CM | POA: Insufficient documentation

## 2023-11-22 DIAGNOSIS — K760 Fatty (change of) liver, not elsewhere classified: Secondary | ICD-10-CM | POA: Insufficient documentation

## 2023-11-22 DIAGNOSIS — K5903 Drug induced constipation: Secondary | ICD-10-CM | POA: Insufficient documentation

## 2023-11-23 ENCOUNTER — Ambulatory Visit: Payer: Medicare Other

## 2023-11-28 ENCOUNTER — Ambulatory Visit: Payer: Medicare Other

## 2023-11-29 DIAGNOSIS — N2889 Other specified disorders of kidney and ureter: Secondary | ICD-10-CM | POA: Diagnosis not present

## 2023-11-30 ENCOUNTER — Ambulatory Visit: Payer: Medicare Other

## 2023-11-30 DIAGNOSIS — Z87891 Personal history of nicotine dependence: Secondary | ICD-10-CM | POA: Diagnosis not present

## 2023-11-30 DIAGNOSIS — Z6839 Body mass index (BMI) 39.0-39.9, adult: Secondary | ICD-10-CM | POA: Diagnosis not present

## 2023-11-30 DIAGNOSIS — I509 Heart failure, unspecified: Secondary | ICD-10-CM | POA: Diagnosis not present

## 2023-11-30 DIAGNOSIS — K5909 Other constipation: Secondary | ICD-10-CM | POA: Diagnosis not present

## 2023-11-30 DIAGNOSIS — I480 Paroxysmal atrial fibrillation: Secondary | ICD-10-CM | POA: Diagnosis not present

## 2023-11-30 DIAGNOSIS — M48062 Spinal stenosis, lumbar region with neurogenic claudication: Secondary | ICD-10-CM | POA: Diagnosis not present

## 2023-11-30 DIAGNOSIS — I110 Hypertensive heart disease with heart failure: Secondary | ICD-10-CM | POA: Diagnosis not present

## 2023-11-30 DIAGNOSIS — G4733 Obstructive sleep apnea (adult) (pediatric): Secondary | ICD-10-CM | POA: Diagnosis not present

## 2023-11-30 NOTE — Telephone Encounter (Signed)
Caller Dahlia Client) wants copy of clearance faxed to fax# [phone].

## 2023-11-30 NOTE — Telephone Encounter (Signed)
I will re-fax clearance notes to fax # [phone], which was provided to by phone call as well as this is the fax# on the clearance that we did fax notes on 11/21/23 from [place], [facility].

## 2023-12-01 DIAGNOSIS — M65342 Trigger finger, left ring finger: Secondary | ICD-10-CM | POA: Diagnosis not present

## 2023-12-02 ENCOUNTER — Ambulatory Visit (INDEPENDENT_AMBULATORY_CARE_PROVIDER_SITE_OTHER): Payer: Medicare Other | Admitting: Urology

## 2023-12-02 VITALS — BP 115/66 | HR 71 | Ht 70.0 in | Wt 270.0 lb

## 2023-12-02 DIAGNOSIS — N2889 Other specified disorders of kidney and ureter: Secondary | ICD-10-CM

## 2023-12-02 DIAGNOSIS — Z87438 Personal history of other diseases of male genital organs: Secondary | ICD-10-CM

## 2023-12-02 DIAGNOSIS — R93429 Abnormal radiologic findings on diagnostic imaging of unspecified kidney: Secondary | ICD-10-CM

## 2023-12-02 NOTE — Progress Notes (Signed)
[name],acting as a scribe for Vanna Scotland, MD.,have documented all relevant documentation on the behalf of Vanna Scotland, MD,as directed by  Vanna Scotland, MD while in the presence of Vanna Scotland, MD.  12/02/23 2:48 PM   Jerry Parsons 1951-12-11 [id number]  Referring provider: Bosie Clos, MD [address] [place],  [place] [address]  Chief Complaint  Patient presents with   kidney cyst    HPI: 72 year-old male who is referred back for an incidental finding on ultrasound.   He was previously seen by me most recently just back in October for other GU issues including scrotal pain. Most recently, he underwent an ultrasound of the abdomen for thrombocytopenia and hepatic steatosis. This showed an incidental finding of a possible mid-pole hypoechoic mass that the radiologist measured at 3 x 2.7 x 5 cm.  I personally reviewed that study today. I also reviewed a CT of the abdomen pelvis with contrast from 09/03/2023, that just showed bilateral renal cysts and the hypoechoic lesion in question was not appreciated.   He has a history of scrotal pain, previously evaluated with an ultrasound and treated with antibiotics, which did not alleviate the pain. He reports that scrotal swelling occurs with fluid overload, which resolves when fluid is reduced.    He reports significant weight gain and fluid retention, which may be contributing to scrotal swelling.   PMH: 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    x2   GERD (gastroesophageal reflux disease)    Headache    h/o as a child   Hypercholesteremia    Hypertension    Kidney failure    acute   Neuromuscular  disorder (HCC)    Neuropathy    Pneumonia    time 9 ;last episode 12/2015   Pulmonary embolus (HCC) 2011   Seasonal allergies    Seizures (HCC)    as child    Sleep apnea    BIPAP   Stiff neck    limited turning s/p titanium plate placement   TIA (transient ischemic attack)    Wears dentures    full upper and lower    Surgical History: Past Surgical History:  Procedure Laterality Date   BACK SURGERY     x 8; upper x 3 & lower x 5   CARDIOVERSION  03/14/13, 10/16   2014 - [facility], 2016 - [facility]   CARDIOVERSION N/A 09/11/2020   Procedure: CARDIOVERSION;  Surgeon: Antonieta Iba, MD;  Location: [facility];  Service: Cardiovascular;  Laterality: N/A;   CATARACT EXTRACTION W/PHACO Left 10/29/2015   Procedure: CATARACT EXTRACTION PHACO AND INTRAOCULAR LENS PLACEMENT (IOC);  Surgeon: Lockie Mola, MD;  Location: [facility];  Service: Ophthalmology;  Laterality: Left;  DIABETIC - insulin and oral medsSleep apnea - no machine   CATARACT EXTRACTION W/PHACO Right 02/16/2023   Procedure: CATARACT EXTRACTION PHACO AND INTRAOCULAR LENS PLACEMENT (IOC) RIGHT DIABETIC MALYUGIN HEALON 5 VISION BLUE  65.52  03:57.7;  Surgeon: Lockie Mola, MD;  Location: [facility];  Service: Ophthalmology;  Laterality: Right;  Diabetic   CHOLECYSTECTOMY  COLONOSCOPY WITH PROPOFOL N/A 01/16/2018   Procedure: COLONOSCOPY WITH PROPOFOL;  Surgeon: Scot Jun, MD;  Location: [facility];  Service: Endoscopy;  Laterality: N/A;   ESOPHAGOGASTRODUODENOSCOPY (EGD) WITH PROPOFOL N/A 01/16/2018   Procedure: ESOPHAGOGASTRODUODENOSCOPY (EGD) WITH PROPOFOL;  Surgeon: Scot Jun, MD;  Location: [facility] END[facility];  Service: Endoscopy;  Laterality: N/A;   EYE SURGERY     GALLBLADDER SURGERY  2002   JOINT REPLACEMENT Right 2018   KNEE ARTHROSCOPY     left    PARATHYROIDECTOMY N/A 07/25/2020   Procedure: PARATHYROIDECTOMY;  Surgeon: Duanne Guess, MD;  Location: [facility] OR[facility];  Service:  General;  Laterality: N/A;   ROTATOR CUFF REPAIR  2001   left    TEE WITHOUT CARDIOVERSION N/A 04/26/2018   Procedure: TRANSESOPHAGEAL ECHOCARDIOGRAM (TEE);  Surgeon: Yvonne Kendall, MD;  Location: [facility];  Service: Cardiovascular;  Laterality: N/A;   TONSILLECTOMY     TOTAL KNEE ARTHROPLASTY Right 06/20/2017   Procedure: RIGHT TOTAL KNEE ARTHROPLASTY;  Surgeon: Ollen Gross, MD;  Location: [facility];  Service: Orthopedics;  Laterality: Right;    Home Medications:  Allergies as of 12/02/2023       Reactions   Carisoprodol Itching        Medication List        Accurate as of December 02, 2023  2:48 PM. If you have any questions, ask your nurse or doctor.          STOP taking these medications    aspirin EC 81 MG tablet Stopped by: Vanna Scotland   doxycycline 100 MG capsule Commonly known as: VIBRAMYCIN Stopped by: Vanna Scotland   furosemide 40 MG tablet Commonly known as: LASIX Stopped by: Vanna Scotland   Januvia 100 MG tablet Generic drug: sitaGLIPtin Stopped by: Vanna Scotland   linaclotide 145 MCG Caps capsule Commonly known as: LINZESS Stopped by: Vanna Scotland   meloxicam 7.5 MG tablet Commonly known as: MOBIC Stopped by: Vanna Scotland   nystatin-triamcinolone ointment Commonly known as: MYCOLOG Stopped by: Vanna Scotland   Prevagen 10 MG Caps Generic drug: Apoaequorin Stopped by: Vanna Scotland   sucralfate 1 g tablet Commonly known as: CARAFATE Stopped by: Vanna Scotland       TAKE these medications    amiodarone 200 MG tablet Commonly known as: PACERONE TAKE 1 TABLET BY MOUTH EVERY DAY   Basaglar KwikPen 100 UNIT/ML INJECT 40 UNITS            SUBCUTANEOUSLY TWO TIMES A DAY   dutasteride 0.5 MG capsule Commonly known as: AVODART Take 0.5 mg by mouth daily.   Eliquis 5 MG Tabs tablet Generic drug: apixaban TAKE 1 TABLET TWICE A DAY   Farxiga 10 MG Tabs tablet Generic drug: dapagliflozin propanediol TAKE 1 TABLET BY MOUTH  EVERY DAY   ferrous sulfate 325 (65 FE) MG tablet Take 1 tablet by mouth daily.   fexofenadine 180 MG tablet Commonly known as: ALLEGRA Take 180 mg by mouth daily.   Fish Oil 1200 MG Caps Take 1,200 mg by mouth 2 (two) times daily.   FreeStyle Libre 2 Reader Devi Inject 1 Device into the skin daily. DGX   FreeStyle Libre 2 Sensor Misc 2 Devices by Does not apply route daily.   gabapentin 100 MG capsule Commonly known as: NEURONTIN Take 100 mg by mouth 2 (two) times daily.   gabapentin 300 MG capsule Commonly known as: NEURONTIN Take 1 capsule (300 mg total) by mouth at bedtime.   magnesium oxide 400 MG tablet  Commonly known as: MAG-OX TAKE 1 TABLET BY MOUTH TWICE A DAY   metoprolol tartrate 100 MG tablet Commonly known as: LOPRESSOR TAKE 1 TABLET BY MOUTH TWICE A DAY   naloxone 4 MG/0.1ML Liqd nasal spray kit Commonly known as: NARCAN Please use in the event of overdose from narcotic medication   NovoLOG FlexPen 100 UNIT/ML FlexPen Generic drug: insulin aspart INJECT 14 UNITS            SUBCUTANEOUSLY IN THE      MORNING AND AT BEDTIME   OneTouch Ultra test strip Generic drug: glucose blood USE WITH METER TWICE DAILY   oxyCODONE 40 mg 12 hr tablet Commonly known as: OxyCONTIN Take 1 tablet (40 mg total) by mouth every 8 (eight) hours as needed.   pregabalin 200 MG capsule Commonly known as: LYRICA TAKE 1 CAPSULE 3 TIMES A   DAY   rOPINIRole 1 MG tablet Commonly known as: REQUIP TAKE 1 TO 2 TABLETS BY MOUTH AT BEDTIME AS NEEDED What changed: when to take this   rOPINIRole 2 MG tablet Commonly known as: REQUIP TAKE 1 TABLET BY MOUTH TWICE A DAY AS NEEDED What changed: when to take this   spironolactone 25 MG tablet Commonly known as: ALDACTONE Take 1 tablet by mouth daily.   tamsulosin 0.4 MG Caps capsule Commonly known as: FLOMAX Take 0.4 mg by mouth daily.   torsemide 20 MG tablet Commonly known as: DEMADEX Take 20 mg by mouth daily.    VITAMIN D PO Take by mouth. 1 tablet once daily in the morning        Allergies:  Allergies  Allergen Reactions   Carisoprodol Itching    Family History: Family History  Problem Relation Age of Onset   Heart attack Brother     Social History:  reports that he quit smoking about 33 years ago. His smoking use included cigarettes. He started smoking about 53 years ago. He has a 40 pack-year smoking history. He has never used smokeless tobacco. He reports that he does not drink alcohol and does not use drugs.   Physical Exam: BP 115/66   Pulse 71   Ht 5\' 10"  (1.778 m)   Wt 270 lb (122.5 kg)   BMI 38.74 kg/m   Constitutional:  Alert and oriented, No acute distress. HEENT: [place] AT, moist mucus membranes.  Trachea midline, no masses. Neurologic: Grossly intact, no focal deficits, moving all 4 extremities. Psychiatric: Normal mood and affect.  Pertinent Imaging: EXAM: ABDOMEN ULTRASOUND COMPLETE   COMPARISON:  CT abdomen and pelvis 08/25/2023   FINDINGS: Gallbladder: Surgically absent   Common bile duct: Diameter: 6 mm   Liver: Increased echogenicity. No focal lesion. Portal vein is patent on color Doppler imaging with normal direction of blood flow towards the liver.   IVC: No abnormality visualized.   Pancreas: Visualized portion unremarkable.   Spleen: Size and appearance within normal limits.   Right Kidney: Length: 11.6 cm. Normal renal cortical thickness and echogenicity. No hydronephrosis. There is a 2.1 cm cyst. Additionally there is a 3.0 x 2.7 x 5.0 cm possible hypoechoic mass midpole right kidney.   Left Kidney: Length: 12.3 cm. Normal renal cortical thickness and echogenicity. No hydronephrosis. There is a 2.1 cm cyst and a 2.4 cm cyst. No imaging follow-up needed.   Abdominal aorta: No aneurysm visualized.   Other findings: None.   IMPRESSION: 1. There is a possible hypoechoic mass midpole right kidney. Recommend further evaluation with pre  and post contrast-enhanced  abdominal MRI or renal protocol CT. 2. Increased hepatic parenchymal echogenicity suggestive of steatosis. 3. No hydronephrosis. 4. These results will be called to the ordering clinician or representative by the Radiologist Assistant, and communication documented in the PACS or [employer].     Electronically Signed   By: Annia Belt M.D.   On: 11/22/2023 11:29  This was personally reviewed and I agree with the radiologic interpretation.   EXAM: CT ABDOMEN AND PELVIS WITH CONTRAST   TECHNIQUE: Multidetector CT imaging of the abdomen and pelvis was performed using the standard protocol following bolus administration of intravenous contrast.   RADIATION DOSE REDUCTION: This exam was performed according to the departmental dose-optimization program which includes automated exposure control, adjustment of the mA and/or kV according to patient size and/or use of iterative reconstruction technique.   CONTRAST:  Oral contrast and ISOVUE-300 IOPAMIDOL (ISOVUE-300) INJECTION 61%   COMPARISON:  04/02/2013   FINDINGS: Lower chest: Lung bases clear.  No pleural or pericardial effusion.   Hepatobiliary: No focal liver abnormality is seen. Status post cholecystectomy. No biliary dilatation.   Pancreas: Fatty atrophy. No focal lesions or peripancreatic inflammatory changes.   Spleen: Normal in size without focal abnormality.   Adrenals/Urinary Tract: No adrenal lesions. Left-sided renal cysts measure 2.2 cm and 1.8 cm. Right kidney cyst measures 2.0 cm. These cysts do not need to be followed up with imaging. No hydronephrosis or nephrolithiasis. No stones in the ureters. Unremarkable urinary bladder.   Stomach/Bowel: Stomach is within normal limits. Appendix not seen and no evidence of appendicitis. No evidence of bowel wall thickening, distention, or inflammatory changes.   Vascular/Lymphatic: Aortic atherosclerosis. No enlarged abdominal  or pelvic lymph nodes.   Reproductive: Prostate is unremarkable.   Other: No abdominal wall hernia or abnormality. No abdominopelvic ascites.   Musculoskeletal: Thoracolumbosacral degenerative changes. Posterior pedicle fusion and discectomies throughout the L-spine.   IMPRESSION: 1. Bilateral renal cysts. 2. Thoracolumbosacral degenerative and postop changes. 3. Atheromatous calcifications. 4. No acute abdominal or pelvic pathology.     Electronically Signed   By: Layla Maw M.D.   On: 09/03/2023 15:11  This was personally reviewed and I agree with the radiologic interpretation.   EXAM: CT LUMBAR SPINE WITHOUT CONTRAST   TECHNIQUE: Multidetector CT imaging of the lumbar spine was performed without intravenous contrast administration. Multiplanar CT image reconstructions were also generated.   RADIATION DOSE REDUCTION: This exam was performed according to the departmental dose-optimization program which includes automated exposure control, adjustment of the mA and/or kV according to patient size and/or use of iterative reconstruction technique.   COMPARISON:  Whole-body bone scan 07/08/2021. Lumbar MRI 11/18/2020 and lumbar CT 11/09/2017.   FINDINGS: Segmentation: There are 5 lumbar type vertebral bodies.   Alignment: Stable straightening of the usual lumbar lordosis minimal chronic retrolisthesis at L3-4.   Vertebrae: No acute osseous findings. Extensive postsurgical changes are stable from the 2018 CT, further detailed at the individual disc space levels below.   Paraspinal and other soft tissues: No acute paraspinal findings. Aortic and branch vessel atherosclerosis. Chronic fatty atrophy of the erector spinae musculature inferiorly.   Disc levels:   T11-12: Disc bulging with endplate osteophytes anteriorly and on the left. No significant spinal stenosis or nerve root encroachment.   T12-L1: Disc bulging with endplate osteophytes. No spinal  stenosis or nerve root encroachment.   L1-2: Status post XLIF with left-sided pedicle screws and interconnecting rod. Intact hardware without loosening. Solid interbody ankylosis. No significant spinal stenosis.  L2-3: Status post XLIF with left lateral plate and screws. Intact hardware without loosening. Solid interbody ankylosis. No significant spinal stenosis.   L3-4: Previous bilateral ray cage fusion with bilateral pedicle screws and rods. Chronic loosening of the bilateral L4 pedicle screws and probably the left L3 pedicle screw which extends into the L2-3 disc. Chronic findings of nonunion with vacuum disc phenomenon. No displacement of the cages. Previous right laminectomy and partial facetectomy without significant spinal stenosis. Mild chronic foraminal narrowing bilaterally.   L4-5: Solid interbody fusion post bilateral ray cage fusion and left partial facetectomy. No significant spinal stenosis.   L5-S1: Stable solid interbody fusion post bilateral rib cage fusion and left partial facetectomy. Chronic mild left greater than right foraminal narrowing. No significant spinal stenosis.   IMPRESSION: 1. Stable appearance of the lumbar spine compared with previous CT from 11/09/2017. 2. Chronic loosening of the bilateral L4 pedicle screws and probably the left L3 pedicle screw which extends into the L2-3 disc. 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 3. Solid interbody fusion and intact hardware at L1-2, L2-3, L4-5 and L5-S1. 4. No acute osseous findings or significant spinal stenosis. Chronic left greater than right foraminal narrowing at L5-S1. 5.  Aortic Atherosclerosis (ICD10-I70.0). 6. See separate lumbar MRI report.     Electronically Signed   By: Carey Bullocks M.D.   On: 12/10/2022 17:35  This was personally reviewed and I agree with the radiologic interpretation   Assessment & Plan:    1. Possible renal mass - Previous CT scans do not  demonstrate this mass in question, just a simple cyst, and I think it is probably technique related rather than a real finding - I also reviewed the lumbar spine from a year ago that again does have partial imaging of his kidneys that are also unremarkable other than for the cysts in question - Likely and artifactual finding on ultrasound rather than real, based on previous scans and underwhelming appearance - We can certainly characterize this further with an MR of the abdomen with or without contrast to ensure there is not an underlying mass. - I will offer this to him today for reassurance. - Suspicion for pathology is very low - We will call him with the results  2. Scrotal pain - Likely related to fluid overload rather than infection or other pathology, as previous ultrasound was unremarkable and antibiotics did not alleviate symptoms. - Recommend wearing tight-fitting underwear to reduce fluid accumulation in the scrotum.  - Consider referral to a testicular pain specialist if symptoms persist, although the issue appears to be related to fluid management.  Return for discussion of MR results.  I have reviewed the above documentation for accuracy and completeness, and I agree with the above.   Vanna Scotland, MD   [facility] [address] [place], [place] [address] [phone]

## 2023-12-05 ENCOUNTER — Ambulatory Visit: Payer: Medicare Other

## 2023-12-05 DIAGNOSIS — R208 Other disturbances of skin sensation: Secondary | ICD-10-CM | POA: Diagnosis not present

## 2023-12-05 DIAGNOSIS — D485 Neoplasm of uncertain behavior of skin: Secondary | ICD-10-CM | POA: Diagnosis not present

## 2023-12-05 DIAGNOSIS — D2261 Melanocytic nevi of right upper limb, including shoulder: Secondary | ICD-10-CM | POA: Diagnosis not present

## 2023-12-05 DIAGNOSIS — D2262 Melanocytic nevi of left upper limb, including shoulder: Secondary | ICD-10-CM | POA: Diagnosis not present

## 2023-12-05 DIAGNOSIS — L821 Other seborrheic keratosis: Secondary | ICD-10-CM | POA: Diagnosis not present

## 2023-12-05 DIAGNOSIS — D225 Melanocytic nevi of trunk: Secondary | ICD-10-CM | POA: Diagnosis not present

## 2023-12-07 ENCOUNTER — Ambulatory Visit: Payer: Medicare Other

## 2023-12-07 ENCOUNTER — Encounter: Payer: Self-pay | Admitting: Nurse Practitioner

## 2023-12-07 ENCOUNTER — Ambulatory Visit: Payer: Medicare Other | Attending: Nurse Practitioner | Admitting: Nurse Practitioner

## 2023-12-07 ENCOUNTER — Other Ambulatory Visit
Admission: RE | Admit: 2023-12-07 | Discharge: 2023-12-07 | Disposition: A | Discharge status: Home / Self Care | Payer: Medicare Other | Source: Ambulatory Visit | Attending: Nurse Practitioner | Admitting: Nurse Practitioner

## 2023-12-07 VITALS — BP 115/72 | HR 42 | Ht 70.0 in | Wt 267.8 lb

## 2023-12-07 DIAGNOSIS — I5033 Acute on chronic diastolic (congestive) heart failure: Secondary | ICD-10-CM | POA: Insufficient documentation

## 2023-12-07 DIAGNOSIS — I483 Typical atrial flutter: Secondary | ICD-10-CM | POA: Diagnosis not present

## 2023-12-07 DIAGNOSIS — I10 Essential (primary) hypertension: Secondary | ICD-10-CM | POA: Diagnosis not present

## 2023-12-07 DIAGNOSIS — N183 Chronic kidney disease, stage 3 unspecified: Secondary | ICD-10-CM | POA: Insufficient documentation

## 2023-12-07 LAB — BASIC METABOLIC PANEL
Anion gap: 12 (ref 5–15)
BUN: 36 mg/dL — ABNORMAL HIGH (ref 8–23)
CO2: 29 mmol/L (ref 22–32)
Calcium: 8.7 mg/dL — ABNORMAL LOW (ref 8.9–10.3)
Chloride: 98 mmol/L (ref 98–111)
Creatinine, Ser: 1.67 mg/dL — ABNORMAL HIGH (ref 0.61–1.24)
GFR, Estimated: 43 mL/min — ABNORMAL LOW (ref >60)
Glucose, Bld: 129 mg/dL — ABNORMAL HIGH (ref 70–99)
Potassium: 4.5 mmol/L (ref 3.5–5.1)
Sodium: 139 mmol/L (ref 135–145)

## 2023-12-07 MED ORDER — AMIODARONE HCL 200 MG PO TABS: 200.0000 mg | ORAL_TABLET | Freq: Two times a day (BID) | ORAL | 0 refills | Status: DC

## 2023-12-07 NOTE — Progress Notes (Signed)
Office Visit    Patient Name: Jerry Parsons Date of Encounter: 12/07/2023  Primary Care Provider:  Bosie Clos, MD Primary Cardiologist:  Julien Nordmann, MD  Chief Complaint    72 y.o. male with a history of persistent atrial flutter on amiodarone and Eliquis, diabetes, hypertension, hyperlipidemia, obesity, sleep apnea, stage II-III chronic kidney disease, depression, chronic back pain, spinal stenosis, osteoarthritis, TIA, and peripheral neuropathy, who presents for follow-up related to dyspnea and lower extremity edema.  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    x2   GERD (gastroesophageal reflux disease)    Headache    h/o as a child   Hypercholesteremia    Hypertension    Kidney failure    acute   Neuromuscular disorder (HCC)    Neuropathy    Pneumonia    time 9 ;last episode 12/2015   Pulmonary embolus (HCC) 2011   Seasonal allergies    Seizures (HCC)    as child    Sleep apnea    BIPAP   Stiff neck    limited turning s/p titanium plate placement   TIA (transient ischemic attack)    Wears dentures    full upper and lower   Past Surgical History:  Procedure Laterality Date   BACK SURGERY     x 8; upper x 3 & lower x 5   CARDIOVERSION  03/14/13, 10/16   2014 - [facility], 2016 - [facility]   CARDIOVERSION N/A 09/11/2020   Procedure: CARDIOVERSION;  Surgeon: Antonieta Iba, MD;  Location: [facility];  Service: Cardiovascular;  Laterality: N/A;   CATARACT EXTRACTION W/PHACO Left 10/29/2015   Procedure: CATARACT EXTRACTION PHACO AND INTRAOCULAR LENS PLACEMENT (IOC);  Surgeon: Lockie Mola, MD;  Location: [facility];   Service: Ophthalmology;  Laterality: Left;  DIABETIC - insulin and oral medsSleep apnea - no machine   CATARACT EXTRACTION W/PHACO Right 02/16/2023   Procedure: CATARACT EXTRACTION PHACO AND INTRAOCULAR LENS PLACEMENT (IOC) RIGHT DIABETIC MALYUGIN HEALON 5 VISION BLUE  65.52  03:57.7;  Surgeon: Lockie Mola, MD;  Location: [facility];  Service: Ophthalmology;  Laterality: Right;  Diabetic   CHOLECYSTECTOMY     COLONOSCOPY WITH PROPOFOL N/A 01/16/2018   Procedure: COLONOSCOPY WITH PROPOFOL;  Surgeon: Scot Jun, MD;  Location: [facility];  Service: Endoscopy;  Laterality: N/A;   ESOPHAGOGASTRODUODENOSCOPY (EGD) WITH PROPOFOL N/A 01/16/2018   Procedure: ESOPHAGOGASTRODUODENOSCOPY (EGD) WITH PROPOFOL;  Surgeon: Scot Jun, MD;  Location: [facility] END[facility];  Service: Endoscopy;  Laterality: N/A;   EYE SURGERY     GALLBLADDER SURGERY  2002   JOINT REPLACEMENT Right 2018   KNEE ARTHROSCOPY     left    PARATHYROIDECTOMY N/A 07/25/2020   Procedure: PARATHYROIDECTOMY;  Surgeon: Duanne Guess, MD;  Location: [facility];  Service: General;  Laterality: N/A;   ROTATOR CUFF REPAIR  2001   left    TEE WITHOUT CARDIOVERSION N/A 04/26/2018   Procedure: TRANSESOPHAGEAL ECHOCARDIOGRAM (TEE);  Surgeon: Yvonne Kendall, MD;  Location: [facility];  Service: Cardiovascular;  Laterality: N/A;   TONSILLECTOMY  TOTAL KNEE ARTHROPLASTY Right 06/20/2017   Procedure: RIGHT TOTAL KNEE ARTHROPLASTY;  Surgeon: Ollen Gross, MD;  Location: [facility];  Service: Orthopedics;  Laterality: Right;    Allergies  Allergies  Allergen Reactions   Carisoprodol Itching      History of Present Illness      72 y.o. y/o male with a history of persistent atrial flutter, diabetes, hypertension, hyperlipidemia, obesity, sleep apnea, stage II-III chronic kidney disease, depression, chronic back pain, spinal stenosis, osteoarthritis, TIA, and peripheral neuropathy.  He was initially diagnosed with atrial  flutter in March 2014, and subsequently underwent cardioversion.  He was hospitalized in June 2021 with recurrent atrial flutter and required cardioversion in September 2021.  Echocardiogram at that time showed an EF of 60-65% with mild LVH, and moderately dilated left atrium.  He has since been managed with amiodarone and Eliquis.  In October 2024, he was admitted with cough, congestion, and community-acquired pneumonia.  ECG notable for rate controlled atrial flutter.  CTA of the chest was negative for PE, but did show bilateral lower lobe and right middle lobe airspace opacities concerning for edema versus pneumonia.  He was treated with intravenous antibiotics and subsequently discharged October 15, 2023.  At pulmonology office follow-up in November 29, 2023, patient complained of worsening dyspnea and 30 pound weight gain with significant bilateral lower extremity edema.  He was advised to follow-up here.    Pt notes that he has chronic bilat lower ext edema with skin discoloration consistent with chronic venous stasis.  Since about May of this year or so, he has had some worsening of his swelling though his weights are typically stable in the mid 260s to low 270 pounds range.  Following his recent hospitalization however, his weight jumped from 263 pounds on November 5 primary care visit, up to 285 pounds on November 12 pulmonology visit.  Patient had been taking furosemide 40 mg daily at that time and noted worsening dyspnea orthopnea, lower extremity edema, and scrotal edema.  He was switched from furosemide to torsemide with improvement in weight, down to 273 pounds at December 4 primary care visit, though ongoing dyspnea and lower extremity/scrotal edema.  He remains in atrial flutter today.  His orthopnea has improved and he denies chest pain, PND, palpitations, dizziness, or syncope.  He does have early satiety. Objective  Home Medications    Current Outpatient Medications  Medication Sig  Dispense Refill   Continuous Blood Gluc Receiver (FREESTYLE LIBRE 2 READER) DEVI Inject 1 Device into the skin daily. DGX 1 each 3   Continuous Blood Gluc Sensor (FREESTYLE LIBRE 2 SENSOR) MISC 2 Devices by Does not apply route daily. 2 each 0   dutasteride (AVODART) 0.5 MG capsule Take 0.5 mg by mouth daily.     ELIQUIS 5 MG TABS tablet TAKE 1 TABLET TWICE A DAY 180 tablet 1   FARXIGA 10 MG TABS tablet TAKE 1 TABLET BY MOUTH EVERY DAY 90 tablet 3   fexofenadine (ALLEGRA) 180 MG tablet Take 180 mg by mouth daily.     gabapentin (NEURONTIN) 100 MG capsule Take 100 mg by mouth 2 (two) times daily.     gabapentin (NEURONTIN) 300 MG capsule Take 1 capsule (300 mg total) by mouth at bedtime. 90 capsule 1   Insulin Glargine (BASAGLAR KWIKPEN) 100 UNIT/ML INJECT 40 UNITS            SUBCUTANEOUSLY TWO TIMES A DAY 45 mL 1   metoprolol tartrate (LOPRESSOR) 100 MG tablet TAKE  1 TABLET BY MOUTH TWICE A DAY 180 tablet 4   naloxone (NARCAN) nasal spray 4 mg/0.1 mL Please use in the event of overdose from narcotic medication 1 each 1   NOVOLOG FLEXPEN 100 UNIT/ML FlexPen INJECT 14 UNITS            SUBCUTANEOUSLY IN THE      MORNING AND AT BEDTIME 15 mL 6   Omega-3 Fatty Acids (FISH OIL) 1200 MG CAPS Take 1,200 mg by mouth 2 (two) times daily.      ONETOUCH ULTRA test strip USE WITH METER TWICE DAILY 100 strip 6   oxyCODONE (OXYCONTIN) 40 mg 12 hr tablet Take 1 tablet (40 mg total) by mouth every 8 (eight) hours as needed. 90 tablet 0   pregabalin (LYRICA) 200 MG capsule TAKE 1 CAPSULE 3 TIMES A   DAY 270 capsule 1   rOPINIRole (REQUIP) 1 MG tablet TAKE 1 TO 2 TABLETS BY MOUTH AT BEDTIME AS NEEDED (Patient taking differently: Take 1-2 mg by mouth 2 (two) times daily.) 180 tablet 1   rOPINIRole (REQUIP) 2 MG tablet TAKE 1 TABLET BY MOUTH TWICE A DAY AS NEEDED (Patient taking differently: Take 2 mg by mouth at bedtime.) 180 tablet 0   spironolactone (ALDACTONE) 25 MG tablet Take 1 tablet by mouth daily.      tamsulosin (FLOMAX) 0.4 MG CAPS capsule Take 0.4 mg by mouth daily.     torsemide (DEMADEX) 20 MG tablet Take 20 mg by mouth daily.     UNABLE TO FIND Take 1 tablet by mouth 2 (two) times daily. Med Name: Copacetic     VITAMIN D PO Take by mouth. 1 tablet once daily in the morning     amiodarone (PACERONE) 200 MG tablet Take 1 tablet (200 mg total) by mouth 2 (two) times daily. 60 tablet 0   ferrous sulfate 325 (65 FE) MG tablet Take 1 tablet by mouth daily. (Patient not taking: Reported on 12/07/2023)     magnesium oxide (MAG-OX) 400 MG tablet TAKE 1 TABLET BY MOUTH TWICE A DAY (Patient not taking: Reported on 12/07/2023) 180 tablet 3   No current facility-administered medications for this visit.     Physical Exam    VS:  BP 115/72 (BP Location: Left Arm, Patient Position: Sitting, Cuff Size: Large)   Pulse (!) 42   Ht 5\' 10"  (1.778 m)   Wt 267 lb 12.8 oz (121.5 kg)   SpO2 96%   BMI 38.43 kg/m  , BMI Body mass index is 38.43 kg/m.       GEN: Obese, in no acute distress. HEENT: normal. Neck: Supple, moderately elevated JVP.  No bruits or masses.   Cardiac: Irregularly irregular, no murmurs, rubs, or gallops. No clubbing, cyanosis, 2-3+ bilateral lower extremity woody edema extending up to the hips.  Radials 2+/PT 2+ and equal bilaterally.  Respiratory:  Respirations regular and unlabored, left basilar crackles. GI: Protuberant and firm, nontender.  Bowel sounds present x 4.   MS: no deformity or atrophy. Skin: Skin discoloration of lower extremities consistent with chronic venous stasis. Neuro:  Strength and sensation are intact. Psych: Normal affect.  Accessory Clinical Findings    ECG personally reviewed by me today - EKG Interpretation Date/Time:  Wednesday December 07 2023 16:27:35 EST Ventricular Rate:  81 PR Interval:    QRS Duration:  106 QT Interval:  392 QTC Calculation: 455 R Axis:   48  Text Interpretation: Atrial flutter with variable A-V block Incomplete  right bundle branch block Nonspecific ST and T wave abnormality Confirmed by Nicolasa Ducking [phone]) on 12/07/2023 5:10:38 PM  - no acute changes.  Lab Results  Component Value Date   WBC 5.9 10/15/2023   HGB 11.4 (L) 10/15/2023   HCT 37.0 (L) 10/15/2023   MCV 84.3 10/15/2023   PLT 103 (L) 10/15/2023   Lab Results  Component Value Date   CREATININE 1.26 (H) 10/15/2023   BUN 27 (H) 10/15/2023   NA 140 10/15/2023   K 4.1 10/15/2023   CL 100 10/15/2023   CO2 29 10/15/2023   Lab Results  Component Value Date   ALT 19 05/27/2023   AST 21 05/27/2023   ALKPHOS 75 05/27/2023   BILITOT 0.6 05/27/2023   Lab Results  Component Value Date   CHOL 143 06/08/2022   HDL 23 (L) 06/08/2022   LDLCALC 73 06/08/2022   TRIG 292 (H) 06/08/2022   CHOLHDL 6.2 (H) 06/08/2022    Lab Results  Component Value Date   HGBA1C CANCELED 12/29/2022   Lab Results  Component Value Date   TSH 6.660 (H) 06/08/2022       Assessment & Plan    1.  Acute on chronic heart failure: Patient with historically preserved ejection fraction.  He was hospitalized with dyspnea and October, and was treated for pneumonia.  Imaging at that time notable for possible edema.  Despite treat with antibiotics, he is continue to have dyspnea on exertion and subsequently had significant weight gain prompting transitioning from furosemide to torsemide.  He did have initial improvement in lower extremity swelling on 20 mg of torsemide and his weight is now closer to his prior baseline earlier this year, though he still markedly volume overloaded on examination.  Since his hospitalization October, it appears he has been in atrial flutter.  Loss of atrial kick may be driving his heart failure exacerbation.  I will follow-up a basic metabolic panel today.  Provided that renal function is stable, we will look to titrate torsemide for further diuresis and have him follow-up in 2 weeks.  Ultimately, he he will require cardioversion once  volume status improved.  In that setting, I am increasing the amiodarone to 200 mg twice daily today.  We should also plan a follow-up echo once sinus rhythm restored.  Continue beta-blocker, spironolactone, and Farxiga.  2.  Persistent atrial flutter: Status post prior cardioversions.  He is chronically anticoagulated with Eliquis and has been taking amiodarone daily.  When hospitalized with respiratory failure in October, ECG showed atrial flutter, which was rate controlled.  He remains in atrial flutter today.  As outlined above, I suspect this is contributing to his heart failure.  Increasing amiodarone to 200 mg twice daily with plan for follow-up in the office in 2 weeks and if he remains in atrial flutter, we will pursue cardioversion.  Continue Eliquis.  3.  Primary hypertension: Stable at 115/72.  Continue beta-blocker and spironolactone.  4.  Obstructive sleep apnea: Compliant with CPAP.  5.  Stage II-III chronic kidney disease: Follow-up basic metabolic panel today to reevaluate renal function and electrolytes in the setting of torsemide therapy.  6.  Preprocedure evaluation: Patient is pending elective colonoscopy next Monday, December 16.  In light of acute on chronic heart failure with dyspnea, edema, and arrhythmia requiring ongoing anticoagulation, I recommended that he delay his colonoscopy.  In addition to risk of procedure in the setting of heart failure, interruption of anticoagulation will only serve to delay restoration of  sinus rhythm.  Patient plans to reach out to his gastroenterologist.  7.  Disposition: Follow-up basic metabolic panel.  Follow-up in clinic in 2 weeks to reevaluate volume status and rhythm.  Nicolasa Ducking, NP 12/07/2023, 5:47 PM

## 2023-12-07 NOTE — Patient Instructions (Addendum)
Medication Instructions:  INCREASE the Amiodarone to 200 mg twice daily  *If you need a refill on your cardiac medications before your next appointment, please call your pharmacy*   Lab Work: Your provider would like for you to return tomorrow to have the following labs drawn: BMET.   Please go to the [facility] entrance and check in at the front desk.  You do not need an appointment.  They are open from 7am-6 pm.  You will not need to be fasting.  If you have labs (blood work) drawn today and your tests are completely normal, you will receive your results only by: MyChart Message (if you have MyChart) OR A paper copy in the mail If you have any lab test that is abnormal or we need to change your treatment, we will call you to review the results.   Testing/Procedures: None ordered   Follow-Up: At [facility], you and your health needs are our priority.  As part of our continuing mission to provide you with exceptional heart care, we have created designated Provider Care Teams.  These Care Teams include your primary Cardiologist (physician) and Advanced Practice Providers (APPs -  Physician Assistants and Nurse Practitioners) who all work together to provide you with the care you need, when you need it.  We recommend signing up for the patient portal called "MyChart".  Sign up information is provided on this After Visit Summary.  MyChart is used to connect with patients for Virtual Visits (Telemedicine).  Patients are able to view lab/test results, encounter notes, upcoming appointments, etc.  Non-urgent messages can be sent to your provider as well.   To learn more about what you can do with MyChart, go to [email or web address].    Your next appointment:   Keep your follow up on 12/27 with Dr. Mariah Milling

## 2023-12-08 ENCOUNTER — Other Ambulatory Visit: Payer: Self-pay | Admitting: *Deleted

## 2023-12-08 DIAGNOSIS — I5032 Chronic diastolic (congestive) heart failure: Secondary | ICD-10-CM

## 2023-12-12 ENCOUNTER — Ambulatory Visit: Admit: 2023-12-12 | Payer: Medicare Other | Admitting: Gastroenterology

## 2023-12-12 DIAGNOSIS — C44529 Squamous cell carcinoma of skin of other part of trunk: Secondary | ICD-10-CM | POA: Diagnosis not present

## 2023-12-12 SURGERY — COLONOSCOPY WITH PROPOFOL
Anesthesia: General

## 2023-12-13 ENCOUNTER — Other Ambulatory Visit
Admission: RE | Admit: 2023-12-13 | Discharge: 2023-12-13 | Disposition: A | Discharge status: Home / Self Care | Payer: Medicare Other | Source: Ambulatory Visit | Attending: Nurse Practitioner | Admitting: Nurse Practitioner

## 2023-12-13 DIAGNOSIS — I5032 Chronic diastolic (congestive) heart failure: Secondary | ICD-10-CM | POA: Insufficient documentation

## 2023-12-13 LAB — BASIC METABOLIC PANEL
Anion gap: 14 (ref 5–15)
BUN: 52 mg/dL — ABNORMAL HIGH (ref 8–23)
CO2: 32 mmol/L (ref 22–32)
Calcium: 8.4 mg/dL — ABNORMAL LOW (ref 8.9–10.3)
Chloride: 90 mmol/L — ABNORMAL LOW (ref 98–111)
Creatinine, Ser: 2.18 mg/dL — ABNORMAL HIGH (ref 0.61–1.24)
GFR, Estimated: 31 mL/min — ABNORMAL LOW (ref >60)
Glucose, Bld: 341 mg/dL — ABNORMAL HIGH (ref 70–99)
Potassium: 4.1 mmol/L (ref 3.5–5.1)
Sodium: 136 mmol/L (ref 135–145)

## 2023-12-14 NOTE — Addendum Note (Signed)
Addended by: Sandi Mariscal on: 12/14/2023 10:09 AM   Modules accepted: Orders

## 2023-12-16 ENCOUNTER — Other Ambulatory Visit
Admission: RE | Admit: 2023-12-16 | Discharge: 2023-12-16 | Disposition: A | Discharge status: Home / Self Care | Payer: Medicare Other | Source: Ambulatory Visit | Attending: Nurse Practitioner | Admitting: Nurse Practitioner

## 2023-12-16 DIAGNOSIS — I5032 Chronic diastolic (congestive) heart failure: Secondary | ICD-10-CM | POA: Diagnosis not present

## 2023-12-16 LAB — BASIC METABOLIC PANEL
Anion gap: 10 (ref 5–15)
BUN: 35 mg/dL — ABNORMAL HIGH (ref 8–23)
CO2: 29 mmol/L (ref 22–32)
Calcium: 8.1 mg/dL — ABNORMAL LOW (ref 8.9–10.3)
Chloride: 102 mmol/L (ref 98–111)
Creatinine, Ser: 1.59 mg/dL — ABNORMAL HIGH (ref 0.61–1.24)
GFR, Estimated: 46 mL/min — ABNORMAL LOW (ref >60)
Glucose, Bld: 245 mg/dL — ABNORMAL HIGH (ref 70–99)
Potassium: 4.5 mmol/L (ref 3.5–5.1)
Sodium: 141 mmol/L (ref 135–145)

## 2023-12-16 MED ORDER — TORSEMIDE 20 MG PO TABS: ORAL_TABLET | ORAL | 0 refills | Status: DC

## 2023-12-16 NOTE — Addendum Note (Signed)
Addended by: Sandi Mariscal on: 12/16/2023 04:32 PM   Modules accepted: Orders

## 2023-12-17 ENCOUNTER — Ambulatory Visit
Admission: RE | Admit: 2023-12-17 | Discharge: 2023-12-17 | Disposition: A | Discharge status: Home / Self Care | Payer: Medicare Other | Source: Ambulatory Visit | Attending: Urology | Admitting: Urology

## 2023-12-17 DIAGNOSIS — I700 Atherosclerosis of aorta: Secondary | ICD-10-CM | POA: Diagnosis not present

## 2023-12-17 DIAGNOSIS — N2889 Other specified disorders of kidney and ureter: Secondary | ICD-10-CM | POA: Insufficient documentation

## 2023-12-17 DIAGNOSIS — Z9049 Acquired absence of other specified parts of digestive tract: Secondary | ICD-10-CM | POA: Diagnosis not present

## 2023-12-17 DIAGNOSIS — N281 Cyst of kidney, acquired: Secondary | ICD-10-CM | POA: Diagnosis not present

## 2023-12-17 MED ORDER — GADOBUTROL 1 MMOL/ML IV SOLN
10.0000 mL | Freq: Once | INTRAVENOUS | Status: AC | PRN
  Administered 2023-12-17: 10 mL via INTRAVENOUS

## 2023-12-18 ENCOUNTER — Other Ambulatory Visit: Payer: Self-pay | Admitting: Podiatry

## 2023-12-19 NOTE — Progress Notes (Deleted)
Cardiology Office Note  Date:  12/19/2023   ID:  Jerry Parsons, Jerry Parsons 1951-08-28, MRN [id number]  PCP:  Bosie Clos, MD   No chief complaint on file.   HPI:  [name] is a  72 year old gentleman, multiple back surgeries,  spinal stenosis , s/p arthroscopic knee surgery, total knee replacement left obesity,  diabetes ,  DVT, PE in March 2011 previously on warfarin,  30 year smoking history,   neuropathy hypertension,  depression,  renal disease,  obstructive sleep apnea on CPAP,  chronic sweating at nighttime which he attributes to the Vicodin. Prior severe pneumonia, ATN Remote atrial flutter dating back to 2014, fibrillation, last episode in 2015.  Flutter with cardioversion 08/2020 presenting for routine followup of his hypertension , atrial flutter  Last seen by myself in clinic May 2023 In follow-up today reports having worsening back pain Feels that he needs back surgery MRI December 2023 of back with no acute changes to explain his symptoms Reports he is willing to try PT and cortisones  Lab work reviewed A1C 7.2 to 8.0 Normal BMP  Recently missed Lasix for 2 days secondary to medical appointments, appreciated some abdominal fullness Back on his Lasix 40 daily SOB once in a while, stable Denies chest pain on exertion concerning for angina  Unable to sit with legs up secondary to chronic back pain Feels his leg swelling is stable  Labs from 6 months ago A1c 7.3, creatinine 1.28 potassium 4.1  EKG personally reviewed by myself on todays visit Normal sinus rhythm rate 68 bpm no significant ST-T wave changes  Other past medical history reviewed  Echocardiogram June 2021  mild LVH otherwise normal LV size and function   total cholesterol 134 LDL 67   hospitalization June 05, 2020  atrial flutter Underwent cardioversion, 09/11/2020 NSR restored  Chronic back pain 9 back surgeries, previously thought about  spinal cord stimulator  Admission  5/18 for sepsis, salmonella diarrhea, per the discharge summary From eggs, per the patient He did not receive antibiotics  pneum[place] the [place] January 2017 He was in ICU for 3 days, long recovery Lost more than 30 pounds,  Lipid panel done while he was very ill early 2017, total cholesterol at that time 89  Denies any atrial fibrillation through his hospital course, reports he was changed to amiodarone IV infusion and then back to pill when he was tolerating oral medications  Previous event where he had buckling of his knee, fall, contusion, TIA-type symptoms that took him to the hospital. He was kept overnight and most of his workup was relatively unrevealing including MRI/MRA, carotid ultrasound, echocardiogram.   History of atrial flutter, status post cardioversion on 03/14/2013.   2 admissions to the hospital for gastroenteritis/ campylobacter infection. Required a long course of antibiotics. Possible  infection from tainted chicken.   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  PSH:    Past Surgical History:  Procedure Laterality Date   BACK SURGERY     x 8; upper x 3 & lower x 5   CARDIOVERSION  03/14/13, 10/16   2014 - [facility], 2016 [facility]  CARDIOVERSION N/A 09/11/2020   Procedure: CARDIOVERSION;  Surgeon: Antonieta Iba, MD;  Location: [facility] ORS;  Service: Cardiovascular;  Laterality: N/A;   CATARACT EXTRACTION W/PHACO Left 10/29/2015   Procedure: CATARACT EXTRACTION PHACO AND  INTRAOCULAR LENS PLACEMENT (IOC);  Surgeon: Lockie Mola, MD;  Location: [facility];  Service: Ophthalmology;  Laterality: Left;  DIABETIC - insulin and oral medsSleep apnea - no machine   CATARACT EXTRACTION W/PHACO Right 02/16/2023   Procedure: CATARACT EXTRACTION PHACO AND INTRAOCULAR LENS PLACEMENT (IOC) RIGHT DIABETIC MALYUGIN HEALON 5 VISION BLUE  65.52  03:57.7;  Surgeon: Lockie Mola, MD;  Location: [facility];  Service: Ophthalmology;  Laterality: Right;  Diabetic   CHOLECYSTECTOMY     COLONOSCOPY WITH PROPOFOL N/A 01/16/2018   Procedure: COLONOSCOPY WITH PROPOFOL;  Surgeon: Scot Jun, MD;  Location: [facility] END[facility];  Service: Endoscopy;  Laterality: N/A;   ESOPHAGOGASTRODUODENOSCOPY (EGD) WITH PROPOFOL N/A 01/16/2018   Procedure: ESOPHAGOGASTRODUODENOSCOPY (EGD) WITH PROPOFOL;  Surgeon: Scot Jun, MD;  Location: [facility];  Service: Endoscopy;  Laterality: N/A;   EYE SURGERY     GALLBLADDER SURGERY  2002   JOINT REPLACEMENT Right 2018   KNEE ARTHROSCOPY     left    PARATHYROIDECTOMY N/A 07/25/2020   Procedure: PARATHYROIDECTOMY;  Surgeon: Duanne Guess, MD;  Location: [facility] ORS;  Service: General;  Laterality: N/A;   ROTATOR CUFF REPAIR  2001   left    TEE WITHOUT CARDIOVERSION N/A 04/26/2018   Procedure: TRANSESOPHAGEAL ECHOCARDIOGRAM (TEE);  Surgeon: Yvonne Kendall, MD;  Location: [facility] OR[facility];  Service: Cardiovascular;  Laterality: N/A;   TONSILLECTOMY     TOTAL KNEE ARTHROPLASTY Right 06/20/2017   Procedure: RIGHT TOTAL KNEE ARTHROPLASTY;  Surgeon: Ollen Gross, MD;  Location: [facility];  Service: Orthopedics;  Laterality: Right;    Current Outpatient Medications  Medication Sig Dispense Refill   amiodarone (PACERONE) 200 MG tablet Take 1 tablet (200 mg total) by mouth 2 (two) times daily. 60 tablet 0   Continuous Blood Gluc Receiver (FREESTYLE LIBRE 2 READER) DEVI Inject 1 Device into the skin daily. DGX 1 each 3   Continuous  Blood Gluc Sensor (FREESTYLE LIBRE 2 SENSOR) MISC 2 Devices by Does not apply route daily. 2 each 0   dutasteride (AVODART) 0.5 MG capsule Take 0.5 mg by mouth daily.     ELIQUIS 5 MG TABS tablet TAKE 1 TABLET TWICE A DAY 180 tablet 1   FARXIGA 10 MG TABS tablet TAKE 1 TABLET BY MOUTH EVERY DAY 90 tablet 3   ferrous sulfate 325 (65 FE) MG tablet Take 1 tablet by mouth daily. (Patient not taking: Reported on 12/07/2023)     fexofenadine (ALLEGRA) 180 MG tablet Take 180 mg by mouth daily.     gabapentin (NEURONTIN) 100 MG capsule Take 100 mg by mouth 2 (two) times daily.     gabapentin (NEURONTIN) 300 MG capsule Take 1 capsule (300 mg total) by mouth at bedtime. 90 capsule 1   Insulin Glargine (BASAGLAR KWIKPEN) 100 UNIT/ML INJECT 40 UNITS            SUBCUTANEOUSLY TWO TIMES A DAY 45 mL 1   magnesium oxide (MAG-OX) 400 MG tablet TAKE 1 TABLET BY MOUTH TWICE A DAY (Patient not taking: Reported on 12/07/2023) 180 tablet 3   metoprolol tartrate (LOPRESSOR) 100 MG tablet TAKE 1 TABLET BY MOUTH TWICE A DAY 180 tablet 4   naloxone (NARCAN) nasal spray 4 mg/0.1 mL Please use in the event of overdose from narcotic  medication 1 each 1   NOVOLOG FLEXPEN 100 UNIT/ML FlexPen INJECT 14 UNITS            SUBCUTANEOUSLY IN THE      MORNING AND AT BEDTIME 15 mL 6   Omega-3 Fatty Acids (FISH OIL) 1200 MG CAPS Take 1,200 mg by mouth 2 (two) times daily.      ONETOUCH ULTRA test strip USE WITH METER TWICE DAILY 100 strip 6   oxyCODONE (OXYCONTIN) 40 mg 12 hr tablet Take 1 tablet (40 mg total) by mouth every 8 (eight) hours as needed. 90 tablet 0   pregabalin (LYRICA) 200 MG capsule TAKE 1 CAPSULE 3 TIMES A   DAY 270 capsule 1   rOPINIRole (REQUIP) 1 MG tablet TAKE 1 TO 2 TABLETS BY MOUTH AT BEDTIME AS NEEDED (Patient taking differently: Take 1-2 mg by mouth 2 (two) times daily.) 180 tablet 1   rOPINIRole (REQUIP) 2 MG tablet TAKE 1 TABLET BY MOUTH TWICE A DAY AS NEEDED (Patient taking differently: Take 2 mg by mouth at  bedtime.) 180 tablet 0   spironolactone (ALDACTONE) 25 MG tablet Take 1 tablet by mouth daily.     tamsulosin (FLOMAX) 0.4 MG CAPS capsule Take 0.4 mg by mouth daily.     torsemide (DEMADEX) 20 MG tablet Take one tablet as needed for a weight gain of 2 lbs in 24 hrs or 5 lbs in a week. 30 tablet 0   UNABLE TO FIND Take 1 tablet by mouth 2 (two) times daily. Med Name: Copacetic     VITAMIN D PO Take by mouth. 1 tablet once daily in the morning     No current facility-administered medications for this visit.    Allergies:   Carisoprodol   Social History:  The patient  reports that he quit smoking about 34 years ago. His smoking use included cigarettes. He started smoking about 54 years ago. He has a 40 pack-year smoking history. He has never used smokeless tobacco. He reports that he does not drink alcohol and does not use drugs.   Family History:   family history includes Heart attack in his brother.    Review of Systems: Review of Systems  Constitutional: Negative.   HENT: Negative.    Respiratory: Negative.    Cardiovascular: Negative.   Gastrointestinal: Negative.   Musculoskeletal:  Positive for back pain and joint pain.       Tingling foot pain  Neurological: Negative.   Psychiatric/Behavioral: Negative.    All other systems reviewed and are negative.   PHYSICAL EXAM: VS:  There were no vitals taken for this visit. , BMI There is no height or weight on file to calculate BMI. Constitutional:  oriented to person, place, and time. No distress.  HENT:  Head: Grossly normal Eyes:  no discharge. No scleral icterus.  Neck: No JVD, no carotid bruits  Cardiovascular: Regular rate and rhythm, no murmurs appreciated Pulmonary/Chest: Clear to auscultation bilaterally, no wheezes or rails Abdominal: Soft.  no distension.  no tenderness.  Musculoskeletal: Normal range of motion Neurological:  normal muscle tone. Coordination normal. No atrophy Skin: Skin warm and dry Psychiatric:  normal affect, pleasant  Recent Labs: 05/27/2023: ALT 19 10/15/2023: Hemoglobin 11.4; Platelets 103 12/16/2023: BUN 35; Creatinine, Ser 1.59; Potassium 4.5; Sodium 141    Lipid Panel Lab Results  Component Value Date   CHOL 143 06/08/2022   HDL 23 (L) 06/08/2022   LDLCALC 73 06/08/2022   TRIG 292 (H) 06/08/2022  Wt Readings from Last 3 Encounters:  12/07/23 267 lb 12.8 oz (121.5 kg)  12/02/23 270 lb (122.5 kg)  10/13/23 270 lb (122.5 kg)     ASSESSMENT AND PLAN:  Atrial flutter, unspecified type (HCC) - Plan: EKG 12-Lead Maintaining normal sinus rhythm,prior cardioversion 2021 On  anticoagulation/Eliquis, metoprolol, amiodarone Continue current therapy  HYPERTENSION, BENIGN -  Blood pressure is well controlled on today's visit. No changes made to the medications.  Paroxysmal atrial fibrillation (HCC) - Plan: EKG 12-Lead On amiodarone and Eliquis, metoprolol 100 twice daily atrial flutter on EKG since June 2021, required cardioversion Maintaining normal sinus rhythm since that time  Hyperlipidemia Reasonable numbers but has been trending upwards with weight gain Unable to exercise Recommend calorie restriction  Chronic diastolic CHF (congestive heart failure) (HCC) appears euvolemic On Lasix 40 daily,  Chronic stable brawny discoloration of lower extremity, unchanged  Uncontrolled type 2 diabetes mellitus with complication, with long-term current Calorie restriction recommended A1c running 7-8  Primary osteoarthritis of both knees Limited mobility secondary to back and leg discomfort, knee pain Reports he has delayed surgery on his knee to have his back evaluated  Leg swelling Significant venous component, recommended compression hose, Has difficulty with leg elevation secondary to chronic back pain  Neuropathy, chronic back pain chronic pain History of fusions Reports is scheduled for PT, has follow-up with neurosurgery If surgery is required, would  be acceptable risk from cardiac perspective No further cardiac testing would be needed   Total encounter time more than 30 minutes  Greater than 50% was spent in counseling and coordination of care with the patient    No orders of the defined types were placed in this encounter.    Signed, Dossie Arbour, M.D., Ph.D. 12/19/2023  [facility] [place], [place] [phone]

## 2023-12-23 ENCOUNTER — Ambulatory Visit: Payer: Medicare Other | Admitting: Cardiovascular Disease

## 2023-12-23 ENCOUNTER — Other Ambulatory Visit: Payer: Self-pay | Admitting: Cardiovascular Disease

## 2023-12-23 DIAGNOSIS — I483 Typical atrial flutter: Secondary | ICD-10-CM

## 2023-12-23 DIAGNOSIS — I5032 Chronic diastolic (congestive) heart failure: Secondary | ICD-10-CM

## 2023-12-23 DIAGNOSIS — N183 Chronic kidney disease, stage 3 unspecified: Secondary | ICD-10-CM

## 2023-12-23 DIAGNOSIS — E1142 Type 2 diabetes mellitus with diabetic polyneuropathy: Secondary | ICD-10-CM

## 2023-12-23 DIAGNOSIS — I700 Atherosclerosis of aorta: Secondary | ICD-10-CM

## 2023-12-23 DIAGNOSIS — E782 Mixed hyperlipidemia: Secondary | ICD-10-CM

## 2023-12-23 DIAGNOSIS — I10 Essential (primary) hypertension: Secondary | ICD-10-CM

## 2023-12-30 ENCOUNTER — Other Ambulatory Visit: Payer: Self-pay | Admitting: Nurse Practitioner

## 2024-01-05 ENCOUNTER — Other Ambulatory Visit: Payer: Self-pay | Admitting: Podiatry

## 2024-01-12 ENCOUNTER — Ambulatory Visit: Payer: Medicare Other | Admitting: Nurse Practitioner

## 2024-01-18 ENCOUNTER — Ambulatory Visit: Payer: Medicare Other | Attending: Physician Assistant | Admitting: Physician Assistant

## 2024-01-18 ENCOUNTER — Ambulatory Visit: Payer: Medicare Other | Admitting: Nurse Practitioner

## 2024-01-18 ENCOUNTER — Encounter: Payer: Self-pay | Admitting: Physician Assistant

## 2024-01-18 VITALS — BP 129/60 | HR 61 | Ht 70.0 in | Wt 275.0 lb

## 2024-01-18 DIAGNOSIS — I10 Essential (primary) hypertension: Secondary | ICD-10-CM

## 2024-01-18 DIAGNOSIS — I5033 Acute on chronic diastolic (congestive) heart failure: Secondary | ICD-10-CM

## 2024-01-18 DIAGNOSIS — I480 Paroxysmal atrial fibrillation: Secondary | ICD-10-CM

## 2024-01-18 DIAGNOSIS — I483 Typical atrial flutter: Secondary | ICD-10-CM

## 2024-01-18 DIAGNOSIS — G4733 Obstructive sleep apnea (adult) (pediatric): Secondary | ICD-10-CM | POA: Diagnosis present

## 2024-01-18 DIAGNOSIS — N183 Chronic kidney disease, stage 3 unspecified: Secondary | ICD-10-CM | POA: Diagnosis present

## 2024-01-18 MED ORDER — AMIODARONE HCL 200 MG PO TABS: 200.0000 mg | ORAL_TABLET | Freq: Every day | ORAL | 3 refills | Status: DC

## 2024-01-18 NOTE — Patient Instructions (Signed)
Medication Instructions:  Your physician recommends the following medication changes.  DECREASE: Amiodarone 200 mg daily    *If you need a refill on your cardiac medications before your next appointment, please call your pharmacy*   Lab Work: Your provider would like for you to have following labs drawn today CBC and BMeT.   If you have labs (blood work) drawn today and your tests are completely normal, you will receive your results only by: MyChart Message (if you have MyChart) OR A paper copy in the mail If you have any lab test that is abnormal or we need to change your treatment, we will call you to review the results.   Testing/Procedures:    Dear Jerry Parsons  You are scheduled for a Cardioversion on Wednesday, January 29 with Dr. Julien Nordmann.  Please arrive at the [facility] Entrance of [facility], [address], [place] [address] at 6:30 AM (This is 1 hour(s) prior to your procedure time).  Proceed to the Check-In Desk directly inside the entrance.  Procedure Parking: Use the entrance off of the [address] side of the hospital. Turn right upon entering and follow the driveway to parking that is directly in front of the [facility]. There is no valet parking available at this entrance, however there is an awning directly in front of the [facility] for drop off/ pick up for patients.   DIET:  Nothing to eat or drink after midnight except a sip of water with medications (see medication instructions below)  MEDICATION INSTRUCTIONS: !!IF ANY NEW MEDICATIONS ARE STARTED AFTER TODAY, PLEASE NOTIFY YOUR PROVIDER AS SOON AS POSSIBLE!!  FYI: Medications such as Semaglutide (Ozempic, [place]), Tirzepatide (Mounjaro, Zepbound), Dulaglutide (Trulicity), etc ("GLP1 agonists") AND Canagliflozin (Invokana), Dapagliflozin (Farxiga), Empagliflozin (Jardiance), Ertugliflozin (Steglatro), Bexagliflozin [employer]) or any combination with one of these  drugs such as Invokamet (Canagliflozin/Metformin), Synjardy (Empagliflozin/Metformin), etc ("SGLT2 inhibitors") must be held around the time of a procedure. This is not a comprehensive list of all of these drugs. Please review all of your medications and talk to your provider if you take any one of these. If you are not sure, ask your provider.    HOLD: Dapagliflozin Marcelline Deist) for 3 day prior to the procedure. Last dose on Saturday, January 25.   TAKE: Half dose of Insulin Glargine (22 units) the night before your procedure.  HOLD: Torsemide and Spironolactone the morning of your procedure.  Take your rapin insulin after your procedure.  Continue taking your anticoagulant (blood thinner): Apixaban (Eliquis).  You will need to continue this after your procedure until you are told by your provider that it is safe to stop.    LABS: Labs will be drawn today.  FYI:  For your safety, and to allow [place] to monitor your vital signs accurately during the surgery/procedure we request: If you have artificial nails, gel coating, SNS etc, please have those removed prior to your surgery/procedure. Not having the nail coverings /polish removed may result in cancellation or delay of your surgery/procedure.  You must have a responsible person to drive you home and stay in the waiting area during your procedure. Failure to do so could result in cancellation.  Bring your insurance cards.  *Special Note: Every effort is made to have your procedure done on time. Occasionally there are emergencies that occur at the hospital that may cause delays. Please be patient if a delay does occur.     Your physician  has requested that you have an echocardiogram. Echocardiography is a painless test that uses sound waves to create images of your heart. It provides your doctor with information about the size and shape of your heart and how well your heart's chambers and valves are working.   You may receive an ultrasound  enhancing agent through an IV if needed to better visualize your heart during the echo. This procedure takes approximately one hour.  There are no restrictions for this procedure.  This will take place at [address] [facility] [address], [place] [address]  Please note: We ask at that you not bring children with you during ultrasound (echo/ vascular) testing. Due to room size and safety concerns, children are not allowed in the ultrasound rooms during exams. Our front office staff cannot provide observation of children in our lobby area while testing is being conducted. An adult accompanying a patient to their appointment will only be allowed in the ultrasound room at the discretion of the ultrasound technician under special circumstances. We apologize for any inconvenience.     Follow-Up: At [facility], you and your health needs are our priority.  As part of our continuing mission to provide you with exceptional heart care, we have created designated Provider Care Teams.  These Care Teams include your primary Cardiologist (physician) and Advanced Practice Providers (APPs -  Physician Assistants and Nurse Practitioners) who all work together to provide you with the care you need, when you need it.  We recommend signing up for the patient portal called "MyChart".  Sign up information is provided on this After Visit Summary.  MyChart is used to connect with patients for Virtual Visits (Telemedicine).  Patients are able to view lab/test results, encounter notes, upcoming appointments, etc.  Non-urgent messages can be sent to your provider as well.   To learn more about what you can do with MyChart, go to [email or web address].    Your next appointment:   5-6  week(s)  Provider:   You may see Julien Nordmann, MD or one of the following Advanced Practice Providers on your designated Care Team:   Eula Listen, [place]
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
## 2024-01-18 NOTE — Progress Notes (Signed)
Cardiology Office Note    Date:  01/18/2024   ID:  Jerry Parsons 1951/08/06, MRN [id number]  PCP:  Bosie Clos, MD  Cardiologist:  Julien Nordmann, MD  Electrophysiologist:  None   Chief Complaint: Follow-up  History of Present Illness:   Jerry Parsons is a 73 y.o. male with history of persistent atrial flutter on amiodarone and apixaban status post prior cardioversion, HFpEF, diabetes, CKD stage II-III, HTN, HLD, TIA, DVT/PE in 2011, peripheral neuropathy, chronic venous stasis, obesity, sleep apnea, chronic back pain, spinal stenosis, prior tobacco use, and osteoarthritis who presents for follow-up of volume overload and persistent atrial flutter.  Remote cardiac cath in the 1990s with minimal blockage.  Echo from 2012 showed an EF of 55 to 60%, no regional wall motion normalities, grade 1 diastolic dysfunction, and normal RVSP.  Prior note indicates a history of atrial flutter with cardioversion in 02/2013. He was previously maintained on amiodarone without OAC, though has subsequently been maintained on DOAC since 2021.  Echo in 2016 showed normal LV systolic function, no regional wall motion abnormalities, grade 1 diastolic dysfunction, and normal RV systolic function and PASP.  Echo in 2019 showed an EF of 55 to 60%, mild concentric LVH, normal LV diastolic function parameters, mild biatrial enlargement.    He was admitted to the hospital in 05/2020 after being found asleep in his car.  He was found to be in rate controlled atrial flutter and started on apixaban.  Echo during that admission showed an EF of 60 to 65%, no regional wall motion normalities, mild LVH, indeterminate LV diastolic function parameters, normal RV systolic function and ventricular cavity size, normal RVSP, and moderately dilated left atrium.  Follow-up Zio patch in 06/2020 showed a 100% atrial flutter burden.  He subsequently underwent successful cardioversion in 08/2020.  Following that, EKGs demonstrated  sinus rhythm up until 09/2023 during hospital admission for acute hypoxic respiratory failure in the setting of community-acquired pneumonia.    He was most recently seen in the office in 11/2023 reporting worsening of chronic lower extremity swelling.  He reported a weight increase of 22 pounds over 1 week in 10/2023.  In this setting, he had been transition from furosemide to torsemide by outside office with recommendation to follow-up with cardiology.  He was noted to have some improvement in volume status, though remained volume up.  EKG at that time continued to show atrial flutter with controlled ventricular response.  Amiodarone was increased to 200 mg twice daily.  Torsemide was titrated to 40 mg twice daily for 3 days followed by 20 mg twice daily thereafter.  With this, he had progressive AKI with creatinine trending to 2.18 on 12/13/2023 with recommendation to hold torsemide.  Follow-up labs 12/16/2023 showed improving serum creatinine of 1.59 with recommendation for the patient to take torsemide 20 mg daily as needed.  He comes in accompanied by his wife today and continues to note ongoing fatigue and generalized malaise.  He does feel like his breathing is improved when compared to prior visit.  Lower extremity swelling remains intermittent.  He is currently taking torsemide 20 mg daily given the reported increased swelling on as needed dosing.  Labs obtained last week show stable renal function on a 20 mg daily dosing of torsemide.  He has not missed any doses of apixaban.  He did fall off a ladder, missing the last step since he was last seen.  He landed on his back, though did not  hit his head or suffer LOC.  No hematochezia or melena.  Overall, his symptoms feel similar to what he has previously experienced when in atrial flutter.  Weight at home has overall been stable around 270 pounds.  By our scale, his weight is up 8 pounds compared to his visit last month, though stable when compared to  visit in 01/2023.  He and his wife are interested in pursuing DCCV.   Labs independently reviewed: 12/2023 - A1c 10.3, TC 177, TG 4 2, HDL 34, TSH normal, Hgb 14, PLT 116, potassium 4.2, BUN 30, serum creatinine 1.7, albumin 4.3, AST/ALT normal 05/2022 - LDL 73  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    x2   GERD (gastroesophageal reflux disease)    Headache    h/o as a child   Hypercholesteremia    Hypertension    Kidney failure    acute   Neuromuscular disorder (HCC)    Neuropathy    Pneumonia    time 9 ;last episode 12/2015   Pulmonary embolus (HCC) 2011   Seasonal allergies    Seizures (HCC)    as child    Sleep apnea    BIPAP   Stiff neck    limited turning s/p titanium plate placement   TIA (transient ischemic attack)    Wears dentures    full upper and lower    Past Surgical History:  Procedure Laterality Date   BACK SURGERY     x 8; upper x 3 & lower x 5   CARDIOVERSION  03/14/13, 10/16   2014 - [facility], 2016 - [place]   CARDIOVERSION N/A 09/11/2020   Procedure: CARDIOVERSION;  Surgeon: Antonieta Iba, MD;  Location: [facility] OR[facility];  Service: Cardiovascular;  Laterality: N/A;   CATARACT EXTRACTION W/PHACO Left 10/29/2015   Procedure: CATARACT EXTRACTION PHACO AND INTRAOCULAR LENS PLACEMENT (IOC);  Surgeon: Lockie Mola, MD;  Location: [facility];  Service: Ophthalmology;  Laterality: Left;  DIABETIC - insulin and oral medsSleep apnea - no machine   CATARACT EXTRACTION W/PHACO Right 02/16/2023   Procedure: CATARACT EXTRACTION PHACO AND INTRAOCULAR LENS PLACEMENT (IOC) RIGHT DIABETIC MALYUGIN HEALON 5 VISION BLUE  65.52  03:57.7;  Surgeon: Lockie Mola, MD;   Location: [facility];  Service: Ophthalmology;  Laterality: Right;  Diabetic   CHOLECYSTECTOMY     COLONOSCOPY WITH PROPOFOL N/A 01/16/2018   Procedure: COLONOSCOPY WITH PROPOFOL;  Surgeon: Scot Jun, MD;  Location: [facility];  Service: Endoscopy;  Laterality: N/A;   ESOPHAGOGASTRODUODENOSCOPY (EGD) WITH PROPOFOL N/A 01/16/2018   Procedure: ESOPHAGOGASTRODUODENOSCOPY (EGD) WITH PROPOFOL;  Surgeon: Scot Jun, MD;  Location: [facility] END[facility];  Service: Endoscopy;  Laterality: N/A;   EYE SURGERY     GALLBLADDER SURGERY  2002   JOINT REPLACEMENT Right 2018   KNEE ARTHROSCOPY     left    PARATHYROIDECTOMY N/A 07/25/2020   Procedure: PARATHYROIDECTOMY;  Surgeon: Duanne Guess, MD;  Location: [facility] ORS;  Service: General;  Laterality: N/A;   ROTATOR CUFF REPAIR  2001   left    TEE WITHOUT CARDIOVERSION N/A 04/26/2018   Procedure: TRANSESOPHAGEAL ECHOCARDIOGRAM (TEE);  Surgeon: Yvonne Kendall, MD;  Location: [facility] OR[facility];  Service: Cardiovascular;  Laterality: N/A;   TONSILLECTOMY     TOTAL KNEE ARTHROPLASTY Right 06/20/2017   Procedure: RIGHT TOTAL KNEE ARTHROPLASTY;  Surgeon: Ollen Gross, MD;  Location: [facility];  Service: Orthopedics;  Laterality: Right;    Current Medications: Current Meds  Medication Sig   Continuous Blood Gluc Receiver (FREESTYLE LIBRE 2 READER) DEVI Inject 1 Device into the skin daily. DGX   Continuous Blood Gluc Sensor (FREESTYLE LIBRE 2 SENSOR) MISC 2 Devices by Does not apply route daily.   Docusate Sodium (DSS) 100 MG CAPS Take by mouth.   dutasteride (AVODART) 0.5 MG capsule Take 0.5 mg by mouth daily.   ELIQUIS 5 MG TABS tablet TAKE 1 TABLET TWICE A DAY   FARXIGA 10 MG TABS tablet TAKE 1 TABLET BY MOUTH EVERY DAY   ferrous sulfate 325 (65 FE) MG tablet Take 1 tablet by mouth daily.   fexofenadine (ALLEGRA) 180 MG tablet Take 180 mg by mouth daily.   gabapentin (NEURONTIN) 100 MG capsule Take 100 mg by mouth 2 (two) times daily.    gabapentin (NEURONTIN) 300 MG capsule Take 1 capsule (300 mg total) by mouth at bedtime.   Insulin Glargine (BASAGLAR KWIKPEN) 100 UNIT/ML INJECT 40 UNITS            SUBCUTANEOUSLY TWO TIMES A DAY (Patient taking differently: 44 units two times per day)   naloxone (NARCAN) nasal spray 4 mg/0.1 mL Please use in the event of overdose from narcotic medication   NOVOLOG FLEXPEN 100 UNIT/ML FlexPen INJECT 14 UNITS            SUBCUTANEOUSLY IN THE      MORNING AND AT BEDTIME   Omega-3 Fatty Acids (FISH OIL) 1200 MG CAPS Take 1,200 mg by mouth 2 (two) times daily.    ONETOUCH ULTRA test strip USE WITH METER TWICE DAILY   oxyCODONE (OXYCONTIN) 40 mg 12 hr tablet Take 1 tablet (40 mg total) by mouth every 8 (eight) hours as needed.   pregabalin (LYRICA) 200 MG capsule TAKE 1 CAPSULE 3 TIMES A   DAY   rOPINIRole (REQUIP) 1 MG tablet TAKE 1 TO 2 TABLETS BY MOUTH AT BEDTIME AS NEEDED (Patient taking differently: Take 1 mg by mouth 2 (two) times daily. One tab twice per day)   rOPINIRole (REQUIP) 2 MG tablet TAKE 1 TABLET BY MOUTH TWICE A DAY AS NEEDED (Patient taking differently: Take 2 mg by mouth at bedtime.)   spironolactone (ALDACTONE) 25 MG tablet Take 1 tablet by mouth daily.   tamsulosin (FLOMAX) 0.4 MG CAPS capsule Take 0.4 mg by mouth daily.   torsemide (DEMADEX) 20 MG tablet Take one tablet as needed for a weight gain of 2 lbs in 24 hrs or 5 lbs in a week.   UNABLE TO FIND Take 1 tablet by mouth 2 (two) times daily. Med Name: Copacetic   VITAMIN D PO Take by mouth. 1 tablet once daily in the morning   [DISCONTINUED] amiodarone (PACERONE) 200 MG tablet TAKE 1 TABLET BY MOUTH TWICE A DAY    Allergies:   Carisoprodol   Social History   Socioeconomic History   Marital status: Married    Spouse name: Meriam Sprague    Number of children: 1   Years of education: Not on file   Highest education level: 6th grade  Occupational History   Occupation: retired  Tobacco Use   Smoking status: Former     Current  packs/day: 0.00    Average packs/day: 2.0 packs/day for 20.0 years (40.0 ttl pk-yrs)    Types: Cigarettes    Start date: 12/26/1969    Quit date: 12/26/1989    Years since quitting: 34.0   Smokeless tobacco: Never  Vaping Use   Vaping status: Never Used  Substance and Sexual Activity   Alcohol use: No   Drug use: No   Sexual activity: Not on file  Other Topics Concern   Not on file  Social History Narrative   Lives at home with wife    Social Drivers of Health   Financial Resource Strain: Low Risk  (09/03/2023)   Received from [facility]   Overall Financial Resource Strain (CARDIA)    Difficulty of Paying Living Expenses: Not hard at all  Food Insecurity: Patient Declined (09/03/2023)   Received from [facility]   Hunger Vital Sign    Worried About Running Out of Food in the Last Year: Patient declined    Ran Out of Food in the Last Year: Patient declined  Transportation Needs: No Transportation Needs (10/05/2018)   PRAPARE - Administrator, Civil Service (Medical): No    Lack of Transportation (Non-Medical): No  Physical Activity: Inactive (10/31/2019)   Exercise Vital Sign    Days of Exercise per Week: 0 days    Minutes of Exercise per Session: 0 min  Stress: No Stress Concern Present (10/31/2019)   [employer] of Occupational Health - Occupational Stress Questionnaire    Feeling of Stress : Not at all  Social Connections: Unknown (10/05/2018)   Social Connection and Isolation Panel [NHANES]    Frequency of Communication with Friends and Family: Patient declined    Frequency of Social Gatherings with Friends and Family: Patient declined    Attends Religious Services: Patient declined    Database administrator or Organizations: Patient declined    Attends Banker Meetings: Patient declined    Marital Status: Patient declined     Family History:  The patient's family history includes Heart attack  in his brother.  ROS:   12-point review of systems is negative unless otherwise noted in the HPI.   EKGs/Labs/Other Studies Reviewed:    Studies reviewed were summarized above. The additional studies were reviewed today:  Zio patch 06/2020: Atrial Flutter occurred continuously (100% burden), ranging from 51-139 bpm (avg of 80 bpm).    Isolated VEs were rare (<1.0%), VE Couplets were rare (<1.0%), and no VE Triplets were present.   Patient triggered events were not associated with significant arrhythmia __________  2D echo 06/06/2020: 1. Left ventricular ejection fraction, by estimation, is 60 to 65%. The  left ventricle has normal function. The left ventricle has no regional  wall motion abnormalities. There is mild left ventricular hypertrophy.  Left ventricular diastolic parameters  are indeterminate.   2. Right ventricular systolic function is normal. The right ventricular  size is normal. There is normal pulmonary artery systolic pressure.   3. Left atrial size was moderately dilated.   4. The aortic valve was not well visualized. Aortic valve regurgitation  is not visualized. No aortic stenosis is present.  __________  2D echo 04/24/2018: - Procedure narrative: Transthoracic echocardiography. Image    quality was suboptimal. The study was technically difficult, as a    result of poor acoustic windows and body habitus.  - Left ventricle: The cavity size was normal. There was mild    concentric hypertrophy.  Systolic function was normal. The    estimated ejection fraction was in the range of 55% to 60%. Left    ventricular diastolic function parameters were normal.  - Left atrium: The atrium was mildly dilated.  - Right atrium: The atrium was mildly dilated.  - Pulmonary arteries: Systolic pressure could not be accurately    estimated.   Impressions:   - There was no evidence of a vegetation.  __________  2D echo 09/09/2015: - Procedure narrative: Transthoracic  echocardiography. The study    was technically difficult.  - Left ventricle: The cavity size was normal. Systolic function was    normal. Wall motion was normal; there were no regional wall    motion abnormalities. Doppler parameters are consistent with    abnormal left ventricular relaxation (grade 1 diastolic    dysfunction).  - Left atrium: The atrium was at the upper limits of normal in    size.  - Right ventricle: Systolic function was normal.  - Pulmonary arteries: Systolic pressure was within the normal    range.   Impressions:   - Normal study. No source of TIA or CVA noted.  __________  2D echo 04/03/2011: - Left ventricle: The cavity size was normal. There was mild    concentric hypertrophy. Systolic function was normal. The    estimated ejection fraction was in the range of 55% to 60%. Wall    motion was normal; there were no regional wall motion    abnormalities. Doppler parameters are consistent with abnormal    left ventricular relaxation (grade 1 diastolic dysfunction).  - Right ventricle: Systolic function was normal.  - Pulmonary arteries: Systolic pressure was within the normal range.  Impressions:   - Diastolic dysfunction noted, otherwise a Normal study.    EKG:  EKG is ordered today.  The EKG ordered today demonstrates atrial flutter with variable AV block, 61 bpm, incomplete RBBB, nonspecific ST-T changes, baseline artifact, consistent with prior tracing  Recent Labs: 05/27/2023: ALT 19 10/15/2023: Hemoglobin 11.4; Platelets 103 12/16/2023: BUN 35; Creatinine, Ser 1.59; Potassium 4.5; Sodium 141  Recent Lipid Panel    Component Value Date/Time   CHOL 143 06/08/2022 0945   CHOL 93 04/03/2013 0209   TRIG 292 (H) 06/08/2022 0945   TRIG 163 04/03/2013 0209   HDL 23 (L) 06/08/2022 0945   HDL 25 (L) 04/03/2013 0209   CHOLHDL 6.2 (H) 06/08/2022 0945   CHOLHDL 6.6 09/09/2015 0501   VLDL UNABLE TO CALCULATE IF TRIGLYCERIDE OVER 400 mg/dL 16/09/9603 5409    VLDL 33 04/03/2013 0209   LDLCALC 73 06/08/2022 0945   LDLCALC 35 04/03/2013 0209    PHYSICAL EXAM:    VS:  BP 129/60 (BP Location: Left Arm, Patient Position: Sitting, Cuff Size: Normal)   Pulse 61   Ht 5\' 10"  (1.778 m)   Wt 275 lb (124.7 kg)   SpO2 91%   BMI 39.46 kg/m   BMI: Body mass index is 39.46 kg/m.  Physical Exam Vitals reviewed.  Constitutional:      Appearance: He is well-developed.  HENT:     Head: Normocephalic and atraumatic.  Eyes:     General:        Right eye: No discharge.        Left eye: No discharge.  Neck:     Vascular: No JVD.  Cardiovascular:     Rate and Rhythm: Normal rate. Rhythm irregularly irregular.     Heart sounds: Normal heart sounds, S1 normal and  S2 normal. Heart sounds not distant. No midsystolic click and no opening snap. No murmur heard.    No friction rub.  Pulmonary:     Effort: Pulmonary effort is normal. No respiratory distress.     Breath sounds: Normal breath sounds. No decreased breath sounds, wheezing, rhonchi or rales.  Chest:     Chest wall: No tenderness.  Abdominal:     General: There is no distension.  Musculoskeletal:     Cervical back: Normal range of motion.     Right lower leg: Edema present.     Left lower leg: Edema present.     Comments: 2+ bilateral lower extremity pitting edema to the midshin with chronic hyperpigmentation.  Skin:    General: Skin is warm and dry.     Nails: There is no clubbing.  Neurological:     Mental Status: He is alert and oriented to person, place, and time.  Psychiatric:        Speech: Speech normal.        Behavior: Behavior normal.        Thought Content: Thought content normal.        Judgment: Judgment normal.     Wt Readings from Last 3 Encounters:  01/18/24 275 lb (124.7 kg)  12/07/23 267 lb 12.8 oz (121.5 kg)  12/02/23 270 lb (122.5 kg)     ASSESSMENT & PLAN:   HFpEF: Volume status is improving, though he does appear to be remaining volume up.  However,  diuresis has been limited by AKI in the context of titration of torsemide.  For now, continue torsemide 20 mg daily along with spironolactone 25 mg and Farxiga 10 mg.  Volume status is likely exacerbated by persistent atrial flutter.  Cannot exclude progressive cardiomyopathy in the context of his atrial arrhythmia.  Plan to pursue echo following restoration of sinus rhythm.  Should he be found to have a cardiomyopathy at that time, in sinus rhythm, would need to pursue ischemic testing as well.  Recent labs obtained less than 1 week ago show stable renal function on current pharmacotherapy.    Persistent atrial flutter: He remains in atrial flutter with controlled ventricular response which has been present since at least 09/2023.  Symptoms of generalized fatigue and malaise are consistent with prior episodes of atrial flutter.  Reduce amiodarone to 200 mg daily.  Metoprolol tartrate 100 mg twice daily remains on his list, though his wife indicates he is not currently taking this.  For now, defer resumption in an effort to minimize risk of bradycardia following DCCV.  He has been adherent to apixaban 5 mg twice daily and has not missed any doses, which will be continued.  No symptoms concerning for bleeding.  Schedule DCCV.  Recent amiodarone labs normal.  CKD stage II-III: Stable on check last seen 1 week ago.  HTN: Blood pressure is well-controlled in the office.  Continue pharmacotherapy as outlined above.  OSA: Compliant with CPAP.   Informed Consent   Shared Decision Making/Informed Consent{  The risks (stroke, cardiac arrhythmias rarely resulting in the need for a temporary or permanent pacemaker, skin irritation or burns and complications associated with conscious sedation including aspiration, arrhythmia, respiratory failure and death), benefits (restoration of normal sinus rhythm) and alternatives of a direct current cardioversion were explained in detail to Mr. Motta and he agrees to  proceed.          Disposition: F/u with Dr. Mariah Milling or an APP after DCCV and echo.  Medication Adjustments/Labs and Tests Ordered: Current medicines are reviewed at length with the patient today.  Concerns regarding medicines are outlined above. Medication changes, Labs and Tests ordered today are summarized above and listed in the Patient Instructions accessible in Encounters.   Signed, Eula Listen, PA-C 01/18/2024 2:44 PM     [facility] [place] [address] [place], [place] [address] [phone]

## 2024-01-19 LAB — BASIC METABOLIC PANEL
BUN/Creatinine Ratio: 19 (ref 10–24)
BUN: 31 mg/dL — ABNORMAL HIGH (ref 8–27)
CO2: 28 mmol/L (ref 20–29)
Calcium: 9.4 mg/dL (ref 8.6–10.2)
Chloride: 101 mmol/L (ref 96–106)
Creatinine, Ser: 1.61 mg/dL — ABNORMAL HIGH (ref 0.76–1.27)
Glucose: 130 mg/dL — ABNORMAL HIGH (ref 70–99)
Potassium: 4.6 mmol/L (ref 3.5–5.2)
Sodium: 144 mmol/L (ref 134–144)
eGFR: 45 mL/min/{1.73_m2} — ABNORMAL LOW (ref >59)

## 2024-01-19 LAB — CBC
Hematocrit: 45.1 % (ref 37.5–51.0)
Hemoglobin: 13.9 g/dL (ref 13.0–17.7)
MCH: 25.6 pg — ABNORMAL LOW (ref 26.6–33.0)
MCHC: 30.8 g/dL — ABNORMAL LOW (ref 31.5–35.7)
MCV: 83 fL (ref 79–97)
Platelets: 123 10*3/uL — ABNORMAL LOW (ref 150–450)
RBC: 5.42 x10E6/uL (ref 4.14–5.80)
RDW: 16.2 % — ABNORMAL HIGH (ref 11.6–15.4)
WBC: 6.7 10*3/uL (ref 3.4–10.8)

## 2024-01-24 MED ORDER — SODIUM CHLORIDE 0.9 % IV SOLN: INTRAVENOUS | Status: DC

## 2024-01-25 ENCOUNTER — Ambulatory Visit: Payer: Self-pay | Admitting: Anesthesiology

## 2024-01-25 ENCOUNTER — Encounter
Admission: RE | Disposition: A | Discharge status: Home / Self Care | Payer: Self-pay | Source: Home / Self Care | Attending: Cardiovascular Disease

## 2024-01-25 ENCOUNTER — Encounter: Payer: Self-pay | Admitting: Cardiovascular Disease

## 2024-01-25 ENCOUNTER — Other Ambulatory Visit: Payer: Self-pay

## 2024-01-25 ENCOUNTER — Ambulatory Visit
Admission: RE | Admit: 2024-01-25 | Discharge: 2024-01-25 | Disposition: A | Discharge status: Home / Self Care | Payer: Medicare Other | Attending: Cardiovascular Disease | Admitting: Cardiovascular Disease

## 2024-01-25 DIAGNOSIS — E669 Obesity, unspecified: Secondary | ICD-10-CM | POA: Insufficient documentation

## 2024-01-25 DIAGNOSIS — I483 Typical atrial flutter: Secondary | ICD-10-CM

## 2024-01-25 DIAGNOSIS — I5032 Chronic diastolic (congestive) heart failure: Secondary | ICD-10-CM | POA: Insufficient documentation

## 2024-01-25 DIAGNOSIS — I130 Hypertensive heart and chronic kidney disease with heart failure and stage 1 through stage 4 chronic kidney disease, or unspecified chronic kidney disease: Secondary | ICD-10-CM | POA: Diagnosis not present

## 2024-01-25 DIAGNOSIS — I4891 Unspecified atrial fibrillation: Secondary | ICD-10-CM | POA: Diagnosis not present

## 2024-01-25 DIAGNOSIS — N183 Chronic kidney disease, stage 3 unspecified: Secondary | ICD-10-CM | POA: Insufficient documentation

## 2024-01-25 DIAGNOSIS — E785 Hyperlipidemia, unspecified: Secondary | ICD-10-CM | POA: Diagnosis not present

## 2024-01-25 DIAGNOSIS — E1142 Type 2 diabetes mellitus with diabetic polyneuropathy: Secondary | ICD-10-CM | POA: Diagnosis not present

## 2024-01-25 DIAGNOSIS — I4892 Unspecified atrial flutter: Secondary | ICD-10-CM | POA: Insufficient documentation

## 2024-01-25 DIAGNOSIS — Z87891 Personal history of nicotine dependence: Secondary | ICD-10-CM | POA: Diagnosis not present

## 2024-01-25 DIAGNOSIS — G8929 Other chronic pain: Secondary | ICD-10-CM | POA: Diagnosis not present

## 2024-01-25 DIAGNOSIS — G4733 Obstructive sleep apnea (adult) (pediatric): Secondary | ICD-10-CM | POA: Insufficient documentation

## 2024-01-25 DIAGNOSIS — Z7984 Long term (current) use of oral hypoglycemic drugs: Secondary | ICD-10-CM | POA: Diagnosis not present

## 2024-01-25 DIAGNOSIS — E1122 Type 2 diabetes mellitus with diabetic chronic kidney disease: Secondary | ICD-10-CM | POA: Insufficient documentation

## 2024-01-25 DIAGNOSIS — Z86718 Personal history of other venous thrombosis and embolism: Secondary | ICD-10-CM | POA: Insufficient documentation

## 2024-01-25 DIAGNOSIS — Z794 Long term (current) use of insulin: Secondary | ICD-10-CM | POA: Insufficient documentation

## 2024-01-25 DIAGNOSIS — Z6841 Body Mass Index (BMI) 40.0 and over, adult: Secondary | ICD-10-CM | POA: Diagnosis not present

## 2024-01-25 DIAGNOSIS — Z8673 Personal history of transient ischemic attack (TIA), and cerebral infarction without residual deficits: Secondary | ICD-10-CM | POA: Insufficient documentation

## 2024-01-25 HISTORY — PX: CARDIOVERSION: SHX1299

## 2024-01-25 LAB — GLUCOSE, CAPILLARY: Glucose-Capillary: 184 mg/dL — ABNORMAL HIGH (ref 70–99)

## 2024-01-25 SURGERY — CARDIOVERSION
Anesthesia: General

## 2024-01-25 MED ORDER — PHENYLEPHRINE 80 MCG/ML (10ML) SYRINGE FOR IV PUSH (FOR BLOOD PRESSURE SUPPORT)
PREFILLED_SYRINGE | INTRAVENOUS | Status: AC
  Filled 2024-01-25: qty 40

## 2024-01-25 MED ORDER — EPHEDRINE 5 MG/ML INJ
INTRAVENOUS | Status: AC
  Filled 2024-01-25: qty 20

## 2024-01-25 MED ORDER — ATROPINE SULFATE 0.4 MG/ML IV SOLN
INTRAVENOUS | Status: AC
  Filled 2024-01-25: qty 2

## 2024-01-25 MED ORDER — LIDOCAINE HCL (PF) 2 % IJ SOLN
INTRAMUSCULAR | Status: AC
  Filled 2024-01-25: qty 25

## 2024-01-25 MED ORDER — PROPOFOL 500 MG/50ML IV EMUL
INTRAVENOUS | Status: DC | PRN
  Administered 2024-01-25: 70 mg via INTRAVENOUS

## 2024-01-25 MED ORDER — EPINEPHRINE PF 1 MG/ML IJ SOLN
INTRAMUSCULAR | Status: AC
  Filled 2024-01-25: qty 2

## 2024-01-25 MED ORDER — PROPOFOL 1000 MG/100ML IV EMUL
INTRAVENOUS | Status: AC
  Filled 2024-01-25: qty 200

## 2024-01-25 NOTE — CV Procedure (Signed)
Cardioversion procedure note For atrial flutter typical  Procedure Details:  Consent: Risks of procedure as well as the alternatives and risks of each were explained to the (patient/caregiver).  Consent for procedure obtained.  Time Out: Verified patient identification, verified procedure, site/side was marked, verified correct patient position, special equipment/implants available, medications/allergies/relevent history reviewed, required imaging and test results available.  Performed  Patient placed on cardiac monitor, pulse oximetry, supplemental oxygen as necessary.   Sedation given: propofol IV, Dr. Randa Ngo Pacer pads placed anterior and posterior chest.   Cardioverted 1 time(s).   Cardioverted at  150 J. Synchronized biphasic Converted to NSR   Evaluation: Findings: Post procedure EKG shows: NSR Complications: None Patient did tolerate procedure well.  Time Spent Directly with the Patient:  45 minutes   Jerry Parsons, M.D., Ph.D.

## 2024-01-25 NOTE — Anesthesia Postprocedure Evaluation (Signed)
Anesthesia Post Note  Patient: Jerry Parsons  Procedure(s) Performed: CARDIOVERSION  Patient location during evaluation: Specials Recovery Anesthesia Type: General Level of consciousness: awake and alert Pain management: pain level controlled Vital Signs Assessment: post-procedure vital signs reviewed and stable Respiratory status: spontaneous breathing, nonlabored ventilation, respiratory function stable and patient connected to nasal cannula oxygen Cardiovascular status: blood pressure returned to baseline and stable Postop Assessment: no apparent nausea or vomiting Anesthetic complications: no   There were no known notable events for this encounter.   Last Vitals:  Vitals:   01/25/24 0751 01/25/24 0800  BP: (!) 100/47 102/60  Pulse: (!) 51 (!) 58  Resp: 13 16  Temp:    SpO2: 94% 91%    Last Pain:  Vitals:   01/25/24 0800  TempSrc:   PainSc: 0-No pain                 Cleda Mccreedy Madelynne Lasker

## 2024-01-25 NOTE — Transfer of Care (Signed)
Immediate Anesthesia Transfer of Care Note  Patient: Jerry Parsons  Procedure(s) Performed: CARDIOVERSION  Patient Location: Cath Lab  Anesthesia Type:General  Level of Consciousness: awake  Airway & Oxygen Therapy: Patient Spontanous Breathing and Patient connected to nasal cannula oxygen  Post-op Assessment: Report given to RN and Post -op Vital signs reviewed and stable  Post vital signs: Reviewed and stable  Last Vitals:  Vitals Value Taken Time  BP    Temp    Pulse    Resp    SpO2      Last Pain:  Vitals:   01/25/24 0703  TempSrc: Oral  PainSc: 8       Patients Stated Pain Goal: 0 (01/25/24 0703)  Complications: There were no known notable events for this encounter.

## 2024-01-25 NOTE — Anesthesia Preprocedure Evaluation (Signed)
Anesthesia Evaluation  Patient identified by MRN, date of birth, ID band Patient awake    Reviewed: Allergy & Precautions, NPO status , Patient's Chart, lab work & pertinent test results  History of Anesthesia Complications Negative for: history of anesthetic complications  Airway Mallampati: III  TM Distance: <3 FB Neck ROM: full    Dental  (+) Chipped, Upper Dentures   Pulmonary shortness of breath and with exertion, sleep apnea , former smoker   Pulmonary exam normal        Cardiovascular Exercise Tolerance: Good hypertension, +CHF  + dysrhythmias Atrial Fibrillation      Neuro/Psych  Headaches, Seizures -,  TIA Neuromuscular disease CVA, Residual Symptoms  negative psych ROS   GI/Hepatic Neg liver ROS,GERD  Controlled,,  Endo/Other  negative endocrine ROSdiabetes    Renal/GU Renal disease  negative genitourinary   Musculoskeletal   Abdominal   Peds  Hematology negative hematology ROS (+)   Anesthesia Other Findings Past Medical History: No date: Arrhythmia     Comment:  tachycardia, A-Fib No date: Arthritis 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 No date: BPH (benign prostatic hyperplasia) No date: Diabetes mellitus 02/2010: DVT (deep venous thrombosis) (HCC)     Comment:  leg thrombus ; dislodged into emboli and caused PE No date: Dyspnea No date: Dysrhythmia No date: 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     Comment:  x2 No date: GERD (gastroesophageal reflux disease) No date: Headache     Comment:  h/o as a child No date: Hypercholesteremia No date: Hypertension No date: Kidney failure     Comment:  acute No date: Neuromuscular disorder (HCC) No date: Neuropathy No date: Pneumonia     Comment:  time 54 ;last episode 12/2015 2011: Pulmonary embolus (HCC) No date: Seasonal allergies No date: Seizures (HCC)     Comment:  as child  No date: Sleep apnea     Comment:  BIPAP No date: Stiff neck      Comment:  limited turning s/p titanium plate placement No date: TIA (transient ischemic attack) No date: Wears dentures     Comment:  full upper and lower  Past Surgical History: No date: BACK SURGERY     Comment:  x 8; upper x 3 & lower x 5 03/14/13, 10/16: CARDIOVERSION     Comment:  2014 - [facility], 2016 - [facility] 09/11/2020: CARDIOVERSION; N/A     Comment:  Procedure: CARDIOVERSION;  Surgeon: Antonieta Iba,               MD;  Location: [facility];  Service: Cardiovascular;                Laterality: N/A; 10/29/2015: CATARACT EXTRACTION W/PHACO; Left     Comment:  Procedure: CATARACT EXTRACTION PHACO AND INTRAOCULAR               LENS PLACEMENT (IOC);  Surgeon: Lockie Mola, MD;               Location: [facility];  Service: Ophthalmology;                Laterality: Left;  DIABETIC - insulin and oral medsSleep               apnea - no machine 02/16/2023: CATARACT EXTRACTION W/PHACO; Right     Comment:  Procedure: CATARACT EXTRACTION PHACO AND INTRAOCULAR               LENS PLACEMENT (IOC) RIGHT DIABETIC MALYUGIN HEALON 5  VISION BLUE  65.52  03:57.7;  Surgeon: Lockie Mola, MD;  Location: [facility];  Service:               Ophthalmology;  Laterality: Right;  Diabetic No date: CHOLECYSTECTOMY 01/16/2018: COLONOSCOPY WITH PROPOFOL; N/A     Comment:  Procedure: COLONOSCOPY WITH PROPOFOL;  Surgeon: Scot Jun, MD;  Location: [facility] END[facility]COPY;  Service:               Endoscopy;  Laterality: N/A; 01/16/2018: ESOPHAGOGASTRODUODENOSCOPY (EGD) WITH PROPOFOL; N/A     Comment:  Procedure: ESOPHAGOGASTRODUODENOSCOPY (EGD) WITH               PROPOFOL;  Surgeon: Scot Jun, MD;  Location:               [facility] ENDOSCOPY;  Service: Endoscopy;  Laterality: N/A; No date: EYE SURGERY 2002: GALLBLADDER SURGERY 2018: JOINT REPLACEMENT; Right No date: KNEE ARTHROSCOPY     Comment:  left  07/25/2020: PARATHYROIDECTOMY; N/A      Comment:  Procedure: PARATHYROIDECTOMY;  Surgeon: Duanne Guess, MD;  Location: [facility] ORS;  Service: General;                Laterality: N/A; 2001: ROTATOR CUFF REPAIR     Comment:  left  04/26/2018: TEE WITHOUT CARDIOVERSION; N/A     Comment:  Procedure: TRANSESOPHAGEAL ECHOCARDIOGRAM (TEE);                Surgeon: Yvonne Kendall, MD;  Location: [facility] ORS;                Service: Cardiovascular;  Laterality: N/A; No date: TONSILLECTOMY 06/20/2017: TOTAL KNEE ARTHROPLASTY; Right     Comment:  Procedure: RIGHT TOTAL KNEE ARTHROPLASTY;  Surgeon:               Ollen Gross, MD;  Location: [facility]S;  Service:               Orthopedics;  Laterality: Right;  BMI    Body Mass Index: 40.15 kg/m      Reproductive/Obstetrics negative OB ROS                             Anesthesia Physical Anesthesia Plan  ASA: 3  Anesthesia Plan: General   Post-op Pain Management:    Induction: Intravenous  PONV Risk Score and Plan: Propofol infusion and TIVA  Airway Management Planned: Natural Airway and Nasal Cannula  Additional Equipment:   Intra-op Plan:   Post-operative Plan:   Informed Consent: I have reviewed the patients History and Physical, chart, labs and discussed the procedure including the risks, benefits and alternatives for the proposed anesthesia with the patient or authorized representative who has indicated his/her understanding and acceptance.     Dental Advisory Given  Plan Discussed with: Anesthesiologist, CRNA and Surgeon  Anesthesia Plan Comments: (Patient consented for risks of anesthesia including but not limited to:  - adverse reactions to medications - risk of airway placement if required - damage to eyes, teeth, lips or other oral mucosa - nerve damage due to positioning  - sore throat or hoarseness - Damage to heart, brain, nerves, lungs, other parts of body or  loss of life  Patient voiced understanding and assent.)        Anesthesia Quick Evaluation

## 2024-01-26 ENCOUNTER — Encounter: Payer: Self-pay | Admitting: Cardiovascular Disease

## 2024-01-27 NOTE — H&P (Signed)
 H&P Addendum, pre-cardioversion  Patient was seen and evaluated prior to -cardioversion procedure Symptoms, prior testing details again confirmed with the patient Patient examined, no significant change from prior exam Lab work reviewed in detail personally by myself Patient understands risk and benefit of the procedure,  The risks (stroke, cardiac arrhythmias rarely resulting in the need for a temporary or permanent pacemaker, skin irritation or burns and complications associated with conscious sedation including aspiration, arrhythmia, respiratory failure and death), benefits (restoration of normal sinus rhythm) and alternatives of a direct current cardioversion were explained in detail Patient willing to proceed.  Signed, Dossie Arbour, MD, Ph.D [facility]

## 2024-01-27 NOTE — Interval H&P Note (Signed)
History and Physical Interval Note:  01/27/2024 3:41 PM  Jerry Parsons  has presented today for surgery, with the diagnosis of Cardioversion   Afib.  The various methods of treatment have been discussed with the patient and family. After consideration of risks, benefits and other options for treatment, the patient has consented to  Procedure(s): CARDIOVERSION (N/A) as a surgical intervention.  The patient's history has been reviewed, patient examined, no change in status, stable for surgery.  I have reviewed the patient's chart and labs.  Questions were answered to the patient's satisfaction.     Julien Nordmann

## 2024-02-02 ENCOUNTER — Ambulatory Visit: Payer: Medicare Other | Attending: Physician Assistant

## 2024-02-02 ENCOUNTER — Other Ambulatory Visit: Payer: Self-pay | Admitting: Podiatry

## 2024-02-02 DIAGNOSIS — I480 Paroxysmal atrial fibrillation: Secondary | ICD-10-CM | POA: Diagnosis present

## 2024-02-02 LAB — ECHOCARDIOGRAM COMPLETE
AR max vel: 2.07 cm2
AV Area VTI: 2.2 cm2
AV Area mean vel: 2.02 cm2
AV Mean grad: 11 mm[Hg]
AV Peak grad: 21.3 mm[Hg]
Ao pk vel: 2.31 m/s
Area-P 1/2: 4.06 cm2
S' Lateral: 3.6 cm

## 2024-02-20 ENCOUNTER — Encounter: Payer: Self-pay | Admitting: Podiatry

## 2024-02-20 ENCOUNTER — Ambulatory Visit (INDEPENDENT_AMBULATORY_CARE_PROVIDER_SITE_OTHER): Payer: Medicare Other | Admitting: Podiatry

## 2024-02-20 DIAGNOSIS — M79674 Pain in right toe(s): Secondary | ICD-10-CM | POA: Diagnosis not present

## 2024-02-20 DIAGNOSIS — M79675 Pain in left toe(s): Secondary | ICD-10-CM | POA: Diagnosis not present

## 2024-02-20 DIAGNOSIS — E1142 Type 2 diabetes mellitus with diabetic polyneuropathy: Secondary | ICD-10-CM

## 2024-02-20 DIAGNOSIS — B351 Tinea unguium: Secondary | ICD-10-CM | POA: Diagnosis not present

## 2024-02-20 NOTE — Progress Notes (Signed)
This patient returns to my office for at risk foot care.  This patient requires this care by a professional since this patient will be at risk due to having  diabetes.  This patient is unable to cut nails himself since the patient cannot reach his nails.These nails are painful walking and wearing shoes.  This patient presents for at risk foot care today.  General Appearance  Alert, conversant and in no acute stress.  Vascular  Dorsalis pedis and posterior tibial  pulses are palpable  bilaterally.  Capillary return is within normal limits  bilaterally. Temperature is within normal limits  bilaterally.  Neurologic  Senn-Weinstein monofilament wire test within normal limits  bilaterally. Muscle power within normal limits bilaterally.  Nails Thick disfigured discolored nails with subungual debris  from hallux to fifth toes bilaterally. No evidence of bacterial infection or drainage bilaterally.  Orthopedic  No limitations of motion  feet .  No crepitus or effusions noted.  No bony pathology or digital deformities noted.  Skin  normotropic skin with no porokeratosis noted bilaterally.  No signs of infections or ulcers noted.     Onychomycosis  Pain in right toes  Pain in left toes  Consent was obtained for treatment procedures.   Mechanical debridement of nails 1-5  bilaterally performed with a nail nipper.  Filed with dremel without incident.    Return office visit   3 months                   Told patient to return for periodic foot care and evaluation due to potential at risk complications.   Nicholi Ghuman DPM   

## 2024-02-23 ENCOUNTER — Other Ambulatory Visit: Payer: Self-pay | Admitting: Urology

## 2024-02-23 DIAGNOSIS — N5082 Scrotal pain: Secondary | ICD-10-CM

## 2024-02-23 DIAGNOSIS — N481 Balanitis: Secondary | ICD-10-CM

## 2024-02-27 ENCOUNTER — Telehealth: Payer: Self-pay | Admitting: Cardiovascular Disease

## 2024-02-27 NOTE — Telephone Encounter (Signed)
 Returned call to pt's wife - it would appear no one called from [facility] and it was a case of mistaken ID as her husband's brother is in the hospital today

## 2024-02-27 NOTE — Telephone Encounter (Signed)
 Pt's wife states that she is returning a call regarding pt having labs done. Please advise
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
## 2024-03-14 ENCOUNTER — Ambulatory Visit: Payer: Medicare Other | Admitting: Physician Assistant

## 2024-03-20 ENCOUNTER — Other Ambulatory Visit: Payer: Self-pay | Admitting: Podiatry

## 2024-04-11 NOTE — Progress Notes (Unsigned)
 Cardiology Office Note    Date:  04/12/2024   ID:  Jerry Parsons, DOB [birth year], MRN [id number]  PCP:  Bosie Clos, MD  Cardiologist:  Julien Nordmann, MD  Electrophysiologist:  None   Chief Complaint: Follow up  History of Present Illness:   Jerry Parsons is a 73 y.o. male with history of persistent atrial flutter on amiodarone and apixaban status post prior cardioversion, HFpEF, diabetes, CKD stage II-III, HTN, HLD, TIA, DVT/PE in 2011, peripheral neuropathy, chronic venous stasis, obesity, sleep apnea, chronic back pain, spinal stenosis, prior tobacco use, and osteoarthritis who presents for follow-up of volume overload and persistent atrial flutter.   Remote cardiac cath in the 1990s with minimal blockage.  Echo from 2012 showed an EF of 55 to 60%, no regional wall motion normalities, grade 1 diastolic dysfunction, and normal RVSP.  Prior note indicates a history of atrial flutter with cardioversion in 02/2013. He was previously maintained on amiodarone without OAC, though has subsequently been maintained on DOAC since 2021.  Echo in 2016 showed normal LV systolic function, no regional wall motion abnormalities, grade 1 diastolic dysfunction, and normal RV systolic function and PASP.  Echo in 2019 showed an EF of 55 to 60%, mild concentric LVH, normal LV diastolic function parameters, mild biatrial enlargement.     He was admitted to the hospital in 05/2020 after being found asleep in his car.  He was found to be in rate controlled atrial flutter and started on apixaban.  Echo during that admission showed an EF of 60 to 65%, no regional wall motion normalities, mild LVH, indeterminate LV diastolic function parameters, normal RV systolic function and ventricular cavity size, normal RVSP, and moderately dilated left atrium.  Follow-up Zio patch in 06/2020 showed a 100% atrial flutter burden.  He subsequently underwent successful cardioversion in 08/2020.  Following that, EKGs demonstrated  sinus rhythm up until 09/2023 during hospital admission for acute hypoxic respiratory failure in the setting of community-acquired pneumonia.     He was seen in the office in 11/2023 reporting worsening of chronic lower extremity swelling.  He reported a weight increase of 22 pounds over 1 week in 10/2023.  In this setting, he had been transitioned from furosemide to torsemide by outside office with recommendation to follow-up with cardiology.  He was noted to have some improvement in volume status, though remained volume up.  EKG at that time continued to show atrial flutter with controlled ventricular response.  Amiodarone was increased to 200 mg twice daily.  Torsemide was titrated to 40 mg twice daily for 3 days followed by 20 mg twice daily thereafter.  With this, he had progressive AKI with creatinine trending to 2.18 on 12/13/2023 with recommendation to hold torsemide.  Follow-up labs 12/16/2023 showed improving serum creatinine of 1.59 with recommendation for the patient to take torsemide 20 mg daily as needed.  He was most recently seen in the office on 01/18/2024 and continued to note fatigue and generalized malaise.  He did feel like his breathing was improved when compared to prior visit.  Lower extremity swelling remained intermittent.  He was taking torsemide 20 mg daily as he reported increased lower extremity swelling on as needed dosing.  Labs obtained in the week prior showed stable renal function on 20 mg daily dosing.  He reported a stable weight by his home scale around 270 pounds.  By our scale, his weight was up 8 pounds when compared to his visit in 11/2023.  Amiodarone was reduced to 200 mg daily.  Lopressor 100 mg twice daily remained on his list, though his wife indicated he was not taking that medication.  Reinitiation was deferred in an effort to minimize risk of bradycardia following planned DCCV.  He underwent successful DCCV on 01/25/2024.  Echo obtained on 02/02/2024, following  restoration of sinus rhythm, showed an EF of 60 to 65%, no regional wall motion abnormalities, normal LV diastolic function parameters, normal RV systolic function and ventricular cavity size, mildly dilated left atrium, mild mitral regurgitation, aortic valve sclerosis without evidence of stenosis, and an estimated right atrial pressure of 3 mmHg.  He followed up with nephrology on 02/23/2024 with recommendation to change spironolactone to finerenone 20 mg daily.  Was recommended he continue torsemide.  He followed up with neurology on 03/13/2024 with recommendation to start nortriptyline and continue gabapentin and pregabalin.  He comes in accompanied by his daughter today and is not feeling well.  He reports 9 out of 10 substernal chest pain with associated dyspnea and dizziness.  Chest pain has been waxing and waning for the past 2 to 3 months, following DCCV.  Chest pain never fully resolves.  He describes the chest pain as "someone sitting on my chest."  Chest pain is not reproducible to palpation or worse with positional movement.  He is uncertain if pain is exacerbated by exertion as he has been limited in his mobility secondary to underlying lower extremity/back discomfort.  He has also noted some intermittent tachypalpitations.  He continues to note lower extremity swelling and abdominal distention.  No presyncope or syncope.  He has had 1 mechanical fall, falling off the second step of a ladder since he was last seen.  He did not hit his head or suffer LOC.  No symptoms of hematochezia or melena.  Weight is down 6 pounds today when compared to his visit in 12/2023.   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    x2   GERD (gastroesophageal reflux disease)    Headache    h/o as a child   Hypercholesteremia    Hypertension    Kidney failure    acute   Neuromuscular disorder (HCC)    Neuropathy    Pneumonia    time 9 ;last episode 12/2015   Pulmonary embolus (HCC) 2011   Seasonal allergies    Seizures (HCC)    as child    Sleep apnea    BIPAP   Stiff neck    limited turning s/p titanium plate placement   TIA (transient ischemic attack)    Wears dentures    full upper and lower    Past Surgical History:  Procedure Laterality Date   BACK SURGERY     x 8; upper x 3 & lower x 5   CARDIOVERSION  03/14/13, 10/16   2014 - [facility], 2016 - [facility]  CARDIOVERSION N/A 09/11/2020   Procedure: CARDIOVERSION;  Surgeon: Antonieta Iba, MD;  Location: [facility] OR[facility];  Service: Cardiovascular;  Laterality: N/A;   CARDIOVERSION N/A 01/25/2024   Procedure: CARDIOVERSION;  Surgeon: Antonieta Iba, MD;  Location: [facility];  Service: Cardiovascular;  Laterality: N/A;   CATARACT EXTRACTION W/PHACO Left 10/29/2015   Procedure: CATARACT EXTRACTION PHACO AND INTRAOCULAR LENS PLACEMENT (IOC);  Surgeon: Lockie Mola, MD;  Location: [facility];  Service: Ophthalmology;  Laterality: Left;  DIABETIC - insulin and oral medsSleep apnea - no machine   CATARACT EXTRACTION W/PHACO Right 02/16/2023   Procedure: CATARACT EXTRACTION PHACO AND INTRAOCULAR LENS PLACEMENT (IOC) RIGHT DIABETIC MALYUGIN HEALON 5 VISION BLUE  65.52  03:57.7;  Surgeon: Lockie Mola, MD;  Location: [facility];  Service: Ophthalmology;  Laterality: Right;  Diabetic   CHOLECYSTECTOMY     COLONOSCOPY WITH PROPOFOL N/A 01/16/2018   Procedure: COLONOSCOPY WITH PROPOFOL;  Surgeon: Scot Jun,  MD;  Location: [facility];  Service: Endoscopy;  Laterality: N/A;   ESOPHAGOGASTRODUODENOSCOPY (EGD) WITH PROPOFOL N/A 01/16/2018   Procedure: ESOPHAGOGASTRODUODENOSCOPY (EGD) WITH PROPOFOL;  Surgeon: Scot Jun, MD;  Location: [facility] END[facility];  Service: Endoscopy;  Laterality: N/A;   EYE SURGERY     GALLBLADDER SURGERY  2002   JOINT REPLACEMENT Right 2018   KNEE ARTHROSCOPY     left    PARATHYROIDECTOMY N/A 07/25/2020   Procedure: PARATHYROIDECTOMY;  Surgeon: Duanne Guess, MD;  Location: [facility] ORS;  Service: General;  Laterality: N/A;   ROTATOR CUFF REPAIR  2001   left    TEE WITHOUT CARDIOVERSION N/A 04/26/2018   Procedure: TRANSESOPHAGEAL ECHOCARDIOGRAM (TEE);  Surgeon: Yvonne Kendall, MD;  Location: [facility] OR[facility];  Service: Cardiovascular;  Laterality: N/A;   TONSILLECTOMY     TOTAL KNEE ARTHROPLASTY Right 06/20/2017   Procedure: RIGHT TOTAL KNEE ARTHROPLASTY;  Surgeon: Ollen Gross, MD;  Location: [facility];  Service: Orthopedics;  Laterality: Right;    Current Medications: Current Meds  Medication Sig   amiodarone (PACERONE) 200 MG tablet Take 1 tablet (200 mg total) by mouth daily. (Patient taking differently: Take 200 mg by mouth 2 (two) times daily.)   cholecalciferol (VITAMIN D3) 25 MCG (1000 UNIT) tablet Take 1,000 Units by mouth 2 (two) times daily.   Continuous Blood Gluc Receiver (FREESTYLE LIBRE 2 READER) DEVI Inject 1 Device into the skin daily. DGX   Continuous Blood Gluc Sensor (FREESTYLE LIBRE 2 SENSOR) MISC 2 Devices by Does not apply route daily.   cyclobenzaprine (FLEXERIL) 10 MG tablet Take 10 mg by mouth 3 (three) times daily.   docusate sodium (COLACE) 100 MG capsule Take 100 mg by mouth 2 (two) times daily.   dutasteride (AVODART) 0.5 MG capsule Take 0.5 mg by mouth daily.   ELIQUIS 5 MG TABS tablet TAKE 1 TABLET TWICE A DAY   FARXIGA 10 MG TABS tablet TAKE 1 TABLET BY MOUTH EVERY DAY   fexofenadine (ALLEGRA) 180 MG tablet Take 180 mg by mouth daily.    gabapentin (NEURONTIN) 100 MG capsule Take 100 mg by mouth 2 (two) times daily. 100 mg in the morning, 100 mg 3p   gabapentin (NEURONTIN) 300 MG capsule Take 1 capsule (300 mg total) by mouth at bedtime.   Insulin Glargine (BASAGLAR KWIKPEN) 100 UNIT/ML INJECT 40 UNITS            SUBCUTANEOUSLY TWO TIMES A DAY (Patient taking differently: Inject 44 Units into the skin 2 (two) times daily.)   magnesium oxide (MAG-OX) 400 (  240 Mg) MG tablet Take 1 tablet by mouth 2 (two) times daily.   metoprolol tartrate (LOPRESSOR) 50 MG tablet Take by mouth.   naloxone (NARCAN) nasal spray 4 mg/0.1 mL Please use in the event of overdose from narcotic medication   nortriptyline (PAMELOR) 10 MG capsule TAKE 1 CAP BY MOUTH NIGHTLY X 1 WEEK, THEN INCREASE TO 2 CAPS NIGHTLY X 1 WEEK, THEN INCREASE TO 3 CAPS NIGHTLY   NOVOLOG FLEXPEN 100 UNIT/ML FlexPen INJECT 14 UNITS            SUBCUTANEOUSLY IN THE      MORNING AND AT BEDTIME   Omega-3 Fatty Acids (FISH OIL) 1200 MG CAPS Take 2,400 mg by mouth 2 (two) times daily.   ONETOUCH ULTRA test strip USE WITH METER TWICE DAILY   oxyCODONE (OXYCONTIN) 40 mg 12 hr tablet Take 1 tablet (40 mg total) by mouth every 8 (eight) hours as needed.   pantoprazole (PROTONIX) 20 MG tablet Take by mouth.   polyethylene glycol (MIRALAX / GLYCOLAX) 17 g packet Take 17 g by mouth daily.   pregabalin (LYRICA) 200 MG capsule TAKE 1 CAPSULE 3 TIMES A   DAY   rOPINIRole (REQUIP) 1 MG tablet TAKE 1 TO 2 TABLETS BY MOUTH AT BEDTIME AS NEEDED (Patient taking differently: Take 1 mg by mouth 2 (two) times daily. 1 mg in the morning, 1 mg at 3p)   rOPINIRole (REQUIP) 2 MG tablet TAKE 1 TABLET BY MOUTH TWICE A DAY AS NEEDED (Patient taking differently: Take 2 mg by mouth at bedtime.)   sitaGLIPtin (JANUVIA) 100 MG tablet Take by mouth.   tamsulosin (FLOMAX) 0.4 MG CAPS capsule Take 0.4 mg by mouth daily.   telmisartan (MICARDIS) 40 MG tablet Take by mouth.   torsemide (DEMADEX) 20 MG tablet Take one  tablet as needed for a weight gain of 2 lbs in 24 hrs or 5 lbs in a week. (Patient taking differently: Take 20 mg by mouth daily.)    Allergies:   Carisoprodol   Social History   Socioeconomic History   Marital status: Married    Spouse name: Alvy Baar    Number of children: 1   Years of education: Not on file   Highest education level: 6th grade  Occupational History   Occupation: retired  Tobacco Use   Smoking status: Former    Current packs/day: 0.00    Average packs/day: 2.0 packs/day for 20.0 years (40.0 ttl pk-yrs)    Types: Cigarettes    Start date: 12/26/1969    Quit date: 12/26/1989    Years since quitting: 34.3   Smokeless tobacco: Never  Vaping Use   Vaping status: Never Used  Substance and Sexual Activity   Alcohol use: No   Drug use: No   Sexual activity: Not on file  Other Topics Concern   Not on file  Social History Narrative   Lives at home with wife, Martin. 2 Indoor dogs.   Social Drivers of Corporate investment banker Strain: Low Risk  (09/03/2023)   Received from [facility]   Overall Financial Resource Strain (CARDIA)    Difficulty of Paying Living Expenses: Not hard at all  Food Insecurity: Patient Declined (09/03/2023)   Received from [facility]   Hunger Vital Sign    Worried About Running Out of Food in the Last Year: Patient declined    Ran Out of Food in the Last Year: Patient declined  Transportation Needs: No Transportation  Needs (10/05/2018)   PRAPARE - Administrator, Civil Service (Medical): No    Lack of Transportation (Non-Medical): No  Physical Activity: Inactive (10/31/2019)   Exercise Vital Sign    Days of Exercise per Week: 0 days    Minutes of Exercise per Session: 0 min  Stress: No Stress Concern Present (10/31/2019)   [employer] of Occupational Health - Occupational Stress Questionnaire    Feeling of Stress : Not at all  Social Connections: Unknown (10/05/2018)   Social  Connection and Isolation Panel [NHANES]    Frequency of Communication with Friends and Family: Patient declined    Frequency of Social Gatherings with Friends and Family: Patient declined    Attends Religious Services: Patient declined    Database administrator or Organizations: Patient declined    Attends Banker Meetings: Patient declined    Marital Status: Patient declined     Family History:  The patient's family history includes Heart attack in his brother.  ROS:   12-point review of systems is negative unless otherwise noted in the HPI.   EKGs/Labs/Other Studies Reviewed:    Studies reviewed were summarized above. The additional studies were reviewed today:  2D echo 02/02/2024: 1. Left ventricular ejection fraction, by estimation, is 60 to 65%. The  left ventricle has normal function. The left ventricle has no regional  wall motion abnormalities. Left ventricular diastolic parameters were  normal.   2. Right ventricular systolic function is normal. The right ventricular  size is normal. Tricuspid regurgitation signal is inadequate for assessing  PA pressure.   3. Left atrial size was mildly dilated.   4. The mitral valve is normal in structure. Mild mitral valve  regurgitation. No evidence of mitral stenosis.   5. The aortic valve is normal in structure. There is moderate  calcification of the aortic valve. Aortic valve regurgitation is not  visualized. Aortic valve sclerosis/calcification is present, without any  evidence of aortic stenosis. Aortic valve area,  by VTI measures 2.20 cm. Aortic valve mean gradient measures 11.0 mmHg.  Aortic valve Vmax measures 2.31 m/s.   6. The inferior vena cava is normal in size with greater than 50%  respiratory variability, suggesting right atrial pressure of 3 mmHg.  __________  Zio patch 06/2020: Atrial Flutter occurred continuously (100% burden), ranging from 51-139 bpm (avg of 80 bpm).    Isolated VEs were rare  (<1.0%), VE Couplets were rare (<1.0%), and no VE Triplets were present.   Patient triggered events were not associated with significant arrhythmia __________   2D echo 06/06/2020: 1. Left ventricular ejection fraction, by estimation, is 60 to 65%. The  left ventricle has normal function. The left ventricle has no regional  wall motion abnormalities. There is mild left ventricular hypertrophy.  Left ventricular diastolic parameters  are indeterminate.   2. Right ventricular systolic function is normal. The right ventricular  size is normal. There is normal pulmonary artery systolic pressure.   3. Left atrial size was moderately dilated.   4. The aortic valve was not well visualized. Aortic valve regurgitation  is not visualized. No aortic stenosis is present.  __________   2D echo 04/24/2018: - Procedure narrative: Transthoracic echocardiography. Image    quality was suboptimal. The study was technically difficult, as a    result of poor acoustic windows and body habitus.  - Left ventricle: The cavity size was normal. There was mild    concentric hypertrophy. Systolic function was normal. The  estimated ejection fraction was in the range of 55% to 60%. Left    ventricular diastolic function parameters were normal.  - Left atrium: The atrium was mildly dilated.  - Right atrium: The atrium was mildly dilated.  - Pulmonary arteries: Systolic pressure could not be accurately    estimated.   Impressions:   - There was no evidence of a vegetation.  __________   2D echo 09/09/2015: - Procedure narrative: Transthoracic echocardiography. The study    was technically difficult.  - Left ventricle: The cavity size was normal. Systolic function was    normal. Wall motion was normal; there were no regional wall    motion abnormalities. Doppler parameters are consistent with    abnormal left ventricular relaxation (grade 1 diastolic    dysfunction).  - Left atrium: The atrium was at the  upper limits of normal in    size.  - Right ventricle: Systolic function was normal.  - Pulmonary arteries: Systolic pressure was within the normal    range.   Impressions:   - Normal study. No source of TIA or CVA noted.  __________   2D echo 04/03/2011: - Left ventricle: The cavity size was normal. There was mild    concentric hypertrophy. Systolic function was normal. The    estimated ejection fraction was in the range of 55% to 60%. Wall    motion was normal; there were no regional wall motion    abnormalities. Doppler parameters are consistent with abnormal    left ventricular relaxation (grade 1 diastolic dysfunction).  - Right ventricle: Systolic function was normal.  - Pulmonary arteries: Systolic pressure was within the normal range.  Impressions:   - Diastolic dysfunction noted, otherwise a Normal study.    EKG:  EKG is ordered today.  The EKG ordered today demonstrates sinus bradycardia, 58 bpm, T wave inversion in aVL  Recent Labs: 05/27/2023: ALT 19 04/12/2024: BUN 44; Creatinine, Ser 2.32; Hemoglobin 13.9; Platelets 127; Potassium 5.7; Sodium 136  Recent Lipid Panel    Component Value Date/Time   CHOL 143 06/08/2022 0945   CHOL 93 04/03/2013 0209   TRIG 292 (H) 06/08/2022 0945   TRIG 163 04/03/2013 0209   HDL 23 (L) 06/08/2022 0945   HDL 25 (L) 04/03/2013 0209   CHOLHDL 6.2 (H) 06/08/2022 0945   CHOLHDL 6.6 09/09/2015 0501   VLDL UNABLE TO CALCULATE IF TRIGLYCERIDE OVER 400 mg/dL 16/09/9603 5409   VLDL 33 04/03/2013 0209   LDLCALC 73 06/08/2022 0945   LDLCALC 35 04/03/2013 0209    PHYSICAL EXAM:    VS:  BP 126/62   Pulse (!) 58   Ht 5\' 10"  (1.778 m)   Wt 269 lb 9.6 oz (122.3 kg)   SpO2 96%   BMI 38.68 kg/m   BMI: Body mass index is 38.68 kg/m.  Physical Exam Vitals reviewed.  Constitutional:      General: He is awake. He is in acute distress.     Appearance: He is well-developed.     Comments: Hand gripping the center of the chest throughout  his entire visit.  HENT:     Head: Normocephalic and atraumatic.  Eyes:     General:        Right eye: No discharge.        Left eye: No discharge.  Neck:     Vascular: JVD present.     Comments: JVD elevated 8 cm. Cardiovascular:     Rate and Rhythm: Normal rate and regular  rhythm.     Heart sounds: Normal heart sounds, S1 normal and S2 normal. Heart sounds not distant. No midsystolic click and no opening snap. No murmur heard.    No friction rub.     Comments: Palpation of the anterior chest wall does not reproduce chest pain. Pulmonary:     Effort: Pulmonary effort is normal. No respiratory distress.     Breath sounds: Normal breath sounds. No decreased breath sounds, wheezing, rhonchi or rales.  Chest:     Chest wall: No tenderness.  Abdominal:     General: There is distension.     Tenderness: There is no abdominal tenderness.  Musculoskeletal:     Cervical back: Normal range of motion.     Right lower leg: Edema present.     Left lower leg: Edema present.     Comments: 2+ lower extremity woody edema with chronic hyperpigmentation.  Skin:    General: Skin is warm and dry.     Nails: There is no clubbing.  Neurological:     Mental Status: He is alert and oriented to person, place, and time.  Psychiatric:        Speech: Speech normal.        Behavior: Behavior normal. Behavior is cooperative.        Thought Content: Thought content normal.        Judgment: Judgment normal.     Wt Readings from Last 3 Encounters:  04/12/24 269 lb 10 oz (122.3 kg)  04/12/24 269 lb 9.6 oz (122.3 kg)  01/25/24 279 lb 12.8 oz (126.9 kg)     ASSESSMENT & PLAN:   Precordial pain: Patient reports active 9 out of 10 chest pain in the office this afternoon and is in obvious discomfort.  EKG without acute ischemic changes.  However, based on ongoing chest pain symptoms he will need to be evaluated in the ED.  Aspirin 81 mg x 4 given in the office.  Patient transported by clinical staff to the  ED for further evaluation and management with recommendation to follow-up pending results.  Persistent atrial flutter: Maintaining sinus rhythm with a bradycardic rate on amiodarone 200 mg daily.  Recent LFTs and TSH normal.  CHA2DS2-VASc at least 7 (CHF, HTN, age x 1, DM, TIA, vascular disease).  He remains on apixaban 5 mg twice daily and does not meet reduced dosing criteria.  Acute on chronic HFpEF: Appears volume up on exam.  This may be contributing to his chest discomfort.  Currently taking torsemide 20 mg daily.  May need IV Lasix in the ED with further adjustment of diuretic therapy thereafter.  Spironolactone is currently on his medication list though per his daughter he is not taking this.  Nephrology has recommended the patient take finerenone 20 mg, patient and daughter are unclear if he is taking this.  He is taking Farxiga 10 mg.  HTN: Blood pressure stable in the office.  HLD: Triglyceride 402 in 12/2023 with LDL 73 in 05/2022.  Recommend obtaining fasting lipid panel in follow-up with escalation of pharmacotherapy as indicated.  CKD stage IIIb: Followed by nephrology.  OSA: Continue CPAP.     Disposition: F/u with Dr. Gollan or an APP after ED evaluation with possible hospital admission.   Medication Adjustments/Labs and Tests Ordered: Current medicines are reviewed at length with the patient today.  Concerns regarding medicines are outlined above. Medication changes, Labs and Tests ordered today are summarized above and listed in the Patient Instructions accessible in Encounters.  Signed, Varney Gentleman, PA-C 04/12/2024 4:43 PM     [facility] [place] [address] [place], [place] [address] [phone]

## 2024-04-12 ENCOUNTER — Other Ambulatory Visit: Payer: Self-pay | Admitting: Emergency Medicine

## 2024-04-12 ENCOUNTER — Ambulatory Visit: Admitting: Physician Assistant

## 2024-04-12 ENCOUNTER — Emergency Department

## 2024-04-12 ENCOUNTER — Other Ambulatory Visit: Payer: Self-pay

## 2024-04-12 ENCOUNTER — Encounter: Payer: Self-pay | Admitting: Physician Assistant

## 2024-04-12 ENCOUNTER — Ambulatory Visit: Attending: Physician Assistant | Admitting: Physician Assistant

## 2024-04-12 ENCOUNTER — Observation Stay

## 2024-04-12 ENCOUNTER — Observation Stay
Admission: EM | Admit: 2024-04-12 | Discharge: 2024-04-14 | Disposition: A | Discharge status: Home / Self Care | Attending: Internal Medicine | Admitting: Internal Medicine

## 2024-04-12 VITALS — BP 126/62 | HR 58 | Ht 70.0 in | Wt 269.6 lb

## 2024-04-12 DIAGNOSIS — N179 Acute kidney failure, unspecified: Secondary | ICD-10-CM | POA: Diagnosis not present

## 2024-04-12 DIAGNOSIS — R079 Chest pain, unspecified: Principal | ICD-10-CM | POA: Diagnosis present

## 2024-04-12 DIAGNOSIS — R7989 Other specified abnormal findings of blood chemistry: Secondary | ICD-10-CM | POA: Diagnosis present

## 2024-04-12 DIAGNOSIS — D696 Thrombocytopenia, unspecified: Secondary | ICD-10-CM | POA: Diagnosis present

## 2024-04-12 DIAGNOSIS — Z86711 Personal history of pulmonary embolism: Secondary | ICD-10-CM | POA: Diagnosis not present

## 2024-04-12 DIAGNOSIS — I200 Unstable angina: Principal | ICD-10-CM

## 2024-04-12 DIAGNOSIS — I82403 Acute embolism and thrombosis of unspecified deep veins of lower extremity, bilateral: Secondary | ICD-10-CM | POA: Diagnosis not present

## 2024-04-12 DIAGNOSIS — I25118 Atherosclerotic heart disease of native coronary artery with other forms of angina pectoris: Secondary | ICD-10-CM

## 2024-04-12 DIAGNOSIS — Z794 Long term (current) use of insulin: Secondary | ICD-10-CM | POA: Insufficient documentation

## 2024-04-12 DIAGNOSIS — E875 Hyperkalemia: Secondary | ICD-10-CM | POA: Diagnosis not present

## 2024-04-12 DIAGNOSIS — I5033 Acute on chronic diastolic (congestive) heart failure: Secondary | ICD-10-CM

## 2024-04-12 DIAGNOSIS — E785 Hyperlipidemia, unspecified: Secondary | ICD-10-CM | POA: Diagnosis present

## 2024-04-12 DIAGNOSIS — I480 Paroxysmal atrial fibrillation: Secondary | ICD-10-CM | POA: Diagnosis not present

## 2024-04-12 DIAGNOSIS — Z6838 Body mass index (BMI) 38.0-38.9, adult: Secondary | ICD-10-CM | POA: Insufficient documentation

## 2024-04-12 DIAGNOSIS — E669 Obesity, unspecified: Secondary | ICD-10-CM | POA: Diagnosis present

## 2024-04-12 DIAGNOSIS — N1831 Chronic kidney disease, stage 3a: Secondary | ICD-10-CM | POA: Insufficient documentation

## 2024-04-12 DIAGNOSIS — I130 Hypertensive heart and chronic kidney disease with heart failure and stage 1 through stage 4 chronic kidney disease, or unspecified chronic kidney disease: Secondary | ICD-10-CM | POA: Diagnosis not present

## 2024-04-12 DIAGNOSIS — Z79899 Other long term (current) drug therapy: Secondary | ICD-10-CM | POA: Insufficient documentation

## 2024-04-12 DIAGNOSIS — G4733 Obstructive sleep apnea (adult) (pediatric): Secondary | ICD-10-CM | POA: Diagnosis not present

## 2024-04-12 DIAGNOSIS — Z7901 Long term (current) use of anticoagulants: Secondary | ICD-10-CM | POA: Insufficient documentation

## 2024-04-12 DIAGNOSIS — I5032 Chronic diastolic (congestive) heart failure: Secondary | ICD-10-CM | POA: Diagnosis present

## 2024-04-12 DIAGNOSIS — Z87891 Personal history of nicotine dependence: Secondary | ICD-10-CM | POA: Insufficient documentation

## 2024-04-12 DIAGNOSIS — I483 Typical atrial flutter: Secondary | ICD-10-CM

## 2024-04-12 DIAGNOSIS — I10 Essential (primary) hypertension: Secondary | ICD-10-CM | POA: Diagnosis present

## 2024-04-12 DIAGNOSIS — R072 Precordial pain: Secondary | ICD-10-CM

## 2024-04-12 DIAGNOSIS — G459 Transient cerebral ischemic attack, unspecified: Secondary | ICD-10-CM | POA: Diagnosis present

## 2024-04-12 DIAGNOSIS — N183 Chronic kidney disease, stage 3 unspecified: Secondary | ICD-10-CM | POA: Diagnosis present

## 2024-04-12 DIAGNOSIS — F32A Depression, unspecified: Secondary | ICD-10-CM | POA: Diagnosis not present

## 2024-04-12 DIAGNOSIS — E1122 Type 2 diabetes mellitus with diabetic chronic kidney disease: Secondary | ICD-10-CM | POA: Diagnosis not present

## 2024-04-12 DIAGNOSIS — E782 Mixed hyperlipidemia: Secondary | ICD-10-CM | POA: Diagnosis present

## 2024-04-12 DIAGNOSIS — I82409 Acute embolism and thrombosis of unspecified deep veins of unspecified lower extremity: Secondary | ICD-10-CM | POA: Diagnosis present

## 2024-04-12 DIAGNOSIS — I25110 Atherosclerotic heart disease of native coronary artery with unstable angina pectoris: Secondary | ICD-10-CM | POA: Diagnosis not present

## 2024-04-12 HISTORY — DX: Chronic diastolic (congestive) heart failure: I50.32

## 2024-04-12 HISTORY — DX: Unspecified atrial flutter: I48.92

## 2024-04-12 LAB — CBC
HCT: 43 % (ref 39.0–52.0)
Hemoglobin: 13.9 g/dL (ref 13.0–17.0)
MCH: 27.2 pg (ref 26.0–34.0)
MCHC: 32.3 g/dL (ref 30.0–36.0)
MCV: 84.1 fL (ref 80.0–100.0)
Platelets: 127 10*3/uL — ABNORMAL LOW (ref 150–400)
RBC: 5.11 MIL/uL (ref 4.22–5.81)
RDW: 16.2 % — ABNORMAL HIGH (ref 11.5–15.5)
WBC: 6.9 10*3/uL (ref 4.0–10.5)
nRBC: 0 % (ref 0.0–0.2)

## 2024-04-12 LAB — TROPONIN I (HIGH SENSITIVITY)
Troponin I (High Sensitivity): 8 ng/L (ref <18)
Troponin I (High Sensitivity): 8 ng/L (ref <18)

## 2024-04-12 LAB — BASIC METABOLIC PANEL WITH GFR
Anion gap: 9 (ref 5–15)
BUN: 44 mg/dL — ABNORMAL HIGH (ref 8–23)
CO2: 32 mmol/L (ref 22–32)
Calcium: 8.9 mg/dL (ref 8.9–10.3)
Chloride: 95 mmol/L — ABNORMAL LOW (ref 98–111)
Creatinine, Ser: 2.32 mg/dL — ABNORMAL HIGH (ref 0.61–1.24)
GFR, Estimated: 29 mL/min — ABNORMAL LOW (ref >60)
Glucose, Bld: 126 mg/dL — ABNORMAL HIGH (ref 70–99)
Potassium: 5.7 mmol/L — ABNORMAL HIGH (ref 3.5–5.1)
Sodium: 136 mmol/L (ref 135–145)

## 2024-04-12 LAB — CBG MONITORING, ED: Glucose-Capillary: 83 mg/dL (ref 70–99)

## 2024-04-12 LAB — PROTIME-INR
INR: 1.2 (ref 0.8–1.2)
Prothrombin Time: 15.9 s — ABNORMAL HIGH (ref 11.4–15.2)

## 2024-04-12 LAB — BRAIN NATRIURETIC PEPTIDE: B Natriuretic Peptide: 32.1 pg/mL (ref 0.0–100.0)

## 2024-04-12 LAB — D-DIMER, QUANTITATIVE: D-Dimer, Quant: 1.69 ug{FEU}/mL — ABNORMAL HIGH (ref 0.00–0.50)

## 2024-04-12 LAB — APTT: aPTT: 37 s — ABNORMAL HIGH (ref 24–36)

## 2024-04-12 MED ORDER — METOPROLOL TARTRATE 50 MG PO TABS
50.0000 mg | ORAL_TABLET | Freq: Two times a day (BID) | ORAL | Status: DC
  Administered 2024-04-12 – 2024-04-14 (×3): 50 mg via ORAL
  Filled 2024-04-12 (×2): qty 1
  Filled 2024-04-12: qty 2
  Filled 2024-04-12: qty 1

## 2024-04-12 MED ORDER — ONDANSETRON HCL 4 MG/2ML IJ SOLN: 4.0000 mg | Freq: Three times a day (TID) | INTRAMUSCULAR | Status: DC | PRN

## 2024-04-12 MED ORDER — SODIUM CHLORIDE 0.9 % IV BOLUS
1000.0000 mL | Freq: Once | INTRAVENOUS | Status: AC
  Administered 2024-04-12: 1000 mL via INTRAVENOUS

## 2024-04-12 MED ORDER — INSULIN ASPART 100 UNIT/ML IJ SOLN
0.0000 [IU] | Freq: Every day | INTRAMUSCULAR | Status: DC
  Administered 2024-04-13: 2 [IU] via SUBCUTANEOUS
  Filled 2024-04-12 (×2): qty 1

## 2024-04-12 MED ORDER — INSULIN ASPART 100 UNIT/ML IV SOLN
8.0000 [IU] | Freq: Once | INTRAVENOUS | Status: AC
  Administered 2024-04-12: 8 [IU] via INTRAVENOUS
  Filled 2024-04-12: qty 0.08

## 2024-04-12 MED ORDER — GABAPENTIN 100 MG PO CAPS
100.0000 mg | ORAL_CAPSULE | Freq: Two times a day (BID) | ORAL | Status: DC
  Administered 2024-04-13 – 2024-04-14 (×4): 100 mg via ORAL
  Filled 2024-04-12 (×4): qty 1

## 2024-04-12 MED ORDER — TAMSULOSIN HCL 0.4 MG PO CAPS
0.4000 mg | ORAL_CAPSULE | Freq: Every day | ORAL | Status: DC
  Administered 2024-04-13 – 2024-04-14 (×2): 0.4 mg via ORAL
  Filled 2024-04-12 (×2): qty 1

## 2024-04-12 MED ORDER — INSULIN ASPART 100 UNIT/ML IJ SOLN
0.0000 [IU] | Freq: Three times a day (TID) | INTRAMUSCULAR | Status: DC
  Administered 2024-04-13: 3 [IU] via SUBCUTANEOUS
  Administered 2024-04-13 – 2024-04-14 (×3): 1 [IU] via SUBCUTANEOUS
  Filled 2024-04-12 (×3): qty 1

## 2024-04-12 MED ORDER — VITAMIN D 25 MCG (1000 UNIT) PO TABS
1000.0000 [IU] | ORAL_TABLET | Freq: Two times a day (BID) | ORAL | Status: DC
  Administered 2024-04-12 – 2024-04-14 (×4): 1000 [IU] via ORAL
  Filled 2024-04-12 (×4): qty 1

## 2024-04-12 MED ORDER — IOHEXOL 350 MG/ML SOLN
75.0000 mL | Freq: Once | INTRAVENOUS | Status: AC | PRN
  Administered 2024-04-12: 75 mL via INTRAVENOUS

## 2024-04-12 MED ORDER — NORTRIPTYLINE HCL 10 MG PO CAPS
10.0000 mg | ORAL_CAPSULE | Freq: Every day | ORAL | Status: DC
  Administered 2024-04-12 – 2024-04-13 (×2): 10 mg via ORAL
  Filled 2024-04-12 (×2): qty 1

## 2024-04-12 MED ORDER — CYCLOBENZAPRINE HCL 10 MG PO TABS
10.0000 mg | ORAL_TABLET | Freq: Three times a day (TID) | ORAL | Status: DC
  Administered 2024-04-12 – 2024-04-14 (×5): 10 mg via ORAL
  Filled 2024-04-12 (×5): qty 1

## 2024-04-12 MED ORDER — ASPIRIN 81 MG PO CHEW
324.0000 mg | CHEWABLE_TABLET | Freq: Once | ORAL | Status: DC
  Administered 2024-04-12: 324 mg via ORAL

## 2024-04-12 MED ORDER — SODIUM ZIRCONIUM CYCLOSILICATE 10 G PO PACK
10.0000 g | PACK | Freq: Once | ORAL | Status: AC
  Administered 2024-04-12: 10 g via ORAL
  Filled 2024-04-12: qty 1

## 2024-04-12 MED ORDER — HEPARIN BOLUS VIA INFUSION
5000.0000 [IU] | Freq: Once | INTRAVENOUS | Status: AC
  Administered 2024-04-12: 5000 [IU] via INTRAVENOUS
  Filled 2024-04-12: qty 5000

## 2024-04-12 MED ORDER — HEPARIN (PORCINE) 25000 UT/250ML-% IV SOLN
1400.0000 [IU]/h | INTRAVENOUS | Status: DC
  Administered 2024-04-12 – 2024-04-13 (×2): 1500 [IU]/h via INTRAVENOUS
  Administered 2024-04-14: 1400 [IU]/h via INTRAVENOUS
  Filled 2024-04-12 (×3): qty 250

## 2024-04-12 MED ORDER — OXYCODONE-ACETAMINOPHEN 5-325 MG PO TABS
1.0000 | ORAL_TABLET | ORAL | Status: DC | PRN
  Administered 2024-04-13 – 2024-04-14 (×2): 1 via ORAL
  Filled 2024-04-12 (×2): qty 1

## 2024-04-12 MED ORDER — PANTOPRAZOLE SODIUM 20 MG PO TBEC
20.0000 mg | DELAYED_RELEASE_TABLET | Freq: Every day | ORAL | Status: DC
  Administered 2024-04-13 – 2024-04-14 (×2): 20 mg via ORAL
  Filled 2024-04-12 (×2): qty 1

## 2024-04-12 MED ORDER — NITROGLYCERIN 0.4 MG SL SUBL: 0.4000 mg | SUBLINGUAL_TABLET | SUBLINGUAL | Status: DC | PRN

## 2024-04-12 MED ORDER — LORATADINE 10 MG PO TABS: 10.0000 mg | ORAL_TABLET | Freq: Every day | ORAL | Status: DC | PRN

## 2024-04-12 MED ORDER — DEXTROSE 50 % IV SOLN
25.0000 mL | Freq: Once | INTRAVENOUS | Status: AC
  Administered 2024-04-12: 25 mL via INTRAVENOUS
  Filled 2024-04-12: qty 50

## 2024-04-12 MED ORDER — OXYCODONE-ACETAMINOPHEN 5-325 MG PO TABS
1.0000 | ORAL_TABLET | ORAL | Status: DC | PRN
  Filled 2024-04-12: qty 1

## 2024-04-12 MED ORDER — FENTANYL CITRATE PF 50 MCG/ML IJ SOSY
100.0000 ug | PREFILLED_SYRINGE | Freq: Once | INTRAMUSCULAR | Status: AC
  Administered 2024-04-12: 100 ug via INTRAVENOUS
  Filled 2024-04-12: qty 2

## 2024-04-12 MED ORDER — PREGABALIN 75 MG PO CAPS
200.0000 mg | ORAL_CAPSULE | Freq: Three times a day (TID) | ORAL | Status: DC
  Administered 2024-04-12 – 2024-04-14 (×5): 200 mg via ORAL
  Filled 2024-04-12 (×4): qty 1
  Filled 2024-04-12: qty 4

## 2024-04-12 MED ORDER — HYDRALAZINE HCL 20 MG/ML IJ SOLN: 5.0000 mg | INTRAMUSCULAR | Status: DC | PRN

## 2024-04-12 MED ORDER — MORPHINE SULFATE (PF) 2 MG/ML IV SOLN
2.0000 mg | INTRAVENOUS | Status: DC | PRN
  Administered 2024-04-13 – 2024-04-14 (×2): 2 mg via INTRAVENOUS
  Filled 2024-04-12 (×2): qty 1

## 2024-04-12 MED ORDER — MORPHINE SULFATE (PF) 4 MG/ML IV SOLN
4.0000 mg | Freq: Once | INTRAVENOUS | Status: AC
  Administered 2024-04-12: 4 mg via INTRAVENOUS
  Filled 2024-04-12: qty 1

## 2024-04-12 MED ORDER — ASPIRIN 81 MG PO TBEC
81.0000 mg | DELAYED_RELEASE_TABLET | Freq: Every day | ORAL | Status: DC
  Administered 2024-04-13 – 2024-04-14 (×2): 81 mg via ORAL
  Filled 2024-04-12 (×2): qty 1

## 2024-04-12 MED ORDER — DUTASTERIDE 0.5 MG PO CAPS
0.5000 mg | ORAL_CAPSULE | Freq: Every day | ORAL | Status: DC
  Administered 2024-04-13 – 2024-04-14 (×2): 0.5 mg via ORAL
  Filled 2024-04-12 (×2): qty 1

## 2024-04-12 MED ORDER — ROPINIROLE HCL 1 MG PO TABS
2.0000 mg | ORAL_TABLET | Freq: Two times a day (BID) | ORAL | Status: DC | PRN
  Administered 2024-04-13: 2 mg via ORAL
  Filled 2024-04-12: qty 2

## 2024-04-12 MED ORDER — OMEGA-3-ACID ETHYL ESTERS 1 G PO CAPS
1.0000 g | ORAL_CAPSULE | Freq: Two times a day (BID) | ORAL | Status: DC
  Administered 2024-04-13 – 2024-04-14 (×3): 1 g via ORAL
  Filled 2024-04-12 (×3): qty 1

## 2024-04-12 MED ORDER — AMIODARONE HCL 200 MG PO TABS
200.0000 mg | ORAL_TABLET | Freq: Two times a day (BID) | ORAL | Status: DC
  Administered 2024-04-12 – 2024-04-13 (×2): 200 mg via ORAL
  Filled 2024-04-12 (×2): qty 1

## 2024-04-12 MED ORDER — INSULIN GLARGINE-YFGN 100 UNIT/ML ~~LOC~~ SOLN
30.0000 [IU] | Freq: Two times a day (BID) | SUBCUTANEOUS | Status: DC
  Administered 2024-04-13 – 2024-04-14 (×4): 30 [IU] via SUBCUTANEOUS
  Filled 2024-04-12 (×5): qty 0.3

## 2024-04-12 MED ORDER — ROPINIROLE HCL 1 MG PO TABS
1.0000 mg | ORAL_TABLET | Freq: Two times a day (BID) | ORAL | Status: DC
  Administered 2024-04-13 – 2024-04-14 (×4): 1 mg via ORAL
  Filled 2024-04-12 (×4): qty 1

## 2024-04-12 MED ORDER — OXYCODONE HCL ER 20 MG PO T12A
40.0000 mg | EXTENDED_RELEASE_TABLET | Freq: Two times a day (BID) | ORAL | Status: DC
  Administered 2024-04-12 – 2024-04-14 (×4): 40 mg via ORAL
  Filled 2024-04-12: qty 4
  Filled 2024-04-12 (×3): qty 2

## 2024-04-12 MED ORDER — MAGNESIUM OXIDE -MG SUPPLEMENT 400 (240 MG) MG PO TABS
400.0000 mg | ORAL_TABLET | Freq: Two times a day (BID) | ORAL | Status: DC
  Administered 2024-04-12 – 2024-04-14 (×4): 400 mg via ORAL
  Filled 2024-04-12 (×4): qty 1

## 2024-04-12 MED ORDER — ACETAMINOPHEN 325 MG PO TABS: 650.0000 mg | ORAL_TABLET | Freq: Four times a day (QID) | ORAL | Status: DC | PRN

## 2024-04-12 MED ORDER — GABAPENTIN 300 MG PO CAPS
300.0000 mg | ORAL_CAPSULE | Freq: Every day | ORAL | Status: DC
  Administered 2024-04-12 – 2024-04-13 (×2): 300 mg via ORAL
  Filled 2024-04-12 (×2): qty 1

## 2024-04-12 NOTE — Consult Note (Signed)
 PHARMACY - ANTICOAGULATION CONSULT NOTE  Pharmacy Consult for Heparin Indication: atrial fibrillation  Allergies  Allergen Reactions   Carisoprodol Itching and Nausea And Vomiting    Reaction:  Unknown   Sensitivity   Reaction:  Unknown     Reaction:  Unknown     Sensitivity     Reaction:  Unknown    Patient Measurements: Height: 5\' 10"  (177.8 cm) Weight: 122.3 kg (269 lb 10 oz) IBW/kg (Calculated) : 73 HEPARIN DW (KG): 100.6  Vital Signs: Temp: 98.2 F (36.8 C) (04/17 1944) Temp Source: Oral (04/17 1944) BP: 121/57 (04/17 2000) Pulse Rate: 56 (04/17 2000)  Labs: Recent Labs    04/12/24 1559 04/12/24 1754  HGB 13.9  --   HCT 43.0  --   PLT 127*  --   APTT 37*  --   CREATININE 2.32*  --   TROPONINIHS 8 8    Estimated Creatinine Clearance: 36.6 mL/min (A) (by C-G formula based on SCr of 2.32 mg/dL (H)).   Medical History: 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    x2   GERD (gastroesophageal reflux disease)    Headache    h/o as a child   Hypercholesteremia    Hypertension    Kidney failure    acute   Neuromuscular disorder (HCC)    Neuropathy    Pneumonia    time 9 ;last episode 12/2015   Pulmonary embolus (HCC) 2011   Seasonal allergies    Seizures (HCC)    as child    Sleep apnea    BIPAP   Stiff neck    limited turning s/p titanium plate placement   TIA (transient ischemic attack)    Wears dentures    full upper and lower    Pertinent Medications:  Apixaban 5mg  twice dailiy Last dose: morning of 4/17  Assessment: 73 y.o. male with history of DM2, HTN, HLD, atrial flutter, HFpEF, paroxysmal A-fib(on apixaban PTA) presenting today for chest  pain.  Patient was seen by his cardiology team earlier today for ongoing chest pain symptoms.  He was sent into the ED for further evaluation. Plan for possible cardiac cath. Pharmacy has been consulted to initiate and monitor heparin infusion.  Baseline labs: aPTT 37sec, INR pending, Hgb 13.9, Plts 127(low at baseline  Goal of Therapy:  Heparin level 0.3-0.7 units/ml aPTT 66-102 seconds Monitor platelets by anticoagulation protocol: Yes   Plan:  Give 5000 units bolus x 1 Start heparin infusion at 1500 units/hr Check aPTT and HL level in 8 hours Will use aPTT to guide dosing until both levels correlate, then will transition to HL dosing Continue to monitor H&H and platelets  Cristal Don, PharmD Clinical Pharmacist 04/12/2024 10:26 PM

## 2024-04-12 NOTE — ED Triage Notes (Signed)
 Pt here with cp x2-3 days. Pt states he has been having pain really since Jan. Pt states pain is left sided and does not radiate and he describes it as something sitting on his chest. Pt endorses diarrhea and states yesterday he was dizzy.

## 2024-04-12 NOTE — H&P (Addendum)
 History and Physical    Jerry Parsons:[id number] DOB: [birth year] DOA: 04/12/2024  Referring MD/NP/PA:   PCP: Nikki Barters, MD   Patient coming from:  The patient is coming from home.     Chief Complaint: chest pain  HPI: Jerry Parsons is a 73 y.o. male with medical history significant of HTN, HLD, DM, dCHF, TIA, depression, BPH, CKD-3A, A-fib and PE/DVT on Eliquis , thrombocytopenia, chronic pain, right eye blindness, obesity, former smoker, who presents with chest pain.  Patient states that he has intermittent chest pain since January, which has worsened and been more persistent in the past 3 days.  The chest pain is located in the left side of chest, pressure-like, initially 9 out of 10 in severity, currently mild, nonradiating, aggravated by exertion.  Patient denies shortness of breath, cough, fever or chills.  He states that he is taking MiraLAX  and Colace, causing loose stool bowel movement.  No nausea, vomiting, abdominal pain.  No symptoms of UTI. Patient was seen by PA, Jerry Parsons in [place] today, and was sent pt to ED for further evaluation and treatment. Pt was given 325 mg ASA. Patient states that he had a fall few weeks ago, strongly denies head and neck injury.  He refused CT scan of head and neck.   Data reviewed independently and ED Course: pt was found to have trop  8 --> 8, WBC 6.9, PTT 37, potassium 5.7, worsening renal function with creatinine 2.32, BUN 44 and GFR 29 (recent baseline creatinine 1.6), D-dimer 1.69, BNP 32.1.  Chest x-ray negative.  CT negative for PE. Pt is placed in tele bed for obs. Dr. Junnie Olives of card is consulted.    CTA: 1. No pulmonary embolism. 2. Diffuse bronchial wall thickening in keeping with airway inflammation. 3. Moderate multi-vessel coronary artery calcification. 4. Morphologic changes in keeping with pulmonary arterial hypertension 5. Moderate splenomegaly, incompletely included on this examination.   Aortic  Atherosclerosis (ICD10-I70.0).     EKG: I have personally reviewed.  Sinus rhythm, QTc 426, early R wave question, nonspecific T wave change.   Review of Systems:   General: no fevers, chills, no body weight gain, has fatigue HEENT: no blurry vision, hearing changes or sore throat. has right eye blindness Respiratory: no dyspnea, coughing, wheezing CV: has chest pain, no palpitations GI: no nausea, vomiting, abdominal pain, constipation. Has loose stool bowel movement GU: no dysuria, burning on urination, increased urinary frequency, hematuria  Ext: has leg edema Neuro: no unilateral weakness, numbness, or tingling, no vision change or hearing loss Skin: no rash, no skin tear. MSK: No muscle spasm, no deformity, no limitation of range of movement in spin Heme: No easy bruising.  Travel history: No recent long distant travel.   Allergy:  Allergies  Allergen Reactions   Carisoprodol Itching and Nausea And Vomiting    Reaction:  Unknown   Sensitivity   Reaction:  Unknown     Reaction:  Unknown     Sensitivity     Reaction:  Unknown    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    x2  GERD (gastroesophageal reflux disease)    Headache    h/o as a child   Hypercholesteremia    Hypertension    Kidney failure    acute   Neuromuscular disorder (HCC)    Neuropathy    Pneumonia    time 48 ;last episode 12/2015   Pulmonary embolus (HCC) 2011   Seasonal allergies    Seizures (HCC)    as child    Sleep apnea    BIPAP   Stiff neck    limited turning s/p titanium plate placement   TIA (transient ischemic attack)    Wears dentures    full upper and lower    Past Surgical  History:  Procedure Laterality Date   BACK SURGERY     x 8; upper x 3 & lower x 5   CARDIOVERSION  03/14/13, 10/16   2014 - [facility], 2016 - [facility]   CARDIOVERSION N/A 09/11/2020   Procedure: CARDIOVERSION;  Surgeon: Devorah Fonder, MD;  Location: [facility] ORS;  Service: Cardiovascular;  Laterality: N/A;   CARDIOVERSION N/A 01/25/2024   Procedure: CARDIOVERSION;  Surgeon: Devorah Fonder, MD;  Location: [facility] ORS;  Service: Cardiovascular;  Laterality: N/A;   CATARACT EXTRACTION W/PHACO Left 10/29/2015   Procedure: CATARACT EXTRACTION PHACO AND INTRAOCULAR LENS PLACEMENT (IOC);  Surgeon: Annell Kidney, MD;  Location: [facility];  Service: Ophthalmology;  Laterality: Left;  DIABETIC - insulin  and oral medsSleep apnea - no machine   CATARACT EXTRACTION W/PHACO Right 02/16/2023   Procedure: CATARACT EXTRACTION PHACO AND INTRAOCULAR LENS PLACEMENT (IOC) RIGHT DIABETIC MALYUGIN HEALON 5 VISION BLUE  65.52  03:57.7;  Surgeon: Annell Kidney, MD;  Location: [facility];  Service: Ophthalmology;  Laterality: Right;  Diabetic   CHOLECYSTECTOMY     COLONOSCOPY WITH PROPOFOL  N/A 01/16/2018   Procedure: COLONOSCOPY WITH PROPOFOL ;  Surgeon: Cassie Click, MD;  Location: [facility] END[facility];  Service: Endoscopy;  Laterality: N/A;   ESOPHAGOGASTRODUODENOSCOPY (EGD) WITH PROPOFOL  N/A 01/16/2018   Procedure: ESOPHAGOGASTRODUODENOSCOPY (EGD) WITH PROPOFOL ;  Surgeon: Cassie Click, MD;  Location[facility];  Service: Endoscopy;  Laterality: N/A;   EYE SURGERY     GALLBLADDER SURGERY  2002   JOINT REPLACEMENT Right 2018   KNEE ARTHROSCOPY     left    PARATHYROIDECTOMY N/A 07/25/2020   Procedure: PARATHYROIDECTOMY;  Surgeon: Mercy Stall, MD;  Location: [facility] ORS;  Service: General;  Laterality: N/A;   ROTATOR CUFF REPAIR  2001   left    TEE WITHOUT CARDIOVERSION N/A 04/26/2018   Procedure: TRANSESOPHAGEAL ECHOCARDIOGRAM (TEE);  Surgeon: Sammy Crisp, MD;  Location: [facility] ORS;   Service: Cardiovascular;  Laterality: N/A;   TONSILLECTOMY     TOTAL KNEE ARTHROPLASTY Right 06/20/2017   Procedure: RIGHT TOTAL KNEE ARTHROPLASTY;  Surgeon: Liliane Rei, MD;  Location: [facility] ORS;  Service: Orthopedics;  Laterality: Right;    Social History:  reports that he quit smoking about 34 years ago. His smoking use included cigarettes. He started smoking about 54 years ago. He has a 40 pack-year smoking history. He has never used smokeless tobacco. He reports that he does not drink alcohol and does not use drugs.  Family History:  Family History  Problem Relation Age of Onset   Heart attack Brother      Prior to Admission medications   Medication Sig Start Date End Date Taking? Authorizing Provider  amiodarone  (PACERONE ) 200 MG tablet Take 1 tablet (200 mg total) by mouth daily. Patient taking differently: Take 200 mg by mouth  2 (two) times daily. 01/18/24  Yes Dunn, Elvia Hammans, PA-C  cholecalciferol  (VITAMIN D3) 25 MCG (1000 UNIT) tablet Take 1,000 Units by mouth 2 (two) times daily.    [provider]  Continuous Blood Gluc Receiver (FREESTYLE LIBRE 2 READER) DEVI Inject 1 Device into the skin daily. DGX 01/13/23   Simmons-Robinson, Makiera, MD  Continuous Blood Gluc Sensor (FREESTYLE LIBRE 2 SENSOR) MISC 2 Devices by Does not apply route daily. 01/13/23   Simmons-Robinson, Judyann Number, MD  cyclobenzaprine  (FLEXERIL ) 10 MG tablet Take 10 mg by mouth 3 (three) times daily.    [provider]  docusate sodium  (COLACE) 100 MG capsule Take 100 mg by mouth 2 (two) times daily.    [provider]  dutasteride  (AVODART ) 0.5 MG capsule Take 0.5 mg by mouth daily. 07/06/23   [provider]  ELIQUIS  5 MG TABS tablet TAKE 1 TABLET TWICE A DAY 09/30/23  Yes Gollan, Timothy J, MD  FARXIGA  10 MG TABS tablet TAKE 1 TABLET BY MOUTH EVERY DAY 10/04/22  Yes Ostwalt, Janna, PA-C  fexofenadine (ALLEGRA) 180 MG tablet Take 180 mg by mouth daily.   Yes [provider]   gabapentin  (NEURONTIN ) 100 MG capsule Take 100 mg by mouth 2 (two) times daily. 100 mg in the morning, 100 mg 3p   Yes [provider]  gabapentin  (NEURONTIN ) 300 MG capsule Take 1 capsule (300 mg total) by mouth at bedtime. 11/12/22  Yes Lamon Pillow, MD  Insulin  Glargine (BASAGLAR  KWIKPEN) 100 UNIT/ML INJECT 40 UNITS            SUBCUTANEOUSLY TWO TIMES A DAY Patient taking differently: Inject 44 Units into the skin 2 (two) times daily. 05/31/23  Yes Simmons-Robinson, Makiera, MD  magnesium  oxide (MAG-OX) 400 (240 Mg) MG tablet Take 1 tablet by mouth 2 (two) times daily. 02/29/24   [provider]  metoprolol  tartrate (LOPRESSOR ) 50 MG tablet Take by mouth. 10/27/15   [provider]  naloxone  (NARCAN ) nasal spray 4 mg/0.1 mL Please use in the event of overdose from narcotic medication 12/29/22  Yes Simmons-Robinson, Makiera, MD  nortriptyline  (PAMELOR ) 10 MG capsule TAKE 1 CAP BY MOUTH NIGHTLY X 1 WEEK, THEN INCREASE TO 2 CAPS NIGHTLY X 1 WEEK, THEN INCREASE TO 3 CAPS NIGHTLY 03/13/24  Yes [provider]  NOVOLOG  FLEXPEN 100 UNIT/ML FlexPen INJECT 14 UNITS            SUBCUTANEOUSLY IN THE      MORNING AND AT BEDTIME 04/26/22  Yes Nikki Barters, MD  Omega-3 Fatty Acids (FISH OIL) 1200 MG CAPS Take 2,400 mg by mouth 2 (two) times daily.    [provider]  [facility] ULTRA test strip USE WITH METER TWICE DAILY 09/13/22   Nikki Barters, MD  oxyCODONE  (OXYCONTIN ) 40 mg 12 hr tablet Take 1 tablet (40 mg total) by mouth every 8 (eight) hours as needed. 12/09/22  Yes Simmons-Robinson, Makiera, MD  pantoprazole  (PROTONIX ) 20 MG tablet Take by mouth.    [provider]  polyethylene glycol (MIRALAX  / GLYCOLAX ) 17 g packet Take 17 g by mouth daily.    [provider]  pregabalin  (LYRICA ) 200 MG capsule TAKE 1 CAPSULE 3 TIMES A   DAY 09/10/22  Yes Nikki Barters, MD  rOPINIRole  (REQUIP ) 1 MG tablet TAKE 1 TO 2 TABLETS BY MOUTH AT BEDTIME AS  NEEDED Patient taking differently: Take 1 mg by mouth 2 (two) times daily. 1 mg in the morning, 1  mg at 3p 07/21/22   Nikki Barters, MD  rOPINIRole  (REQUIP ) 2 MG tablet TAKE 1 TABLET BY MOUTH TWICE A DAY AS NEEDED Patient taking differently: Take 2 mg by mouth at bedtime. 09/07/22   Nikki Barters, MD  sitaGLIPtin  (JANUVIA ) 100 MG tablet Take by mouth. Patient not taking: Reported on 04/12/2024 12/26/15   [provider]  tamsulosin  (FLOMAX ) 0.4 MG CAPS capsule Take 0.4 mg by mouth daily. 10/12/20   [provider]  telmisartan (MICARDIS) 40 MG tablet Take by mouth. 03/22/24 03/22/25  [provider]  torsemide  (DEMADEX ) 20 MG tablet Take one tablet as needed for a weight gain of 2 lbs in 24 hrs or 5 lbs in a week. Patient taking differently: Take 20 mg by mouth daily. 12/16/23   Florette Hurry, NP    Physical Exam: Vitals:   04/12/24 1556 04/12/24 1558 04/12/24 1944 04/12/24 2000  BP:  (!) 136/57 (!) 121/49 (!) 121/57  Pulse:  (!) 56 (!) 56 (!) 56  Resp:  20 18 11   Temp:  98.6 F (37 C) 98.2 F (36.8 C)   TempSrc:  Oral Oral   SpO2:  94% 93% 91%  Weight: 122.3 kg     Height: 5\' 10"  (1.778 m)      General: Not in acute distress HEENT:       Eyes: left eye with PERRL, EOMI, no jaundice. Has right eye blindness       ENT: No discharge from the ears and nose, no pharynx injection, no tonsillar enlargement.        Neck: positive JVD, no bruit, no mass felt. Heme: No neck lymph node enlargement. Cardiac: S1/S2, RRR, No murmurs, No gallops or rubs. Respiratory: No rales, wheezing, rhonchi or rubs. GI: Soft, nondistended, nontender, no rebound pain, no organomegaly, BS present. GU: No hematuria Ext: 1+ pitting leg edema bilaterally.  Has venous insufficiency changes in both legs.  1+DP/PT pulse bilaterally. Musculoskeletal: No joint deformities, No joint redness or warmth, no limitation of ROM in spin. Skin: No rashes.  Neuro: Alert, oriented  X3, cranial nerves II-XII grossly intact (right eye blindness), moves all extremities normally. Psych: Patient is not psychotic, no suicidal or hemocidal ideation.  Labs on Admission: I have personally reviewed following labs and imaging studies  CBC: Recent Labs  Lab 04/12/24 1559  WBC 6.9  HGB 13.9  HCT 43.0  MCV 84.1  PLT 127*   Basic Metabolic Panel: Recent Labs  Lab 04/12/24 1559  NA 136  K 5.7*  CL 95*  CO2 32  GLUCOSE 126*  BUN 44*  CREATININE 2.32*  CALCIUM  8.9   GFR: Estimated Creatinine Clearance: 36.6 mL/min (A) (by C-G formula based on SCr of 2.32 mg/dL (H)). Liver Function Tests: No results for input(s): "AST", "ALT", "ALKPHOS", "BILITOT", "PROT", "ALBUMIN" in the last 168 hours. No results for input(s): "LIPASE", "AMYLASE" in the last 168 hours. No results for input(s): "AMMONIA" in the last 168 hours. Coagulation Profile: Recent Labs  Lab 04/12/24 1559  INR 1.2   Cardiac Enzymes: No results for input(s): "CKTOTAL", "CKMB", "CKMBINDEX", "TROPONINI" in the last 168 hours. BNP (last 3 results) No results for input(s): "PROBNP" in the last 8760 hours. HbA1C: No results for input(s): "HGBA1C" in the last 72 hours. CBG: Recent Labs  Lab 04/12/24 2218  GLUCAP 83   Lipid Profile: No results for input(s): "CHOL", "HDL", "LDLCALC", "TRIG", "CHOLHDL", "LDLDIRECT" in the last 72 hours. Thyroid  Function Tests: No results for input(s): "  TSH", "T4TOTAL", "FREET4", "T3FREE", "THYROIDAB" in the last 72 hours. Anemia Panel: No results for input(s): "VITAMINB12", "FOLATE", "FERRITIN", "TIBC", "IRON", "RETICCTPCT" in the last 72 hours. Urine analysis:    Component Value Date/Time   COLORURINE YELLOW (A) 04/22/2018 0255   APPEARANCEUR Clear 09/28/2023 0924   LABSPEC 1.016 04/22/2018 0255   LABSPEC 1.015 04/12/2013 1256   PHURINE 5.0 04/22/2018 0255   GLUCOSEU Trace (A) 09/28/2023 0924   GLUCOSEU >=500 04/12/2013 1256   HGBUR NEGATIVE 04/22/2018 0255    BILIRUBINUR Negative 09/28/2023 0924   BILIRUBINUR Negative 04/12/2013 1256   KETONESUR 5 (A) 04/22/2018 0255   PROTEINUR 1+ (A) 09/28/2023 0924   PROTEINUR NEGATIVE 04/22/2018 0255   UROBILINOGEN 0.2 09/18/2020 1310   UROBILINOGEN 0.2 02/05/2010 1041   NITRITE Negative 09/28/2023 0924   NITRITE NEGATIVE 04/22/2018 0255   LEUKOCYTESUR Negative 09/28/2023 0924   LEUKOCYTESUR Negative 04/12/2013 1256   Sepsis Labs: @LABRCNTIP (procalcitonin:4,lacticidven:4) )No results found for this or any previous visit (from the past 240 hours).   Radiological Exams on Admission:   Assessment/Plan Principal Problem:   Chest pain Active Problems:   Hyperkalemia   Hyperlipidemia   HTN (hypertension)   Chronic diastolic CHF (congestive heart failure) (HCC)   Acute renal failure superimposed on stage 3a chronic kidney disease (HCC)   TIA (transient ischemic attack)   Thrombocytopenia (HCC)   Paroxysmal atrial fibrillation (HCC)   Positive D dimer   DVT (deep venous thrombosis) (HCC)   Hx of pulmonary embolus   OSA on CPAP   Depression   Obesity (BMI 30-39.9)   Assessment and Plan:   Chest pain: trop 8 --> 8. CTA negative for PE. Consulted Dr. Junnie Olives - place to tele bed for observation - switch Eliquis  to IV heparin  - Trend Trop - prn Nitroglycerin , Morphine , percoct - start Aspirin , lipitor  - Risk factor stratification: will check FLP and A1C  - check UDS  Hyperkalemia: K 5.7 -will give D50 and 8 units of  - 10 g of Lokelma  - repeated K is 4  Hyperlipidemia -Lipitor is ordered  HTN (hypertension) - IV hydralazine  as needed - Metoprolol , - Hold Micardis and torsemide   Chronic diastolic CHF (congestive heart failure) [facility]): Patient has 1+ leg edema, but the BNP is normal 32.  Does not seem to have CHF exacerbation. -Hold torsemide  due to worsening renal function  Acute renal failure superimposed on stage 3a chronic kidney disease (HCC) -Hold torsemide  and  Micardis  TIA (transient ischemic attack) -Started Lipitor - Patient was Eliquis  for A-fib  Thrombocytopenia (HCC): This is chronic issue platelets are 127 -Followed by CBC  Paroxysmal atrial fibrillation [facility]): currently heart rate 58 - Switched Eliquis  to heparin  - Metoprolol  and amiodarone   Positive D dimer -Follow up lower extremity venous Doppler for DVT  DVT (deep venous thrombosis) (HCC) and Hx of pulmonary embolus -IV heparin  now  OSA on CPAP  Depression -Continue home medications  Obesity (BMI 30-39.9): Body weight 120 0.3 kg, BMI 38.68 - Encourage losing weight - Exercise and healthy diet      DVT ppx: IV Heparin     Code Status: Full code     Family Communication:     not done, no family member is at bed side.  Disposition Plan:  Anticipate discharge back to previous environment  Consults called:  Dr. Junnie Olives of card is consulted.   Admission status and Level of care: Telemetry Cardiac:    for obs     Dispo: The patient is from: Home  Anticipated d/c is to: Home              Anticipated d/c date is: 1 day              Patient currently is not medically stable to d/c.    Severity of Illness:  The appropriate patient status for this patient is OBSERVATION. Observation status is judged to be reasonable and necessary in order to provide the required intensity of service to ensure the patient's safety. The patient's presenting symptoms, physical exam findings, and initial radiographic and laboratory data in the context of their medical condition is felt to place them at decreased risk for further clinical deterioration. Furthermore, it is anticipated that the patient will be medically stable for discharge from the hospital within 2 midnights of admission.        Date of Service 04/12/2024    Fidencio Hue [facility]   If 7PM-7AM, please contact night-coverage [email or web address] 04/12/2024, 11:40 PM

## 2024-04-12 NOTE — ED Provider Notes (Signed)
 [facility] Provider Note    Event Date/Time   First MD Initiated Contact with Patient 04/12/24 1618     (approximate)   History   Chest Pain   HPI Jerry Parsons is a 73 y.o. male with history of DM2, HTN, HLD, atrial flutter, HFpEF, paroxysmal A-fib presenting today for chest pain.  Patient was seen by his cardiology team earlier today for ongoing chest pain symptoms.  He was sent into the ED for further evaluation.  He states the chest pain is becoming more frequent and even occurs at rest.  Is having some pain when he takes a deep breath in.  Denies any falls.  Having worsening shortness of breath.  Otherwise denies nausea, vomiting, lightheadedness, abdominal pain, diarrhea, constipation, dysuria.  Minor leg swelling but has improved since being on Lasix.  Chart review: Reviewed cardiology note from today indicating patient should come to the ED for further chest pain evaluation.  Echo performed 2 months ago with EF of 60 to 65%.     Physical Exam   Triage Vital Signs: ED Triage Vitals  Encounter Vitals Group     BP 04/12/24 1558 (!) 136/57     Systolic BP Percentile --      Diastolic BP Percentile --      Pulse Rate 04/12/24 1558 (!) 56     Resp 04/12/24 1558 20     Temp 04/12/24 1558 98.6 F (37 C)     Temp Source 04/12/24 1558 Oral     SpO2 04/12/24 1558 94 %     Weight 04/12/24 1556 269 lb 10 oz (122.3 kg)     Height 04/12/24 1556 5\' 10"  (1.778 m)     Head Circumference --      Peak Flow --      Pain Score 04/12/24 1556 9     Pain Loc --      Pain Education --      Exclude from Growth Chart --     Most recent vital signs: Vitals:   04/12/24 1944 04/12/24 2000  BP: (!) 121/49 (!) 121/57  Pulse: (!) 56 (!) 56  Resp: 18 11  Temp: 98.2 F (36.8 C)   SpO2: 93% 91%   Physical Exam: I have reviewed the vital signs and nursing notes. General: Awake, alert, no acute distress.  Nontoxic appearing. Head:  Atraumatic, normocephalic.    ENT:  EOM intact, PERRL. Oral mucosa is pink and moist with no lesions. Neck: Neck is supple with full range of motion, No meningeal signs. Cardiovascular:  RRR, No murmurs. Peripheral pulses palpable and equal bilaterally. Respiratory:  Symmetrical chest wall expansion.  No rhonchi, rales, or wheezes.  Good air movement throughout.  No use of accessory muscles.   Musculoskeletal:  No cyanosis or edema. Moving extremities with full ROM Abdomen:  Soft, nontender, nondistended. Neuro:  GCS 15, moving all four extremities, interacting appropriately. Speech clear. Psych:  Calm, appropriate.   Skin:  Warm, dry, no rash.    ED Results / Procedures / Treatments   Labs (all labs ordered are listed, but only abnormal results are displayed) Labs Reviewed  BASIC METABOLIC PANEL WITH GFR - Abnormal; Notable for the following components:      Result Value   Potassium 5.7 (*)    Chloride 95 (*)    Glucose, Bld 126 (*)    BUN 44 (*)    Creatinine, Ser 2.32 (*)    GFR, Estimated 29 (*)  All other components within normal limits  CBC - Abnormal; Notable for the following components:   RDW 16.2 (*)    Platelets 127 (*)    All other components within normal limits  APTT - Abnormal; Notable for the following components:   aPTT 37 (*)    All other components within normal limits  D-DIMER, QUANTITATIVE - Abnormal; Notable for the following components:   D-Dimer, Quant 1.69 (*)    All other components within normal limits  BRAIN NATRIURETIC PEPTIDE  TROPONIN I (HIGH SENSITIVITY)  TROPONIN I (HIGH SENSITIVITY)     EKG My EKG interpretation: Rate of 58, sinus bradycardia, incomplete right bundle branch block.  No acute ST elevations or depressions   RADIOLOGY Independently interpreted chest x-ray and CTA chest with no acute pathology   PROCEDURES:  Critical Care performed: No  Procedures   MEDICATIONS ORDERED IN ED: Medications  morphine (PF) 4 MG/ML injection 4 mg (4 mg  Intravenous Given 04/12/24 1747)  sodium chloride 0.9 % bolus 1,000 mL (0 mLs Intravenous Stopped 04/12/24 1930)  iohexol (OMNIPAQUE) 350 MG/ML injection 75 mL (75 mLs Intravenous Contrast Given 04/12/24 1823)  fentaNYL (SUBLIMAZE) injection 100 mcg (100 mcg Intravenous Given 04/12/24 1849)     IMPRESSION / MDM / ASSESSMENT AND PLAN / ED COURSE  I reviewed the triage vital signs and the nursing notes.                              Differential diagnosis includes, but is not limited to, unstable angina, PE, NSTEMI, pneumonia, pneumothorax, chest wall pain  Patient's presentation is most consistent with acute presentation with potential threat to life or bodily function.  Patient is a 73 year old male presenting today for left-sided dull chest pain occurring even at rest associated with some dyspnea and lightheadedness.  Vital signs are stable on arrival.  Although, during 1 visit in the room did see his oxygen dropped to 86% before coming back up.  Physical exam rather unremarkable aside from slight pretibial edema but has been on Lasix recently.  EKG consistent with his baseline.  Patient has CBC largely unremarkable.  His BMP does show new AKI with slight hyperkalemia.  I suspect this is in the setting of restarting his torsemide use with history of CKD.  Separately, was found to have elevated D-dimer so we will get CTA of the chest to rule out PE given prior history of DVT.  CTA chest shows no evidence of PE or pneumonia.  Troponin stable x 2.  Given ongoing chest pain with concern for unstable angina with cardiology today along with his AKI which I suspect is from torsemide, will admit to hospitalist for ongoing chest pain workup with likely stress test.  The patient is on the cardiac monitor to evaluate for evidence of arrhythmia and/or significant heart rate changes. Clinical Course as of 04/12/24 2051  Thu Apr 12, 2024  1648 Creatinine(!): 2.32 New AKI on CKD.  Potentially from Lasix use and  dehydration [DW]    Clinical Course User Index [DW] Janith Lima, MD     FINAL CLINICAL IMPRESSION(S) / ED DIAGNOSES   Final diagnoses:  Unstable angina (HCC)  AKI (acute kidney injury) (HCC)     Rx / DC Orders   ED Discharge Orders     None        Note:  This document was prepared using Dragon voice recognition software and may include unintentional  dictation errors.   Kandee Orion, MD 04/12/24 2052

## 2024-04-12 NOTE — ED Triage Notes (Signed)
 Patient to ED via [facility] for CP. Bradycardiac on EKG per RN. Given 345 ASA at clinic.

## 2024-04-13 ENCOUNTER — Observation Stay

## 2024-04-13 ENCOUNTER — Encounter: Payer: Self-pay | Admitting: Internal Medicine

## 2024-04-13 DIAGNOSIS — I259 Chronic ischemic heart disease, unspecified: Secondary | ICD-10-CM | POA: Diagnosis not present

## 2024-04-13 DIAGNOSIS — R079 Chest pain, unspecified: Secondary | ICD-10-CM | POA: Diagnosis not present

## 2024-04-13 DIAGNOSIS — I4891 Unspecified atrial fibrillation: Secondary | ICD-10-CM | POA: Diagnosis not present

## 2024-04-13 DIAGNOSIS — I4892 Unspecified atrial flutter: Secondary | ICD-10-CM

## 2024-04-13 DIAGNOSIS — I10 Essential (primary) hypertension: Secondary | ICD-10-CM | POA: Diagnosis not present

## 2024-04-13 LAB — URINE DRUG SCREEN, QUALITATIVE (ARMC ONLY)
Amphetamines, Ur Screen: NOT DETECTED
Barbiturates, Ur Screen: NOT DETECTED
Benzodiazepine, Ur Scrn: NOT DETECTED
Cannabinoid 50 Ng, Ur ~~LOC~~: NOT DETECTED
Cocaine Metabolite,Ur ~~LOC~~: NOT DETECTED
MDMA (Ecstasy)Ur Screen: NOT DETECTED
Methadone Scn, Ur: NOT DETECTED
Opiate, Ur Screen: POSITIVE — AB
Phencyclidine (PCP) Ur S: NOT DETECTED
Tricyclic, Ur Screen: POSITIVE — AB

## 2024-04-13 LAB — BASIC METABOLIC PANEL WITH GFR
Anion gap: 9 (ref 5–15)
BUN: 36 mg/dL — ABNORMAL HIGH (ref 8–23)
CO2: 30 mmol/L (ref 22–32)
Calcium: 8.1 mg/dL — ABNORMAL LOW (ref 8.9–10.3)
Chloride: 98 mmol/L (ref 98–111)
Creatinine, Ser: 1.72 mg/dL — ABNORMAL HIGH (ref 0.61–1.24)
GFR, Estimated: 41 mL/min — ABNORMAL LOW (ref >60)
Glucose, Bld: 207 mg/dL — ABNORMAL HIGH (ref 70–99)
Potassium: 4 mmol/L (ref 3.5–5.1)
Sodium: 137 mmol/L (ref 135–145)

## 2024-04-13 LAB — HEPARIN LEVEL (UNFRACTIONATED): Heparin Unfractionated: >1.1 [IU]/mL — ABNORMAL HIGH (ref 0.30–0.70)

## 2024-04-13 LAB — GLUCOSE, CAPILLARY
Glucose-Capillary: 136 mg/dL — ABNORMAL HIGH (ref 70–99)
Glucose-Capillary: 120 mg/dL — ABNORMAL HIGH (ref 70–99)
Glucose-Capillary: 203 mg/dL — ABNORMAL HIGH (ref 70–99)
Glucose-Capillary: 204 mg/dL — ABNORMAL HIGH (ref 70–99)

## 2024-04-13 LAB — CBC
HCT: 38 % — ABNORMAL LOW (ref 39.0–52.0)
Hemoglobin: 12.3 g/dL — ABNORMAL LOW (ref 13.0–17.0)
MCH: 27.5 pg (ref 26.0–34.0)
MCHC: 32.4 g/dL (ref 30.0–36.0)
MCV: 85 fL (ref 80.0–100.0)
Platelets: 103 10*3/uL — ABNORMAL LOW (ref 150–400)
RBC: 4.47 MIL/uL (ref 4.22–5.81)
RDW: 16 % — ABNORMAL HIGH (ref 11.5–15.5)
WBC: 4.4 10*3/uL (ref 4.0–10.5)
nRBC: 0 % (ref 0.0–0.2)

## 2024-04-13 LAB — LIPID PANEL
Cholesterol: 163 mg/dL (ref 0–200)
HDL: 27 mg/dL — ABNORMAL LOW (ref >40)
LDL Cholesterol: 97 mg/dL (ref 0–99)
Total CHOL/HDL Ratio: 6 ratio
Triglycerides: 196 mg/dL — ABNORMAL HIGH (ref <150)
VLDL: 39 mg/dL (ref 0–40)

## 2024-04-13 LAB — TROPONIN I (HIGH SENSITIVITY)
Troponin I (High Sensitivity): 8 ng/L (ref <18)
Troponin I (High Sensitivity): 8 ng/L (ref <18)
Troponin I (High Sensitivity): 7 ng/L (ref <18)
Troponin I (High Sensitivity): 7 ng/L (ref <18)

## 2024-04-13 LAB — APTT
aPTT: 95 s — ABNORMAL HIGH (ref 24–36)
aPTT: 86 s — ABNORMAL HIGH (ref 24–36)

## 2024-04-13 LAB — HEMOGLOBIN A1C
Hgb A1c MFr Bld: 8.8 % — ABNORMAL HIGH (ref 4.8–5.6)
Mean Plasma Glucose: 205.86 mg/dL

## 2024-04-13 LAB — POTASSIUM: Potassium: 4 mmol/L (ref 3.5–5.1)

## 2024-04-13 LAB — CBG MONITORING, ED: Glucose-Capillary: 107 mg/dL — ABNORMAL HIGH (ref 70–99)

## 2024-04-13 MED ORDER — MEDIHONEY WOUND/BURN DRESSING EX PSTE
1.0000 | PASTE | Freq: Every day | CUTANEOUS | Status: DC
  Administered 2024-04-13 – 2024-04-14 (×2): 1 via TOPICAL
  Filled 2024-04-13: qty 44

## 2024-04-13 MED ORDER — ATORVASTATIN CALCIUM 20 MG PO TABS
40.0000 mg | ORAL_TABLET | Freq: Every day | ORAL | Status: DC
  Administered 2024-04-13 – 2024-04-14 (×2): 40 mg via ORAL
  Filled 2024-04-13 (×2): qty 2

## 2024-04-13 MED ORDER — AMIODARONE HCL 200 MG PO TABS
200.0000 mg | ORAL_TABLET | Freq: Every day | ORAL | Status: DC
  Administered 2024-04-14: 200 mg via ORAL
  Filled 2024-04-13: qty 1

## 2024-04-13 MED ORDER — IRBESARTAN 150 MG PO TABS
150.0000 mg | ORAL_TABLET | Freq: Every day | ORAL | Status: DC
  Administered 2024-04-13 – 2024-04-14 (×2): 150 mg via ORAL
  Filled 2024-04-13 (×2): qty 1

## 2024-04-13 MED ORDER — SODIUM CHLORIDE 0.9 % IV BOLUS
500.0000 mL | Freq: Once | INTRAVENOUS | Status: AC
  Administered 2024-04-13: 500 mL via INTRAVENOUS

## 2024-04-13 NOTE — Progress Notes (Signed)
 Pt has scheduled metoprolol  50mg , pt is brady 40's-50's currently 57, also soft BP which we just gave 500 bolus. Notify MD if need to hold medication, order to hold. Also asked if we can renew pt cardiac telemetry. No other concern at the moment. Plan of care continued.

## 2024-04-13 NOTE — Consult Note (Signed)
 PHARMACY - ANTICOAGULATION CONSULT NOTE  Pharmacy Consult for Heparin  Indication: atrial fibrillation  Allergies  Allergen Reactions   Carisoprodol Itching and Nausea And Vomiting    Reaction:  Unknown   Sensitivity   Reaction:  Unknown     Reaction:  Unknown     Sensitivity     Reaction:  Unknown    Patient Measurements: Height: 5' 10 (177.8 cm) Weight: 125.3 kg (276 lb 3.8 oz) IBW/kg (Calculated) : 73 HEPARIN  DW (KG): 101.5  Vital Signs: Temp: 97.8 F (36.6 C) (04/18 0318) Temp Source: Oral (04/18 0318) BP: 127/54 (04/18 0318) Pulse Rate: 54 (04/18 0318)  Labs: Recent Labs    04/12/24 1559 04/12/24 1754 04/13/24 0030 04/13/24 0442  HGB 13.9  --   --  12.3*  HCT 43.0  --   --  38.0*  PLT 127*  --   --  103*  APTT 37*  --   --  95*  LABPROT 15.9*  --   --   --   INR 1.2  --   --   --   HEPARINUNFRC  --   --   --  >1.10*  CREATININE 2.32*  --   --   --   TROPONINIHS 8 8 8   --     Estimated Creatinine Clearance: 37.1 mL/min (A) (by C-G formula based on SCr of 2.32 mg/dL (H)).   Medical History: 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    x2   GERD (gastroesophageal reflux disease)    Headache    h/o as a child   Hypercholesteremia    Hypertension    Kidney failure    acute   Neuromuscular disorder (HCC)    Neuropathy    Pneumonia    time 9 ;last episode 12/2015   Pulmonary embolus (HCC) 2011   Seasonal allergies    Seizures (HCC)    as child    Sleep apnea    BIPAP   Stiff neck    limited turning s/p titanium plate placement   TIA (transient ischemic attack)    Wears dentures    full upper and lower    Pertinent Medications:  Apixaban  5mg   twice dailiy Last dose: morning of 4/17  Assessment: 73 y.o. male with history of DM2, HTN, HLD, atrial flutter, HFpEF, paroxysmal A-fib(on apixaban  PTA) presenting today for chest pain.  Patient was seen by his cardiology team earlier today for ongoing chest pain symptoms.  He was sent into the ED for further evaluation. Plan for possible cardiac cath. Pharmacy has been consulted to initiate and monitor heparin  infusion.  Baseline labs: aPTT 37sec, INR pending, Hgb 13.9, Plts 127(low at baseline  Goal of Therapy:  Heparin  level 0.3-0.7 units/ml aPTT 66-102 seconds Monitor platelets by anticoagulation protocol: Yes  4/18 0442 aPTT 95, therapeutic x 1 / HL >1.1, not correlating   Plan:  Continue heparin  infusion at 1500 units/hr Recheck aPTT in 8 hours to confirm Will use aPTT to guide dosing until both levels correlate, then will transition to HL dosing Continue to monitor HL, H&H, and platelets daily  Rankin CANDIE Dills, PharmD,  MBA 04/13/2024 5:30 AM

## 2024-04-13 NOTE — Consult Note (Signed)
 Cardiology Consult    Patient ID: Jerry Parsons MRN: [id number], DOB/AGE: 27-Oct-1951   Admit date: 04/12/2024 Date of Consult: 04/13/2024  Primary Physician: Bertrum Charlie CROME, MD Primary Cardiologist: Evalene Lunger, MD Requesting Provider: MYRTIS Dart, MD  Patient Profile    Jerry Parsons is a 73 y.o. male with a history of persistent atrial flutter on amiodarone  and Eliquis , diabetes, hypertension, hyperlipidemia, obesity, sleep apnea, stage II-III chronic kidney disease, depression, chronic back pain, spinal stenosis, osteoarthritis, TIA, and peripheral neuropathy, who is being seen today for the evaluation of chest pain at the request of Dr. Dart.  Past Medical History  Subjective  Past Medical History:  Diagnosis Date   Arthritis    Atrial flutter (HCC)    a. 12/2023 s/p DCCV.   Blind right eye    BPH (benign prostatic hyperplasia)    Chronic heart failure with preserved ejection fraction (HFpEF) (HCC)    a. 01/2024 Echo: EF 60-65%, no rwma, nl RV fxn, mildly dil LA, mild MR, AoV sclerosis w/o stenosis.   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    x2   GERD (gastroesophageal reflux disease)    Headache    h/o as a child   Hypercholesteremia    Hypertension    Kidney failure    acute   Neuromuscular disorder (HCC)    Neuropathy    Pneumonia    time 9 ;last episode 12/2015   Pulmonary embolus (HCC) 2011   Seasonal allergies    Seizures (HCC)    as child    Sleep apnea    BIPAP   Stiff neck    limited turning s/p titanium plate placement   TIA (transient ischemic attack)    Wears dentures    full upper and lower    Past Surgical History:  Procedure Laterality Date   BACK SURGERY     x 8; upper x 3 & lower x 5   CARDIOVERSION  03/14/13, 10/16   2014 - [facility], 2016 - [facility]   CARDIOVERSION N/A 09/11/2020    Procedure: CARDIOVERSION;  Surgeon: Lunger Evalene PARAS, MD;  Location: [facility];  Service: Cardiovascular;  Laterality: N/A;   CARDIOVERSION N/A 01/25/2024   Procedure: CARDIOVERSION;  Surgeon: Lunger Evalene PARAS, MD;  Location: [facility];  Service: Cardiovascular;  Laterality: N/A;   CATARACT EXTRACTION W/PHACO Left 10/29/2015   Procedure: CATARACT EXTRACTION PHACO AND INTRAOCULAR LENS PLACEMENT (IOC);  Surgeon: Dene Etienne, MD;  Location: [facility];  Service: Ophthalmology;  Laterality: Left;  DIABETIC - insulin  and oral medsSleep apnea - no machine   CATARACT EXTRACTION W/PHACO Right 02/16/2023   Procedure: CATARACT EXTRACTION PHACO AND INTRAOCULAR LENS PLACEMENT (IOC) RIGHT DIABETIC MALYUGIN HEALON 5 VISION BLUE  65.52  03:57.7;  Surgeon: Etienne Dene, MD;  Location: [facility];  Service: Ophthalmology;  Laterality: Right;  Diabetic   CHOLECYSTECTOMY     COLONOSCOPY WITH PROPOFOL  N/A 01/16/2018   Procedure: COLONOSCOPY WITH PROPOFOL ;  Surgeon: Viktoria Lamar DASEN, MD;  Location: [facility] END[facility];  Service: Endoscopy;  Laterality: N/A;   ESOPHAGOGASTRODUODENOSCOPY (EGD) WITH PROPOFOL  N/A 01/16/2018   Procedure: ESOPHAGOGASTRODUODENOSCOPY (EGD) WITH PROPOFOL ;  Surgeon: Viktoria Lamar DASEN, MD;  Location[facility];  Service: Endoscopy;  Laterality: N/A;   EYE SURGERY     GALLBLADDER SURGERY  2002  JOINT REPLACEMENT Right 2018   KNEE ARTHROSCOPY     left    PARATHYROIDECTOMY N/A 07/25/2020   Procedure: PARATHYROIDECTOMY;  Surgeon: Marolyn Nest, MD;  Location: [facility] ORS;  Service: General;  Laterality: N/A;   ROTATOR CUFF REPAIR  2001   left    TEE WITHOUT CARDIOVERSION N/A 04/26/2018   Procedure: TRANSESOPHAGEAL ECHOCARDIOGRAM (TEE);  Surgeon: Mady Bruckner, MD;  Location: [facility];  Service: Cardiovascular;  Laterality: N/A;   TONSILLECTOMY     TOTAL KNEE ARTHROPLASTY Right 06/20/2017   Procedure: RIGHT TOTAL KNEE ARTHROPLASTY;  Surgeon: Melodi Lerner, MD;   Location: [facility];  Service: Orthopedics;  Laterality: Right;     Allergies  Allergies  Allergen Reactions   Carisoprodol Itching and Nausea And Vomiting    Reaction:  Unknown   Sensitivity   Reaction:  Unknown     Reaction:  Unknown     Sensitivity     Reaction:  Unknown      History of Present Illness   73 y.o. male with a history of persistent atrial flutter on amiodarone  and Eliquis , diabetes, hypertension, hyperlipidemia, obesity, sleep apnea, stage II-III chronic kidney disease, depression, chronic back pain, spinal stenosis, osteoarthritis, TIA, and peripheral neuropathy.  He was initially diagnosed with atrial flutter in March 2014, and subsequently underwent cardioversion. He was hospitalized in June 2021 with recurrent atrial flutter and required cardioversion in September 2021. Echocardiogram at that time showed nl LV fxn and moderately dilated left atrium. He has since been managed with amiodarone  and Eliquis .  In October 2024, he was admitted with respiratory failure and community-acquired pneumonia as well as rate controlled atrial flutter.  Following a course of antibiotics, he was discharged home in atrial flutter and subsequently had a 20 pound weight gain with development of significant lower extremity and scrotal edema.  Outpatient diuretics were adjusted in December 2024 with resultant rise in creatinine necessitating reduction of torsemide  to 20 mg daily.  In the setting of ongoing atrial flutter at office visit in December 2024, amiodarone  was increased to 200 mg twice daily with plan for 2-week follow-up and cardioversion however, patient canceled multiple appointments prior to being seen January 18, 2024.  He subsequently underwent successful cardioversion on January 25, 2024.  He again canceled multiple appointments post cardioversion.  Post cardioversion echo showed normal LV function with mildly dilated left atrium and aortic valve sclerosis.  Jerry Parsons was seen in  clinic on April 17 with complaints of a 24-hour history of severe precordial chest pain.  He was noted to be mildly volume overloaded as well.  He was maintaining sinus rhythm and ECG was without acute ST or T changes..  In the setting of ongoing chest pain, he was referred to the emergency department.  Lab work notable for BUN and creatinine elevated above baseline at 44/2.32.  He was hyperkalemic at 5.7.  H&H normal at 13.9/43.0.  WBC 6.9.  Platelets stable at 127,000.  Despite prolonged chest pain, troponins normal at 8 x 4.  Chest x-ray unremarkable.  CT angiogram of the chest was negative for PE though coronary calcium  noted.  Lower extremity ultrasound showed no evidence of acute DVT.  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  Left Baker's cyst also noted.    This morning, Jerry Parsons continues to complain of mild chest pain noting that it was constant throughout the night.  It is somewhat worse when he reaches his left arm behind his back.  Inpatient Medications  Subjective    [START ON 04/14/2024] amiodarone   200 mg Oral Daily   aspirin  EC  81 mg Oral Daily   atorvastatin   40 mg Oral Daily   cholecalciferol   1,000 Units Oral BID   cyclobenzaprine   10 mg Oral TID   dutasteride   0.5 mg Oral Daily   gabapentin   100 mg Oral BID   gabapentin   300 mg Oral QHS   insulin  aspart  0-5 Units Subcutaneous QHS   insulin  aspart  0-9 Units Subcutaneous TID WC   insulin  glargine-yfgn  30 Units Subcutaneous BID   leptospermum manuka honey  1 Application Topical Daily   magnesium  oxide  400 mg Oral BID   metoprolol  tartrate  50 mg Oral BID   nortriptyline   10 mg Oral QHS   omega-3 acid ethyl esters  1 g Oral BID   oxyCODONE   40 mg Oral Q12H   pantoprazole   20 mg Oral Daily   pregabalin   200 mg Oral TID   rOPINIRole   1 mg Oral BID   tamsulosin   0.4 mg Oral Daily    Family History    Family History  Problem Relation Age of Onset   Heart attack  Brother    He indicated that his mother is deceased. He indicated that his father is deceased. He indicated that his sister is alive. He indicated that his brother is alive.   Social History    Social History   Socioeconomic History   Marital status: Married    Spouse name: Rojelio    Number of children: 1   Years of education: Not on file   Highest education level: 6th grade  Occupational History   Occupation: retired  Tobacco Use   Smoking status: Former    Current packs/day: 0.00    Average packs/day: 2.0 packs/day for 20.0 years (40.0 ttl pk-yrs)    Types: Cigarettes    Start date: 12/26/1969    Quit date: 12/26/1989    Years since quitting: 34.3   Smokeless tobacco: Never  Vaping Use   Vaping status: Never Used  Substance and Sexual Activity   Alcohol use: No   Drug use: No   Sexual activity: Not on file  Other Topics Concern   Not on file  Social History Narrative   Lives at home with wife, Niotaze. 2 Indoor dogs.   Social Drivers of Corporate Investment Banker Strain: Low Risk  (09/03/2023)   Received from [facility]   Overall Financial Resource Strain (CARDIA)    Difficulty of Paying Living Expenses: Not hard at all  Food Insecurity: No Food Insecurity (04/13/2024)   Hunger Vital Sign    Worried About Running Out of Food in the Last Year: Never true    Ran Out of Food in the Last Year: Never true  Transportation Needs: No Transportation Needs (04/13/2024)   PRAPARE - Administrator, Civil Service (Medical): No    Lack of Transportation (Non-Medical): No  Physical Activity: Inactive (10/31/2019)   Exercise Vital Sign    Days of Exercise per Week: 0 days    Minutes of Exercise per Session: 0 min  Stress: No Stress Concern Present (10/31/2019)   [employer] of Occupational Health - Occupational Stress Questionnaire    Feeling of Stress : Not at all  Social Connections: Unknown (04/13/2024)   Social Connection and Isolation  Panel [NHANES]    Frequency of Communication with Friends and Family: More than three  times a week    Frequency of Social Gatherings with Friends and Family: Patient declined    Attends Religious Services: Patient declined    Active Member of Clubs or Organizations: Patient declined    Attends Banker Meetings: Patient declined    Marital Status: Married  Catering Manager Violence: Not At Risk (04/13/2024)   Humiliation, Afraid, Rape, and Kick questionnaire    Fear of Current or Ex-Partner: No    Emotionally Abused: No    Physically Abused: No    Sexually Abused: No     Review of Systems    General:  No chills, fever, night sweats or weight changes.  Cardiovascular:  +++ chest pain, no dyspnea on exertion, +++ lower extremity edema, no orthopnea, palpitations, paroxysmal nocturnal dyspnea. Dermatological: No rash, lesions/masses Respiratory: No cough, dyspnea Urologic: No hematuria, dysuria Abdominal:   No nausea, vomiting, diarrhea, bright red blood per rectum, melena, or hematemesis Neurologic:  No visual changes, wkns, changes in mental status. All other systems reviewed and are otherwise negative except as noted above.    Objective  Physical Exam    Blood pressure (!) 121/57, pulse (!) 54, temperature 97.6 F (36.4 C), resp. rate 16, height 5' 10 (1.778 m), weight 125.3 kg, SpO2 96%.  General: Pleasant, NAD Psych: Normal affect. Neuro: Alert and oriented X 3. Moves all extremities spontaneously. HEENT: Normal  Neck: Supple without bruits or JVD. Lungs:  Resp regular and unlabored, CTA. Heart: RRR no s3, s4, or murmurs. Abdomen: Soft, non-tender, non-distended, BS + x 4.  Extremities: No clubbing, cyanosis.  1+ bilateral lower extremity edema. DP/PT2+, Radials 2+ and equal bilaterally.  Labs    Cardiac Enzymes Recent Labs  Lab 04/12/24 1559 04/12/24 1754 04/13/24 0030 04/13/24 0442  TROPONINIHS 8 8 8 8      BNP    Component Value Date/Time   BNP  32.1 04/12/2024 1559   Lab Results  Component Value Date   WBC 4.4 04/13/2024   HGB 12.3 (L) 04/13/2024   HCT 38.0 (L) 04/13/2024   MCV 85.0 04/13/2024   PLT 103 (L) 04/13/2024    Recent Labs  Lab 04/13/24 0442  NA 137  K 4.0  CL 98  CO2 30  BUN 36*  CREATININE 1.72*  CALCIUM  8.1*  GLUCOSE 207*   Lab Results  Component Value Date   CHOL 163 04/13/2024   HDL 27 (L) 04/13/2024   LDLCALC 97 04/13/2024   TRIG 196 (H) 04/13/2024   Lab Results  Component Value Date   DDIMER 1.69 (H) 04/12/2024      Radiology Studies    US  Venous Img Lower Bilateral (DVT) Result Date: 04/12/2024 CLINICAL DATA:  Positive D-dimer EXAM: BILATERAL LOWER EXTREMITY VENOUS DOPPLER ULTRASOUND TECHNIQUE: Gray-scale sonography with graded compression, as well as color Doppler and duplex ultrasound were performed to evaluate the lower extremity deep venous systems from the level of the common femoral vein and including the common femoral, femoral, profunda femoral, popliteal and calf veins including the posterior tibial, peroneal and gastrocnemius veins when visible. The superficial great saphenous vein was also interrogated. Spectral Doppler was utilized to evaluate flow at rest and with distal augmentation maneuvers in the common femoral, femoral and popliteal veins. COMPARISON:  None Available. FINDINGS: RIGHT LOWER EXTREMITY Common Femoral Vein: No evidence of thrombus. Normal compressibility, respiratory phasicity and response to augmentation. Saphenofemoral Junction: No evidence of thrombus. Normal compressibility and flow on color Doppler imaging. Profunda Femoral Vein: No evidence of thrombus. Normal compressibility  and flow on color Doppler imaging. Femoral Vein: No evidence of thrombus. Normal compressibility, respiratory phasicity and response to augmentation. Popliteal Vein: No evidence of thrombus. Normal compressibility, respiratory phasicity and response to augmentation. Calf Veins: No evidence of  thrombus. Normal compressibility and flow on color Doppler imaging. Superficial Great Saphenous Vein: No evidence of thrombus. Normal compressibility. Venous Reflux:  None. Other Findings:  None. LEFT LOWER EXTREMITY Common Femoral Vein: No evidence of thrombus. Normal compressibility, respiratory phasicity and response to augmentation. Saphenofemoral Junction: No evidence of thrombus. Normal compressibility and flow on color Doppler imaging. Profunda Femoral Vein: No evidence of thrombus. Normal compressibility and flow on color Doppler imaging. Femoral Vein: No evidence of thrombus. Normal compressibility, respiratory phasicity and response to augmentation. Popliteal Vein: Echogenic nonocclusive adherent clot noted in the distal popliteal vein, likely chronic nonocclusive clot. Calf Veins: No evidence of thrombus. Normal compressibility and flow on color Doppler imaging. Superficial Great Saphenous Vein: No evidence of thrombus. Normal compressibility. Venous Reflux:  None. Other Findings:  Popliteal fossa cyst measures 5.0 x 3.3 x 2.0 cm. IMPRESSION: No evidence of acute DVT in the lower extremities. 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 Left Baker's cyst. Electronically Signed   By: Franky Crease M.D.   On: 04/12/2024 23:44   CT Angio Chest PE W and/or Wo Contrast Result Date: 04/12/2024 CLINICAL DATA:  Left-sided chest pain, pleuritic chest pain, hypoxia EXAM: CT ANGIOGRAPHY CHEST WITH CONTRAST TECHNIQUE: Multidetector CT imaging of the chest was performed using the standard protocol during bolus administration of intravenous contrast. Multiplanar CT image reconstructions and MIPs were obtained to evaluate the vascular anatomy. RADIATION DOSE REDUCTION: This exam was performed according to the departmental dose-optimization program which includes automated exposure control, adjustment of the mA and/or kV according to patient size and/or use of iterative  reconstruction technique. CONTRAST:  75mL OMNIPAQUE  IOHEXOL  350 MG/ML SOLN COMPARISON:  10/13/2023 FINDINGS: Cardiovascular: There is adequate opacification of the pulmonary arterial tree. There is no intraluminal filling defect identified to suggest acute pulmonary embolism. The central pulmonary arteries are enlarged in keeping with changes of pulmonary arterial hypertension. Moderate multi-vessel coronary artery calcification. Calcification of the aortic leaflets noted. Global cardiac size is within normal limits. No pericardial effusion. Mild atherosclerotic calcification within the thoracic aorta. No aortic aneurysm. Mediastinum/Nodes: No enlarged mediastinal, hilar, or axillary lymph nodes. Thyroid  gland, trachea, and esophagus demonstrate no significant findings. Lungs/Pleura: There is diffuse bronchial wall thickening in keeping with airway inflammation. Sparse scattered ground-glass opacity within lung bases bilaterally likely reflect mild atelectasis. No confluent pulmonary infiltrate. No pneumothorax pleural effusion. Upper Abdomen: There is moderate splenomegaly with the spleen measuring at least 15 cm in greatest dimension, incompletely included on this examination. No acute abnormality. Cholecystectomy has been performed. Musculoskeletal: No acute bone abnormality. No lytic or blastic bone lesion. Osseous structures are age appropriate. C6-7 cervical fusion with instrumentation has been performed. Review of the MIP images confirms the above findings. IMPRESSION: 1. No pulmonary embolism. 2. Diffuse bronchial wall thickening in keeping with airway inflammation. 3. Moderate multi-vessel coronary artery calcification. 4. Morphologic changes in keeping with pulmonary arterial hypertension 5. Moderate splenomegaly, incompletely included on this examination. Aortic Atherosclerosis (ICD10-I70.0). Electronically Signed   By: Dorethia Molt M.D.   On: 04/12/2024 20:13   DG Chest 2 View Result Date:  04/12/2024 CLINICAL DATA:  Chest pain. EXAM: CHEST - 2 VIEW COMPARISON:  10/13/2023. FINDINGS: Bilateral lung fields are clear. Bilateral costophrenic angles are  clear. Normal cardio-mediastinal silhouette. No acute osseous abnormalities. The soft tissues are within normal limits. IMPRESSION: No active cardiopulmonary disease. Electronically Signed   By: Ree Molt M.D.   On: 04/12/2024 16:15      ECG & Cardiac Imaging    April 17 at 1509:  Sinus rhythm, 58, no acute ST or T changes- personally reviewed.  Assessment & Plan    1.  Precordial pain/coronary calcium : Patient presented April 17 with a 24-hour history of constant chest pain worse with internal rotation and abduction of the left shoulder/arm.  ECG unremarkable.  Despite prolonged symptoms, troponins normal.  CT of the chest was negative for PE, though he does have significant coronary calcifications.  He has not previously undergone ischemic evaluation.  He reports 2/10 chest pain this morning.  Will arrange for Lexiscan  Myoview  to rule out ischemia.  Provided that this is normal, he could likely be managed for musculoskeletal pain - avoid NSAIDS in the setting of CKD.  2.  Persistent atrial flutter: Status post successful cardioversion on January 29.  He remains on amiodarone  200 mg twice daily, though we recommended reduction to 10 mg daily yesterday and we will change this today.  Eliquis  is currently on hold pending ischemic evaluation.  He is now on heparin .  Provided that stress testing is normal, would like to discontinue heparin  and resume Eliquis  5 mg twice daily.  Continue metoprolol .  Of note, he was not taking at home following his cardioversion.  Heart rates have been stable here.  3.  Primary hypertension: Stable on beta-blocker therapy.  Home doses of telmisartan  and spironolactone are currently on hold in the setting of hyperkalemia on presentation.  Potassium is normal at 4.0 this morning.  Will look to resume  telmisartan .  4.  Hyperlipidemia: LDL of 97 this morning.  Continue atorvastatin  therapy.  5.  Acute on chronic stage III kidney disease: BUN and creatinine elevated above baseline of 44/2.32 on admission.  He had been taking torsemide  20 mg daily at home.  We previously had reduced dosing to as needed in the setting of rising creatinine in December 2024.  Torsemide  and spironolactone currently on hold.  Look to resume ARB.  6.  Hyperkalemia: Potassium 5.7 on admission.  Normal this morning.  Spironolactone on hold.  Resume ARB and follow-up potassium in the morning.  7.  Chronic HFpEF: Chronic, mild lower extremity edema.  He is lying relatively flat this morning and does not appear to be significantly volume overloaded.  Heart rate and blood pressure stable.  As above, he was taking torsemide  20 mg daily at home.  This is currently on hold and he will likely need to use on an as-needed basis while implementing conservative approaches to managing chronic lower extremity edema such as compression.  Risk Assessment/Risk Scores:     TIMI Risk Score for Unstable Angina or Non-ST Elevation MI:   The patient's TIMI risk score is 3, which indicates a 13% risk of all cause mortality, new or recurrent myocardial infarction or need for urgent revascularization in the next 14 days.      Signed, Lonni Meager, NP 04/13/2024, 10:52 AM  For questions or updates, please contact   Please consult [email or web address] for contact info under Cardiology/STEMI.

## 2024-04-13 NOTE — Consult Note (Signed)
 WOC Nurse Consult Note: Reason for Consult: R buttock/R shin wounds  Wound type: 1. Deep Tissue Pressure Injury sacrum/R glute that has evolved to Unstageable  2.  Full thickness wound R shin  Pressure Injury POA: Yes Measurement: see nursing flowsheet  Wound bed: 1.  Sacrum/R buttock Purple maroon discoloration that has opened to black eschar  2.  R lower leg 2 wounds brown yellow necrotic tissue  Drainage (amount, consistency, odor) see nursing flowsheet  Periwound: dry scaly skin R leg  Dressing procedure/placement/frequency:  Cleanse sacral/R glute wound with Vashe wound cleanser Timm Foot [phone]), do not rinse and allow to air dry. Apply Medihoney to wound bed daily, using a Q tip applicator fill wound with dry gauze, cover with ABD pad or silicone foam whichever is preferred.  Cleanse R lower leg wounds with Vashe wound cleanser Timm Foot [phone]), do not rinse and allow to air dry. Apply Medihoney to wounds daily, cover with dry gauze and silicone foam.   POC discussed with bedside nurse. Patient would benefit from a low air loss mattress for pressure redistribution and moisture management.   WOC team will not follow. Re-consult if further needs arise.   Thank you,    Ronni Colace MSN, RN-BC, [employer] [phone]

## 2024-04-13 NOTE — Consult Note (Signed)
 PHARMACY - ANTICOAGULATION CONSULT NOTE  Pharmacy Consult for Heparin  Indication: atrial fibrillation  Allergies  Allergen Reactions   Carisoprodol Itching and Nausea And Vomiting    Reaction:  Unknown   Sensitivity   Reaction:  Unknown     Reaction:  Unknown     Sensitivity     Reaction:  Unknown    Patient Measurements: Height: 5' 10 (177.8 cm) Weight: 125.3 kg (276 lb 3.8 oz) IBW/kg (Calculated) : 73 HEPARIN  DW (KG): 101.5  Vital Signs: Temp: 97.8 F (36.6 C) (04/18 1058) BP: 99/52 (04/18 1058) Pulse Rate: 47 (04/18 1058)  Labs: Recent Labs    04/12/24 1559 04/12/24 1754 04/13/24 0030 04/13/24 0442 04/13/24 1302  HGB 13.9  --   --  12.3*  --   HCT 43.0  --   --  38.0*  --   PLT 127*  --   --  103*  --   APTT 37*  --   --  95* 86*  LABPROT 15.9*  --   --   --   --   INR 1.2  --   --   --   --   HEPARINUNFRC  --   --   --  >1.10*  --   CREATININE 2.32*  --   --  1.72*  --   TROPONINIHS 8 8 8 8   --     Estimated Creatinine Clearance: 50 mL/min (A) (by C-G formula based on SCr of 1.72 mg/dL (H)).   Medical History: Past Medical History:  Diagnosis Date   Arthritis    Atrial flutter (HCC)    a. 12/2023 s/p DCCV.   Blind right eye    BPH (benign prostatic hyperplasia)    Chronic heart failure with preserved ejection fraction (HFpEF) (HCC)    a. 01/2024 Echo: EF 60-65%, no rwma, nl RV fxn, mildly dil LA, mild MR, AoV sclerosis w/o stenosis.   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    x2   GERD (gastroesophageal reflux disease)    Headache    h/o as a child   Hypercholesteremia    Hypertension    Kidney failure    acute   Neuromuscular disorder (HCC)    Neuropathy    Pneumonia    time 76 ;last episode 12/2015   Pulmonary embolus (HCC) 2011   Seasonal allergies    Seizures (HCC)    as child     Sleep apnea    BIPAP   Stiff neck    limited turning s/p titanium plate placement   TIA (transient ischemic attack)    Wears dentures    full upper and lower    Pertinent Medications:  Apixaban  5mg  twice dailiy Last dose: morning of 4/17  Assessment: 73 y.o. male with history of DM2, HTN, HLD, atrial flutter, HFpEF, paroxysmal A-fib(on apixaban  PTA) presenting today for chest pain.  Patient was seen by his cardiology team earlier today for ongoing chest pain symptoms.  He was sent into the ED for further evaluation. Plan for possible cardiac cath. Pharmacy has been consulted to initiate and monitor heparin  infusion.  4/18 1302 aPTT 86   Goal of Therapy:  Heparin  level 0.3-0.7 units/ml aPTT 66-102 seconds Monitor platelets by anticoagulation protocol: Yes     Plan:  aPTT is therapeutic. Continue heparin   infusion at 1500 units/hr. Recheck aPTT in 8 hours. CBC and heparin  level daily while on heparin  level.   Cathaleen Blanch, PharmD,  04/13/2024 3:27 PM

## 2024-04-13 NOTE — Plan of Care (Signed)
  Problem: Safety: Goal: Ability to remain free from injury will improve Outcome: Progressing   Problem: Pain Managment: Goal: General experience of comfort will improve and/or be controlled Outcome: Progressing   Problem: Clinical Measurements: Goal: Ability to maintain clinical measurements within normal limits will improve Outcome: Progressing Goal: Will remain free from infection Outcome: Progressing Goal: Respiratory complications will improve Outcome: Progressing Goal: Cardiovascular complication will be avoided Outcome: Progressing

## 2024-04-13 NOTE — Progress Notes (Signed)
 Progress Note   Patient: Jerry Parsons ZOX:[id number] DOB: 04/16/1951 DOA: 04/12/2024     0 DOS: the patient was seen and examined on 04/13/2024   Brief hospital course: From H&P note :Jerry Parsons is a 73 y.o. male with medical history significant of HTN, HLD, DM, dCHF, TIA, depression, BPH, CKD-3A, A-fib and PE/DVT on chronic Eliquis  therapy, thrombocytopenia, chronic pain, right eye blindness, obesity, former smoker, who presents with chest pain.  Onset of symptoms 2 days prior to presentation.  Described pain as throbbing/stabbing.  He was seen in cardiology office yesterday for evaluation for same and was referred to the ED for admission.  CT of the chest ruled out PE.  CT however revealed coronary artery calcification and diffuse bronchial wall thickening.  EKG revealed sinus bradycardia with incomplete right bundle branch block.  Due to continued chest pain symptoms, patient is being considered for pharmacologic myocardial perfusion stress test.  Unfortunately patient ate some crackers this afternoon prior to procedure.  Procedure will have to be deferred till tomorrow.  Assessment and Plan: Acute chest pain: Symptoms likely to be atypical.  Point tenderness on left side of chest.  However considering patient's multiple comorbidities, patient has been scheduled for pharmacologic stress test. -Cardiac markers on admission remain negative and flat. -CT of the chest showed significant coronary calcifications.  Acute PE ruled out - As needed nitroglycerin , acetaminophen  for chest pain symptoms. - Continue statins and aspirin . - Patient is planned for Lexiscan  stress test in AM.  Cardiac cath, not at this time due to risk of worsening renal function.  Persistent atrial flutter: Patient is currently on amiodarone  200 mg daily.  Will continue with same during course of stay.  Eliquis  was held in anticipation for procedure. Currently on heparin  protocol.  Chronic essential hypertension: Patient  is on beta-blocker. Telmisartan and Aldactone were held on admission due to hyperkalemia.  Hyperkalemia: Resolved - Serum potassium today was 4.0.  Hyperlipidemia: Patient is on atorvastatin .  Will continue with same.  Acute on chronic kidney disease: Present on admission. Nephrotoxic medications were held with improvement of renal function.  Serum creatinine is 1.72 today.  Chronic diastolic CHF/HFpEF. -Looks adequately compensated. -Torsemide  was held due to worsening renal function. -No evidence of any fluid overload at this time.  Chronic thrombocytopenia:: Platelet: 103, slight drop from 127. Patient is currently on heparin .  Will closely monitor platelet count on heparin . Consider workup for HIT if platelet count continue to trend down.  History of DVT and history of pulm emboli: Patient chronically on Eliquis  at baseline. On this admission, patient is on IV heparin .  Obstructive sleep apnea - CPAP at night was advised  Severe obesity: Due to excess caloric intake.     Subjective: Patient is doing clinically well.  Denies any chest pain or palpitation.  Denies any nausea or vomiting.  Physical Exam: Vitals:   04/13/24 0200 04/13/24 0318 04/13/24 0742 04/13/24 1058  BP: (!) 100/46 (!) 127/54 (!) 121/57 (!) 99/52  Pulse: (!) 53 (!) 54 (!) 54 (!) 47  Resp: 17 20 16 16   Temp:  97.8 F (36.6 C) 97.6 F (36.4 C) 97.8 F (36.6 C)  TempSrc:  Oral    SpO2: 90% 94% 96% 93%  Weight:  125.3 kg    Height:  5\' 10"  (1.778 m)     General: Patient was seen laying comfortably in bed.  Not in any acute distress.  He has supplemental oxygen  flowing. HEENT: Oral mucosa moist, anicteric, not  pale. Chest: Significant for diminished breath sounds bilaterally.  No added sounds however. Abdomen: Rotund soft nontender with no organomegaly. Extremities with trace edema CNS shows no focal deficits. Psych: Mood stable Data Reviewed:  Sodium is 137, potassium 4.0, bicarb 30, BUN 36,  creatinine 1.7, troponin 8, total cholesterol is 163, WBCs 4.4, hemoglobin 12.3, hematocrit 38, platelet count 103  Family Communication: Patient was seen and evaluated at bedside.  No family at bedside.  Disposition: Status is: Observation The patient remains OBS appropriate and will d/c before 2 midnights.  Planned Discharge Destination: Home    Time spent: 30 minutes  Author: Theodora Fish, MD 04/13/2024 12:52 PM  For on call review [email or web address].

## 2024-04-13 NOTE — Progress Notes (Signed)
 Transition of Care [facility]) - Inpatient Brief Assessment   Patient Details  Name: Jerry Parsons MRN: [id number] Date of Birth: 08-07-1951  [facility] CM/SW Contact:    Charisa Twitty C Shylee Durrett, RN Phone Number: 04/13/2024, 10:24 AM   Clinical Narrative: TOC continuing to follow patient's progress throughout discharge planning.   Transition of Care Asessment: Insurance and Status: Insurance coverage has been reviewed Patient has primary care physician: Yes   Prior level of function:: Independent Prior/Current Home Services: No current home services Social Drivers of Health Review: SDOH reviewed no interventions necessary Readmission risk has been reviewed: Yes Transition of care needs: no transition of care needs at this time

## 2024-04-13 NOTE — Progress Notes (Signed)
 pt is c/o chest pain 7/10 sharp pain, radiating on his left shoulder. No dizziness or light headedness. pt came in for chest pain. He had a plan stress test today but got cancelled because he ate some breakfast today. He has soft BP of 99/50 (64), I was going to give him nitro but with the BP holding it for now? He also have morphine  PRN. Telemetry shows sinus brady. He has heparin  drip infusing now. Pt denies dizziness or light headedness. Notify provider if pt needs EKG, troponin? Order place and to also give 500 bolus.

## 2024-04-13 NOTE — Progress Notes (Signed)
   Significant event: Chest pain with bradycardia and hypotension    CROSS COVER NOTE  NAME: BRIXON ZHEN MRN: [id number] DOB : 05-Feb-1951    Concern as stated by nurse / staff   pt is c/o chest pain 7/10 sharp pain, radiating on his left shoulder. No dizziness or light headedness. pt came in for chest pain. he had a plan stress test today but got cancelled because he ate some breakfast. His has soft BP of 99/50 (64), I was going to give him nitro but with the BP holding it for now? he also have morphine  PRN. He has heparin  drip infusing now. Do you want an EKG or troponin? in our telemetry he looks like his sinus brady      Pertinent findings on chart review: Last progress note reviewed: Briefly, patient admitted with acute chest pain, being worked up for ACS.  Also has persistent a flutter on amiodarone  Currently on heparin  awaiting cardiac cath   Patient assessment Patient seen and examined.  He states the chest pain has resolved.  Denies complaints at this time    04/13/2024    8:50 PM 04/13/2024    7:22 PM 04/13/2024    4:39 PM  Vitals with BMI  Systolic 119 99 98  Diastolic 50 50 54  Pulse 52 51 48   Physical Exam Vitals and nursing note reviewed.  Constitutional:      General: He is not in acute distress. HENT:     Head: Normocephalic and atraumatic.  Cardiovascular:     Rate and Rhythm: Normal rate and regular rhythm.     Heart sounds: Normal heart sounds.  Pulmonary:     Effort: Pulmonary effort is normal.     Breath sounds: Normal breath sounds.  Abdominal:     Palpations: Abdomen is soft.     Tenderness: There is no abdominal tenderness.  Neurological:     Mental Status: Mental status is at baseline.       Workup  Cardiac Panel (last 3 results) Recent Labs    04/13/24 0442 04/13/24 2022 04/13/24 2125  TROPONINIHS 8 7 7       Assessment and  Interventions and reassessment   Assessment:  Hypotension Sinus bradycardia Chest pain--troponin flat  and EKG without ischemia  Plan: Small NS bolus was ordered with improvement in BP as seen on trend above Hold metoprolol  tonight Continue current management for ACS with heparin  infusion Morphine  and NTG for chest pain as BP would tolerate   Reassessment at 5 AM    04/14/2024    4:36 AM 04/13/2024   11:54 PM 04/13/2024   10:34 PM  Vitals with BMI  Systolic 113 124   Diastolic 76 59   Pulse 59 59 57       CRITICAL CARE Performed by: Lanetta Pion   Total critical care time: 55 minutes  Critical care time was exclusive of separately billable procedures and treating other patients.  Critical care was necessary to treat or prevent imminent or life-threatening deterioration.  Critical care was time spent personally by me on the following activities: development of treatment plan with patient and/or surrogate as well as nursing, discussions with consultants, evaluation of patient's response to treatment, examination of patient, obtaining history from patient or surrogate, ordering and performing treatments and interventions, ordering and review of laboratory studies, ordering and review of radiographic studies, pulse oximetry and re-evaluation of patient's condition.

## 2024-04-14 ENCOUNTER — Observation Stay

## 2024-04-14 DIAGNOSIS — I25118 Atherosclerotic heart disease of native coronary artery with other forms of angina pectoris: Secondary | ICD-10-CM

## 2024-04-14 DIAGNOSIS — N189 Chronic kidney disease, unspecified: Secondary | ICD-10-CM

## 2024-04-14 DIAGNOSIS — I5032 Chronic diastolic (congestive) heart failure: Secondary | ICD-10-CM | POA: Diagnosis not present

## 2024-04-14 DIAGNOSIS — R079 Chest pain, unspecified: Secondary | ICD-10-CM | POA: Diagnosis not present

## 2024-04-14 DIAGNOSIS — E875 Hyperkalemia: Secondary | ICD-10-CM | POA: Diagnosis not present

## 2024-04-14 DIAGNOSIS — R0789 Other chest pain: Secondary | ICD-10-CM | POA: Diagnosis not present

## 2024-04-14 DIAGNOSIS — I480 Paroxysmal atrial fibrillation: Secondary | ICD-10-CM | POA: Diagnosis not present

## 2024-04-14 DIAGNOSIS — I200 Unstable angina: Principal | ICD-10-CM

## 2024-04-14 DIAGNOSIS — I4892 Unspecified atrial flutter: Secondary | ICD-10-CM | POA: Diagnosis not present

## 2024-04-14 LAB — CBC
HCT: 38.4 % — ABNORMAL LOW (ref 39.0–52.0)
Hemoglobin: 12 g/dL — ABNORMAL LOW (ref 13.0–17.0)
MCH: 27 pg (ref 26.0–34.0)
MCHC: 31.3 g/dL (ref 30.0–36.0)
MCV: 86.5 fL (ref 80.0–100.0)
Platelets: 87 10*3/uL — ABNORMAL LOW (ref 150–400)
RBC: 4.44 MIL/uL (ref 4.22–5.81)
RDW: 16.2 % — ABNORMAL HIGH (ref 11.5–15.5)
WBC: 5.1 10*3/uL (ref 4.0–10.5)
nRBC: 0 % (ref 0.0–0.2)

## 2024-04-14 LAB — NM MYOCAR MULTI W/SPECT W/WALL MOTION / EF
Estimated workload: 1
Exercise duration (min): 0 min
Exercise duration (sec): 0 s
LV dias vol: 124 mL (ref 62–150)
LV sys vol: 30 mL
MPHR: 146 {beats}/min
Nuc Stress EF: 76 %
Peak HR: 74 {beats}/min
Percent HR: 50 %
Rest HR: 62 {beats}/min
Rest Nuclear Isotope Dose: 10.7 mCi
SDS: 1
SRS: 13
SSS: 6
ST Depression (mm): 0 mm
Stress Nuclear Isotope Dose: 31.2 mCi
TID: 1.04

## 2024-04-14 LAB — BASIC METABOLIC PANEL WITH GFR
Anion gap: 7 (ref 5–15)
BUN: 28 mg/dL — ABNORMAL HIGH (ref 8–23)
CO2: 31 mmol/L (ref 22–32)
Calcium: 8 mg/dL — ABNORMAL LOW (ref 8.9–10.3)
Chloride: 99 mmol/L (ref 98–111)
Creatinine, Ser: 1.74 mg/dL — ABNORMAL HIGH (ref 0.61–1.24)
GFR, Estimated: 41 mL/min — ABNORMAL LOW (ref >60)
Glucose, Bld: 149 mg/dL — ABNORMAL HIGH (ref 70–99)
Potassium: 4.4 mmol/L (ref 3.5–5.1)
Sodium: 137 mmol/L (ref 135–145)

## 2024-04-14 LAB — GLUCOSE, CAPILLARY
Glucose-Capillary: 122 mg/dL — ABNORMAL HIGH (ref 70–99)
Glucose-Capillary: 127 mg/dL — ABNORMAL HIGH (ref 70–99)

## 2024-04-14 LAB — APTT: aPTT: 104 s — ABNORMAL HIGH (ref 24–36)

## 2024-04-14 LAB — HEPARIN LEVEL (UNFRACTIONATED): Heparin Unfractionated: 1.08 [IU]/mL — ABNORMAL HIGH (ref 0.30–0.70)

## 2024-04-14 MED ORDER — TECHNETIUM TC 99M TETROFOSMIN IV KIT
10.6600 | PACK | Freq: Once | INTRAVENOUS | Status: AC | PRN
  Administered 2024-04-14: 10.66 via INTRAVENOUS

## 2024-04-14 MED ORDER — TORSEMIDE 20 MG PO TABS: 20.0000 mg | ORAL_TABLET | ORAL | Status: AC

## 2024-04-14 MED ORDER — ATORVASTATIN CALCIUM 40 MG PO TABS: 40.0000 mg | ORAL_TABLET | Freq: Every day | ORAL | 2 refills | Status: DC

## 2024-04-14 MED ORDER — ASPIRIN 81 MG PO TBEC: 81.0000 mg | DELAYED_RELEASE_TABLET | Freq: Every day | ORAL | 12 refills | Status: DC

## 2024-04-14 MED ORDER — APIXABAN 5 MG PO TABS
5.0000 mg | ORAL_TABLET | Freq: Two times a day (BID) | ORAL | Status: DC
  Administered 2024-04-14: 5 mg via ORAL
  Filled 2024-04-14: qty 1

## 2024-04-14 MED ORDER — ATORVASTATIN CALCIUM 40 MG PO TABS: 40.0000 mg | ORAL_TABLET | Freq: Every day | ORAL | 2 refills | Status: AC

## 2024-04-14 MED ORDER — REGADENOSON 0.4 MG/5ML IV SOLN
0.4000 mg | Freq: Once | INTRAVENOUS | Status: AC
  Administered 2024-04-14: 0.4 mg via INTRAVENOUS

## 2024-04-14 MED ORDER — TECHNETIUM TC 99M TETROFOSMIN IV KIT
31.2100 | PACK | Freq: Once | INTRAVENOUS | Status: AC | PRN
  Administered 2024-04-14: 31.21 via INTRAVENOUS

## 2024-04-14 NOTE — Plan of Care (Signed)

## 2024-04-14 NOTE — Consult Note (Signed)
 PHARMACY - ANTICOAGULATION CONSULT NOTE  Pharmacy Consult for Heparin  Indication: atrial fibrillation  Allergies  Allergen Reactions   Carisoprodol Itching and Nausea And Vomiting    Reaction:  Unknown   Sensitivity   Reaction:  Unknown     Reaction:  Unknown     Sensitivity     Reaction:  Unknown    Patient Measurements: Height: 5\' 10"  (177.8 cm) Weight: 125.3 kg (276 lb 3.8 oz) IBW/kg (Calculated) : 73 HEPARIN  DW (KG): 101.5  Vital Signs: Temp: 98.2 F (36.8 C) (04/19 0436) Temp Source: Oral (04/19 0436) BP: 113/76 (04/19 0436) Pulse Rate: 59 (04/19 0436)  Labs: Recent Labs    04/12/24 1559 04/12/24 1754 04/13/24 0442 04/13/24 1302 04/13/24 2022 04/13/24 2125 04/14/24 0436  HGB 13.9  --  12.3*  --   --   --   --   HCT 43.0  --  38.0*  --   --   --   --   PLT 127*  --  103*  --   --   --   --   APTT 37*  --  95* 86*  --   --  104*  LABPROT 15.9*  --   --   --   --   --   --   INR 1.2  --   --   --   --   --   --   HEPARINUNFRC  --   --  >1.10*  --   --   --  1.08*  CREATININE 2.32*  --  1.72*  --   --   --  1.74*  TROPONINIHS 8   < > 8  --  7 7  --    < > = values in this interval not displayed.    Estimated Creatinine Clearance: 49.5 mL/min (A) (by C-G formula based on SCr of 1.74 mg/dL (H)).   Medical History: Past Medical History:  Diagnosis Date   Arthritis    Atrial flutter (HCC)    a. 12/2023 s/p DCCV.   Blind right eye    BPH (benign prostatic hyperplasia)    Chronic heart failure with preserved ejection fraction (HFpEF) (HCC)    a. 01/2024 Echo: EF 60-65%, no rwma, nl RV fxn, mildly dil LA, mild MR, AoV sclerosis w/o stenosis.   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    x2   GERD (gastroesophageal reflux disease)    Headache    h/o as a child   Hypercholesteremia    Hypertension     Kidney failure    acute   Neuromuscular disorder (HCC)    Neuropathy    Pneumonia    time 64 ;last episode 12/2015   Pulmonary embolus (HCC) 2011   Seasonal allergies    Seizures (HCC)    as child    Sleep apnea    BIPAP   Stiff neck    limited turning s/p titanium plate placement   TIA (transient ischemic attack)    Wears dentures    full upper and lower    Pertinent Medications:  Apixaban  5mg  twice dailiy Last dose: morning of 4/17  Assessment: 73 y.o. male with history of DM2, HTN, HLD, atrial flutter, HFpEF, paroxysmal A-fib(on apixaban  PTA) presenting today for chest pain.  Patient was seen by his  cardiology team earlier today for ongoing chest pain symptoms.  He was sent into the ED for further evaluation. Plan for possible cardiac cath. Pharmacy has been consulted to initiate and monitor heparin  infusion.  4/18 1302 aPTT 86 4/19 0436 aPTT 104,  supratherapeutic / HL 1.08, not correlating  Goal of Therapy:  Heparin  level 0.3-0.7 units/ml aPTT 66-102 seconds Monitor platelets by anticoagulation protocol: Yes   Plan:  Decrease heparin  infusion to 1400 units/hr. Recheck aPTT in 8 hours. CBC and heparin  level daily while on heparin  level.   Coretta Dexter, PharmD, [facility] 04/14/2024 6:05 AM

## 2024-04-14 NOTE — Progress Notes (Signed)
 Rounding Note    Patient Name: Jerry Parsons Date of Encounter: 04/14/2024  [facility] Cardiologist: Belva Boyden, MD   Subjective   Resting comfortably supine in bed Back pain Denies significant chest pain Myoview  this morning, low risk, no ischemia  Inpatient Medications    Scheduled Meds:  amiodarone   200 mg Oral Daily   aspirin  EC  81 mg Oral Daily   atorvastatin   40 mg Oral Daily   cholecalciferol   1,000 Units Oral BID   cyclobenzaprine   10 mg Oral TID   dutasteride   0.5 mg Oral Daily   gabapentin   100 mg Oral BID   gabapentin   300 mg Oral QHS   insulin  aspart  0-5 Units Subcutaneous QHS   insulin  aspart  0-9 Units Subcutaneous TID WC   insulin  glargine-yfgn  30 Units Subcutaneous BID   irbesartan   150 mg Oral Daily   leptospermum manuka honey  1 Application Topical Daily   magnesium  oxide  400 mg Oral BID   metoprolol  tartrate  50 mg Oral BID   nortriptyline   10 mg Oral QHS   omega-3 acid ethyl esters  1 g Oral BID   oxyCODONE   40 mg Oral Q12H   pantoprazole   20 mg Oral Daily   pregabalin   200 mg Oral TID   rOPINIRole   1 mg Oral BID   tamsulosin   0.4 mg Oral Daily   Continuous Infusions:  heparin  1,400 Units/hr (04/14/24 0612)   PRN Meds: acetaminophen , hydrALAZINE , loratadine , morphine  injection, nitroGLYCERIN , ondansetron  (ZOFRAN ) IV, oxyCODONE -acetaminophen , rOPINIRole    Vital Signs    Vitals:   04/13/24 2354 04/14/24 0436 04/14/24 1025 04/14/24 1137  BP: (!) 124/59 113/76 (!) 116/50 (!) 123/49  Pulse: (!) 59 (!) 59 63 61  Resp: 18 20  18   Temp: 98 F (36.7 C) 98.2 F (36.8 C) 98.3 F (36.8 C) 98.4 F (36.9 C)  TempSrc:  Oral  Oral  SpO2: 93% 94% 96% 99%  Weight:      Height:        Intake/Output Summary (Last 24 hours) at 04/14/2024 1301 Last data filed at 04/14/2024 1111 Gross per 24 hour  Intake 403.22 ml  Output 2650 ml  Net -2246.78 ml      04/13/2024    3:18 AM 04/12/2024    3:56 PM 04/12/2024    3:05 PM  Last 3  Weights  Weight (lbs) 276 lb 3.8 oz 269 lb 10 oz 269 lb 9.6 oz  Weight (kg) 125.3 kg 122.3 kg 122.29 kg      Telemetry    Normal sinus rhythm- Personally Reviewed  ECG     - Personally Reviewed  Physical Exam   GEN: No acute distress.   Neck: No JVD Cardiac: RRR, no murmurs, rubs, or gallops.  Respiratory: Clear to auscultation bilaterally. GI: Soft, nontender, non-distended  MS: No edema; No deformity. Neuro:  Nonfocal  Psych: Normal affect   Labs    High Sensitivity Troponin:   Recent Labs  Lab 04/12/24 1754 04/13/24 0030 04/13/24 0442 04/13/24 2022 04/13/24 2125  TROPONINIHS 8 8 8 7 7      Chemistry Recent Labs  Lab 04/12/24 1559 04/13/24 0030 04/13/24 0442 04/14/24 0436  NA 136  --  137 137  K 5.7* 4.0 4.0 4.4  CL 95*  --  98 99  CO2 32  --  30 31  GLUCOSE 126*  --  207* 149*  BUN 44*  --  36* 28*  CREATININE 2.32*  --  1.72* 1.74*  CALCIUM  8.9  --  8.1* 8.0*  GFRNONAA 29*  --  41* 41*  ANIONGAP 9  --  9 7    Lipids  Recent Labs  Lab 04/13/24 0442  CHOL 163  TRIG 196*  HDL 27*  LDLCALC 97  CHOLHDL 6.0    Hematology Recent Labs  Lab 04/12/24 1559 04/13/24 0442 04/14/24 0436  WBC 6.9 4.4 5.1  RBC 5.11 4.47 4.44  HGB 13.9 12.3* 12.0*  HCT 43.0 38.0* 38.4*  MCV 84.1 85.0 86.5  MCH 27.2 27.5 27.0  MCHC 32.3 32.4 31.3  RDW 16.2* 16.0* 16.2*  PLT 127* 103* 87*   Thyroid  No results for input(s): "TSH", "FREET4" in the last 168 hours.  BNP Recent Labs  Lab 04/12/24 1559  BNP 32.1    DDimer  Recent Labs  Lab 04/12/24 1559  DDIMER 1.69*     Radiology    NM Myocar Multi W/Spect W/Wall Motion / EF Result Date: 04/14/2024 Pharmacological myocardial perfusion imaging study with no significant  ischemia Normal wall motion, EF estimated at 56% No EKG changes concerning for ischemia at peak stress or in recovery. CT attenuation correction images with mild coronary calcification and aortic atherosclerosis Low risk scan Signed, Juanda Noon, MD, Ph.D [facility]   US  Venous Img Lower Bilateral (DVT) Result Date: 04/12/2024 CLINICAL DATA:  Positive D-dimer EXAM: BILATERAL LOWER EXTREMITY VENOUS DOPPLER ULTRASOUND TECHNIQUE: Gray-scale sonography with graded compression, as well as color Doppler and duplex ultrasound were performed to evaluate the lower extremity deep venous systems from the level of the common femoral vein and including the common femoral, femoral, profunda femoral, popliteal and calf veins including the posterior tibial, peroneal and gastrocnemius veins when visible. The superficial great saphenous vein was also interrogated. Spectral Doppler was utilized to evaluate flow at rest and with distal augmentation maneuvers in the common femoral, femoral and popliteal veins. COMPARISON:  None Available. FINDINGS: RIGHT LOWER EXTREMITY Common Femoral Vein: No evidence of thrombus. Normal compressibility, respiratory phasicity and response to augmentation. Saphenofemoral Junction: No evidence of thrombus. Normal compressibility and flow on color Doppler imaging. Profunda Femoral Vein: No evidence of thrombus. Normal compressibility and flow on color Doppler imaging. Femoral Vein: No evidence of thrombus. Normal compressibility, respiratory phasicity and response to augmentation. Popliteal Vein: No evidence of thrombus. Normal compressibility, respiratory phasicity and response to augmentation. Calf Veins: No evidence of thrombus. Normal compressibility and flow on color Doppler imaging. Superficial Great Saphenous Vein: No evidence of thrombus. Normal compressibility. Venous Reflux:  None. Other Findings:  None. LEFT LOWER EXTREMITY Common Femoral Vein: No evidence of thrombus. Normal compressibility, respiratory phasicity and response to augmentation. Saphenofemoral Junction: No evidence of thrombus. Normal compressibility and flow on color Doppler imaging. Profunda Femoral Vein: No evidence of thrombus. Normal compressibility  and flow on color Doppler imaging. Femoral Vein: No evidence of thrombus. Normal compressibility, respiratory phasicity and response to augmentation. Popliteal Vein: Echogenic nonocclusive adherent clot noted in the distal popliteal vein, likely chronic nonocclusive clot. Calf Veins: No evidence of thrombus. Normal compressibility and flow on color Doppler imaging. Superficial Great Saphenous Vein: No evidence of thrombus. Normal compressibility. Venous Reflux:  None. Other Findings:  Popliteal fossa cyst measures 5.0 x 3.3 x 2.0 cm. IMPRESSION: No evidence of acute DVT in the lower extremities. 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 Left Baker's cyst. Electronically Signed   By: Janeece Mechanic M.D.   On: 04/12/2024 23:44  CT Angio Chest PE W and/or Wo Contrast Result Date: 04/12/2024 CLINICAL DATA:  Left-sided chest pain, pleuritic chest pain, hypoxia EXAM: CT ANGIOGRAPHY CHEST WITH CONTRAST TECHNIQUE: Multidetector CT imaging of the chest was performed using the standard protocol during bolus administration of intravenous contrast. Multiplanar CT image reconstructions and MIPs were obtained to evaluate the vascular anatomy. RADIATION DOSE REDUCTION: This exam was performed according to the departmental dose-optimization program which includes automated exposure control, adjustment of the mA and/or kV according to patient size and/or use of iterative reconstruction technique. CONTRAST:  75mL OMNIPAQUE  IOHEXOL  350 MG/ML SOLN COMPARISON:  10/13/2023 FINDINGS: Cardiovascular: There is adequate opacification of the pulmonary arterial tree. There is no intraluminal filling defect identified to suggest acute pulmonary embolism. The central pulmonary arteries are enlarged in keeping with changes of pulmonary arterial hypertension. Moderate multi-vessel coronary artery calcification. Calcification of the aortic leaflets noted. Global cardiac size is within normal  limits. No pericardial effusion. Mild atherosclerotic calcification within the thoracic aorta. No aortic aneurysm. Mediastinum/Nodes: No enlarged mediastinal, hilar, or axillary lymph nodes. Thyroid  gland, trachea, and esophagus demonstrate no significant findings. Lungs/Pleura: There is diffuse bronchial wall thickening in keeping with airway inflammation. Sparse scattered ground-glass opacity within lung bases bilaterally likely reflect mild atelectasis. No confluent pulmonary infiltrate. No pneumothorax pleural effusion. Upper Abdomen: There is moderate splenomegaly with the spleen measuring at least 15 cm in greatest dimension, incompletely included on this examination. No acute abnormality. Cholecystectomy has been performed. Musculoskeletal: No acute bone abnormality. No lytic or blastic bone lesion. Osseous structures are age appropriate. C6-7 cervical fusion with instrumentation has been performed. Review of the MIP images confirms the above findings. IMPRESSION: 1. No pulmonary embolism. 2. Diffuse bronchial wall thickening in keeping with airway inflammation. 3. Moderate multi-vessel coronary artery calcification. 4. Morphologic changes in keeping with pulmonary arterial hypertension 5. Moderate splenomegaly, incompletely included on this examination. Aortic Atherosclerosis (ICD10-I70.0). Electronically Signed   By: Worthy Heads M.D.   On: 04/12/2024 20:13   DG Chest 2 View Result Date: 04/12/2024 CLINICAL DATA:  Chest pain. EXAM: CHEST - 2 VIEW COMPARISON:  10/13/2023. FINDINGS: Bilateral lung fields are clear. Bilateral costophrenic angles are clear. Normal cardio-mediastinal silhouette. No acute osseous abnormalities. The soft tissues are within normal limits. IMPRESSION: No active cardiopulmonary disease. Electronically Signed   By: Beula Brunswick M.D.   On: 04/12/2024 16:15    Cardiac Studies     Patient Profile     Jerry Parsons is a 73 y.o. male with a history of persistent atrial  flutter on amiodarone  and Eliquis , diabetes, hypertension, hyperlipidemia, obesity, sleep apnea, stage II-III chronic kidney disease, depression, chronic back pain, spinal stenosis, osteoarthritis, TIA, and peripheral neuropathy, who is being seen today for the evaluation of chest pain   Assessment & Plan    Chest pain Coronary calcification on CT scan Some typical and atypical features EKG nonacute, cardiac enzymes negative CT scan chest negative for PE Myoview  this morning low risk study, no ischemia No further ischemic workup needed at this time  Persistent atrial flutter Underwent cardioversion January 25, 2024 Amiodarone  down to 200 daily, continue metoprolol ,  Restart Eliquis  5 twice daily  Chronic renal failure Creatinine stable 1.74 Torsemide  20 mg every other day down from daily spironolactone on hold Not on ARB Continue Farxiga   Chronic diastolic CHF Minimal leg edema Previously taking torsemide  20 daily Recommend we decrease torsemide  down to 20 mg every other day   For questions or updates, please contact [facility] Please consult [email or web address] for contact info under        Signed, Tecla Mailloux, MD  04/14/2024, 1:01 PM

## 2024-04-14 NOTE — Discharge Summary (Signed)
 Physician Discharge Summary   Patient: GWENDOLYN NISHI MRN: [id number] DOB: October 04, 1951  Admit date:     04/12/2024  Discharge date: 04/14/24  Discharge Physician: Aisha Hove   PCP: Nikki Barters, MD   Recommendations at discharge:    PCP follow up in 1 week. Cardiology follow up as scheduled.  Discharge Diagnoses: Principal Problem:   Chest pain Active Problems:   Hyperkalemia   Hyperlipidemia   HTN (hypertension)   Chronic diastolic CHF (congestive heart failure) (HCC)   Acute renal failure superimposed on stage 3a chronic kidney disease (HCC)   TIA (transient ischemic attack)   Thrombocytopenia (HCC)   Paroxysmal atrial fibrillation (HCC)   Positive D dimer   DVT (deep venous thrombosis) (HCC)   Hx of pulmonary embolus   OSA on CPAP   Depression   Obesity (BMI 30-39.9)   Unstable angina (HCC)   Coronary artery disease of native artery of native heart with stable angina pectoris (HCC)  Resolved Problems:   * No resolved hospital problems. *  Hospital Course: Kinston Magnan is a 73 year old man with history of persistent atrial fibrillation/flutter on amiodarone  and apixaban , hypertension, hyperlipidemia, type 2 diabetes mellitus, obesity, sleep apnea, chronic kidney disease, depression, chronic back pain secondary to spinal stenosis, TIA, and peripheral neuropathy, presented to the ED for evaluation of chest pain.  EKG revealed sinus bradycardia with incomplete right bundle branch block.  CT chest negative for PE, showed significant coronary calcifications.  Cardiac markers negative.  Patient is continued on aspirin  and statin.  Cardiology evaluated the patient recommended pharmacological myocardial perfusion stress test.  Patient stress test negative and he is hematin stable to be discharged home.  Advised patient to follow-up PCP upon discharge       Consultants: Cardiology Procedures performed: Myoview   Disposition: Home Diet recommendation:  Discharge  Diet Orders (From admission, onward)     Start     Ordered   04/14/24 0000  Diet - low sodium heart healthy        04/14/24 1315           Cardiac and Carb modified diet DISCHARGE MEDICATION: Allergies as of 04/14/2024       Reactions   Carisoprodol Itching, Nausea And Vomiting   Reaction:  Unknown   Sensitivity   Reaction:  Unknown     Reaction:  Unknown     Sensitivity     Reaction:  Unknown        Medication List     TAKE these medications    amiodarone  200 MG tablet Commonly known as: PACERONE  Take 1 tablet (200 mg total) by mouth daily. What changed: when to take this   aspirin  EC 81 MG tablet Take 1 tablet (81 mg total) by mouth daily. Swallow whole. Start taking on: April 15, 2024   atorvastatin  40 MG tablet Commonly known as: LIPITOR Take 1 tablet (40 mg total) by mouth daily. Start taking on: April 15, 2024   Basaglar  KwikPen 100 UNIT/ML INJECT 40 UNITS            SUBCUTANEOUSLY TWO TIMES A DAY What changed: See the new instructions.   cholecalciferol  25 MCG (1000 UNIT) tablet Commonly known as: VITAMIN D3 Take 1,000 Units by mouth 2 (two) times daily.   cyclobenzaprine  10 MG tablet Commonly known as: FLEXERIL  Take 10 mg by mouth 3 (three) times daily.   docusate sodium  100 MG capsule Commonly known as: COLACE Take 100 mg by mouth 2 (two) times daily.  dutasteride  0.5 MG capsule Commonly known as: AVODART  Take 0.5 mg by mouth daily.   Eliquis  5 MG Tabs tablet Generic drug: apixaban  TAKE 1 TABLET TWICE A DAY   Farxiga  10 MG Tabs tablet Generic drug: dapagliflozin  propanediol TAKE 1 TABLET BY MOUTH EVERY DAY   fexofenadine 180 MG tablet Commonly known as: ALLEGRA Take 180 mg by mouth daily.   Fish Oil 1200 MG Caps Take 2,400 mg by mouth 2 (two) times daily.   FreeStyle Libre 2 Reader Devi Inject 1 Device into the skin daily. DGX   FreeStyle Libre 2 Sensor Misc 2 Devices by Does not apply route daily.   gabapentin  100 MG  capsule Commonly known as: NEURONTIN  Take 100 mg by mouth 2 (two) times daily. 100 mg in the morning, 100 mg 3p   gabapentin  300 MG capsule Commonly known as: NEURONTIN  Take 1 capsule (300 mg total) by mouth at bedtime.   magnesium  oxide 400 (240 Mg) MG tablet Commonly known as: MAG-OX Take 1 tablet by mouth 2 (two) times daily.   metoprolol  tartrate 50 MG tablet Commonly known as: LOPRESSOR  Take 50 mg by mouth 2 (two) times daily.   naloxone  4 MG/0.1ML Liqd nasal spray kit Commonly known as: NARCAN  Please use in the event of overdose from narcotic medication   nortriptyline  10 MG capsule Commonly known as: PAMELOR  TAKE 1 CAP BY MOUTH NIGHTLY X 1 WEEK, THEN INCREASE TO 2 CAPS NIGHTLY X 1 WEEK, THEN INCREASE TO 3 CAPS NIGHTLY   NovoLOG  FlexPen 100 UNIT/ML FlexPen Generic drug: insulin  aspart INJECT 14 UNITS            SUBCUTANEOUSLY IN THE      MORNING AND AT BEDTIME   OneTouch Ultra test strip Generic drug: glucose blood USE WITH METER TWICE DAILY   oxyCODONE  40 mg 12 hr tablet Commonly known as: OxyCONTIN  Take 1 tablet (40 mg total) by mouth every 8 (eight) hours as needed.   pantoprazole  20 MG tablet Commonly known as: PROTONIX  Take by mouth.   polyethylene glycol 17 g packet Commonly known as: MIRALAX  / GLYCOLAX  Take 17 g by mouth daily.   pregabalin  200 MG capsule Commonly known as: LYRICA  TAKE 1 CAPSULE 3 TIMES A   DAY   rOPINIRole  1 MG tablet Commonly known as: REQUIP  TAKE 1 TO 2 TABLETS BY MOUTH AT BEDTIME AS NEEDED What changed:  how much to take when to take this additional instructions   rOPINIRole  2 MG tablet Commonly known as: REQUIP  TAKE 1 TABLET BY MOUTH TWICE A DAY AS NEEDED What changed: when to take this   sitaGLIPtin  100 MG tablet Commonly known as: JANUVIA  Take by mouth.   tamsulosin  0.4 MG Caps capsule Commonly known as: FLOMAX  Take 0.4 mg by mouth daily.   telmisartan  40 MG tablet Commonly known as: MICARDIS  Take by mouth.    torsemide  20 MG tablet Commonly known as: DEMADEX  Take 1 tablet (20 mg total) by mouth every other day. Take one tablet as needed for a weight gain of 2 lbs in 24 hrs or 5 lbs in a week. What changed:  how much to take how to take this when to take this               Discharge Care Instructions  (From admission, onward)           Start     Ordered   04/14/24 0000  Discharge wound care:       Comments: Cleanse  sacral/R glute wound with Vashe wound cleanser Timm Foot [phone]), do not rinse and allow to air dry. Apply Medihoney to wound bed daily, using a Q tip applicator fill wound with dry gauze, cover with ABD pad or silicone foam whichever is preferred.  2.Cleanse R lower leg wounds with Vashe wound cleanser Timm Foot [phone]), do not rinse and allow to air dry. Apply Medihoney to wounds daily, cover with dry gauze and silicone foam   04/14/24 1315            Discharge Exam: Filed Weights   04/12/24 1556 04/13/24 0318  Weight: 122.3 kg 125.3 kg      04/14/2024   11:37 AM 04/14/2024   10:25 AM 04/14/2024    4:36 AM  Vitals with BMI  Systolic 123 116 401  Diastolic 49 50 76  Pulse 61 63 59    General - Elderly Caucasian male, no apparent distress HEENT - PERRLA, EOMI, atraumatic head, non tender sinuses. Lung - Clear, rales, rhonchi, wheezes. Heart - S1, S2 heard, no murmurs, rubs, trace pedal edema Neuro - Alert, awake and oriented x 3, non focal exam. Skin - Warm and dry.  Condition at discharge: stable  The results of significant diagnostics from this hospitalization (including imaging, microbiology, ancillary and laboratory) are listed below for reference.   Imaging Studies: NM Myocar Multi W/Spect W/Wall Motion / EF Result Date: 04/14/2024 Pharmacological myocardial perfusion imaging study with no significant  ischemia Normal wall motion, EF estimated at 56% No EKG changes concerning for ischemia at peak stress or in recovery. CT attenuation correction  images with mild coronary calcification and aortic atherosclerosis Low risk scan Signed, Juanda Noon, MD, Ph.D [facility]   US  Venous Img Lower Bilateral (DVT) Result Date: 04/12/2024 CLINICAL DATA:  Positive D-dimer EXAM: BILATERAL LOWER EXTREMITY VENOUS DOPPLER ULTRASOUND TECHNIQUE: Gray-scale sonography with graded compression, as well as color Doppler and duplex ultrasound were performed to evaluate the lower extremity deep venous systems from the level of the common femoral vein and including the common femoral, femoral, profunda femoral, popliteal and calf veins including the posterior tibial, peroneal and gastrocnemius veins when visible. The superficial great saphenous vein was also interrogated. Spectral Doppler was utilized to evaluate flow at rest and with distal augmentation maneuvers in the common femoral, femoral and popliteal veins. COMPARISON:  None Available. FINDINGS: RIGHT LOWER EXTREMITY Common Femoral Vein: No evidence of thrombus. Normal compressibility, respiratory phasicity and response to augmentation. Saphenofemoral Junction: No evidence of thrombus. Normal compressibility and flow on color Doppler imaging. Profunda Femoral Vein: No evidence of thrombus. Normal compressibility and flow on color Doppler imaging. Femoral Vein: No evidence of thrombus. Normal compressibility, respiratory phasicity and response to augmentation. Popliteal Vein: No evidence of thrombus. Normal compressibility, respiratory phasicity and response to augmentation. Calf Veins: No evidence of thrombus. Normal compressibility and flow on color Doppler imaging. Superficial Great Saphenous Vein: No evidence of thrombus. Normal compressibility. Venous Reflux:  None. Other Findings:  None. LEFT LOWER EXTREMITY Common Femoral Vein: No evidence of thrombus. Normal compressibility, respiratory phasicity and response to augmentation. Saphenofemoral Junction: No evidence of thrombus. Normal compressibility and flow on  color Doppler imaging. Profunda Femoral Vein: No evidence of thrombus. Normal compressibility and flow on color Doppler imaging. Femoral Vein: No evidence of thrombus. Normal compressibility, respiratory phasicity and response to augmentation. Popliteal Vein: Echogenic nonocclusive adherent clot noted in the distal popliteal vein, likely chronic nonocclusive clot. Calf Veins: No evidence of thrombus. Normal compressibility and flow on color Doppler  imaging. Superficial Great Saphenous Vein: No evidence of thrombus. Normal compressibility. Venous Reflux:  None. Other Findings:  Popliteal fossa cyst measures 5.0 x 3.3 x 2.0 cm. IMPRESSION: No evidence of acute DVT in the lower extremities. 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 Left Baker's cyst. Electronically Signed   By: Janeece Mechanic M.D.   On: 04/12/2024 23:44   CT Angio Chest PE W and/or Wo Contrast Result Date: 04/12/2024 CLINICAL DATA:  Left-sided chest pain, pleuritic chest pain, hypoxia EXAM: CT ANGIOGRAPHY CHEST WITH CONTRAST TECHNIQUE: Multidetector CT imaging of the chest was performed using the standard protocol during bolus administration of intravenous contrast. Multiplanar CT image reconstructions and MIPs were obtained to evaluate the vascular anatomy. RADIATION DOSE REDUCTION: This exam was performed according to the departmental dose-optimization program which includes automated exposure control, adjustment of the mA and/or kV according to patient size and/or use of iterative reconstruction technique. CONTRAST:  75mL OMNIPAQUE  IOHEXOL  350 MG/ML SOLN COMPARISON:  10/13/2023 FINDINGS: Cardiovascular: There is adequate opacification of the pulmonary arterial tree. There is no intraluminal filling defect identified to suggest acute pulmonary embolism. The central pulmonary arteries are enlarged in keeping with changes of pulmonary arterial hypertension. Moderate multi-vessel coronary artery  calcification. Calcification of the aortic leaflets noted. Global cardiac size is within normal limits. No pericardial effusion. Mild atherosclerotic calcification within the thoracic aorta. No aortic aneurysm. Mediastinum/Nodes: No enlarged mediastinal, hilar, or axillary lymph nodes. Thyroid  gland, trachea, and esophagus demonstrate no significant findings. Lungs/Pleura: There is diffuse bronchial wall thickening in keeping with airway inflammation. Sparse scattered ground-glass opacity within lung bases bilaterally likely reflect mild atelectasis. No confluent pulmonary infiltrate. No pneumothorax pleural effusion. Upper Abdomen: There is moderate splenomegaly with the spleen measuring at least 15 cm in greatest dimension, incompletely included on this examination. No acute abnormality. Cholecystectomy has been performed. Musculoskeletal: No acute bone abnormality. No lytic or blastic bone lesion. Osseous structures are age appropriate. C6-7 cervical fusion with instrumentation has been performed. Review of the MIP images confirms the above findings. IMPRESSION: 1. No pulmonary embolism. 2. Diffuse bronchial wall thickening in keeping with airway inflammation. 3. Moderate multi-vessel coronary artery calcification. 4. Morphologic changes in keeping with pulmonary arterial hypertension 5. Moderate splenomegaly, incompletely included on this examination. Aortic Atherosclerosis (ICD10-I70.0). Electronically Signed   By: Worthy Heads M.D.   On: 04/12/2024 20:13   DG Chest 2 View Result Date: 04/12/2024 CLINICAL DATA:  Chest pain. EXAM: CHEST - 2 VIEW COMPARISON:  10/13/2023. FINDINGS: Bilateral lung fields are clear. Bilateral costophrenic angles are clear. Normal cardio-mediastinal silhouette. No acute osseous abnormalities. The soft tissues are within normal limits. IMPRESSION: No active cardiopulmonary disease. Electronically Signed   By: Beula Brunswick M.D.   On: 04/12/2024 16:15    Microbiology: Results  for orders placed or performed during the hospital encounter of 10/13/23  Blood culture (routine x 2)     Status: None   Collection Time: 10/13/23  4:47 PM   Specimen: BLOOD  Result Value Ref Range Status   Specimen Description BLOOD BLOOD RIGHT ARM  Final   Special Requests   Final    BOTTLES DRAWN AEROBIC AND ANAEROBIC Blood Culture adequate volume   Culture   Final    NO GROWTH 5 DAYS Performed at [facility], [address], [place], [place] [address]    Report Status 10/18/2023 FINAL  Final  Blood culture (routine x 2)     Status: None  Collection Time: 10/13/23  4:47 PM   Specimen: BLOOD  Result Value Ref Range Status   Specimen Description BLOOD BLOOD RIGHT FOREARM  Final   Special Requests   Final    BOTTLES DRAWN AEROBIC AND ANAEROBIC Blood Culture results may not be optimal due to an excessive volume of blood received in culture bottles   Culture   Final    NO GROWTH 5 DAYS Performed at [facility], [address] [place], [place] [address]    Report Status 10/18/2023 FINAL  Final  Resp panel by RT-PCR (RSV, Flu A&B, Covid) Anterior Nasal Swab     Status: None   Collection Time: 10/13/23  6:47 PM   Specimen: Anterior Nasal Swab  Result Value Ref Range Status   SARS Coronavirus 2 by RT PCR NEGATIVE NEGATIVE Final    Comment: (NOTE) SARS-CoV-2 target nucleic acids are NOT DETECTED.  The SARS-CoV-2 RNA is generally detectable in upper respiratory specimens during the acute phase of infection. The lowest concentration of SARS-CoV-2 viral copies this assay can detect is 138 copies/mL. A negative result does not preclude SARS-Cov-2 infection and should not be used as the sole basis for treatment or other patient management decisions. A negative result may occur with  improper specimen collection/handling, submission of specimen other than nasopharyngeal swab, presence of viral mutation(s) within the areas targeted by this assay, and inadequate  number of viral copies(<138 copies/mL). A negative result must be combined with clinical observations, patient history, and epidemiological information. The expected result is Negative.  Fact Sheet for Patients:  BloggerCourse.com  Fact Sheet for Healthcare Providers:  SeriousBroker.it  This test is no t yet approved or cleared by the [place]  FDA and  has been authorized for detection and/or diagnosis of SARS-CoV-2 by FDA under an Emergency Use Authorization (EUA). This EUA will remain  in effect (meaning this test can be used) for the duration of the COVID-19 declaration under Section 564(b)(1) of the Act, 21 U.S.C.section 360bbb-3(b)(1), unless the authorization is terminated  or revoked sooner.       Influenza A by PCR NEGATIVE NEGATIVE Final   Influenza B by PCR NEGATIVE NEGATIVE Final    Comment: (NOTE) The Xpert Xpress SARS-CoV-2/FLU/RSV plus assay is intended as an aid in the diagnosis of influenza from Nasopharyngeal swab specimens and should not be used as a sole basis for treatment. Nasal washings and aspirates are unacceptable for Xpert Xpress SARS-CoV-2/FLU/RSV testing.  Fact Sheet for Patients: BloggerCourse.com  Fact Sheet for Healthcare Providers: SeriousBroker.it  This test is not yet approved or cleared by the [place]  FDA and has been authorized for detection and/or diagnosis of SARS-CoV-2 by FDA under an Emergency Use Authorization (EUA). This EUA will remain in effect (meaning this test can be used) for the duration of the COVID-19 declaration under Section 564(b)(1) of the Act, 21 U.S.C. section 360bbb-3(b)(1), unless the authorization is terminated or revoked.     Resp Syncytial Virus by PCR NEGATIVE NEGATIVE Final    Comment: (NOTE) Fact Sheet for Patients: BloggerCourse.com  Fact Sheet for Healthcare  Providers: SeriousBroker.it  This test is not yet approved or cleared by the [place]  FDA and has been authorized for detection and/or diagnosis of SARS-CoV-2 by FDA under an Emergency Use Authorization (EUA). This EUA will remain in effect (meaning this test can be used) for the duration of the COVID-19 declaration under Section 564(b)(1) of the Act, 21 U.S.C. section 360bbb-3(b)(1), unless the authorization is terminated or revoked.  Performed at [facility], [address] [place], [place] [address]     Labs: CBC: Recent Labs  Lab 04/12/24 1559 04/13/24 0442 04/14/24 0436  WBC 6.9 4.4 5.1  HGB 13.9 12.3* 12.0*  HCT 43.0 38.0* 38.4*  MCV 84.1 85.0 86.5  PLT 127* 103* 87*   Basic Metabolic Panel: Recent Labs  Lab 04/12/24 1559 04/13/24 0030 04/13/24 0442 04/14/24 0436  NA 136  --  137 137  K 5.7* 4.0 4.0 4.4  CL 95*  --  98 99  CO2 32  --  30 31  GLUCOSE 126*  --  207* 149*  BUN 44*  --  36* 28*  CREATININE 2.32*  --  1.72* 1.74*  CALCIUM  8.9  --  8.1* 8.0*   Liver Function Tests: No results for input(s): "AST", "ALT", "ALKPHOS", "BILITOT", "PROT", "ALBUMIN" in the last 168 hours. CBG: Recent Labs  Lab 04/13/24 1057 04/13/24 1633 04/13/24 2150 04/14/24 1015 04/14/24 1136  GLUCAP 120* 203* 204* 122* 127*    Discharge time spent: 33 minutes.  Signed: Aisha Hove, MD [facility] 04/14/2024

## 2024-04-14 NOTE — Progress Notes (Signed)
 Cardiology Progress Note   Patient Name: Jerry Parsons Date of Encounter: 04/14/2024  Primary Cardiologist: Timothy Gollan, MD  Subjective   Still w/ mild c/p - 2-3/10.  No dyspnea.  NPO this AM for myoview . Objective   Inpatient Medications    Scheduled Meds:  amiodarone   200 mg Oral Daily   aspirin  EC  81 mg Oral Daily   atorvastatin   40 mg Oral Daily   cholecalciferol   1,000 Units Oral BID   cyclobenzaprine   10 mg Oral TID   dutasteride   0.5 mg Oral Daily   gabapentin   100 mg Oral BID   gabapentin   300 mg Oral QHS   insulin  aspart  0-5 Units Subcutaneous QHS   insulin  aspart  0-9 Units Subcutaneous TID WC   insulin  glargine-yfgn  30 Units Subcutaneous BID   irbesartan   150 mg Oral Daily   leptospermum manuka honey  1 Application Topical Daily   magnesium  oxide  400 mg Oral BID   metoprolol  tartrate  50 mg Oral BID   nortriptyline   10 mg Oral QHS   omega-3 acid ethyl esters  1 g Oral BID   oxyCODONE   40 mg Oral Q12H   pantoprazole   20 mg Oral Daily   pregabalin   200 mg Oral TID   rOPINIRole   1 mg Oral BID   tamsulosin   0.4 mg Oral Daily   Continuous Infusions:  heparin  1,400 Units/hr (04/14/24 0612)   PRN Meds: acetaminophen , hydrALAZINE , loratadine , morphine  injection, nitroGLYCERIN , ondansetron  (ZOFRAN ) IV, oxyCODONE -acetaminophen , rOPINIRole    Vital Signs    Vitals:   04/13/24 2050 04/13/24 2234 04/13/24 2354 04/14/24 0436  BP: (!) 119/50  (!) 124/59 113/76  Pulse: (!) 52 (!) 57 (!) 59 (!) 59  Resp: 18  18 20   Temp: 98.1 F (36.7 C)  98 F (36.7 C) 98.2 F (36.8 C)  TempSrc:    Oral  SpO2: 94%  93% 94%  Weight:      Height:        Intake/Output Summary (Last 24 hours) at 04/14/2024 0850 Last data filed at 04/14/2024 0614 Gross per 24 hour  Intake 403.22 ml  Output 1800 ml  Net -1396.78 ml   Filed Weights   04/12/24 1556 04/13/24 0318  Weight: 122.3 kg 125.3 kg    Physical Exam   GEN: obese, in no acute distress.  HEENT: Grossly normal.   Neck: Supple, no JVD, carotid bruits, or masses. Cardiac: RRR, 2/6 syst murmur throughout, no rubs or gallops. No clubbing, cyanosis.  Chronic venous stasis w/ trace bilat ankle edema.  Radials 2+, DP/PT 2+ and equal bilaterally.  Respiratory:  Respirations regular and unlabored, clear to auscultation bilaterally. GI: Obese, soft, nontender, nondistended, BS + x 4. MS: no deformity or atrophy. Skin: warm and dry, no rash. Neuro:  Strength and sensation are intact. Psych: AAOx3.  Normal affect.  Labs    Chemistry Recent Labs  Lab 04/12/24 1559 04/13/24 0030 04/13/24 0442 04/14/24 0436  NA 136  --  137 137  K 5.7* 4.0 4.0 4.4  CL 95*  --  98 99  CO2 32  --  30 31  GLUCOSE 126*  --  207* 149*  BUN 44*  --  36* 28*  CREATININE 2.32*  --  1.72* 1.74*  CALCIUM  8.9  --  8.1* 8.0*  GFRNONAA 29*  --  41* 41*  ANIONGAP 9  --  9 7     Hematology Recent Labs  Lab 04/12/24 1559 04/13/24  0442 04/14/24 0436  WBC 6.9 4.4 5.1  RBC 5.11 4.47 4.44  HGB 13.9 12.3* 12.0*  HCT 43.0 38.0* 38.4*  MCV 84.1 85.0 86.5  MCH 27.2 27.5 27.0  MCHC 32.3 32.4 31.3  RDW 16.2* 16.0* 16.2*  PLT 127* 103* 87*    Cardiac Enzymes  Recent Labs  Lab 04/12/24 1754 04/13/24 0030 04/13/24 0442 04/13/24 2022 04/13/24 2125  TROPONINIHS 8 8 8 7 7       BNP    Component Value Date/Time   BNP 32.1 04/12/2024 1559   DDimer  Recent Labs  Lab 04/12/24 1559  DDIMER 1.69*     Lipids  Lab Results  Component Value Date   CHOL 163 04/13/2024   HDL 27 (L) 04/13/2024   LDLCALC 97 04/13/2024   TRIG 196 (H) 04/13/2024   CHOLHDL 6.0 04/13/2024    HbA1c  Lab Results  Component Value Date   HGBA1C 8.8 (H) 04/12/2024    Radiology    US  Venous Img Lower Bilateral (DVT) Result Date: 04/12/2024 CLINICAL DATA:  Positive D-dimer EXAM: BILATERAL LOWER EXTREMITY VENOUS DOPPLER ULTRASOUND TECHNIQUE: Gray-scale sonography with graded compression, as well as color Doppler and duplex ultrasound were  performed to evaluate the lower extremity deep venous systems from the level of the common femoral vein and including the common femoral, femoral, profunda femoral, popliteal and calf veins including the posterior tibial, peroneal and gastrocnemius veins when visible. The superficial great saphenous vein was also interrogated. Spectral Doppler was utilized to evaluate flow at rest and with distal augmentation maneuvers in the common femoral, femoral and popliteal veins. COMPARISON:  None Available. FINDINGS: RIGHT LOWER EXTREMITY Common Femoral Vein: No evidence of thrombus. Normal compressibility, respiratory phasicity and response to augmentation. Saphenofemoral Junction: No evidence of thrombus. Normal compressibility and flow on color Doppler imaging. Profunda Femoral Vein: No evidence of thrombus. Normal compressibility and flow on color Doppler imaging. Femoral Vein: No evidence of thrombus. Normal compressibility, respiratory phasicity and response to augmentation. Popliteal Vein: No evidence of thrombus. Normal compressibility, respiratory phasicity and response to augmentation. Calf Veins: No evidence of thrombus. Normal compressibility and flow on color Doppler imaging. Superficial Great Saphenous Vein: No evidence of thrombus. Normal compressibility. Venous Reflux:  None. Other Findings:  None. LEFT LOWER EXTREMITY Common Femoral Vein: No evidence of thrombus. Normal compressibility, respiratory phasicity and response to augmentation. Saphenofemoral Junction: No evidence of thrombus. Normal compressibility and flow on color Doppler imaging. Profunda Femoral Vein: No evidence of thrombus. Normal compressibility and flow on color Doppler imaging. Femoral Vein: No evidence of thrombus. Normal compressibility, respiratory phasicity and response to augmentation. Popliteal Vein: Echogenic nonocclusive adherent clot noted in the distal popliteal vein, likely chronic nonocclusive clot. Calf Veins: No evidence of  thrombus. Normal compressibility and flow on color Doppler imaging. Superficial Great Saphenous Vein: No evidence of thrombus. Normal compressibility. Venous Reflux:  None. Other Findings:  Popliteal fossa cyst measures 5.0 x 3.3 x 2.0 cm. IMPRESSION: No evidence of acute DVT in the lower extremities. 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 Left Baker's cyst. Electronically Signed   By: Janeece Mechanic M.D.   On: 04/12/2024 23:44   CT Angio Chest PE W and/or Wo Contrast Result Date: 04/12/2024 CLINICAL DATA:  Left-sided chest pain, pleuritic chest pain, hypoxia EXAM: CT ANGIOGRAPHY CHEST WITH CONTRAST TECHNIQUE: Multidetector CT imaging of the chest was performed using the standard protocol during bolus administration of intravenous contrast. Multiplanar CT image reconstructions and MIPs  were obtained to evaluate the vascular anatomy. RADIATION DOSE REDUCTION: This exam was performed according to the departmental dose-optimization program which includes automated exposure control, adjustment of the mA and/or kV according to patient size and/or use of iterative reconstruction technique. CONTRAST:  75mL OMNIPAQUE  IOHEXOL  350 MG/ML SOLN COMPARISON:  10/13/2023 FINDINGS: Cardiovascular: There is adequate opacification of the pulmonary arterial tree. There is no intraluminal filling defect identified to suggest acute pulmonary embolism. The central pulmonary arteries are enlarged in keeping with changes of pulmonary arterial hypertension. Moderate multi-vessel coronary artery calcification. Calcification of the aortic leaflets noted. Global cardiac size is within normal limits. No pericardial effusion. Mild atherosclerotic calcification within the thoracic aorta. No aortic aneurysm. Mediastinum/Nodes: No enlarged mediastinal, hilar, or axillary lymph nodes. Thyroid  gland, trachea, and esophagus demonstrate no significant findings. Lungs/Pleura: There is diffuse  bronchial wall thickening in keeping with airway inflammation. Sparse scattered ground-glass opacity within lung bases bilaterally likely reflect mild atelectasis. No confluent pulmonary infiltrate. No pneumothorax pleural effusion. Upper Abdomen: There is moderate splenomegaly with the spleen measuring at least 15 cm in greatest dimension, incompletely included on this examination. No acute abnormality. Cholecystectomy has been performed. Musculoskeletal: No acute bone abnormality. No lytic or blastic bone lesion. Osseous structures are age appropriate. C6-7 cervical fusion with instrumentation has been performed. Review of the MIP images confirms the above findings. IMPRESSION: 1. No pulmonary embolism. 2. Diffuse bronchial wall thickening in keeping with airway inflammation. 3. Moderate multi-vessel coronary artery calcification. 4. Morphologic changes in keeping with pulmonary arterial hypertension 5. Moderate splenomegaly, incompletely included on this examination. Aortic Atherosclerosis (ICD10-I70.0). Electronically Signed   By: Worthy Heads M.D.   On: 04/12/2024 20:13   DG Chest 2 View Result Date: 04/12/2024 CLINICAL DATA:  Chest pain. EXAM: CHEST - 2 VIEW COMPARISON:  10/13/2023. FINDINGS: Bilateral lung fields are clear. Bilateral costophrenic angles are clear. Normal cardio-mediastinal silhouette. No acute osseous abnormalities. The soft tissues are within normal limits. IMPRESSION: No active cardiopulmonary disease. Electronically Signed   By: Beula Brunswick M.D.   On: 04/12/2024 16:15     Telemetry    RSR - seen in nuc med - Personally Reviewed  Cardiac Studies   2D Echocardiogram 2.2025  1. Left ventricular ejection fraction, by estimation, is 60 to 65%. The  left ventricle has normal function. The left ventricle has no regional  wall motion abnormalities. Left ventricular diastolic parameters were  normal.   2. Right ventricular systolic function is normal. The right ventricular   size is normal. Tricuspid regurgitation signal is inadequate for assessing  PA pressure.   3. Left atrial size was mildly dilated.   4. The mitral valve is normal in structure. Mild mitral valve  regurgitation. No evidence of mitral stenosis.   5. The aortic valve is normal in structure. There is moderate  calcification of the aortic valve. Aortic valve regurgitation is not  visualized. Aortic valve sclerosis/calcification is present, without any  evidence of aortic stenosis. Aortic valve area,  by VTI measures 2.20 cm. Aortic valve mean gradient measures 11.0 mmHg.  Aortic valve Vmax measures 2.31 m/s.   6. The inferior vena cava is normal in size with greater than 50%  respiratory variability, suggesting right atrial pressure of 3 mmHg.  _____________   Patient Profile     73 y.o. male  with a history of persistent atrial flutter on amiodarone  and Eliquis , diabetes, hypertension, hyperlipidemia, obesity, sleep apnea, stage II-III chronic kidney disease, depression, chronic back pain, spinal stenosis, osteoarthritis,  TIA, and peripheral neuropathy, who was admitted 4/17 with chest pain, nl trops.  Assessment & Plan    1.  Precordial pain/coronary calcium : Patient presented April 17 with a 24-hour history of constant chest pain worse with internal rotation and abduction of the left shoulder/arm.  ECG unremarkable.  Despite prolonged symptoms, troponins normal.  CT of the chest was negative for PE, though he does have significant coronary calcifications.  Still having mild, constant, c/p.  Myoview  cancelled 4/18 b/c pt ate a biscuit.  NPO this AM for myoview .  Further recs pending results.   2.  Persistent atrial flutter: Status post successful cardioversion on January 29.  Maintaining sinus rhythm. Cont amio 200 daily, ? blocker.  Eliquis  on hold - now on heparin , in case invasive eval required.   3.  Primary hypertension: Stable on beta-blocker and ARB.  Home dose of spiro held in  setting of hyperkalemia on admission.   4.  Hyperlipidemia: LDL of 97 this morning. Continue atorvastatin  therapy.   5.  Acute on chronic stage III kidney disease: BUN and creatinine elevated above baseline of 44/2.32 on admission.  He had been taking torsemide  20 mg daily at home.  We previously had reduced dosing to as needed in the setting of rising creatinine in December 2024.  Torsemide  and spironolactone currently on hold.  Creat stable this AM.   6.  Hyperkalemia: Potassium 5.7 on admission.  Normal this morning.  Spironolactone on hold.  Resume ARB and follow-up potassium in the morning. K nl this AM.   7.  Chronic HFpEF: Chronic, mild lower extremity edema and venous stasis.  As above, he was taking torsemide  20 mg daily at home.  This is currently on hold and he will likely need to use on an as-needed basis while implementing conservative approaches to managing chronic lower extremity edema such as compression.  Signed, Laneta Pintos, NP  04/14/2024, 8:50 AM    For questions or updates, please contact   Please consult [email or web address] for contact info under Cardiology/STEMI.

## 2024-04-17 ENCOUNTER — Other Ambulatory Visit: Payer: Self-pay | Admitting: Podiatry

## 2024-04-27 ENCOUNTER — Encounter: Payer: Self-pay | Admitting: Nurse Practitioner

## 2024-04-27 ENCOUNTER — Ambulatory Visit
Admission: RE | Admit: 2024-04-27 | Discharge: 2024-04-27 | Disposition: A | Discharge status: Home / Self Care | Source: Ambulatory Visit | Attending: Nurse Practitioner | Admitting: Nurse Practitioner

## 2024-04-27 ENCOUNTER — Ambulatory Visit: Attending: Nurse Practitioner | Admitting: Nurse Practitioner

## 2024-04-27 ENCOUNTER — Other Ambulatory Visit
Admission: RE | Admit: 2024-04-27 | Discharge: 2024-04-27 | Disposition: A | Discharge status: Home / Self Care | Source: Ambulatory Visit | Attending: Nurse Practitioner | Admitting: Nurse Practitioner

## 2024-04-27 VITALS — BP 90/56 | HR 67 | Ht 70.0 in | Wt 279.0 lb

## 2024-04-27 DIAGNOSIS — I959 Hypotension, unspecified: Secondary | ICD-10-CM | POA: Diagnosis present

## 2024-04-27 DIAGNOSIS — Z794 Long term (current) use of insulin: Secondary | ICD-10-CM | POA: Insufficient documentation

## 2024-04-27 DIAGNOSIS — E782 Mixed hyperlipidemia: Secondary | ICD-10-CM | POA: Insufficient documentation

## 2024-04-27 DIAGNOSIS — R051 Acute cough: Secondary | ICD-10-CM | POA: Insufficient documentation

## 2024-04-27 DIAGNOSIS — E1142 Type 2 diabetes mellitus with diabetic polyneuropathy: Secondary | ICD-10-CM | POA: Insufficient documentation

## 2024-04-27 DIAGNOSIS — N183 Chronic kidney disease, stage 3 unspecified: Secondary | ICD-10-CM | POA: Insufficient documentation

## 2024-04-27 DIAGNOSIS — I5032 Chronic diastolic (congestive) heart failure: Secondary | ICD-10-CM | POA: Insufficient documentation

## 2024-04-27 DIAGNOSIS — R072 Precordial pain: Secondary | ICD-10-CM | POA: Diagnosis present

## 2024-04-27 DIAGNOSIS — I483 Typical atrial flutter: Secondary | ICD-10-CM | POA: Diagnosis present

## 2024-04-27 LAB — BASIC METABOLIC PANEL WITH GFR
Anion gap: 12 (ref 5–15)
BUN: 49 mg/dL — ABNORMAL HIGH (ref 8–23)
CO2: 28 mmol/L (ref 22–32)
Calcium: 8.6 mg/dL — ABNORMAL LOW (ref 8.9–10.3)
Chloride: 98 mmol/L (ref 98–111)
Creatinine, Ser: 2.03 mg/dL — ABNORMAL HIGH (ref 0.61–1.24)
GFR, Estimated: 34 mL/min — ABNORMAL LOW (ref >60)
Glucose, Bld: 108 mg/dL — ABNORMAL HIGH (ref 70–99)
Potassium: 5.2 mmol/L — ABNORMAL HIGH (ref 3.5–5.1)
Sodium: 138 mmol/L (ref 135–145)

## 2024-04-27 MED ORDER — TELMISARTAN 20 MG PO TABS: 20.0000 mg | ORAL_TABLET | Freq: Every day | ORAL | 3 refills | Status: DC

## 2024-04-27 NOTE — Patient Instructions (Signed)
 Medication Instructions:  Your physician recommends the following medication changes.  DECREASE: Telmisartan to 20 mg 1 tablet once per day.  *If you need a refill on your cardiac medications before your next appointment, please call your pharmacy*  Lab Work: Your provider would like for you to return in Today to have the following labs drawn: Basic Metabolic Panel.   Please go to [facility] [address] ([facility] [address], [place] [address] You do not need an appointment.  They are open from 8 am- 4:30 pm.  Lunch from 1:00 pm- 2:00 pm You DO NOT need to be fasting.   You may also go to one of the following [facility]:  [address] [place], [place] [address] Phone: [phone] Lab hours: Mon-Fri 8 am- 5 pm    Lunch 12 pm- 1 pm  [address] [place],  [place]  [address]  [place]  Phone: [phone] Lab hours: 7 am- 4 pm Lunch 12 pm-1 pm   [address] [place],  [place]  [address]  [place]  Phone: [phone] Lab hours: Mon-Fri 8 am- 5 pm    Lunch 12 pm- 1 pm  If you have labs (blood work) drawn today and your tests are completely normal, you will receive your results only by: MyChart Message (if you have MyChart) OR A paper copy in the mail If you have any lab test that is abnormal or we need to change your treatment, we will call you to review the results.  Testing/Procedures:  Your provider has ordered a chest X-Ray for you. You can have this done at the [facility] medical mall. You do not need an appointment. Please go to the entrance of the Medical Mall and check in at the front desk.    Follow-Up: At [facility], you and your health needs are our priority.  As part of our continuing mission to provide you with exceptional heart care, our providers are all part of one team.  This team includes your primary Cardiologist (physician) and Advanced Practice Providers or APPs (Physician Assistants and Nurse Practitioners) who all work together to  provide you with the care you need, when you need it.  Your next appointment:   1 month(s)  Provider:   You may see Timothy Gollan, MD or one of the following Advanced Practice Providers on your designated Care Team:   Laneta Pintos, NP Gildardo Labrador, PA-C Varney Gentleman, PA-C Cadence Dillingham, PA-C Ronald Cockayne, NP Morey Ar, NP    We recommend signing up for the patient portal called "MyChart".  Sign up information is provided on this After Visit Summary.  MyChart is used to connect with patients for Virtual Visits (Telemedicine).  Patients are able to view lab/test results, encounter notes, upcoming appointments, etc.  Non-urgent messages can be sent to your provider as well.   To learn more about what you can do with MyChart, go to [email or web address].

## 2024-04-27 NOTE — Progress Notes (Signed)
 Office Visit    Patient Name: Jerry Parsons Date of Encounter: 04/27/2024  Primary Care Provider:  Nikki Barters, MD Primary Cardiologist:  Jerry Boyden, MD  Chief Complaint    73 y.o. male with a history of persistent atrial flutter on amiodarone  and Eliquis , diabetes, hypertension, hyperlipidemia, obesity, sleep apnea, stage II-III chronic Parsons disease, depression, chronic back pain, spinal stenosis, osteoarthritis, TIA, and peripheral neuropathy, who presents for follow-up after recent hospitalization for chest pain and low risk stress test.  Past Medical History  Subjective   Past Medical History:  Diagnosis Date   Arthritis    Atrial flutter (HCC)    a. 12/2023 s/p DCCV.   Blind right eye    BPH (benign prostatic hyperplasia)    Chest pain    a. 03/2024 MV: EF 56%, mild cor Ca2+. No ischemia/infarct.   Chronic heart failure with preserved ejection fraction (HFpEF) (HCC)    a. 01/2024 Echo: EF 60-65%, no rwma, nl RV fxn, mildly dil LA, mild MR, AoV sclerosis w/o stenosis.   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    x2   GERD (gastroesophageal reflux disease)    Headache    h/o as a child   Hypercholesteremia    Hypertension    Parsons failure    acute   Neuromuscular disorder (HCC)    Neuropathy    Pneumonia    time 9 ;last episode 12/2015   Pulmonary embolus (HCC) 2011   Seasonal allergies    Seizures (HCC)    as child    Sleep apnea    BIPAP   Stiff neck    limited turning s/p titanium plate placement   TIA (transient ischemic attack)    Wears dentures    full upper and lower   Past Surgical History:  Procedure Laterality Date   BACK SURGERY     x 8; upper x 3 & lower x 5   CARDIOVERSION  03/14/13, 10/16   2014 - [facility], 2016 - [facility]   CARDIOVERSION N/A 09/11/2020   Procedure: CARDIOVERSION;   Surgeon: Jerry Fonder, MD;  Location: [facility];  Service: Cardiovascular;  Laterality: N/A;   CARDIOVERSION N/A 01/25/2024   Procedure: CARDIOVERSION;  Surgeon: Jerry Fonder, MD;  Location: [facility];  Service: Cardiovascular;  Laterality: N/A;   CATARACT EXTRACTION W/PHACO Left 10/29/2015   Procedure: CATARACT EXTRACTION PHACO AND INTRAOCULAR LENS PLACEMENT (IOC);  Surgeon: Jerry Kidney, MD;  Location: [facility];  Service: Ophthalmology;  Laterality: Left;  DIABETIC - insulin  and oral medsSleep apnea - no machine   CATARACT EXTRACTION W/PHACO Right 02/16/2023   Procedure: CATARACT EXTRACTION PHACO AND INTRAOCULAR LENS PLACEMENT (IOC) RIGHT DIABETIC MALYUGIN HEALON 5 VISION BLUE  65.52  03:57.7;  Surgeon: Jerry Kidney, MD;  Location: [facility];  Service: Ophthalmology;  Laterality: Right;  Diabetic   CHOLECYSTECTOMY     COLONOSCOPY WITH PROPOFOL  N/A 01/16/2018   Procedure: COLONOSCOPY WITH PROPOFOL ;  Surgeon: Jerry Click, MD;  Location: [facility] END[facility];  Service: Endoscopy;  Laterality: N/A;   ESOPHAGOGASTRODUODENOSCOPY (EGD) WITH PROPOFOL  N/A 01/16/2018   Procedure: ESOPHAGOGASTRODUODENOSCOPY (EGD) WITH PROPOFOL ;  Surgeon: Jerry Click, MD;  Location: [facility] END[facility]COPY;  Service: Endoscopy;  Laterality: N/A;   EYE SURGERY     GALLBLADDER SURGERY  2002  JOINT REPLACEMENT Right 2018   KNEE ARTHROSCOPY     left    PARATHYROIDECTOMY N/A 07/25/2020   Procedure: PARATHYROIDECTOMY;  Surgeon: Jerry Stall, MD;  Location: [facility] OR[facility];  Service: General;  Laterality: N/A;   ROTATOR CUFF REPAIR  2001   left    TEE WITHOUT CARDIOVERSION N/A 04/26/2018   Procedure: TRANSESOPHAGEAL ECHOCARDIOGRAM (TEE);  Surgeon: Jerry Crisp, MD;  Location: [facility];  Service: Cardiovascular;  Laterality: N/A;   TONSILLECTOMY     TOTAL KNEE ARTHROPLASTY Right 06/20/2017   Procedure: RIGHT TOTAL KNEE ARTHROPLASTY;  Surgeon: Jerry Rei, MD;  Location: [facility];  Service:  Orthopedics;  Laterality: Right;    Allergies  Allergies  Allergen Reactions   Carisoprodol Itching and Nausea And Vomiting    Reaction:  Unknown   Sensitivity   Reaction:  Unknown     Reaction:  Unknown     Sensitivity     Reaction:  Unknown      History of Present Illness      73 y.o. y/o male with a history of persistent atrial flutter, diabetes, hypertension, hyperlipidemia, obesity, sleep apnea, stage II-III chronic Parsons disease, depression, chronic back pain, spinal stenosis, osteoarthritis, TIA, and peripheral neuropathy.  He was initially diagnosed with atrial flutter in March 2014, and subsequently underwent cardioversion.  He was hospitalized in June 2021 with recurrent atrial flutter and required cardioversion in September 2021.  Echocardiogram at that time showed an EF of 60-65% with mild LVH, and moderately dilated left atrium.  He has since been managed with amiodarone  and Eliquis .  In October 2024, he was admitted with cough, congestion, and community-acquired pneumonia.  ECG notable for rate controlled atrial flutter.  CTA of the chest was negative for PE, but did show bilateral lower lobe and right middle lobe airspace opacities concerning for edema versus pneumonia.  He was treated with intravenous antibiotics and subsequently discharged in atrial flutter with subsequent 20 pound weight gain and development of significant lower extremity and scrotal edema.  Outpatient diuretics were adjusted in December 2024 with resultant rise in creatinine necessitating reduction of torsemide  to 20 mg daily.  Amiodarone  was increased to 200 mg twice daily in December 2024 in the setting of ongoing atrial flutter with plan for cardioversion however, patient canceled multiple appointments and cardioversion was delayed until late January 2025.  A post cardioversion echocardiogram showed normal LV function with mildly dilated left atrium and aortic valve sclerosis.  Jerry Parsons was seen in  clinic in mid April 2025 with report of severe chest pain and he was referred to the emergency department.  There, troponins were normal.  CT angiogram of the chest was negative for PE though coronary calcium  was noted.  Lower extremity ultrasound was negative for DVT.  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  He did have AKI with a BUN/creatinine of 44/2.32 prompting discontinuation of spironolactone and changing diuretic to torsemide  20 mg every other day.  A Lexiscan  Myoview  was performed which showed normal LV function, mild coronary calcification, and no ischemia or infarct, and he was discharged home.     Since discharge, he notes that chest pain is resolved.  He has chronic back pain as well as leg numbness.  He has also been feeling intermittently lightheaded.  Over the past 2 days, he has been having a productive cough with thick mucus.  He denies fevers but did have chills last  night.  He says it is reminiscent of times where he had pneumonia.  He tried to get an appointment with his PCP today but they were not available.  He has some degree of chronic dyspnea on exertion as well as chronic bilateral woody lower extremity edema.  His wife notes that his edema really has not changed on torsemide  therapy.  His weight has been relatively stable at home.  He has been having left shoulder and chronic back pain.  He fell in the setting of back pain and leg weakness but did not suffer any significant trauma.  He denies any chest pain, palpitations, PND, orthopnea, dizziness, syncope, or early satiety.  He has chronic bilateral lower extremity woody edema which is unchanged despite changing to torsemide . Objective  Home Medications    Current Outpatient Medications  Medication Sig Dispense Refill   amiodarone  (PACERONE ) 200 MG tablet Take 1 tablet (200 mg total) by mouth daily. 90 tablet 3   atorvastatin   (LIPITOR) 40 MG tablet Take 1 tablet (40 mg total) by mouth daily. 30 tablet 2   cholecalciferol  (VITAMIN D3) 25 MCG (1000 UNIT) tablet Take 1,000 Units by mouth 2 (two) times daily.     Continuous Blood Gluc Receiver (FREESTYLE LIBRE 2 READER) DEVI Inject 1 Device into the skin daily. DGX 1 each 3   Continuous Blood Gluc Sensor (FREESTYLE LIBRE 2 SENSOR) MISC 2 Devices by Does not apply route daily. 2 each 0   cyclobenzaprine  (FLEXERIL ) 10 MG tablet Take 10 mg by mouth at bedtime.     docusate sodium  (COLACE) 100 MG capsule Take 100 mg by mouth daily.     dutasteride  (AVODART ) 0.5 MG capsule Take 0.5 mg by mouth daily.     ELIQUIS  5 MG TABS tablet TAKE 1 TABLET TWICE A DAY 180 tablet 1   FARXIGA  10 MG TABS tablet TAKE 1 TABLET BY MOUTH EVERY DAY 90 tablet 3   fexofenadine (ALLEGRA) 180 MG tablet Take 180 mg by mouth daily.     Finerenone (KERENDIA) 20 MG TABS daily at 6 (six) AM.     gabapentin  (NEURONTIN ) 100 MG capsule Take 100 mg by mouth 2 (two) times daily. 100 mg in the morning, 100 mg 3p     gabapentin  (NEURONTIN ) 300 MG capsule Take 1 capsule (300 mg total) by mouth at bedtime. 90 capsule 1   Insulin  Glargine (BASAGLAR  KWIKPEN) 100 UNIT/ML INJECT 40 UNITS            SUBCUTANEOUSLY TWO TIMES A DAY (Patient taking differently: Inject 44 Units into the skin 2 (two) times daily.) 45 mL 1   magnesium  oxide (MAG-OX) 400 (240 Mg) MG tablet Take 1 tablet by mouth 2 (two) times daily.     metoprolol  tartrate (LOPRESSOR ) 50 MG tablet Take 50 mg by mouth 2 (two) times daily.     naloxone  (NARCAN ) nasal spray 4 mg/0.1 mL Please use in the event of overdose from narcotic medication 1 each 1   nortriptyline  (PAMELOR ) 10 MG capsule 30 mg at bedtime.     NOVOLOG  FLEXPEN 100 UNIT/ML FlexPen INJECT 14 UNITS            SUBCUTANEOUSLY IN THE      MORNING AND AT BEDTIME 15 mL 6   Omega-3 Fatty Acids (FISH OIL) 1200 MG CAPS Take 2,400 mg by mouth 2 (two) times daily.     ONETOUCH ULTRA test strip USE WITH  METER TWICE DAILY 100 strip 6   oxyCODONE  (OXYCONTIN ) 40  mg 12 hr tablet Take 1 tablet (40 mg total) by mouth every 8 (eight) hours as needed. (Patient taking differently: Take 40 mg by mouth 3 (three) times daily.) 90 tablet 0   polyethylene glycol (MIRALAX  / GLYCOLAX ) 17 g packet Take 17 g by mouth daily.     pregabalin  (LYRICA ) 200 MG capsule TAKE 1 CAPSULE 3 TIMES A   DAY 270 capsule 1   rOPINIRole  (REQUIP ) 1 MG tablet TAKE 1 TO 2 TABLETS BY MOUTH AT BEDTIME AS NEEDED (Patient taking differently: Take 1 mg by mouth 2 (two) times daily. 1 mg in the morning, 1 mg at 3p) 180 tablet 1   rOPINIRole  (REQUIP ) 2 MG tablet TAKE 1 TABLET BY MOUTH TWICE A DAY AS NEEDED (Patient taking differently: Take 2 mg by mouth at bedtime.) 180 tablet 0   sitaGLIPtin  (JANUVIA ) 100 MG tablet Take by mouth.     tamsulosin  (FLOMAX ) 0.4 MG CAPS capsule Take 0.4 mg by mouth daily.     torsemide  (DEMADEX ) 20 MG tablet Take 1 tablet (20 mg total) by mouth every other day. Take one tablet as needed for a weight gain of 2 lbs in 24 hrs or 5 lbs in a week.     telmisartan (MICARDIS) 20 MG tablet Take 1 tablet (20 mg total) by mouth daily. 90 tablet 3   No current facility-administered medications for this visit.     Physical Exam    VS:  BP (!) 90/56 (BP Location: Left Arm)   Pulse 67   Ht 5\' 10"  (1.778 m)   Wt 279 lb (126.6 kg)   SpO2 96%   BMI 40.03 kg/m  , BMI Body mass index is 40.03 kg/m.     Vitals:   04/27/24 1532  BP: (!) 90/56  Pulse: 67  SpO2: 96%      GEN: Well nourished, well developed, in no acute distress. HEENT: normal. Neck: Supple, no JVD, carotid bruits, or masses. Cardiac: RRR, 2/6 systolic murmur heard throughout.  No rubs or gallops. No clubbing, cyanosis, 2+ bilateral lower extremity woody edema to the knees.  Radials 2+. Respiratory:  Respirations regular and unlabored, clear to auscultation bilaterally. GI: Obese, soft, nontender, nondistended, BS + x 4. MS: no deformity or  atrophy. Skin: warm and dry, no rash. Neuro:  Strength and sensation are intact. Psych: Normal affect.  Accessory Clinical Findings     Lab Results  Component Value Date   WBC 5.1 04/14/2024   HGB 12.0 (L) 04/14/2024   HCT 38.4 (L) 04/14/2024   MCV 86.5 04/14/2024   PLT 87 (L) 04/14/2024   Lab Results  Component Value Date   CREATININE 2.03 (H) 04/27/2024   BUN 49 (H) 04/27/2024   NA 138 04/27/2024   K 5.2 (H) 04/27/2024   CL 98 04/27/2024   CO2 28 04/27/2024   Lab Results  Component Value Date   ALT 19 05/27/2023   AST 21 05/27/2023   ALKPHOS 75 05/27/2023   BILITOT 0.6 05/27/2023   Lab Results  Component Value Date   CHOL 163 04/13/2024   HDL 27 (L) 04/13/2024   LDLCALC 97 04/13/2024   TRIG 196 (H) 04/13/2024   CHOLHDL 6.0 04/13/2024    Lab Results  Component Value Date   HGBA1C 8.8 (H) 04/12/2024   Lab Results  Component Value Date   TSH 6.660 (H) 06/08/2022       Assessment & Plan    1.  Precordial pain/coronary calcium : Patient recently admitted to  [facility] with constant chest pain and normal troponins.  Stress testing was undertaken and was low risk.  CT of the chest was negative for PE.  He has not had any further precordial pain since hospital discharge.  No further ischemic evaluation is warranted at this time.  Stop aspirin  in the setting of chronic Eliquis .  2.  Relative hypotension: Patient has been having orthostatic lightheadedness at home and blood pressure was 90/56 here.  We discontinued spironolactone during his hospitalization due to hyperkalemia, and he confirms that he is still off of this.  We did switch him from furosemide  to torsemide  without any significant change in lower extremity swelling.  I am reducing his Micardis to 20 mg daily.  See below regarding AKI.    3.  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  In that setting, I spoke with  his wife.  Will continue with our plan to reduce Micardis.  I encouraged hydration and we will follow-up a basic metabolic panel next week.  Though he has chronic lower extremity edema, we may need to adjust his diuretic dosing.  4.  Hyperkalemia: Mild at 5.2.  Reducing ARB dose.  Follow-up repeat labs early next week.  5.  Cough: Patient has been coughing over the past few days with thick mucus.  He has not had any fevers but did have chills last night.  He is afebrile today and lungs are clear.  Follow-up PA and lateral chest x-ray today.  6.  Primary hypertension: See #2.  Reducing Micardis dose.  7.  Chronic HFpEF: Continues to have bilateral woody edema with relatively stable weight.  He is currently on torsemide  20 mg daily, with mild BUN and creatinine elevation in the setting of soft blood pressures.  Adjusting ARB dose as above.  Follow-up basic metabolic panel next week.  May need further diuretic adjustment.  8.  Persistent atrial flutter: Status post successful cardioversion earlier this year.  Rhythm is regular with normal rate on exam.  He remains on amiodarone  200 mg daily and he is anticoagulated with Eliquis .  9.  Disposition: Follow-up chest x-ray.  Follow-up repeat basic metabolic panel next week.  Office follow-up in 1 month or sooner if necessary.  Laneta Pintos, NP 04/27/2024, 5:35 PM

## 2024-04-30 ENCOUNTER — Other Ambulatory Visit: Payer: Self-pay | Admitting: *Deleted

## 2024-04-30 DIAGNOSIS — N183 Chronic kidney disease, stage 3 unspecified: Secondary | ICD-10-CM

## 2024-05-04 ENCOUNTER — Other Ambulatory Visit: Payer: Self-pay | Admitting: *Deleted

## 2024-05-04 LAB — BASIC METABOLIC PANEL WITH GFR
BUN/Creatinine Ratio: 21 (ref 10–24)
BUN: 36 mg/dL — ABNORMAL HIGH (ref 8–27)
CO2: 24 mmol/L (ref 20–29)
Calcium: 9 mg/dL (ref 8.6–10.2)
Chloride: 100 mmol/L (ref 96–106)
Creatinine, Ser: 1.74 mg/dL — ABNORMAL HIGH (ref 0.76–1.27)
Glucose: 121 mg/dL — ABNORMAL HIGH (ref 70–99)
Potassium: 5 mmol/L (ref 3.5–5.2)
Sodium: 141 mmol/L (ref 134–144)
eGFR: 41 mL/min/{1.73_m2} — ABNORMAL LOW (ref >59)

## 2024-05-04 MED ORDER — AZITHROMYCIN 250 MG PO TABS: ORAL_TABLET | ORAL | 0 refills | Status: DC

## 2024-05-04 MED ORDER — AMOXICILLIN-POT CLAVULANATE 875-125 MG PO TABS: 1.0000 | ORAL_TABLET | Freq: Two times a day (BID) | ORAL | 0 refills | Status: AC

## 2024-05-15 ENCOUNTER — Encounter (INDEPENDENT_AMBULATORY_CARE_PROVIDER_SITE_OTHER): Payer: Self-pay

## 2024-05-22 ENCOUNTER — Encounter: Payer: Self-pay | Admitting: Podiatry

## 2024-05-22 ENCOUNTER — Ambulatory Visit (INDEPENDENT_AMBULATORY_CARE_PROVIDER_SITE_OTHER): Payer: Medicare Other | Admitting: Podiatry

## 2024-05-22 DIAGNOSIS — M79675 Pain in left toe(s): Secondary | ICD-10-CM

## 2024-05-22 DIAGNOSIS — B351 Tinea unguium: Secondary | ICD-10-CM | POA: Diagnosis not present

## 2024-05-22 DIAGNOSIS — E1142 Type 2 diabetes mellitus with diabetic polyneuropathy: Secondary | ICD-10-CM

## 2024-05-22 DIAGNOSIS — M79674 Pain in right toe(s): Secondary | ICD-10-CM | POA: Diagnosis not present

## 2024-05-22 NOTE — Progress Notes (Signed)
This patient returns to my office for at risk foot care.  This patient requires this care by a professional since this patient will be at risk due to having  diabetes.  This patient is unable to cut nails himself since the patient cannot reach his nails.These nails are painful walking and wearing shoes.  This patient presents for at risk foot care today.  General Appearance  Alert, conversant and in no acute stress.  Vascular  Dorsalis pedis and posterior tibial  pulses are palpable  bilaterally.  Capillary return is within normal limits  bilaterally. Temperature is within normal limits  bilaterally.  Neurologic  Senn-Weinstein monofilament wire test within normal limits  bilaterally. Muscle power within normal limits bilaterally.  Nails Thick disfigured discolored nails with subungual debris  from hallux to fifth toes bilaterally. No evidence of bacterial infection or drainage bilaterally.  Orthopedic  No limitations of motion  feet .  No crepitus or effusions noted.  No bony pathology or digital deformities noted.  Skin  normotropic skin with no porokeratosis noted bilaterally.  No signs of infections or ulcers noted.     Onychomycosis  Pain in right toes  Pain in left toes  Consent was obtained for treatment procedures.   Mechanical debridement of nails 1-5  bilaterally performed with a nail nipper.  Filed with dremel without incident.    Return office visit   3 months                   Told patient to return for periodic foot care and evaluation due to potential at risk complications.   Nicholi Ghuman DPM   

## 2024-06-11 ENCOUNTER — Encounter: Payer: Self-pay | Admitting: Family Medicine

## 2024-06-14 ENCOUNTER — Ambulatory Visit: Attending: Nurse Practitioner | Admitting: Nurse Practitioner

## 2024-06-14 NOTE — Progress Notes (Deleted)
 Office Visit    Patient Name: Jerry Parsons Date of Encounter: 06/14/2024  Primary Care Provider:  Nikki Barters, MD Primary Cardiologist:  Belva Boyden, MD  Chief Complaint    73 y.o. male with a history of persistent atrial flutter on amiodarone  and Eliquis , diabetes, hypertension, hyperlipidemia, obesity, sleep apnea, stage II-III chronic kidney disease, depression, chronic back pain, spinal stenosis, osteoarthritis, TIA, and peripheral neuropathy, who present for f/u related to ***  Past Medical History   Subjective   Past Medical History:  Diagnosis Date   Arthritis    Atrial flutter (HCC)    a. 12/2023 s/p DCCV.   Blind right eye    BPH (benign prostatic hyperplasia)    Chest pain    a. 03/2024 MV: EF 56%, mild cor Ca2+. No ischemia/infarct.   Chronic heart failure with preserved ejection fraction (HFpEF) (HCC)    a. 01/2024 Echo: EF 60-65%, no rwma, nl RV fxn, mildly dil LA, mild MR, AoV sclerosis w/o stenosis.   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    x2   GERD (gastroesophageal reflux disease)    Headache    h/o as a child   Hypercholesteremia    Hypertension    Kidney failure    acute   Neuromuscular disorder (HCC)    Neuropathy    Pneumonia    time 9 ;last episode 12/2015   Pulmonary embolus (HCC) 2011   Seasonal allergies    Seizures (HCC)    as child    Sleep apnea    BIPAP   Stiff neck    limited turning s/p titanium plate placement   TIA (transient ischemic attack)    Wears dentures    full upper and lower   Past Surgical History:  Procedure Laterality Date   BACK SURGERY     x 8; upper x 3 & lower x 5   CARDIOVERSION  03/14/13, 10/16   2014 - [facility], 2016 - [place]   CARDIOVERSION N/A 09/11/2020   Procedure: CARDIOVERSION;  Surgeon: Devorah Fonder, MD;  Location: [facility];  Service:  Cardiovascular;  Laterality: N/A;   CARDIOVERSION N/A 01/25/2024   Procedure: CARDIOVERSION;  Surgeon: Devorah Fonder, MD;  Location: [facility];  Service: Cardiovascular;  Laterality: N/A;   CATARACT EXTRACTION W/PHACO Left 10/29/2015   Procedure: CATARACT EXTRACTION PHACO AND INTRAOCULAR LENS PLACEMENT (IOC);  Surgeon: Annell Kidney, MD;  Location: [facility];  Service: Ophthalmology;  Laterality: Left;  DIABETIC - insulin  and oral medsSleep apnea - no machine   CATARACT EXTRACTION W/PHACO Right 02/16/2023   Procedure: CATARACT EXTRACTION PHACO AND INTRAOCULAR LENS PLACEMENT (IOC) RIGHT DIABETIC MALYUGIN HEALON 5 VISION BLUE  65.52  03:57.7;  Surgeon: Annell Kidney, MD;  Location: [facility];  Service: Ophthalmology;  Laterality: Right;  Diabetic   CHOLECYSTECTOMY     COLONOSCOPY WITH PROPOFOL  N/A 01/16/2018   Procedure: COLONOSCOPY WITH PROPOFOL ;  Surgeon: Cassie Click, MD;  Location: [facility];  Service: Endoscopy;  Laterality: N/A;   ESOPHAGOGASTRODUODENOSCOPY (EGD) WITH PROPOFOL  N/A 01/16/2018   Procedure: ESOPHAGOGASTRODUODENOSCOPY (EGD) WITH PROPOFOL ;  Surgeon: Cassie Click, MD;  Location: [facility] END[facility];  Service: Endoscopy;  Laterality: N/A;   EYE SURGERY     GALLBLADDER SURGERY  2002   JOINT REPLACEMENT Right 2018  KNEE ARTHROSCOPY     left    PARATHYROIDECTOMY N/A 07/25/2020   Procedure: PARATHYROIDECTOMY;  Surgeon: Mercy Stall, MD;  Location: [facility];  Service: General;  Laterality: N/A;   ROTATOR CUFF REPAIR  2001   left    TEE WITHOUT CARDIOVERSION N/A 04/26/2018   Procedure: TRANSESOPHAGEAL ECHOCARDIOGRAM (TEE);  Surgeon: Sammy Crisp, MD;  Location: [facility];  Service: Cardiovascular;  Laterality: N/A;   TONSILLECTOMY     TOTAL KNEE ARTHROPLASTY Right 06/20/2017   Procedure: RIGHT TOTAL KNEE ARTHROPLASTY;  Surgeon: Liliane Rei, MD;  Location: [facility];  Service: Orthopedics;  Laterality: Right;    Allergies  Allergies   Allergen Reactions   Carisoprodol Itching and Nausea And Vomiting    Reaction:  Unknown   Sensitivity   Reaction:  Unknown     Reaction:  Unknown     Sensitivity     Reaction:  Unknown       History of Present Illness      73 y.o. y/o male with a history of persistent atrial flutter, diabetes, hypertension, hyperlipidemia, obesity, sleep apnea, stage II-III chronic kidney disease, depression, chronic back pain, spinal stenosis, osteoarthritis, TIA, and peripheral neuropathy.  He was initially diagnosed with atrial flutter in March 2014, and subsequently underwent cardioversion.  He was hospitalized in June 2021 with recurrent atrial flutter and required cardioversion in September 2021.  Echocardiogram at that time showed an EF of 60-65% with mild LVH, and moderately dilated left atrium.  He has since been managed with amiodarone  and Eliquis .  In October 2024, he was admitted with cough, congestion, and community-acquired pneumonia.  ECG notable for rate controlled atrial flutter.  CTA of the chest was negative for PE, but did show bilateral lower lobe and right middle lobe airspace opacities concerning for edema versus pneumonia.  He was treated with intravenous antibiotics and subsequently discharged in atrial flutter with subsequent 20 pound weight gain and development of significant lower extremity and scrotal edema.  Outpatient diuretics were adjusted in December 2024 with resultant rise in creatinine necessitating reduction of torsemide  to 20 mg daily.  Amiodarone  was increased to 200 mg twice daily in December 2024 in the setting of ongoing atrial flutter with plan for cardioversion however, patient canceled multiple appointments and cardioversion was delayed until late January 2025.  A post cardioversion echocardiogram showed normal LV function with mildly dilated left atrium and aortic valve sclerosis.  Mr. Winegarden was seen in clinic in mid April 2025 with report of severe chest pain and  he was referred to the emergency department.  There, troponins were normal.  CT angiogram of the chest was negative for PE though coronary calcium  was noted.  Lower extremity ultrasound was negative for DVT.  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  He did have AKI with a BUN/creatinine of 44/2.32 prompting discontinuation of spironolactone and changing diuretic to torsemide  20 mg every other day.  A Lexiscan  Myoview  was performed which showed normal LV function, mild coronary calcification, and no ischemia or infarct, and he was discharged home.     Mr. Uno was last seen in cardiology clinic in early May 2025 at which time he complained of productive cough with thick mucus, reminiscent of times when he had pneumonia.  He was also mildly hypotensive and complained of orthostasic lightheadedness at home.  Follow-up labs showed a potassium of 5.2.  Micardis  was reduced to 20 mg  daily.  Chest x-ray was performed and showed central bronchial thickening as well as minimal ill-defined patchy opacity in the lung bases-infection versus atelectasis.  Recommendation was made for initiation of oral Augmentin  and azithromycin  for ongoing symptoms.  Most recent lab work May 03, 2024 showed stable potassium of 5.0 and stable renal function with a BUN and creatinine of 36 and 1.74. Objective   Home Medications    Current Outpatient Medications  Medication Sig Dispense Refill   amiodarone  (PACERONE ) 200 MG tablet Take 1 tablet (200 mg total) by mouth daily. 90 tablet 3   atorvastatin  (LIPITOR) 40 MG tablet Take 1 tablet (40 mg total) by mouth daily. 30 tablet 2   azithromycin  (ZITHROMAX  Z-PAK) 250 MG tablet Take 2 tablets the first day and then 1 tablet daily for four days. 6 each 0   cholecalciferol  (VITAMIN D3) 25 MCG (1000 UNIT) tablet Take 1,000 Units by mouth 2 (two) times daily.     Continuous Blood Gluc Receiver  (FREESTYLE LIBRE 2 READER) DEVI Inject 1 Device into the skin daily. DGX 1 each 3   Continuous Blood Gluc Sensor (FREESTYLE LIBRE 2 SENSOR) MISC 2 Devices by Does not apply route daily. 2 each 0   cyclobenzaprine  (FLEXERIL ) 10 MG tablet Take 10 mg by mouth at bedtime.     docusate sodium  (COLACE) 100 MG capsule Take 100 mg by mouth daily.     dutasteride  (AVODART ) 0.5 MG capsule Take 0.5 mg by mouth daily.     ELIQUIS  5 MG TABS tablet TAKE 1 TABLET TWICE A DAY 180 tablet 1   FARXIGA  10 MG TABS tablet TAKE 1 TABLET BY MOUTH EVERY DAY 90 tablet 3   fexofenadine (ALLEGRA) 180 MG tablet Take 180 mg by mouth daily.     Finerenone (KERENDIA) 20 MG TABS daily at 6 (six) AM.     gabapentin  (NEURONTIN ) 100 MG capsule Take 100 mg by mouth 2 (two) times daily. 100 mg in the morning, 100 mg 3p     gabapentin  (NEURONTIN ) 300 MG capsule Take 1 capsule (300 mg total) by mouth at bedtime. 90 capsule 1   Insulin  Glargine (BASAGLAR  KWIKPEN) 100 UNIT/ML INJECT 40 UNITS            SUBCUTANEOUSLY TWO TIMES A DAY (Patient taking differently: Inject 44 Units into the skin 2 (two) times daily.) 45 mL 1   magnesium  oxide (MAG-OX) 400 (240 Mg) MG tablet Take 1 tablet by mouth 2 (two) times daily.     metoprolol  tartrate (LOPRESSOR ) 50 MG tablet Take 50 mg by mouth 2 (two) times daily.     naloxone  (NARCAN ) nasal spray 4 mg/0.1 mL Please use in the event of overdose from narcotic medication 1 each 1   nortriptyline  (PAMELOR ) 10 MG capsule 30 mg at bedtime.     NOVOLOG  FLEXPEN 100 UNIT/ML FlexPen INJECT 14 UNITS            SUBCUTANEOUSLY IN THE      MORNING AND AT BEDTIME 15 mL 6   Omega-3 Fatty Acids (FISH OIL) 1200 MG CAPS Take 2,400 mg by mouth 2 (two) times daily.     ONETOUCH ULTRA test strip USE WITH METER TWICE DAILY 100 strip 6   oxyCODONE  (OXYCONTIN ) 40 mg 12 hr tablet Take 1 tablet (40 mg total) by mouth every 8 (eight) hours as needed. (Patient taking differently: Take 40 mg by mouth 3 (three) times daily.) 90  tablet 0   polyethylene glycol (MIRALAX  / GLYCOLAX ) 17  g packet Take 17 g by mouth daily.     pregabalin  (LYRICA ) 200 MG capsule TAKE 1 CAPSULE 3 TIMES A   DAY 270 capsule 1   rOPINIRole  (REQUIP ) 1 MG tablet TAKE 1 TO 2 TABLETS BY MOUTH AT BEDTIME AS NEEDED (Patient taking differently: Take 1 mg by mouth 2 (two) times daily. 1 mg in the morning, 1 mg at 3p) 180 tablet 1   rOPINIRole  (REQUIP ) 2 MG tablet TAKE 1 TABLET BY MOUTH TWICE A DAY AS NEEDED (Patient taking differently: Take 2 mg by mouth at bedtime.) 180 tablet 0   sitaGLIPtin  (JANUVIA ) 100 MG tablet Take by mouth.     tamsulosin  (FLOMAX ) 0.4 MG CAPS capsule Take 0.4 mg by mouth daily.     telmisartan  (MICARDIS ) 20 MG tablet Take 1 tablet (20 mg total) by mouth daily. 90 tablet 3   torsemide  (DEMADEX ) 20 MG tablet Take 1 tablet (20 mg total) by mouth every other day. Take one tablet as needed for a weight gain of 2 lbs in 24 hrs or 5 lbs in a week.     No current facility-administered medications for this visit.     Physical Exam    VS:  There were no vitals taken for this visit. , BMI There is no height or weight on file to calculate BMI.       GEN: Well nourished, well developed, in no acute distress. HEENT: normal. Neck: Supple, no JVD, carotid bruits, or masses. Cardiac: RRR, no murmurs, rubs, or gallops. No clubbing, cyanosis, edema.  Radials 2+/PT 2+ and equal bilaterally.  Respiratory:  Respirations regular and unlabored, clear to auscultation bilaterally. GI: Soft, nontender, nondistended, BS + x 4. MS: no deformity or atrophy. Skin: warm and dry, no rash. Neuro:  Strength and sensation are intact. Psych: Normal affect.  Accessory Clinical Findings    ECG personally reviewed by me today -    *** - no acute changes.  Lab Results  Component Value Date   WBC 5.1 04/14/2024   HGB 12.0 (L) 04/14/2024   HCT 38.4 (L) 04/14/2024   MCV 86.5 04/14/2024   PLT 87 (L) 04/14/2024   Lab Results  Component Value Date    CREATININE 1.74 (H) 05/03/2024   BUN 36 (H) 05/03/2024   NA 141 05/03/2024   K 5.0 05/03/2024   CL 100 05/03/2024   CO2 24 05/03/2024   Lab Results  Component Value Date   ALT 19 05/27/2023   AST 21 05/27/2023   ALKPHOS 75 05/27/2023   BILITOT 0.6 05/27/2023   Lab Results  Component Value Date   CHOL 163 04/13/2024   HDL 27 (L) 04/13/2024   LDLCALC 97 04/13/2024   TRIG 196 (H) 04/13/2024   CHOLHDL 6.0 04/13/2024    Lab Results  Component Value Date   HGBA1C 8.8 (H) 04/12/2024   Lab Results  Component Value Date   TSH 6.660 (H) 06/08/2022       Assessment & Plan    1.  ***  Laneta Pintos, NP 06/14/2024, 1:53 PM

## 2024-06-15 ENCOUNTER — Ambulatory Visit: Admitting: Nurse Practitioner

## 2024-06-25 ENCOUNTER — Telehealth: Payer: Self-pay | Admitting: Emergency Medicine

## 2024-06-25 NOTE — Telephone Encounter (Signed)
 Called patient and left message for call back.   Can we call Mr. Demetriou to find out if he is having symptoms concerning for arrhythmia He does have a history of atrial fibrillation If having symptoms concerning for paroxysmal atrial fibrillation, 14 day event monitor could be ordered Thx TGollan       Previous Messages    ----- Message ----- From: Douglas Rule, MD Sent: 06/21/2024   3:54 PM EDT To: Evalene JINNY Lunger, MD  I saw our mutual patient today. Patient is giving a history that is concerning for an arrythmia. I was hoping you could get him in and see about an event monitor. I did make a few medication changes to see if helps. I stopped his amlodipine and nortriptyline . I lowered the dose of his Kerendia.  Thanks  SCK

## 2024-06-26 ENCOUNTER — Other Ambulatory Visit: Payer: Self-pay | Admitting: Emergency Medicine

## 2024-06-26 ENCOUNTER — Ambulatory Visit: Attending: Cardiovascular Disease

## 2024-06-26 DIAGNOSIS — I480 Paroxysmal atrial fibrillation: Secondary | ICD-10-CM

## 2024-06-26 NOTE — Telephone Encounter (Signed)
 Called and spoke with patient. Patient reports that he had 2 episodes of palpitations on 06/24/24. Patient would like to wear heart monitor. Order placed for heart monitor. Verbal directions given to patient.

## 2024-07-04 ENCOUNTER — Ambulatory Visit: Attending: Nurse Practitioner | Admitting: Nurse Practitioner

## 2024-07-04 ENCOUNTER — Encounter: Payer: Self-pay | Admitting: Nurse Practitioner

## 2024-07-04 VITALS — BP 118/58 | HR 60 | Ht 70.0 in | Wt 266.2 lb

## 2024-07-04 DIAGNOSIS — E1142 Type 2 diabetes mellitus with diabetic polyneuropathy: Secondary | ICD-10-CM | POA: Insufficient documentation

## 2024-07-04 DIAGNOSIS — I483 Typical atrial flutter: Secondary | ICD-10-CM | POA: Insufficient documentation

## 2024-07-04 DIAGNOSIS — Z794 Long term (current) use of insulin: Secondary | ICD-10-CM | POA: Diagnosis present

## 2024-07-04 DIAGNOSIS — R072 Precordial pain: Secondary | ICD-10-CM | POA: Insufficient documentation

## 2024-07-04 DIAGNOSIS — I5032 Chronic diastolic (congestive) heart failure: Secondary | ICD-10-CM | POA: Insufficient documentation

## 2024-07-04 DIAGNOSIS — E875 Hyperkalemia: Secondary | ICD-10-CM | POA: Diagnosis present

## 2024-07-04 DIAGNOSIS — I2510 Atherosclerotic heart disease of native coronary artery without angina pectoris: Secondary | ICD-10-CM | POA: Insufficient documentation

## 2024-07-04 DIAGNOSIS — I10 Essential (primary) hypertension: Secondary | ICD-10-CM | POA: Insufficient documentation

## 2024-07-04 DIAGNOSIS — N183 Chronic kidney disease, stage 3 unspecified: Secondary | ICD-10-CM | POA: Diagnosis present

## 2024-07-04 DIAGNOSIS — R55 Syncope and collapse: Secondary | ICD-10-CM | POA: Insufficient documentation

## 2024-07-04 NOTE — Progress Notes (Signed)
 Office Visit    Patient Name: Jerry Parsons Date of Encounter: 07/04/2024  Primary Care Provider:  Bertrum Charlie CROME, MD Primary Cardiologist:  Jerry Lunger, MD  Cardiology APP:  Jerry Lonni Ingle, NP   Chief Complaint    73 y.o. male with a history of persistent atrial flutter on amiodarone  and Eliquis , diabetes, hypertension, hyperlipidemia, obesity, sleep apnea, stage II-III chronic kidney disease, depression, chronic back pain, spinal stenosis, osteoarthritis, TIA, and peripheral neuropathy, who present for f/u related to presyncope.  Past Medical History   Subjective   Past Medical History:  Diagnosis Date   Arthritis    Atrial flutter (HCC)    a. 12/2023 s/p DCCV.   Blind right eye    BPH (benign prostatic hyperplasia)    Chest pain    a. 03/2024 MV: EF 56%, mild cor Ca2+. No ischemia/infarct.   Chronic heart failure with preserved ejection fraction (HFpEF) (HCC)    a. 01/2024 Echo: EF 60-65%, no rwma, nl RV fxn, mildly dil LA, mild MR, AoV sclerosis w/o stenosis.   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    x2   GERD (gastroesophageal reflux disease)    Headache    h/o as a child   Hypercholesteremia    Hypertension    Kidney failure    acute   Neuromuscular disorder (HCC)    Neuropathy    Pneumonia    time 9 ;last episode 12/2015   Pulmonary embolus (HCC) 2011   Seasonal allergies    Seizures (HCC)    as child    Sleep apnea    BIPAP   Stiff neck    limited turning s/p titanium plate placement   TIA (transient ischemic attack)    Wears dentures    full upper and lower   Past Surgical History:  Procedure Laterality Date   BACK SURGERY     x 8; upper x 3 & lower x 5   CARDIOVERSION  03/14/13, 10/16   2014 - [facility], 2016 - [facility]   CARDIOVERSION N/A 09/11/2020   Procedure: CARDIOVERSION;   Surgeon: Parsons Jerry PARAS, MD;  Location: [facility];  Service: Cardiovascular;  Laterality: N/A;   CARDIOVERSION N/A 01/25/2024   Procedure: CARDIOVERSION;  Surgeon: Parsons Jerry PARAS, MD;  Location: [facility];  Service: Cardiovascular;  Laterality: N/A;   CATARACT EXTRACTION W/PHACO Left 10/29/2015   Procedure: CATARACT EXTRACTION PHACO AND INTRAOCULAR LENS PLACEMENT (IOC);  Surgeon: Dene Etienne, MD;  Location: [facility];  Service: Ophthalmology;  Laterality: Left;  DIABETIC - insulin  and oral medsSleep apnea - no machine   CATARACT EXTRACTION W/PHACO Right 02/16/2023   Procedure: CATARACT EXTRACTION PHACO AND INTRAOCULAR LENS PLACEMENT (IOC) RIGHT DIABETIC MALYUGIN HEALON 5 VISION BLUE  65.52  03:57.7;  Surgeon: Etienne Dene, MD;  Location: [facility];  Service: Ophthalmology;  Laterality: Right;  Diabetic   CHOLECYSTECTOMY     COLONOSCOPY WITH PROPOFOL  N/A 01/16/2018   Procedure: COLONOSCOPY WITH PROPOFOL ;  Surgeon: Viktoria Lamar DASEN, MD;  Location: [facility];  Service: Endoscopy;  Laterality: N/A;   ESOPHAGOGASTRODUODENOSCOPY (EGD) WITH PROPOFOL  N/A 01/16/2018   Procedure: ESOPHAGOGASTRODUODENOSCOPY (EGD) WITH PROPOFOL ;  Surgeon: Viktoria Lamar DASEN, MD;  Location: [facility];  Service: Endoscopy;  Laterality: N/A;   EYE SURGERY     GALLBLADDER SURGERY  2002   JOINT REPLACEMENT Right 2018   KNEE ARTHROSCOPY     left    PARATHYROIDECTOMY N/A 07/25/2020   Procedure: PARATHYROIDECTOMY;  Surgeon: Marolyn Nest, MD;  Location: [facility];  Service: General;  Laterality: N/A;   ROTATOR CUFF REPAIR  2001   left    TEE WITHOUT CARDIOVERSION N/A 04/26/2018   Procedure: TRANSESOPHAGEAL ECHOCARDIOGRAM (TEE);  Surgeon: Mady Bruckner, MD;  Location: [facility];  Service: Cardiovascular;  Laterality: N/A;   TONSILLECTOMY     TOTAL KNEE ARTHROPLASTY Right 06/20/2017   Procedure: RIGHT TOTAL KNEE ARTHROPLASTY;  Surgeon: Melodi Lerner, MD;  Location: [facility];  Service:  Orthopedics;  Laterality: Right;    Allergies  Allergies  Allergen Reactions   Carisoprodol Itching and Nausea And Vomiting    Reaction:  Unknown   Sensitivity   Reaction:  Unknown     Reaction:  Unknown     Sensitivity     Reaction:  Unknown       History of Present Illness      73 y.o. y/o male with a history of persistent atrial flutter, diabetes, hypertension, hyperlipidemia, obesity, sleep apnea, stage II-III chronic kidney disease, depression, chronic back pain, spinal stenosis, osteoarthritis, TIA, and peripheral neuropathy.  He was initially diagnosed with atrial flutter in March 2014, and subsequently underwent cardioversion.  He was hospitalized in June 2021 with recurrent atrial flutter and required cardioversion in September 2021.  Echocardiogram at that time showed an EF of 60-65% with mild LVH, and moderately dilated left atrium.  He has since been managed with amiodarone  and Eliquis .  In October 2024, he was admitted with cough, congestion, and community-acquired pneumonia.  ECG notable for rate controlled atrial flutter.  CTA of the chest was negative for PE, but did show bilateral lower lobe and right middle lobe airspace opacities concerning for edema versus pneumonia.  He was treated with intravenous antibiotics and subsequently discharged in atrial flutter with subsequent 20 pound weight gain and development of significant lower extremity and scrotal edema.  Outpatient diuretics were adjusted in December 2024 with resultant rise in creatinine necessitating reduction of torsemide  to 20 mg daily.  Amiodarone  was increased to 200 mg twice daily in December 2024 in the setting of ongoing atrial flutter with plan for cardioversion however, patient canceled multiple appointments and cardioversion was delayed until late January 2025.  A post cardioversion echocardiogram showed normal LV function with mildly dilated left atrium and aortic valve sclerosis.  [name] was seen in  clinic in mid April 2025 with report of severe chest pain and he was referred to the emergency department.  There, troponins were normal.  CT angiogram of the chest was negative for PE though coronary calcium  was noted.  Lower extremity ultrasound was negative for DVT.  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  He did have AKI with a BUN/creatinine of 44/2.32 prompting discontinuation of spironolactone and changing diuretic to torsemide  20 mg every other day.  A Lexiscan  Myoview  was performed which showed normal LV function, mild coronary calcification, and no ischemia or infarct, and he was discharged home.       Mr. Hohmann was last seen in cardiology clinic in early May 2025 at which time he complained of productive cough with thick mucus, reminiscent of times when he had pneumonia.  He was also mildly hypotensive and complained of orthostasic lightheadedness at home.  Follow-up labs showed  a potassium of 5.2.  Micardis  was reduced to 20 mg daily.  Chest x-ray was performed and showed central bronchial thickening as well as minimal ill-defined patchy opacity in the lung bases-infection versus atelectasis.  Recommendation was made for initiation of oral Augmentin  and azithromycin  for ongoing symptoms.  Most recent lab work June 19, 2024 showed stable potassium of 5.1 and stable renal function with a BUN and creatinine of 33 and creat 1.66.  In the setting of ongoing lower ext edema, his nephrologist d/c'd amlodipine on 06/21/2024.  Patient also reported spells of presyncope and our office was contacted, Zio ordered, and appointment made for today.  They just received the Zio in the mail last night and have not placed yet.  Patient says that after his last appointment here in May, his respiratory symptoms improved with antibiotic therapy but then beginning about 3 weeks ago, he started noticing spells of sudden onset of  lightheadedness that could occur while sitting or standing, lasting a few seconds, resolving spontaneously.  He has never lost consciousness as a result of the spells.  He has had 5 spells in the past week but the episodes were occurring daily initially.  He denies chest pain, dyspnea, palpitations, PND, orthopnea, or early satiety.  He has persistent lower extremity swelling despite stopping amlodipine. Objective   Home Medications    Current Outpatient Medications  Medication Sig Dispense Refill   amiodarone  (PACERONE ) 200 MG tablet Take 1 tablet (200 mg total) by mouth daily. 90 tablet 3   atorvastatin  (LIPITOR) 40 MG tablet Take 1 tablet (40 mg total) by mouth daily. 30 tablet 2   cholecalciferol  (VITAMIN D3) 25 MCG (1000 UNIT) tablet Take 1,000 Units by mouth 2 (two) times daily.     Continuous Blood Gluc Receiver (FREESTYLE LIBRE 2 READER) DEVI Inject 1 Device into the skin daily. DGX 1 each 3   Continuous Blood Gluc Sensor (FREESTYLE LIBRE 2 SENSOR) MISC 2 Devices by Does not apply route daily. 2 each 0   cyclobenzaprine  (FLEXERIL ) 10 MG tablet Take 10 mg by mouth at bedtime.     docusate sodium  (COLACE) 100 MG capsule Take 100 mg by mouth daily.     dutasteride  (AVODART ) 0.5 MG capsule Take 0.5 mg by mouth daily.     ELIQUIS  5 MG TABS tablet TAKE 1 TABLET TWICE A DAY 180 tablet 1   FARXIGA  10 MG TABS tablet TAKE 1 TABLET BY MOUTH EVERY DAY 90 tablet 3   fexofenadine (ALLEGRA) 180 MG tablet Take 180 mg by mouth daily.     Finerenone 10 MG TABS Take 10 mg by mouth daily at 6 (six) AM.     gabapentin  (NEURONTIN ) 100 MG capsule Take 100 mg by mouth 2 (two) times daily. 100 mg in the morning, 100 mg 3p     gabapentin  (NEURONTIN ) 300 MG capsule Take 1 capsule (300 mg total) by mouth at bedtime. 90 capsule 1   Insulin  Glargine (BASAGLAR  KWIKPEN) 100 UNIT/ML INJECT 40 UNITS            SUBCUTANEOUSLY TWO TIMES A DAY (Patient taking differently: Inject 44 Units into the skin 2 (two) times daily.)  45 mL 1   magnesium  oxide (MAG-OX) 400 (240 Mg) MG tablet Take 1 tablet by mouth 2 (two) times daily.     metoprolol  tartrate (LOPRESSOR ) 50 MG tablet Take 50 mg by mouth 2 (two) times daily.     naloxone  (NARCAN ) nasal spray 4 mg/0.1 mL Please use in the  event of overdose from narcotic medication 1 each 1   NOVOLOG  FLEXPEN 100 UNIT/ML FlexPen INJECT 14 UNITS            SUBCUTANEOUSLY IN THE      MORNING AND AT BEDTIME 15 mL 6   Omega-3 Fatty Acids (FISH OIL) 1200 MG CAPS Take 2,400 mg by mouth 2 (two) times daily.     ONETOUCH ULTRA test strip USE WITH METER TWICE DAILY 100 strip 6   oxyCODONE  (OXYCONTIN ) 40 mg 12 hr tablet Take 1 tablet (40 mg total) by mouth every 8 (eight) hours as needed. (Patient taking differently: Take 40 mg by mouth 3 (three) times daily.) 90 tablet 0   polyethylene glycol (MIRALAX  / GLYCOLAX ) 17 g packet Take 17 g by mouth daily.     pregabalin  (LYRICA ) 200 MG capsule TAKE 1 CAPSULE 3 TIMES A   DAY 270 capsule 1   rOPINIRole  (REQUIP ) 1 MG tablet TAKE 1 TO 2 TABLETS BY MOUTH AT BEDTIME AS NEEDED (Patient taking differently: Take 1 mg by mouth 2 (two) times daily. 1 mg in the morning, 1 mg at 3p) 180 tablet 1   rOPINIRole  (REQUIP ) 2 MG tablet TAKE 1 TABLET BY MOUTH TWICE A DAY AS NEEDED (Patient taking differently: Take 2 mg by mouth at bedtime.) 180 tablet 0   sitaGLIPtin  (JANUVIA ) 100 MG tablet Take by mouth.     tamsulosin  (FLOMAX ) 0.4 MG CAPS capsule Take 0.4 mg by mouth daily.     telmisartan  (MICARDIS ) 20 MG tablet Take 1 tablet (20 mg total) by mouth daily. 90 tablet 3   torsemide  (DEMADEX ) 20 MG tablet Take 1 tablet (20 mg total) by mouth every other day. Take one tablet as needed for a weight gain of 2 lbs in 24 hrs or 5 lbs in a week.     azithromycin  (ZITHROMAX  Z-PAK) 250 MG tablet Take 2 tablets the first day and then 1 tablet daily for four days. 6 each 0   No current facility-administered medications for this visit.     Physical Exam    VS:  BP (!) 118/58  (BP Location: Left Arm, Cuff Size: Normal)   Pulse 60   Ht 5' 10 (1.778 m)   Wt 266 lb 3.2 oz (120.7 kg)   SpO2 94%   BMI 38.20 kg/m  , BMI Body mass index is 38.2 kg/m. Orthostatic VS for the past 24 hrs (Last 3 readings):  BP- Lying BP- Sitting Pulse- Sitting BP- Standing at 0 minutes BP- Standing at 3 minutes  07/04/24 1531 120/54 128/52 60 120/58 118/58          GEN: Well nourished, well developed, in no acute distress. HEENT: normal. Neck: Supple, no JVD, carotid bruits, or masses. Cardiac: RRR, 2/6 systolic murmur heard throughout.  No rubs or gallops. No clubbing, cyanosis, 2+ bilateral lower extremity woody edema to the knees.  Radials 2+/PT 2+ and equal bilaterally.  Respiratory:  Respirations regular and unlabored, clear to auscultation bilaterally. GI: Soft, nontender, nondistended, BS + x 4. MS: no deformity or atrophy. Skin: warm and dry, no rash. Neuro:  Strength and sensation are intact. Psych: Normal affect.  Accessory Clinical Findings    ECG personally reviewed by me today - EKG Interpretation Date/Time:  Wednesday July 04 2024 14:37:02 EDT Ventricular Rate:  67 PR Interval:  152 QRS Duration:  118 QT Interval:  424 QTC Calculation: 448 R Axis:   46  Text Interpretation: Normal sinus rhythm Incomplete right bundle  branch block Confirmed by Jerry Bruckner [phone]) on 07/04/2024 2:54:07 PM  - no acute changes.  Lab Results  Component Value Date   WBC 5.1 04/14/2024   HGB 12.0 (L) 04/14/2024   HCT 38.4 (L) 04/14/2024   MCV 86.5 04/14/2024   PLT 87 (L) 04/14/2024   Lab Results  Component Value Date   CREATININE 1.74 (H) 05/03/2024   BUN 36 (H) 05/03/2024   NA 141 05/03/2024   K 5.0 05/03/2024   CL 100 05/03/2024   CO2 24 05/03/2024   Lab Results  Component Value Date   ALT 19 05/27/2023   AST 21 05/27/2023   ALKPHOS 75 05/27/2023   BILITOT 0.6 05/27/2023   Lab Results  Component Value Date   CHOL 163 04/13/2024   HDL 27 (L) 04/13/2024    LDLCALC 97 04/13/2024   TRIG 196 (H) 04/13/2024   CHOLHDL 6.0 04/13/2024    Lab Results  Component Value Date   HGBA1C 8.8 (H) 04/12/2024   Lab Results  Component Value Date   TSH 6.660 (H) 06/08/2022       Assessment & Plan    1.  Presyncope: Over the past 3 weeks, patient has been having episodes of presyncope occurring most days and sometimes several times a day, lasting a few seconds, and resolving spontaneously.  Symptoms can occur while standing and often occur while sitting.  He has never lost consciousness.  He was recently seen by nephrology with discontinuation of amlodipine and nortriptyline , and the lowering of finerenone to 10 mg daily.  Since then, patient has had 5 additional episodes of presyncope and our office has ordered a ZIO monitor, which he will place today.  He is not particularly orthostatic on examination.  Please Zio today as planned.  2.  Precordial pain/coronary calcium : Recent low risk stress test following admission for chest pain and normal troponins.  CT of the chest was also negative for PE at that time.  He has not been having any recurrence of chest pain.  He remains on statin and beta-blocker therapy.  No aspirin  in the setting of chronic Eliquis .  3.  Primary hypertension: Patient previously hypotensive in May 2025 with reduction in Micardis  and subsequent discontinuation of amlodipine in June due to ongoing lower extremity swelling and presyncope.  Pressure initially elevated today though stable when evaluated with orthostatic vital signs.  Continue beta-blocker and ARB at current doses.  4.  Chronic HFpEF: Patient with chronic bilateral woody edema.  His weight is actually down 13 pounds since his May 2025 visit.  He remains on torsemide  20 mg every other day with additional 20 mg as needed for weight gain.  Torsemide  is limited by CKD.  Patient also followed by nephrology.  Would benefit from leg elevation and compression.  BP/HR stable.  5.   Persistent atrial flutter: Status post successful cardioversion earlier this year.  Maintaining sinus rhythm on amiodarone .  Anticoagulated with Eliquis .  6.  CKD III/Hyperkalemia: Potassium was 5.2 in May and Micardis  dose was reduced.  Potassium was 5.1 on June 24 through nephrology office.  Creatinine stable at that time.  7.  Disposition: Follow-up 14-day ZIO monitor.  Follow-up in clinic in 4 to 6 weeks or sooner if necessary.  Bruckner Vivienne, NP 07/04/2024, 4:57 PM

## 2024-07-04 NOTE — Patient Instructions (Signed)
 Medication Instructions:  Your physician recommends that you continue on your current medications as directed. Please refer to the Current Medication list given to you today.  *If you need a refill on your cardiac medications before your next appointment, please call your pharmacy*  Lab Work:  No labs ordered today   If you have labs (blood work) drawn today and your tests are completely normal, you will receive your results only by: MyChart Message (if you have MyChart) OR A paper copy in the mail If you have any lab test that is abnormal or we need to change your treatment, we will call you to review the results.  Testing/Procedures:  No test ordered today   Follow-Up: At [facility], you and your health needs are our priority.  As part of our continuing mission to provide you with exceptional heart care, our providers are all part of one team.  This team includes your primary Cardiologist (physician) and Advanced Practice Providers or APPs (Physician Assistants and Nurse Practitioners) who all work together to provide you with the care you need, when you need it.  Your next appointment:   4 - 6 weeks  Provider:   You may see Timothy Gollan, MD or one of the following Advanced Practice Providers on your designated Care Team:   Lonni Meager, NP

## 2024-07-27 DIAGNOSIS — I480 Paroxysmal atrial fibrillation: Secondary | ICD-10-CM | POA: Diagnosis not present

## 2024-07-28 ENCOUNTER — Ambulatory Visit: Payer: Self-pay | Admitting: Cardiovascular Disease

## 2024-07-30 ENCOUNTER — Encounter: Payer: Self-pay | Admitting: Emergency Medicine

## 2024-08-07 ENCOUNTER — Ambulatory Visit: Admitting: Nurse Practitioner

## 2024-08-17 ENCOUNTER — Encounter: Payer: Self-pay | Admitting: Nurse Practitioner

## 2024-08-17 ENCOUNTER — Ambulatory Visit: Attending: Nurse Practitioner | Admitting: Nurse Practitioner

## 2024-08-17 NOTE — Progress Notes (Deleted)
 Office Visit    Patient Name: Jerry Parsons Date of Encounter: 08/17/2024  Primary Care Provider:  Bertrum Charlie CROME, Parsons Primary Cardiologist:  Jerry Lunger, Parsons  Cardiology APP:  Vivienne Lonni Ingle, NP   Chief Complaint    73 y.o. male with a history of persistent atrial flutter on amiodarone  and Eliquis , diabetes, hypertension, hyperlipidemia, obesity, sleep apnea, stage II-III chronic kidney disease, depression, chronic back pain, spinal stenosis, osteoarthritis, TIA, and peripheral neuropathy, who present for f/u related to presyncope.   Past Medical History   Subjective   Past Medical History:  Diagnosis Date   Arthritis    Atrial flutter (HCC)    a. 12/2023 s/p DCCV.   Blind right eye    BPH (benign prostatic hyperplasia)    Chest pain    a. 03/2024 MV: EF 56%, mild cor Ca2+. No ischemia/infarct.   Chronic heart failure with preserved ejection fraction (HFpEF) (HCC)    a. 01/2024 Echo: EF 60-65%, no rwma, nl RV fxn, mildly dil LA, mild MR, AoV sclerosis w/o stenosis.   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    x2   GERD (gastroesophageal reflux disease)    Headache    h/o as a child   Hypercholesteremia    Hypertension    Kidney failure    acute   Neuromuscular disorder (HCC)    Neuropathy    Pneumonia    time 9 ;last episode 12/2015   Pulmonary embolus (HCC) 2011   Seasonal allergies    Seizures (HCC)    as child    Sleep apnea    BIPAP   Stiff neck    limited turning s/p titanium plate placement   TIA (transient ischemic attack)    Wears dentures    full upper and lower   Past Surgical History:  Procedure Laterality Date   BACK SURGERY     x 8; upper x 3 & lower x 5   CARDIOVERSION  03/14/13, 10/16   2014 - [facility], 2016 - [facility]   CARDIOVERSION N/A 09/11/2020   Procedure: CARDIOVERSION;   Surgeon: Jerry Jerry PARAS, Parsons;  Location: [facility];  Service: Cardiovascular;  Laterality: N/A;   CARDIOVERSION N/A 01/25/2024   Procedure: CARDIOVERSION;  Surgeon: Jerry Jerry PARAS, Parsons;  Location: [facility];  Service: Cardiovascular;  Laterality: N/A;   CATARACT EXTRACTION W/PHACO Left 10/29/2015   Procedure: CATARACT EXTRACTION PHACO AND INTRAOCULAR LENS PLACEMENT (IOC);  Surgeon: Jerry Etienne, Parsons;  Location: [facility];  Service: Ophthalmology;  Laterality: Left;  DIABETIC - insulin  and oral medsSleep apnea - no machine   CATARACT EXTRACTION W/PHACO Right 02/16/2023   Procedure: CATARACT EXTRACTION PHACO AND INTRAOCULAR LENS PLACEMENT (IOC) RIGHT DIABETIC MALYUGIN HEALON 5 VISION BLUE  65.52  03:57.7;  Surgeon: Parsons Dene, Parsons;  Location: [facility];  Service: Ophthalmology;  Laterality: Right;  Diabetic   CHOLECYSTECTOMY     COLONOSCOPY WITH PROPOFOL  N/A 01/16/2018   Procedure: COLONOSCOPY WITH PROPOFOL ;  Surgeon: Jerry Jerry Parsons;  Location: [facility] END[facility];  Service: Endoscopy;  Laterality: N/A;   ESOPHAGOGASTRODUODENOSCOPY (EGD) WITH PROPOFOL  N/A 01/16/2018   Procedure: ESOPHAGOGASTRODUODENOSCOPY (EGD) WITH PROPOFOL ;  Surgeon: Jerry Jerry Parsons;  Location: [facility];  Service: Endoscopy;  Laterality: N/A;   EYE SURGERY     GALLBLADDER SURGERY  2002   JOINT REPLACEMENT Right 2018   KNEE ARTHROSCOPY     left    PARATHYROIDECTOMY N/A 07/25/2020   Procedure: PARATHYROIDECTOMY;  Surgeon: Jerry Nest, Parsons;  Location: [facility];  Service: General;  Laterality: N/A;   ROTATOR CUFF REPAIR  2001   left    TEE WITHOUT CARDIOVERSION N/A 04/26/2018   Procedure: TRANSESOPHAGEAL ECHOCARDIOGRAM (TEE);  Surgeon: Jerry Bruckner, Parsons;  Location: [facility];  Service: Cardiovascular;  Laterality: N/A;   TONSILLECTOMY     TOTAL KNEE ARTHROPLASTY Right 06/20/2017   Procedure: RIGHT TOTAL KNEE ARTHROPLASTY;  Surgeon: Jerry Lerner, Parsons;  Location: [facility];  Service:  Orthopedics;  Laterality: Right;    Allergies  Allergies  Allergen Reactions   Carisoprodol Itching and Nausea And Vomiting    Reaction:  Unknown   Sensitivity   Reaction:  Unknown     Reaction:  Unknown     Sensitivity     Reaction:  Unknown       History of Present Illness      73 y.o. y/o male with a history of persistent atrial flutter, diabetes, hypertension, hyperlipidemia, obesity, sleep apnea, stage II-III chronic kidney disease, depression, chronic back pain, spinal stenosis, osteoarthritis, TIA, and peripheral neuropathy.  He was initially diagnosed with atrial flutter in March 2014, and subsequently underwent cardioversion.  He was hospitalized in June 2021 with recurrent atrial flutter and required cardioversion in September 2021.  Echocardiogram at that time showed an EF of 60-65% with mild LVH, and moderately dilated left atrium.  He has since been managed with amiodarone  and Eliquis .  In October 2024, he was admitted with cough, congestion, and community-acquired pneumonia.  ECG notable for rate controlled atrial flutter.  CTA of the chest was negative for PE, but did show bilateral lower lobe and right middle lobe airspace opacities concerning for edema versus pneumonia.  He was treated with intravenous antibiotics and subsequently discharged in atrial flutter with subsequent 20 pound weight gain and development of significant lower extremity and scrotal edema.  Outpatient diuretics were adjusted in December 2024 with resultant rise in creatinine necessitating reduction of torsemide  to 20 mg daily.  Amiodarone  was increased to 200 mg twice daily in December 2024 in the setting of ongoing atrial flutter with plan for cardioversion however, patient canceled multiple appointments and cardioversion was delayed until late January 2025.  A post cardioversion echocardiogram showed normal LV function with mildly dilated left atrium and aortic valve sclerosis.  Jerry Parsons was seen in  clinic in mid April 2025 with report of severe chest pain and he was referred to the emergency department.  There, troponins were normal.  CT angiogram of the chest was negative for PE though coronary calcium  was noted.  Lower extremity ultrasound was negative for DVT.  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  He did have AKI with a BUN/creatinine of 44/2.32 prompting discontinuation of spironolactone and changing diuretic to torsemide  20 mg every other day.  A Lexiscan  Myoview  was performed which showed normal LV function, mild coronary calcification, and no ischemia or infarct, and he was discharged home.  At May 2025 office visit, he was mildly hypotensive and orthostatic.  Myocardial status was reduced to 20 mg daily.  He also complained of cough and chest x-ray showed central bronchial thickening and he was advised to start oral Augmentin  and azithromycin  for ongoing symptoms.  Amlodipine therapy was subsequently discontinued  in the setting of orthostatic symptoms and lower extremity swelling.  As he continued to have presyncope, a ZIO monitor was ordered and showed predominantly sinus rhythm with an average rate of 69 bpm (49-105), rare PACs and PVCs without triggered events.   Since his office visit in July 2025, Objective   Home Medications    Current Outpatient Medications  Medication Sig Dispense Refill   amiodarone  (PACERONE ) 200 MG tablet Take 1 tablet (200 mg total) by mouth daily. 90 tablet 3   atorvastatin  (LIPITOR) 40 MG tablet Take 1 tablet (40 mg total) by mouth daily. 30 tablet 2   cholecalciferol  (VITAMIN D3) 25 MCG (1000 UNIT) tablet Take 1,000 Units by mouth 2 (two) times daily.     Continuous Blood Gluc Receiver (FREESTYLE LIBRE 2 READER) DEVI Inject 1 Device into the skin daily. DGX 1 each 3   Continuous Blood Gluc Sensor (FREESTYLE LIBRE 2 SENSOR) MISC 2 Devices by Does not apply  route daily. 2 each 0   cyclobenzaprine  (FLEXERIL ) 10 MG tablet Take 10 mg by mouth at bedtime.     docusate sodium  (COLACE) 100 MG capsule Take 100 mg by mouth daily.     dutasteride  (AVODART ) 0.5 MG capsule Take 0.5 mg by mouth daily.     ELIQUIS  5 MG TABS tablet TAKE 1 TABLET TWICE A DAY 180 tablet 1   FARXIGA  10 MG TABS tablet TAKE 1 TABLET BY MOUTH EVERY DAY 90 tablet 3   fexofenadine (ALLEGRA) 180 MG tablet Take 180 mg by mouth daily.     Finerenone 10 MG TABS Take 10 mg by mouth daily at 6 (six) AM.     gabapentin  (NEURONTIN ) 100 MG capsule Take 100 mg by mouth 2 (two) times daily. 100 mg in the morning, 100 mg 3p     gabapentin  (NEURONTIN ) 300 MG capsule Take 1 capsule (300 mg total) by mouth at bedtime. 90 capsule 1   Insulin  Glargine (BASAGLAR  KWIKPEN) 100 UNIT/ML INJECT 40 UNITS            SUBCUTANEOUSLY TWO TIMES A DAY (Patient taking differently: Inject 44 Units into the skin 2 (two) times daily.) 45 mL 1   magnesium  oxide (MAG-OX) 400 (240 Mg) MG tablet Take 1 tablet by mouth 2 (two) times daily.     metoprolol  tartrate (LOPRESSOR ) 50 MG tablet Take 50 mg by mouth 2 (two) times daily.     naloxone  (NARCAN ) nasal spray 4 mg/0.1 mL Please use in the event of overdose from narcotic medication 1 each 1   NOVOLOG  FLEXPEN 100 UNIT/ML FlexPen INJECT 14 UNITS            SUBCUTANEOUSLY IN THE      MORNING AND AT BEDTIME 15 mL 6   Omega-3 Fatty Acids (FISH OIL) 1200 MG CAPS Take 2,400 mg by mouth 2 (two) times daily.     ONETOUCH ULTRA test strip USE WITH METER TWICE DAILY 100 strip 6   oxyCODONE  (OXYCONTIN ) 40 mg 12 hr tablet Take 1 tablet (40 mg total) by mouth every 8 (eight) hours as needed. (Patient taking differently: Take 40 mg by mouth 3 (three) times daily.) 90 tablet 0   polyethylene glycol (MIRALAX  / GLYCOLAX ) 17 g packet Take 17 g by mouth daily.     pregabalin  (LYRICA ) 200 MG capsule TAKE 1 CAPSULE 3 TIMES A   DAY 270 capsule 1   rOPINIRole  (REQUIP ) 1 MG tablet TAKE 1 TO 2  TABLETS BY MOUTH AT BEDTIME AS NEEDED (  Patient taking differently: Take 1 mg by mouth 2 (two) times daily. 1 mg in the morning, 1 mg at 3p) 180 tablet 1   rOPINIRole  (REQUIP ) 2 MG tablet TAKE 1 TABLET BY MOUTH TWICE A DAY AS NEEDED (Patient taking differently: Take 2 mg by mouth at bedtime.) 180 tablet 0   sitaGLIPtin  (JANUVIA ) 100 MG tablet Take by mouth.     tamsulosin  (FLOMAX ) 0.4 MG CAPS capsule Take 0.4 mg by mouth daily.     telmisartan  (MICARDIS ) 20 MG tablet Take 1 tablet (20 mg total) by mouth daily. 90 tablet 3   torsemide  (DEMADEX ) 20 MG tablet Take 1 tablet (20 mg total) by mouth every other day. Take one tablet as needed for a weight gain of 2 lbs in 24 hrs or 5 lbs in a week.     No current facility-administered medications for this visit.     Physical Exam    VS:  There were no vitals taken for this visit. , BMI There is no height or weight on file to calculate BMI.          GEN: Well nourished, well developed, in no acute distress. HEENT: normal. Neck: Supple, no JVD, carotid bruits, or masses. Cardiac: RRR, no murmurs, rubs, or gallops. No clubbing, cyanosis, edema.  Radials 2+/PT 2+ and equal bilaterally.  Respiratory:  Respirations regular and unlabored, clear to auscultation bilaterally. GI: Soft, nontender, nondistended, BS + x 4. MS: no deformity or atrophy. Skin: warm and dry, no rash. Neuro:  Strength and sensation are intact. Psych: Normal affect.  Accessory Clinical Findings    ECG personally reviewed by me today -    *** - no acute changes.  Lab Results  Component Value Date   WBC 5.1 04/14/2024   HGB 12.0 (L) 04/14/2024   HCT 38.4 (L) 04/14/2024   MCV 86.5 04/14/2024   PLT 87 (L) 04/14/2024   Lab Results  Component Value Date   ALT 19 05/27/2023   AST 21 05/27/2023   ALKPHOS 75 05/27/2023   BILITOT 0.6 05/27/2023   Lab Results  Component Value Date   CHOL 163 04/13/2024   HDL 27 (L) 04/13/2024   LDLCALC 97 04/13/2024   TRIG 196 (H)  04/13/2024   CHOLHDL 6.0 04/13/2024    Lab Results  Component Value Date   TSH 6.660 (H) 06/08/2022    Labs dated July 18, 2024 Care Everywhere:  Sodium 135, potassium 5.3, chloride 95, CO2 33.4, BUN 28, creatinine 1.8, glucose 378 Calcium  8.2, albumin 3.6, total protein 6.2 Total bilirubin 0.4, alkaline phosphatase 99, AST 15, ALT 15 Hemoglobin A1c 9.7    Assessment & Plan    1.  ***  Lonni Meager, NP 08/17/2024, 2:20 PM

## 2024-08-22 ENCOUNTER — Encounter: Payer: Self-pay | Admitting: Podiatry

## 2024-08-22 ENCOUNTER — Ambulatory Visit (INDEPENDENT_AMBULATORY_CARE_PROVIDER_SITE_OTHER): Admitting: Podiatry

## 2024-08-22 DIAGNOSIS — M79674 Pain in right toe(s): Secondary | ICD-10-CM

## 2024-08-22 DIAGNOSIS — E1142 Type 2 diabetes mellitus with diabetic polyneuropathy: Secondary | ICD-10-CM

## 2024-08-22 DIAGNOSIS — B351 Tinea unguium: Secondary | ICD-10-CM | POA: Diagnosis not present

## 2024-08-22 DIAGNOSIS — M79675 Pain in left toe(s): Secondary | ICD-10-CM | POA: Diagnosis not present

## 2024-08-22 NOTE — Progress Notes (Signed)
This patient returns to my office for at risk foot care.  This patient requires this care by a professional since this patient will be at risk due to having  diabetes.  This patient is unable to cut nails himself since the patient cannot reach his nails.These nails are painful walking and wearing shoes.  This patient presents for at risk foot care today.  General Appearance  Alert, conversant and in no acute stress.  Vascular  Dorsalis pedis and posterior tibial  pulses are palpable  bilaterally.  Capillary return is within normal limits  bilaterally. Temperature is within normal limits  bilaterally.  Neurologic  Senn-Weinstein monofilament wire test within normal limits  bilaterally. Muscle power within normal limits bilaterally.  Nails Thick disfigured discolored nails with subungual debris  from hallux to fifth toes bilaterally. No evidence of bacterial infection or drainage bilaterally.  Orthopedic  No limitations of motion  feet .  No crepitus or effusions noted.  No bony pathology or digital deformities noted.  Skin  normotropic skin with no porokeratosis noted bilaterally.  No signs of infections or ulcers noted.     Onychomycosis  Pain in right toes  Pain in left toes  Consent was obtained for treatment procedures.   Mechanical debridement of nails 1-5  bilaterally performed with a nail nipper.  Filed with dremel without incident.    Return office visit   3 months                   Told patient to return for periodic foot care and evaluation due to potential at risk complications.   Nicholi Ghuman DPM   

## 2024-11-16 ENCOUNTER — Other Ambulatory Visit: Payer: Self-pay | Admitting: Cardiovascular Disease

## 2024-11-16 DIAGNOSIS — I480 Paroxysmal atrial fibrillation: Secondary | ICD-10-CM

## 2024-11-16 NOTE — Telephone Encounter (Signed)
 Eliquis  5mg  refill request received. Patient is 73 years old, weight-120.7kg, Crea-1.74 on 05/03/24, Diagnosis-Aflutter, and last seen by Medford Meager on 07/04/24. Dose is appropriate based on dosing criteria. Will send in refill to requested pharmacy.

## 2024-11-21 ENCOUNTER — Ambulatory Visit (INDEPENDENT_AMBULATORY_CARE_PROVIDER_SITE_OTHER): Admitting: Podiatry

## 2024-11-21 ENCOUNTER — Encounter: Payer: Self-pay | Admitting: Podiatry

## 2024-11-21 DIAGNOSIS — M79675 Pain in left toe(s): Secondary | ICD-10-CM | POA: Diagnosis not present

## 2024-11-21 DIAGNOSIS — B351 Tinea unguium: Secondary | ICD-10-CM | POA: Diagnosis not present

## 2024-11-21 DIAGNOSIS — M79674 Pain in right toe(s): Secondary | ICD-10-CM | POA: Diagnosis not present

## 2024-11-21 DIAGNOSIS — E1142 Type 2 diabetes mellitus with diabetic polyneuropathy: Secondary | ICD-10-CM | POA: Diagnosis not present

## 2024-11-21 NOTE — Progress Notes (Signed)
This patient returns to my office for at risk foot care.  This patient requires this care by a professional since this patient will be at risk due to having  diabetes.  This patient is unable to cut nails himself since the patient cannot reach his nails.These nails are painful walking and wearing shoes.  This patient presents for at risk foot care today.  General Appearance  Alert, conversant and in no acute stress.  Vascular  Dorsalis pedis and posterior tibial  pulses are palpable  bilaterally.  Capillary return is within normal limits  bilaterally. Temperature is within normal limits  bilaterally.  Neurologic  Senn-Weinstein monofilament wire test within normal limits  bilaterally. Muscle power within normal limits bilaterally.  Nails Thick disfigured discolored nails with subungual debris  from hallux to fifth toes bilaterally. No evidence of bacterial infection or drainage bilaterally.  Orthopedic  No limitations of motion  feet .  No crepitus or effusions noted.  No bony pathology or digital deformities noted.  Skin  normotropic skin with no porokeratosis noted bilaterally.  No signs of infections or ulcers noted.     Onychomycosis  Pain in right toes  Pain in left toes  Consent was obtained for treatment procedures.   Mechanical debridement of nails 1-5  bilaterally performed with a nail nipper.  Filed with dremel without incident.    Return office visit   3 months                   Told patient to return for periodic foot care and evaluation due to potential at risk complications.   Nicholi Ghuman DPM   

## 2024-12-21 ENCOUNTER — Other Ambulatory Visit: Payer: Self-pay | Admitting: Podiatry

## 2024-12-24 ENCOUNTER — Telehealth: Payer: Self-pay | Admitting: Cardiovascular Disease

## 2024-12-24 NOTE — Telephone Encounter (Signed)
 Patient c/o Palpitations:  STAT if patient reporting lightheadedness, shortness of breath, or chest pain  How long have you had palpitations/irregular HR/ Afib? Are you having the symptoms now? Since last night   Are you currently experiencing lightheadedness, SOB or CP? SOB   Do you have a history of afib (atrial fibrillation) or irregular heart rhythm? Yes   Have you checked your BP or HR? (document readings if available): No   Are you experiencing any other symptoms? Fatigue

## 2024-12-24 NOTE — Telephone Encounter (Signed)
 STAT phone call. Patient having shortness of breath for the past couple weeks. He decided last night after it got bad enough to check his heart monitor, which stated he was in a fib. He took his BP: 102/56 and heart rate was 76. Him and wife thought symptoms may be related to increasing activity for christmas time but it is still persistent. DOD slot scheduled for tomorrow with patient provider Dr Perla to do EKG and monitor symptoms. Patient taking medications as prescribed and has not missed a single dose of eliquis  or amiodarone .

## 2024-12-25 ENCOUNTER — Encounter: Admitting: Physician Assistant

## 2024-12-25 ENCOUNTER — Ambulatory Visit: Admitting: Cardiovascular Disease

## 2024-12-25 ENCOUNTER — Other Ambulatory Visit: Payer: Self-pay | Admitting: Cardiovascular Disease

## 2024-12-25 VITALS — BP 130/62 | HR 100 | Ht 71.0 in | Wt 264.0 lb

## 2024-12-25 DIAGNOSIS — E782 Mixed hyperlipidemia: Secondary | ICD-10-CM | POA: Diagnosis not present

## 2024-12-25 DIAGNOSIS — I25118 Atherosclerotic heart disease of native coronary artery with other forms of angina pectoris: Secondary | ICD-10-CM | POA: Insufficient documentation

## 2024-12-25 DIAGNOSIS — I480 Paroxysmal atrial fibrillation: Secondary | ICD-10-CM | POA: Insufficient documentation

## 2024-12-25 DIAGNOSIS — I700 Atherosclerosis of aorta: Secondary | ICD-10-CM | POA: Insufficient documentation

## 2024-12-25 DIAGNOSIS — I5032 Chronic diastolic (congestive) heart failure: Secondary | ICD-10-CM | POA: Diagnosis not present

## 2024-12-25 DIAGNOSIS — I10 Essential (primary) hypertension: Secondary | ICD-10-CM | POA: Insufficient documentation

## 2024-12-25 DIAGNOSIS — I200 Unstable angina: Secondary | ICD-10-CM | POA: Diagnosis present

## 2024-12-25 DIAGNOSIS — I25110 Atherosclerotic heart disease of native coronary artery with unstable angina pectoris: Secondary | ICD-10-CM

## 2024-12-25 DIAGNOSIS — I483 Typical atrial flutter: Secondary | ICD-10-CM | POA: Diagnosis not present

## 2024-12-25 MED ORDER — AMIODARONE HCL 200 MG PO TABS: ORAL_TABLET | ORAL | 3 refills | Status: DC

## 2024-12-25 NOTE — Progress Notes (Signed)
 Cardiology Office Note  Date:  12/25/2024   ID:  Jerry Parsons, DOB 12-23-1951, MRN [id number]  PCP:  Bertrum Charlie CROME, MD   Chief Complaint  Patient presents with   Shortness of Breath    Patient c/o shortness of breath, A-fib/flutter, chest pain and fatigue/loss of energy.     HPI:  Jerry Parsons is a  73--year-old gentleman, multiple back surgeries,  spinal stenosis , s/p arthroscopic knee surgery, total knee replacement left obesity,  diabetes ,  DVT, PE in March 2011 previously on warfarin,  30 year smoking history,   neuropathy hypertension,  depression,  renal disease,  obstructive sleep apnea on CPAP,  chronic sweating at nighttime which he attributes to the Vicodin. Prior severe pneumonia, ATN Remote atrial flutter dating back to 2014, fibrillation, last episode in 2015.  Flutter with cardioversion 08/2020 Last cardioversion January 25, 2024 for flutter presenting for routine followup of his hypertension , atrial flutter  Last seen by myself in February 2024 Multiple visits to our clinic, last seen July 2025  Several weeks of palpitations, sob Has had difficulty sleeping Feels he is out of rhythm, atrial flutter Compliant with his Eliquis  5 twice daily and amiodarone  200 daily with metoprolol  to tartrate 50 twice daily Having difficulty sleeping secondary to palpitations  Interested in repeat cardioversion  Lab work reviewed A1C 10.5  EKG personally reviewed by myself on todays visit EKG Interpretation Date/Time:  Tuesday December 25 2024 15:21:53 EST Ventricular Rate:  100 PR Interval:    QRS Duration:  120 QT Interval:  376 QTC Calculation: 485 R Axis:   60  Text Interpretation: Atrial flutter with variable A-V block RSR' or QR pattern in V1 suggests right ventricular conduction delay When compared with ECG of 04-Jul-2024 14:37, Atrial flutter has replaced Sinus rhythm Vent. rate has increased BY  33 BPM Confirmed by Perla Lye [phone]) on  12/25/2024 3:28:53 PM    Recently missed Lasix  for 2 days secondary to medical appointments, appreciated some abdominal fullness Back on his Lasix  40 daily SOB once in a while, stable Denies chest pain on exertion concerning for angina  Unable to sit with legs up secondary to chronic back pain Feels his leg swelling is stable  Labs from 6 months ago A1c 7.3, creatinine 1.28 potassium 4.1  Other past medical history reviewed Echocardiogram June 2021  mild LVH otherwise normal LV size and function   total cholesterol 134 LDL 67   hospitalization June 05, 2020  atrial flutter Underwent cardioversion, 09/11/2020 NSR restored  Chronic back pain 9 back surgeries, previously thought about  spinal cord stimulator  Admission 5/18 for sepsis, salmonella diarrhea, per the discharge summary From eggs, per the patient He did not receive antibiotics  pneumonia while at the [facility] January 2017 He was in ICU for 3 days, long recovery Lost more than 30 pounds,  Lipid panel done while he was very ill early 2017, total cholesterol at that time 89  Denies any atrial fibrillation through his hospital course, reports he was changed to amiodarone  IV infusion and then back to pill when he was tolerating oral medications  Previous event where he had buckling of his knee, fall, contusion, TIA-type symptoms that took him to the hospital. He was kept overnight and most of his workup was relatively unrevealing including MRI/MRA, carotid ultrasound, echocardiogram.   History of atrial flutter, status post cardioversion on 03/14/2013.   2 admissions to the hospital for gastroenteritis/ campylobacter infection. Required a long course of  antibiotics. Possible  infection from tainted chicken.   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  PSH:    Past Surgical History:  Procedure Laterality Date   BACK SURGERY     x 8; upper x 3 & lower x 5   CARDIOVERSION  03/14/13, 10/16   2014 - [facility], 2016 - [facility]   CARDIOVERSION N/A 09/11/2020   Procedure: CARDIOVERSION;  Surgeon: Perla Evalene PARAS, MD;  Location: [facility] OR[facility];  Service: Cardiovascular;  Laterality: N/A;   CARDIOVERSION N/A 01/25/2024   Procedure: CARDIOVERSION;  Surgeon: Perla Evalene PARAS, MD;  Location: [facility] OR[facility];  Service: Cardiovascular;  Laterality: N/A;   CATARACT EXTRACTION W/PHACO Left 10/29/2015   Procedure: CATARACT EXTRACTION PHACO AND INTRAOCULAR LENS PLACEMENT (IOC);  Surgeon: Dene Etienne, MD;  Location: [facility];  Service: Ophthalmology;  Laterality: Left;  DIABETIC - insulin  and oral medsSleep apnea - no machine   CATARACT EXTRACTION W/PHACO Right 02/16/2023   Procedure: CATARACT EXTRACTION PHACO AND INTRAOCULAR LENS PLACEMENT (IOC) RIGHT DIABETIC MALYUGIN HEALON 5 VISION BLUE  65.52  03:57.7;  Surgeon: Etienne Dene, MD;  Location: [facility];  Service: Ophthalmology;  Laterality: Right;  Diabetic   CHOLECYSTECTOMY     COLONOSCOPY WITH PROPOFOL  N/A 01/16/2018   Procedure: COLONOSCOPY WITH PROPOFOL ;  Surgeon: Viktoria Lamar DASEN, MD;  Location: [facility];  Service: Endoscopy;  Laterality: N/A;   ESOPHAGOGASTRODUODENOSCOPY (EGD) WITH PROPOFOL  N/A 01/16/2018   Procedure: ESOPHAGOGASTRODUODENOSCOPY (EGD) WITH PROPOFOL ;  Surgeon: Viktoria Lamar DASEN, MD;  Location: [facility] END[facility]COPY;  Service: Endoscopy;  Laterality:  N/A;   EYE SURGERY     GALLBLADDER SURGERY  2002   JOINT REPLACEMENT Right 2018   KNEE ARTHROSCOPY     left    PARATHYROIDECTOMY N/A 07/25/2020   Procedure: PARATHYROIDECTOMY;  Surgeon: Marolyn Nest, MD;  Location: [facility] OR[facility];  Service: General;  Laterality: N/A;   ROTATOR CUFF REPAIR  2001   left    TEE WITHOUT CARDIOVERSION N/A 04/26/2018   Procedure: TRANSESOPHAGEAL ECHOCARDIOGRAM (TEE);  Surgeon: Mady Bruckner, MD;  Location: [facility];  Service: Cardiovascular;  Laterality: N/A;   TONSILLECTOMY     TOTAL KNEE ARTHROPLASTY Right 06/20/2017   Procedure: RIGHT TOTAL KNEE ARTHROPLASTY;  Surgeon: Melodi Lerner, MD;  Location: [facility];  Service: Orthopedics;  Laterality: Right;    Current Outpatient Medications  Medication Sig Dispense Refill   amiodarone  (PACERONE ) 200 MG tablet Take 1 tablet (200 mg total) by mouth daily. 90 tablet 3   ASPIRIN  ADULT LOW DOSE 81 MG tablet Take 81 mg by mouth daily.     atorvastatin  (LIPITOR) 40 MG tablet Take 1 tablet (40 mg total) by mouth daily. 30 tablet 2   docusate sodium  (COLACE) 100 MG capsule Take 100 mg by mouth daily.     dutasteride  (AVODART ) 0.5 MG capsule Take 0.5 mg by mouth daily.     ELIQUIS  5 MG TABS tablet TAKE 1 TABLET TWICE A DAY 180 tablet 1   FARXIGA  10 MG TABS tablet TAKE 1 TABLET BY MOUTH EVERY DAY 90 tablet 3   fexofenadine (  ALLEGRA) 180 MG tablet Take 180 mg by mouth daily.     gabapentin  (NEURONTIN ) 100 MG capsule Take 100 mg by mouth 2 (two) times daily. 100 mg in the morning, 100 mg 3p     gabapentin  (NEURONTIN ) 300 MG capsule Take 1 capsule (300 mg total) by mouth at bedtime. 90 capsule 1   Insulin  Glargine (BASAGLAR  KWIKPEN) 100 UNIT/ML INJECT 40 UNITS            SUBCUTANEOUSLY TWO TIMES A DAY (Patient taking differently: Inject 48 Units into the skin 2 (two) times daily.) 45 mL 1   magnesium  oxide (MAG-OX) 400 (240 Mg) MG tablet Take 1 tablet by mouth 2 (two) times daily.     metoprolol  tartrate (LOPRESSOR ) 50 MG tablet  Take 50 mg by mouth 2 (two) times daily.     naloxone  (NARCAN ) nasal spray 4 mg/0.1 mL Please use in the event of overdose from narcotic medication 1 each 1   nortriptyline  (PAMELOR ) 10 MG capsule Take 30 mg by mouth at bedtime.     NOVOLOG  FLEXPEN 100 UNIT/ML FlexPen INJECT 14 UNITS            SUBCUTANEOUSLY IN THE      MORNING AND AT BEDTIME 15 mL 6   Omega-3 Fatty Acids (FISH OIL) 1200 MG CAPS Take 2,400 mg by mouth 2 (two) times daily.     ONETOUCH ULTRA test strip USE WITH METER TWICE DAILY 100 strip 6   oxyCODONE  (OXYCONTIN ) 40 mg 12 hr tablet Take 1 tablet (40 mg total) by mouth every 8 (eight) hours as needed. 90 tablet 0   polyethylene glycol (MIRALAX  / GLYCOLAX ) 17 g packet Take 17 g by mouth daily.     pregabalin  (LYRICA ) 200 MG capsule TAKE 1 CAPSULE 3 TIMES A   DAY 270 capsule 1   rOPINIRole  (REQUIP ) 1 MG tablet TAKE 1 TO 2 TABLETS BY MOUTH AT BEDTIME AS NEEDED (Patient taking differently: Take 1 mg by mouth 2 (two) times daily. 1 mg in the morning, 1 mg at 3p) 180 tablet 1   rOPINIRole  (REQUIP ) 2 MG tablet TAKE 1 TABLET BY MOUTH TWICE A DAY AS NEEDED (Patient taking differently: Take 2 mg by mouth at bedtime.) 180 tablet 0   sitaGLIPtin  (JANUVIA ) 100 MG tablet Take by mouth.     tamsulosin  (FLOMAX ) 0.4 MG CAPS capsule Take 0.4 mg by mouth daily.     torsemide  (DEMADEX ) 20 MG tablet Take 1 tablet (20 mg total) by mouth every other day. Take one tablet as needed for a weight gain of 2 lbs in 24 hrs or 5 lbs in a week.     cholecalciferol  (VITAMIN D3) 25 MCG (1000 UNIT) tablet Take 1,000 Units by mouth 2 (two) times daily. (Patient not taking: Reported on 12/25/2024)     Continuous Blood Gluc Receiver (FREESTYLE LIBRE 2 READER) DEVI Inject 1 Device into the skin daily. DGX (Patient not taking: Reported on 12/25/2024) 1 each 3   Continuous Blood Gluc Sensor (FREESTYLE LIBRE 2 SENSOR) MISC 2 Devices by Does not apply route daily. (Patient not taking: Reported on 12/25/2024) 2 each 0    cyclobenzaprine  (FLEXERIL ) 10 MG tablet Take 10 mg by mouth at bedtime. (Patient not taking: Reported on 12/25/2024)     Finerenone 10 MG TABS Take 10 mg by mouth daily at 6 (six) AM. (Patient not taking: Reported on 12/25/2024)     meloxicam  (MOBIC ) 7.5 MG tablet TAKE 1 TABLET BY MOUTH EVERY DAY (Patient  not taking: Reported on 12/25/2024) 30 tablet 0   No current facility-administered medications for this visit.    Allergies:   Carisoprodol   Social History:  The patient  reports that he quit smoking about 35 years ago. His smoking use included cigarettes. He started smoking about 55 years ago. He has a 40 pack-year smoking history. He has never used smokeless tobacco. He reports that he does not drink alcohol and does not use drugs.   Family History:   family history includes Heart attack in his brother.    Review of Systems: Review of Systems  Constitutional: Negative.   HENT: Negative.    Respiratory:  Positive for shortness of breath.   Cardiovascular:  Positive for palpitations.  Gastrointestinal: Negative.   Musculoskeletal: Negative.   Neurological: Negative.   Psychiatric/Behavioral: Negative.    All other systems reviewed and are negative.   PHYSICAL EXAM: VS:  BP 130/62 (BP Location: Left Arm, Patient Position: Sitting, Cuff Size: Large)   Pulse 100   Ht 5' 11 (1.803 m)   Wt 264 lb (119.7 kg)   SpO2 91%   BMI 36.82 kg/m  , BMI Body mass index is 36.82 kg/m. Constitutional:  oriented to person, place, and time. No distress.  HENT:  Head: Grossly normal Eyes:  no discharge. No scleral icterus.  Neck: No JVD, no carotid bruits  Cardiovascular: Irregularly irregular no murmurs appreciated Pulmonary/Chest: Clear to auscultation bilaterally, no wheezes or rails Abdominal: Soft.  no distension.  no tenderness.  Musculoskeletal: Normal range of motion Neurological:  normal muscle tone. Coordination normal. No atrophy Skin: Skin warm and dry Psychiatric: normal  affect, pleasant  Recent Labs: 04/12/2024: B Natriuretic Peptide 32.1 04/14/2024: Hemoglobin 12.0; Platelets 87 05/03/2024: BUN 36; Creatinine, Ser 1.74; Potassium 5.0; Sodium 141    Lipid Panel Lab Results  Component Value Date   CHOL 163 04/13/2024   HDL 27 (L) 04/13/2024   LDLCALC 97 04/13/2024   TRIG 196 (H) 04/13/2024      Wt Readings from Last 3 Encounters:  12/25/24 264 lb (119.7 kg)  07/04/24 266 lb 3.2 oz (120.7 kg)  04/27/24 279 lb (126.6 kg)     ASSESSMENT AND PLAN:  Atrial flutter, unspecified type (HCC) -  Recurrence of his arrhythmia in the past several weeks, symptomatic -Compliant with his metoprolol , amiodarone , Eliquis  -Would prefer cardioversion over ablation at this time - Recommend he reload amiodarone  400 twice daily 1 week then cardioversion - For recurrent episodes may need to consider ablation - He prefers to wait on referral to EP at this time  HYPERTENSION, BENIGN -  Blood pressure is well controlled on today's visit. No changes made to the medications.  Paroxysmal atrial fibrillation (HCC) -  Back in atrial fibrillation/flutter as detailed above Plan for amiodarone  load, repeat cardioversion  Hyperlipidemia A1c trending higher, continue Lipitor 40 daily  Chronic diastolic CHF (congestive heart failure) (HCC) appears euvolemic Tolerating torsemide  20 every other day  Uncontrolled type 2 diabetes mellitus with complication, with long-term current Calorie restriction recommended A1c running 10.0 Working with primary care  Primary osteoarthritis of both knees Limited mobility secondary to back and leg discomfort, knee pain  Leg swelling Significant venous component, stable symptoms  Neuropathy, chronic back pain chronic pain History of fusions    Orders Placed This Encounter  Procedures   EKG 12-Lead     Signed, Velinda Lunger, M.D., Ph.D. 12/25/2024  [facility] [place], [place] [phone]

## 2024-12-25 NOTE — Patient Instructions (Signed)
 Cardioversion for atrial flutter   Medication Instructions:  Please increase the amiodarone  400 mg twice a day for 1 weeks  Then 200 mg twice a day for 1 week and on 8 Jan (when starting the new dosage) you'll see Dr Gollan  If you need a refill on your cardiac medications before your next appointment, please call your pharmacy.   Lab work: No new labs needed  Testing/Procedures:    Dear Jerry Parsons  You are scheduled for a Cardioversion on Thursday, January 8 with Dr. Gollan.  Please arrive at the [facility] Entrance of [facility], [address], [place] [address] at 6:30 AM (This is 1 hour(s) prior to your procedure time).  Proceed to the Check-In Desk directly inside the entrance.  Procedure Parking: Use the entrance off of the [address] side of the hospital. Turn right upon entering and follow the driveway to parking that is directly in front of the [facility]. There is no valet parking available at this entrance, however there is an awning directly in front of the [facility] for drop off/ pick up for patients.   DIET:  Nothing to eat or drink after midnight except a sip of water with medications (see medication instructions below)  MEDICATION INSTRUCTIONS: !!IF ANY NEW MEDICATIONS ARE STARTED AFTER TODAY, PLEASE NOTIFY YOUR PROVIDER AS SOON AS POSSIBLE!!  FYI: Medications such as Semaglutide (Ozempic, Wegovy), Tirzepatide (Mounjaro, Zepbound), Dulaglutide (Trulicity), etc (GLP1 agonists) AND Canagliflozin (Invokana), Dapagliflozin  (Farxiga ), Empagliflozin (Jardiance), Ertugliflozin (Steglatro), Bexagliflozin [employer]) or any combination with one of these drugs such as Invokamet (Canagliflozin/Metformin ), Synjardy (Empagliflozin/Metformin ), etc (SGLT2 inhibitors) must be held around the time of a procedure. This is not a comprehensive list of all of these drugs. Please review all of your medications and talk to your provider if you  take any one of these. If you are not sure, ask your provider.   HOLD: Dapagliflozin  (Farxiga ) for 3 days prior to the procedure. Last dose on Sunday, January 04.   Continue taking your anticoagulant (blood thinner): Apixaban  (Eliquis ) and Asprin 81 mg.  You will need to continue this after your procedure until you are told by your provider that it is safe to stop.    LABS: These were collected on 12/04/24  FYI:  For your safety, and to allow us  to monitor your vital signs accurately during the surgery/procedure we request: If you have artificial nails, gel coating, SNS etc, please have those removed prior to your surgery/procedure. Not having the nail coverings /polish removed may result in cancellation or delay of your surgery/procedure.  You must have a responsible person to drive you home and stay in the waiting area during your procedure. Failure to do so could result in cancellation.  Bring your insurance cards.  *Special Note: Every effort is made to have your procedure done on time. Occasionally there are emergencies that occur at the hospital that may cause delays. Please be patient if a delay does occur.      Follow-Up: At [facility], you and your health needs are our priority.  As part of our continuing mission to provide you with exceptional heart care, we have created designated Provider Care Teams.  These Care Teams include your primary Cardiologist (physician) and Advanced Practice Providers (APPs -  Physician Assistants and Nurse Practitioners) who all work together to provide you with the care you need, when you need it.  You will need a follow up  appointment in 1 month, APP ok  Providers on your designated Care Team:   Lonni Meager, NP Bernardino Bring, PA-C Cadence [place], [place]  COVID-19 Vaccine Information can be found at: [email or web address] For questions related to vaccine distribution or appointments,  please email [email or web address] or call [phone].

## 2024-12-25 NOTE — H&P (View-Only) (Signed)
 Cardiology Office Note  Date:  12/25/2024   ID:  Jerry Parsons, DOB 12-23-1951, MRN [id number]  PCP:  Jerry Charlie CROME, Parsons   Chief Complaint  Patient presents with   Shortness of Breath    Patient c/o shortness of breath, A-fib/flutter, chest pain and fatigue/loss of energy.     HPI:  [name] is a  73--year-old gentleman, multiple back surgeries,  spinal stenosis , s/p arthroscopic knee surgery, total knee replacement left obesity,  diabetes ,  DVT, PE in March 2011 previously on warfarin,  30 year smoking history,   neuropathy hypertension,  depression,  renal disease,  obstructive sleep apnea on CPAP,  chronic sweating at nighttime which he attributes to the Vicodin. Prior severe pneumonia, ATN Remote atrial flutter dating back to 2014, fibrillation, last episode in 2015.  Flutter with cardioversion 08/2020 Last cardioversion January 25, 2024 for flutter presenting for routine followup of his hypertension , atrial flutter  Last seen by myself in February 2024 Multiple visits to our clinic, last seen July 2025  Several weeks of palpitations, sob Has had difficulty sleeping Feels he is out of rhythm, atrial flutter Compliant with his Eliquis  5 twice daily and amiodarone  200 daily with metoprolol  to tartrate 50 twice daily Having difficulty sleeping secondary to palpitations  Interested in repeat cardioversion  Lab work reviewed A1C 10.5  EKG personally reviewed by myself on todays visit EKG Interpretation Date/Time:  Tuesday December 25 2024 15:21:53 EST Ventricular Rate:  100 PR Interval:    QRS Duration:  120 QT Interval:  376 QTC Calculation: 485 R Axis:   60  Text Interpretation: Atrial flutter with variable A-V block RSR' or QR pattern in V1 suggests right ventricular conduction delay When compared with ECG of 04-Jul-2024 14:37, Atrial flutter has replaced Sinus rhythm Vent. rate has increased BY  33 BPM Confirmed by Jerry Parsons) on  12/25/2024 3:28:53 PM    Recently missed Lasix  for 2 days secondary to medical appointments, appreciated some abdominal fullness Back on his Lasix  40 daily SOB once in a while, stable Denies chest pain on exertion concerning for angina  Unable to sit with legs up secondary to chronic back pain Feels his leg swelling is stable  Labs from 6 months ago A1c 7.3, creatinine 1.28 potassium 4.1  Other past medical history reviewed Echocardiogram June 2021  mild LVH otherwise normal LV size and function   total cholesterol 134 LDL 67   hospitalization June 05, 2020  atrial flutter Underwent cardioversion, 09/11/2020 NSR restored  Chronic back pain 9 back surgeries, previously thought about  spinal cord stimulator  Admission 5/18 for sepsis, salmonella diarrhea, per the discharge summary From eggs, per the patient He did not receive antibiotics  pneumonia while at the [facility] January 2017 He was in ICU for 3 days, long recovery Lost more than 30 pounds,  Lipid panel done while he was very ill early 2017, total cholesterol at that time 89  Denies any atrial fibrillation through his hospital course, reports he was changed to amiodarone  IV infusion and then back to pill when he was tolerating oral medications  Previous event where he had buckling of his knee, fall, contusion, TIA-type symptoms that took him to the hospital. He was kept overnight and most of his workup was relatively unrevealing including MRI/MRA, carotid ultrasound, echocardiogram.   History of atrial flutter, status post cardioversion on 03/14/2013.   2 admissions to the hospital for gastroenteritis/ campylobacter infection. Required a long course of  antibiotics. Possible  infection from tainted chicken.   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  PSH:    Past Surgical History:  Procedure Laterality Date   BACK SURGERY     x 8; upper x 3 & lower x 5   CARDIOVERSION  03/14/13, 10/16   2014 - [facility], 2016 - [facility]   CARDIOVERSION N/A 09/11/2020   Procedure: CARDIOVERSION;  Surgeon: Jerry Parsons;  Location: [facility] ORS;  Service: Cardiovascular;  Laterality: N/A;   CARDIOVERSION N/A 01/25/2024   Procedure: CARDIOVERSION;  Surgeon: Jerry Parsons;  Location: [facility] ORS;  Service: Cardiovascular;  Laterality: N/A;   CATARACT EXTRACTION W/PHACO Left 10/29/2015   Procedure: CATARACT EXTRACTION PHACO AND INTRAOCULAR LENS PLACEMENT (IOC);  Surgeon: Jerry Etienne, Parsons;  Location: [facility];  Service: Ophthalmology;  Laterality: Left;  DIABETIC - insulin  and oral medsSleep apnea - no machine   CATARACT EXTRACTION W/PHACO Right 02/16/2023   Procedure: CATARACT EXTRACTION PHACO AND INTRAOCULAR LENS PLACEMENT (IOC) RIGHT DIABETIC MALYUGIN HEALON 5 VISION BLUE  65.52  03:57.7;  Surgeon: Parsons Dene, Parsons;  Location: [facility];  Service: Ophthalmology;  Laterality: Right;  Diabetic   CHOLECYSTECTOMY     COLONOSCOPY WITH PROPOFOL  N/A 01/16/2018   Procedure: COLONOSCOPY WITH PROPOFOL ;  Surgeon: Jerry Lamar DASEN, Parsons;  Location: [facility] END[facility];  Service: Endoscopy;  Laterality: N/A;   ESOPHAGOGASTRODUODENOSCOPY (EGD) WITH PROPOFOL  N/A 01/16/2018   Procedure: ESOPHAGOGASTRODUODENOSCOPY (EGD) WITH PROPOFOL ;  Surgeon: Jerry Lamar DASEN, Parsons;  Location: [facility] ENDOSCOPY;  Service: Endoscopy;  Laterality:  N/A;   EYE SURGERY     GALLBLADDER SURGERY  2002   JOINT REPLACEMENT Right 2018   KNEE ARTHROSCOPY     left    PARATHYROIDECTOMY N/A 07/25/2020   Procedure: PARATHYROIDECTOMY;  Surgeon: Marolyn Nest, Parsons;  Location: [facility] ORS;  Service: General;  Laterality: N/A;   ROTATOR CUFF REPAIR  2001   left    TEE WITHOUT CARDIOVERSION N/A 04/26/2018   Procedure: TRANSESOPHAGEAL ECHOCARDIOGRAM (TEE);  Surgeon: Mady Bruckner, Parsons;  Location: [facility] OR[facility];  Service: Cardiovascular;  Laterality: N/A;   TONSILLECTOMY     TOTAL KNEE ARTHROPLASTY Right 06/20/2017   Procedure: RIGHT TOTAL KNEE ARTHROPLASTY;  Surgeon: Melodi Lerner, Parsons;  Location: [facility];  Service: Orthopedics;  Laterality: Right;    Current Outpatient Medications  Medication Sig Dispense Refill   amiodarone  (PACERONE ) 200 MG tablet Take 1 tablet (200 mg total) by mouth daily. 90 tablet 3   ASPIRIN  ADULT LOW DOSE 81 MG tablet Take 81 mg by mouth daily.     atorvastatin  (LIPITOR) 40 MG tablet Take 1 tablet (40 mg total) by mouth daily. 30 tablet 2   docusate sodium  (COLACE) 100 MG capsule Take 100 mg by mouth daily.     dutasteride  (AVODART ) 0.5 MG capsule Take 0.5 mg by mouth daily.     ELIQUIS  5 MG TABS tablet TAKE 1 TABLET TWICE A DAY 180 tablet 1   FARXIGA  10 MG TABS tablet TAKE 1 TABLET BY MOUTH EVERY DAY 90 tablet 3   fexofenadine (  ALLEGRA) 180 MG tablet Take 180 mg by mouth daily.     gabapentin  (NEURONTIN ) 100 MG capsule Take 100 mg by mouth 2 (two) times daily. 100 mg in the morning, 100 mg 3p     gabapentin  (NEURONTIN ) 300 MG capsule Take 1 capsule (300 mg total) by mouth at bedtime. 90 capsule 1   Insulin  Glargine (BASAGLAR  KWIKPEN) 100 UNIT/ML INJECT 40 UNITS            SUBCUTANEOUSLY TWO TIMES A DAY (Patient taking differently: Inject 48 Units into the skin 2 (two) times daily.) 45 mL 1   magnesium  oxide (MAG-OX) 400 (240 Mg) MG tablet Take 1 tablet by mouth 2 (two) times daily.     metoprolol  tartrate (LOPRESSOR ) 50 MG tablet  Take 50 mg by mouth 2 (two) times daily.     naloxone  (NARCAN ) nasal spray 4 mg/0.1 mL Please use in the event of overdose from narcotic medication 1 each 1   nortriptyline  (PAMELOR ) 10 MG capsule Take 30 mg by mouth at bedtime.     NOVOLOG  FLEXPEN 100 UNIT/ML FlexPen INJECT 14 UNITS            SUBCUTANEOUSLY IN THE      MORNING AND AT BEDTIME 15 mL 6   Omega-3 Fatty Acids (FISH OIL) 1200 MG CAPS Take 2,400 mg by mouth 2 (two) times daily.     ONETOUCH ULTRA test strip USE WITH METER TWICE DAILY 100 strip 6   oxyCODONE  (OXYCONTIN ) 40 mg 12 hr tablet Take 1 tablet (40 mg total) by mouth every 8 (eight) hours as needed. 90 tablet 0   polyethylene glycol (MIRALAX  / GLYCOLAX ) 17 g packet Take 17 g by mouth daily.     pregabalin  (LYRICA ) 200 MG capsule TAKE 1 CAPSULE 3 TIMES A   DAY 270 capsule 1   rOPINIRole  (REQUIP ) 1 MG tablet TAKE 1 TO 2 TABLETS BY MOUTH AT BEDTIME AS NEEDED (Patient taking differently: Take 1 mg by mouth 2 (two) times daily. 1 mg in the morning, 1 mg at 3p) 180 tablet 1   rOPINIRole  (REQUIP ) 2 MG tablet TAKE 1 TABLET BY MOUTH TWICE A DAY AS NEEDED (Patient taking differently: Take 2 mg by mouth at bedtime.) 180 tablet 0   sitaGLIPtin  (JANUVIA ) 100 MG tablet Take by mouth.     tamsulosin  (FLOMAX ) 0.4 MG CAPS capsule Take 0.4 mg by mouth daily.     torsemide  (DEMADEX ) 20 MG tablet Take 1 tablet (20 mg total) by mouth every other day. Take one tablet as needed for a weight gain of 2 lbs in 24 hrs or 5 lbs in a week.     cholecalciferol  (VITAMIN D3) 25 MCG (1000 UNIT) tablet Take 1,000 Units by mouth 2 (two) times daily. (Patient not taking: Reported on 12/25/2024)     Continuous Blood Gluc Receiver (FREESTYLE LIBRE 2 READER) DEVI Inject 1 Device into the skin daily. DGX (Patient not taking: Reported on 12/25/2024) 1 each 3   Continuous Blood Gluc Sensor (FREESTYLE LIBRE 2 SENSOR) MISC 2 Devices by Does not apply route daily. (Patient not taking: Reported on 12/25/2024) 2 each 0    cyclobenzaprine  (FLEXERIL ) 10 MG tablet Take 10 mg by mouth at bedtime. (Patient not taking: Reported on 12/25/2024)     Finerenone 10 MG TABS Take 10 mg by mouth daily at 6 (six) AM. (Patient not taking: Reported on 12/25/2024)     meloxicam  (MOBIC ) 7.5 MG tablet TAKE 1 TABLET BY MOUTH EVERY DAY (Patient  not taking: Reported on 12/25/2024) 30 tablet 0   No current facility-administered medications for this visit.    Allergies:   Carisoprodol   Social History:  The patient  reports that he quit smoking about 35 years ago. His smoking use included cigarettes. He started smoking about 55 years ago. He has a 40 pack-year smoking history. He has never used smokeless tobacco. He reports that he does not drink alcohol and does not use drugs.   Family History:   family history includes Heart attack in his brother.    Review of Systems: Review of Systems  Constitutional: Negative.   HENT: Negative.    Respiratory:  Positive for shortness of breath.   Cardiovascular:  Positive for palpitations.  Gastrointestinal: Negative.   Musculoskeletal: Negative.   Neurological: Negative.   Psychiatric/Behavioral: Negative.    All other systems reviewed and are negative.   PHYSICAL EXAM: VS:  BP 130/62 (BP Location: Left Arm, Patient Position: Sitting, Cuff Size: Large)   Pulse 100   Ht 5' 11 (1.803 m)   Wt 264 lb (119.7 kg)   SpO2 91%   BMI 36.82 kg/m  , BMI Body mass index is 36.82 kg/m. Constitutional:  oriented to person, place, and time. No distress.  HENT:  Head: Grossly normal Eyes:  no discharge. No scleral icterus.  Neck: No JVD, no carotid bruits  Cardiovascular: Irregularly irregular no murmurs appreciated Pulmonary/Chest: Clear to auscultation bilaterally, no wheezes or rails Abdominal: Soft.  no distension.  no tenderness.  Musculoskeletal: Normal range of motion Neurological:  normal muscle tone. Coordination normal. No atrophy Skin: Skin warm and dry Psychiatric: normal  affect, pleasant  Recent Labs: 04/12/2024: B Natriuretic Peptide 32.1 04/14/2024: Hemoglobin 12.0; Platelets 87 05/03/2024: BUN 36; Creatinine, Ser 1.74; Potassium 5.0; Sodium 141    Lipid Panel Lab Results  Component Value Date   CHOL 163 04/13/2024   HDL 27 (L) 04/13/2024   LDLCALC 97 04/13/2024   TRIG 196 (H) 04/13/2024      Wt Readings from Last 3 Encounters:  12/25/24 264 lb (119.7 kg)  07/04/24 266 lb 3.2 oz (120.7 kg)  04/27/24 279 lb (126.6 kg)     ASSESSMENT AND PLAN:  Atrial flutter, unspecified type (HCC) -  Recurrence of his arrhythmia in the past several weeks, symptomatic -Compliant with his metoprolol , amiodarone , Eliquis  -Would prefer cardioversion over ablation at this time - Recommend he reload amiodarone  400 twice daily 1 week then cardioversion - For recurrent episodes may need to consider ablation - He prefers to wait on referral to EP at this time  HYPERTENSION, BENIGN -  Blood pressure is well controlled on today's visit. No changes made to the medications.  Paroxysmal atrial fibrillation (HCC) -  Back in atrial fibrillation/flutter as detailed above Plan for amiodarone  load, repeat cardioversion  Hyperlipidemia A1c trending higher, continue Lipitor 40 daily  Chronic diastolic CHF (congestive heart failure) (HCC) appears euvolemic Tolerating torsemide  20 every other day  Uncontrolled type 2 diabetes mellitus with complication, with long-term current Calorie restriction recommended A1c running 10.0 Working with primary care  Primary osteoarthritis of both knees Limited mobility secondary to back and leg discomfort, knee pain  Leg swelling Significant venous component, stable symptoms  Neuropathy, chronic back pain chronic pain History of fusions    Orders Placed This Encounter  Procedures   EKG 12-Lead     Signed, Velinda Lunger, M.D., Ph.D. 12/25/2024  [facility] [place], [place] [phone]

## 2025-01-01 NOTE — Anesthesia Preprocedure Evaluation (Addendum)
 "                                  Anesthesia Evaluation  Patient identified by MRN, date of birth, ID band Patient awake    Reviewed: Allergy & Precautions, NPO status , Patient's Chart, lab work & pertinent test results  History of Anesthesia Complications Negative for: history of anesthetic complications  Airway Mallampati: III  TM Distance: <3 FB Neck ROM: full    Dental  (+) Chipped, Upper Dentures   Pulmonary shortness of breath and with exertion, sleep apnea , former smoker (30 yr history)   Pulmonary exam normal        Cardiovascular Exercise Tolerance: Good hypertension, +CHF (diastolic)  + dysrhythmias Atrial Fibrillation   Echocardiogram June 2021  mild LVH otherwise normal LV size and function    Neuro/Psych  Headaches, Seizures -, Well Controlled,  PSYCHIATRIC DISORDERS  Depression    TIA Neuromuscular disease    GI/Hepatic Neg liver ROS,GERD  Controlled,,  Endo/Other  diabetes, Poorly Controlled, Type 2    Renal/GU Renal InsufficiencyRenal disease  negative genitourinary   Musculoskeletal  (+) Arthritis ,    Abdominal  (+) + obese  Peds  Hematology negative hematology ROS (+)   Anesthesia Other Findings Past Medical History: No date: Arrhythmia     Comment:  tachycardia, A-Fib No date: Arthritis 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 No date: BPH (benign prostatic hyperplasia) No date: Diabetes mellitus 02/2010: DVT (deep venous thrombosis) (HCC)     Comment:  leg thrombus ; dislodged into emboli and caused PE No date: Dyspnea No date: Dysrhythmia No date: 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     Comment:  x2 No date: GERD (gastroesophageal reflux disease) No date: Headache     Comment:  h/o as a child No date: Hypercholesteremia No date: Hypertension No date: Kidney failure     Comment:  acute No date: Neuromuscular disorder (HCC) No date: Neuropathy No date: Pneumonia     Comment:  time 5 ;last episode 12/2015 2011: Pulmonary  embolus (HCC) No date: Seasonal allergies No date: Seizures (HCC)     Comment:  as child  No date: Sleep apnea     Comment:  BIPAP No date: Stiff neck     Comment:  limited turning s/p titanium plate placement No date: TIA (transient ischemic attack) No date: Wears dentures     Comment:  full upper and lower  Past Surgical History: No date: BACK SURGERY     Comment:  x 8; upper x 3 & lower x 5 03/14/13, 10/16: CARDIOVERSION     Comment:  2014 - [facility], 2016 - [facility] 09/11/2020: CARDIOVERSION; N/A     Comment:  Procedure: CARDIOVERSION;  Surgeon: Perla Evalene PARAS,               MD;  Location: [facility] OR[facility];  Service: Cardiovascular;                Laterality: N/A; 10/29/2015: CATARACT EXTRACTION W/PHACO; Left     Comment:  Procedure: CATARACT EXTRACTION PHACO AND INTRAOCULAR               LENS PLACEMENT (IOC);  Surgeon: Dene Etienne, MD;               Location: [facility];  Service: Ophthalmology;                Laterality: Left;  DIABETIC - insulin  and oral medsSleep               apnea - no machine 02/16/2023: CATARACT EXTRACTION W/PHACO; Right     Comment:  Procedure: CATARACT EXTRACTION PHACO AND INTRAOCULAR               LENS PLACEMENT (IOC) RIGHT DIABETIC MALYUGIN HEALON 5               VISION BLUE  65.52  03:57.7;  Surgeon: Mittie Gaskin, MD;  Location: [facility];  Service:               Ophthalmology;  Laterality: Right;  Diabetic No date: CHOLECYSTECTOMY 01/16/2018: COLONOSCOPY WITH PROPOFOL ; N/A     Comment:  Procedure: COLONOSCOPY WITH PROPOFOL ;  Surgeon: Viktoria Lamar DASEN, MD;  Location: [facility] END[facility];  Service:               Endoscopy;  Laterality: N/A; 01/16/2018: ESOPHAGOGASTRODUODENOSCOPY (EGD) WITH PROPOFOL ; N/A     Comment:  Procedure: ESOPHAGOGASTRODUODENOSCOPY (EGD) WITH               PROPOFOL ;  Surgeon: Viktoria Lamar DASEN, MD;  Location:               [facility] ENDOSCOPY;  Service: Endoscopy;  Laterality: N/A; No  date: EYE SURGERY 2002: GALLBLADDER SURGERY 2018: JOINT REPLACEMENT; Right No date: KNEE ARTHROSCOPY     Comment:  left  07/25/2020: PARATHYROIDECTOMY; N/A     Comment:  Procedure: PARATHYROIDECTOMY;  Surgeon: Marolyn Nest, MD;  Location: [facility] ORS;  Service: General;                Laterality: N/A; 2001: ROTATOR CUFF REPAIR     Comment:  left  04/26/2018: TEE WITHOUT CARDIOVERSION; N/A     Comment:  Procedure: TRANSESOPHAGEAL ECHOCARDIOGRAM (TEE);                Surgeon: Mady Bruckner, MD;  Location: [facility] ORS;                Service: Cardiovascular;  Laterality: N/A; No date: TONSILLECTOMY 06/20/2017: TOTAL KNEE ARTHROPLASTY; Right     Comment:  Procedure: RIGHT TOTAL KNEE ARTHROPLASTY;  Surgeon:               Melodi Lerner, MD;  Location: [facility];  Service:               Orthopedics;  Laterality: Right;  BMI    Body Mass Index: 40.15 kg/m      Reproductive/Obstetrics negative OB ROS                              Anesthesia Physical Anesthesia Plan  ASA: 3  Anesthesia Plan: General   Post-op Pain Management: Minimal or no pain anticipated   Induction: Intravenous  PONV Risk Score and Plan: 2 and Propofol  infusion and TIVA  Airway Management Planned: Natural Airway and Nasal Cannula  Additional Equipment: None  Intra-op Plan:   Post-operative Plan:   Informed Consent: I have reviewed the patients History and Physical, chart, labs and discussed the procedure including the risks, benefits and alternatives for the proposed anesthesia with the patient or authorized representative  who has indicated his/her understanding and acceptance.     Dental Advisory Given  Plan Discussed with: Anesthesiologist, CRNA and Surgeon  Anesthesia Plan Comments: (Discussed risks of anesthesia with patient, including possibility of difficulty with spontaneous ventilation under anesthesia necessitating airway intervention, PONV, and rare risks  such as cardiac or respiratory or neurological events, and allergic reactions. Discussed the role of CRNA in patient's perioperative care. Patient understands.)        Anesthesia Quick Evaluation  "

## 2025-01-03 ENCOUNTER — Encounter
Admission: RE | Disposition: A | Discharge status: Home / Self Care | Payer: Self-pay | Source: Home / Self Care | Attending: Cardiovascular Disease

## 2025-01-03 ENCOUNTER — Ambulatory Visit: Payer: Self-pay | Admitting: Anesthesiology

## 2025-01-03 ENCOUNTER — Ambulatory Visit
Admission: RE | Admit: 2025-01-03 | Discharge: 2025-01-03 | Disposition: A | Discharge status: Home / Self Care | Attending: Cardiovascular Disease | Admitting: Cardiovascular Disease

## 2025-01-03 DIAGNOSIS — Z7901 Long term (current) use of anticoagulants: Secondary | ICD-10-CM | POA: Insufficient documentation

## 2025-01-03 DIAGNOSIS — I483 Typical atrial flutter: Secondary | ICD-10-CM

## 2025-01-03 DIAGNOSIS — K219 Gastro-esophageal reflux disease without esophagitis: Secondary | ICD-10-CM | POA: Insufficient documentation

## 2025-01-03 DIAGNOSIS — I4891 Unspecified atrial fibrillation: Secondary | ICD-10-CM | POA: Diagnosis not present

## 2025-01-03 DIAGNOSIS — I5032 Chronic diastolic (congestive) heart failure: Secondary | ICD-10-CM | POA: Insufficient documentation

## 2025-01-03 DIAGNOSIS — E1165 Type 2 diabetes mellitus with hyperglycemia: Secondary | ICD-10-CM | POA: Insufficient documentation

## 2025-01-03 DIAGNOSIS — Z87891 Personal history of nicotine dependence: Secondary | ICD-10-CM | POA: Insufficient documentation

## 2025-01-03 DIAGNOSIS — I4819 Other persistent atrial fibrillation: Secondary | ICD-10-CM

## 2025-01-03 DIAGNOSIS — I110 Hypertensive heart disease with heart failure: Secondary | ICD-10-CM | POA: Insufficient documentation

## 2025-01-03 HISTORY — PX: CARDIOVERSION: SHX1299

## 2025-01-03 LAB — GLUCOSE, CAPILLARY: Glucose-Capillary: 137 mg/dL — ABNORMAL HIGH (ref 70–99)

## 2025-01-03 SURGERY — CARDIOVERSION
Anesthesia: General

## 2025-01-03 MED ORDER — PROPOFOL 10 MG/ML IV BOLUS
INTRAVENOUS | Status: AC
  Filled 2025-01-03: qty 20

## 2025-01-03 MED ORDER — SODIUM CHLORIDE 0.9 % IV SOLN: INTRAVENOUS | Status: DC

## 2025-01-03 MED ORDER — PROPOFOL 10 MG/ML IV BOLUS
INTRAVENOUS | Status: DC | PRN
  Administered 2025-01-03: 80 mg via INTRAVENOUS

## 2025-01-03 MED ORDER — APIXABAN 2.5 MG PO TABS: 5.0000 mg | ORAL_TABLET | Freq: Once | ORAL | Status: DC

## 2025-01-03 NOTE — Transfer of Care (Signed)
 Immediate Anesthesia Transfer of Care Note  Patient: Jerry Parsons  Procedure(s) Performed: CARDIOVERSION  Patient Location: PACU and Nursing Unit  Anesthesia Type:General  Level of Consciousness: drowsy and patient cooperative  Airway & Oxygen  Therapy: Patient Spontanous Breathing and Patient connected to nasal cannula oxygen   Post-op Assessment: Report given to RN and Post -op Vital signs reviewed and stable  Post vital signs: Reviewed and stable  Last Vitals:  Vitals Value Taken Time  BP 130/59 01/03/25 07:45  Temp    Pulse 71 01/03/25 07:45  Resp 12 01/03/25 07:45  SpO2 93 % 01/03/25 07:45  Vitals shown include unfiled device data.  Last Pain:  Vitals:   01/03/25 0656  TempSrc: Tympanic         Complications: No notable events documented.

## 2025-01-03 NOTE — Anesthesia Postprocedure Evaluation (Signed)
"   Anesthesia Post Note  Patient: Jerry Parsons  Procedure(s) Performed: CARDIOVERSION  Patient location during evaluation: Specials Recovery Anesthesia Type: General Level of consciousness: awake and alert Pain management: pain level controlled Vital Signs Assessment: post-procedure vital signs reviewed and stable Respiratory status: spontaneous breathing, nonlabored ventilation, respiratory function stable and patient connected to nasal cannula oxygen  Cardiovascular status: blood pressure returned to baseline and stable Postop Assessment: no apparent nausea or vomiting Anesthetic complications: no   No notable events documented.   Last Vitals:  Vitals:   01/03/25 0802 01/03/25 0816  BP: 96/62 (!) 98/54  Pulse: 71 71  Resp: 19 16  Temp:    SpO2: 92% 95%    Last Pain:  Vitals:   01/03/25 0749  TempSrc: Temporal                 [name]      "

## 2025-01-03 NOTE — CV Procedure (Signed)
Cardioversion procedure note For atrial fibrillation, persistent.  Procedure Details:  Consent: Risks of procedure as well as the alternatives and risks of each were explained to the (patient/caregiver).  Consent for procedure obtained.  Time Out: Verified patient identification, verified procedure, site/side was marked, verified correct patient position, special equipment/implants available, medications/allergies/relevent history reviewed, required imaging and test results available.  Performed  Patient placed on cardiac monitor, pulse oximetry, supplemental oxygen as necessary.   Sedation given: propofol IV, Dr. Lorette Ang Pacer pads placed anterior and posterior chest.   Cardioverted 1 time(s).   Cardioverted at  150 J. Synchronized biphasic Converted to NSR   Evaluation: Findings: Post procedure EKG shows: NSR Complications: None Patient did tolerate procedure well.  Time Spent Directly with the Patient:  45 minutes   Dossie Arbour, M.D., Ph.D.

## 2025-01-04 ENCOUNTER — Telehealth: Payer: Self-pay | Admitting: Cardiovascular Disease

## 2025-01-04 ENCOUNTER — Emergency Department

## 2025-01-04 ENCOUNTER — Inpatient Hospital Stay: Admission: EM | Admit: 2025-01-04 | Discharge: 2025-01-07 | DRG: 919 | Disposition: A | Discharge status: Home Health

## 2025-01-04 ENCOUNTER — Encounter: Payer: Self-pay | Admitting: Cardiovascular Disease

## 2025-01-04 ENCOUNTER — Other Ambulatory Visit: Payer: Self-pay

## 2025-01-04 DIAGNOSIS — E66812 Obesity, class 2: Secondary | ICD-10-CM | POA: Diagnosis present

## 2025-01-04 DIAGNOSIS — I509 Heart failure, unspecified: Secondary | ICD-10-CM

## 2025-01-04 DIAGNOSIS — E669 Obesity, unspecified: Secondary | ICD-10-CM | POA: Diagnosis not present

## 2025-01-04 DIAGNOSIS — K529 Noninfective gastroenteritis and colitis, unspecified: Secondary | ICD-10-CM | POA: Diagnosis present

## 2025-01-04 DIAGNOSIS — M170 Bilateral primary osteoarthritis of knee: Secondary | ICD-10-CM | POA: Diagnosis present

## 2025-01-04 DIAGNOSIS — J9601 Acute respiratory failure with hypoxia: Secondary | ICD-10-CM | POA: Diagnosis present

## 2025-01-04 DIAGNOSIS — I10 Essential (primary) hypertension: Secondary | ICD-10-CM | POA: Diagnosis present

## 2025-01-04 DIAGNOSIS — E1140 Type 2 diabetes mellitus with diabetic neuropathy, unspecified: Secondary | ICD-10-CM | POA: Diagnosis present

## 2025-01-04 DIAGNOSIS — Z86718 Personal history of other venous thrombosis and embolism: Secondary | ICD-10-CM

## 2025-01-04 DIAGNOSIS — Z7901 Long term (current) use of anticoagulants: Secondary | ICD-10-CM

## 2025-01-04 DIAGNOSIS — Z794 Long term (current) use of insulin: Secondary | ICD-10-CM

## 2025-01-04 DIAGNOSIS — T8189XA Other complications of procedures, not elsewhere classified, initial encounter: Principal | ICD-10-CM | POA: Diagnosis present

## 2025-01-04 DIAGNOSIS — E1165 Type 2 diabetes mellitus with hyperglycemia: Secondary | ICD-10-CM | POA: Diagnosis present

## 2025-01-04 DIAGNOSIS — G459 Transient cerebral ischemic attack, unspecified: Secondary | ICD-10-CM | POA: Diagnosis present

## 2025-01-04 DIAGNOSIS — Z8249 Family history of ischemic heart disease and other diseases of the circulatory system: Secondary | ICD-10-CM

## 2025-01-04 DIAGNOSIS — E785 Hyperlipidemia, unspecified: Secondary | ICD-10-CM | POA: Diagnosis not present

## 2025-01-04 DIAGNOSIS — Z791 Long term (current) use of non-steroidal anti-inflammatories (NSAID): Secondary | ICD-10-CM

## 2025-01-04 DIAGNOSIS — E7800 Pure hypercholesterolemia, unspecified: Secondary | ICD-10-CM | POA: Diagnosis present

## 2025-01-04 DIAGNOSIS — I2489 Other forms of acute ischemic heart disease: Secondary | ICD-10-CM | POA: Diagnosis present

## 2025-01-04 DIAGNOSIS — M48061 Spinal stenosis, lumbar region without neurogenic claudication: Secondary | ICD-10-CM

## 2025-01-04 DIAGNOSIS — E1122 Type 2 diabetes mellitus with diabetic chronic kidney disease: Secondary | ICD-10-CM | POA: Diagnosis present

## 2025-01-04 DIAGNOSIS — Z79899 Other long term (current) drug therapy: Secondary | ICD-10-CM

## 2025-01-04 DIAGNOSIS — Z7982 Long term (current) use of aspirin: Secondary | ICD-10-CM | POA: Diagnosis not present

## 2025-01-04 DIAGNOSIS — R079 Chest pain, unspecified: Secondary | ICD-10-CM | POA: Diagnosis present

## 2025-01-04 DIAGNOSIS — G8929 Other chronic pain: Secondary | ICD-10-CM | POA: Diagnosis present

## 2025-01-04 DIAGNOSIS — I5033 Acute on chronic diastolic (congestive) heart failure: Secondary | ICD-10-CM | POA: Diagnosis present

## 2025-01-04 DIAGNOSIS — F32A Depression, unspecified: Secondary | ICD-10-CM | POA: Diagnosis present

## 2025-01-04 DIAGNOSIS — G2581 Restless legs syndrome: Secondary | ICD-10-CM

## 2025-01-04 DIAGNOSIS — I130 Hypertensive heart and chronic kidney disease with heart failure and stage 1 through stage 4 chronic kidney disease, or unspecified chronic kidney disease: Secondary | ICD-10-CM | POA: Diagnosis present

## 2025-01-04 DIAGNOSIS — Z7984 Long term (current) use of oral hypoglycemic drugs: Secondary | ICD-10-CM | POA: Diagnosis not present

## 2025-01-04 DIAGNOSIS — Z8673 Personal history of transient ischemic attack (TIA), and cerebral infarction without residual deficits: Secondary | ICD-10-CM

## 2025-01-04 DIAGNOSIS — K219 Gastro-esophageal reflux disease without esophagitis: Secondary | ICD-10-CM | POA: Diagnosis present

## 2025-01-04 DIAGNOSIS — I82409 Acute embolism and thrombosis of unspecified deep veins of unspecified lower extremity: Secondary | ICD-10-CM | POA: Diagnosis present

## 2025-01-04 DIAGNOSIS — Z96653 Presence of artificial knee joint, bilateral: Secondary | ICD-10-CM | POA: Diagnosis present

## 2025-01-04 DIAGNOSIS — N1831 Chronic kidney disease, stage 3a: Secondary | ICD-10-CM | POA: Diagnosis present

## 2025-01-04 DIAGNOSIS — M7989 Other specified soft tissue disorders: Secondary | ICD-10-CM | POA: Diagnosis present

## 2025-01-04 DIAGNOSIS — G4733 Obstructive sleep apnea (adult) (pediatric): Secondary | ICD-10-CM | POA: Diagnosis present

## 2025-01-04 DIAGNOSIS — N400 Enlarged prostate without lower urinary tract symptoms: Secondary | ICD-10-CM | POA: Diagnosis present

## 2025-01-04 DIAGNOSIS — Z6836 Body mass index (BMI) 36.0-36.9, adult: Secondary | ICD-10-CM | POA: Diagnosis not present

## 2025-01-04 DIAGNOSIS — I480 Paroxysmal atrial fibrillation: Secondary | ICD-10-CM | POA: Diagnosis present

## 2025-01-04 DIAGNOSIS — Z1152 Encounter for screening for COVID-19: Secondary | ICD-10-CM

## 2025-01-04 DIAGNOSIS — Z8701 Personal history of pneumonia (recurrent): Secondary | ICD-10-CM

## 2025-01-04 DIAGNOSIS — I4892 Unspecified atrial flutter: Secondary | ICD-10-CM | POA: Diagnosis present

## 2025-01-04 DIAGNOSIS — R002 Palpitations: Secondary | ICD-10-CM | POA: Diagnosis present

## 2025-01-04 DIAGNOSIS — D696 Thrombocytopenia, unspecified: Secondary | ICD-10-CM | POA: Diagnosis present

## 2025-01-04 DIAGNOSIS — Z96652 Presence of left artificial knee joint: Secondary | ICD-10-CM | POA: Diagnosis present

## 2025-01-04 DIAGNOSIS — H5461 Unqualified visual loss, right eye, normal vision left eye: Secondary | ICD-10-CM | POA: Diagnosis present

## 2025-01-04 DIAGNOSIS — Z87891 Personal history of nicotine dependence: Secondary | ICD-10-CM

## 2025-01-04 DIAGNOSIS — Z86711 Personal history of pulmonary embolism: Secondary | ICD-10-CM

## 2025-01-04 DIAGNOSIS — J189 Pneumonia, unspecified organism: Secondary | ICD-10-CM

## 2025-01-04 DIAGNOSIS — I5032 Chronic diastolic (congestive) heart failure: Secondary | ICD-10-CM

## 2025-01-04 DIAGNOSIS — Z888 Allergy status to other drugs, medicaments and biological substances status: Secondary | ICD-10-CM

## 2025-01-04 DIAGNOSIS — Z9981 Dependence on supplemental oxygen: Secondary | ICD-10-CM

## 2025-01-04 DIAGNOSIS — E1129 Type 2 diabetes mellitus with other diabetic kidney complication: Secondary | ICD-10-CM | POA: Diagnosis present

## 2025-01-04 LAB — CBG MONITORING, ED: Glucose-Capillary: 102 mg/dL — ABNORMAL HIGH (ref 70–99)

## 2025-01-04 LAB — BASIC METABOLIC PANEL WITH GFR
Anion gap: 7 (ref 5–15)
BUN: 24 mg/dL — ABNORMAL HIGH (ref 8–23)
CO2: 31 mmol/L (ref 22–32)
Calcium: 8.7 mg/dL — ABNORMAL LOW (ref 8.9–10.3)
Chloride: 99 mmol/L (ref 98–111)
Creatinine, Ser: 1.29 mg/dL — ABNORMAL HIGH (ref 0.61–1.24)
GFR, Estimated: 58 mL/min — ABNORMAL LOW
Glucose, Bld: 163 mg/dL — ABNORMAL HIGH (ref 70–99)
Potassium: 4.5 mmol/L (ref 3.5–5.1)
Sodium: 138 mmol/L (ref 135–145)

## 2025-01-04 LAB — CBC
HCT: 37.2 % — ABNORMAL LOW (ref 39.0–52.0)
Hemoglobin: 11.3 g/dL — ABNORMAL LOW (ref 13.0–17.0)
MCH: 24 pg — ABNORMAL LOW (ref 26.0–34.0)
MCHC: 30.4 g/dL (ref 30.0–36.0)
MCV: 79.1 fL — ABNORMAL LOW (ref 80.0–100.0)
Platelets: 119 K/uL — ABNORMAL LOW (ref 150–400)
RBC: 4.7 MIL/uL (ref 4.22–5.81)
RDW: 15.6 % — ABNORMAL HIGH (ref 11.5–15.5)
WBC: 8.7 K/uL (ref 4.0–10.5)
nRBC: 0 % (ref 0.0–0.2)

## 2025-01-04 LAB — PROCALCITONIN: Procalcitonin: <0.1 ng/mL

## 2025-01-04 LAB — TROPONIN T, HIGH SENSITIVITY
Troponin T High Sensitivity: 23 ng/L — ABNORMAL HIGH (ref 0–19)
Troponin T High Sensitivity: 20 ng/L — ABNORMAL HIGH (ref 0–19)

## 2025-01-04 LAB — RESP PANEL BY RT-PCR (RSV, FLU A&B, COVID)  RVPGX2
Influenza A by PCR: NEGATIVE
Influenza B by PCR: NEGATIVE
Resp Syncytial Virus by PCR: NEGATIVE
SARS Coronavirus 2 by RT PCR: NEGATIVE

## 2025-01-04 LAB — PROTIME-INR
INR: 1.5 — ABNORMAL HIGH (ref 0.8–1.2)
Prothrombin Time: 19 s — ABNORMAL HIGH (ref 11.4–15.2)

## 2025-01-04 LAB — PRO BRAIN NATRIURETIC PEPTIDE: Pro Brain Natriuretic Peptide: 324 pg/mL — ABNORMAL HIGH

## 2025-01-04 MED ORDER — DM-GUAIFENESIN ER 30-600 MG PO TB12: 1.0000 | ORAL_TABLET | Freq: Two times a day (BID) | ORAL | Status: DC | PRN

## 2025-01-04 MED ORDER — TAMSULOSIN HCL 0.4 MG PO CAPS
0.4000 mg | ORAL_CAPSULE | Freq: Every day | ORAL | Status: DC
  Administered 2025-01-05 – 2025-01-07 (×3): 0.4 mg via ORAL
  Filled 2025-01-04 (×3): qty 1

## 2025-01-04 MED ORDER — FUROSEMIDE 10 MG/ML IJ SOLN
40.0000 mg | Freq: Once | INTRAMUSCULAR | Status: AC
  Administered 2025-01-04: 40 mg via INTRAVENOUS
  Filled 2025-01-04: qty 4

## 2025-01-04 MED ORDER — PREGABALIN 75 MG PO CAPS
200.0000 mg | ORAL_CAPSULE | Freq: Three times a day (TID) | ORAL | Status: DC
  Administered 2025-01-04 – 2025-01-07 (×9): 200 mg via ORAL
  Filled 2025-01-04 (×9): qty 1

## 2025-01-04 MED ORDER — SODIUM CHLORIDE 0.9 % IV SOLN
2.0000 g | Freq: Once | INTRAVENOUS | Status: AC
  Administered 2025-01-04: 2 g via INTRAVENOUS
  Filled 2025-01-04: qty 20

## 2025-01-04 MED ORDER — OXYCODONE HCL ER 10 MG PO T12A: 40.0000 mg | EXTENDED_RELEASE_TABLET | Freq: Once | ORAL | Status: DC

## 2025-01-04 MED ORDER — NITROGLYCERIN 0.4 MG SL SUBL: 0.4000 mg | SUBLINGUAL_TABLET | SUBLINGUAL | Status: DC | PRN

## 2025-01-04 MED ORDER — ASPIRIN 81 MG PO TBEC
81.0000 mg | DELAYED_RELEASE_TABLET | Freq: Every evening | ORAL | Status: DC
  Administered 2025-01-05 – 2025-01-06 (×2): 81 mg via ORAL
  Filled 2025-01-04 (×2): qty 1

## 2025-01-04 MED ORDER — AMIODARONE HCL 200 MG PO TABS
400.0000 mg | ORAL_TABLET | Freq: Two times a day (BID) | ORAL | Status: DC
  Administered 2025-01-04 – 2025-01-07 (×6): 400 mg via ORAL
  Filled 2025-01-04 (×6): qty 2

## 2025-01-04 MED ORDER — ACETAMINOPHEN 325 MG PO TABS
650.0000 mg | ORAL_TABLET | Freq: Four times a day (QID) | ORAL | Status: DC | PRN
  Administered 2025-01-05: 650 mg via ORAL
  Filled 2025-01-04: qty 2

## 2025-01-04 MED ORDER — ROPINIROLE HCL 1 MG PO TABS: 2.0000 mg | ORAL_TABLET | Freq: Every day | ORAL | Status: DC

## 2025-01-04 MED ORDER — GABAPENTIN 300 MG PO CAPS
300.0000 mg | ORAL_CAPSULE | Freq: Every day | ORAL | Status: DC
  Administered 2025-01-05 – 2025-01-06 (×2): 300 mg via ORAL
  Filled 2025-01-04 (×2): qty 1

## 2025-01-04 MED ORDER — INSULIN ASPART 100 UNIT/ML IJ SOLN
0.0000 [IU] | Freq: Every day | INTRAMUSCULAR | Status: DC
  Administered 2025-01-04: 0 [IU] via SUBCUTANEOUS
  Administered 2025-01-05: 3 [IU] via SUBCUTANEOUS
  Filled 2025-01-04: qty 3

## 2025-01-04 MED ORDER — MAGNESIUM OXIDE -MG SUPPLEMENT 400 (240 MG) MG PO TABS
400.0000 mg | ORAL_TABLET | Freq: Two times a day (BID) | ORAL | Status: DC
  Administered 2025-01-05 – 2025-01-07 (×5): 400 mg via ORAL
  Filled 2025-01-04 (×5): qty 1

## 2025-01-04 MED ORDER — INSULIN ASPART 100 UNIT/ML IJ SOLN
0.0000 [IU] | Freq: Three times a day (TID) | INTRAMUSCULAR | Status: DC
  Administered 2025-01-05: 3 [IU] via SUBCUTANEOUS
  Administered 2025-01-05: 1 [IU] via SUBCUTANEOUS
  Administered 2025-01-05 – 2025-01-06 (×4): 2 [IU] via SUBCUTANEOUS
  Administered 2025-01-07: 1 [IU] via SUBCUTANEOUS
  Filled 2025-01-04 (×3): qty 2
  Filled 2025-01-04: qty 1
  Filled 2025-01-04: qty 3
  Filled 2025-01-04: qty 2
  Filled 2025-01-04: qty 1

## 2025-01-04 MED ORDER — DOXYCYCLINE HYCLATE 100 MG PO TABS
100.0000 mg | ORAL_TABLET | Freq: Once | ORAL | Status: AC
  Administered 2025-01-04: 100 mg via ORAL
  Filled 2025-01-04: qty 1

## 2025-01-04 MED ORDER — ALBUTEROL SULFATE (2.5 MG/3ML) 0.083% IN NEBU: 3.0000 mL | INHALATION_SOLUTION | RESPIRATORY_TRACT | Status: DC | PRN

## 2025-01-04 MED ORDER — OXYCODONE HCL ER 10 MG PO T12A
40.0000 mg | EXTENDED_RELEASE_TABLET | Freq: Three times a day (TID) | ORAL | Status: DC
  Administered 2025-01-04 – 2025-01-07 (×8): 40 mg via ORAL
  Filled 2025-01-04 (×8): qty 4

## 2025-01-04 MED ORDER — APIXABAN 5 MG PO TABS
5.0000 mg | ORAL_TABLET | Freq: Two times a day (BID) | ORAL | Status: DC
  Administered 2025-01-05 – 2025-01-07 (×5): 5 mg via ORAL
  Filled 2025-01-04 (×5): qty 1

## 2025-01-04 MED ORDER — HYDRALAZINE HCL 20 MG/ML IJ SOLN: 5.0000 mg | INTRAMUSCULAR | Status: DC | PRN

## 2025-01-04 MED ORDER — MORPHINE SULFATE (PF) 2 MG/ML IV SOLN
2.0000 mg | INTRAVENOUS | Status: DC | PRN
  Administered 2025-01-04: 2 mg via INTRAVENOUS
  Filled 2025-01-04: qty 1

## 2025-01-04 MED ORDER — INSULIN GLARGINE-YFGN 100 UNIT/ML ~~LOC~~ SOLN
38.0000 [IU] | Freq: Two times a day (BID) | SUBCUTANEOUS | Status: DC
  Administered 2025-01-05 – 2025-01-07 (×5): 38 [IU] via SUBCUTANEOUS
  Filled 2025-01-04 (×6): qty 0.38

## 2025-01-04 MED ORDER — NORTRIPTYLINE HCL 10 MG PO CAPS
30.0000 mg | ORAL_CAPSULE | Freq: Every day | ORAL | Status: DC
  Administered 2025-01-05 – 2025-01-06 (×2): 30 mg via ORAL
  Filled 2025-01-04 (×3): qty 3

## 2025-01-04 MED ORDER — ONDANSETRON HCL 4 MG/2ML IJ SOLN: 4.0000 mg | Freq: Three times a day (TID) | INTRAMUSCULAR | Status: DC | PRN

## 2025-01-04 MED ORDER — IPRATROPIUM-ALBUTEROL 0.5-2.5 (3) MG/3ML IN SOLN
3.0000 mL | RESPIRATORY_TRACT | Status: DC
  Administered 2025-01-04 – 2025-01-07 (×15): 3 mL via RESPIRATORY_TRACT
  Filled 2025-01-04 (×16): qty 3

## 2025-01-04 MED ORDER — DUTASTERIDE 0.5 MG PO CAPS
0.5000 mg | ORAL_CAPSULE | Freq: Every day | ORAL | Status: DC
  Administered 2025-01-05 – 2025-01-07 (×3): 0.5 mg via ORAL
  Filled 2025-01-04 (×3): qty 1

## 2025-01-04 NOTE — ED Provider Notes (Signed)
 "  [facility] Provider Note    Event Date/Time   First MD Initiated Contact with Patient 01/04/25 1701     (approximate)   History   Chest Pain   HPI  Jerry Parsons is a 74 y.o. male with history of atrial flutter who is status post cardioversion yesterday who comes in with concerns for chest pain, shortness of breath.  I reviewed the note from yesterday 01/03/2025 where patient underwent cardioversion.  Patient is on Eliquis , amiodarone  and medications for diabetes.  Patient is also on oxycodone  40, torsemide .  Patient reports that after the procedure he started having some increasing pain on his left chest wall worse with movement of his arm.  He also reports having some increasing shortness of breath.  He denies having any cough but somebody in the house does have the flu and patient also reports getting recurrent pneumonias.  He reports his leg swelling is at baseline.  Physical Exam   Triage Vital Signs: ED Triage Vitals [01/04/25 1627]  Encounter Vitals Group     BP (!) 147/67     Girls Systolic BP Percentile      Girls Diastolic BP Percentile      Boys Systolic BP Percentile      Boys Diastolic BP Percentile      Pulse Rate 83     Resp (!) 22     Temp 99.6 F (37.6 C)     Temp Source Oral     SpO2 (!) 83 %     Weight      Height      Head Circumference      Peak Flow      Pain Score 9     Pain Loc      Pain Education      Exclude from Growth Chart     Most recent vital signs: Vitals:   01/04/25 1627 01/04/25 1646  BP: (!) 147/67   Pulse: 83   Resp: (!) 22   Temp: 99.6 F (37.6 C)   SpO2: (!) 83% 90%     General: Awake, no distress.  CV:  Good peripheral perfusion.  Chest wall tenderness with palpation.  Pain worsen with movement of the left arm Resp:  Normal effort.  No wheezing Abd:  No distention.  Soft and nontender Other:  1+ edema bilaterally.  Good distal pulses throughout.  Sensation intact throughout   ED Results /  Procedures / Treatments   Labs (all labs ordered are listed, but only abnormal results are displayed) Labs Reviewed  CBC - Abnormal; Notable for the following components:      Result Value   Hemoglobin 11.3 (*)    HCT 37.2 (*)    MCV 79.1 (*)    MCH 24.0 (*)    RDW 15.6 (*)    Platelets 119 (*)    All other components within normal limits  RESP PANEL BY RT-PCR (RSV, FLU A&B, COVID)  RVPGX2  BASIC METABOLIC PANEL WITH GFR  PROTIME-INR  PRO BRAIN NATRIURETIC PEPTIDE  TROPONIN T, HIGH SENSITIVITY     EKG  My interpretation of EKG:  Normal sinus rate of 83 without any ST elevation or T wave versions, incomplete right bundle branch block  RADIOLOGY I have reviewed the xray personally and interpreted possible pneumonia on the right middle lobe.   PROCEDURES:  Critical Care performed: Yes, see critical care procedure note(s)  .1-3 Lead EKG Interpretation  Performed by: Ernest Ronal BRAVO,  MD Authorized by: Ernest Ronal BRAVO, MD     Interpretation: normal     ECG rate:  60   ECG rate assessment: normal     Rhythm: sinus rhythm     Ectopy: none     Conduction: normal   .Critical Care  Performed by: Ernest Ronal BRAVO, MD Authorized by: Ernest Ronal BRAVO, MD   Critical care provider statement:    Critical care time (minutes):  30   Critical care was necessary to treat or prevent imminent or life-threatening deterioration of the following conditions:  Respiratory failure   Critical care was time spent personally by me on the following activities:  Development of treatment plan with patient or surrogate, discussions with consultants, evaluation of patient's response to treatment, examination of patient, ordering and review of laboratory studies, ordering and review of radiographic studies, ordering and performing treatments and interventions, pulse oximetry, re-evaluation of patient's condition and review of old charts    MEDICATIONS ORDERED IN ED: Medications  cefTRIAXone  (ROCEPHIN ) 2 g  in sodium chloride  0.9 % 100 mL IVPB (2 g Intravenous New Bag/Given 01/04/25 1817)  oxyCODONE  (OXYCONTIN ) 12 hr tablet 40 mg (has no administration in time range)  furosemide  (LASIX ) injection 40 mg (40 mg Intravenous Given 01/04/25 1813)  doxycycline  (VIBRA -TABS) tablet 100 mg (100 mg Oral Given 01/04/25 1813)     IMPRESSION / MDM / ASSESSMENT AND PLAN / ED COURSE  I reviewed the triage vital signs and the nursing notes.   Patient's presentation is most consistent with acute presentation with potential threat to life or bodily function.    Patient was hypoxic into the 80s and placed on 3 L.  Workup was done to evaluate for COVID flu, heart attack, CHF.  Chest x-ray to make sure no evidence of any pneumonia or pneumothorax.  Chest pain seems musculoskeletal in nature but cardiac markers were ordered to make sure no evidence of ACS.  No numbness, tingling, pulse deficit to suggest dissection.  Given reproducible chest pain this seems less likely.  CBC shows stable hemoglobin.  White count is normal  Patient's x-ray consistent with pneumonia patient does not meet sepsis criteria so blood cultures, lactate were not ordered.  He does have some interstitial pulmonary edema so rather than giving fluids and going to give him a dose of IV Lasix .  Discussed with patient need for admission due to concern for new hypoxia.  His COVID and flu test are still pending.  He expressed understanding felt comfortable with plan.  Troponin slightly elevated will need to continue to trend out.  BNP slightly elevated.  BMP shows creatinine downtrending from baseline at 1 point  The patient is on the cardiac monitor to evaluate for evidence of arrhythmia and/or significant heart rate changes.      FINAL CLINICAL IMPRESSION(S) / ED DIAGNOSES   Final diagnoses:  Acute respiratory failure with hypoxia (HCC)  Pneumonia of right middle lobe due to infectious organism  Acute congestive heart failure, unspecified heart  failure type (HCC)     Rx / DC Orders   ED Discharge Orders     None        Note:  This document was prepared using Dragon voice recognition software and may include unintentional dictation errors.   Ernest Ronal BRAVO, MD 01/04/25 1826  "

## 2025-01-04 NOTE — ED Triage Notes (Signed)
 Pt had cardioversion yesterday. About 4 hours after, pt began having CP and SOB. SOB has worsened today. Mid CP that radiates to left around the shoulder. O2 83% RA, placed on 3L.

## 2025-01-04 NOTE — Telephone Encounter (Signed)
 Pt c/o Shortness Of Breath: STAT if SOB developed within the last 24 hours or pt is noticeably SOB on the phone  1. Are you currently SOB (can you hear that pt is SOB on the phone)?   Patient stated yes  2. How long have you been experiencing SOB?   Started yesterday  3. Are you SOB when sitting or when up moving around?   Patient stated when he is up and moving around  4. Are you currently experiencing any other symptoms?   A little chest pain    Pt c/o of Chest Pain: STAT if active CP, including tightness, pressure, jaw pain, radiating pain to shoulder/upper arm/back, CP unrelieved by Nitro. Symptoms reported of SOB, nausea, vomiting, sweating.  1. Are you having CP right now?   Yes   2. Are you experiencing any other symptoms (ex. SOB, nausea, vomiting, sweating)?   SOB, sweating a little  3. Is your CP continuous or coming and going?   Continuous  4. Have you taken Nitroglycerin ?   No  5. How long have you been experiencing CP?   Patient stated the chest pain start about 4 hours after he left the hospital yesterday    6. If NO CP at time of call then end call with telling Pt to call back or call 911 if Chest pain returns prior to return call from triage team.

## 2025-01-04 NOTE — H&P (Incomplete)
 " History and Physical    Jerry Parsons FMW:[id number] DOB: [birth year] DOA: 01/04/2025  Referring MD/NP/PA:   PCP: Bertrum Charlie CROME, MD   Patient coming from:  The patient is coming from home.     Chief Complaint: SOB and chest pain  HPI: Jerry Parsons is a 74 y.o. male with medical history significant of A-fib and PE/DVT on Eliquis , dCHF, HTN, HLD, DM, TIA, depression, BPH, CKD-3A, , thrombocytopenia, chronic pain, right eye blindness, obesity, former smoker, who presents with chest pain and SOB.  Pt had A fib cardioversion procedure yesterday. About 4 hours after, pt began having CP and SOB.   The chest pain is located in the left side of chest where the conversion shock was delivered.  Her shortness of breath has been progressively worsening today.  Patient is normally not using oxygen  but was found to have moderate acute respiratory distress, oxygen  desaturation to 74 and 83% on room air, which improved to 97% on 3 L oxygen .     Data reviewed independently and ED Course: pt was found to have WBC 8.7, BNP 324, negative PCR for COVID, flu and RSV, stable renal function, troponin 23.  Temperature normal, blood pressure 130/49, heart rate 60-80s, RR 22.  Patient is admitted to telemetry bed as inpatient.  Chest x-ray: 1. Right midlung pneumonia. 2. Small right pleural effusion. 3. Interstitial pulmonary edema.     EKG: I have personally reviewed.  Sinus rhythm, QTc 470, early R wave progression, no ischemic change.   Review of Systems:   General: no fevers, chills, no body weight gain, has fatigue HEENT: no blurry vision, hearing changes or sore throat Respiratory: no dyspnea, coughing, wheezing CV: no chest pain, no palpitations GI: no nausea, vomiting, abdominal pain, diarrhea, constipation GU: no dysuria, burning on urination, increased urinary frequency, hematuria  Ext: no leg edema Neuro: no unilateral weakness, numbness, or tingling, no vision change or hearing  loss Skin: no rash, no skin tear. MSK: No muscle spasm, no deformity, no limitation of range of movement in spin Heme: No easy bruising.  Travel history: No recent long distant travel.   Allergy: Allergies[1]  Past Medical History:  Diagnosis Date   Arthritis    Atrial flutter (HCC)    a. 12/2023 s/p DCCV.   Blind right eye    BPH (benign prostatic hyperplasia)    Chest pain    a. 03/2024 MV: EF 56%, mild cor Ca2+. No ischemia/infarct.   Chronic heart failure with preserved ejection fraction (HFpEF) (HCC)    a. 01/2024 Echo: EF 60-65%, no rwma, nl RV fxn, mildly dil LA, mild MR, AoV sclerosis w/o stenosis.   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 06/2024 Zio: avg HR of 69 bpm (49-105), rare PACs and PVCs without triggered events.   Pulmonary embolus (HCC) 2011   Seasonal allergies    Seizures (HCC)    as child    Sleep  apnea    BIPAP   Stiff neck    limited turning s/p titanium plate placement   TIA (transient ischemic attack)    Wears dentures    full upper and lower    Past Surgical History:  Procedure Laterality Date   BACK SURGERY     x 8; upper x 3 & lower x 5   CARDIOVERSION  03/14/13, 10/16   2014 - [facility], 2016 - [facility]   CARDIOVERSION N/A 09/11/2020   Procedure: CARDIOVERSION;  Surgeon: Perla Evalene PARAS, MD;  Location: [facility];  Service: Cardiovascular;  Laterality: N/A;   CARDIOVERSION N/A 01/25/2024   Procedure: CARDIOVERSION;  Surgeon: Perla Evalene PARAS, MD;  Location: [facility];  Service: Cardiovascular;  Laterality: N/A;   CARDIOVERSION N/A 01/03/2025    Procedure: CARDIOVERSION;  Surgeon: Perla Evalene PARAS, MD;  Location: [facility];  Service: Cardiovascular;  Laterality: N/A;   CATARACT EXTRACTION W/PHACO Left 10/29/2015   Procedure: CATARACT EXTRACTION PHACO AND INTRAOCULAR LENS PLACEMENT (IOC);  Surgeon: Dene Etienne, MD;  Location: [facility];  Service: Ophthalmology;  Laterality: Left;  DIABETIC - insulin  and oral medsSleep apnea - no machine   CATARACT EXTRACTION W/PHACO Right 02/16/2023   Procedure: CATARACT EXTRACTION PHACO AND INTRAOCULAR LENS PLACEMENT (IOC) RIGHT DIABETIC MALYUGIN HEALON 5 VISION BLUE  65.52  03:57.7;  Surgeon: Etienne Dene, MD;  Location: [facility];  Service: Ophthalmology;  Laterality: Right;  Diabetic   CHOLECYSTECTOMY     COLONOSCOPY WITH PROPOFOL  N/A 01/16/2018   Procedure: COLONOSCOPY WITH PROPOFOL ;  Surgeon: Viktoria Lamar DASEN, MD;  Location: [facility] END[facility];  Service: Endoscopy;  Laterality: N/A;   ESOPHAGOGASTRODUODENOSCOPY (EGD) WITH PROPOFOL  N/A 01/16/2018   Procedure: ESOPHAGOGASTRODUODENOSCOPY (EGD) WITH PROPOFOL ;  Surgeon: Viktoria Lamar DASEN, MD;  Location: [facility] END[facility];  Service: Endoscopy;  Laterality: N/A;   EYE SURGERY     GALLBLADDER SURGERY  2002   JOINT REPLACEMENT Right 2018   KNEE ARTHROSCOPY     left    PARATHYROIDECTOMY N/A 07/25/2020   Procedure: PARATHYROIDECTOMY;  Surgeon: Marolyn Nest, MD;  Location: [facility];  Service: General;  Laterality: N/A;   ROTATOR CUFF REPAIR  2001   left    TEE WITHOUT CARDIOVERSION N/A 04/26/2018   Procedure: TRANSESOPHAGEAL ECHOCARDIOGRAM (TEE);  Surgeon: Mady Bruckner, MD;  Location: [facility];  Service: Cardiovascular;  Laterality: N/A;   TONSILLECTOMY     TOTAL KNEE ARTHROPLASTY Right 06/20/2017   Procedure: RIGHT TOTAL KNEE ARTHROPLASTY;  Surgeon: Melodi Lerner, MD;  Location: [facility];  Service: Orthopedics;  Laterality: Right;    Social History:  reports that he quit smoking about 35 years ago. His smoking use included  cigarettes. He started smoking about 55 years ago. He has a 40 pack-year smoking history. He has never used smokeless tobacco. He reports that he does not drink alcohol and does not use drugs.  Family History:  Family History  Problem Relation Age of Onset   Heart attack Brother      Prior to Admission medications  Medication Sig Start Date End Date Taking? Authorizing Provider  amiodarone  (PACERONE ) 200 MG tablet 400 mg (2 tabs) twice daily for 1 week, 200 mg (1 tab) twice daily for 1 week, then 200 mg once daily as directed by Dr Perla Patient taking differently: Take 400 mg by mouth in the morning and at bedtime. 12/25/24   Gollan, Timothy J, MD  ASPIRIN  ADULT LOW DOSE 81 MG tablet Take 81 mg by mouth every evening.    [provider]  atorvastatin  (LIPITOR) 40 MG tablet Take 1 tablet (40 mg total) by mouth daily. Patient not taking: Reported on 12/31/2024 04/15/24   Darci Pore, MD  docusate sodium  (COLACE) 100 MG capsule Take 100 mg by mouth See admin instructions. Twice daily on Mon, Wed, and Fri    [provider]  dutasteride  (AVODART ) 0.5 MG capsule Take 0.5 mg by mouth daily. 07/06/23   [provider]  ELIQUIS  5 MG TABS tablet TAKE 1 TABLET TWICE A DAY 11/16/24   Gollan, Timothy J, MD  FARXIGA  10 MG TABS tablet TAKE 1 TABLET BY MOUTH EVERY DAY 10/04/22   Ostwalt, Janna, PA-C  fexofenadine (ALLEGRA) 180 MG tablet Take 180 mg by mouth daily.    [provider]  gabapentin  (NEURONTIN ) 100 MG capsule Take 100 mg by mouth 2 (two) times daily. 100 mg in the morning, 100 mg 3p    [provider]  gabapentin  (NEURONTIN ) 300 MG capsule Take 1 capsule (300 mg total) by mouth at bedtime. 11/12/22   Gasper Nancyann BRAVO, MD  Insulin  Glargine (BASAGLAR  KWIKPEN) 100 UNIT/ML INJECT 40 UNITS            SUBCUTANEOUSLY TWO TIMES A DAY Patient taking differently: Inject 48 Units into the skin 2 (two) times daily. 05/31/23   Simmons-Robinson, Makiera, MD   magnesium  oxide (MAG-OX) 400 (240 Mg) MG tablet Take 1 tablet by mouth 2 (two) times daily. 02/29/24   [provider]  metoprolol  tartrate (LOPRESSOR ) 50 MG tablet Take 50 mg by mouth 2 (two) times daily. 10/27/15   [provider]  mupirocin  ointment (BACTROBAN ) 2 % Apply 1 Application topically 2 (two) times daily. 11/26/24   [provider]  naloxone  (NARCAN ) nasal spray 4 mg/0.1 mL Please use in the event of overdose from narcotic medication 12/29/22   Simmons-Robinson, Rockie, MD  nortriptyline  (PAMELOR ) 10 MG capsule Take 30 mg by mouth at bedtime. 03/13/24   [provider]  NOVOLOG  FLEXPEN 100 UNIT/ML FlexPen INJECT 14 UNITS            SUBCUTANEOUSLY IN THE      MORNING AND AT BEDTIME 04/26/22   Bertrum Charlie CROME, MD  Omega-3 Fatty Acids (FISH OIL) 1200 MG CAPS Take 1,200 mg by mouth 2 (two) times daily.    [provider]  [facility] ULTRA test strip USE WITH METER TWICE DAILY 09/13/22   Bertrum Charlie CROME, MD  oxyCODONE  (OXYCONTIN ) 40 mg 12 hr tablet Take 1 tablet (40 mg total) by mouth every 8 (eight) hours as needed. Patient taking differently: Take 40 mg by mouth every 8 (eight) hours. 12/09/22   Simmons-Robinson, Makiera, MD  polyethylene glycol (MIRALAX  / GLYCOLAX ) 17 g packet Take 17 g by mouth daily as needed for mild constipation or moderate constipation.    [provider]  pregabalin  (LYRICA ) 200 MG capsule TAKE 1 CAPSULE 3 TIMES A   DAY 09/10/22   Bertrum Charlie CROME, MD  rOPINIRole  (REQUIP ) 1 MG tablet TAKE 1 TO 2 TABLETS BY MOUTH AT BEDTIME AS NEEDED Patient taking differently: Take 1 mg by mouth 2 (two) times daily. 1 mg in the morning, 1 mg at 3p 07/21/22   Bertrum Charlie CROME, MD  rOPINIRole  (REQUIP ) 2 MG tablet TAKE 1 TABLET BY MOUTH TWICE A DAY AS NEEDED Patient taking differently: Take 2 mg by mouth at bedtime. 09/07/22   Bertrum Charlie CROME, MD  sitaGLIPtin  (JANUVIA ) 100 MG tablet Take 100 mg by mouth daily. 12/26/15  [provider]  tamsulosin  (FLOMAX ) 0.4 MG CAPS capsule Take 0.4 mg by mouth daily. 10/12/20   [provider]  torsemide  (DEMADEX ) 20 MG tablet Take 1 tablet (20 mg total) by mouth every other day. Take one tablet as needed for a weight gain of 2 lbs in 24 hrs or 5 lbs in a week. 04/14/24   Darci Pore, MD    Physical Exam: Vitals:   01/04/25 1627 01/04/25 1646 01/04/25 1726 01/04/25 1821  BP: (!) 147/67   (!) 130/49  Pulse: 83   65  Resp: (!) 22   15  Temp: 99.6 F (37.6 C)   98.8 F (37.1 C)  TempSrc: Oral   Oral  SpO2: (!) 83% 90% 95% 97%   General: Not in acute distress HEENT:       Eyes: PERRL, EOMI, no jaundice       ENT: No discharge from the ears and nose, no pharynx injection, no tonsillar enlargement.        Neck: No JVD, no bruit, no mass felt. Heme: No neck lymph node enlargement. Cardiac: S1/S2, RRR, No murmurs, No gallops or rubs. Respiratory: No rales, wheezing, rhonchi or rubs. GI: Soft, nondistended, nontender, no rebound pain, no organomegaly, BS present. GU: No hematuria Ext: No pitting leg edema bilaterally. 1+DP/PT pulse bilaterally. Musculoskeletal: No joint deformities, No joint redness or warmth, no limitation of ROM in spin. Skin: No rashes.  Neuro: Alert, oriented X3, cranial nerves II-XII grossly intact, moves all extremities normally.  Psych: Patient is not psychotic, no suicidal or hemocidal ideation.  Labs on Admission: I have personally reviewed following labs and imaging studies  CBC: Recent Labs  Lab 01/04/25 1632  WBC 8.7  HGB 11.3*  HCT 37.2*  MCV 79.1*  PLT 119*   Basic Metabolic Panel: Recent Labs  Lab 01/04/25 1632  NA 138  K 4.5  CL 99  CO2 31  GLUCOSE 163*  BUN 24*  CREATININE 1.29*  CALCIUM  8.7*   GFR: Estimated Creatinine Clearance: 66.2 mL/min (A) (by C-G formula based on SCr of 1.29 mg/dL (H)). Liver Function Tests: No results for input(s): AST, ALT, ALKPHOS, BILITOT, PROT, ALBUMIN  in the last 168 hours. No results for input(s): LIPASE, AMYLASE in the last 168 hours. No results for input(s): AMMONIA in the last 168 hours. Coagulation Profile: Recent Labs  Lab 01/04/25 1632  INR 1.5*   Cardiac Enzymes: No results for input(s): CKTOTAL, CKMB, CKMBINDEX, TROPONINI in the last 168 hours. BNP (last 3 results) Recent Labs    01/04/25 1632  PROBNP 324.0*   HbA1C: No results for input(s): HGBA1C in the last 72 hours. CBG: Recent Labs  Lab 01/03/25 0714  GLUCAP 137*   Lipid Profile: No results for input(s): CHOL, HDL, LDLCALC, TRIG, CHOLHDL, LDLDIRECT in the last 72 hours. Thyroid  Function Tests: No results for input(s): TSH, T4TOTAL, FREET4, T3FREE, THYROIDAB in the last 72 hours. Anemia Panel: No results for input(s): VITAMINB12, FOLATE, FERRITIN, TIBC, IRON, RETICCTPCT in the last 72 hours. Urine analysis:    Component Value Date/Time   COLORURINE YELLOW (A) 04/22/2018 0255   APPEARANCEUR Clear 09/28/2023 0924   LABSPEC 1.016 04/22/2018 0255   LABSPEC 1.015 04/12/2013 1256   PHURINE 5.0 04/22/2018 0255   GLUCOSEU Trace (A) 09/28/2023 0924   GLUCOSEU >=500 04/12/2013 1256   HGBUR NEGATIVE 04/22/2018 0255   BILIRUBINUR Negative 09/28/2023 0924   BILIRUBINUR Negative 04/12/2013 1256   KETONESUR 5 (A) 04/22/2018 0255   PROTEINUR 1+ (  A) 09/28/2023 0924   PROTEINUR NEGATIVE 04/22/2018 0255   UROBILINOGEN 0.2 09/18/2020 1310   UROBILINOGEN 0.2 02/05/2010 1041   NITRITE Negative 09/28/2023 0924   NITRITE NEGATIVE 04/22/2018 0255   LEUKOCYTESUR Negative 09/28/2023 0924   LEUKOCYTESUR Negative 04/12/2013 1256   Sepsis Labs: @LABRCNTIP (procalcitonin:4,lacticidven:4) ) Recent Results (from the past 240 hours)  Resp panel by RT-PCR (RSV, Flu A&B, Covid) Anterior Nasal Swab     Status: None   Collection Time: 01/04/25  5:29 PM   Specimen: Anterior Nasal Swab  Result Value Ref Range Status   SARS  Coronavirus 2 by RT PCR NEGATIVE NEGATIVE Final    Comment: (NOTE) SARS-CoV-2 target nucleic acids are NOT DETECTED.  The SARS-CoV-2 RNA is generally detectable in upper respiratory specimens during the acute phase of infection. The lowest concentration of SARS-CoV-2 viral copies this assay can detect is 138 copies/mL. A negative result does not preclude SARS-Cov-2 infection and should not be used as the sole basis for treatment or other patient management decisions. A negative result may occur with  improper specimen collection/handling, submission of specimen other than nasopharyngeal swab, presence of viral mutation(s) within the areas targeted by this assay, and inadequate number of viral copies(<138 copies/mL). A negative result must be combined with clinical observations, patient history, and epidemiological information. The expected result is Negative.  Fact Sheet for Patients:  bloggercourse.com  Fact Sheet for Healthcare Providers:  seriousbroker.it  This test is no t yet approved or cleared by the [place]  FDA and  has been authorized for detection and/or diagnosis of SARS-CoV-2 by FDA under an Emergency Use Authorization (EUA). This EUA will remain  in effect (meaning this test can be used) for the duration of the COVID-19 declaration under Section 564(b)(1) of the Act, 21 U.S.C.section 360bbb-3(b)(1), unless the authorization is terminated  or revoked sooner.       Influenza A by PCR NEGATIVE NEGATIVE Final   Influenza B by PCR NEGATIVE NEGATIVE Final    Comment: (NOTE) The Xpert Xpress SARS-CoV-2/FLU/RSV plus assay is intended as an aid in the diagnosis of influenza from Nasopharyngeal swab specimens and should not be used as a sole basis for treatment. Nasal washings and aspirates are unacceptable for Xpert Xpress SARS-CoV-2/FLU/RSV testing.  Fact Sheet for  Patients: bloggercourse.com  Fact Sheet for Healthcare Providers: seriousbroker.it  This test is not yet approved or cleared by the [place]  FDA and has been authorized for detection and/or diagnosis of SARS-CoV-2 by FDA under an Emergency Use Authorization (EUA). This EUA will remain in effect (meaning this test can be used) for the duration of the COVID-19 declaration under Section 564(b)(1) of the Act, 21 U.S.C. section 360bbb-3(b)(1), unless the authorization is terminated or revoked.     Resp Syncytial Virus by PCR NEGATIVE NEGATIVE Final    Comment: (NOTE) Fact Sheet for Patients: bloggercourse.com  Fact Sheet for Healthcare Providers: seriousbroker.it  This test is not yet approved or cleared by the [place]  FDA and has been authorized for detection and/or diagnosis of SARS-CoV-2 by FDA under an Emergency Use Authorization (EUA). This EUA will remain in effect (meaning this test can be used) for the duration of the COVID-19 declaration under Section 564(b)(1) of the Act, 21 U.S.C. section 360bbb-3(b)(1), unless the authorization is terminated or revoked.  Performed at [facility], [address] [place], [place] [address]      Radiological Exams on Admission:   Assessment/Plan Principal Problem:   Acute respiratory failure with hypoxia [facility]) Active  Problems:   Acute on chronic diastolic congestive heart failure (HCC)   Chest pain   HTN (hypertension)   Hyperlipidemia   Paroxysmal atrial fibrillation (HCC)   DVT (deep venous thrombosis) (HCC)   Hx of pulmonary embolus   TIA (transient ischemic attack)   Thrombocytopenia   Type II diabetes mellitus with renal manifestations (HCC)   Chronic kidney disease, stage 3a (HCC)   OSA on CPAP   Depression   Obesity (BMI 30-39.9)   Assessment and Plan: No notes have been filed under this  hospital service. Service: Hospitalist      Principal Problem:   Acute respiratory failure with hypoxia (HCC) Active Problems:   Acute on chronic diastolic congestive heart failure (HCC)   Chest pain   HTN (hypertension)   Hyperlipidemia   Paroxysmal atrial fibrillation (HCC)   DVT (deep venous thrombosis) (HCC)   Hx of pulmonary embolus   TIA (transient ischemic attack)   Thrombocytopenia   Type II diabetes mellitus with renal manifestations (HCC)   Chronic kidney disease, stage 3a (HCC)   OSA on CPAP   Depression   Obesity (BMI 30-39.9)    DVT ppx: on Eliquis   ***  Code Status: Full code   ***  Family Communication:     not done, no family member is at bed side.              Yes, patient's    at bed side.       by phone   ***  Disposition Plan:  Anticipate discharge back to previous environment  Consults called:    Admission status and Level of care: Telemetry:    for obs as inpt        Dispo: The patient is from: {From:23814}              Anticipated d/c is to: {To:23815}              Anticipated d/c date is: {Days:23816}              Patient currently {Medically stable:23817}    Severity of Illness:  {Observation/Inpatient:21159}       Date of Service 01/04/2025    Caleb Exon [facility]   If 7PM-7AM, please contact night-coverage [email or web address] 01/04/2025, 7:00 PM     [1]  Allergies Allergen Reactions   Carisoprodol Itching and Nausea And Vomiting    Reaction:  Unknown   Sensitivity   Reaction:  Unknown     Reaction:  Unknown     Sensitivity     Reaction:  Unknown   "

## 2025-01-04 NOTE — Telephone Encounter (Signed)
 Answered an incoming call on the STAT phone.  Patient complained about being short of breath and chest pain that feels like something heavy on my chest radiating to left shoulder since yesterday after the cardioversion.  Patient states taking all heart medications as prescribed.  Patient also states taking pain pill for aching pain this morning that has not resolved the chest pain.  Patient advised to have someone take him to the emergency room or call 911 for a visit to the emergency room for a thorough evaluation.  Patient verbalized agreement with the suggestion and expressed gratitude for the call.

## 2025-01-05 DIAGNOSIS — J9601 Acute respiratory failure with hypoxia: Secondary | ICD-10-CM | POA: Diagnosis not present

## 2025-01-05 LAB — RESPIRATORY PANEL BY PCR

## 2025-01-05 LAB — BASIC METABOLIC PANEL WITH GFR
Anion gap: 9 (ref 5–15)
BUN: 23 mg/dL (ref 8–23)
CO2: 34 mmol/L — ABNORMAL HIGH (ref 22–32)
Calcium: 8.1 mg/dL — ABNORMAL LOW (ref 8.9–10.3)
Chloride: 99 mmol/L (ref 98–111)
Creatinine, Ser: 1.27 mg/dL — ABNORMAL HIGH (ref 0.61–1.24)
GFR, Estimated: 59 mL/min — ABNORMAL LOW
Glucose, Bld: 127 mg/dL — ABNORMAL HIGH (ref 70–99)
Potassium: 3.8 mmol/L (ref 3.5–5.1)
Sodium: 141 mmol/L (ref 135–145)

## 2025-01-05 LAB — CBC
HCT: 34.3 % — ABNORMAL LOW (ref 39.0–52.0)
Hemoglobin: 10.2 g/dL — ABNORMAL LOW (ref 13.0–17.0)
MCH: 23.7 pg — ABNORMAL LOW (ref 26.0–34.0)
MCHC: 29.7 g/dL — ABNORMAL LOW (ref 30.0–36.0)
MCV: 79.8 fL — ABNORMAL LOW (ref 80.0–100.0)
Platelets: 94 K/uL — ABNORMAL LOW (ref 150–400)
RBC: 4.3 MIL/uL (ref 4.22–5.81)
RDW: 15.9 % — ABNORMAL HIGH (ref 11.5–15.5)
WBC: 6.7 K/uL (ref 4.0–10.5)
nRBC: 0 % (ref 0.0–0.2)

## 2025-01-05 LAB — CBG MONITORING, ED
Glucose-Capillary: 135 mg/dL — ABNORMAL HIGH (ref 70–99)
Glucose-Capillary: 171 mg/dL — ABNORMAL HIGH (ref 70–99)
Glucose-Capillary: 230 mg/dL — ABNORMAL HIGH (ref 70–99)
Glucose-Capillary: 282 mg/dL — ABNORMAL HIGH (ref 70–99)

## 2025-01-05 LAB — MAGNESIUM: Magnesium: 2.2 mg/dL (ref 1.7–2.4)

## 2025-01-05 MED ORDER — ROPINIROLE HCL 1 MG PO TABS
2.0000 mg | ORAL_TABLET | Freq: Every day | ORAL | Status: DC
  Administered 2025-01-05 – 2025-01-06 (×3): 2 mg via ORAL
  Filled 2025-01-05 (×4): qty 2

## 2025-01-05 MED ORDER — FUROSEMIDE 10 MG/ML IJ SOLN
40.0000 mg | Freq: Two times a day (BID) | INTRAMUSCULAR | Status: DC
  Administered 2025-01-05 – 2025-01-06 (×3): 40 mg via INTRAVENOUS
  Filled 2025-01-05 (×3): qty 4

## 2025-01-05 NOTE — H&P (Signed)

## 2025-01-05 NOTE — ED Notes (Signed)
 Physical therapy at bedside

## 2025-01-05 NOTE — Interval H&P Note (Signed)
 History and Physical Interval Note:  01/05/2025 1:02 PM  Jerry Parsons  has presented today for surgery, with the diagnosis of Cardioversion   Afib.  The various methods of treatment have been discussed with the patient and family. After consideration of risks, benefits and other options for treatment, the patient has consented to  Procedures: CARDIOVERSION (N/A) as a surgical intervention.  The patient's history has been reviewed, patient examined, no change in status, stable for surgery.  I have reviewed the patient's chart and labs.  Questions were answered to the patient's satisfaction.     Maisha Bogen

## 2025-01-05 NOTE — Progress Notes (Signed)
 " PROGRESS NOTE    Jerry Parsons  FMW:[id number] DOB: Jun 28, 1951 DOA: 01/04/2025 PCP: Bertrum Charlie CROME, MD  Subjective: No acute events overnight. Seen and examined at bedside. Reports feeling better with resolution of chest pain and improvement in shortness of breath. Tolerating oral intake without n/v. Denies constipation.   Hospital Course:  74 y.o. male with medical history significant of A-fib and PE/DVT on Eliquis , dCHF, HTN, HLD, DM, TIA, depression, BPH, CKD-3A, , thrombocytopenia, chronic pain, right eye blindness, obesity, former smoker, who presents with chest pain and SOB.   Pt had A fib cardioversion procedure yesterday. About 4 hours after, pt began having CP and SOB.  His shortness of breath has been progressively worsening.  Patient has a dry cough, no fever or chills. The chest pain is located in the left side of chest where the conversion shock was delivered.  He has tenderness to the same location on palpation.  Patient does not have nausea, vomiting, diarrhea or abdominal pain.  No symptoms UTI.   Patient is normally not using oxygen  but was found to have moderate acute respiratory distress, oxygen  desaturation to 74 and 83% on room air, which improved to 97% on 3 L oxygen .   Assessment and Plan:  Acute respiratory failure with hypoxia (HCC)  Acute on chronic diastolic congestive heart failure (HCC):  Currently on 3L oxygen , not on oxygen  at home Patient has leg edema, interstitial pulm edema chest x-ray, crackles on auscultation, clinically consistent with CHF exacerbation.  Procalcitonin 324 which is likely falsely low due to obesity.  Chest x-ray showed right middle lobe infiltration, but patient does not have fever or leukocytosis.  Procalcitonin<0.10.  Clinically does not seem to have pneumonia.  Patient received 1 dose of Rocephin  and doxycycline  in ED, will not continue antibiotics.  2D echo on 02/02/2024 showed EF of 60-65%.  -cont Lasix  40 mg bid by IV -Daily  weights -strict I/O's -Low salt diet -Fluid restriction -As needed bronchodilators for shortness of breath - incentive spirometry - OOB to chair during daytime - encourage ambulation - monitor on tele   Chest pain Troponin elevation:  Transient in nature The chest pain is located in the left side of chest where the conversion shock was delivered.  He has tenderness to the same location on palpation, most likely due to musculoskeletal pain.   Troponin is minimally elevated at 23 --> 20, likely in the setting of CV procedure  - As needed oxycodone , Tylenol  for pain - Continue home aspirin    HTN (hypertension) -IV hydralazine  as needed - Patient is on IV Lasix    Hyperlipidemia: Patient is not taking Lipitor - Follow-up with PCP   Paroxysmal atrial fibrillation [facility]): currently heart rate 60-80s. 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  Platelet 119. -Follow-up with CBC   Type II diabetes mellitus with renal manifestations [facility]): Recent A1c 8.8, poorly controlled.  Patient is taking NovoLog , Januvia , glargine insulin  48 units twice daily -SSI - Glargine insulin  38 units twice daily   Chronic kidney disease, stage 3a [facility]): Renal function stable.  Recent creatinine 1.74 on 05/03/2024.  His creatinine is 1.29, BUN 24, GFR 58. - Follow-up with BMP   OSA - on CPAP   Depression: -Continue home medications   Obesity (BMI 30-39.9):  Patient has Obesity Class II, with body weight 119.7 Kg and BMI 36.82 kg/m2.  - Encourage losing weight - Exercise and healthy diet  DVT prophylaxis:  apixaban  (ELIQUIS ) tablet 5 mg  Eliquis    Code Status: Full Code  Disposition Plan: Home Reason for continuing need for hospitalization: IV diuresis, PT/Occupational  Therapy eval pending  Objective: Vitals:   01/05/25 1130 01/05/25 1143 01/05/25 1230 01/05/25 1300  BP: 117/70  113/63 130/73  Pulse: 73  92 (!) 101  Resp: (!) 27  14 19   Temp:  98.8 F (37.1 C)    TempSrc:  Oral    SpO2: 94%  96% 95%    Intake/Output Summary (Last 24 hours) at 01/05/2025 1322 Last data filed at 01/05/2025 1021 Gross per 24 hour  Intake --  Output 1300 ml  Net -1300 ml   There were no vitals filed for this visit.  Examination:  Physical Exam Vitals and nursing note reviewed.  Constitutional:      General: He is not in acute distress.    Appearance: He is ill-appearing.  HENT:     Head: Normocephalic and atraumatic.  Cardiovascular:     Rate and Rhythm: Normal rate and regular rhythm.     Pulses: Normal pulses.     Heart sounds: Normal heart sounds.  Pulmonary:     Effort: Pulmonary effort is normal. No respiratory distress.     Breath sounds: Normal breath sounds. No wheezing.  Abdominal:     General: Bowel sounds are normal.     Palpations: Abdomen is soft.  Musculoskeletal:     Right lower leg: Edema (chronic) present.     Left lower leg: Edema (chronic) present.  Neurological:     Mental Status: He is alert.     Data Reviewed: I have personally reviewed following labs and imaging studies  CBC: Recent Labs  Lab 01/04/25 1632 01/05/25 0435  WBC 8.7 6.7  HGB 11.3* 10.2*  HCT 37.2* 34.3*  MCV 79.1* 79.8*  PLT 119* 94*   Basic Metabolic Panel: Recent Labs  Lab 01/04/25 1632 01/05/25 0435  NA 138 141  K 4.5 3.8  CL 99 99  CO2 31 34*  GLUCOSE 163* 127*  BUN 24* 23  CREATININE 1.29* 1.27*  CALCIUM  8.7* 8.1*  MG  --  2.2   GFR: Estimated Creatinine Clearance: 67.2 mL/min (A) (by C-G formula based on SCr of 1.27 mg/dL (H)). Liver Function Tests: No results for input(s): AST, ALT, ALKPHOS, BILITOT, PROT, ALBUMIN in the last 168 hours. No results for input(s): LIPASE, AMYLASE in the last 168 hours. No results for  input(s): AMMONIA in the last 168 hours. Coagulation Profile: Recent Labs  Lab 01/04/25 1632  INR 1.5*   Cardiac Enzymes: No results for input(s): CKTOTAL, CKMB, CKMBINDEX, TROPONINI in the last 168 hours. ProBNP, BNP (last 5 results) Recent Labs    04/12/24 1559 01/04/25 1632  PROBNP  --  324.0*  BNP 32.1  --    HbA1C: No results for input(s): HGBA1C in the last 72 hours. CBG: Recent Labs  Lab 01/03/25 0714 01/04/25 2222 01/05/25 0818 01/05/25 1142  GLUCAP 137* 102* 135* 171*   Lipid Profile: No results for input(s): CHOL, HDL, LDLCALC, TRIG, CHOLHDL, LDLDIRECT in the last 72 hours. Thyroid  Function Tests: No results for input(s): TSH, T4TOTAL, FREET4, T3FREE, THYROIDAB in the last 72 hours. Anemia Panel: No results for input(s): VITAMINB12, FOLATE, FERRITIN, TIBC, IRON, RETICCTPCT in the last 72 hours. Sepsis Labs: Recent Labs  Lab 01/04/25 1850  PROCALCITON <0.10    Recent Results (from the past 240 hours)  Resp panel by RT-PCR (RSV, Flu A&B, Covid) Anterior Nasal Swab     Status: None   Collection Time: 01/04/25  5:29 PM   Specimen: Anterior Nasal Swab  Result Value Ref Range Status   SARS Coronavirus 2 by RT PCR NEGATIVE NEGATIVE Final    Comment: (NOTE) SARS-CoV-2 target nucleic acids are NOT DETECTED.  The SARS-CoV-2 RNA is generally detectable in upper respiratory specimens during the acute phase of infection. The lowest concentration of SARS-CoV-2 viral copies this assay can detect is 138 copies/mL. A negative result does not preclude SARS-Cov-2 infection and should not be used as the sole basis for treatment or other patient management decisions. A negative result may occur with  improper specimen collection/handling, submission of specimen other than nasopharyngeal swab, presence of viral mutation(s) within the areas targeted by this assay, and inadequate number of viral copies(<138 copies/mL). A  negative result must be combined with clinical observations, patient history, and epidemiological information. The expected result is Negative.  Fact Sheet for Patients:  bloggercourse.com  Fact Sheet for Healthcare Providers:  seriousbroker.it  This test is no t yet approved or cleared by the [place]  FDA and  has been authorized for detection and/or diagnosis of SARS-CoV-2 by FDA under an Emergency Use Authorization (EUA). This EUA will remain  in effect (meaning this test can be used) for the duration of the COVID-19 declaration under Section 564(b)(1) of the Act, 21 U.S.C.section 360bbb-3(b)(1), unless the authorization is terminated  or revoked sooner.       Influenza A by PCR NEGATIVE NEGATIVE Final   Influenza B by PCR NEGATIVE NEGATIVE Final    Comment: (NOTE) The Xpert Xpress SARS-CoV-2/FLU/RSV plus assay is intended as an aid in the diagnosis of influenza from Nasopharyngeal swab specimens and should not be used as a sole basis for treatment. Nasal washings and aspirates are unacceptable for Xpert Xpress SARS-CoV-2/FLU/RSV testing.  Fact Sheet for Patients: bloggercourse.com  Fact Sheet for Healthcare Providers: seriousbroker.it  This test is not yet approved or cleared by the [place]  FDA and has been authorized for detection and/or diagnosis of SARS-CoV-2 by FDA under an Emergency Use Authorization (EUA). This EUA will remain in effect (meaning this test can be used) for the duration of the COVID-19 declaration under Section 564(b)(1) of the Act, 21 U.S.C. section 360bbb-3(b)(1), unless the authorization is terminated or revoked.     Resp Syncytial Virus by PCR NEGATIVE NEGATIVE Final    Comment: (NOTE) Fact Sheet for Patients: bloggercourse.com  Fact Sheet for Healthcare  Providers: seriousbroker.it  This test is not yet approved or cleared by the [place]  FDA and has been authorized for detection and/or diagnosis of SARS-CoV-2 by FDA under an Emergency Use Authorization (EUA). This EUA will remain in effect (meaning this test can be used) for the duration of the COVID-19 declaration under Section 564(b)(1) of the Act, 21 U.S.C. section 360bbb-3(b)(1), unless the authorization is terminated or revoked.  Performed at [facility], [address] [place], [place] [address]   Culture, blood (Routine X 2) w Reflex to ID Panel     Status: None (Preliminary result)   Collection Time: 01/04/25  6:50 PM   Specimen: BLOOD  Result Value Ref Range Status   Specimen Description BLOOD BLOOD RIGHT FOREARM  Final   Special Requests   Final    BOTTLES DRAWN AEROBIC AND ANAEROBIC Blood Culture  adequate volume   Culture   Final    NO GROWTH < 24 HOURS Performed at [facility], [address] [place], [place] [address]    Report Status PENDING  Incomplete  Culture, blood (Routine X 2) w Reflex to ID Panel     Status: None (Preliminary result)   Collection Time: 01/04/25  6:50 PM   Specimen: BLOOD  Result Value Ref Range Status   Specimen Description BLOOD BLOOD LEFT FOREARM  Final   Special Requests   Final    BOTTLES DRAWN AEROBIC AND ANAEROBIC Blood Culture adequate volume   Culture   Final    NO GROWTH < 24 HOURS Performed at [facility], [address] [place], [place] [address]    Report Status PENDING  Incomplete  Respiratory (~20 pathogens) panel by PCR     Status: None   Collection Time: 01/04/25  6:59 PM   Specimen: Nasopharyngeal Swab; Respiratory  Result Value Ref Range Status   Adenovirus NOT DETECTED NOT DETECTED Final   Coronavirus 229E NOT DETECTED NOT DETECTED Final    Comment: (NOTE) The Coronavirus on the Respiratory Panel, DOES NOT test for the novel  Coronavirus (2019  nCoV)    Coronavirus HKU1 NOT DETECTED NOT DETECTED Final   Coronavirus NL63 NOT DETECTED NOT DETECTED Final   Coronavirus OC43 NOT DETECTED NOT DETECTED Final   Metapneumovirus NOT DETECTED NOT DETECTED Final   Rhinovirus / Enterovirus NOT DETECTED NOT DETECTED Final   Influenza A NOT DETECTED NOT DETECTED Final   Influenza B NOT DETECTED NOT DETECTED Final   Parainfluenza Virus 1 NOT DETECTED NOT DETECTED Final   Parainfluenza Virus 2 NOT DETECTED NOT DETECTED Final   Parainfluenza Virus 3 NOT DETECTED NOT DETECTED Final   Parainfluenza Virus 4 NOT DETECTED NOT DETECTED Final   Respiratory Syncytial Virus NOT DETECTED NOT DETECTED Final   Bordetella pertussis NOT DETECTED NOT DETECTED Final   Bordetella Parapertussis NOT DETECTED NOT DETECTED Final   Chlamydophila pneumoniae NOT DETECTED NOT DETECTED Final   Mycoplasma pneumoniae NOT DETECTED NOT DETECTED Final    Comment: Performed at [facility], [address], [place], [place] [address]     Radiology Studies: DG Chest 2 View Result Date: 01/04/2025 EXAM: 2 VIEW(S) XRAY OF THE CHEST 01/04/2025 04:50:00 PM COMPARISON: 04/27/2024 CLINICAL HISTORY: sob FINDINGS: LUNGS AND PLEURA: Small right pleural effusion. Diffuse interstitial prominence. Focal airspace opacity in right mid lung. No pneumothorax. HEART AND MEDIASTINUM: No acute abnormality of the cardiac and mediastinal silhouettes. BONES AND SOFT TISSUES: Partially visualized lumbar spine fusion hardware. Multilevel degenerative changes of thoracic spine. IMPRESSION: 1. Right midlung pneumonia. 2. Small right pleural effusion. 3. Interstitial pulmonary edema. Electronically signed by: Greig Pique MD MD 01/04/2025 05:19 PM EST RP Workstation: [id number]    Scheduled Meds:  amiodarone   400 mg Oral BID   apixaban   5 mg Oral BID   aspirin  EC  81 mg Oral QPM   dutasteride   0.5 mg Oral Daily   furosemide   40 mg Intravenous Q12H   gabapentin   300 mg Oral QHS   insulin  aspart   0-5 Units Subcutaneous QHS   insulin  aspart  0-9 Units Subcutaneous TID WC   insulin  glargine-yfgn  38 Units Subcutaneous BID   ipratropium-albuterol   3 mL Nebulization Q4H   magnesium  oxide  400 mg Oral BID   nortriptyline   30 mg Oral QHS   oxyCODONE   40 mg Oral Q8H   pregabalin   200 mg  Oral TID   rOPINIRole   2 mg Oral QHS   tamsulosin   0.4 mg Oral Daily   Continuous Infusions:   LOS: 1 day   Norval Bar, MD  [facility]  01/05/2025, 1:22 PM   "

## 2025-01-05 NOTE — Evaluation (Signed)
 Occupational Therapy Evaluation Patient Details Name: Jerry Parsons MRN: [id number] DOB: January 03, 1951 Today's Date: 01/05/2025   History of Present Illness   74 y.o. male with medical history significant of A-fib and PE/DVT on Eliquis , dCHF, HTN, HLD, DM, TIA, depression, BPH, CKD-3A, , thrombocytopenia, chronic pain, right eye blindness, obesity, former smoker, who presents with chest pain and SOB.     Clinical Impressions Pt was seen for OT evaluation this date. Prior to hospital admission, pt was grossly indep with ADL and mobility, using SPC PRN. Reports his spouse assists with medications. Pt lives in a single story home with 1 STE. Pt presents with deficits in cardiopulmonary status (unable to maintain O2 >90% on RA with activity, requiring 2L), activity tolerance, and balance limiting their ability to perform ADL management at baseline level. Pt currently requires PRN MIN A for LB ADL and SBA-CGA for ADL mobility without AD. May benefit from increased [facility] use.  Pt would benefit from skilled OT services to address noted impairments and functional limitations (see below for any additional details) in order to maximize safety and independence while minimizing future risk of falls, injury, and readmission. Do not anticipate the need for follow up OT services upon acute hospital DC.    If plan is discharge home, recommend the following:   A little help with bathing/dressing/bathroom;Assistance with cooking/housework;Assist for transportation;Help with stairs or ramp for entrance     Functional Status Assessment   Patient has had a recent decline in their functional status and demonstrates the ability to make significant improvements in function in a reasonable and predictable amount of time.     Equipment Recommendations   None recommended by OT     Recommendations for Other Services         Precautions/Restrictions   Precautions Precautions: Fall Recall of  Precautions/Restrictions: Intact Precaution/Restrictions Comments: watch O2 Restrictions Weight Bearing Restrictions Per Provider Order: No     Mobility Bed Mobility Overal bed mobility: Modified Independent                  Transfers Overall transfer level: Needs assistance   Transfers: Sit to/from Stand Sit to Stand: Supervision, Contact guard assist           General transfer comment: SBA      Balance Overall balance assessment: Needs assistance Sitting-balance support: No upper extremity supported, Feet supported Sitting balance-Leahy Scale: Good       Standing balance-Leahy Scale: Fair Standing balance comment: a bit unsteady                           ADL either performed or assessed with clinical judgement   ADL Overall ADL's : Needs assistance/impaired               Lower Body Bathing Details (indicate cue type and reason): PRN MIN A       Lower Body Dressing Details (indicate cue type and reason): PRN MIN A Toilet Transfer: Supervision/safety;Contact guard assist Toilet Transfer Details (indicate cue type and reason): SBA         Functional mobility during ADLs: Supervision/safety;Contact guard assist       Vision         Perception         Praxis         Pertinent Vitals/Pain Pain Assessment Pain Assessment: No/denies pain     Extremity/Trunk Assessment Upper Extremity Assessment Upper Extremity Assessment: Overall WFL for  tasks assessed   Lower Extremity Assessment Lower Extremity Assessment: Defer to PT evaluation       Communication Communication Communication: No apparent difficulties   Cognition Arousal: Alert Behavior During Therapy: WFL for tasks assessed/performed Cognition: No apparent impairments                               Following commands: Intact       Cueing  General Comments   Cueing Techniques: Verbal cues  With RN consent, SpO2 dropped from 3 to 2L and then  removed for mobility assessment in room. SpO2 dropped to 87-88% on RA with activity, improving to mid 90's with 2L O2   Exercises Other Exercises Other Exercises: Pt ambulated ~ 25' in room without AD, mildly unsteady Other Exercises: Pt edu in PLB   Shoulder Instructions      Home Living Family/patient expects to be discharged to:: Private residence Living Arrangements: Spouse/significant other Available Help at Discharge: Family Type of Home: House Home Access: Stairs to enter Secretary/administrator of Steps: 1 Entrance Stairs-Rails: None Home Layout: One level     Bathroom Shower/Tub: Producer, Television/film/video: Handicapped height     Home Equipment: Administrator (4 wheels);Rolling Walker (2 wheels);Wheelchair - manual          Prior Functioning/Environment Prior Level of Function : Independent/Modified Independent;Driving             Mobility Comments: PRN use of SPC ADLs Comments: indep; wife manages meds, share cooking/cleaning duties    OT Problem List: Cardiopulmonary status limiting activity;Decreased activity tolerance;Impaired balance (sitting and/or standing);Decreased knowledge of use of DME or AE   OT Treatment/Interventions: Self-care/ADL training;Therapeutic exercise;Therapeutic activities;Energy conservation;DME and/or AE instruction;Patient/family education;Balance training      OT Goals(Current goals can be found in the care plan section)   Acute Rehab OT Goals Patient Stated Goal: get off O2 and go home OT Goal Formulation: With patient Time For Goal Achievement: 01/19/25 Potential to Achieve Goals: Good ADL Goals Pt Will Perform Lower Body Dressing: with modified independence;sit to/from stand;sitting/lateral leans Pt Will Transfer to Toilet: with modified independence;ambulating (LRAD) Pt Will Perform Toileting - Clothing Manipulation and hygiene: with modified independence;sitting/lateral leans;sit to/from  stand Additional ADL Goal #1: Pt will verbalize plan to implement at least 1 learned ECS to maximize safety/indep with ADL/mobility.   OT Frequency:  Min 2X/week    Co-evaluation              AM-PAC OT 6 Clicks Daily Activity     Outcome Measure Help from another person eating meals?: None Help from another person taking care of personal grooming?: None Help from another person toileting, which includes using toliet, bedpan, or urinal?: A Little Help from another person bathing (including washing, rinsing, drying)?: A Little Help from another person to put on and taking off regular upper body clothing?: None Help from another person to put on and taking off regular lower body clothing?: A Little 6 Click Score: 21   End of Session Equipment Utilized During Treatment: Oxygen  Nurse Communication: Other (comment) (O2)  Activity Tolerance: Patient tolerated treatment well Patient left: in bed;with call bell/phone within reach;with nursing/sitter in room  OT Visit Diagnosis: Unsteadiness on feet (R26.81)                Time: 1035-1050 OT Time Calculation (min): 15 min Charges:  OT General Charges $OT Visit: 1 Visit OT  Evaluation $OT Eval Low Complexity: 1 Low  Warren SAUNDERS., MPH, MS, OTR/L ascom [phone] 01/05/2025, 11:14 AM

## 2025-01-05 NOTE — H&P (Incomplete)
 " History and Physical    Jerry Parsons FMW:[id number] DOB: 1951-12-01 DOA: 01/04/2025  Referring MD/NP/PA:   PCP: Bertrum Charlie CROME, MD   Patient coming from:  The patient is coming from home.     Chief Complaint: SOB and chest pain  HPI: Jerry Parsons is a 74 y.o. male with medical history significant of A-fib and PE/DVT on Eliquis , dCHF, HTN, HLD, DM, TIA, depression, BPH, CKD-3A, , thrombocytopenia, chronic pain, right eye blindness, obesity, former smoker, who presents with chest pain and SOB.  Pt had A fib cardioversion procedure yesterday. About 4 hours after, pt began having CP and SOB.  His shortness of breath has been progressively worsening.  Patient has a dry cough, no fever or chills. The chest pain is located in the left side of chest where the conversion shock was delivered.  He has tenderness to the same location on palpation.  Patient does not have nausea, vomiting, diarrhea or abdominal pain.  No symptoms UTI.  Patient is normally not using oxygen  but was found to have moderate acute respiratory distress, oxygen  desaturation to 74 and 83% on room air, which improved to 97% on 3 L oxygen .  Data reviewed independently and ED Course: pt was found to have WBC 8.7, procalcitonin <0.10, BNP 324, negative PCR for COVID, flu and RSV, stable renal function, troponin 23.  Temperature normal, blood pressure 130/49, heart rate 60-80s, RR 22.  Patient is admitted to telemetry bed as inpatient.  Chest x-ray: 1. Right midlung pneumonia. 2. Small right pleural effusion. 3. Interstitial pulmonary edema.   EKG: I have personally reviewed.  Sinus rhythm, QTc 470, early R wave progression, no ischemic change.   Review of Systems:   General: no fevers, chills, no body weight gain, has fatigue HEENT: no blurry vision, hearing changes or sore throat Respiratory: has dyspnea, coughing, no wheezing CV: has chest pain, no palpitations GI: no nausea, vomiting, abdominal pain, diarrhea,  constipation GU: no dysuria, burning on urination, increased urinary frequency, hematuria  Ext: has leg edema Neuro: no unilateral weakness, numbness, or tingling, no vision change or hearing loss Skin: no rash, no skin tear. MSK: No muscle spasm, no deformity, no limitation of range of movement in spin Heme: No easy bruising.  Travel history: No recent long distant travel.   Allergy: Allergies[1]  Past Medical History:  Diagnosis Date   Arthritis    Atrial flutter (HCC)    a. 12/2023 s/p DCCV.   Blind right eye    BPH (benign prostatic hyperplasia)    Chest pain    a. 03/2024 MV: EF 56%, mild cor Ca2+. No ischemia/infarct.   Chronic heart failure with preserved ejection fraction (HFpEF) (HCC)    a. 01/2024 Echo: EF 60-65%, no rwma, nl RV fxn, mildly dil LA, mild MR, AoV sclerosis w/o stenosis.   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 06/2024 Zio: avg HR of 69 bpm (49-105), rare  PACs and PVCs without triggered events.   Pulmonary embolus (HCC) 2011   Seasonal allergies    Seizures (HCC)    as child    Sleep apnea    BIPAP   Stiff neck    limited turning s/p titanium plate placement   TIA (transient ischemic attack)    Wears dentures    full upper and lower    Past Surgical History:  Procedure Laterality Date   BACK SURGERY     x 8; upper x 3 & lower x 5   CARDIOVERSION  03/14/13, 10/16   2014 - [facility], 2016 - [facility]   CARDIOVERSION N/A 09/11/2020   Procedure: CARDIOVERSION;  Surgeon: Perla Evalene PARAS, MD;  Location: [facility] ORS;  Service:  Cardiovascular;  Laterality: N/A;   CARDIOVERSION N/A 01/25/2024   Procedure: CARDIOVERSION;  Surgeon: Perla Evalene PARAS, MD;  Location: [facility] ORS;  Service: Cardiovascular;  Laterality: N/A;   CARDIOVERSION N/A 01/03/2025   Procedure: CARDIOVERSION;  Surgeon: Perla Evalene PARAS, MD;  Location: [facility];  Service: Cardiovascular;  Laterality: N/A;   CATARACT EXTRACTION W/PHACO Left 10/29/2015   Procedure: CATARACT EXTRACTION PHACO AND INTRAOCULAR LENS PLACEMENT (IOC);  Surgeon: Dene Etienne, MD;  Location: [facility];  Service: Ophthalmology;  Laterality: Left;  DIABETIC - insulin  and oral medsSleep apnea - no machine   CATARACT EXTRACTION W/PHACO Right 02/16/2023   Procedure: CATARACT EXTRACTION PHACO AND INTRAOCULAR LENS PLACEMENT (IOC) RIGHT DIABETIC MALYUGIN HEALON 5 VISION BLUE  65.52  03:57.7;  Surgeon: Etienne Dene, MD;  Location: [facility];  Service: Ophthalmology;  Laterality: Right;  Diabetic   CHOLECYSTECTOMY     COLONOSCOPY WITH PROPOFOL  N/A 01/16/2018   Procedure: COLONOSCOPY WITH PROPOFOL ;  Surgeon: Viktoria Lamar DASEN, MD;  Location: [facility] END[facility];  Service: Endoscopy;  Laterality: N/A;   ESOPHAGOGASTRODUODENOSCOPY (EGD) WITH PROPOFOL  N/A 01/16/2018   Procedure: ESOPHAGOGASTRODUODENOSCOPY (EGD) WITH PROPOFOL ;  Surgeon: Viktoria Lamar DASEN, MD;  Location: [facility] END[facility];  Service: Endoscopy;  Laterality: N/A;   EYE SURGERY     GALLBLADDER SURGERY  2002   JOINT REPLACEMENT Right 2018   KNEE ARTHROSCOPY     left    PARATHYROIDECTOMY N/A 07/25/2020   Procedure: PARATHYROIDECTOMY;  Surgeon: Marolyn Nest, MD;  Location: [facility] ORS;  Service: General;  Laterality: N/A;   ROTATOR CUFF REPAIR  2001   left    TEE WITHOUT CARDIOVERSION N/A 04/26/2018   Procedure: TRANSESOPHAGEAL ECHOCARDIOGRAM (TEE);  Surgeon: Mady Bruckner, MD;  Location: [facility] OR[facility];  Service: Cardiovascular;  Laterality: N/A;   TONSILLECTOMY     TOTAL KNEE ARTHROPLASTY Right  06/20/2017   Procedure: RIGHT TOTAL KNEE ARTHROPLASTY;  Surgeon: Melodi Lerner, MD;  Location: [facility];  Service: Orthopedics;  Laterality: Right;    Social History:  reports that he quit smoking about 35 years ago. His smoking use included cigarettes. He started smoking about 55 years ago. He has a 40 pack-year smoking history. He has never used smokeless tobacco. He reports that he does not drink alcohol and does not use drugs.  Family History:  Family History  Problem Relation Age of Onset   Heart attack Brother      Prior to Admission medications  Medication Sig Start Date End Date Taking? Authorizing Provider  amiodarone  (PACERONE ) 200 MG tablet 400 mg (2 tabs) twice daily for 1 week, 200 mg (1 tab) twice daily for 1 week, then 200 mg once daily as directed by Dr Perla Patient taking differently: Take 400 mg by mouth in the morning  and at bedtime. 12/25/24   Gollan, Timothy J, MD  ASPIRIN  ADULT LOW DOSE 81 MG tablet Take 81 mg by mouth every evening.    [provider]  atorvastatin  (LIPITOR) 40 MG tablet Take 1 tablet (40 mg total) by mouth daily. Patient not taking: Reported on 12/31/2024 04/15/24   Darci Pore, MD  docusate sodium  (COLACE) 100 MG capsule Take 100 mg by mouth See admin instructions. Twice daily on Mon, Wed, and Fri    [provider]  dutasteride  (AVODART ) 0.5 MG capsule Take 0.5 mg by mouth daily. 07/06/23   [provider]  ELIQUIS  5 MG TABS tablet TAKE 1 TABLET TWICE A DAY 11/16/24   Gollan, Timothy J, MD  FARXIGA  10 MG TABS tablet TAKE 1 TABLET BY MOUTH EVERY DAY 10/04/22   Ostwalt, Janna, PA-C  fexofenadine (ALLEGRA) 180 MG tablet Take 180 mg by mouth daily.    [provider]  gabapentin  (NEURONTIN ) 100 MG capsule Take 100 mg by mouth 2 (two) times daily. 100 mg in the morning, 100 mg 3p    [provider]  gabapentin  (NEURONTIN ) 300 MG capsule Take 1 capsule (300 mg total) by mouth at bedtime. 11/12/22    Gasper Nancyann BRAVO, MD  Insulin  Glargine (BASAGLAR  KWIKPEN) 100 UNIT/ML INJECT 40 UNITS            SUBCUTANEOUSLY TWO TIMES A DAY Patient taking differently: Inject 48 Units into the skin 2 (two) times daily. 05/31/23   Simmons-Robinson, Rockie, MD  magnesium  oxide (MAG-OX) 400 (240 Mg) MG tablet Take 1 tablet by mouth 2 (two) times daily. 02/29/24   [provider]  metoprolol  tartrate (LOPRESSOR ) 50 MG tablet Take 50 mg by mouth 2 (two) times daily. 10/27/15   [provider]  mupirocin  ointment (BACTROBAN ) 2 % Apply 1 Application topically 2 (two) times daily. 11/26/24   [provider]  naloxone  (NARCAN ) nasal spray 4 mg/0.1 mL Please use in the event of overdose from narcotic medication 12/29/22   Simmons-Robinson, Rockie, MD  nortriptyline  (PAMELOR ) 10 MG capsule Take 30 mg by mouth at bedtime. 03/13/24   [provider]  NOVOLOG  FLEXPEN 100 UNIT/ML FlexPen INJECT 14 UNITS            SUBCUTANEOUSLY IN THE      MORNING AND AT BEDTIME 04/26/22   Bertrum Charlie CROME, MD  Omega-3 Fatty Acids (FISH OIL) 1200 MG CAPS Take 1,200 mg by mouth 2 (two) times daily.    [provider]  [facility] ULTRA test strip USE WITH METER TWICE DAILY 09/13/22   Bertrum Charlie CROME, MD  oxyCODONE  (OXYCONTIN ) 40 mg 12 hr tablet Take 1 tablet (40 mg total) by mouth every 8 (eight) hours as needed. Patient taking differently: Take 40 mg by mouth every 8 (eight) hours. 12/09/22   Simmons-Robinson, Makiera, MD  polyethylene glycol (MIRALAX  / GLYCOLAX ) 17 g packet Take 17 g by mouth daily as needed for mild constipation or moderate constipation.    [provider]  pregabalin  (LYRICA ) 200 MG capsule TAKE 1 CAPSULE 3 TIMES A   DAY 09/10/22   Bertrum Charlie CROME, MD  rOPINIRole  (REQUIP ) 1 MG tablet TAKE 1 TO 2 TABLETS BY MOUTH AT BEDTIME AS NEEDED Patient taking differently: Take 1 mg by mouth 2 (two) times daily. 1 mg in the morning, 1 mg at 3p 07/21/22   Bertrum Charlie CROME, MD   rOPINIRole  (REQUIP ) 2 MG tablet TAKE 1 TABLET BY MOUTH TWICE A DAY AS NEEDED  Patient taking differently: Take 2 mg by mouth at bedtime. 09/07/22   Bertrum Charlie CROME, MD  sitaGLIPtin  (JANUVIA ) 100 MG tablet Take 100 mg by mouth daily. 12/26/15   [provider]  tamsulosin  (FLOMAX ) 0.4 MG CAPS capsule Take 0.4 mg by mouth daily. 10/12/20   [provider]  torsemide  (DEMADEX ) 20 MG tablet Take 1 tablet (20 mg total) by mouth every other day. Take one tablet as needed for a weight gain of 2 lbs in 24 hrs or 5 lbs in a week. 04/14/24   Darci Pore, MD    Physical Exam: Vitals:   01/04/25 1821 01/04/25 2200 01/04/25 2205 01/05/25 0000  BP: (!) 130/49 (!) 129/50  130/63  Pulse: 65 70  64  Resp: 15 12  12   Temp: 98.8 F (37.1 C)  98.6 F (37 C) 98.5 F (36.9 C)  TempSrc: Oral  Oral Oral  SpO2: 97% 93%  93%   General: Not in acute distress HEENT:       Eyes: PERRL, EOMI, no jaundice       ENT: No discharge from the ears and nose, no pharynx injection, no tonsillar enlargement.        Neck: No JVD, no bruit, no mass felt. Heme: No neck lymph node enlargement. Cardiac: S1/S2, RRR, No murmurs, No gallops or rubs. Respiratory: has fine crackles bilaterally. GI: Soft, nondistended, nontender, no rebound pain, no organomegaly, BS present. GU: No hematuria Ext: Has chronic venous insufficiency in both legs.  Has 2+ pitting leg edema bilaterally. Musculoskeletal: No joint deformities, No joint redness or warmth, no limitation of ROM in spin.  Has tenderness to the left side of chest wall Skin: No rashes.  Neuro: Alert, oriented X3, cranial nerves II-XII grossly intact, moves all extremities normally.  Psych: Patient is not psychotic, no suicidal or hemocidal ideation.  Labs on Admission: I have personally reviewed following labs and imaging studies  CBC: Recent Labs  Lab 01/04/25 1632  WBC 8.7  HGB 11.3*  HCT 37.2*  MCV 79.1*  PLT 119*   Basic Metabolic  Panel: Recent Labs  Lab 01/04/25 1632  NA 138  K 4.5  CL 99  CO2 31  GLUCOSE 163*  BUN 24*  CREATININE 1.29*  CALCIUM  8.7*   GFR: Estimated Creatinine Clearance: 66.2 mL/min (A) (by C-G formula based on SCr of 1.29 mg/dL (H)). Liver Function Tests: No results for input(s): AST, ALT, ALKPHOS, BILITOT, PROT, ALBUMIN in the last 168 hours. No results for input(s): LIPASE, AMYLASE in the last 168 hours. No results for input(s): AMMONIA in the last 168 hours. Coagulation Profile: Recent Labs  Lab 01/04/25 1632  INR 1.5*   Cardiac Enzymes: No results for input(s): CKTOTAL, CKMB, CKMBINDEX, TROPONINI in the last 168 hours. BNP (last 3 results) Recent Labs    01/04/25 1632  PROBNP 324.0*   HbA1C: No results for input(s): HGBA1C in the last 72 hours. CBG: Recent Labs  Lab 01/03/25 0714 01/04/25 2222  GLUCAP 137* 102*   Lipid Profile: No results for input(s): CHOL, HDL, LDLCALC, TRIG, CHOLHDL, LDLDIRECT in the last 72 hours. Thyroid  Function Tests: No results for input(s): TSH, T4TOTAL, FREET4, T3FREE, THYROIDAB in the last 72 hours. Anemia Panel: No results for input(s): VITAMINB12, FOLATE, FERRITIN, TIBC, IRON, RETICCTPCT in the last 72 hours. Urine analysis:    Component Value Date/Time   COLORURINE YELLOW (A) 04/22/2018 0255   APPEARANCEUR Clear 09/28/2023 0924   LABSPEC 1.016 04/22/2018 0255   LABSPEC 1.015 04/12/2013  1256   PHURINE 5.0 04/22/2018 0255   GLUCOSEU Trace (A) 09/28/2023 0924   GLUCOSEU >=500 04/12/2013 1256   HGBUR NEGATIVE 04/22/2018 0255   BILIRUBINUR Negative 09/28/2023 0924   BILIRUBINUR Negative 04/12/2013 1256   KETONESUR 5 (A) 04/22/2018 0255   PROTEINUR 1+ (A) 09/28/2023 0924   PROTEINUR NEGATIVE 04/22/2018 0255   UROBILINOGEN 0.2 09/18/2020 1310   UROBILINOGEN 0.2 02/05/2010 1041   NITRITE Negative 09/28/2023 0924   NITRITE NEGATIVE 04/22/2018 0255   LEUKOCYTESUR  Negative 09/28/2023 0924   LEUKOCYTESUR Negative 04/12/2013 1256   Sepsis Labs: @LABRCNTIP (procalcitonin:4,lacticidven:4) ) Recent Results (from the past 240 hours)  Resp panel by RT-PCR (RSV, Flu A&B, Covid) Anterior Nasal Swab     Status: None   Collection Time: 01/04/25  5:29 PM   Specimen: Anterior Nasal Swab  Result Value Ref Range Status   SARS Coronavirus 2 by RT PCR NEGATIVE NEGATIVE Final    Comment: (NOTE) SARS-CoV-2 target nucleic acids are NOT DETECTED.  The SARS-CoV-2 RNA is generally detectable in upper respiratory specimens during the acute phase of infection. The lowest concentration of SARS-CoV-2 viral copies this assay can detect is 138 copies/mL. A negative result does not preclude SARS-Cov-2 infection and should not be used as the sole basis for treatment or other patient management decisions. A negative result may occur with  improper specimen collection/handling, submission of specimen other than nasopharyngeal swab, presence of viral mutation(s) within the areas targeted by this assay, and inadequate number of viral copies(<138 copies/mL). A negative result must be combined with clinical observations, patient history, and epidemiological information. The expected result is Negative.  Fact Sheet for Patients:  bloggercourse.com  Fact Sheet for Healthcare Providers:  seriousbroker.it  This test is no t yet approved or cleared by the [place]  FDA and  has been authorized for detection and/or diagnosis of SARS-CoV-2 by FDA under an Emergency Use Authorization (EUA). This EUA will remain  in effect (meaning this test can be used) for the duration of the COVID-19 declaration under Section 564(b)(1) of the Act, 21 U.S.C.section 360bbb-3(b)(1), unless the authorization is terminated  or revoked sooner.       Influenza A by PCR NEGATIVE NEGATIVE Final   Influenza B by PCR NEGATIVE NEGATIVE Final     Comment: (NOTE) The Xpert Xpress SARS-CoV-2/FLU/RSV plus assay is intended as an aid in the diagnosis of influenza from Nasopharyngeal swab specimens and should not be used as a sole basis for treatment. Nasal washings and aspirates are unacceptable for Xpert Xpress SARS-CoV-2/FLU/RSV testing.  Fact Sheet for Patients: bloggercourse.com  Fact Sheet for Healthcare Providers: seriousbroker.it  This test is not yet approved or cleared by the [place]  FDA and has been authorized for detection and/or diagnosis of SARS-CoV-2 by FDA under an Emergency Use Authorization (EUA). This EUA will remain in effect (meaning this test can be used) for the duration of the COVID-19 declaration under Section 564(b)(1) of the Act, 21 U.S.C. section 360bbb-3(b)(1), unless the authorization is terminated or revoked.     Resp Syncytial Virus by PCR NEGATIVE NEGATIVE Final    Comment: (NOTE) Fact Sheet for Patients: bloggercourse.com  Fact Sheet for Healthcare Providers: seriousbroker.it  This test is not yet approved or cleared by the [place]  FDA and has been authorized for detection and/or diagnosis of SARS-CoV-2 by FDA under an Emergency Use Authorization (EUA). This EUA will remain in effect (meaning this test can be used) for the duration of the COVID-19 declaration under Section 564(b)(1) of  the Act, 21 U.S.C. section 360bbb-3(b)(1), unless the authorization is terminated or revoked.  Performed at [facility], [address] [place], [place] [address]      Radiological Exams on Admission:   Assessment/Plan Principal Problem:   Acute respiratory failure with hypoxia [facility]) Active Problems:   Acute on chronic diastolic congestive heart failure (HCC)   Chest pain   HTN (hypertension)   Hyperlipidemia   Paroxysmal atrial fibrillation (HCC)   DVT (deep venous  thrombosis) (HCC)   Hx of pulmonary embolus   TIA (transient ischemic attack)   Thrombocytopenia   Type II diabetes mellitus with renal manifestations (HCC)   Chronic kidney disease, stage 3a (HCC)   OSA on CPAP   Depression   Obesity (BMI 30-39.9)   Assessment and Plan: No notes have been filed under this hospital service. Service: Hospitalist      Principal Problem:   Acute respiratory failure with hypoxia (HCC) Active Problems:   Acute on chronic diastolic congestive heart failure (HCC)   Chest pain   HTN (hypertension)   Hyperlipidemia   Paroxysmal atrial fibrillation (HCC)   DVT (deep venous thrombosis) (HCC)   Hx of pulmonary embolus   TIA (transient ischemic attack)   Thrombocytopenia   Type II diabetes mellitus with renal manifestations (HCC)   Chronic kidney disease, stage 3a (HCC)   OSA on CPAP   Depression   Obesity (BMI 30-39.9)    DVT ppx: on Eliquis   ***  Code Status: Full code   ***  Family Communication:     not done, no family member is at bed side.              Yes, patient's    at bed side.       by phone   ***  Disposition Plan:  Anticipate discharge back to previous environment  Consults called:    Admission status and Level of care: Telemetry:    for obs as inpt        Dispo: The patient is from: {From:23814}              Anticipated d/c is to: {To:23815}              Anticipated d/c date is: {Days:23816}              Patient currently {Medically stable:23817}    Severity of Illness:  {Observation/Inpatient:21159}       Date of Service 01/05/2025    Caleb Exon [facility]   If 7PM-7AM, please contact night-coverage [email or web address] 01/05/2025, 12:04 AM       [1] Allergies Allergen Reactions   Carisoprodol Itching and Nausea And Vomiting    Reaction:  Unknown   Sensitivity   Reaction:  Unknown     Reaction:  Unknown     Sensitivity     Reaction:  Unknown  "

## 2025-01-05 NOTE — ED Notes (Signed)
 PT with pt.

## 2025-01-05 NOTE — Evaluation (Signed)
 Physical Therapy Evaluation Patient Details Name: Jerry Parsons MRN: [id number] DOB: 10-Jun-1951 Today's Date: 01/05/2025  History of Present Illness  74 y.o. male with medical history significant of A-fib and PE/DVT on Eliquis , dCHF, HTN, HLD, DM, TIA, depression, BPH, CKD-3A, , thrombocytopenia, chronic pain, right eye blindness, obesity, former smoker, who presents with chest pain and SOB.  Clinical Impression  Patient noted to be in supine position at PT arrival in room, for an initial PT evaluation due to a decline in functional status, with baseline mobility reported as modI, and currently requiring min.CGA for short gait in room 25' without RW. The patient is A&O x 4, presenting with good willingness to work with PT. The patient resides in a house and lives with spouse with family/friend support. There is 1 STE inside the residence.  Vitals are unstable with an SpO? of 90% on 2 L/min. At rest and destat to 88% with short gait; pt recovers with short rest. Gait was assessed with 1+ hand held assist. Gait mechanic observations noted mild unsteadiness noted. The overall clinical impression is that the patient presents with mild mobility limitations. Recommended skilled PT will address safety, mobility, and discharge planning.         If plan is discharge home, recommend the following: A little help with walking and/or transfers;Help with stairs or ramp for entrance;Assist for transportation   Can travel by private vehicle        Equipment Recommendations None recommended by PT  Recommendations for Other Services       Functional Status Assessment Patient has had a recent decline in their functional status and demonstrates the ability to make significant improvements in function in a reasonable and predictable amount of time.     Precautions / Restrictions Precautions Precautions: Fall Recall of Precautions/Restrictions: Intact Precaution/Restrictions Comments: watch  O2 Restrictions Weight Bearing Restrictions Per Provider Order: No      Mobility  Bed Mobility Overal bed mobility: Modified Independent                  Transfers Overall transfer level: Needs assistance   Transfers: Sit to/from Stand Sit to Stand: Supervision, Contact guard assist                Ambulation/Gait Ambulation/Gait assistance: Contact guard assist, Min assist Gait Distance (Feet): 30 Feet Assistive device: 1 person hand held assist         General Gait Details: mildly unsteady with short gait back and forward in room  Stairs            Wheelchair Mobility     Tilt Bed    Modified Rankin (Stroke Patients Only)       Balance Overall balance assessment: Needs assistance Sitting-balance support: No upper extremity supported, Feet supported Sitting balance-Leahy Scale: Good       Standing balance-Leahy Scale: Fair Standing balance comment: a bit unsteady                             Pertinent Vitals/Pain Pain Assessment Pain Assessment: No/denies pain    Home Living Family/patient expects to be discharged to:: Private residence Living Arrangements: Spouse/significant other Available Help at Discharge: Family Type of Home: House Home Access: Stairs to enter Entrance Stairs-Rails: None Entrance Stairs-Number of Steps: 1   Home Layout: One level Home Equipment: Administrator (4 wheels);Rolling Walker (2 wheels);Wheelchair - manual      Prior Function Prior Level  of Function : Independent/Modified Independent;Driving             Mobility Comments: PRN use of SPC ADLs Comments: indep; wife manages meds, share cooking/cleaning duties     Extremity/Trunk Assessment   Upper Extremity Assessment Upper Extremity Assessment: Overall WFL for tasks assessed    Lower Extremity Assessment Lower Extremity Assessment: Generalized weakness    Cervical / Trunk Assessment Cervical / Trunk  Assessment: Normal  Communication   Communication Communication: No apparent difficulties    Cognition Arousal: Alert Behavior During Therapy: WFL for tasks assessed/performed                             Following commands: Intact       Cueing Cueing Techniques: Verbal cues     General Comments General comments (skin integrity, edema, etc.): With RN consent, SpO2 dropped from 3 to 2L and then removed for mobility assessment in room. SpO2 dropped to 87-88% on RA with activity, improving to mid 90's with 2L O2    Exercises     Assessment/Plan    PT Assessment Patient needs continued PT services  PT Problem List Decreased strength;Decreased mobility;Decreased activity tolerance       PT Treatment Interventions Gait training;Stair training;Functional mobility training;Therapeutic activities;Therapeutic exercise    PT Goals (Current goals can be found in the Care Plan section)  Acute Rehab PT Goals Patient Stated Goal: Pt wants to go home PT Goal Formulation: With patient Time For Goal Achievement: 01/26/25 Potential to Achieve Goals: Good    Frequency Min 2X/week     Co-evaluation               AM-PAC PT 6 Clicks Mobility  Outcome Measure Help needed turning from your back to your side while in a flat bed without using bedrails?: None Help needed moving from lying on your back to sitting on the side of a flat bed without using bedrails?: None Help needed moving to and from a bed to a chair (including a wheelchair)?: A Little Help needed standing up from a chair using your arms (e.g., wheelchair or bedside chair)?: A Little Help needed to walk in hospital room?: A Little Help needed climbing 3-5 steps with a railing? : A Little 6 Click Score: 20    End of Session   Activity Tolerance: Patient tolerated treatment well Patient left: in bed Nurse Communication: Mobility status PT Visit Diagnosis: Other abnormalities of gait and mobility  (R26.89);Muscle weakness (generalized) (M62.81);Difficulty in walking, not elsewhere classified (R26.2)    Time: 8746-8687 PT Time Calculation (min) (ACUTE ONLY): 19 min   Charges:   PT Evaluation $PT Eval Low Complexity: 1 Low   PT General Charges $$ ACUTE PT VISIT: 1 Visit         Sherlean Lesches DPT, PT    Floyd Lusignan A Jet Traynham 01/05/2025, 1:23 PM

## 2025-01-06 ENCOUNTER — Other Ambulatory Visit: Payer: Self-pay

## 2025-01-06 DIAGNOSIS — J9601 Acute respiratory failure with hypoxia: Secondary | ICD-10-CM | POA: Diagnosis not present

## 2025-01-06 LAB — CBC
HCT: 36.5 % — ABNORMAL LOW (ref 39.0–52.0)
Hemoglobin: 11 g/dL — ABNORMAL LOW (ref 13.0–17.0)
MCH: 23.9 pg — ABNORMAL LOW (ref 26.0–34.0)
MCHC: 30.1 g/dL (ref 30.0–36.0)
MCV: 79.2 fL — ABNORMAL LOW (ref 80.0–100.0)
Platelets: 103 K/uL — ABNORMAL LOW (ref 150–400)
RBC: 4.61 MIL/uL (ref 4.22–5.81)
RDW: 16 % — ABNORMAL HIGH (ref 11.5–15.5)
WBC: 5.1 K/uL (ref 4.0–10.5)
nRBC: 0 % (ref 0.0–0.2)

## 2025-01-06 LAB — TROPONIN T, HIGH SENSITIVITY: Troponin T High Sensitivity: 21 ng/L — ABNORMAL HIGH (ref 0–19)

## 2025-01-06 LAB — CBG MONITORING, ED
Glucose-Capillary: 192 mg/dL — ABNORMAL HIGH (ref 70–99)
Glucose-Capillary: 196 mg/dL — ABNORMAL HIGH (ref 70–99)
Glucose-Capillary: 171 mg/dL — ABNORMAL HIGH (ref 70–99)
Glucose-Capillary: 157 mg/dL — ABNORMAL HIGH (ref 70–99)

## 2025-01-06 LAB — BASIC METABOLIC PANEL WITH GFR
Anion gap: 8 (ref 5–15)
BUN: 19 mg/dL (ref 8–23)
CO2: 34 mmol/L — ABNORMAL HIGH (ref 22–32)
Calcium: 8.5 mg/dL — ABNORMAL LOW (ref 8.9–10.3)
Chloride: 100 mmol/L (ref 98–111)
Creatinine, Ser: 1.05 mg/dL (ref 0.61–1.24)
GFR, Estimated: >60 mL/min
Glucose, Bld: 214 mg/dL — ABNORMAL HIGH (ref 70–99)
Potassium: 3.7 mmol/L (ref 3.5–5.1)
Sodium: 141 mmol/L (ref 135–145)

## 2025-01-06 MED ORDER — METOPROLOL TARTRATE 50 MG PO TABS
50.0000 mg | ORAL_TABLET | Freq: Two times a day (BID) | ORAL | Status: DC
  Administered 2025-01-06 – 2025-01-07 (×3): 50 mg via ORAL
  Filled 2025-01-06 (×3): qty 1

## 2025-01-06 MED ORDER — ATORVASTATIN CALCIUM 20 MG PO TABS
40.0000 mg | ORAL_TABLET | Freq: Every day | ORAL | Status: DC
  Administered 2025-01-06 – 2025-01-07 (×2): 40 mg via ORAL
  Filled 2025-01-06 (×2): qty 2

## 2025-01-06 MED ORDER — FUROSEMIDE 10 MG/ML IJ SOLN: 40.0000 mg | Freq: Every day | INTRAMUSCULAR | Status: DC

## 2025-01-06 NOTE — Progress Notes (Signed)
 " PROGRESS NOTE    Jerry Parsons  FMW:[id number] DOB: Oct 24, 1951 DOA: 01/04/2025 PCP: Bertrum Charlie CROME, MD  Subjective: No acute events overnight. Seen and examined at bedside. Reports feeling better with improvement in shortness of breath. As per nursing, developed recurrent L sided chest pressure. Tolerating oral intake without n/v. Denies constipation.    Hospital Course:  74 y.o. male with medical history significant of A-fib and PE/DVT on Eliquis , dCHF, HTN, HLD, DM, TIA, depression, BPH, CKD-3A, , thrombocytopenia, chronic pain, right eye blindness, obesity, former smoker, who presents with chest pain and SOB.   Pt had A fib cardioversion procedure yesterday. About 4 hours after, pt began having CP and SOB.  His shortness of breath has been progressively worsening.  Patient has a dry cough, no fever or chills. The chest pain is located in the left side of chest where the conversion shock was delivered.  He has tenderness to the same location on palpation.  Patient does not have nausea, vomiting, diarrhea or abdominal pain.  No symptoms UTI.   Patient is normally not using oxygen  but was found to have moderate acute respiratory distress, oxygen  desaturation to 74 and 83% on room air, which improved to 97% on 3 L oxygen .   Assessment and Plan:  Acute respiratory failure with hypoxia (HCC)  Acute on chronic diastolic congestive heart failure (HCC):  Currently on 3L oxygen , not on oxygen  at home Patient has leg edema, interstitial pulm edema chest x-ray, crackles on auscultation, clinically consistent with CHF exacerbation.  Procalcitonin 324 which is likely falsely low due to obesity.  Chest x-ray showed right middle lobe infiltration, but patient does not have fever or leukocytosis.  Procalcitonin<0.10.  Clinically does not seem to have pneumonia.  Patient received 1 dose of Rocephin  and doxycycline  in ED, will not continue antibiotics.  2D echo on 02/02/2024 showed EF of 60-65%.  -decrease  Lasix  40 mg IV bid to daily -Daily weights -strict I/O's -Low salt diet -Fluid restriction -As needed bronchodilators for shortness of breath - incentive spirometry - OOB to chair during daytime - encourage ambulation - monitor on tele   Chest pain Troponin elevation:  Transient in nature The chest pain is located in the left side of chest where the conversion shock was delivered.  He has tenderness to the same location on palpation, most likely due to musculoskeletal pain.   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  Platelet 119. -Follow-up with CBC   Type II diabetes mellitus with renal manifestations [facility]): Recent A1c 8.8, poorly controlled.  Patient is taking NovoLog , Januvia , glargine insulin  48 units twice daily -SSI - Glargine insulin  38 units twice daily   Chronic kidney disease, stage 3a [facility]): Renal function stable.  Recent creatinine 1.74 on 05/03/2024.  His creatinine is 1.29, BUN 24, GFR 58. - Follow-up with BMP   OSA - on CPAP  Depression: -Continue home medications   Obesity (BMI 30-39.9): Patient has Obesity Class II, with body weight 119.7 Kg and BMI 36.82 kg/m2.  - Encourage losing weight - Exercise and healthy diet  DVT prophylaxis:  apixaban  (ELIQUIS ) tablet 5 mg  Eliquis    Code Status: Full Code  Disposition Plan: Home Reason for continuing need for  hospitalization: IV diuresis  Objective: Vitals:   01/06/25 1034 01/06/25 1040 01/06/25 1258 01/06/25 1335  BP:  (!) 162/79 (!) 152/66   Pulse:  91 (!) 122   Resp:  (!) 24 13   Temp: 99.3 F (37.4 C)   99 F (37.2 C)  TempSrc: Oral   Oral  SpO2:  90% 93%     Intake/Output Summary (Last 24 hours) at 01/06/2025 1359 Last data filed at 01/06/2025 0848 Gross per 24 hour  Intake --  Output 2525 ml  Net -2525 ml   There were no vitals filed for this visit.  Examination:  Physical Exam Vitals and nursing note reviewed.  Constitutional:      General: He is not in acute distress.    Appearance: He is ill-appearing.  HENT:     Head: Normocephalic and atraumatic.  Cardiovascular:     Rate and Rhythm: Normal rate and regular rhythm.     Pulses: Normal pulses.     Heart sounds: Normal heart sounds.  Pulmonary:     Effort: Pulmonary effort is normal. No respiratory distress.     Breath sounds: Normal breath sounds. No wheezing.  Abdominal:     General: Bowel sounds are normal.     Palpations: Abdomen is soft.  Neurological:     Mental Status: He is alert.     Data Reviewed: I have personally reviewed following labs and imaging studies  CBC: Recent Labs  Lab 01/04/25 1632 01/05/25 0435 01/06/25 0611  WBC 8.7 6.7 5.1  HGB 11.3* 10.2* 11.0*  HCT 37.2* 34.3* 36.5*  MCV 79.1* 79.8* 79.2*  PLT 119* 94* 103*   Basic Metabolic Panel: Recent Labs  Lab 01/04/25 1632 01/05/25 0435 01/06/25 0611  NA 138 141 141  K 4.5 3.8 3.7  CL 99 99 100  CO2 31 34* 34*  GLUCOSE 163* 127* 214*  BUN 24* 23 19  CREATININE 1.29* 1.27* 1.05  CALCIUM  8.7* 8.1* 8.5*  MG  --  2.2  --    GFR: Estimated Creatinine Clearance: 81.3 mL/min (by C-G formula based on SCr of 1.05 mg/dL). Liver Function Tests: No results for input(s): AST, ALT, ALKPHOS, BILITOT, PROT, ALBUMIN in the last 168 hours. No results for input(s): LIPASE, AMYLASE in the last 168 hours. No results for  input(s): AMMONIA in the last 168 hours. Coagulation Profile: Recent Labs  Lab 01/04/25 1632  INR 1.5*   Cardiac Enzymes: No results for input(s): CKTOTAL, CKMB, CKMBINDEX, TROPONINI in the last 168 hours. ProBNP, BNP (last 5 results) Recent Labs    04/12/24 1559 01/04/25 1632  PROBNP  --  324.0*  BNP 32.1  --    HbA1C: No results for input(s): HGBA1C in the last 72 hours. CBG: Recent Labs  Lab 01/05/25 1142 01/05/25 1654 01/05/25 2246 01/06/25 0747 01/06/25 1208  GLUCAP 171* 230* 282* 192* 196*   Lipid Profile: No results for input(s): CHOL, HDL, LDLCALC, TRIG, CHOLHDL, LDLDIRECT in the last 72 hours. Thyroid  Function Tests: No results for input(s): TSH, T4TOTAL, FREET4, T3FREE, THYROIDAB in the last 72 hours. Anemia Panel: No results for input(s): VITAMINB12, FOLATE, FERRITIN, TIBC, IRON, RETICCTPCT  in the last 72 hours. Sepsis Labs: Recent Labs  Lab 01/04/25 1850  PROCALCITON <0.10    Recent Results (from the past 240 hours)  Resp panel by RT-PCR (RSV, Flu A&B, Covid) Anterior Nasal Swab     Status: None   Collection Time: 01/04/25  5:29 PM   Specimen: Anterior Nasal Swab  Result Value Ref Range Status   SARS Coronavirus 2 by RT PCR NEGATIVE NEGATIVE Final    Comment: (NOTE) SARS-CoV-2 target nucleic acids are NOT DETECTED.  The SARS-CoV-2 RNA is generally detectable in upper respiratory specimens during the acute phase of infection. The lowest concentration of SARS-CoV-2 viral copies this assay can detect is 138 copies/mL. A negative result does not preclude SARS-Cov-2 infection and should not be used as the sole basis for treatment or other patient management decisions. A negative result may occur with  improper specimen collection/handling, submission of specimen other than nasopharyngeal swab, presence of viral mutation(s) within the areas targeted by this assay, and inadequate number of viral copies(<138  copies/mL). A negative result must be combined with clinical observations, patient history, and epidemiological information. The expected result is Negative.  Fact Sheet for Patients:  bloggercourse.com  Fact Sheet for Healthcare Providers:  seriousbroker.it  This test is no t yet approved or cleared by the [place]  FDA and  has been authorized for detection and/or diagnosis of SARS-CoV-2 by FDA under an Emergency Use Authorization (EUA). This EUA will remain  in effect (meaning this test can be used) for the duration of the COVID-19 declaration under Section 564(b)(1) of the Act, 21 U.S.C.section 360bbb-3(b)(1), unless the authorization is terminated  or revoked sooner.       Influenza A by PCR NEGATIVE NEGATIVE Final   Influenza B by PCR NEGATIVE NEGATIVE Final    Comment: (NOTE) The Xpert Xpress SARS-CoV-2/FLU/RSV plus assay is intended as an aid in the diagnosis of influenza from Nasopharyngeal swab specimens and should not be used as a sole basis for treatment. Nasal washings and aspirates are unacceptable for Xpert Xpress SARS-CoV-2/FLU/RSV testing.  Fact Sheet for Patients: bloggercourse.com  Fact Sheet for Healthcare Providers: seriousbroker.it  This test is not yet approved or cleared by the [place]  FDA and has been authorized for detection and/or diagnosis of SARS-CoV-2 by FDA under an Emergency Use Authorization (EUA). This EUA will remain in effect (meaning this test can be used) for the duration of the COVID-19 declaration under Section 564(b)(1) of the Act, 21 U.S.C. section 360bbb-3(b)(1), unless the authorization is terminated or revoked.     Resp Syncytial Virus by PCR NEGATIVE NEGATIVE Final    Comment: (NOTE) Fact Sheet for Patients: bloggercourse.com  Fact Sheet for Healthcare  Providers: seriousbroker.it  This test is not yet approved or cleared by the [place]  FDA and has been authorized for detection and/or diagnosis of SARS-CoV-2 by FDA under an Emergency Use Authorization (EUA). This EUA will remain in effect (meaning this test can be used) for the duration of the COVID-19 declaration under Section 564(b)(1) of the Act, 21 U.S.C. section 360bbb-3(b)(1), unless the authorization is terminated or revoked.  Performed at [facility], [address] [place], [place] [address]   Culture, blood (Routine X 2) w Reflex to ID Panel     Status: None (Preliminary result)   Collection Time: 01/04/25  6:50 PM   Specimen: BLOOD  Result Value Ref Range Status   Specimen Description BLOOD BLOOD RIGHT FOREARM  Final   Special Requests   Final  BOTTLES DRAWN AEROBIC AND ANAEROBIC Blood Culture adequate volume   Culture   Final    NO GROWTH 2 DAYS Performed at [facility], [address] [place], [place] [address]    Report Status PENDING  Incomplete  Culture, blood (Routine X 2) w Reflex to ID Panel     Status: None (Preliminary result)   Collection Time: 01/04/25  6:50 PM   Specimen: BLOOD  Result Value Ref Range Status   Specimen Description BLOOD BLOOD LEFT FOREARM  Final   Special Requests   Final    BOTTLES DRAWN AEROBIC AND ANAEROBIC Blood Culture adequate volume   Culture   Final    NO GROWTH 2 DAYS Performed at [facility], [address] [place], [place] [address]    Report Status PENDING  Incomplete  Respiratory (~20 pathogens) panel by PCR     Status: None   Collection Time: 01/04/25  6:59 PM   Specimen: Nasopharyngeal Swab; Respiratory  Result Value Ref Range Status   Adenovirus NOT DETECTED NOT DETECTED Final   Coronavirus 229E NOT DETECTED NOT DETECTED Final    Comment: (NOTE) The Coronavirus on the Respiratory Panel, DOES NOT test for the novel  Coronavirus (2019 nCoV)     Coronavirus HKU1 NOT DETECTED NOT DETECTED Final   Coronavirus NL63 NOT DETECTED NOT DETECTED Final   Coronavirus OC43 NOT DETECTED NOT DETECTED Final   Metapneumovirus NOT DETECTED NOT DETECTED Final   Rhinovirus / Enterovirus NOT DETECTED NOT DETECTED Final   Influenza A NOT DETECTED NOT DETECTED Final   Influenza B NOT DETECTED NOT DETECTED Final   Parainfluenza Virus 1 NOT DETECTED NOT DETECTED Final   Parainfluenza Virus 2 NOT DETECTED NOT DETECTED Final   Parainfluenza Virus 3 NOT DETECTED NOT DETECTED Final   Parainfluenza Virus 4 NOT DETECTED NOT DETECTED Final   Respiratory Syncytial Virus NOT DETECTED NOT DETECTED Final   Bordetella pertussis NOT DETECTED NOT DETECTED Final   Bordetella Parapertussis NOT DETECTED NOT DETECTED Final   Chlamydophila pneumoniae NOT DETECTED NOT DETECTED Final   Mycoplasma pneumoniae NOT DETECTED NOT DETECTED Final    Comment: Performed at [facility], [address] [place], [place] [address]     Radiology Studies: DG Chest 2 View Result Date: 01/04/2025 EXAM: 2 VIEW(S) XRAY OF THE CHEST 01/04/2025 04:50:00 PM COMPARISON: 04/27/2024 CLINICAL HISTORY: sob FINDINGS: LUNGS AND PLEURA: Small right pleural effusion. Diffuse interstitial prominence. Focal airspace opacity in right mid lung. No pneumothorax. HEART AND MEDIASTINUM: No acute abnormality of the cardiac and mediastinal silhouettes. BONES AND SOFT TISSUES: Partially visualized lumbar spine fusion hardware. Multilevel degenerative changes of thoracic spine. IMPRESSION: 1. Right midlung pneumonia. 2. Small right pleural effusion. 3. Interstitial pulmonary edema. Electronically signed by: Greig Pique MD MD 01/04/2025 05:19 PM EST RP Workstation: [id number]    Scheduled Meds:  amiodarone   400 mg Oral BID   apixaban   5 mg Oral BID   aspirin  EC  81 mg Oral QPM   atorvastatin   40 mg Oral Daily   dutasteride   0.5 mg Oral Daily   [START ON 01/07/2025] furosemide   40 mg Intravenous Daily    gabapentin   300 mg Oral QHS   insulin  aspart  0-5 Units Subcutaneous QHS   insulin  aspart  0-9 Units Subcutaneous TID WC   insulin  glargine-yfgn  38 Units Subcutaneous BID   ipratropium-albuterol   3 mL Nebulization Q4H   magnesium  oxide  400 mg Oral BID   metoprolol  tartrate  50  mg Oral BID   nortriptyline   30 mg Oral QHS   oxyCODONE   40 mg Oral Q8H   pregabalin   200 mg Oral TID   rOPINIRole   2 mg Oral QHS   tamsulosin   0.4 mg Oral Daily   Continuous Infusions:   LOS: 2 days   Jerry Bar, MD  [facility]  01/06/2025, 1:59 PM   "

## 2025-01-07 DIAGNOSIS — J9601 Acute respiratory failure with hypoxia: Secondary | ICD-10-CM | POA: Diagnosis not present

## 2025-01-07 LAB — BASIC METABOLIC PANEL WITH GFR
Anion gap: 7 (ref 5–15)
BUN: 18 mg/dL (ref 8–23)
CO2: 35 mmol/L — ABNORMAL HIGH (ref 22–32)
Calcium: 8.9 mg/dL (ref 8.9–10.3)
Chloride: 98 mmol/L (ref 98–111)
Creatinine, Ser: 1.04 mg/dL (ref 0.61–1.24)
GFR, Estimated: >60 mL/min
Glucose, Bld: 99 mg/dL (ref 70–99)
Potassium: 4.3 mmol/L (ref 3.5–5.1)
Sodium: 139 mmol/L (ref 135–145)

## 2025-01-07 LAB — CBG MONITORING, ED
Glucose-Capillary: 91 mg/dL (ref 70–99)
Glucose-Capillary: 128 mg/dL — ABNORMAL HIGH (ref 70–99)

## 2025-01-07 MED ORDER — TORSEMIDE 20 MG PO TABS
20.0000 mg | ORAL_TABLET | Freq: Every day | ORAL | Status: DC
  Administered 2025-01-07: 20 mg via ORAL
  Filled 2025-01-07: qty 1

## 2025-01-07 NOTE — Progress Notes (Addendum)
 Heart Failure Stewardship Pharmacy Note  PCP: Bertrum Charlie CROME, MD PCP-Cardiologist: Evalene Lunger, MD  HPI: Jerry Parsons is a 74 y.o. male with A-fib and PE/DVT on Eliquis , dCHF, HTN, HLD, DM, TIA, depression, BPH, CKD-3A, , thrombocytopenia, chronic pain, right eye blindness, obesity, former smoker, who presented with chest pain and SOB. Pt had A-fib cardioversion the day prior, chest pain and SOB began 4 hours after and progressively worsened.  On admission, proBNP was 324, HS-troponin was 23, negative PCR for COVID, flu, and RSV, stable renal function and procalcitonin <0.10. Chest x-ray noted right midlung pneumonia, small right pleural effusion, and interstitial pulmonary edema.   Pertinent cardiac history: - PE/DVT in 2011. - Patient presented with tachycardia, TTE completed 03/2011, noted LVEF 55-60%, grade I diastolic dysfunction.  - 02/2013 cardioversion - Patient presented with concern for stroke/TIA, TTE completed 08/2015, noted normal systolic function, grade I diastolic dysfunction. US  carotid bilateral noted minimal to mild plaque formation with less than 50% diameter stenosis. - VAS US  ABI in 02/2018 noted no evidence of significant lower extremity arterial disease. The right toe-branchial index was abnormal. - Patient presented with bacteremia, TTE completed 03/2018, noted LVEF 55-60%. No vegetations. - Patient presented with syncope, TTE completed 05/2010, noted LVEF 60-65%, mild LVH. - 06/2020 long term monitor results: Atrial Flutter occurred continuously (100% burden), ranging from 51-139 bpm (avg of 80 bpm). Isolated VEs were rare (<1.0%), VE Couplets were rare (<1.0%), and no VE Triplets were present. Patient triggered events were not associated with significant arrhythmia. TTE on 02/02/24 noted LVEF 60-65%, mild mitral valve regurgitation.  Pertinent Lab Values: Creat  Date Value Ref Range Status  12/13/2017 1.39 (H) 0.70 - 1.25 mg/dL Final    Comment:    For  patients >11 years of age, the reference limit for Creatinine is approximately 13% higher for people identified as African-American. .    Creatinine, Ser  Date Value Ref Range Status  01/07/2025 1.04 0.61 - 1.24 mg/dL Final   BUN  Date Value Ref Range Status  01/07/2025 18 8 - 23 mg/dL Final  94/91/7974 36 (H) 8 - 27 mg/dL Final  95/82/7985 11 7 - 18 mg/dL Final   Potassium  Date Value Ref Range Status  01/07/2025 4.3 3.5 - 5.1 mmol/L Final  04/12/2013 4.0 3.5 - 5.1 mmol/L Final   Sodium  Date Value Ref Range Status  01/07/2025 139 135 - 145 mmol/L Final  05/03/2024 141 134 - 144 mmol/L Final  04/12/2013 137 136 - 145 mmol/L Final   B Natriuretic Peptide  Date Value Ref Range Status  04/12/2024 32.1 0.0 - 100.0 pg/mL Final    Comment:    Performed at [facility], [address] [place], [place] [address]   Magnesium   Date Value Ref Range Status  01/05/2025 2.2 1.7 - 2.4 mg/dL Final    Comment:    Performed at [facility], [address] [place], [place] [address]   Hgb A1c MFr Bld  Date Value Ref Range Status  04/12/2024 8.8 (H) 4.8 - 5.6 % Final    Comment:    (NOTE) Pre diabetes:          5.7%-6.4%  Diabetes:              >6.4%  Glycemic control for   <7.0% adults with diabetes    TSH  Date Value Ref Range Status  06/08/2022 6.660 (H) 0.450 - 4.500 uIU/mL Final    Vital Signs: Temp:  [  98.4 F (36.9 C)-99.4 F (37.4 C)] 98.4 F (36.9 C) (01/12 0257) Pulse Rate:  [63-122] 101 (01/12 0755) Cardiac Rhythm: Atrial fibrillation (01/12 0004) Resp:  [8-24] 10 (01/12 0755) BP: (106-162)/(58-83) 136/68 (01/12 0755) SpO2:  [90 %-100 %] 91 % (01/12 0755)  Intake/Output Summary (Last 24 hours) at 01/07/2025 0806 Last data filed at 01/06/2025 0848 Gross per 24 hour  Intake --  Output 625 ml  Net -625 ml    Current Heart Failure Medications:  Loop diuretic: torsemide  20 mg daily Beta-Blocker: metoprolol  tartrate 50 mg  BID ACEI/ARB/ARNI: none MRA: none SGLT2i: none Other: none  Prior to admission Heart Failure Medications:  Loop diuretic: torsemide  20 mg every other day and prn weight gain Beta-Blocker: metoprolol  tartrate 50 mg BID ACEI/ARB/ARNI: none MRA: none SGLT2i: Farxiga  10 mg daily. (held for cardioversion) Other: none  Assessment: 1. Acute on chronic diastolic heart failure (LVEF 60-65%) mild mitral valve regurgitation., due to mixed ICM and NICM. NYHA class III-IV symptoms.   -Symptoms: Reports improved SOB. Does not lay flat, has an adjustable bed at home. Denies chest pain. Reports appetite is fine.  -Volume: Bilateral leg edema present, though patient's wife states this is the best his legs have looked in years. Reports urinating frequently, appears dark. -Hemodynamics: labile BP and HR. -SGLT2i: Consider restarting Farxiga  10 mg daily today. A1c 8.8%. No symptoms of UTI.  -MRA: Previously on spironolactone 25 mg daily, which was discontinued in 01/2024 due to AKI. AKI secondary to spironolactone likely a hypovolemia issue. Patient was started on Kerendia, which was discontinued in 11/2024 due to hyperkalemia. Can consider a trial outpatient of low-dose spironolactone with close renal function monitoring and encourage adequate hydration. -ARNI: Previously on telmisartan  10 mg daily, which was discontinued due in 11/2024 due to hyperkalemia and hypotension. ARNI preferred over ACEI/ARB in HFpEF, but have more significant impact on BP reduction. Could consider ARB for renal protective benefit in comorbid CKD and DM. Can consider losartan 25 mg daily, may have less impact on K and BP vs telmisartan . K 4.3, Scr 1.01.  Plan: 1) Medication changes recommended at this time: - Consider restarting Farxiga  10 mg daily today if no procedural sedation is expected  2) Patient assistance: None at this time.   3) Education: - Patient has been educated on current HF medications and potential additions  to HF medication regimen - Patient verbalizes understanding that over the next few months, these medication doses may change and more medications may be added to optimize HF regimen - Patient has been educated on basic disease state pathophysiology and goals of therapy   Jerry Parsons, [email or web address] PharmD Student  Please do not hesitate to reach out with questions or concerns,  Jerry Parsons, PharmD, CPP, BCPS, [facility] Heart Failure Pharmacist  Phone - [phone] 01/07/2025 2:57 PM

## 2025-01-07 NOTE — Discharge Summary (Signed)
 " [facility] Phys[facility] Discharge Summary   Patient name: Jerry Parsons  Admit date:     01/04/2025  Discharge date: 01/07/2025  Attending Physician: NIU, XILIN [[id number]]  Discharge Physician: Norval Bar   PCP: Bertrum Charlie CROME, MD  Admitted From: Home  Disposition:  Home  Recommendations for Outpatient Follow-up:  Follow up with PCP in 1-2 weeks Follow up with cardiology as scheduled  Home Health:Yes Equipment/Devices: @ECDMELIST @  Discharge Condition:Stable CODE STATUS:FULL Diet recommendation: Heart Healthy/Diabetic Fluid Restriction: None  Hospital Summary:  74 y.o. male with medical history significant of A-fib and PE/DVT on Eliquis , dCHF, HTN, HLD, DM, TIA, depression, BPH, CKD-3A, , thrombocytopenia, chronic pain, right eye blindness, obesity, former smoker, who presents with chest pain and SOB.   Pt had A fib cardioversion procedure yesterday. About 4 hours after, pt began having CP and SOB.  His shortness of breath has been progressively worsening.  Patient has a dry cough, no fever or chills. The chest pain is located in the left side of chest where the conversion shock was delivered.  He has tenderness to the same location on palpation.  Patient does not have nausea, vomiting, diarrhea or abdominal pain.  No symptoms UTI.   Patient is normally not using oxygen  but was found to have moderate acute respiratory distress, oxygen  desaturation to 74 and 83% on room air, which improved to 97% on 3 L oxygen . Responded well to IV diuresis. Weaned down oxygen  to 2L at this time. Transitioned to home oral diuretic - given script for home oxygen  - stable for discharge home to follow up with cardiology as scheduled and PCP within one week  Discharge Diagnoses:  Principal Problem:   Acute respiratory failure with hypoxia (HCC) Active Problems:   Acute on chronic diastolic congestive heart failure (HCC)   Chest pain   HTN (hypertension)   Hyperlipidemia   Paroxysmal  atrial fibrillation (HCC)   DVT (deep venous thrombosis) (HCC)   Hx of pulmonary embolus   TIA (transient ischemic attack)   Thrombocytopenia   Type II diabetes mellitus with renal manifestations (HCC)   Chronic kidney disease, stage 3a (HCC)   OSA on CPAP   Depression   Obesity (BMI 30-39.9)   Discharge Instructions  Discharge Instructions     For home use only DME oxygen    Complete by: As directed    Length of Need: 12 Months   Liters per Minute: 2   Frequency: Continuous (stationary and portable oxygen  unit needed)   Oxygen  delivery system:  Gas Portable concentrator (POC)        Allergies as of 01/07/2025       Reactions   Carisoprodol Itching, Nausea And Vomiting   Reaction:  Unknown   Sensitivity   Reaction:  Unknown     Reaction:  Unknown     Sensitivity     Reaction:  Unknown        Medication List     STOP taking these medications    fexofenadine 180 MG tablet Commonly known as: ALLEGRA   mupirocin  ointment 2 % Commonly known as: BACTROBAN        TAKE these medications    rOPINIRole  2 MG tablet Commonly known as: REQUIP  TAKE 1 TABLET BY MOUTH TWICE A DAY AS NEEDED What changed:  when to take this Another medication with the same name was removed. Continue taking this medication, and follow the directions you see here. The timing of this medication is very important.   amiodarone   200 MG tablet Commonly known as: PACERONE  400 mg (2 tabs) twice daily for 1 week, 200 mg (1 tab) twice daily for 1 week, then 200 mg once daily as directed by Dr Gollan What changed:  how much to take how to take this when to take this additional instructions   Aspirin  Adult Low Dose 81 MG tablet Generic drug: aspirin  EC Take 81 mg by mouth every evening.   atorvastatin  40 MG tablet Commonly known as: LIPITOR Take 1 tablet (40 mg total) by mouth daily.   Basaglar  KwikPen 100 UNIT/ML INJECT 40 UNITS            SUBCUTANEOUSLY TWO TIMES A DAY What  changed: See the new instructions.   docusate sodium  100 MG capsule Commonly known as: COLACE Take 100 mg by mouth See admin instructions. Twice daily on Mon, Wed, and Fri   dutasteride  0.5 MG capsule Commonly known as: AVODART  Take 0.5 mg by mouth daily.   Eliquis  5 MG Tabs tablet Generic drug: apixaban  TAKE 1 TABLET TWICE A DAY   Farxiga  10 MG Tabs tablet Generic drug: dapagliflozin  propanediol TAKE 1 TABLET BY MOUTH EVERY DAY   Fish Oil 1200 MG Caps Take 1,200 mg by mouth 2 (two) times daily.   gabapentin  300 MG capsule Commonly known as: NEURONTIN  Take 1 capsule (300 mg total) by mouth at bedtime. What changed: Another medication with the same name was removed. Continue taking this medication, and follow the directions you see here.   magnesium  oxide 400 (240 Mg) MG tablet Commonly known as: MAG-OX Take 1 tablet by mouth 2 (two) times daily.   metoprolol  tartrate 50 MG tablet Commonly known as: LOPRESSOR  Take 50 mg by mouth 2 (two) times daily.   naloxone  4 MG/0.1ML Liqd nasal spray kit Commonly known as: NARCAN  Please use in the event of overdose from narcotic medication   nortriptyline  10 MG capsule Commonly known as: PAMELOR  Take 30 mg by mouth at bedtime.   NovoLOG  FlexPen 100 UNIT/ML FlexPen Generic drug: insulin  aspart INJECT 14 UNITS            SUBCUTANEOUSLY IN THE      MORNING AND AT BEDTIME   OneTouch Ultra test strip Generic drug: glucose blood USE WITH METER TWICE DAILY   oxyCODONE  40 mg 12 hr tablet Commonly known as: OxyCONTIN  Take 1 tablet (40 mg total) by mouth every 8 (eight) hours as needed. What changed: when to take this   polyethylene glycol 17 g packet Commonly known as: MIRALAX  / GLYCOLAX  Take 17 g by mouth daily as needed for mild constipation or moderate constipation.   pregabalin  200 MG capsule Commonly known as: LYRICA  TAKE 1 CAPSULE 3 TIMES A   DAY   sitaGLIPtin  100 MG tablet Commonly known as: JANUVIA  Take 100 mg by  mouth daily.   tamsulosin  0.4 MG Caps capsule Commonly known as: FLOMAX  Take 0.4 mg by mouth daily.   torsemide  20 MG tablet Commonly known as: DEMADEX  Take 1 tablet (20 mg total) by mouth every other day. Take one tablet as needed for a weight gain of 2 lbs in 24 hrs or 5 lbs in a week.               Durable Medical Equipment  (From admission, onward)           Start     Ordered   01/07/25 0000  For home use only DME oxygen        Question Answer  Comment  Length of Need 12 Months   Liters per Minute 2   Frequency Continuous (stationary and portable oxygen  unit needed)   Oxygen  delivery system: Gas   Oxygen  delivery system: Portable concentrator (POC)      01/07/25 1459            Allergies[1]  Discharge Exam: Vitals:   01/07/25 1415 01/07/25 1500  BP: 125/72 (!) 138/97  Pulse: 74 82  Resp: 14 11  Temp:  98 F (36.7 C)  SpO2: 96% 95%    Physical Exam Vitals and nursing note reviewed.  Constitutional:      General: He is not in acute distress.    Appearance: He is ill-appearing.  HENT:     Head: Normocephalic and atraumatic.  Cardiovascular:     Rate and Rhythm: Normal rate and regular rhythm.     Pulses: Normal pulses.     Heart sounds: Normal heart sounds.  Pulmonary:     Effort: Pulmonary effort is normal.     Breath sounds: Normal breath sounds.  Abdominal:     General: Bowel sounds are normal.     Palpations: Abdomen is soft.  Musculoskeletal:     Right lower leg: No edema.     Left lower leg: No edema.  Neurological:     Mental Status: He is alert.     The results of significant diagnostics from this hospitalization (including imaging, microbiology, ancillary and laboratory) are listed below for reference.    Microbiology: Recent Results (from the past 240 hours)  Resp panel by RT-PCR (RSV, Flu A&B, Covid) Anterior Nasal Swab     Status: None   Collection Time: 01/04/25  5:29 PM   Specimen: Anterior Nasal Swab  Result Value  Ref Range Status   SARS Coronavirus 2 by RT PCR NEGATIVE NEGATIVE Final    Comment: (NOTE) SARS-CoV-2 target nucleic acids are NOT DETECTED.  The SARS-CoV-2 RNA is generally detectable in upper respiratory specimens during the acute phase of infection. The lowest concentration of SARS-CoV-2 viral copies this assay can detect is 138 copies/mL. A negative result does not preclude SARS-Cov-2 infection and should not be used as the sole basis for treatment or other patient management decisions. A negative result may occur with  improper specimen collection/handling, submission of specimen other than nasopharyngeal swab, presence of viral mutation(s) within the areas targeted by this assay, and inadequate number of viral copies(<138 copies/mL). A negative result must be combined with clinical observations, patient history, and epidemiological information. The expected result is Negative.  Fact Sheet for Patients:  bloggercourse.com  Fact Sheet for Healthcare Providers:  seriousbroker.it  This test is no t yet approved or cleared by the [place]  FDA and  has been authorized for detection and/or diagnosis of SARS-CoV-2 by FDA under an Emergency Use Authorization (EUA). This EUA will remain  in effect (meaning this test can be used) for the duration of the COVID-19 declaration under Section 564(b)(1) of the Act, 21 U.S.C.section 360bbb-3(b)(1), unless the authorization is terminated  or revoked sooner.       Influenza A by PCR NEGATIVE NEGATIVE Final   Influenza B by PCR NEGATIVE NEGATIVE Final    Comment: (NOTE) The Xpert Xpress SARS-CoV-2/FLU/RSV plus assay is intended as an aid in the diagnosis of influenza from Nasopharyngeal swab specimens and should not be used as a sole basis for treatment. Nasal washings and aspirates are unacceptable for Xpert Xpress SARS-CoV-2/FLU/RSV testing.  Fact Sheet for  Patients: bloggercourse.com  Fact Sheet for Healthcare Providers: seriousbroker.it  This test is not yet approved or cleared by the [place]  FDA and has been authorized for detection and/or diagnosis of SARS-CoV-2 by FDA under an Emergency Use Authorization (EUA). This EUA will remain in effect (meaning this test can be used) for the duration of the COVID-19 declaration under Section 564(b)(1) of the Act, 21 U.S.C. section 360bbb-3(b)(1), unless the authorization is terminated or revoked.     Resp Syncytial Virus by PCR NEGATIVE NEGATIVE Final    Comment: (NOTE) Fact Sheet for Patients: bloggercourse.com  Fact Sheet for Healthcare Providers: seriousbroker.it  This test is not yet approved or cleared by the [place]  FDA and has been authorized for detection and/or diagnosis of SARS-CoV-2 by FDA under an Emergency Use Authorization (EUA). This EUA will remain in effect (meaning this test can be used) for the duration of the COVID-19 declaration under Section 564(b)(1) of the Act, 21 U.S.C. section 360bbb-3(b)(1), unless the authorization is terminated or revoked.  Performed at [facility], [address] [place], [place] [address]   Culture, blood (Routine X 2) w Reflex to ID Panel     Status: None (Preliminary result)   Collection Time: 01/04/25  6:50 PM   Specimen: BLOOD  Result Value Ref Range Status   Specimen Description BLOOD BLOOD RIGHT FOREARM  Final   Special Requests   Final    BOTTLES DRAWN AEROBIC AND ANAEROBIC Blood Culture adequate volume   Culture   Final    NO GROWTH 3 DAYS Performed at [facility], [address], [place], [place] [address]    Report Status PENDING  Incomplete  Culture, blood (Routine X 2) w Reflex to ID Panel     Status: None (Preliminary result)   Collection Time: 01/04/25  6:50 PM   Specimen: BLOOD   Result Value Ref Range Status   Specimen Description BLOOD BLOOD LEFT FOREARM  Final   Special Requests   Final    BOTTLES DRAWN AEROBIC AND ANAEROBIC Blood Culture adequate volume   Culture   Final    NO GROWTH 3 DAYS Performed at [facility], [address] [place], [place] [address]    Report Status PENDING  Incomplete  Respiratory (~20 pathogens) panel by PCR     Status: None   Collection Time: 01/04/25  6:59 PM   Specimen: Nasopharyngeal Swab; Respiratory  Result Value Ref Range Status   Adenovirus NOT DETECTED NOT DETECTED Final   Coronavirus 229E NOT DETECTED NOT DETECTED Final    Comment: (NOTE) The Coronavirus on the Respiratory Panel, DOES NOT test for the novel  Coronavirus (2019 nCoV)    Coronavirus HKU1 NOT DETECTED NOT DETECTED Final   Coronavirus NL63 NOT DETECTED NOT DETECTED Final   Coronavirus OC43 NOT DETECTED NOT DETECTED Final   Metapneumovirus NOT DETECTED NOT DETECTED Final   Rhinovirus / Enterovirus NOT DETECTED NOT DETECTED Final   Influenza A NOT DETECTED NOT DETECTED Final   Influenza B NOT DETECTED NOT DETECTED Final   Parainfluenza Virus 1 NOT DETECTED NOT DETECTED Final   Parainfluenza Virus 2 NOT DETECTED NOT DETECTED Final   Parainfluenza Virus 3 NOT DETECTED NOT DETECTED Final   Parainfluenza Virus 4 NOT DETECTED NOT DETECTED Final   Respiratory Syncytial Virus NOT DETECTED NOT DETECTED Final   Bordetella pertussis NOT DETECTED NOT DETECTED Final   Bordetella Parapertussis NOT DETECTED NOT DETECTED Final   Chlamydophila pneumoniae NOT DETECTED NOT DETECTED Final   Mycoplasma pneumoniae  NOT DETECTED NOT DETECTED Final    Comment: Performed at [facility], [address], [place], [place] [address]     Labs: ProBNP, BNP (last 5 results) Recent Labs    04/12/24 1559 01/04/25 1632  PROBNP  --  324.0*  BNP 32.1  --    Basic Metabolic Panel: Recent Labs  Lab 01/04/25 1632 01/05/25 0435 01/06/25 0611 01/07/25 0609   NA 138 141 141 139  K 4.5 3.8 3.7 4.3  CL 99 99 100 98  CO2 31 34* 34* 35*  GLUCOSE 163* 127* 214* 99  BUN 24* 23 19 18   CREATININE 1.29* 1.27* 1.05 1.04  CALCIUM  8.7* 8.1* 8.5* 8.9  MG  --  2.2  --   --    Liver Function Tests: No results for input(s): AST, ALT, ALKPHOS, BILITOT, PROT, ALBUMIN in the last 168 hours. No results for input(s): LIPASE, AMYLASE in the last 168 hours. No results for input(s): AMMONIA in the last 168 hours. CBC: Recent Labs  Lab 01/04/25 1632 01/05/25 0435 01/06/25 0611  WBC 8.7 6.7 5.1  HGB 11.3* 10.2* 11.0*  HCT 37.2* 34.3* 36.5*  MCV 79.1* 79.8* 79.2*  PLT 119* 94* 103*   Cardiac Enzymes: No results for input(s): CKTOTAL, CKMB, CKMBINDEX, TROPONINI, TROPONINIHS in the last 168 hours. BNP: No results for input(s): BNP in the last 168 hours. CBG: Recent Labs  Lab 01/06/25 1208 01/06/25 1627 01/06/25 2227 01/07/25 0749 01/07/25 1202  GLUCAP 196* 171* 157* 91 128*   D-Dimer No results for input(s): DDIMER in the last 72 hours. Hgb A1c No results for input(s): HGBA1C in the last 72 hours. Lipid Profile No results for input(s): CHOL, HDL, LDLCALC, TRIG, CHOLHDL, LDLDIRECT in the last 72 hours. Thyroid  function studies No results for input(s): TSH, T4TOTAL, FREET4, T3FREE, THYROIDAB in the last 72 hours.  Invalid input(s): FREET3 Anemia work up No results for input(s): VITAMINB12, FOLATE, FERRITIN, TIBC, IRON, RETICCTPCT in the last 72 hours. Urinalysis    Component Value Date/Time   COLORURINE YELLOW (A) 04/22/2018 0255   APPEARANCEUR Clear 09/28/2023 0924   LABSPEC 1.016 04/22/2018 0255   LABSPEC 1.015 04/12/2013 1256   PHURINE 5.0 04/22/2018 0255   GLUCOSEU Trace (A) 09/28/2023 0924   GLUCOSEU >=500 04/12/2013 1256   HGBUR NEGATIVE 04/22/2018 0255   BILIRUBINUR Negative 09/28/2023 0924   BILIRUBINUR Negative 04/12/2013 1256   KETONESUR 5 (A) 04/22/2018  0255   PROTEINUR 1+ (A) 09/28/2023 0924   PROTEINUR NEGATIVE 04/22/2018 0255   UROBILINOGEN 0.2 09/18/2020 1310   UROBILINOGEN 0.2 02/05/2010 1041   NITRITE Negative 09/28/2023 0924   NITRITE NEGATIVE 04/22/2018 0255   LEUKOCYTESUR Negative 09/28/2023 0924   LEUKOCYTESUR Negative 04/12/2013 1256   Sepsis Labs Recent Labs  Lab 01/04/25 1632 01/04/25 1850 01/05/25 0435 01/06/25 0611  PROCALCITON  --  <0.10  --   --   WBC 8.7  --  6.7 5.1    Procedures/Studies: DG Chest 2 View Result Date: 01/04/2025 EXAM: 2 VIEW(S) XRAY OF THE CHEST 01/04/2025 04:50:00 PM COMPARISON: 04/27/2024 CLINICAL HISTORY: sob FINDINGS: LUNGS AND PLEURA: Small right pleural effusion. Diffuse interstitial prominence. Focal airspace opacity in right mid lung. No pneumothorax. HEART AND MEDIASTINUM: No acute abnormality of the cardiac and mediastinal silhouettes. BONES AND SOFT TISSUES: Partially visualized lumbar spine fusion hardware. Multilevel degenerative changes of thoracic spine. IMPRESSION: 1. Right midlung pneumonia. 2. Small right pleural effusion. 3. Interstitial pulmonary edema. Electronically signed by: Greig Pique MD MD 01/04/2025 05:19 PM  EST RP Workstation: [id number]    Time coordinating discharge: 45 mins  SIGNED:  Norval Bar, MD [facility] 01/07/2025, 4:10 PM     [1]  Allergies Allergen Reactions   Carisoprodol Itching and Nausea And Vomiting    Reaction:  Unknown   Sensitivity   Reaction:  Unknown     Reaction:  Unknown     Sensitivity     Reaction:  Unknown   "

## 2025-01-07 NOTE — ED Notes (Signed)
 [employer] at bedside; home O2 delivered at this time.

## 2025-01-07 NOTE — ED Notes (Signed)
 Patient saturations on RA at rest: 92%   Patient saturations on RA while ambulating: 88%  Patient saturations on 2L [place] while ambulating: 94%

## 2025-01-07 NOTE — TOC Transition Note (Signed)
 Transition of Care [facility]) - Discharge Note   Patient Details  Name: Jerry Parsons MRN: [id number] Date of Birth: 27-Jul-1951  Transition of Care [facility]) CM/SW Contact:  Victory Jackquline RAMAN, RN Phone Number: 01/07/2025, 4:16 PM   Clinical Narrative:    Received a message from the MD via secure chat informing me that the patient was discharging Home with [facility] and needed oxygen  ordered. RNCM submitted referral's for [facility] and ordered oxygen  via [employer] via [place] to be delivered to bedside. RNCM met with patient and his wife at bedside, introduced myself and my role and explained that discharge planning would be discussed. MD is recommending HH/PT and ordered oxygen  for home use. Patient and his wife are in agreement with both. Reviewed Agencies that accepted him with wife and patient and gave them the star ratings from the Medicare.gov website and they picked [facility]. Advised them that the oxygen  would be delivered to the bedside and they verbalized understanding. No further concerns. RNCM signing off.   Final next level of care: Home w Home Health Services Barriers to Discharge: No Barriers Identified   Patient Goals and CMS Choice            Discharge Placement                Patient to be transferred to facility by: Wife Name of family member notified: Rojelio Patient and family notified of of transfer: 01/07/25  Discharge Plan and Services Additional resources added to the After Visit Summary for                  DME Arranged: Oxygen  DME Agency: [employer] Date DME Agency Contacted: 01/07/25   Representative spoke with at DME Agency: [facility] HH Arranged: PT HH Agency: [facility] Date [facility] Agency Contacted: 01/07/25   Representative spoke with at [facility] Agency: Referral sent via EPIC  Social Drivers of Health (SDOH) Interventions SDOH Screenings   Food Insecurity: No Food Insecurity (04/13/2024)  Housing: Low Risk (04/13/2024)  Transportation Needs: No  Transportation Needs (04/13/2024)  Utilities: Not At Risk (04/13/2024)  Alcohol Screen: Low Risk (12/29/2022)  Depression (PHQ2-9): Low Risk (12/29/2022)  Financial Resource Strain: Low Risk  (09/03/2023)   Received from [facility]  Social Connections: Unknown (04/13/2024)  Tobacco Use: Medium Risk (01/04/2025)     Readmission Risk Interventions    10/15/2023   12:57 PM  Readmission Risk Prevention Plan  Transportation Screening Complete  PCP or Specialist Appt within 3-5 Days Complete  HRI or Home Care Consult Complete  Social Work Consult for Recovery Care Planning/Counseling Complete  Palliative Care Screening Not Applicable  Medication Review [employer]) Complete

## 2025-01-07 NOTE — ED Notes (Signed)
 Pt resting at this time. No complaints or needs verbalized

## 2025-01-07 NOTE — TOC CM/SW Note (Signed)
 Patient saturations on RA at rest: 92%   Patient saturations on RA while ambulating: 88%  Patient saturations on 2L [place] while ambulating: 94%

## 2025-01-09 LAB — CULTURE, BLOOD (ROUTINE X 2)
Culture: NO GROWTH
Culture: NO GROWTH
Special Requests: ADEQUATE
Special Requests: ADEQUATE

## 2025-01-18 ENCOUNTER — Ambulatory Visit: Attending: Nurse Practitioner | Admitting: Nurse Practitioner

## 2025-01-18 ENCOUNTER — Encounter: Payer: Self-pay | Admitting: Nurse Practitioner

## 2025-01-18 VITALS — BP 136/62 | HR 64 | Ht 70.0 in | Wt 267.0 lb

## 2025-01-18 DIAGNOSIS — E1142 Type 2 diabetes mellitus with diabetic polyneuropathy: Secondary | ICD-10-CM | POA: Diagnosis not present

## 2025-01-18 DIAGNOSIS — I10 Essential (primary) hypertension: Secondary | ICD-10-CM

## 2025-01-18 DIAGNOSIS — R1310 Dysphagia, unspecified: Secondary | ICD-10-CM | POA: Diagnosis not present

## 2025-01-18 DIAGNOSIS — I483 Typical atrial flutter: Secondary | ICD-10-CM

## 2025-01-18 DIAGNOSIS — Z794 Long term (current) use of insulin: Secondary | ICD-10-CM | POA: Diagnosis not present

## 2025-01-18 DIAGNOSIS — I480 Paroxysmal atrial fibrillation: Secondary | ICD-10-CM | POA: Diagnosis not present

## 2025-01-18 DIAGNOSIS — I5032 Chronic diastolic (congestive) heart failure: Secondary | ICD-10-CM

## 2025-01-18 DIAGNOSIS — I700 Atherosclerosis of aorta: Secondary | ICD-10-CM | POA: Diagnosis not present

## 2025-01-18 DIAGNOSIS — E782 Mixed hyperlipidemia: Secondary | ICD-10-CM | POA: Diagnosis not present

## 2025-01-18 MED ORDER — AMIODARONE HCL 200 MG PO TABS: 200.0000 mg | ORAL_TABLET | Freq: Every day | ORAL | 3 refills | Status: AC

## 2025-01-18 NOTE — Patient Instructions (Signed)
 Medication Instructions:  Your physician recommends that you continue on your current medications as directed. Please refer to the Current Medication list given to you today.   *If you need a refill on your cardiac medications before your next appointment, please call your pharmacy*  Lab Work: None ordered at this time  If you have labs (blood work) drawn today and your tests are completely normal, you will receive your results only by: MyChart Message (if you have MyChart) OR A paper copy in the mail If you have any lab test that is abnormal or we need to change your treatment, we will call you to review the results.  Testing/Procedures: None ordered at this time   Follow-Up: At [facility], you and your health needs are our priority.  As part of our continuing mission to provide you with exceptional heart care, our providers are all part of one team.  This team includes your primary Cardiologist (physician) and Advanced Practice Providers or APPs (Physician Assistants and Nurse Practitioners) who all work together to provide you with the care you need, when you need it.  Your next appointment:   4-6 week(s)  Provider:   You may see Timothy Gollan, MD or Lonni Meager, NP

## 2025-01-18 NOTE — Progress Notes (Signed)
 "    Office Visit    Patient Name: Jerry Parsons Date of Encounter: 01/18/2025  Primary Care Provider:  Bertrum Charlie CROME, MD Primary Cardiologist:  Evalene Lunger, MD  Cardiology APP:  Vivienne Lonni Ingle, NP   Chief Complaint    74 y.o. male with a history of persistent atrial flutter on amiodarone  and Eliquis , diabetes, hypertension, hyperlipidemia, obesity, sleep apnea, stage II-III chronic kidney disease, depression, chronic back pain, spinal stenosis, osteoarthritis, TIA, and peripheral neuropathy, who present for f/u related to aflutter s/p recent DCCV followed by heart failure admission.  Past Medical History   Subjective   Past Medical History:  Diagnosis Date   Arthritis    Atrial flutter (HCC)    a. 12/2023 s/p DCCV; b. 12/2024 s/p DCCV (150J).   Blind right eye    BPH (benign prostatic hyperplasia)    Chest pain    a. 03/2024 MV: EF 56%, mild cor Ca2+. No ischemia/infarct.   Chronic heart failure with preserved ejection fraction (HFpEF) (HCC)    a. 01/2024 Echo: EF 60-65%, no rwma, nl RV fxn, mildly dil LA, mild MR, AoV sclerosis w/o stenosis.   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 06/2024 Zio: avg HR of 69 bpm (49-105), rare PACs and PVCs without triggered events.   Pulmonary embolus (HCC) 2011   Seasonal allergies    Seizures (HCC)    as child    Sleep apnea    BIPAP   Stiff neck    limited turning s/p titanium plate placement   TIA (transient ischemic attack)    Wears dentures    full upper and lower   Past Surgical History:  Procedure  Laterality Date   BACK SURGERY     x 8; upper x 3 & lower x 5   CARDIOVERSION  03/14/13, 10/16   2014 - [facility], 2016 - [facility]   CARDIOVERSION N/A 09/11/2020   Procedure: CARDIOVERSION;  Surgeon: Lunger Evalene PARAS, MD;  Location: [facility] ORS;  Service: Cardiovascular;  Laterality: N/A;   CARDIOVERSION N/A 01/25/2024   Procedure: CARDIOVERSION;  Surgeon: Lunger Evalene PARAS, MD;  Location: [facility] ORS;  Service: Cardiovascular;  Laterality: N/A;   CARDIOVERSION N/A 01/03/2025   Procedure: CARDIOVERSION;  Surgeon: Lunger Evalene PARAS, MD;  Location: [facility] ORS;  Service: Cardiovascular;  Laterality: N/A;   CATARACT EXTRACTION W/PHACO Left 10/29/2015   Procedure: CATARACT EXTRACTION PHACO AND INTRAOCULAR LENS PLACEMENT (IOC);  Surgeon: Dene Etienne, MD;  Location: [facility];  Service: Ophthalmology;  Laterality: Left;  DIABETIC - insulin  and oral medsSleep apnea - no machine   CATARACT EXTRACTION W/PHACO Right 02/16/2023   Procedure: CATARACT EXTRACTION PHACO AND INTRAOCULAR LENS PLACEMENT (IOC) RIGHT DIABETIC MALYUGIN HEALON 5 VISION BLUE  65.52  03:57.7;  Surgeon: Etienne Dene, MD;  Location: [facility];  Service: Ophthalmology;  Laterality: Right;  Diabetic   CHOLECYSTECTOMY     COLONOSCOPY WITH PROPOFOL  N/A 01/16/2018  Procedure: COLONOSCOPY WITH PROPOFOL ;  Surgeon: Viktoria Lamar DASEN, MD;  Location: [facility] END[facility];  Service: Endoscopy;  Laterality: N/A;   ESOPHAGOGASTRODUODENOSCOPY (EGD) WITH PROPOFOL  N/A 01/16/2018   Procedure: ESOPHAGOGASTRODUODENOSCOPY (EGD) WITH PROPOFOL ;  Surgeon: Viktoria Lamar DASEN, MD;  Location: [facility] ENDOSCOPY;  Service: Endoscopy;  Laterality: N/A;   EYE SURGERY     GALLBLADDER SURGERY  2002   JOINT REPLACEMENT Right 2018   KNEE ARTHROSCOPY     left    PARATHYROIDECTOMY N/A 07/25/2020   Procedure: PARATHYROIDECTOMY;  Surgeon: Marolyn Nest, MD;  Location: [facility] ORS;  Service: General;  Laterality: N/A;   ROTATOR CUFF REPAIR  2001   left    TEE WITHOUT  CARDIOVERSION N/A 04/26/2018   Procedure: TRANSESOPHAGEAL ECHOCARDIOGRAM (TEE);  Surgeon: Mady Bruckner, MD;  Location: [facility] ORS;  Service: Cardiovascular;  Laterality: N/A;   TONSILLECTOMY     TOTAL KNEE ARTHROPLASTY Right 06/20/2017   Procedure: RIGHT TOTAL KNEE ARTHROPLASTY;  Surgeon: Melodi Lerner, MD;  Location: [facility] ORS;  Service: Orthopedics;  Laterality: Right;    Allergies  Allergies[1]     History of Present Illness      74 y.o. y/o male with a history of persistent atrial flutter, diabetes, hypertension, hyperlipidemia, obesity, sleep apnea, stage II-III chronic kidney disease, depression, chronic back pain, spinal stenosis, osteoarthritis, TIA, and peripheral neuropathy.  He was initially diagnosed with atrial flutter in March 2014, and subsequently underwent cardioversion.  He was hospitalized in June 2021 with recurrent atrial flutter and required cardioversion in September 2021.  Echocardiogram at that time showed an EF of 60-65% with mild LVH, and moderately dilated left atrium.  He has since been managed with amiodarone  and Eliquis .  In October 2024, he was admitted with cough, congestion, and community-acquired pneumonia.  ECG notable for rate controlled atrial flutter.  CTA of the chest was negative for PE, but did show bilateral lower lobe and right middle lobe airspace opacities concerning for edema versus pneumonia.  He was treated with intravenous antibiotics and subsequently discharged in atrial flutter with subsequent 20 pound weight gain and development of significant lower extremity and scrotal edema.  Outpatient diuretics were adjusted in December 2024 with resultant rise in creatinine necessitating reduction of torsemide  to 20 mg daily.  Amiodarone  was increased to 200 mg twice daily in December 2024 in the setting of ongoing atrial flutter with plan for cardioversion however, patient canceled multiple appointments and cardioversion was delayed until late January 2025.  A  post cardioversion echocardiogram showed normal LV function with mildly dilated left atrium and aortic valve sclerosis.  Mr. Range was seen in clinic in mid April 2025 with report of severe chest pain and he was referred to the emergency department.  There, troponins were normal.  CT angiogram of the chest was negative for PE though coronary calcium  was noted.  Lower extremity ultrasound was negative for DVT.  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  He did have AKI with a BUN/creatinine of 44/2.32 prompting discontinuation of spironolactone and changing diuretic to torsemide  20 mg every other day.  A Lexiscan  Myoview  was performed which showed normal LV function, mild coronary calcification, and no ischemia or infarct, and he was discharged home.  He was subsequently treated for pneumonia in May 2025.  In July 2025, he was seen with presyncope.  Event monitor showed predominantly sinus rhythm with an average heart rate of 69 bpm with rare PACs  and PVCs, and no triggered events.  In December 2025, he was seen in clinic with palpitations and dyspnea.  He was found to be in atrial flutter at 100 bpm.  He subsequently underwent successful cardioversion (150 J) on January 03, 2025.  He had worsening dyspnea following his cardioversion was admitted January 9 with findings of CHF on chest x-ray and hypoxia with saturations at 74% on room air.  Initial chest x-ray concerning for pneumonia though he was afebrile and oxygen  saturations and symptoms improved with diuresis alone.  While he was hospitalized, he did complain of chest soreness, which was felt to be secondary to cardioversion.  Troponin was minimally elevated at 23.  ECG on January 11 showed A-fib at 120.  He was maintained on amiodarone  and appears to have converted prior to discharge.     Since hospital discharge, he has felt well.  His chronic, stable  bilateral lower extremity woody edema.  His weight has been trending in a stable fashion in the 260 to 264 pound range.  He denies any recurrent palpitations, dyspnea, PND, orthopnea, dizziness, syncope, or early satiety.  He continues to note intermittent chest wall soreness that is worse with palpation and dates back several years. Objective   Home Medications    Current Outpatient Medications  Medication Sig Dispense Refill   ASPIRIN  ADULT LOW DOSE 81 MG tablet Take 81 mg by mouth every evening.     atorvastatin  (LIPITOR) 40 MG tablet Take 1 tablet (40 mg total) by mouth daily. 30 tablet 2   docusate sodium  (COLACE) 100 MG capsule Take 100 mg by mouth See admin instructions. Twice daily on Mon, Wed, and Fri     dutasteride  (AVODART ) 0.5 MG capsule Take 0.5 mg by mouth daily.     ELIQUIS  5 MG TABS tablet TAKE 1 TABLET TWICE A DAY 180 tablet 1   FARXIGA  10 MG TABS tablet TAKE 1 TABLET BY MOUTH EVERY DAY 90 tablet 3   fexofenadine (ALLEGRA) 180 MG tablet Take 180 mg by mouth daily.     gabapentin  (NEURONTIN ) 100 MG capsule Take 100 mg by mouth daily with breakfast.     gabapentin  (NEURONTIN ) 300 MG capsule Take 1 capsule (300 mg total) by mouth at bedtime. (Patient taking differently: Take 300 mg by mouth 2 (two) times daily.) 90 capsule 1   Insulin  Glargine (BASAGLAR  KWIKPEN) 100 UNIT/ML INJECT 40 UNITS            SUBCUTANEOUSLY TWO TIMES A DAY (Patient taking differently: Inject 48 Units into the skin 2 (two) times daily.) 45 mL 1   magnesium  oxide (MAG-OX) 400 (240 Mg) MG tablet Take 1 tablet by mouth 2 (two) times daily.     metoprolol  tartrate (LOPRESSOR ) 50 MG tablet Take 50 mg by mouth 2 (two) times daily.     naloxone  (NARCAN ) nasal spray 4 mg/0.1 mL Please use in the event of overdose from narcotic medication 1 each 1   nortriptyline  (PAMELOR ) 10 MG capsule Take 30 mg by mouth at bedtime.     NOVOLOG  FLEXPEN 100 UNIT/ML FlexPen INJECT 14 UNITS            SUBCUTANEOUSLY IN THE       MORNING AND AT BEDTIME 15 mL 6   Omega-3 Fatty Acids (FISH OIL) 1200 MG CAPS Take 1,200 mg by mouth 2 (two) times daily.     ONETOUCH ULTRA test strip USE WITH METER TWICE DAILY 100 strip 6   oxyCODONE  (OXYCONTIN ) 40 mg  12 hr tablet Take 1 tablet (40 mg total) by mouth every 8 (eight) hours as needed. (Patient taking differently: Take 40 mg by mouth every 8 (eight) hours.) 90 tablet 0   polyethylene glycol (MIRALAX  / GLYCOLAX ) 17 g packet Take 17 g by mouth daily as needed for mild constipation or moderate constipation.     pregabalin  (LYRICA ) 200 MG capsule TAKE 1 CAPSULE 3 TIMES A   DAY 270 capsule 1   rOPINIRole  (REQUIP ) 1 MG tablet Take 1 mg by mouth at bedtime.     sitaGLIPtin  (JANUVIA ) 100 MG tablet Take 100 mg by mouth daily.     tamsulosin  (FLOMAX ) 0.4 MG CAPS capsule Take 0.4 mg by mouth daily.     torsemide  (DEMADEX ) 20 MG tablet Take 1 tablet (20 mg total) by mouth every other day. Take one tablet as needed for a weight gain of 2 lbs in 24 hrs or 5 lbs in a week.     amiodarone  (PACERONE ) 200 MG tablet Take 1 tablet (200 mg total) by mouth daily. 90 tablet 3   No current facility-administered medications for this visit.     Physical Exam    VS:  BP 136/62   Pulse 64   Ht 5' 10 (1.778 m)   Wt 267 lb (121.1 kg)   SpO2 98%   BMI 38.31 kg/m  , BMI Body mass index is 38.31 kg/m.          GEN: Well nourished, well developed, in no acute distress. HEENT: normal. Neck: Supple, no JVD, carotid bruits, or masses. Cardiac: RRR, 2/6 systolic murmur heard throughout, no rubs or gallops. No clubbing, cyanosis, 2+ bilateral woody edema extending to the lower posterior thighs.  Radials 2+/PT 2+ and equal bilaterally.  Respiratory:  Respirations regular and unlabored, clear to auscultation bilaterally. GI: Soft, nontender, nondistended, BS + x 4. MS: no deformity or atrophy. Skin: warm and dry, no rash. Neuro:  Strength and sensation are intact. Psych: Normal affect.  Accessory  Clinical Findings    ECG personally reviewed by me today - EKG Interpretation Date/Time:  Friday January 18 2025 13:33:16 EST Ventricular Rate:  64 PR Interval:  166 QRS Duration:  126 QT Interval:  442 QTC Calculation: 455 R Axis:   72  Text Interpretation: Normal sinus rhythm Non-specific intra-ventricular conduction block Confirmed by Vivienne Bruckner [phone]) on 01/18/2025 1:40:22 PM  - no acute changes.  Lab Results  Component Value Date   WBC 5.1 01/06/2025   HGB 11.0 (L) 01/06/2025   HCT 36.5 (L) 01/06/2025   MCV 79.2 (L) 01/06/2025   PLT 103 (L) 01/06/2025   Lab Results  Component Value Date   CREATININE 1.04 01/07/2025   BUN 18 01/07/2025   NA 139 01/07/2025   K 4.3 01/07/2025   CL 98 01/07/2025   CO2 35 (H) 01/07/2025   Lab Results  Component Value Date   ALT 19 05/27/2023   AST 21 05/27/2023   ALKPHOS 75 05/27/2023   BILITOT 0.6 05/27/2023   Lab Results  Component Value Date   CHOL 163 04/13/2024   HDL 27 (L) 04/13/2024   LDLCALC 97 04/13/2024   TRIG 196 (H) 04/13/2024   CHOLHDL 6.0 04/13/2024    Lab Results  Component Value Date   HGBA1C 8.8 (H) 04/12/2024   Lab Results  Component Value Date   TSH 6.660 (H) 06/08/2022       Assessment & Plan    1.  Paroxysmal atrial flutter/atrial fibrillation: History  of atrial arrhythmias dating back to 2014 with recurrent atrial flutter in late 2025 prompting cardioversion in January 2026.  He was hospitalized a day later with CHF and had recurrent atrial fibrillation during hospitalization that converted spontaneously to sinus rhythm in the setting of amiodarone  therapy.  He remains on amiodarone  200 mg daily.  He said no recurrence of atrial arrhythmia since his most recent hospitalization and is in sinus rhythm today.  He is chronic anticoagulated on Eliquis  and notes compliance.  2.  Chronic HFpEF: EF 60 to 65% with mild MR and aortic sclerosis by echo in February 2025.  In the setting of recurrent atrial  flutter, he experienced weight gain and volume excess requiring admission 1 day after his cardioversion.  He was successfully diuresed and has since been taking torsemide  20 mg every other day with an additional tablet as needed for weight gain.  He has not required any additional tablets since hospital discharge.  He did have worsening of CKD on daily torsemide  in the past.  He has chronic, already bilateral lower extremity edema, which he says is stable today.  His weight has been stable at home since hospital discharge.  He has compression socks at home but neither he nor his wife can get them on anymore and therefore he is not wearing.  He does note that keeping his legs elevated significantly reduces his swelling and I strongly encouraged him to do this.  3.  Chronic chest wall pain/coronary calcium : Patient with a long history of chest wall pain somewhat worse with palpation.  This was aggravated following cardioversion.  He had a low risk stress test in 2025.  CTA of the chest was also negative for PE at that time.  No further ischemic evaluation is warranted at this time.  Remains on statin and beta-blocker therapy.  4.  Primary hypertension: Relatively stable on beta-blocker and diuretic therapy.  Previously on ARB and calcium  channel blocker though calcium  channel blocker was discontinued due to lower extremity edema and ARB was discontinued last year due to worsening renal function.  5.  Stage III chronic kidney disease: Creatinine 1.04 at the time of hospital discharge in January 12.  6.  Dysphagia: Patient with a history of recurrent pneumonia.  On discussion today, he notes that he frequently chokes on foods and drinks.  He was previously referred for speech therapy but then this ended up being delayed because of cardiac issues.  I am placing referral for speech therapy as he would greatly benefit from further evaluation and swallow study.  7.  Type 2 diabetes mellitus: Poorly controlled with  A1c of 10.5 in November 2025.  He is on insulin  therapy and followed by primary care.  8.  Disposition: Follow-up in clinic in 1 month or sooner if necessary.  Lonni Meager, NP 01/18/2025, 2:31 PM     [1]  Allergies Allergen Reactions   Carisoprodol Itching and Nausea And Vomiting    Reaction:  Unknown   Sensitivity   Reaction:  Unknown     Reaction:  Unknown     Sensitivity     Reaction:  Unknown   "

## 2025-01-24 ENCOUNTER — Ambulatory Visit: Attending: Nurse Practitioner | Admitting: Speech Pathology

## 2025-01-24 DIAGNOSIS — R1312 Dysphagia, oropharyngeal phase: Secondary | ICD-10-CM | POA: Insufficient documentation

## 2025-01-24 DIAGNOSIS — R1310 Dysphagia, unspecified: Secondary | ICD-10-CM | POA: Diagnosis not present

## 2025-01-24 NOTE — Therapy (Unsigned)
 " OUTPATIENT SPEECH LANGUAGE PATHOLOGY  SWALLOW EVALUATION   Patient Name: Jerry Parsons MRN: [id number] DOB:[birth year], 74 y.o., male Today's Date: 01/24/2025  PCP: Charlie Forte, MD REFERRING PROVIDER: Lonni Meager, NP   End of Session - 01/24/25 1400     Visit Number 1    Number of Visits 10    Date for Recertification  04/18/25    Authorization Type Lonni Meager    Progress Note Due on Visit 10    SLP Start Time 1315    SLP Stop Time  1400    SLP Time Calculation (min) 45 min    Activity Tolerance Patient tolerated treatment well          Past Medical History:  Diagnosis Date   Arthritis    Atrial flutter (HCC)    a. 12/2023 s/p DCCV; b. 12/2024 s/p DCCV (150J).   Blind right eye    BPH (benign prostatic hyperplasia)    Chest pain    a. 03/2024 MV: EF 56%, mild cor Ca2+. No ischemia/infarct.   Chronic heart failure with preserved ejection fraction (HFpEF) (HCC)    a. 01/2024 Echo: EF 60-65%, no rwma, nl RV fxn, mildly dil LA, mild MR, AoV sclerosis w/o stenosis.   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 06/2024 Zio: avg HR of 69 bpm (49-105), rare PACs and PVCs without triggered events.   Pulmonary embolus (HCC) 2011   Seasonal allergies    Seizures (HCC)    as child    Sleep apnea    BIPAP   Stiff neck    limited turning s/p titanium plate placement   TIA (transient ischemic attack)    Wears dentures    full upper and lower   Past Surgical History:  Procedure Laterality Date   BACK SURGERY     x 8; upper x 3 & lower x 5   CARDIOVERSION   03/14/13, 10/16   2014 - [facility], 2016 - [facility]   CARDIOVERSION N/A 09/11/2020   Procedure: CARDIOVERSION;  Surgeon: Perla Evalene PARAS, MD;  Location: [facility];  Service: Cardiovascular;  Laterality: N/A;   CARDIOVERSION N/A 01/25/2024   Procedure: CARDIOVERSION;  Surgeon: Perla Evalene PARAS, MD;  Location: [facility];  Service: Cardiovascular;  Laterality: N/A;   CARDIOVERSION N/A 01/03/2025   Procedure: CARDIOVERSION;  Surgeon: Perla Evalene PARAS, MD;  Location: [facility];  Service: Cardiovascular;  Laterality: N/A;   CATARACT EXTRACTION W/PHACO Left 10/29/2015   Procedure: CATARACT EXTRACTION PHACO AND INTRAOCULAR LENS PLACEMENT (IOC);  Surgeon: Dene Etienne, MD;  Location: [facility];  Service: Ophthalmology;  Laterality: Left;  DIABETIC - insulin  and oral medsSleep apnea - no machine   CATARACT EXTRACTION W/PHACO Right 02/16/2023   Procedure: CATARACT EXTRACTION PHACO AND INTRAOCULAR LENS PLACEMENT (IOC) RIGHT DIABETIC MALYUGIN HEALON 5 VISION BLUE  65.52  03:57.7;  Surgeon: Etienne Dene, MD;  Location: [facility];  Service: Ophthalmology;  Laterality: Right;  Diabetic  CHOLECYSTECTOMY     COLONOSCOPY WITH PROPOFOL  N/A 01/16/2018   Procedure: COLONOSCOPY WITH PROPOFOL ;  Surgeon: Viktoria Lamar DASEN, MD;  Location[facility];  Service: Endoscopy;  Laterality: N/A;   ESOPHAGOGASTRODUODENOSCOPY (EGD) WITH PROPOFOL  N/A 01/16/2018   Procedure: ESOPHAGOGASTRODUODENOSCOPY (EGD) WITH PROPOFOL ;  Surgeon: Viktoria Lamar DASEN, MD;  Location: [facility];  Service: Endoscopy;  Laterality: N/A;   EYE SURGERY     GALLBLADDER SURGERY  2002   JOINT REPLACEMENT Right 2018   KNEE ARTHROSCOPY     left    PARATHYROIDECTOMY N/A 07/25/2020   Procedure: PARATHYROIDECTOMY;  Surgeon: Marolyn Nest, MD;  Location: [facility];  Service: General;  Laterality: N/A;   ROTATOR CUFF REPAIR  2001   left    TEE WITHOUT CARDIOVERSION N/A 04/26/2018   Procedure: TRANSESOPHAGEAL ECHOCARDIOGRAM (TEE);   Surgeon: Mady Bruckner, MD;  Location: [facility];  Service: Cardiovascular;  Laterality: N/A;   TONSILLECTOMY     TOTAL KNEE ARTHROPLASTY Right 06/20/2017   Procedure: RIGHT TOTAL KNEE ARTHROPLASTY;  Surgeon: Melodi Lerner, MD;  Location: [facility];  Service: Orthopedics;  Laterality: Right;   Patient Active Problem List   Diagnosis Date Noted   Acute on chronic diastolic congestive heart failure (HCC) 01/04/2025   Type II diabetes mellitus with renal manifestations (HCC) 01/04/2025   Chronic kidney disease, stage 3a (HCC) 01/04/2025   Unstable angina (HCC) 04/14/2024   Coronary artery disease of native artery of native heart with stable angina pectoris 04/14/2024   Chest pain 04/12/2024   HTN (hypertension) 04/12/2024   TIA (transient ischemic attack) 04/12/2024   Depression 04/12/2024   Obesity (BMI 30-39.9) 04/12/2024   DVT (deep venous thrombosis) (HCC) 04/12/2024   Hx of pulmonary embolus 04/12/2024   Thrombocytopenia 04/12/2024   Hyperkalemia 04/12/2024   Acute renal failure superimposed on stage 3a chronic kidney disease (HCC) 04/12/2024   Positive D dimer 04/12/2024   CAP (community acquired pneumonia) 10/13/2023   Acute respiratory failure with hypoxia (HCC) 10/13/2023   GERD without esophagitis 10/13/2023   Olecranon bursitis of right elbow 12/29/2022   Type 2 diabetes mellitus with diabetic polyneuropathy, with long-term current use of insulin  (HCC) 12/28/2022   Respiratory tract infection 12/17/2021   Aortic atherosclerosis 10/09/2021   Carpal tunnel syndrome of left wrist 09/01/2021   Acquired trigger finger of left middle finger 05/28/2021   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   Primary hyperparathyroidism    Osteoarthritis of both knees 11/11/2018   OSA on CPAP 10/14/2018   Lumbar spondylosis 05/05/2018   Spinal stenosis of lumbar region 05/05/2018   Constipation, chronic 03/08/2018   Pain in limb 03/03/2018   Gastroesophageal reflux disease without esophagitis  01/04/2018   OA (osteoarthritis) of knee 06/20/2017   Sepsis due to pneumonia (HCC) 05/07/2017   Neuropathy 10/13/2016   Amblyopia 12/30/2015   Cornea scar 12/30/2015   NS (nuclear sclerosis) 12/30/2015   Pseudophakia 12/30/2015   Dehydration 09/08/2015   Aspiration pneumonia (HCC) 07/04/2015   Paroxysmal atrial fibrillation (HCC) 07/04/2015   Arthritis of knee, degenerative 05/28/2015   Chronic back pain 09/17/2014   Leg edema 05/07/2014   Chronic diastolic CHF (congestive heart failure) (HCC) 05/07/2014   Campylobacter diarrhea 04/25/2013   Atrial flutter (HCC) 01/23/2013   Obesity 05/19/2012   Hyperlipidemia 11/11/2011   Diastolic dysfunction 04/12/2011   SOB (shortness of breath) 04/12/2011   Diabetes mellitus type 2, uncontrolled, with complications 03/15/2011   HYPERTENSION, BENIGN 03/15/2011   Acute thromboembolism of deep veins of lower extremity (HCC) 03/15/2011   Symptoms  involving cardiovascular system 03/15/2011    ONSET DATE: Many years; date of referral  01/18/2025  REFERRING DIAG: R13.10 (ICD-10-CM) - Dysphagia, unspecified type  THERAPY DIAG:  Dysphagia, oropharyngeal phase  Rationale for Evaluation and Treatment Rehabilitation  SUBJECTIVE:   SUBJECTIVE STATEMENT: Pt pleasant, eager, good historian regarding symptoms Pt accompanied by: significant other  PERTINENT HISTORY and DIAGNOSTIC FINDINGS: Dysphagia: Patient with a history of recurrent pneumonia. On discussion today, he notes that he frequently chokes on foods and drinks. He was previously referred for speech therapy but then this ended up being delayed because of cardiac issues  01/04/2025 DG Chest: Right midlung pneumonia  04/24/2018 Pt appears to present w/ mild-moderate oropharyngeal phase dysphagia w/ laryngeal Penetration and slight-min Aspiration noted of thiln iquids during this study  This aspiration was SILENT. Pt was cued to use strategies including Chin Tuck, f/u dry swallow, and Throat  Clear/Cough post trials of liquids moreso.  Dysphagia, oropharyngeal phase (R13.12)  PAIN:  Are you having pain? No  FALLS: Has patient fallen in last 6 months?  No  LIVING ENVIRONMENT: Lives with: lives with their spouse Lives in: House/apartment  PLOF:  Level of assistance: Independent with ADLs, Independent with IADLs Employment: Retired   PATIENT GOALS  to reduce coughing and choking when eating   OBJECTIVE:  RECOMMENDATIONS FROM OBJECTIVE SWALLOW STUDY (MBSS/FEES):   Objective swallow impairments: 04/24/2018 Pt appears to present w/ mild-moderate oropharyngeal phase dysphagia w/ laryngeal Penetration and slight-min Aspiration noted of thiln iquids during this study  This aspiration was SILENT. Pt was cued to use strategies including Chin Tuck, f/u dry swallow, and Throat Clear/Cough post trials of liquids moreso.   COGNITION: Overall cognitive status: Within functional limits for tasks assessed  ORAL MOTOR EXAMINATION Facial : WFL Lingual: WFL Velum: WFL Mandible: WFL Cough: WFL Voice: WFL   CLINICAL SWALLOW ASSESSMENT:   Current diet: regular and thin liquids Dentition: dentures (top) and edentulous bottom Feeding: able to feed self Consistencies tested: Thin Liquid: Presentation: Cup and Self-fed Oral Phase: WFL Pharyngeal Phase: WFL Puree: Presentation: Spoon and Self-fed Oral Phase: WFL Pharyngeal Phase: WFL Regular: Presentation: By hand Oral Phase: WFL Pharyngeal Phase: WFL   Evaluation findings: Patient presents with oropharyngeal swallow which appears clinically to be within functional limits with adequate airway protection. Oral stage is characterized by appearance of adequate oral containment, mastication, bolus formation, oral transfer and oral clearance. Swallow initiation appears timely. No overt signs of aspiration observed despite challenging with consecutive straw sips of thin liquids in excess of 3oz.  Aspiration risk factors:History of  pneumonia, History of dysphagia, History of GERD, History of esophageal-related issues, and Deconditioning Overall aspiration risk:Minimal and Moderate Diet Recommendations: regular and thin liquids Precautions:Minimize environmental distractions, Slow rate, Small sips/bites, Seated upright 90 degrees, and Remain upright for at least 30 minutes after meals Supervision: Patient able to feed self Oral care recommendations:Oral care BID and Other take out dentures at night, do not sleep with dentures in Follow-up recommendations: MBSS and Therapy as outlined in treatment plan below     PATIENT REPORTED OUTCOME MEASURES (PROM): Reflux Symptom Index These are statements many people have used to describe their voices and the effects of their voices on their lives. In the last  month, how did the following problems affect you?   Hoarseness or a problem with your voice 1=Very mild problem  Clearing your throat 2=Moderate or slight problem  Excess throat mucous or postnasal drip 3+Moderate problem  Difficulty swallowing food, liquids, or pills  3+Moderate problem  Coughing after you eat or after lying down 3+Moderate problem  Breathing difficulties or choking episodes 3+Moderate problem  Troublesome or annoying cough 2=Moderate or slight problem  Sensation of something sticking in your throat or a lump in your throat 3+Moderate problem  Heartburn, chest pain, indigestion, or stomach acid coming up 4=Severe problem   Total: 24    Normative data suggests that an RSI of greater than or equal to 13 is clinically significant  Therefore, an RSI > 13 may be indicative of significant reflux disease.    EATING ASSESSMENT TOOL (EAT-10)   The patient was asked to rate to what extent the following statements are problematic on a scale of 0-4. 0 = No problem; 4 = Severe problem. A total score of 3 or higher is considered abnormal.  1.) My swallowing problem has caused me to lose weight. 2 2.) My  swallowing problem interferes with my ability to go out for meals. 0 3.) Swallowing liquids takes extra effort. 3 4.) Swallowing solids takes extra effort. 1  5.) Swallowing pills takes extra effort. 3 6.) Swallowing is painful. 0 7.) The pleasure of eating is affected by my swallowing. 0 8.) When I swallow food sticks in my throat. 0 9.) I cough when I eat. 1 10.) Swallowing is stressful. 1   TOTAL SCORE: 11    TODAY'S TREATMENT:  ***   PATIENT EDUCATION: Education details: *** Person educated: Patient and Spouse Education method: Explanation Education comprehension: needs further education  HOME EXERCISE PROGRAM:     ***   GOALS: Goals reviewed with patient? Yes  SHORT TERM GOALS: Target date: 10 sessions  *** Baseline: Goal status: {GOALSTATUS:25110}  2.  *** Baseline:  Goal status: {GOALSTATUS:25110}  3.  *** Baseline:  Goal status: {GOALSTATUS:25110}  4.  *** Baseline:  Goal status: {GOALSTATUS:25110}  5.  *** Baseline:  Goal status: {GOALSTATUS:25110}  6.  *** Baseline:  Goal status: {GOALSTATUS:25110}  LONG TERM GOALS: Target date: 04/18/2025  *** Baseline:  Goal status: {GOALSTATUS:25110}  2.  *** Baseline:  Goal status: {GOALSTATUS:25110}  3.  *** Baseline:  Goal status: {GOALSTATUS:25110}  4.  *** Baseline:  Goal status: {GOALSTATUS:25110}  5.  *** Baseline:  Goal status: {GOALSTATUS:25110}  6.  *** Baseline:  Goal status: {GOALSTATUS:25110}  ASSESSMENT:  CLINICAL IMPRESSION: Patient is a *** y.o. *** who was seen today for ***.   OBJECTIVE IMPAIRMENTS include {SLPOBJIMP:27107}. These impairments are limiting patient from {SLPLIMIT:27108}. Factors affecting potential to achieve goals and functional outcome are {SLP factors:25450}. Patient will benefit from skilled SLP services to address above impairments and improve overall function.  REHAB POTENTIAL: {rehabpotential:25112}  PLAN: SLP FREQUENCY: {rehab  frequency:25116}  SLP DURATION: {rehab duration:25117}  PLANNED INTERVENTIONS: {SLP treatment/interventions:25449}    Autrey Human B. Rubbie, M.S., CCC-SLP, CBIS Speech-Language Pathologist Certified Brain Injury Specialist [facility]  [facility] [phone] Ascom [phone] Fax [phone]     "

## 2025-02-05 ENCOUNTER — Encounter: Admitting: Physician Assistant

## 2025-02-15 ENCOUNTER — Ambulatory Visit: Admitting: Nurse Practitioner

## 2025-02-21 ENCOUNTER — Ambulatory Visit: Admitting: Podiatry
